# Patient Record
Sex: Female | Born: 1935
Health system: Southern US, Community
[De-identification: ages and names within clinical notes are randomized; demographics above are authoritative.]

## PROBLEM LIST (undated history)

## (undated) DIAGNOSIS — I2699 Other pulmonary embolism without acute cor pulmonale: Secondary | ICD-10-CM

## (undated) DIAGNOSIS — N179 Acute kidney failure, unspecified: Secondary | ICD-10-CM

## (undated) DIAGNOSIS — R1900 Intra-abdominal and pelvic swelling, mass and lump, unspecified site: Secondary | ICD-10-CM

## (undated) DIAGNOSIS — I82409 Acute embolism and thrombosis of unspecified deep veins of unspecified lower extremity: Secondary | ICD-10-CM

## (undated) DIAGNOSIS — A048 Other specified bacterial intestinal infections: Secondary | ICD-10-CM

## (undated) DIAGNOSIS — D509 Iron deficiency anemia, unspecified: Secondary | ICD-10-CM

## (undated) DIAGNOSIS — Z9889 Other specified postprocedural states: Secondary | ICD-10-CM

## (undated) DIAGNOSIS — I4819 Other persistent atrial fibrillation: Secondary | ICD-10-CM

## (undated) DIAGNOSIS — E785 Hyperlipidemia, unspecified: Secondary | ICD-10-CM

## (undated) DIAGNOSIS — C4490 Unspecified malignant neoplasm of skin, unspecified: Secondary | ICD-10-CM

## (undated) DIAGNOSIS — I4891 Unspecified atrial fibrillation: Secondary | ICD-10-CM

## (undated) DIAGNOSIS — I639 Cerebral infarction, unspecified: Secondary | ICD-10-CM

## (undated) DIAGNOSIS — K219 Gastro-esophageal reflux disease without esophagitis: Secondary | ICD-10-CM

## (undated) DIAGNOSIS — M1990 Unspecified osteoarthritis, unspecified site: Secondary | ICD-10-CM

## (undated) DIAGNOSIS — K5790 Diverticulosis of intestine, part unspecified, without perforation or abscess without bleeding: Secondary | ICD-10-CM

## (undated) DIAGNOSIS — K635 Polyp of colon: Secondary | ICD-10-CM

## (undated) DIAGNOSIS — K5190 Ulcerative colitis, unspecified, without complications: Secondary | ICD-10-CM

## (undated) DIAGNOSIS — R059 Cough, unspecified: Secondary | ICD-10-CM

## (undated) DIAGNOSIS — I10 Essential (primary) hypertension: Secondary | ICD-10-CM

## (undated) DIAGNOSIS — E039 Hypothyroidism, unspecified: Secondary | ICD-10-CM

## (undated) DIAGNOSIS — R05 Cough: Secondary | ICD-10-CM

## (undated) DIAGNOSIS — Z8719 Personal history of other diseases of the digestive system: Secondary | ICD-10-CM

## (undated) HISTORY — DX: Unspecified osteoarthritis, unspecified site: M19.90

## (undated) HISTORY — DX: Intra-abdominal and pelvic swelling, mass and lump, unspecified site: R19.00

## (undated) HISTORY — DX: Hyperlipidemia, unspecified: E78.5

## (undated) HISTORY — DX: Cerebral infarction, unspecified: I63.9

## (undated) HISTORY — PX: ORIF ANKLE FRACTURE: SUR919

## (undated) HISTORY — DX: Unspecified atrial fibrillation: I48.91

## (undated) HISTORY — DX: Gastro-esophageal reflux disease without esophagitis: K21.9

## (undated) HISTORY — DX: Iron deficiency anemia, unspecified: D50.9

## (undated) HISTORY — PX: REPLACEMENT TOTAL KNEE: SUR1224

## (undated) HISTORY — PX: OTHER SURGICAL HISTORY: SHX169

## (undated) HISTORY — DX: Polyp of colon: K63.5

## (undated) HISTORY — DX: Essential (primary) hypertension: I10

## (undated) HISTORY — PX: DILATION AND CURETTAGE OF UTERUS: SHX78

## (undated) HISTORY — DX: Other specified bacterial intestinal infections: A04.8

## (undated) HISTORY — DX: Diverticulosis of intestine, part unspecified, without perforation or abscess without bleeding: K57.90

## (undated) HISTORY — DX: Acute embolism and thrombosis of unspecified deep veins of unspecified lower extremity: I82.409

## (undated) HISTORY — DX: Other persistent atrial fibrillation: I48.19

## (undated) HISTORY — DX: Other pulmonary embolism without acute cor pulmonale: I26.99

## (undated) HISTORY — DX: Other specified postprocedural states: Z98.890

## (undated) HISTORY — PX: TONSILLECTOMY: SUR1361

## (undated) HISTORY — DX: Personal history of other diseases of the digestive system: Z87.19

## (undated) HISTORY — PX: APPENDECTOMY: SHX54

## (undated) HISTORY — PX: BREAST LUMPECTOMY: SHX2

## (undated) HISTORY — DX: Unspecified malignant neoplasm of skin, unspecified: C44.90

## (undated) HISTORY — PX: JOINT REPLACEMENT: SHX530

---

## 1990-02-11 DIAGNOSIS — Z8719 Personal history of other diseases of the digestive system: Secondary | ICD-10-CM

## 1990-02-11 HISTORY — PX: BREAST EXCISIONAL BIOPSY: SUR124

## 1990-02-11 HISTORY — DX: Personal history of other diseases of the digestive system: Z87.19

## 1996-01-20 DIAGNOSIS — A048 Other specified bacterial intestinal infections: Secondary | ICD-10-CM

## 1996-01-20 HISTORY — DX: Other specified bacterial intestinal infections: A04.8

## 1997-07-11 ENCOUNTER — Encounter: Admission: RE | Admit: 1997-07-11 | Discharge: 1997-07-11 | Payer: Self-pay | Admitting: Sports Medicine

## 1997-07-14 ENCOUNTER — Encounter: Admission: RE | Admit: 1997-07-14 | Discharge: 1997-07-14 | Payer: Self-pay | Admitting: Family Medicine

## 1997-07-14 ENCOUNTER — Ambulatory Visit: Admission: RE | Admit: 1997-07-14 | Discharge: 1997-07-14 | Payer: Self-pay | Admitting: Family Medicine

## 1998-02-15 ENCOUNTER — Ambulatory Visit (HOSPITAL_COMMUNITY): Admission: RE | Admit: 1998-02-15 | Discharge: 1998-02-15 | Payer: Self-pay

## 1998-02-20 ENCOUNTER — Encounter: Admission: RE | Admit: 1998-02-20 | Discharge: 1998-02-20 | Payer: Self-pay | Admitting: Family Medicine

## 1998-08-14 ENCOUNTER — Encounter: Admission: RE | Admit: 1998-08-14 | Discharge: 1998-08-14 | Payer: Self-pay | Admitting: Family Medicine

## 1998-11-10 ENCOUNTER — Encounter: Admission: RE | Admit: 1998-11-10 | Discharge: 1998-11-10 | Payer: Self-pay | Admitting: Family Medicine

## 1998-11-10 ENCOUNTER — Other Ambulatory Visit: Admission: RE | Admit: 1998-11-10 | Discharge: 1998-11-10 | Payer: Self-pay | Admitting: Family Medicine

## 1999-01-12 ENCOUNTER — Encounter: Admission: RE | Admit: 1999-01-12 | Discharge: 1999-01-12 | Payer: Self-pay | Admitting: Family Medicine

## 1999-02-26 ENCOUNTER — Encounter: Admission: RE | Admit: 1999-02-26 | Discharge: 1999-02-26 | Payer: Self-pay | Admitting: Family Medicine

## 1999-02-27 ENCOUNTER — Ambulatory Visit (HOSPITAL_COMMUNITY): Admission: RE | Admit: 1999-02-27 | Discharge: 1999-02-27 | Payer: Self-pay | Admitting: Family Medicine

## 1999-05-29 ENCOUNTER — Encounter: Payer: Self-pay | Admitting: Family Medicine

## 1999-05-29 ENCOUNTER — Ambulatory Visit (HOSPITAL_COMMUNITY): Admission: RE | Admit: 1999-05-29 | Discharge: 1999-05-29 | Payer: Self-pay | Admitting: Family Medicine

## 1999-07-05 ENCOUNTER — Ambulatory Visit (HOSPITAL_COMMUNITY): Admission: RE | Admit: 1999-07-05 | Discharge: 1999-07-05 | Payer: Self-pay | Admitting: Obstetrics and Gynecology

## 1999-07-05 ENCOUNTER — Encounter (INDEPENDENT_AMBULATORY_CARE_PROVIDER_SITE_OTHER): Payer: Self-pay | Admitting: Specialist

## 1999-09-11 ENCOUNTER — Encounter: Admission: RE | Admit: 1999-09-11 | Discharge: 1999-09-11 | Payer: Self-pay | Admitting: Family Medicine

## 1999-09-17 ENCOUNTER — Encounter: Admission: RE | Admit: 1999-09-17 | Discharge: 1999-09-17 | Payer: Self-pay | Admitting: Family Medicine

## 1999-09-19 ENCOUNTER — Encounter: Admission: RE | Admit: 1999-09-19 | Discharge: 1999-09-19 | Payer: Self-pay | Admitting: Family Medicine

## 1999-09-24 ENCOUNTER — Encounter: Admission: RE | Admit: 1999-09-24 | Discharge: 1999-09-24 | Payer: Self-pay | Admitting: Family Medicine

## 1999-10-08 ENCOUNTER — Encounter: Admission: RE | Admit: 1999-10-08 | Discharge: 1999-10-08 | Payer: Self-pay | Admitting: Family Medicine

## 2000-01-21 ENCOUNTER — Encounter: Admission: RE | Admit: 2000-01-21 | Discharge: 2000-01-21 | Payer: Self-pay | Admitting: Family Medicine

## 2000-02-29 ENCOUNTER — Encounter: Admission: RE | Admit: 2000-02-29 | Discharge: 2000-02-29 | Payer: Self-pay | Admitting: Family Medicine

## 2000-02-29 ENCOUNTER — Encounter: Payer: Self-pay | Admitting: Family Medicine

## 2000-02-29 ENCOUNTER — Ambulatory Visit (HOSPITAL_COMMUNITY): Admission: RE | Admit: 2000-02-29 | Discharge: 2000-02-29 | Payer: Self-pay | Admitting: Family Medicine

## 2000-03-13 ENCOUNTER — Encounter: Admission: RE | Admit: 2000-03-13 | Discharge: 2000-03-13 | Payer: Self-pay | Admitting: Family Medicine

## 2000-04-11 ENCOUNTER — Encounter: Admission: RE | Admit: 2000-04-11 | Discharge: 2000-04-11 | Payer: Self-pay | Admitting: Family Medicine

## 2000-06-04 ENCOUNTER — Encounter: Admission: RE | Admit: 2000-06-04 | Discharge: 2000-06-04 | Payer: Self-pay | Admitting: Family Medicine

## 2000-06-17 ENCOUNTER — Encounter: Admission: RE | Admit: 2000-06-17 | Discharge: 2000-06-17 | Payer: Self-pay | Admitting: Family Medicine

## 2000-07-15 ENCOUNTER — Ambulatory Visit (HOSPITAL_COMMUNITY): Admission: RE | Admit: 2000-07-15 | Discharge: 2000-07-15 | Payer: Self-pay | Admitting: Family Medicine

## 2000-07-15 ENCOUNTER — Ambulatory Visit (HOSPITAL_COMMUNITY): Admission: RE | Admit: 2000-07-15 | Discharge: 2000-07-15 | Payer: Self-pay | Admitting: *Deleted

## 2000-07-15 ENCOUNTER — Inpatient Hospital Stay (HOSPITAL_COMMUNITY): Admission: EM | Admit: 2000-07-15 | Discharge: 2000-07-21 | Payer: Self-pay | Admitting: Emergency Medicine

## 2000-07-15 ENCOUNTER — Encounter: Admission: RE | Admit: 2000-07-15 | Discharge: 2000-07-15 | Payer: Self-pay | Admitting: Family Medicine

## 2000-07-15 ENCOUNTER — Encounter: Payer: Self-pay | Admitting: Family Medicine

## 2000-07-17 ENCOUNTER — Encounter: Payer: Self-pay | Admitting: Family Medicine

## 2000-07-22 ENCOUNTER — Encounter: Admission: RE | Admit: 2000-07-22 | Discharge: 2000-07-22 | Payer: Self-pay | Admitting: Family Medicine

## 2000-07-24 ENCOUNTER — Encounter: Admission: RE | Admit: 2000-07-24 | Discharge: 2000-07-24 | Payer: Self-pay | Admitting: Family Medicine

## 2000-07-31 ENCOUNTER — Encounter: Admission: RE | Admit: 2000-07-31 | Discharge: 2000-07-31 | Payer: Self-pay | Admitting: Sports Medicine

## 2000-08-07 ENCOUNTER — Encounter: Admission: RE | Admit: 2000-08-07 | Discharge: 2000-08-07 | Payer: Self-pay | Admitting: Sports Medicine

## 2000-08-21 ENCOUNTER — Encounter: Admission: RE | Admit: 2000-08-21 | Discharge: 2000-08-21 | Payer: Self-pay | Admitting: Family Medicine

## 2000-08-29 ENCOUNTER — Encounter: Admission: RE | Admit: 2000-08-29 | Discharge: 2000-08-29 | Payer: Self-pay | Admitting: Family Medicine

## 2000-09-12 ENCOUNTER — Encounter: Admission: RE | Admit: 2000-09-12 | Discharge: 2000-09-12 | Payer: Self-pay | Admitting: Family Medicine

## 2000-10-10 ENCOUNTER — Encounter: Admission: RE | Admit: 2000-10-10 | Discharge: 2000-10-10 | Payer: Self-pay | Admitting: Family Medicine

## 2000-10-20 ENCOUNTER — Encounter: Admission: RE | Admit: 2000-10-20 | Discharge: 2000-10-20 | Payer: Self-pay | Admitting: Family Medicine

## 2000-11-03 ENCOUNTER — Encounter: Admission: RE | Admit: 2000-11-03 | Discharge: 2000-11-03 | Payer: Self-pay | Admitting: Family Medicine

## 2000-12-01 ENCOUNTER — Encounter: Admission: RE | Admit: 2000-12-01 | Discharge: 2000-12-01 | Payer: Self-pay | Admitting: Family Medicine

## 2000-12-29 ENCOUNTER — Encounter: Admission: RE | Admit: 2000-12-29 | Discharge: 2000-12-29 | Payer: Self-pay | Admitting: Family Medicine

## 2001-01-23 ENCOUNTER — Encounter: Admission: RE | Admit: 2001-01-23 | Discharge: 2001-01-23 | Payer: Self-pay | Admitting: Family Medicine

## 2001-01-28 ENCOUNTER — Ambulatory Visit (HOSPITAL_COMMUNITY): Admission: RE | Admit: 2001-01-28 | Discharge: 2001-01-28 | Payer: Self-pay | Admitting: Internal Medicine

## 2001-02-27 ENCOUNTER — Encounter: Admission: RE | Admit: 2001-02-27 | Discharge: 2001-02-27 | Payer: Self-pay | Admitting: Family Medicine

## 2001-03-02 ENCOUNTER — Ambulatory Visit (HOSPITAL_COMMUNITY): Admission: RE | Admit: 2001-03-02 | Discharge: 2001-03-02 | Payer: Self-pay | Admitting: Family Medicine

## 2001-03-02 ENCOUNTER — Encounter: Payer: Self-pay | Admitting: Family Medicine

## 2001-06-08 ENCOUNTER — Encounter: Admission: RE | Admit: 2001-06-08 | Discharge: 2001-06-08 | Payer: Self-pay | Admitting: Family Medicine

## 2001-11-24 ENCOUNTER — Encounter: Admission: RE | Admit: 2001-11-24 | Discharge: 2001-11-24 | Payer: Self-pay | Admitting: Family Medicine

## 2002-03-01 ENCOUNTER — Encounter: Admission: RE | Admit: 2002-03-01 | Discharge: 2002-03-01 | Payer: Self-pay | Admitting: Family Medicine

## 2002-03-03 ENCOUNTER — Encounter: Payer: Self-pay | Admitting: Family Medicine

## 2002-03-03 ENCOUNTER — Encounter: Admission: RE | Admit: 2002-03-03 | Discharge: 2002-03-03 | Payer: Self-pay | Admitting: Family Medicine

## 2002-06-11 ENCOUNTER — Encounter: Admission: RE | Admit: 2002-06-11 | Discharge: 2002-06-11 | Payer: Self-pay | Admitting: Family Medicine

## 2002-06-28 ENCOUNTER — Encounter: Admission: RE | Admit: 2002-06-28 | Discharge: 2002-06-28 | Payer: Self-pay | Admitting: Family Medicine

## 2002-09-28 ENCOUNTER — Encounter: Admission: RE | Admit: 2002-09-28 | Discharge: 2002-09-28 | Payer: Self-pay | Admitting: Family Medicine

## 2002-12-08 ENCOUNTER — Encounter: Admission: RE | Admit: 2002-12-08 | Discharge: 2002-12-08 | Payer: Self-pay | Admitting: Family Medicine

## 2003-02-12 ENCOUNTER — Encounter (INDEPENDENT_AMBULATORY_CARE_PROVIDER_SITE_OTHER): Payer: Self-pay | Admitting: *Deleted

## 2003-02-12 LAB — CONVERTED CEMR LAB

## 2003-03-04 ENCOUNTER — Encounter: Admission: RE | Admit: 2003-03-04 | Discharge: 2003-03-04 | Payer: Self-pay | Admitting: Family Medicine

## 2003-03-07 ENCOUNTER — Encounter: Admission: RE | Admit: 2003-03-07 | Discharge: 2003-03-07 | Payer: Self-pay | Admitting: Family Medicine

## 2003-04-25 ENCOUNTER — Encounter: Admission: RE | Admit: 2003-04-25 | Discharge: 2003-04-25 | Payer: Self-pay | Admitting: Family Medicine

## 2003-05-13 ENCOUNTER — Encounter: Admission: RE | Admit: 2003-05-13 | Discharge: 2003-05-13 | Payer: Self-pay | Admitting: Family Medicine

## 2003-11-29 ENCOUNTER — Ambulatory Visit: Payer: Self-pay | Admitting: Family Medicine

## 2004-03-05 ENCOUNTER — Ambulatory Visit: Payer: Self-pay | Admitting: Family Medicine

## 2004-03-07 ENCOUNTER — Encounter: Admission: RE | Admit: 2004-03-07 | Discharge: 2004-03-07 | Payer: Self-pay | Admitting: Family Medicine

## 2004-08-21 ENCOUNTER — Ambulatory Visit: Payer: Self-pay | Admitting: Sports Medicine

## 2004-11-26 ENCOUNTER — Ambulatory Visit: Payer: Self-pay | Admitting: Family Medicine

## 2005-02-26 ENCOUNTER — Ambulatory Visit: Payer: Self-pay | Admitting: Family Medicine

## 2005-03-25 ENCOUNTER — Ambulatory Visit: Payer: Self-pay | Admitting: Family Medicine

## 2005-03-25 ENCOUNTER — Ambulatory Visit (HOSPITAL_COMMUNITY): Admission: RE | Admit: 2005-03-25 | Discharge: 2005-03-25 | Payer: Self-pay | Admitting: Family Medicine

## 2005-03-25 ENCOUNTER — Encounter: Admission: RE | Admit: 2005-03-25 | Discharge: 2005-03-25 | Payer: Self-pay | Admitting: Family Medicine

## 2005-04-15 ENCOUNTER — Ambulatory Visit: Payer: Self-pay | Admitting: Sports Medicine

## 2005-05-01 ENCOUNTER — Inpatient Hospital Stay (HOSPITAL_COMMUNITY): Admission: RE | Admit: 2005-05-01 | Discharge: 2005-05-04 | Payer: Self-pay | Admitting: Orthopedic Surgery

## 2005-11-21 ENCOUNTER — Ambulatory Visit: Payer: Self-pay | Admitting: Family Medicine

## 2005-12-31 ENCOUNTER — Ambulatory Visit: Payer: Self-pay | Admitting: Internal Medicine

## 2006-01-13 ENCOUNTER — Encounter (INDEPENDENT_AMBULATORY_CARE_PROVIDER_SITE_OTHER): Payer: Self-pay | Admitting: *Deleted

## 2006-01-13 ENCOUNTER — Ambulatory Visit: Payer: Self-pay | Admitting: Internal Medicine

## 2006-03-31 ENCOUNTER — Ambulatory Visit: Payer: Self-pay | Admitting: Family Medicine

## 2006-03-31 LAB — CONVERTED CEMR LAB
ALT: 19 units/L (ref 0–35)
AST: 22 units/L (ref 0–37)
Albumin: 4.4 g/dL (ref 3.5–5.2)
Alkaline Phosphatase: 44 units/L (ref 39–117)
BUN: 19 mg/dL (ref 6–23)
CO2: 25 meq/L (ref 19–32)
Calcium: 9.2 mg/dL (ref 8.4–10.5)
Chloride: 98 meq/L (ref 96–112)
Cholesterol: 197 mg/dL (ref 0–200)
Creatinine, Ser: 0.9 mg/dL (ref 0.40–1.20)
Glucose, Bld: 103 mg/dL — ABNORMAL HIGH (ref 70–99)
HDL: 59 mg/dL (ref >39)
LDL Cholesterol: 93 mg/dL (ref 0–99)
Potassium: 3.6 meq/L (ref 3.5–5.3)
Sodium: 135 meq/L (ref 135–145)
Total Bilirubin: 0.4 mg/dL (ref 0.3–1.2)
Total CHOL/HDL Ratio: 3.3
Total Protein: 7.3 g/dL (ref 6.0–8.3)
Triglycerides: 225 mg/dL — ABNORMAL HIGH (ref <150)
VLDL: 45 mg/dL — ABNORMAL HIGH (ref 0–40)

## 2006-04-01 ENCOUNTER — Encounter: Admission: RE | Admit: 2006-04-01 | Discharge: 2006-04-01 | Payer: Self-pay | Admitting: Family Medicine

## 2006-04-10 DIAGNOSIS — I10 Essential (primary) hypertension: Secondary | ICD-10-CM | POA: Insufficient documentation

## 2006-04-10 DIAGNOSIS — D126 Benign neoplasm of colon, unspecified: Secondary | ICD-10-CM | POA: Insufficient documentation

## 2006-04-10 DIAGNOSIS — L570 Actinic keratosis: Secondary | ICD-10-CM | POA: Insufficient documentation

## 2006-04-10 DIAGNOSIS — E7800 Pure hypercholesterolemia, unspecified: Secondary | ICD-10-CM | POA: Insufficient documentation

## 2006-04-10 DIAGNOSIS — M899 Disorder of bone, unspecified: Secondary | ICD-10-CM | POA: Insufficient documentation

## 2006-04-10 DIAGNOSIS — K210 Gastro-esophageal reflux disease with esophagitis: Secondary | ICD-10-CM

## 2006-04-10 DIAGNOSIS — Z86711 Personal history of pulmonary embolism: Secondary | ICD-10-CM | POA: Insufficient documentation

## 2006-04-10 DIAGNOSIS — K5730 Diverticulosis of large intestine without perforation or abscess without bleeding: Secondary | ICD-10-CM | POA: Insufficient documentation

## 2006-04-10 DIAGNOSIS — M949 Disorder of cartilage, unspecified: Secondary | ICD-10-CM

## 2006-04-10 DIAGNOSIS — M159 Polyosteoarthritis, unspecified: Secondary | ICD-10-CM | POA: Insufficient documentation

## 2006-04-11 ENCOUNTER — Encounter (INDEPENDENT_AMBULATORY_CARE_PROVIDER_SITE_OTHER): Payer: Self-pay | Admitting: *Deleted

## 2006-05-08 ENCOUNTER — Ambulatory Visit: Payer: Self-pay | Admitting: Family Medicine

## 2006-05-08 ENCOUNTER — Encounter: Payer: Self-pay | Admitting: Family Medicine

## 2006-05-08 ENCOUNTER — Telehealth (INDEPENDENT_AMBULATORY_CARE_PROVIDER_SITE_OTHER): Payer: Self-pay | Admitting: *Deleted

## 2006-05-26 ENCOUNTER — Telehealth: Payer: Self-pay | Admitting: Family Medicine

## 2006-11-26 ENCOUNTER — Ambulatory Visit: Payer: Self-pay | Admitting: Family Medicine

## 2007-02-12 DIAGNOSIS — I2699 Other pulmonary embolism without acute cor pulmonale: Secondary | ICD-10-CM

## 2007-02-12 DIAGNOSIS — I82409 Acute embolism and thrombosis of unspecified deep veins of unspecified lower extremity: Secondary | ICD-10-CM

## 2007-02-12 HISTORY — DX: Acute embolism and thrombosis of unspecified deep veins of unspecified lower extremity: I82.409

## 2007-02-12 HISTORY — DX: Other pulmonary embolism without acute cor pulmonale: I26.99

## 2007-02-27 ENCOUNTER — Encounter: Payer: Self-pay | Admitting: Family Medicine

## 2007-04-03 ENCOUNTER — Ambulatory Visit: Payer: Self-pay | Admitting: Family Medicine

## 2007-04-03 ENCOUNTER — Encounter: Admission: RE | Admit: 2007-04-03 | Discharge: 2007-04-03 | Payer: Self-pay | Admitting: Family Medicine

## 2007-04-03 DIAGNOSIS — R51 Headache: Secondary | ICD-10-CM | POA: Insufficient documentation

## 2007-04-03 DIAGNOSIS — R519 Headache, unspecified: Secondary | ICD-10-CM | POA: Insufficient documentation

## 2007-04-08 ENCOUNTER — Ambulatory Visit: Payer: Self-pay | Admitting: Family Medicine

## 2007-04-09 LAB — CONVERTED CEMR LAB
ALT: 22 units/L (ref 0–35)
AST: 23 units/L (ref 0–37)
Albumin: 4.5 g/dL (ref 3.5–5.2)
Alkaline Phosphatase: 43 units/L (ref 39–117)
BUN: 20 mg/dL (ref 6–23)
CO2: 25 meq/L (ref 19–32)
Calcium: 9.7 mg/dL (ref 8.4–10.5)
Chloride: 100 meq/L (ref 96–112)
Cholesterol: 214 mg/dL — ABNORMAL HIGH (ref 0–200)
Creatinine, Ser: 1.02 mg/dL (ref 0.40–1.20)
Glucose, Bld: 101 mg/dL — ABNORMAL HIGH (ref 70–99)
HCT: 43.1 % (ref 36.0–46.0)
HDL: 58 mg/dL (ref >39)
Hemoglobin: 14.1 g/dL (ref 12.0–15.0)
LDL Cholesterol: 100 mg/dL — ABNORMAL HIGH (ref 0–99)
MCHC: 32.7 g/dL (ref 30.0–36.0)
MCV: 92.1 fL (ref 78.0–100.0)
Platelets: 209 10*3/uL (ref 150–400)
Potassium: 4.1 meq/L (ref 3.5–5.3)
RBC: 4.68 M/uL (ref 3.87–5.11)
RDW: 13 % (ref 11.5–15.5)
Sodium: 138 meq/L (ref 135–145)
TSH: 5.026 microintl units/mL (ref 0.350–5.50)
Total Bilirubin: 0.6 mg/dL (ref 0.3–1.2)
Total CHOL/HDL Ratio: 3.7
Total Protein: 7.4 g/dL (ref 6.0–8.3)
Triglycerides: 279 mg/dL — ABNORMAL HIGH (ref <150)
VLDL: 56 mg/dL — ABNORMAL HIGH (ref 0–40)
WBC: 4.5 10*3/uL (ref 4.0–10.5)

## 2007-04-17 ENCOUNTER — Telehealth: Payer: Self-pay | Admitting: Family Medicine

## 2007-04-27 ENCOUNTER — Telehealth: Payer: Self-pay | Admitting: Family Medicine

## 2007-05-06 ENCOUNTER — Telehealth: Payer: Self-pay | Admitting: Family Medicine

## 2007-11-03 ENCOUNTER — Ambulatory Visit: Payer: Self-pay | Admitting: Family Medicine

## 2007-12-25 ENCOUNTER — Telehealth: Payer: Self-pay | Admitting: *Deleted

## 2007-12-25 DIAGNOSIS — J329 Chronic sinusitis, unspecified: Secondary | ICD-10-CM | POA: Insufficient documentation

## 2008-01-13 ENCOUNTER — Encounter: Payer: Self-pay | Admitting: Family Medicine

## 2008-01-14 ENCOUNTER — Encounter: Admission: RE | Admit: 2008-01-14 | Discharge: 2008-01-14 | Payer: Self-pay | Admitting: Otolaryngology

## 2008-01-20 ENCOUNTER — Encounter: Admission: RE | Admit: 2008-01-20 | Discharge: 2008-01-20 | Payer: Self-pay | Admitting: Otolaryngology

## 2008-02-08 ENCOUNTER — Encounter: Payer: Self-pay | Admitting: Family Medicine

## 2008-02-19 ENCOUNTER — Encounter (INDEPENDENT_AMBULATORY_CARE_PROVIDER_SITE_OTHER): Payer: Self-pay | Admitting: Otolaryngology

## 2008-02-19 ENCOUNTER — Ambulatory Visit (HOSPITAL_COMMUNITY): Admission: RE | Admit: 2008-02-19 | Discharge: 2008-02-19 | Payer: Self-pay | Admitting: Otolaryngology

## 2008-03-04 ENCOUNTER — Ambulatory Visit: Payer: Self-pay | Admitting: Family Medicine

## 2008-03-07 ENCOUNTER — Telehealth (INDEPENDENT_AMBULATORY_CARE_PROVIDER_SITE_OTHER): Payer: Self-pay | Admitting: *Deleted

## 2008-03-08 ENCOUNTER — Encounter: Payer: Self-pay | Admitting: Family Medicine

## 2008-03-14 ENCOUNTER — Telehealth (INDEPENDENT_AMBULATORY_CARE_PROVIDER_SITE_OTHER): Payer: Self-pay | Admitting: *Deleted

## 2008-04-11 ENCOUNTER — Encounter: Admission: RE | Admit: 2008-04-11 | Discharge: 2008-04-11 | Payer: Self-pay | Admitting: Family Medicine

## 2008-04-11 ENCOUNTER — Encounter: Payer: Self-pay | Admitting: Family Medicine

## 2008-04-11 ENCOUNTER — Ambulatory Visit: Payer: Self-pay | Admitting: Family Medicine

## 2008-04-11 LAB — CONVERTED CEMR LAB
ALT: 18 units/L (ref 0–35)
AST: 20 units/L (ref 0–37)
Albumin: 4.6 g/dL (ref 3.5–5.2)
Alkaline Phosphatase: 46 units/L (ref 39–117)
BUN: 14 mg/dL (ref 6–23)
CO2: 20 meq/L (ref 19–32)
Calcium: 9.6 mg/dL (ref 8.4–10.5)
Chloride: 97 meq/L (ref 96–112)
Cholesterol: 208 mg/dL — ABNORMAL HIGH (ref 0–200)
Creatinine, Ser: 0.94 mg/dL (ref 0.40–1.20)
Glucose, Bld: 97 mg/dL (ref 70–99)
HCT: 38.9 % (ref 36.0–46.0)
HDL: 64 mg/dL (ref >39)
Hemoglobin: 13.4 g/dL (ref 12.0–15.0)
LDL Cholesterol: 106 mg/dL — ABNORMAL HIGH (ref 0–99)
MCHC: 34.4 g/dL (ref 30.0–36.0)
MCV: 86.3 fL (ref 78.0–100.0)
Platelets: 254 10*3/uL (ref 150–400)
Potassium: 4 meq/L (ref 3.5–5.3)
RBC: 4.51 M/uL (ref 3.87–5.11)
RDW: 13.4 % (ref 11.5–15.5)
Sed Rate: 7 mm/hr (ref 0–22)
Sodium: 135 meq/L (ref 135–145)
TSH: 3.335 microintl units/mL (ref 0.350–4.50)
Total Bilirubin: 0.5 mg/dL (ref 0.3–1.2)
Total CHOL/HDL Ratio: 3.3
Total Protein: 7.3 g/dL (ref 6.0–8.3)
Triglycerides: 190 mg/dL — ABNORMAL HIGH (ref <150)
VLDL: 38 mg/dL (ref 0–40)
Vit D, 25-Hydroxy: 52 ng/mL (ref 30–89)
WBC: 5 10*3/uL (ref 4.0–10.5)

## 2008-04-13 ENCOUNTER — Ambulatory Visit: Payer: Self-pay | Admitting: Family Medicine

## 2008-08-18 ENCOUNTER — Encounter: Payer: Self-pay | Admitting: Family Medicine

## 2008-08-18 ENCOUNTER — Ambulatory Visit: Payer: Self-pay | Admitting: Family Medicine

## 2008-08-18 LAB — CONVERTED CEMR LAB
ALT: 19 units/L (ref 0–35)
AST: 21 units/L (ref 0–37)
Albumin: 4.4 g/dL (ref 3.5–5.2)
Alkaline Phosphatase: 44 units/L (ref 39–117)
BUN: 13 mg/dL (ref 6–23)
CO2: 21 meq/L (ref 19–32)
Calcium: 9.4 mg/dL (ref 8.4–10.5)
Chloride: 99 meq/L (ref 96–112)
Creatinine, Ser: 1.03 mg/dL (ref 0.40–1.20)
Direct LDL: 85 mg/dL
Glucose, Bld: 99 mg/dL (ref 70–99)
Potassium: 4 meq/L (ref 3.5–5.3)
Sodium: 134 meq/L — ABNORMAL LOW (ref 135–145)
Total Bilirubin: 0.4 mg/dL (ref 0.3–1.2)
Total Protein: 7.2 g/dL (ref 6.0–8.3)

## 2008-09-06 ENCOUNTER — Encounter: Payer: Self-pay | Admitting: Family Medicine

## 2008-11-16 ENCOUNTER — Ambulatory Visit: Payer: Self-pay | Admitting: Family Medicine

## 2008-11-28 ENCOUNTER — Telehealth: Payer: Self-pay | Admitting: *Deleted

## 2008-11-30 ENCOUNTER — Encounter: Admission: RE | Admit: 2008-11-30 | Discharge: 2008-11-30 | Payer: Self-pay | Admitting: Family Medicine

## 2009-04-13 ENCOUNTER — Encounter: Admission: RE | Admit: 2009-04-13 | Discharge: 2009-04-13 | Payer: Self-pay | Admitting: Family Medicine

## 2009-04-19 ENCOUNTER — Ambulatory Visit: Payer: Self-pay | Admitting: Family Medicine

## 2009-05-02 ENCOUNTER — Ambulatory Visit: Payer: Self-pay | Admitting: Family Medicine

## 2009-05-02 LAB — CONVERTED CEMR LAB
ALT: 13 units/L (ref 0–35)
AST: 18 units/L (ref 0–37)
Albumin: 4.3 g/dL (ref 3.5–5.2)
Alkaline Phosphatase: 44 units/L (ref 39–117)
BUN: 15 mg/dL (ref 6–23)
CO2: 23 meq/L (ref 19–32)
Calcium: 9.2 mg/dL (ref 8.4–10.5)
Chloride: 100 meq/L (ref 96–112)
Cholesterol: 160 mg/dL (ref 0–200)
Creatinine, Ser: 1.22 mg/dL — ABNORMAL HIGH (ref 0.40–1.20)
Glucose, Bld: 102 mg/dL — ABNORMAL HIGH (ref 70–99)
HDL: 63 mg/dL (ref >39)
LDL Cholesterol: 69 mg/dL (ref 0–99)
Potassium: 4.2 meq/L (ref 3.5–5.3)
Sodium: 135 meq/L (ref 135–145)
Total Bilirubin: 0.3 mg/dL (ref 0.3–1.2)
Total CHOL/HDL Ratio: 2.5
Total Protein: 6.8 g/dL (ref 6.0–8.3)
Triglycerides: 139 mg/dL (ref <150)
VLDL: 28 mg/dL (ref 0–40)

## 2009-05-15 ENCOUNTER — Encounter: Payer: Self-pay | Admitting: Family Medicine

## 2009-07-05 ENCOUNTER — Ambulatory Visit: Payer: Self-pay | Admitting: Family Medicine

## 2009-07-05 LAB — CONVERTED CEMR LAB
ALT: 14 units/L (ref 0–35)
AST: 23 units/L (ref 0–37)
Albumin: 4.6 g/dL (ref 3.5–5.2)
Alkaline Phosphatase: 50 units/L (ref 39–117)
BUN: 19 mg/dL (ref 6–23)
CO2: 21 meq/L (ref 19–32)
Calcium: 9.1 mg/dL (ref 8.4–10.5)
Chloride: 96 meq/L (ref 96–112)
Creatinine, Ser: 1.16 mg/dL (ref 0.40–1.20)
Glucose, Bld: 94 mg/dL (ref 70–99)
HCT: 21.4 % — ABNORMAL LOW (ref 36.0–46.0)
Hemoglobin: 6 g/dL — CL (ref 12.0–15.0)
MCHC: 28 g/dL — ABNORMAL LOW (ref 30.0–36.0)
MCV: 61 fL — ABNORMAL LOW (ref 78.0–100.0)
Magnesium: 1.8 mg/dL (ref 1.5–2.5)
Platelets: 333 10*3/uL (ref 150–400)
Potassium: 4.1 meq/L (ref 3.5–5.3)
RBC: 3.51 M/uL — ABNORMAL LOW (ref 3.87–5.11)
RDW: 16.8 % — ABNORMAL HIGH (ref 11.5–15.5)
Sodium: 131 meq/L — ABNORMAL LOW (ref 135–145)
TSH: 3.475 microintl units/mL (ref 0.350–4.500)
Total Bilirubin: 0.4 mg/dL (ref 0.3–1.2)
Total Protein: 7.6 g/dL (ref 6.0–8.3)
WBC: 6.6 10*3/uL (ref 4.0–10.5)

## 2009-07-06 ENCOUNTER — Inpatient Hospital Stay (HOSPITAL_COMMUNITY): Admission: EM | Admit: 2009-07-06 | Discharge: 2009-07-07 | Payer: Self-pay | Admitting: Emergency Medicine

## 2009-07-06 ENCOUNTER — Encounter: Payer: Self-pay | Admitting: Family Medicine

## 2009-07-06 ENCOUNTER — Ambulatory Visit: Payer: Self-pay | Admitting: Family Medicine

## 2009-07-06 ENCOUNTER — Telehealth: Payer: Self-pay | Admitting: *Deleted

## 2009-07-07 ENCOUNTER — Encounter: Payer: Self-pay | Admitting: *Deleted

## 2009-07-07 ENCOUNTER — Telehealth: Payer: Self-pay | Admitting: Family Medicine

## 2009-07-07 ENCOUNTER — Telehealth: Payer: Self-pay | Admitting: Internal Medicine

## 2009-07-07 ENCOUNTER — Encounter: Payer: Self-pay | Admitting: Family Medicine

## 2009-07-07 DIAGNOSIS — D500 Iron deficiency anemia secondary to blood loss (chronic): Secondary | ICD-10-CM | POA: Insufficient documentation

## 2009-07-11 ENCOUNTER — Encounter: Payer: Self-pay | Admitting: Internal Medicine

## 2009-07-11 ENCOUNTER — Ambulatory Visit: Payer: Self-pay | Admitting: Gastroenterology

## 2009-07-11 DIAGNOSIS — Z860100 Personal history of colon polyps, unspecified: Secondary | ICD-10-CM | POA: Insufficient documentation

## 2009-07-11 DIAGNOSIS — E785 Hyperlipidemia, unspecified: Secondary | ICD-10-CM | POA: Insufficient documentation

## 2009-07-11 DIAGNOSIS — I82409 Acute embolism and thrombosis of unspecified deep veins of unspecified lower extremity: Secondary | ICD-10-CM | POA: Insufficient documentation

## 2009-07-11 DIAGNOSIS — I10 Essential (primary) hypertension: Secondary | ICD-10-CM | POA: Insufficient documentation

## 2009-07-11 DIAGNOSIS — D509 Iron deficiency anemia, unspecified: Secondary | ICD-10-CM | POA: Insufficient documentation

## 2009-07-11 DIAGNOSIS — K219 Gastro-esophageal reflux disease without esophagitis: Secondary | ICD-10-CM | POA: Insufficient documentation

## 2009-07-12 LAB — CONVERTED CEMR LAB
Basophils Absolute: 0.1 10*3/uL (ref 0.0–0.1)
Basophils Relative: 1 % (ref 0.0–3.0)
Eosinophils Absolute: 0.2 10*3/uL (ref 0.0–0.7)
Eosinophils Relative: 2.6 % (ref 0.0–5.0)
HCT: 29.1 % — ABNORMAL LOW (ref 36.0–46.0)
Hemoglobin: 9.4 g/dL — ABNORMAL LOW (ref 12.0–15.0)
Lymphocytes Relative: 20.2 % (ref 12.0–46.0)
Lymphs Abs: 1.6 10*3/uL (ref 0.7–4.0)
MCHC: 32.4 g/dL (ref 30.0–36.0)
MCV: 67.5 fL — ABNORMAL LOW (ref 78.0–100.0)
Monocytes Absolute: 0.8 10*3/uL (ref 0.1–1.0)
Monocytes Relative: 9.7 % (ref 3.0–12.0)
Neutro Abs: 5.2 10*3/uL (ref 1.4–7.7)
Neutrophils Relative %: 66.5 % (ref 43.0–77.0)
Platelets: 370 10*3/uL (ref 150.0–400.0)
RBC: 4.31 M/uL (ref 3.87–5.11)
RDW: 29.3 % — ABNORMAL HIGH (ref 11.5–14.6)
WBC: 7.8 10*3/uL (ref 4.5–10.5)

## 2009-07-13 ENCOUNTER — Encounter: Admission: RE | Admit: 2009-07-13 | Discharge: 2009-07-13 | Payer: Self-pay | Admitting: Family Medicine

## 2009-07-14 DIAGNOSIS — M51379 Other intervertebral disc degeneration, lumbosacral region without mention of lumbar back pain or lower extremity pain: Secondary | ICD-10-CM | POA: Insufficient documentation

## 2009-07-14 DIAGNOSIS — M5137 Other intervertebral disc degeneration, lumbosacral region: Secondary | ICD-10-CM | POA: Insufficient documentation

## 2009-07-21 ENCOUNTER — Ambulatory Visit: Payer: Self-pay | Admitting: Internal Medicine

## 2009-07-21 ENCOUNTER — Telehealth: Payer: Self-pay | Admitting: Internal Medicine

## 2009-07-24 ENCOUNTER — Telehealth: Payer: Self-pay | Admitting: Family Medicine

## 2009-07-25 ENCOUNTER — Encounter: Payer: Self-pay | Admitting: Internal Medicine

## 2009-07-27 ENCOUNTER — Encounter: Payer: Self-pay | Admitting: Internal Medicine

## 2009-07-27 ENCOUNTER — Ambulatory Visit: Payer: Self-pay | Admitting: Internal Medicine

## 2009-08-02 ENCOUNTER — Telehealth: Payer: Self-pay | Admitting: Internal Medicine

## 2009-08-02 ENCOUNTER — Ambulatory Visit (HOSPITAL_COMMUNITY): Admission: RE | Admit: 2009-08-02 | Discharge: 2009-08-02 | Payer: Self-pay | Admitting: Internal Medicine

## 2009-08-04 ENCOUNTER — Telehealth: Payer: Self-pay | Admitting: Internal Medicine

## 2009-08-07 ENCOUNTER — Telehealth: Payer: Self-pay | Admitting: Family Medicine

## 2009-08-07 ENCOUNTER — Ambulatory Visit: Payer: Self-pay | Admitting: Internal Medicine

## 2009-08-07 LAB — CONVERTED CEMR LAB
BUN: 18 mg/dL
Creatinine, Ser: 1 mg/dL

## 2009-08-10 ENCOUNTER — Ambulatory Visit (HOSPITAL_COMMUNITY): Admission: RE | Admit: 2009-08-10 | Discharge: 2009-08-10 | Payer: Self-pay | Admitting: Internal Medicine

## 2009-08-11 ENCOUNTER — Telehealth: Payer: Self-pay | Admitting: Family Medicine

## 2009-08-11 DIAGNOSIS — R1900 Intra-abdominal and pelvic swelling, mass and lump, unspecified site: Secondary | ICD-10-CM | POA: Insufficient documentation

## 2009-08-15 ENCOUNTER — Telehealth: Payer: Self-pay | Admitting: Internal Medicine

## 2009-08-15 ENCOUNTER — Telehealth: Payer: Self-pay | Admitting: Family Medicine

## 2009-08-16 ENCOUNTER — Ambulatory Visit (HOSPITAL_COMMUNITY): Admission: RE | Admit: 2009-08-16 | Discharge: 2009-08-16 | Payer: Self-pay | Admitting: Family Medicine

## 2009-08-16 ENCOUNTER — Encounter: Payer: Self-pay | Admitting: Family Medicine

## 2009-08-17 ENCOUNTER — Telehealth: Payer: Self-pay | Admitting: Family Medicine

## 2009-08-17 ENCOUNTER — Telehealth: Payer: Self-pay | Admitting: Internal Medicine

## 2009-08-17 ENCOUNTER — Ambulatory Visit: Payer: Self-pay | Admitting: Obstetrics and Gynecology

## 2009-08-17 LAB — CONVERTED CEMR LAB: CA 125: 7.7 units/mL (ref 0.0–30.2)

## 2009-08-18 ENCOUNTER — Telehealth: Payer: Self-pay | Admitting: Family Medicine

## 2009-08-18 ENCOUNTER — Encounter (INDEPENDENT_AMBULATORY_CARE_PROVIDER_SITE_OTHER): Payer: Self-pay | Admitting: *Deleted

## 2009-08-18 ENCOUNTER — Ambulatory Visit: Payer: Self-pay | Admitting: Internal Medicine

## 2009-08-22 ENCOUNTER — Ambulatory Visit: Payer: Self-pay | Admitting: Internal Medicine

## 2009-08-22 LAB — CONVERTED CEMR LAB
Basophils Absolute: 0 10*3/uL (ref 0.0–0.1)
Basophils Relative: 0.9 % (ref 0.0–3.0)
Eosinophils Absolute: 0.1 10*3/uL (ref 0.0–0.7)
Eosinophils Relative: 2.1 % (ref 0.0–5.0)
HCT: 28.5 % — ABNORMAL LOW (ref 36.0–46.0)
Hemoglobin: 9.7 g/dL — ABNORMAL LOW (ref 12.0–15.0)
Lymphocytes Relative: 16.7 % (ref 12.0–46.0)
Lymphs Abs: 0.9 10*3/uL (ref 0.7–4.0)
MCHC: 34.1 g/dL (ref 30.0–36.0)
MCV: 78.1 fL (ref 78.0–100.0)
Monocytes Absolute: 0.4 10*3/uL (ref 0.1–1.0)
Monocytes Relative: 7.4 % (ref 3.0–12.0)
Neutro Abs: 3.8 10*3/uL (ref 1.4–7.7)
Neutrophils Relative %: 72.9 % (ref 43.0–77.0)
Platelets: 241 10*3/uL (ref 150.0–400.0)
RBC: 3.66 M/uL — ABNORMAL LOW (ref 3.87–5.11)
RDW: 31.9 % — ABNORMAL HIGH (ref 11.5–14.6)
WBC: 5.2 10*3/uL (ref 4.5–10.5)

## 2009-08-23 ENCOUNTER — Ambulatory Visit: Payer: Self-pay | Admitting: Oncology

## 2009-08-23 ENCOUNTER — Telehealth: Payer: Self-pay | Admitting: Family Medicine

## 2009-08-24 ENCOUNTER — Telehealth: Payer: Self-pay | Admitting: Internal Medicine

## 2009-09-22 ENCOUNTER — Encounter: Payer: Self-pay | Admitting: Family Medicine

## 2009-09-22 ENCOUNTER — Ambulatory Visit: Payer: Self-pay | Admitting: Family Medicine

## 2009-09-22 LAB — CONVERTED CEMR LAB
ALT: 14 units/L (ref 0–35)
AST: 16 units/L (ref 0–37)
Albumin: 4.3 g/dL (ref 3.5–5.2)
Alkaline Phosphatase: 45 units/L (ref 39–117)
BUN: 12 mg/dL (ref 6–23)
CO2: 21 meq/L (ref 19–32)
Calcium: 9.3 mg/dL (ref 8.4–10.5)
Chloride: 98 meq/L (ref 96–112)
Creatinine, Ser: 1 mg/dL (ref 0.40–1.20)
Ferritin: 25 ng/mL (ref 10–291)
Glucose, Bld: 112 mg/dL — ABNORMAL HIGH (ref 70–99)
HCT: 35.3 % — ABNORMAL LOW (ref 36.0–46.0)
Hemoglobin: 11.8 g/dL — ABNORMAL LOW (ref 12.0–15.0)
Iron: 42 ug/dL (ref 42–145)
MCHC: 33.4 g/dL (ref 30.0–36.0)
MCV: 84.2 fL (ref 78.0–100.0)
Platelets: 293 10*3/uL (ref 150–400)
Potassium: 3.7 meq/L (ref 3.5–5.3)
RBC: 4.19 M/uL (ref 3.87–5.11)
RDW: 20.5 % — ABNORMAL HIGH (ref 11.5–15.5)
Saturation Ratios: 15 % — ABNORMAL LOW (ref 20–55)
Sodium: 133 meq/L — ABNORMAL LOW (ref 135–145)
TIBC: 281 ug/dL (ref 250–470)
Total Bilirubin: 0.3 mg/dL (ref 0.3–1.2)
Total Protein: 6.5 g/dL (ref 6.0–8.3)
UIBC: 239 ug/dL
WBC: 7 10*3/uL (ref 4.0–10.5)

## 2009-10-18 ENCOUNTER — Telehealth: Payer: Self-pay | Admitting: Family Medicine

## 2009-11-20 ENCOUNTER — Ambulatory Visit: Payer: Self-pay | Admitting: Family Medicine

## 2009-11-20 DIAGNOSIS — R634 Abnormal weight loss: Secondary | ICD-10-CM | POA: Insufficient documentation

## 2009-11-21 LAB — CONVERTED CEMR LAB
ALT: 16 units/L (ref 0–35)
AST: 20 units/L (ref 0–37)
Albumin: 4.4 g/dL (ref 3.5–5.2)
Alkaline Phosphatase: 46 units/L (ref 39–117)
BUN: 17 mg/dL (ref 6–23)
CO2: 24 meq/L (ref 19–32)
Calcium: 9.4 mg/dL (ref 8.4–10.5)
Chloride: 94 meq/L — ABNORMAL LOW (ref 96–112)
Creatinine, Ser: 0.88 mg/dL (ref 0.40–1.20)
Glucose, Bld: 110 mg/dL — ABNORMAL HIGH (ref 70–99)
HCT: 35.7 % — ABNORMAL LOW (ref 36.0–46.0)
Hemoglobin: 12.1 g/dL (ref 12.0–15.0)
MCHC: 33.9 g/dL (ref 30.0–36.0)
MCV: 89.5 fL (ref 78.0–100.0)
Platelets: 241 10*3/uL (ref 150–400)
Potassium: 3.9 meq/L (ref 3.5–5.3)
RBC: 3.99 M/uL (ref 3.87–5.11)
RDW: 13.9 % (ref 11.5–15.5)
Sodium: 130 meq/L — ABNORMAL LOW (ref 135–145)
Total Bilirubin: 0.4 mg/dL (ref 0.3–1.2)
Total Protein: 6.7 g/dL (ref 6.0–8.3)
WBC: 5.6 10*3/uL (ref 4.0–10.5)

## 2010-01-12 ENCOUNTER — Telehealth: Payer: Self-pay | Admitting: Family Medicine

## 2010-02-15 ENCOUNTER — Telehealth: Payer: Self-pay | Admitting: *Deleted

## 2010-02-19 ENCOUNTER — Ambulatory Visit: Admission: RE | Admit: 2010-02-19 | Discharge: 2010-02-19 | Payer: Self-pay | Source: Home / Self Care

## 2010-02-19 ENCOUNTER — Encounter
Admission: RE | Admit: 2010-02-19 | Discharge: 2010-02-19 | Payer: Self-pay | Source: Home / Self Care | Attending: Family Medicine | Admitting: Family Medicine

## 2010-02-19 ENCOUNTER — Encounter: Payer: Self-pay | Admitting: Family Medicine

## 2010-02-19 LAB — CONVERTED CEMR LAB
HCT: 35.1 % — ABNORMAL LOW (ref 36.0–46.0)
Hemoglobin: 11.8 g/dL — ABNORMAL LOW (ref 12.0–15.0)
MCHC: 33.6 g/dL (ref 30.0–36.0)
MCV: 91.2 fL (ref 78.0–100.0)
Platelets: 263 10*3/uL (ref 150–400)
RBC: 3.85 M/uL — ABNORMAL LOW (ref 3.87–5.11)
RDW: 13 % (ref 11.5–15.5)
WBC: 7.3 10*3/uL (ref 4.0–10.5)

## 2010-03-06 ENCOUNTER — Other Ambulatory Visit: Payer: Self-pay | Admitting: Family Medicine

## 2010-03-06 DIAGNOSIS — Z1239 Encounter for other screening for malignant neoplasm of breast: Secondary | ICD-10-CM

## 2010-03-13 NOTE — Progress Notes (Signed)
Summary: phn msg   Phone Note Call from Patient Call back at [facility] Phone [phone]   Caller: Patient Summary of Call: Dr [name] has recommended a Enteroscopy Tuesday at 3:30.  If Dr. Andria Frames does not want her to do this please let her know.  Also wanting to see if he got the results of her CA-125. Initial call taken by: Raymond Gurney,  August 18, 2009 9:08 AM  Follow-up for Phone Call        to PCP Follow-up by: Isabelle Course,  August 18, 2009 10:33 AM  Additional Follow-up for Phone Call Additional follow up Details #1::        called & LM that Ca125 normal.  Agreed with endoscopy Additional Follow-up by: Madison Hickman MD,  August 18, 2009 12:29 PM

## 2010-03-13 NOTE — Progress Notes (Signed)
Summary: Triage  Phone Note Call from Patient Call back at Home Phone [phone]   Reason for Call: Talk to Nurse Summary of Call: Pt would like to speak with a nurse about her bp continuesly rising. Initial call taken by: Drucie Ip,  April 17, 2007 8:37 AM  Follow-up for Phone Call        160/90 range at home. has "pressure over one eye & top of head" same as symptoms she was seen for 04/03/07 message to pcp & will call her back when md responds Follow-up by: Elige Radon RN,  April 17, 2007 8:51 AM  Additional Follow-up for Phone Call Additional follow up Details #1::        Called.  No change in BP with increase of benezapril.  Will increase HCTZ and monitor Additional Follow-up by: Madison Hickman MD,  April 17, 2007 11:20 AM    New/Updated Medications: HYDROCHLOROTHIAZIDE 25 MG  TABS (HYDROCHLOROTHIAZIDE) one by mouth daily   Prescriptions: HYDROCHLOROTHIAZIDE 25 MG  TABS (HYDROCHLOROTHIAZIDE) one by mouth daily  #90 x 3   Entered and Authorized by:   Madison Hickman MD   Signed by:   Madison Hickman MD on 04/17/2007   Method used:   Print then Give to Patient   RxID:   [id number]

## 2010-03-13 NOTE — Assessment & Plan Note (Signed)
Summary: office visit & flu shot/bmc   Vital Signs:  Patient profile:   77 year old female Height:      62.5 inches Weight:      176.2 pounds BMI:     31.83 Pulse rate:   80 / minute BP sitting:   133 / 76  (right arm)  Vitals Entered By: Mauricia Area CMA, (November 20, 2009 8:50 AM) CC: f/up GI bleed and labs. Is Patient Diabetic? No Pain Assessment Patient in pain? no        Primary Care Provider:  Madison Hickman MD  CC:  f/up GI bleed and labs..  History of Present Illness: Husband died of pancreatic cancer after a very rapid decline  ~ 2 months ago.  Grief is real and seems normal.  Generally coping.  Has good support.    Recheck CBC.  Known chronic GI blood loss from small bowel polyp seen on capsule endoscopy but unable to be reached by endoscope.  Possible referral to [facility] for another attempt.  Hypertension - continues on meds.  Has had some slow, unexplain wt loss that predates her grief.  no symptoms.  Habits & Providers  Alcohol-Tobacco-Diet     Tobacco Status: quit > 6 months     Year Quit: 1963  Current Medications (verified): 1)  Benazepril Hcl 40 Mg  Tabs (Benazepril Hcl) .... One Tab Daily 2)  Glucosamine Sulfate 500 Mg Tabs (Glucosamine Sulfate) .... Take 1 Tablet By Mouth Twice A Day 3)  Hydrochlorothiazide 12.5 Mg  Tabs (Hydrochlorothiazide) .... Take 1 Tab  By Mouth Every Morning 4)  Simvastatin 40 Mg Tabs (Simvastatin) .... Take One Tablet At Bedtime 5)  Metoprolol Tartrate 25 Mg Tabs (Metoprolol Tartrate) .... One By Mouth Bid 6)  Metrogel 1 % Gel (Metronidazole) .... Apply A Small Amount To Affected Area At Bedtime. Disp 60 Gm 7)  Prilosec Otc 20 Mg Tbec (Omeprazole Magnesium) .... Take 1 Tablet By Mouth Twice A Day 8)  Sm Calcium/vitamin D 500-200 Mg-Unit Tabs (Calcium Carbonate-Vitamin D) .... 600 Mg By Mouth Two Times A Day 9)  Amlodipine Besylate 10 Mg  Tabs (Amlodipine Besylate) .... One By Mouth Daily 10)  Ferrous Sulfate 325 (65  Fe) Mg  Tbec (Ferrous Sulfate) .Marland Kitchen.. 1 By Mouth Two Times A Day  Allergies (verified): 1)  * Contrast Dye 2)  Sulfamethoxazole (Sulfamethoxazole)  Past History:  Past Medical History: Last updated: 07/11/2009 Esophageal Stricture/DILATED 2002 COLON POLYPS-HYPERPLASTIC 2007 DIVERTICULOSIS HTN HLD Hx DVT 2009/AND PE  Past Surgical History: Last updated: 07/06/2009 Colonoscopy D&C/Hysteroscopy 5/01 endometrial polyps - 09/25/1999, endometrial bx 10/00 benign  Lipid panel: TC-197, TG-225, HDL-59, LDL-93 - 04/01/2006 Lt knee replacement - 04/11/2005  Family History: Last updated: 04/10/2006 CAD, CVA, cancer  Social History: Last updated: 04/03/2007 nonsmoker; retired Marine scientist; ETOH insignificant Diet generally healthy Exercise walks one mile five times per week  Risk Factors: Alcohol Use: 1 (04/19/2009) Diet: healthy (04/19/2009) Exercise: yes (04/19/2009)  Risk Factors: Smoking Status: quit > 6 months (11/20/2009)  Social History: Smoking Status:  quit > 6 months  Review of Systems       The patient complains of weight loss.  The patient denies anorexia, hoarseness, chest pain, syncope, dyspnea on exertion, prolonged cough, abdominal pain, severe indigestion/heartburn, abnormal bleeding, and enlarged lymph nodes.    Physical Exam  General:  Well-developed,well-nourished,in no acute distress; alert,appropriate and cooperative throughout examination Lungs:  Normal respiratory effort, chest expands symmetrically. Lungs are clear to auscultation, no crackles or wheezes.  Heart:  Normal rate and regular rhythm. S1 and S2 normal without gallop, murmur, click, rub or other extra sounds. Cervical Nodes:  No lymphadenopathy noted Axillary Nodes:  No palpable lymphadenopathy Psych:  Cognition and judgment appear intact. Alert and cooperative with normal attention span and concentration. No apparent delusions, illusions, hallucinations   Impression & Recommendations:  Problem #  1:  ANEMIA-IRON DEFICIENCY (ICD-280.9) Will push for repeat endoscopy only if unable to maintain Hgb with oral iron Her updated medication list for this problem includes:    Ferrous Sulfate 325 (65 Fe) Mg Tbec (Ferrous sulfate) .Marland Kitchen... 1 by mouth two times a day  Orders: CBC-[facility] ([id number])  Problem # 2:  WEIGHT LOSS, ABNORMAL (ICD-783.21) Observe.  Recent TSH normal Orders: Comp Met-[facility] ([id number]) [place]- Est  Level 4 (44034)  Problem # 3:  HYPERTENSION (ICD-401.9)  Good control Her updated medication list for this problem includes:    Benazepril Hcl 40 Mg Tabs (Benazepril hcl) ..... One tab daily    Hydrochlorothiazide 12.5 Mg Tabs (Hydrochlorothiazide) .Marland Kitchen... Take 1 tab  by mouth every morning    Metoprolol Tartrate 25 Mg Tabs (Metoprolol tartrate) ..... One by mouth bid    Amlodipine Besylate 10 Mg Tabs (Amlodipine besylate) ..... One by mouth daily  BP today: 133/76 Prior BP: 138/60 (07/11/2009)  Labs Reviewed: K+: 3.7 (09/22/2009) Creat: : 1.00 (09/22/2009)   Chol: 160 (05/02/2009)   HDL: 63 (05/02/2009)   LDL: 69 (05/02/2009)   TG: 139 (05/02/2009)  Orders: [place]- Est  Level 4 (99214)  Complete Medication List: 1)  Benazepril Hcl 40 Mg Tabs (Benazepril hcl) .... One tab daily 2)  Glucosamine Sulfate 500 Mg Tabs (Glucosamine sulfate) .... Take 1 tablet by mouth twice a day 3)  Hydrochlorothiazide 12.5 Mg Tabs (Hydrochlorothiazide) .... Take 1 tab  by mouth every morning 4)  Simvastatin 40 Mg Tabs (Simvastatin) .... Take one tablet at bedtime 5)  Metoprolol Tartrate 25 Mg Tabs (Metoprolol tartrate) .... One by mouth bid 6)  Metrogel 1 % Gel (Metronidazole) .... Apply a small amount to affected area at bedtime. disp 60 gm 7)  Prilosec Otc 20 Mg Tbec (Omeprazole magnesium) .... Take 1 tablet by mouth twice a day 8)  Sm Calcium/vitamin D 500-200 Mg-unit Tabs (Calcium carbonate-vitamin d) .... 600 mg by mouth two times a day 9)  Amlodipine Besylate 10 Mg Tabs (Amlodipine besylate)  .... One by mouth daily 10)  Ferrous Sulfate 325 (65 Fe) Mg Tbec (Ferrous sulfate) .Marland Kitchen.. 1 by mouth two times a day  Other Orders: Influenza Vaccine MCR ([id number])  Patient Instructions: 1)  I will call with blood work results   Influenza Vaccine    Vaccine Type: Fluvax MCR    Site: left deltoid    Mfr: GlaxoSmithKline    Dose: 0.5 ml    Route: IM    Given by: Mauricia Area CMA,    Exp. Date: 08/08/2010    Lot #: [id number]    VIS given: 09/05/09 version given November 20, 2009.  Flu Vaccine Consent Questions    Do you have a history of severe allergic reactions to this vaccine? no    Any prior history of allergic reactions to egg and/or gelatin? no    Do you have a sensitivity to the preservative Thimersol? no    Do you have a past history of Guillan-Barre Syndrome? no    Do you currently have an acute febrile illness? no    Have you ever had a severe reaction to latex?  no    Vaccine information given and explained to patient? yes    Are you currently pregnant? no    Prevention & Chronic Care Immunizations   Influenza vaccine: Fluvax MCR  (11/20/2009)   Influenza vaccine due: 10/12/2009    Tetanus booster: 04/13/2008: given   Tetanus booster due: 04/14/2018    Pneumococcal vaccine: Done.  (02/12/2000)   Pneumococcal vaccine due: None    H. zoster vaccine: 05/08/2006: Zostavax  Colorectal Screening   Hemoccult: Done.  (03/14/2004)   Hemoccult due: Not Indicated    Colonoscopy: Done.  (01/11/2006)   Colonoscopy due: 01/12/2011  Other Screening   Pap smear: Done.  (02/12/2003)   Pap smear due: Not Indicated    Mammogram: ASSESSMENT: Negative - BI-RADS 1^MM DIGITAL SCREENING  (04/13/2009)   Mammogram due: 04/14/2010    DXA bone density scan: Lumbar Spine:  T Score > -1.0 Spine.  Hip Total: T Score -2.5 to -1.0 Hip.      (11/30/2008)   DXA scan due: 12/01/2011    Smoking status: quit > 6 months  (11/20/2009)  Lipids   Total Cholesterol: 160  (05/02/2009)    Lipid panel action/deferral: Lipid Panel ordered   LDL: 69  (05/02/2009)   LDL Direct: 85  (08/18/2008)   HDL: 63  (05/02/2009)   Triglycerides: 139  (05/02/2009)    SGOT (AST): 16  (09/22/2009)   BMP action: Ordered   SGPT (ALT): 14  (09/22/2009) CMP ordered    Alkaline phosphatase: 45  (09/22/2009)   Total bilirubin: 0.3  (09/22/2009)    Lipid flowsheet reviewed?: Yes   Progress toward LDL goal: At goal  Hypertension   Last Blood Pressure: 133 / 76  (11/20/2009)   Serum creatinine: 1.00  (09/22/2009)   BMP action: Ordered   Serum potassium 3.7  (09/22/2009) CMP ordered     Hypertension flowsheet reviewed?: Yes   Progress toward BP goal: At goal  Self-Management Support :   Personal Goals (by the next clinic visit) :      Personal blood pressure goal: 140/90  (04/19/2009)     Personal LDL goal: 100  (04/19/2009)    Hypertension self-management support: Written self-care plan  (04/19/2009)    Lipid self-management support: Written self-care plan  (04/19/2009)

## 2010-03-13 NOTE — Progress Notes (Signed)
Summary: CT questions  Phone Note Call from Patient Call back at Home Phone [phone]   Caller: Patient Reason for Call: Talk to Nurse Summary of Call: pt has additonal questions about CT scan done on 6/30 Initial call taken by: Abelino Derrick CMA Deborra Medina),  August 15, 2009 8:32 AM  Follow-up for Phone Call        questions answered about CT scan.  She is scheduled for a pelvic US for tomorrow then she will see a GYN. Follow-up by: Barb Merino RN, [place],  August 15, 2009 8:51 AM

## 2010-03-13 NOTE — Assessment & Plan Note (Signed)
Summary: SOB,df   Vital Signs:  Patient profile:   77 year old female Height:      62.5 inches Weight:      186.6 pounds BMI:     33.71 O2 Sat:      98 % on Room air Temp:     98.1 degrees F oral Pulse rate:   87 / minute BP sitting:   134 / 74  (left arm) Cuff size:   regular  Vitals Entered By: Isabelle Course (Jul 05, 2009 2:58 PM)  O2 Flow:  Room air CC: C/O SOB Is Patient Diabetic? No Pain Assessment Patient in pain? no        CC:  C/O SOB.  History of Present Illness: C/O SOBOE.  No chest pain.  Did not exercise much over winter, but change is very noticable - gets sob walking on flat surfaces.  More so on incline or stairs.  Smoked with 13 pack year history quit 1963.  Also had bilateral PE 6/02.    Low back and left hip pain - has been chalking up to arthritis - it has also increased.  Pain is worse with walking.    Restless, tense, not sleeping as well.    Habits & Providers  Alcohol-Tobacco-Diet     Tobacco Status: quit     Year Quit: 1963  Current Medications (verified): 1)  Aspirin Childrens 81 Mg Chew (Aspirin) .... Take 1 Tablet By Mouth Once A Day 2)  Benazepril Hcl 40 Mg  Tabs (Benazepril Hcl) .... One Tab Daily 3)  Fosamax 70 Mg Tabs (Alendronate Sodium) .... Take 1 Tablet By Mouth Once A Week 4)  Glucosamine Sulfate 500 Mg Tabs (Glucosamine Sulfate) .... Take 1 Tablet By Mouth Twice A Day 5)  Hydrochlorothiazide 12.5 Mg  Tabs (Hydrochlorothiazide) .... Take 1 Tab  By Mouth Every Morning 6)  Simvastatin 40 Mg Tabs (Simvastatin) .... Take One Tablet At Bedtime 7)  Metoprolol Tartrate 50 Mg Tabs (Metoprolol Tartrate) .... Take 1 Tablet By Mouth Twice A Day 8)  Metrogel 1 % Gel (Metronidazole) .... Apply A Small Amount To Affected Area At Bedtime. Disp 60 Gm 9)  Prilosec Otc 20 Mg Tbec (Omeprazole Magnesium) .... Take 1 Tablet By Mouth Once A Day 10)  Sm Calcium/vitamin D 500-200 Mg-Unit Tabs (Calcium Carbonate-Vitamin D) .... Take 2 Tablet By Mouth Once  A Day 11)  Amlodipine Besylate 10 Mg  Tabs (Amlodipine Besylate) .... One By Mouth Daily  Allergies (verified): 1)  * Contrast Dye 2)  Sulfamethoxazole (Sulfamethoxazole)  Past History:  Past medical, surgical, family and social histories (including risk factors) reviewed, and no changes noted (except as noted below).  Past Medical History: Reviewed history from 04/10/2006 and no changes required. eophageal stricture  Past Surgical History: Reviewed history from 04/10/2006 and no changes required. Colonoscopy -, D&C/hysteroscopy 5/01 endometrial polyps - 09/25/1999, endometrial bx 10/00 benign -, Lipid panel: TC-197, TG-225, HDL-59, LDL-93 - 04/01/2006, Lt knee replacement - 04/11/2005  Family History: Reviewed history from 04/10/2006 and no changes required. CAD, CVA, cancer  Social History: Reviewed history from 04/03/2007 and no changes required. nonsmoker; retired Marine scientist; ETOH insignificant Diet generally healthy Exercise walks one mile five times per weekSmoking Status:  quit  Review of Systems  The patient denies melena, severe indigestion/heartburn, and hematuria.    Physical Exam  General:  Well-developed,well-nourished,in no acute distress; alert,appropriate and cooperative throughout examination Eyes:  Pale conjunctive Neck:  No deformities, masses, or tenderness noted. Lungs:  Normal respiratory  effort, chest expands symmetrically. Lungs are clear to auscultation, no crackles or wheezes. Heart:  Normal rate and regular rhythm. S1 and S2 normal without gallop, murmur, click, rub or other extra sounds. Abdomen:  Bowel sounds positive,abdomen soft and non-tender without masses, organomegaly or hernias noted. Extremities:  diminished pulses Left foot but has had 3 ankle and 2 knee surgeries on that side.      Impression & Recommendations:  Problem # 1:  DYSPNEA (ICD-786.09) Leading dx is anemia.  Many other causes include thyroid dysfunction, cardiac pulmonary.  Will  refer to Dr. Valentina Lucks for PFTs Her updated medication list for this problem includes:    Benazepril Hcl 40 Mg Tabs (Benazepril hcl) ..... One tab daily    Hydrochlorothiazide 12.5 Mg Tabs (Hydrochlorothiazide) .Marland Kitchen... Take 1 tab  by mouth every morning    Metoprolol Tartrate 50 Mg Tabs (Metoprolol tartrate) .Marland Kitchen... Take 1 tablet by mouth twice a day  Orders: Comp Met-[facility] ([id number]) CBC-[facility] ([id number]) TSH-[facility] ([id number]) [facility]- Est  Level 4 (10626)  Problem # 2:  OSTEOARTHRITIS, LOWER LEG (ICD-715.96) Will check Xrays.  Pain could also be claudication Her updated medication list for this problem includes:    Aspirin Childrens 81 Mg Chew (Aspirin) .Marland Kitchen... Take 1 tablet by mouth once a day  Orders: Radiology other (Radiology Other) [facility]- Est  Level 4 (94854)  Problem # 3:  HYPERTENSION, BENIGN SYSTEMIC (ICD-401.1)  well controled Her updated medication list for this problem includes:    Benazepril Hcl 40 Mg Tabs (Benazepril hcl) ..... One tab daily    Hydrochlorothiazide 12.5 Mg Tabs (Hydrochlorothiazide) .Marland Kitchen... Take 1 tab  by mouth every morning    Metoprolol Tartrate 50 Mg Tabs (Metoprolol tartrate) .Marland Kitchen... Take 1 tablet by mouth twice a day    Amlodipine Besylate 10 Mg Tabs (Amlodipine besylate) ..... One by mouth daily  Orders: Magnesium-[facility] ([id number]) [facility]- Est  Level 4 (99214)  BP today: 134/74 Prior BP: 130/59 (04/19/2009)  Labs Reviewed: K+: 4.2 (05/02/2009) Creat: : 1.22 (05/02/2009)   Chol: 160 (05/02/2009)   HDL: 63 (05/02/2009)   LDL: 69 (05/02/2009)   TG: 139 (05/02/2009)  Complete Medication List: 1)  Aspirin Childrens 81 Mg Chew (Aspirin) .... Take 1 tablet by mouth once a day 2)  Benazepril Hcl 40 Mg Tabs (Benazepril hcl) .... One tab daily 3)  Fosamax 70 Mg Tabs (Alendronate sodium) .... Take 1 tablet by mouth once a week 4)  Glucosamine Sulfate 500 Mg Tabs (Glucosamine sulfate) .... Take 1 tablet by mouth twice a day 5)  Hydrochlorothiazide 12.5 Mg Tabs  (Hydrochlorothiazide) .... Take 1 tab  by mouth every morning 6)  Simvastatin 40 Mg Tabs (Simvastatin) .... Take one tablet at bedtime 7)  Metoprolol Tartrate 50 Mg Tabs (Metoprolol tartrate) .... Take 1 tablet by mouth twice a day 8)  Metrogel 1 % Gel (Metronidazole) .... Apply a small amount to affected area at bedtime. disp 60 gm 9)  Prilosec Otc 20 Mg Tbec (Omeprazole magnesium) .... Take 1 tablet by mouth once a day 10)  Sm Calcium/vitamin D 500-200 Mg-unit Tabs (Calcium carbonate-vitamin d) .... Take 2 tablet by mouth once a day 11)  Amlodipine Besylate 10 Mg Tabs (Amlodipine besylate) .... One by mouth daily    Prevention & Chronic Care Immunizations   Influenza vaccine: Fluvax MCR  (11/16/2008)   Influenza vaccine due: 10/12/2009    Tetanus booster: 04/13/2008: given   Tetanus booster due: 04/14/2018    Pneumococcal vaccine: Done.  (02/12/2000)   Pneumococcal vaccine  due: None    H. zoster vaccine: 05/08/2006: Zostavax  Colorectal Screening   Hemoccult: Done.  (03/14/2004)   Hemoccult due: Not Indicated    Colonoscopy: Done.  (01/11/2006)   Colonoscopy due: 01/12/2011  Other Screening   Pap smear: Done.  (02/12/2003)   Pap smear due: Not Indicated    Mammogram: ASSESSMENT: Negative - BI-RADS 1^MM DIGITAL SCREENING  (04/13/2009)   Mammogram due: 04/14/2010    DXA bone density scan: Lumbar Spine:  T Score > -1.0 Spine.  Hip Total: T Score -2.5 to -1.0 Hip.      (11/30/2008)   DXA scan due: 12/01/2011    Smoking status: quit  (07/05/2009)  Lipids   Total Cholesterol: 160  (05/02/2009)   Lipid panel action/deferral: Lipid Panel ordered   LDL: 69  (05/02/2009)   LDL Direct: 85  (08/18/2008)   HDL: 63  (05/02/2009)   Triglycerides: 139  (05/02/2009)    SGOT (AST): 18  (05/02/2009)   BMP action: Ordered   SGPT (ALT): 13  (05/02/2009) CMP ordered    Alkaline phosphatase: 44  (05/02/2009)   Total bilirubin: 0.3  (05/02/2009)    Lipid flowsheet reviewed?:  Yes   Progress toward LDL goal: At goal  Hypertension   Last Blood Pressure: 134 / 74  (07/05/2009)   Serum creatinine: 1.22  (05/02/2009)   BMP action: Ordered   Serum potassium 4.2  (05/02/2009) CMP ordered     Hypertension flowsheet reviewed?: Yes   Progress toward BP goal: At goal  Self-Management Support :   Personal Goals (by the next clinic visit) :      Personal blood pressure goal: 140/90  (04/19/2009)     Personal LDL goal: 100  (04/19/2009)    Hypertension self-management support: Written self-care plan  (04/19/2009)    Lipid self-management support: Written self-care plan  (04/19/2009)

## 2010-03-13 NOTE — Assessment & Plan Note (Signed)
Summary: f/u,df   Vital Signs:  Patient profile:   77 year old female Height:      62.5 inches Weight:      187.6 pounds BMI:     33.89 Temp:     98.3 degrees F oral Pulse rate:   78 / minute Pulse rhythm:   regular BP sitting:   130 / 59  Vitals Entered By: Audelia Hives CMA (April 19, 2009 1:54 PM)  CC:  Annual wellness exam.  History of Present Illness: Feeling good.  Not walking as much due to pain. Congratulated on wt loss.  She is eating healthy. Brought in med refill faxes to complete  Habits & Providers  Alcohol-Tobacco-Diet     Alcohol drinks/day: 1     Tobacco Status: quit > 6 months     Diet Comments: healthy     Diet Counseling: not indicated; diet is assessed to be healthy  Exercise-Depression-Behavior     Does Patient Exercise: yes     Exercise Counseling: not indicated; exercise is adequate     Type of exercise: walking     Exercise (avg: min/session): 30-60     Times/week: 3     Have you felt down or hopeless? no     Have you felt little pleasure in things? no     Depression Counseling: not indicated; screening negative for depression     STD Risk: never     Drug Use: never     Seat Belt Use: always     Sun Exposure: rarely  Current Medications (verified): 1)  Aspirin Childrens 81 Mg Chew (Aspirin) .... Take 1 Tablet By Mouth Once A Day 2)  Benazepril Hcl 40 Mg  Tabs (Benazepril Hcl) .... One Tab Daily 3)  Fosamax 70 Mg Tabs (Alendronate Sodium) .... Take 1 Tablet By Mouth Once A Week 4)  Glucosamine Sulfate 500 Mg Tabs (Glucosamine Sulfate) .... Take 1 Tablet By Mouth Twice A Day 5)  Hydrochlorothiazide 12.5 Mg  Tabs (Hydrochlorothiazide) .... Take 1 Tab  By Mouth Every Morning 6)  Simvastatin 40 Mg Tabs (Simvastatin) .... Take One Tablet At Bedtime 7)  Metoprolol Tartrate 50 Mg Tabs (Metoprolol Tartrate) .... Take 1 Tablet By Mouth Twice A Day 8)  Metrogel 1 % Gel (Metronidazole) .... Apply A Small Amount To Affected Area At Bedtime. Disp 60  Gm 9)  Prilosec Otc 20 Mg Tbec (Omeprazole Magnesium) .... Take 1 Tablet By Mouth Once A Day 10)  Sm Calcium/vitamin D 500-200 Mg-Unit Tabs (Calcium Carbonate-Vitamin D) .... Take 2 Tablet By Mouth Once A Day 11)  Amlodipine Besylate 10 Mg  Tabs (Amlodipine Besylate) .... One By Mouth Daily  Allergies (verified): 1)  * Contrast Dye 2)  Sulfamethoxazole (Sulfamethoxazole)  Past History:  Past medical, surgical, family and social histories (including risk factors) reviewed, and no changes noted (except as noted below).  Past Medical History: Reviewed history from 04/10/2006 and no changes required. eophageal stricture  Past Surgical History: Reviewed history from 04/10/2006 and no changes required. Colonoscopy -, D&C/hysteroscopy 5/01 endometrial polyps - 09/25/1999, endometrial bx 10/00 benign -, Lipid panel: TC-197, TG-225, HDL-59, LDL-93 - 04/01/2006, Lt knee replacement - 04/11/2005  Family History: Reviewed history from 04/10/2006 and no changes required. CAD, CVA, cancer  Social History: Reviewed history from 04/03/2007 and no changes required. nonsmoker; retired Marine scientist; ETOH insignificant Diet generally healthy Exercise walks one mile five times per weekSmoking Status:  quit > 6 months Does Patient Exercise:  yes STD Risk:  never Drug Use:  never Seat Belt Use:  always Sun Exposure-Excessive:  rarely  Review of Systems       The patient complains of vision loss, decreased hearing, and hoarseness.  The patient denies anorexia, fever, chest pain, dyspnea on exertion, peripheral edema, incontinence, muscle weakness, depression, unusual weight change, and abnormal bleeding.         vision loss and hearing decrease bilateral and slow attributed to aging.  Hoarseness plans FU with ENT.  Some difficulty swallowing, may need her stricture dilated again  Physical Exam  General:  Well-developed,well-nourished,in no acute distress; alert,appropriate and cooperative throughout  examination Head:  Normocephalic and atraumatic without obvious abnormalities. No apparent alopecia or balding. Eyes:  No corneal or conjunctival inflammation noted. EOMI. Perrla. Funduscopic exam benign, without hemorrhages, exudates or papilledema. Vision grossly normal. Mouth:  Oral mucosa and oropharynx without lesions or exudates.  Teeth in good repair. Neck:  No deformities, masses, or tenderness noted. Lungs:  Normal respiratory effort, chest expands symmetrically. Lungs are clear to auscultation, no crackles or wheezes. Heart:  Normal rate and regular rhythm. S1 and S2 normal without gallop, murmur, click, rub or other extra sounds. Abdomen:  Bowel sounds positive,abdomen soft and non-tender without masses, organomegaly or hernias noted. Extremities:  No clubbing, cyanosis, edema, or deformity noted with normal full range of motion of all joints.   Neurologic:  No cranial nerve deficits noted. Station and gait are normal. Plantar reflexes are down-going bilaterally. DTRs are symmetrical throughout. Sensory, motor and coordinative functions appear intact.   Impression & Recommendations:  Problem # 1:  Preventive Health Care (ICD-V70.0)  Orders: [facility] - Est  65+ [id number])  Problem # 2:  HYPERCHOLESTEROLEMIA (ICD-272.0) recheck labs Her updated medication list for this problem includes:    Simvastatin 40 Mg Tabs (Simvastatin) .Marland Kitchen... Take one tablet at bedtime  Future Orders: T-Lipid Profile ([id number]) ... 04/12/2010  Problem # 3:  HYPERTENSION, BENIGN SYSTEMIC (ICD-401.1) Well controled on current regimine and with weight loss Her updated medication list for this problem includes:    Benazepril Hcl 40 Mg Tabs (Benazepril hcl) ..... One tab daily    Hydrochlorothiazide 12.5 Mg Tabs (Hydrochlorothiazide) .Marland Kitchen... Take 1 tab  by mouth every morning    Metoprolol Tartrate 50 Mg Tabs (Metoprolol tartrate) .Marland Kitchen... Take 1 tablet by mouth twice a day    Amlodipine Besylate 10 Mg Tabs  (Amlodipine besylate) ..... One by mouth daily  Complete Medication List: 1)  Aspirin Childrens 81 Mg Chew (Aspirin) .... Take 1 tablet by mouth once a day 2)  Benazepril Hcl 40 Mg Tabs (Benazepril hcl) .... One tab daily 3)  Fosamax 70 Mg Tabs (Alendronate sodium) .... Take 1 tablet by mouth once a week 4)  Glucosamine Sulfate 500 Mg Tabs (Glucosamine sulfate) .... Take 1 tablet by mouth twice a day 5)  Hydrochlorothiazide 12.5 Mg Tabs (Hydrochlorothiazide) .... Take 1 tab  by mouth every morning 6)  Simvastatin 40 Mg Tabs (Simvastatin) .... Take one tablet at bedtime 7)  Metoprolol Tartrate 50 Mg Tabs (Metoprolol tartrate) .... Take 1 tablet by mouth twice a day 8)  Metrogel 1 % Gel (Metronidazole) .... Apply a small amount to affected area at bedtime. disp 60 gm 9)  Prilosec Otc 20 Mg Tbec (Omeprazole magnesium) .... Take 1 tablet by mouth once a day 10)  Sm Calcium/vitamin D 500-200 Mg-unit Tabs (Calcium carbonate-vitamin d) .... Take 2 tablet by mouth once a day 11)  Amlodipine Besylate 10 Mg Tabs (Amlodipine besylate) .Marland KitchenMarland KitchenMarland Kitchen  One by mouth daily  Other Orders: Future Orders: T-Comprehensive Metabolic Panel ([id number]) ... 04/12/2010  Patient Instructions: 1)  come back for fasting labs.  I'll call with results. 2)  Keep up great work on weight loss 3)  Please schedule a follow-up appointment in 6 months .    Prevention & Chronic Care Immunizations   Influenza vaccine: Fluvax MCR  (11/16/2008)   Influenza vaccine due: 10/12/2009    Tetanus booster: 04/13/2008: given   Tetanus booster due: 04/14/2018    Pneumococcal vaccine: Done.  (02/12/2000)   Pneumococcal vaccine due: None    H. zoster vaccine: 05/08/2006: Zostavax  Colorectal Screening   Hemoccult: Done.  (03/14/2004)   Hemoccult due: Not Indicated    Colonoscopy: Done.  (01/11/2006)   Colonoscopy due: 01/12/2011  Other Screening   Pap smear: Done.  (02/12/2003)   Pap smear due: Not Indicated    Mammogram:  ASSESSMENT: Negative - BI-RADS 1^MM DIGITAL SCREENING  (04/13/2009)   Mammogram due: 04/14/2010    DXA bone density scan: Lumbar Spine:  T Score > -1.0 Spine.  Hip Total: T Score -2.5 to -1.0 Hip.      (11/30/2008)   DXA scan due: 12/01/2011    Smoking status: quit > 6 months  (04/19/2009)  Lipids   Total Cholesterol: 208  (04/11/2008)   Lipid panel action/deferral: Lipid Panel ordered   LDL: 106  (04/11/2008)   LDL Direct: 85  (08/18/2008)   HDL: 64  (04/11/2008)   Triglycerides: 190  (04/11/2008)    SGOT (AST): 21  (08/18/2008)   BMP action: Ordered   SGPT (ALT): 19  (08/18/2008) CMP ordered    Alkaline phosphatase: 44  (08/18/2008)   Total bilirubin: 0.4  (08/18/2008)    Lipid flowsheet reviewed?: Yes   Progress toward LDL goal: At goal  Hypertension   Last Blood Pressure: 130 / 59  (04/19/2009)   Serum creatinine: 1.03  (08/18/2008)   BMP action: Ordered   Serum potassium 4.0  (08/18/2008) CMP ordered     Hypertension flowsheet reviewed?: Yes   Progress toward BP goal: At goal  Self-Management Support :   Personal Goals (by the next clinic visit) :      Personal blood pressure goal: 140/90  (04/19/2009)     Personal LDL goal: 100  (04/19/2009)    Hypertension self-management support: Written self-care plan  (04/19/2009)   Hypertension self-care plan printed.    Lipid self-management support: Written self-care plan  (04/19/2009)   Lipid self-care plan printed.    Bone Density  Procedure date:  11/30/2008  Findings:      Lumbar Spine:  T Score > -1.0 Spine.  Hip Total: T Score -2.5 to -1.0 Hip.       Bone Density  Procedure date:  11/30/2008  Findings:      Lumbar Spine:  T Score > -1.0 Spine.  Hip Total: T Score -2.5 to -1.0 Hip.

## 2010-03-13 NOTE — Progress Notes (Signed)
Summary: Appt sooner than next available   Phone Note From Other Clinic   Caller: [facility] [phone] Call For: Dr Olevia Perches Reason for Call: Schedule Patient Appt Summary of Call: Would like pt seen sooner than next available on Aug 8th for anemia & reflux esophagitis. Jeani Hawking would like call bak after 1:30pm please. Initial call taken by: Irwin Brakeman [facility],  Jul 07, 2009 12:32 PM  Follow-up for Phone Call        Pt. will see Dr.Brodie on 07-17-09 at [place] will advise pt. of appt. and will fax notes.  Follow-up by: Vivia Ewing LPN,  Jul 08, 5535 4:82 PM     Appended Document: Appt sooner than next available Jeani Hawking called back, Dr.Hensel wants pt. seen sooner than above appt., pt. will see Nicoletta Ba PAC on 07-11-09 at 2pm, Jeani Hawking will advise pt.

## 2010-03-13 NOTE — Letter (Signed)
Summary: EGD Instructions  [facility]  [place], [name] [address]   Phone: [phone]  Fax: [phone]       Sara Russell    08/03/1930    MRN: [id number]       Procedure Day /Date:07-21-09     Arrival Time: 3:00 PM      Procedure Time: 4:00 PM     Location of Procedure:                    X     [place] (4th Floor)  PREPARATION FOR ENDOSCOPY   On 07-21-09 THE DAY OF THE PROCEDURE:  1.   No solid foods, milk or milk products are allowed after midnight the night before your procedure.  2.   Do not drink anything colored red or purple.  Avoid juices with pulp.  No orange juice.  3.  You may drink clear liquids until 2:00 PM , which is 2 hours before your procedure.                                                                                                CLEAR LIQUIDS INCLUDE: Water Jello Ice Popsicles Tea (sugar ok, no milk/cream) Powdered fruit flavored drinks Coffee (sugar ok, no milk/cream) Gatorade Juice: apple, white grape, white cranberry  Lemonade Clear bullion, consomm, broth Carbonated beverages (any kind) Strained chicken noodle soup Hard Candy   MEDICATION INSTRUCTIONS  Unless otherwise instructed, you should take regular prescription medications with a small sip of water as early as possible the morning of your procedure.          OTHER INSTRUCTIONS  You will need a responsible adult at least 75 years of age to accompany you and drive you home.   This person must remain in the waiting room during your procedure.  Wear loose fitting clothing that is easily removed.  Leave jewelry and other valuables at home.  However, you may wish to bring a book to read or an iPod/MP3 player to listen to music as you wait for your procedure to start.  Remove all body piercing jewelry and leave at home.  Total time from sign-in until discharge is approximately 2-3 hours.  You should go home directly after  your procedure and rest.  You can resume normal activities the day after your procedure.  The day of your procedure you should not:   Drive   Make legal decisions   Operate machinery   Drink alcohol   Return to work  You will receive specific instructions about eating, activities and medications before you leave.    The above instructions have been reviewed and explained to me by   _______________________    I fully understand and can verbalize these instructions _____________________________ Date _________

## 2010-03-13 NOTE — Progress Notes (Signed)
Summary: phn msg   Phone Note Call from Patient Call back at Home Phone [phone]   Caller: Patient Summary of Call: pt is requesting to talk to Dr Erin Hearing about the capsule endo - movie of her intestines. Initial call taken by: Audie Clear,  August 07, 2009 9:40 AM  Follow-up for Phone Call        Called and discussed. Follow-up by: Madison Hickman MD,  August 08, 2009 10:43 AM

## 2010-03-13 NOTE — Progress Notes (Signed)
Summary: Rx issue  Pt doesn't  need rx now. First one was found [facility]  March 08, 2008 3:17 PM  Phone Note Call from Patient Call back at [facility] Phone [phone]   Summary of Call: pt states we sent rx's to mail pharmacy and they never received them, would like to discuss. Initial call taken by: Drucie Ip,  March 07, 2008 2:19 PM  Follow-up for Phone Call        pt called again. Follow-up by: Audie Clear,  March 07, 2008 4:20 PM  Additional Follow-up for Phone Call Additional follow up Details #1::        lmom to call back Additional Follow-up by: Carolyne Littles,  March 08, 2008 11:02 AM

## 2010-03-13 NOTE — Progress Notes (Signed)
Summary: BP  Phone Note Call from Patient Call back at [facility] Phone [phone]   Summary of Call: Pt states her BP is still not good and would like to know what Dr. Andria Frames thinks she should do.  States she has been discussing this with him. Initial call taken by: Drucie Ip,  May 06, 2007 8:50 AM  Follow-up for Phone Call        160/86 range with med changes. message to md to see if further changes are needed Follow-up by: Elige Radon RN,  May 06, 2007 8:55 AM  Additional Follow-up for Phone Call Additional follow up Details #1::        Fax of pharm is [phone] ID # [id number]  Called and discussed.  Will add amlodipine Additional Follow-up by: Madison Hickman MD,  May 06, 2007 11:27 AM    New/Updated Medications: BENAZEPRIL HCL 40 MG  TABS (BENAZEPRIL HCL) one tab daily AMLODIPINE BESYLATE 10 MG  TABS (AMLODIPINE BESYLATE) one by mouth daily   Prescriptions: AMLODIPINE BESYLATE 10 MG  TABS (AMLODIPINE BESYLATE) one by mouth daily  #90 x 3   Entered and Authorized by:   Madison Hickman MD   Signed by:   Madison Hickman MD on 05/06/2007   Method used:   Printed then faxed to ...         RxID:   [id number] BENAZEPRIL HCL 40 MG  TABS (BENAZEPRIL HCL) one tab daily  #90 x 3   Entered and Authorized by:   Madison Hickman MD   Signed by:   Madison Hickman MD on 05/06/2007   Method used:   Printed then faxed to ...         RxID:   [id number]

## 2010-03-13 NOTE — Miscellaneous (Signed)
Summary: question from patient for Dr. Andria Frames   Clinical Lists Changes  spoke with patient and advised that we have scheduled appointment tomorrow at [place] office. advised that appointment does not have to be rushed now and she can keep appointment at  [facility] scheduled for 07/21, but she states she will go to appointment tomorrow.  will cancel appointment at [facility] she does have a question for Dr. Andria Frames about  her GI bleed and what are the recommendations  regarding that.  will forward to Dr. Andria Frames. Marcell Barlow RN  August 16, 2009 4:38 PM Called and discussed. She will see gynecologist tomorrow.  She will also see Dr. Olevia Perches afterward to follow up small bowel polyp. Madison Hickman MD  August 16, 2009 4:53 PM

## 2010-03-13 NOTE — Progress Notes (Signed)
Summary: Rx Prob   Phone Note Refill Request Call back at Home Phone [phone] Message from:  Patient  Refills Requested: Medication #1:  PRILOSEC OTC 20 MG TBEC Take 1 tablet by mouth twice a day  Medication #2:  METOPROLOL TARTRATE 25 MG TABS one by mouth bid PT SAYS SHE NEEDS RX FOR 180 DUE TO USING EXPRESS SCRIPT ALSO HER METOPROLOL SHOULD BE 180.  [place] # [phone].  Initial call taken by: Raymond Gurney,  Jul 07, 2009 11:24 AM  Follow-up for Phone Call        to pcp Follow-up by: Marcell Barlow RN,  Jul 07, 2009 11:29 AM  Additional Follow-up for Phone Call Additional follow up Details #1::        Done.  Please notify patient. Additional Follow-up by: Madison Hickman MD,  Jul 07, 2009 11:48 AM    Prescriptions: PRILOSEC OTC 20 MG TBEC (OMEPRAZOLE MAGNESIUM) Take 1 tablet by mouth twice a day  #180 x 3   Entered and Authorized by:   Madison Hickman MD   Signed by:   Madison Hickman MD on 07/07/2009   Method used:   Printed then faxed to ...         RxID:   [id number] METOPROLOL TARTRATE 25 MG TABS (METOPROLOL TARTRATE) one by mouth bid  #180 x 3   Entered and Authorized by:   Madison Hickman MD   Signed by:   Madison Hickman MD on 07/07/2009   Method used:   Printed then faxed to ...         RxID:   [id number]  message left on voicemail that RX have  been done. Marcell Barlow RN  Jul 07, 2009 1:37 PM

## 2010-03-13 NOTE — Progress Notes (Signed)
Summary: Capsule not passed   Phone Note Call from Patient Call back at Home Phone [phone]   Caller: Patient Summary of Call: patient states  that she had her capsule endo one week ago and has not seen the capsule pass yet. would it be ok to order a KUB? Initial call taken by: Bernita Buffy CMA Deborra Medina),  August 02, 2009 9:13 AM  Follow-up for Phone Call        Pt. had a Capsule Endo. done 07-27-09, she has been examining her stools daily, hasn't seen the capsule.  She has been having LLQ discomfort. She will go to [facility] radiology today for a KUB. Pt. instructed to call back as needed.  Follow-up by: Vivia Ewing LPN,  August 03, 7895 84:78 AM  Additional Follow-up for Phone Call Additional follow up Details #1::        KUB shows the capsule has passed Additional Follow-up by: Lafayette Dragon MD,  August 02, 2009 11:23 PM     Appended Document: Capsule not passed Pt. advised. Pt. instructed to call back as needed.

## 2010-03-13 NOTE — Miscellaneous (Signed)
Summary: Orders Update  Clinical Lists Changes  Orders: Added new Test order of TLB-CBC Platelet - w/Differential ([id number]) - Signed

## 2010-03-13 NOTE — Assessment & Plan Note (Signed)
Summary: ANEMIA             [facility]  Patient here today for capsule endoscopy for Dr. Olevia Perches.  Pt verbalized understanding of all verbal and written instructions.  Pt tolerated well.  Lot #   [id number]  25  exp 2012/07.

## 2010-03-13 NOTE — Miscellaneous (Signed)
Summary: med refill   Clinical Lists Changes Refilled via fax request. Madison Hickman MD  May 15, 2009 11:49 AM  Medications: Rx of BENAZEPRIL HCL 40 MG  TABS (BENAZEPRIL HCL) one tab daily;  #90 x 3;  Signed;  Entered by: Madison Hickman MD;  Authorized by: Madison Hickman MD;  Method used: Handwritten    Prescriptions: BENAZEPRIL HCL 40 MG  TABS (BENAZEPRIL HCL) one tab daily  #90 x 3   Entered and Authorized by:   Madison Hickman MD   Signed by:   Madison Hickman MD on 05/15/2009   Method used:   Handwritten   RxID:   [id number]

## 2010-03-13 NOTE — Miscellaneous (Signed)
Summary: re; GI appointment   Clinical Lists Changes   advised Dr. Andria Frames of appointment time on 07/17/2009 and advised that RN was told that patient could be seen by PA on Tuesday 07/11/2009 if needed . Dr. Andria Frames would prefer patient be seen on Tuesday . called  GI  and   appointment  scheduled for 06/13/2009 at 2:00 PM  with PA. Amy Easterwood. patient notified.  Marcell Barlow RN  Jul 07, 2009 2:47 PM

## 2010-03-13 NOTE — Progress Notes (Signed)
Summary: Status of pt   Phone Note Call from Patient Call back at Home Phone [phone]   Reason for Call: Talk to Doctor Summary of Call: pt wants MD to know she had an endoscopy done & is going thu to have a capsule endoscopy done Initial call taken by: Samara Snide,  July 24, 2009 9:53 AM  Follow-up for Phone Call        Called and discussed.  Await capsule endoscopy Follow-up by: Madison Hickman MD,  July 24, 2009 12:09 PM

## 2010-03-13 NOTE — Progress Notes (Signed)
Summary: results   Phone Note Call from Patient Call back at Home Phone [phone]   Caller: Patient Summary of Call: pt is wanting results of CT scan Initial call taken by: Audie Clear,  August 11, 2009 9:18 AM  Follow-up for Phone Call        states she has the results but wants to talk with Dr. Andria Frames. message sent to pcp Follow-up by: Elige Radon RN,  August 11, 2009 9:49 AM  Additional Follow-up for Phone Call Additional follow up Details #1::        Called and discussed.  Now has two issues, small bowel polyp not seen on CT and new concern for Lt pelvic mass.  Needs both pelvic (transvaginal) to better evaluate mass.  Also needs prompt Gyn referral for plans to obtain tissue diagnosis.  Both test and referral ordered. Additional Follow-up by: Madison Hickman MD,  August 11, 2009 11:10 AM  New Problems: PELVIC MASS (ICD-789.30)   New Problems: PELVIC MASS (ICD-789.30) Referal  faxed to [facility]. Ultrasound has been scheduled and patient is aware. Marcell Barlow RN  August 11, 2009 3:28 PM

## 2010-03-13 NOTE — Progress Notes (Signed)
Summary: Request to speak with MD  Phone Note Call from Patient Call back at Home Phone [phone]   Reason for Call: Talk to Doctor Summary of Call: Requesting to speak with MD, sts her BP is still not doing very well and would like to know what Dr. Andria Frames suggests? Initial call taken by: ERIN LEVAN,  April 27, 2007 8:44 AM  Follow-up for Phone Call        Pt states bp is still 160-180 over 86-90 despite med adjustments.  Wanted to speak with Dr. Andria Frames about this. Follow-up by: AMY MARTIN RN,  April 27, 2007 9:53 AM  Additional Follow-up for Phone Call Additional follow up Details #1::        Called and discussed.  Will increase ACE Additional Follow-up by: Madison Hickman MD,  April 27, 2007 10:09 AM    New/Updated Medications: BENAZEPRIL HCL 20 MG  TABS (BENAZEPRIL HCL) two by mouth daily

## 2010-03-13 NOTE — Consult Note (Signed)
Summary: [facility]  [facility]   Imported By: Samara Snide 03/15/2008 14:05:14  _____________________________________________________________________  External Attachment:    Type:   Image     Comment:   External Document

## 2010-03-13 NOTE — Letter (Signed)
Summary: Imported By: Eusebio Friendly 05/08/2006 15:30:02   Imported By: Eusebio Friendly 05/08/2006 15:30:02  _____________________________________________________________________  External Attachment:    Type:   Image     Comment:   External Document

## 2010-03-13 NOTE — Progress Notes (Signed)
Summary: Test results   Phone Note Call from Patient Call back at Home Phone [phone]   Reason for Call: Talk to Doctor Summary of Call: pt had procedure done yesterday & wants to be sure MD can get the results & wants to know what the next step is?  Initial call taken by: Samara Snide,  August 23, 2009 8:56 AM  Follow-up for Phone Call        to PCP Follow-up by: Isabelle Course,  August 23, 2009 11:56 AM  Additional Follow-up for Phone Call Additional follow up Details #1::        Results reviewed and discussed with patient.  Will continue on iron.  Repeat CBC in one month.  Future order enterd. Additional Follow-up by: Madison Hickman MD,  August 24, 2009 9:42 AM     Appended Document: Orders Update    Clinical Lists Changes  Orders: Added new Test order of TLB-IBC Pnl (Iron/FE;Transferrin) (83550-IBC) - Signed Added new Test order of TLB-CMP (Comprehensive Metabolic Pnl) (96728-VTVN) - Signed

## 2010-03-13 NOTE — Progress Notes (Signed)
Summary: Referral   Phone Note Call from Patient Call back at Home Phone [phone]   Summary of Call: would like to speak with someone about ENT specialist.  is requesting to know whom we would think would be best. Initial call taken by: Drucie Ip,  December 25, 2007 10:44 AM  Follow-up for Phone Call        called pt for reason of referral. pt reports, that she has had a dry cough, like irritation of her throat for a long time and Dr.Hensel knows about it. I told the pt, that I will ask Dr.Hensel for referral and then call her back.  Follow-up by: Mauricia Area CMA,,  December 25, 2007 10:47 AM  Additional Follow-up for Phone Call Additional follow up Details #1::        My favorite is Dr. Polly Cobia.  Anyone in his group is fine. Additional Follow-up by: Madison Hickman MD,  December 25, 2007 11:54 AM  New Problems: UNSPECIFIED SINUSITIS (ICD-473.9)   Additional Follow-up for Phone Call Additional follow up Details #2::    called pt. informed. pt will sched. her appt and i told the pt that i would fax notes as needed  Follow-up by: Mauricia Area CMA,,  December 25, 2007 1:52 PM  New Problems: UNSPECIFIED SINUSITIS (ICD-473.9)   Appended Document: Referral pt has scheduled an appt for 12/2 with this MD.

## 2010-03-13 NOTE — Miscellaneous (Signed)
Summary: [facility]  Clinical Lists Changes  Observations: Added new observation of ALLERGY REV: Done (08/18/2009 15:00)

## 2010-03-13 NOTE — Progress Notes (Signed)
Summary: Vac  Phone Note Call from Patient Call back at Home Phone [phone]   Summary of Call: pt is wanting to know the price for a shingles vac - has medicare Initial call taken by: Drucie Ip,  May 08, 2006 8:55 AM  Follow-up for Phone Call        pt advised the cost is $248 + admin charge per denya Follow-up by: Alfonse Ras CMA,  May 08, 2006 10:04 AM

## 2010-03-13 NOTE — Progress Notes (Signed)
Summary: Sara Russell   Phone Note Call from Patient Call back at [facility] Phone [phone]   Caller: Patient Summary of Call: Would like to talk to [facility] about the GYN referral. Initial call taken by: Raymond Gurney,  August 15, 2009 11:19 AM  Follow-up for Phone Call        Dr.Hensel will call patient to discuss. appointment scheduled for 08/31/2009 at 1:45 PM at [facility]..  Follow-up by: Marcell Barlow RN,  August 15, 2009 11:27 AM  Additional Follow-up for Phone Call Additional follow up Details #1::        Called and told I am trying to get the appointment pushed up.   Additional Follow-up by: Madison Hickman MD,  August 15, 2009 11:58 AM

## 2010-03-13 NOTE — Procedures (Signed)
Summary: Capsule Endoscopy Report    ORDERED BY: [name], MD   READ BY: Silvano Rusk, MD  REASON FOR REFFERAL: 77 YO FEMALE WITH FE DEFICIENCY ANEMIA, RECENT HOSPITALIZATION - HGB 5.8.  PROCEDURE INFO & FINDINGS: 1)COMPLETE STUDY, GOOD PREP. 2)POLYPOID LESION WITH ERYTHEMATOUS MUCOSA AT 2 HOURS 3 MINUTES. 3)OTHERWISE NORMAL.  SUMMARY& RECOMMENDATIONS: SINCE THIS IS APPROZIMATELY 40 MINUTES FROM THE FIRST DUODENAL IMAGE, IT COULD BE WITHIN REACH OF A STANDARD ENTEROSCOPY, WHICH SHOULD ALLOW BIOPSY OR SNARING OF THIS LESION WHICH COULD BE RESPONSIBLE FOR BLOOD LOSS ANEMIA. SHOULD THAT NOT WORK THEN A DOUBLE BALLOON CAPSULE ENTEROSCOPY WOULD MAKE SENSE.   This report was created from the original endoscopy report, which was reviewed and signed by the above listed endoscopist.  Appended Document: Capsule Endoscopy Report I have duscussed results with the patient. Suspicious polypoid lesion ? proximal? small bowl . Please schedule for Enteroscopy for next week at [facility]. Thanx  Appended Document: Capsule Endoscopy Report Schedule at [facility] endo?  (just making sure correct exam is scheduled)  Appended Document: Capsule Endoscopy Report Yes. Enteroscopy at [facility].  Appended Document: Capsule Endoscopy Report See phone note 08/04/09.

## 2010-03-13 NOTE — Progress Notes (Signed)
Summary: TRIAGE-Enteroscopy Scheduled   Phone Note Call from Patient Call back at Home Phone [phone]   Caller: Patient Call For: BRODIE Reason for Call: Talk to Nurse Summary of Call: Patient wants to speak to [facility] regarding Ultrasound results  Initial call taken by: Ronalee Red,  August 17, 2009 12:10 PM  Follow-up for Phone Call        "I just want to bring Dora up tp date" Had a pelvic Ultrasound yesterday, it is a beign simple cyst.  has a ? for Dr.Brodie-   "Has Dr.Brodie looked at the film from the Capsule study?" "What are we going to do about the scoping of the small bowel?" "Where do we go from here?"  Kite  Follow-up by: Vivia Ewing LPN,  August 18, 7679 15:72 PM  Additional Follow-up for Phone Call Additional follow up Details #1::        I recommend to go ahead with Enteroscopy, to try to visualize the lesion in the small bowl. Additional Follow-up by: Lafayette Dragon MD,  August 18, 2009 8:50 AM    Additional Follow-up for Phone Call Additional follow up Details #2::    Above MD orders reviewed with patient. Pt. will have her previsit today at 3:30pm and her Enteroscopy in [place] on 08-22-09 at 3:30pm. Pt. instructed to call back as needed.  Follow-up by: Vivia Ewing LPN,  August 18, 6201 5:59 AM

## 2010-03-13 NOTE — Miscellaneous (Signed)
       21  22  23  24  25  Allergies Added: * CONTRAST DYE SULFAMETHOXAZOLE (SULFAMETHOXAZOLE) Clinical Lists Changes  Allergies: Added new allergy or adverse reaction of * CONTRAST DYE Added new allergy or adverse reaction of SULFAMETHOXAZOLE (SULFAMETHOXAZOLE) Observations: Added new observation of LLIMPORTALLS: completed (02/27/2007 13:54) Added new observation of NKA: F (02/27/2007 13:54)  Appended Document: Med/Alg Import Medications Added ASPIRIN CHILDRENS 81 MG CHEW (ASPIRIN) Take 1 tablet by mouth once a day BENAZEPRIL HCL 10 MG TABS (BENAZEPRIL HCL) Take 1 tablet by mouth once a day FOSAMAX 70 MG TABS (ALENDRONATE SODIUM) Take 1 tablet by mouth once a week GLUCOSAMINE SULFATE 500 MG TABS (GLUCOSAMINE SULFATE) Take 1 tablet by mouth twice a day HYDROCHLOROTHIAZIDE 12.5 MG CAPS (HYDROCHLOROTHIAZIDE) Take 1 capsule by mouth once a day LOVASTATIN 40 MG TABS (LOVASTATIN) Take 1 tablet by mouth at bedtime METOPROLOL TARTRATE 50 MG TABS (METOPROLOL TARTRATE) Take 1 tablet by mouth twice a day METROGEL 1 % GEL (METRONIDAZOLE) Apply a small amount to affected area at bedtime PRILOSEC OTC 20 MG TBEC (OMEPRAZOLE MAGNESIUM) Take 1 tablet by mouth once a day SM CALCIUM/VITAMIN D 500-200 MG-UNIT TABS (CALCIUM CARBONATE-VITAMIN D) Take 2 tablet by mouth once a day            Current Allergies: * CONTRAST DYE SULFAMETHOXAZOLE (SULFAMETHOXAZOLE)        Complete Medication List: 1)  Aspirin Childrens 81 Mg Chew (Aspirin) .... Take 1 tablet by mouth once a day 2)  Benazepril Hcl 10 Mg Tabs (Benazepril hcl) .... Take 1 tablet by mouth once a day 3)  Fosamax 70 Mg Tabs (Alendronate sodium) .... Take 1 tablet by mouth once a week 4)  Glucosamine Sulfate 500 Mg Tabs (Glucosamine sulfate) .... Take 1 tablet by mouth twice a day 5)  Hydrochlorothiazide 12.5 Mg Caps (Hydrochlorothiazide) .... Take 1 capsule by mouth once a day 6)  Lovastatin 40 Mg Tabs (Lovastatin) .... Take 1  tablet by mouth at bedtime 7)  Metoprolol Tartrate 50 Mg Tabs (Metoprolol tartrate) .... Take 1 tablet by mouth twice a day 8)  Metrogel 1 % Gel (Metronidazole) .... Apply a small amount to affected area at bedtime 9)  Prilosec Otc 20 Mg Tbec (Omeprazole magnesium) .... Take 1 tablet by mouth once a day 10)  Sm Calcium/vitamin D 500-200 Mg-unit Tabs (Calcium carbonate-vitamin d) .... Take 2 tablet by mouth once a day     ]

## 2010-03-13 NOTE — Progress Notes (Signed)
Summary: phn msg   Phone Note Call from Patient Call back at [facility] Phone [phone]   Caller: Patient Summary of Call: has question about drug Fosamax. Initial call taken by: Raymond Gurney,  November 28, 2008 11:12 AM  Follow-up for Phone Call        patient states she has been on Fosomax for 8 years. states she has not had a bone density scan in 5 years and was wondering if she could have a test ordered. advised will send meesage to MD. Follow-up by: Marcell Barlow RN,  November 28, 2008 3:01 PM  Additional Follow-up for Phone Call Additional follow up Details #1::        Called and discussed.  I usually don't order repeat tests because it does not change my treatment.  She would like to kow where she stands.  Order entered. Additional Follow-up by: Madison Hickman MD,  November 28, 2008 4:05 PM    Additional Follow-up for Phone Call Additional follow up Details #2::    pt returned call to [facility].  Just needs to know where she goes for test. Follow-up by: Raymond Gurney,  November 29, 2008 11:33 AM  Additional Follow-up for Phone Call Additional follow up Details #3:: Details for Additional Follow-up Action Taken: patient notified of date and time and place of appointment. Additional Follow-up by: Marcell Barlow RN,  November 29, 2008 1:59 PM

## 2010-03-13 NOTE — Letter (Signed)
Summary: EGD Instructions  [facility]  [place], [place] [address]   Phone: [phone]  Fax: [phone]       Sara Russell    [birth year]    MRN: [id number]       Procedure Day Sudie Grumbling:  tuesday 08/22/2009     Arrival Time:  2:30 pm     Procedure Time: 3:30 pm     Location of Procedure:                    _x  _ [facility] (4th Floor)    PREPARATION FOR ENDOSCOPY   On Tuesday 7/12 THE DAY OF THE PROCEDURE:  1.   No solid foods, milk or milk products are allowed after midnight the night before your procedure.  2.   Do not drink anything colored red or purple.  Avoid juices with pulp.  No orange juice.  3.  You may drink clear liquids until 1:30 pm, which is 2 hours before your procedure.                                                                                                CLEAR LIQUIDS INCLUDE: Water Jello Ice Popsicles Tea (sugar ok, no milk/cream) Powdered fruit flavored drinks Coffee (sugar ok, no milk/cream) Gatorade Juice: apple, white grape, white cranberry  Lemonade Clear bullion, consomm, broth Carbonated beverages (any kind) Strained chicken noodle soup Hard Candy   MEDICATION INSTRUCTIONS  Unless otherwise instructed, you should take regular prescription medications with a small sip of water as early as possible the morning of your procedure.    Additional medication instructions:  Do not take HCTZ day of procedure.             OTHER INSTRUCTIONS  You will need a responsible adult at least 75 years of age to accompany you and drive you home.   This person must remain in the waiting room during your procedure.  Wear loose fitting clothing that is easily removed.  Leave jewelry and other valuables at home.  However, you may wish to bring a book to read or an iPod/MP3 player to listen to music as you wait for your procedure to start.  Remove all body piercing jewelry and leave at  home.  Total time from sign-in until discharge is approximately 2-3 hours.  You should go home directly after your procedure and rest.  You can resume normal activities the day after your procedure.  The day of your procedure you should not:   Drive   Make legal decisions   Operate machinery   Drink alcohol   Return to work  You will receive specific instructions about eating, activities and medications before you leave.    The above instructions have been reviewed and explained to me by   Ulice Dash RN  August 18, 2009 3:38 PM     I fully understand and can verbalize these instructions _____________________________ Date _________

## 2010-03-13 NOTE — Assessment & Plan Note (Signed)
Summary: flu shot,df   Nurse Visit   Vital Signs:  Patient profile:   77 year old female Temp:     97.8 degrees F  Vitals Entered By: Marcell Barlow RN (November 16, 2008 9:44 AM)  Allergies: 1)  * Contrast Dye 2)  Sulfamethoxazole (Sulfamethoxazole)  Immunizations Administered:  Influenza Vaccine # 1:    Vaccine Type: Fluvax MCR    Site: left deltoid    Mfr: GlaxoSmithKline    Dose: 0.5 ml    Route: IM    Given by: Marcell Barlow RN    Exp. Date: 08/10/2009    Lot #: [id number]    VIS given: 09/20/2008.  Flu Vaccine Consent Questions:    Do you have a history of severe allergic reactions to this vaccine? no    Any prior history of allergic reactions to egg and/or gelatin? no    Do you have a sensitivity to the preservative Thimersol? no    Do you have a past history of Guillan-Barre Syndrome? no    Do you currently have an acute febrile illness? no    Have you ever had a severe reaction to latex? no    Vaccine information given and explained to patient? yes    Are you currently pregnant? no  Orders Added: 1)  Influenza Vaccine MCR [00025] 2)  Administration Flu vaccine - MCR [V6720]     Vital Signs:  Patient profile:   77 year old female Temp:     97.8 degrees F  Vitals Entered By: Marcell Barlow RN (November 16, 2008 9:44 AM)

## 2010-03-13 NOTE — Miscellaneous (Signed)
Summary: Hospital Information  Medications Added METOPROLOL TARTRATE 25 MG TABS (METOPROLOL TARTRATE) one by mouth bid PRILOSEC OTC 20 MG TBEC (OMEPRAZOLE MAGNESIUM) Take 1 tablet by mouth twice a day FERROUS SULFATE 325 (65 FE) MG  TBEC (FERROUS SULFATE) three times a day OTC       Clinical Lists Changes Hospitalized for anemia - presumed iron deficient from chronic GI loss.  Required 2 unit transfusion.  Needs GI referral.  Also meds changed.  ASA and fosamax DCed.  Metoprolol decreased due to borderline bradycardia and 1st degree heart block. Problems: Removed problem of DYSPNEA (ICD-786.09) Added new problem of ANEMIA, DUE TO BLOOD LOSS (ICD-280.0) Medications: Removed medication of FOSAMAX 70 MG TABS (ALENDRONATE SODIUM) Take 1 tablet by mouth once a week Changed medication from METOPROLOL TARTRATE 50 MG TABS (METOPROLOL TARTRATE) Take 1 tablet by mouth twice a day to METOPROLOL TARTRATE 25 MG TABS (METOPROLOL TARTRATE) one by mouth bid - Signed Removed medication of ASPIRIN CHILDRENS 81 MG CHEW (ASPIRIN) Take 1 tablet by mouth once a day Changed medication from PRILOSEC OTC 20 MG TBEC (OMEPRAZOLE MAGNESIUM) Take 1 tablet by mouth once a day to PRILOSEC OTC 20 MG TBEC (OMEPRAZOLE MAGNESIUM) Take 1 tablet by mouth twice a day - Signed Added new medication of FERROUS SULFATE 325 (65 FE) MG  TBEC (FERROUS SULFATE) three times a day OTC - Signed Rx of METOPROLOL TARTRATE 25 MG TABS (METOPROLOL TARTRATE) one by mouth bid;  #90 x 3;  Signed;  Entered by: Madison Hickman MD;  Authorized by: Madison Hickman MD;  Method used: Handwritten Rx of PRILOSEC OTC 20 MG TBEC (OMEPRAZOLE MAGNESIUM) Take 1 tablet by mouth twice a day;  #180 x 3;  Signed;  Entered by: Madison Hickman MD;  Authorized by: Madison Hickman MD;  Method used: Handwritten Rx of FERROUS SULFATE 325 (65 FE) MG  TBEC (FERROUS SULFATE) three times a day OTC;  #0 x 0;  Signed;  Entered by: Madison Hickman MD;  Authorized by: Madison Hickman  MD;  Method used: Historical Orders: Added new Referral order of Gastroenterology Referral (GI) - Signed    Prescriptions: FERROUS SULFATE 325 (65 FE) MG  TBEC (FERROUS SULFATE) three times a day OTC  #0 x 0   Entered and Authorized by:   Madison Hickman MD   Signed by:   Madison Hickman MD on 07/07/2009   Method used:   Historical   RxID:   [id number] PRILOSEC OTC 20 MG TBEC (OMEPRAZOLE MAGNESIUM) Take 1 tablet by mouth twice a day  #180 x 3   Entered and Authorized by:   Madison Hickman MD   Signed by:   Madison Hickman MD on 07/07/2009   Method used:   Handwritten   RxID:   [id number] METOPROLOL TARTRATE 25 MG TABS (METOPROLOL TARTRATE) one by mouth bid  #90 x 3   Entered and Authorized by:   Madison Hickman MD   Signed by:   Madison Hickman MD on 07/07/2009   Method used:   Handwritten   RxID:   [id number]

## 2010-03-13 NOTE — Procedures (Signed)
Summary: Upper Endoscopy  Patient: [name] Note: All result statuses are Final unless otherwise noted.  Tests: (1) Upper Endoscopy (EGD)   EGD Upper Endoscopy       [facility]     [place], [place]  [address]           ENDOSCOPY PROCEDURE REPORT           PATIENT:  Sara Russell, Sara Russell  MR#:  [id number]     BIRTHDATE:  [birth year], 12 yrs. old  GENDER:  female           ENDOSCOPIST:  Lowella Bandy. Olevia Perches, MD     Referred by:  Madison Hickman, M.D.           PROCEDURE DATE:  08/22/2009     PROCEDURE:  enteroscopy 44360     ASA CLASS:  Class II     INDICATIONS:  iron deficiency anemia abnormal SBCE suggestive of a     ?"bulge" in the jejunum 40 min distal from the duodenum, Hgb 8.9,     MCV 63,neg, colonoscopy     CT enterograpgy was normal           MEDICATIONS:   Versed 7 mg, Fentanyl 10 mcg     TOPICAL ANESTHETIC:  Exactacain Spray           DESCRIPTION OF PROCEDURE:   After the risks benefits and     alternatives of the procedure were thoroughly explained, informed     consent was obtained.  The [facility] [id number] endoscope was     introduced through the mouth and advanced to the proximal jejunum,     without limitations.  The instrument was slowly withdrawn as the     mucosa was fully examined.     <<PROCEDUREIMAGES>>           A hiatal hernia was found (see image7 and image8). 3 cm     nonreducible hiatal hernia  There were multiple polyps identified.     multiple fundic gland polyps With standard forceps, a biopsy was     obtained and sent to pathology (see image6 and image7). small bowl     biopsy  Otherwise the examination was normal (see image1, image2,     image3, image4, and image5).    Retroflexed views revealed no     abnormalities.    The scope was then withdrawn from the patient     and the procedure completed.           COMPLICATIONS:  None           ENDOSCOPIC IMPRESSION:     1) Hiatal hernia     2) Polyps, multiple   3) Otherwise normal examination     RECOMMENDATIONS:     hematology consultation           REPEAT EXAM:  In 0 year(s) for.           ______________________________     Lowella Bandy. Olevia Perches, MD           CC:           n.     eSIGNED:   Lowella Bandy. Davell Beckstead at 08/22/2009 04:21 PM           Antonieta Loveless, [phone]  Note: An exclamation mark (!) indicates a result that was not dispersed into the flowsheet. Document Creation Date: 08/22/2009 4:22 PM _______________________________________________________________________  Marland Kitchen  1) Order result status: Final Collection or observation date-time: 08/22/2009 16:07 Requested date-time:  Receipt date-time:  Reported date-time:  Referring Physician:   Ordering Physician: [name] [phone]) Specimen Source:  Source: Tawanna Cooler Order Number: [id number] Lab site:   Appended Document: Upper Endoscopy    Clinical Lists Changes

## 2010-03-13 NOTE — Assessment & Plan Note (Signed)
Summary: fu wp   Vital Signs:  Patient Profile:   77 Years Old Female Weight:      197 pounds Pulse rate:   62 / minute BP sitting:   180 / 80  Vitals Entered By: neeton                 Chief Complaint:  annual and rf's.  History of Present Illness: BP is up.  Home BPs have also been on the high side.  Has vague discomfort on left forehead/scalp anterior.  Sx are mild and intermitant and don't require meds.  No visual changes.  No fever.  Some slight cough.  Snores loudly but not stop breathing  Last Flex Sig:  Done. (01/11/2006 12:00:00 AM) Flex Sig Next Due:  Not Indicated Last Colonoscopy:  Done. (01/11/2006 12:00:00 AM) Colonoscopy Next Due:  5 yr Last Hemoccult Result: Done. (03/14/2004 12:00:00 AM) Hemoccult Next Due:  Not Indicated Last PAP:  Done. (02/12/2003 12:00:00 AM) PAP Next Due:  Not Indicated Last Mammogram:  Done. (03/14/2006 12:00:00 AM) Mammogram Result Date:  04/03/2007 Mammogram Result:  normal Mammogram Next Due:  1 yr    Current Allergies: * CONTRAST DYE SULFAMETHOXAZOLE (SULFAMETHOXAZOLE)  Past Medical History:    Reviewed history from 04/10/2006 and no changes required:       eophageal stricture  Past Surgical History:    Reviewed history from 04/10/2006 and no changes required:       Colonoscopy -, D&C/hysteroscopy 5/01 endometrial polyps - 09/25/1999, endometrial bx 10/00 benign -, Lipid panel: TC-197, TG-225, HDL-59, LDL-93 - 04/01/2006, Lt knee replacement - 04/11/2005   Family History:    Reviewed history from 04/10/2006 and no changes required:       CAD, CVA, cancer  Social History:    Reviewed history from 04/10/2006 and no changes required:       nonsmoker; retired Marine scientist; ETOH insignificant       Diet generally healthy       Exercise walks one mile five times per week   Risk Factors:  Mammogram History:     Date of Last Mammogram:  04/03/2007  Mammogram History:     Date of Last Mammogram:  04/03/2007    Results:   normal    Physical Exam  General:     Well-developed,well-nourished,in no acute distress; alert,appropriate and cooperative throughout examination Head:     Normocephalic and atraumatic without obvious abnormalities. No apparent alopecia or balding. Eyes:     No corneal or conjunctival inflammation noted. EOMI. Perrla. Funduscopic exam benign, without hemorrhages, exudates or papilledema. Vision grossly normal. Mouth:     Oral mucosa and oropharynx without lesions or exudates.  Teeth in good repair. Neck:     No deformities, masses, or tenderness noted. Lungs:     Normal respiratory effort, chest expands symmetrically. Lungs are clear to auscultation, no crackles or wheezes. Heart:     Normal rate and regular rhythm. S1 and S2 normal without gallop, murmur, click, rub or other extra sounds. Abdomen:     Bowel sounds positive,abdomen soft and non-tender without masses, organomegaly or hernias noted.    Impression & Recommendations:  Problem # 1:  HYPERCHOLESTEROLEMIA (ICD-272.0) Will check labs Her updated medication list for this problem includes:    Lovastatin 40 Mg Tabs (Lovastatin) .Marland Kitchen... Take 1 tablet by mouth at bedtime  Orders: [facility]- Est  Level 4 (63845)  Future Orders: Comp Met-[facility] ([id number]) ... 03/14/2008 Lipid-[facility] ([id number]) ... 03/14/2008 TSH-[facility] ([id number]) ... 03/14/2008  Problem # 2:  HYPERTENSION, BENIGN SYSTEMIC (ICD-401.1) Poorly controled.  Will increase ACE and she will check home BP Her updated medication list for this problem includes:    Benazepril Hcl 20 Mg Tabs (Benazepril hcl) ..... One by mouth daily    Hydrochlorothiazide 12.5 Mg Caps (Hydrochlorothiazide) .Marland Kitchen... Take 1 capsule by mouth once a day    Metoprolol Tartrate 50 Mg Tabs (Metoprolol tartrate) .Marland Kitchen... Take 1 tablet by mouth twice a day  Orders: [facility]- Est  Level 4 (28833)  Future Orders: Comp Met-[facility] ([id number]) ... 03/14/2008 CBC-[facility] ([id number]) ... 03/14/2008 TSH-[facility]  ([id number]) ... 03/14/2008   Problem # 3:  HEADACHE (ICD-784.0)  Her updated medication list for this problem includes:    Aspirin Childrens 81 Mg Chew (Aspirin) .Marland Kitchen... Take 1 tablet by mouth once a day    Metoprolol Tartrate 50 Mg Tabs (Metoprolol tartrate) .Marland Kitchen... Take 1 tablet by mouth twice a day  Orders: [facility]- Est  Level 4 (85927)  Future Orders: Sed Rate (ESR)-[facility] [id number]) ... 03/14/2008   Complete Medication List: 1)  Aspirin Childrens 81 Mg Chew (Aspirin) .... Take 1 tablet by mouth once a day 2)  Benazepril Hcl 20 Mg Tabs (Benazepril hcl) .... One by mouth daily 3)  Fosamax 70 Mg Tabs (Alendronate sodium) .... Take 1 tablet by mouth once a week 4)  Glucosamine Sulfate 500 Mg Tabs (Glucosamine sulfate) .... Take 1 tablet by mouth twice a day 5)  Hydrochlorothiazide 12.5 Mg Caps (Hydrochlorothiazide) .... Take 1 capsule by mouth once a day 6)  Lovastatin 40 Mg Tabs (Lovastatin) .... Take 1 tablet by mouth at bedtime 7)  Metoprolol Tartrate 50 Mg Tabs (Metoprolol tartrate) .... Take 1 tablet by mouth twice a day 8)  Metrogel 1 % Gel (Metronidazole) .... Apply a small amount to affected area at bedtime. disp 60 gm 9)  Prilosec Otc 20 Mg Tbec (Omeprazole magnesium) .... Take 1 tablet by mouth once a day 10)  Sm Calcium/vitamin D 500-200 Mg-unit Tabs (Calcium carbonate-vitamin d) .... Take 2 tablet by mouth once a day     Prescriptions: METROGEL 1 % GEL (METRONIDAZOLE) Apply a small amount to affected area at bedtime. Disp 60 gm  #60 x 3   Entered and Authorized by:   Madison Hickman MD   Signed by:   Madison Hickman MD on 04/03/2007   Method used:   Print then Give to Patient   RxID:   [id number] BENAZEPRIL HCL 20 MG  TABS (BENAZEPRIL HCL) one by mouth daily  #90 x 3   Entered and Authorized by:   Madison Hickman MD   Signed by:   Madison Hickman MD on 04/03/2007   Method used:   Print then Give to Patient   RxID:   [id number]  ]

## 2010-03-13 NOTE — Progress Notes (Signed)
Summary: CAPSULE ENDOSCOPY SCHEDULED   Phone Note Other Incoming   Caller: Dr.Yaniv Lage Summary of Call: Pt. is in [place] recovery and she needs to be scheduled for a Capsule Endoscopy. Cap.Endo. is scheduled for 07-27-09 at 8am. Instructions given to pt/family by Erasmo Downer RN in [facility] recovery. Pt. instructed to call back as needed.  Initial call taken by: Vivia Ewing LPN,  July 22, 9602 5:40 PM

## 2010-03-13 NOTE — Assessment & Plan Note (Signed)
Summary: Hospital H&P - Anemia   Primary Care Provider:  Madison Hickman MD   History of Present Illness: 77 year old WF presented to [facility] yesterday with CC: SOB. Labs obtained at that visit revealed a Hgb of 6.0 with MCV 61. Last recorded Hgb was 13.4 with MCV 86.3 on 04/2008. The patient denies HA, dizziness, CP, SOB, N/V, change in bowel movements, hemorroids, melena, BRBPR, vaginal bleeding, abdominal pain, LE edema, leg pain, back pain, fever/chills, night sweats. She endorses an 8-9 pound unintentional weight loss over the past year. She endorses only SOB with exertion. She states "I'm healthy otherwise." She states that she is up-to-date on preventive tests including mammogram and colonoscopy - showed a single benign polyp. She has a PMHx HTN, HLD, FeSo4 Anemia in her 68s that resolved with FeSO4 supplementation, esophageal stricture, and "cysts in the back of her throat, vocal chords, and tongue" that were removed in 2009 by ENT. She also had a brother that died from esophageal cancer.  Current Medications (verified): 1)  Aspirin Childrens 81 Mg Chew (Aspirin) .... Take 1 Tablet By Mouth Once A Day 2)  Benazepril Hcl 40 Mg  Tabs (Benazepril Hcl) .... One Tab Daily 3)  Fosamax 70 Mg Tabs (Alendronate Sodium) .... Take 1 Tablet By Mouth Once A Week 4)  Glucosamine Sulfate 500 Mg Tabs (Glucosamine Sulfate) .... Take 1 Tablet By Mouth Twice A Day 5)  Hydrochlorothiazide 12.5 Mg  Tabs (Hydrochlorothiazide) .... Take 1 Tab  By Mouth Every Morning 6)  Simvastatin 40 Mg Tabs (Simvastatin) .... Take One Tablet At Bedtime 7)  Metoprolol Tartrate 50 Mg Tabs (Metoprolol Tartrate) .... Take 1 Tablet By Mouth Twice A Day 8)  Metrogel 1 % Gel (Metronidazole) .... Apply A Small Amount To Affected Area At Bedtime. Disp 60 Gm 9)  Prilosec Otc 20 Mg Tbec (Omeprazole Magnesium) .... Take 1 Tablet By Mouth Once A Day 10)  Sm Calcium/vitamin D 500-200 Mg-Unit Tabs (Calcium Carbonate-Vitamin D) .... Take 2 Tablet  By Mouth Once A Day 11)  Amlodipine Besylate 10 Mg  Tabs (Amlodipine Besylate) .... One By Mouth Daily  Allergies (verified): 1)  * Contrast Dye 2)  Sulfamethoxazole (Sulfamethoxazole)  Past History:  Past medical, surgical, family and social histories (including risk factors) reviewed, and no changes noted (except as noted below).  Past Medical History: Esophageal Stricture HTN HLD Hx DVT 2009  Past Surgical History: Colonoscopy D&C/Hysteroscopy 5/01 endometrial polyps - 09/25/1999, endometrial bx 10/00 benign  Lipid panel: TC-197, TG-225, HDL-59, LDL-93 - 04/01/2006 Lt knee replacement - 04/11/2005  Family History: Reviewed history from 04/10/2006 and no changes required. CAD, CVA, cancer  Social History: Reviewed history from 04/03/2007 and no changes required. nonsmoker; retired Marine scientist; ETOH insignificant Diet generally healthy Exercise walks one mile five times per week  Review of Systems      See HPI  Physical Exam  General:  T 97.1 BP 139/69 HR 80 RR 20 O2100%RA. Alert, pale. Head:  Normocephalic and atraumatic without obvious abnormalities.  Eyes:  EOMI. Perrla. Vision grossly normal. Pale conjunctiva. Mouth:  Oral mucosa and oropharynx without lesions or exudates.   Neck:  No deformities, masses, or tenderness noted. Lungs:  Normal respiratory effort, chest expands symmetrically. Lungs are clear to auscultation, no crackles or wheezes. Heart:  Normal rate and regular rhythm. S1 and S2 normal without gallop, murmur, click, rub or other extra sounds. Abdomen:  Bowel sounds positive,abdomen soft and non-tender without masses, organomegaly or hernias noted. Pulses:  2+ RLE,  1+ LLE. Extremities:  No edema. Neurologic:  No cranial nerve deficits noted. Sensory, motor and coordinative functions appear intact. Skin:  Intact without suspicious lesions or rashes. Psych:  Oriented X3, memory intact for recent and remote, and good eye contact.   Additional Exam:  Hgb 5.7  (6.0 yesterday), MCV 60 Cr 1.22 Na 129    Complete Medication List: 1)  Aspirin Childrens 81 Mg Chew (Aspirin) .... Take 1 tablet by mouth once a day 2)  Benazepril Hcl 40 Mg Tabs (Benazepril hcl) .... One tab daily 3)  Fosamax 70 Mg Tabs (Alendronate sodium) .... Take 1 tablet by mouth once a week 4)  Glucosamine Sulfate 500 Mg Tabs (Glucosamine sulfate) .... Take 1 tablet by mouth twice a day 5)  Hydrochlorothiazide 12.5 Mg Tabs (Hydrochlorothiazide) .... Take 1 tab  by mouth every morning 6)  Simvastatin 40 Mg Tabs (Simvastatin) .... Take one tablet at bedtime 7)  Metoprolol Tartrate 50 Mg Tabs (Metoprolol tartrate) .... Take 1 tablet by mouth twice a day 8)  Metrogel 1 % Gel (Metronidazole) .... Apply a small amount to affected area at bedtime. disp 60 gm 9)  Prilosec Otc 20 Mg Tbec (Omeprazole magnesium) .... Take 1 tablet by mouth once a day 10)  Sm Calcium/vitamin D 500-200 Mg-unit Tabs (Calcium carbonate-vitamin d) .... Take 2 tablet by mouth once a day 11)  Amlodipine Besylate 10 Mg Tabs (Amlodipine besylate) .... One by mouth daily   77 year old female:  1. Anemia: Stable, though symptomatic with dyspnea, with low MCV at 60. All other labs WNL. No anemia panel available for review. FOB negative in ED and patient denies any sources of bleed. Hgb stable since yesterday. Will admit for transfusion, monitoring of Hgb, and work-up of etiology. Patient noted to have esophageal stricture s/p dilation, posterior pharynx cysts, FeSO4 anemia, family Hx of esophageal cancer, unintentional weight loss, all of which make the Dx of Plummer-Vinson Syndrome higher on the DDx.   2. HTN: Continue home medications. Reassuring that BP stable at this time.  3. HLD: Continue home medications.  4. FEN/GI: MIVF, Transfusions. Clear liquids.  5. PPX: Hold anticoagulation until anemia etiology uncovered. SCDs. High-dose PPI.  6. DISPO: Pending work-up.  H&P co-written by Dr. Juleen China and Dr.  Tamala Julian.

## 2010-03-13 NOTE — Procedures (Signed)
Summary: Capsule Endoscopy / [facility]  Capsule Endoscopy / [facility]   Imported By: Rise Patience 08/17/2009 13:53:59  _____________________________________________________________________  External Attachment:    Type:   Image     Comment:   External Document

## 2010-03-13 NOTE — Progress Notes (Signed)
Summary: CT ENTEROSCOPY SCHEDULED  Medications Added PREDNISONE 50 MG TABS (PREDNISONE) Take 1 at 9:30pm on 08-09-09.  Take 1 at 3:30am on 08-10-09. Take 1 at 9:30am on 08-10-09.       Phone Note Call from Patient Call back at Home Phone [phone]   Caller: Patient Call For: Dr. Olevia Perches Reason for Call: Talk to Nurse Summary of Call: would like to discuss CT scan and allergies Initial call taken by: Lucien Mons,  August 04, 2009 3:58 PM  Follow-up for Phone Call        Pt states she talked with Dr. Olevia Perches earlier and discussed capsule findings.  Pt wanted Dr. Olevia Perches to know that she has an allergic reaction to IVP dye over 30 yrs ago, later had  the dye again but took benadryl prior to inj and she did fine.  Pt states she would prefer to do a scan (CT or MRI) and try to locate this lesion a little better before having a scope test. Follow-up by: Alberteen Spindle RN,  August 04, 2009 4:26 PM  Additional Follow-up for Phone Call Additional follow up Details #1::        OK, then It is  acceptable to cancel enteroscopy and schedule CT enterography, " proximal small bowl lesion on SBCE" for next week. She will need a Protocol for CT scan  allergy to IV contrast. They have a protocol which includes Prednisone and Benadryl prior to the procedure.Please let pt know this. Additional Follow-up by: Lafayette Dragon MD,  August 04, 2009 10:43 PM    Additional Follow-up for Phone Call Additional follow up Details #2::    CT Enterography is scheduled at [facility] on 08-10-09 at 10:30am, pt. to arrive in radiology by 9am, NPO after 6:30am. She will have labs drawn today or tomorrow at [facility]. She is allergic to IV dye, she has been instructed on the IV allergy Protocol: Prednisone 30m 13 hours prior-9:30pm on 08-09-09, Prednisone 570m7 hours prior-3:30am on 08-10-09 and Prednisone 5066menadryl 45m40mhour prior-9:30am on 08-10-09. Pt. instructed to call back as needed.  Follow-up by: DeboVivia Ewing,  June 27,  [id number] AM  New/Updated Medications: PREDNISONE 50 MG TABS (PREDNISONE) Take 1 at 9:30pm on 08-09-09.  Take 1 at 3:30am on 08-10-09. Take 1 at 9:30am on 08-10-09. Prescriptions: PREDNISONE 50 MG TABS (PREDNISONE) Take 1 at 9:30pm on 08-09-09.  Take 1 at 3:30am on 08-10-09. Take 1 at 9:30am on 08-10-09.  #3 x 0   Entered by:   DeboVivia Ewing   Authorized by:   DoraLafayette Dragon  Signed by:   DeboVivia Ewing on 06/283/38/2505ethod used:   Electronically to        [facility] (retail)       [address]   [place]  [id number]   Ph: [phone]   Fax: [phone]:   [id number]

## 2010-03-13 NOTE — Progress Notes (Signed)
Summary: Pelvic ultrasound?  Medications Added SIMVASTATIN 20 MG TABS (SIMVASTATIN) one by mouth daily       Phone Note Call from Patient Call back at [phone]   Reason for Call: Talk to Nurse Summary of Call: pt wants to know if she is due for another pelvic ultrasound?  Initial call taken by: Samara Snide,  January 12, 2010 2:11 PM  Follow-up for Phone Call        pt stated that she is to have a pelvic ct done every 3-79month she stated that she had some blood work done and her ca125 was elevated.   Follow-up by: TAudelia HivesCMA,  January 12, 2010 4:01 PM  Additional Follow-up for Phone Call Additional follow up Details #1::        pt calling back, was informed by pharmacy her medication  amlodipine has drug reaction with simvastatin with the dosage she is takiing, wants to know if MD wants her to continue the dosage currently prescribed or should she reduce it? Additional Follow-up by: ESamara Snide  January 15, 2010 10:21 AM    Additional Follow-up for Phone Call Additional follow up Details #2::    fwd to pcp Follow-up by: SEnid Skeens CMA,  January 15, 2010 1:47 PM  Additional Follow-up for Phone Call Additional follow up Details #3:: Details for Additional Follow-up Action Taken: Called and discusssed.  Radiology recommended repeat ultrasound 1 year.  Apparently consultant recommended 6 month fu.  Will order in Jan. Lowered dose of simvastatin to 20 mg per new recs.  We have room on Cholesterol and this should be fine.   She is doing OK with grief from recent death of husband.   Additional Follow-up by: WMadison HickmanMD,  January 15, 2010 3:57 PM  New/Updated Medications: SIMVASTATIN 20 MG TABS (SIMVASTATIN) one by mouth daily Prescriptions: SIMVASTATIN 20 MG TABS (SIMVASTATIN) one by mouth daily  #90 x 3   Entered and Authorized by:   WMadison HickmanMD   Signed by:   WMadison HickmanMD on 01/15/2010   Method used:   [facility]       [address]      [place]  [id number]      Ph: [phone]      Fax: [phone]  RxID:   [id number]

## 2010-03-13 NOTE — Assessment & Plan Note (Signed)
Summary: ANEMIA & ESOPHAGITIS REFLUX/YF       (DR.BRODIE PT.)    History of Present Illness Visit Type: Initial Consult Primary GI MD: [name] MD Primary Provider: Madison Hickman MD Requesting Provider: Madison Hickman MD Chief Complaint: anemia and esophagitis reflux History of Present Illness:   PLEASANT 77 YO FEMALE KNOWN TO DR. Olevia Perches WITH HX OF GERD AND ESOPHAGEAL STRICTURE-LAST EGD IN 2002. SHE HAD A COLONOSCOPY IN 2007 WHICH SHOWED A DIMUNITIVE POLYP AND DIVERTICULOSIS. SHE COMES IN TODAY FOR EVALUATION OF ANEMIA /FE DEFICIENCY. SHE WAS HOSPITALIZED AT [facility] A WEEK AGO. SHE WAS C/O EXERTIONAL DYSPNEA, AND LABS SHOWED A HGB 5.8,MCV OF 61. SHE WAS ON THE MEDICINE SERVICE.SHE WAS TRANSFUSED 2 UNITS,HAD FE STUDIES DONE POST TRANSFUSION. APPARENTLY STOOL WAS NEGATIVE FOR BLOOD SO GI EVAL SCHEDULED AS OUTPATIENT. SHE HAS NO C/O HEARTBURN,INDIGESTION,OR DYSPHAGIA. SHE HAS BEEN ON OMEPRAZOLE LONG TERM AND THIS WAS INCREASED TO TWICE DAILY. NO C/O CHANGES IN BOWEL HABITS,MELENA OR HEME. SHE HAD BEEN ON AN ASA WHICH WAS STOPPED,NO NSAID USE.  HGB 8.5 ON DISCHARGE. HGB IN 3/10 WAS 13.4/MCV 86  SHE HAS ALSO HAS A VOCAL CORD CYST REMOVED PER ENT  JAN/2011, THINKS IT HAS RECURRED WITH RECURRENT HOARSENESS.           Current Medications (verified): 1)  Benazepril Hcl 40 Mg  Tabs (Benazepril Hcl) .... One Tab Daily 2)  Glucosamine Sulfate 500 Mg Tabs (Glucosamine Sulfate) .... Take 1 Tablet By Mouth Twice A Day 3)  Hydrochlorothiazide 12.5 Mg  Tabs (Hydrochlorothiazide) .... Take 1 Tab  By Mouth Every Morning 4)  Simvastatin 40 Mg Tabs (Simvastatin) .... Take One Tablet At Bedtime 5)  Metoprolol Tartrate 25 Mg Tabs (Metoprolol Tartrate) .... One By Mouth Bid 6)  Metrogel 1 % Gel (Metronidazole) .... Apply A Small Amount To Affected Area At Bedtime. Disp 60 Gm 7)  Prilosec Otc 20 Mg Tbec (Omeprazole Magnesium) .... Take 1 Tablet By Mouth Twice A Day 8)  Sm Calcium/vitamin D 500-200 Mg-Unit Tabs  (Calcium Carbonate-Vitamin D) .... 600 Mg By Mouth Two Times A Day 9)  Amlodipine Besylate 10 Mg  Tabs (Amlodipine Besylate) .... One By Mouth Daily 10)  Ferrous Sulfate 325 (65 Fe) Mg  Tbec (Ferrous Sulfate) .Marland Kitchen.. 1 By Mouth Two Times A Day  Allergies (verified): 1)  * Contrast Dye 2)  Sulfamethoxazole (Sulfamethoxazole)  Past History:  Past Medical History: Esophageal Stricture/DILATED 2002 COLON POLYPS-HYPERPLASTIC 2007 DIVERTICULOSIS HTN HLD Hx DVT 2009/AND PE  Past Surgical History: Reviewed history from 07/06/2009 and no changes required. Colonoscopy D&C/Hysteroscopy 5/01 endometrial polyps - 09/25/1999, endometrial bx 10/00 benign  Lipid panel: TC-197, TG-225, HDL-59, LDL-93 - 04/01/2006 Lt knee replacement - 04/11/2005  Family History: Reviewed history from 04/10/2006 and no changes required. CAD, CVA, cancer  Social History: Reviewed history from 04/03/2007 and no changes required. nonsmoker; retired Marine scientist; ETOH insignificant Diet generally healthy Exercise walks one mile five times per week  Review of Systems       The patient complains of anemia, fatigue, and voice change.  The patient denies allergy/sinus, anxiety-new, arthritis/joint pain, back pain, blood in urine, breast changes/lumps, change in vision, confusion, cough, coughing up blood, depression-new, fainting, fever, headaches-new, hearing problems, heart murmur, heart rhythm changes, itching, menstrual pain, muscle pains/cramps, night sweats, nosebleeds, pregnancy symptoms, shortness of breath, skin rash, sleeping problems, sore throat, swelling of feet/legs, swollen lymph glands, thirst - excessive, urination - excessive, urination changes/pain, urine leakage, and vision changes.  ROS OTHERWISE AS IN HPI  Vital Signs:  Patient profile:   77 year old female Height:      62.5 inches Weight:      186 pounds BMI:     33.60 Pulse rate:   72 / minute Pulse rhythm:   regular BP sitting:   138 / 60   (right arm)  Vitals Entered By: Christian Mate CMA Deborra Medina) (Jul 11, 2009 1:52 PM)  Physical Exam  General:  Well developed, well nourished, no acute distress. Head:  Normocephalic and atraumatic. Eyes:  PERRLA, no icterus. Neck:  Supple; no masses or thyromegaly. Lungs:  Clear throughout to auscultation. Heart:  Regular rate and rhythm; no murmurs, rubs,  or bruits. Abdomen:  SOFT, NONTENDER, NO MASS OR HSM,BS+ Rectal:  HEME NEGATIVE STOOL Extremities:  No clubbing, cyanosis, edema or deformities noted. Neurologic:  Alert and  oriented x4;  grossly normal neurologically. Psych:  Alert and cooperative. Normal mood and affect.   Impression & Recommendations:  Problem # 1:  ANEMIA-IRON DEFICIENCY (ICD-280.9) Assessment New 77 YO FEMALE WITH NEW FE DEFICIENCY ANEMIA, HEME NEGATIVE STOOL. RELATIVELY RECENT COLONOSCOPY12/07. SOURCE IS NOT CLEAR THIS MAY BE DUE TO  CHRONIC INTERMITTENT BLOOD LOSS OR A MYELODYSPLASTIC PICTURE.  CONTINUE FE RX TWICE DAILY CBC TODAY SCHEDULE FOR UPPER ENDOSCOPY WITH DR. Olevia Perches TO CLEAR UPPER GI TRACT, IF NEGATIVE WILL DECIDE REGARDING CAPULE ENDOSCOPY  VS REPEAT COLON.. CONTINUE OMEPRAZOLE  20 MG TWICE DAILY.  Problem # 2:  PERSONAL HX COLONIC POLYPS (ICD-V12.72) Assessment: Comment Only HYPERPLASTIC 2007  Problem # 3:  DVT (ICD-453.40) Assessment: Comment Only HX 2009 /WITH PE  Problem # 4:  HYPERLIPIDEMIA (JOI-786.4) Assessment: Comment Only  Problem # 5:  HYPERTENSION (ICD-401.9) Assessment: Comment Only  Other Orders: EGD (EGD) TLB-CBC Platelet - w/Differential ([id number])  Patient Instructions: 1)  Your physician has requested that you have the following labwork done today: Go to basement level. 2)  We have scheduled the Endoscopy with Dr. [name] for 07-21-09. Directions and brochure given. 3)  [facility] Patient Information Guide given to patient. 4)  Coninue the Prilosec twice daily. 5)  Take the Iron Supplement twice  daily. 6)  Copy sent to : Madison Hickman, MD 7)  The medication list was reviewed and reconciled.  All changed / newly prescribed medications were explained.  A complete medication list was provided to the patient / caregiver.

## 2010-03-13 NOTE — Progress Notes (Signed)
Summary: refill  Medications Added METOPROLOL TARTRATE 25 MG TABS (METOPROLOL TARTRATE) one by mouth bid AMLODIPINE BESYLATE 10 MG  TABS (AMLODIPINE BESYLATE) one by mouth daily       Phone Note Refill Request Call back at Home Phone [phone] Message from:  Patient  Refills Requested: Medication #1:  AMLODIPINE BESYLATE 10 MG  TABS one by mouth daily  Medication #2:  METOPROLOL TARTRATE 25 MG TABS one by mouth bid Initial call taken by: Audie Clear,  October 18, 2009 1:51 PM  Follow-up for Phone Call        done and patient notfied Follow-up by: Madison Hickman MD,  October 18, 2009 2:35 PM    New/Updated Medications: METOPROLOL TARTRATE 25 MG TABS (METOPROLOL TARTRATE) one by mouth bid AMLODIPINE BESYLATE 10 MG  TABS (AMLODIPINE BESYLATE) one by mouth daily Prescriptions: AMLODIPINE BESYLATE 10 MG  TABS (AMLODIPINE BESYLATE) one by mouth daily  #90 x 3   Entered and Authorized by:   Madison Hickman MD   Signed by:   Madison Hickman MD on 10/18/2009   Method used:   Electronically to        [facility] (retail)       [address]       [place], [place]  [address]       Ph: [phone]       Fax: [phone]   RxID:   [id number] METOPROLOL TARTRATE 25 MG TABS (METOPROLOL TARTRATE) one by mouth bid  #180 x 3   Entered and Authorized by:   Madison Hickman MD   Signed by:   Madison Hickman MD on 10/18/2009   Method used:   Electronically to        [facility] (retail)       [address]       [place], [place]  [address]       Ph: [phone]       Fax: [phone]   RxID:   [id number]

## 2010-03-13 NOTE — Miscellaneous (Signed)
Summary: Capsule Endoscopy Scheduling Form/[facility]  Capsule Endoscopy Scheduling Form/[facility]   Imported By: Phillis Knack 08/04/2009 07:42:28  _____________________________________________________________________  External Attachment:    Type:   Image     Comment:   External Document

## 2010-03-13 NOTE — Procedures (Signed)
Summary: Upper Endoscopy  Patient: [name] Note: All result statuses are Final unless otherwise noted.  Tests: (1) Upper Endoscopy (EGD)   EGD Upper Endoscopy       DONE (C)     [facility].     [place],   [address]           ENDOSCOPY PROCEDURE REPORT           PATIENT:  Sara Russell, Sara Russell  MR#:  [id number]     BIRTHDATE:  14-Sep-1930, 30 yrs. old  GENDER:  female           ENDOSCOPIST:  Lowella Bandy. Olevia Perches, MD     Referred by:  Madison Hickman, M.D.           PROCEDURE DATE:  07/21/2009     PROCEDURE:  EGD with biopsy, EGD with dilatation over guidewire     ASA CLASS:  Class II     INDICATIONS:  anemia heme negative anemia, hx of es, stricture     dilated 8115,7262,     last colon 01/2006           MEDICATIONS:   Versed 7 mg, Fentanyl 50 mcg     TOPICAL ANESTHETIC:  Exactacain Spray           DESCRIPTION OF PROCEDURE:   After the risks benefits and     alternatives of the procedure were thoroughly explained, informed     consent was obtained.  The [facility] GIF-H180 [id number] endoscope was     introduced through the mouth and advanced to the second portion of     the duodenum, without limitations.  The instrument was slowly     withdrawn as the mucosa was fully examined.     <<PROCEDUREIMAGES>>           A stricture was found (see image1 and image2). mild nonobstructing     stricture at 30 cm, Savary dilation over a guidewire 16 mm dilator     passed wwithout difficulty  A hiatal hernia was found (see image5     and image7). 3 cm hiatal hrnia 35-38 cm,, no Cameron erosions     There were multiple polyps identified. in the fundus (see image6).     fundic gland polyps in the fundus  Otherwise the examination was     normal. With standard forceps, a biopsy was obtained and sent to     pathology (see image9, image8, image3, and image4). small bowl     biopsy to r/o villous atrophy    Retroflexed views revealed no     abnormalities.    The scope was  then withdrawn from the patient     and the procedure completed.           COMPLICATIONS:  None           ENDOSCOPIC IMPRESSION:     1) Stricture     2) Hiatal hernia     3) Polyps, multiple in the fundus     4) Otherwise normal examination     nothing to account for GI b,lood loss although the hiatal hernia     can produce low grade GIB due to [facility] erosions     RECOMMENDATIONS:     1) Await biopsy results     continue Prelosec 20 mg po bid           REPEAT EXAM:  In 0 year(s) for.  ______________________________     Lowella Bandy. Olevia Perches, MD           CC:           n.     REVISED:  07/21/2009 03:57 PM     eSIGNED:   Lowella Bandy. Brodie at 07/21/2009 03:57 PM           Antonieta Loveless, [id number]  Note: An exclamation mark (!) indicates a result that was not dispersed into the flowsheet. Document Creation Date: 07/21/2009 3:58 PM _______________________________________________________________________  (1) Order result status: Final Collection or observation date-time: 07/21/2009 15:44 Requested date-time:  Receipt date-time:  Reported date-time:  Referring Physician:   Ordering Physician: [name] [phone]) Specimen Source:  Source: Tawanna Cooler Order Number: [id number] Lab site:

## 2010-03-13 NOTE — Assessment & Plan Note (Signed)
Summary: f/u, df   Vital Signs:  Patient Profile:   77 Years Old Female Weight:      196.9 pounds Temp:     98.1 degrees F Pulse rate:   64 / minute Pulse rhythm:   regular BP sitting:   179 / 99  (left arm)  Pt. in pain?   no  Vitals Entered By: Janeth Rase LPN (April 13, 8180 9:93 PM)              Is Patient Diabetic? No     Serial Vital Signs/Assessments:  Time      Position  BP       Pulse  Resp  Temp     By                     142/88                         Madison Hickman MD   Chief Complaint:  CPE.  History of Present Illness: Chol not at goal  Throat surg by [facility]  BP up .  Pulse runs about 60 so can't go up on [facility]  Prevention needs tetanus  Med refills/med rec done  Last Flu Vaccine:  Fluvax 3+ (11/03/2007 9:31:04 AM) Flu Vaccine Result Date:  12/10/2007 Flu Vaccine Result:  given Last TD:  Done. (02/11/1998 12:00:00 AM) TD Result Date:  04/13/2008 TD Result:  given    Current Allergies: * CONTRAST DYE SULFAMETHOXAZOLE (SULFAMETHOXAZOLE)    Risk Factors:  Tobacco use:  quit    Year quit:  1963    Pack-years:  1 pk/ day    Comments:  13 year smoker    Physical Exam  General:     Well-developed,well-nourished,in no acute distress; alert,appropriate and cooperative throughout examination Head:     Normocephalic and atraumatic without obvious abnormalities. No apparent alopecia or balding. Lungs:     Normal respiratory effort, chest expands symmetrically. Lungs are clear to auscultation, no crackles or wheezes. Heart:     Normal rate and regular rhythm. S1 and S2 normal without gallop, murmur, click, rub or other extra sounds. Extremities:     No clubbing, cyanosis, edema, or deformity noted with normal full range of motion of all joints.      Impression & Recommendations:  Problem # 1:  HYPERCHOLESTEROLEMIA (ICD-272.0) Assessment: Deteriorated Change lovastatin to simvastatin.  Recheck labs in 3 months Her updated  medication list for this problem includes:    Simvastatin 40 Mg Tabs (Simvastatin) .Marland Kitchen... Take one tablet at bedtime  Orders: [facility]- Est  Level 4 ([address])  Future Orders: Direct LDL-[facility] ([id number]) ... 04/13/2009 Comp Met-[facility] ([id number]) ... 04/13/2009   Problem # 2:  HYPERTENSION, BENIGN SYSTEMIC (ICD-401.1) Close monitor, no change for now Her updated medication list for this problem includes:    Benazepril Hcl 40 Mg Tabs (Benazepril hcl) ..... One tab daily    Hydrochlorothiazide 12.5 Mg Tabs (Hydrochlorothiazide) .Marland Kitchen... Take 1 tab  by mouth every morning    Metoprolol Tartrate 50 Mg Tabs (Metoprolol tartrate) .Marland Kitchen... Take 1 tablet by mouth twice a day    Amlodipine Besylate 10 Mg Tabs (Amlodipine besylate) ..... One by mouth daily  Orders: [facility]- Est  Level 4 ([address])   Problem # 3:  UNSPECIFIED SINUSITIS (ICD-473.9) Assessment: Unchanged Recent surg by ENT.  Cough is gone and feels better Orders: [facility]- Est  Level 4 ([address])   Complete Medication List: 1)  Aspirin Childrens 81 Mg Chew (Aspirin) .... Take 1 tablet by mouth once a day 2)  Benazepril Hcl 40 Mg Tabs (Benazepril hcl) .... One tab daily 3)  Fosamax 70 Mg Tabs (Alendronate sodium) .... Take 1 tablet by mouth once a week 4)  Glucosamine Sulfate 500 Mg Tabs (Glucosamine sulfate) .... Take 1 tablet by mouth twice a day 5)  Hydrochlorothiazide 12.5 Mg Tabs (Hydrochlorothiazide) .... Take 1 tab  by mouth every morning 6)  Simvastatin 40 Mg Tabs (Simvastatin) .... Take one tablet at bedtime 7)  Metoprolol Tartrate 50 Mg Tabs (Metoprolol tartrate) .... Take 1 tablet by mouth twice a day 8)  Metrogel 1 % Gel (Metronidazole) .... Apply a small amount to affected area at bedtime. disp 60 gm 9)  Prilosec Otc 20 Mg Tbec (Omeprazole magnesium) .... Take 1 tablet by mouth once a day 10)  Sm Calcium/vitamin D 500-200 Mg-unit Tabs (Calcium carbonate-vitamin d) .... Take 2 tablet by mouth once a day 11)  Amlodipine Besylate 10 Mg Tabs  (Amlodipine besylate) .... One by mouth daily  Other Orders: Tdap => 81yr IM (([address] Admin 1st Vaccine (([id number]    Prescriptions: METOPROLOL TARTRATE 50 MG TABS (METOPROLOL TARTRATE) Take 1 tablet by mouth twice a day  #90 x 3   Entered and Authorized by:   WMadison HickmanMD   Signed by:   WMadison HickmanMD on 04/13/2008   Method used:   Historical   RxID::   [id number] BESYLATE 10 MG  TABS (AMLODIPINE BESYLATE) one by mouth daily  #90 x 3   Entered and Authorized by:   WMadison HickmanMD   Signed by:   WMadison HickmanMD on 04/13/2008   Method used:   Historical   RxID:   [id number] 40 MG TABS (SIMVASTATIN) Take one tablet at bedtime  #90 x 3   Entered and Authorized by:   WMadison HickmanMD   Signed by:   WMadison HickmanMD on 04/13/2008   Method used:   Historical   RxID::   [id number] 12.5 MG  TABS (HYDROCHLOROTHIAZIDE) Take 1 tab  by mouth every morning  #90 x 3   Entered and Authorized by:   WMadison HickmanMD   Signed by:   WMadison HickmanMD on 04/13/2008   Method used:   Historical   RxID:   [id number] HCL 40 MG  TABS (BENAZEPRIL HCL) one tab daily  #90 x 3   Entered and Authorized by:   WMadison HickmanMD   Signed by:   WMadison HickmanMD on 04/13/2008   Method used:   Historical   RxID:   [id number]   Tetanus/Td Vaccine    Vaccine Type: Tdap    Site: left deltoid    Mfr: GlaxoSmithKline    Dose: 0.5 ml    Route: IM    Given by: AGeanie CooleyRN    Exp. Date: 04/06/2010    Lot #: [id number]   VIS given: 12/30/06 version given April 13, 2008.

## 2010-03-13 NOTE — Procedures (Signed)
Summary: Upper Endoscopy  Patient: Sara Russell Note: All result statuses are Final unless otherwise noted.  Tests: (1) Upper Endoscopy (EGD)   EGD Upper Endoscopy       DONE (C)     [facility].     [place], [place]  [address]           ENDOSCOPY PROCEDURE REPORT           PATIENT:  Sara, Russell  MR#:  [id number]     BIRTHDATE:  11-04-1930, 21 yrs. old  GENDER:  female           ENDOSCOPIST:  Lowella Bandy. Olevia Perches, MD     Referred by:  Madison Hickman, M.D.           PROCEDURE DATE:  08/22/2009     PROCEDURE:  enteroscopy [address]     ASA CLASS:  Class II     INDICATIONS:  iron deficiency anemia abnormal SBCE suggestive of a     ?"bulge" in the jejunum 40 min distal from the duodenum, Hgb 8.9,     MCV 63,neg, colonoscopy     CT enterograpgy was normal           MEDICATIONS:   Versed 7 mg, Fentanyl 87.5 mcg     TOPICAL ANESTHETIC:  Exactacain Spray           DESCRIPTION OF PROCEDURE:   After the risks benefits and     alternatives of the procedure were thoroughly explained, informed     consent was obtained.  The [facility] GIF-H180 [id number] endoscope was     introduced through the mouth and advanced to the proximal jejunum,     without limitations.  The instrument was slowly withdrawn as the     mucosa was fully examined.     <<PROCEDUREIMAGES>>           A hiatal hernia was found (see image7 and image8). 3 cm     nonreducible hiatal hernia  There were multiple polyps identified.     multiple fundic gland polyps With standard forceps, a biopsy was     obtained and sent to pathology (see image6 and image7). small bowl     biopsy  Otherwise the examination was normal (see image1, image2,     image3, image4, and image5).    Retroflexed views revealed no     abnormalities.    The scope was then withdrawn from the patient     and the procedure completed.           COMPLICATIONS:  None           ENDOSCOPIC IMPRESSION:     1) Hiatal hernia     2) Polyps,  multiple     3) Otherwise normal examination     RECOMMENDATIONS:     hematology consultation           REPEAT EXAM:  In 0 year(s) for.           ______________________________     Lowella Bandy. Olevia Perches, MD           CC:           n.     REVISED:  09/19/2009 01:18 PM     eSIGNED:   Lowella Bandy. Brodie at 09/19/2009 01:18 PM           Antonieta Loveless, [id number]  Note: An exclamation mark (!) indicates a result that was  not dispersed into the flowsheet. Document Creation Date: 09/19/2009 1:19 PM _______________________________________________________________________  (1) Order result status: Final Collection or observation date-time: 08/22/2009 16:07 Requested date-time:  Receipt date-time:  Reported date-time:  Referring Physician:   Ordering Physician: [name] [id number]) Specimen Source:  Source: Tawanna Cooler Order Number: [id number] Lab site:

## 2010-03-13 NOTE — Progress Notes (Signed)
Summary: FYI   Phone Note Call from Patient   Caller: Patient Summary of Call: saw Dr Kalman Shan today and did a C125 blood test Initial call taken by: Audie Clear,  August 17, 2009 11:03 AM  Follow-up for Phone Call        [place].   Follow-up by: Madison Hickman MD,  August 17, 2009 11:16 AM

## 2010-03-13 NOTE — Progress Notes (Signed)
   Phone Note Other Incoming Call back at [phone]   Caller: Lab - for Critical Lab Value Summary of Call: called for critical lab value of hemaglobin 6.0.   while the patient doesn't have a history of low HgB per old chart review at patient visit she was not tachycardic or hypotensive suggesting that this blood loss was NOT acute.  For this reason I have elected not to call her to come to the ER in the middle of the night and instead let her rest.  However she will likely need admission in the AM for blood transfusion and workup.  Will send flag to blue team, Dr Andria Frames to arrange this in the AM Initial call taken by: Patria Mane  MD,  Jul 06, 2009 2:21 AM  Follow-up for Phone Call        Called pt and informed her of above, told her that per Dr. Tye Savoy, she is to report to the ED for repeat labs and most likely admission. Patient expressed understanding. Follow-up by: Levert Feinstein LPN,  Jul 07, 3147 70:26 AM

## 2010-03-13 NOTE — Procedures (Signed)
Summary: Capsule form/[facility]  Capsule form/[facility]   Imported By: Bubba Hales 07/26/2009 08:32:48  _____________________________________________________________________  External Attachment:    Type:   Image     Comment:   External Document

## 2010-03-13 NOTE — Letter (Signed)
Summary: Patient [name] Biopsy Results  [facility]  [place], [name] [address]   Phone: [phone]  Fax: [phone]        July 25, 2009 MRN: [id number]    Sara Russell [address] [place], [place]  [address]    Dear [name],  I am pleased to inform you that the biopsies taken during your recent endoscopic examination did not show any evidence of cancer upon pathologic examination.The small bowl biopsy  shows normal villous pattern, no evidence of sprue.  Additional information/recommendations:  __No further action is needed at this time.  Please follow-up with      your primary care physician for your other healthcare needs.  __ Please call [phone] to schedule a return visit to review      your condition.  __We will notify You of the results of Your Capsule endoscopy  _   Please call us if you are having persistent problems or have questions about your condition that have not been fully answered at this time.  Sincerely,  Lafayette Dragon MD  This letter has been electronically signed by your physician.  Appended Document: Patient Notice-Endo Biopsy Results letter mailed 6.16.11

## 2010-03-13 NOTE — Progress Notes (Signed)
Summary: Hematology Consult   Phone Note Outgoing Call Call back at [facility] Phone [phone]   Call placed by: Vivia Ewing LPN,  August 24, 8248 03:70 AM Call placed to: Patient Summary of Call: Per Dr.Andres Vest/Endo. report from 08-22-09, pt. needs a Hematology consult. Pt. is aware her records have been faxed to the [facility], I will call her when I get the appointment information. Pt. instructed to call back as needed.  Initial call taken by: Vivia Ewing LPN,  August 25, 4886 91:69 AM  Follow-up for Phone Call        Pt. has an appt. w/Dr.Shadad in Hematology, on 08-31-09. She is concerned because Dr.Hensel tells her she doesn't need to see Hematology.  Pt. states she really needs to discuss w/Dr.Luvenia Cranford.  DR.Jared Cahn PLEASE CALL PT. AT [phone].  Follow-up by: Vivia Ewing LPN,  August 25, 2798 3:49 PM  Additional Follow-up for Phone Call Additional follow up Details #1::        I have sent a flag to Dr.Hensel. Additional Follow-up by: Lafayette Dragon MD,  August 24, 2009 4:47 PM    Additional Follow-up for Phone Call Additional follow up Details #2::    Called patient and told does not need hematologist now.  She had anemia studies in the hospital and was confirmed to have iron deficiency.  I plan repeat CBC and iron studies in one month.  She will FU with me on this issue.  Please cancel heme appointment. Follow-up by: Madison Hickman MD,  August 25, 2009 8:58 AM   Appended Document: Hematology Consult I have cancelled the appt. with Dr.Shadad. Pt. is aware and she will call us as needed.

## 2010-03-13 NOTE — Consult Note (Signed)
Summary: [facility]  [facility]   Imported By: Samara Snide 01/22/2008 15:15:38  _____________________________________________________________________  External Attachment:    Type:   Image     Comment:   External Document

## 2010-03-15 NOTE — Progress Notes (Signed)
Summary: Referral   Phone Note Call from Patient Call back at Home Phone [phone]   Summary of Call: pt wanted to let MD know she has had 2 nose bleeds in the last month. pt also wants to go ahead and set up appt for an ultrasound. see last notes.  Initial call taken by: Samara Snide,  February 15, 2010 9:28 AM  Follow-up for Phone Call        ultrasound order. Follow-up by: Madison Hickman MD,  February 15, 2010 12:09 PM  Additional Follow-up for Phone Call Additional follow up Details #1::        called and informed pt of appt. The info is on the order in chart Additional Follow-up by: Enid Skeens, [place],  February 15, 2010 4:10 PM

## 2010-04-16 ENCOUNTER — Ambulatory Visit
Admission: RE | Admit: 2010-04-16 | Discharge: 2010-04-16 | Disposition: A | Discharge status: Home / Self Care | Payer: Medicare Other | Source: Ambulatory Visit | Attending: Family Medicine | Admitting: Family Medicine

## 2010-04-16 DIAGNOSIS — Z1239 Encounter for other screening for malignant neoplasm of breast: Secondary | ICD-10-CM

## 2010-04-24 ENCOUNTER — Encounter: Payer: Self-pay | Admitting: Home Health Services

## 2010-04-29 ENCOUNTER — Encounter: Payer: Self-pay | Admitting: Family Medicine

## 2010-04-29 ENCOUNTER — Emergency Department (HOSPITAL_COMMUNITY)
Admission: EM | Admit: 2010-04-29 | Discharge: 2010-04-29 | Disposition: A | Discharge status: Home / Self Care | Payer: Medicare Other | Attending: Emergency Medicine | Admitting: Emergency Medicine

## 2010-04-29 DIAGNOSIS — F4321 Adjustment disorder with depressed mood: Secondary | ICD-10-CM

## 2010-04-29 DIAGNOSIS — E876 Hypokalemia: Secondary | ICD-10-CM | POA: Insufficient documentation

## 2010-04-29 DIAGNOSIS — R0989 Other specified symptoms and signs involving the circulatory and respiratory systems: Secondary | ICD-10-CM | POA: Insufficient documentation

## 2010-04-29 DIAGNOSIS — R197 Diarrhea, unspecified: Secondary | ICD-10-CM | POA: Insufficient documentation

## 2010-04-29 DIAGNOSIS — K219 Gastro-esophageal reflux disease without esophagitis: Secondary | ICD-10-CM | POA: Insufficient documentation

## 2010-04-29 DIAGNOSIS — R42 Dizziness and giddiness: Secondary | ICD-10-CM | POA: Insufficient documentation

## 2010-04-29 DIAGNOSIS — R0609 Other forms of dyspnea: Secondary | ICD-10-CM | POA: Insufficient documentation

## 2010-04-29 DIAGNOSIS — R5383 Other fatigue: Secondary | ICD-10-CM

## 2010-04-29 DIAGNOSIS — E039 Hypothyroidism, unspecified: Secondary | ICD-10-CM | POA: Insufficient documentation

## 2010-04-29 DIAGNOSIS — I10 Essential (primary) hypertension: Secondary | ICD-10-CM | POA: Insufficient documentation

## 2010-04-29 DIAGNOSIS — E871 Hypo-osmolality and hyponatremia: Secondary | ICD-10-CM

## 2010-04-29 DIAGNOSIS — R195 Other fecal abnormalities: Secondary | ICD-10-CM | POA: Insufficient documentation

## 2010-04-29 LAB — POCT CARDIAC MARKERS
CKMB, poc: 1.5 ng/mL (ref 1.0–8.0)
Myoglobin, poc: 101 ng/mL (ref 12–200)
Troponin i, poc: <0.05 ng/mL (ref 0.00–0.09)

## 2010-04-29 LAB — CBC
HCT: 35.5 % — ABNORMAL LOW (ref 36.0–46.0)
Hemoglobin: 12.7 g/dL (ref 12.0–15.0)
MCH: 31.5 pg (ref 26.0–34.0)
MCHC: 35.8 g/dL (ref 30.0–36.0)
MCV: 88.1 fL (ref 78.0–100.0)
Platelets: 273 10*3/uL (ref 150–400)
RBC: 4.03 MIL/uL (ref 3.87–5.11)
RDW: 12.2 % (ref 11.5–15.5)
WBC: 8.1 10*3/uL (ref 4.0–10.5)

## 2010-04-29 LAB — BASIC METABOLIC PANEL
BUN: 18 mg/dL (ref 6–23)
CO2: 24 mEq/L (ref 19–32)
Calcium: 9.1 mg/dL (ref 8.4–10.5)
Chloride: 94 mEq/L — ABNORMAL LOW (ref 96–112)
Creatinine, Ser: 1.28 mg/dL — ABNORMAL HIGH (ref 0.4–1.2)
GFR calc Af Amer: 49 mL/min — ABNORMAL LOW (ref >60)
GFR calc non Af Amer: 40 mL/min — ABNORMAL LOW (ref >60)
Glucose, Bld: 99 mg/dL (ref 70–99)
Potassium: 3 mEq/L — ABNORMAL LOW (ref 3.5–5.1)
Sodium: 128 mEq/L — ABNORMAL LOW (ref 135–145)

## 2010-04-29 LAB — TYPE AND SCREEN
ABO/RH(D): A NEG
Antibody Screen: NEGATIVE

## 2010-04-29 LAB — OCCULT BLOOD, POC DEVICE: Fecal Occult Bld: NEGATIVE

## 2010-04-29 NOTE — Assessment & Plan Note (Signed)
Since creatinine mildly elevated.  Will hold benazepril in the setting of diarrhea.  Will have pt hold this medication until f/up appointment with md tomorrow.

## 2010-04-29 NOTE — Progress Notes (Signed)
  Subjective:    Patient ID: Sara Russell, female    DOB: 03/25/1930, 77 y.o.   MRN: [id number]  HPI Pt is a 77 y.o. Female who came to the ER for eval of diarrhea x 3 days and weakness/fatigue.  Pt states [place] the beach [place].  On her return home on Friday (3 days ago) she felt very weak and fatigued.  No improvement on Saturday.  On Sunday (today) felt lightheaded and was concerned that her hgb had dropped.  She was concerned about this b/c she has a past medical history of GI bleed.  Also has had diarrhea over the weekend that increased in frequency to 15-20 episodes today prior to arrival to ER.  On arrival to ER her Hbg was found to be 12.7.  ( here last hgb 2 weeks ago was 11.8.)  Pt was evaluated and found to have a Na of 128 and a K of 3.0.  Pt POC cardiac enzymes were WNL.  No changes on EKG- NSR with no st changes.  Creatinine 1.28 (pt cr range of 0.88-1.22).  Fecal occult blood was negative.  ER doctor requested that I come down to see pt and decide if further workup should be done inpatient or outpatient.    Review of Systems    no fever. No cp. No n/v. No SOB at rest.  One episode of SOB on exertion when going up stairs with suitcase last week. No blurry vision. No syncope.  No swelling or edema.  No abd pain.  No bright red stools.  + dark stools (pt states per her normal b/c she is on iron supplements).  No palpitations.   Objective:   Physical Exam  Constitutional: She is oriented to person, place, and time. She appears well-developed and well-nourished.  HENT:  Head: Normocephalic.  Right Ear: External ear normal.  Left Ear: External ear normal.  Nose: Nose normal.  Mouth/Throat: Oropharynx is clear and moist. No oropharyngeal exudate.  Eyes: Conjunctivae and EOM are normal. Pupils are equal, round, and reactive to light. Right eye exhibits no discharge. Left eye exhibits no discharge.  Neck: Normal range of motion.  Cardiovascular: Normal rate and regular  rhythm.  Exam reveals no gallop and no friction rub.   No murmur heard. Pulmonary/Chest: Effort normal and breath sounds normal. No respiratory distress. She has no wheezes. She exhibits no tenderness.  Abdominal: Soft. Bowel sounds are normal. She exhibits no distension. There is no tenderness. There is no rebound and no guarding.       Diffuse mild tenderness in lower abd on palpation.    Musculoskeletal: Normal range of motion.       Strength 5/5 equal and symmetric in upper and lower extremities.  Lymphadenopathy:    She has no cervical adenopathy.  Neurological: She is alert and oriented to person, place, and time.  Skin: Skin is warm and dry.  Psychiatric: She has a normal mood and affect. Her behavior is normal. Judgment and thought content normal.       Pt tearful when talking about husband who passed away last fall.          Assessment & Plan:

## 2010-04-29 NOTE — Assessment & Plan Note (Signed)
Pt tearful during exam when she mentioned recent loss of her husband.  Talked with pt about the importance of reaching out to Dr. Andria Frames if she feels she needs help with coping.  Pt states that she is doing fine at this point.  I think that grief counseling offered through hospice would be beneficial for this patient as well as medical treatment with SSRI. Pt not open to these things at this time.  Will have pt pcp follow up on this sensitive subject.

## 2010-04-29 NOTE — Assessment & Plan Note (Signed)
Most likely due to GI losses.  Pt received IV rehydration in ER with NS.  Pt Na usually is slightly low (see Na trend under lab section.)  Was lower than normal today at 128.  Will recheck NA at follow up appointment to ensure back to pt baseline.

## 2010-04-29 NOTE — Assessment & Plan Note (Signed)
Cause of fatigue most likely 2/2 diarrhea and fluid losses.  Fatigue may be worsened by baseline grief and depression since loss of husband last fall.

## 2010-04-29 NOTE — Assessment & Plan Note (Signed)
Improved after arrival to ER.  Most likely 2/2 viral etiology.  Will follow pt in outpt clinic tomorrow to ensure that this has continued to resolve.  Pt encouraged to continue po fluids.

## 2010-04-29 NOTE — Assessment & Plan Note (Signed)
Most likely due to GI losses from diarrhea. Pt given KCL 77mq in ER.  Pt to take a 2nd dose of KCL at home tonight of 455m.  Although not on med list, pt states that she has KCL at home that she takes as needed for leg cramping.  Pt to get work in appointment tomorrow.  Provider may want to recheck K tomorrow or later in the week to make sure that it is wnl.

## 2010-04-29 NOTE — Patient Instructions (Signed)
Pt instructed to hold benazepril x 24 hours until her f/up with MD at clinic.  Pt is to take KCL 42mq at bedtime tonight.  Also pt is to continue drinking lots of fluids.  Pt is to call clinic at 8:30 to get work in appointment.

## 2010-04-30 ENCOUNTER — Encounter: Payer: Self-pay | Admitting: Family Medicine

## 2010-04-30 ENCOUNTER — Ambulatory Visit: Payer: Self-pay | Admitting: Home Health Services

## 2010-04-30 ENCOUNTER — Ambulatory Visit (INDEPENDENT_AMBULATORY_CARE_PROVIDER_SITE_OTHER): Payer: Medicare Other | Admitting: Family Medicine

## 2010-04-30 VITALS — BP 90/58 | HR 100 | Temp 98.0°F | Wt 178.5 lb

## 2010-04-30 DIAGNOSIS — E876 Hypokalemia: Secondary | ICD-10-CM

## 2010-04-30 DIAGNOSIS — N179 Acute kidney failure, unspecified: Secondary | ICD-10-CM

## 2010-04-30 DIAGNOSIS — R197 Diarrhea, unspecified: Secondary | ICD-10-CM

## 2010-04-30 DIAGNOSIS — I959 Hypotension, unspecified: Secondary | ICD-10-CM | POA: Insufficient documentation

## 2010-04-30 LAB — CBC
HCT: 18.5 % — ABNORMAL LOW (ref 36.0–46.0)
HCT: 26.2 % — ABNORMAL LOW (ref 36.0–46.0)
HCT: 26.3 % — ABNORMAL LOW (ref 36.0–46.0)
Hemoglobin: 5.8 g/dL — CL (ref 12.0–15.0)
Hemoglobin: 8.4 g/dL — ABNORMAL LOW (ref 12.0–15.0)
Hemoglobin: 8.6 g/dL — ABNORMAL LOW (ref 12.0–15.0)
MCHC: 31.1 g/dL (ref 30.0–36.0)
MCHC: 32.1 g/dL (ref 30.0–36.0)
MCHC: 32.6 g/dL (ref 30.0–36.0)
MCV: 60 fL — ABNORMAL LOW (ref 78.0–100.0)
MCV: 67.2 fL — ABNORMAL LOW (ref 78.0–100.0)
MCV: 67.9 fL — ABNORMAL LOW (ref 78.0–100.0)
Platelets: 309 10*3/uL (ref 150–400)
Platelets: 309 10*3/uL (ref 150–400)
Platelets: 332 10*3/uL (ref 150–400)
RBC: 3.09 MIL/uL — ABNORMAL LOW (ref 3.87–5.11)
RBC: 3.86 MIL/uL — ABNORMAL LOW (ref 3.87–5.11)
RBC: 3.92 MIL/uL (ref 3.87–5.11)
RDW: 17.4 % — ABNORMAL HIGH (ref 11.5–15.5)
RDW: 26.4 % — ABNORMAL HIGH (ref 11.5–15.5)
RDW: 27.2 % — ABNORMAL HIGH (ref 11.5–15.5)
WBC: 5.2 10*3/uL (ref 4.0–10.5)
WBC: 6.1 10*3/uL (ref 4.0–10.5)
WBC: 6.6 10*3/uL (ref 4.0–10.5)

## 2010-04-30 LAB — RETICULOCYTES
RBC.: 4.01 MIL/uL (ref 3.87–5.11)
RBC.: 4.03 MIL/uL (ref 3.87–5.11)
Retic Count, Absolute: 40.1 10*3/uL (ref 19.0–186.0)
Retic Count, Absolute: 40.3 10*3/uL (ref 19.0–186.0)
Retic Ct Pct: 1 % (ref 0.4–3.1)
Retic Ct Pct: 1 % (ref 0.4–3.1)

## 2010-04-30 LAB — BASIC METABOLIC PANEL
BUN: 11 mg/dL (ref 6–23)
BUN: 17 mg/dL (ref 6–23)
BUN: 17 mg/dL (ref 6–23)
CO2: 23 mEq/L (ref 19–32)
CO2: 25 mEq/L (ref 19–32)
CO2: 21 mEq/L (ref 19–32)
Calcium: 8.8 mg/dL (ref 8.4–10.5)
Calcium: 8.9 mg/dL (ref 8.4–10.5)
Calcium: 9.2 mg/dL (ref 8.4–10.5)
Chloride: 102 mEq/L (ref 96–112)
Chloride: 98 mEq/L (ref 96–112)
Chloride: 99 mEq/L (ref 96–112)
Creat: 1.15 mg/dL (ref 0.40–1.20)
Creatinine, Ser: 0.96 mg/dL (ref 0.4–1.2)
Creatinine, Ser: 1.22 mg/dL — ABNORMAL HIGH (ref 0.4–1.2)
GFR calc Af Amer: 52 mL/min — ABNORMAL LOW (ref >60)
GFR calc Af Amer: >60 mL/min (ref >60)
GFR calc non Af Amer: 43 mL/min — ABNORMAL LOW (ref >60)
GFR calc non Af Amer: 57 mL/min — ABNORMAL LOW (ref >60)
Glucose, Bld: 106 mg/dL — ABNORMAL HIGH (ref 70–99)
Glucose, Bld: 111 mg/dL — ABNORMAL HIGH (ref 70–99)
Glucose, Bld: 122 mg/dL — ABNORMAL HIGH (ref 70–99)
Potassium: 3.8 mEq/L (ref 3.5–5.1)
Potassium: 4.1 mEq/L (ref 3.5–5.1)
Potassium: 3.9 mEq/L (ref 3.5–5.3)
Sodium: 129 mEq/L — ABNORMAL LOW (ref 135–145)
Sodium: 134 mEq/L — ABNORMAL LOW (ref 135–145)
Sodium: 133 mEq/L — ABNORMAL LOW (ref 135–145)

## 2010-04-30 LAB — HEPATIC FUNCTION PANEL
ALT: 16 U/L (ref 0–35)
AST: 26 U/L (ref 0–37)
Albumin: 3.8 g/dL (ref 3.5–5.2)
Alkaline Phosphatase: 44 U/L (ref 39–117)
Bilirubin, Direct: 0.1 mg/dL (ref 0.0–0.3)
Indirect Bilirubin: 0.3 mg/dL (ref 0.3–0.9)
Total Bilirubin: 0.4 mg/dL (ref 0.3–1.2)
Total Protein: 6.6 g/dL (ref 6.0–8.3)

## 2010-04-30 LAB — URINALYSIS, MICROSCOPIC ONLY
Bilirubin Urine: NEGATIVE
Glucose, UA: NEGATIVE mg/dL
Hgb urine dipstick: NEGATIVE
Ketones, ur: NEGATIVE mg/dL
Nitrite: NEGATIVE
Protein, ur: NEGATIVE mg/dL
Specific Gravity, Urine: 1.007 (ref 1.005–1.030)
Urobilinogen, UA: 0.2 mg/dL (ref 0.0–1.0)
pH: 7 (ref 5.0–8.0)

## 2010-04-30 LAB — FERRITIN: Ferritin: 6 ng/mL — ABNORMAL LOW (ref 10–291)

## 2010-04-30 LAB — ABO/RH: ABO/RH(D): A NEG

## 2010-04-30 LAB — PROTIME-INR
INR: 1.01 (ref 0.00–1.49)
Prothrombin Time: 13.2 seconds (ref 11.6–15.2)

## 2010-04-30 LAB — DIFFERENTIAL
Basophils Absolute: 0.1 10*3/uL (ref 0.0–0.1)
Basophils Relative: 2 % — ABNORMAL HIGH (ref 0–1)
Eosinophils Absolute: 0.2 10*3/uL (ref 0.0–0.7)
Eosinophils Relative: 3 % (ref 0–5)
Lymphocytes Relative: 23 % (ref 12–46)
Lymphs Abs: 1.2 10*3/uL (ref 0.7–4.0)
Monocytes Absolute: 0.6 10*3/uL (ref 0.1–1.0)
Monocytes Relative: 12 % (ref 3–12)
Neutro Abs: 3.1 10*3/uL (ref 1.7–7.7)
Neutrophils Relative %: 60 % (ref 43–77)

## 2010-04-30 LAB — CROSSMATCH
ABO/RH(D): A NEG
Antibody Screen: NEGATIVE

## 2010-04-30 LAB — IRON AND TIBC
Iron: 456 ug/dL — ABNORMAL HIGH (ref 42–135)
UIBC: <55 ug/dL

## 2010-04-30 LAB — CBC WITH DIFFERENTIAL/PLATELET
Basophils Absolute: 0 10*3/uL (ref 0.0–0.1)
Basophils Relative: 0 % (ref 0–1)
Eosinophils Absolute: 0 10*3/uL (ref 0.0–0.7)
Eosinophils Relative: 0 % (ref 0–5)
HCT: 36.6 % (ref 36.0–46.0)
Hemoglobin: 12.1 g/dL (ref 12.0–15.0)
Lymphocytes Relative: 7 % — ABNORMAL LOW (ref 12–46)
Lymphs Abs: 0.7 10*3/uL (ref 0.7–4.0)
MCH: 30.9 pg (ref 26.0–34.0)
MCHC: 33.1 g/dL (ref 30.0–36.0)
MCV: 93.4 fL (ref 78.0–100.0)
Monocytes Absolute: 0.7 10*3/uL (ref 0.1–1.0)
Monocytes Relative: 7 % (ref 3–12)
Neutro Abs: 8.7 10*3/uL — ABNORMAL HIGH (ref 1.7–7.7)
Neutrophils Relative %: 86 % — ABNORMAL HIGH (ref 43–77)
Platelets: 332 10*3/uL (ref 150–400)
RBC: 3.92 MIL/uL (ref 3.87–5.11)
RDW: 12.7 % (ref 11.5–15.5)
WBC: 10.1 10*3/uL (ref 4.0–10.5)

## 2010-04-30 LAB — FOLATE: Folate: >20 ng/mL

## 2010-04-30 LAB — AMMONIA: Ammonia: 17 umol/L (ref 11–35)

## 2010-04-30 LAB — VITAMIN B12: Vitamin B-12: 563 pg/mL (ref 211–911)

## 2010-04-30 LAB — HEMOCCULT GUIAC POC 1CARD (OFFICE): Fecal Occult Bld: NEGATIVE

## 2010-04-30 LAB — HAPTOGLOBIN: Haptoglobin: 139 mg/dL (ref 16–200)

## 2010-04-30 MED ORDER — DICYCLOMINE HCL 20 MG PO TABS: 20.0000 mg | ORAL_TABLET | Freq: Three times a day (TID) | ORAL | Status: DC | PRN

## 2010-04-30 MED ORDER — SODIUM CHLORIDE 0.9 % IV SOLN
Freq: Once | INTRAVENOUS | Status: AC
  Administered 2010-04-30: 1000 mL via INTRAVENOUS

## 2010-04-30 NOTE — Patient Instructions (Signed)
Please make a f/u appointment in 1 week.  We will need to check your Blood pressure, electrolytes, and kidney function. We will also need to monitor your diarrhea. If you are having a difficult time keeping up with your fluids or if you start to have lightheadedness again, please come back to [facility]. We would like to try Bentyl two-three times per day with meals.  This may help with diarrhea.

## 2010-04-30 NOTE — Progress Notes (Signed)
Addended by: Marcell Barlow on: 04/30/2010 05:41 PM   Modules accepted: Orders

## 2010-04-30 NOTE — Assessment & Plan Note (Signed)
Cr was 1.28 with baseline Cr ~0.9.  Likely prerenal due to dehydration from diarrhea.  She received 1L bolus in ED yesterday.  She continues to have diarrhea today.  We have given her 1 L NS today .  Will check Cr today and recheck in 1 wk.  Pt to hold Benazepril.

## 2010-04-30 NOTE — Progress Notes (Addendum)
BP 106/74 after 500 ml normal saline infused. BP 110 /72 after 1000 ml infused. IV d/c at 12:30 PM. IV catheter intact.

## 2010-04-30 NOTE — Progress Notes (Signed)
  Subjective:    Patient ID: Sara Russell, female    DOB: 12-30-1930, 77 y.o.   MRN: [id number]  HPI  Diarrhea: Patient complains of diarrhea. Onset of diarrhea was about 10 days ago.  Pt was at [facility] when this started.  . Diarrhea is occurring approximately for the last 2-3 days she has had about 20-30 episodes.  Black watery stools.  Prior to that Patient describes diarrhea as Soft runny black stools. She denies abd pain, vomiting, weight loss, fever/chills, sick contacts, recent antibiotic therapy, suspicious food/drink.  She has tried loperamide for three to four times per day for 5-6 days without any improvement.  She does endorse dark stools, but has been on iron for anemia for several months so her stools is always dark.   She also takes stool softener with her iron supplement, but stopped taking it as soon as she started having loose stools.  *Note hemoccult from ED yesterday was negative     Review of Systems Per hpi     Objective:   Physical Exam Orthostatics: 110/64 lying, 90/58 sitting, 90/58 standing General:  NAD, AOx3, cooperative and appropriate throughout exam Mouth: dry mucous membrane.  Neck:  no jugular venous distention. Lungs:  lungs are clear.  Respiratory effort is not labored. Heart:  cardiac exam reveals an S1-S2.  The rhythm is regular. Abdomen:  abdomen is soft. +BS. No masses.  No inguinal nodes.  Extremities:  no peripheral edema.      Assessment & Plan:

## 2010-04-30 NOTE — Assessment & Plan Note (Signed)
Recheck  BP showed 110/64, and pt was initially 130s.  Hypotension is likely from dehydration secondary to diarrhea.  We have given her 500cc NS bolus with resulting BP 106/67.  Will give pt another 500cc.  I have advised pt to hold all her antihypertensive meds until her BPs are consistently >140s/80s-90s.  Pt will check BP daily and resume meds in this order: BB first, then CCB.  Pt to hold HCTZ as she is hypokalemic in ED yesterday.  Pt to hold Benazepril as she was in [facility] yesterday.  We will check Bmet today.  Pt to rtc in 1 wk for f/u.

## 2010-04-30 NOTE — Assessment & Plan Note (Addendum)
Diarrhea x 10 days, with worsening number of episodes in the last 2 days.  Her WBC was normal in ED yesterday (8.1) so likely not C dif.  She has not had recent exposure to antibiotics.  Still, I will check stool cultures and C dif culture today.  Her abd exam is wnl and she is afebrile, has no vomiting or abd pain.  I am unsure of the etiology of diarrhea, but collagenous colitis is a ddx.  Will treat supportively and monitor fluid status.  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  Still, I advised her to call [facility]/go to ER if she feels lightheaded and may need IVF.  Pt has tried loperamide for diarrhea, and it did not help.  I will add Bentyl, an antispasmotic agent, to see if it may reduce episodes of diarrhea.  Pt to rtc in 1 wk for f/u.

## 2010-04-30 NOTE — Assessment & Plan Note (Addendum)
Likely from diarrhea x 10 days.  Yesterday in ED K was 3.0 and pt was given one dose of KCl 85mQ.  Will check electrolytes today as she continues to have diarrhea.  Will call pt if it is still low and I will send Rx to her pharmacy.  Pt will hold HCTZ for now.

## 2010-05-01 LAB — CLOSTRIDIUM DIFFICILE EIA: CDIFTX: NEGATIVE

## 2010-05-01 LAB — FECAL LACTOFERRIN, QUANT: Lactoferrin: POSITIVE

## 2010-05-01 LAB — GIARDIA/CRYPTOSPORIDIUM (EIA)
Cryptosporidium Screen (EIA): NEGATIVE
Giardia Screen (EIA): NEGATIVE

## 2010-05-04 LAB — STOOL CULTURE

## 2010-05-09 ENCOUNTER — Ambulatory Visit (INDEPENDENT_AMBULATORY_CARE_PROVIDER_SITE_OTHER): Payer: Medicare Other | Admitting: Family Medicine

## 2010-05-09 ENCOUNTER — Other Ambulatory Visit: Payer: Self-pay | Admitting: Family Medicine

## 2010-05-09 ENCOUNTER — Encounter: Payer: Self-pay | Admitting: Family Medicine

## 2010-05-09 DIAGNOSIS — E7800 Pure hypercholesterolemia, unspecified: Secondary | ICD-10-CM

## 2010-05-09 DIAGNOSIS — R197 Diarrhea, unspecified: Secondary | ICD-10-CM

## 2010-05-09 DIAGNOSIS — I10 Essential (primary) hypertension: Secondary | ICD-10-CM

## 2010-05-09 LAB — CBC
HCT: 37.6 % (ref 36.0–46.0)
Hemoglobin: 12.6 g/dL (ref 12.0–15.0)
MCH: 30.4 pg (ref 26.0–34.0)
MCHC: 33.5 g/dL (ref 30.0–36.0)
MCV: 90.8 fL (ref 78.0–100.0)
Platelets: 308 10*3/uL (ref 150–400)
RBC: 4.14 MIL/uL (ref 3.87–5.11)
RDW: 12.9 % (ref 11.5–15.5)
WBC: 7.3 10*3/uL (ref 4.0–10.5)

## 2010-05-09 LAB — LIPID PANEL
Cholesterol: 171 mg/dL (ref 0–200)
HDL: 74 mg/dL (ref >39)
LDL Cholesterol: 70 mg/dL (ref 0–99)
Total CHOL/HDL Ratio: 2.3 Ratio
Triglycerides: 137 mg/dL (ref <150)
VLDL: 27 mg/dL (ref 0–40)

## 2010-05-09 LAB — COMPREHENSIVE METABOLIC PANEL
ALT: 14 U/L (ref 0–35)
AST: 25 U/L (ref 0–37)
Albumin: 4.2 g/dL (ref 3.5–5.2)
Alkaline Phosphatase: 57 U/L (ref 39–117)
BUN: 10 mg/dL (ref 6–23)
CO2: 23 mEq/L (ref 19–32)
Calcium: 9.3 mg/dL (ref 8.4–10.5)
Chloride: 99 mEq/L (ref 96–112)
Creat: 0.92 mg/dL (ref 0.40–1.20)
Glucose, Bld: 96 mg/dL (ref 70–99)
Potassium: 3.8 mEq/L (ref 3.5–5.3)
Sodium: 137 mEq/L (ref 135–145)
Total Bilirubin: 0.4 mg/dL (ref 0.3–1.2)
Total Protein: 6.9 g/dL (ref 6.0–8.3)

## 2010-05-09 LAB — MAGNESIUM: Magnesium: 1.7 mg/dL (ref 1.5–2.5)

## 2010-05-09 MED ORDER — OMEPRAZOLE 40 MG PO CPDR: 40.0000 mg | DELAYED_RELEASE_CAPSULE | Freq: Every day | ORAL | Status: DC

## 2010-05-09 NOTE — Telephone Encounter (Signed)
Refilled via fax request.

## 2010-05-10 ENCOUNTER — Encounter: Payer: Self-pay | Admitting: Family Medicine

## 2010-05-10 NOTE — Progress Notes (Signed)
  Subjective:    Patient ID: Sara Russell, female    DOB: [birth year], 77 y.o.   MRN: [id number]  HPI Several issues:  1. FU diarrhea.  Still no blood or abd pain.  Much improved with only 3 loose stools per day. 2. Hypertension.  Stopped all meds except metoprolol with diarrhea.  Now back taking amlodipine.  Still off HCTZ and benazepril. Due for chol recheck.     Review of Systems     Objective:   Physical Exam VS reviewed Abd benign        Assessment & Plan:

## 2010-05-10 NOTE — Assessment & Plan Note (Signed)
Improving - although we still lack a specific diagnosis.  Will need to recheck labs

## 2010-05-10 NOTE — Assessment & Plan Note (Signed)
Recheck labs 

## 2010-05-10 NOTE — Assessment & Plan Note (Signed)
Assuming labs OK, restart benazapril.

## 2010-05-23 ENCOUNTER — Telehealth: Payer: Self-pay | Admitting: *Deleted

## 2010-05-23 ENCOUNTER — Telehealth: Payer: Self-pay | Admitting: Family Medicine

## 2010-05-23 DIAGNOSIS — R197 Diarrhea, unspecified: Secondary | ICD-10-CM

## 2010-05-23 NOTE — Telephone Encounter (Signed)
Sara Russell want you to go ahead with the referral to the GI practice since she is still having problems with diarrhea.  Call and lv msg if she is not at home.

## 2010-05-23 NOTE — Assessment & Plan Note (Signed)
Called and still has diarrhea.  Has seen Dr. Olevia Perches before and would like to see now.  Agree

## 2010-05-23 NOTE — Telephone Encounter (Signed)
Sara Russell called back to say she sees [name] at [place]

## 2010-05-23 NOTE — Telephone Encounter (Signed)
appt made for 07/03/2010 @ 1015 with Dr. Keenan Bachelor [facility]. Pt informed and agreed.Sara Russell

## 2010-05-25 ENCOUNTER — Telehealth: Payer: Self-pay | Admitting: Internal Medicine

## 2010-05-25 NOTE — Telephone Encounter (Signed)
Patient was referred by Dr. Andria Frames for diarrhea. Patient states she has had diarrhea for a month now. When it started out she was having diarrhea 20 times+/day. Now she is having diarrhea 5-6 times/day. Diarrhea is watery. She is scheduled on 06/27/10 but would like to be seen earlier. Scheduled patient on 05/29/10 at 3:00PM. Last colon- 2007- dimunitive polyp, diverticulosis.

## 2010-05-25 NOTE — Telephone Encounter (Signed)
OK I will see her and review records from Dr Andria Frames

## 2010-05-25 NOTE — Telephone Encounter (Signed)
Unable to reach patient.

## 2010-05-28 LAB — BASIC METABOLIC PANEL
BUN: 20 mg/dL (ref 6–23)
CO2: 26 mEq/L (ref 19–32)
Calcium: 9.6 mg/dL (ref 8.4–10.5)
Chloride: 102 mEq/L (ref 96–112)
Creatinine, Ser: 1.01 mg/dL (ref 0.4–1.2)
GFR calc Af Amer: >60 mL/min (ref >60)
GFR calc non Af Amer: 53 mL/min — ABNORMAL LOW (ref >60)
Glucose, Bld: 98 mg/dL (ref 70–99)
Potassium: 4.1 mEq/L (ref 3.5–5.1)
Sodium: 137 mEq/L (ref 135–145)

## 2010-05-28 LAB — CBC
HCT: 38.7 % (ref 36.0–46.0)
Hemoglobin: 13.3 g/dL (ref 12.0–15.0)
MCHC: 34.4 g/dL (ref 30.0–36.0)
MCV: 89.4 fL (ref 78.0–100.0)
Platelets: 272 10*3/uL (ref 150–400)
RBC: 4.32 MIL/uL (ref 3.87–5.11)
RDW: 12.6 % (ref 11.5–15.5)
WBC: 5 10*3/uL (ref 4.0–10.5)

## 2010-05-29 ENCOUNTER — Encounter: Payer: Self-pay | Admitting: Internal Medicine

## 2010-05-29 ENCOUNTER — Ambulatory Visit (INDEPENDENT_AMBULATORY_CARE_PROVIDER_SITE_OTHER): Payer: Medicare Other | Admitting: Internal Medicine

## 2010-05-29 DIAGNOSIS — R197 Diarrhea, unspecified: Secondary | ICD-10-CM

## 2010-05-29 DIAGNOSIS — K5289 Other specified noninfective gastroenteritis and colitis: Secondary | ICD-10-CM

## 2010-05-29 DIAGNOSIS — D509 Iron deficiency anemia, unspecified: Secondary | ICD-10-CM

## 2010-05-29 NOTE — Patient Instructions (Signed)
You have been scheduled for a flexible sigmoidoscopy. Please follow the written instructions given to you at your visit today.

## 2010-05-29 NOTE — Progress Notes (Signed)
Sara Russell April 27, 1930 MRN [id number]     History of Present Illness:  This is a 77 year old white female with a 4 week history of watery diarrhea which occurs during the day as well as at night.She also has stool incontinence. There has been no blood, fever or chills. Her stool culture was negative, stool for ova was negative. C. difficile toxin was negative, fecal lactoferrin was negative. She has a history of Barrett's esophagus not documented on her last endoscopy in July 2011. She had chronic GI blood loss evaluated with a recent small bowel capsule endoscopy. It showed a suspicious bulge in the jejunum 40 minutes distal from the duodenal bulb. An enteroscopy did not show any structural abnormality or AVMs. She has done well on iron supplements and her last hemoglobin was 12.5, hematocrit 35. She denies abdominal pain. She has a history of hypertension. Her weight initially decreased when her husband passed away 6 months ago but has remained stable recently. A small bowel biopsy in July 2011 showed normal villous pattern.   Past Medical History  Diagnosis Date  . S/P dilatation of esophageal stricture 2002  . Diverticulosis   . Hypertension   . Hyperlipidemia   . DVT (deep venous thrombosis) 2009  . PE (pulmonary embolism) 2009  . GERD (gastroesophageal reflux disease)   . Diverticulosis   . Anemia   . Hyperplastic colon polyp   . Hiatal hernia   . Positive H. pylori test 01/20/96   Past Surgical History  Procedure Date  . Dilation and curettage of uterus     for endometrial polyps  . Replacement total knee     left  . Tonsillectomy   . Appendectomy   . Breast lumpectomy     benign    reports that she has quit smoking. She quit smokeless tobacco use about 49 years ago. She reports that she drinks alcohol. She reports that she does not use illicit drugs. family history includes Colon cancer in her brother and Esophageal cancer in her brother. Allergies  Allergen  Reactions  . Iohexol   . Sulfamethoxazole     REACTION: unspecified        Review of Systems: Denies chest pain, dysphagia, shortness of breath, negative for abdominal pain, negative for rectal bleeding.  The remainder of the 10  point ROS is negative except as outlined in H&P   Physical Exam: General appearance  Well developed, in no distress. Eyes- non icteric. HEENT nontraumatic, normocephalic. Mouth no lesions, tongue papillated, no cheilosis. Neck supple without adenopathy, thyroid not enlarged, no carotid bruits, no JVD. Lungs Clear to auscultation bilaterally. Cor normal S1 normal S2, regular rhythm , no murmur,  quiet precordium. Abdomen soft with decreased bowel sounds. No tenderness. No borborygmi. No bruits. Liver edge at costal margin. Rectal: Soft and dark Hemoccult negative stool. Extremities no pedal edema. Skin no lesions. Neurological alert and oriented x 3. Psychological normal mood and affect.  Assessment and Plan:  Problems #1 diarrhea. This is an acute onset diarrhea consistent with acute colitis. She is Hemoccult-negative and there is no abdominal pain. Possibilities include microscopic or lymphocytic colitis, ulcerative colitis or infectious colitis. There is a remote possibility that this could be a small bowel diarrhea from either metabolic causes such as carcinoid in which case she would need a 24-hour urine collection. We will proceed with a flexible sigmoidoscopy with mucosal biopsies to further assess her for colitis. She may need empirical treatment with Flagyl and Cipro depending on the  results of sigmoidoscopy. She should follow a low-residue diet.  Problem #2 iron deficiency anemia. This has been corrected with iron supplements. Patient had an abnormal small bowel capsule endoscopy in July 2011 suggesting abnormal mucosa in the jejunum. Her enteroscopy was negative as was her CT enterography. We will continue to monitor her blood and consider obtaining  an octreotide scan or 24-hour urine for HIAA.   05/29/2010 Sara Russell

## 2010-05-30 ENCOUNTER — Encounter: Payer: Self-pay | Admitting: Internal Medicine

## 2010-05-30 ENCOUNTER — Ambulatory Visit (AMBULATORY_SURGERY_CENTER): Payer: Medicare Other | Admitting: Internal Medicine

## 2010-05-30 ENCOUNTER — Encounter: Payer: Self-pay | Admitting: *Deleted

## 2010-05-30 VITALS — BP 130/74 | HR 76 | Temp 97.7°F | Resp 20 | Ht 62.0 in | Wt 175.0 lb

## 2010-05-30 DIAGNOSIS — K5289 Other specified noninfective gastroenteritis and colitis: Secondary | ICD-10-CM

## 2010-05-30 DIAGNOSIS — K5730 Diverticulosis of large intestine without perforation or abscess without bleeding: Secondary | ICD-10-CM

## 2010-05-30 DIAGNOSIS — R197 Diarrhea, unspecified: Secondary | ICD-10-CM

## 2010-05-30 DIAGNOSIS — K529 Noninfective gastroenteritis and colitis, unspecified: Secondary | ICD-10-CM

## 2010-05-30 MED ORDER — CIPROFLOXACIN HCL 250 MG PO TABS: 250.0000 mg | ORAL_TABLET | Freq: Three times a day (TID) | ORAL | Status: AC

## 2010-05-30 MED ORDER — SODIUM CHLORIDE 0.9 % IV SOLN: 500.0000 mL | INTRAVENOUS | Status: DC

## 2010-05-30 MED ORDER — METRONIDAZOLE 250 MG PO TABS: 250.0000 mg | ORAL_TABLET | Freq: Three times a day (TID) | ORAL | Status: AC

## 2010-05-30 NOTE — Patient Instructions (Signed)
Findings:  Colitis, Diverticulosis  Recommendations:  Cipro 250 Mg Two times a day.  Prescription given.                                    Flagyl 250 mg Three times a day.  Prescription given                                    Low residue diet                                    Office visit in two weeks.  Office will call with appointment.

## 2010-05-31 ENCOUNTER — Telehealth: Payer: Self-pay

## 2010-05-31 NOTE — Telephone Encounter (Signed)

## 2010-06-05 ENCOUNTER — Other Ambulatory Visit (INDEPENDENT_AMBULATORY_CARE_PROVIDER_SITE_OTHER): Payer: Medicare Other | Admitting: Family Medicine

## 2010-06-05 ENCOUNTER — Telehealth: Payer: Self-pay | Admitting: Internal Medicine

## 2010-06-05 ENCOUNTER — Encounter: Payer: Self-pay | Admitting: Internal Medicine

## 2010-06-05 ENCOUNTER — Telehealth: Payer: Self-pay | Admitting: *Deleted

## 2010-06-05 DIAGNOSIS — I10 Essential (primary) hypertension: Secondary | ICD-10-CM

## 2010-06-05 MED ORDER — BUDESONIDE 3 MG PO CP24: ORAL_CAPSULE | ORAL | Status: DC

## 2010-06-05 MED ORDER — BENAZEPRIL HCL 40 MG PO TABS: 40.0000 mg | ORAL_TABLET | Freq: Every day | ORAL | Status: DC

## 2010-06-05 NOTE — Telephone Encounter (Signed)
Notified pt I ordered her Entocort at [facility].

## 2010-06-05 NOTE — Progress Notes (Signed)
Addended by: Shella Maxim on: 06/05/2010 03:47 PM   Modules accepted: Orders

## 2010-06-05 NOTE — Telephone Encounter (Signed)
Colon biopsies from recent flex sigmoid. Confirm lymphocytic colitis. She will stop Cipro/Flagyl, please call in Entecort 3 mg, #100, 3 po qd x 2 weeks, 2 po qd x 2 weeks, 1 po qd x 2 weeks.

## 2010-06-14 NOTE — Consult Note (Signed)
Sara Russell, Sara Russell              ACCOUNT NO.:  [id number]  MEDICAL RECORD NO.:  [id number]           PATIENT TYPE:  E  LOCATION:  [facility]                         FACILITY:  [place]  PHYSICIAN:  Dickie La, MD        DATE OF BIRTH:  [birth year]  DATE OF CONSULTATION:  04/29/2010 DATE OF DISCHARGE:                                CONSULTATION   PRIMARY CARE PHYSICIAN:  Jamal Collin. Andria Frames, MD, of [facility].  HISTORY OF PRESENT ILLNESS:  The patient is a 77 year old female who came to the ER for evaluation of diarrhea times 3 days and weakness and fatigue.  The patient [place]  On return home on Friday, 3 days ago, she felt very weak and fatigued.  No improvement on Saturday and on Sunday, today, felt lightheaded and was concerned that her hemoglobin had dropped.  She was concerned about this because she has a past medical history of GI bleed.  Also has had diarrhea over the weekend that increased in frequency to 15-20 episodes today prior to arrival to emergency department.  On arrival to the ER, her hemoglobin was found to be 12.7.  Her last hemoglobin 2 weeks ago was 11.8.  The patient was evaluated and found to have sodium of 128, potassium of 3.0.  The patient's point-of-care cardiac enzymes were within normal limits.  No changes on EKG.  Normal sinus rhythm with no ST changes, creatinine 1.28.  The patient's baseline creatinine range is 0.88-1.22.  Fecal occult blood was negative.  The ER doctor requested that I come down to see the patient to decide if further workup should be done inpatient or outpatient.  REVIEW OF SYSTEMS:  No fever, no chest pain, no nausea or vomiting, no shortness of breath at rest.  One episode of shortness of breath on exertion when going up the stairs with suitcase last week.  No  blurry vision.  No syncope.  No swelling or edema.  No abdominal pain.  No bright red stools.  Positive dark stools; the  patient states this is her normal because she is on iron supplements.  No palpitations.  PHYSICAL EXAMINATION:  VITAL SIGNS:  Blood pressure 110/90, pulse 79, temp 98.1, respiratory rate 20, pulse ox 97%. GENERAL:  The patient is alert and oriented x3.  She is well developed, well nourished. HEENT:  Head normocephalic.  External ears normal.  Nose exam normal. Oropharynx, clear and moist.  No exudate.  Extraocular movements are intact.  Pupils equal, round, reactive to light and accommodation.  No eye discharge.  The patient has no cervical lymphadenopathy. NECK:  Normal range of motion. CARDIOVASCULAR:  Normal rate, regular rhythm.  No gallops, rubs, or murmurs. PULMONARY:  Effort normal and breath sounds normal.  No respiratory distress.  No wheezing.  No tenderness on palpation of chest. ABDOMEN:  Soft.  Bowel sounds normal.  No distention.  No tenderness, no rebound, no guarding.  Diffuse tenderness on deep palpation of the lower abdomen.  Otherwise no tenderness on exam. MUSCULOSKELETAL:  Normal range of motion in  all four extremities. Strength 5/5 in upper and lower extremities, equal, symmetric. NEUROLOGICAL:  She is alert, oriented to person, place, and time. SKIN:  Warm and dry. PSYCHIATRIC:  The patient has normal mood and affect.  Her behavior is normal.  Judgement and though content appear to be normal.  The patient is tearful when talking about her husband who passed away last fall.  ASSESSMENT AND PLAN: 1. Hypokalemia, most likely this is due to GI losses from diarrhea.     The patient given KCl 40 mEq in the ER.  The patient to take second     dose of KCl at home tonight of 40 mEq.  Although not on med list,     the patient states she has potassium at home that she takes as     needed for leg cramping.  The patient will get an appointment     tomorrow morning.  The provider may want to recheck potassium     tomorrow or later in the week to make sure this is within  normal     limits. 2. Hyponatremia, most likely due to GI losses.  The patient received     IV rehydration in the ER with normal saline.  The patient's sodium     usually is slightly low.  See sodium trend under the lab section,     which is lower than normal today at 128.  We will recheck sodium at     the followup appointment to ensure it is back to the patient's     baseline. 3. Diarrhea improved after arrival to ER most likely secondary to     viral etiology.  We will follow the patient in outpatient clinic     tomorrow to ensure that this continues to resolve.  The patient     encouraged to continue p.o. fluid. 4. Fatigue, most likely secondary to diarrhea and fluid losses,     particularly worsened by baseline grief and depression since loss     of husband last fall.  We will followup the patient at the clinic     appointment tomorrow to ensure that the fatigue is improving. 5. Grief.  The patient tearful during exam when she mentioned the     recent loss of her husband in the fall of 2011.  Counseled the     patient about the importance of reaching out with Dr. Andria Frames.  She     feels she needs help with coping.  The patient states that she is     doing bad at this point.  I think that grief counseling offered     through Hospice will be beneficial for this patient as well as     medical treatment with SSRI.  The patient is not into these things     at this time.  We will have the patient's PCP to follow up on this     sensitive subject. 6. Hypertension.  Blood pressure within normal limits in the emergency     department.  His creatinine is mildly elevated.  We will hold     benazepril in the setting of diarrhea.  We will have the patient     hold this medication until followup appointment with the doctor     tomorrow.  FOLLOWUP PLANS:  The patient is instructed to hold benazepril 24 hours until followup clinic at [facility].  Take potassium as directed in  above assessment and plan.  The patient is also encouraged drink fluids.  The patient is to call clinic at 8:30 a.m. to get work and appointment.  Afford this consult note to clinic staff for the reference at time of the patient's phone call.     Dorthey Sawyer, MD   ______________________________ Dickie La, MD    DC/MEDQ  D:  04/29/2010  T:  04/30/2010  Job:  [id number]  Electronically Signed by Dorthey Sawyer  on 05/29/2010 02:25:44 PM Electronically Signed by Dorcas Mcmurray MD on 06/14/2010 09:32:16 AM

## 2010-06-18 ENCOUNTER — Encounter: Payer: Self-pay | Admitting: Internal Medicine

## 2010-06-18 ENCOUNTER — Other Ambulatory Visit (INDEPENDENT_AMBULATORY_CARE_PROVIDER_SITE_OTHER): Payer: Medicare Other

## 2010-06-18 ENCOUNTER — Ambulatory Visit (INDEPENDENT_AMBULATORY_CARE_PROVIDER_SITE_OTHER): Payer: Medicare Other | Admitting: Internal Medicine

## 2010-06-18 DIAGNOSIS — D849 Immunodeficiency, unspecified: Secondary | ICD-10-CM

## 2010-06-18 DIAGNOSIS — K5289 Other specified noninfective gastroenteritis and colitis: Secondary | ICD-10-CM

## 2010-06-18 DIAGNOSIS — R634 Abnormal weight loss: Secondary | ICD-10-CM

## 2010-06-18 LAB — GLUCOSE, RANDOM: Glucose, Bld: 103 mg/dL — ABNORMAL HIGH (ref 70–99)

## 2010-06-18 NOTE — Patient Instructions (Addendum)
Your physician has requested that you go to the basement for the following lab work before leaving today: 3 hour post prandial glucose. Please take your entocort 3 tablets daily x 3 weeks, then 2 tablets daily x 3 weeks, then 1 tablet daily x 3 weeks. If you need a refill on the Entocort, please give [place] a call. CC:Dr Madison Hickman

## 2010-06-18 NOTE — Progress Notes (Signed)
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 A flexible sigmoidoscopy on 05/30/2010 showed an increased lymphocytic infiltrate consistent with microscopic colitis. She was started on Entocort 9 mg daily with almost instant improvement of her symptoms. She is having 3-4 bowel movements a day which is her baseline but the bowel movements are still soft and ropey. She denies abdominal pain or  denies rectal bleeding.   Past Medical History  Diagnosis Date  . S/P dilatation of esophageal stricture 2002  . Diverticulosis   . Hypertension   . Hyperlipidemia   . DVT (deep venous thrombosis) 2009  . PE (pulmonary embolism) 2009  . GERD (gastroesophageal reflux disease)   . Diverticulosis   . Anemia   . Hyperplastic colon polyp   . Positive H. pylori test 01/20/96  . Arthritis    Past Surgical History  Procedure Date  . Dilation and curettage of uterus     for endometrial polyps  . Replacement total knee     left  . Tonsillectomy   . Appendectomy   . Breast lumpectomy     benign    reports that she has quit smoking. She quit smokeless tobacco use about 49 years ago. She reports that she drinks alcohol. She reports that she does not use illicit drugs. family history includes Diverticulitis in her brother and Esophageal cancer in her brother.  There is no history of Colon cancer. Allergies  Allergen Reactions  . Iohexol   . Sulfamethoxazole     REACTION: unspecified        Review of Systems: Negative for chest pain dysphagia or odynophagia. Denies shortness of breath or palpitations.  The remainder of the 10  point ROS is negative except as outlined in H&P     Assessment and Plan:  Problem #1 microscopic colitis improved on Entocort 9 mg daily. Her bowel habits almost back to normal. She has remained on Entocort for 3 weeks and has started a  taper every 3 weeks by 30 mg. She will call with a status outpatient in the next 6 weeks. We will check her glucose level today.  Problem #2 History iron deficiency anemia. This is currently corrected on iron supplements.    06/18/2010 [name]

## 2010-06-19 ENCOUNTER — Telehealth: Payer: Self-pay | Admitting: *Deleted

## 2010-06-19 NOTE — Telephone Encounter (Signed)
Message copied by Leone Payor on Tue Jun 19, 2010 10:27 AM ------      Message from: [name]      Created: Mon Jun 18, 2010 10:47 PM       Please call pt with normal 3 hour postprandial blood sugar.

## 2010-06-19 NOTE — Telephone Encounter (Signed)
Left message for patient to call me

## 2010-06-19 NOTE — Telephone Encounter (Signed)
Message copied by Leone Payor on Tue Jun 19, 2010  9:26 AM ------      Message from: [name]      Created: Mon Jun 18, 2010 10:47 PM       Please call pt with normal 3 hour postprandial blood sugar.

## 2010-06-19 NOTE — Telephone Encounter (Signed)
Patient notified of results as per Dr. Brodie. 

## 2010-06-26 NOTE — Assessment & Plan Note (Signed)
NAME:  Sara Russell, Sara Russell              ACCOUNT NO.:  [id number]   MEDICAL RECORD NO.:  [id number]          PATIENT TYPE:  POB   LOCATION:  [place] [facility] [place]:  Andrew Au, MD        DATE OF BIRTH:  Jan 24, 1931   DATE OF SERVICE:  08/17/2009                                  CLINIC NOTE   This patient was referred to [place] by the [facility] where she  has been the patient for many years.  On a PET and ultrasound was found  to have a 6.2 x 5.5 left adnexal mass that appeared to be a simple cyst  on the left ovary.  The interesting part here is that she also has a  slight dilated ureter above the ovary on that side, that is the side  which in the past many years ago she had two episodes of renal  lithiasis.  These tests that she had done, the CAT scan and the  ultrasound, were preceded by symptoms that the patient elicited while on  vacation with shortness of breath.  She went in to see her doctor and  found to have a hemoglobin of 5.6, was immediately admitted to the  hospital, transfused 2 units of packed red cells which brought her up to  hemoglobin in the area of 9 g and so far many tests have been done to  find the origin of the bleeding or the drop in hemoglobin with the  preponderance of the tests closing in on the GI system.  The day prior  to coming to [place], she had an ultrasound which had confirmed the cyst on  the ovary on the left side and further described it as simple and  therefore unlikely to be a neoplasm.  I have suggested that we get a CA-  125 on this patient and if it is normal, we will feel much better about  the recommendation of the radiologist of repeating the ultrasound in a  year and would still feel better concurring with her internist to repeat  the ultrasound in 6 months.  If, however, the CA-125 is elevated,  although that does not mean that she has a neoplasm on that side, I  would think about referring her to Dr.  Marti Sleigh for a  further consultation.  The patient has been placed on iron among all her  other medications and seems in good health.  Today, we did not do any  physical examination.  I did not think that they were going to find  anything that would change our idea and we will just await the results  of the CA-125 and make a decision at that point about followup.   IMPRESSION:  Severe unexplained anemia with a large simple left ovarian  cyst and secondary probable hydroureter on the left side.           ______________________________  Andrew Au, MD     PR/MEDQ  D:  08/17/2009  T:  08/18/2009  Job:  [id number]

## 2010-06-26 NOTE — Op Note (Signed)
Sara Russell, Sara Russell              ACCOUNT NO.:  [id number]   MEDICAL RECORD NO.:  [id number]          PATIENT TYPE:  AMB   LOCATION:  SDS                          FACILITY:  [facility]   PHYSICIAN:  Sara Russell, M.D. DATE OF BIRTH:  15-May-1930   DATE OF PROCEDURE:  02/19/2008  DATE OF DISCHARGE:                               OPERATIVE REPORT   PREOPERATIVE DIAGNOSIS:  Right laryngeal ventricular mass.   POSTOPERATIVE DIAGNOSIS:  Right laryngeal ventricular cyst.  Right  vallecular cyst.   PROCEDURE PERFORMED:  Microlaryngoscopy with biopsy/unroofing, right  ventricular cyst, right vallecular cyst.   SURGEON:  Karol T. Erik Obey, MD   ANESTHESIA:  General orotracheal.   BLOOD LOSS:  None.   COMPLICATIONS:  Minor pinch to the lower lip.   FINDINGS:  A reddish hemispheric 4-5 mm lesion in the anterior right  laryngeal ventricle.  Upon attempting to do a biopsy, the mass was  deflated.  Portions of the cyst wall and then deep into the ventricle  were biopsied.  Initial frozen section returned as a ductal benign  retention cyst.   A small basically incidental cyst in the right vallecula, which upon  biopsying deflated with a standard creamy mucoid content.   PROCEDURE:  With the patient in a comfortable supine position, with an  acrylic bite guard fashioned by her dentist in place to protect her  upper dentition, general orotracheal anesthesia was induced without  difficulty.  She was noted to have a short jaw, thick tongue, and  somewhat difficult intubation.  Anesthesia was deepened per orotracheal  tube.   At an appropriate level, the table was turned 90 degrees and the patient  placed in a slight reverse Trendelenburg.  The dental guard remained in  place.  The laser anterior commissure laryngoscope was introduced and  was much too large to enter the glottis.  The vallecular cyst was  visualized with the findings as described above.  A smaller anterior  commissure scope  was attempted, this also was too large.  Finally, the  standard Hollinger anterior commissure laryngoscope was introduced and  this was able to be placed into the glottis along with the 6.5  endotracheal tube.  At this point, the scope was manipulated and finally  the anterior glottis was visualized with the findings as described  above.  Laryngoscope was suspended in the standard fashion.   An 1:100,000 epinephrine solution on a 1.5 x 1-inch cotton padding was  placed against the right laryngeal ventricular lesion for intraoperative  hemostasis.  This was allowed to remain in place for several minutes.  It was removed.  Under direct vision, using a micro up-cup forceps, the  posterior aspect of the mass was biopsied and a small amount of mucus  was released and the lesion was deflated.  Several additional bites with  the biopsy forceps were taken to essentially unroof the cyst.  There was  no visible lesion in the depth of the ventricle.  These were sent for  frozen and permanent pathologic interpretation.  A pledget with 1:11000  epinephrine was placed against the raw surface  for hemostasis.   While waiting for the frozen section to return, the laryngoscope was  unsuspended and backed out slightly and the vallecular cyst was  identified.  A biopsy deflated the cyst and several biopsies were taken  to open.  These were sent for routine pathologic interpretation.   The pathology refrozen section returned as a benign cyst.  At this  point, the endolarynx was inspected one more time, and hemostasis was  observed.  The procedure was completed.  The laryngoscope was removed.  The dental guard remained in good position.  There was a pinch of the  right lower lip, which was coated with some antibiotic ointment.  The  patient was returned to anesthesia, awakened, extubated, and transferred  to recovery in stable condition.   COMMENT:  A 77 year old white female with a history of hoarseness  was  observed on physical examination to have a cystic structure in the right  anterior laryngeal ventricle.  MRI scan showed this to appear cystic.  The possibility of a ventricular lesion including carcinoma was felt to  warrant endoscopic evaluation and biopsy.  The frozen section returned  benign, but we will wait for the permanent interpretation.  Otherwise,  anticipate a routine postoperative recovery with attention to ice,  elevation, analgesia, and advancement of diet and activity.  Given low  anticipated risk of postanesthetic or postsurgical complications, feel  an outpatient venue was appropriate.      Sara Russell, M.D.  Electronically Signed     KTW/MEDQ  D:  02/19/2008  T:  02/19/2008  Job:  [id number]   cc:   William A. Andria Frames, M.D.

## 2010-06-29 ENCOUNTER — Ambulatory Visit: Payer: PRIVATE HEALTH INSURANCE | Admitting: Internal Medicine

## 2010-06-29 NOTE — Op Note (Signed)
NAMEJUAN, Russell              ACCOUNT NO.:  [id number]   MEDICAL RECORD NO.:  [id number]          PATIENT TYPE:  INP   LOCATION:  2956                         [facility]ACILITY:  [facility]   PHYSICIAN:  Gaynelle Arabian, M.D.    DATE OF BIRTH:  11-19-1930   DATE OF PROCEDURE:  05/01/2005  DATE OF DISCHARGE:                                 OPERATIVE REPORT   PREOPERATIVE DIAGNOSIS:  Osteoarthritis, left knee.   POSTOPERATIVE DIAGNOSIS:  Osteoarthritis, left knee.   PROCEDURE:  Left total knee arthroplasty computer navigated.   SURGEON:  Gaynelle Arabian, M.D.   ASSISTANT:  Arlee Muslim, PA-C.   ANESTHESIA:  Spinal.   ESTIMATED BLOOD LOSS:  Minimal.   DRAINS:  Hemovac x1.   TOURNIQUET TIME:  52 minutes at 300 mmHg.   COMPLICATIONS:  None.   CONDITION:  Stable to recovery.   CLINICAL NOTE:  [name] is a 77 year old female with end-stage arthritis  of the left knee with intractable pain. She has failed nonoperative  management and presents for total knee arthroplasty. She had a previous  ankle fracture with rotational deformity and we have decided to do the  procedure computer navigated to account for the angular deformity.   PROCEDURE IN DETAIL:  After successful administration of spinal anesthetic,  a tourniquet was placed high on the left thigh and left lower extremity  prepped and draped in the usual sterile fashion. Extremities wrapped in  Esmarch, knee flexed, tourniquet inflated to 300 mmHg. A midline incision is  made with a 10 blade through the subcutaneous tissue to the level of the  extensor mechanism. A fresh blade is used to make a medial parapatellar  arthrotomy. The soft tissue over the proximal medial tibia is  subperiosteally elevated to the joint line with a knife and into the  semimembranosus bursa with a Cobb elevator. The soft tissue laterally is  elevated with attention being paid to avoiding the patellar tendon on the  tibial tubercle. The patella was everted,  knee flexed 90 degrees, ACL and  PCL removed. The 4 mm Shantz screws were then placed 2 into the tibia and 2  into the femur for placement of the computerized arrays. Anatomic data is  then input into the computer for generation of the femoral and tibial  models. The alignment is approximately 7 degrees of valgus with a 5 degree  flexion contracture.   We first addressed the femoral cuts. The distal femoral cutting block is  placed so as to remove 10 mm from the distal femur in neutral varus-valgus  and neutral flexion/extension. The block is pinned and the resection made  with an oscillating saw. Verification device was placed and confirms that  the cut was made as planned. The computer-generated a size 3 for the size.  We checked the Interop measuring guide and indeed it was a size 3.  Rotational block is placed and then the size 3 cutting block placed and the  anterior, posterior and chamfer cuts were made. The verification device  confirms the cuts made as planned.   The tibia is then subluxed forward and  the menisci are removed. Tibial  cutting block is placed under computer guidance. We plan on resecting 10 mm  off the less deficient medial side with about 2 degrees of posterior slope  in neutral varus-valgus. The block is pinned and resection made with an  oscillating saw. The computer confirms that the cut is made as planned. A  size 3 is going to be the most appropriate tibial component. The evaluation  bullet is placed through the 3 baseplate. The femoral preparation is  completed with the intercondylar cut for the size 3.   The size 3 posterior stabilized femoral trial with the size 3 tibial trial  and a 10 mm posterior stabilized rotating platform insert trial are placed.  With the 10, there is a little bit of varus-valgus play and so  we went to  12.5. The 12.5 had great stability. It allowed for full extension with  flexion down beyond 130 degrees with great stability  throughout. The medial  and lateral gaps are equal both at full extension and 90 degrees of flexion.  The alignment is neutral with full extension achieved. The patella is then  everted and thickness measured to be 21 mm. Freehand resection is taken to  11 mm, 35 template is placed, lug holes were drilled and trial patella was  placed. The patella tracks normally. The pins for the computer arrays were  then removed. The proximal tibia is then prepared with the modular drill and  keel punch for the size 3. Osteophytes are removed off the posterior femur.  All trials are removed and the cut bone surfaces are prepared with pulsatile  lavage. The cement is mixed and once ready for implantation a size 3 mobile  bearing tibial tray, size 3 posterior stabilized femur and 35 patella are  cemented into place. Once the components were cemented into place then the  trial 12.5 mm insert is placed, knee held in full extension, all extruded  cement removed. Once the cement was fully hardened then the permanent 12.5  mm posterior stabilized rotating platform insert is placed in the tibial  tray. The wound was copiously irrigated with saline solution and the  extensor mechanism closed over a Hemovac drain with interrupted #1 PDS.  Flexion against gravity is 130 degrees. The tourniquet is released with a  total time of 52 minutes. The subcu is closed with interrupted 2-0 Vicryl  and subcuticular with running 4-0 Monocryl. The incision is cleaned and  dried and Steri-Strips and a bulky sterile dressing applied. She is awakened  and transported to recovery in stable condition.      Gaynelle Arabian, M.D.  Electronically Signed     FA/MEDQ  D:  05/01/2005  T:  05/02/2005  Job:  [id number]

## 2010-06-29 NOTE — Discharge Summary (Signed)
[name], [name]              ACCOUNT NO.:  [id number]   MEDICAL RECORD NO.:  [id number]          PATIENT TYPE:  INP   LOCATION:  5631                         FACILITY:  [facility]   PHYSICIAN:  Gaynelle Arabian, M.D.    DATE OF BIRTH:  November 21, 1930   DATE OF ADMISSION:  05/01/2005  DATE OF DISCHARGE:  05/04/2005                                 DISCHARGE SUMMARY   ADMITTING DIAGNOSES:  1.  Osteoarthritis left knee.  2.  Seasonal allergies.  3.  Hypercholesterolemia.  4.  Past history of pulmonary embolism.  5.  Hypertension.  6.  Reflux disease.  7.  Diverticulosis.  8.  History of urinary tract infections.  9.  Osteoporosis.  10. Postmenopausal.   DISCHARGE DIAGNOSES:  1.  Osteoarthritis left knee status post left total knee arthroplasty.  2.  Mild postoperative blood loss anemia.  3.  Postoperative hyponatremia, improved.  4.  Seasonal allergies.  5.  Hypercholesterolemia.  6.  Past history of pulmonary embolism.  7.  Hypertension.  8.  Reflux disease.  9.  Diverticulosis.  10. History of urinary tract infections.  11. Osteoporosis.  12. Postmenopausal.   PROCEDURE:  May 01, 2005:  Left total knee arthroplasty, computer  navigation assisted.  Surgeon:  Dr. Wynelle Link.  Assistant:  Arlee Muslim, PA-  C.  Spinal anesthesia.  Tourniquet time 50 minutes.   CONSULTS:  None.   BRIEF HISTORY:  Sara Russell is a 77 year old female with end-stage arthritis  of the left knee with intractable pain who failed nonoperative management  and now presents for total knee.   LABORATORY DATA:  CBC preoperatively done on an outpatient basis:  Hemoglobin 13.7, hematocrit of 40.4.  Postoperative hemoglobin 10.3, drifted  down.  Last noted H&H was 9.4 and 27.5.  PT, INR, and PTT 14.0, 1.1, and 29  respectively on admission.  Serial protimes followed.  Last noted PT/INR  14.8 and 1.1.  Chem panel on admission:  Low potassium at 3.4; the remaining  chem panel all within normal limits.  Potassium  came up to 4.1, sodium  dropped to 129 and came back up to 135.  Urinalysis preoperatively negative.  Blood group/type A negative.   HOSPITAL COURSE:  Admitted to [facility], tolerated the procedure  well, later transferred to the recovery room and the orthopedic floor.  Started on PCA and p.o. analgesics for pain control following surgery.  Surgery was done under spinal anesthetic.  The patient was noted to have a  drop in her sodium and potassium.  It was only a mild drop in her potassium  which was low normal.  She was given a little potassium supplement.  Hemovac  drain was pulled on day #1.  A drop in sodium, fluids were made KVO.  By the  next day the sodium had come back up, potassium had improved.  Started  getting up out of bed by day #2.  Was running some temperature of 102.0,  treated with antipyretics and incentive spirometer.  Dressing changed,  incision looked good.  Actually got up and walked about 15 feet on day #  2,  but got up a little bit more that evening.  By the following day was  ambulating greater than 200 feet.  Dressing was changed, incision looked  good, no signs of infection, was doing well, and was discharged home.   DISCHARGE MEDICATIONS AND PLAN:  1.  The patient discharged home on May 04, 2005.  2.  Discharge diagnoses:  Please see above.  3.  Discharge medications:  Percocet, Robaxin, and Coumadin, Lovenox for 2      more days.  4.  Diet:  Resume previous home diet.  5.  Activity:  Weightbearing as tolerated, total knee protocol, home health      PT and home health nursing.   DISPOSITION:  Home.   FOLLOWUP:  In 2 weeks.   CONDITION UPON DISCHARGE:  Improved.      Alexzandrew L. Dara Lords, P.A.      Gaynelle Arabian, M.D.  Electronically Signed    ALP/MEDQ  D:  06/12/2005  T:  06/13/2005  Job:  [id number]

## 2010-06-29 NOTE — Op Note (Signed)
[facility]  Patient:    Sara Russell, Sara Russell                     MRN: [id number] Proc. Date: 07/05/99 Adm. Date:  [id number] Disc. Date: [id number] Attending:  Richarda Blade                           Operative Report  PREOPERATIVE DIAGNOSIS:       Postmenopausal bleeding, history of endometrial polyp.  POSTOPERATIVE DIAGNOSIS:      Multiple endometrial polyps.  OPERATION:                    Hysteroscopy with resection of multiple endometrial polyps.  SURGEON:                      Selinda Orion, M.D.  ASSISTANT:  ANESTHESIA:  ESTIMATED BLOOD LOSS:  DESCRIPTION OF PROCEDURE:     The patient was placed in the lithotomy position,  anesthetized, osmotic dilator was removed, and the cervix was carefully dilated to admit a hysteroscope.  The cervix was quite stenotic so I had to resect the lower segment in order to get the hysteroscope into the uterus.  I was able to do that. The cavity was quite small.  There were two small polyps that were resected and a gentle endometrial resection was accomplished.  No unusual blood loss occurred.  Meliana tolerated this procedure well and was sent to the recovery room in good condition.  All of the resected material was sent to the lab for studies. DD:  07/05/99 TD:  07/08/99 Job: [id number]

## 2010-06-29 NOTE — Discharge Summary (Signed)
Section. [facility]  Patient:    Sara Russell, Sara Russell                     MRN: [id number] Adm. Date:  [id number] Disc. Date: [id number] Attending:  Fayrene Fearing Dictator:   Melissa L. Mateo Flow, M.D.                           Discharge Summary  DISCHARGE MEDICATIONS: 1. Lovenox 80 mg subcu b.i.d. x 2 days. 2. Coumadin 10 mg p.o. at suppertime tonight and then dosing based on PT/INR.  Other home medicines as before.  She is to follow the Coumadin booklet regarding diet and activity.  She is to place moist heat on her effected joints.  She is to come to the [facility] tomorrow for a PT/INR and Coumadin dosing.  Follow up with Dr. Oneida Alar in two weeks.  To return to the hospital for increased temperature, shortness of breath, or chest pain.  DISCHARGE DIAGNOSES: 1. Pulmonary embolus. 2. Polyarticular arthritis.  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  No fever, chills, cough, or sputum production.  No history of DVT.  V/Q scan was high probability for PE.  Chest x-ray showed atelectasis of the right base.  EKG showed normal sinus rhythm with no acute changes.  The patient was admitted for telemetry and anticoagulation with Lovenox 80 mg subcu q.12h. and was started on Coumadin per pharmacy dosing. The only source of emboili identified was the patients travel history as well as her use of estrogens.  Her hypercoagulation workup was negative, but factor V leiden is still pending at discharge.  The lower extremity Dopplers were negative.  The patient was discharged home on hospital day #6 with an INR of 1.8 and pro-time of 17.9.  Her lung examination cleared up nicely, she stopped having shortness of breath.  She did have a temperature x 1 to 102, but showed no infiltrate on chest x-ray.  After that, the  patient did well.  She also had some complaints of right elbow and knee arthritis which were relieved with Percocets and moist heat.  The patient was discharged home in satisfactory condition. DD:  07/21/00 TD:  07/21/00 Job: [id number]

## 2010-06-29 NOTE — H&P (Signed)
[place]  Patient:    Sara Russell, Sara Russell                     MRN: [id number] Adm. Date:  [id number] Attending:  Fayrene Fearing Dictator:   Lauro Regulus, M.D.                         History and Physical  CHIEF COMPLAINT: Chest pain.  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  She described pain in her lower chest and back, worse with inspiration.  She does have dyspnea at rest.  The symptoms began after she had been sitting.  She described the pain as sharp.  She denies any cough, sputum, fever or chills.  She did have recent travel approximately two months ago.  She denies any recent immobilization.  No history of coagulopathy.  She denies any leg pain.  No known sick contacts. No substernal chest pain.  No nausea or vomiting.  The patient has not had any pulmonary embolism in the past.  She has no history of DVTs.  PAST MEDICAL/SURGICAL HISTORY:  1. Hypercholesterolemia.  2. Hypertension.  3. Reflux esophagitis.  4. Actinic keratosis.  5. Osteopenia.  CURRENT MEDICATIONS:  1. Aspirin 325 mg q.d.  2. Atenolol 50 mg q.d.  3. Calcium acetate three q.d.  4. Fosamax 70 mg q.week.  5. MetroGel q.d.  6. Prempro 0.625/5 mg p.o. q.d.  7. Prilosec 20 mg q.d.  ALLERGIES:  1. CONTRAST DYE.  2. SULFA DRUGS.  SOCIAL HISTORY: The patient smoked for 13 years and she quit in 1963.  She is a retired Marine scientist.  She worked at [employer]. [employer].  She is a social drinker.  FAMILY HISTORY: Significant for coronary artery disease and CVA.  REVIEW OF SYSTEMS: The patient denies any weight loss.  No substernal chest pain.  No palpitations.  No nausea, vomiting, or diarrhea.  No rash.  No new joint pain.  No sore throat.  No cough.  No dysuria.  No polyuria.  PHYSICAL EXAMINATION:  VITAL  SIGNS: Temperature 97.4 degrees, blood pressure 170/82, respiratory rate 20, pulse 74.  Oxygen saturation 97%.  GENERAL: Pleasant female, in no respiratory distress.  The patient is alert and oriented x 3.  HEENT: Sclerae nonicteric.  Mucous membranes moist.  NECK: No lymphadenopathy, no thyromegaly.  SKIN: There is actinic keratosis noted over the right clavicle.  CORONARY: S1 and S2, regular rate and rhythm, no murmurs or rubs.  RESPIRATORY: There are decreased breath sounds and rales in the right base. There is no egophony or tactile fremitus.  No respiratory distress.  No adventitious sounds on the left.  ABDOMEN: Soft, nontender, nondistended, with bowel sounds.  RECTAL: Normal tone, guaiac negative.  EXTREMITIES: No edema.  Homans sign negative.  No erythema of lower extremities.  LABORATORY DATA: EKG shows normal sinus rhythm, no acute changes; nondiagnostic.  V/Q scan shows high probability for PE.  Chest x-ray shows atelectasis at the right base.  ASSESSMENT: The patient is a 77 year old with acute pleuritic chest pain, with ventilation perfusion scan with high probability for pulmonary embolus.  PLAN:  1. Pulmonary embolism.  Admit to telemetry for anticoagulation with Lovenox     80 mg subcu q.12h.  I will start Coumadin tomorrow.  No source of emboli     identified.  Will check coagulation factors such as protein C and S,     antithrombin 3, and factor 5 Leiden.  Will also obtain lower extremity     Dopplers.  Consider pelvic emboli as source also.  The patient will stop     her Prempro.  2. Hypertension, stable on Atenolol.  3. Hypercholesterolemia, stable.  4. Osteopenia.  Continue Tums.  The patient is on Fosamax q.week. DD:  07/15/00 TD:  07/16/00 Job: [id number]

## 2010-06-29 NOTE — H&P (Signed)
Sara Russell, Sara Russell              ACCOUNT NO.:  [id number]   MEDICAL RECORD NO.:  [id number]          PATIENT TYPE:  INP   LOCATION:  1093                         FACILITY:  [facility]   PHYSICIAN:  Gaynelle Arabian, M.D.    DATE OF BIRTH:  09-21-1930   DATE OF ADMISSION:  05/01/2005  DATE OF DISCHARGE:                                HISTORY & PHYSICAL   DATE OF OFFICE VISIT AND PHYSICAL:  April 26, 2005.   CHIEF COMPLAINT:  Left knee pain.   HISTORY OF PRESENT ILLNESS:  The patient is a 77 year old female  who has  been seen by Dr. Wynelle Link for progressive worsening left knee pain.  It has  been ongoing for several years now.  She has had an arthroscopy, multiple  injections and also viscosupplementation.  She has had a few series of  Hyalgan, most recently this past summer which did not help at all.  She  became very frustrated because her pain is limiting what she can and cannot  do.  She was seen by Dr. Wynelle Link for a second opinion.  X-rays show that she  has bone-on-bone changes focally in the lateral compartment as well as  patellofemoral compartment.  It is felt she is right at the point where she  could benefit from undergoing knee replacement.  Risks and benefits  discussed.  The patient subsequently admitted to the hospital.   ALLERGIES:  1.  SULFA causes chills and sweats.  2.  IVP DYE.  3.  Food allergy to SCALLOPS.   CURRENT MEDICATIONS:  1.  Atenolol 50 mg daily.  2.  Hydrochlorothiazide 12.5 mg daily.  3.  Lovastatin 40 mg daily.  4.  Fosamax 70 mg weekly.  5.  Aspirin 81 mg daily.  6.  Calcium 600 with vitamin D.  7.  Tums 1200 mg daily.  8.  Multivitamin daily.  9.  MetroGel 1% daily.  10. Benazepril HCl 10 mg daily.  11. Omeprazole 20 mg a day.   PAST MEDICAL HISTORY:  1.  Seasonal allergies.  2.  Hypercholesterolemia.  3.  Past history of pulmonary embolism.  4.  Hypertension.  5.  Reflux disease.  6.  Diverticulosis.  7.  Urinary tract infections.  8.   Osteoporosis.  9.  Osteoarthritis.  10. Postmenopausal.   SOCIAL HISTORY:  Married, retired Marine scientist.  Previous history of smoking but  stopped in 1963.  Two to three glasses of wine weekly.  Has three children,  two of which are natural born and one was adopted.  Husband will be  assisting with care after surgery.   FAMILY HISTORY:  Mother deceased at 49 with history of heart disease,  hypertension, and stroke.  Father deceased at 28 with a history of heart  disease, hypertension, arthritis.  A brother is deceased at 58 with  esophageal cancer.   REVIEW OF SYSTEMS:  GENERAL:  No fever, chills or night sweats.  NEUROLOGIC:  No seizures, syncope or paralysis.  RESPIRATORY:  She had a cold back in  January 2007 but this has resolved.  No shortness of breath, productive  cough, or hemoptysis.  She does have a past history of PE.  CARDIOVASCULAR:  No chest pain, angina or orthopnea.  GI:  No nausea, vomiting, diarrhea, or  constipation.  GU:  No dysuria, hematuria or discharge.  MUSCULOSKELETAL:  Left knee.   PHYSICAL EXAMINATION:  VITAL SIGNS:  Pulse 58, respirations 18, blood  pressure 152/84.  GENERAL:  This is a 77 year old white female,  well-nourished, well-  developed, short stature.  She is alert, oriented, cooperative, pleasant.  Excellent historian.  HEENT:  Normocephalic, atraumatic.  Pupils are round and reactive.  Oropharynx clear.  EOMs are intact.  NECK:  Supple.  No bruits.  CHEST:  Clear anterior and posterior chest wall.  No rhonchi, rales or  wheezing.  HEART:  Regular rate and rhythm.  No murmurs.  S1 and S2 noted.  ABDOMEN:  Soft, slightly round, nontender.  Bowel sounds present.  RECTAL/BREASTS/GENITALIA:  Not done and not pertinent to present illness.  EXTREMITIES:  Left lower extremity.  She does have crepitus noted on passive  range of motion. Slightly varus malalignment deformity.  Antalgic gait.   IMPRESSION:  1.  Osteoarthritis, left knee.  2.  Seasonal  allergies.  3.  Hypercholesterolemia.  4.  Past history of pulmonary embolism.  5.  Hypertension.  6.  Reflux disease.  7.  Diverticulosis.  8.  History of urinary tract infection.  9.  Osteoporosis.  10. Postmenopausal.   PLAN:  The patient is admitted to [facility] to undergo a left  total knee arthroplasty.  Surgery will be performed by Dr. Gaynelle Arabian.      Alexzandrew L. Dara Lords, P.A.      Gaynelle Arabian, M.D.  Electronically Signed    ALP/MEDQ  D:  04/30/2005  T:  05/01/2005  Job:  [id number]   cc:   William A. Andria Frames, M.D.  Fax: [phone]

## 2010-06-29 NOTE — Procedures (Signed)
. [facility]  Patient:    Sara Russell, Sara Russell Visit Number: [id number] MRN: [id number]          Service Type: END Location: ENDO Attending Physician:  Vincent Peyer Dictated by:   Lowella Bandy. Olevia Perches, M.D. [facility] Proc. Date: 01/28/01 Admit Date:  01/28/2001   CC:         William A. Andria Frames, M.D.   Procedure Report  PROCEDURE; Upper endoscopy, esophageal dilatation, and colonoscopy.  ENDOSCOPIST: Lowella Bandy. Olevia Perches, M.D.  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  She has had periodic dilatation of the stricture with complete relief of her dysphagia. She has been on Coumadin because of pulmonary embolism in June 2002.  The Coumadin was discontinued five days prior to the current procedures.  Her stool was Hemoccult positive last year.  Her brother was just diagnosed with esophageal CA.  She has had some progressive solid food dysphagia and for that reason she is undergoing repeat upper endoscopy and dilatation.  She is also undergoing follow-up colonoscopy.  Last colonoscopy was done in December 1998.  ENDOSCOPE: Fujinon single-channel video scope.  SEDATION: Versed 10 mg IV, Fentanyl 75 mcg IV.  FINDINGS: The Fujinon single-channel video endoscope was passed under the chin and posterior pharynx into the esophagus.  The patient was monitored by pulse oximetry.  Oxygen saturation prior to sedation was 98% and after sedation 95-96%.  Proximal and distal esophageal mucosa were normal.  There was no esophagitis.  At the level of 35 cm from the incisors was a clean appearing fibrotic stricture measuring about 13-14 mm in diameter.  The endoscope traversed through the stricture without resistance and there was no bleeding from the stricture as the endoscope traversed through.  Stomach.  The stomach was insufflated with air and showed normal  appearing gastric folds and gastric mucosa.  CLOtest was taken from the gastric antrum because of a history of H. pylori.  The pyloric outlet itself was normal and retroflexion of the endoscope revealed normal fundus and cardia.  Duodenum.  The duodenal bulb and descending duodenum were normal.  A guide wire was then placed into the stomach and several dilators passed over the guide wire using 14, 15, and 16 mm dilators.  There was some blood on the last dilator.  The patient tolerated the procedure well.  IMPRESSION:  1. Benign distal esophageal stricture, status post dilatation to a 29 [place].  2. Status post CLOtest.  COLONOSCOPY:  ENDOSCOPE: Fujinon single-channel video scope.  SEDATION: Additional Fentanyl 25 mcg IV.  FINDINGS: The Fujinon single-channel video endoscope was passed under direct vision through the rectum to the sigmoid colon.  The patient was again monitored by pulse oximetry and oxygen saturations were satisfactory.  The prep was satisfactory.  The anal canal showed fibrosed hemorrhoids internally. No history of bleeding.  There were scattered diverticula through the sigmoid colon, which did not appear to be narrow.  The mucosa of the sigmoid colon was normal.  The colonoscope was advanced through the descending colon to the splenic flexure, which appeared normal.  The transverse colon and hepatic flexure appeared normal in the right colon all the way to the cecum.  The cecal pouch and ileocecal valve were examined thoroughly and showed normal mucosa.  There was no stool in the cecum.  The terminal ileum was not examined.  The colonoscope was then retracted and the colon decompressed.  The  patient tolerated the procedure well.  IMPRESSION: Mild diverticulosis of the left colon.  No other abnormalities.  PLAN:  1. Repeat colonoscopy in five to seven years.  2. Continue on Prilosec 20 mg q.d.  2. Question whether the patient should continue Fosamax because of  the     esophageal abnormality.  She may try but if she has adverse side effects     from Fosamax it may have to be discontinued.  We will see her again for     repeat esophageal dilatation if she develops recurrent dysphagia. Dictated by:   Lowella Bandy. Olevia Perches, M.D. [facility] Attending Physician:  Vincent Peyer DD:  01/28/01 TD:  01/28/01 Job: [id number]

## 2010-07-03 ENCOUNTER — Ambulatory Visit: Payer: PRIVATE HEALTH INSURANCE | Admitting: Internal Medicine

## 2010-07-03 ENCOUNTER — Other Ambulatory Visit: Payer: Self-pay | Admitting: Internal Medicine

## 2010-07-03 MED ORDER — BUDESONIDE 3 MG PO CP24: ORAL_CAPSULE | ORAL | Status: DC

## 2010-07-03 NOTE — Telephone Encounter (Signed)
Rx refilled for the pt.

## 2010-07-03 NOTE — Telephone Encounter (Signed)
Addended by: Rosanne Sack R. on: 07/03/2010 10:48 AM   Modules accepted: Orders

## 2010-08-13 ENCOUNTER — Other Ambulatory Visit: Payer: Self-pay | Admitting: Family Medicine

## 2010-08-13 MED ORDER — HYDROCHLOROTHIAZIDE 12.5 MG PO TABS: 12.5000 mg | ORAL_TABLET | Freq: Every day | ORAL | Status: DC

## 2010-08-28 ENCOUNTER — Ambulatory Visit (INDEPENDENT_AMBULATORY_CARE_PROVIDER_SITE_OTHER): Payer: Medicare Other | Admitting: Family Medicine

## 2010-08-28 ENCOUNTER — Encounter: Payer: Self-pay | Admitting: Family Medicine

## 2010-08-28 DIAGNOSIS — R300 Dysuria: Secondary | ICD-10-CM

## 2010-08-28 DIAGNOSIS — N3090 Cystitis, unspecified without hematuria: Secondary | ICD-10-CM | POA: Insufficient documentation

## 2010-08-28 DIAGNOSIS — D649 Anemia, unspecified: Secondary | ICD-10-CM

## 2010-08-28 DIAGNOSIS — D500 Iron deficiency anemia secondary to blood loss (chronic): Secondary | ICD-10-CM | POA: Insufficient documentation

## 2010-08-28 LAB — POCT URINALYSIS DIPSTICK
Glucose, UA: NEGATIVE
Ketones, UA: 15
Nitrite, UA: NEGATIVE
Protein, UA: 100
Spec Grav, UA: 1.02
Urobilinogen, UA: 0.2
pH, UA: 7

## 2010-08-28 LAB — POCT UA - MICROSCOPIC ONLY

## 2010-08-28 LAB — POCT HEMOGLOBIN: Hemoglobin: 14.2

## 2010-08-28 MED ORDER — CEPHALEXIN 500 MG PO CAPS: 500.0000 mg | ORAL_CAPSULE | Freq: Three times a day (TID) | ORAL | Status: AC

## 2010-08-28 NOTE — Assessment & Plan Note (Signed)
Hbg at 14, reassurance.  She will discuss with Dr. Andria Frames if she needs any additional follow up on the ovarian cyst.

## 2010-08-28 NOTE — Assessment & Plan Note (Signed)
Keflex 500 mg tid for 7 dats

## 2010-08-28 NOTE — Progress Notes (Signed)
  Subjective:    Patient ID: Sara Russell, female    DOB: 1930-09-11, 77 y.o.   MRN: [id number]  HPI Several days of dysuria, urgency and frequency of urination.  Denies fever, malaise, or nausea.  Completed treated for colitis by GI, was on systemic steroids and had bad diarrhea.  Asking questions about work up last year by Sara Russell and the finding of a simple ovarian cyst, a normal cancer marker and a low hemoglobin that has since been corrected.  She is not sure what to do.  I reviewed his consultation note found in E chart with her, and there were no specific directions. Agreed to do a finger stick Hbg today for reassurance.   She is missing her husband and still experiencing grief.  Back pain is chronic and worsening, increases with activity.   Review of Systems  Constitutional: Negative for fever and chills.  Genitourinary: Positive for dysuria, urgency and frequency. Negative for hematuria.  Musculoskeletal: Positive for back pain.  Psychiatric/Behavioral: Positive for dysphoric mood.       Objective:   Physical Exam  Constitutional: She appears well-developed and well-nourished.  Abdominal: Soft. Bowel sounds are normal.       No CVA tenderness          Assessment & Plan:

## 2010-08-28 NOTE — Patient Instructions (Signed)
Push the water Take 7 days of Keflex Return to see Hensel in 6-8 weeks, unless you have symptoms.

## 2010-10-30 ENCOUNTER — Other Ambulatory Visit: Payer: Self-pay | Admitting: Family Medicine

## 2010-10-30 DIAGNOSIS — E7800 Pure hypercholesterolemia, unspecified: Secondary | ICD-10-CM

## 2010-10-30 DIAGNOSIS — I10 Essential (primary) hypertension: Secondary | ICD-10-CM

## 2010-10-30 MED ORDER — METOPROLOL TARTRATE 25 MG PO TABS: 25.0000 mg | ORAL_TABLET | Freq: Two times a day (BID) | ORAL | Status: DC

## 2010-10-30 MED ORDER — SIMVASTATIN 20 MG PO TABS
20.0000 mg | ORAL_TABLET | Freq: Every day | ORAL | Status: DC
Start: 2010-10-30 — End: 2011-05-15

## 2010-10-30 MED ORDER — AMLODIPINE BESYLATE 10 MG PO TABS
10.0000 mg | ORAL_TABLET | Freq: Every day | ORAL | Status: DC
Start: 2010-10-30 — End: 2011-10-28

## 2010-10-30 NOTE — Assessment & Plan Note (Signed)
Refilled per patient request. 

## 2010-11-12 ENCOUNTER — Ambulatory Visit (INDEPENDENT_AMBULATORY_CARE_PROVIDER_SITE_OTHER): Payer: Medicare Other

## 2010-11-12 DIAGNOSIS — Z23 Encounter for immunization: Secondary | ICD-10-CM

## 2010-11-21 ENCOUNTER — Encounter: Payer: Self-pay | Admitting: Family Medicine

## 2010-11-21 ENCOUNTER — Ambulatory Visit (INDEPENDENT_AMBULATORY_CARE_PROVIDER_SITE_OTHER): Payer: Medicare Other | Admitting: Family Medicine

## 2010-11-21 DIAGNOSIS — T148XXA Other injury of unspecified body region, initial encounter: Secondary | ICD-10-CM

## 2010-11-21 DIAGNOSIS — N83202 Unspecified ovarian cyst, left side: Secondary | ICD-10-CM | POA: Insufficient documentation

## 2010-11-21 DIAGNOSIS — D649 Anemia, unspecified: Secondary | ICD-10-CM

## 2010-11-21 DIAGNOSIS — N83209 Unspecified ovarian cyst, unspecified side: Secondary | ICD-10-CM

## 2010-11-21 DIAGNOSIS — I10 Essential (primary) hypertension: Secondary | ICD-10-CM

## 2010-11-21 DIAGNOSIS — M5137 Other intervertebral disc degeneration, lumbosacral region: Secondary | ICD-10-CM

## 2010-11-21 DIAGNOSIS — R1909 Other intra-abdominal and pelvic swelling, mass and lump: Secondary | ICD-10-CM | POA: Insufficient documentation

## 2010-11-21 MED ORDER — TRAMADOL HCL 50 MG PO TABS: 50.0000 mg | ORAL_TABLET | Freq: Four times a day (QID) | ORAL | Status: DC | PRN

## 2010-11-21 NOTE — Patient Instructions (Signed)
I will call with blood work results. Schedule your annual wellness visit with Vinnie Level

## 2010-11-22 ENCOUNTER — Encounter: Payer: Self-pay | Admitting: Family Medicine

## 2010-11-22 LAB — BASIC METABOLIC PANEL WITH GFR
BUN: 13 mg/dL (ref 6–23)
CO2: 24 mEq/L (ref 19–32)
Calcium: 9.6 mg/dL (ref 8.4–10.5)
Chloride: 92 mEq/L — ABNORMAL LOW (ref 96–112)
Creat: 0.92 mg/dL (ref 0.50–1.10)
GFR, Est African American: >60 mL/min (ref >60)
GFR, Est Non African American: 59 mL/min — ABNORMAL LOW (ref >60)
Glucose, Bld: 92 mg/dL (ref 70–99)
Potassium: 4 mEq/L (ref 3.5–5.3)
Sodium: 130 mEq/L — ABNORMAL LOW (ref 135–145)

## 2010-11-22 LAB — CA 125: CA 125: 8.5 U/mL (ref 0.0–30.2)

## 2010-11-22 LAB — CBC
HCT: 36.7 % (ref 36.0–46.0)
Hemoglobin: 12.7 g/dL (ref 12.0–15.0)
MCH: 31.4 pg (ref 26.0–34.0)
MCHC: 34.6 g/dL (ref 30.0–36.0)
MCV: 90.8 fL (ref 78.0–100.0)
Platelets: 298 10*3/uL (ref 150–400)
RBC: 4.04 MIL/uL (ref 3.87–5.11)
RDW: 12.7 % (ref 11.5–15.5)
WBC: 8.7 10*3/uL (ref 4.0–10.5)

## 2010-11-22 NOTE — Assessment & Plan Note (Signed)
Gets period ultrasounds of complex ovarian cyst.  Will recheck CA 125

## 2010-11-22 NOTE — Assessment & Plan Note (Signed)
Check platelet count

## 2010-11-22 NOTE — Assessment & Plan Note (Signed)
Check cbc 

## 2010-11-22 NOTE — Progress Notes (Signed)
  Subjective:    Patient ID: Sara Russell, female    DOB: 27-Aug-1930, 77 y.o.   MRN: [id number]  HPI Not annual exam, recheck of problems Recovered from colitis last  Spring without apparent sequelli Has easy bruising.  No frank bleeding. Wt is up.  She has not been exercising.  Instead she has been caring for others. Grief is as expected.  She has very supportive family and neighbors.  Recently had 1 year anniversay of her husband's death. No abd pain, following ovarian cyst.    Review of Systems Denies CP, SOB, leg swelling, bleeding     Objective:   Physical Exam Petechia and bruising of arms Lungs clear Cardiac RRR without mumur Abd benign.       Assessment & Plan:

## 2010-11-22 NOTE — Assessment & Plan Note (Signed)
Not at goal but will focus on better diet and exercise.

## 2010-11-26 ENCOUNTER — Ambulatory Visit (INDEPENDENT_AMBULATORY_CARE_PROVIDER_SITE_OTHER): Payer: Medicare Other | Admitting: Home Health Services

## 2010-11-26 ENCOUNTER — Encounter: Payer: Self-pay | Admitting: Home Health Services

## 2010-11-26 VITALS — BP 118/68 | HR 64 | Temp 97.8°F | Ht 62.5 in | Wt 184.1 lb

## 2010-11-26 DIAGNOSIS — Z0000 Encounter for general adult medical examination without abnormal findings: Secondary | ICD-10-CM

## 2010-11-26 NOTE — Patient Instructions (Signed)
1. Continue to maintain your preventative screenings. 2. Consider ways to include movement into your daily life. 3. Focus on eating 3-4 vegetables day.  4. Continue reading and writing for memory exercises.

## 2010-11-26 NOTE — Progress Notes (Signed)
  Subjective:    Patient ID: Sara Russell, female    DOB: 20-Jun-1930, 77 y.o.   MRN: [id number]  HPI I have reviewed this visit and discussed with Lamont Dowdy and agree with her documentation.       Review of Systems     Objective:   Physical Exam        Assessment & Plan:

## 2010-11-26 NOTE — Progress Notes (Signed)
Patient here for annual wellness visit, patient reports: Risk Factors/Conditions needing evaluation or treatment: Pt does not have any risk factors that need evaluation. Home Safety: Pt lives by self in split level home.  Pt reports having smoke detectors and a walk-in shower. Other Information: Corrective lens: Pt wears corrective lens for reading and visits eye doctor annually. Dentures: Pt does not have dentures and visits dentist annually. Memory: Pt report some memory loss with remembering names. Patient's Mini Mental Score (recorded in doc. flowsheet): 30  Balance/Gait: Pt has had several surgeries on left ankle and does not have full range of movement.  Balance Abnormal Patient value  Sitting balance    Sit to stand x Uses arms  Attempts to arise    Immediate standing balance    Standing balance    Nudge    Eyes closed- Romberg    Tandem stance x  unable  Back lean    Neck Rotation    360 degree turn    Sitting down     Gait Abnormal Patient value  Initiation of gait    Step length-left    Step length-right    Step height-left    Step height-right    Step symmetry    Step continuity    Path deviation    Trunk movement x Little stiff  Walking stance        Annual Wellness Visit Requirements Recorded Today In  Medical, family, social history Past Medical, Family, Social History Section  Current providers Care team  Current medications Medications  Wt, BP, Ht, BMI Vital signs  Hearing assessment (welcome visit) Hearing/vision  Tobacco, alcohol, illicit drug use History  ADL Nurse Assessment  Depression Screening Nurse Assessment  Cognitive impairment Nurse Assessment  Mini Mental Status Document Flowsheet  Fall Risk Nurse Assessment  Home Safety Progress Note  End of Life Planning (welcome visit) Social Documentation  Medicare preventative services Progress Note  Risk factors/conditions needing evaluation/treatment Progress Note  Personalized health advice  Patient Instructions, goals, letter  Diet & Exercise Social Documentation  Emergency Contact Social Documentation  Seat Belts Social Documentation  Sun exposure/protection Social Documentation    Prevention Plan:   Recommended Medicare Prevention Screenings Women over 65 Test For Frequency Date of Last- BOLD if needed  Breast Cancer 1-2 yrs 3/11; 02/2010  Cervical Cancer 1-3 yrs Not indicated  Colorectal Cancer 1-10 yrs 12/07; 3/12 flex  Osteoporosis once 10/10  Cholesterol 5 yrs 3/12  Diabetes yearly Non diabetic  HIV yearly declined  Influenza Shot yearly 10/11; 10/12  Pneumonia Shot once 1/02  Zostavax Shot once 3/08

## 2010-12-16 ENCOUNTER — Encounter (HOSPITAL_COMMUNITY): Payer: Self-pay | Admitting: Emergency Medicine

## 2010-12-16 ENCOUNTER — Emergency Department (HOSPITAL_COMMUNITY)
Admission: EM | Admit: 2010-12-16 | Discharge: 2010-12-16 | Disposition: A | Discharge status: Home / Self Care | Payer: Medicare Other | Attending: Emergency Medicine | Admitting: Emergency Medicine

## 2010-12-16 ENCOUNTER — Emergency Department (HOSPITAL_COMMUNITY): Payer: Medicare Other

## 2010-12-16 DIAGNOSIS — S2231XA Fracture of one rib, right side, initial encounter for closed fracture: Secondary | ICD-10-CM

## 2010-12-16 DIAGNOSIS — S2239XA Fracture of one rib, unspecified side, initial encounter for closed fracture: Secondary | ICD-10-CM | POA: Insufficient documentation

## 2010-12-16 DIAGNOSIS — R079 Chest pain, unspecified: Secondary | ICD-10-CM | POA: Insufficient documentation

## 2010-12-16 DIAGNOSIS — W108XXA Fall (on) (from) other stairs and steps, initial encounter: Secondary | ICD-10-CM | POA: Insufficient documentation

## 2010-12-16 DIAGNOSIS — Z79899 Other long term (current) drug therapy: Secondary | ICD-10-CM | POA: Insufficient documentation

## 2010-12-16 DIAGNOSIS — I10 Essential (primary) hypertension: Secondary | ICD-10-CM | POA: Insufficient documentation

## 2010-12-16 DIAGNOSIS — Z86711 Personal history of pulmonary embolism: Secondary | ICD-10-CM | POA: Insufficient documentation

## 2010-12-16 DIAGNOSIS — Z86718 Personal history of other venous thrombosis and embolism: Secondary | ICD-10-CM | POA: Insufficient documentation

## 2010-12-16 DIAGNOSIS — E785 Hyperlipidemia, unspecified: Secondary | ICD-10-CM | POA: Insufficient documentation

## 2010-12-16 DIAGNOSIS — K219 Gastro-esophageal reflux disease without esophagitis: Secondary | ICD-10-CM | POA: Insufficient documentation

## 2010-12-16 DIAGNOSIS — M129 Arthropathy, unspecified: Secondary | ICD-10-CM | POA: Insufficient documentation

## 2010-12-16 MED ORDER — OXYCODONE-ACETAMINOPHEN 5-325 MG PO TABS: 1.0000 | ORAL_TABLET | ORAL | Status: AC | PRN

## 2010-12-16 NOTE — ED Provider Notes (Signed)
Medical screening examination/treatment/procedure(s) were conducted as a shared visit with non-physician practitioner(s) and myself.  I personally evaluated the patient during the encounter 77 year old female fell and struck the right side of her chest.  Has a single minimally displaced right rib fracture.  No respiratory distress and no pneumothorax.  No other system beer, injuries.  We will release her with analgesics.  She understands the need to to take deep inspirations to prevent atelectasis and infection.   History of Present Illness   Patient Identification Sara Russell is a 77 y.o. female.  Patient information was obtained from patient. History/Exam limitations: none. Patient presented to the Emergency Department ambulatory.  Chief Complaint  Fall   Patient is here for evaluation after a fall. Patient reportedly was descending the stairs outside her home when she slipped and fell on some leaves, falling about 3 feet. The accident occurred 12 hours ago. It is reported that the patient did not strike her head or suffer LOC; she was ambulatory on scene. At this time she complains of pain in the R ribs and pain with inspiration.  Patient denies headache, visual change, abdominal pain, hematuria, flank pain, nausea, vomiting, numbness, tingling, inability to ambulate, upper extremity injury and lower extremity injury. Symptoms are exacerbated by inspiration. Pre-hospital information: The patient has tried tramadol for her symptoms with moderate relief.   Past Medical History  Diagnosis Date  . S/P dilatation of esophageal stricture 2002  . Diverticulosis   . Hypertension   . Hyperlipidemia   . DVT (deep venous thrombosis) 2009  . PE (pulmonary embolism) 2009  . GERD (gastroesophageal reflux disease)   . Diverticulosis   . Anemia   . Hyperplastic colon polyp   . Positive H. pylori test 01/20/96  . Arthritis   . Cancer    Family History  Problem Relation Age of Onset  .  Esophageal cancer Brother   . Diverticulitis Brother   . Colon cancer Neg Hx   . Heart disease Mother   . Stroke Mother    No current facility-administered medications for this encounter.   Current Outpatient Prescriptions  Medication Sig Dispense Refill  . amLODipine (NORVASC) 10 MG tablet Take 1 tablet (10 mg total) by mouth daily.  90 tablet  3  . benazepril (LOTENSIN) 40 MG tablet Take 1 tablet (40 mg total) by mouth daily.  90 tablet  3  . calcium carbonate (TUMS - DOSED IN MG ELEMENTAL CALCIUM) 500 MG chewable tablet Chew 1 tablet by mouth 2 (two) times daily.        . calcium-vitamin D (SM CALCIUM-VITAMIN D) 500-200 MG-UNIT per tablet Take 1 tablet by mouth 2 (two) times daily.       . fluocinonide-emollient (LIDEX-E) 0.05 % cream Apply 1 application topically daily. After showering.      . Glucosamine Sulfate 500 MG TABS Take 500 mg by mouth 2 (two) times daily.        . hydrochlorothiazide (HYDRODIURIL) 12.5 MG tablet Take 1 tablet (12.5 mg total) by mouth daily.  90 tablet  3  . metoprolol tartrate (LOPRESSOR) 25 MG tablet Take 1 tablet (25 mg total) by mouth 2 (two) times daily.  180 tablet  3  . metroNIDAZOLE (METROGEL) 1 % gel Apply 1 application topically every other day. Apply a small amount to affected area at bedtime.  Disp 60 gm.      . mometasone (ELOCON) 0.1 % cream Apply 1 application topically every other day.       Marland Kitchen  omeprazole (PRILOSEC) 40 MG capsule Take 1 capsule (40 mg total) by mouth daily.  30 capsule  11  . simvastatin (ZOCOR) 20 MG tablet Take 1 tablet (20 mg total) by mouth daily.  90 tablet  3  . oxyCODONE-acetaminophen (PERCOCET) 5-325 MG per tablet Take 1-2 tablets by mouth every 4 (four) hours as needed for pain.  20 tablet  0  . DISCONTD: omeprazole (PRILOSEC OTC) 20 MG tablet Take 20 mg by mouth 2 (two) times daily.         Allergies  Allergen Reactions  . Iohexol Other (See Comments)    unknown  . Ivp Dye (Iodinated Diagnostic Agents) Hives  .  Scallops (Shellfish Allergy) Other (See Comments)    Projectile vomiting.  . Sulfa Antibiotics Hives  . Sulfamethoxazole Hives    REACTION: unspecified   History   Social History  . Marital Status: Widowed    Spouse Name: N/A    Number of Children: 3  . Years of Education: college   Occupational History  . retired Marine scientist    Social History Main Topics  . Smoking status: Former Smoker    Quit date: 02/11/1961  . Smokeless tobacco: Never Used  . Alcohol Use: 0.0 oz/week    7-14 Glasses of wine per week  . Drug Use: No  . Sexually Active: Not on file   Other Topics Concern  . Not on file   Social History Narrative   Health Care POA: Emergency Contact: son, Tinzlee Craker (c) [phone] of Life Plan: Who lives with you: selfAny pets: noneDiet: Pt has a varied diet of protein, starch, vegetables.Exercise: Pt has no regular exercise plan because of pain in back.Seatbelts: Pt reports wearing seatbelt when in vehicle.Nancy Fetter Exposure/Protection: Pt reports wearing sun screen.Hobbies: reading, traveling, eating out.   Review of Systems A comprehensive review of systems was negative except for: Musculoskeletal: positive for myalgias   Physical Exam   BP 153/85  Pulse 80  Temp(Src) 97.3 F (36.3 C) (Oral)  Resp 20  SpO2 98%  General appearance: alert, cooperative, appears stated age and no distress Head: Normocephalic, without obvious abnormality, atraumatic Eyes: conjunctivae/corneas clear. PERRL, EOM's intact. Fundi benign. Neck: no adenopathy, supple, symmetrical, trachea midline and thyroid not enlarged, symmetric, no tenderness/mass/nodules Back: symmetric, no curvature. ROM normal. No CVA tenderness. Lungs: clear to auscultation bilaterally Breasts: normal appearance, no masses or tenderness, Inspection negative Heart: regular rate and rhythm, S1, S2 normal, no murmur, click, rub or gallop Abdomen: soft, non-tender; bowel sounds normal; no masses,  no  organomegaly Tenderness under R breast at about the level of 5th-6th rib. No visible erythema/ecchymoses/deformity. No palp crepitus.  ED Course   Studies: Imaging: X-ray: Fracture of R 6th rib, minimally displaced. No PTX.  Disposition: Home, Analgesics Patient with minimally displaced 6th rib fx, has no other complaints other than worsening pain with inspiration. She will be d/ced to home with analgesics. She was instructed to breathe deeply to avoid atelectasis. Dr. Vanessa Kick also saw and assessed the pt and agreed with plan. Patient verbalized understanding and agreed to plan.  Abran Richard, [place] 12/16/10 1718

## 2010-12-16 NOTE — ED Notes (Signed)
Pt. Stated, I fell last night and hurt my rt. Rib area sometimes it's hard to breath

## 2010-12-19 NOTE — ED Provider Notes (Signed)
Medical screening examination/treatment/procedure(s) were conducted as a shared visit with non-physician practitioner(s) and myself.  I personally evaluated the patient during the encounter  Elmer Picker, MD 12/19/10 1544

## 2011-02-15 ENCOUNTER — Telehealth: Payer: Self-pay | Admitting: Family Medicine

## 2011-02-15 DIAGNOSIS — R1909 Other intra-abdominal and pelvic swelling, mass and lump: Secondary | ICD-10-CM

## 2011-02-15 NOTE — Telephone Encounter (Signed)
Will forward to Dr. Andria Frames to have orders put in.Audelia Hives [place]

## 2011-02-15 NOTE — Telephone Encounter (Signed)
Sara Russell is needing order to have her Pelvic Ultrasound and C125.  She would need to go after 1/10th.

## 2011-02-18 NOTE — Telephone Encounter (Signed)
Please thank Ms Grennan for keeping track of these dates and tell her the orders have been entered.

## 2011-02-18 NOTE — Assessment & Plan Note (Signed)
Please see ultrasound report of 02/19/10

## 2011-02-19 NOTE — Telephone Encounter (Signed)
Called and informed pt that she has an appt at [address] at 130 pm she will need to have a full bladder. Pt understood and agreed.Marland KitchenMarland KitchenAudelia Hives [place]

## 2011-02-20 ENCOUNTER — Ambulatory Visit
Admission: RE | Admit: 2011-02-20 | Discharge: 2011-02-20 | Disposition: A | Discharge status: Home / Self Care | Payer: BC Managed Care – PPO | Source: Ambulatory Visit | Attending: Family Medicine | Admitting: Family Medicine

## 2011-02-20 ENCOUNTER — Ambulatory Visit
Admission: RE | Admit: 2011-02-20 | Discharge: 2011-02-20 | Disposition: A | Discharge status: Home / Self Care | Payer: Medicare Other | Source: Ambulatory Visit | Attending: Family Medicine | Admitting: Family Medicine

## 2011-02-20 DIAGNOSIS — N9489 Other specified conditions associated with female genital organs and menstrual cycle: Secondary | ICD-10-CM | POA: Diagnosis not present

## 2011-02-20 DIAGNOSIS — R1909 Other intra-abdominal and pelvic swelling, mass and lump: Secondary | ICD-10-CM

## 2011-02-21 ENCOUNTER — Other Ambulatory Visit: Payer: Medicare Other

## 2011-02-21 DIAGNOSIS — R1909 Other intra-abdominal and pelvic swelling, mass and lump: Secondary | ICD-10-CM | POA: Diagnosis not present

## 2011-02-21 LAB — CA 125: CA 125: 6 U/mL (ref 0.0–30.2)

## 2011-02-21 NOTE — Progress Notes (Signed)
CA-125 DONE TODAY Sara Russell

## 2011-03-12 ENCOUNTER — Other Ambulatory Visit: Payer: Self-pay | Admitting: Family Medicine

## 2011-03-13 ENCOUNTER — Telehealth: Payer: Self-pay | Admitting: Family Medicine

## 2011-03-13 DIAGNOSIS — Z1239 Encounter for other screening for malignant neoplasm of breast: Secondary | ICD-10-CM

## 2011-03-13 NOTE — Telephone Encounter (Signed)
Dr. Andria Frames, Can you put in the referral for this. I didn't see anything in there or remember anything about this ------[name]

## 2011-03-13 NOTE — Telephone Encounter (Signed)
Pt is needing to get a diagnostic mammogram and our office is supposed to make the appt since it is not routine.

## 2011-03-13 NOTE — Telephone Encounter (Signed)
Called and LM.  I need to understand why she needs diagnostic rather than screening mammogram.  Per my records, her last mammo was 03/06/10, nl and recommend 1 year follow up.

## 2011-03-14 DIAGNOSIS — Z1239 Encounter for other screening for malignant neoplasm of breast: Secondary | ICD-10-CM | POA: Insufficient documentation

## 2011-03-14 NOTE — Assessment & Plan Note (Signed)
Discussed.  She attempted to set up annual digital screening mammogram.  No current symptoms.  Was told by tech that she required diagnostic mammo - which I don't think is correct.  I ordered digital screening with the understanding that I would approve additional studies if needed.

## 2011-03-19 DIAGNOSIS — L219 Seborrheic dermatitis, unspecified: Secondary | ICD-10-CM | POA: Diagnosis not present

## 2011-03-19 DIAGNOSIS — L821 Other seborrheic keratosis: Secondary | ICD-10-CM | POA: Diagnosis not present

## 2011-03-19 DIAGNOSIS — Z85828 Personal history of other malignant neoplasm of skin: Secondary | ICD-10-CM | POA: Diagnosis not present

## 2011-03-19 DIAGNOSIS — L570 Actinic keratosis: Secondary | ICD-10-CM | POA: Diagnosis not present

## 2011-04-10 ENCOUNTER — Other Ambulatory Visit: Payer: Self-pay | Admitting: Family Medicine

## 2011-04-10 DIAGNOSIS — E7800 Pure hypercholesterolemia, unspecified: Secondary | ICD-10-CM

## 2011-04-18 ENCOUNTER — Ambulatory Visit
Admission: RE | Admit: 2011-04-18 | Discharge: 2011-04-18 | Disposition: A | Discharge status: Home / Self Care | Payer: Self-pay | Source: Ambulatory Visit | Attending: Family Medicine | Admitting: Family Medicine

## 2011-04-18 DIAGNOSIS — Z1231 Encounter for screening mammogram for malignant neoplasm of breast: Secondary | ICD-10-CM | POA: Diagnosis not present

## 2011-04-18 DIAGNOSIS — Z1239 Encounter for other screening for malignant neoplasm of breast: Secondary | ICD-10-CM

## 2011-05-14 ENCOUNTER — Other Ambulatory Visit: Payer: PRIVATE HEALTH INSURANCE

## 2011-05-14 DIAGNOSIS — E7800 Pure hypercholesterolemia, unspecified: Secondary | ICD-10-CM | POA: Diagnosis not present

## 2011-05-14 LAB — LIPID PANEL
Cholesterol: 187 mg/dL (ref 0–200)
HDL: 87 mg/dL (ref >39)
LDL Cholesterol: 69 mg/dL (ref 0–99)
Total CHOL/HDL Ratio: 2.1 Ratio
Triglycerides: 154 mg/dL — ABNORMAL HIGH (ref <150)
VLDL: 31 mg/dL (ref 0–40)

## 2011-05-14 LAB — COMPLETE METABOLIC PANEL WITH GFR
ALT: 19 U/L (ref 0–35)
AST: 27 U/L (ref 0–37)
Albumin: 4.3 g/dL (ref 3.5–5.2)
Alkaline Phosphatase: 61 U/L (ref 39–117)
BUN: 12 mg/dL (ref 6–23)
CO2: 26 mEq/L (ref 19–32)
Calcium: 9.4 mg/dL (ref 8.4–10.5)
Chloride: 94 mEq/L — ABNORMAL LOW (ref 96–112)
Creat: 1.03 mg/dL (ref 0.50–1.10)
GFR, Est African American: 60 mL/min
GFR, Est Non African American: 52 mL/min — ABNORMAL LOW
Glucose, Bld: 109 mg/dL — ABNORMAL HIGH (ref 70–99)
Potassium: 3.8 mEq/L (ref 3.5–5.3)
Sodium: 131 mEq/L — ABNORMAL LOW (ref 135–145)
Total Bilirubin: 0.5 mg/dL (ref 0.3–1.2)
Total Protein: 6.8 g/dL (ref 6.0–8.3)

## 2011-05-14 NOTE — Progress Notes (Signed)
cmp and flp done today [facility]

## 2011-05-15 ENCOUNTER — Ambulatory Visit (INDEPENDENT_AMBULATORY_CARE_PROVIDER_SITE_OTHER): Payer: PRIVATE HEALTH INSURANCE | Admitting: Family Medicine

## 2011-05-15 ENCOUNTER — Encounter: Payer: Self-pay | Admitting: Family Medicine

## 2011-05-15 VITALS — BP 151/75 | HR 77 | Temp 97.7°F | Ht 62.5 in | Wt 179.6 lb

## 2011-05-15 DIAGNOSIS — M899 Disorder of bone, unspecified: Secondary | ICD-10-CM

## 2011-05-15 DIAGNOSIS — I10 Essential (primary) hypertension: Secondary | ICD-10-CM

## 2011-05-15 DIAGNOSIS — E7800 Pure hypercholesterolemia, unspecified: Secondary | ICD-10-CM

## 2011-05-15 MED ORDER — SIMVASTATIN 20 MG PO TABS: 20.0000 mg | ORAL_TABLET | Freq: Every day | ORAL | Status: DC

## 2011-05-15 MED ORDER — OMEPRAZOLE 40 MG PO CPDR: 40.0000 mg | DELAYED_RELEASE_CAPSULE | Freq: Every day | ORAL | Status: DC

## 2011-05-15 NOTE — Patient Instructions (Signed)
I will call you with the bone density results Keep an eye on your blood pressure. You are healthy - the weight and increasing exercise would be the two things you could do better.

## 2011-05-17 ENCOUNTER — Telehealth: Payer: Self-pay | Admitting: Family Medicine

## 2011-05-17 NOTE — Assessment & Plan Note (Signed)
Results of recent labs indicate she is at goal

## 2011-05-17 NOTE — Assessment & Plan Note (Signed)
Was on fosamax.  Stopped several years ago when GI bleed.  Does have GERD.  Will recheck BMD and consider iv therapy if worse.

## 2011-05-17 NOTE — Assessment & Plan Note (Signed)
Home BPs quite good.  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

## 2011-05-17 NOTE — Progress Notes (Signed)
  Subjective:    Patient ID: Sara Russell, female    DOB: 1930-04-13, 78 y.o.   MRN: [id number]  HPI Patient doing very well.  Remains active and engaged in life despite her husband's death.  She is tolerating meds without side effects.  Reviewed labs done prior to her visit - all acceptable    Review of Systems     Objective:   Physical Exam Lungs clear Cardiac RRR       Assessment & Plan:

## 2011-05-17 NOTE — Telephone Encounter (Signed)
Appointment set up for Tuesday 4/16 @ 9:30am, patient to arrive @ 9:15am. Breast center, patient notified and given appointment.

## 2011-05-17 NOTE — Telephone Encounter (Signed)
Patient is calling because she hasn't heard anything about the Bone Density test that Dr. Andria Frames was ordering.  She said the only time she cannot be scheduled is for Thursday 4/11.

## 2011-05-20 ENCOUNTER — Other Ambulatory Visit: Payer: Self-pay | Admitting: Family Medicine

## 2011-05-20 DIAGNOSIS — M1990 Unspecified osteoarthritis, unspecified site: Secondary | ICD-10-CM

## 2011-05-20 DIAGNOSIS — M159 Polyosteoarthritis, unspecified: Secondary | ICD-10-CM

## 2011-05-20 DIAGNOSIS — M8580 Other specified disorders of bone density and structure, unspecified site: Secondary | ICD-10-CM

## 2011-05-28 ENCOUNTER — Ambulatory Visit
Admission: RE | Admit: 2011-05-28 | Discharge: 2011-05-28 | Disposition: A | Discharge status: Home / Self Care | Payer: Medicare Other | Source: Ambulatory Visit | Attending: Family Medicine | Admitting: Family Medicine

## 2011-05-28 ENCOUNTER — Inpatient Hospital Stay: Admission: RE | Admit: 2011-05-28 | Payer: PRIVATE HEALTH INSURANCE | Source: Ambulatory Visit

## 2011-05-28 DIAGNOSIS — M1990 Unspecified osteoarthritis, unspecified site: Secondary | ICD-10-CM

## 2011-05-28 DIAGNOSIS — M899 Disorder of bone, unspecified: Secondary | ICD-10-CM | POA: Diagnosis not present

## 2011-05-28 DIAGNOSIS — Z780 Asymptomatic menopausal state: Secondary | ICD-10-CM | POA: Diagnosis not present

## 2011-05-28 DIAGNOSIS — M949 Disorder of cartilage, unspecified: Secondary | ICD-10-CM | POA: Diagnosis not present

## 2011-05-28 DIAGNOSIS — M8580 Other specified disorders of bone density and structure, unspecified site: Secondary | ICD-10-CM

## 2011-05-30 ENCOUNTER — Other Ambulatory Visit: Payer: Self-pay | Admitting: Family Medicine

## 2011-05-30 ENCOUNTER — Encounter: Payer: Self-pay | Admitting: Family Medicine

## 2011-05-30 DIAGNOSIS — I10 Essential (primary) hypertension: Secondary | ICD-10-CM

## 2011-05-30 MED ORDER — BENAZEPRIL HCL 40 MG PO TABS: 40.0000 mg | ORAL_TABLET | Freq: Every day | ORAL | Status: DC

## 2011-05-30 MED ORDER — HYDROCHLOROTHIAZIDE 12.5 MG PO TABS: 12.5000 mg | ORAL_TABLET | Freq: Every day | ORAL | Status: DC

## 2011-05-30 NOTE — Assessment & Plan Note (Signed)
Refilled meds based on written request from patient.

## 2011-08-06 DIAGNOSIS — H25019 Cortical age-related cataract, unspecified eye: Secondary | ICD-10-CM | POA: Diagnosis not present

## 2011-10-28 ENCOUNTER — Other Ambulatory Visit: Payer: Self-pay | Admitting: Family Medicine

## 2011-11-26 ENCOUNTER — Ambulatory Visit (INDEPENDENT_AMBULATORY_CARE_PROVIDER_SITE_OTHER): Payer: Medicare Other | Admitting: *Deleted

## 2011-11-26 DIAGNOSIS — Z23 Encounter for immunization: Secondary | ICD-10-CM | POA: Diagnosis not present

## 2011-12-04 ENCOUNTER — Encounter: Payer: Self-pay | Admitting: Home Health Services

## 2012-01-31 ENCOUNTER — Other Ambulatory Visit: Payer: Self-pay | Admitting: Family Medicine

## 2012-02-18 ENCOUNTER — Telehealth: Payer: Self-pay | Admitting: Family Medicine

## 2012-02-18 NOTE — Telephone Encounter (Signed)
Needs orders for a pelvic US for her ovarian cyst - she does this yearly

## 2012-02-21 ENCOUNTER — Other Ambulatory Visit: Payer: Self-pay | Admitting: *Deleted

## 2012-02-21 DIAGNOSIS — R1900 Intra-abdominal and pelvic swelling, mass and lump, unspecified site: Secondary | ICD-10-CM

## 2012-02-21 NOTE — Telephone Encounter (Signed)
Called pt and lvm for her to call back for best day and time to set her appt.Sara Russell [place]

## 2012-02-24 NOTE — Telephone Encounter (Signed)
[place] scheduled for patient at GI on 02/26/2012 at 1:30. Patient says she is unable to keep that appointment and is going to call and reschedule.Busick, Kevin Fenton

## 2012-02-26 ENCOUNTER — Other Ambulatory Visit: Payer: Medicare Other

## 2012-02-27 ENCOUNTER — Ambulatory Visit
Admission: RE | Admit: 2012-02-27 | Discharge: 2012-02-27 | Disposition: A | Discharge status: Home / Self Care | Payer: Medicare Other | Source: Ambulatory Visit | Attending: Family Medicine | Admitting: Family Medicine

## 2012-02-27 DIAGNOSIS — R1900 Intra-abdominal and pelvic swelling, mass and lump, unspecified site: Secondary | ICD-10-CM

## 2012-02-27 DIAGNOSIS — N838 Other noninflammatory disorders of ovary, fallopian tube and broad ligament: Secondary | ICD-10-CM | POA: Diagnosis not present

## 2012-03-17 DIAGNOSIS — L819 Disorder of pigmentation, unspecified: Secondary | ICD-10-CM | POA: Diagnosis not present

## 2012-03-17 DIAGNOSIS — D2370 Other benign neoplasm of skin of unspecified lower limb, including hip: Secondary | ICD-10-CM | POA: Diagnosis not present

## 2012-03-17 DIAGNOSIS — L570 Actinic keratosis: Secondary | ICD-10-CM | POA: Diagnosis not present

## 2012-03-17 DIAGNOSIS — D239 Other benign neoplasm of skin, unspecified: Secondary | ICD-10-CM | POA: Diagnosis not present

## 2012-03-17 DIAGNOSIS — L723 Sebaceous cyst: Secondary | ICD-10-CM | POA: Diagnosis not present

## 2012-03-17 DIAGNOSIS — Z85828 Personal history of other malignant neoplasm of skin: Secondary | ICD-10-CM | POA: Diagnosis not present

## 2012-03-17 DIAGNOSIS — L821 Other seborrheic keratosis: Secondary | ICD-10-CM | POA: Diagnosis not present

## 2012-03-31 ENCOUNTER — Other Ambulatory Visit: Payer: Self-pay | Admitting: Family Medicine

## 2012-03-31 DIAGNOSIS — Z1231 Encounter for screening mammogram for malignant neoplasm of breast: Secondary | ICD-10-CM

## 2012-04-06 ENCOUNTER — Ambulatory Visit: Payer: Medicare Other | Admitting: *Deleted

## 2012-04-08 ENCOUNTER — Ambulatory Visit: Payer: Medicare Other | Admitting: *Deleted

## 2012-04-08 ENCOUNTER — Encounter: Payer: Self-pay | Admitting: *Deleted

## 2012-04-08 NOTE — Progress Notes (Unsigned)
  Well dressed and pleasant 79 yo women in clinic for annual wellness visit. Pt does not report any risk factors that need evaluation.  Home safety: She lives alone in split level home. Pt reports smoke detectors. Other: Denies problems with vision, wears reading glasses. Does not wear dentures and visits dentist annually. Denies memory problems except for delay in remembering names occasionally. Balance/gait: past surgeries on left ankle. Able to stand from seated without use of arms. Balance appears WNL. Full ROM in neck. Gait realzed without difficulty, left ankle turns out 45 degree angle. Reports that she had a fall just over 1 yr ago at friends house walking down stairs and trying to avoid object on stair. Medical, family and social hx documented in past medical, family, social history sections. Current providers updated in care team Current medication documented in medications Vital signs recorded in vital signs Tobacco, alcohol and drug use in history ADL, depression screening, cognitive impairment and fall risk documented in nursing history Prevention Plan: pt states she is advocate for prevention and provided list of upcoming appointments with PCP, dermatologist and dentist. Reviewed list of recommended medicare prevention screenings for women over age 56. Is currently up to date on all screenings and immunizations.

## 2012-05-04 ENCOUNTER — Ambulatory Visit
Admission: RE | Admit: 2012-05-04 | Discharge: 2012-05-04 | Disposition: A | Discharge status: Home / Self Care | Payer: Medicare Other | Source: Ambulatory Visit | Attending: Family Medicine | Admitting: Family Medicine

## 2012-05-04 DIAGNOSIS — Z1231 Encounter for screening mammogram for malignant neoplasm of breast: Secondary | ICD-10-CM | POA: Diagnosis not present

## 2012-05-15 ENCOUNTER — Encounter: Payer: Self-pay | Admitting: Family Medicine

## 2012-05-15 ENCOUNTER — Ambulatory Visit (INDEPENDENT_AMBULATORY_CARE_PROVIDER_SITE_OTHER): Payer: Medicare Other | Admitting: Family Medicine

## 2012-05-15 VITALS — BP 135/61 | HR 80 | Temp 98.0°F | Ht 62.5 in | Wt 176.2 lb

## 2012-05-15 DIAGNOSIS — L719 Rosacea, unspecified: Secondary | ICD-10-CM | POA: Diagnosis not present

## 2012-05-15 DIAGNOSIS — D649 Anemia, unspecified: Secondary | ICD-10-CM | POA: Diagnosis not present

## 2012-05-15 DIAGNOSIS — E7800 Pure hypercholesterolemia, unspecified: Secondary | ICD-10-CM | POA: Diagnosis not present

## 2012-05-15 DIAGNOSIS — I10 Essential (primary) hypertension: Secondary | ICD-10-CM | POA: Diagnosis not present

## 2012-05-15 DIAGNOSIS — K219 Gastro-esophageal reflux disease without esophagitis: Secondary | ICD-10-CM | POA: Diagnosis not present

## 2012-05-15 MED ORDER — BENAZEPRIL HCL 40 MG PO TABS: 40.0000 mg | ORAL_TABLET | Freq: Every day | ORAL | Status: DC

## 2012-05-15 MED ORDER — HYDROCHLOROTHIAZIDE 12.5 MG PO TABS: 12.5000 mg | ORAL_TABLET | Freq: Every day | ORAL | Status: DC

## 2012-05-15 MED ORDER — OMEPRAZOLE 40 MG PO CPDR: 40.0000 mg | DELAYED_RELEASE_CAPSULE | Freq: Every day | ORAL | Status: DC

## 2012-05-15 MED ORDER — AMLODIPINE BESYLATE 10 MG PO TABS: 10.0000 mg | ORAL_TABLET | Freq: Every day | ORAL | Status: DC

## 2012-05-15 MED ORDER — METRONIDAZOLE 1 % EX GEL: 1.0000 "application " | CUTANEOUS | Status: DC

## 2012-05-15 MED ORDER — METOPROLOL TARTRATE 25 MG PO TABS: 25.0000 mg | ORAL_TABLET | Freq: Two times a day (BID) | ORAL | Status: DC

## 2012-05-15 MED ORDER — SIMVASTATIN 20 MG PO TABS: 20.0000 mg | ORAL_TABLET | Freq: Every day | ORAL | Status: DC

## 2012-05-15 NOTE — Patient Instructions (Addendum)
You seem to be doing great. You are great about staying on top of your health care needs. Get the fasting blood work on Monday.  I'll call with results. Stay healthy and stay in touch.   I'll see i=you in 6-12 months if things are going well.

## 2012-05-15 NOTE — Assessment & Plan Note (Signed)
Check - has hx of slow GI blood loss.

## 2012-05-15 NOTE — Progress Notes (Signed)
  Subjective:    Patient ID: Sara Russell, female    DOB: November 13, 1930, 79 y.o.   MRN: [id number]  HPI  Doing very well.  Will turn 79 in July.  Adjusting to living alone.  Very compliant with meds.  Needs refills.  Does not want any changes.   Exercise is limited due to ankle pain. Eats a healthy diet and has been slowly losing wt - largely because she does not do cooking for one. Up to date on health maint.    Review of Systems Denies CP, SOB, Bleeding, bowel/bladder changes.       Objective:   Physical Exam Neck supple without masses Lungs clear Cardiac RRR without m or g Abd benign. Ext no edema.       Assessment & Plan:

## 2012-05-15 NOTE — Assessment & Plan Note (Signed)
Stable  On metrogel

## 2012-05-15 NOTE — Assessment & Plan Note (Signed)
Good control

## 2012-05-15 NOTE — Assessment & Plan Note (Signed)
Check labs 

## 2012-05-18 ENCOUNTER — Other Ambulatory Visit: Payer: Medicare Other

## 2012-05-18 DIAGNOSIS — E7800 Pure hypercholesterolemia, unspecified: Secondary | ICD-10-CM | POA: Diagnosis not present

## 2012-05-18 DIAGNOSIS — D649 Anemia, unspecified: Secondary | ICD-10-CM

## 2012-05-18 LAB — COMPLETE METABOLIC PANEL WITH GFR
ALT: 13 U/L (ref 0–35)
AST: 19 U/L (ref 0–37)
Albumin: 4.2 g/dL (ref 3.5–5.2)
Alkaline Phosphatase: 48 U/L (ref 39–117)
BUN: 16 mg/dL (ref 6–23)
CO2: 25 mEq/L (ref 19–32)
Calcium: 9.5 mg/dL (ref 8.4–10.5)
Chloride: 98 mEq/L (ref 96–112)
Creat: 0.99 mg/dL (ref 0.50–1.10)
GFR, Est African American: 63 mL/min
GFR, Est Non African American: 54 mL/min — ABNORMAL LOW
Glucose, Bld: 100 mg/dL — ABNORMAL HIGH (ref 70–99)
Potassium: 3.7 mEq/L (ref 3.5–5.3)
Sodium: 133 mEq/L — ABNORMAL LOW (ref 135–145)
Total Bilirubin: 0.4 mg/dL (ref 0.3–1.2)
Total Protein: 7 g/dL (ref 6.0–8.3)

## 2012-05-18 LAB — CBC
HCT: 26.8 % — ABNORMAL LOW (ref 36.0–46.0)
Hemoglobin: 7.9 g/dL — ABNORMAL LOW (ref 12.0–15.0)
MCH: 20.9 pg — ABNORMAL LOW (ref 26.0–34.0)
MCHC: 29.5 g/dL — ABNORMAL LOW (ref 30.0–36.0)
MCV: 70.9 fL — ABNORMAL LOW (ref 78.0–100.0)
Platelets: 313 10*3/uL (ref 150–400)
RBC: 3.78 MIL/uL — ABNORMAL LOW (ref 3.87–5.11)
RDW: 16.9 % — ABNORMAL HIGH (ref 11.5–15.5)
WBC: 6.3 10*3/uL (ref 4.0–10.5)

## 2012-05-18 LAB — LIPID PANEL
Cholesterol: 195 mg/dL (ref 0–200)
HDL: 82 mg/dL (ref >39)
LDL Cholesterol: 86 mg/dL (ref 0–99)
Total CHOL/HDL Ratio: 2.4 Ratio
Triglycerides: 136 mg/dL (ref <150)
VLDL: 27 mg/dL (ref 0–40)

## 2012-05-18 NOTE — Progress Notes (Signed)
CMP,FLP AND CBC DONE TODAY Bray Vickerman

## 2012-05-19 ENCOUNTER — Encounter: Payer: Self-pay | Admitting: Family Medicine

## 2012-05-19 NOTE — Progress Notes (Signed)
Patient ID: Sara Russell, female   DOB: [birth year], 79 y.o.   MRN: [id number] Informed of low hemoglobin.  Start back on iron.  Occult GI loss.

## 2012-06-26 ENCOUNTER — Other Ambulatory Visit: Payer: Self-pay | Admitting: Family Medicine

## 2012-06-26 ENCOUNTER — Other Ambulatory Visit: Payer: Medicare Other

## 2012-06-26 DIAGNOSIS — D649 Anemia, unspecified: Secondary | ICD-10-CM | POA: Diagnosis not present

## 2012-06-26 LAB — FERRITIN: Ferritin: 49 ng/mL (ref 10–291)

## 2012-06-26 NOTE — Progress Notes (Signed)
CBC AND FERRITIN DONE TODAY Sara Russell

## 2012-06-27 LAB — CBC
HCT: 33.8 % — ABNORMAL LOW (ref 36.0–46.0)
Hemoglobin: 11.1 g/dL — ABNORMAL LOW (ref 12.0–15.0)
MCH: 26.6 pg (ref 26.0–34.0)
MCHC: 32.8 g/dL (ref 30.0–36.0)
MCV: 80.9 fL (ref 78.0–100.0)
Platelets: 331 10*3/uL (ref 150–400)
RBC: 4.18 MIL/uL (ref 3.87–5.11)
WBC: 8.1 10*3/uL (ref 4.0–10.5)

## 2012-06-29 ENCOUNTER — Encounter: Payer: Self-pay | Admitting: Family Medicine

## 2012-08-06 DIAGNOSIS — H25019 Cortical age-related cataract, unspecified eye: Secondary | ICD-10-CM | POA: Diagnosis not present

## 2012-09-11 ENCOUNTER — Other Ambulatory Visit: Payer: Medicare Other

## 2012-09-11 ENCOUNTER — Other Ambulatory Visit: Payer: Self-pay | Admitting: Family Medicine

## 2012-09-11 DIAGNOSIS — D649 Anemia, unspecified: Secondary | ICD-10-CM

## 2012-09-11 LAB — CBC
HCT: 35.2 % — ABNORMAL LOW (ref 36.0–46.0)
Hemoglobin: 12.3 g/dL (ref 12.0–15.0)
MCH: 30.6 pg (ref 26.0–34.0)
MCHC: 34.9 g/dL (ref 30.0–36.0)
MCV: 87.6 fL (ref 78.0–100.0)
Platelets: 377 10*3/uL (ref 150–400)
RBC: 4.02 MIL/uL (ref 3.87–5.11)
RDW: 13.3 % (ref 11.5–15.5)
WBC: 10.7 10*3/uL — ABNORMAL HIGH (ref 4.0–10.5)

## 2012-09-11 NOTE — Assessment & Plan Note (Signed)
Check labs 

## 2012-09-11 NOTE — Progress Notes (Signed)
We drew CBC. Sara Russell, Sara Russell

## 2012-10-08 ENCOUNTER — Telehealth: Payer: Self-pay | Admitting: Family Medicine

## 2012-10-08 NOTE — Telephone Encounter (Signed)
See note below - tramadol is not listed on [facility] Tildon Husky, RN-BSN

## 2012-10-08 NOTE — Telephone Encounter (Signed)
The patient has been waiting on a refill on Tramadol since Sunday.  [facility] on [address] was to send a refill request but with the change in the level for Tramadol, there was a mix up and add to that, Dr. Andria Frames being on vacation has really cause flubup.  So she needs her medication and would really like for [place] to help that happen because she isn't able to take anything else.

## 2012-10-09 MED ORDER — TRAMADOL HCL 50 MG PO TABS: ORAL_TABLET | ORAL | Status: DC

## 2012-10-09 NOTE — Telephone Encounter (Signed)
To my knowledge there was no refill request made - phone or fax - I do not know of the "flupup"  myself but the prescription has been called in and pt informed med should be ready. Tildon Husky, RN-BSN

## 2012-10-09 NOTE — Telephone Encounter (Signed)
Elizabeth OK---I have seen NOTHING from any pharmacy amd I have been checking his box daily--so I do not know about the flubup.Please call in the refill AND also lether know that she will have to get w Dr Lemmie Evens for further refills and advice as the schedule of this med is changing. Please apologize to her for the inconveneinece--I am not sure what happened. Dorcas Mcmurray

## 2012-10-13 ENCOUNTER — Telehealth: Payer: Self-pay | Admitting: Family Medicine

## 2012-10-13 NOTE — Telephone Encounter (Signed)
Patient is calling returning call to Dr. Andria Frames.

## 2012-10-13 NOTE — Telephone Encounter (Signed)
She has her tramadol and does not need anything at this moment.

## 2012-11-24 ENCOUNTER — Ambulatory Visit (INDEPENDENT_AMBULATORY_CARE_PROVIDER_SITE_OTHER): Payer: Medicare Other | Admitting: *Deleted

## 2012-11-24 DIAGNOSIS — Z23 Encounter for immunization: Secondary | ICD-10-CM

## 2012-11-27 ENCOUNTER — Telehealth: Payer: Self-pay | Admitting: Family Medicine

## 2012-11-27 NOTE — Telephone Encounter (Signed)
Pt is requesting a refill on tramadol be sent in to her pharmacy on file. JW

## 2012-11-30 ENCOUNTER — Telehealth: Payer: Self-pay | Admitting: Family Medicine

## 2012-11-30 MED ORDER — TRAMADOL HCL 50 MG PO TABS: ORAL_TABLET | ORAL | Status: DC

## 2012-11-30 NOTE — Telephone Encounter (Signed)
Pt called to check the status on her tramadol. Jw

## 2012-12-01 NOTE — Telephone Encounter (Signed)
LVM informing that rx was sent in yesterday

## 2012-12-04 ENCOUNTER — Ambulatory Visit (INDEPENDENT_AMBULATORY_CARE_PROVIDER_SITE_OTHER): Payer: Medicare Other | Admitting: Internal Medicine

## 2012-12-04 ENCOUNTER — Encounter: Payer: Self-pay | Admitting: Internal Medicine

## 2012-12-04 VITALS — BP 120/68 | HR 88 | Ht 62.0 in | Wt 171.4 lb

## 2012-12-04 DIAGNOSIS — D509 Iron deficiency anemia, unspecified: Secondary | ICD-10-CM

## 2012-12-04 DIAGNOSIS — K922 Gastrointestinal hemorrhage, unspecified: Secondary | ICD-10-CM | POA: Diagnosis not present

## 2012-12-04 DIAGNOSIS — R933 Abnormal findings on diagnostic imaging of other parts of digestive tract: Secondary | ICD-10-CM

## 2012-12-04 NOTE — Progress Notes (Signed)
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 She has a lymphocytic colitis diagnosed on flexible sigmoidoscopy in April 2012. She responded to course of Entocort. She is having soft frequent stools and little gas for which she takes Gas-X. She had a upper endoscopy as well as enteroscopy in 2011 to look for the abnormality in the jejunum but the lesion could not be identified on enteroscopy. She has known 3 cm hiatal hernia and gastric polyps. Last colonoscopy in December 2007 showed mild diverticulosis of the left colon and hyperplastic polyp. Flexible sigmoidoscopy in 2012 for severe watery diarrhea confirmed lymphocytic colitis. she has had intermittent severe iron deficiency anemia which usually responds to iron. Most recent hemoglobin in August 2014 was 12.3 hematocrit 35.2. She takes daily iron supplements. Her stools have been black but there has been no bright red blood. Her general level of energy has been low   Past Medical History  Diagnosis Date  . S/P dilatation of esophageal stricture 2002  . Diverticulosis   . Hypertension   . Hyperlipidemia   . DVT (deep venous thrombosis) 2009  . PE (pulmonary embolism) 2009  . GERD (gastroesophageal reflux disease)   . Diverticulosis   . Anemia   . Hyperplastic colon polyp   . Positive H. pylori test 01/20/96  . Arthritis   . Cancer    Past Surgical History  Procedure Laterality Date  . Dilation and curettage of uterus      for endometrial polyps  . Replacement total knee      left  . Tonsillectomy    . Appendectomy    . Breast lumpectomy      benign    reports that she quit smoking about 51 years ago. She has never used smokeless tobacco. She reports that she drinks alcohol. She  reports that she does not use illicit drugs. family history includes Diverticulitis in her brother; Esophageal cancer in her brother; Heart disease in her mother; Stroke in her mother. There is no history of Colon cancer. Allergies  Allergen Reactions  . Iohexol Other (See Comments)    unknown  . Ivp Dye [Iodinated Diagnostic Agents] Hives  . Scallops [Shellfish Allergy] Other (See Comments)    Projectile vomiting.  . Sulfa Antibiotics Hives  . Sulfamethoxazole Hives    REACTION: unspecified        Review of Systems: Negative for dysphagia heartburn, she initially lost weight after death of her husband but has maintained recently  The remainder of the 10 point ROS is negative except as outlined in H&P   Physical Exam: General appearance  Well developed, in no distress. Eyes- non icteric. HEENT nontraumatic, normocephalic. Mouth no lesions, tongue papillated, no cheilosis. Neck supple without adenopathy, thyroid not enlarged, no carotid bruits, no JVD. Lungs Clear to auscultation bilaterally. Cor normal S1, normal S2, regular rhythm, no murmur,  quiet precordium. Abdomen: Soft nontender with normoactive bowel sounds. No tenderness or mass Rectal: Black Hemoccult negative stool Extremities no pedal edema. Deformity of the left ankle  from prior surgery Skin no lesions. Neurological alert and oriented x 3. Psychological normal mood and affect.  Assessment and Plan:  79 year old white female with chronic low-grade GI blood loss from a lesion in  proximal jejunum, polyp, vs nodule vs benign tumor.. We have discussed further  evaluation of the small bowel abnormality with either double balloon enteroscopy at [facility] or at another small bowel capsule endoscopy. She is not interested in any evaluation as long as so her hemoglobin stays  normal. I wonder if she would benefit from iron infusion from time to time. It appears that her hemoglobin tends to drop suddenly even despite  of  taking iron supplements. She will have her blood count rechecked in December and will have also her iron levels rechecked. At that point if there are low she may benefit from iron infusion  Lymphocytic colitis. Currently under reasonable control. The patient is not interested in steroids he is Entocort or prednisone. Gas-X seems to help she will call us if the her symptoms become worse.   12/04/2012 [name]

## 2012-12-04 NOTE — Patient Instructions (Signed)
Dr Andria Frames

## 2012-12-07 ENCOUNTER — Ambulatory Visit (HOSPITAL_COMMUNITY)
Admission: RE | Admit: 2012-12-07 | Discharge: 2012-12-07 | Disposition: A | Discharge status: Home / Self Care | Payer: Medicare Other | Source: Ambulatory Visit | Attending: Family Medicine | Admitting: Family Medicine

## 2012-12-07 ENCOUNTER — Ambulatory Visit (INDEPENDENT_AMBULATORY_CARE_PROVIDER_SITE_OTHER): Payer: Medicare Other | Admitting: Family Medicine

## 2012-12-07 VITALS — BP 125/83 | HR 83 | Temp 99.5°F | Ht 62.0 in | Wt 169.0 lb

## 2012-12-07 DIAGNOSIS — R9431 Abnormal electrocardiogram [ECG] [EKG]: Secondary | ICD-10-CM | POA: Insufficient documentation

## 2012-12-07 DIAGNOSIS — I4891 Unspecified atrial fibrillation: Secondary | ICD-10-CM

## 2012-12-07 DIAGNOSIS — R0789 Other chest pain: Secondary | ICD-10-CM | POA: Insufficient documentation

## 2012-12-07 DIAGNOSIS — I10 Essential (primary) hypertension: Secondary | ICD-10-CM

## 2012-12-07 MED ORDER — METOPROLOL TARTRATE 50 MG PO TABS
50.0000 mg | ORAL_TABLET | Freq: Two times a day (BID) | ORAL | Status: DC
Start: 2012-12-07 — End: 2012-12-30

## 2012-12-07 NOTE — Patient Instructions (Addendum)
You have Atrial Fibrillation. We have changed your medications as follow: Please discontinue Amlodipine Increase metoprolol 50 mg twice a day. Followup in 48-72 hours. If you develop any shortness of breath, chest pain, palpitations, or any other symptoms please get evaluated right away.

## 2012-12-08 DIAGNOSIS — I4891 Unspecified atrial fibrillation: Secondary | ICD-10-CM | POA: Insufficient documentation

## 2012-12-08 NOTE — Progress Notes (Signed)
Family Medicine Office Visit Note   Subjective:   Patient ID: Sara Russell, female  DOB: 04/20/1930, 79 y.o.. MRN: [id number]   Pt that comes today for follow up. She reports last Friday at GI office visit she was told that her heart was irregular and was recommended to f/u with primary doctor. Pt was asymptomatic then and has continued to be asymptomatic. She has PMHx significant for chronic GI bleed attributable to small intestine polyp that has been f/u by Dr. Olevia Perches, she also has severe Iron def anemia that is frequently monitored and hs stabilized with iron replacement. She has also hx of DVT and PE but not on anticoagulation per discussed above.   Review of Systems:  Pt denies SOB, chest pain, palpitations, headaches, dizziness, numbness or weakness. No changes on urinary or BM habits.   Objective:   Physical Exam: Gen:  NAD HEENT: Moist mucous membranes  CV: Irregularly irregular, no murmurs rubs or gallops PULM: Clear to auscultation bilaterally. No wheezes/rales/rhonchi ABD: Soft, non tender, non distended, normal bowel sounds EXT: No edema Neuro: Alert and oriented x3. No focalization  Assessment & Plan:

## 2012-12-08 NOTE — Assessment & Plan Note (Addendum)
EKG consistent with Afib. New onset. Asymptomatic. Discussed with attending Dr. Erin Hearing. Metoprolol increased to 50 mg BID. since her BP has been in the 120/80 will stop amlodipine and monitor.  Discussed in depth with pt risks and benefits of anticoagulation therapy for this new condition. Due to her PMHx pt refusses start anticoagulation.  F/u in 48 to 72h. Discussed signs of worsening condition that should prompt re-evaluation.

## 2012-12-10 ENCOUNTER — Ambulatory Visit (HOSPITAL_COMMUNITY)
Admission: RE | Admit: 2012-12-10 | Discharge: 2012-12-10 | Disposition: A | Discharge status: Home / Self Care | Payer: Medicare Other | Source: Ambulatory Visit | Attending: Family Medicine | Admitting: Family Medicine

## 2012-12-10 ENCOUNTER — Encounter: Payer: Self-pay | Admitting: Family Medicine

## 2012-12-10 ENCOUNTER — Ambulatory Visit (INDEPENDENT_AMBULATORY_CARE_PROVIDER_SITE_OTHER): Payer: Medicare Other | Admitting: Family Medicine

## 2012-12-10 VITALS — BP 115/83 | HR 92 | Temp 98.1°F | Ht 62.0 in | Wt 170.4 lb

## 2012-12-10 DIAGNOSIS — I4891 Unspecified atrial fibrillation: Secondary | ICD-10-CM | POA: Diagnosis not present

## 2012-12-10 DIAGNOSIS — R9431 Abnormal electrocardiogram [ECG] [EKG]: Secondary | ICD-10-CM | POA: Diagnosis not present

## 2012-12-10 LAB — BASIC METABOLIC PANEL
BUN: 10 mg/dL (ref 6–23)
CO2: 27 mEq/L (ref 19–32)
Calcium: 9.1 mg/dL (ref 8.4–10.5)
Chloride: 95 mEq/L — ABNORMAL LOW (ref 96–112)
Creat: 0.86 mg/dL (ref 0.50–1.10)
Glucose, Bld: 112 mg/dL — ABNORMAL HIGH (ref 70–99)
Potassium: 3.9 mEq/L (ref 3.5–5.3)
Sodium: 131 mEq/L — ABNORMAL LOW (ref 135–145)

## 2012-12-10 LAB — CBC
HCT: 37.2 % (ref 36.0–46.0)
Hemoglobin: 12.7 g/dL (ref 12.0–15.0)
MCH: 30.3 pg (ref 26.0–34.0)
MCHC: 34.1 g/dL (ref 30.0–36.0)
MCV: 88.8 fL (ref 78.0–100.0)
Platelets: 329 10*3/uL (ref 150–400)
RBC: 4.19 MIL/uL (ref 3.87–5.11)
RDW: 13.5 % (ref 11.5–15.5)
WBC: 9 10*3/uL (ref 4.0–10.5)

## 2012-12-10 LAB — TSH: TSH: 1.943 u[IU]/mL (ref 0.350–4.500)

## 2012-12-10 NOTE — Patient Instructions (Signed)
It has been a pleasure to see you today. Please take the medications as prescribed. I will call you with the labs results if they come back abnormal otherwise you will receive a letter. You will have two appointments scheduled, one for an ECHO and the second one with Cardiology. Please make a f/u appointment here in [facility] in a month or sooner if needed.

## 2012-12-10 NOTE — Assessment & Plan Note (Addendum)
Rate controled on metoprolol 50 BID. Pt continues to refuse anticoagulation therapy.  TSH, CBC and BMet drawn today. ECHO ordered Cardiology consult placed. F/u with [place] in one months or sooner if needed.

## 2012-12-10 NOTE — Progress Notes (Signed)
Family Medicine Office Visit Note   Subjective:   Patient ID: Sara Russell, female  DOB: 11/10/1930, 79 y.o.. MRN: [id number]   Pt that comes today for followup recent diagnosis of A. Fib. Her dose of metoprolol has been increased to 50 twice a day her heart rate has been controled to 70-80 bpm per pt's report. She is a retired Engineer, civil (consulting) and she states has checked her pulse with stethoscope for 1 min duration.   Pt denies any noticeable side effect of medication and has no current complaints.  Review of Systems:  Pt denies SOB, chest pain, palpitations, headaches, dizziness, numbness or weakness.  Objective:   Physical Exam: Gen:  NAD HEENT: Moist mucous membranes  CV: irregularly irregular, no murmurs rubs or gallops PULM: Clear to auscultation bilaterally. No wheezes/rales/rhonchi ABD: Soft, non tender, non distended, normal bowel sounds EXT: No edema Neuro: Alert and oriented x3. No focalization  Assessment & Plan:

## 2012-12-11 ENCOUNTER — Encounter: Payer: Self-pay | Admitting: Family Medicine

## 2012-12-14 ENCOUNTER — Telehealth: Payer: Self-pay | Admitting: Family Medicine

## 2012-12-29 ENCOUNTER — Other Ambulatory Visit: Payer: Self-pay | Admitting: Family Medicine

## 2012-12-29 ENCOUNTER — Other Ambulatory Visit: Payer: Self-pay

## 2012-12-29 ENCOUNTER — Ambulatory Visit (HOSPITAL_COMMUNITY)
Admission: RE | Admit: 2012-12-29 | Discharge: 2012-12-29 | Disposition: A | Discharge status: Home / Self Care | Payer: Medicare Other | Source: Ambulatory Visit | Attending: Family Medicine | Admitting: Family Medicine

## 2012-12-29 ENCOUNTER — Telehealth: Payer: Self-pay | Admitting: Family Medicine

## 2012-12-29 DIAGNOSIS — I4891 Unspecified atrial fibrillation: Secondary | ICD-10-CM | POA: Insufficient documentation

## 2012-12-29 DIAGNOSIS — I369 Nonrheumatic tricuspid valve disorder, unspecified: Secondary | ICD-10-CM | POA: Diagnosis not present

## 2012-12-29 DIAGNOSIS — Z8249 Family history of ischemic heart disease and other diseases of the circulatory system: Secondary | ICD-10-CM | POA: Diagnosis not present

## 2012-12-29 NOTE — Telephone Encounter (Signed)
After EEG and echo, pt has not heard anything. When contacting pt tomorrow, please call her at [phone]

## 2012-12-29 NOTE — Progress Notes (Signed)
  Echocardiogram 2D Echocardiogram has been performed.  Sara Russell 12/29/2012, 11:03 AM

## 2012-12-30 MED ORDER — METOPROLOL TARTRATE 100 MG PO TABS: 100.0000 mg | ORAL_TABLET | Freq: Two times a day (BID) | ORAL | Status: DC

## 2012-12-30 NOTE — Assessment & Plan Note (Signed)
Patient in a fib.  HR on EKG yesterday not controled.  She takes BP at home and systolic is 035-597.  Will increase metoprolol.  Anticoag is difficult.  She has had a recurrent slow GI bleed and she is now off ASA (Before A Fib, was on ASA for Primary prevention of MI).  Even bled on ASA 81 mg  Echo shows dilated Left Atrium.  Agree with cards referral.  Is she a candidate for conversion?  What to do about anticoag.  I will appreciate the advice.

## 2013-01-08 ENCOUNTER — Ambulatory Visit (INDEPENDENT_AMBULATORY_CARE_PROVIDER_SITE_OTHER): Payer: Medicare Other | Admitting: Internal Medicine

## 2013-01-08 ENCOUNTER — Encounter: Payer: Self-pay | Admitting: Internal Medicine

## 2013-01-08 VITALS — BP 120/76 | HR 90 | Ht 62.0 in | Wt 168.8 lb

## 2013-01-08 DIAGNOSIS — I517 Cardiomegaly: Secondary | ICD-10-CM

## 2013-01-08 DIAGNOSIS — I4891 Unspecified atrial fibrillation: Secondary | ICD-10-CM

## 2013-01-08 DIAGNOSIS — I10 Essential (primary) hypertension: Secondary | ICD-10-CM | POA: Diagnosis not present

## 2013-01-08 MED ORDER — APIXABAN 5 MG PO TABS: 5.0000 mg | ORAL_TABLET | Freq: Two times a day (BID) | ORAL | Status: DC

## 2013-01-08 NOTE — Progress Notes (Signed)
Primary Care Physician: Zigmund Gottron, MD Referring Physician:  Dr Ladon Applebaum Sara Russell is a 79 y.o. female with a h/o hypertension, obesity, and persistent atrial fibrillation who presents today for EP consultation regarding her afib.  She was found to have an irregular heart rhythm on an annual check-up by [name] MD in October 12/04/12.  She was feeling well at the time.  She reports having some fatigue which she felt was due to iron deficiency.  She presented to Dr Mike Craze office 12/07/12 and was found to have afib.  She was unaware of her afib.  She is mostly limited by arthritis. Today, she denies symptoms of palpitations, chest pain, shortness of breath, orthopnea, PND, lower extremity edema, dizziness, presyncope, syncope, or neurologic sequela. The patient is tolerating medications without difficulties and is otherwise without complaint today.   Past Medical History  Diagnosis Date  . S/P dilatation of esophageal stricture 2002  . Hypertension   . Hyperlipidemia   . DVT (deep venous thrombosis) 2009  . PE (pulmonary embolism) 2009  . GERD (gastroesophageal reflux disease)   . Diverticulosis   . Iron deficiency anemia   . Hyperplastic colon polyp   . Positive H. pylori test 01/20/96  . Degenerative joint disease   . Skin cancer     h/o basal and squamous skin cancer--> removed  . Persistent atrial fibrillation    Past Surgical History  Procedure Laterality Date  . Dilation and curettage of uterus      for endometrial polyps  . Replacement total knee      left  . Tonsillectomy    . Appendectomy    . Breast lumpectomy      benign    Current Outpatient Prescriptions  Medication Sig Dispense Refill  . benazepril (LOTENSIN) 40 MG tablet Take 1 tablet (40 mg total) by mouth daily.  90 tablet  3  . calcium carbonate (TUMS - DOSED IN MG ELEMENTAL CALCIUM) 500 MG chewable tablet Chew 1 tablet by mouth 2 (two) times daily.        . calcium-vitamin D (SM  CALCIUM-VITAMIN D) 500-200 MG-UNIT per tablet Take 1 tablet by mouth 2 (two) times daily.       . ferrous sulfate 325 (65 FE) MG tablet Take 325 mg by mouth 2 (two) times daily with breakfast and lunch.       . hydrochlorothiazide (HYDRODIURIL) 12.5 MG tablet Take 1 tablet (12.5 mg total) by mouth daily. Patient will call for refill when needed  90 tablet  3  . metoprolol (LOPRESSOR) 100 MG tablet Take 1 tablet (100 mg total) by mouth 2 (two) times daily.  180 tablet  3  . metroNIDAZOLE (METROGEL) 1 % gel Apply 1 application topically every other day. Apply a small amount to affected area at bedtime.  Disp 60 gm.  60 g  5  . omeprazole (PRILOSEC) 40 MG capsule Take 1 capsule (40 mg total) by mouth daily. Patient will call for refill when needed  90 capsule  3  . simvastatin (ZOCOR) 20 MG tablet Take 1 tablet (20 mg total) by mouth daily. Patient will call for refill when needed  90 tablet  3  . traMADol (ULTRAM) 50 MG tablet TAKE ONE TABLET BY MOUTH EVERY 6 HOURS AS NEEDED FOR PAIN  60 tablet  5  . apixaban (ELIQUIS) 5 MG TABS tablet Take 1 tablet (5 mg total) by mouth 2 (two) times daily.  60 tablet  0  .  apixaban (ELIQUIS) 5 MG TABS tablet Take 1 tablet (5 mg total) by mouth 2 (two) times daily.  60 tablet  5  . [DISCONTINUED] omeprazole (PRILOSEC OTC) 20 MG tablet Take 20 mg by mouth 2 (two) times daily.         No current facility-administered medications for this visit.    Allergies  Allergen Reactions  . Iohexol Other (See Comments)    unknown  . Ivp Dye [Iodinated Diagnostic Agents] Hives  . Scallops [Shellfish Allergy] Other (See Comments)    Projectile vomiting.  . Sulfa Antibiotics Hives  . Sulfamethoxazole Hives    REACTION: unspecified    History   Social History  . Marital Status: Widowed    Spouse Name: N/A    Number of Children: 3  . Years of Education: college   Occupational History  . retired Marine scientist    Social History Main Topics  . Smoking status: Former Smoker      Quit date: 02/11/1961  . Smokeless tobacco: Never Used  . Alcohol Use: 0.0 oz/week    7-14 Glasses of wine per week     Comment: 1-2 glasses of wine per day  . Drug Use: No  . Sexual Activity: Not on file   Other Topics Concern  . Not on file   Social History Narrative   Health Care POA:    Emergency Contact: son, Cayla Wiegand (c) [phone]   End of Life Plan:    Who lives with you: self   Any pets: none   Diet: Pt has a varied diet of protein, starch, vegetables.   Exercise: Pt has no regular exercise plan because of pain in back.   Seatbelts: Pt reports wearing seatbelt when in vehicle.   Nancy Fetter Exposure/Protection: Pt reports wearing sun screen.   Hobbies: reading, traveling, eating out.      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 Esophageal cancer Brother   . Diverticulitis Brother   . Colon cancer Neg Hx   . Heart disease Mother   . Stroke Mother     ROS- All systems are reviewed and negative except as per the HPI above  Physical Exam: Filed Vitals:   01/08/13 0844  BP: 120/76  Pulse: 90  Height: 5' 2"  (1.575 m)  Weight: 168 lb 12.8 oz (76.567 kg)    GEN- The patient is well appearing, alert and oriented x 3 today.   Head- normocephalic, atraumatic Eyes-  Sclera clear, conjunctiva pink Ears- hearing intact Oropharynx- clear Neck- supple, no JVP Lymph- no cervical lymphadenopathy Lungs- Clear to ausculation bilaterally, normal work of breathing Heart- irregular rate and rhythm, no murmurs, rubs or gallops, PMI not laterally displaced GI- soft, NT, ND, + BS Extremities- no clubbing, cyanosis, or edema MS- no significant deformity or atrophy Skin- no rash or lesion Psych- euthymic mood, full affect Neuro- strength and sensation are intact  EKG today reveals afib, V rate 90 bpm, nonspecific  ST/T changes Echo is reviewed Family Medicine notes are reviewed  Assessment and Plan:  1.  Persistent atrial fibrillation The patient has asymptomatic afib.  She is reasonably rate controlled.  I discussed holter monitoring to assess HR but she would prefer to keep her own log of heart rates.  She is a Marine scientist and feels that she can  do this reliably.   Therapeutic strategies for afib including rate control and rhythm control were discussed in detail with the patient today. Risk, benefits, and alternatives to each strategy were also discussed in detail today.  At this time, she would like to continue a rate control strategy.  I think that this is very reasonable. Her CHADS2VASC score is at least 4.  She has a prior history of GI bleeding and anemia which complicate the picture.  Today, I discussed couamdin novel anticoagulants including pradaxa, xarelto, and eliquis today as indicated for risk reduction in stroke and systemic emboli with nonvalvular atrial fibrillation.  Risks, benefits, and alternatives to each of these drugs were discussed at length today.  I would not recommend pradaxa or xarelto as they have been shown to have GI bleeding risks in excess of coumadin.  I would favor eliquis which does not appear to have increased GI bleeding risks when compared to coumadin. She would like to try eliquis.  I will start eliquis 48m BID today and have her return in 6 weeks for reassessment.  2. LA enlargement I have personally reviewed her echo with Dr NJohnsie Cancelthis am.  She has moderate to severe LA enlargement.  This is likely resultant to prolonged asymptomatic afib.  I did recommend a sleep study to evaluate sleep apnea as a cause for her LA enlargement.  She declines sleep study at this time.  3. HTN Stable No change required today  Return in 6 weeks

## 2013-01-08 NOTE — Patient Instructions (Signed)
Your physician has recommended you make the following change in your medication:  1) Start Eliquis 5 mg twice daily  Your physician recommends that you schedule a follow-up appointment in: 6 weeks with Dr. [name].

## 2013-01-10 DIAGNOSIS — I517 Cardiomegaly: Secondary | ICD-10-CM | POA: Insufficient documentation

## 2013-01-11 ENCOUNTER — Ambulatory Visit: Payer: Medicare Other | Admitting: Internal Medicine

## 2013-01-13 ENCOUNTER — Telehealth: Payer: Self-pay | Admitting: Family Medicine

## 2013-01-13 NOTE — Telephone Encounter (Signed)
After talking with patient she has several issues about her cardiologist /Dr Allred and GI/Dr Olevia Perches.After several minute on the phone I instructed her to schedule an appointment with Dr Andria Frames.she voiced understanding. Sara Russell, [name]

## 2013-01-13 NOTE — Telephone Encounter (Signed)
Pt called and would like Dr. Andria Frames to call her to discuss her options concerning her other doctors that she has been seeing. jw

## 2013-01-20 ENCOUNTER — Encounter: Payer: Self-pay | Admitting: Family Medicine

## 2013-01-20 ENCOUNTER — Ambulatory Visit (INDEPENDENT_AMBULATORY_CARE_PROVIDER_SITE_OTHER): Payer: Medicare Other | Admitting: Family Medicine

## 2013-01-20 VITALS — BP 132/84 | HR 105 | Temp 97.5°F | Ht 62.0 in | Wt 170.8 lb

## 2013-01-20 DIAGNOSIS — I10 Essential (primary) hypertension: Secondary | ICD-10-CM | POA: Diagnosis not present

## 2013-01-20 DIAGNOSIS — J069 Acute upper respiratory infection, unspecified: Secondary | ICD-10-CM

## 2013-01-20 DIAGNOSIS — I4891 Unspecified atrial fibrillation: Secondary | ICD-10-CM

## 2013-01-20 DIAGNOSIS — D649 Anemia, unspecified: Secondary | ICD-10-CM | POA: Diagnosis not present

## 2013-01-20 NOTE — Patient Instructions (Addendum)
Get a blood count done before Christmas.  I have entered the order. Keep an eye on your heart rate.  If it is consistently above 100, we will need to increase your metoprolol.  If it above 90, I may want to increase the metoprolol. Robitussin DM is my favorite cough medicine.  I doubt you need antibiotics.  If you seem to be getting worse I will reconsider.

## 2013-01-21 DIAGNOSIS — J069 Acute upper respiratory infection, unspecified: Secondary | ICD-10-CM | POA: Insufficient documentation

## 2013-01-21 NOTE — Progress Notes (Signed)
   Subjective:    Patient ID: Sara Russell, female    DOB: 18-May-1930, 79 y.o.   MRN: [id number]  HPI  Several issues: HBP is acceptable, borderline high on new med regimen.  Tolerating higher dose of Beta blocker without complaints. Afib: on apixiban with no apparent bleeding.  Seen by Cards and Allred is satisfied.  I am worried that her heart rate is a bit high. Anemia for chronic/recurrent GI bleed.  On iron BID.  Hgb has been stable but the apixiban is a new variable in the equation. URI.  Upper respiratory sx with promenant cough.  States she wheezes when has coughing spell.     Review of Systems     Objective:   Physical Exam Conjuctiva - pink, does not look anemic Throat OK Neck no sig nodes.   Lungs clear, no wheeze.   Cardiac irreg irreg HR=~100       Assessment & Plan:

## 2013-01-21 NOTE — Assessment & Plan Note (Signed)
I could go higher on beta blocker for both BP and HR control.  I am reluctant to do this with current URI and her complaint of wheezing.  She is a Proofreader, Higher education careers adviser and we will do this based on her ongoing evals - see AVS

## 2013-01-21 NOTE — Assessment & Plan Note (Signed)
Seems mild URI.  No wheeze.  Treat with Robitussin DM.

## 2013-01-21 NOTE — Assessment & Plan Note (Signed)
Will follow with monthly hgb for 1st 6 months on apixiban.  She has been on a little over one week.  Will check first in three weeks.

## 2013-01-25 ENCOUNTER — Telehealth: Payer: Self-pay | Admitting: Family Medicine

## 2013-01-25 MED ORDER — AZITHROMYCIN 500 MG PO TABS: 500.0000 mg | ORAL_TABLET | Freq: Every day | ORAL | Status: DC

## 2013-01-25 NOTE — Telephone Encounter (Signed)
Patient states that Dr. Andria Frames advised her that if her symptoms were to worsen that he would prescribe an antibiotic. She states it has gotten extremely worse and would like RX sent to [facility].

## 2013-01-25 NOTE — Telephone Encounter (Signed)
Respiratory symptoms, especially cough, worsening.  Now greater than 7 days.  Will try empiric antibiotics.

## 2013-01-25 NOTE — Telephone Encounter (Signed)
Please advise. Sara Russell, Sara Russell

## 2013-01-29 ENCOUNTER — Other Ambulatory Visit: Payer: Self-pay | Admitting: *Deleted

## 2013-01-29 ENCOUNTER — Other Ambulatory Visit: Payer: Medicare Other

## 2013-01-29 DIAGNOSIS — D649 Anemia, unspecified: Secondary | ICD-10-CM

## 2013-01-29 LAB — CBC
HCT: 40.6 % (ref 36.0–46.0)
Hemoglobin: 14 g/dL (ref 12.0–15.0)
MCH: 30.3 pg (ref 26.0–34.0)
MCHC: 34.5 g/dL (ref 30.0–36.0)
MCV: 87.9 fL (ref 78.0–100.0)
Platelets: 362 10*3/uL (ref 150–400)
RBC: 4.62 MIL/uL (ref 3.87–5.11)
RDW: 13.8 % (ref 11.5–15.5)
WBC: 10.8 10*3/uL — ABNORMAL HIGH (ref 4.0–10.5)

## 2013-01-29 MED ORDER — APIXABAN 5 MG PO TABS: 5.0000 mg | ORAL_TABLET | Freq: Two times a day (BID) | ORAL | Status: DC

## 2013-01-29 NOTE — Progress Notes (Signed)
CBC DONE TODAY Breauna Mazzeo

## 2013-02-01 ENCOUNTER — Telehealth: Payer: Self-pay | Admitting: Family Medicine

## 2013-02-01 NOTE — Telephone Encounter (Signed)
Dear Sara Russell Team Please call and tell her that Her  Cbc looks normal THANKS!Sara Russell

## 2013-02-01 NOTE — Telephone Encounter (Signed)
Wants to know results from [place] blood work Is leaving town tomorrow

## 2013-02-01 NOTE — Telephone Encounter (Signed)
Related message,pt voiced understand. Sara Russell, Sara Russell

## 2013-02-08 ENCOUNTER — Encounter: Payer: Self-pay | Admitting: Family Medicine

## 2013-02-22 ENCOUNTER — Encounter (INDEPENDENT_AMBULATORY_CARE_PROVIDER_SITE_OTHER): Payer: Self-pay

## 2013-02-22 ENCOUNTER — Encounter: Payer: Self-pay | Admitting: Internal Medicine

## 2013-02-22 ENCOUNTER — Telehealth: Payer: Self-pay | Admitting: Family Medicine

## 2013-02-22 ENCOUNTER — Ambulatory Visit (INDEPENDENT_AMBULATORY_CARE_PROVIDER_SITE_OTHER): Payer: Medicare Other | Admitting: Internal Medicine

## 2013-02-22 ENCOUNTER — Other Ambulatory Visit: Payer: Self-pay

## 2013-02-22 VITALS — BP 140/82 | HR 107 | Ht 62.0 in | Wt 164.8 lb

## 2013-02-22 DIAGNOSIS — I4891 Unspecified atrial fibrillation: Secondary | ICD-10-CM

## 2013-02-22 DIAGNOSIS — I10 Essential (primary) hypertension: Secondary | ICD-10-CM

## 2013-02-22 LAB — CBC WITH DIFFERENTIAL/PLATELET
Basophils Absolute: 0 10*3/uL (ref 0.0–0.1)
Basophils Relative: 0.2 % (ref 0.0–3.0)
Eosinophils Absolute: 0.1 10*3/uL (ref 0.0–0.7)
Eosinophils Relative: 1.4 % (ref 0.0–5.0)
HCT: 41.8 % (ref 36.0–46.0)
Hemoglobin: 14.1 g/dL (ref 12.0–15.0)
Lymphocytes Relative: 13 % (ref 12.0–46.0)
Lymphs Abs: 1.3 10*3/uL (ref 0.7–4.0)
MCHC: 33.7 g/dL (ref 30.0–36.0)
MCV: 91.1 fl (ref 78.0–100.0)
Monocytes Absolute: 0.7 10*3/uL (ref 0.1–1.0)
Monocytes Relative: 7.2 % (ref 3.0–12.0)
Neutro Abs: 8 10*3/uL — ABNORMAL HIGH (ref 1.4–7.7)
Neutrophils Relative %: 78.2 % — ABNORMAL HIGH (ref 43.0–77.0)
Platelets: 255 10*3/uL (ref 150.0–400.0)
RBC: 4.59 Mil/uL (ref 3.87–5.11)
RDW: 14 % (ref 11.5–14.6)
WBC: 10.3 10*3/uL (ref 4.5–10.5)

## 2013-02-22 LAB — BASIC METABOLIC PANEL
BUN: 16 mg/dL (ref 6–23)
CO2: 29 mEq/L (ref 19–32)
Calcium: 9.4 mg/dL (ref 8.4–10.5)
Chloride: 96 mEq/L (ref 96–112)
Creatinine, Ser: 1 mg/dL (ref 0.4–1.2)
GFR: 57.96 mL/min — ABNORMAL LOW (ref >60.00)
Glucose, Bld: 103 mg/dL — ABNORMAL HIGH (ref 70–99)
Potassium: 4.2 mEq/L (ref 3.5–5.1)
Sodium: 136 mEq/L (ref 135–145)

## 2013-02-22 MED ORDER — APIXABAN 5 MG PO TABS
5.0000 mg | ORAL_TABLET | Freq: Two times a day (BID) | ORAL | Status: DC
Start: 2013-02-22 — End: 2014-07-21

## 2013-02-22 MED ORDER — DILTIAZEM HCL ER COATED BEADS 120 MG PO CP24: 120.0000 mg | ORAL_CAPSULE | Freq: Every day | ORAL | Status: DC

## 2013-02-22 NOTE — Progress Notes (Signed)
PCP:  Zigmund Gottron, MD  The patient presents today for routine electrophysiology followup.  Since last being seen in our clinic, the patient reports doing reasonably well.  Today, she denies symptoms of palpitations, chest pain, shortness of breath, orthopnea, PND, lower extremity edema, dizziness, presyncope, syncope, or neurologic sequela.  The patient feels that she is tolerating medications without difficulties and is otherwise without complaint today.   Past Medical History  Diagnosis Date  . S/P dilatation of esophageal stricture 2002  . Hypertension   . Hyperlipidemia   . DVT (deep venous thrombosis) 2009  . PE (pulmonary embolism) 2009  . GERD (gastroesophageal reflux disease)   . Diverticulosis   . Iron deficiency anemia   . Hyperplastic colon polyp   . Positive H. pylori test 01/20/96  . Degenerative joint disease   . Skin cancer     h/o basal and squamous skin cancer--> removed  . Persistent atrial fibrillation    Past Surgical History  Procedure Laterality Date  . Dilation and curettage of uterus      for endometrial polyps  . Replacement total knee      left  . Tonsillectomy    . Appendectomy    . Breast lumpectomy      benign    Current Outpatient Prescriptions  Medication Sig Dispense Refill  . apixaban (ELIQUIS) 5 MG TABS tablet Take 1 tablet (5 mg total) by mouth 2 (two) times daily.  60 tablet  0  . azithromycin (ZITHROMAX) 500 MG tablet Take 1 tablet (500 mg total) by mouth daily.  3 tablet  0  . benazepril (LOTENSIN) 40 MG tablet Take 1 tablet (40 mg total) by mouth daily.  90 tablet  3  . calcium carbonate (TUMS - DOSED IN MG ELEMENTAL CALCIUM) 500 MG chewable tablet Chew 1 tablet by mouth 2 (two) times daily.        . calcium-vitamin D (SM CALCIUM-VITAMIN D) 500-200 MG-UNIT per tablet Take 1 tablet by mouth 2 (two) times daily.       . ferrous sulfate 325 (65 FE) MG tablet Take 325 mg by mouth 2 (two) times daily with breakfast and lunch.        . hydrochlorothiazide (HYDRODIURIL) 12.5 MG tablet Take 1 tablet (12.5 mg total) by mouth daily. Patient will call for refill when needed  90 tablet  3  . metoprolol (LOPRESSOR) 100 MG tablet Take 1 tablet (100 mg total) by mouth 2 (two) times daily.  180 tablet  3  . metroNIDAZOLE (METROGEL) 1 % gel Apply 1 application topically every other day. Apply a small amount to affected area at bedtime.  Disp 60 gm.  60 g  5  . omeprazole (PRILOSEC) 40 MG capsule Take 1 capsule (40 mg total) by mouth daily. Patient will call for refill when needed  90 capsule  3  . simvastatin (ZOCOR) 20 MG tablet Take 1 tablet (20 mg total) by mouth daily. Patient will call for refill when needed  90 tablet  3  . traMADol (ULTRAM) 50 MG tablet TAKE ONE TABLET BY MOUTH EVERY 6 HOURS AS NEEDED FOR PAIN  60 tablet  5  . diltiazem (CARDIZEM CD) 120 MG 24 hr capsule Take 1 capsule (120 mg total) by mouth daily.  90 capsule  3  . [DISCONTINUED] omeprazole (PRILOSEC OTC) 20 MG tablet Take 20 mg by mouth 2 (two) times daily.         No current facility-administered medications for this visit.  Allergies  Allergen Reactions  . Iohexol Other (See Comments)    unknown  . Ivp Dye [Iodinated Diagnostic Agents] Hives  . Scallops [Shellfish Allergy] Other (See Comments)    Projectile vomiting.  . Sulfa Antibiotics Hives  . Sulfamethoxazole Hives    REACTION: unspecified    History   Social History  . Marital Status: Widowed    Spouse Name: N/A    Number of Children: 3  . Years of Education: college   Occupational History  . retired Marine scientist    Social History Main Topics  . Smoking status: Former Smoker    Quit date: 02/11/1961  . Smokeless tobacco: Never Used  . Alcohol Use: 0.0 oz/week    7-14 Glasses of wine per week     Comment: 1-2 glasses of wine per day  . Drug Use: No  . Sexual Activity: Not on file   Other Topics Concern  . Not on file   Social History Narrative   Health Care POA:    Emergency  Contact: son, Tabrina Esty (c) [phone]   End of Life Plan:    Who lives with you: self   Any pets: none   Diet: Pt has a varied diet of protein, starch, vegetables.   Exercise: Pt has no regular exercise plan because of pain in back.   Seatbelts: Pt reports wearing seatbelt when in vehicle.   Nancy Fetter Exposure/Protection: Pt reports wearing sun screen.   Hobbies: reading, traveling, eating out.      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 Esophageal cancer Brother   . Diverticulitis Brother   . Colon cancer Neg Hx   . Heart disease Mother   . Stroke Mother     ROS-  All systems are reviewed and are negative except as outlined in the HPI above  Physical Exam: Filed Vitals:   02/22/13 0922  BP: 140/82  Pulse: 107  Height: 5' 2"  (1.575 m)  Weight: 164 lb 12.8 oz (74.753 kg)    GEN- The patient is well appearing, alert and oriented x 3 today.   Head- normocephalic, atraumatic Eyes-  Sclera clear, conjunctiva pink Ears- hearing intact Oropharynx- clear Neck- supple, no JVP Lymph- no cervical lymphadenopathy Lungs- Clear to ausculation bilaterally, normal work of breathing Heart- irregular rate and rhythm, no murmurs, rubs or gallops, PMI not laterally displaced GI- soft, NT, ND, + BS Extremities- no clubbing, cyanosis, or edema MS- no significant deformity or atrophy Skin- no rash or lesion Psych- euthymic mood, full affect Neuro- strength and sensation are intact  ekg today reveas afib V rate 107  Assessment and Plan:  1. afib V rates are little high Will add diltiazem 175m daily Check bmet and cbc on eliquis today  2. HTN Stable No change required today  Return to see LTruitt Merlein 6 weeks. I will see in 3 months

## 2013-02-22 NOTE — Patient Instructions (Signed)
Your physician recommends that you schedule a follow-up appointment in: 6 weeks with Kathrene Alu and 3 months with Dr [name]  Your physician recommends that you return for lab work today: CBC/BMP  Your physician has recommended you make the following change in your medication:  1) Start Diltiazem 159m daily

## 2013-02-22 NOTE — Telephone Encounter (Signed)
Was seen to by Dr. [name] this morning. He added a new med Cardizem 120 mg daily. Also, WBC and BMET were both drawn today. FYI

## 2013-02-23 ENCOUNTER — Telehealth: Payer: Self-pay | Admitting: Internal Medicine

## 2013-02-23 NOTE — Telephone Encounter (Signed)
New message ° ° ° ° °Want test results °

## 2013-02-23 NOTE — Telephone Encounter (Signed)
Patient aware.

## 2013-02-25 ENCOUNTER — Telehealth: Payer: Self-pay | Admitting: Family Medicine

## 2013-02-25 DIAGNOSIS — I10 Essential (primary) hypertension: Secondary | ICD-10-CM

## 2013-02-25 MED ORDER — HYDROCHLOROTHIAZIDE 12.5 MG PO TABS: ORAL_TABLET | ORAL | Status: DC

## 2013-02-25 NOTE — Telephone Encounter (Signed)
Pt was put on a medication by her cardiologist. It has reduced her heart drastically. She is leaving to go out of town by [employer]. She would like to talk to Dr Andria Frames about this [phone]

## 2013-02-25 NOTE — Telephone Encounter (Signed)
Heart rates now normal 60-80 range and blood pressure a bit low ~483 systolic.  Told to hold the HCTZ for now.  Call me when she returns from her trip.

## 2013-03-18 DIAGNOSIS — L738 Other specified follicular disorders: Secondary | ICD-10-CM | POA: Diagnosis not present

## 2013-03-18 DIAGNOSIS — D239 Other benign neoplasm of skin, unspecified: Secondary | ICD-10-CM | POA: Diagnosis not present

## 2013-03-18 DIAGNOSIS — L821 Other seborrheic keratosis: Secondary | ICD-10-CM | POA: Diagnosis not present

## 2013-03-18 DIAGNOSIS — L570 Actinic keratosis: Secondary | ICD-10-CM | POA: Diagnosis not present

## 2013-03-18 DIAGNOSIS — L723 Sebaceous cyst: Secondary | ICD-10-CM | POA: Diagnosis not present

## 2013-03-18 DIAGNOSIS — Z85828 Personal history of other malignant neoplasm of skin: Secondary | ICD-10-CM | POA: Diagnosis not present

## 2013-03-29 ENCOUNTER — Other Ambulatory Visit: Payer: Self-pay

## 2013-03-29 DIAGNOSIS — Z1231 Encounter for screening mammogram for malignant neoplasm of breast: Secondary | ICD-10-CM

## 2013-04-06 ENCOUNTER — Ambulatory Visit: Payer: Medicare Other | Admitting: Nurse Practitioner

## 2013-04-12 DIAGNOSIS — E785 Hyperlipidemia, unspecified: Secondary | ICD-10-CM | POA: Diagnosis not present

## 2013-04-12 DIAGNOSIS — R7309 Other abnormal glucose: Secondary | ICD-10-CM | POA: Diagnosis not present

## 2013-04-12 DIAGNOSIS — Z7901 Long term (current) use of anticoagulants: Secondary | ICD-10-CM | POA: Diagnosis not present

## 2013-04-12 DIAGNOSIS — Z96659 Presence of unspecified artificial knee joint: Secondary | ICD-10-CM | POA: Diagnosis not present

## 2013-04-12 DIAGNOSIS — R197 Diarrhea, unspecified: Secondary | ICD-10-CM | POA: Diagnosis not present

## 2013-04-12 DIAGNOSIS — Z9889 Other specified postprocedural states: Secondary | ICD-10-CM | POA: Diagnosis not present

## 2013-04-12 DIAGNOSIS — I10 Essential (primary) hypertension: Secondary | ICD-10-CM | POA: Diagnosis not present

## 2013-04-12 DIAGNOSIS — Z79899 Other long term (current) drug therapy: Secondary | ICD-10-CM | POA: Diagnosis not present

## 2013-04-12 DIAGNOSIS — K921 Melena: Secondary | ICD-10-CM | POA: Diagnosis not present

## 2013-04-12 DIAGNOSIS — K922 Gastrointestinal hemorrhage, unspecified: Secondary | ICD-10-CM | POA: Diagnosis not present

## 2013-04-12 DIAGNOSIS — K449 Diaphragmatic hernia without obstruction or gangrene: Secondary | ICD-10-CM | POA: Diagnosis not present

## 2013-04-12 DIAGNOSIS — I4891 Unspecified atrial fibrillation: Secondary | ICD-10-CM | POA: Diagnosis not present

## 2013-04-23 ENCOUNTER — Encounter: Payer: Self-pay | Admitting: Nurse Practitioner

## 2013-04-23 ENCOUNTER — Ambulatory Visit (INDEPENDENT_AMBULATORY_CARE_PROVIDER_SITE_OTHER): Payer: Medicare Other | Admitting: Nurse Practitioner

## 2013-04-23 VITALS — BP 120/80 | HR 70 | Ht 62.5 in | Wt 165.4 lb

## 2013-04-23 DIAGNOSIS — I10 Essential (primary) hypertension: Secondary | ICD-10-CM | POA: Diagnosis not present

## 2013-04-23 DIAGNOSIS — I4891 Unspecified atrial fibrillation: Secondary | ICD-10-CM | POA: Diagnosis not present

## 2013-04-23 LAB — CBC
HCT: 40.9 % (ref 36.0–46.0)
Hemoglobin: 13.7 g/dL (ref 12.0–15.0)
MCHC: 33.4 g/dL (ref 30.0–36.0)
MCV: 93.9 fl (ref 78.0–100.0)
Platelets: 319 10*3/uL (ref 150.0–400.0)
RBC: 4.36 Mil/uL (ref 3.87–5.11)
RDW: 13.2 % (ref 11.5–14.6)
WBC: 9.3 10*3/uL (ref 4.5–10.5)

## 2013-04-23 LAB — BASIC METABOLIC PANEL
BUN: 11 mg/dL (ref 6–23)
CO2: 28 mEq/L (ref 19–32)
Calcium: 8.9 mg/dL (ref 8.4–10.5)
Chloride: 96 mEq/L (ref 96–112)
Creatinine, Ser: 0.8 mg/dL (ref 0.4–1.2)
GFR: 70.18 mL/min (ref >60.00)
Glucose, Bld: 94 mg/dL (ref 70–99)
Potassium: 4 mEq/L (ref 3.5–5.1)
Sodium: 134 mEq/L — ABNORMAL LOW (ref 135–145)

## 2013-04-23 LAB — MAGNESIUM: Magnesium: 2.1 mg/dL (ref 1.5–2.5)

## 2013-04-23 NOTE — Progress Notes (Signed)
[place] Date of Birth: 15-Aug-1930 Medical Record #[id number]  History of Present Illness: Sara Russell is seen back today for a 6 week check. Seen for Dr. [name]. She is an 80 year old female with HTN, HLD, DVT, PE, GERD, anemia, DJD and chronic atrial fib. She is anticoagulated with Eliquis.   Last seen here in January. Seemed to be doing ok but noted to have elevated HR. Diltiazem was added. Her follow up labs for her Eliquis were obtained as well.   Comes back today. Here alone. Doing ok. No chest pain. Not short of breath. Minimal swelling of her lower extremities. Not lightheaded or dizzy. No palpitations. She has no awareness of her AF. Since she was last here, she did have a bout of diarrhea - ended up in the ER - resolved now but she would like her labs rechecked. BP and HR at home look great.  Current Outpatient Prescriptions  Medication Sig Dispense Refill  . apixaban (ELIQUIS) 5 MG TABS tablet Take 1 tablet (5 mg total) by mouth 2 (two) times daily.  60 tablet  6  . benazepril (LOTENSIN) 40 MG tablet Take 1 tablet (40 mg total) by mouth daily.  90 tablet  3  . calcium carbonate (TUMS - DOSED IN MG ELEMENTAL CALCIUM) 500 MG chewable tablet Chew 1 tablet by mouth 2 (two) times daily.        . calcium-vitamin D (SM CALCIUM-VITAMIN D) 500-200 MG-UNIT per tablet Take 1 tablet by mouth 2 (two) times daily.       Marland Kitchen diltiazem (CARDIZEM CD) 120 MG 24 hr capsule Take 1 capsule (120 mg total) by mouth daily.  90 capsule  3  . ferrous sulfate 325 (65 FE) MG tablet Take 325 mg by mouth 2 (two) times daily with breakfast and lunch.       . metoprolol (LOPRESSOR) 100 MG tablet Take 1 tablet (100 mg total) by mouth 2 (two) times daily.  180 tablet  3  . metroNIDAZOLE (METROGEL) 1 % gel Apply 1 application topically as needed. Apply a small amount to affected area at bedtime.  Disp 60 gm.      . omeprazole (PRILOSEC) 40 MG capsule Take 1 capsule (40 mg total) by mouth daily. Patient will call  for refill when needed  90 capsule  3  . simvastatin (ZOCOR) 20 MG tablet Take 1 tablet (20 mg total) by mouth daily. Patient will call for refill when needed  90 tablet  3  . traMADol (ULTRAM) 50 MG tablet TAKE ONE TABLET BY MOUTH EVERY 6 HOURS AS NEEDED FOR PAIN  60 tablet  5  . [DISCONTINUED] omeprazole (PRILOSEC OTC) 20 MG tablet Take 20 mg by mouth 2 (two) times daily.         No current facility-administered medications for this visit.    Allergies  Allergen Reactions  . Iohexol Other (See Comments)    unknown  . Ivp Dye [Iodinated Diagnostic Agents] Hives  . Scallops [Shellfish Allergy] Other (See Comments)    Projectile vomiting.  . Sulfa Antibiotics Hives  . Sulfamethoxazole Hives    REACTION: unspecified    Past Medical History  Diagnosis Date  . S/P dilatation of esophageal stricture 2002  . Hypertension   . Hyperlipidemia   . DVT (deep venous thrombosis) 2009  . PE (pulmonary embolism) 2009  . GERD (gastroesophageal reflux disease)   . Diverticulosis   . Iron deficiency anemia   . Hyperplastic colon polyp   .  Positive H. pylori test 01/20/96  . Degenerative joint disease   . Skin cancer     h/o basal and squamous skin cancer--> removed  . Persistent atrial fibrillation     Past Surgical History  Procedure Laterality Date  . Dilation and curettage of uterus      for endometrial polyps  . Replacement total knee      left  . Tonsillectomy    . Appendectomy    . Breast lumpectomy      benign    History  Smoking status  . Former Smoker  . Quit date: 02/11/1961  Smokeless tobacco  . Never Used    History  Alcohol Use  . 0.0 oz/week  . 7-14 Glasses of wine per week    Comment: 1-2 glasses of wine per day    Family History  Problem Relation Age of Onset  . Esophageal cancer Brother   . Diverticulitis Brother   . Colon cancer Neg Hx   . Heart disease Mother   . Stroke Mother     Review of Systems: The review of systems is per the HPI.  All  other systems were reviewed and are negative.  Physical Exam: BP 120/80  Pulse 70  Ht 5' 2.5" (1.588 m)  Wt 165 lb 6.4 oz (75.025 kg)  BMI 29.75 kg/m2 Patient is very pleasant and in no acute distress. She is obese. Skin is warm and dry. Color is normal.  HEENT is unremarkable. Normocephalic/atraumatic. PERRL. Sclera are nonicteric. Neck is supple. No masses. No JVD. Lungs are clear. Cardiac exam shows an irregular rhythm. Rate is ok. Abdomen is soft. Extremities are without edema. Gait and ROM are intact. No gross neurologic deficits noted.  LABORATORY DATA: EKG today shows atrial fib with a well controlled VR of 70.  Lab Results  Component Value Date   WBC 10.3 02/22/2013   HGB 14.1 02/22/2013   HCT 41.8 02/22/2013   PLT 255.0 02/22/2013   GLUCOSE 103* 02/22/2013   CHOL 195 05/18/2012   TRIG 136 05/18/2012   HDL 82 05/18/2012   LDLDIRECT 85 08/18/2008   LDLCALC 86 05/18/2012   ALT 13 05/18/2012   AST 19 05/18/2012   NA 136 02/22/2013   K 4.2 02/22/2013   CL 96 02/22/2013   CREATININE 1.0 02/22/2013   BUN 16 02/22/2013   CO2 29 02/22/2013   TSH 1.943 12/10/2012   INR 1.01 07/06/2009     Assessment / Plan: 1. Chronic atrial fib - better rate control - anticoagulated with Eliquis   2. HTN - BP looks great.   3. HLD  4. Chronic anticoagulation - no problems  5. Diarrhea - found to have low Mg level. Recheck her labs today.   See her back as planned. No change with her current regimen.  Patient is agreeable to this plan and will call if any problems develop in the interim.   Burtis Junes, RN, [place] [address] [place], [place]  [address] [phone]

## 2013-04-23 NOTE — Patient Instructions (Addendum)
Continue with your current medicines  We will recheck your labs today  See Dr. [name] back as planned  Call the [place] office at [phone] if you have any questions, problems or concerns.

## 2013-05-03 ENCOUNTER — Telehealth: Payer: Self-pay | Admitting: *Deleted

## 2013-05-03 NOTE — Telephone Encounter (Signed)
Faxed patient assistance form completed by MD with script to [facility] for patients [facility], patient has her application filled out and would like to mail all together, will mail her back the MD portion with script.   I DID KEEP A COPY IN MY FILE.

## 2013-05-05 ENCOUNTER — Ambulatory Visit
Admission: RE | Admit: 2013-05-05 | Discharge: 2013-05-05 | Disposition: A | Discharge status: Home / Self Care | Payer: Medicare Other | Source: Ambulatory Visit

## 2013-05-05 DIAGNOSIS — Z1231 Encounter for screening mammogram for malignant neoplasm of breast: Secondary | ICD-10-CM | POA: Diagnosis not present

## 2013-05-11 ENCOUNTER — Telehealth: Payer: Self-pay | Admitting: Family Medicine

## 2013-05-11 DIAGNOSIS — K219 Gastro-esophageal reflux disease without esophagitis: Secondary | ICD-10-CM

## 2013-05-11 DIAGNOSIS — I10 Essential (primary) hypertension: Secondary | ICD-10-CM

## 2013-05-11 NOTE — Telephone Encounter (Signed)
Pt called because [facility] said that we have sent back there request for refills for her Simvastatin 20 mg and Omeprazole 40 mg. Sara Russell

## 2013-05-12 MED ORDER — SIMVASTATIN 20 MG PO TABS: 20.0000 mg | ORAL_TABLET | Freq: Every day | ORAL | Status: DC

## 2013-05-12 MED ORDER — OMEPRAZOLE 40 MG PO CPDR: 40.0000 mg | DELAYED_RELEASE_CAPSULE | Freq: Every day | ORAL | Status: DC

## 2013-05-12 NOTE — Telephone Encounter (Signed)
Done and patient notified.

## 2013-05-12 NOTE — Telephone Encounter (Signed)
Pt called to check the status of her refill request. Sara Russell

## 2013-05-13 ENCOUNTER — Other Ambulatory Visit: Payer: Self-pay | Admitting: *Deleted

## 2013-05-13 ENCOUNTER — Telehealth: Payer: Self-pay | Admitting: Family Medicine

## 2013-05-13 DIAGNOSIS — I10 Essential (primary) hypertension: Secondary | ICD-10-CM

## 2013-05-13 MED ORDER — BENAZEPRIL HCL 40 MG PO TABS: 40.0000 mg | ORAL_TABLET | Freq: Every day | ORAL | Status: DC

## 2013-05-13 NOTE — Telephone Encounter (Signed)
Done

## 2013-05-13 NOTE — Telephone Encounter (Signed)
Pt called and needs a refill on her Benazepril called in today since she is going out of town. She requested the wrong medication the other day. jw

## 2013-05-28 ENCOUNTER — Ambulatory Visit (INDEPENDENT_AMBULATORY_CARE_PROVIDER_SITE_OTHER): Payer: Medicare Other | Admitting: Internal Medicine

## 2013-05-28 VITALS — BP 155/76 | HR 88 | Ht 62.0 in | Wt 164.0 lb

## 2013-05-28 DIAGNOSIS — I10 Essential (primary) hypertension: Secondary | ICD-10-CM

## 2013-05-28 DIAGNOSIS — I4891 Unspecified atrial fibrillation: Secondary | ICD-10-CM

## 2013-05-28 LAB — BASIC METABOLIC PANEL
BUN: 8 mg/dL (ref 6–23)
CO2: 27 mEq/L (ref 19–32)
Calcium: 9.2 mg/dL (ref 8.4–10.5)
Chloride: 100 mEq/L (ref 96–112)
Creatinine, Ser: 0.8 mg/dL (ref 0.4–1.2)
GFR: 74.28 mL/min (ref >60.00)
Glucose, Bld: 94 mg/dL (ref 70–99)
Potassium: 4.1 mEq/L (ref 3.5–5.1)
Sodium: 136 mEq/L (ref 135–145)

## 2013-05-28 LAB — CBC WITH DIFFERENTIAL/PLATELET
Basophils Absolute: 0.1 10*3/uL (ref 0.0–0.1)
Basophils Relative: 0.6 % (ref 0.0–3.0)
Eosinophils Absolute: 0.1 10*3/uL (ref 0.0–0.7)
Eosinophils Relative: 1.2 % (ref 0.0–5.0)
HCT: 41.3 % (ref 36.0–46.0)
Hemoglobin: 13.9 g/dL (ref 12.0–15.0)
Lymphocytes Relative: 12.5 % (ref 12.0–46.0)
Lymphs Abs: 1.3 10*3/uL (ref 0.7–4.0)
MCHC: 33.7 g/dL (ref 30.0–36.0)
MCV: 93.9 fl (ref 78.0–100.0)
Monocytes Absolute: 0.7 10*3/uL (ref 0.1–1.0)
Monocytes Relative: 7 % (ref 3.0–12.0)
Neutro Abs: 8.4 10*3/uL — ABNORMAL HIGH (ref 1.4–7.7)
Neutrophils Relative %: 78.7 % — ABNORMAL HIGH (ref 43.0–77.0)
Platelets: 440 10*3/uL — ABNORMAL HIGH (ref 150.0–400.0)
RBC: 4.4 Mil/uL (ref 3.87–5.11)
RDW: 13.4 % (ref 11.5–14.6)
WBC: 10.7 10*3/uL — ABNORMAL HIGH (ref 4.5–10.5)

## 2013-05-28 NOTE — Progress Notes (Signed)
PCP:  Zigmund Gottron, MD  The patient presents today for routine electrophysiology followup.  Since last being seen in our clinic, the patient reports doing well.  Sara Russell remains active for her age.  Today, Sara Russell denies symptoms of palpitations, chest pain, shortness of breath, orthopnea, PND, lower extremity edema, dizziness, presyncope, syncope, or neurologic sequela.  The patient feels that Sara Russell is tolerating medications without difficulties and is otherwise without complaint today.   Past Medical History  Diagnosis Date  . S/P dilatation of esophageal stricture 2002  . Hypertension   . Hyperlipidemia   . DVT (deep venous thrombosis) 2009  . PE (pulmonary embolism) 2009  . GERD (gastroesophageal reflux disease)   . Diverticulosis   . Iron deficiency anemia   . Hyperplastic colon polyp   . Positive H. pylori test 01/20/96  . Degenerative joint disease   . Skin cancer     h/o basal and squamous skin cancer--> removed  . Persistent atrial fibrillation    Past Surgical History  Procedure Laterality Date  . Dilation and curettage of uterus      for endometrial polyps  . Replacement total knee      left  . Tonsillectomy    . Appendectomy    . Breast lumpectomy      benign    Current Outpatient Prescriptions  Medication Sig Dispense Refill  . apixaban (ELIQUIS) 5 MG TABS tablet Take 1 tablet (5 mg total) by mouth 2 (two) times daily.  60 tablet  6  . benazepril (LOTENSIN) 40 MG tablet Take 1 tablet (40 mg total) by mouth daily.  90 tablet  3  . calcium carbonate (TUMS - DOSED IN MG ELEMENTAL CALCIUM) 500 MG chewable tablet Chew 1 tablet by mouth 2 (two) times daily.        . calcium-vitamin D (SM CALCIUM-VITAMIN D) 500-200 MG-UNIT per tablet Take 1 tablet by mouth 2 (two) times daily.       Marland Kitchen diltiazem (CARDIZEM CD) 120 MG 24 hr capsule Take 1 capsule (120 mg total) by mouth daily.  90 capsule  3  . ferrous sulfate 325 (65 FE) MG tablet Take 325 mg by mouth 2 (two) times  daily with breakfast and lunch.       . metoprolol (LOPRESSOR) 100 MG tablet Take 1 tablet (100 mg total) by mouth 2 (two) times daily.  180 tablet  3  . metroNIDAZOLE (METROGEL) 1 % gel Apply 1 application topically as needed. Apply a small amount to affected area at bedtime.  Disp 60 gm.      . omeprazole (PRILOSEC) 40 MG capsule Take 1 capsule (40 mg total) by mouth daily.  90 capsule  3  . simvastatin (ZOCOR) 20 MG tablet Take 1 tablet (20 mg total) by mouth daily.  90 tablet  3  . traMADol (ULTRAM) 50 MG tablet TAKE ONE TABLET BY MOUTH EVERY 6 HOURS AS NEEDED FOR PAIN  60 tablet  5  . [DISCONTINUED] omeprazole (PRILOSEC OTC) 20 MG tablet Take 20 mg by mouth 2 (two) times daily.         No current facility-administered medications for this visit.    Allergies  Allergen Reactions  . Iohexol Other (See Comments)    unknown  . Ivp Dye [Iodinated Diagnostic Agents] Hives  . Scallops [Shellfish Allergy] Other (See Comments)    Projectile vomiting.  . Sulfa Antibiotics Hives  . Sulfamethoxazole Hives    REACTION: unspecified    History   Social  History  . Marital Status: Widowed    Spouse Name: N/A    Number of Children: 3  . Years of Education: college   Occupational History  . retired Marine scientist    Social History Main Topics  . Smoking status: Former Smoker    Quit date: 02/11/1961  . Smokeless tobacco: Never Used  . Alcohol Use: 0.0 oz/week    7-14 Glasses of wine per week     Comment: 1-2 glasses of wine per day  . Drug Use: No  . Sexual Activity: Not Currently   Other Topics Concern  . Not on file   Social History Narrative   Health Care POA:    Emergency Contact: son, Arella Blinder (c) [phone]   End of Life Plan:    Who lives with you: self   Any pets: none   Diet: Pt has a varied diet of protein, starch, vegetables.   Exercise: Pt has no regular exercise plan because of pain in back.   Seatbelts: Pt reports wearing seatbelt when in vehicle.   Nancy Fetter  Exposure/Protection: Pt reports wearing sun screen.   Hobbies: reading, traveling, eating out.      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 Esophageal cancer Brother   . Diverticulitis Brother   . Colon cancer Neg Hx   . Heart disease Mother   . Stroke Mother     ROS-  All systems are reviewed and are negative except as outlined in the HPI above  Physical Exam: Filed Vitals:   05/28/13 0921  BP: 155/76  Pulse: 88  Height: 5' 2"  (1.575 m)  Weight: 164 lb (74.39 kg)    GEN- The patient is well appearing, alert and oriented x 3 today.   Head- normocephalic, atraumatic Eyes-  Sclera clear, conjunctiva pink Ears- hearing intact Oropharynx- clear Neck- supple, no JVP Lymph- no cervical lymphadenopathy Lungs- Clear to ausculation bilaterally, normal work of breathing Heart- irregular rate and rhythm, no murmurs, rubs or gallops, PMI not laterally displaced GI- soft, NT, ND, + BS Extremities- no clubbing, cyanosis, or edema Neuro- strength and sensation are intact  ekg today reveas afib V rate 80  Assessment and Plan:  1. Permanent afib V rates are stable Continue eliquis.  Sara Russell will look to see if xarelto would be cheaper for her.  I have stressed the importance of long term anticoagulation for stroke prevention Cbc and bmet today  2. HTN Repeat by MD is 140/68, Sara Russell reports good control at home bmet today No change required today  Return to see Truitt Merle in 4 months I will see in 12 months

## 2013-05-28 NOTE — Patient Instructions (Signed)
Your physician recommends that you schedule a follow-up appointment in: 4 months with Truitt Merle, NP and Dr [name] in 12 months

## 2013-06-03 ENCOUNTER — Telehealth: Payer: Self-pay | Admitting: Family Medicine

## 2013-06-03 DIAGNOSIS — D649 Anemia, unspecified: Secondary | ICD-10-CM

## 2013-06-03 NOTE — Telephone Encounter (Signed)
Please review patient's latest labs and call her after reviewing

## 2013-06-04 MED ORDER — FERROUS SULFATE 325 (65 FE) MG PO TABS: 325.0000 mg | ORAL_TABLET | Freq: Every day | ORAL | Status: DC

## 2013-06-04 NOTE — Assessment & Plan Note (Signed)
Hgb nicely stable.  Decrease iron to once daily and FU CBC in one month.

## 2013-06-07 ENCOUNTER — Telehealth: Payer: Self-pay | Admitting: Internal Medicine

## 2013-06-07 NOTE — Telephone Encounter (Signed)
New message     Pt is on eliquis.  [name] is supposed to check with ins company to see if xarelto is cheaper than eliquis.  Pt is calling to get the results

## 2013-06-08 ENCOUNTER — Other Ambulatory Visit: Payer: Self-pay | Admitting: *Deleted

## 2013-06-08 MED ORDER — TRAMADOL HCL 50 MG PO TABS: ORAL_TABLET | ORAL | Status: DC

## 2013-06-08 NOTE — Telephone Encounter (Signed)
Called [facility] for pricing of eliquis and xarelto, patient presently on eliquis.  eliquis 60/30-----$98.00 180/90---$268.00  xarelto 30/30-----$101.00 90/90-----$268.00

## 2013-06-08 NOTE — Telephone Encounter (Signed)
Informed patient of anticoagulation agent cost, patient states she was turned down by [facility] for [place] assistance due to her 3rd party [employer]. We will continue to assist her with samples when available.

## 2013-06-09 ENCOUNTER — Telehealth: Payer: Self-pay | Admitting: *Deleted

## 2013-06-09 NOTE — Telephone Encounter (Signed)
Patient would like for you to call her at [phone]. Thanks, MI

## 2013-06-16 ENCOUNTER — Telehealth: Payer: Self-pay | Admitting: Internal Medicine

## 2013-06-16 NOTE — Telephone Encounter (Signed)
I will have her Rx signed and fax in

## 2013-06-16 NOTE — Telephone Encounter (Signed)
New message     Talk to [facility] regarding her eliquis

## 2013-06-17 NOTE — Telephone Encounter (Signed)
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

## 2013-06-30 NOTE — Telephone Encounter (Signed)
error 

## 2013-07-09 ENCOUNTER — Other Ambulatory Visit: Payer: Medicare Other

## 2013-07-09 DIAGNOSIS — D649 Anemia, unspecified: Secondary | ICD-10-CM | POA: Diagnosis not present

## 2013-07-09 LAB — CBC
HCT: 40.5 % (ref 36.0–46.0)
Hemoglobin: 13.9 g/dL (ref 12.0–15.0)
MCH: 31.2 pg (ref 26.0–34.0)
MCHC: 34.3 g/dL (ref 30.0–36.0)
MCV: 91 fL (ref 78.0–100.0)
Platelets: 258 10*3/uL (ref 150–400)
RBC: 4.45 MIL/uL (ref 3.87–5.11)
RDW: 13.6 % (ref 11.5–15.5)
WBC: 7 10*3/uL (ref 4.0–10.5)

## 2013-07-09 NOTE — Progress Notes (Signed)
CBC DONE TODAY Sara Russell

## 2013-08-02 ENCOUNTER — Inpatient Hospital Stay (HOSPITAL_COMMUNITY)
Admission: EM | Admit: 2013-08-02 | Discharge: 2013-08-06 | DRG: 191 | Disposition: A | Discharge status: Home Health | Payer: Medicare Other | Attending: Family Medicine | Admitting: Family Medicine

## 2013-08-02 ENCOUNTER — Encounter (HOSPITAL_COMMUNITY): Payer: Self-pay | Admitting: Emergency Medicine

## 2013-08-02 ENCOUNTER — Emergency Department (HOSPITAL_COMMUNITY): Payer: Medicare Other

## 2013-08-02 DIAGNOSIS — E871 Hypo-osmolality and hyponatremia: Secondary | ICD-10-CM | POA: Diagnosis not present

## 2013-08-02 DIAGNOSIS — E785 Hyperlipidemia, unspecified: Secondary | ICD-10-CM | POA: Diagnosis present

## 2013-08-02 DIAGNOSIS — Z823 Family history of stroke: Secondary | ICD-10-CM

## 2013-08-02 DIAGNOSIS — Z91041 Radiographic dye allergy status: Secondary | ICD-10-CM

## 2013-08-02 DIAGNOSIS — Z8249 Family history of ischemic heart disease and other diseases of the circulatory system: Secondary | ICD-10-CM

## 2013-08-02 DIAGNOSIS — D649 Anemia, unspecified: Secondary | ICD-10-CM | POA: Diagnosis present

## 2013-08-02 DIAGNOSIS — Z800 Family history of malignant neoplasm of digestive organs: Secondary | ICD-10-CM

## 2013-08-02 DIAGNOSIS — K219 Gastro-esophageal reflux disease without esophagitis: Secondary | ICD-10-CM | POA: Diagnosis present

## 2013-08-02 DIAGNOSIS — Z86711 Personal history of pulmonary embolism: Secondary | ICD-10-CM

## 2013-08-02 DIAGNOSIS — M1990 Unspecified osteoarthritis, unspecified site: Secondary | ICD-10-CM | POA: Diagnosis present

## 2013-08-02 DIAGNOSIS — L719 Rosacea, unspecified: Secondary | ICD-10-CM | POA: Diagnosis present

## 2013-08-02 DIAGNOSIS — I4819 Other persistent atrial fibrillation: Secondary | ICD-10-CM

## 2013-08-02 DIAGNOSIS — J984 Other disorders of lung: Secondary | ICD-10-CM | POA: Diagnosis not present

## 2013-08-02 DIAGNOSIS — Z882 Allergy status to sulfonamides status: Secondary | ICD-10-CM | POA: Diagnosis not present

## 2013-08-02 DIAGNOSIS — K449 Diaphragmatic hernia without obstruction or gangrene: Secondary | ICD-10-CM | POA: Diagnosis present

## 2013-08-02 DIAGNOSIS — J441 Chronic obstructive pulmonary disease with (acute) exacerbation: Principal | ICD-10-CM | POA: Diagnosis present

## 2013-08-02 DIAGNOSIS — I10 Essential (primary) hypertension: Secondary | ICD-10-CM | POA: Diagnosis not present

## 2013-08-02 DIAGNOSIS — Z91013 Allergy to seafood: Secondary | ICD-10-CM

## 2013-08-02 DIAGNOSIS — R079 Chest pain, unspecified: Secondary | ICD-10-CM

## 2013-08-02 DIAGNOSIS — R0789 Other chest pain: Secondary | ICD-10-CM | POA: Diagnosis present

## 2013-08-02 DIAGNOSIS — Z87891 Personal history of nicotine dependence: Secondary | ICD-10-CM | POA: Diagnosis not present

## 2013-08-02 DIAGNOSIS — Z85828 Personal history of other malignant neoplasm of skin: Secondary | ICD-10-CM

## 2013-08-02 DIAGNOSIS — R072 Precordial pain: Secondary | ICD-10-CM | POA: Diagnosis not present

## 2013-08-02 DIAGNOSIS — Z7901 Long term (current) use of anticoagulants: Secondary | ICD-10-CM

## 2013-08-02 DIAGNOSIS — R0602 Shortness of breath: Secondary | ICD-10-CM | POA: Diagnosis not present

## 2013-08-02 DIAGNOSIS — I480 Paroxysmal atrial fibrillation: Secondary | ICD-10-CM

## 2013-08-02 DIAGNOSIS — I4891 Unspecified atrial fibrillation: Secondary | ICD-10-CM | POA: Diagnosis present

## 2013-08-02 DIAGNOSIS — I482 Chronic atrial fibrillation, unspecified: Secondary | ICD-10-CM

## 2013-08-02 DIAGNOSIS — I517 Cardiomegaly: Secondary | ICD-10-CM | POA: Diagnosis present

## 2013-08-02 DIAGNOSIS — Z86718 Personal history of other venous thrombosis and embolism: Secondary | ICD-10-CM | POA: Diagnosis not present

## 2013-08-02 LAB — URINALYSIS, ROUTINE W REFLEX MICROSCOPIC
Bilirubin Urine: NEGATIVE
Glucose, UA: NEGATIVE mg/dL
Ketones, ur: NEGATIVE mg/dL
Leukocytes, UA: NEGATIVE
Nitrite: NEGATIVE
Protein, ur: NEGATIVE mg/dL
Specific Gravity, Urine: 1.015 (ref 1.005–1.030)
Urobilinogen, UA: 1 mg/dL (ref 0.0–1.0)
pH: 6.5 (ref 5.0–8.0)

## 2013-08-02 LAB — CBC WITH DIFFERENTIAL/PLATELET
Basophils Absolute: 0 10*3/uL (ref 0.0–0.1)
Basophils Relative: 0 % (ref 0–1)
Eosinophils Absolute: 0 10*3/uL (ref 0.0–0.7)
Eosinophils Relative: 0 % (ref 0–5)
HCT: 38.6 % (ref 36.0–46.0)
Hemoglobin: 13.4 g/dL (ref 12.0–15.0)
Lymphocytes Relative: 5 % — ABNORMAL LOW (ref 12–46)
Lymphs Abs: 0.5 10*3/uL — ABNORMAL LOW (ref 0.7–4.0)
MCH: 31.5 pg (ref 26.0–34.0)
MCHC: 34.7 g/dL (ref 30.0–36.0)
MCV: 90.6 fL (ref 78.0–100.0)
Monocytes Absolute: 0.1 10*3/uL (ref 0.1–1.0)
Monocytes Relative: 1 % — ABNORMAL LOW (ref 3–12)
Neutro Abs: 9.3 10*3/uL — ABNORMAL HIGH (ref 1.7–7.7)
Neutrophils Relative %: 94 % — ABNORMAL HIGH (ref 43–77)
Platelets: 320 10*3/uL (ref 150–400)
RBC: 4.26 MIL/uL (ref 3.87–5.11)
RDW: 12.8 % (ref 11.5–15.5)
WBC: 9.9 10*3/uL (ref 4.0–10.5)

## 2013-08-02 LAB — BASIC METABOLIC PANEL
BUN: 11 mg/dL (ref 6–23)
CO2: 23 mEq/L (ref 19–32)
Calcium: 8.5 mg/dL (ref 8.4–10.5)
Chloride: 101 mEq/L (ref 96–112)
Creatinine, Ser: 0.75 mg/dL (ref 0.50–1.10)
GFR calc Af Amer: >90 mL/min (ref >90)
GFR calc non Af Amer: 78 mL/min — ABNORMAL LOW (ref >90)
Glucose, Bld: 117 mg/dL — ABNORMAL HIGH (ref 70–99)
Potassium: 3.5 mEq/L — ABNORMAL LOW (ref 3.7–5.3)
Sodium: 137 mEq/L (ref 137–147)

## 2013-08-02 LAB — TSH: TSH: 1.24 u[IU]/mL (ref 0.350–4.500)

## 2013-08-02 LAB — URINE MICROSCOPIC-ADD ON

## 2013-08-02 LAB — I-STAT TROPONIN, ED: Troponin i, poc: 0.01 ng/mL (ref 0.00–0.08)

## 2013-08-02 LAB — MRSA PCR SCREENING: MRSA by PCR: NEGATIVE

## 2013-08-02 LAB — LIPID PANEL
Cholesterol: 103 mg/dL (ref 0–200)
HDL: 58 mg/dL (ref >39)
LDL Cholesterol: 30 mg/dL (ref 0–99)
Total CHOL/HDL Ratio: 1.8 RATIO
Triglycerides: 76 mg/dL (ref <150)
VLDL: 15 mg/dL (ref 0–40)

## 2013-08-02 LAB — HEMOGLOBIN A1C
Hgb A1c MFr Bld: 5.3 % (ref <5.7)
Mean Plasma Glucose: 105 mg/dL (ref <117)

## 2013-08-02 LAB — TROPONIN I
Troponin I: <0.3 ng/mL (ref <0.30)
Troponin I: <0.3 ng/mL (ref <0.30)

## 2013-08-02 MED ORDER — NITROGLYCERIN 0.4 MG SL SUBL
0.4000 mg | SUBLINGUAL_TABLET | SUBLINGUAL | Status: DC | PRN
  Administered 2013-08-02 (×2): 0.4 mg via SUBLINGUAL
  Filled 2013-08-02: qty 1

## 2013-08-02 MED ORDER — APIXABAN 5 MG PO TABS
5.0000 mg | ORAL_TABLET | Freq: Two times a day (BID) | ORAL | Status: DC
  Administered 2013-08-02 – 2013-08-06 (×9): 5 mg via ORAL
  Filled 2013-08-02 (×10): qty 1

## 2013-08-02 MED ORDER — ASPIRIN 81 MG PO CHEW
81.0000 mg | CHEWABLE_TABLET | Freq: Every day | ORAL | Status: DC
  Administered 2013-08-02 – 2013-08-06 (×5): 81 mg via ORAL
  Filled 2013-08-02 (×5): qty 1

## 2013-08-02 MED ORDER — NITROGLYCERIN 2 % TD OINT
0.5000 [in_us] | TOPICAL_OINTMENT | Freq: Four times a day (QID) | TRANSDERMAL | Status: DC
  Administered 2013-08-02 – 2013-08-03 (×3): 0.5 [in_us] via TOPICAL
  Filled 2013-08-02: qty 30
  Filled 2013-08-02: qty 1

## 2013-08-02 MED ORDER — DILTIAZEM HCL 25 MG/5ML IV SOLN: 18.0000 mg | Freq: Once | INTRAVENOUS | Status: DC

## 2013-08-02 MED ORDER — SIMVASTATIN 20 MG PO TABS
20.0000 mg | ORAL_TABLET | Freq: Every day | ORAL | Status: DC
  Administered 2013-08-02 – 2013-08-04 (×3): 20 mg via ORAL
  Filled 2013-08-02 (×4): qty 1

## 2013-08-02 MED ORDER — MORPHINE SULFATE 4 MG/ML IJ SOLN
4.0000 mg | INTRAMUSCULAR | Status: DC | PRN
  Administered 2013-08-02: 4 mg via INTRAVENOUS
  Administered 2013-08-03 (×4): 2 mg via INTRAVENOUS
  Filled 2013-08-02 (×3): qty 1

## 2013-08-02 MED ORDER — POTASSIUM CHLORIDE CRYS ER 20 MEQ PO TBCR
40.0000 meq | EXTENDED_RELEASE_TABLET | Freq: Once | ORAL | Status: AC
  Administered 2013-08-02: 40 meq via ORAL
  Filled 2013-08-02: qty 2

## 2013-08-02 MED ORDER — DILTIAZEM HCL 100 MG IV SOLR
5.0000 mg/h | INTRAVENOUS | Status: DC
  Administered 2013-08-02 (×2): 5 mg/h via INTRAVENOUS
  Filled 2013-08-02: qty 100

## 2013-08-02 MED ORDER — MORPHINE SULFATE 2 MG/ML IJ SOLN
2.0000 mg | Freq: Once | INTRAMUSCULAR | Status: AC
  Administered 2013-08-02: 2 mg via INTRAVENOUS
  Filled 2013-08-02: qty 1

## 2013-08-02 MED ORDER — SODIUM CHLORIDE 0.9 % IV BOLUS (SEPSIS)
500.0000 mL | Freq: Once | INTRAVENOUS | Status: AC
  Administered 2013-08-02: 500 mL via INTRAVENOUS

## 2013-08-02 MED ORDER — PANTOPRAZOLE SODIUM 40 MG PO TBEC
40.0000 mg | DELAYED_RELEASE_TABLET | Freq: Every day | ORAL | Status: DC
  Administered 2013-08-02 – 2013-08-06 (×5): 40 mg via ORAL
  Filled 2013-08-02 (×5): qty 1

## 2013-08-02 MED ORDER — REGADENOSON 0.4 MG/5ML IV SOLN
0.4000 mg | Freq: Once | INTRAVENOUS | Status: AC
  Administered 2013-08-03: 0.4 mg via INTRAVENOUS
  Filled 2013-08-02: qty 5

## 2013-08-02 MED ORDER — ACETAMINOPHEN 325 MG PO TABS
650.0000 mg | ORAL_TABLET | Freq: Four times a day (QID) | ORAL | Status: DC | PRN
  Administered 2013-08-02: 650 mg via ORAL
  Filled 2013-08-02: qty 2

## 2013-08-02 MED ORDER — TRAMADOL HCL 50 MG PO TABS
50.0000 mg | ORAL_TABLET | Freq: Every day | ORAL | Status: DC
  Administered 2013-08-02: 50 mg via ORAL
  Filled 2013-08-02: qty 1

## 2013-08-02 MED ORDER — BENAZEPRIL HCL 40 MG PO TABS
40.0000 mg | ORAL_TABLET | Freq: Every day | ORAL | Status: DC
  Administered 2013-08-02: 40 mg via ORAL
  Filled 2013-08-02 (×2): qty 1

## 2013-08-02 MED ORDER — METOPROLOL TARTRATE 100 MG PO TABS
100.0000 mg | ORAL_TABLET | Freq: Two times a day (BID) | ORAL | Status: DC
  Administered 2013-08-02 – 2013-08-06 (×9): 100 mg via ORAL
  Filled 2013-08-02 (×10): qty 1

## 2013-08-02 NOTE — Progress Notes (Signed)
Patient's temperature at 1930 = 101.5 orally.  Patient also complained of midsternal chest pain.  MD paged.  Orders received.

## 2013-08-02 NOTE — Progress Notes (Signed)
Utilization Review Completed.Donne Anon T6/22/2015

## 2013-08-02 NOTE — ED Notes (Signed)
Pharmacy to Send Cardiazem drip to [facility]

## 2013-08-02 NOTE — ED Provider Notes (Signed)
CSN: [id number]     Arrival date & time 08/02/13  0608 History   First MD Initiated Contact with Patient 08/02/13 [phone]     Chief Complaint  Patient presents with  . Chest Pain     (Consider location/radiation/quality/duration/timing/severity/associated sxs/prior Treatment) HPI Comments: She is an 80 year old female with HTN, HLD, DVT, PE, GERD, anemia, DJD and chronic atrial fib on Eliquis presents with substernal pressure since 0400 today. The patient reports pressure upon waking. She reports the discomfort as 10/10 at it's worse, currently 4/10, non-radiating, constant, heaviness. Reports partial relief with nitro. The patient reports dyspnea, is not on home oxygen. The patient denies fever, cough.  She reports chills and diaphoresis since this morning. Denies history of MI.   PCP: Zigmund Gottron, MD Cardiology: Danielle Rankin.   Patient is a 80 y.o. female presenting with chest pain. The history is provided by the patient and medical records. No language interpreter was used.  Chest Pain Pain location:  Substernal area Pain quality: pressure   Pain radiates to:  Does not radiate Pain radiates to the back: no   Pain severity:  Moderate (10/10 worst, currently 4/10) Onset quality:  Gradual Duration:  3 hours Timing:  Constant Progression:  Partially resolved Chronicity:  New Context: not breathing   Associated symptoms: diaphoresis and shortness of breath   Associated symptoms: no abdominal pain, no cough, no fever, no nausea and not vomiting     Past Medical History  Diagnosis Date  . S/P dilatation of esophageal stricture 2002  . Hypertension   . Hyperlipidemia   . DVT (deep venous thrombosis) 2009  . PE (pulmonary embolism) 2009  . GERD (gastroesophageal reflux disease)   . Diverticulosis   . Iron deficiency anemia   . Hyperplastic colon polyp   . Positive H. pylori test 01/20/96  . Degenerative joint disease   . Skin cancer     h/o basal and squamous skin  cancer--> removed  . Persistent atrial fibrillation    Past Surgical History  Procedure Laterality Date  . Dilation and curettage of uterus      for endometrial polyps  . Replacement total knee      left  . Tonsillectomy    . Appendectomy    . Breast lumpectomy      benign   Family History  Problem Relation Age of Onset  . Esophageal cancer Brother   . Diverticulitis Brother   . Colon cancer Neg Hx   . Heart disease Mother   . Stroke Mother    History  Substance Use Topics  . Smoking status: Former Smoker    Quit date: 02/11/1961  . Smokeless tobacco: Never Used  . Alcohol Use: 0.0 oz/week    7-14 Glasses of wine per week     Comment: 1-2 glasses of wine per day   OB History   Grav Para Term Preterm Abortions TAB SAB Ect Mult Living                 Review of Systems  Constitutional: Positive for chills and diaphoresis. Negative for fever.  Respiratory: Positive for shortness of breath. Negative for cough.   Cardiovascular: Positive for chest pain. Negative for leg swelling.  Gastrointestinal: Negative for nausea, vomiting and abdominal pain.      Allergies  Iohexol; Ivp dye; Scallops; Sulfa antibiotics; and Sulfamethoxazole  Home Medications   Prior to Admission medications   Medication Sig Start Date End Date Taking? Authorizing Provider  apixaban (  ELIQUIS) 5 MG TABS tablet Take 1 tablet (5 mg total) by mouth 2 (two) times daily. 02/22/13   Thompson Grayer, MD  benazepril (LOTENSIN) 40 MG tablet Take 1 tablet (40 mg total) by mouth daily. 05/13/13   Zigmund Gottron, MD  calcium carbonate (TUMS - DOSED IN MG ELEMENTAL CALCIUM) 500 MG chewable tablet Chew 1 tablet by mouth 2 (two) times daily.      Historical Provider, MD  calcium-vitamin D (SM CALCIUM-VITAMIN D) 500-200 MG-UNIT per tablet Take 1 tablet by mouth 2 (two) times daily.     Historical Provider, MD  diltiazem (CARDIZEM CD) 120 MG 24 hr capsule Take 1 capsule (120 mg total) by mouth daily. 02/22/13    Thompson Grayer, MD  ferrous sulfate 325 (65 FE) MG tablet Take 1 tablet (325 mg total) by mouth daily with breakfast. 06/04/13   Zigmund Gottron, MD  metoprolol (LOPRESSOR) 100 MG tablet Take 1 tablet (100 mg total) by mouth 2 (two) times daily. 12/30/12   Zigmund Gottron, MD  metroNIDAZOLE (METROGEL) 1 % gel Apply 1 application topically as needed. Apply a small amount to affected area at bedtime.  Disp 60 gm. 05/15/12   Zigmund Gottron, MD  omeprazole (PRILOSEC) 40 MG capsule Take 1 capsule (40 mg total) by mouth daily. 05/12/13 05/12/14  Zigmund Gottron, MD  simvastatin (ZOCOR) 20 MG tablet Take 1 tablet (20 mg total) by mouth daily. 05/12/13   Zigmund Gottron, MD  traMADol (ULTRAM) 50 MG tablet TAKE ONE TABLET BY MOUTH EVERY 6 HOURS AS NEEDED FOR PAIN 06/08/13   Zigmund Gottron, MD   BP 169/114  Pulse 118  Temp(Src) 97.5 F (36.4 C) (Oral)  Resp 20  SpO2 93% Physical Exam  Nursing note and vitals reviewed. Constitutional: She is oriented to person, place, and time. She appears well-developed and well-nourished.  Non-toxic appearance. She does not have a sickly appearance. She does not appear ill. No distress.  HENT:  Head: Normocephalic and atraumatic.  Mouth/Throat: Mucous membranes are dry.  Eyes: EOM are normal. Pupils are equal, round, and reactive to light.  Neck: Normal range of motion. Neck supple.  Cardiovascular: An irregularly irregular rhythm present. Tachycardia present.   No lower extremity edema  Pulmonary/Chest: No respiratory distress. She has no decreased breath sounds. She has no wheezes. She has no rhonchi. She has no rales.  Abdominal: Soft. There is no tenderness. There is no rebound and no guarding.  Musculoskeletal: Normal range of motion.  Neurological: She is alert and oriented to person, place, and time.  Skin: Skin is warm and dry. She is not diaphoretic.  Psychiatric: She has a normal mood and affect. Her behavior is normal.     ED Course  Procedures (including critical care time) Labs Review Results for orders placed during the hospital encounter of 08/02/13  CBC WITH DIFFERENTIAL      Result Value Ref Range   WBC 9.9  4.0 - 10.5 K/uL   RBC 4.26  3.87 - 5.11 MIL/uL   Hemoglobin 13.4  12.0 - 15.0 g/dL   HCT 38.6  36.0 - 46.0 %   MCV 90.6  78.0 - 100.0 fL   MCH 31.5  26.0 - 34.0 pg   MCHC 34.7  30.0 - 36.0 g/dL   RDW 12.8  11.5 - 15.5 %   Platelets 320  150 - 400 K/uL   Neutrophils Relative % 94 (*) 43 - 77 %   Neutro Abs 9.3 (*)  1.7 - 7.7 K/uL   Lymphocytes Relative 5 (*) 12 - 46 %   Lymphs Abs 0.5 (*) 0.7 - 4.0 K/uL   Monocytes Relative 1 (*) 3 - 12 %   Monocytes Absolute 0.1  0.1 - 1.0 K/uL   Eosinophils Relative 0  0 - 5 %   Eosinophils Absolute 0.0  0.0 - 0.7 K/uL   Basophils Relative 0  0 - 1 %   Basophils Absolute 0.0  0.0 - 0.1 K/uL  BASIC METABOLIC PANEL      Result Value Ref Range   Sodium 137  137 - 147 mEq/L   Potassium 3.5 (*) 3.7 - 5.3 mEq/L   Chloride 101  96 - 112 mEq/L   CO2 23  19 - 32 mEq/L   Glucose, Bld 117 (*) 70 - 99 mg/dL   BUN 11  6 - 23 mg/dL   Creatinine, Ser 0.75  0.50 - 1.10 mg/dL   Calcium 8.5  8.4 - 10.5 mg/dL   GFR calc non Af Amer 78 (*) >90 mL/min   GFR calc Af Amer >90  >90 mL/min  I-STAT TROPOININ, ED      Result Value Ref Range   Troponin i, poc 0.01  0.00 - 0.08 ng/mL   Comment 3            Dg Chest 2 View  08/02/2013   CLINICAL DATA:  Centralized chest pain and shortness of breath since last night.  EXAM: CHEST  2 VIEW  COMPARISON:  12/16/2010  FINDINGS: The heart size and mediastinal contours are within normal limits. Both lungs are clear. The visualized skeletal structures are unremarkable. Esophageal hiatal hernia behind the heart.  IMPRESSION: No active cardiopulmonary disease.   Electronically Signed   By: Lucienne Capers M.D.   On: 08/02/2013 06:56     EKG Interpretation   Date/Time:  Monday August 02 2013 06:11:28 EDT Ventricular Rate:   116 PR Interval:    QRS Duration: 86 QT Interval:  348 QTC Calculation: 483 R Axis:   13 Text Interpretation:  Atrial fibrillation Artifact Poor data quality in  current ECG precludes serial comparison Confirmed by [facility]  MD,  Nunzio Cory ([id number]) on 08/02/2013 6:25:31 AM      MDM   Final diagnoses:  Chest pain, unspecified chest pain type  Chronic a-fib   80 year old patient presents with chest pressure since this morning, ACS and workup. Dr. Aline Brochure also evaluate the patient during this encounter. ECHO 11/14: EF 55-60 0935 Reevaluation patient resting comfortably in room reports mild resolution with nitroglycerin and morphine. The reports persistent heaviness. Discussed work up in the ED and need for admission with the patient and her son. Consult to internal medicine for admission and chest pain rule out. Discussed pt history and condition with Dr. Nicki Reaper who agrees to admit the patient for further evaluation of Chest pain for Dr. Gwendlyn Deutscher. Meds given in ED:  Medications  nitroGLYCERIN (NITROSTAT) SL tablet 0.4 mg (0.4 mg Sublingual Given 08/02/13 0723)  sodium chloride 0.9 % bolus 500 mL (0 mLs Intravenous Stopped 08/02/13 0907)  morphine 2 MG/ML injection 2 mg (2 mg Intravenous Given 08/02/13 0752)    New Prescriptions   No medications on file        Lorrine Kin, PA-C 08/02/13 [phone]

## 2013-08-02 NOTE — H&P (Signed)
FMTS ATTENDING ADMISSION NOTE Sara Granlund,MD I  have seen and examined this patient, reviewed their chart. I have discussed this patient with the resident. I agree with the resident's findings, assessment and care plan.  80 Y/O F with PMX of Afib, HTN.HLD,DVT,PE presented to the hospital with hx of central chest pain that stated few hrs ago radiating to her left shoulder and neck, pain about 6/10 in severity. She denies associated SOB or cough. She denies prior episode of chest pain similar to this. Patient feels better after nitro and cardizem given in the ER.  No current facility-administered medications on file prior to encounter.   Current Outpatient Prescriptions on File Prior to Encounter  Medication Sig Dispense Refill  . apixaban (ELIQUIS) 5 MG TABS tablet Take 1 tablet (5 mg total) by mouth 2 (two) times daily.  60 tablet  6  . benazepril (LOTENSIN) 40 MG tablet Take 1 tablet (40 mg total) by mouth daily.  90 tablet  3  . calcium carbonate (TUMS - DOSED IN MG ELEMENTAL CALCIUM) 500 MG chewable tablet Chew 1 tablet by mouth 2 (two) times daily.        . calcium-vitamin D (SM CALCIUM-VITAMIN D) 500-200 MG-UNIT per tablet Take 1 tablet by mouth 2 (two) times daily.       Marland Kitchen diltiazem (CARDIZEM CD) 120 MG 24 hr capsule Take 1 capsule (120 mg total) by mouth daily.  90 capsule  3  . ferrous sulfate 325 (65 FE) MG tablet Take 1 tablet (325 mg total) by mouth daily with breakfast.  1 tablet  0  . metoprolol (LOPRESSOR) 100 MG tablet Take 1 tablet (100 mg total) by mouth 2 (two) times daily.  180 tablet  3  . omeprazole (PRILOSEC) 40 MG capsule Take 1 capsule (40 mg total) by mouth daily.  90 capsule  3  . simvastatin (ZOCOR) 20 MG tablet Take 1 tablet (20 mg total) by mouth daily.  90 tablet  3  . [DISCONTINUED] omeprazole (PRILOSEC OTC) 20 MG tablet Take 20 mg by mouth 2 (two) times daily.         Past Medical History  Diagnosis Date  . S/P dilatation of esophageal stricture 2002  .  Hypertension   . Hyperlipidemia   . DVT (deep venous thrombosis) 2009  . PE (pulmonary embolism) 2009  . GERD (gastroesophageal reflux disease)   . Diverticulosis   . Iron deficiency anemia   . Hyperplastic colon polyp   . Positive H. pylori test 01/20/96  . Degenerative joint disease   . Skin cancer     h/o basal and squamous skin cancer--> removed  . Persistent atrial fibrillation    Filed Vitals:   08/02/13 1000 08/02/13 1115 08/02/13 1133 08/02/13 1200  BP: 166/85  142/84 138/85  Pulse: 117  130 80  Temp:      TempSrc:      Resp: 21 32 28 37  SpO2: 96%  97% 99%   Exam: Gen: Calm in bed, not in distress. Neuro: Awake and alert, oriented x 3. Resp: Air entry equal and clear B/L Heart: S1 S2 normal, no murmur, rhythm irregularly irregular. Abd: Soft, NT/ND, BS+ and normal. Ext: No edema, mild pinkish coloration of her right first digit, no tenderness. Dorsalis pedis palpable on her right foot, less so on the left.  A/P: 80 Y/O F with: 1. Chest pain R/O ACS.     Troponin negative so far, continue to cycle.      EKG  reviewed negative for ischemic changes.      Monitor on telemetry.      ASA, Statin, Nitro.      Risk stratification labs.      Cardiology follow up and Lexiscan scheduled.  2. Afib with RVR:     EKG reviewed.     Cardizem drip from ER improved her rate.     Eliquis for anticoagulation.     Transition to oral Metoprolol.  3. HTN: BP stable on current regimen.     Monitor BP and adjust medication as needed.  4. Chronic conditions: Review home medication and continue as appropriate.

## 2013-08-02 NOTE — Consult Note (Signed)
CARDIOLOGY CONSULT NOTE       Patient ID: REAGYN FACEMIRE MRN: [id number] DOB/AGE: May 22, 1930 80 y.o.  Admit date: 08/02/2013 Referring Physician:  Hardie Shackleton Primary Physician: Zigmund Gottron, MD Primary Cardiologist:  Stanford Breed Reason for Consultation:  Chest Pain and afib with rapid rate  Active Problems:   Chest pain   Atrial fibrillation with RVR   HPI:   80 yo admitted by teaching servcie with chest pain and afib in rapid rate.  She is in permanent afib.  She has been on highdose beta blockade  Earlier in year cardizem added for better rate control  No history of CAD but no stress testing done.  She has been on eliquis with no missed doses.  She had been on coumadin back in 2002 for PE  She indicates DVT was not found and eventually came off blood thinners until last year for afib.  She awoke around 2:00 am unable to sleep Went to bathroom and had diaphoresis with pleuritic and positional type chest pain  Also with dyspnea and noted more rapid HR.  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  Initial nitro given in ER helped but f/u and MSO4 did not  No acute ischemic ECG changes and enzymes negative  CXR with normal mediastinum and no infiltrate  Sats have been fine  No LE edema or pain in legs  No fever , chills or signs of infection and felt fine 24 hrs ago  History of anemia on iron  GERD with esopageal dilatation and previous colitis with profound anemia.  Stools always dark due to iron   ROS All other systems reviewed and negative except as noted above  Past Medical History  Diagnosis Date  . S/P dilatation of esophageal stricture 2002  . Hypertension   . Hyperlipidemia   . DVT (deep venous thrombosis) 2009  . PE (pulmonary embolism) 2009  . GERD (gastroesophageal reflux disease)   . Diverticulosis   . Iron deficiency anemia   . Hyperplastic colon polyp   . Positive H. pylori test 01/20/96  . Degenerative joint disease   . Skin cancer     h/o  basal and squamous skin cancer--> removed  . Persistent atrial fibrillation     Family History  Problem Relation Age of Onset  . Esophageal cancer Brother   . Diverticulitis Brother   . Colon cancer Neg Hx   . Heart disease Mother   . Stroke Mother     History   Social History  . Marital Status: Widowed    Spouse Name: N/A    Number of Children: 3  . Years of Education: college   Occupational History  . retired Marine scientist    Social History Main Topics  . Smoking status: Former Smoker    Quit date: 02/11/1961  . Smokeless tobacco: Never Used  . Alcohol Use: 0.0 oz/week    7-14 Glasses of wine per week     Comment: 1-2 glasses of wine per day  . Drug Use: No  . Sexual Activity: Not Currently   Other Topics Concern  . Not on file   Social History Narrative   Health Care POA:    Emergency Contact: son, Keyri Salberg (c) [phone] [name])   End of Life Plan:    Who lives with you: self   Any pets: none   Diet: Pt has a varied diet of protein, starch, vegetables.   Exercise: Pt has no regular exercise  plan because of pain in back.   Seatbelts: Pt reports wearing seatbelt when in vehicle.   Nancy Fetter Exposure/Protection: Pt reports wearing sun screen.   Hobbies: reading, traveling, eating out.      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 Dilation and curettage of uterus      for endometrial polyps  . Replacement total knee      left  . Tonsillectomy    . Appendectomy    . Breast lumpectomy      benign     . nitroGLYCERIN  0.5 inch Topical 4 times per day   . diltiazem (CARDIZEM) infusion 10 mg/hr (08/02/13 1113)  . diltiazem      Physical Exam: Blood pressure 166/85, pulse 117, temperature 97.5 F (36.4 C), temperature source Oral, resp. rate 32, SpO2 96.00%.   Affect appropriate Healthy:  appears  stated age 32: normal Neck supple with no adenopathy JVP normal no bruits no thyromegaly Lungs clear with no wheezing and good diaphragmatic motion Heart:  S1/S2 no murmur, no rub, gallop or click PMI normal Abdomen: benighn, BS positve, no tenderness, no AAA no bruit.  No HSM or HJR Distal pulses intact with no bruits No edema Neuro non-focal Skin warm and dry No muscular weakness   Labs:   Lab Results  Component Value Date   WBC 9.9 08/02/2013   HGB 13.4 08/02/2013   HCT 38.6 08/02/2013   MCV 90.6 08/02/2013   PLT 320 08/02/2013    Recent Labs Lab 08/02/13 0836  NA 137  K 3.5*  CL 101  CO2 23  BUN 11  CREATININE 0.75  CALCIUM 8.5  GLUCOSE 117*   No results found for this basename: CKTOTAL, CKMB, CKMBINDEX, TROPONINI    Lab Results  Component Value Date   CHOL 195 05/18/2012   CHOL 187 05/14/2011   CHOL 171 05/09/2010   Lab Results  Component Value Date   HDL 82 05/18/2012   HDL 87 05/14/2011   HDL 74 05/09/2010   Lab Results  Component Value Date   LDLCALC 86 05/18/2012   LDLCALC 69 05/14/2011   LDLCALC 70 05/09/2010   Lab Results  Component Value Date   TRIG 136 05/18/2012   TRIG 154* 05/14/2011   TRIG 137 05/09/2010   Lab Results  Component Value Date   CHOLHDL 2.4 05/18/2012   CHOLHDL 2.1 05/14/2011   CHOLHDL 2.3 05/09/2010   Lab Results  Component Value Date   LDLDIRECT 85 08/18/2008      Radiology: Dg Chest 2 View  08/02/2013   CLINICAL DATA:  Centralized chest pain and shortness of breath since last night.  EXAM: CHEST  2 VIEW  COMPARISON:  12/16/2010  FINDINGS: The heart size and mediastinal contours are within normal limits. Both lungs are clear. The visualized skeletal structures are unremarkable. Esophageal hiatal hernia behind the heart.  IMPRESSION: No active cardiopulmonary disease.   Electronically Signed   By: Lucienne Capers M.D.   On: 08/02/2013 06:56    EKG:  Rapid afib with no ischemic changes   ASSESSMENT AND PLAN:  Afib:  Agree with  cardizem drip 24 hours and continue lopressor 100 bid.  In am change to cardizem 60 q6 with eye toward d/c on LA pill 255m/daily Cotinue Eliquis  TSH normal  2014 being repeated Chest Pain: Atypical features with positional and pleuritic component  No known CAD.  R/O CXR is normal no rub or signs of pericarditis.  Would be unusual to have recurrent PE on eliquis with no missed doses.  Check d dimer.  Tentatively plan lexiscan myovue in am if rate better controlled and r/o.  Would have to hold Elquis for at least a day before cath.  Add 81 mg ASA  HTN:  Continue ACE may have to lower dose if BP low on higher dose cardizem GERD:  Continue prilosec   Anemia:  Stable guaiac stools  Chol:  Continue statin   Signed: Jenkins Rouge 08/02/2013, 11:31 AM

## 2013-08-02 NOTE — ED Notes (Signed)
Pt reports that she has had the return of her chest pressure. Pt states that it is constant with occasional spasms.

## 2013-08-02 NOTE — H&P (Signed)
[facility] Admission History and Physical Service Pager: [phone]  Patient name: Sara Russell Medical record number: [id number] Date of birth: Nov 21, 1930 Age: 80 y.o. Gender: female  Primary Care Provider: Zigmund Gottron, MD Consultants: CARDS Code Status: FULL  Chief Complaint: Chest Pain  Assessment and Plan: Sara Russell is a 80 y.o. year old female presenting with chest pain . PMH is significant for HTN, HLD, AFib, Anemia, DVT/PE on Eliquis.  # Chest pain with newly dx A Fib with RVR: ACS evaluation.  Hx of HTN, HLD, Anemia.  ECHO in 12/2012 = EF 55-60%, PA Peak 28mHG.  Consider ischemic cause vs need for medication titration (also ?DVT but no sx and on Eliquis). - CARDIOLOGY Consult for consideration of ischemic eval - SDU given persistent left arm pain and RVR - Continue Morphine, Nitroglycerin Paste, Start Dilt drip - Cycles CEs, Risk Stratify - Continue home benazapril and metoprolol - continue home Statin but may need titration to higher potency  # Hx of DVT/PE/AFib - continue eliquis; likely not recurrent PE given on Eliquis; also will not change management so no further evaluation at this time.  # Hx of GERD: - continue PPI  FEN/GI: NPO until eval by CARD Prophylaxis: on Eliquis  Disposition: To SDU for Cardiac Evaluation  History of Present Illness: Sara GENTRYis a 80y.o. year old female presenting with acute onset of pressure that began early this morning.  Described as cinder blocks sitting on her chest with associated dyspnea, diaphoresis and pain radiating to left arm and left lateral neck.  No vomiting.  No orthostasis.  She reports overall prior to this she had been feeling well with the exception of mild heat intolerance over the past 1-2 weeks since the abnormally hot weather began.  Her pain today was relieved with Nitroglycerin and morphine provided in ED and by EMS.  S/p ASA and 3 sublingual nitroglycerin.     While sitting in ED noted to be in A.Fib with RVR.  Dx with Afib ~6 months ago and currently followed by Dr. ARayann Heman  No prior ischemic workup.  She notes her functional status has been mildly decreased since dx of Afib with increased fatigue while doing house chores but no changes in past 1-2 months.  Significant knee and ankle orthopedic issues interfere with her mobility at baseline.     Review Of Systems: Per HPI with the following additions: Prior to last night No melana, hematachemia.  No dysuria, incontinence or urinary frequency.  No fevers, chills.  No prior chest pain, tachypalpitations, shortness of breath/DOE, orthopnea/PND, LE swelling.   Otherwise 12 point review of systems was performed and was unremarkable.  Patient Active Problem List   Diagnosis Date Noted  . Chest pain 08/02/2013  . Atrial fibrillation with RVR 08/02/2013  . Acute upper respiratory infections of unspecified site 01/21/2013  . Left atrial enlargement 01/10/2013  . A-fib 12/08/2012  . Rosacea 05/15/2012  . Breast cancer screening 03/14/2011  . Anemia 08/28/2010  . Grief 04/29/2010  . DEGENERATIVE DISC DISEASE, LUMBAR SPINE 07/14/2009  . DVT 07/11/2009  . GERD 07/11/2009  . PERSONAL HX COLONIC POLYPS 07/11/2009  . HYPERCHOLESTEROLEMIA 04/10/2006  . HYPERTENSION, BENIGN SYSTEMIC 04/10/2006  . PULMONARY EMBOLI 04/10/2006  . DIVERTICULOSIS OF COLON 04/10/2006  . ACTINIC KERATOSIS 04/10/2006  . GEN OSTEOARTHROSIS INVOLVING MULTIPLE SITES 04/10/2006  . OSTEOPENIA 04/10/2006   Past Medical History: Past Medical History  Diagnosis Date  . S/P dilatation of esophageal  stricture 2002  . Hypertension   . Hyperlipidemia   . DVT (deep venous thrombosis) 2009  . PE (pulmonary embolism) 2009  . GERD (gastroesophageal reflux disease)   . Diverticulosis   . Iron deficiency anemia   . Hyperplastic colon polyp   . Positive H. pylori test 01/20/96  . Degenerative joint disease   . Skin cancer     h/o  basal and squamous skin cancer--> removed  . Persistent atrial fibrillation    Past Surgical History: Past Surgical History  Procedure Laterality Date  . Dilation and curettage of uterus      for endometrial polyps  . Replacement total knee      left  . Tonsillectomy    . Appendectomy    . Breast lumpectomy      benign   Social History: History  Substance Use Topics  . Smoking status: Former Smoker    Quit date: 02/11/1961  . Smokeless tobacco: Never Used  . Alcohol Use: 0.0 oz/week    7-14 Glasses of wine per week     Comment: 1-2 glasses of wine per day   History   Social History  . Marital Status: Widowed    Spouse Name: N/A    Number of Children: 3  . Years of Education: college   Occupational History  . retired Marine scientist    Social History Main Topics  . Smoking status: Former Smoker    Quit date: 02/11/1961  . Smokeless tobacco: Never Used  . Alcohol Use: 0.0 oz/week    7-14 Glasses of wine per week     Comment: 1-2 glasses of wine per day  . Drug Use: No  . Sexual Activity: Not Currently   Other Topics Concern  . None   Social History Narrative   Health Care POA:    Emergency Contact: son, Jelina Paulsen (c) [phone] [name])   End of Life Plan:    Who lives with you: self   Any pets: none   Diet: Pt has a varied diet of protein, starch, vegetables.   Exercise: Pt has no regular exercise plan because of pain in back.   Seatbelts: Pt reports wearing seatbelt when in vehicle.   Nancy Fetter Exposure/Protection: Pt reports wearing sun screen.   Hobbies: reading, traveling, eating out.      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 Esophageal cancer Brother   . Diverticulitis Brother   . Colon cancer Neg Hx   . Heart disease Mother   . Stroke Mother    Allergies and  Medications: Allergies  Allergen Reactions  . Iohexol Other (See Comments)    unknown  . Ivp Dye [Iodinated Diagnostic Agents] Hives  . Scallops [Shellfish Allergy] Other (See Comments)    Projectile vomiting.  . Sulfa Antibiotics Hives  . Sulfamethoxazole Hives    REACTION: unspecified   No current facility-administered medications on file prior to encounter.   Current Outpatient Prescriptions on File Prior to Encounter  Medication Sig Dispense Refill  . apixaban (ELIQUIS) 5 MG TABS tablet Take 1 tablet (5 mg total) by mouth 2 (two) times daily.  60 tablet  6  . benazepril (LOTENSIN) 40 MG tablet Take 1 tablet (40 mg total) by mouth daily.  90 tablet  3  . calcium  carbonate (TUMS - DOSED IN MG ELEMENTAL CALCIUM) 500 MG chewable tablet Chew 1 tablet by mouth 2 (two) times daily.        . calcium-vitamin D (SM CALCIUM-VITAMIN D) 500-200 MG-UNIT per tablet Take 1 tablet by mouth 2 (two) times daily.       Marland Kitchen diltiazem (CARDIZEM CD) 120 MG 24 hr capsule Take 1 capsule (120 mg total) by mouth daily.  90 capsule  3  . ferrous sulfate 325 (65 FE) MG tablet Take 1 tablet (325 mg total) by mouth daily with breakfast.  1 tablet  0  . metoprolol (LOPRESSOR) 100 MG tablet Take 1 tablet (100 mg total) by mouth 2 (two) times daily.  180 tablet  3  . omeprazole (PRILOSEC) 40 MG capsule Take 1 capsule (40 mg total) by mouth daily.  90 capsule  3  . simvastatin (ZOCOR) 20 MG tablet Take 1 tablet (20 mg total) by mouth daily.  90 tablet  3  . [DISCONTINUED] omeprazole (PRILOSEC OTC) 20 MG tablet Take 20 mg by mouth 2 (two) times daily.          Objective: BP 166/85  Pulse 117  Temp(Src) 97.5 F (36.4 C) (Oral)  Resp 21  SpO2 96% Exam: GENERAL:  Adult Elderly caucasian  female. In no discomfort; no respiratory distress PSYCH:  alert and appropriate, good insight  HNEENT:  mmm, no JVD CARDIAC:  Tachy irregularrly irregular, no murmur appreciated LUNGS:  CTA B, no wheezes, no crackles EXTREM:   Warm, well perfused.  Moves all 4 extremities spontaneously; no lateralization.  Pedal pulses 2/4 in right foot; 1/4 in left - well healed significant orthopedic scars on medial aspect of left ankle..  Trace Bilateral R<L pretibial edema.  EKG: 6/22 Rate & Rhythm:A Fib - rate regular Intervals:  normal Other:   Occasional PVC, no ischemia, no ST changes  Of note on TELE A Fib with RVR to 130s -150s sustained during exam  LABS:  Basic Labs Extended:   Recent Labs Lab 08/02/13 0710  WBC 9.9  HGB 13.4  HCT 38.6  PLT 320     Recent Labs Lab 08/02/13 0836  NA 137  K 3.5*  CL 101  CO2 23  BUN 11  CREATININE 0.75  GLUCOSE 117*  CALCIUM 8.5   No results found for this basename: ALBUMIN, PROTEIN, ALT, AST, ALKPHOS, BILITOT, MG, LIPASE,  in the last 168 hours  Recent Labs  12/10/12 1041  TSH 1.943     Recent Labs Lab 08/02/13 0721  TROPIPOC 0.01      IMAGING: 6/22 - CXR - no active CP disease   Gerda Diss, DO 08/02/2013, 10:50 AM PGY-3, [place] [facility] pager: [phone], text pages welcome

## 2013-08-02 NOTE — ED Notes (Signed)
Pt started having substernal chest pain when she woke this morning, around 0400. Pt c/o SOB, and EMS reports the pt was diaphoretic upon their arrival. Pt with hx of a-fib. Pt placed on O2 for comfort. Pt received 1 NTG en route with slight relief, and 4 mg of Zofran. Pt became chilled en route, and continues to shiver at this time.

## 2013-08-02 NOTE — ED Notes (Signed)
EKG completed @ 6:11, Sharee Pimple, NT completed another EKG & provided copy to MD

## 2013-08-02 NOTE — ED Provider Notes (Signed)
Medical screening examination/treatment/procedure(s) were conducted as a shared visit with non-physician practitioner(s) and myself.  I personally evaluated the patient during the encounter.   EKG Interpretation   Date/Time:  Monday August 02 2013 09:10:01 EDT Ventricular Rate:  94 PR Interval:    QRS Duration: 80 QT Interval:  359 QTC Calculation: 449 R Axis:   23 Text Interpretation:  Atrial fibrillation Paired ventricular premature  complexes Borderline T abnormalities, inferior leads Confirmed by Sharica Roedel   MD, Katia Hannen ([id number]) on 08/02/2013 9:17:11 AM      I interviewed and examined the patient. Lungs are CTAB. Cardiac exam wnl, in afib. Abdomen soft.  Pt had mild relief w/ nitro. Will try small dose of morphine. Plan on admission for cp r/o.   Blanchard Kelch, MD 08/02/13 [phone]

## 2013-08-03 ENCOUNTER — Inpatient Hospital Stay (HOSPITAL_COMMUNITY): Payer: Medicare Other

## 2013-08-03 DIAGNOSIS — R079 Chest pain, unspecified: Secondary | ICD-10-CM | POA: Diagnosis not present

## 2013-08-03 DIAGNOSIS — I4891 Unspecified atrial fibrillation: Secondary | ICD-10-CM | POA: Diagnosis not present

## 2013-08-03 LAB — TROPONIN I: Troponin I: <0.3 ng/mL (ref <0.30)

## 2013-08-03 LAB — BASIC METABOLIC PANEL
BUN: 16 mg/dL (ref 6–23)
CO2: 20 mEq/L (ref 19–32)
Calcium: 8.6 mg/dL (ref 8.4–10.5)
Chloride: 95 mEq/L — ABNORMAL LOW (ref 96–112)
Creatinine, Ser: 0.78 mg/dL (ref 0.50–1.10)
GFR calc Af Amer: 89 mL/min — ABNORMAL LOW (ref >90)
GFR calc non Af Amer: 77 mL/min — ABNORMAL LOW (ref >90)
Glucose, Bld: 106 mg/dL — ABNORMAL HIGH (ref 70–99)
Potassium: 5.1 mEq/L (ref 3.7–5.3)
Sodium: 131 mEq/L — ABNORMAL LOW (ref 137–147)

## 2013-08-03 MED ORDER — DILTIAZEM HCL 60 MG PO TABS
60.0000 mg | ORAL_TABLET | Freq: Four times a day (QID) | ORAL | Status: DC
  Administered 2013-08-03 – 2013-08-04 (×4): 60 mg via ORAL
  Filled 2013-08-03 (×8): qty 1

## 2013-08-03 MED ORDER — SODIUM CHLORIDE 0.9 % IV SOLN: INTRAVENOUS | Status: DC | PRN

## 2013-08-03 MED ORDER — SENNOSIDES-DOCUSATE SODIUM 8.6-50 MG PO TABS
1.0000 | ORAL_TABLET | Freq: Two times a day (BID) | ORAL | Status: DC
  Administered 2013-08-03 – 2013-08-06 (×5): 1 via ORAL
  Filled 2013-08-03 (×8): qty 1

## 2013-08-03 MED ORDER — BENAZEPRIL HCL 20 MG PO TABS
20.0000 mg | ORAL_TABLET | Freq: Every day | ORAL | Status: DC
  Administered 2013-08-04 – 2013-08-06 (×3): 20 mg via ORAL
  Filled 2013-08-03 (×4): qty 1

## 2013-08-03 MED ORDER — TRAMADOL HCL 50 MG PO TABS
50.0000 mg | ORAL_TABLET | Freq: Four times a day (QID) | ORAL | Status: DC | PRN
  Administered 2013-08-03 – 2013-08-05 (×6): 50 mg via ORAL
  Filled 2013-08-03 (×6): qty 1

## 2013-08-03 MED ORDER — REGADENOSON 0.4 MG/5ML IV SOLN
INTRAVENOUS | Status: AC
  Administered 2013-08-03: 0.4 mg via INTRAVENOUS
  Filled 2013-08-03: qty 5

## 2013-08-03 MED ORDER — BIOTENE DRY MOUTH MT LIQD
15.0000 mL | Freq: Two times a day (BID) | OROMUCOSAL | Status: DC
Start: 2013-08-03 — End: 2013-08-06
  Administered 2013-08-03 – 2013-08-06 (×4): 15 mL via OROMUCOSAL

## 2013-08-03 MED ORDER — TECHNETIUM TC 99M SESTAMIBI - CARDIOLITE
30.0000 | Freq: Once | INTRAVENOUS | Status: AC | PRN
  Administered 2013-08-03: 11:00:00 30 via INTRAVENOUS

## 2013-08-03 MED ORDER — BENAZEPRIL HCL 20 MG PO TABS
20.0000 mg | ORAL_TABLET | Freq: Once | ORAL | Status: AC
  Administered 2013-08-03: 20 mg via ORAL
  Filled 2013-08-03: qty 1

## 2013-08-03 MED ORDER — TECHNETIUM TC 99M SESTAMIBI GENERIC - CARDIOLITE
10.0000 | Freq: Once | INTRAVENOUS | Status: AC | PRN
Start: 2013-08-03 — End: 2013-08-03
  Administered 2013-08-03: 10 via INTRAVENOUS

## 2013-08-03 MED ORDER — MAGNESIUM HYDROXIDE 400 MG/5ML PO SUSP
15.0000 mL | Freq: Once | ORAL | Status: AC
  Administered 2013-08-03: 15 mL via ORAL
  Filled 2013-08-03: qty 30

## 2013-08-03 NOTE — Progress Notes (Signed)
Patient much better today, denies any chest pain, anticipating her stress test. She did mentioned her breath is shallow with ambulation to the bathroom, otherwise, no wheezing,cough or chest tightness. She denies any palpitations.  Filed Vitals:   08/03/13 1120 08/03/13 1122 08/03/13 1205 08/03/13 1345  BP:   135/89   Pulse:   115   Temp:      TempSrc:      Resp:      Height:      Weight:      SpO2: 89% 95% 94% 94%   Exam: Gen: Not in distress, calm in bed. Neuro: Awake and alert, oriented x 3. Heart: S1 S2 normal, no murmur, irregularly irregular. Rate not rapid. Resp: Air entry equal B/L, no wheezing or crackles. Abd: Soft, NT/ND. BS + and normal. Ext: No edema.  A/P: 80 Y/O F with  1. Chest pain R/O ACS: MI r/o with negative troponin and no EKG changes.     Stress test reviewed and was normal.     ASA 81 mg qd,Zocor 20 mg qd and Benazepril 20 mg qd.  2. Afib + RVR: Rate normal now.     Patient doing well on Cardizem and Metoprolol.     On Eliquis as well.     Cardiology to follow up with patient.  3. SOB: May be related to her Afib.     Lung exam benign.     Monitor O2 requirement.     Chest xray reviewed was normal from yesterday.  Disposition: Continue to monitor for improvement. Possible D/C once cleared by cards.

## 2013-08-03 NOTE — Progress Notes (Addendum)
Lexiscan Cardiolite performed.  Pt had no chest pain. Will d/c NTG paste.

## 2013-08-03 NOTE — Discharge Summary (Signed)
[facility] Discharge Summary  Patient name: Sara Russell Medical record number: [id number] Date of birth: 15-Feb-1930 Age: 80 y.o. Gender: female Date of Admission: 08/02/2013  Date of Discharge: 08/06/2013 Admitting Physician: Andrena Mews, MD  Primary Care Provider: Zigmund Gottron, MD Consultants: Cardiology  Indication for Hospitalization: Chest pain  Discharge Diagnoses/Problem List:  Chest pain Shortness of breath Hyponatremia Atrial fibrillation Hiatal hernia GERD  Disposition: Discharge home with home health PT  Discharge Condition: Stable  Discharge Exam: General: Laying in bed, in no acute distress, son at bedside  Cardiovascular: Regular rate, irregular rhythm, no murmur, intact pulses  Respiratory: Diffuse expiratory wheezing, good air movement bilaterally  Abdomen: Soft, non-tender, non-distended  Extremities: No cyanosis, no tenderness or swelling  Neuro: alert and oriented x3  Brief Hospital Course:   HPI: Sara Russell is a 80 y.o. year old female presenting with acute onset of pressure that began early this morning. Described as cinder blocks sitting on her chest with associated dyspnea, diaphoresis and pain radiating to left arm and left lateral neck. No vomiting. No orthostasis. She reports overall prior to this she had been feeling well with the exception of mild heat intolerance over the past 1-2 weeks since the abnormally hot weather began. Her pain today was relieved with Nitroglycerin and morphine provided in ED and by EMS. S/p ASA and 3 sublingual nitroglycerin.  While sitting in ED noted to be in A.Fib with RVR. Dx with Afib ~6 months ago and currently followed by Dr. [name]. No prior ischemic workup. She notes her functional status has been mildly decreased since dx of Afib with increased fatigue while doing house chores but no changes in past 1-2 months. Significant knee and ankle orthopedic issues interfere with  her mobility at baseline.   Course:   Chest pain: Cardiology consulted. Chest pain resolved. Patient went for nuclear stress test which showed 85% EF and no ischemia. Troponin negative x3. EKG showed no signs of ischemia. Risk stratification labs within normal limits.  Shortness of breath: Patient developed an O2 requirement, decreased exercise tolerance and cough. With prior smoking history, could have been COPD exacerbation, although no formal diagnosis prior. Patient given duonebs, which did not help much. On exam, she was euvolemic except for bibasilar crackles. With stress test suggesting diastolic dysfunction, gave trial of lasix 19m IV x1, which gave great relief. Repeat x-ray showed possible LLL pneumonia vs atelectasis. Patient started on azithromycin for treatment. Patient's oxygen requirement resolved before discharge.  Atrial fibrillation: patient started on diltiazem drip. She was then transitioned to diltiazem 639mQID and then diltiazem 24hr 24065mOn this titrated dose, patient experienced some bradycardia down to 40s and was titrated down to diltiazem 24hr 180m65mfore discharge  Hyponatremia: patient chronically hyponatremic and seems to hover around 130. Patient remained stable around 126 even after trial of lasix. No symptoms  Hiatal hernia: incidental finding on chest x-ray. Patient experienced no symptoms  GERD: continued PPI  Issues for Follow Up:  1. Finish course of antibiotics 2. Follow-up with cardiology 3. New ?HFpEF. Cardiology did not treat as such. May be something to keep in mind. 4. Recommend to schedule for PFTs outpatient 5. Hiatal hernia on chest x-ray. Asymptomatic. If causes problems, would recommend GI referral for barium swallow/upper endoscopy 6. Repeat Bmet to follow-up hyponatremia  Significant Procedures:  1. Nuclear stress test: EF 85%. No ischemia  Significant Labs and Imaging:   Recent Labs Lab 08/02/13 0710  WBC 9.9  HGB 13.4  HCT  38.6  PLT 320    Recent Labs Lab 08/03/13 1245 08/04/13 0250 08/04/13 1350 08/05/13 0647 08/06/13 0345  NA 131* 129* 126* 127* 126*  K 5.1 4.2 4.6 3.6* 3.4*  CL 95* 92* 90* 89* 85*  CO2 20 20 21 24 25   GLUCOSE 106* 127* 113* 102* 93  BUN 16 16 15 12 11   CREATININE 0.78 0.76 0.66 0.57 0.64  CALCIUM 8.6 8.4 8.8 9.0 8.6   Risk Stratification Labs  TSH    Component Value Date/Time   TSH 1.240 08/02/2013 1432   Hemoglobin A1C    Component Value Date/Time   HGBA1C 5.3 08/02/2013 1431   Lipid Panel     Component Value Date/Time   CHOL 103 08/02/2013 1432   TRIG 76 08/02/2013 1432   HDL 58 08/02/2013 1432   CHOLHDL 1.8 08/02/2013 1432   VLDL 15 08/02/2013 1432   LDLCALC 30 08/02/2013 1432    Dg Chest 2 View  08/02/2013   CLINICAL DATA:  Centralized chest pain and shortness of breath since last night.  EXAM: CHEST  2 VIEW  COMPARISON:  12/16/2010  FINDINGS: The heart size and mediastinal contours are within normal limits. Both lungs are clear. The visualized skeletal structures are unremarkable. Esophageal hiatal hernia behind the heart.  IMPRESSION: No active cardiopulmonary disease.   Electronically Signed   By: Lucienne Capers M.D.   On: 08/02/2013 06:56   Nm Myocar Multi W/spect W/wall Motion / Ef  08/03/2013   CLINICAL DATA:  Chest pain history atrial fibrillation with rapid ventricular response, hypertension, hyperlipidemia, GERD, history DVT  EXAM: MYOCARDIAL IMAGING WITH SPECT (REST AND PHARMACOLOGIC-STRESS)  GATED LEFT VENTRICULAR WALL MOTION STUDY  LEFT VENTRICULAR EJECTION FRACTION  TECHNIQUE: Standard myocardial SPECT imaging was performed after resting intravenous injection of 10 mCi Tc-32msestamibi. Subsequently, intravenous infusion of Lexiscan was performed under the supervision of the Cardiology staff. At peak effect of the drug, 30 mCi Tc-957mestamibi was injected intravenously and standard myocardial SPECT imaging was performed. Quantitative gated imaging was also  performed to evaluate left ventricular wall motion, and estimate left ventricular ejection fraction.  COMPARISON:  Chest radiograph - 08/02/2013; 01/14/2008  FINDINGS: Review of the rotational raw images demonstrates no significant breast or chest wall attenuation. No significant patient motion artifact.  SPECT imaging demonstrates homogeneous distribution of injected radiotracer without scintigraphic evidence of prior infarction pharmacologically induced ischemia.  Quantitative gated analysis demonstrates normal wall motion.  The resting left ventricular ejection fraction is 8548%ith end-diastolic volume of 45 ml and end-systolic volume of 7 ml.  IMPRESSION: 1. No scintigraphic evidence of prior infarction or pharmacologically induced ischemia. 2. Normal wall motion.  Ejection fraction - 85%.   Electronically Signed   By: JoSandi Mariscal.D.   On: 08/03/2013 12:38   Dg Chest 2 View  08/05/2013   CLINICAL DATA:  Chest pain for 4 days  EXAM: CHEST  2 VIEW  COMPARISON:  08/02/2013  FINDINGS: Left lower lobe hazy airspace disease which may reflect atelectasis versus pneumonia. Linear right lower lung airspace disease likely reflecting discoid atelectasis. No pleural effusion or pneumothorax. Stable cardiomediastinal silhouette.  Large hiatal hernia.  The osseous structures are unremarkable.  IMPRESSION: Hazy left lower lobe airspace disease which may reflect atelectasis versus pneumonia.   Electronically Signed   By: HeKathreen Devoid On: 08/05/2013 21:48    Results/Tests Pending at Time of Discharge: None  Discharge Medications:    Medication List  STOP taking these medications       simvastatin 20 MG tablet  Commonly known as:  ZOCOR      TAKE these medications       albuterol 108 (90 BASE) MCG/ACT inhaler  Commonly known as:  PROVENTIL HFA;VENTOLIN HFA  Inhale 2-4 puffs into the lungs every 6 (six) hours as needed for wheezing or shortness of breath.     apixaban 5 MG Tabs tablet  Commonly known  as:  ELIQUIS  Take 1 tablet (5 mg total) by mouth 2 (two) times daily.     aspirin 81 MG chewable tablet  Chew 1 tablet (81 mg total) by mouth daily.     atorvastatin 10 MG tablet  Commonly known as:  LIPITOR  Take 1 tablet (10 mg total) by mouth daily at 6 PM.     azithromycin 250 MG tablet  Commonly known as:  ZITHROMAX  Take 2 tablets (500 mg total) by mouth daily.     benazepril 40 MG tablet  Commonly known as:  LOTENSIN  Take 1 tablet (40 mg total) by mouth daily.     calcium carbonate 500 MG chewable tablet  Commonly known as:  TUMS - dosed in mg elemental calcium  Chew 1 tablet by mouth 2 (two) times daily.     diltiazem 180 MG 24 hr capsule  Commonly known as:  CARDIZEM CD  Take 1 capsule (180 mg total) by mouth daily.     ferrous sulfate 325 (65 FE) MG tablet  Take 1 tablet (325 mg total) by mouth daily with breakfast.     metoprolol 100 MG tablet  Commonly known as:  LOPRESSOR  Take 1 tablet (100 mg total) by mouth 2 (two) times daily.     nitroGLYCERIN 0.4 MG SL tablet  Commonly known as:  NITROSTAT  Place 0.4 mg under the tongue every 5 (five) minutes as needed for chest pain.     omeprazole 40 MG capsule  Commonly known as:  PRILOSEC  Take 1 capsule (40 mg total) by mouth daily.     SM CALCIUM-VITAMIN D 500-200 MG-UNIT per tablet  Generic drug:  calcium-vitamin D  Take 1 tablet by mouth 2 (two) times daily.     traMADol 50 MG tablet  Commonly known as:  ULTRAM  Take 50 mg by mouth at bedtime.        Discharge Instructions: Please refer to Patient Instructions section of EMR for full details.  Patient was counseled important signs and symptoms that should prompt return to medical care, changes in medications, dietary instructions, activity restrictions, and follow up appointments.   Follow-Up Appointments: Follow-up Information   Follow up with Ermalinda Barrios, PA-C On 08/16/2013. (2:30 pm)    Specialty:  Cardiology   Contact information:   [address] [place] [address] [phone]       Follow up with Vance Gather, MD On 08/10/2013. (3:30PM, For hospital follow-up)    Specialty:  Family Medicine   Contact information:   [place] [address] [phone]       Cordelia Poche, MD 08/08/2013, 7:15 AM PGY-1, [place]

## 2013-08-03 NOTE — Progress Notes (Signed)
Notified MD on call of pt c/o constipation, back pain, and SOB after getting back to bed from [facility].  Pt. Voided only 100cc dark amber urine since last pm at 2200 per pt report.  Pt. With audible wheeze and coarse crackles in bilateral bases.  O2 sats=89-90% on 2L O2.  Will monitor closely until MD to see.

## 2013-08-03 NOTE — Progress Notes (Signed)
FMTS ATTENDING  NOTE Morgane Joerger,MD I  have seen and examined this patient, reviewed their chart. I have discussed this patient with the resident. I agree with the resident's findings, assessment and care plan. 

## 2013-08-03 NOTE — Progress Notes (Signed)
     SUBJECTIVE: Feels better. No chest pain this am. I saw her in Nuclear Med Dept.   BP 111/65  Pulse 85  Temp(Src) 98.5 F (36.9 C) (Oral)  Resp 18  Ht 5' 2.5" (1.588 m)  Wt 165 lb 2 oz (74.9 kg)  BMI 29.70 kg/m2  SpO2 91%  Intake/Output Summary (Last 24 hours) at 08/03/13 0916 Last data filed at 08/03/13 0900  Gross per 24 hour  Intake 1507.67 ml  Output    400 ml  Net 1107.67 ml    PHYSICAL EXAM General: Well developed, well nourished, in no acute distress. Alert and oriented x 3.  Psych:  Good affect, responds appropriately Neck: No JVD. No masses noted.  Lungs: Clear bilaterally with no wheezes or rhonci noted.  Heart: Irreg irreg with no murmurs noted. Abdomen: Bowel sounds are present. Soft, non-tender.  Extremities: No lower extremity edema.   LABS: Basic Metabolic Panel:  Recent Labs  08/02/13 0836  NA 137  K 3.5*  CL 101  CO2 23  GLUCOSE 117*  BUN 11  CREATININE 0.75  CALCIUM 8.5   CBC:  Recent Labs  08/02/13 0710  WBC 9.9  NEUTROABS 9.3*  HGB 13.4  HCT 38.6  MCV 90.6  PLT 320   Cardiac Enzymes:  Recent Labs  08/02/13 1431 08/02/13 2016 08/03/13 0157  TROPONINI <0.30 <0.30 <0.30   Fasting Lipid Panel:  Recent Labs  08/02/13 1432  CHOL 103  HDL 58  LDLCALC 30  TRIG 76  CHOLHDL 1.8    Current Meds: . apixaban  5 mg Oral BID  . aspirin  81 mg Oral Daily  . benazepril  40 mg Oral Daily  . metoprolol  100 mg Oral BID  . nitroGLYCERIN  0.5 inch Topical 4 times per day  . pantoprazole  40 mg Oral Daily  . regadenoson      . regadenoson  0.4 mg Intravenous Once  . simvastatin  20 mg Oral q1800  . traMADol  50 mg Oral QHS    ASSESSMENT AND PLAN:  1. Atrial fib with RVR:  Rate controlled on po beta blocker and IV Cardizem. Will change to po diltiazem today. Will start Dilitazem 60 mg po Q6 hours. She is on Eliquis for anticoagulation. If she tolerates the po diltiazem, will change to long acting tomorrow. Will lower dose  benazepril to 20 mg per day.   2. Chest pain: Troponin negative x 3. Stress myoview this am. Her pain is pleuritic.   Dayn Barich  6/23/20159:16 AM

## 2013-08-03 NOTE — Progress Notes (Signed)
Primary physician note:  I am the continuity physician for Sara Russell and appreciate the excellent care by the FPTS and our cardiology consultants.  She has an interesting constellation of chest pain, shortness of breath and a fib.  I am not certain that one illness explains all these symptoms.  My best synopsis would be: 1. Chest pain:  Unlikely cardiac with normal nuclear med stress test.  Could be esophageal spasm - she has a hx of esophageal stricture and went off her home omeprazole one week before admit and pain started when she was supine.  Pulmonary is inviting - her symptoms of pain and SOB would be explained nicely by PE, but highly unlikely given chronic anticoag.  Could she have a viral pleurisy? 2. SOB;  Given EF of 85% on nuclear med stress test, I think diastolic dysfunction CHF is possible.  She also could have some temporary CHF when she was in A Fib with RVR.  Pleurisy would explain, maybe.  Esophageal spasm does not induce SOB except through anxiety and Sara Russell is not the anxious type. 3. Afib with RVR.  Episodic.  On anticoag.  Chest pain was maximal when she had RVR.  Agree with another night in the hospital.  Definitely needs PPI or H2 Blocker at DC.  Viral pleurisy should clear on its own.  Monitor weights for possibility of diastolic CHF.  Awaits cards thoughts after reviewing stress test.

## 2013-08-03 NOTE — Progress Notes (Signed)
Cardiazem gtt ran out, attempted to hang new bag in [facility] but order has been discontinued.  Card stopped.

## 2013-08-03 NOTE — Progress Notes (Signed)
Family Medicine Teaching Service Daily Progress Note Intern Pager: [phone]  Patient name: Sara Russell Medical record number: [id number] Date of birth: 1930/04/05 Age: 80 y.o. Gender: female  Primary Care Provider: Zigmund Gottron, MD Consultants: Cardiology Code Status: Full code  Pt Overview and Major Events to Date:  6/22: patient admitted  Assessment and Plan: Sara Russell is a 80 y.o. year old female presenting with chest pain . PMH is significant for HTN, HLD, AFib, Anemia, DVT/PE on Eliquis.   # Chest pain: resolved. s/p myoview today - Cardiology recommendations  - myoview today, follow-up results  - morphine as needed for pain - continue benazapril and metoprolol - risk stratification labs (TSH, lipid profile, A1C) wnl - troponin negative x3 - continue home statin - wean to room air  # Atrial fibrillation with history of DVT and PE: currently not in RVR with pulse in 90s-100s. Currently on diltiazem drip. Currently on anticoagulation - Switch to diltiazem PO per cardiology recommendations with plan to switch to long acting tomorrow - continue metoprolol - continue Eliquis  # Hx of GERD:  - continue PPI   FEN/GI: Heart healthy, KVO Prophylaxis: Eliquis  Disposition: Discharge home pending ischemic workup  Subjective:  Patient states she has had some shortness of breath with ambulation which was improved with oxygen. Otherwise, no chest pain, no palpitations.   Objective: Temp:  [97.5 F (36.4 C)-101.5 F (38.6 C)] 98.5 F (36.9 C) (06/23 0700) Pulse Rate:  [38-130] 62 (06/23 0700) Resp:  [11-43] 18 (06/23 0700) BP: (85-166)/(37-87) 97/58 mmHg (06/23 0700) SpO2:  [91 %-100 %] 95 % (06/23 0700) Weight:  [161 lb 9.6 oz (73.3 kg)-165 lb 2 oz (74.9 kg)] 165 lb 2 oz (74.9 kg) (06/23 0413)  Physical Exam: General: Laying in bed, in no acute distress, son's at bedside Cardiovascular: Regular rate, irregular rhythm, no murmur Respiratory: Bibasilar  crackles, decreased breath sounds bilaterally due to poor effort Abdomen: Soft, non-tender, non-distended Extremities: No cyanosis, no tenderness or swelling  Laboratory:  Recent Labs Lab 08/02/13 0710  WBC 9.9  HGB 13.4  HCT 38.6  PLT 320    Recent Labs Lab 08/02/13 0836 08/03/13 1245  NA 137 131*  K 3.5* 5.1  CL 101 95*  CO2 23 20  BUN 11 16  CREATININE 0.75 0.78  CALCIUM 8.5 8.6  GLUCOSE 117* 106*   Risk Stratification Labs  TSH    Component Value Date/Time   TSH 1.240 08/02/2013 1432   Hemoglobin A1C    Component Value Date/Time   HGBA1C 5.3 08/02/2013 1431   Lipid Panel     Component Value Date/Time   CHOL 103 08/02/2013 1432   TRIG 76 08/02/2013 1432   HDL 58 08/02/2013 1432   CHOLHDL 1.8 08/02/2013 1432   VLDL 15 08/02/2013 1432   LDLCALC 30 08/02/2013 1432   Imaging/Diagnostic Tests: Dg Chest 2 View  08/02/2013   CLINICAL DATA:  Centralized chest pain and shortness of breath since last night.  EXAM: CHEST  2 VIEW  COMPARISON:  12/16/2010  FINDINGS: The heart size and mediastinal contours are within normal limits. Both lungs are clear. The visualized skeletal structures are unremarkable. Esophageal hiatal hernia behind the heart.  IMPRESSION: No active cardiopulmonary disease.   Electronically Signed   By: Lucienne Capers M.D.   On: 08/02/2013 06:56   Nm Myocar Multi W/spect W/wall Motion / Ef  08/03/2013   CLINICAL DATA:  Chest pain history atrial fibrillation with rapid ventricular response, hypertension, hyperlipidemia,  GERD, history DVT  EXAM: MYOCARDIAL IMAGING WITH SPECT (REST AND PHARMACOLOGIC-STRESS)  GATED LEFT VENTRICULAR WALL MOTION STUDY  LEFT VENTRICULAR EJECTION FRACTION  TECHNIQUE: Standard myocardial SPECT imaging was performed after resting intravenous injection of 10 mCi Tc-19msestamibi. Subsequently, intravenous infusion of Lexiscan was performed under the supervision of the Cardiology staff. At peak effect of the drug, 30 mCi Tc-98msestamibi was injected intravenously and standard myocardial SPECT imaging was performed. Quantitative gated imaging was also performed to evaluate left ventricular wall motion, and estimate left ventricular ejection fraction.  COMPARISON:  Chest radiograph - 08/02/2013; 01/14/2008  FINDINGS: Review of the rotational raw images demonstrates no significant breast or chest wall attenuation. No significant patient motion artifact.  SPECT imaging demonstrates homogeneous distribution of injected radiotracer without scintigraphic evidence of prior infarction pharmacologically induced ischemia.  Quantitative gated analysis demonstrates normal wall motion.  The resting left ventricular ejection fraction is 8554%ith end-diastolic volume of 45 ml and end-systolic volume of 7 ml.  IMPRESSION: 1. No scintigraphic evidence of prior infarction or pharmacologically induced ischemia. 2. Normal wall motion.  Ejection fraction - 85%.   Electronically Signed   By: JoSandi Mariscal.D.   On: 08/03/2013 12:38    RaCordelia PocheMD 08/03/2013, 8:11 AM PGY-1, [facility] pager: [phone] pages welcome

## 2013-08-04 DIAGNOSIS — R079 Chest pain, unspecified: Secondary | ICD-10-CM | POA: Diagnosis not present

## 2013-08-04 DIAGNOSIS — I4891 Unspecified atrial fibrillation: Secondary | ICD-10-CM | POA: Diagnosis not present

## 2013-08-04 DIAGNOSIS — R0602 Shortness of breath: Secondary | ICD-10-CM | POA: Diagnosis not present

## 2013-08-04 LAB — BASIC METABOLIC PANEL
BUN: 16 mg/dL (ref 6–23)
BUN: 15 mg/dL (ref 6–23)
CO2: 20 mEq/L (ref 19–32)
CO2: 21 mEq/L (ref 19–32)
Calcium: 8.4 mg/dL (ref 8.4–10.5)
Calcium: 8.8 mg/dL (ref 8.4–10.5)
Chloride: 92 mEq/L — ABNORMAL LOW (ref 96–112)
Chloride: 90 mEq/L — ABNORMAL LOW (ref 96–112)
Creatinine, Ser: 0.76 mg/dL (ref 0.50–1.10)
Creatinine, Ser: 0.66 mg/dL (ref 0.50–1.10)
GFR calc Af Amer: 90 mL/min — ABNORMAL LOW (ref >90)
GFR calc Af Amer: >90 mL/min (ref >90)
GFR calc non Af Amer: 78 mL/min — ABNORMAL LOW (ref >90)
GFR calc non Af Amer: 81 mL/min — ABNORMAL LOW (ref >90)
Glucose, Bld: 127 mg/dL — ABNORMAL HIGH (ref 70–99)
Glucose, Bld: 113 mg/dL — ABNORMAL HIGH (ref 70–99)
Potassium: 4.2 mEq/L (ref 3.7–5.3)
Potassium: 4.6 mEq/L (ref 3.7–5.3)
Sodium: 129 mEq/L — ABNORMAL LOW (ref 137–147)
Sodium: 126 mEq/L — ABNORMAL LOW (ref 137–147)

## 2013-08-04 LAB — PRO B NATRIURETIC PEPTIDE: Pro B Natriuretic peptide (BNP): 5147 pg/mL — ABNORMAL HIGH (ref 0–450)

## 2013-08-04 MED ORDER — IPRATROPIUM-ALBUTEROL 0.5-2.5 (3) MG/3ML IN SOLN: 3.0000 mL | RESPIRATORY_TRACT | Status: DC

## 2013-08-04 MED ORDER — DILTIAZEM HCL ER COATED BEADS 180 MG PO CP24
180.0000 mg | ORAL_CAPSULE | Freq: Every day | ORAL | Status: DC
  Administered 2013-08-04 – 2013-08-06 (×3): 180 mg via ORAL
  Filled 2013-08-04 (×3): qty 1

## 2013-08-04 MED ORDER — IPRATROPIUM-ALBUTEROL 0.5-2.5 (3) MG/3ML IN SOLN
3.0000 mL | RESPIRATORY_TRACT | Status: DC
  Administered 2013-08-04 (×4): 3 mL via RESPIRATORY_TRACT
  Filled 2013-08-04 (×4): qty 3

## 2013-08-04 NOTE — Progress Notes (Signed)
Transferred to [facility] room 35 by wheelchair, stable, report given to RN, belongings with pt.

## 2013-08-04 NOTE — Progress Notes (Signed)
FMTS ATTENDING NOTE Sara Tubbs,MD I  have seen and examined this patient, reviewed their chart. I have discussed this patient with the resident. I agree with the resident's findings, assessment and care plan. Patient feeling better today except for her SOB which she felt was better last night. She denies any chest pain, she felt a little wheezy. On exam she does have faint expiratory wheezing. Due to hx of chronic smoking in the past there is possibility of COPD. Plan to treat with albuterol. Myoview scan reviewed with EF of 02%, unlikely systolic CHF. Previous hx of grade 1 diastolic dysfunction. Plan to continue current cardiac regimen and treat her SOB as COPD, monitor O2 requirement, if needed with add inhaled steroid to her regimen. As discussed with her she will benefit from PFT as outpatient procedure.

## 2013-08-04 NOTE — Progress Notes (Signed)
     SUBJECTIVE: Only c/o dyspnea.   BP 117/59  Pulse 83  Temp(Src) 98.1 F (36.7 C) (Oral)  Resp 18  Ht 5' 2.5" (1.588 m)  Wt 164 lb 7.4 oz (74.6 kg)  BMI 29.58 kg/m2  SpO2 94%  Intake/Output Summary (Last 24 hours) at 08/04/13 1010 Last data filed at 08/04/13 0900  Gross per 24 hour  Intake 699.92 ml  Output   1100 ml  Net -400.08 ml    PHYSICAL EXAM General: Well developed, well nourished, in no acute distress. Alert and oriented x 3.  Psych:  Good affect, responds appropriately Neck: No JVD. No masses noted.  Lungs: Clear bilaterally with wheezes. No rhonci noted.  Heart: Irreg irreg with no murmurs noted. Abdomen: Bowel sounds are present. Soft, non-tender.  Extremities: No lower extremity edema.   LABS: Basic Metabolic Panel:  Recent Labs  08/03/13 1245 08/04/13 0250  NA 131* 129*  K 5.1 4.2  CL 95* 92*  CO2 20 20  GLUCOSE 106* 127*  BUN 16 16  CREATININE 0.78 0.76  CALCIUM 8.6 8.4   CBC:  Recent Labs  08/02/13 0710  WBC 9.9  NEUTROABS 9.3*  HGB 13.4  HCT 38.6  MCV 90.6  PLT 320   Cardiac Enzymes:  Recent Labs  08/02/13 1431 08/02/13 2016 08/03/13 0157  TROPONINI <0.30 <0.30 <0.30   Fasting Lipid Panel:  Recent Labs  08/02/13 1432  CHOL 103  HDL 58  LDLCALC 30  TRIG 76  CHOLHDL 1.8    Current Meds: . antiseptic oral rinse  15 mL Mouth Rinse BID  . apixaban  5 mg Oral BID  . aspirin  81 mg Oral Daily  . benazepril  20 mg Oral Daily  . diltiazem  60 mg Oral 4 times per day  . metoprolol  100 mg Oral BID  . pantoprazole  40 mg Oral Daily  . senna-docusate  1 tablet Oral BID  . simvastatin  20 mg Oral q1800   Stress myoview 08/03/13: 1. No scintigraphic evidence of prior infarction or  pharmacologically induced ischemia.  2. Normal wall motion. Ejection fraction - 85%.  ASSESSMENT AND PLAN:  1. Atrial fib with RVR: Rate controlled on po beta blocker and po Diltiazem. HR 47-100 overnight. Will change to Cardizem CD 180  mg daily. She is on Eliquis for anticoagulation. While there are several beats with rate of 48 bpm, she has no long pauses and no sustained bradycardia. No dizziness. NO need for pacemaker at this time.   2. Chest pain: non-cardiac. Stress test without ischemia 08/03/13. No further ischemic evaluation.     She could be transferred to the telemetry unit from a cardiac standpoint.   Sara Russell  6/24/201510:10 AM

## 2013-08-04 NOTE — Progress Notes (Signed)
Family Medicine Teaching Service Daily Progress Note Intern Pager: [phone]  Patient name: Sara Russell Medical record number: [id number] Date of birth: 1931-01-31 Age: 80 y.o. Gender: female  Primary Care Provider: Zigmund Gottron, MD Consultants: Cardiology Code Status: Full code  Pt Overview and Major Events to Date:  6/22: patient admitted  Assessment and Plan: Sara Russell is a 80 y.o. year old female presenting with chest pain . PMH is significant for HTN, HLD, AFib, Anemia, DVT/PE on Eliquis.   # Chest pain: resolved. Myoview shows 85% EF and no signs of ischemia - Cardiology recommendations  - morphine as needed for pain - continue benazapril and metoprolol - risk stratification labs (TSH, lipid profile, A1C) wnl - troponin negative x3 - continue home statin  # Shortness of breath: patient now with oxygen requirement from episode yesterday. Myoview suggest some diastolic dysfunction. Patient does not appear fluid overloaded except for crackles. Last echo showed severely dilated left atrium and mildly dilated right atrium. PA pressure mildly elevated at 57mHg. - wean to room air - O2 to keep sats above 92% - PT eval - consider BNP and/or echocardiogram today  # Atrial fibrillation with history of DVT and PE: currently not in RVR. Currently on diltiazem drip. Currently on anticoagulation - diltiazem PO per cardiology recommendations - continue metoprolol - continue Eliquis  # Hx of GERD  - continue PPI   # Hyponatremia: last sodium of 129 down drom 137. Patient appears mostly euvolemic except for crackles on exam. - [facility] - follow-up bmet this afternoon  FEN/GI: Heart healthy, KVO Prophylaxis: Eliquis  Disposition: Discharge home pending improvement  Subjective:  Patient states she has had some shortness of breath with ambulation which was improved with oxygen. Otherwise, no chest pain, no palpitations. Father at  bedside  Objective: Temp:  [97.2 F (36.2 C)-98.2 F (36.8 C)] 98.1 F (36.7 C) (06/24 0800) Pulse Rate:  [34-133] 83 (06/24 0347) Resp:  [16-18] 18 (06/24 0800) BP: (117-164)/(59-89) 117/59 mmHg (06/24 0800) SpO2:  [89 %-96 %] 94 % (06/24 0800) Weight:  [164 lb 7.4 oz (74.6 kg)] 164 lb 7.4 oz (74.6 kg) (06/24 0347)  Physical Exam: General: Laying in bed, in no acute distress, son at bedside Cardiovascular: Regular rate, irregular rhythm, no murmur Respiratory: Bibasilar crackles, scattered wheezing, good air movement Abdomen: Soft, non-tender, non-distended Extremities: No cyanosis, no tenderness or swelling  Laboratory:  Recent Labs Lab 08/02/13 0710  WBC 9.9  HGB 13.4  HCT 38.6  PLT 320    Recent Labs Lab 08/02/13 0836 08/03/13 1245 08/04/13 0250  NA 137 131* 129*  K 3.5* 5.1 4.2  CL 101 95* 92*  CO2 23 20 20   BUN 11 16 16   CREATININE 0.75 0.78 0.76  CALCIUM 8.5 8.6 8.4  GLUCOSE 117* 106* 127*   Risk Stratification Labs  TSH    Component Value Date/Time   TSH 1.240 08/02/2013 1432   Hemoglobin A1C    Component Value Date/Time   HGBA1C 5.3 08/02/2013 1431   Lipid Panel     Component Value Date/Time   CHOL 103 08/02/2013 1432   TRIG 76 08/02/2013 1432   HDL 58 08/02/2013 1432   CHOLHDL 1.8 08/02/2013 1432   VLDL 15 08/02/2013 1432   LDLCALC 30 08/02/2013 1432   Imaging/Diagnostic Tests: Nm Myocar Multi W/spect W/wall Motion / Ef  08/03/2013   CLINICAL DATA:  Chest pain history atrial fibrillation with rapid ventricular response, hypertension, hyperlipidemia, GERD, history DVT  EXAM: MYOCARDIAL IMAGING WITH SPECT (REST AND PHARMACOLOGIC-STRESS)  GATED LEFT VENTRICULAR WALL MOTION STUDY  LEFT VENTRICULAR EJECTION FRACTION  TECHNIQUE: Standard myocardial SPECT imaging was performed after resting intravenous injection of 10 mCi Tc-8msestamibi. Subsequently, intravenous infusion of Lexiscan was performed under the supervision of the Cardiology staff. At peak  effect of the drug, 30 mCi Tc-960mestamibi was injected intravenously and standard myocardial SPECT imaging was performed. Quantitative gated imaging was also performed to evaluate left ventricular wall motion, and estimate left ventricular ejection fraction.  COMPARISON:  Chest radiograph - 08/02/2013; 01/14/2008  FINDINGS: Review of the rotational raw images demonstrates no significant breast or chest wall attenuation. No significant patient motion artifact.  SPECT imaging demonstrates homogeneous distribution of injected radiotracer without scintigraphic evidence of prior infarction pharmacologically induced ischemia.  Quantitative gated analysis demonstrates normal wall motion.  The resting left ventricular ejection fraction is 8546%ith end-diastolic volume of 45 ml and end-systolic volume of 7 ml.  IMPRESSION: 1. No scintigraphic evidence of prior infarction or pharmacologically induced ischemia. 2. Normal wall motion.  Ejection fraction - 85%.   Electronically Signed   By: JoSandi Mariscal.D.   On: 08/03/2013 12:38    RaCordelia PocheMD 08/04/2013, 9:37 AM PGY-1, [facility] pager: [phone] pages welcome

## 2013-08-04 NOTE — Progress Notes (Signed)
Ambulated with PT with o2 4l [place]  and  reported that she desats to 84% with slight SOB. Noted with  Audible wheezing, MD aware with order for duoneb.. Continue to monitor.

## 2013-08-04 NOTE — Progress Notes (Signed)
Seen and examined.   C/O cough and wheezing.  On O2.  No chest pain.  Note wide range in BP and heart rate. Issues: 1. Chest pain.  Given normal radionucleotide scan, I feel confident saying not angina/CAD.  I am less clear on specific etiology - see my note of yesterday. 2. SOB - now with cough and wheeze.  Etiology remains unclear.  I would repeat CXR today to see if anything has blossomed.  Also suggest BNP to help with the possibility of diastolic dysfunction CHF.  Consider PFTs as an outpatient. 3.  Labile BP and HR.  First, I am not inclined to discharge until cleared by cards.  The bradycardia and low BPs put her at increased risk for falls.  I am also beginning to wonder if she has sick sinus node syndrome.  Defer to cards for their recommendations.   4. Functional status.  Good that PT will be involved.  Lives alone in house with stairs.  She is very independent and will downplay her need for help. Domenic Schwab

## 2013-08-04 NOTE — Evaluation (Signed)
Physical Therapy Evaluation Patient Details Name: Sara Russell MRN: [id number] DOB: Dec 26, 1930 Today's Date: 08/04/2013   History of Present Illness  Pt is an 80 y/o female presenting with chest pain . PMH is significant for HTN, HLD, AFib, Anemia, DVT/PE on Eliquis. Pt admitted with a new diagnosis of A-Fib with RVR.   Clinical Impression  Pt admitted with the above. Pt currently with functional limitations due to the deficits listed below (see PT Problem List). At the time of PT eval pt was able to ambulate ~150 feet with RW and supplemental O2. During gait training sats decreased from 97% to 86%. When pt took a seated rest break, sats continued to decrease to 84% before improving back into the high 90's. Pt will benefit from skilled PT to increase their independence and safety with mobility to allow discharge to the venue listed below.     Follow Up Recommendations Home health PT;Supervision - Intermittent    Equipment Recommendations  None recommended by PT    Recommendations for Other Services       Precautions / Restrictions Precautions Precautions: Fall Restrictions Weight Bearing Restrictions: No      Mobility  Bed Mobility               General bed mobility comments: Pt sitting up in chair upon PT arrival.   Transfers Overall transfer level: Needs assistance Equipment used: Rolling walker (2 wheeled) Transfers: Sit to/from Stand Sit to Stand: Supervision         General transfer comment: Pt able to power-up to full sitting position without assist. VC's for hand placement on seated surface for safety.   Ambulation/Gait Ambulation/Gait assistance: Min guard Ambulation Distance (Feet): 150 Feet Assistive device: Rolling walker (2 wheeled) Gait Pattern/deviations: Step-through pattern;Decreased stride length;Trunk flexed Gait velocity: Decreased Gait velocity interpretation: Below normal speed for age/gender General Gait Details: VC's for improved  walker placement and for pursed-lip breathing.   Stairs            Wheelchair Mobility    Modified Rankin (Stroke Patients Only)       Balance Overall balance assessment: Needs assistance Sitting-balance support: Feet supported;No upper extremity supported Sitting balance-Leahy Scale: Good     Standing balance support: No upper extremity supported Standing balance-Leahy Scale: Fair                               Pertinent Vitals/Pain See above for O2 sats.     Home Living Family/patient expects to be discharged to:: Private residence Living Arrangements: Alone Available Help at Discharge: Family;Available 24 hours/day Type of Home: House Home Access: Stairs to enter Entrance Stairs-Rails: None (on back step) Entrance Stairs-Number of Steps: 5 in the front, 1 in the back Home Layout: One level Home Equipment: Walker - 2 wheels;Cane - quad;Cane - single point Additional Comments: Plan is to stay with son. Info above is her his home.     Prior Function Level of Independence: Independent with assistive device(s)         Comments: Began using a cane due to pain in ankle recently     Hand Dominance   Dominant Hand: Right    Extremity/Trunk Assessment   Upper Extremity Assessment: Defer to OT evaluation           Lower Extremity Assessment: Generalized weakness      Cervical / Trunk Assessment: Kyphotic  Communication   Communication: No difficulties  Cognition Arousal/Alertness: Awake/alert Behavior During Therapy: WFL for tasks assessed/performed Overall Cognitive Status: Within Functional Limits for tasks assessed                      General Comments      Exercises        Assessment/Plan    PT Assessment Patient needs continued PT services  PT Diagnosis Difficulty walking;Generalized weakness   PT Problem List Decreased strength;Decreased range of motion;Decreased activity tolerance;Decreased balance;Decreased  mobility;Decreased knowledge of use of DME;Decreased safety awareness;Decreased knowledge of precautions;Cardiopulmonary status limiting activity  PT Treatment Interventions DME instruction;Gait training;Stair training;Functional mobility training;Therapeutic activities;Therapeutic exercise;Neuromuscular re-education;Patient/family education   PT Goals (Current goals can be found in the Care Plan section) Acute Rehab PT Goals Patient Stated Goal: To return to her own home as soon as possible.  PT Goal Formulation: With patient/family Time For Goal Achievement: 08/11/13 Potential to Achieve Goals: Good    Frequency Min 3X/week   Barriers to discharge Decreased caregiver support Pt lives alone. Planning to stay with son for a short period of time prior to returning home alone.     Co-evaluation               End of Session Equipment Utilized During Treatment: Gait belt;Oxygen Activity Tolerance: Patient tolerated treatment well Patient left: in chair;with call bell/phone within reach;with family/visitor present Nurse Communication: Mobility status         Time: 7517-0017 PT Time Calculation (min): 28 min   Charges:   PT Evaluation $Initial PT Evaluation Tier I: 1 Procedure PT Treatments $Therapeutic Activity: 8-22 mins   PT G Codes:          Jolyn Lent 08/04/2013, 1:22 PM  Jolyn Lent, PT, DPT Acute Rehabilitation Services Pager: [phone]

## 2013-08-05 ENCOUNTER — Inpatient Hospital Stay (HOSPITAL_COMMUNITY): Payer: Medicare Other

## 2013-08-05 DIAGNOSIS — I4891 Unspecified atrial fibrillation: Secondary | ICD-10-CM | POA: Diagnosis not present

## 2013-08-05 DIAGNOSIS — J984 Other disorders of lung: Secondary | ICD-10-CM | POA: Diagnosis not present

## 2013-08-05 DIAGNOSIS — R079 Chest pain, unspecified: Secondary | ICD-10-CM | POA: Diagnosis not present

## 2013-08-05 LAB — BASIC METABOLIC PANEL
BUN: 12 mg/dL (ref 6–23)
CO2: 24 mEq/L (ref 19–32)
Calcium: 9 mg/dL (ref 8.4–10.5)
Chloride: 89 mEq/L — ABNORMAL LOW (ref 96–112)
Creatinine, Ser: 0.57 mg/dL (ref 0.50–1.10)
GFR calc Af Amer: >90 mL/min (ref >90)
GFR calc non Af Amer: 85 mL/min — ABNORMAL LOW (ref >90)
Glucose, Bld: 102 mg/dL — ABNORMAL HIGH (ref 70–99)
Potassium: 3.6 mEq/L — ABNORMAL LOW (ref 3.7–5.3)
Sodium: 127 mEq/L — ABNORMAL LOW (ref 137–147)

## 2013-08-05 MED ORDER — ALBUTEROL SULFATE (2.5 MG/3ML) 0.083% IN NEBU
2.5000 mg | INHALATION_SOLUTION | RESPIRATORY_TRACT | Status: DC | PRN
  Administered 2013-08-05: 2.5 mg via RESPIRATORY_TRACT
  Filled 2013-08-05: qty 3

## 2013-08-05 MED ORDER — ATORVASTATIN CALCIUM 10 MG PO TABS
10.0000 mg | ORAL_TABLET | Freq: Every day | ORAL | Status: DC
  Administered 2013-08-05: 10 mg via ORAL
  Filled 2013-08-05 (×2): qty 1

## 2013-08-05 MED ORDER — IPRATROPIUM-ALBUTEROL 0.5-2.5 (3) MG/3ML IN SOLN
3.0000 mL | Freq: Three times a day (TID) | RESPIRATORY_TRACT | Status: DC
  Administered 2013-08-05 – 2013-08-06 (×4): 3 mL via RESPIRATORY_TRACT
  Filled 2013-08-05 (×5): qty 3

## 2013-08-05 MED ORDER — FUROSEMIDE 10 MG/ML IJ SOLN
40.0000 mg | Freq: Once | INTRAMUSCULAR | Status: AC
  Administered 2013-08-05: 40 mg via INTRAVENOUS
  Filled 2013-08-05: qty 4

## 2013-08-05 NOTE — Progress Notes (Signed)
     SUBJECTIVE: Only c/o dyspnea.   BP 146/78  Pulse 95  Temp(Src) 98.6 F (37 C) (Oral)  Resp 18  Ht 5' 2.5" (1.588 m)  Wt 166 lb 14.2 oz (75.7 kg)  BMI 30.02 kg/m2  SpO2 96%  Intake/Output Summary (Last 24 hours) at 08/05/13 1016 Last data filed at 08/05/13 0730  Gross per 24 hour  Intake    240 ml  Output      0 ml  Net    240 ml    PHYSICAL EXAM General: Well developed, well nourished, in no acute distress. Alert and oriented x 3.  Psych:  Good affect, responds appropriately Neck: No JVD. No masses noted.  Lungs: she has tight wheezes  Heart: Irreg irreg with no murmurs noted. Abdomen: Bowel sounds are present. Soft, non-tender.  Extremities: No lower extremity edema.   LABS: Basic Metabolic Panel:  Recent Labs  08/04/13 1350 08/05/13 0647  NA 126* 127*  K 4.6 3.6*  CL 90* 89*  CO2 21 24  GLUCOSE 113* 102*  BUN 15 12  CREATININE 0.66 0.57  CALCIUM 8.8 9.0   CBC: No results found for this basename: WBC, NEUTROABS, HGB, HCT, MCV, PLT,  in the last 72 hours Cardiac Enzymes:  Recent Labs  08/02/13 1431 08/02/13 2016 08/03/13 0157  TROPONINI <0.30 <0.30 <0.30   Fasting Lipid Panel:  Recent Labs  08/02/13 1432  CHOL 103  HDL 58  LDLCALC 30  TRIG 76  CHOLHDL 1.8    Current Meds: . antiseptic oral rinse  15 mL Mouth Rinse BID  . apixaban  5 mg Oral BID  . aspirin  81 mg Oral Daily  . benazepril  20 mg Oral Daily  . diltiazem  180 mg Oral Daily  . ipratropium-albuterol  3 mL Nebulization TID  . metoprolol  100 mg Oral BID  . pantoprazole  40 mg Oral Daily  . senna-docusate  1 tablet Oral BID  . simvastatin  20 mg Oral q1800   Stress myoview 08/03/13: 1. No scintigraphic evidence of prior infarction or  pharmacologically induced ischemia.  2. Normal wall motion. Ejection fraction - 85%.  ASSESSMENT AND PLAN:  1. Atrial fib with RVR: Rate controlled on po beta blocker and po Diltiazem. HR 47-100 overnight. Will change to Cardizem CD  180 mg daily. She is on Eliquis for anticoagulation. While there are several beats with rate of 48 bpm, she has no long pauses and no sustained bradycardia. No dizziness. NO need for pacemaker at this time.   2. Chest pain: non-cardiac. Stress test without ischemia 08/03/13. No further ischemic evaluation.   EF 85%   Echo showed normal EF ( 55-60% by echo) severe LAE, moderate TR   3. Wheezing: she has significant obstruction today.  Tight wheezes.  This is likely the cause of her chest heaviness / tightness.   Just had a neb treatment.  CXR from 6/22 looked OK.  Will repeat.      Sara Russell.  6/25/201510:16 AM

## 2013-08-05 NOTE — Progress Notes (Signed)
Family Medicine Teaching Service Daily Progress Note Intern Pager: [phone]  Patient name: Sara Russell Medical record number: [id number] Date of birth: August 06, 1930 Age: 80 y.o. Gender: female  Primary Care Provider: Zigmund Gottron, MD Consultants: Cardiology Code Status: Full code  Pt Overview and Major Events to Date:  6/22: patient admitted  Assessment and Plan: Sara Russell is a 80 y.o. year old female presenting with chest pain . PMH is significant for HTN, HLD, AFib, Anemia, DVT/PE on Eliquis.   # Shortness of breath: Patient with continued oxygen requirement with ambulation. Was not weaned off O2 at rest. Crackles at lung bases, but otherwise, appears euvolemic. Last echo showed severely dilated left atrium and mildly dilated right atrium. PA pressure mildly elevated at 55mHg. BNP elevated to 5147. Picture  - wean to room air - O2 to keep sats above 92% - PT recommendations: home health PT (ordered)   # Atrial fibrillation with history of DVT and PE: currently rate controlled. Currently on diltiazem. Currently on anticoagulation - diltiazem 24hr 1875mPO per cardiology recommendations - continue metoprolol - continue Eliquis  # Chest pain: resolved. Myoview shows 85% EF and no signs of ischemia. Troponin neg x3. - continue benazapril and metoprolol - continue home statin  # Hx of GERD  - continue PPI   # Hyponatremia: last sodium of 127 with low of 126. Down drom 137. Patient appears mostly euvolemic except for crackles on exam. [facility] - follow-up [facility] this afternoon - fluid restriction - consider urine sodium studies  FEN/GI: Heart healthy, KVO Prophylaxis: Eliquis  Disposition: Discharge home pending improvement  Subjective:  Patient states she is still having some shortness of breath and wheezing. Shortness of breath is worse with ambulation. Tolerated ambulation with oxygen. No chest pain or palpitations. Son at  bedside  Objective: Temp:  [98.1 F (36.7 C)-98.6 F (37 C)] 98.6 F (37 C) (06/25 0501) Pulse Rate:  [66-95] 95 (06/25 0501) Resp:  [16-18] 18 (06/25 0501) BP: (136-163)/(65-116) 146/78 mmHg (06/25 0501) SpO2:  [92 %-97 %] 97 % (06/25 0501) Weight:  [166 lb 14.2 oz (75.7 kg)] 166 lb 14.2 oz (75.7 kg) (06/25 0501)  Physical Exam: General: Laying in bed, in no acute distress, son at bedside Cardiovascular: Regular rate, irregular rhythm, no murmur, intact pulses  Respiratory: Bibasilar crackles, scattered wheezing, good air movement Abdomen: Soft, non-tender, non-distended Extremities: No cyanosis, no tenderness or swelling Neuro: alert and oriented x3  Laboratory:  Recent Labs Lab 08/02/13 0710  WBC 9.9  HGB 13.4  HCT 38.6  PLT 320    Recent Labs Lab 08/04/13 0250 08/04/13 1350 08/05/13 0647  NA 129* 126* 127*  K 4.2 4.6 3.6*  CL 92* 90* 89*  CO2 20 21 24   BUN 16 15 12   CREATININE 0.76 0.66 0.57  CALCIUM 8.4 8.8 9.0  GLUCOSE 127* 113* 102*   Pro B Natriuretic peptide (BNP)  Date Value Ref Range Status  08/04/2013 5147.0* 0 - 450 pg/mL Final   Risk Stratification Labs  TSH    Component Value Date/Time   TSH 1.240 08/02/2013 1432   Hemoglobin A1C    Component Value Date/Time   HGBA1C 5.3 08/02/2013 1431   Lipid Panel     Component Value Date/Time   CHOL 103 08/02/2013 1432   TRIG 76 08/02/2013 1432   HDL 58 08/02/2013 1432   CHOLHDL 1.8 08/02/2013 1432   VLDL 15 08/02/2013 1432   LDLCALC 30 08/02/2013 1432  Imaging/Diagnostic Tests: Nm Myocar Multi W/spect W/wall Motion / Ef  08/03/2013   CLINICAL DATA:  Chest pain history atrial fibrillation with rapid ventricular response, hypertension, hyperlipidemia, GERD, history DVT  EXAM: MYOCARDIAL IMAGING WITH SPECT (REST AND PHARMACOLOGIC-STRESS)  GATED LEFT VENTRICULAR WALL MOTION STUDY  LEFT VENTRICULAR EJECTION FRACTION  TECHNIQUE: Standard myocardial SPECT imaging was performed after resting intravenous  injection of 10 mCi Tc-78msestamibi. Subsequently, intravenous infusion of Lexiscan was performed under the supervision of the Cardiology staff. At peak effect of the drug, 30 mCi Tc-959mestamibi was injected intravenously and standard myocardial SPECT imaging was performed. Quantitative gated imaging was also performed to evaluate left ventricular wall motion, and estimate left ventricular ejection fraction.  COMPARISON:  Chest radiograph - 08/02/2013; 01/14/2008  FINDINGS: Review of the rotational raw images demonstrates no significant breast or chest wall attenuation. No significant patient motion artifact.  SPECT imaging demonstrates homogeneous distribution of injected radiotracer without scintigraphic evidence of prior infarction pharmacologically induced ischemia.  Quantitative gated analysis demonstrates normal wall motion.  The resting left ventricular ejection fraction is 8552%ith end-diastolic volume of 45 ml and end-systolic volume of 7 ml.  IMPRESSION: 1. No scintigraphic evidence of prior infarction or pharmacologically induced ischemia. 2. Normal wall motion.  Ejection fraction - 85%.   Electronically Signed   By: JoSandi Mariscal.D.   On: 08/03/2013 12:38    RaCordelia PocheMD 08/05/2013, 9:17 AM PGY-1, [place] pager: [phone] pages welcome

## 2013-08-05 NOTE — Progress Notes (Signed)
FMTS ATTENDING  NOTE Jamaar Howes,MD I  have seen and examined this patient, reviewed their chart. I have discussed this patient with the resident. I agree with the resident's findings, assessment and care plan. 

## 2013-08-05 NOTE — Care Management Note (Signed)
    Page 1 of 1   08/06/2013     4:04:47 PM CARE MANAGEMENT NOTE 08/06/2013  Patient:  Sara Russell, Sara Russell   Account Number:  [id number]  Date Initiated:  08/05/2013  Documentation initiated by:  Kellon Chalk  Subjective/Objective Assessment:   Pt adm on 08/02/13 with chest pain, Afib, and dyspnea.  PTA, pt resides at home alone; has two supportive sons.     Action/Plan:   Pt needs HH follow up at dc; may need home oxygen, as cont to desat with ambulation. Will follow progress.   Anticipated DC Date:  08/06/2013   Anticipated DC Plan:  [place]  CM consult      [facility] Choice  HOME HEALTH   Choice offered to / List presented to:  C-1 Patient        [place] arranged  [place]      [place].   Status of service:  Completed, signed off Medicare Important Message given?  YES (If response is "NO", the following Medicare IM given date fields will be blank) Date Medicare IM given:  08/05/2013 Date Additional Medicare IM given:    Discharge Disposition:  [place]  Per UR Regulation:  Reviewed for med. necessity/level of care/duration of stay  If discussed at [place] of Stay Meetings, dates discussed:    Comments:  08/05/13 Ellan Lambert, RN, BSN [phone] Referral to [facility], per pt choice; start of care 24-48h post dc date.

## 2013-08-05 NOTE — Progress Notes (Signed)
O2 down to 2L [place] at this time; will cont. To monitor vital signs.

## 2013-08-06 DIAGNOSIS — R079 Chest pain, unspecified: Secondary | ICD-10-CM | POA: Diagnosis not present

## 2013-08-06 DIAGNOSIS — I4891 Unspecified atrial fibrillation: Secondary | ICD-10-CM | POA: Diagnosis not present

## 2013-08-06 DIAGNOSIS — R0602 Shortness of breath: Secondary | ICD-10-CM | POA: Diagnosis not present

## 2013-08-06 LAB — BASIC METABOLIC PANEL
BUN: 11 mg/dL (ref 6–23)
CO2: 25 mEq/L (ref 19–32)
Calcium: 8.6 mg/dL (ref 8.4–10.5)
Chloride: 85 mEq/L — ABNORMAL LOW (ref 96–112)
Creatinine, Ser: 0.64 mg/dL (ref 0.50–1.10)
GFR calc Af Amer: >90 mL/min (ref >90)
GFR calc non Af Amer: 82 mL/min — ABNORMAL LOW (ref >90)
Glucose, Bld: 93 mg/dL (ref 70–99)
Potassium: 3.4 mEq/L — ABNORMAL LOW (ref 3.7–5.3)
Sodium: 126 mEq/L — ABNORMAL LOW (ref 137–147)

## 2013-08-06 MED ORDER — ALBUTEROL SULFATE HFA 108 (90 BASE) MCG/ACT IN AERS: 2.0000 | INHALATION_SPRAY | Freq: Four times a day (QID) | RESPIRATORY_TRACT | Status: DC | PRN

## 2013-08-06 MED ORDER — ATORVASTATIN CALCIUM 10 MG PO TABS: 10.0000 mg | ORAL_TABLET | Freq: Every day | ORAL | Status: DC

## 2013-08-06 MED ORDER — AZITHROMYCIN 250 MG PO TABS: 500.0000 mg | ORAL_TABLET | Freq: Every day | ORAL | Status: AC

## 2013-08-06 MED ORDER — DILTIAZEM HCL ER COATED BEADS 180 MG PO CP24: 180.0000 mg | ORAL_CAPSULE | Freq: Every day | ORAL | Status: DC

## 2013-08-06 MED ORDER — ASPIRIN 81 MG PO CHEW: 81.0000 mg | CHEWABLE_TABLET | Freq: Every day | ORAL | Status: DC

## 2013-08-06 MED ORDER — AZITHROMYCIN 500 MG PO TABS
500.0000 mg | ORAL_TABLET | Freq: Every day | ORAL | Status: AC
  Administered 2013-08-06: 500 mg via ORAL
  Filled 2013-08-06: qty 1

## 2013-08-06 NOTE — Progress Notes (Signed)
Discharge medication, education, and follow up appts discussed with pt and pts son, all questions answered, IV and tele removed, pt dressed awaiting ABX dose Rickard Rhymes, RN

## 2013-08-06 NOTE — Progress Notes (Signed)
Physical Therapy Treatment Patient Details Name: Sara Russell MRN: [id number] DOB: 1930/09/19 Today's Date: 08/06/2013    History of Present Illness Pt is an 80 y/o female presenting with chest pain . PMH is significant for HTN, HLD, AFib, Anemia, DVT/PE on Eliquis. Pt admitted with a new diagnosis of A-Fib with RVR.     PT Comments    Patient progressing with activity tolerance this session able to ambulate on room air and negotiate steps without O2 and SpO2 93%.  Still needs follow up HHPT for safety reinforcement and balance training.  Remains high fall risk and needs walker 100% of the time.  Follow Up Recommendations  Home health PT;Supervision - Intermittent     Equipment Recommendations  None recommended by PT    Recommendations for Other Services       Precautions / Restrictions Precautions Precautions: Fall    Mobility  Bed Mobility Overal bed mobility: Modified Independent                Transfers Overall transfer level: Modified independent Equipment used: None Transfers: Sit to/from Stand Sit to Stand: Supervision         General transfer comment: stood without assist, noted standing with legs braced against bed, patient asking for hand for assist to walk  Ambulation/Gait Ambulation/Gait assistance: Min assist Ambulation Distance (Feet): 240 Feet Assistive device: 1 person hand held assist Gait Pattern/deviations: Step-through pattern;Decreased stride length;Wide base of support     General Gait Details: left foot/ankle pronated, externally rotated and plantarflexed with gait   Stairs Stairs: Yes Stairs assistance: Min assist Stair Management: One rail Right;Forwards;Step to pattern (and hand held assist) Number of Stairs: 3 General stair comments: leads with left  Wheelchair Mobility    Modified Rankin (Stroke Patients Only)       Balance Overall balance assessment: Needs assistance           Standing balance-Leahy Scale:  Poor Standing balance comment: legs braced against bed in standing                    Cognition Arousal/Alertness: Awake/alert Behavior During Therapy: WFL for tasks assessed/performed Overall Cognitive Status: Within Functional Limits for tasks assessed                      Exercises      General Comments General comments (skin integrity, edema, etc.): Educated need to use walker for home ambulation for improved safety and independence.  Still plans for d/c to son's home briefly prior to d/c to her home.      Pertinent Vitals/Pain No pain initially, after ambulation mild pain left ankle/foot; SpO2 93% and HR 112 ambulating on room air    Home Living                      Prior Function            PT Goals (current goals can now be found in the care plan section) Progress towards PT goals: Progressing toward goals    Frequency  Min 3X/week    PT Plan Current plan remains appropriate    Co-evaluation             End of Session Equipment Utilized During Treatment: Gait belt Activity Tolerance: Patient tolerated treatment well Patient left: in bed;with call bell/phone within reach     Time: 1140-1204 PT Time Calculation (min): 24 min  Charges:  $Gait Training: 8-22  mins $Self Care/Home Management: 8-22                    G Codes:      WYNN,CYNDI 23-Aug-2013, 12:24 PM Magda Kiel, [place] 2013-08-23

## 2013-08-06 NOTE — Progress Notes (Signed)
SATURATION QUALIFICATIONS: (This note is used to comply with regulatory documentation for home oxygen)  Patient Saturations on Room Air at Rest = 90%  Patient Saturations on Room Air while Ambulating = 93%    Rickard Rhymes, RN

## 2013-08-06 NOTE — Progress Notes (Signed)
Family Medicine Teaching Service Daily Progress Note Intern Pager: [phone]  Patient name: Sara Russell Medical record number: [id number] Date of birth: Mar 16, 1930 Age: 80 y.o. Gender: female  Primary Care Provider: Zigmund Gottron, MD Consultants: Cardiology Code Status: Full code  Pt Overview and Major Events to Date:  6/22: patient admitted  Assessment and Plan: Sara Russell is a 80 y.o. year old female presenting with chest pain . PMH is significant for HTN, HLD, AFib, Anemia, DVT/PE on Eliquis.   # Shortness of breath: Patient with continued oxygen requirement with ambulation. Was not weaned off O2 at rest. Crackles at lung bases improved. Increased wheezing. Appears euvolemic. Last echo showed severely dilated left atrium and mildly dilated right atrium. PA pressure mildly elevated at 68mHg. BNP elevated to 5147. - wean to room air - O2 to keep sats above 92% - goal for O2 >88% while ambulating - PT recommendations: home health PT (ordered)  # COPD exacerbation: patient with long history of smoking. Has cough with increased sputum production and decreased exercise tolerance. Currently has an O2 requirement. - continue duonebs - will consider prednisone 47mx5 days - albuterol PRN on discharge  # Atrial fibrillation with history of DVT and PE: currently rate controlled. Currently on diltiazem. Currently on anticoagulation - diltiazem 24hr 18078mO per cardiology recommendations - continue metoprolol - continue Eliquis  # Hiatal hernia: noticed on chest x-ray. Patient appears to be asymptomatic, although unsure if respiratory picture related to possible reflux/aspiration - will need barium swallow for evaluation  # Chest pain: resolved. Myoview shows 85% EF and no signs of ischemia. Troponin neg x3. - continue benazapril and metoprolol - continue home statin  # Hx of GERD - continue PPI   # Hyponatremia: last sodium of 126 with low of 126. Down drom 137  although has been mainly around 132. Patient appears euvolemic. [facility] follow-up [facility] - fluid restriction  FEN/GI: Heart healthy, KVO Prophylaxis: Eliquis  Disposition: Discharge home today if can be weaned off O2, although may need home O2.  Subjective:  Patient's symptoms have improved greatly. She is breathing much better. She reports no shortness of breath or chest pain.  Objective: Temp:  [97.6 F (36.4 C)-99.5 F (37.5 C)] 97.6 F (36.4 C) (06/26 0530) Pulse Rate:  [90-105] 95 (06/26 0530) Resp:  [18] 18 (06/26 0530) BP: (148-160)/(62-90) 148/77 mmHg (06/26 0530) SpO2:  [96 %-100 %] 97 % (06/26 0530) Weight:  [162 lb 14.7 oz (73.9 kg)] 162 lb 14.7 oz (73.9 kg) (06/26 0530)  Physical Exam: General: Laying in bed, in no acute distress, son at bedside Cardiovascular: Regular rate, irregular rhythm, no murmur, intact pulses  Respiratory: Diffuse expiratory wheezing, good air movement bilaterally Abdomen: Soft, non-tender, non-distended Extremities: No cyanosis, no tenderness or swelling Neuro: alert and oriented x3  Laboratory:  Recent Labs Lab 08/02/13 0710  WBC 9.9  HGB 13.4  HCT 38.6  PLT 320    Recent Labs Lab 08/04/13 1350 08/05/13 0647 08/06/13 0345  NA 126* 127* 126*  K 4.6 3.6* 3.4*  CL 90* 89* 85*  CO2 21 24 25   BUN 15 12 11   CREATININE 0.66 0.57 0.64  CALCIUM 8.8 9.0 8.6  GLUCOSE 113* 102* 93   Pro B Natriuretic peptide (BNP)  Date Value Ref Range Status  08/04/2013 5147.0* 0 - 450 pg/mL Final   Risk Stratification Labs  TSH    Component Value Date/Time   TSH 1.240 08/02/2013 1432  Hemoglobin A1C    Component Value Date/Time   HGBA1C 5.3 08/02/2013 1431   Lipid Panel     Component Value Date/Time   CHOL 103 08/02/2013 1432   TRIG 76 08/02/2013 1432   HDL 58 08/02/2013 1432   CHOLHDL 1.8 08/02/2013 1432   VLDL 15 08/02/2013 1432   LDLCALC 30 08/02/2013 1432   Imaging/Diagnostic Tests: Dg Chest 2 View  08/05/2013    CLINICAL DATA:  Chest pain for 4 days  EXAM: CHEST  2 VIEW  COMPARISON:  08/02/2013  FINDINGS: Left lower lobe hazy airspace disease which may reflect atelectasis versus pneumonia. Linear right lower lung airspace disease likely reflecting discoid atelectasis. No pleural effusion or pneumothorax. Stable cardiomediastinal silhouette.  Large hiatal hernia.  The osseous structures are unremarkable.  IMPRESSION: Hazy left lower lobe airspace disease which may reflect atelectasis versus pneumonia.   Electronically Signed   By: Kathreen Devoid   On: 08/05/2013 21:48    Cordelia Poche, MD 08/06/2013, 6:43 AM PGY-1, [place] Intern pager: [phone], text pages welcome

## 2013-08-06 NOTE — Discharge Instructions (Signed)
[name], you came in with chest pain which was not found to be a heart attack. Your shortness of breath seems to be related to fluid in your lungs. We gave you a diuretic which really improved your breathing. We will send you home on albuterol for wheezing or shortness of breath because of your prior smoking history. We will also send you home on antibiotics since your chest x-ray was suspicious for a pneumonia of the lower lobe of your left lung. This will be for 5 days. Otherwise, please follow-up with Dr. Bonner Puna on 6/30.

## 2013-08-06 NOTE — Progress Notes (Signed)
     SUBJECTIVE: Only c/o dyspnea.   BP 136/69  Pulse 88  Temp(Src) 98.1 F (36.7 C) (Oral)  Resp 18  Ht 5' 2.5" (1.588 m)  Wt 162 lb 14.7 oz (73.9 kg)  BMI 29.31 kg/m2  SpO2 96%  Intake/Output Summary (Last 24 hours) at 08/06/13 1308 Last data filed at 08/06/13 1219  Gross per 24 hour  Intake    480 ml  Output   3000 ml  Net  -2520 ml    PHYSICAL EXAM General: Well developed, well nourished, in no acute distress. Alert and oriented x 3.  Psych:  Good affect, responds appropriately Neck: No JVD. No masses noted.  Lungs: she has tight wheezes  Heart: Irreg irreg with no murmurs noted. Abdomen: Bowel sounds are present. Soft, non-tender.  Extremities: No lower extremity edema.   LABS: Basic Metabolic Panel:  Recent Labs  08/05/13 0647 08/06/13 0345  NA 127* 126*  K 3.6* 3.4*  CL 89* 85*  CO2 24 25  GLUCOSE 102* 93  BUN 12 11  CREATININE 0.57 0.64  CALCIUM 9.0 8.6   CBC: No results found for this basename: WBC, NEUTROABS, HGB, HCT, MCV, PLT,  in the last 72 hours Cardiac Enzymes: No results found for this basename: CKTOTAL, CKMB, CKMBINDEX, TROPONINI,  in the last 72 hours Fasting Lipid Panel: No results found for this basename: CHOL, HDL, LDLCALC, TRIG, CHOLHDL, LDLDIRECT,  in the last 72 hours  Current Meds: . antiseptic oral rinse  15 mL Mouth Rinse BID  . apixaban  5 mg Oral BID  . aspirin  81 mg Oral Daily  . atorvastatin  10 mg Oral q1800  . benazepril  20 mg Oral Daily  . diltiazem  180 mg Oral Daily  . ipratropium-albuterol  3 mL Nebulization TID  . metoprolol  100 mg Oral BID  . pantoprazole  40 mg Oral Daily  . senna-docusate  1 tablet Oral BID   Stress myoview 08/03/13: 1. No scintigraphic evidence of prior infarction or  pharmacologically induced ischemia.  2. Normal wall motion. Ejection fraction - 85%.  ASSESSMENT AND PLAN:  1. Atrial fib with RVR: Rate controlled on po beta blocker and po Diltiazem. HR 47-100 overnight. Will change  to Cardizem CD 180 mg daily. She is on Eliquis for anticoagulation. While there are several beats with rate of 48 bpm, she has no long pauses and no sustained bradycardia. No dizziness. NO need for pacemaker at this time.   2. Chest pain: non-cardiac. Stress test without ischemia 08/03/13. No further ischemic evaluation.   EF 85%   Echo showed normal EF ( 55-60% by echo) severe LAE, moderate TR   Cardiac enzymes are negative. She may have some diastolic dysfunction  ( not unexpected in 80 yo).   3. Wheezing: she has significant obstruction yesterday.  Much better today. CXR showed decreased inspiration.  I think she may have a LLL pneumonia.  Will order Incentive spirometry.     will sign off.  Call for questions.      Darden Amber.  6/26/20151:08 PM

## 2013-08-06 NOTE — Progress Notes (Signed)
O2 down to 1L [place], will continue to monitor Rickard Rhymes, RN

## 2013-08-06 NOTE — Progress Notes (Signed)
Pt ambulated 150 feet on room air, tolerated well Rickard Rhymes, RN

## 2013-08-06 NOTE — Progress Notes (Signed)
FMTS ATTENDING  NOTE Morning Halberg,MD I  have seen and examined this patient, reviewed their chart. I have discussed this patient with the resident. I agree with the resident's findings, assessment and care plan. 

## 2013-08-07 ENCOUNTER — Telehealth: Payer: Self-pay | Admitting: Family Medicine

## 2013-08-07 MED ORDER — NITROGLYCERIN 0.4 MG SL SUBL: 0.4000 mg | SUBLINGUAL_TABLET | SUBLINGUAL | Status: DC | PRN

## 2013-08-07 NOTE — Telephone Encounter (Signed)
Called requesting Nitro as it was not sent in on discharge. Rx sent to [facility] on [address]

## 2013-08-08 DIAGNOSIS — E119 Type 2 diabetes mellitus without complications: Secondary | ICD-10-CM | POA: Diagnosis not present

## 2013-08-08 DIAGNOSIS — J189 Pneumonia, unspecified organism: Secondary | ICD-10-CM | POA: Diagnosis not present

## 2013-08-08 DIAGNOSIS — K449 Diaphragmatic hernia without obstruction or gangrene: Secondary | ICD-10-CM | POA: Diagnosis not present

## 2013-08-08 DIAGNOSIS — Z7901 Long term (current) use of anticoagulants: Secondary | ICD-10-CM | POA: Diagnosis not present

## 2013-08-08 DIAGNOSIS — I4891 Unspecified atrial fibrillation: Secondary | ICD-10-CM | POA: Diagnosis not present

## 2013-08-08 DIAGNOSIS — R5381 Other malaise: Secondary | ICD-10-CM | POA: Diagnosis not present

## 2013-08-08 NOTE — Discharge Summary (Signed)
FMTS ATTENDING  NOTE Kehinde Eniola,MD I  have seen and examined this patient, reviewed their chart. I have discussed this patient with the resident. I agree with the resident's findings, assessment and care plan. 

## 2013-08-10 ENCOUNTER — Encounter: Payer: Self-pay | Admitting: Family Medicine

## 2013-08-10 ENCOUNTER — Ambulatory Visit (INDEPENDENT_AMBULATORY_CARE_PROVIDER_SITE_OTHER): Payer: Medicare Other | Admitting: Family Medicine

## 2013-08-10 VITALS — BP 136/87 | HR 90 | Temp 98.2°F | Ht 62.5 in | Wt 158.0 lb

## 2013-08-10 DIAGNOSIS — I4891 Unspecified atrial fibrillation: Secondary | ICD-10-CM

## 2013-08-10 DIAGNOSIS — E871 Hypo-osmolality and hyponatremia: Secondary | ICD-10-CM | POA: Insufficient documentation

## 2013-08-10 DIAGNOSIS — I482 Chronic atrial fibrillation, unspecified: Secondary | ICD-10-CM

## 2013-08-10 NOTE — Progress Notes (Signed)
Patient ID: Sara Russell, female   DOB: Dec 27, 1930, 80 y.o.   MRN: [id number]   Subjective:  HPI:   Sara Russell is a 80 y.o. female with a history of AFib, hypercholesterolemia, and anemia here for hospital follow up.  [name] was recently admitted to the [facility] 6/22-6/26 for chest pain and shortness of breath. Cardiac ischemia work up ruled out ACS with a nuclear medicine stress test showing diastolic dysfunction and EF 85%. She also required oxygen, which is new for her, presumably due to COPD exacerbation, and a chest xray showed LLL infiltrate which was treated with azythromycin. Chest pain and oxygen requirement had resolved by discharge. She also had atrial fibrillation and was discharged on eliquis and diltiazem 151m daily in addition to her previous metoprolol 1058mBID. She has cardiology follow up on 7/6.  She had asymptomatic, chronic, moderate hyponatremia which was stable.   She denies chest pain and shortness of breath since discharge. She has had a cough productive of white sputum that is resolving. She had no issues taking the azithromycin. Denies palpitations or signs/symptoms of bleeding. She has a 2-story home and her son has reservations about her ability to navigate safely. Her right ankle is painful at times, especially when it is more swollen at the end of the day. She will receive home health services, but they have yet to provide services. She has not fallen.   Review of Systems:  Per HPI. All other systems reviewed and are negative.    Past Medical History: Patient Active Problem List   Diagnosis Date Noted  . Hyponatremia 08/10/2013  . Chest pain 08/02/2013  . Atrial fibrillation with RVR 08/02/2013  . Acute upper respiratory infections of unspecified site 01/21/2013  . Left atrial enlargement 01/10/2013  . A-fib 12/08/2012  . Rosacea 05/15/2012  . Breast cancer screening 03/14/2011  . Anemia 08/28/2010  . Grief 04/29/2010  . DEGENERATIVE DISC DISEASE,  LUMBAR SPINE 07/14/2009  . DVT 07/11/2009  . GERD 07/11/2009  . PERSONAL HX COLONIC POLYPS 07/11/2009  . HYPERCHOLESTEROLEMIA 04/10/2006  . HYPERTENSION, BENIGN SYSTEMIC 04/10/2006  . PULMONARY EMBOLI 04/10/2006  . DIVERTICULOSIS OF COLON 04/10/2006  . ACTINIC KERATOSIS 04/10/2006  . GEN OSTEOARTHROSIS INVOLVING MULTIPLE SITES 04/10/2006  . OSTEOPENIA 04/10/2006    Medications: reviewed and updated Current Outpatient Prescriptions  Medication Sig Dispense Refill  . albuterol (PROVENTIL HFA;VENTOLIN HFA) 108 (90 BASE) MCG/ACT inhaler Inhale 2-4 puffs into the lungs every 6 (six) hours as needed for wheezing or shortness of breath.  1 Inhaler  2  . apixaban (ELIQUIS) 5 MG TABS tablet Take 1 tablet (5 mg total) by mouth 2 (two) times daily.  60 tablet  6  . aspirin 81 MG chewable tablet Chew 1 tablet (81 mg total) by mouth daily.  30 tablet  0  . atorvastatin (LIPITOR) 10 MG tablet Take 1 tablet (10 mg total) by mouth daily at 6 PM.  30 tablet  0  . benazepril (LOTENSIN) 40 MG tablet Take 1 tablet (40 mg total) by mouth daily.  90 tablet  3  . calcium carbonate (TUMS - DOSED IN MG ELEMENTAL CALCIUM) 500 MG chewable tablet Chew 1 tablet by mouth 2 (two) times daily.        . calcium-vitamin D (SM CALCIUM-VITAMIN D) 500-200 MG-UNIT per tablet Take 1 tablet by mouth 2 (two) times daily.       . Marland Kitcheniltiazem (CARDIZEM CD) 180 MG 24 hr capsule Take 1 capsule (180 mg total)  by mouth daily.  30 capsule  0  . ferrous sulfate 325 (65 FE) MG tablet Take 1 tablet (325 mg total) by mouth daily with breakfast.  1 tablet  0  . metoprolol (LOPRESSOR) 100 MG tablet Take 1 tablet (100 mg total) by mouth 2 (two) times daily.  180 tablet  3  . nitroGLYCERIN (NITROSTAT) 0.4 MG SL tablet Place 0.4 mg under the tongue every 5 (five) minutes as needed for chest pain.      . nitroGLYCERIN (NITROSTAT) 0.4 MG SL tablet Place 1 tablet (0.4 mg total) under the tongue every 5 (five) minutes as needed for chest pain.  50  tablet  3  . omeprazole (PRILOSEC) 40 MG capsule Take 1 capsule (40 mg total) by mouth daily.  90 capsule  3  . traMADol (ULTRAM) 50 MG tablet Take 50 mg by mouth at bedtime.      . [DISCONTINUED] omeprazole (PRILOSEC OTC) 20 MG tablet Take 20 mg by mouth 2 (two) times daily.         No current facility-administered medications for this visit.    Objective:  Physical Exam: BP 136/87  Pulse 90  Temp(Src) 98.2 F (36.8 C) (Oral)  Ht 5' 2.5" (1.588 m)  Wt 158 lb (71.668 kg)  BMI 28.42 kg/m2  SpO2 98%  Gen: Pleasant, elderly 80 y.o. female in NAD HEENT: MMM, EOMI, PERRL, anicteric sclerae CV: RRR, early systolic murmur, no JVD Resp: Non-labored, CTAB, no wheezes noted Abd: Soft, NTND, BS present, no guarding or organomegaly MSK: Trace pitting LE edema noted, full ROM Neuro: Alert and oriented, speech normal      Chemistry      Component Value Date/Time   NA 131* 08/10/2013 1619   K 4.2 08/10/2013 1619   CL 96 08/10/2013 1619   CO2 26 08/10/2013 1619   BUN 12 08/10/2013 1619   CREATININE 0.67 08/10/2013 1619   CREATININE 0.64 08/06/2013 0345      Component Value Date/Time   CALCIUM 8.4 08/10/2013 1619   ALKPHOS 48 05/18/2012 0839   AST 19 05/18/2012 0839   ALT 13 05/18/2012 0839   BILITOT 0.4 05/18/2012 0839      Lab Results  Component Value Date   WBC 9.9 08/02/2013   HGB 13.4 08/02/2013   HCT 38.6 08/02/2013   MCV 90.6 08/02/2013   PLT 320 08/02/2013   Lab Results  Component Value Date   TSH 1.240 08/02/2013   Lab Results  Component Value Date   HGBA1C 5.3 08/02/2013   Assessment:     Sara Russell is a 80 y.o. female here for hospital follow up.    Plan:     See problem list for problem-specific plans.

## 2013-08-10 NOTE — Assessment & Plan Note (Signed)
Rate controlled on metoprolol 145m BID and recently added CCB. BP also at goal.

## 2013-08-10 NOTE — Assessment & Plan Note (Signed)
Moderate chronic hyponatremia found during recent hospitalization (126-131). Appears euvolemic. Will recheck here.

## 2013-08-10 NOTE — Patient Instructions (Addendum)
Thank you for coming in today!  We are checking some labs today, and I will call you if they are abnormal. If you do not hear from me by phone or letter in 2 weeks, please call us as I may have been unable to reach you.   - As you leave, make an appointment to follow up with Dr. Valentina Lucks for pulmonary function tests here in the clinic.  - Elevated your legs toreduce swelling.   Take care and seek immediate care sooner if you develop chest pain, shortness of breath or palpitations.  Please feel free to call with any questions or concerns at any time, at [phone]. - Dr. Bonner Puna

## 2013-08-11 DIAGNOSIS — I4891 Unspecified atrial fibrillation: Secondary | ICD-10-CM | POA: Diagnosis not present

## 2013-08-11 DIAGNOSIS — K449 Diaphragmatic hernia without obstruction or gangrene: Secondary | ICD-10-CM | POA: Diagnosis not present

## 2013-08-11 DIAGNOSIS — J189 Pneumonia, unspecified organism: Secondary | ICD-10-CM | POA: Diagnosis not present

## 2013-08-11 DIAGNOSIS — E119 Type 2 diabetes mellitus without complications: Secondary | ICD-10-CM | POA: Diagnosis not present

## 2013-08-11 DIAGNOSIS — Z7901 Long term (current) use of anticoagulants: Secondary | ICD-10-CM | POA: Diagnosis not present

## 2013-08-11 DIAGNOSIS — R5381 Other malaise: Secondary | ICD-10-CM | POA: Diagnosis not present

## 2013-08-11 LAB — BASIC METABOLIC PANEL
BUN: 12 mg/dL (ref 6–23)
CO2: 26 mEq/L (ref 19–32)
Calcium: 8.4 mg/dL (ref 8.4–10.5)
Chloride: 96 mEq/L (ref 96–112)
Creat: 0.67 mg/dL (ref 0.50–1.10)
Glucose, Bld: 83 mg/dL (ref 70–99)
Potassium: 4.2 mEq/L (ref 3.5–5.3)
Sodium: 131 mEq/L — ABNORMAL LOW (ref 135–145)

## 2013-08-16 ENCOUNTER — Ambulatory Visit (INDEPENDENT_AMBULATORY_CARE_PROVIDER_SITE_OTHER): Payer: Medicare Other | Admitting: Physician Assistant

## 2013-08-16 ENCOUNTER — Encounter: Payer: Self-pay | Admitting: Physician Assistant

## 2013-08-16 VITALS — BP 114/60 | HR 95 | Ht 62.5 in | Wt 151.0 lb

## 2013-08-16 DIAGNOSIS — R7989 Other specified abnormal findings of blood chemistry: Secondary | ICD-10-CM | POA: Diagnosis not present

## 2013-08-16 DIAGNOSIS — M10422 Other secondary gout, left elbow: Secondary | ICD-10-CM

## 2013-08-16 DIAGNOSIS — I10 Essential (primary) hypertension: Secondary | ICD-10-CM | POA: Diagnosis not present

## 2013-08-16 DIAGNOSIS — M109 Gout, unspecified: Secondary | ICD-10-CM

## 2013-08-16 DIAGNOSIS — I5031 Acute diastolic (congestive) heart failure: Secondary | ICD-10-CM | POA: Diagnosis not present

## 2013-08-16 DIAGNOSIS — I482 Chronic atrial fibrillation, unspecified: Secondary | ICD-10-CM

## 2013-08-16 DIAGNOSIS — I4891 Unspecified atrial fibrillation: Secondary | ICD-10-CM | POA: Diagnosis not present

## 2013-08-16 DIAGNOSIS — E871 Hypo-osmolality and hyponatremia: Secondary | ICD-10-CM

## 2013-08-16 DIAGNOSIS — E790 Hyperuricemia without signs of inflammatory arthritis and tophaceous disease: Secondary | ICD-10-CM

## 2013-08-16 DIAGNOSIS — I5030 Unspecified diastolic (congestive) heart failure: Secondary | ICD-10-CM | POA: Insufficient documentation

## 2013-08-16 LAB — BASIC METABOLIC PANEL
BUN: 13 mg/dL (ref 6–23)
CO2: 25 mEq/L (ref 19–32)
Calcium: 8.8 mg/dL (ref 8.4–10.5)
Chloride: 95 mEq/L — ABNORMAL LOW (ref 96–112)
Creatinine, Ser: 0.8 mg/dL (ref 0.4–1.2)
GFR: 69.16 mL/min (ref >60.00)
Glucose, Bld: 96 mg/dL (ref 70–99)
Potassium: 3.9 mEq/L (ref 3.5–5.1)
Sodium: 130 mEq/L — ABNORMAL LOW (ref 135–145)

## 2013-08-16 LAB — URIC ACID: Uric Acid, Serum: 5.3 mg/dL (ref 2.4–7.0)

## 2013-08-16 MED ORDER — HYDROCHLOROTHIAZIDE 25 MG PO TABS: 12.5000 mg | ORAL_TABLET | Freq: Every day | ORAL | Status: DC

## 2013-08-16 MED ORDER — COLCHICINE 0.6 MG PO TABS: 0.6000 mg | ORAL_TABLET | Freq: Two times a day (BID) | ORAL | Status: DC

## 2013-08-16 NOTE — Patient Instructions (Addendum)
Your physician recommends that you schedule a follow-up appointment in: WITH DR. ALLRED IN 6 WEEKS  Your physician recommends that you return for lab work in: [place], BMET  Your physician has recommended you make the following change in your medication:   HYDROCHLOROTHIAZIDE (HCTZ) 25 MG TAKE 1/2 TABLETS ONCE A DAY  COLCHICINE 0.6 MG TWICE A DAY

## 2013-08-16 NOTE — Assessment & Plan Note (Signed)
Patient has edema. She also has history of hyponatremia. Will start low-dose HCTZ 12.5 mg once daily. Hopefully this will help and not worsen her suspected gout.

## 2013-08-16 NOTE — Assessment & Plan Note (Signed)
Check labs today and again next week after starting diuretic.

## 2013-08-16 NOTE — Assessment & Plan Note (Signed)
Patient has never had gout before but her left elbow is red and swollen and tender. We'll start cultures seen 0.6 mg twice a day. Recommend followup with her primary M.D. next week. We'll check a uric acid level today as well.

## 2013-08-16 NOTE — Progress Notes (Signed)
HPI: This is an 80 year old female patient who was admitted to the hospital with chest pain and rapid atrial fibrillation. Nuclear stress test ejection fraction was 85% and showed no ischemia. Her atrial fibrillation was controlled with a diltiazem drip and she was transitioned to 180 mg daily and metoprolol 100 mg twice a day. She was also given a dose of Lasix for her heart failure. She does have chronic hyponatremia that usually hovers around 130 and it remains stable at 126 after her a trial of Lasix. She was asymptomatic with this.  The patient comes in today complaining of bilateral ankle swelling as well as left elbow swelling redness and soreness. Her left elbow is quite painful. She's never had gout before. Her breathing is stable. She's had no further chest pain or rapid heart beats. She is having difficulty walking because of the swelling in her ankles.  Allergies -- Iohexol -- Other (See Comments)   --  unknown  -- Ivp Dye [Iodinated Diagnostic Agents] -- Hives  -- Scallops [Shellfish Allergy] -- Other (See Comments)   --  Projectile vomiting.  -- Sulfa Antibiotics -- Hives  -- Sulfamethoxazole -- Hives   --  REACTION: unspecified  Current Outpatient Prescriptions on File Prior to Visit: albuterol (PROVENTIL HFA;VENTOLIN HFA) 108 (90 BASE) MCG/ACT inhaler, Inhale 2-4 puffs into the lungs every 6 (six) hours as needed for wheezing or shortness of breath., Disp: 1 Inhaler, Rfl: 2 apixaban (ELIQUIS) 5 MG TABS tablet, Take 1 tablet (5 mg total) by mouth 2 (two) times daily., Disp: 60 tablet, Rfl: 6 aspirin 81 MG chewable tablet, Chew 1 tablet (81 mg total) by mouth daily., Disp: 30 tablet, Rfl: 0 atorvastatin (LIPITOR) 10 MG tablet, Take 1 tablet (10 mg total) by mouth daily at 6 PM., Disp: 30 tablet, Rfl: 0 benazepril (LOTENSIN) 40 MG tablet, Take 1 tablet (40 mg total) by mouth daily., Disp: 90 tablet, Rfl: 3 calcium carbonate (TUMS - DOSED IN MG ELEMENTAL CALCIUM) 500 MG chewable  tablet, Chew 1 tablet by mouth 2 (two) times daily.  , Disp: , Rfl:  calcium-vitamin D (SM CALCIUM-VITAMIN D) 500-200 MG-UNIT per tablet, Take 1 tablet by mouth 2 (two) times daily. , Disp: , Rfl:  diltiazem (CARDIZEM CD) 180 MG 24 hr capsule, Take 1 capsule (180 mg total) by mouth daily., Disp: 30 capsule, Rfl: 0 ferrous sulfate 325 (65 FE) MG tablet, Take 1 tablet (325 mg total) by mouth daily with breakfast., Disp: 1 tablet, Rfl: 0 metoprolol (LOPRESSOR) 100 MG tablet, Take 1 tablet (100 mg total) by mouth 2 (two) times daily., Disp: 180 tablet, Rfl: 3 nitroGLYCERIN (NITROSTAT) 0.4 MG SL tablet, Place 1 tablet (0.4 mg total) under the tongue every 5 (five) minutes as needed for chest pain., Disp: 50 tablet, Rfl: 3 omeprazole (PRILOSEC) 40 MG capsule, Take 1 capsule (40 mg total) by mouth daily., Disp: 90 capsule, Rfl: 3 traMADol (ULTRAM) 50 MG tablet, Take 50 mg by mouth at bedtime., Disp: , Rfl:  [DISCONTINUED] omeprazole (PRILOSEC OTC) 20 MG tablet, Take 20 mg by mouth 2 (two) times daily.  , Disp: , Rfl:   No current facility-administered medications on file prior to visit.   Past Medical History:   S/P dilatation of esophageal stricture          2002         Hypertension  Hyperlipidemia                                               DVT (deep venous thrombosis)                    2009         PE (pulmonary embolism)                         2009         GERD (gastroesophageal reflux disease)                       Diverticulosis                                               Iron deficiency anemia                                       Hyperplastic colon polyp                                     Positive H. pylori test                         01/20/96     Degenerative joint disease                                   Skin cancer                                                    Comment:h/o basal and squamous skin cancer--> removed    Persistent atrial fibrillation                              Past Surgical History:   DILATION AND CURETTAGE OF UTERUS                                Comment:for endometrial polyps   REPLACEMENT TOTAL KNEE                                          Comment:left   TONSILLECTOMY                                                 APPENDECTOMY  BREAST LUMPECTOMY                                               Comment:benign  Review of patient's family history indicates:   Esophageal cancer              Brother                  Diverticulitis                 Brother                  Colon cancer                   Neg Hx                   Heart disease                  Mother                   Stroke                         Mother                   Social History   Marital Status: Widowed             Spouse Name:                      Years of Education: college         Number of children: 3           Occupational History Occupation          Fish farm manager            Comment              retired Marine scientist                             Social History Main Topics   Smoking Status: Former Smoker                   Packs/Day: 0.00  Years:           Quit date: 02/11/1961   Smokeless Status: Never Used                       Alcohol Use: Yes           0.0 oz/week      7-14 Glasses of wine per week      Comment: 1-2 glasses of wine per day   Drug Use: No             Sexual Activity: Not Currently      Other Topics            Concern   None on file  Social History Narrative   Health Care POA:    Emergency Contact: son, Brailee Riede (c) [phone] [name])   End of Life Plan:    Who lives with you: self   Any pets: none   Diet: Pt has a varied diet of protein, starch, vegetables.   Exercise: Pt has no regular exercise plan  because of pain in back.   Seatbelts: Pt reports wearing seatbelt when in vehicle.   Nancy Fetter Exposure/Protection: Pt reports wearing sun  screen.   Hobbies: reading, traveling, eating out.    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 Neck: No JVD, HJR, Bruit, or thyroid enlargement  Lungs: No tachypnea, clear without wheezing, rales, or rhonchi  Cardiovascular: Irregular irregular, PMI not displaced,  no murmurs, gallops, bruit, thrill, or heave.  Abdomen: BS normal. Soft without organomegaly, masses, lesions or tenderness.  Extremities: +1 ankle edema and right greater than left, otherwise lower extremities without cyanosis, clubbing. Good distal pulses bilateral. Left elbow is swollen and red and tender to touch.  SKin: Warm, no lesions or rashes   Musculoskeletal: No deformities  Neuro: no focal signs  BP 114/60  Pulse 95  Ht 5' 2.5" (1.588 m)  Wt 151 lb (68.493 kg)  BMI 27.16 kg/m2   EKG: Atrial fibrillation at 95 beats per minute poor R-wave progression, no acute change  2-D echo 12/29/12  Study Conclusions  - Left ventricle: The cavity size was normal. Wall thickness   was normal. Systolic function was normal. The estimated   ejection fraction was in the range of 55% to 60%. Wall   motion was normal; there were no regional wall motion   abnormalities. The study is not technically sufficient to   allow evaluation of LV diastolic function. - Mitral valve: Calcified annulus. - Left atrium: The atrium was severely dilated. - Right atrium: The atrium was mildly dilated. - Atrial septum: No defect or patent foramen ovale was   identified. - Tricuspid valve: Moderate regurgitation. - Pulmonary arteries: Systolic pressure was mildly   increased. PA peak pressure: 77m Hg (S).

## 2013-08-16 NOTE — Assessment & Plan Note (Signed)
Patient's atrial fib is better controlled on diltiazem and metoprolol. Patient is also on Eliquis.

## 2013-08-17 DIAGNOSIS — K449 Diaphragmatic hernia without obstruction or gangrene: Secondary | ICD-10-CM | POA: Diagnosis not present

## 2013-08-17 DIAGNOSIS — Z7901 Long term (current) use of anticoagulants: Secondary | ICD-10-CM | POA: Diagnosis not present

## 2013-08-17 DIAGNOSIS — E119 Type 2 diabetes mellitus without complications: Secondary | ICD-10-CM | POA: Diagnosis not present

## 2013-08-17 DIAGNOSIS — J189 Pneumonia, unspecified organism: Secondary | ICD-10-CM | POA: Diagnosis not present

## 2013-08-17 DIAGNOSIS — I4891 Unspecified atrial fibrillation: Secondary | ICD-10-CM | POA: Diagnosis not present

## 2013-08-17 DIAGNOSIS — R5381 Other malaise: Secondary | ICD-10-CM | POA: Diagnosis not present

## 2013-08-19 DIAGNOSIS — E119 Type 2 diabetes mellitus without complications: Secondary | ICD-10-CM | POA: Diagnosis not present

## 2013-08-19 DIAGNOSIS — K449 Diaphragmatic hernia without obstruction or gangrene: Secondary | ICD-10-CM | POA: Diagnosis not present

## 2013-08-19 DIAGNOSIS — J189 Pneumonia, unspecified organism: Secondary | ICD-10-CM | POA: Diagnosis not present

## 2013-08-19 DIAGNOSIS — R5381 Other malaise: Secondary | ICD-10-CM | POA: Diagnosis not present

## 2013-08-19 DIAGNOSIS — I4891 Unspecified atrial fibrillation: Secondary | ICD-10-CM | POA: Diagnosis not present

## 2013-08-19 DIAGNOSIS — Z7901 Long term (current) use of anticoagulants: Secondary | ICD-10-CM | POA: Diagnosis not present

## 2013-08-23 ENCOUNTER — Telehealth: Payer: Self-pay | Admitting: Family Medicine

## 2013-08-23 NOTE — Telephone Encounter (Signed)
Stopped both HCTZ and cochicine.  She will monitor BP.

## 2013-08-23 NOTE — Telephone Encounter (Signed)
Pt called because since getting out of the hospital she was prescribed Colchicine 0.6 mg twice a day. The issue she is having is that her BP is going down when she takes it 116, 106, 100. She has not taken the medication for three nights in a row now and today she is not going to take it at all until Dr. Andria Frames calls he back. Please use the cell number [phone]. jw

## 2013-08-25 ENCOUNTER — Ambulatory Visit (INDEPENDENT_AMBULATORY_CARE_PROVIDER_SITE_OTHER): Payer: Medicare Other | Admitting: *Deleted

## 2013-08-25 DIAGNOSIS — R7989 Other specified abnormal findings of blood chemistry: Secondary | ICD-10-CM | POA: Diagnosis not present

## 2013-08-25 DIAGNOSIS — I10 Essential (primary) hypertension: Secondary | ICD-10-CM | POA: Diagnosis not present

## 2013-08-25 LAB — BASIC METABOLIC PANEL
BUN: 17 mg/dL (ref 6–23)
CO2: 24 mEq/L (ref 19–32)
Calcium: 8.7 mg/dL (ref 8.4–10.5)
Chloride: 95 mEq/L — ABNORMAL LOW (ref 96–112)
Creatinine, Ser: 1.1 mg/dL (ref 0.4–1.2)
GFR: 49.11 mL/min — ABNORMAL LOW (ref >60.00)
Glucose, Bld: 101 mg/dL — ABNORMAL HIGH (ref 70–99)
Potassium: 3.6 mEq/L (ref 3.5–5.1)
Sodium: 129 mEq/L — ABNORMAL LOW (ref 135–145)

## 2013-08-25 LAB — URIC ACID: Uric Acid, Serum: 8.8 mg/dL — ABNORMAL HIGH (ref 2.4–7.0)

## 2013-08-26 ENCOUNTER — Ambulatory Visit (INDEPENDENT_AMBULATORY_CARE_PROVIDER_SITE_OTHER): Payer: Medicare Other | Admitting: Pharmacist

## 2013-08-26 ENCOUNTER — Encounter: Payer: Self-pay | Admitting: Pharmacist

## 2013-08-26 ENCOUNTER — Telehealth: Payer: Self-pay | Admitting: *Deleted

## 2013-08-26 VITALS — BP 124/64 | HR 60 | Ht 61.5 in | Wt 151.0 lb

## 2013-08-26 DIAGNOSIS — R0602 Shortness of breath: Secondary | ICD-10-CM | POA: Diagnosis not present

## 2013-08-26 DIAGNOSIS — R5381 Other malaise: Secondary | ICD-10-CM | POA: Diagnosis not present

## 2013-08-26 DIAGNOSIS — K449 Diaphragmatic hernia without obstruction or gangrene: Secondary | ICD-10-CM | POA: Diagnosis not present

## 2013-08-26 DIAGNOSIS — I4891 Unspecified atrial fibrillation: Secondary | ICD-10-CM | POA: Diagnosis not present

## 2013-08-26 DIAGNOSIS — E119 Type 2 diabetes mellitus without complications: Secondary | ICD-10-CM | POA: Diagnosis not present

## 2013-08-26 DIAGNOSIS — J189 Pneumonia, unspecified organism: Secondary | ICD-10-CM | POA: Diagnosis not present

## 2013-08-26 DIAGNOSIS — Z7901 Long term (current) use of anticoagulants: Secondary | ICD-10-CM | POA: Diagnosis not present

## 2013-08-26 NOTE — Assessment & Plan Note (Signed)
Spirometry evaluation with Pre and Post Bronchodilator reveals normal lung function in patient with shortness of breath prior to last hospitalization which has resolved since hospitalization.   PRN albuterol was encouraged if symptoms returned.   Appears low risk for chronic lung disease due to tobacco given distant past history of use.  no change in treatment plan at this time.   Reviewed results of pulmonary function tests.  Pt verbalized understanding of results and education.  Written pt instructions provided.  F/U Clinic visit PRN with Dr Andria Frames.   Total time in face to face counseling 25 minutes.

## 2013-08-26 NOTE — Progress Notes (Signed)
Patient ID: Sara Russell, female   DOB: 1930/03/05, 80 y.o.   MRN: [id number] Reviewed: Agree with Dr. Graylin Shiver documentation and management.

## 2013-08-26 NOTE — Telephone Encounter (Signed)
LEFT MESSAGE RE  LAB RESULTS  NEED TO REVIEW./CY

## 2013-08-26 NOTE — Patient Instructions (Addendum)
Normal Spirometry today.   No change in medications today.   Next visit with Dr. Andria Frames when needed.

## 2013-08-26 NOTE — Telephone Encounter (Signed)
PT  AWARE OF LAB RESULTS  COPY SENT  TO DR HENSEL FOR  REVIEW  UNABLE TO REACH HIS  NURSE AFTER  SEVERAL ATTEMPTS./CY

## 2013-08-26 NOTE — Progress Notes (Signed)
S:    Patient arrives in good spirits.    Presents for lung function evaluation.  Patient reports breathing has been short of breath when hospitalized however has not felt short of breath or used albuterol rescue inhaler since day after discharge.    O:  See "scanned report" or Documentation Flowsheet (discrete results - PFTs) for  Spirometry results. Patient provided good effort while attempting spirometry.    A/P: Spirometry evaluation with Pre and Post Bronchodilator reveals normal lung function in patient with shortness of breath prior to last hospitalization which has resolved since hospitalization.   PRN albuterol was encouraged if symptoms returned.   Appears low risk for chronic lung disease due to tobacco given distant past history of use.  no change in treatment plan at this time.   Reviewed results of pulmonary function tests.  Pt verbalized understanding of results and education.  Written pt instructions provided.  F/U Clinic visit PRN with Dr Andria Frames.   Total time in face to face counseling 25 minutes.

## 2013-09-08 DIAGNOSIS — K449 Diaphragmatic hernia without obstruction or gangrene: Secondary | ICD-10-CM | POA: Diagnosis not present

## 2013-09-08 DIAGNOSIS — Z7901 Long term (current) use of anticoagulants: Secondary | ICD-10-CM | POA: Diagnosis not present

## 2013-09-08 DIAGNOSIS — J189 Pneumonia, unspecified organism: Secondary | ICD-10-CM | POA: Diagnosis not present

## 2013-09-08 DIAGNOSIS — E119 Type 2 diabetes mellitus without complications: Secondary | ICD-10-CM | POA: Diagnosis not present

## 2013-09-08 DIAGNOSIS — I4891 Unspecified atrial fibrillation: Secondary | ICD-10-CM | POA: Diagnosis not present

## 2013-09-08 DIAGNOSIS — R5381 Other malaise: Secondary | ICD-10-CM | POA: Diagnosis not present

## 2013-09-10 ENCOUNTER — Encounter: Payer: Self-pay | Admitting: Family Medicine

## 2013-09-10 ENCOUNTER — Ambulatory Visit (INDEPENDENT_AMBULATORY_CARE_PROVIDER_SITE_OTHER): Payer: Medicare Other | Admitting: Family Medicine

## 2013-09-10 VITALS — BP 132/74 | HR 70 | Temp 97.9°F | Ht 62.0 in | Wt 157.0 lb

## 2013-09-10 DIAGNOSIS — Z23 Encounter for immunization: Secondary | ICD-10-CM

## 2013-09-10 DIAGNOSIS — I10 Essential (primary) hypertension: Secondary | ICD-10-CM | POA: Diagnosis not present

## 2013-09-10 DIAGNOSIS — I509 Heart failure, unspecified: Secondary | ICD-10-CM | POA: Diagnosis not present

## 2013-09-10 DIAGNOSIS — I4891 Unspecified atrial fibrillation: Secondary | ICD-10-CM

## 2013-09-10 DIAGNOSIS — I5030 Unspecified diastolic (congestive) heart failure: Secondary | ICD-10-CM

## 2013-09-10 DIAGNOSIS — D500 Iron deficiency anemia secondary to blood loss (chronic): Secondary | ICD-10-CM

## 2013-09-10 DIAGNOSIS — M10019 Idiopathic gout, unspecified shoulder: Secondary | ICD-10-CM

## 2013-09-10 DIAGNOSIS — M109 Gout, unspecified: Secondary | ICD-10-CM | POA: Diagnosis not present

## 2013-09-10 DIAGNOSIS — I482 Chronic atrial fibrillation, unspecified: Secondary | ICD-10-CM

## 2013-09-10 LAB — COMPREHENSIVE METABOLIC PANEL
ALT: 11 U/L (ref 0–35)
AST: 18 U/L (ref 0–37)
Albumin: 3.3 g/dL — ABNORMAL LOW (ref 3.5–5.2)
Alkaline Phosphatase: 123 U/L — ABNORMAL HIGH (ref 39–117)
BUN: 11 mg/dL (ref 6–23)
CO2: 28 mEq/L (ref 19–32)
Calcium: 8.8 mg/dL (ref 8.4–10.5)
Chloride: 99 mEq/L (ref 96–112)
Creat: 0.83 mg/dL (ref 0.50–1.10)
Glucose, Bld: 87 mg/dL (ref 70–99)
Potassium: 4 mEq/L (ref 3.5–5.3)
Sodium: 136 mEq/L (ref 135–145)
Total Bilirubin: 0.6 mg/dL (ref 0.2–1.2)
Total Protein: 6.1 g/dL (ref 6.0–8.3)

## 2013-09-10 LAB — CBC
HCT: 36.4 % (ref 36.0–46.0)
Hemoglobin: 12.2 g/dL (ref 12.0–15.0)
MCH: 30 pg (ref 26.0–34.0)
MCHC: 33.5 g/dL (ref 30.0–36.0)
MCV: 89.4 fL (ref 78.0–100.0)
Platelets: 250 10*3/uL (ref 150–400)
RBC: 4.07 MIL/uL (ref 3.87–5.11)
RDW: 14 % (ref 11.5–15.5)
WBC: 6.6 10*3/uL (ref 4.0–10.5)

## 2013-09-10 LAB — URIC ACID: Uric Acid, Serum: 6.5 mg/dL (ref 2.4–7.0)

## 2013-09-10 MED ORDER — ATORVASTATIN CALCIUM 10 MG PO TABS: 10.0000 mg | ORAL_TABLET | Freq: Every day | ORAL | Status: DC

## 2013-09-10 NOTE — Assessment & Plan Note (Signed)
Good control off diuretic.

## 2013-09-10 NOTE — Assessment & Plan Note (Signed)
Rate controled

## 2013-09-10 NOTE — Assessment & Plan Note (Signed)
CBC.  Also DC ASA which was for primary prevention of CAD, CVA.  She is now on eliquis and has known chronic slow GI bleed.  Feel risk benefit ratio favors no ASA.

## 2013-09-10 NOTE — Assessment & Plan Note (Signed)
I am a little worried about the edema - but hesitant to restart diuretic due to gout.

## 2013-09-10 NOTE — Patient Instructions (Signed)
I would like you to get a scale and keep an eye on your weight.  I will try to not put you back on a diuretic, but I may have to.  By my scales, your "dry weight is 151 lbs.  I don't want you getting higher than 160. Really watch the sodium I refilled the atorvastatin.  Have the pharm request refills of any other needed chronic meds. I will call you Monday with the blood results.

## 2013-09-10 NOTE — Progress Notes (Signed)
   Subjective:    Patient ID: Sara Russell, female    DOB: 07/11/1930, 80 y.o.   MRN: [id number]  HPI FU hospitalization.  Chest pain with RVR.  On eliquis.  Was deconditioned and is slowly regaining strength.  Has some wt gain (has of CHF)  No SOB.  Diuretics DCed because of gout.   Asks about eliquis and ASA. Fatique - due for CBC recheck for chronic GI blood loss.     Review of Systems     Objective:   Physical Exam Lungs clear Cardiac rate controled.  No m or g.  Slightly irregular, likely still in a fib. Abd benign Ext 2+ edema.       Assessment & Plan:

## 2013-09-21 ENCOUNTER — Telehealth: Payer: Self-pay | Admitting: Family Medicine

## 2013-09-21 DIAGNOSIS — I4891 Unspecified atrial fibrillation: Secondary | ICD-10-CM | POA: Diagnosis not present

## 2013-09-21 DIAGNOSIS — Z7901 Long term (current) use of anticoagulants: Secondary | ICD-10-CM | POA: Diagnosis not present

## 2013-09-21 DIAGNOSIS — E119 Type 2 diabetes mellitus without complications: Secondary | ICD-10-CM | POA: Diagnosis not present

## 2013-09-21 DIAGNOSIS — K449 Diaphragmatic hernia without obstruction or gangrene: Secondary | ICD-10-CM | POA: Diagnosis not present

## 2013-09-21 DIAGNOSIS — J189 Pneumonia, unspecified organism: Secondary | ICD-10-CM | POA: Diagnosis not present

## 2013-09-21 DIAGNOSIS — R5381 Other malaise: Secondary | ICD-10-CM | POA: Diagnosis not present

## 2013-09-21 NOTE — Telephone Encounter (Signed)
Pt called because the pharmacy is sending request for her medications but have not heard from [place]. One of the medications was diltiazem and she wasn't sure which other one was faxed or called in. jw

## 2013-09-22 MED ORDER — DILTIAZEM HCL ER COATED BEADS 180 MG PO CP24: 180.0000 mg | ORAL_CAPSULE | Freq: Every day | ORAL | Status: DC

## 2013-09-22 NOTE — Telephone Encounter (Signed)
Rx sent to [facility].

## 2013-09-22 NOTE — Telephone Encounter (Signed)
LVM for patient to call back. ?

## 2013-09-23 NOTE — Telephone Encounter (Signed)
LVM informing of below

## 2013-09-27 ENCOUNTER — Ambulatory Visit: Payer: Medicare Other | Admitting: Nurse Practitioner

## 2013-09-29 ENCOUNTER — Ambulatory Visit (INDEPENDENT_AMBULATORY_CARE_PROVIDER_SITE_OTHER): Payer: Medicare Other | Admitting: Internal Medicine

## 2013-09-29 ENCOUNTER — Encounter: Payer: Self-pay | Admitting: Internal Medicine

## 2013-09-29 VITALS — BP 138/80 | HR 74 | Ht 62.0 in | Wt 155.0 lb

## 2013-09-29 DIAGNOSIS — I4891 Unspecified atrial fibrillation: Secondary | ICD-10-CM

## 2013-09-29 DIAGNOSIS — R0602 Shortness of breath: Secondary | ICD-10-CM

## 2013-09-29 NOTE — Progress Notes (Signed)
PCP:  Zigmund Gottron, MD  The patient presents today for routine electrophysiology followup.  Since last being seen in our clinic, the patient reports doing well.  She remains active for her age.  She was hospitalized in June for pneumonia and had a nuclear scan which showed normal EF and no ischemia. She is unaware of her afib and overall feels well. Had recent cbc and metc, with creat at 0.83 and H&H normal.  Today, she denies symptoms of palpitations, chest pain, shortness of breath, orthopnea, PND,  Positive for some lower extremity edema, no dizziness, presyncope, syncope, or neurologic sequela.  The patient feels that she is tolerating medications without difficulties and is otherwise without complaint today.   Past Medical History  Diagnosis Date  . S/P dilatation of esophageal stricture 2002  . Hypertension   . Hyperlipidemia   . DVT (deep venous thrombosis) 2009  . PE (pulmonary embolism) 2009  . GERD (gastroesophageal reflux disease)   . Diverticulosis   . Iron deficiency anemia   . Hyperplastic colon polyp   . Positive H. pylori test 01/20/96  . Degenerative joint disease   . Skin cancer     h/o basal and squamous skin cancer--> removed  . Persistent atrial fibrillation    Past Surgical History  Procedure Laterality Date  . Dilation and curettage of uterus      for endometrial polyps  . Replacement total knee      left  . Tonsillectomy    . Appendectomy    . Breast lumpectomy      benign    Current Outpatient Prescriptions  Medication Sig Dispense Refill  . apixaban (ELIQUIS) 5 MG TABS tablet Take 1 tablet (5 mg total) by mouth 2 (two) times daily.  60 tablet  6  . atorvastatin (LIPITOR) 10 MG tablet Take 1 tablet (10 mg total) by mouth daily at 6 PM.  90 tablet  3  . benazepril (LOTENSIN) 40 MG tablet Take 1 tablet (40 mg total) by mouth daily.  90 tablet  3  . calcium carbonate (TUMS - DOSED IN MG ELEMENTAL CALCIUM) 500 MG chewable tablet Chew 1 tablet  by mouth 2 (two) times daily.        . calcium-vitamin D (SM CALCIUM-VITAMIN D) 500-200 MG-UNIT per tablet Take 1 tablet by mouth 2 (two) times daily.       Marland Kitchen diltiazem (CARDIZEM CD) 180 MG 24 hr capsule Take 1 capsule (180 mg total) by mouth daily.  90 capsule  3  . ferrous sulfate 325 (65 FE) MG tablet Take 1 tablet (325 mg total) by mouth daily with breakfast.  1 tablet  0  . metoprolol (LOPRESSOR) 100 MG tablet Take 1 tablet (100 mg total) by mouth 2 (two) times daily.  180 tablet  3  . omeprazole (PRILOSEC) 40 MG capsule Take 1 capsule (40 mg total) by mouth daily.  90 capsule  3  . traMADol (ULTRAM) 50 MG tablet Take 50 mg by mouth at bedtime.      . [DISCONTINUED] omeprazole (PRILOSEC OTC) 20 MG tablet Take 20 mg by mouth 2 (two) times daily.         No current facility-administered medications for this visit.    Allergies  Allergen Reactions  . Iohexol Other (See Comments)    unknown  . Ivp Dye [Iodinated Diagnostic Agents] Hives  . Scallops [Shellfish Allergy] Other (See Comments)    Projectile vomiting.  . Sulfa Antibiotics Hives  . Sulfamethoxazole Hives  REACTION: unspecified    History   Social History  . Marital Status: Widowed    Spouse Name: N/A    Number of Children: 3  . Years of Education: college   Occupational History  . retired Marine scientist    Social History Main Topics  . Smoking status: Former Smoker -- 0.50 packs/day for 13 years    Types: Cigarettes    Start date: 02/12/1948    Quit date: 02/11/1961  . Smokeless tobacco: Never Used     Comment: Denies use of tobacco since 1963  . Alcohol Use: 0.0 oz/week    7-14 Glasses of wine per week     Comment: 1-2 glasses of wine per day  . Drug Use: No  . Sexual Activity: Not Currently   Other Topics Concern  . Not on file   Social History Narrative   Health Care POA:    Emergency Contact: son, Jewelene Mairena (c) [phone] [name])   End of Life Plan:    Who lives with you: self   Any pets: none    Diet: Pt has a varied diet of protein, starch, vegetables.   Exercise: Pt has no regular exercise plan because of pain in back.   Seatbelts: Pt reports wearing seatbelt when in vehicle.   Nancy Fetter Exposure/Protection: Pt reports wearing sun screen.   Hobbies: reading, traveling, eating out.      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 Esophageal cancer Brother   . Diverticulitis Brother   . Colon cancer Neg Hx   . Heart disease Mother   . Stroke Mother     ROS-  All systems are reviewed and are negative except as outlined in the HPI above  Physical Exam: Filed Vitals:   09/29/13 1335  BP: 138/80  Pulse: 74  Height: 5' 2"  (1.575 m)  Weight: 70.308 kg (155 lb)    GEN- The patient is well appearing, alert and oriented x 3 today.   Head- normocephalic, atraumatic Eyes-  Sclera clear, conjunctiva pink Ears- hearing intact Oropharynx- clear Neck- supple, no JVP Lymph- no cervical lymphadenopathy Lungs- Clear to ausculation bilaterally, normal work of breathing Heart- irregular rate and rhythm, no murmurs, rubs or gallops, PMI not laterally displaced GI- soft, NT, ND, + BS Extremities- no clubbing, cyanosis, or edema Neuro- strength and sensation are intact  ekg today reveas afib  Rate controlled 74 bpm with QTc at 456m.  Assessment and Plan:  1. Permanent afib V rates are stable Continue eliquis.  Creatinine is stable.  2. HTN Stable   No change required today  Recheck afib clinic in 6 months.

## 2013-09-29 NOTE — Patient Instructions (Signed)
Your physician wants you to follow-up in: [place].  You will receive a reminder letter in the mail two months in advance. If you don't receive a letter, please call our office to schedule the follow-up appointment. Your physician recommends that you schedule a follow-up appointment in: [place], NP AT [place] AT [facility]  WE WILL BE IN TOUCH TO SCHEDULE THAT APPOINTMENT WITH YOU.

## 2013-10-11 DIAGNOSIS — H2510 Age-related nuclear cataract, unspecified eye: Secondary | ICD-10-CM | POA: Diagnosis not present

## 2013-10-26 ENCOUNTER — Telehealth: Payer: Self-pay | Admitting: Internal Medicine

## 2013-10-26 NOTE — Telephone Encounter (Signed)
New message     Pt takes eliquis bid.  She has a dentist appt at 2pm.  She is having trouble with her crown.  The dentist will take it off and look to see if any dental work needs to be done.  Should she take her eliquis this am?

## 2013-10-26 NOTE — Telephone Encounter (Signed)
Spoke with patient. Her dentist is aware that she is on Eliquis and has not asked her to hold but she says she will feel better not taking this morning and restarting this afternoon.  She does not have a history of CVA and missing one dose I don't know will have any effect on her dental work at [employer].  She says she would feel better not to take it and will resume tonight her regular pm dose

## 2013-11-29 ENCOUNTER — Ambulatory Visit (INDEPENDENT_AMBULATORY_CARE_PROVIDER_SITE_OTHER): Payer: Medicare Other | Admitting: *Deleted

## 2013-11-29 DIAGNOSIS — Z23 Encounter for immunization: Secondary | ICD-10-CM

## 2013-12-03 ENCOUNTER — Other Ambulatory Visit: Payer: Self-pay | Admitting: Family Medicine

## 2013-12-03 MED ORDER — DILTIAZEM HCL ER COATED BEADS 180 MG PO CP24: 180.0000 mg | ORAL_CAPSULE | Freq: Every day | ORAL | Status: DC

## 2013-12-03 NOTE — Telephone Encounter (Signed)
Refill request for diltiazem. Pt is requesting that is one medication be sent to [facility] on [address] bc she can get it for half the amount she pays at [facility]. Pls advise.

## 2014-01-03 ENCOUNTER — Other Ambulatory Visit: Payer: Self-pay | Admitting: Family Medicine

## 2014-01-29 ENCOUNTER — Other Ambulatory Visit: Payer: Self-pay | Admitting: Family Medicine

## 2014-01-29 DIAGNOSIS — M159 Polyosteoarthritis, unspecified: Secondary | ICD-10-CM

## 2014-01-31 NOTE — Assessment & Plan Note (Signed)
refill 

## 2014-02-25 DIAGNOSIS — D2272 Melanocytic nevi of left lower limb, including hip: Secondary | ICD-10-CM | POA: Diagnosis not present

## 2014-02-25 DIAGNOSIS — D1801 Hemangioma of skin and subcutaneous tissue: Secondary | ICD-10-CM | POA: Diagnosis not present

## 2014-02-25 DIAGNOSIS — L821 Other seborrheic keratosis: Secondary | ICD-10-CM | POA: Diagnosis not present

## 2014-02-25 DIAGNOSIS — L817 Pigmented purpuric dermatosis: Secondary | ICD-10-CM | POA: Diagnosis not present

## 2014-02-25 DIAGNOSIS — Z85828 Personal history of other malignant neoplasm of skin: Secondary | ICD-10-CM | POA: Diagnosis not present

## 2014-02-25 DIAGNOSIS — D225 Melanocytic nevi of trunk: Secondary | ICD-10-CM | POA: Diagnosis not present

## 2014-02-25 DIAGNOSIS — L814 Other melanin hyperpigmentation: Secondary | ICD-10-CM | POA: Diagnosis not present

## 2014-03-15 ENCOUNTER — Encounter: Payer: Self-pay | Admitting: Internal Medicine

## 2014-03-20 ENCOUNTER — Emergency Department (HOSPITAL_COMMUNITY)
Admission: EM | Admit: 2014-03-20 | Discharge: 2014-03-20 | Disposition: A | Discharge status: Home / Self Care | Payer: Medicare Other | Attending: Emergency Medicine | Admitting: Emergency Medicine

## 2014-03-20 ENCOUNTER — Encounter (HOSPITAL_COMMUNITY): Payer: Self-pay | Admitting: Emergency Medicine

## 2014-03-20 ENCOUNTER — Emergency Department (HOSPITAL_COMMUNITY): Payer: Medicare Other

## 2014-03-20 DIAGNOSIS — E785 Hyperlipidemia, unspecified: Secondary | ICD-10-CM | POA: Insufficient documentation

## 2014-03-20 DIAGNOSIS — S0993XA Unspecified injury of face, initial encounter: Secondary | ICD-10-CM | POA: Diagnosis not present

## 2014-03-20 DIAGNOSIS — Z87891 Personal history of nicotine dependence: Secondary | ICD-10-CM | POA: Diagnosis not present

## 2014-03-20 DIAGNOSIS — S32591A Other specified fracture of right pubis, initial encounter for closed fracture: Secondary | ICD-10-CM | POA: Insufficient documentation

## 2014-03-20 DIAGNOSIS — Y9301 Activity, walking, marching and hiking: Secondary | ICD-10-CM | POA: Diagnosis not present

## 2014-03-20 DIAGNOSIS — Z7902 Long term (current) use of antithrombotics/antiplatelets: Secondary | ICD-10-CM | POA: Diagnosis not present

## 2014-03-20 DIAGNOSIS — I481 Persistent atrial fibrillation: Secondary | ICD-10-CM | POA: Diagnosis not present

## 2014-03-20 DIAGNOSIS — Z86010 Personal history of colonic polyps: Secondary | ICD-10-CM | POA: Diagnosis not present

## 2014-03-20 DIAGNOSIS — Z01818 Encounter for other preprocedural examination: Secondary | ICD-10-CM | POA: Diagnosis not present

## 2014-03-20 DIAGNOSIS — Z9889 Other specified postprocedural states: Secondary | ICD-10-CM | POA: Insufficient documentation

## 2014-03-20 DIAGNOSIS — S4991XA Unspecified injury of right shoulder and upper arm, initial encounter: Secondary | ICD-10-CM | POA: Insufficient documentation

## 2014-03-20 DIAGNOSIS — Z79899 Other long term (current) drug therapy: Secondary | ICD-10-CM | POA: Diagnosis not present

## 2014-03-20 DIAGNOSIS — S0011XA Contusion of right eyelid and periocular area, initial encounter: Secondary | ICD-10-CM | POA: Diagnosis not present

## 2014-03-20 DIAGNOSIS — M25551 Pain in right hip: Secondary | ICD-10-CM | POA: Diagnosis not present

## 2014-03-20 DIAGNOSIS — S0990XA Unspecified injury of head, initial encounter: Secondary | ICD-10-CM | POA: Diagnosis not present

## 2014-03-20 DIAGNOSIS — Y998 Other external cause status: Secondary | ICD-10-CM | POA: Diagnosis not present

## 2014-03-20 DIAGNOSIS — Z86711 Personal history of pulmonary embolism: Secondary | ICD-10-CM | POA: Diagnosis not present

## 2014-03-20 DIAGNOSIS — S0033XA Contusion of nose, initial encounter: Secondary | ICD-10-CM | POA: Insufficient documentation

## 2014-03-20 DIAGNOSIS — Z8739 Personal history of other diseases of the musculoskeletal system and connective tissue: Secondary | ICD-10-CM | POA: Insufficient documentation

## 2014-03-20 DIAGNOSIS — D509 Iron deficiency anemia, unspecified: Secondary | ICD-10-CM | POA: Insufficient documentation

## 2014-03-20 DIAGNOSIS — Z85828 Personal history of other malignant neoplasm of skin: Secondary | ICD-10-CM | POA: Diagnosis not present

## 2014-03-20 DIAGNOSIS — Z86718 Personal history of other venous thrombosis and embolism: Secondary | ICD-10-CM | POA: Insufficient documentation

## 2014-03-20 DIAGNOSIS — R259 Unspecified abnormal involuntary movements: Secondary | ICD-10-CM | POA: Diagnosis not present

## 2014-03-20 DIAGNOSIS — Y92192 Bathroom in other specified residential institution as the place of occurrence of the external cause: Secondary | ICD-10-CM | POA: Diagnosis not present

## 2014-03-20 DIAGNOSIS — I10 Essential (primary) hypertension: Secondary | ICD-10-CM | POA: Diagnosis not present

## 2014-03-20 DIAGNOSIS — Z8619 Personal history of other infectious and parasitic diseases: Secondary | ICD-10-CM | POA: Insufficient documentation

## 2014-03-20 DIAGNOSIS — S32512A Fracture of superior rim of left pubis, initial encounter for closed fracture: Secondary | ICD-10-CM | POA: Diagnosis not present

## 2014-03-20 DIAGNOSIS — S32511A Fracture of superior rim of right pubis, initial encounter for closed fracture: Secondary | ICD-10-CM | POA: Diagnosis not present

## 2014-03-20 DIAGNOSIS — W1839XA Other fall on same level, initial encounter: Secondary | ICD-10-CM | POA: Diagnosis not present

## 2014-03-20 DIAGNOSIS — K219 Gastro-esophageal reflux disease without esophagitis: Secondary | ICD-10-CM | POA: Insufficient documentation

## 2014-03-20 DIAGNOSIS — S79911A Unspecified injury of right hip, initial encounter: Secondary | ICD-10-CM | POA: Diagnosis present

## 2014-03-20 DIAGNOSIS — W19XXXA Unspecified fall, initial encounter: Secondary | ICD-10-CM

## 2014-03-20 DIAGNOSIS — S32592A Other specified fracture of left pubis, initial encounter for closed fracture: Secondary | ICD-10-CM

## 2014-03-20 DIAGNOSIS — S199XXA Unspecified injury of neck, initial encounter: Secondary | ICD-10-CM | POA: Diagnosis not present

## 2014-03-20 LAB — COMPREHENSIVE METABOLIC PANEL
ALT: 30 U/L (ref 0–35)
AST: 47 U/L — ABNORMAL HIGH (ref 0–37)
Albumin: 3.9 g/dL (ref 3.5–5.2)
Alkaline Phosphatase: 130 U/L — ABNORMAL HIGH (ref 39–117)
Anion gap: 11 (ref 5–15)
BUN: 8 mg/dL (ref 6–23)
CO2: 27 mmol/L (ref 19–32)
Calcium: 9 mg/dL (ref 8.4–10.5)
Chloride: 100 mmol/L (ref 96–112)
Creatinine, Ser: 0.87 mg/dL (ref 0.50–1.10)
GFR calc Af Amer: 70 mL/min — ABNORMAL LOW (ref >90)
GFR calc non Af Amer: 61 mL/min — ABNORMAL LOW (ref >90)
Glucose, Bld: 98 mg/dL (ref 70–99)
Potassium: 4 mmol/L (ref 3.5–5.1)
Sodium: 138 mmol/L (ref 135–145)
Total Bilirubin: 0.8 mg/dL (ref 0.3–1.2)
Total Protein: 7.3 g/dL (ref 6.0–8.3)

## 2014-03-20 LAB — CBC WITH DIFFERENTIAL/PLATELET
Basophils Absolute: 0 10*3/uL (ref 0.0–0.1)
Basophils Relative: 0 % (ref 0–1)
Eosinophils Absolute: 0 10*3/uL (ref 0.0–0.7)
Eosinophils Relative: 0 % (ref 0–5)
HCT: 42.4 % (ref 36.0–46.0)
Hemoglobin: 14.5 g/dL (ref 12.0–15.0)
Lymphocytes Relative: 14 % (ref 12–46)
Lymphs Abs: 1.1 10*3/uL (ref 0.7–4.0)
MCH: 32.1 pg (ref 26.0–34.0)
MCHC: 34.2 g/dL (ref 30.0–36.0)
MCV: 93.8 fL (ref 78.0–100.0)
Monocytes Absolute: 0.7 10*3/uL (ref 0.1–1.0)
Monocytes Relative: 8 % (ref 3–12)
Neutro Abs: 6.3 10*3/uL (ref 1.7–7.7)
Neutrophils Relative %: 78 % — ABNORMAL HIGH (ref 43–77)
Platelets: 195 10*3/uL (ref 150–400)
RBC: 4.52 MIL/uL (ref 3.87–5.11)
RDW: 12.4 % (ref 11.5–15.5)
WBC: 8.1 10*3/uL (ref 4.0–10.5)

## 2014-03-20 LAB — PROTIME-INR
INR: 1.24 (ref 0.00–1.49)
Prothrombin Time: 15.7 seconds — ABNORMAL HIGH (ref 11.6–15.2)

## 2014-03-20 LAB — TYPE AND SCREEN
ABO/RH(D): A NEG
Antibody Screen: NEGATIVE

## 2014-03-20 MED ORDER — MORPHINE SULFATE 4 MG/ML IJ SOLN
2.0000 mg | Freq: Once | INTRAMUSCULAR | Status: AC
  Administered 2014-03-20: 2 mg via INTRAVENOUS
  Filled 2014-03-20: qty 1

## 2014-03-20 MED ORDER — OXYCODONE-ACETAMINOPHEN 5-325 MG PO TABS: 1.0000 | ORAL_TABLET | ORAL | Status: DC | PRN

## 2014-03-20 NOTE — Discharge Instructions (Signed)
Take the prescribed medication as directed.  Please use your walker for the next few weeks as pelvis heals. Follow-up with Dr. Wynelle Link-- call to schedule appt. Return to the ED for new or worsening symptoms.

## 2014-03-20 NOTE — ED Notes (Signed)
Pt ambulated well to restroom with RN assistance. PA witnessed pt ambulate and feels pt can return home and have some assistance from son and use a walker.

## 2014-03-20 NOTE — ED Provider Notes (Signed)
CSN: [id number]     Arrival date & time 03/20/14  1216 History   First MD Initiated Contact with Patient 03/20/14 1219     Chief Complaint  Patient presents with  . Fall  . Hip Pain     (Consider location/radiation/quality/duration/timing/severity/associated sxs/prior Treatment) The history is provided by the patient and medical records.    This is an 81 year old female with past medical history significant for hypertension, hyperlipidemia, DVT and PE currently on Eliquis, presenting to the ED following mechanical fall in her home last night.  Patient was walking out of her bathroom while watching the presidential debate, lost her footing and fell into her right hip and right arm.  She did hit her head but denies LOC.  States she has had continued pain, mostly in her right hip.  She has been able to stand but had been unable to ambulate.  Patient denies any headache, dizziness, lightheadedness, visual disturbance, numbness, paresthesias, or weakness of extremities.  Denies chest pain or SOB.  Orthopedist-- Dr. Wynelle Link, [facility] orthopedics.  VSS on arrival.  Past Medical History  Diagnosis Date  . S/P dilatation of esophageal stricture 2002  . Hypertension   . Hyperlipidemia   . DVT (deep venous thrombosis) 2009  . PE (pulmonary embolism) 2009  . GERD (gastroesophageal reflux disease)   . Diverticulosis   . Iron deficiency anemia   . Hyperplastic colon polyp   . Positive H. pylori test 01/20/96  . Degenerative joint disease   . Skin cancer     h/o basal and squamous skin cancer--> removed  . Persistent atrial fibrillation    Past Surgical History  Procedure Laterality Date  . Dilation and curettage of uterus      for endometrial polyps  . Replacement total knee      left  . Tonsillectomy    . Appendectomy    . Breast lumpectomy      benign   Family History  Problem Relation Age of Onset  . Esophageal cancer Brother   . Diverticulitis Brother   . Colon cancer Neg Hx    . Heart disease Mother   . Stroke Mother    History  Substance Use Topics  . Smoking status: Former Smoker -- 0.50 packs/day for 13 years    Types: Cigarettes    Start date: 02/12/1948    Quit date: 02/11/1961  . Smokeless tobacco: Never Used     Comment: Denies use of tobacco since 1963  . Alcohol Use: 0.0 oz/week    7-14 Glasses of wine per week     Comment: 1-2 glasses of wine per day   OB History    No data available     Review of Systems  Musculoskeletal: Positive for arthralgias.  All other systems reviewed and are negative.     Allergies  Iohexol; Ivp dye; Scallops; Sulfa antibiotics; and Sulfamethoxazole  Home Medications   Prior to Admission medications   Medication Sig Start Date End Date Taking? Authorizing Provider  apixaban (ELIQUIS) 5 MG TABS tablet Take 1 tablet (5 mg total) by mouth 2 (two) times daily. 02/22/13   Thompson Grayer, MD  atorvastatin (LIPITOR) 10 MG tablet Take 1 tablet (10 mg total) by mouth daily at 6 PM. 09/10/13   Zigmund Gottron, MD  benazepril (LOTENSIN) 40 MG tablet Take 1 tablet (40 mg total) by mouth daily. 05/13/13   Zigmund Gottron, MD  calcium carbonate (TUMS - DOSED IN MG ELEMENTAL CALCIUM) 500 MG chewable tablet  Chew 1 tablet by mouth 2 (two) times daily.      Historical Provider, MD  calcium-vitamin D (SM CALCIUM-VITAMIN D) 500-200 MG-UNIT per tablet Take 1 tablet by mouth 2 (two) times daily.     Historical Provider, MD  diltiazem (CARDIZEM CD) 180 MG 24 hr capsule Take 1 capsule (180 mg total) by mouth daily. 12/03/13   Zigmund Gottron, MD  ferrous sulfate 325 (65 FE) MG tablet Take 1 tablet (325 mg total) by mouth daily with breakfast. 06/04/13   Zigmund Gottron, MD  metoprolol (LOPRESSOR) 100 MG tablet TAKE ONE TABLET BY MOUTH TWICE DAILY 01/04/14   Zigmund Gottron, MD  omeprazole (PRILOSEC) 40 MG capsule Take 1 capsule (40 mg total) by mouth daily. 05/12/13 05/12/14  Zigmund Gottron, MD  traMADol  (ULTRAM) 50 MG tablet TAKE ONE TABLET BY MOUTH EVERY 6 HOURS AS NEEDED FOR PAIN 01/31/14   Zigmund Gottron, MD   BP 172/97 mmHg  Pulse 66  Temp(Src) 97.7 F (36.5 C) (Oral)  Resp 18  SpO2 98%   Physical Exam  Constitutional: She is oriented to person, place, and time. She appears well-developed and well-nourished.  HENT:  Head: Normocephalic and atraumatic.  Mouth/Throat: Oropharynx is clear and moist.  Hematoma noted above right eyebrow with localized bruising and tenderness to palpation; bridge of nose is also tender with evidence of dried blood in right nare, no active bleeding; midface stable; dentition intact  Eyes: Conjunctivae, EOM and lids are normal. Pupils are equal, round, and reactive to light.  Neck: Normal range of motion.  Cardiovascular: Normal rate, regular rhythm and normal heart sounds.   Pulmonary/Chest: Effort normal and breath sounds normal.  Abdominal: Soft. Bowel sounds are normal.  Musculoskeletal:       Right hip: She exhibits decreased range of motion, decreased strength and tenderness.       Right upper arm: She exhibits tenderness.  Right hip tender to palpation, worse along lateral aspect; no gross deformity, leg mildly shortened when compared with left; limited range of motion due to pain; DP pulse and sensation intact Right humerus tender in mid shaft region, no gross deformities, full range of motion of shoulder and elbow; strong radial pulse, sensation intact  Neurological: She is alert and oriented to person, place, and time.  AAOx3, answering questions appropriately; equal strength UE and LE bilaterally; CN grossly intact; moves all extremities appropriately without ataxia; no focal neuro deficits or facial asymmetry appreciated  Skin: Skin is warm and dry.  Psychiatric: She has a normal mood and affect.  Nursing note and vitals reviewed.   ED Course  Procedures (including critical care time) Labs Review Labs Reviewed  CBC WITH  DIFFERENTIAL/PLATELET - Abnormal; Notable for the following:    Neutrophils Relative % 78 (*)    All other components within normal limits  COMPREHENSIVE METABOLIC PANEL - Abnormal; Notable for the following:    AST 47 (*)    Alkaline Phosphatase 130 (*)    GFR calc non Af Amer 61 (*)    GFR calc Af Amer 70 (*)    All other components within normal limits  PROTIME-INR - Abnormal; Notable for the following:    Prothrombin Time 15.7 (*)    All other components within normal limits  TYPE AND SCREEN    Imaging Review Dg Chest 1 View  03/20/2014   CLINICAL DATA:  Patient tripped and fell over a rug while coming out of her bathroom at home earlier  today, injuring the right hip. Preoperative respiratory evaluation if surgery is needed.  EXAM: CHEST  1 VIEW  COMPARISON:  08/05/2013 dating back to 01/14/2008.  FINDINGS: AP semi-erect image was obtained. Cardiac silhouette mildly enlarged, unchanged. Moderate to large hiatal hernia, unchanged. Hilar and mediastinal contours otherwise unremarkable. Stable chronic elevation of the right hemidiaphragm and associated chronic atelectasis and/or scar at the right lung base. Lungs otherwise clear. No localized airspace consolidation. No pleural effusions. No pneumothorax. Normal pulmonary vascularity.  IMPRESSION: No acute cardiopulmonary disease.   Electronically Signed   By: Evangeline Dakin M.D.   On: 03/20/2014 13:21   Ct Head Wo Contrast  03/20/2014   CLINICAL DATA:  Post fall last night, now with bruising and swelling about the right eye.  EXAM: CT HEAD WITHOUT CONTRAST  CT MAXILLOFACIAL WITHOUT CONTRAST  CT CERVICAL SPINE WITHOUT CONTRAST  TECHNIQUE: Multidetector CT imaging of the head, cervical spine, and maxillofacial structures were performed using the standard protocol without intravenous contrast. Multiplanar CT image reconstructions of the cervical spine and maxillofacial structures were also generated.  COMPARISON:  None.  FINDINGS: CT HEAD FINDINGS   Regional soft tissues appear normal with special attention paid to the right orbit.  Advanced volume loss with diffuse sulcal prominence. Scattered periventricular hypodensities compatible with microvascular ischemic disease. Dystrophic calcifications within the left basal ganglia. No CT evidence of acute large territory infarct. No intraparenchymal or extra-axial mass or hemorrhage. Normal size and configuration of the ventricles and basilar cisterns. No midline shift. Intracranial atherosclerosis.  Limited visualization the paranasal sinuses and mastoid air cells is normal. No air-fluid levels.  CT MAXILLOFACIAL FINDINGS  No displaced facial fracture with special attention paid to the right orbit.  Normal noncontrast appearance of the bilateral orbits and globes.  Normal appearance of the bilateral pterygoid plates.  Normal appearance of the mandible. Asymmetric degenerative change of the left TMJ. Both mandibular condyles are normally located.  There is mild rightward nasal septal deviation. The remaining paranasal sinuses and mastoid air cells are normally aerated. Incidental note is made of a torus palatini.  Normal appearance of the bilateral zygomatic arches.  CT CERVICAL SPINE FINDINGS  C1 to the superior endplate of T2 is imaged.  Normal alignment of the cervical spine. No anterolisthesis or retrolisthesis. The bilateral facets are normally aligned. The dens is normally positioned between the lateral masses of C1. Normal atlantodental and atlantoaxial articulations.  No fracture or static subluxation of the cervical spine. Cervical vertebral body heights are preserved.  Prevertebral soft tissues are normal.  Mild DDD of T1-T2 with small posteriorly directed disc osteophyte complex at this location. Remaining intervertebral disc space heights appear preserved.  Regional soft tissues appear normal. No bulky cervical lymphadenopathy on this noncontrast examination. Note is made of a dystrophic approximately  0.7 cm) Calcification within the right lobe of the thyroid as well as a ill-defined hypo attenuating punctate (approximately 0.7 cm) nodule within the left lobe of the thyroid (image 68, series 13). Limited visualization of the lung apices is normal.  IMPRESSION: 1. Advanced atrophy and microvascular ischemic disease without acute intracranial process. 2. No displaced facial fracture with special attention paid to the right orbit and globe. 3. Asymmetric degenerative change of the left TMJ. 4. No fracture or static subluxation of the cervical spine. 5. There are mild DDD of T1-T2.   Electronically Signed   By: Sandi Mariscal M.D.   On: 03/20/2014 13:49   Ct Cervical Spine Wo Contrast  03/20/2014  CLINICAL DATA:  Post fall last night, now with bruising and swelling about the right eye.  EXAM: CT HEAD WITHOUT CONTRAST  CT MAXILLOFACIAL WITHOUT CONTRAST  CT CERVICAL SPINE WITHOUT CONTRAST  TECHNIQUE: Multidetector CT imaging of the head, cervical spine, and maxillofacial structures were performed using the standard protocol without intravenous contrast. Multiplanar CT image reconstructions of the cervical spine and maxillofacial structures were also generated.  COMPARISON:  None.  FINDINGS: CT HEAD FINDINGS  Regional soft tissues appear normal with special attention paid to the right orbit.  Advanced volume loss with diffuse sulcal prominence. Scattered periventricular hypodensities compatible with microvascular ischemic disease. Dystrophic calcifications within the left basal ganglia. No CT evidence of acute large territory infarct. No intraparenchymal or extra-axial mass or hemorrhage. Normal size and configuration of the ventricles and basilar cisterns. No midline shift. Intracranial atherosclerosis.  Limited visualization the paranasal sinuses and mastoid air cells is normal. No air-fluid levels.  CT MAXILLOFACIAL FINDINGS  No displaced facial fracture with special attention paid to the right orbit.  Normal  noncontrast appearance of the bilateral orbits and globes.  Normal appearance of the bilateral pterygoid plates.  Normal appearance of the mandible. Asymmetric degenerative change of the left TMJ. Both mandibular condyles are normally located.  There is mild rightward nasal septal deviation. The remaining paranasal sinuses and mastoid air cells are normally aerated. Incidental note is made of a torus palatini.  Normal appearance of the bilateral zygomatic arches.  CT CERVICAL SPINE FINDINGS  C1 to the superior endplate of T2 is imaged.  Normal alignment of the cervical spine. No anterolisthesis or retrolisthesis. The bilateral facets are normally aligned. The dens is normally positioned between the lateral masses of C1. Normal atlantodental and atlantoaxial articulations.  No fracture or static subluxation of the cervical spine. Cervical vertebral body heights are preserved.  Prevertebral soft tissues are normal.  Mild DDD of T1-T2 with small posteriorly directed disc osteophyte complex at this location. Remaining intervertebral disc space heights appear preserved.  Regional soft tissues appear normal. No bulky cervical lymphadenopathy on this noncontrast examination. Note is made of a dystrophic approximately 0.7 cm) Calcification within the right lobe of the thyroid as well as a ill-defined hypo attenuating punctate (approximately 0.7 cm) nodule within the left lobe of the thyroid (image 68, series 13). Limited visualization of the lung apices is normal.  IMPRESSION: 1. Advanced atrophy and microvascular ischemic disease without acute intracranial process. 2. No displaced facial fracture with special attention paid to the right orbit and globe. 3. Asymmetric degenerative change of the left TMJ. 4. No fracture or static subluxation of the cervical spine. 5. There are mild DDD of T1-T2.   Electronically Signed   By: Sandi Mariscal M.D.   On: 03/20/2014 13:49   Dg Hip Unilat With Pelvis 2-3 Views Right  03/20/2014    CLINICAL DATA:  Patient tripped and fell over a rug while walking out of her bathroom at home earlier today, pain involving the right hip. Initial encounter.  EXAM: RIGHT HIP (WITH PELVIS) 2-3 VIEWS  COMPARISON:  None.  FINDINGS: Comminuted fractures involving the right superior and inferior pubic rami at the symphysis, such that the right symphysis is a free fragment, still anatomically aligned with the left pubic symphysis. No fractures involving the proximal right femur. Well preserved joint space in the right hip for age.  No fractures elsewhere involving the pelvis. Contralateral left hip joint with well-preserved joint space. Sacroiliac joints intact. Degenerative changes involving the visualized lower  lumbar spine.  IMPRESSION: 1. Comminuted traumatic fractures involving the right superior and inferior pubic rami at the symphysis, such that the right symphysis is a free fragment. This fragment remains anatomically aligned with the left symphysis, and there is no evidence of diastasis. 2. Normal-appearing right hip.   Electronically Signed   By: Evangeline Dakin M.D.   On: 03/20/2014 13:18   Ct Maxillofacial Wo Cm  03/20/2014   CLINICAL DATA:  Post fall last night, now with bruising and swelling about the right eye.  EXAM: CT HEAD WITHOUT CONTRAST  CT MAXILLOFACIAL WITHOUT CONTRAST  CT CERVICAL SPINE WITHOUT CONTRAST  TECHNIQUE: Multidetector CT imaging of the head, cervical spine, and maxillofacial structures were performed using the standard protocol without intravenous contrast. Multiplanar CT image reconstructions of the cervical spine and maxillofacial structures were also generated.  COMPARISON:  None.  FINDINGS: CT HEAD FINDINGS  Regional soft tissues appear normal with special attention paid to the right orbit.  Advanced volume loss with diffuse sulcal prominence. Scattered periventricular hypodensities compatible with microvascular ischemic disease. Dystrophic calcifications within the left basal  ganglia. No CT evidence of acute large territory infarct. No intraparenchymal or extra-axial mass or hemorrhage. Normal size and configuration of the ventricles and basilar cisterns. No midline shift. Intracranial atherosclerosis.  Limited visualization the paranasal sinuses and mastoid air cells is normal. No air-fluid levels.  CT MAXILLOFACIAL FINDINGS  No displaced facial fracture with special attention paid to the right orbit.  Normal noncontrast appearance of the bilateral orbits and globes.  Normal appearance of the bilateral pterygoid plates.  Normal appearance of the mandible. Asymmetric degenerative change of the left TMJ. Both mandibular condyles are normally located.  There is mild rightward nasal septal deviation. The remaining paranasal sinuses and mastoid air cells are normally aerated. Incidental note is made of a torus palatini.  Normal appearance of the bilateral zygomatic arches.  CT CERVICAL SPINE FINDINGS  C1 to the superior endplate of T2 is imaged.  Normal alignment of the cervical spine. No anterolisthesis or retrolisthesis. The bilateral facets are normally aligned. The dens is normally positioned between the lateral masses of C1. Normal atlantodental and atlantoaxial articulations.  No fracture or static subluxation of the cervical spine. Cervical vertebral body heights are preserved.  Prevertebral soft tissues are normal.  Mild DDD of T1-T2 with small posteriorly directed disc osteophyte complex at this location. Remaining intervertebral disc space heights appear preserved.  Regional soft tissues appear normal. No bulky cervical lymphadenopathy on this noncontrast examination. Note is made of a dystrophic approximately 0.7 cm) Calcification within the right lobe of the thyroid as well as a ill-defined hypo attenuating punctate (approximately 0.7 cm) nodule within the left lobe of the thyroid (image 68, series 13). Limited visualization of the lung apices is normal.  IMPRESSION: 1. Advanced  atrophy and microvascular ischemic disease without acute intracranial process. 2. No displaced facial fracture with special attention paid to the right orbit and globe. 3. Asymmetric degenerative change of the left TMJ. 4. No fracture or static subluxation of the cervical spine. 5. There are mild DDD of T1-T2.   Electronically Signed   By: Sandi Mariscal M.D.   On: 03/20/2014 13:49     EKG Interpretation None      MDM   Final diagnoses:  Fall  Bilateral pubic rami fractures, closed, initial encounter   81 year old female with mechanical fall last night.  There was head injury but no loss of consciousness. Patient is on daily Eliquis due  to hx of DVT/PE.  Neurologic exam non-focal.  On exam, she does have tenderness of right hip with mild leg shortening. Chest is a hematoma above her right eye and tenderness of her right humerus. Imaging was obtained, remarkable for right superior and inferior pubic rami fractures.  Case discussed with orthopedics, Dr. Tonita Cong-- recommends weight bearing as tolerated, FU in office.  If patient unable to ambulate, will need medical admission.  Patient given dose of morphine and was able to ambulate in the ED.  States minimal pain, mostly soreness.  Patient will be d/c home with pain meds.  She will use her walker to help for the next few weeks, she is planning to stay with her son for a few days.  Patient to FU with her orthopedist, Dr. Wynelle Link.  Discussed plan with patient, he/she acknowledged understanding and agreed with plan of care.  Return precautions given for new or worsening symptoms.  Case discussed with attending physician, Dr. Aline Brochure, who evaluated patient and agrees with assessment and plan of care.  Larene Pickett, PA-C 03/20/14 [place], MD 03/21/14 [phone]

## 2014-03-20 NOTE — ED Notes (Signed)
Patient transported to X-ray 

## 2014-03-20 NOTE — ED Notes (Signed)
Pt here from home with c/o right hip pain after a fall last night ,pt does have some slight shortening to the right leg and some slight bruising to the right eye and some slight swelling above same eye , pt has pain to the right hip on palpation

## 2014-03-29 DIAGNOSIS — S32511A Fracture of superior rim of right pubis, initial encounter for closed fracture: Secondary | ICD-10-CM | POA: Diagnosis not present

## 2014-03-29 DIAGNOSIS — S32591A Other specified fracture of right pubis, initial encounter for closed fracture: Secondary | ICD-10-CM | POA: Diagnosis not present

## 2014-04-19 ENCOUNTER — Other Ambulatory Visit: Payer: Self-pay

## 2014-04-19 DIAGNOSIS — Z1231 Encounter for screening mammogram for malignant neoplasm of breast: Secondary | ICD-10-CM

## 2014-04-19 DIAGNOSIS — Z9889 Other specified postprocedural states: Secondary | ICD-10-CM

## 2014-04-25 DIAGNOSIS — S32591D Other specified fracture of right pubis, subsequent encounter for fracture with routine healing: Secondary | ICD-10-CM | POA: Diagnosis not present

## 2014-04-25 DIAGNOSIS — S32511D Fracture of superior rim of right pubis, subsequent encounter for fracture with routine healing: Secondary | ICD-10-CM | POA: Diagnosis not present

## 2014-05-10 ENCOUNTER — Ambulatory Visit
Admission: RE | Admit: 2014-05-10 | Discharge: 2014-05-10 | Disposition: A | Discharge status: Home / Self Care | Payer: Medicare Other | Source: Ambulatory Visit

## 2014-05-10 DIAGNOSIS — Z9889 Other specified postprocedural states: Secondary | ICD-10-CM

## 2014-05-10 DIAGNOSIS — Z1231 Encounter for screening mammogram for malignant neoplasm of breast: Secondary | ICD-10-CM | POA: Diagnosis not present

## 2014-05-13 ENCOUNTER — Encounter: Payer: Self-pay | Admitting: Family Medicine

## 2014-05-13 ENCOUNTER — Ambulatory Visit (INDEPENDENT_AMBULATORY_CARE_PROVIDER_SITE_OTHER): Payer: Medicare Other | Admitting: Family Medicine

## 2014-05-13 VITALS — BP 141/76 | HR 58 | Temp 98.1°F | Ht 62.0 in | Wt 155.5 lb

## 2014-05-13 DIAGNOSIS — M810 Age-related osteoporosis without current pathological fracture: Secondary | ICD-10-CM | POA: Insufficient documentation

## 2014-05-13 DIAGNOSIS — M899 Disorder of bone, unspecified: Secondary | ICD-10-CM

## 2014-05-13 DIAGNOSIS — K219 Gastro-esophageal reflux disease without esophagitis: Secondary | ICD-10-CM | POA: Diagnosis not present

## 2014-05-13 DIAGNOSIS — D500 Iron deficiency anemia secondary to blood loss (chronic): Secondary | ICD-10-CM

## 2014-05-13 DIAGNOSIS — N838 Other noninflammatory disorders of ovary, fallopian tube and broad ligament: Secondary | ICD-10-CM

## 2014-05-13 DIAGNOSIS — I10 Essential (primary) hypertension: Secondary | ICD-10-CM | POA: Diagnosis not present

## 2014-05-13 DIAGNOSIS — R1909 Other intra-abdominal and pelvic swelling, mass and lump: Secondary | ICD-10-CM

## 2014-05-13 DIAGNOSIS — M8580 Other specified disorders of bone density and structure, unspecified site: Secondary | ICD-10-CM

## 2014-05-13 NOTE — Assessment & Plan Note (Addendum)
Recent traumatic fracture.  Due for bone density to insure hasn't progressed to osteoporosis.  Ordered a bone density scan and based on the results, she will begin a bisphosphonate. We discussed Fosamax vs. IV Boneva, noting concerns about the Fosamax due to patient history of GERD and esophageal stricture (resolved 1992). Advised patient to consider both options, and we will follow-up based on results from the bone density scan.  Written by: Dois Davenport, MS3  Agree

## 2014-05-13 NOTE — Assessment & Plan Note (Signed)
Currently doing fine.  Will be an issue if bisphosphanate.

## 2014-05-13 NOTE — Progress Notes (Signed)
Subjective:     Patient ID: [name], female   DOB: 1931-02-02, 81 y.o.   MRN: [id number]  Written by: Dois Davenport, MS3  HPI Mrs. Mcquitty is a 81 year old female with a past medical history of osteopenia who presents today for a health maintenance exam.  Osteopenia: Mrs. Axton fell and broke her pelvis on 03/20/2014. Orthopedics did not recommend surgery and she was discharged on oxycodone for pain, which she used once daily for two weeks. She also used a walker for support for 2 weeks. She stayed with her sons for 3 weeks during recovery, but has since returned to her house and is having no difficulties. Today she feels no discomfort and is walking well without assistance.   Anemia: Hemoglobin was 14.5 on 03/20/2014 and patient asked whether it would be advisable to stop iron supplement or decrease frequency due to concern over identifying tarry stools since she is on Eliquis. She frequently reports black stools.   Otherwise, she has no complaints today. She notes pain in both ankles for which she takes Tylenol. The pain does not restrict activity.  Review of Systems No chest pain, shortness of breath, GI complaints.     Objective:   Physical Exam  General: well-appearing female in NAD CV: RRR, no MRG Pulm: CTA bilaterally MSK: ambulating well without assistance    Assessment:     Please see Problem List.     Plan:     Please see Problem List.

## 2014-05-13 NOTE — Assessment & Plan Note (Addendum)
Hemoglobin 14.5 on 03/20/2014. Advised patient to continue taking iron supplement, and if she does choose to take it less frequently, return for follow-up CBC. Discussed risk for anemia vs. ability to identify tarry stools since she is on Eliquis.   Written by: Dois Davenport, MS3 Chronic slow GI loss.  Up to date on colon ca screen.  On anticoag.  She want to try lower dose iron.

## 2014-05-13 NOTE — Assessment & Plan Note (Signed)
Well controled

## 2014-05-13 NOTE — Assessment & Plan Note (Signed)
Recheck Ca 125

## 2014-05-13 NOTE — Progress Notes (Signed)
Patient ID: Sara Russell, female   DOB: 1930/06/09, 81 y.o.   MRN: [id number] I repeated the history performed by med student Mounsey.  I also did my own, independent PE.  Agree with her documentation: Briefly, FU of multiple problems 1. Known osteopenia and recent traumatic fx.  It has been 3 years since last bone density.  2. History of GERD and esophageal stricture last dilated in 1992.  Asymptomatic.  This will be important if we consider bisphosphanates.   3. A fib and hx of DVT/PE on eliquis.  She has Medicare Part D but does end up in the donut hole periodically.  Does not want to change to coumadin.  A fib is rate controled on diltiazem.   4.  Up to date on mammo. 5. Known adnexal mass.  Will follow with Ca 125.  Likely benign.  Asymptomatic.

## 2014-05-13 NOTE — Patient Instructions (Signed)
Great to see you today.  1. Bone density scan: Dr. Andria Frames will call with results - if results show osteoporosis, consider whether you want the oral pill (Fosamax) or the IV injection once per 3 months (Boneva)  2. CA-125 ordered today: Dr. Andria Frames will call with results   3. Iron pill: if you start taking is less frequently, follow up for labs to check blood counts

## 2014-05-14 LAB — CA 125: CA 125: 11 U/mL (ref <35)

## 2014-05-16 ENCOUNTER — Ambulatory Visit (HOSPITAL_COMMUNITY)
Admission: RE | Admit: 2014-05-16 | Discharge: 2014-05-16 | Disposition: A | Discharge status: Home / Self Care | Payer: Medicare Other | Source: Ambulatory Visit | Attending: Nurse Practitioner | Admitting: Nurse Practitioner

## 2014-05-16 ENCOUNTER — Encounter (HOSPITAL_COMMUNITY): Payer: Self-pay | Admitting: Nurse Practitioner

## 2014-05-16 VITALS — BP 138/74 | HR 75 | Ht 62.0 in | Wt 154.8 lb

## 2014-05-16 DIAGNOSIS — Z79899 Other long term (current) drug therapy: Secondary | ICD-10-CM | POA: Insufficient documentation

## 2014-05-16 DIAGNOSIS — I10 Essential (primary) hypertension: Secondary | ICD-10-CM | POA: Diagnosis not present

## 2014-05-16 DIAGNOSIS — I4819 Other persistent atrial fibrillation: Secondary | ICD-10-CM

## 2014-05-16 DIAGNOSIS — Z87891 Personal history of nicotine dependence: Secondary | ICD-10-CM | POA: Diagnosis not present

## 2014-05-16 DIAGNOSIS — Z882 Allergy status to sulfonamides status: Secondary | ICD-10-CM | POA: Insufficient documentation

## 2014-05-16 DIAGNOSIS — Z7902 Long term (current) use of antithrombotics/antiplatelets: Secondary | ICD-10-CM | POA: Diagnosis not present

## 2014-05-16 DIAGNOSIS — I482 Chronic atrial fibrillation, unspecified: Secondary | ICD-10-CM

## 2014-05-16 DIAGNOSIS — I481 Persistent atrial fibrillation: Secondary | ICD-10-CM | POA: Diagnosis not present

## 2014-05-16 NOTE — Patient Instructions (Signed)
Your physician wants you to follow-up in: 6 months with Dr. Vallery Ridge will receive a reminder letter in the mail two months in advance. If you don't receive a letter, please call our office to schedule the follow-up appointment.

## 2014-05-16 NOTE — Progress Notes (Signed)
Patient ID: Sara Russell, female   DOB: 11-02-1930, 81 y.o.   MRN: [id number]   PCP:  Zigmund Gottron, MD  The patient presents today for routine electrophysiology followup.  Since last being seen in our clinic, the patient reports doing well, except for a fall in February at which time she tripped over a door jam and broke a bone in her pelvis. This was am isolated fall. Doing well on blood thinners, no unusual bleeding.  She remains active for her age.  In June, had a nuclear scan which showed normal EF and no ischemia. She is unaware of her afib and overall feels well. Labs, cbc amd metC checked 2/16, in hospital ER, with fall and OK. Mildly elevated AST/Alk Phos  Today, she denies symptoms of palpitations, chest pain, shortness of breath, orthopnea, PND,  Positive for some lower extremity edema, no dizziness, presyncope, syncope, or neurologic sequela.  The patient feels that she is tolerating medications without difficulties and is otherwise without complaint today.   Past Medical History  Diagnosis Date  . S/P dilatation of esophageal stricture 2002  . Hypertension   . Hyperlipidemia   . DVT (deep venous thrombosis) 2009  . PE (pulmonary embolism) 2009  . GERD (gastroesophageal reflux disease)   . Diverticulosis   . Iron deficiency anemia   . Hyperplastic colon polyp   . Positive H. pylori test 01/20/96  . Degenerative joint disease   . Skin cancer     h/o basal and squamous skin cancer--> removed  . Persistent atrial fibrillation    Past Surgical History  Procedure Laterality Date  . Dilation and curettage of uterus      for endometrial polyps  . Replacement total knee      left  . Tonsillectomy    . Appendectomy    . Breast lumpectomy      benign    Current Outpatient Prescriptions  Medication Sig Dispense Refill  . acetaminophen (TYLENOL) 650 MG CR tablet Take 650 mg by mouth every 8 (eight) hours as needed for pain.    Marland Kitchen apixaban (ELIQUIS) 5 MG TABS  tablet Take 1 tablet (5 mg total) by mouth 2 (two) times daily. 60 tablet 6  . atorvastatin (LIPITOR) 10 MG tablet Take 1 tablet (10 mg total) by mouth daily at 6 PM. 90 tablet 3  . benazepril (LOTENSIN) 40 MG tablet Take 1 tablet (40 mg total) by mouth daily. 90 tablet 3  . calcium carbonate (TUMS - DOSED IN MG ELEMENTAL CALCIUM) 500 MG chewable tablet Chew 1 tablet by mouth 2 (two) times daily.      . calcium-vitamin D (SM CALCIUM-VITAMIN D) 500-200 MG-UNIT per tablet Take 1 tablet by mouth 2 (two) times daily.     Marland Kitchen diltiazem (CARDIZEM CD) 180 MG 24 hr capsule Take 1 capsule (180 mg total) by mouth daily. 90 capsule 3  . ferrous sulfate 325 (65 FE) MG tablet Take 1 tablet (325 mg total) by mouth daily with breakfast. 1 tablet 0  . metoprolol (LOPRESSOR) 100 MG tablet TAKE ONE TABLET BY MOUTH TWICE DAILY 180 tablet 3  . omeprazole (PRILOSEC) 40 MG capsule Take 1 capsule (40 mg total) by mouth daily. 90 capsule 3  . traMADol (ULTRAM) 50 MG tablet TAKE ONE TABLET BY MOUTH EVERY 6 HOURS AS NEEDED FOR PAIN 180 tablet 2  . [DISCONTINUED] omeprazole (PRILOSEC OTC) 20 MG tablet Take 20 mg by mouth 2 (two) times daily.  No current facility-administered medications for this encounter.    Allergies  Allergen Reactions  . Iohexol Other (See Comments)    unknown  . Ivp Dye [Iodinated Diagnostic Agents] Hives  . Scallops [Shellfish Allergy] Other (See Comments)    Projectile vomiting.  . Sulfa Antibiotics Hives  . Sulfamethoxazole Hives    REACTION: unspecified    History   Social History  . Marital Status: Widowed    Spouse Name: N/A  . Number of Children: 3  . Years of Education: college   Occupational History  . retired Marine scientist    Social History Main Topics  . Smoking status: Former Smoker -- 0.50 packs/day for 13 years    Types: Cigarettes    Start date: 02/12/1948    Quit date: 02/11/1961  . Smokeless tobacco: Never Used     Comment: Denies use of tobacco since 1963  .  Alcohol Use: 0.0 oz/week    7-14 Glasses of wine per week     Comment: 1-2 glasses of wine per day  . Drug Use: No  . Sexual Activity: Not Currently   Other Topics Concern  . Not on file   Social History Narrative   Health Care POA:    Emergency Contact: son, Darcus Edds (c) [phone] [name])   End of Life Plan:    Who lives with you: self   Any pets: none   Diet: Pt has a varied diet of protein, starch, vegetables.   Exercise: Pt has no regular exercise plan because of pain in back.   Seatbelts: Pt reports wearing seatbelt when in vehicle.   Nancy Fetter Exposure/Protection: Pt reports wearing sun screen.   Hobbies: reading, traveling, eating out.      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 Esophageal cancer Brother   . Diverticulitis Brother   . Colon cancer Neg Hx   . Heart disease Mother   . Stroke Mother     ROS-  All systems are reviewed and are negative except as outlined in the HPI above  Physical Exam: Filed Vitals:   05/16/14 0938  BP: 138/74  Pulse: 75  Height: 5' 2"  (1.575 m)  Weight: 154 lb 12.8 oz (70.217 kg)    GEN- The patient is well appearing, alert and oriented x 3 today.   Head- normocephalic, atraumatic Eyes-  Sclera clear, conjunctiva pink Ears- hearing intact Oropharynx- clear Neck- supple, no JVP Lymph- no cervical lymphadenopathy Lungs- Clear to ausculation bilaterally, normal work of breathing Heart- irregular rate and rhythm, no murmurs, rubs or gallops, PMI not laterally displaced GI- soft, NT, ND, + BS Extremities- no clubbing, cyanosis, mild chronic edema of left ankle. Neuro- strength and sensation are intact  ekg today reveas afib  Rate controlled 75 bpm, QTc 462 ms..  Assessment and Plan:  1. Permanent afib V rates are stable Continue eliquis.  Creatinine is  stable.  2. HTN Stable   No change required today  Recheck Dr. [name] in 6 months.

## 2014-05-19 ENCOUNTER — Ambulatory Visit
Admission: RE | Admit: 2014-05-19 | Discharge: 2014-05-19 | Disposition: A | Discharge status: Home / Self Care | Payer: Medicare Other | Source: Ambulatory Visit | Attending: Family Medicine | Admitting: Family Medicine

## 2014-05-19 DIAGNOSIS — M8580 Other specified disorders of bone density and structure, unspecified site: Secondary | ICD-10-CM

## 2014-05-19 DIAGNOSIS — M85831 Other specified disorders of bone density and structure, right forearm: Secondary | ICD-10-CM | POA: Diagnosis not present

## 2014-05-19 DIAGNOSIS — Z780 Asymptomatic menopausal state: Secondary | ICD-10-CM | POA: Diagnosis not present

## 2014-05-19 DIAGNOSIS — M8588 Other specified disorders of bone density and structure, other site: Secondary | ICD-10-CM | POA: Diagnosis not present

## 2014-06-14 ENCOUNTER — Other Ambulatory Visit: Payer: Self-pay | Admitting: Family Medicine

## 2014-06-23 ENCOUNTER — Other Ambulatory Visit: Payer: Self-pay | Admitting: Family Medicine

## 2014-07-21 ENCOUNTER — Other Ambulatory Visit: Payer: Self-pay | Admitting: Internal Medicine

## 2014-08-29 ENCOUNTER — Other Ambulatory Visit: Payer: Self-pay | Admitting: Family Medicine

## 2014-08-29 ENCOUNTER — Telehealth: Payer: Self-pay | Admitting: Family Medicine

## 2014-08-29 ENCOUNTER — Encounter: Payer: Self-pay | Admitting: Family Medicine

## 2014-08-29 NOTE — Telephone Encounter (Signed)
Pt called and would a few minutes of Dr. Lowella Bandy time go over something. She didn't want to tell me. jw

## 2014-08-29 NOTE — Progress Notes (Signed)
Patient ID: Sara Russell, female   DOB: 01-Feb-1931, 81 y.o.   MRN: [id number] Printed letter with problem and med list for [facility].

## 2014-08-29 NOTE — Telephone Encounter (Signed)
Let me know that she moving to [place], independent living.

## 2014-09-15 ENCOUNTER — Inpatient Hospital Stay (HOSPITAL_COMMUNITY)
Admission: EM | Admit: 2014-09-15 | Discharge: 2014-09-27 | DRG: 871 | Disposition: A | Discharge status: Rehabilitation Facility | Payer: Medicare Other | Attending: Family Medicine | Admitting: Family Medicine

## 2014-09-15 ENCOUNTER — Encounter (HOSPITAL_COMMUNITY): Payer: Self-pay | Admitting: *Deleted

## 2014-09-15 ENCOUNTER — Emergency Department (HOSPITAL_COMMUNITY): Payer: Medicare Other

## 2014-09-15 ENCOUNTER — Other Ambulatory Visit: Payer: Self-pay

## 2014-09-15 DIAGNOSIS — N179 Acute kidney failure, unspecified: Secondary | ICD-10-CM | POA: Diagnosis not present

## 2014-09-15 DIAGNOSIS — I4891 Unspecified atrial fibrillation: Secondary | ICD-10-CM | POA: Diagnosis present

## 2014-09-15 DIAGNOSIS — E872 Acidosis: Secondary | ICD-10-CM | POA: Diagnosis not present

## 2014-09-15 DIAGNOSIS — Z87891 Personal history of nicotine dependence: Secondary | ICD-10-CM

## 2014-09-15 DIAGNOSIS — I5032 Chronic diastolic (congestive) heart failure: Secondary | ICD-10-CM | POA: Diagnosis not present

## 2014-09-15 DIAGNOSIS — Z91041 Radiographic dye allergy status: Secondary | ICD-10-CM | POA: Diagnosis not present

## 2014-09-15 DIAGNOSIS — N3000 Acute cystitis without hematuria: Secondary | ICD-10-CM | POA: Diagnosis not present

## 2014-09-15 DIAGNOSIS — Z96652 Presence of left artificial knee joint: Secondary | ICD-10-CM | POA: Diagnosis present

## 2014-09-15 DIAGNOSIS — N3001 Acute cystitis with hematuria: Secondary | ICD-10-CM | POA: Diagnosis not present

## 2014-09-15 DIAGNOSIS — Z79899 Other long term (current) drug therapy: Secondary | ICD-10-CM

## 2014-09-15 DIAGNOSIS — Z888 Allergy status to other drugs, medicaments and biological substances status: Secondary | ICD-10-CM

## 2014-09-15 DIAGNOSIS — R778 Other specified abnormalities of plasma proteins: Secondary | ICD-10-CM | POA: Insufficient documentation

## 2014-09-15 DIAGNOSIS — E875 Hyperkalemia: Secondary | ICD-10-CM | POA: Diagnosis not present

## 2014-09-15 DIAGNOSIS — R7989 Other specified abnormal findings of blood chemistry: Secondary | ICD-10-CM | POA: Diagnosis not present

## 2014-09-15 DIAGNOSIS — J9601 Acute respiratory failure with hypoxia: Secondary | ICD-10-CM | POA: Diagnosis present

## 2014-09-15 DIAGNOSIS — A419 Sepsis, unspecified organism: Principal | ICD-10-CM | POA: Diagnosis present

## 2014-09-15 DIAGNOSIS — I482 Chronic atrial fibrillation: Secondary | ICD-10-CM | POA: Diagnosis not present

## 2014-09-15 DIAGNOSIS — R6521 Severe sepsis with septic shock: Secondary | ICD-10-CM | POA: Diagnosis present

## 2014-09-15 DIAGNOSIS — N170 Acute kidney failure with tubular necrosis: Secondary | ICD-10-CM | POA: Diagnosis present

## 2014-09-15 DIAGNOSIS — D696 Thrombocytopenia, unspecified: Secondary | ICD-10-CM | POA: Diagnosis present

## 2014-09-15 DIAGNOSIS — N390 Urinary tract infection, site not specified: Secondary | ICD-10-CM | POA: Diagnosis present

## 2014-09-15 DIAGNOSIS — R5381 Other malaise: Secondary | ICD-10-CM | POA: Diagnosis not present

## 2014-09-15 DIAGNOSIS — R34 Anuria and oliguria: Secondary | ICD-10-CM | POA: Diagnosis present

## 2014-09-15 DIAGNOSIS — E7800 Pure hypercholesterolemia, unspecified: Secondary | ICD-10-CM | POA: Diagnosis present

## 2014-09-15 DIAGNOSIS — R0600 Dyspnea, unspecified: Secondary | ICD-10-CM | POA: Diagnosis not present

## 2014-09-15 DIAGNOSIS — D72829 Elevated white blood cell count, unspecified: Secondary | ICD-10-CM | POA: Insufficient documentation

## 2014-09-15 DIAGNOSIS — Z881 Allergy status to other antibiotic agents status: Secondary | ICD-10-CM | POA: Diagnosis not present

## 2014-09-15 DIAGNOSIS — R160 Hepatomegaly, not elsewhere classified: Secondary | ICD-10-CM | POA: Diagnosis present

## 2014-09-15 DIAGNOSIS — D649 Anemia, unspecified: Secondary | ICD-10-CM | POA: Diagnosis present

## 2014-09-15 DIAGNOSIS — Z86711 Personal history of pulmonary embolism: Secondary | ICD-10-CM | POA: Diagnosis not present

## 2014-09-15 DIAGNOSIS — A499 Bacterial infection, unspecified: Secondary | ICD-10-CM | POA: Insufficient documentation

## 2014-09-15 DIAGNOSIS — I5033 Acute on chronic diastolic (congestive) heart failure: Secondary | ICD-10-CM | POA: Diagnosis not present

## 2014-09-15 DIAGNOSIS — R0602 Shortness of breath: Secondary | ICD-10-CM

## 2014-09-15 DIAGNOSIS — Z85828 Personal history of other malignant neoplasm of skin: Secondary | ICD-10-CM | POA: Diagnosis not present

## 2014-09-15 DIAGNOSIS — R42 Dizziness and giddiness: Secondary | ICD-10-CM | POA: Diagnosis not present

## 2014-09-15 DIAGNOSIS — I10 Essential (primary) hypertension: Secondary | ICD-10-CM | POA: Diagnosis present

## 2014-09-15 DIAGNOSIS — IMO0001 Reserved for inherently not codable concepts without codable children: Secondary | ICD-10-CM | POA: Insufficient documentation

## 2014-09-15 DIAGNOSIS — I959 Hypotension, unspecified: Secondary | ICD-10-CM | POA: Diagnosis not present

## 2014-09-15 DIAGNOSIS — Z7901 Long term (current) use of anticoagulants: Secondary | ICD-10-CM | POA: Diagnosis not present

## 2014-09-15 DIAGNOSIS — E785 Hyperlipidemia, unspecified: Secondary | ICD-10-CM | POA: Diagnosis present

## 2014-09-15 DIAGNOSIS — R509 Fever, unspecified: Secondary | ICD-10-CM | POA: Insufficient documentation

## 2014-09-15 DIAGNOSIS — I272 Other secondary pulmonary hypertension: Secondary | ICD-10-CM | POA: Diagnosis present

## 2014-09-15 DIAGNOSIS — K828 Other specified diseases of gallbladder: Secondary | ICD-10-CM | POA: Diagnosis not present

## 2014-09-15 DIAGNOSIS — I509 Heart failure, unspecified: Secondary | ICD-10-CM | POA: Diagnosis not present

## 2014-09-15 DIAGNOSIS — E871 Hypo-osmolality and hyponatremia: Secondary | ICD-10-CM | POA: Diagnosis present

## 2014-09-15 DIAGNOSIS — E876 Hypokalemia: Secondary | ICD-10-CM | POA: Diagnosis present

## 2014-09-15 DIAGNOSIS — R0603 Acute respiratory distress: Secondary | ICD-10-CM | POA: Insufficient documentation

## 2014-09-15 DIAGNOSIS — D500 Iron deficiency anemia secondary to blood loss (chronic): Secondary | ICD-10-CM | POA: Diagnosis not present

## 2014-09-15 DIAGNOSIS — K219 Gastro-esophageal reflux disease without esophagitis: Secondary | ICD-10-CM | POA: Diagnosis present

## 2014-09-15 DIAGNOSIS — Z86718 Personal history of other venous thrombosis and embolism: Secondary | ICD-10-CM | POA: Diagnosis not present

## 2014-09-15 DIAGNOSIS — I2699 Other pulmonary embolism without acute cor pulmonale: Secondary | ICD-10-CM | POA: Diagnosis not present

## 2014-09-15 DIAGNOSIS — Z882 Allergy status to sulfonamides status: Secondary | ICD-10-CM

## 2014-09-15 DIAGNOSIS — I480 Paroxysmal atrial fibrillation: Secondary | ICD-10-CM | POA: Diagnosis not present

## 2014-09-15 DIAGNOSIS — R918 Other nonspecific abnormal finding of lung field: Secondary | ICD-10-CM | POA: Diagnosis not present

## 2014-09-15 DIAGNOSIS — K7689 Other specified diseases of liver: Secondary | ICD-10-CM | POA: Diagnosis not present

## 2014-09-15 DIAGNOSIS — I481 Persistent atrial fibrillation: Secondary | ICD-10-CM | POA: Diagnosis not present

## 2014-09-15 DIAGNOSIS — Z66 Do not resuscitate: Secondary | ICD-10-CM | POA: Diagnosis present

## 2014-09-15 DIAGNOSIS — E874 Mixed disorder of acid-base balance: Secondary | ICD-10-CM | POA: Diagnosis present

## 2014-09-15 DIAGNOSIS — I5022 Chronic systolic (congestive) heart failure: Secondary | ICD-10-CM | POA: Diagnosis not present

## 2014-09-15 DIAGNOSIS — Z515 Encounter for palliative care: Secondary | ICD-10-CM | POA: Diagnosis not present

## 2014-09-15 DIAGNOSIS — R680 Hypothermia, not associated with low environmental temperature: Secondary | ICD-10-CM | POA: Diagnosis present

## 2014-09-15 DIAGNOSIS — Z791 Long term (current) use of non-steroidal anti-inflammatories (NSAID): Secondary | ICD-10-CM

## 2014-09-15 DIAGNOSIS — N281 Cyst of kidney, acquired: Secondary | ICD-10-CM | POA: Diagnosis not present

## 2014-09-15 DIAGNOSIS — I248 Other forms of acute ischemic heart disease: Secondary | ICD-10-CM | POA: Diagnosis present

## 2014-09-15 DIAGNOSIS — R748 Abnormal levels of other serum enzymes: Secondary | ICD-10-CM | POA: Diagnosis not present

## 2014-09-15 DIAGNOSIS — R531 Weakness: Secondary | ICD-10-CM | POA: Diagnosis not present

## 2014-09-15 HISTORY — DX: Acute kidney failure, unspecified: N17.9

## 2014-09-15 HISTORY — DX: Ulcerative colitis, unspecified, without complications: K51.90

## 2014-09-15 LAB — CBC WITH DIFFERENTIAL/PLATELET
Basophils Absolute: 0 10*3/uL (ref 0.0–0.1)
Basophils Relative: 0 % (ref 0–1)
Eosinophils Absolute: 0 10*3/uL (ref 0.0–0.7)
Eosinophils Relative: 0 % (ref 0–5)
HCT: 35.3 % — ABNORMAL LOW (ref 36.0–46.0)
Hemoglobin: 11.8 g/dL — ABNORMAL LOW (ref 12.0–15.0)
Lymphocytes Relative: 5 % — ABNORMAL LOW (ref 12–46)
Lymphs Abs: 0.6 10*3/uL — ABNORMAL LOW (ref 0.7–4.0)
MCH: 29.9 pg (ref 26.0–34.0)
MCHC: 33.4 g/dL (ref 30.0–36.0)
MCV: 89.6 fL (ref 78.0–100.0)
Monocytes Absolute: 0.6 10*3/uL (ref 0.1–1.0)
Monocytes Relative: 5 % (ref 3–12)
Neutro Abs: 11 10*3/uL — ABNORMAL HIGH (ref 1.7–7.7)
Neutrophils Relative %: 90 % — ABNORMAL HIGH (ref 43–77)
Platelets: 269 10*3/uL (ref 150–400)
RBC: 3.94 MIL/uL (ref 3.87–5.11)
RDW: 13.2 % (ref 11.5–15.5)
WBC: 12.1 10*3/uL — ABNORMAL HIGH (ref 4.0–10.5)

## 2014-09-15 LAB — URINALYSIS, ROUTINE W REFLEX MICROSCOPIC
Glucose, UA: NEGATIVE mg/dL
Hgb urine dipstick: NEGATIVE
Ketones, ur: NEGATIVE mg/dL
Nitrite: NEGATIVE
Protein, ur: 100 mg/dL — AB
Specific Gravity, Urine: 1.02 (ref 1.005–1.030)
Urobilinogen, UA: 0.2 mg/dL (ref 0.0–1.0)
pH: 5 (ref 5.0–8.0)

## 2014-09-15 LAB — TROPONIN I
Troponin I: 0.28 ng/mL — ABNORMAL HIGH (ref <0.031)
Troponin I: 0.34 ng/mL — ABNORMAL HIGH (ref <0.031)
Troponin I: 0.25 ng/mL — ABNORMAL HIGH (ref <0.031)

## 2014-09-15 LAB — COMPREHENSIVE METABOLIC PANEL
ALT: 21 U/L (ref 14–54)
AST: 40 U/L (ref 15–41)
Albumin: 2.5 g/dL — ABNORMAL LOW (ref 3.5–5.0)
Alkaline Phosphatase: 173 U/L — ABNORMAL HIGH (ref 38–126)
Anion gap: 13 (ref 5–15)
BUN: 31 mg/dL — ABNORMAL HIGH (ref 6–20)
CO2: 19 mmol/L — ABNORMAL LOW (ref 22–32)
Calcium: 8.2 mg/dL — ABNORMAL LOW (ref 8.9–10.3)
Chloride: 96 mmol/L — ABNORMAL LOW (ref 101–111)
Creatinine, Ser: 2.25 mg/dL — ABNORMAL HIGH (ref 0.44–1.00)
GFR calc Af Amer: 22 mL/min — ABNORMAL LOW (ref >60)
GFR calc non Af Amer: 19 mL/min — ABNORMAL LOW (ref >60)
Glucose, Bld: 88 mg/dL (ref 65–99)
Potassium: 3.7 mmol/L (ref 3.5–5.1)
Sodium: 128 mmol/L — ABNORMAL LOW (ref 135–145)
Total Bilirubin: 0.9 mg/dL (ref 0.3–1.2)
Total Protein: 6.2 g/dL — ABNORMAL LOW (ref 6.5–8.1)

## 2014-09-15 LAB — URINE MICROSCOPIC-ADD ON

## 2014-09-15 LAB — I-STAT TROPONIN, ED: Troponin i, poc: 0.32 ng/mL (ref 0.00–0.08)

## 2014-09-15 LAB — LACTIC ACID, PLASMA: Lactic Acid, Venous: 1.1 mmol/L (ref 0.5–2.0)

## 2014-09-15 MED ORDER — ACETAMINOPHEN 325 MG PO TABS
650.0000 mg | ORAL_TABLET | Freq: Four times a day (QID) | ORAL | Status: DC | PRN
  Administered 2014-09-16 – 2014-09-25 (×2): 650 mg via ORAL
  Filled 2014-09-15: qty 2

## 2014-09-15 MED ORDER — SODIUM CHLORIDE 0.9 % IV SOLN
INTRAVENOUS | Status: DC
  Administered 2014-09-15 – 2014-09-16 (×2): via INTRAVENOUS

## 2014-09-15 MED ORDER — METOPROLOL TARTRATE 100 MG PO TABS: 100.0000 mg | ORAL_TABLET | Freq: Two times a day (BID) | ORAL | Status: DC

## 2014-09-15 MED ORDER — SODIUM CHLORIDE 0.9 % IV SOLN: INTRAVENOUS | Status: DC

## 2014-09-15 MED ORDER — DEXTROSE 5 % IV SOLN
1.0000 g | INTRAVENOUS | Status: DC
Start: 2014-09-15 — End: 2014-09-16
  Administered 2014-09-15: 1 g via INTRAVENOUS
  Filled 2014-09-15 (×2): qty 10

## 2014-09-15 MED ORDER — PANTOPRAZOLE SODIUM 40 MG PO TBEC
40.0000 mg | DELAYED_RELEASE_TABLET | Freq: Every day | ORAL | Status: DC
  Administered 2014-09-15 – 2014-09-27 (×13): 40 mg via ORAL
  Filled 2014-09-15 (×14): qty 1

## 2014-09-15 MED ORDER — DILTIAZEM HCL ER COATED BEADS 180 MG PO CP24: 180.0000 mg | ORAL_CAPSULE | Freq: Every day | ORAL | Status: DC

## 2014-09-15 MED ORDER — ATORVASTATIN CALCIUM 10 MG PO TABS
10.0000 mg | ORAL_TABLET | Freq: Every day | ORAL | Status: DC
  Administered 2014-09-15 – 2014-09-26 (×10): 10 mg via ORAL
  Filled 2014-09-15 (×13): qty 1

## 2014-09-15 MED ORDER — SODIUM CHLORIDE 0.9 % IV SOLN
Freq: Once | INTRAVENOUS | Status: AC
  Administered 2014-09-15: 12:00:00 via INTRAVENOUS

## 2014-09-15 MED ORDER — METOPROLOL TARTRATE 25 MG PO TABS
25.0000 mg | ORAL_TABLET | Freq: Two times a day (BID) | ORAL | Status: DC
  Administered 2014-09-15 – 2014-09-16 (×2): 25 mg via ORAL
  Filled 2014-09-15 (×2): qty 1

## 2014-09-15 MED ORDER — ACETAMINOPHEN 650 MG RE SUPP
650.0000 mg | Freq: Four times a day (QID) | RECTAL | Status: DC | PRN
  Administered 2014-09-16 – 2014-09-18 (×2): 650 mg via RECTAL
  Filled 2014-09-15 (×2): qty 1

## 2014-09-15 MED ORDER — APIXABAN 2.5 MG PO TABS
2.5000 mg | ORAL_TABLET | Freq: Two times a day (BID) | ORAL | Status: DC
  Administered 2014-09-15 – 2014-09-16 (×2): 2.5 mg via ORAL
  Filled 2014-09-15 (×2): qty 1

## 2014-09-15 NOTE — ED Notes (Signed)
NOTIFIED S.BARRETT ,PA-C FOR PATIENTS LAB RESULTS OF I-STAT TROPONIN @11 :45AM .

## 2014-09-15 NOTE — H&P (Signed)
[facility] Admission History and Physical Service Pager: [phone]  Patient name: Sara Russell Medical record number: [id number] Date of birth: 1931-01-01 Age: 81 y.o. Gender: female  Primary Care Provider: Zigmund Gottron, MD Consultants: Cardiology  Code Status: Partial Code, DNI   Chief Complaint: SOB, weakness, and dizziness   Assessment and Plan: AVE SCHARNHORST is a 81 y.o. female presenting with SOB, fatigue and dizziness. PMH is significant for afib, PE (currently on eliquis), HTN, HLD, diverticulosis, GERD, esophageal stricture s/p dilatation, anemia.  # SOB/Dizziness: Consider Cardiac vs. Stroke vs. Infectious vs. PE. Unlikely stroke related, as patient denies any neurologic symptoms currently. Completely normal neurological exam. Patient denies symptoms similar to those she had with her Pulmonary Embolism, patient is currently anticoagulated. CXR negative for pneumonia. CXR however indicative of cardiomegaly without edema. Patient last recorded ECHO on 12/2012 EF 55-60%; Had an echo in June 2016 according to cardiology note with normal EF and no ischemia. Initial troponin 0.28, slightly elevated, most likely demand ischemia in setting of infection. EKG showing atrial fibrillation, however no acute changes. Satting well on Ra and breathing comfortably. Does not appear volume overloaded on exam or CXR. - Admit to FPTS, telemetry, attending [facility]   - Will get an ECHO in the morning   - Will get a morning EKG  - Trend troponin x3 qh6  - Cardiology recs  - O2 prn, monitor respiratory status - telemetry monitoring  # Sepsis 2/2 UTI: UA positive for moderate leukocytes, however without dysuria. Meets SIRS criteria with tachypnea and WBC 12.1. Now hypotensive as well. Tmax 100.3. - Urine Culture, blood cultures pending - start Ceftriaxone 1g q24h  - monitor fever curve  # AKI. Patient denies decreased fluid intake. However patient with moist  mucous membranes. SCr 2.25. (baseline 0.6-1) - Will hold home Lotensin  - Will continue morning BMP  - Will continue maintenance fluids @ 125 cc NS   # Chronic Atrial Fibrillation. Current EKG with A-fib and rate controlled.  - Hold home Diltiazem and decrease home Lopressor to 44m BID (home dose 108mBID) in setting of hypotension  - Continue home Eliquis 28m18mID  -  Morning EKG   # Mild Normocytic Anemia. Hx of Anemia. Normal range hgb 12.0-14. Hgb 11.8/Hct 35.3 on admission - Continue to monitor   # Hx of PE/DVT: On Eliquis and reports compliance, doubt recurrent clot as cause of current symptoms - Continue home Eliquis  # Hypotension with h/o HTN: BP in 80-38-10Fstolic  - Decrease Lopressor to 228m53mD  - Holding Lotensin, Diltiazem  # HLD: Last lipid panel in 07/2013 with LDL 30 - Continue home atorvastatin - Repeat FLP in AM  FEN/GI: - Heart Diet  - home PPI  - NS @ 1228mL2mProphylaxis: on home Eliquis   Disposition: Admit to FPTS, telemetry, attending [facility]   History of Present Illness: Sara Russell 84 y.54 female presenting with dizziness and SOB.  Patient states that she has had a difficult time since the summer started, has had on/off chills primarily in the morning and sometimes at night; this was around the time of the heat wave with temperatures in 90s. Yesterday she had a "bad day", when she was walking from car to an appt she got extremely short of breath, causing her to have to stop walking multiple times. When she got home she continued to be SOB particularly when going up stairs. During the SOB episodes she felt  dizzy and unsteady on her feet. This morning feels better, called son [name] around [place] and was leary of falling so decided to come in to the ED. Son has noticed that she seems a bit more tired/fatigued, having to hold on to things to help her move around. Patient denies fever, chest pain, sore throat, congestion, dysuria, confusion, focal weakness  or paresthesias, lower extremity edema, syncope, cough,     Review Of Systems: Per HPI with the following additions: runny nose, no sorethroat, no blurry vision, no orthopnea, no rashes.  Otherwise 12 point review of systems was performed and was unremarkable.  Patient Active Problem List   Diagnosis Date Noted  . AKI (acute kidney injury) 09/15/2014  . SOB (shortness of breath) 09/15/2014  . Osteopenia of the elderly 05/13/2014  . Abdominal mass of other site 05/13/2014  . Diastolic CHF 55/37/4827  . Gout 08/16/2013  . Hyponatremia 08/10/2013  . Left atrial enlargement 01/10/2013  . A-fib 12/08/2012  . Rosacea 05/15/2012  . Breast cancer screening 03/14/2011  . Anemia due to chronic blood loss 08/28/2010  . Grief 04/29/2010  . DEGENERATIVE DISC DISEASE, LUMBAR SPINE 07/14/2009  . DVT 07/11/2009  . GERD 07/11/2009  . PERSONAL HX COLONIC POLYPS 07/11/2009  . HYPERCHOLESTEROLEMIA 04/10/2006  . HYPERTENSION, BENIGN SYSTEMIC 04/10/2006  . Pulmonary embolism and infarction 04/10/2006  . DIVERTICULOSIS OF COLON 04/10/2006  . ACTINIC KERATOSIS 04/10/2006  . GEN OSTEOARTHROSIS INVOLVING MULTIPLE SITES 04/10/2006   Past Medical History: Past Medical History  Diagnosis Date  . S/P dilatation of esophageal stricture 2002  . Hypertension   . Hyperlipidemia   . DVT (deep venous thrombosis) 2009  . PE (pulmonary embolism) 2009  . GERD (gastroesophageal reflux disease)   . Diverticulosis   . Iron deficiency anemia   . Hyperplastic colon polyp   . Positive H. pylori test 01/20/96  . Degenerative joint disease   . Skin cancer     h/o basal and squamous skin cancer--> removed  . Persistent atrial fibrillation    Past Surgical History: Past Surgical History  Procedure Laterality Date  . Dilation and curettage of uterus      for endometrial polyps  . Replacement total knee      left  . Tonsillectomy    . Appendectomy    . Breast lumpectomy      benign   Social  History: History  Substance Use Topics  . Smoking status: Former Smoker -- 0.50 packs/day for 13 years    Types: Cigarettes    Start date: 02/12/1948    Quit date: 02/11/1961  . Smokeless tobacco: Never Used     Comment: Denies use of tobacco since 1963  . Alcohol Use: 0.0 oz/week    7-14 Glasses of wine per week     Comment: 1-2 glasses of wine per day   Additional social history: lives at home alone Please also refer to relevant sections of EMR.  Family History: Family History  Problem Relation Age of Onset  . Esophageal cancer Brother   . Diverticulitis Brother   . Colon cancer Neg Hx   . Heart disease Mother   . Stroke Mother    Allergies and Medications: Allergies  Allergen Reactions  . Iohexol Other (See Comments)    unknown  . Ivp Dye [Iodinated Diagnostic Agents] Hives  . Scallops [Shellfish Allergy] Other (See Comments)    Projectile vomiting.  . Sulfa Antibiotics Hives  . Sulfamethoxazole Hives    REACTION: unspecified   No  current facility-administered medications on file prior to encounter.   Current Outpatient Prescriptions on File Prior to Encounter  Medication Sig Dispense Refill  . acetaminophen (TYLENOL) 650 MG CR tablet Take 650 mg by mouth every 8 (eight) hours as needed for pain.    Marland Kitchen atorvastatin (LIPITOR) 10 MG tablet TAKE ONE TABLET BY MOUTH ONCE DAILY AT  6PM 90 tablet 3  . benazepril (LOTENSIN) 40 MG tablet TAKE ONE TABLET BY MOUTH ONCE DAILY 90 tablet 3  . calcium carbonate (TUMS - DOSED IN MG ELEMENTAL CALCIUM) 500 MG chewable tablet Chew 1 tablet by mouth 2 (two) times daily.      . calcium-vitamin D (SM CALCIUM-VITAMIN D) 500-200 MG-UNIT per tablet Take 1 tablet by mouth 2 (two) times daily.     Marland Kitchen diltiazem (CARDIZEM CD) 180 MG 24 hr capsule Take 1 capsule (180 mg total) by mouth daily. 90 capsule 3  . ELIQUIS 5 MG TABS tablet TAKE 1 TABLET TWICE A DAY 180 tablet 0  . ferrous sulfate 325 (65 FE) MG tablet Take 1 tablet (325 mg total) by  mouth daily with breakfast. 1 tablet 0  . metoprolol (LOPRESSOR) 100 MG tablet TAKE ONE TABLET BY MOUTH TWICE DAILY 180 tablet 3  . omeprazole (PRILOSEC) 40 MG capsule TAKE ONE CAPSULE BY MOUTH ONCE DAILY 90 capsule 3  . traMADol (ULTRAM) 50 MG tablet TAKE ONE TABLET BY MOUTH EVERY 6 HOURS AS NEEDED FOR PAIN 180 tablet 1  . [DISCONTINUED] omeprazole (PRILOSEC OTC) 20 MG tablet Take 20 mg by mouth 2 (two) times daily.        Objective: BP 98/61 mmHg  Pulse 65  Temp(Src) 100.3 F (37.9 C) (Oral)  Resp 29  Ht 5' 1"  (1.549 m)  Wt 148 lb 13 oz (67.5 kg)  BMI 28.13 kg/m2  SpO2 100% Exam: General: Pleasant elderly women, laying in bed in NAD Eyes: Pupils Equal Round Reactive to light, Extraocular movements intact, Conjunctiva without redness or discharge ENTM: Nose:  External nasal examination shows no deformity or inflammation. Nasal mucosa are pink and moist without lesions or exudates.  Neck: No deformities, thyromegaly, masses, or tenderness noted. Supple with full range of motion without pain. Cardiovascular: Irregular rate and rhythm.  No murmurs, gallops or rubs, 2+ radial pulses  Respiratory: CTAB, no wheezes, rales, crackles Abdomen: BS+, NTND MSK: Upper and lower extremity movement intact  Skin: Without rashes or signs of infection  Neuro: Cn 2-7 intact, Strength equal & normal in upper & lower extremities Psych: Cognition and judgment appear intact. Alert, communicative  and cooperative with normal attention span and concentration.    Labs and Imaging: CBC BMET   Recent Labs Lab 09/15/14 1100  WBC 12.1*  HGB 11.8*  HCT 35.3*  PLT 269    Recent Labs Lab 09/15/14 1100  NA 128*  K 3.7  CL 96*  CO2 19*  BUN 31*  CREATININE 2.25*  GLUCOSE 88  CALCIUM 8.2*      Urinalysis    Component Value Date/Time   COLORURINE AMBER* 09/15/2014 1428   APPEARANCEUR CLOUDY* 09/15/2014 1428   LABSPEC 1.020 09/15/2014 1428   PHURINE 5.0 09/15/2014 1428   GLUCOSEU NEGATIVE  09/15/2014 1428   HGBUR NEGATIVE 09/15/2014 1428   BILIRUBINUR SMALL* 09/15/2014 1428   [place] 08/28/2010 1018   KETONESUR NEGATIVE 09/15/2014 1428   PROTEINUR 100* 09/15/2014 1428   PROTEINUR 100 08/28/2010 1018   UROBILINOGEN 0.2 09/15/2014 1428   UROBILINOGEN 0.2 08/28/2010 1018  NITRITE NEGATIVE 09/15/2014 1428   NITRITE NEG 08/28/2010 1018   LEUKOCYTESUR MODERATE* 09/15/2014 1428    Troponin 0.28, 0.32  EKG: Afib, nonischemic  Images  Dg Chest 2 View  09/15/2014   CLINICAL DATA:  Shortness of breath. Pain when breathing for 2 days.  EXAM: CHEST  2 VIEW  COMPARISON:  03/20/2014  FINDINGS: Heart size is mildly enlarged. Moderate hiatal hernia present. There are no focal consolidations. No pleural effusions. No pulmonary edema. Mild scoliosis.  IMPRESSION: 1. Cardiomegaly without edema. 2. Hiatal hernia.   Electronically Signed   By: Nolon Nations M.D.   On: 09/15/2014 11:31     Asiyah Cletis Media, MD 09/15/2014, 3:38 PM PGY-1, [facility] pager: [phone], text pages welcome   Upper Level Addendum:  I have seen and evaluated this patient along with Dr. Emmaline Life and reviewed the above note, making necessary revisions in pink.  Virginia Crews, MD, MPH PGY-2,  [facility] 09/15/2014 3:41 PM

## 2014-09-15 NOTE — ED Provider Notes (Signed)
12:03 PM Patient seen in conjunction with Barrett PA-C. Patient with h/o chronic afib, PE on Eliquis -- presents with c/o SOB yesterday, especially with exertion. She became profoundly SOB with ambulation and felt lightheaded. Symptoms were improved with rest. Denies other acute complaints except for being very thirsty.  Workup in the ED demonstrates mildly elevated troponin to 0.32 and new acute kidney injury with a creatinine of 2.5 and a BUN of 31. Chest x-ray is clear.  Will admit for further evaluation. Patient has been seen by and discussed with Dr. Venora Maples.   Will call [facility] for admit.   Carlisle Cater, PA-C 09/15/14 [place], MD 09/15/14 [place], MD 09/15/14 [phone]

## 2014-09-15 NOTE — Progress Notes (Signed)
Patient trasfered from ED to 5W09 via stretcher; alert and oriented x 4; no complaints of pain; IV in RFA running NS@125cc /hr; skin intact (skin was checked by nurse Delcine and nurse Alvis Lemmings).  Orient patient to room and unit; watch safety video; gave patient care guide; instructed how to use the call bell and  fall risk precautions. Will continue to monitor the patient.

## 2014-09-15 NOTE — ED Notes (Signed)
Pt with hx of afib to ED c/o sob and dizziness with ambulation since yesterday.

## 2014-09-15 NOTE — Consult Note (Signed)
Reason for Consult: elevated troponins, weakness   Referring Physician: ER MD- Dr. Venora Maples  PCP:  Zigmund Gottron, MD  Primary Cardiologist:Dr. Coral Ceo Sara Russell is an 81 y.o. female.    Chief Complaint: pt presented today with SOB and dizziness with ambulation since yesterday.    HPI: 81 year old female with hx of permanent atrial fib., with  a nuclear scan which showed normal EF-85% and no ischemia 07/2013.  She is on Eliquis with CHA2DS2VASc score of 4.  Echo in 2014 with EF 55-60%, no wall motion abnormalities. LA severely dilated. Mod TR, PA pk pressure 37 mmHG.  She presents today with SOB and dizziness with acute renal failure with Cr of 2.25, BUN 31. Last cr in Feb 2016 was 0.87 and BUN of 8.  Elevated Alk. Phos at 173.  Her troponin is 0.28 and 0.32. WBC elevated at 12.1.  New cardiomegaly from 03/2014.  Also with fever.    She felt fine until yesterday walking across the parking lot she became very SOB.  She rested in her car and it improved, but returned with exertion.  No chest pain.  Once she rested at home it resolved.  She was dizzy as well.  She had been having feelings of hot then chills.  Her urine has been dark.  She did sleep fairly well last pm but today she was worried something was wrong and came to ER.  Currently without complaints.  EKG : Atrial fibrillation with premature ventricular or aberrantly conducted complexes Right axis deviation Possible Right ventricular hypertrophy Anteroseptal infarct , age undetermined Prolonged QT Abnormal ECG  Though no acute changes.     Past Medical History  Diagnosis Date  . S/P dilatation of esophageal stricture 2002  . Hypertension   . Hyperlipidemia   . DVT (deep venous thrombosis) 2009  . PE (pulmonary embolism) 2009  . GERD (gastroesophageal reflux disease)   . Diverticulosis   . Iron deficiency anemia   . Hyperplastic colon polyp   . Positive H. pylori test 01/20/96  . Degenerative joint  disease   . Skin cancer     h/o basal and squamous skin cancer--> removed  . Persistent atrial fibrillation     Past Surgical History  Procedure Laterality Date  . Dilation and curettage of uterus      for endometrial polyps  . Replacement total knee      left  . Tonsillectomy    . Appendectomy    . Breast lumpectomy      benign    Family History  Problem Relation Age of Onset  . Esophageal cancer Brother   . Diverticulitis Brother   . Colon cancer Neg Hx   . Heart disease Mother   . Stroke Mother    Social History:  reports that she quit smoking about 53 years ago. Her smoking use included Cigarettes. She started smoking about 66 years ago. She has a 6.5 pack-year smoking history. She has never used smokeless tobacco. She reports that she drinks alcohol. She reports that she does not use illicit drugs.  Allergies:  Allergies  Allergen Reactions  . Iohexol Other (See Comments)    unknown  . Ivp Dye [Iodinated Diagnostic Agents] Hives  . Scallops [Shellfish Allergy] Other (See Comments)    Projectile vomiting.  . Sulfa Antibiotics Hives  . Sulfamethoxazole Hives    REACTION: unspecified    Outpatient Medications: No current facility-administered medications on file  prior to encounter.   Current Outpatient Prescriptions on File Prior to Encounter  Medication Sig Dispense Refill  . acetaminophen (TYLENOL) 650 MG CR tablet Take 650 mg by mouth every 8 (eight) hours as needed for pain.    Marland Kitchen atorvastatin (LIPITOR) 10 MG tablet TAKE ONE TABLET BY MOUTH ONCE DAILY AT  6PM 90 tablet 3  . benazepril (LOTENSIN) 40 MG tablet TAKE ONE TABLET BY MOUTH ONCE DAILY 90 tablet 3  . calcium carbonate (TUMS - DOSED IN MG ELEMENTAL CALCIUM) 500 MG chewable tablet Chew 1 tablet by mouth 2 (two) times daily.      . calcium-vitamin D (SM CALCIUM-VITAMIN D) 500-200 MG-UNIT per tablet Take 1 tablet by mouth 2 (two) times daily.     Marland Kitchen diltiazem (CARDIZEM CD) 180 MG 24 hr capsule Take 1 capsule  (180 mg total) by mouth daily. 90 capsule 3  . ELIQUIS 5 MG TABS tablet TAKE 1 TABLET TWICE A DAY 180 tablet 0  . ferrous sulfate 325 (65 FE) MG tablet Take 1 tablet (325 mg total) by mouth daily with breakfast. 1 tablet 0  . metoprolol (LOPRESSOR) 100 MG tablet TAKE ONE TABLET BY MOUTH TWICE DAILY 180 tablet 3  . omeprazole (PRILOSEC) 40 MG capsule TAKE ONE CAPSULE BY MOUTH ONCE DAILY 90 capsule 3  . traMADol (ULTRAM) 50 MG tablet TAKE ONE TABLET BY MOUTH EVERY 6 HOURS AS NEEDED FOR PAIN 180 tablet 1  . [DISCONTINUED] omeprazole (PRILOSEC OTC) 20 MG tablet Take 20 mg by mouth 2 (two) times daily.         Results for orders placed or performed during the hospital encounter of 09/15/14 (from the past 48 hour(s))  CBC with Differential     Status: Abnormal   Collection Time: 09/15/14 11:00 AM  Result Value Ref Range   WBC 12.1 (H) 4.0 - 10.5 K/uL   RBC 3.94 3.87 - 5.11 MIL/uL   Hemoglobin 11.8 (L) 12.0 - 15.0 g/dL   HCT 35.3 (L) 36.0 - 46.0 %   MCV 89.6 78.0 - 100.0 fL   MCH 29.9 26.0 - 34.0 pg   MCHC 33.4 30.0 - 36.0 g/dL   RDW 13.2 11.5 - 15.5 %   Platelets 269 150 - 400 K/uL   Neutrophils Relative % 90 (H) 43 - 77 %   Neutro Abs 11.0 (H) 1.7 - 7.7 K/uL   Lymphocytes Relative 5 (L) 12 - 46 %   Lymphs Abs 0.6 (L) 0.7 - 4.0 K/uL   Monocytes Relative 5 3 - 12 %   Monocytes Absolute 0.6 0.1 - 1.0 K/uL   Eosinophils Relative 0 0 - 5 %   Eosinophils Absolute 0.0 0.0 - 0.7 K/uL   Basophils Relative 0 0 - 1 %   Basophils Absolute 0.0 0.0 - 0.1 K/uL  Comprehensive metabolic panel     Status: Abnormal   Collection Time: 09/15/14 11:00 AM  Result Value Ref Range   Sodium 128 (L) 135 - 145 mmol/L   Potassium 3.7 3.5 - 5.1 mmol/L   Chloride 96 (L) 101 - 111 mmol/L   CO2 19 (L) 22 - 32 mmol/L   Glucose, Bld 88 65 - 99 mg/dL   BUN 31 (H) 6 - 20 mg/dL   Creatinine, Ser 2.25 (H) 0.44 - 1.00 mg/dL   Calcium 8.2 (L) 8.9 - 10.3 mg/dL   Total Protein 6.2 (L) 6.5 - 8.1 g/dL   Albumin 2.5 (L)  3.5 - 5.0 g/dL   AST 40  15 - 41 U/L   ALT 21 14 - 54 U/L   Alkaline Phosphatase 173 (H) 38 - 126 U/L   Total Bilirubin 0.9 0.3 - 1.2 mg/dL   GFR calc non Af Amer 19 (L) >60 mL/min   GFR calc Af Amer 22 (L) >60 mL/min    Comment: (NOTE) The eGFR has been calculated using the CKD EPI equation. This calculation has not been validated in all clinical situations. eGFR's persistently <60 mL/min signify possible Chronic Kidney Disease.    Anion gap 13 5 - 15  Troponin I     Status: Abnormal   Collection Time: 09/15/14 11:00 AM  Result Value Ref Range   Troponin I 0.28 (H) <0.031 ng/mL    Comment:        PERSISTENTLY INCREASED TROPONIN VALUES IN THE RANGE OF 0.04-0.49 ng/mL CAN BE SEEN IN:       -UNSTABLE ANGINA       -CONGESTIVE HEART FAILURE       -MYOCARDITIS       -CHEST TRAUMA       -ARRYHTHMIAS       -LATE PRESENTING MYOCARDIAL INFARCTION       -COPD   CLINICAL FOLLOW-UP RECOMMENDED.   I-Stat Troponin, ED (not at [facility])     Status: Abnormal   Collection Time: 09/15/14 11:20 AM  Result Value Ref Range   Troponin i, poc 0.32 (HH) 0.00 - 0.08 ng/mL   Comment NOTIFIED PHYSICIAN    Comment 3            Comment: Due to the release kinetics of cTnI, a negative result within the first hours of the onset of symptoms does not rule out myocardial infarction with certainty. If myocardial infarction is still suspected, repeat the test at appropriate intervals.    Dg Chest 2 View  09/15/2014   CLINICAL DATA:  Shortness of breath. Pain when breathing for 2 days.  EXAM: CHEST  2 VIEW  COMPARISON:  03/20/2014  FINDINGS: Heart size is mildly enlarged. Moderate hiatal hernia present. There are no focal consolidations. No pleural effusions. No pulmonary edema. Mild scoliosis.  IMPRESSION: 1. Cardiomegaly without edema. 2. Hiatal hernia.   Electronically Signed   By: Nolon Nations M.D.   On: 09/15/2014 11:31    ROS: General:no colds ? Fevers would feel cold then hot.,  weight sown 10 lbs  since April Skin:no rashes or ulcers, no tick bites  HEENT:no blurred vision, no congestion CV:see HPI PUL:see HPI GI:no diarrhea constipation or melena, stools are dark due to her Iron medication no indigestion GU:no hematuria, no dysuria, no abd/flank pain MS:no joint pain, no claudication Neuro:no syncope, + mild yesterday lightheadedness Endo:no diabetes, no thyroid disease   Blood pressure 108/70, pulse 88, temperature 98.4 F (36.9 C), temperature source Oral, resp. rate 29, height 5' 1.5" (1.562 m), weight 145 lb (65.772 kg), SpO2 98 %.  Wt Readings from Last 3 Encounters:  09/15/14 145 lb (65.772 kg)  05/16/14 154 lb 12.8 oz (70.217 kg)  05/13/14 155 lb 8 oz (70.534 kg)    PE: General:Pleasant affect, NAD Skin:Very Warm and dry, brisk capillary refill HEENT:normocephalic, sclera clear, mucus membranes moist Neck:supple, no JVD, no bruits  Heart:irreg irreg  without murmur, gallup, rub or click Lungs: without rales,  + rhonchi, no  wheezes HRC:BULA, non tender, + BS, do not palpate liver spleen or masses Ext:no lower ext edema, 2+ pedal pulses, 2+ radial pulses Neuro:alert and oriented X 3, MAE, follows commands, +  facial symmetry    Assessment/Plan Active Problems:   HYPERCHOLESTEROLEMIA   HYPERTENSION, BENIGN SYSTEMIC   Pulmonary embolism and infarction   GERD   A-fib   Hyponatremia   AKI (acute kidney injury)   SOB (shortness of breath)  Elevated Troponin- no chest pain, agree with echo with cardiomegaly on CXR that is new. Would follow serial troponins but this may be demand ischemia with acute illness and acute renal failure.  She did have negative Nuc about a year ago.  Other thought is takotsubo cardiomyopathy with acute illness.   Continue eliquis for now will ask pharmacy to check dose.    Acute renal failure- per Im but agree holding ACE   Hypotension hold ACE and BB, Dilt though if can tolerate any med we would prefer BB to prevent rebound  tachycardia.  Permanent atrial fib, with CHA2DS2VASc score 4 continue eliquis or if needed change to IV heparin.  Campus  Nurse Practitioner Certified [place] Pager [phone] or after 5pm or weekends call [phone] 09/15/2014, 2:20 PM     Personally seen and examined. Agree with above. Troponin is minimally elevated. We will continue to monitor. Check echocardiogram. Could shortness of breath p.m. and the station of new onset cardiomyopathy. Myocardial megaly noted on chest x-ray. No new medications at this time. Getting hydrated appropriately for reduced renal function, acute kidney injury. She does not appear to be septic at this point. We will monitor closely. Candee Furbish, MD

## 2014-09-15 NOTE — Progress Notes (Signed)
Reviewed with patient blood pressure readings d/t lopressor scheduled at this time, patient requesting to take med now stating "I take it every night for my heart". Med given per request, will monitor.

## 2014-09-15 NOTE — ED Notes (Signed)
PA at bedside.

## 2014-09-15 NOTE — ED Provider Notes (Signed)
CSN: [id number]     Arrival date & time 09/15/14  4098 History   First MD Initiated Contact with Patient 09/15/14 1018     Chief Complaint  Patient presents with  . Dizziness  . Shortness of Breath   Sara Russell is an 81 yo female with a PMHx of rate controlled a fib presenting with a 1 day history of shortness of breath and lightheadedness with ambulation. She reports that this is a new problem and she previously ambulated with no issues other than ankle pain from OA. She describes the lightheadedness as "feeling like she might faint". The symptoms are fully relieved by sitting down. The symptoms were more severe yesterday and occurred every time she stood to walk. Today, she is able to walk with only mild symptoms. Her son is present in the room and confirms that she appears to be a little more unstable than usual. Sara Russell lives alone and was not observed walking yesterday. She reports no new medications or changes in medication. She is eating and drinking normally but states that she feels thirstier than usual. Denies fever, chest pain, chest tightness, palpitations, SOB at rest, cough, nausea, vomiting, blood in stool, hematuria, calf pain or leg swelling  Patient is a 81 y.o. female presenting with dizziness and shortness of breath.  Dizziness Associated symptoms: shortness of breath   Associated symptoms: no blood in stool, no chest pain, no headaches, no nausea, no palpitations and no vomiting   Shortness of Breath Associated symptoms: no abdominal pain, no chest pain, no cough, no fever, no headaches, no vomiting and no wheezing    .   Past Medical History  Diagnosis Date  . S/P dilatation of esophageal stricture 2002  . Hypertension   . Hyperlipidemia   . DVT (deep venous thrombosis) 2009  . PE (pulmonary embolism) 2009  . GERD (gastroesophageal reflux disease)   . Diverticulosis   . Iron deficiency anemia   . Hyperplastic colon polyp   . Positive H. pylori test 01/20/96  .  Degenerative joint disease   . Skin cancer     h/o basal and squamous skin cancer--> removed  . Persistent atrial fibrillation    Past Surgical History  Procedure Laterality Date  . Dilation and curettage of uterus      for endometrial polyps  . Replacement total knee      left  . Tonsillectomy    . Appendectomy    . Breast lumpectomy      benign   Family History  Problem Relation Age of Onset  . Esophageal cancer Brother   . Diverticulitis Brother   . Colon cancer Neg Hx   . Heart disease Mother   . Stroke Mother    History  Substance Use Topics  . Smoking status: Former Smoker -- 0.50 packs/day for 13 years    Types: Cigarettes    Start date: 02/12/1948    Quit date: 02/11/1961  . Smokeless tobacco: Never Used     Comment: Denies use of tobacco since 1963  . Alcohol Use: 0.0 oz/week    7-14 Glasses of wine per week     Comment: 1-2 glasses of wine per day   OB History    No data available     Review of Systems  Constitutional: Positive for fatigue. Negative for fever.  Respiratory: Positive for shortness of breath. Negative for cough, chest tightness and wheezing.   Cardiovascular: Negative for chest pain and palpitations.  Gastrointestinal: Negative for  nausea, vomiting, abdominal pain and blood in stool.  Genitourinary: Negative for dysuria and hematuria.  Musculoskeletal: Positive for arthralgias (ankle OA).  Neurological: Positive for dizziness and light-headedness. Negative for syncope and headaches.      Allergies  Iohexol; Ivp dye; Scallops; Sulfa antibiotics; and Sulfamethoxazole  Home Medications   Prior to Admission medications   Medication Sig Start Date End Date Taking? Authorizing Provider  acetaminophen (TYLENOL) 650 MG CR tablet Take 650 mg by mouth every 8 (eight) hours as needed for pain.   Yes Historical Provider, MD  atorvastatin (LIPITOR) 10 MG tablet TAKE ONE TABLET BY MOUTH ONCE DAILY AT  6PM 08/29/14  Yes Zenia Resides, MD   benazepril (LOTENSIN) 40 MG tablet TAKE ONE TABLET BY MOUTH ONCE DAILY 06/24/14  Yes Zenia Resides, MD  calcium carbonate (TUMS - DOSED IN MG ELEMENTAL CALCIUM) 500 MG chewable tablet Chew 1 tablet by mouth 2 (two) times daily.     Yes Historical Provider, MD  calcium-vitamin D (SM CALCIUM-VITAMIN D) 500-200 MG-UNIT per tablet Take 1 tablet by mouth 2 (two) times daily.    Yes Historical Provider, MD  diltiazem (CARDIZEM CD) 180 MG 24 hr capsule Take 1 capsule (180 mg total) by mouth daily. 12/03/13  Yes Zenia Resides, MD  ELIQUIS 5 MG TABS tablet TAKE 1 TABLET TWICE A DAY 07/21/14  Yes Thompson Grayer, MD  ferrous sulfate 325 (65 FE) MG tablet Take 1 tablet (325 mg total) by mouth daily with breakfast. 06/04/13  Yes Zenia Resides, MD  metoprolol (LOPRESSOR) 100 MG tablet TAKE ONE TABLET BY MOUTH TWICE DAILY 01/04/14  Yes Zenia Resides, MD  omeprazole (PRILOSEC) 40 MG capsule TAKE ONE CAPSULE BY MOUTH ONCE DAILY 06/14/14  Yes Zenia Resides, MD  traMADol (ULTRAM) 50 MG tablet TAKE ONE TABLET BY MOUTH EVERY 6 HOURS AS NEEDED FOR PAIN 08/29/14  Yes Zenia Resides, MD   BP 94/65 mmHg  Pulse 110  Temp(Src) 98.4 F (36.9 C) (Oral)  Resp 26  Ht 5' 1.5" (1.562 m)  Wt 145 lb (65.772 kg)  BMI 26.96 kg/m2  SpO2 97% Physical Exam  Constitutional: She is oriented to person, place, and time. She appears well-developed and well-nourished. No distress.  Cardiovascular: Normal rate.  An irregularly irregular rhythm present.  Pulmonary/Chest: Effort normal and breath sounds normal.  Abdominal: Soft. There is no tenderness.  Neurological: She is alert and oriented to person, place, and time.  Skin: Skin is warm and dry.    ED Course  Procedures (including critical care time) Labs Review Labs Reviewed  CBC WITH DIFFERENTIAL/PLATELET - Abnormal; Notable for the following:    WBC 12.1 (*)    Hemoglobin 11.8 (*)    HCT 35.3 (*)    Neutrophils Relative % 90 (*)    Neutro Abs 11.0 (*)     Lymphocytes Relative 5 (*)    Lymphs Abs 0.6 (*)    All other components within normal limits  COMPREHENSIVE METABOLIC PANEL - Abnormal; Notable for the following:    Sodium 128 (*)    Chloride 96 (*)    CO2 19 (*)    BUN 31 (*)    Creatinine, Ser 2.25 (*)    Calcium 8.2 (*)    Total Protein 6.2 (*)    Albumin 2.5 (*)    Alkaline Phosphatase 173 (*)    GFR calc non Af Amer 19 (*)    GFR calc Af Amer 22 (*)  All other components within normal limits  I-STAT TROPOININ, ED - Abnormal; Notable for the following:    Troponin i, poc 0.32 (*)    All other components within normal limits  TROPONIN I  URINALYSIS, ROUTINE W REFLEX MICROSCOPIC (NOT AT [facility])    Imaging Review Dg Chest 2 View  09/15/2014   CLINICAL DATA:  Shortness of breath. Pain when breathing for 2 days.  EXAM: CHEST  2 VIEW  COMPARISON:  03/20/2014  FINDINGS: Heart size is mildly enlarged. Moderate hiatal hernia present. There are no focal consolidations. No pleural effusions. No pulmonary edema. Mild scoliosis.  IMPRESSION: 1. Cardiomegaly without edema. 2. Hiatal hernia.   Electronically Signed   By: Nolon Nations M.D.   On: 09/15/2014 11:31     EKG Interpretation   Date/Time:  Thursday September 15 2014 10:03:55 EDT Ventricular Rate:  98 PR Interval:    QRS Duration: 66 QT Interval:  420 QTC Calculation: 536 R Axis:   175 Text Interpretation:  Atrial fibrillation with premature ventricular or  aberrantly conducted complexes Right axis deviation Possible Right  ventricular hypertrophy Anteroseptal infarct , age undetermined Prolonged  QT Abnormal ECG No significant change was found Confirmed by CAMPOS  MD,  KEVIN ([id number]) on 09/15/2014 12:04:25 PM     BP 94/65 mmHg  Pulse 110  Temp(Src) 98.4 F (36.9 C) (Oral)  Resp 26  Ht 5' 1.5" (1.562 m)  Wt 145 lb (65.772 kg)  BMI 26.96 kg/m2  SpO2 97%  MDM   Final diagnoses:  None    1. Shortness of breath, dizziness - CBC, CMP, troponin, CXR in ED - CXR  unremarkable - Troponin elevated to 0.32 - Acute kidney injury with creatinine of 2.25, up from 0.87 six months ago and BUN of 31, up from 8 six months ago - Admit for further cardiac and kidney workup - 125 cc/hr NS    Josephina Gip, PA-C 09/15/14 [place], MD 09/15/14 1340

## 2014-09-16 ENCOUNTER — Inpatient Hospital Stay (HOSPITAL_COMMUNITY): Payer: Medicare Other

## 2014-09-16 DIAGNOSIS — R0602 Shortness of breath: Secondary | ICD-10-CM

## 2014-09-16 DIAGNOSIS — I481 Persistent atrial fibrillation: Secondary | ICD-10-CM

## 2014-09-16 DIAGNOSIS — N3000 Acute cystitis without hematuria: Secondary | ICD-10-CM

## 2014-09-16 DIAGNOSIS — R0600 Dyspnea, unspecified: Secondary | ICD-10-CM

## 2014-09-16 DIAGNOSIS — I10 Essential (primary) hypertension: Secondary | ICD-10-CM

## 2014-09-16 LAB — BASIC METABOLIC PANEL
Anion gap: 13 (ref 5–15)
Anion gap: 16 — ABNORMAL HIGH (ref 5–15)
BUN: 23 mg/dL — ABNORMAL HIGH (ref 6–20)
BUN: 23 mg/dL — ABNORMAL HIGH (ref 6–20)
CO2: 17 mmol/L — ABNORMAL LOW (ref 22–32)
CO2: 16 mmol/L — ABNORMAL LOW (ref 22–32)
Calcium: 7.3 mg/dL — ABNORMAL LOW (ref 8.9–10.3)
Calcium: 7.5 mg/dL — ABNORMAL LOW (ref 8.9–10.3)
Chloride: 97 mmol/L — ABNORMAL LOW (ref 101–111)
Chloride: 96 mmol/L — ABNORMAL LOW (ref 101–111)
Creatinine, Ser: 1.6 mg/dL — ABNORMAL HIGH (ref 0.44–1.00)
Creatinine, Ser: 1.68 mg/dL — ABNORMAL HIGH (ref 0.44–1.00)
GFR calc Af Amer: 33 mL/min — ABNORMAL LOW (ref >60)
GFR calc Af Amer: 32 mL/min — ABNORMAL LOW (ref >60)
GFR calc non Af Amer: 29 mL/min — ABNORMAL LOW (ref >60)
GFR calc non Af Amer: 27 mL/min — ABNORMAL LOW (ref >60)
Glucose, Bld: 104 mg/dL — ABNORMAL HIGH (ref 65–99)
Glucose, Bld: 154 mg/dL — ABNORMAL HIGH (ref 65–99)
Potassium: 3.1 mmol/L — ABNORMAL LOW (ref 3.5–5.1)
Potassium: 3.3 mmol/L — ABNORMAL LOW (ref 3.5–5.1)
Sodium: 127 mmol/L — ABNORMAL LOW (ref 135–145)
Sodium: 128 mmol/L — ABNORMAL LOW (ref 135–145)

## 2014-09-16 LAB — BLOOD GAS, ARTERIAL
Acid-base deficit: 10.4 mmol/L — ABNORMAL HIGH (ref 0.0–2.0)
Bicarbonate: 12.7 mEq/L — ABNORMAL LOW (ref 20.0–24.0)
Delivery systems: POSITIVE
Drawn by: 33100
Expiratory PAP: 6
FIO2: 1
Inspiratory PAP: 12
Mode: POSITIVE
O2 Saturation: 99.9 %
Patient temperature: 98.6
TCO2: 13.2 mmol/L (ref 0–100)
pCO2 arterial: 17.6 mmHg — CL (ref 35.0–45.0)
pH, Arterial: 7.471 — ABNORMAL HIGH (ref 7.350–7.450)
pO2, Arterial: 404 mmHg — ABNORMAL HIGH (ref 80.0–100.0)

## 2014-09-16 LAB — MRSA PCR SCREENING: MRSA by PCR: NEGATIVE

## 2014-09-16 LAB — TROPONIN I
Troponin I: 0.2 ng/mL — ABNORMAL HIGH (ref <0.031)
Troponin I: 0.24 ng/mL — ABNORMAL HIGH (ref <0.031)
Troponin I: 0.22 ng/mL — ABNORMAL HIGH (ref <0.031)

## 2014-09-16 LAB — CBC
HCT: 32.3 % — ABNORMAL LOW (ref 36.0–46.0)
Hemoglobin: 10.9 g/dL — ABNORMAL LOW (ref 12.0–15.0)
MCH: 29.5 pg (ref 26.0–34.0)
MCHC: 33.7 g/dL (ref 30.0–36.0)
MCV: 87.3 fL (ref 78.0–100.0)
Platelets: 225 10*3/uL (ref 150–400)
RBC: 3.7 MIL/uL — ABNORMAL LOW (ref 3.87–5.11)
RDW: 13.3 % (ref 11.5–15.5)
WBC: 10.4 10*3/uL (ref 4.0–10.5)

## 2014-09-16 LAB — PROCALCITONIN: Procalcitonin: 39.21 ng/mL

## 2014-09-16 LAB — LACTIC ACID, PLASMA
Lactic Acid, Venous: 4.4 mmol/L (ref 0.5–2.0)
Lactic Acid, Venous: 1.9 mmol/L (ref 0.5–2.0)
Lactic Acid, Venous: 4.2 mmol/L (ref 0.5–2.0)

## 2014-09-16 LAB — MAGNESIUM: Magnesium: 1.3 mg/dL — ABNORMAL LOW (ref 1.7–2.4)

## 2014-09-16 LAB — LIPID PANEL
Cholesterol: 89 mg/dL (ref 0–200)
HDL: 13 mg/dL — ABNORMAL LOW (ref >40)
LDL Cholesterol: 40 mg/dL (ref 0–99)
Total CHOL/HDL Ratio: 6.8 RATIO
Triglycerides: 180 mg/dL — ABNORMAL HIGH (ref <150)
VLDL: 36 mg/dL (ref 0–40)

## 2014-09-16 MED ORDER — FUROSEMIDE 10 MG/ML IJ SOLN
INTRAMUSCULAR | Status: AC
  Administered 2014-09-16: 11:00:00
  Filled 2014-09-16: qty 4

## 2014-09-16 MED ORDER — DILTIAZEM LOAD VIA INFUSION
15.0000 mg | Freq: Once | INTRAVENOUS | Status: AC
  Administered 2014-09-16: 15 mg via INTRAVENOUS

## 2014-09-16 MED ORDER — FUROSEMIDE 10 MG/ML IJ SOLN
40.0000 mg | Freq: Once | INTRAMUSCULAR | Status: AC
  Administered 2014-09-16: 40 mg via INTRAVENOUS

## 2014-09-16 MED ORDER — VANCOMYCIN HCL IN DEXTROSE 750-5 MG/150ML-% IV SOLN
750.0000 mg | INTRAVENOUS | Status: DC
  Administered 2014-09-16 – 2014-09-19 (×4): 750 mg via INTRAVENOUS
  Filled 2014-09-16 (×4): qty 150

## 2014-09-16 MED ORDER — MORPHINE SULFATE 2 MG/ML IJ SOLN: 2.0000 mg | Freq: Once | INTRAMUSCULAR | Status: DC

## 2014-09-16 MED ORDER — POTASSIUM CHLORIDE 10 MEQ/100ML IV SOLN
10.0000 meq | INTRAVENOUS | Status: AC
  Administered 2014-09-16 (×4): 10 meq via INTRAVENOUS
  Filled 2014-09-16 (×4): qty 100

## 2014-09-16 MED ORDER — SODIUM CHLORIDE 0.9 % IV SOLN
INTRAVENOUS | Status: DC
  Administered 2014-09-16 – 2014-09-17 (×3): via INTRAVENOUS

## 2014-09-16 MED ORDER — METOPROLOL TARTRATE 1 MG/ML IV SOLN
2.5000 mg | Freq: Once | INTRAVENOUS | Status: AC
  Administered 2014-09-16: 2.5 mg via INTRAVENOUS

## 2014-09-16 MED ORDER — SODIUM CHLORIDE 0.9 % IV BOLUS (SEPSIS)
500.0000 mL | Freq: Once | INTRAVENOUS | Status: AC
  Administered 2014-09-16: 500 mL via INTRAVENOUS

## 2014-09-16 MED ORDER — MORPHINE SULFATE 2 MG/ML IJ SOLN
2.0000 mg | Freq: Once | INTRAMUSCULAR | Status: AC
  Administered 2014-09-16: 2 mg via INTRAVENOUS

## 2014-09-16 MED ORDER — MAGNESIUM SULFATE 4 GM/100ML IV SOLN
4.0000 g | Freq: Once | INTRAVENOUS | Status: AC
  Administered 2014-09-16: 4 g via INTRAVENOUS
  Filled 2014-09-16: qty 100

## 2014-09-16 MED ORDER — PIPERACILLIN-TAZOBACTAM 3.375 G IVPB
3.3750 g | Freq: Three times a day (TID) | INTRAVENOUS | Status: DC
  Administered 2014-09-16 – 2014-09-18 (×7): 3.375 g via INTRAVENOUS
  Filled 2014-09-16 (×9): qty 50

## 2014-09-16 MED ORDER — POTASSIUM CHLORIDE CRYS ER 20 MEQ PO TBCR
40.0000 meq | EXTENDED_RELEASE_TABLET | Freq: Two times a day (BID) | ORAL | Status: DC
  Administered 2014-09-16: 40 meq via ORAL
  Administered 2014-09-16: 20 meq via ORAL
  Filled 2014-09-16 (×2): qty 2

## 2014-09-16 MED ORDER — METOPROLOL TARTRATE 1 MG/ML IV SOLN
INTRAVENOUS | Status: AC
  Administered 2014-09-16: 11:00:00
  Filled 2014-09-16: qty 5

## 2014-09-16 MED ORDER — POTASSIUM CHLORIDE CRYS ER 10 MEQ PO TBCR
40.0000 meq | EXTENDED_RELEASE_TABLET | Freq: Two times a day (BID) | ORAL | Status: DC
  Administered 2014-09-17 – 2014-09-18 (×3): 40 meq via ORAL
  Filled 2014-09-16 (×6): qty 4

## 2014-09-16 MED ORDER — DEXTROSE 5 % IV SOLN
5.0000 mg/h | INTRAVENOUS | Status: DC
  Administered 2014-09-16: 5 mg/h via INTRAVENOUS

## 2014-09-16 MED ORDER — SODIUM CHLORIDE 0.9 % IV SOLN
Freq: Once | INTRAVENOUS | Status: AC
  Administered 2014-09-16: 14:00:00 via INTRAVENOUS

## 2014-09-16 MED ORDER — MORPHINE SULFATE 2 MG/ML IJ SOLN
INTRAMUSCULAR | Status: AC
  Filled 2014-09-16: qty 1

## 2014-09-16 MED ORDER — DILTIAZEM HCL 100 MG IV SOLR
5.0000 mg/h | INTRAVENOUS | Status: DC
  Administered 2014-09-16: 5 mg/h via INTRAVENOUS
  Filled 2014-09-16: qty 100

## 2014-09-16 MED ORDER — SODIUM CHLORIDE 0.9 % IV SOLN
1.0000 g | Freq: Once | INTRAVENOUS | Status: AC
  Administered 2014-09-16: 1 g via INTRAVENOUS
  Filled 2014-09-16: qty 10

## 2014-09-16 NOTE — Progress Notes (Signed)
Subjective:  Significant fever 104. Resp distress now. Tachy.   Objective:  Vital Signs in the last 24 hours: Temp:  [97.5 F (36.4 C)-104.7 F (40.4 C)] 104.7 F (40.4 C) (08/05 1124) Pulse Rate:  [40-158] 157 (08/05 1127) Resp:  [18-34] 25 (08/05 1033) BP: (71-150)/(51-117) 127/77 mmHg (08/05 1127) SpO2:  [86 %-100 %] 100 % (08/05 1127) Weight:  [148 lb 13 oz (67.5 kg)-153 lb 1.6 oz (69.446 kg)] 153 lb 1.6 oz (69.446 kg) (08/05 0547)  Intake/Output from previous day: 08/04 0701 - 08/05 0700 In: 1340.8 [P.O.:120; I.V.:1220.8] Out: 400 [Urine:400]   Physical Exam: General: Well developed, in moderate resp distress. Head:  Normocephalic and atraumatic. Lungs: Decreased air movement B. Heart: Tachy irreg .  No murmur, rubs or gallops.  Abdomen: soft, non-tender, positive bowel sounds. Extremities: No clubbing or cyanosis. No edema. Neurologic: Alert and oriented x 3.    Lab Results:  Recent Labs  09/15/14 1100 09/16/14 0617  WBC 12.1* 10.4  HGB 11.8* 10.9*  PLT 269 225    Recent Labs  09/15/14 1100 09/16/14 0617  NA 128* 127*  K 3.7 3.1*  CL 96* 97*  CO2 19* 17*  GLUCOSE 88 104*  BUN 31* 23*  CREATININE 2.25* 1.60*    Recent Labs  09/15/14 2210 09/16/14 0617  TROPONINI 0.25* 0.20*   Hepatic Function Panel  Recent Labs  09/15/14 1100  PROT 6.2*  ALBUMIN 2.5*  AST 40  ALT 21  ALKPHOS 173*  BILITOT 0.9    Recent Labs  09/16/14 0617  CHOL 89   No results for input(s): PROTIME in the last 72 hours.  Imaging: Dg Chest 2 View  09/15/2014   CLINICAL DATA:  Shortness of breath. Pain when breathing for 2 days.  EXAM: CHEST  2 VIEW  COMPARISON:  03/20/2014  FINDINGS: Heart size is mildly enlarged. Moderate hiatal hernia present. There are no focal consolidations. No pleural effusions. No pulmonary edema. Mild scoliosis.  IMPRESSION: 1. Cardiomegaly without edema. 2. Hiatal hernia.   Electronically Signed   By: Nolon Nations M.D.   On:  09/15/2014 11:31   Dg Chest Port 1 View  09/16/2014   CLINICAL DATA:  Shortness of Breath  EXAM: PORTABLE CHEST - 1 VIEW  COMPARISON:  September 15, 2014  FINDINGS: There is no edema or consolidation. Heart is upper normal in size with pulmonary vascularity within normal limits. Moderate hiatal hernia is present. There is atherosclerotic change in the aorta. No adenopathy. No bone lesions.  IMPRESSION: No edema or consolidation. Moderate hiatal hernia. Atherosclerotic calcification in aorta.   Electronically Signed   By: Lowella Grip III M.D.   On: 09/16/2014 11:20   Personally viewed.   Telemetry: AFIB with RVR Personally viewed.   EKG:  AFIB with RVR  Cardiac Studies:  ECHO P  Assessment/Plan:  Active Problems:   HYPERCHOLESTEROLEMIA   HYPERTENSION, BENIGN SYSTEMIC   Pulmonary embolism and infarction   GERD   A-fib   Hyponatremia   AKI (acute kidney injury)   SOB (shortness of breath)   Elevated troponin   Chronic diastolic congestive heart failure   Elevated WBC count   Urinary tract infectious disease   Blood poisoning  81 year old with mildly elevated troponin, acute respiratory failure with hypoxia, AFIB with RVR now with high grade fever 104.  1. Fever  - Primary team has broadened ABX to Vanc Zosyn  - was being treated for UTI previously. Now with worsening respiratory  status ? PNA.   2. Acute respiratory failure with hypoxia  - BIPAP per primary team  - transfer to [place] unit  - ABX  3. Elevated troponin  - 0.2 range. Likely [place] ischemia in the setting of current illness  - can not exclude the possibility of underlying CAD. May benefit from stress test in future after over current illness.   4. Cardiomegaly on CXR  - await ECHO read  5. AFIB  - RVR with respiratory deterioration  - Eliquis  - Takes Dilt CD 136m at home  - Got IV lopressor now.   - Consider adding back PO Dilt if BP able to support.   Will follow.    Sara Russell, MGilman8/06/2014, 11:41  AM

## 2014-09-16 NOTE — Progress Notes (Signed)
RT transported with RR RN and RN with pt from [phone] to [place]. Pt was on bi-pap and stated that she could not breathe so RT switched her to NRB during transport. Pt is now on 2L [place] and states she feels good. Her sat is 96%. RT will continue to monitor.

## 2014-09-16 NOTE — Progress Notes (Signed)
Utilization Review completed. Jaevian Shean RN BSN CM 

## 2014-09-16 NOTE — Progress Notes (Signed)
Patient's HR sustaining more than 130's up to 170's. Patient with shaking and audible wheezing when entering the room. She was tachypnic and O2 sat at 86%. Placed O2, Dr.'s notified and on floor. Patient to be transferred to stepdown.

## 2014-09-16 NOTE — Care Management Note (Signed)
Case Management Note  Patient Details  Name: Sara Russell MRN: [id number] Date of Birth: 05/10/1930  Subjective/Objective:    Transferred to ccu for resp distress                Action/Plan:lives alone per chart   Expected Discharge Date:                  Expected Discharge Plan:  [name]  In-House Referral:     Discharge planning Services     Post Acute Care Choice:    Choice offered to:     DME Arranged:    DME Agency:     HH Arranged:    [place] Agency:     Status of Service:     Medicare Important Message Given:    Date Medicare IM Given:    Medicare IM give by:    Date Additional Medicare IM Given:    Additional Medicare Important Message give by:     If discussed at [name] of Stay Meetings, dates discussed:    Additional Comments: will moniter for dc needs as pt progresses.  Lacretia Leigh, RN 09/16/2014, 2:12 PM

## 2014-09-16 NOTE — Progress Notes (Signed)
Report given to [facility] [place]

## 2014-09-16 NOTE — Progress Notes (Signed)
ANTIBIOTIC CONSULT NOTE - INITIAL  Pharmacy Consult for Vancomycin, Zosyn Indication: rule out sepsis  Allergies  Allergen Reactions  . Iohexol Other (See Comments)    unknown  . Ivp Dye [Iodinated Diagnostic Agents] Hives  . Scallops [Shellfish Allergy] Other (See Comments)    Projectile vomiting.  . Sulfa Antibiotics Hives  . Sulfamethoxazole Hives    REACTION: unspecified    Patient Measurements: Height: 5' 1"  (154.9 cm) Weight: 153 lb 1.6 oz (69.446 kg) IBW/kg (Calculated) : 47.8  Vital Signs: Temp: 104.7 F (40.4 C) (08/05 1124) Temp Source: Rectal (08/05 1124) BP: 127/77 mmHg (08/05 1127) Pulse Rate: 157 (08/05 1127) Intake/Output from previous day: 08/04 0701 - 08/05 0700 In: 1340.8 [P.O.:120; I.V.:1220.8] Out: 400 [Urine:400] Intake/Output from this shift: Total I/O In: 750 [I.V.:750] Out: -   Labs:  Recent Labs  09/15/14 1100 09/16/14 0617  WBC 12.1* 10.4  HGB 11.8* 10.9*  PLT 269 225  CREATININE 2.25* 1.60*   Estimated Creatinine Clearance: 24.1 mL/min (by C-G formula based on Cr of 1.6). No results for input(s): VANCOTROUGH, VANCOPEAK, VANCORANDOM, GENTTROUGH, GENTPEAK, GENTRANDOM, TOBRATROUGH, TOBRAPEAK, TOBRARND, AMIKACINPEAK, AMIKACINTROU, AMIKACIN in the last 72 hours.   Microbiology: Recent Results (from the past 720 hour(s))  Culture, blood (routine x 2)     Status: None (Preliminary result)   Collection Time: 09/15/14  4:03 PM  Result Value Ref Range Status   Specimen Description BLOOD RIGHT ANTECUBITAL  Final   Special Requests BOTTLES DRAWN AEROBIC AND ANAEROBIC 5CC  Final   Culture PENDING  Incomplete   Report Status PENDING  Incomplete    Medical History: Past Medical History  Diagnosis Date  . S/P dilatation of esophageal stricture 2002  . Hypertension   . Hyperlipidemia   . DVT (deep venous thrombosis) 2009  . PE (pulmonary embolism) 2009  . GERD (gastroesophageal reflux disease)   . Diverticulosis   . Iron deficiency  anemia   . Hyperplastic colon polyp   . Positive H. pylori test 01/20/96  . Degenerative joint disease   . Skin cancer     h/o basal and squamous skin cancer--> removed  . Persistent atrial fibrillation   . Shortness of breath dyspnea   . AKI (acute kidney injury) 09/15/2014    Assessment: 81 year old female admitted with AKI now to start broader spectrum antibiotics for rule out sepsis  Goal of Therapy:  Vancomycin trough level 15-20 mcg/ml  Appropriate Zosyn dosing  Plan:  Vancomycin 750 mg iv Q 24 hours Zosyn 3.375 grams iv Q 8 hours - 4 hr infusion Follow up cultures, Scr  Thank you Anette Guarneri, PharmD [phone] 09/16/2014,11:33 AM

## 2014-09-16 NOTE — Progress Notes (Signed)
Attempted to call report to [facility] Nurse to call back (locating her)

## 2014-09-16 NOTE — Progress Notes (Signed)
CRITICAL VALUE ALERT  Critical value received:  Lactic acid 4.2  Date of notification:  09/16/2014  Time of notification:  2110  Critical value read back:Yes.    Nurse who received alert:  Doretha Sou  Notified MD Sood at 2119. Vicie Mutters, RN

## 2014-09-16 NOTE — Progress Notes (Signed)
Checked on patient this evening. Doing well. Sleeping comfortably. No chest pain, SOB, or dizziness.    HR stable on telemetry in the 80-90s. BPs low but stable (97/51). Sleeping comfortably on exam.  Irregularly irregular rate.  Trops trending down 0.28>0.34>0.25   Continue to monitor BPs. If BPs decrease, will consider 500cc bolus  If too low, consider holding AM metop.   Archie Patten, MD [facility] Family Medicine Resident  09/16/2014, 12:06 AM

## 2014-09-16 NOTE — Progress Notes (Signed)
  Echocardiogram 2D Echocardiogram has been performed.  Sara Russell 09/16/2014, 3:17 PM

## 2014-09-16 NOTE — Progress Notes (Signed)
    Tried low dose diltiazem IV for rate control of AFIB. Unable to use currently with concomitant hypotension ? develolpement of vasodilatory shock CCM is getting involved, Dr. Roby Lofts back 258m IV bolus of fluids May need phenylephrine.   SCandee Furbish MD

## 2014-09-16 NOTE — Progress Notes (Signed)
Family Medicine Teaching Service Daily Progress Note Intern Pager: [phone]  Patient name: Sara Russell Medical record number: [id number] Date of birth: [birth year] Age: 81 y.o. Gender: female  Primary Care Provider: Zigmund Gottron, MD Consultants: Cardiology  Code Status: Partial, DNI   Pt Overview and Major Events to Date:    Assessment and Plan:  Sara Russell is a 81 y.o. female presenting with SOB, fatigue and dizziness. PMH is significant for afib, PE (currently on eliquis), HTN, HLD, diverticulosis, GERD, esophageal stricture s/p dilatation, anemia.  # Hypoxic/SOB: Patient at 10:30 AM in respiratory distress.Patient was sitting up in bed tripoding. Pulse oximetry was 86%. Patient was put on nonbreather, and then was transferred to BiPAP with sats improving to 89%.  HR 157 and RR 25. Patient CXR was negative for consolidation and pulmonary edema, with cardiomegaly (no past hx of CHF). Patient with a respiratory alkalosis  (ph 7.47 pCO2 17.6, bicarb 62.8) and metabolic acidosis. Differential includes infectious (Temp 104.7) other less likely causes cardiac, pulmonary  - ECHO in the morning  - Morning EKG showing A-fib  - Troponin 0.34>0.25>0.20> 0.24 -- will continue to trend.  - Cardiology recs  - O2 prn, monitor respiratory status - telemetry monitoring  # Sepsis 2/2 UTI: UA positive for moderate leukocytes, however without dysuria. Meets SIRS criteria with tachypnea and WBC 12.1. Temp 104.7  - WBC 10.4 this AM, patient  - Lactic Acid 4.4 < 1.1 yesterday  - Urine Culture, blood cultures pending - D/C Ceftriaxone 1g q24h, broadened Antibiotics to vancomycin and Zosyn  - Continue to monitor fever curve  # Hypoxic. Patient in respiratory distress at 10:30 AM this morning. Patient Sp02 was   # AKI. Patient denies decreased fluid intake. However patient with moist mucous membranes. SCr 2.25. (baseline 0.6-1)  -  SCr 1.68 this AM improving  - Will continue morning  BMP  - Stopped maintenance fluids as patient was hypertensive and hypoxic   # Chronic Atrial Fibrillation. Currently in A-fib with RVR on EKG. Received a  single dose of Lasix 40 mg and  Lopressor 2.5 mg. CXR with cardiomegaly on 8/4.  - Per cards Diltiazem drip was started  - Continue Lopressor to 82m BID (home dose 10754mBID)  - Continue home Eliquis 54m20mID   #  Hyponatremia - On admission Na 128, this AM Na 127. Also slightly hypokalemic this AM at K 3.1  - Will continue to monitor   # Mild Normocytic Anemia. Hx of Anemia. Normal range hgb 12.0-14. Hgb 11.8/Hct 35.3 on admission - Hgb/Hct 10.9/32.3  - Continue to monitor   # Hx of PE/DVT: On Eliquis and reports compliance, doubt recurrent clot as cause of current symptoms - Continue home Eliquis  # Labile Blood pressures: BP in 80-31-51Vstolic. Overnight BP 97/51. However this AM BP 134/74  - Decrease Lopressor to 254m64mD  - Holding Lotensin, Diltiazem  # HLD: Last lipid panel in 07/2013 with LDL 30 - Continue home atorvastatin - Repeat FLP in AM  FEN/GI: - Heart Diet  - home PPI  - Holding NS @ 1254mL40ms hypertensive   Prophylaxis: on home Eliquis   Disposition: Telemetry, Dispo to Home   Subjective:   8: 30 AM - Patient doing well, overall feels better. Patient has a good appetite. Joking around. Denies any complaints of SOB, chest pain, dysuria. When I mentioned her heart rate being slightly elevated, she said this hospital was not the most peaceful place.   10  AM - Was called by the Nurse back to the room. Patient was tripoding, on a non-rebreathe, respirations were in the 60s, with rigors throughout.  Patient was tachycardic at 170 and pulse oximetry was 89%. Patient was transferred to BiPAP, and respirations were better. Patient looked more comfortable.   Objective: Temp:  [97.5 F (36.4 C)-104.7 F (40.4 C)] 104.7 F (40.4 C) (08/05 1124) Pulse Rate:  [40-158] 157 (08/05 1127) Resp:  [18-34] 25 (08/05  1033) BP: (71-150)/(51-117) 127/77 mmHg (08/05 1127) SpO2:  [86 %-100 %] 100 % (08/05 1127) Weight:  [148 lb 13 oz (67.5 kg)-153 lb 1.6 oz (69.446 kg)] 153 lb 1.6 oz (69.446 kg) (08/05 0547) Physical Exam: General: Patient sitting up in bed, NAD  Cardiovascular: Irregular Rate and rhythm, Radial pulse 2+ Respiratory: CTAB - slightly pleural rub on the left side   Abdomen: BS+, NTND Extremities: No lower extremity edema noted, 2+ D - Patient legs were mottled and cool to touch  Laboratory:  Recent Labs Lab 09/15/14 1100 09/16/14 0617  WBC 12.1* 10.4  HGB 11.8* 10.9*  HCT 35.3* 32.3*  PLT 269 225    Recent Labs Lab 09/15/14 1100 09/16/14 0617 09/16/14 1104  NA 128* 127* 128*  K 3.7 3.1* 3.3*  CL 96* 97* 96*  CO2 19* 17* 16*  BUN 31* 23* 23*  CREATININE 2.25* 1.60* 1.68*  CALCIUM 8.2* 7.3* 7.5*  PROT 6.2*  --   --   BILITOT 0.9  --   --   ALKPHOS 173*  --   --   ALT 21  --   --   AST 40  --   --   GLUCOSE 88 104* 154*      Recent Labs Lab 09/15/14 1100 09/15/14 1605 09/15/14 2210 09/16/14 0617 09/16/14 1104  TROPONINI 0.28* 0.34* 0.25* 0.20* 0.24*   Lactic Acid  Results for ALLYSHA, TRYON (MRN [id number]) as of 09/16/2014 12:20  Ref. Range 09/15/2014 18:10 09/16/2014 11:04  Lactic Acid, Venous Latest Ref Range: 0.5-2.0 mmol/L 1.1 4.4 (HH)   Blood Cultures Blood Cultures 8/4 - NGTD  Blood Cultures 8/5 - Pending  Urine Culture 8/4 - NGTD   Imaging/Diagnostic Tests: Dg Chest 2 View  09/15/2014   CLINICAL DATA:  Shortness of breath. Pain when breathing for 2 days.  EXAM: CHEST  2 VIEW  COMPARISON:  03/20/2014  FINDINGS: Heart size is mildly enlarged. Moderate hiatal hernia present. There are no focal consolidations. No pleural effusions. No pulmonary edema. Mild scoliosis.  IMPRESSION: 1. Cardiomegaly without edema. 2. Hiatal hernia.   Electronically Signed   By: Nolon Nations M.D.   On: 09/15/2014 11:31   Dg Chest Port 1 View  09/16/2014   CLINICAL DATA:   Shortness of Breath  EXAM: PORTABLE CHEST - 1 VIEW  COMPARISON:  September 15, 2014  FINDINGS: There is no edema or consolidation. Heart is upper normal in size with pulmonary vascularity within normal limits. Moderate hiatal hernia is present. There is atherosclerotic change in the aorta. No adenopathy. No bone lesions.  IMPRESSION: No edema or consolidation. Moderate hiatal hernia. Atherosclerotic calcification in aorta.   Electronically Signed   By: Lowella Grip III M.D.   On: 09/16/2014 11:20   Yeslin Delio Cletis Media, MD 09/16/2014, 12:21 PM PGY-1, [place] Intern pager: [phone], text pages welcome

## 2014-09-16 NOTE — Progress Notes (Signed)
Arrived, pt was on NRB, Spo2 reading 89%, pt very tachypneic, called Rapid Response, MD's at bedside ordered BIPAP.

## 2014-09-16 NOTE — Progress Notes (Signed)
[place] Progress Note Patient Name: Sara Russell DOB: 1931-02-02 MRN: [id number]   Date of Service  09/16/2014  HPI/Events of Note    eICU Interventions  hypomag -repleted     Intervention Category Intermediate Interventions: Electrolyte abnormality - evaluation and management  ALVA,RAKESH V. 09/16/2014, 7:15 PM

## 2014-09-16 NOTE — Significant Event (Signed)
Rapid Response Event Note  Overview: Called to assist with patient with resp distress Time Called: [id number] Arrival Time: 1046 Event Type: Respiratory, Cardiac  Initial Focused Assessment:  On arrival patient supine in bed - alert - resps rapid and labored rate about 52 per minute - able to speak few words - nods to questions - bil BS with decreased air movement - pleural rub noted left side - few crackles noted LLL - torso hot to touch - legs very mottled - having some rigors - c/o chills -   denies CP - afib rate 158 - BP 150/117 O2 sats 85%. No JVD no pedal edema.  Residents at bedside.    Interventions:  Stat PCXR and ABG done.  Placed on Bipap per RT. Labs drawn to include LA.    Rectal temp done - 104.7.  Tylenol suppository given  - Resident MD's aware. Had received 2.5 mg Metoprolol prior to arrival.   Decreased WOB with Bipap. Few fine crackles noted bases -- pleural rub noted left side.  Patient able to speak full sentences.  Feels better with Bipap.  Dr. Marlou Porch at bedside.  Denies CP.  HR remains afib with RVR.  BP 134/74 HR 150 RR 24 O2 sats 100% .  ABG results noted - MD at bedside and aware.  40 mg Lasix IV given. Oral care given.  Continue to deny pain. Mottling of legs resolving.  127/77 157 RR 20 with Bipap O2 sats 100%.  Transferred to [place] - in elevator with transfer - patient became agitated with Bipap mask - removed and placed on NRB mask - tol well.  Arrived to [place] - much more alert - RR 20 and regular without use of accessory - full sentence speaking.  Placed on 2 liter nasal cannula - RN Tanya to watch.  Handoff to [employer].     Event Summary: Name of Physician Notified: Dr. Gwendlyn Deutscher at  (pta RRT)    at    Outcome: Transferred (Comment) [phone])  Event End Time: 1202  Quin Hoop

## 2014-09-16 NOTE — Consult Note (Signed)
PULMONARY / CRITICAL CARE MEDICINE   Name: Sara Russell MRN: [id number] DOB: [birth year]    ADMISSION DATE:  09/15/2014 CONSULTATION DATE:  09/16/14  REFERRING MD :  Family Medicine Service  CHIEF COMPLAINT:  Hypotension  INITIAL PRESENTATION: Presented with 1 day history of shortness of breath on exertion. Known history of atrial fibrillation on systemic and coagulation with Eliquis. Also has a known history of PE/DVT in 2009. Denies any infectious symptoms or complaints other than dyspnea.  STUDIES:  TTE (8/4) - pending Portable CXR (8/5) - No effusion or opacity. Hiatal hernia noted.  SIGNIFICANT EVENTS: 8/4 - admit 8/5 - transfer to icu w/ respiratory failure & a fib w/ rvr   HISTORY OF PRESENT ILLNESS:  Patient reports she was in her usual state of health until Sunday/Monday when she began to experience sudden onset of dyspnea. Patient endorses sweats but denies any subjective fever or chills. Denies any cough with her dyspnea. Denies any palpitations, chest pressure, or chest pain. She denies any dysuria or hematuria. She denies any abdominal pain, nausea, or vomiting. She denies any sore throat or sinus congestion. She reports medical compliance with her home regimen. On admission the patient reported she was more dyspneic with exertion. She has been febrile since admission with only a mildly elevated leukocyte count. Of note the patient does have a rising lactic acidosis. Broad-spectrum antibiotics have been started on the patient with possible source of urine for her sepsis. Cultures are pending. Currently her rate is controlled off of diltiazem drip. However, her blood pressure is somewhat tenuous. Patient had acute worsening of hypoxic respiratory failure necessitating transfer to the intensive care unit today.  PAST MEDICAL HISTORY :   has a past medical history of S/P dilatation of esophageal stricture (2002); Hypertension; Hyperlipidemia; DVT (deep venous thrombosis) (2009); PE  (pulmonary embolism) (2009); GERD (gastroesophageal reflux disease); Diverticulosis; Iron deficiency anemia; Hyperplastic colon polyp; Positive H. pylori test (01/20/96); Degenerative joint disease; Skin cancer; Persistent atrial fibrillation; Shortness of breath dyspnea; and AKI (acute kidney injury) (09/15/2014).  has past surgical history that includes Dilation and curettage of uterus; Replacement total knee; Tonsillectomy; Appendectomy; and Breast lumpectomy. Prior to Admission medications   Medication Sig Start Date End Date Taking? Authorizing Provider  acetaminophen (TYLENOL) 650 MG CR tablet Take 650 mg by mouth every 8 (eight) hours as needed for pain.   Yes Historical Provider, MD  atorvastatin (LIPITOR) 10 MG tablet TAKE ONE TABLET BY MOUTH ONCE DAILY AT  6PM 08/29/14  Yes Zenia Resides, MD  benazepril (LOTENSIN) 40 MG tablet TAKE ONE TABLET BY MOUTH ONCE DAILY 06/24/14  Yes Zenia Resides, MD  calcium carbonate (TUMS - DOSED IN MG ELEMENTAL CALCIUM) 500 MG chewable tablet Chew 1 tablet by mouth 2 (two) times daily.     Yes Historical Provider, MD  calcium-vitamin D (SM CALCIUM-VITAMIN D) 500-200 MG-UNIT per tablet Take 1 tablet by mouth 2 (two) times daily.    Yes Historical Provider, MD  diltiazem (CARDIZEM CD) 180 MG 24 hr capsule Take 1 capsule (180 mg total) by mouth daily. 12/03/13  Yes Zenia Resides, MD  ELIQUIS 5 MG TABS tablet TAKE 1 TABLET TWICE A DAY 07/21/14  Yes Thompson Grayer, MD  ferrous sulfate 325 (65 FE) MG tablet Take 1 tablet (325 mg total) by mouth daily with breakfast. 06/04/13  Yes Zenia Resides, MD  metoprolol (LOPRESSOR) 100 MG tablet TAKE ONE TABLET BY MOUTH TWICE DAILY 01/04/14  Yes Zenia Resides,  MD  omeprazole (PRILOSEC) 40 MG capsule TAKE ONE CAPSULE BY MOUTH ONCE DAILY 06/14/14  Yes Zenia Resides, MD  traMADol (ULTRAM) 50 MG tablet TAKE ONE TABLET BY MOUTH EVERY 6 HOURS AS NEEDED FOR PAIN 08/29/14  Yes Zenia Resides, MD   Allergies  Allergen  Reactions  . Iohexol Other (See Comments)    unknown  . Ivp Dye [Iodinated Diagnostic Agents] Hives  . Scallops [Shellfish Allergy] Other (See Comments)    Projectile vomiting.  . Sulfa Antibiotics Hives  . Sulfamethoxazole Hives    REACTION: unspecified    FAMILY HISTORY:  indicated that her mother is deceased. She indicated that her father is deceased. She indicated that only one of her two brothers is alive.  SOCIAL HISTORY:  reports that she quit smoking about 53 years ago. Her smoking use included Cigarettes. She started smoking about 66 years ago. She has a 6.5 pack-year smoking history. She has never used smokeless tobacco. She reports that she drinks alcohol. She reports that she does not use illicit drugs.  REVIEW OF SYSTEMS:  Denies any rashes or abnormal bruising. Denies any headache or vision changes. A pertinent 14 point review of systems is negative except as per the history of presenting illness.  SUBJECTIVE:   VITAL SIGNS: Temp:  [97.5 F (36.4 C)-104.7 F (40.4 C)] 104.7 F (40.4 C) (08/05 1124) Pulse Rate:  [40-158] 109 (08/05 1315) Resp:  [25-37] 37 (08/05 1315) BP: (71-150)/(42-117) 80/42 mmHg (08/05 1315) SpO2:  [86 %-100 %] 95 % (08/05 1315) Weight:  [148 lb 13 oz (67.5 kg)-153 lb 1.6 oz (69.446 kg)] 150 lb 2.1 oz (68.1 kg) (08/05 1215) HEMODYNAMICS:   VENTILATOR SETTINGS:   INTAKE / OUTPUT:  Intake/Output Summary (Last 24 hours) at 09/16/14 1437 Last data filed at 09/16/14 1304  Gross per 24 hour  Intake 2241.58 ml  Output    400 ml  Net 1841.58 ml    PHYSICAL EXAMINATION: General:  Awake. Alert. No acute distress. Sitting watching TV. Son at bedside.  Integument:  Moist and diaphoretic. No rash on exposed skin. No bruising. Lymphatics:  No appreciated cervical or supraclavicular lymphadenoapthy. HEENT:  Moist mucus membranes. No oral ulcers. No scleral injection or icterus. PERRL. Cardiovascular:  Regular rate. Irregular rhythm. No edema. No  appreciable JVD.  Pulmonary:  Good aeration & clear to auscultation bilaterally. Symmetric chest wall expansion. No accessory muscle use on nasal cannula oxygen. Abdomen: Soft. Normal bowel sounds. Nondistended. Grossly nontender. Musculoskeletal:  Normal bulk and tone. Hand grip strength 5/5 bilaterally. No joint deformity or effusion appreciated. Neurological:  CN 2-12 grossly in tact. No meningismus. Moving all 4 extremities equally. Symmetric patellar deep tendon reflexes. Psychiatric:  Mood and affect congruent. Speech normal rhythm, rate & tone.   LABS:  CBC  Recent Labs Lab 09/15/14 1100 09/16/14 0617  WBC 12.1* 10.4  HGB 11.8* 10.9*  HCT 35.3* 32.3*  PLT 269 225   Coag's No results for input(s): APTT, INR in the last 168 hours. BMET  Recent Labs Lab 09/15/14 1100 09/16/14 0617 09/16/14 1104  NA 128* 127* 128*  K 3.7 3.1* 3.3*  CL 96* 97* 96*  CO2 19* 17* 16*  BUN 31* 23* 23*  CREATININE 2.25* 1.60* 1.68*  GLUCOSE 88 104* 154*   Electrolytes  Recent Labs Lab 09/15/14 1100 09/16/14 0617 09/16/14 1104  CALCIUM 8.2* 7.3* 7.5*   Sepsis Markers  Recent Labs Lab 09/15/14 1810 09/16/14 1104  LATICACIDVEN 1.1 4.4*   ABG  Recent Labs Lab 09/16/14 1100  PHART 7.471*  PCO2ART 17.6*  PO2ART 404*   Liver Enzymes  Recent Labs Lab 09/15/14 1100  AST 40  ALT 21  ALKPHOS 173*  BILITOT 0.9  ALBUMIN 2.5*   Cardiac Enzymes  Recent Labs Lab 09/15/14 2210 09/16/14 0617 09/16/14 1104  TROPONINI 0.25* 0.20* 0.24*   Glucose No results for input(s): GLUCAP in the last 168 hours.  Imaging Dg Chest Port 1 View  09/16/2014   CLINICAL DATA:  Shortness of Breath  EXAM: PORTABLE CHEST - 1 VIEW  COMPARISON:  September 15, 2014  FINDINGS: There is no edema or consolidation. Heart is upper normal in size with pulmonary vascularity within normal limits. Moderate hiatal hernia is present. There is atherosclerotic change in the aorta. No adenopathy. No bone  lesions.  IMPRESSION: No edema or consolidation. Moderate hiatal hernia. Atherosclerotic calcification in aorta.   Electronically Signed   By: Lowella Grip III M.D.   On: 09/16/2014 11:20     ASSESSMENT / PLAN:  PULMONARY A:  Acute hypoxic respiratory failure  P:   Wean FiO2 for saturation greater than 92% Attempt to avoid volume overload with IV fluid  CARDIOVASCULAR A:  A. fib with RVR Elevated troponin I Shock H/O HTN  H/O HLD  P:  Cardiology following Continue telemetry monitoring  Awaiting transthoracic echocardiogram Holding systemic anticoagulation at this time given potential need for placement of central venous catheter Lopressor twice a day Holding diltiazem drip Normal saline IV fluid bolus 250 mL Continue Lipitor  RENAL A:   Acute on chronic renal failure Hypokalemia - replacing IV & PO Hyponatremia  P:   Trending electrolytes daily with a.m. Labs Administering IV calcium gluconate 1 g Giving oral & IV potassium chloride Checking serum magnesium level Monitor urine output & daily BUN/creatinine  GASTROINTESTINAL A:   No acute issues.  P:   Protonix PO daily  HEMATOLOGIC A:   Leukocytosis - resolved Mild Anemia - no signs of active bleeding H/O PE/DVT - 2009  P:  Monitor hemoglobin daily with CBC Holding Eliquis  INFECTIOUS A:   Possible UTI  P:   Checking serum Procalcitonin per algorithm  BCx2 8/4>> UC 8/4>>  Abx:  Vancomycin, start date 8/5, day 1/x Zosyn, start date 8/5, day 1/x  ENDOCRINE A:   No acute issues   P:   Monitor BG on daily labs  NEUROLOGIC A:   No acute issues  P:   Monitor mental status closely   FAMILY  - Updates: Son at bedside & updated regarding tenuous status.  - Inter-disciplinary family meet or Palliative Care meeting due by:  8/12    TODAY'S SUMMARY: 81 year old female with atrial fibrillation with RVR, acute on chronic renal failure, & sepsis. Worsening lactic acidosis  certainly concerning. Broad-spectrum antibiotics have been started and will be continued. Getting IV calcium gluconate to prevent any further hypotension and case diltiazem IV as necessary. Also replacing potassium chloride IV and oral. Checking stat magnesium to determine if replacement is necessary. Close monitoring. Did discuss CODE STATUS with the patient and her son present. She is going to further consider whether or not she wishes to reverse her CODE STATUS to full code and will let us know her wishes.  I have spent a total of 35 minutes of critical care time today caring for this patient, discussing the plan of care with the patient and her son, & reviewing the patient's electronic medical record.   Sonia Baller Ashok Cordia, M.D.  [facility] Pager:  [phone] After 3pm or if no response, call [phone] 09/16/2014, 2:37 PM

## 2014-09-16 NOTE — Progress Notes (Signed)
eLink Physician-Brief Progress Note Patient Name: Sara Russell DOB: 1930/12/26 MRN: [id number]   Date of Service  09/16/2014  HPI/Events of Note  Pt with UTI, sepsis.  Has A fib with RVR.  Was on cardizem, but held due to low BP.  BP improved.   eICU Interventions  Will give 15 mg bolus cardizem, and restart cardizem infusion.      Intervention Category Major Interventions: Other:  Evelen Vazguez 09/16/2014, 9:33 PM

## 2014-09-16 NOTE — Progress Notes (Signed)
eLink Physician-Brief Progress Note Patient Name: Sara Russell DOB: 1930/04/09 MRN: [id number]   Date of Service  09/16/2014  HPI/Events of Note  Pt c/o dyspnea.  She is DNI.  eICU Interventions  Will try her on BiPAP.     Intervention Category Intermediate Interventions: Other:  Ahrianna Siglin 09/16/2014, 9:43 PM

## 2014-09-17 ENCOUNTER — Inpatient Hospital Stay (HOSPITAL_COMMUNITY): Payer: Medicare Other

## 2014-09-17 DIAGNOSIS — R6521 Severe sepsis with septic shock: Secondary | ICD-10-CM

## 2014-09-17 DIAGNOSIS — A419 Sepsis, unspecified organism: Secondary | ICD-10-CM | POA: Insufficient documentation

## 2014-09-17 LAB — RENAL FUNCTION PANEL
Albumin: 1.8 g/dL — ABNORMAL LOW (ref 3.5–5.0)
Anion gap: 13 (ref 5–15)
BUN: 26 mg/dL — ABNORMAL HIGH (ref 6–20)
CO2: 15 mmol/L — ABNORMAL LOW (ref 22–32)
Calcium: 7.4 mg/dL — ABNORMAL LOW (ref 8.9–10.3)
Chloride: 101 mmol/L (ref 101–111)
Creatinine, Ser: 1.76 mg/dL — ABNORMAL HIGH (ref 0.44–1.00)
GFR calc Af Amer: 30 mL/min — ABNORMAL LOW (ref >60)
GFR calc non Af Amer: 26 mL/min — ABNORMAL LOW (ref >60)
Glucose, Bld: 214 mg/dL — ABNORMAL HIGH (ref 65–99)
Phosphorus: 1.5 mg/dL — ABNORMAL LOW (ref 2.5–4.6)
Potassium: 4.3 mmol/L (ref 3.5–5.1)
Sodium: 129 mmol/L — ABNORMAL LOW (ref 135–145)

## 2014-09-17 LAB — POCT I-STAT 3, ART BLOOD GAS (G3+)
Acid-base deficit: 10 mmol/L — ABNORMAL HIGH (ref 0.0–2.0)
Bicarbonate: 13.3 mEq/L — ABNORMAL LOW (ref 20.0–24.0)
O2 Saturation: 100 %
Patient temperature: 98.6
TCO2: 14 mmol/L (ref 0–100)
pCO2 arterial: 22.5 mmHg — ABNORMAL LOW (ref 35.0–45.0)
pH, Arterial: 7.381 (ref 7.350–7.450)
pO2, Arterial: 202 mmHg — ABNORMAL HIGH (ref 80.0–100.0)

## 2014-09-17 LAB — CBC WITH DIFFERENTIAL/PLATELET
Basophils Absolute: 0 10*3/uL (ref 0.0–0.1)
Basophils Relative: 0 % (ref 0–1)
Eosinophils Absolute: 0 10*3/uL (ref 0.0–0.7)
Eosinophils Relative: 0 % (ref 0–5)
HCT: 30.7 % — ABNORMAL LOW (ref 36.0–46.0)
Hemoglobin: 10.3 g/dL — ABNORMAL LOW (ref 12.0–15.0)
Lymphocytes Relative: 1 % — ABNORMAL LOW (ref 12–46)
Lymphs Abs: 0.1 10*3/uL — ABNORMAL LOW (ref 0.7–4.0)
MCH: 29.7 pg (ref 26.0–34.0)
MCHC: 33.6 g/dL (ref 30.0–36.0)
MCV: 88.5 fL (ref 78.0–100.0)
Monocytes Absolute: 0.4 10*3/uL (ref 0.1–1.0)
Monocytes Relative: 3 % (ref 3–12)
Neutro Abs: 13.9 10*3/uL — ABNORMAL HIGH (ref 1.7–7.7)
Neutrophils Relative %: 96 % — ABNORMAL HIGH (ref 43–77)
Platelets: 151 10*3/uL (ref 150–400)
RBC: 3.47 MIL/uL — ABNORMAL LOW (ref 3.87–5.11)
RDW: 13.5 % (ref 11.5–15.5)
WBC: 14.4 10*3/uL — ABNORMAL HIGH (ref 4.0–10.5)

## 2014-09-17 LAB — PROCALCITONIN: Procalcitonin: 63.54 ng/mL

## 2014-09-17 LAB — URINE CULTURE

## 2014-09-17 LAB — TROPONIN I: Troponin I: 0.17 ng/mL — ABNORMAL HIGH (ref <0.031)

## 2014-09-17 LAB — MAGNESIUM: Magnesium: 2.5 mg/dL — ABNORMAL HIGH (ref 1.7–2.4)

## 2014-09-17 LAB — LACTIC ACID, PLASMA: Lactic Acid, Venous: 3.6 mmol/L (ref 0.5–2.0)

## 2014-09-17 MED ORDER — DEXTROSE 5 % IV SOLN
0.0000 ug/min | INTRAVENOUS | Status: DC
  Administered 2014-09-17: 70 ug/min via INTRAVENOUS
  Administered 2014-09-18: 60 ug/min via INTRAVENOUS
  Administered 2014-09-18: 100 ug/min via INTRAVENOUS
  Administered 2014-09-19: 50 ug/min via INTRAVENOUS
  Administered 2014-09-19 – 2014-09-20 (×2): 100 ug/min via INTRAVENOUS
  Administered 2014-09-20: 80 ug/min via INTRAVENOUS
  Administered 2014-09-21: 20 ug/min via INTRAVENOUS
  Filled 2014-09-17 (×10): qty 4

## 2014-09-17 MED ORDER — METOPROLOL TARTRATE 1 MG/ML IV SOLN
5.0000 mg | Freq: Once | INTRAVENOUS | Status: AC
  Administered 2014-09-17: 5 mg via INTRAVENOUS

## 2014-09-17 MED ORDER — MORPHINE SULFATE 2 MG/ML IJ SOLN
INTRAMUSCULAR | Status: AC
  Filled 2014-09-17: qty 1

## 2014-09-17 MED ORDER — DEXTROSE 5 % IV SOLN
30.0000 ug/min | INTRAVENOUS | Status: DC
  Administered 2014-09-17: 40 ug/min via INTRAVENOUS
  Administered 2014-09-17: 30 ug/min via INTRAVENOUS
  Administered 2014-09-17: 40 ug/min via INTRAVENOUS
  Filled 2014-09-17 (×4): qty 1

## 2014-09-17 MED ORDER — AMIODARONE HCL IN DEXTROSE 360-4.14 MG/200ML-% IV SOLN
INTRAVENOUS | Status: AC
  Filled 2014-09-17: qty 200

## 2014-09-17 MED ORDER — DILTIAZEM HCL 100 MG IV SOLR
2.5000 mg/h | Freq: Once | INTRAVENOUS | Status: DC
  Filled 2014-09-17: qty 100

## 2014-09-17 MED ORDER — MORPHINE SULFATE 4 MG/ML IJ SOLN
4.0000 mg | Freq: Once | INTRAMUSCULAR | Status: AC
  Administered 2014-09-17: 4 mg via INTRAVENOUS

## 2014-09-17 MED ORDER — METOPROLOL TARTRATE 1 MG/ML IV SOLN
5.0000 mg | Freq: Once | INTRAVENOUS | Status: AC
  Administered 2014-09-17: 5 mg via INTRAVENOUS
  Filled 2014-09-17: qty 5

## 2014-09-17 MED ORDER — MORPHINE SULFATE 2 MG/ML IJ SOLN
2.0000 mg | Freq: Once | INTRAMUSCULAR | Status: AC
  Administered 2014-09-17: 2 mg via INTRAVENOUS

## 2014-09-17 MED ORDER — FUROSEMIDE 10 MG/ML IJ SOLN
INTRAMUSCULAR | Status: AC
  Filled 2014-09-17: qty 4

## 2014-09-17 MED ORDER — MORPHINE SULFATE 4 MG/ML IJ SOLN
4.0000 mg | Freq: Once | INTRAMUSCULAR | Status: AC
  Filled 2014-09-17: qty 1

## 2014-09-17 MED ORDER — NALOXONE HCL 0.4 MG/ML IJ SOLN: 0.4000 mg | INTRAMUSCULAR | Status: DC | PRN

## 2014-09-17 MED ORDER — AMIODARONE LOAD VIA INFUSION
150.0000 mg | Freq: Once | INTRAVENOUS | Status: AC
  Administered 2014-09-17: 150 mg via INTRAVENOUS
  Filled 2014-09-17: qty 83.34

## 2014-09-17 MED ORDER — METOPROLOL TARTRATE 1 MG/ML IV SOLN
INTRAVENOUS | Status: AC
Start: 2014-09-17 — End: 2014-09-17
  Filled 2014-09-17: qty 5

## 2014-09-17 MED ORDER — LEVALBUTEROL HCL 1.25 MG/0.5ML IN NEBU
INHALATION_SOLUTION | RESPIRATORY_TRACT | Status: AC
  Administered 2014-09-17: 1.63 mg via RESPIRATORY_TRACT
  Filled 2014-09-17: qty 0.5

## 2014-09-17 MED ORDER — AMIODARONE HCL IN DEXTROSE 360-4.14 MG/200ML-% IV SOLN
60.0000 mg/h | INTRAVENOUS | Status: DC
  Administered 2014-09-17 (×2): 60 mg/h via INTRAVENOUS
  Filled 2014-09-17: qty 200

## 2014-09-17 MED ORDER — MORPHINE SULFATE 2 MG/ML IJ SOLN: 1.0000 mg | Freq: Once | INTRAMUSCULAR | Status: DC

## 2014-09-17 MED ORDER — LORAZEPAM 2 MG/ML IJ SOLN
0.5000 mg | Freq: Once | INTRAMUSCULAR | Status: AC
  Administered 2014-09-17: 0.5 mg via INTRAVENOUS

## 2014-09-17 MED ORDER — NALOXONE HCL 0.4 MG/ML IJ SOLN
INTRAMUSCULAR | Status: AC
  Administered 2014-09-17: 0.4 mg
  Filled 2014-09-17: qty 1

## 2014-09-17 MED ORDER — CETYLPYRIDINIUM CHLORIDE 0.05 % MT LIQD: 7.0000 mL | Freq: Two times a day (BID) | OROMUCOSAL | Status: DC

## 2014-09-17 MED ORDER — FUROSEMIDE 10 MG/ML IJ SOLN
INTRAMUSCULAR | Status: AC
  Administered 2014-09-17: 40 mg
  Filled 2014-09-17: qty 4

## 2014-09-17 MED ORDER — CHLORHEXIDINE GLUCONATE 0.12% ORAL RINSE (MEDLINE KIT)
15.0000 mL | Freq: Two times a day (BID) | OROMUCOSAL | Status: DC
  Administered 2014-09-18: 15 mL via OROMUCOSAL

## 2014-09-17 MED ORDER — SODIUM CHLORIDE 0.9 % IV SOLN
INTRAVENOUS | Status: DC
  Administered 2014-09-17: 500 mL via INTRAVENOUS

## 2014-09-17 MED ORDER — MORPHINE SULFATE 4 MG/ML IJ SOLN
INTRAMUSCULAR | Status: AC
  Administered 2014-09-17: 4 mg via INTRAVENOUS
  Filled 2014-09-17: qty 1

## 2014-09-17 MED ORDER — DIGOXIN 0.25 MG/ML IJ SOLN
0.2500 mg | INTRAMUSCULAR | Status: AC
  Administered 2014-09-17: 0.25 mg via INTRAVENOUS
  Filled 2014-09-17: qty 1

## 2014-09-17 MED ORDER — FUROSEMIDE 10 MG/ML IJ SOLN
40.0000 mg | Freq: Once | INTRAMUSCULAR | Status: AC
  Administered 2014-09-17: 40 mg via INTRAVENOUS

## 2014-09-17 MED ORDER — LORAZEPAM 2 MG/ML IJ SOLN
0.5000 mg | Freq: Four times a day (QID) | INTRAMUSCULAR | Status: DC | PRN
  Administered 2014-09-17 – 2014-09-18 (×2): 0.5 mg via INTRAVENOUS
  Filled 2014-09-17 (×3): qty 1

## 2014-09-17 MED ORDER — BARIUM SULFATE 2.1 % PO SUSP: 450.0000 mL | ORAL | Status: AC

## 2014-09-17 MED ORDER — SODIUM CHLORIDE 0.9 % IV BOLUS (SEPSIS)
500.0000 mL | Freq: Once | INTRAVENOUS | Status: AC
  Administered 2014-09-17: 500 mL via INTRAVENOUS

## 2014-09-17 MED ORDER — CETYLPYRIDINIUM CHLORIDE 0.05 % MT LIQD
7.0000 mL | Freq: Two times a day (BID) | OROMUCOSAL | Status: DC
  Administered 2014-09-17: 7 mL via OROMUCOSAL

## 2014-09-17 MED ORDER — AMIODARONE HCL IN DEXTROSE 360-4.14 MG/200ML-% IV SOLN
30.0000 mg/h | INTRAVENOUS | Status: DC
  Administered 2014-09-18 – 2014-09-20 (×6): 30 mg/h via INTRAVENOUS
  Filled 2014-09-17 (×16): qty 200

## 2014-09-17 MED ORDER — CHLORHEXIDINE GLUCONATE 0.12% ORAL RINSE (MEDLINE KIT): 15.0000 mL | Freq: Two times a day (BID) | OROMUCOSAL | Status: DC

## 2014-09-17 MED ORDER — METOPROLOL TARTRATE 1 MG/ML IV SOLN: 5.0000 mg | Freq: Once | INTRAVENOUS | Status: DC

## 2014-09-17 MED ORDER — MORPHINE SULFATE 2 MG/ML IJ SOLN
2.0000 mg | INTRAMUSCULAR | Status: DC | PRN
  Administered 2014-09-17: 4 mg via INTRAVENOUS
  Administered 2014-09-18 (×2): 2 mg via INTRAVENOUS
  Filled 2014-09-17 (×2): qty 1

## 2014-09-17 MED ORDER — METOPROLOL TARTRATE 1 MG/ML IV SOLN
INTRAVENOUS | Status: AC
  Filled 2014-09-17: qty 5

## 2014-09-17 MED ORDER — DILTIAZEM HCL 100 MG IV SOLR
5.0000 mg/h | INTRAVENOUS | Status: DC
  Filled 2014-09-17: qty 100

## 2014-09-17 NOTE — Progress Notes (Signed)
eLink Physician-Brief Progress Note Patient Name: FELICE HOPE DOB: 05-08-1930 MRN: [id number]   Date of Service  09/17/2014  HPI/Events of Note  HR better, but BP low.  eICU Interventions  Will add phenylephrine.     Intervention Category Major Interventions: Other:  Aayush Gelpi 09/17/2014, 1:17 AM

## 2014-09-17 NOTE — Progress Notes (Signed)
eLink Physician-Brief Progress Note Patient Name: YAZMYN VALBUENA DOB: [birth year] MRN: [id number]   Date of Service  09/17/2014  HPI/Events of Note  Patient with a.fib, and resp distress  eICU Interventions  Metoprolol 2m IV x 1 Morphine 142mx 1     Intervention Category Intermediate Interventions: Arrhythmia - evaluation and management  Rashidah Belleville 09/17/2014, 5:50 PM

## 2014-09-17 NOTE — Progress Notes (Signed)
Cardiology Cross Cover Note Called at beside for evaluation of patient with respiratory distress and Afib RVR Sepsis, tachypnea, severe resp distress, DNI Morphine and ativan given  Cardizem 5 mg IV X 1 duoneb treatment Neo for hypotension  Currently on pressor with stable BP RVR is due to her sepsis and resp distress , supportive care and treat underlying sepsis Unfortunately prognosis in her setting is poor I discussed with family (sons) at bedside. They showed understanding and understand that she may not survive this episode/hospilization Also, discussed with Dr. Wilhelmina Mcardle on call critical care (primary team) and they will come see patient and discuss with family regarding goals of care [place]

## 2014-09-17 NOTE — Progress Notes (Signed)
8:49 PM 09/17/2014 Sara Russell here at request of sons for last rites. Magic Mohler, Carolynn Comment

## 2014-09-17 NOTE — Progress Notes (Signed)
Pt is wearing a 2L [place]. Sat is 100. RT will continue to monitor.

## 2014-09-17 NOTE — Progress Notes (Signed)
Notified pharmacist that patient has chronic atrial fib, and no current order for anticoagulation found.  Pharmacist to evaluate situation.  Will continue to monitor pt closely.

## 2014-09-17 NOTE — Progress Notes (Signed)
Patient states she is starting to feel chills coming on.  Noted shivering on all extremities. Temp 94.1 axillary.  Dr. Elsworth Soho and Shon Millet NP here to assess pt.  May use bair hugger for pt comfort.  Will continue to monitor pt closely.

## 2014-09-17 NOTE — Progress Notes (Signed)
Sara Russell here to evaluate pt.  Orders received.  Will continue to monitor pt closely.

## 2014-09-17 NOTE — Progress Notes (Signed)
Called to see for rapid a fib up to 182.  IV amiodarone bolus given and drip at 60, BP elevated 5 mg IV lopressor as well.  HR gradually decreasing.  Also anxious so IV ativan ordered.  Dr. Aundra Dubin at bedside.

## 2014-09-17 NOTE — Progress Notes (Signed)
eLink Physician-Brief Progress Note Patient Name: Sara Russell DOB: 1930/03/18 MRN: [id number]   Date of Service  09/17/2014  HPI/Events of Note  HR improved with cardizem gtt, but still elevated.  eICU Interventions  Will give lopressor 5 mg IV x one.     Intervention Category Major Interventions: Other:  Smriti Barkow 09/17/2014, 12:03 AM

## 2014-09-17 NOTE — Progress Notes (Addendum)
Patient ID: Sara Russell, female   DOB: 1930/03/28, 81 y.o.   MRN: [id number]   SUBJECTIVE: Rough night, fever to 102 with afib/RVR.  Hypotensive with diltiazem, phenylephrine titrated up.  Now off diltiazem with HR in 80s-90s (atrial fibrillation) and phenylephrine being titrated down.  Feels better, not dyspneic.   Scheduled Meds: . [START ON 09/18/2014] antiseptic oral rinse  7 mL Mouth Rinse q12n4p  . atorvastatin  10 mg Oral q1800  . [START ON 09/18/2014] chlorhexidine gluconate  15 mL Mouth Rinse BID  . pantoprazole  40 mg Oral Daily  . piperacillin-tazobactam (ZOSYN)  IV  3.375 g Intravenous Q8H  . potassium chloride  40 mEq Oral BID  . vancomycin  750 mg Intravenous Q24H   Continuous Infusions: . sodium chloride 75 mL/hr at 09/17/14 0223  . sodium chloride 500 mL (09/17/14 0050)  . phenylephrine (NEO-SYNEPHRINE) Adult infusion 35 mcg/min (09/17/14 0601)   PRN Meds:.acetaminophen **OR** acetaminophen    Filed Vitals:   09/17/14 0745 09/17/14 0800 09/17/14 0815 09/17/14 0845  BP: 111/86 122/75 115/78 104/71  Pulse: 93 92 86 89  Temp:      TempSrc:      Resp: 18 18 21 21   Height:      Weight:      SpO2:  100% 98% 100%    Intake/Output Summary (Last 24 hours) at 09/17/14 0857 Last data filed at 09/17/14 0600  Gross per 24 hour  Intake 3582.09 ml  Output   1000 ml  Net 2582.09 ml    LABS: Basic Metabolic Panel:  Recent Labs  09/16/14 1104 09/16/14 1446 09/17/14 0252  NA 128*  --  129*  K 3.3*  --  4.3  CL 96*  --  101  CO2 16*  --  15*  GLUCOSE 154*  --  214*  BUN 23*  --  26*  CREATININE 1.68*  --  1.76*  CALCIUM 7.5*  --  7.4*  MG  --  1.3* 2.5*  PHOS  --   --  1.5*   Liver Function Tests:  Recent Labs  09/15/14 1100 09/17/14 0252  AST 40  --   ALT 21  --   ALKPHOS 173*  --   BILITOT 0.9  --   PROT 6.2*  --   ALBUMIN 2.5* 1.8*   No results for input(s): LIPASE, AMYLASE in the last 72 hours. CBC:  Recent Labs  09/15/14 1100  09/16/14 0617 09/17/14 0252  WBC 12.1* 10.4 14.4*  NEUTROABS 11.0*  --  13.9*  HGB 11.8* 10.9* 10.3*  HCT 35.3* 32.3* 30.7*  MCV 89.6 87.3 88.5  PLT 269 225 151   Cardiac Enzymes:  Recent Labs  09/16/14 1104 09/16/14 1450 09/17/14 0035  TROPONINI 0.24* 0.22* 0.17*   BNP: Invalid input(s): POCBNP D-Dimer: No results for input(s): DDIMER in the last 72 hours. Hemoglobin A1C: No results for input(s): HGBA1C in the last 72 hours. Fasting Lipid Panel:  Recent Labs  09/16/14 0617  CHOL 89  HDL 13*  LDLCALC 40  TRIG 180*  CHOLHDL 6.8   Thyroid Function Tests: No results for input(s): TSH, T4TOTAL, T3FREE, THYROIDAB in the last 72 hours.  Invalid input(s): FREET3 Anemia Panel: No results for input(s): VITAMINB12, FOLATE, FERRITIN, TIBC, IRON, RETICCTPCT in the last 72 hours.  RADIOLOGY: Dg Chest 2 View  09/15/2014   CLINICAL DATA:  Shortness of breath. Pain when breathing for 2 days.  EXAM: CHEST  2 VIEW  COMPARISON:  03/20/2014  FINDINGS:  Heart size is mildly enlarged. Moderate hiatal hernia present. There are no focal consolidations. No pleural effusions. No pulmonary edema. Mild scoliosis.  IMPRESSION: 1. Cardiomegaly without edema. 2. Hiatal hernia.   Electronically Signed   By: Nolon Nations M.D.   On: 09/15/2014 11:31   Dg Chest Port 1 View  09/16/2014   CLINICAL DATA:  Shortness of Breath  EXAM: PORTABLE CHEST - 1 VIEW  COMPARISON:  September 15, 2014  FINDINGS: There is no edema or consolidation. Heart is upper normal in size with pulmonary vascularity within normal limits. Moderate hiatal hernia is present. There is atherosclerotic change in the aorta. No adenopathy. No bone lesions.  IMPRESSION: No edema or consolidation. Moderate hiatal hernia. Atherosclerotic calcification in aorta.   Electronically Signed   By: Lowella Grip III M.D.   On: 09/16/2014 11:20    PHYSICAL EXAM General: NAD Neck: JVP 7-8 cm, no thyromegaly or thyroid nodule.  Lungs: Crackles at  bases bilaterally.  CV: Nondisplaced PMI.  Heart irregular S1/S2, no S3/S4, 1/6 HSM LLSB.  No peripheral edema.   Abdomen: Soft, nontender, no hepatosplenomegaly, no distention.  Neurologic: Alert and oriented x 3.  Psych: Normal affect. Extremities: No clubbing or cyanosis.   TELEMETRY: Reviewed telemetry pt in atrial fibrillation, HR 80s-90s  ASSESSMENT AND PLAN: 81 yo with permanent atrial fibrillation was admitted with malaise/concern for infection.  Initially thought to have possible UTI.  No PNA on CXR 8/5.  Developed atrial fibrillation with RVR and hypotension.  1. ID: Fever to 102 last night, WBCs elevated, PCT elevated.  Source not yet certain.  Still pending final urine culture (?UTI/urosepsis).  There was no PNA on 8/5 CXR.   - Continue broad spectrum abx (vanco/Zosyn), awaiting culture data.  2. Hypotension: On phenylephrine, now titrating down.  Suspect septic shock.   - Continue to wean phenylephrine today.  Careful with IV fluid to avoid volume overload, currently not markedly volume overloaded.  On 2 L nasal cannula.  3. Atrial fibrillation: RVR yesterday with fever, hypotensive with diltiazem gtt, now improved with HR in 80s-90s, not on diltiazem.  Avoid diltiazem use if HR goes up in future, would use amiodarone gtt for rate control.  - Should be ok to resume Eliquis at 2.5 mg bid when determined no need for central access (would probably wait until pressor titrated to off).  4. Chronic diastolic CHF: Echo reviewed => EF 45-50% with septal hypokinesis, mild to moderate RV dilation with mildly decreased systolic function, mild to moderate pulmonary hypertension.  As above, watch closely for volume overload/development of pulmonary edema with saline infusion.  When phenyephrine weaned off, would stop IV fluid.  5. CKD: Follow creatinine, has been stable so far.  6. Elevated troponin: Suspect demand ischemia in setting of atrial fibrillation/RVR and hypotension.  Consider  outpatient Cardiolite.   Loralie Champagne 09/17/2014 9:04 AM

## 2014-09-17 NOTE — Progress Notes (Signed)
PULMONARY / CRITICAL CARE MEDICINE   Name: Sara Russell MRN: [id number] DOB: [birth year]    ADMISSION DATE:  09/15/2014 CONSULTATION DATE:  09/16/14  REFERRING MD :  Family Medicine Service  CHIEF COMPLAINT:  Hypotension  INITIAL PRESENTATION:  Admitted 8/4 with 1 day history of shortness of breath on exertion. Known history of atrial fibrillation on systemic and coagulation with Eliquis. Also has a known history of PE/DVT in 2009. Denies any infectious symptoms or complaints other than dyspnea.  STUDIES:  TTE (8/4) -  EF 45-50%, septal hypokinesis, severely dilated LA, mod/severe TR, PA peak pressure =42mHg Portable CXR (8/5) - No effusion or opacity. Hiatal hernia noted.  SIGNIFICANT EVENTS: 8/4 - admit 8/5 - transfer to icu w/ respiratory failure & a fib w/ rvr  SUBJECTIVE:  Febrile overnight.  Remains on Neo gtt.  Off diltiazem.    VITAL SIGNS: Temp:  [94.8 F (34.9 C)-102 F (38.9 C)] 95.2 F (35.1 C) (08/06 1200) Pulse Rate:  [29-148] 94 (08/06 1215) Resp:  [17-53] 17 (08/06 1215) BP: (68-230)/(25-198) 92/64 mmHg (08/06 1215) SpO2:  [94 %-100 %] 100 % (08/06 1215) FiO2 (%):  [40 %] 40 % (08/05 2330) HEMODYNAMICS:   VENTILATOR SETTINGS: Vent Mode:  [-] PCV;BIPAP FiO2 (%):  [40 %] 40 % Set Rate:  [10 bmp] 10 bmp Vt Set:  [10 mL] 10 mL PEEP:  [5 cmH20] 5 cmH20 INTAKE / OUTPUT:  Intake/Output Summary (Last 24 hours) at 09/17/14 1440 Last data filed at 09/17/14 1200  Gross per 24 hour  Intake 3998.97 ml  Output    550 ml  Net 3448.97 ml    PHYSICAL EXAMINATION: General:  Awake. Alert. No acute distress. Sitting watching TV. Son at bedside.  Integument:  Moist and diaphoretic. No rash on exposed skin. No bruising. Lymphatics:  No appreciated cervical or supraclavicular lymphadenoapthy. HEENT:  Moist mucus membranes. No oral ulcers. No scleral injection or icterus. PERRL. Cardiovascular:  Regular rate. Irregular rhythm. No edema. No appreciable JVD.  Pulmonary:   Good aeration & clear to auscultation bilaterally. Symmetric chest wall expansion. No accessory muscle use on nasal cannula oxygen. Abdomen: Soft. Normal bowel sounds. Nondistended. Grossly nontender. Musculoskeletal:  Normal bulk and tone. Hand grip strength 5/5 bilaterally. No joint deformity or effusion appreciated. Neurological:  CN 2-12 grossly in tact. No meningismus. Moving all 4 extremities equally. Symmetric patellar deep tendon reflexes. Psychiatric:  Mood and affect congruent. Speech normal rhythm, rate & tone.   LABS:  CBC  Recent Labs Lab 09/15/14 1100 09/16/14 0617 09/17/14 0252  WBC 12.1* 10.4 14.4*  HGB 11.8* 10.9* 10.3*  HCT 35.3* 32.3* 30.7*  PLT 269 225 151   Coag's No results for input(s): APTT, INR in the last 168 hours. BMET  Recent Labs Lab 09/16/14 0617 09/16/14 1104 09/17/14 0252  NA 127* 128* 129*  K 3.1* 3.3* 4.3  CL 97* 96* 101  CO2 17* 16* 15*  BUN 23* 23* 26*  CREATININE 1.60* 1.68* 1.76*  GLUCOSE 104* 154* 214*   Electrolytes  Recent Labs Lab 09/16/14 0617 09/16/14 1104 09/16/14 1446 09/17/14 0252  CALCIUM 7.3* 7.5*  --  7.4*  MG  --   --  1.3* 2.5*  PHOS  --   --   --  1.5*   Sepsis Markers  Recent Labs Lab 09/16/14 1104 09/16/14 1432 09/16/14 1446 09/16/14 2016 09/17/14 0252  LATICACIDVEN 4.4* 1.9  --  4.2*  --   PROCALCITON  --   --  39.21  --  63.54   ABG  Recent Labs Lab 09/16/14 1100  PHART 7.471*  PCO2ART 17.6*  PO2ART 404*   Liver Enzymes  Recent Labs Lab 09/15/14 1100 09/17/14 0252  AST 40  --   ALT 21  --   ALKPHOS 173*  --   BILITOT 0.9  --   ALBUMIN 2.5* 1.8*   Cardiac Enzymes  Recent Labs Lab 09/16/14 1104 09/16/14 1450 09/17/14 0035  TROPONINI 0.24* 0.22* 0.17*   Glucose No results for input(s): GLUCAP in the last 168 hours.  Imaging No results found.   ASSESSMENT / PLAN:  PULMONARY A:  Acute hypoxic respiratory failure  P:   Wean FiO2 for saturation greater than  92% Attempt to avoid volume overload with IV fluid Pulmonary hygiene as able   CARDIOVASCULAR A:  A. fib with RVR Elevated troponin - suspect demand ischemia  Shock - presumed septic  H/O HTN  H/O HLD P:  Cardiology following Continue telemetry monitoring  Holding systemic anticoagulation at this time given potential need for placement of central venous catheter Holding B blocker  Holding diltiazem drip - per cards use amio if further rate control needed  Continue Lipitor F/u lactate  Trend pct    RENAL A:   Acute on chronic renal failure - improving slowly  Hypokalemia - replacing IV & PO Hyponatremia Lactic acidosis - worsening  P:   Replete K PRN  F/u chem  Checking serum magnesium level Monitor urine output & daily BUN/creatinine   GASTROINTESTINAL A:   Hx ulcerative colitis  P:   Protonix PO daily Hemoccult stools  ct abd/pelvis with worsening lactic acidosis, ongoing pressor needs    HEMATOLOGIC A:   Leukocytosis - resolved Mild Anemia - no signs of active bleeding H/O PE/DVT - 2009  P:  Monitor hemoglobin daily with CBC Holding Eliquis  INFECTIOUS A:   Possible UTI  P:   Checking serum Procalcitonin per algorithm  BCx2 8/4>> UC 8/4>>  Abx:  Vancomycin 8/5>>> Zosyn 8/5>>>  CT abd pelvis  F/u lactate    ENDOCRINE A:   No acute issues  P:   Monitor BG on daily labs   NEUROLOGIC A:   No acute issues P:   Monitor mental status closely   FAMILY  - Updates: Son at bedside & updated regarding tenuous status.  - Inter-disciplinary family meet or Palliative Care meeting due by:  8/12    TODAY'S SUMMARY: 81 year old female with atrial fibrillation with RVR, acute on chronic renal failure, & sepsis. Worsening lactic acidosis certainly concerning. Broad-spectrum antibiotics have been started and will be continued. Getting IV calcium gluconate to prevent any further hypotension and case diltiazem IV as necessary. Also replacing  potassium chloride IV and oral. Checking stat magnesium to determine if replacement is necessary. Close monitoring. Did discuss CODE STATUS with the patient and her son present. She is going to further consider whether or not she wishes to reverse her CODE STATUS to full code and will let us know her wishes.   Nickolas Madrid, NP 09/17/2014  2:40 PM Pager: [phone] or [phone]

## 2014-09-17 NOTE — Progress Notes (Signed)
Dr. Marlowe Sax here to assess pt.  Orders received.  Will continue to monitor pt closely.

## 2014-09-17 NOTE — Progress Notes (Signed)
Dr. Aundra Dubin at bedside to assess pt.  Orders received.  Will continue to monitor pt closely,.

## 2014-09-17 NOTE — Progress Notes (Signed)
   09/17/14 2000  Clinical Encounter Type  Visited With Patient and family together;Health care provider  Visit Type Initial;Spiritual support;Social support;ED;Patient actively dying  Referral From Family  Spiritual Encounters  Spiritual Needs Ritual;Grief support   Chaplain was paged to patient's room this evening and notified that the patient was passing away and family requested a priest. Bonney Roussel introduced himself to patient and patient's family. Patient's family did affirm that they wanted a Catholic priest to come and perform the anointing ritual. Bonney Roussel has contacted a local priest who said he should be here some time this evening. Chaplain has communicated this information to the unit. Page Elodia Florence chaplain with any further grief support needs.  Gar Ponto, Chaplain  8:43 PM

## 2014-09-17 NOTE — Progress Notes (Signed)
Requested Dr. Waldron Labs to camera in to room as pt is in respiratory distress, confused, pulling oxygen mask off.  Orders received.  Will continue to monitor pt closely.

## 2014-09-17 NOTE — Progress Notes (Signed)
Pt anxious, heart rate 170-190s (a fib RVR).  Cardiology paged.  Cecilie Kicks to come and assess patient.  Oxygen switched to non-rebreather.  Sons at bedside. Emotional support given to patient and family. Will continue to monitor pt closely.

## 2014-09-18 ENCOUNTER — Inpatient Hospital Stay (HOSPITAL_COMMUNITY): Payer: Medicare Other

## 2014-09-18 ENCOUNTER — Encounter (HOSPITAL_COMMUNITY): Payer: Self-pay | Admitting: Radiology

## 2014-09-18 DIAGNOSIS — R509 Fever, unspecified: Secondary | ICD-10-CM

## 2014-09-18 DIAGNOSIS — N3001 Acute cystitis with hematuria: Secondary | ICD-10-CM

## 2014-09-18 DIAGNOSIS — I959 Hypotension, unspecified: Secondary | ICD-10-CM

## 2014-09-18 DIAGNOSIS — D72829 Elevated white blood cell count, unspecified: Secondary | ICD-10-CM

## 2014-09-18 DIAGNOSIS — I482 Chronic atrial fibrillation: Secondary | ICD-10-CM

## 2014-09-18 DIAGNOSIS — K7689 Other specified diseases of liver: Secondary | ICD-10-CM

## 2014-09-18 DIAGNOSIS — A419 Sepsis, unspecified organism: Principal | ICD-10-CM

## 2014-09-18 DIAGNOSIS — R6521 Severe sepsis with septic shock: Secondary | ICD-10-CM

## 2014-09-18 LAB — BASIC METABOLIC PANEL
Anion gap: 8 (ref 5–15)
BUN: 26 mg/dL — ABNORMAL HIGH (ref 6–20)
CO2: 28 mmol/L (ref 22–32)
Calcium: 9 mg/dL (ref 8.9–10.3)
Chloride: 109 mmol/L (ref 101–111)
Creatinine, Ser: 0.59 mg/dL (ref 0.44–1.00)
GFR calc Af Amer: >60 mL/min (ref >60)
GFR calc non Af Amer: >60 mL/min (ref >60)
Glucose, Bld: 107 mg/dL — ABNORMAL HIGH (ref 65–99)
Potassium: 3.5 mmol/L (ref 3.5–5.1)
Sodium: 145 mmol/L (ref 135–145)

## 2014-09-18 LAB — CBC WITH DIFFERENTIAL/PLATELET
Basophils Absolute: 0 10*3/uL (ref 0.0–0.1)
Basophils Relative: 0 % (ref 0–1)
Eosinophils Absolute: 0.3 10*3/uL (ref 0.0–0.7)
Eosinophils Relative: 4 % (ref 0–5)
HCT: 42.3 % (ref 36.0–46.0)
Hemoglobin: 13.5 g/dL (ref 12.0–15.0)
Lymphocytes Relative: 25 % (ref 12–46)
Lymphs Abs: 2.2 10*3/uL (ref 0.7–4.0)
MCH: 29.8 pg (ref 26.0–34.0)
MCHC: 31.9 g/dL (ref 30.0–36.0)
MCV: 93.4 fL (ref 78.0–100.0)
Monocytes Absolute: 0.6 10*3/uL (ref 0.1–1.0)
Monocytes Relative: 6 % (ref 3–12)
Neutro Abs: 5.8 10*3/uL (ref 1.7–7.7)
Neutrophils Relative %: 65 % (ref 43–77)
Platelets: 287 10*3/uL (ref 150–400)
RBC: 4.53 MIL/uL (ref 3.87–5.11)
RDW: 14.6 % (ref 11.5–15.5)
WBC: 8.9 10*3/uL (ref 4.0–10.5)

## 2014-09-18 LAB — PROCALCITONIN: Procalcitonin: 84.81 ng/mL

## 2014-09-18 MED ORDER — SODIUM CHLORIDE 0.9 % IV SOLN
600.0000 mg | Freq: Once | INTRAVENOUS | Status: AC
  Administered 2014-09-18: 600 mg via INTRAVENOUS
  Filled 2014-09-18: qty 6

## 2014-09-18 MED ORDER — CETYLPYRIDINIUM CHLORIDE 0.05 % MT LIQD: 7.0000 mL | Freq: Two times a day (BID) | OROMUCOSAL | Status: DC

## 2014-09-18 MED ORDER — POTASSIUM & SODIUM PHOSPHATES 280-160-250 MG PO PACK
2.0000 | PACK | Freq: Three times a day (TID) | ORAL | Status: AC
  Administered 2014-09-18: 2 via ORAL
  Filled 2014-09-18 (×2): qty 2

## 2014-09-18 MED ORDER — SODIUM CHLORIDE 0.9 % IV SOLN
1.0000 g | Freq: Two times a day (BID) | INTRAVENOUS | Status: DC
  Administered 2014-09-18 – 2014-09-19 (×2): 1 g via INTRAVENOUS
  Filled 2014-09-18 (×3): qty 1

## 2014-09-18 NOTE — Progress Notes (Signed)
ANTIBIOTIC CONSULT NOTE - INITIAL  Pharmacy Consult for meropenem Indication: inta-abdominal infection  Allergies  Allergen Reactions  . Iohexol Other (See Comments)    unknown  . Ivp Dye [Iodinated Diagnostic Agents] Hives  . Scallops [Shellfish Allergy] Other (See Comments)    Projectile vomiting.  . Sulfa Antibiotics Hives  . Sulfamethoxazole Hives    REACTION: unspecified    Patient Measurements: Height: 5' 1"  (154.9 cm) Weight: 150 lb 2.1 oz (68.1 kg) IBW/kg (Calculated) : 47.8   Vital Signs: Temp: 102.7 F (39.3 C) (08/07 1600) Temp Source: Axillary (08/07 1600) BP: 168/112 mmHg (08/07 1645) Pulse Rate: 36 (08/07 1645) Intake/Output from previous day: 08/06 0701 - 08/07 0700 In: 3193.7 [P.O.:660; I.V.:2221.2; IV Piggyback:312.5] Out: 315 [Urine:315] Intake/Output from this shift: Total I/O In: 1775.1 [P.O.:1140; I.V.:435.1; IV Piggyback:200] Out: 495 [Urine:495]  Labs:  Recent Labs  09/16/14 0617 09/16/14 1104 09/17/14 0252 09/18/14 0900  WBC 10.4  --  14.4* 8.9  HGB 10.9*  --  10.3* 13.5  PLT 225  --  151 287  CREATININE 1.60* 1.68* 1.76* 0.59   Estimated Creatinine Clearance: 47.8 mL/min (by C-G formula based on Cr of 0.59). No results for input(s): VANCOTROUGH, VANCOPEAK, VANCORANDOM, GENTTROUGH, GENTPEAK, GENTRANDOM, TOBRATROUGH, TOBRAPEAK, TOBRARND, AMIKACINPEAK, AMIKACINTROU, AMIKACIN in the last 72 hours.   Microbiology: Recent Results (from the past 720 hour(s))  Culture, blood (routine x 2)     Status: None (Preliminary result)   Collection Time: 09/15/14  3:55 PM  Result Value Ref Range Status   Specimen Description BLOOD LEFT HAND  Final   Special Requests BOTTLES DRAWN AEROBIC AND ANAEROBIC 5CC  Final   Culture NO GROWTH 3 DAYS  Final   Report Status PENDING  Incomplete  Culture, blood (routine x 2)     Status: None (Preliminary result)   Collection Time: 09/15/14  4:03 PM  Result Value Ref Range Status   Specimen Description BLOOD  RIGHT ANTECUBITAL  Final   Special Requests BOTTLES DRAWN AEROBIC AND ANAEROBIC 5CC  Final   Culture NO GROWTH 3 DAYS  Final   Report Status PENDING  Incomplete  Culture, Urine     Status: None   Collection Time: 09/15/14  7:43 PM  Result Value Ref Range Status   Specimen Description URINE, RANDOM  Final   Special Requests NONE  Final   Culture MULTIPLE SPECIES PRESENT, SUGGEST RECOLLECTION  Final   Report Status 09/17/2014 FINAL  Final  MRSA PCR Screening     Status: None   Collection Time: 09/16/14 12:35 PM  Result Value Ref Range Status   MRSA by PCR NEGATIVE NEGATIVE Final    Comment:        The GeneXpert MRSA Assay (FDA approved for NASAL specimens only), is one component of a comprehensive MRSA colonization surveillance program. It is not intended to diagnose MRSA infection nor to guide or monitor treatment for MRSA infections.   Culture, blood (routine x 2)     Status: None (Preliminary result)   Collection Time: 09/16/14  2:32 PM  Result Value Ref Range Status   Specimen Description BLOOD BLOOD RIGHT HAND  Final   Special Requests BOTTLES DRAWN AEROBIC AND ANAEROBIC 10CC  Final   Culture NO GROWTH 2 DAYS  Final   Report Status PENDING  Incomplete  Culture, blood (routine x 2)     Status: None (Preliminary result)   Collection Time: 09/16/14  2:45 PM  Result Value Ref Range Status   Specimen Description BLOOD BLOOD  LEFT HAND  Final   Special Requests BOTTLES DRAWN AEROBIC AND ANAEROBIC 10CC  Final   Culture NO GROWTH 2 DAYS  Final   Report Status PENDING  Incomplete  Culture, Urine     Status: None (Preliminary result)   Collection Time: 09/17/14  7:13 PM  Result Value Ref Range Status   Specimen Description URINE, CATHETERIZED  Final   Special Requests NONE  Final   Culture NO GROWTH < 24 HOURS  Final   Report Status PENDING  Incomplete    Assessment: 70 YOF with hypotension, fever, rigors and leukocytosis with unclear infectious process. Family report of  colitis recently.  Has been on Zosyn, now to transition to meropenem.  SCr improved this morning to 0.59, CrCl ~45-41m/min. WBC improved at 8.9, tmax 102.7.  Goal of Therapy:  proper dosing based on renal and hepatic function  Plan:  -meropenem 1g IV q12h -follow ID plans, renal function, c/s  Fredderick Swanger D. Alyona Romack, PharmD, BCPS Clinical Pharmacist Pager: [phone]/08/2014 5:06 PM

## 2014-09-18 NOTE — Progress Notes (Signed)
Chart and tele reviewed.  Now on amio gtt.   HR 90-105 (much improved) in setting of chronic AF and sepsis. Now DNR.  Little else to add at this point.   Cardiology will follow at a distance. Please call with questions.   Tadarrius Burch,MD 12:41 PM

## 2014-09-18 NOTE — Progress Notes (Addendum)
Patient C/O chills, O2 saturation began to drop into low 80s' & HR sustaining in 120-140's afib.  Bair hugger & NRB applied.  Dr. Elsworth Soho notified of change in condition.  New orders received.  Will continue to monitor. [place], Ardeth Sportsman

## 2014-09-18 NOTE — Progress Notes (Signed)
PULMONARY / CRITICAL CARE MEDICINE   Name: Sara Russell MRN: [id number] DOB: 20-Jan-1931    ADMISSION DATE:  09/15/2014 CONSULTATION DATE:  09/16/14  REFERRING MD :  Family Medicine Service  CHIEF COMPLAINT:  Hypotension  INITIAL PRESENTATION:  Admitted 8/4 with 1 day history of shortness of breath on exertion. Known history of atrial fibrillation on systemic and coagulation with Eliquis. Also has a known history of PE/DVT in 2009. Denies any infectious symptoms or complaints other than dyspnea.  STUDIES:  TTE (8/4) -  EF 45-50%, septal hypokinesis, severely dilated LA, mod/severe TR, PA peak pressure =9mHg Portable CXR (8/5) - No effusion or opacity. Hiatal hernia noted.  SIGNIFICANT EVENTS: 8/4 - admit 8/5 - transfer to icu w/ respiratory failure & a fib w/ rvr 8/6 hypothermic with rigors  SUBJECTIVE: hypothermic overnight.  Remains on Neo gtt.   Poor UO Looks great again this am !  VITAL SIGNS: Temp:  [94.1 F (34.5 C)-98.6 F (37 C)] 97.3 F (36.3 C) (08/07 0300) Pulse Rate:  [42-186] 87 (08/07 0800) Resp:  [15-50] 18 (08/07 0800) BP: (51-162)/(10-136) 113/73 mmHg (08/07 0800) SpO2:  [82 %-100 %] 100 % (08/07 0700) FiO2 (%):  [50 %-100 %] 50 % (08/06 2332) HEMODYNAMICS:   VENTILATOR SETTINGS: Vent Mode:  [-] PCV;BIPAP FiO2 (%):  [50 %-100 %] 50 % Set Rate:  [10 bmp] 10 bmp PEEP:  [5 cmH20] 5 cmH20 INTAKE / OUTPUT:  Intake/Output Summary (Last 24 hours) at 09/18/14 0849 Last data filed at 09/18/14 0800  Gross per 24 hour  Intake 3102.92 ml  Output    315 ml  Net 2787.92 ml    PHYSICAL EXAMINATION: General:  Awake. Alert. No acute distress.  Integument:  Moist and diaphoretic. No rash on exposed skin. No bruising. Lymphatics:  No appreciated cervical or supraclavicular lymphadenoapthy. HEENT:  Moist mucus membranes. No oral ulcers. No scleral injection or icterus. PERRL. Cardiovascular:  Regular rate. Irregular rhythm. No edema. No appreciable JVD.   Pulmonary:  Good aeration & clear to auscultation bilaterally. Symmetric chest wall expansion. No accessory muscle use on nasal cannula oxygen. Abdomen: Soft. Normal bowel sounds. Nondistended. Grossly nontender. Musculoskeletal:  Normal bulk and tone. Hand grip strength 5/5 bilaterally. No joint deformity or effusion appreciated. Neurological:  CN 2-12 grossly in tact. No meningismus. Moving all 4 extremities equally. Symmetric patellar deep tendon reflexes. Psychiatric:  Mood and affect congruent. Speech normal rhythm, rate & tone.   LABS:  CBC  Recent Labs Lab 09/15/14 1100 09/16/14 0617 09/17/14 0252  WBC 12.1* 10.4 14.4*  HGB 11.8* 10.9* 10.3*  HCT 35.3* 32.3* 30.7*  PLT 269 225 151   Coag's No results for input(s): APTT, INR in the last 168 hours. BMET  Recent Labs Lab 09/16/14 0617 09/16/14 1104 09/17/14 0252  NA 127* 128* 129*  K 3.1* 3.3* 4.3  CL 97* 96* 101  CO2 17* 16* 15*  BUN 23* 23* 26*  CREATININE 1.60* 1.68* 1.76*  GLUCOSE 104* 154* 214*   Electrolytes  Recent Labs Lab 09/16/14 0617 09/16/14 1104 09/16/14 1446 09/17/14 0252  CALCIUM 7.3* 7.5*  --  7.4*  MG  --   --  1.3* 2.5*  PHOS  --   --   --  1.5*   Sepsis Markers  Recent Labs Lab 09/16/14 1432 09/16/14 1446 09/16/14 2016 09/17/14 0252 09/17/14 1618 09/18/14 0325  LATICACIDVEN 1.9  --  4.2*  --  3.6*  --   PROCALCITON  --  39.21  --  63.54  --  84.81   ABG  Recent Labs Lab 09/16/14 1100 09/17/14 1842  PHART 7.471* 7.381  PCO2ART 17.6* 22.5*  PO2ART 404* 202.0*   Liver Enzymes  Recent Labs Lab 09/15/14 1100 09/17/14 0252  AST 40  --   ALT 21  --   ALKPHOS 173*  --   BILITOT 0.9  --   ALBUMIN 2.5* 1.8*   Cardiac Enzymes  Recent Labs Lab 09/16/14 1104 09/16/14 1450 09/17/14 0035  TROPONINI 0.24* 0.22* 0.17*   Glucose No results for input(s): GLUCAP in the last 168 hours.  Imaging Dg Chest Port 1 View  09/17/2014   CLINICAL DATA:  Respiratory distress.   EXAM: PORTABLE CHEST - 1 VIEW  COMPARISON:  09/16/2014.  FINDINGS: Cardiac silhouette is mildly enlarged. Opacity in the retrocardiac region is likely moderate hiatal hernia. No hilar masses or adenopathy.  No lung consolidation or edema. No pleural effusion or pneumothorax.  Bony thorax is demineralized but grossly intact.  No significant change from the previous day's study.  IMPRESSION: No acute cardiopulmonary disease.   Electronically Signed   By: Lajean Manes M.D.   On: 09/17/2014 19:47     ASSESSMENT / PLAN:  PULMONARY A:  Acute hypoxic respiratory failure -during rigors  P:   Pulmonary hygiene as able   CARDIOVASCULAR A:  A. fib with RVR Elevated troponin - suspect demand ischemia  Shock - presumed septic  H/O HTN  H/O HLD P:  Cardiology following Holding systemic anticoagulation at this time given potential need for placement of central venous catheter Holding B blocker  Holding diltiazem drip - per cards use amio if further rate control needed  Continue Lipitor    RENAL A:   Acute on chronic renal failure - improving slowly  Hypokalemia - replacing IV & PO Hyponatremia Lactic acidosis -improving hypophos P:   Replete lytes prn Monitor urine output & daily BUN/creatinine   GASTROINTESTINAL A:   Hx ulcerative colitis  P:   Protonix PO daily Hemoccult stools  ct abd/pelvis with worsening lactic acidosis, ongoing pressor needs    HEMATOLOGIC A:   Leukocytosis - resolved Mild Anemia - no signs of active bleeding H/O PE/DVT - 2009  P:  Monitor hemoglobin daily with CBC Holding Eliquis  INFECTIOUS A:   Possible UTI - with rigors concern for abd abscess/ pyelo  P:   Rising serum Procalcitonin per algorithm  BCx2 8/4>> UC 8/4>>  Abx:  Vancomycin 8/5>>> Zosyn 8/5>>>  CT abd pelvis , PO contrast only   ENDOCRINE A:   No acute issues  P:   Monitor BG on daily labs   NEUROLOGIC A:   No acute issues P:   Monitor mental status     FAMILY  - Updates: Sons at bedside & updated regarding tenuous status.  - Inter-disciplinary family meet or Palliative Care meeting due by:  DNR issued    TODAY'S SUMMARY: 81 year old female with atrial fibrillation with RVR, acute on chronic renal failure, & sepsis - unclear source . During rigors, her breathing & RVR gets worse. DNR issued  The patient is critically ill with multiple organ systems failure and requires high complexity decision making for assessment and support, frequent evaluation and titration of therapies, application of advanced monitoring technologies and extensive interpretation of multiple databases. Critical Care Time devoted to patient care services described in this note independent of APP time is 31 minutes.     Kara Mead MD. Shade Flood. [facility] Pager  [phone] If no response call [phone]   09/18/2014  8:49 AM

## 2014-09-18 NOTE — Consult Note (Addendum)
[facility]  Total days of antibiotics 4        Day 3 vanco        Day 3 piptazo               Reason for Consult: rigors of unknown source    Referring Physician: alva  Active Problems:   HYPERCHOLESTEROLEMIA   HYPERTENSION, BENIGN SYSTEMIC   Pulmonary embolism and infarction   GERD   A-fib   Hyponatremia   AKI (acute kidney injury)   SOB (shortness of breath)   Elevated troponin   Chronic diastolic congestive heart failure   Elevated WBC count   Urinary tract infectious disease   Blood poisoning   Septic shock    HPI: Sara Russell is a 81 y.o. female with hx of chronic afib, HTN, HLD, hx of PE/DVT, fracture pelvis in Feb 2016, retired Therapist, sports who was in good health until 8/3 where she had acute onset of DOE which improved with rest as well as symptoms of dizziness. She states that she had noticed having feeling warm, as well as having chills. Due to feeling poorly she came to the ED on 8/4 to be evaluated. Family believes that she probably was feeling poorly 3-4 days prior to admit but not admitting to it based upon visiting her home that had been unkept (which is highly unusual for her). This year, she has been suffering from bouts of colitis, and had recent episode in late July.she does not take antibiotics for episodes. She was last hospitalized in February when she had GLF with fractured pelvis. Since then recovered, but still deconditioned, fatigued. No weight loss per family    She was found to be initially afebrile, but tachycardic at 110s in afib. Her labs revealed that she had leukocytosis of 12K with left shift, 90% N. Hyponatremia with aki. Which would have corresponded with presentation of dark urine. It was thought she was dehydrated. She denies cough, or dysuria. She was started on ceftriaxone for possible uti( UA + mod LE but only3-6 wbc). cxr did not show any new infiltrate to think of pneumonia. Over the next 24-36hr, she started to have  high fevers of 102F with associated rigors, tachycardia with afib with rvr, with corresponding hypotension. Her abtx were changed to vanco/piptazo and she was transferred to the icu for evaluation. She was placed on phenylephrine and cardizem gtt for hypotension and rate control of her afib. Infectious work up thus far is negative. She does have elevated procalcitonin. abd CT without IV contrast shows a 4.5cm lesion at base of caudate adjacent to IVC, no other significant finding, which I have reviewed personally on CT scan. Since being on broad spectrum she is not necessarily responding as quitck to AIV abtx since she still has periods of  high fevers up to 102 with tachypnea requiring ventimask, as well as hypothermia. Her WBC is trending down 9K with left shift resolved.  pulm critical care asked for ID input on abtx management.  Past Medical History  Diagnosis Date  . S/P dilatation of esophageal stricture 2002  . Hypertension   . Hyperlipidemia   . DVT (deep venous thrombosis) 2009  . PE (pulmonary embolism) 2009  . GERD (gastroesophageal reflux disease)   . Diverticulosis   . Iron deficiency anemia   . Hyperplastic colon polyp   . Positive H. pylori test 01/20/96  . Degenerative joint disease   . Skin cancer  h/o basal and squamous skin cancer--> removed  . Persistent atrial fibrillation   . Shortness of breath dyspnea   . AKI (acute kidney injury) 09/15/2014    Allergies:  Allergies  Allergen Reactions  . Iohexol Other (See Comments)    unknown  . Ivp Dye [Iodinated Diagnostic Agents] Hives  . Scallops [Shellfish Allergy] Other (See Comments)    Projectile vomiting.  . Sulfa Antibiotics Hives  . Sulfamethoxazole Hives    REACTION: unspecified   MEDICATIONS: . antiseptic oral rinse  7 mL Mouth Rinse q12n4p  . atorvastatin  10 mg Oral q1800  . chlorhexidine gluconate  15 mL Mouth Rinse BID  . metoprolol  5 mg Intravenous Once  . pantoprazole  40 mg Oral Daily  .  piperacillin-tazobactam (ZOSYN)  IV  3.375 g Intravenous Q8H  . potassium & sodium phosphates  2 packet Oral TID WC & HS  . potassium chloride  40 mEq Oral BID  . vancomycin  750 mg Intravenous Q24H    History  Substance Use Topics  . Smoking status: Former Smoker -- 0.50 packs/day for 13 years    Types: Cigarettes    Start date: 02/12/1948    Quit date: 02/11/1961  . Smokeless tobacco: Never Used     Comment: Denies use of tobacco since 1963  . Alcohol Use: 0.0 oz/week    7-14 Glasses of wine per week     Comment: 1-2 glasses of wine per day    Family History  Problem Relation Age of Onset  . Esophageal cancer Brother   . Diverticulitis Brother   . Colon cancer Neg Hx   . Heart disease Mother   . Stroke Mother     Review of Systems  Unable to obtain due to patient in discomfort on ventimask   OBJECTIVE: Temp:  [94.2 F (34.6 C)-98.6 F (37 C)] 97.8 F (36.6 C) (08/07 1200) Pulse Rate:  [27-186] 39 (08/07 1500) Resp:  [15-50] 30 (08/07 1500) BP: (51-162)/(10-136) 84/22 mmHg (08/07 1500) SpO2:  [85 %-100 %] 94 % (08/07 1500) FiO2 (%):  [50 %-100 %] 50 % (08/06 2332) Physical Exam  Constitutional:  oriented to person. appears well-developed and well-nourished. In distress. HENT: [place]/AT, PERRLA, no scleral icterus, Mouth/Throat: Oropharynx is dry. No oropharyngeal exudate.  Cardiovascular: tachy, irreg, irreg. + murmur at apex Pulmonary/Chest: Effort normal and breath sounds normal. No respiratory distress.  has no wheezes.  Neck = supple, no nuchal rigidity Abdominal: Soft. Bowel sounds are decreased  exhibits no distension. There is no tenderness.  Lymphadenopathy: no cervical adenopathy. No axillary adenopathy Neurological: awakens to name Skin: lower extremity legs are mottled Psychiatric: a normal mood and affect.  behavior is normal.   LABS: Results for orders placed or performed during the hospital encounter of 09/15/14 (from the past 48 hour(s))  Lactic  acid, plasma     Status: Abnormal   Collection Time: 09/16/14  8:16 PM  Result Value Ref Range   Lactic Acid, Venous 4.2 (HH) 0.5 - 2.0 mmol/L    Comment: REPEATED TO VERIFY CRITICAL RESULT CALLED TO, READ BACK BY AND VERIFIED WITH: J[facility] 09/16/14 2112 WAYK   Troponin I (q 6hr x 3)     Status: Abnormal   Collection Time: 09/17/14 12:35 AM  Result Value Ref Range   Troponin I 0.17 (H) <0.031 ng/mL    Comment:        PERSISTENTLY INCREASED TROPONIN VALUES IN THE RANGE OF 0.04-0.49 ng/mL CAN BE SEEN IN:       -  UNSTABLE ANGINA       -CONGESTIVE HEART FAILURE       -MYOCARDITIS       -CHEST TRAUMA       -ARRYHTHMIAS       -LATE PRESENTING MYOCARDIAL INFARCTION       -COPD   CLINICAL FOLLOW-UP RECOMMENDED.   Renal function panel     Status: Abnormal   Collection Time: 09/17/14  2:52 AM  Result Value Ref Range   Sodium 129 (L) 135 - 145 mmol/L   Potassium 4.3 3.5 - 5.1 mmol/L    Comment: DELTA CHECK NOTED   Chloride 101 101 - 111 mmol/L   CO2 15 (L) 22 - 32 mmol/L   Glucose, Bld 214 (H) 65 - 99 mg/dL   BUN 26 (H) 6 - 20 mg/dL   Creatinine, Ser 1.76 (H) 0.44 - 1.00 mg/dL   Calcium 7.4 (L) 8.9 - 10.3 mg/dL   Phosphorus 1.5 (L) 2.5 - 4.6 mg/dL   Albumin 1.8 (L) 3.5 - 5.0 g/dL   GFR calc non Af Amer 26 (L) >60 mL/min   GFR calc Af Amer 30 (L) >60 mL/min    Comment: (NOTE) The eGFR has been calculated using the CKD EPI equation. This calculation has not been validated in all clinical situations. eGFR's persistently <60 mL/min signify possible Chronic Kidney Disease.    Anion gap 13 5 - 15  CBC with Differential/Platelet     Status: Abnormal   Collection Time: 09/17/14  2:52 AM  Result Value Ref Range   WBC 14.4 (H) 4.0 - 10.5 K/uL   RBC 3.47 (L) 3.87 - 5.11 MIL/uL   Hemoglobin 10.3 (L) 12.0 - 15.0 g/dL   HCT 30.7 (L) 36.0 - 46.0 %   MCV 88.5 78.0 - 100.0 fL   MCH 29.7 26.0 - 34.0 pg   MCHC 33.6 30.0 - 36.0 g/dL   RDW 13.5 11.5 - 15.5 %   Platelets 151 150 - 400  K/uL   Neutrophils Relative % 96 (H) 43 - 77 %   Lymphocytes Relative 1 (L) 12 - 46 %   Monocytes Relative 3 3 - 12 %   Eosinophils Relative 0 0 - 5 %   Basophils Relative 0 0 - 1 %   Neutro Abs 13.9 (H) 1.7 - 7.7 K/uL   Lymphs Abs 0.1 (L) 0.7 - 4.0 K/uL   Monocytes Absolute 0.4 0.1 - 1.0 K/uL   Eosinophils Absolute 0.0 0.0 - 0.7 K/uL   Basophils Absolute 0.0 0.0 - 0.1 K/uL   RBC Morphology BURR CELLS    WBC Morphology MILD LEFT SHIFT (1-5% METAS, OCC MYELO, OCC BANDS)   Magnesium     Status: Abnormal   Collection Time: 09/17/14  2:52 AM  Result Value Ref Range   Magnesium 2.5 (H) 1.7 - 2.4 mg/dL  Procalcitonin     Status: None   Collection Time: 09/17/14  2:52 AM  Result Value Ref Range   Procalcitonin 63.54 ng/mL    Comment:        Interpretation: PCT >= 10 ng/mL: Important systemic inflammatory response, almost exclusively due to severe bacterial sepsis or septic shock. (NOTE)         ICU PCT Algorithm               Non ICU PCT Algorithm    ----------------------------     ------------------------------         PCT < 0.25 ng/mL  PCT < 0.1 ng/mL     Stopping of antibiotics            Stopping of antibiotics       strongly encouraged.               strongly encouraged.    ----------------------------     ------------------------------       PCT level decrease by               PCT < 0.25 ng/mL       >= 80% from peak PCT       OR PCT 0.25 - 0.5 ng/mL          Stopping of antibiotics                                             encouraged.     Stopping of antibiotics           encouraged.    ----------------------------     ------------------------------       PCT level decrease by              PCT >= 0.25 ng/mL       < 80% from peak PCT        AND PCT >= 0.5 ng/mL             Continuing antibiotics                                              encouraged.       Continuing antibiotics            encouraged.    ----------------------------      ------------------------------     PCT level increase compared          PCT > 0.5 ng/mL         with peak PCT AND          PCT >= 0.5 ng/mL             Escalation of antibiotics                                          strongly encouraged.      Escalation of antibiotics        strongly encouraged.   Lactic acid, plasma     Status: Abnormal   Collection Time: 09/17/14  4:18 PM  Result Value Ref Range   Lactic Acid, Venous 3.6 (HH) 0.5 - 2.0 mmol/L    Comment: REPEATED TO VERIFY CRITICAL RESULT CALLED TO, READ BACK BY AND VERIFIED WITH: Mylo Red RN AT 7564 09/17/14 BY WOOLLENK   I-STAT 3, arterial blood gas (G3+)     Status: Abnormal   Collection Time: 09/17/14  6:42 PM  Result Value Ref Range   pH, Arterial 7.381 7.350 - 7.450   pCO2 arterial 22.5 (L) 35.0 - 45.0 mmHg   pO2, Arterial 202.0 (H) 80.0 - 100.0 mmHg   Bicarbonate 13.3 (L) 20.0 - 24.0 mEq/L   TCO2 14 0 - 100 mmol/L   O2 Saturation 100.0 %   Acid-base deficit 10.0 (H) 0.0 -  2.0 mmol/L   Patient temperature 98.6 F    Sample type ARTERIAL   Culture, Urine     Status: None (Preliminary result)   Collection Time: 09/17/14  7:13 PM  Result Value Ref Range   Specimen Description URINE, CATHETERIZED    Special Requests NONE    Culture NO GROWTH < 24 HOURS    Report Status PENDING   Procalcitonin     Status: None   Collection Time: 09/18/14  3:25 AM  Result Value Ref Range   Procalcitonin 84.81 ng/mL    Comment:        Interpretation: PCT >= 10 ng/mL: Important systemic inflammatory response, almost exclusively due to severe bacterial sepsis or septic shock. (NOTE)         ICU PCT Algorithm               Non ICU PCT Algorithm    ----------------------------     ------------------------------         PCT < 0.25 ng/mL                 PCT < 0.1 ng/mL     Stopping of antibiotics            Stopping of antibiotics       strongly encouraged.               strongly encouraged.    ----------------------------      ------------------------------       PCT level decrease by               PCT < 0.25 ng/mL       >= 80% from peak PCT       OR PCT 0.25 - 0.5 ng/mL          Stopping of antibiotics                                             encouraged.     Stopping of antibiotics           encouraged.    ----------------------------     ------------------------------       PCT level decrease by              PCT >= 0.25 ng/mL       < 80% from peak PCT        AND PCT >= 0.5 ng/mL             Continuing antibiotics                                              encouraged.       Continuing antibiotics            encouraged.    ----------------------------     ------------------------------     PCT level increase compared          PCT > 0.5 ng/mL         with peak PCT AND          PCT >= 0.5 ng/mL             Escalation of antibiotics  strongly encouraged.      Escalation of antibiotics        strongly encouraged.   Basic metabolic panel     Status: Abnormal   Collection Time: 09/18/14  9:00 AM  Result Value Ref Range   Sodium 145 135 - 145 mmol/L    Comment: DELTA CHECK NOTED   Potassium 3.5 3.5 - 5.1 mmol/L    Comment: DELTA CHECK NOTED   Chloride 109 101 - 111 mmol/L   CO2 28 22 - 32 mmol/L   Glucose, Bld 107 (H) 65 - 99 mg/dL   BUN 26 (H) 6 - 20 mg/dL   Creatinine, Ser 0.59 0.44 - 1.00 mg/dL    Comment: DELTA CHECK NOTED FLUIDS    Calcium 9.0 8.9 - 10.3 mg/dL   GFR calc non Af Amer >60 >60 mL/min   GFR calc Af Amer >60 >60 mL/min    Comment: (NOTE) The eGFR has been calculated using the CKD EPI equation. This calculation has not been validated in all clinical situations. eGFR's persistently <60 mL/min signify possible Chronic Kidney Disease.    Anion gap 8 5 - 15  CBC with Differential/Platelet     Status: None   Collection Time: 09/18/14  9:00 AM  Result Value Ref Range   WBC 8.9 4.0 - 10.5 K/uL   RBC 4.53 3.87 - 5.11 MIL/uL   Hemoglobin 13.5  12.0 - 15.0 g/dL    Comment: REPEATED TO VERIFY   HCT 42.3 36.0 - 46.0 %   MCV 93.4 78.0 - 100.0 fL   MCH 29.8 26.0 - 34.0 pg   MCHC 31.9 30.0 - 36.0 g/dL   RDW 14.6 11.5 - 15.5 %   Platelets 287 150 - 400 K/uL   Neutrophils Relative % 65 43 - 77 %   Neutro Abs 5.8 1.7 - 7.7 K/uL   Lymphocytes Relative 25 12 - 46 %   Lymphs Abs 2.2 0.7 - 4.0 K/uL   Monocytes Relative 6 3 - 12 %   Monocytes Absolute 0.6 0.1 - 1.0 K/uL   Eosinophils Relative 4 0 - 5 %   Eosinophils Absolute 0.3 0.0 - 0.7 K/uL   Basophils Relative 0 0 - 1 %   Basophils Absolute 0.0 0.0 - 0.1 K/uL    MICRO: 8/4 blood cx ngtd 8/5 blood cx ngtd 8/4 urine cx ngtd  I reviewed her old records as well as imaging showing enhancement in caudate lobe next to ivc IMAGING: Ct Abdomen Pelvis Wo Contrast  09/18/2014   CLINICAL DATA:  SepsisHx basal and squamous cell ca., diverticulosis, GERDSurgery's; D&C, tonsillectomy, appendectomy, breast lumpectomy(benign)  EXAM: CT ABDOMEN AND PELVIS WITHOUT CONTRAST  TECHNIQUE: Multidetector CT imaging of the abdomen and pelvis was performed following the standard protocol without IV contrast.  COMPARISON:  Pelvic ultrasound, 02/27/2012.  FINDINGS: Lung bases: Small pleural effusions. Lower lobe opacity most consistent with atelectasis. Component of pneumonia is possible but felt likely. 5 mm nodule right lower lobe near the oblique fissure. No pulmonary edema. Heart mildly enlarged. Moderate size hiatal hernia.  Liver: 4.5 cm abnormality, which may reflect a mass, which lies along the posterior margin of the liver at the base caudate lobe and is inseparable from the the inferior vena cava. It also abuts the right adrenal gland. Longitudinal extent of this abnormality is approximately 6 cm.  Liver otherwise unremarkable.  Spleen: Unremarkable.  Gallbladder: Distended. No radiopaque stone. No wall thickening or adjacent inflammation. Common bile duct is dilated to approximate  1 cm but shows normal  distal tapering.  Adrenal glands. The left adrenal gland thickening consistent with nodular hyperplasia. Right adrenal gland abuts the above described lesion. Is otherwise unremarkable.  Kidneys, ureters, bladder: 3.2 cm low-density lesion in the midpole of the left kidney consistent with cysts. No other renal masses. Bilateral renal cortical thinning. No stones. Bilateral perinephric stranding, likely chronic. Left ureter is dilated. There is mild prominence of the left renal pelvis without calyx dilation. No ureteral stone. Normal right ureter. Bladder is decompressed by a Foley catheter.  Uterus and adnexa: Uterus is unremarkable. There is a left adnexal water density mass measuring 6.7 x 6.1 x 7.5 cm. This was present on the prior ultrasound and is unchanged in overall size. This could be ovarian origin. It may reflect an inclusion cyst. It stability since the 2014 ultrasound strongly supports a benign etiology.  Lymph nodes:  No enlarged lymph nodes.  Ascites:  None.  Gastrointestinal: No bowel dilation to suggest obstruction. No bowel wall thickening or mesenteric inflammation.  Musculoskeletal: Degenerative changes most evident at L2-L3. Bony structures are demineralized. No osteoblastic or osteolytic lesions.  IMPRESSION: 1. Heterogeneous masslike abnormality along the posterior margin of the liver, centered on the inferior vena cava, from which it is inseparable. It abuts the right adrenal gland. This may have an origin from the posterior margin of the liver or from the inferior vena cava. Adrenal origin is felt unlikely. This could be a neoplastic mass. This would be better evaluated with a contrast-enhanced CT scan or, if the patient can tolerate, MRI with and without contrast. 2. Small bilateral pleural effusions.  Lower lobe atelectasis. 3. Distended gallbladder without convincing acute cholecystitis. 4. No abdominal abscess. 5. Chronic left adnexal cyst measuring 7.5 cm in greatest dimension, which may  be ovarian in origin or an inclusion cyst. It is similar in size to the ultrasound dated 02/27/2012. 6. No acute bowel abnormality.   Electronically Signed   By: Lajean Manes M.D.   On: 09/18/2014 12:15   Dg Chest Port 1 View  09/17/2014   CLINICAL DATA:  Respiratory distress.  EXAM: PORTABLE CHEST - 1 VIEW  COMPARISON:  09/16/2014.  FINDINGS: Cardiac silhouette is mildly enlarged. Opacity in the retrocardiac region is likely moderate hiatal hernia. No hilar masses or adenopathy.  No lung consolidation or edema. No pleural effusion or pneumothorax.  Bony thorax is demineralized but grossly intact.  No significant change from the previous day's study.  IMPRESSION: No acute cardiopulmonary disease.   Electronically Signed   By: Lajean Manes M.D.   On: 09/17/2014 19:47    Assessment/Plan: 81yo F with chronic afib, admitted for DOE, hypotension, fevers, rigors, leukocytosis with unclear infectious process. family report hx of having colitis? Recently which possibly could be associated with liver abscess.  abd CT suggestive of liver mass/abscess, adjacent to  IVC thus creating opportunity for transient bacteremia. Unclear if her deconditioning, increase fatigue over the last few months are completely due to her hip fracture in Feb vs. Attributed to malignancy  - will change her to meropenem instead of piptazo. Continue on vanco - recommend getting mri of abdomen to get better visualization of liver lesion if she is stable - defer to PCCM for management of sepsis, hypotension, afib. - will follow up on culture results to see how to narrow antibiotics - for rigors, consider low dose demerol if morphine is not helpful.  Spoke with family, and Dr. Elsworth Soho regarding treatment plans  Caren Griffins B.  [facility] [phone]

## 2014-09-19 DIAGNOSIS — E875 Hyperkalemia: Secondary | ICD-10-CM

## 2014-09-19 DIAGNOSIS — I4891 Unspecified atrial fibrillation: Secondary | ICD-10-CM

## 2014-09-19 DIAGNOSIS — R160 Hepatomegaly, not elsewhere classified: Secondary | ICD-10-CM | POA: Insufficient documentation

## 2014-09-19 LAB — URINE CULTURE: Culture: NO GROWTH

## 2014-09-19 LAB — COMPREHENSIVE METABOLIC PANEL
ALT: 19 U/L (ref 14–54)
AST: 29 U/L (ref 15–41)
Albumin: 1.8 g/dL — ABNORMAL LOW (ref 3.5–5.0)
Alkaline Phosphatase: 174 U/L — ABNORMAL HIGH (ref 38–126)
Anion gap: 13 (ref 5–15)
BUN: 36 mg/dL — ABNORMAL HIGH (ref 6–20)
CO2: 15 mmol/L — ABNORMAL LOW (ref 22–32)
Calcium: 7.5 mg/dL — ABNORMAL LOW (ref 8.9–10.3)
Chloride: 103 mmol/L (ref 101–111)
Creatinine, Ser: 3 mg/dL — ABNORMAL HIGH (ref 0.44–1.00)
GFR calc Af Amer: 16 mL/min — ABNORMAL LOW (ref >60)
GFR calc non Af Amer: 14 mL/min — ABNORMAL LOW (ref >60)
Glucose, Bld: 143 mg/dL — ABNORMAL HIGH (ref 65–99)
Potassium: 5.6 mmol/L — ABNORMAL HIGH (ref 3.5–5.1)
Sodium: 131 mmol/L — ABNORMAL LOW (ref 135–145)
Total Bilirubin: 0.7 mg/dL (ref 0.3–1.2)
Total Protein: 5.7 g/dL — ABNORMAL LOW (ref 6.5–8.1)

## 2014-09-19 LAB — CBC
HCT: 34.1 % — ABNORMAL LOW (ref 36.0–46.0)
Hemoglobin: 11.8 g/dL — ABNORMAL LOW (ref 12.0–15.0)
MCH: 30.3 pg (ref 26.0–34.0)
MCHC: 34.6 g/dL (ref 30.0–36.0)
MCV: 87.4 fL (ref 78.0–100.0)
Platelets: 95 10*3/uL — ABNORMAL LOW (ref 150–400)
RBC: 3.9 MIL/uL (ref 3.87–5.11)
RDW: 14 % (ref 11.5–15.5)
WBC: 25.7 10*3/uL — ABNORMAL HIGH (ref 4.0–10.5)

## 2014-09-19 LAB — VANCOMYCIN, TROUGH: Vancomycin Tr: 22 ug/mL — ABNORMAL HIGH (ref 10.0–20.0)

## 2014-09-19 MED ORDER — CETYLPYRIDINIUM CHLORIDE 0.05 % MT LIQD
7.0000 mL | Freq: Two times a day (BID) | OROMUCOSAL | Status: DC
  Administered 2014-09-19 – 2014-09-20 (×3): 7 mL via OROMUCOSAL

## 2014-09-19 MED ORDER — SODIUM POLYSTYRENE SULFONATE 15 GM/60ML PO SUSP
30.0000 g | Freq: Once | ORAL | Status: AC
  Administered 2014-09-19: 30 g via ORAL
  Filled 2014-09-19: qty 120

## 2014-09-19 MED ORDER — SODIUM CHLORIDE 0.9 % IV SOLN
INTRAVENOUS | Status: DC
  Administered 2014-09-20 (×2): via INTRAVENOUS

## 2014-09-19 MED ORDER — VANCOMYCIN HCL 500 MG IV SOLR
500.0000 mg | INTRAVENOUS | Status: DC
  Filled 2014-09-19: qty 500

## 2014-09-19 MED ORDER — MEROPENEM 500 MG IV SOLR
500.0000 mg | Freq: Two times a day (BID) | INTRAVENOUS | Status: DC
  Administered 2014-09-19 – 2014-09-21 (×4): 500 mg via INTRAVENOUS
  Filled 2014-09-19 (×5): qty 0.5

## 2014-09-19 NOTE — Progress Notes (Signed)
PULMONARY / CRITICAL CARE MEDICINE   Name: Sara Russell MRN: [id number] DOB: September 17, 1930    ADMISSION DATE:  09/15/2014 CONSULTATION DATE:  09/16/14  REFERRING MD :  Family Medicine Service  CHIEF COMPLAINT:  Hypotension  INITIAL PRESENTATION:  Admitted 8/4 with 1 day history of shortness of breath on exertion. Known history of atrial fibrillation on systemic and coagulation with Eliquis. Also has a known history of PE/DVT in 2009. Denies any infectious symptoms or complaints other than dyspnea.  STUDIES:  TTE (8/4) -  EF 45-50%, septal hypokinesis, severely dilated LA, mod/severe TR, PA peak pressure =62mHg Portable CXR (8/5) - No effusion or opacity. Hiatal hernia noted.  SIGNIFICANT EVENTS: 8/4 - admit 8/5 - transfer to icu w/ respiratory failure & a fib w/ rvr 8/6 hypothermic with rigors  SUBJECTIVE: hypothermic, hypotensive and rigors overnight.  Remains on Neo gtt.   Poor UO Looks great again this am   VITAL SIGNS: Temp:  [96.8 F (36 C)-102.8 F (39.3 C)] 96.8 F (36 C) (08/08 0800) Pulse Rate:  [27-159] 102 (08/08 0800) Resp:  [14-39] 14 (08/08 0800) BP: (52-215)/(21-155) 126/109 mmHg (08/08 0800) SpO2:  [85 %-100 %] 100 % (08/08 0800) HEMODYNAMICS:   VENTILATOR SETTINGS:   INTAKE / OUTPUT:  Intake/Output Summary (Last 24 hours) at 09/19/14 1121 Last data filed at 09/19/14 1000  Gross per 24 hour  Intake 2116.16 ml  Output    315 ml  Net 1801.16 ml   PHYSICAL EXAMINATION: General:  Awake. Alert. No acute distress.  Integument:  Moist and diaphoretic. No rash on exposed skin. No bruising. Lymphatics:  No appreciated cervical or supraclavicular lymphadenoapthy. HEENT:  Moist mucus membranes. No oral ulcers. No scleral injection or icterus. PERRL. Cardiovascular:  Regular rate. Irregular rhythm. No edema. No appreciable JVD.  Pulmonary:  Good aeration & clear to auscultation bilaterally. Symmetric chest wall expansion. No accessory muscle use on nasal  cannula oxygen. Abdomen: Soft. Normal bowel sounds. Nondistended. Grossly nontender. Musculoskeletal:  Normal bulk and tone. Hand grip strength 5/5 bilaterally. No joint deformity or effusion appreciated. Neurological:  CN 2-12 grossly in tact. No meningismus. Moving all 4 extremities equally. Symmetric patellar deep tendon reflexes. Psychiatric:  Mood and affect congruent. Speech normal rhythm, rate & tone.   LABS:  CBC  Recent Labs Lab 09/17/14 0252 09/18/14 0900 09/19/14 0241  WBC 14.4* 8.9 25.7*  HGB 10.3* 13.5 11.8*  HCT 30.7* 42.3 34.1*  PLT 151 287 95*   Coag's No results for input(s): APTT, INR in the last 168 hours. BMET  Recent Labs Lab 09/17/14 0252 09/18/14 0900 09/19/14 0554  NA 129* 145 131*  K 4.3 3.5 5.6*  CL 101 109 103  CO2 15* 28 15*  BUN 26* 26* 36*  CREATININE 1.76* 0.59 3.00*  GLUCOSE 214* 107* 143*   Electrolytes  Recent Labs Lab 09/16/14 1446 09/17/14 0252 09/18/14 0900 09/19/14 0554  CALCIUM  --  7.4* 9.0 7.5*  MG 1.3* 2.5*  --   --   PHOS  --  1.5*  --   --    Sepsis Markers  Recent Labs Lab 09/16/14 1432 09/16/14 1446 09/16/14 2016 09/17/14 0252 09/17/14 1618 09/18/14 0325  LATICACIDVEN 1.9  --  4.2*  --  3.6*  --   PROCALCITON  --  39.21  --  63.54  --  84.81   ABG  Recent Labs Lab 09/16/14 1100 09/17/14 1842  PHART 7.471* 7.381  PCO2ART 17.6* 22.5*  PO2ART 404* 202.0*  Liver Enzymes  Recent Labs Lab 09/15/14 1100 09/17/14 0252 09/19/14 0554  AST 40  --  29  ALT 21  --  19  ALKPHOS 173*  --  174*  BILITOT 0.9  --  0.7  ALBUMIN 2.5* 1.8* 1.8*   Cardiac Enzymes  Recent Labs Lab 09/16/14 1104 09/16/14 1450 09/17/14 0035  TROPONINI 0.24* 0.22* 0.17*   Glucose No results for input(s): GLUCAP in the last 168 hours.  Imaging Ct Abdomen Pelvis Wo Contrast  09/18/2014   CLINICAL DATA:  SepsisHx basal and squamous cell ca., diverticulosis, GERDSurgery's; D&C, tonsillectomy, appendectomy, breast  lumpectomy(benign)  EXAM: CT ABDOMEN AND PELVIS WITHOUT CONTRAST  TECHNIQUE: Multidetector CT imaging of the abdomen and pelvis was performed following the standard protocol without IV contrast.  COMPARISON:  Pelvic ultrasound, 02/27/2012.  FINDINGS: Lung bases: Small pleural effusions. Lower lobe opacity most consistent with atelectasis. Component of pneumonia is possible but felt likely. 5 mm nodule right lower lobe near the oblique fissure. No pulmonary edema. Heart mildly enlarged. Moderate size hiatal hernia.  Liver: 4.5 cm abnormality, which may reflect a mass, which lies along the posterior margin of the liver at the base caudate lobe and is inseparable from the the inferior vena cava. It also abuts the right adrenal gland. Longitudinal extent of this abnormality is approximately 6 cm.  Liver otherwise unremarkable.  Spleen: Unremarkable.  Gallbladder: Distended. No radiopaque stone. No wall thickening or adjacent inflammation. Common bile duct is dilated to approximate 1 cm but shows normal distal tapering.  Adrenal glands. The left adrenal gland thickening consistent with nodular hyperplasia. Right adrenal gland abuts the above described lesion. Is otherwise unremarkable.  Kidneys, ureters, bladder: 3.2 cm low-density lesion in the midpole of the left kidney consistent with cysts. No other renal masses. Bilateral renal cortical thinning. No stones. Bilateral perinephric stranding, likely chronic. Left ureter is dilated. There is mild prominence of the left renal pelvis without calyx dilation. No ureteral stone. Normal right ureter. Bladder is decompressed by a Foley catheter.  Uterus and adnexa: Uterus is unremarkable. There is a left adnexal water density mass measuring 6.7 x 6.1 x 7.5 cm. This was present on the prior ultrasound and is unchanged in overall size. This could be ovarian origin. It may reflect an inclusion cyst. It stability since the 2014 ultrasound strongly supports a benign etiology.   Lymph nodes:  No enlarged lymph nodes.  Ascites:  None.  Gastrointestinal: No bowel dilation to suggest obstruction. No bowel wall thickening or mesenteric inflammation.  Musculoskeletal: Degenerative changes most evident at L2-L3. Bony structures are demineralized. No osteoblastic or osteolytic lesions.  IMPRESSION: 1. Heterogeneous masslike abnormality along the posterior margin of the liver, centered on the inferior vena cava, from which it is inseparable. It abuts the right adrenal gland. This may have an origin from the posterior margin of the liver or from the inferior vena cava. Adrenal origin is felt unlikely. This could be a neoplastic mass. This would be better evaluated with a contrast-enhanced CT scan or, if the patient can tolerate, MRI with and without contrast. 2. Small bilateral pleural effusions.  Lower lobe atelectasis. 3. Distended gallbladder without convincing acute cholecystitis. 4. No abdominal abscess. 5. Chronic left adnexal cyst measuring 7.5 cm in greatest dimension, which may be ovarian in origin or an inclusion cyst. It is similar in size to the ultrasound dated 02/27/2012. 6. No acute bowel abnormality.   Electronically Signed   By: Lajean Manes M.D.   On: 09/18/2014  12:15     ASSESSMENT / PLAN:  PULMONARY A:  Acute hypoxic respiratory failure -during rigors P:   Pulmonary hygiene as able. Titrate O2 for sat of 88-92%.  CARDIOVASCULAR A:  A. fib with RVR Elevated troponin - suspect demand ischemia  Shock - presumed septic  H/O HTN  H/O HLD P:  Cardiology following Holding B blocker  Holding diltiazem drip - per cards use amio if further rate control needed  Continue Lipitor  RENAL A:   Acute on chronic renal failure - improving slowly  Hyperkalemia Hyponatremia. Lactic acidosis -improving. Low UOP and worsening renal function. P:   Replete lytes prn Monitor urine output & daily BUN/creatinine Kayexalate 30 gm PO x1. IVF resuscitation NS at 75  ml/hr.  GASTROINTESTINAL A:   Hx ulcerative colitis  P:   Protonix PO daily Hemoccult stools  Ct abd/pelvis with likely infectious mass communicating with the SVC, see ID section.  HEMATOLOGIC A:   Leukocytosis - resolved Mild Anemia - no signs of active bleeding H/O PE/DVT - 2009  P:  Monitor hemoglobin daily with CBC Holding Eliquis  INFECTIOUS A:   Possible UTI - with rigors concern for abd abscess/ pyelo  P:   Rising serum  BCx2 8/4>>NTD UC 8/4>>NTD  Abx:  Vancomycin 8/5>>> Zosyn 8/5>>>8/7 Aztreonam 8/7>>>  ENDOCRINE A:   No acute issues  P:   Monitor BG on daily labs  NEUROLOGIC A:   No acute issues P:   Monitor mental status   FAMILY  - Updates: Sons at bedside & updated regarding tenuous status, confirmed DNR status and no dialysis.  Patient is discussing options for CT guided biopsy.  - Inter-disciplinary family meet or Palliative Care meeting due by:  DNR issued  The patient is critically ill with multiple organ systems failure and requires high complexity decision making for assessment and support, frequent evaluation and titration of therapies, application of advanced monitoring technologies and extensive interpretation of multiple databases.   Critical Care Time devoted to patient care services described in this note is  35  Minutes. This time reflects time of care of this signee Dr Jennet Maduro. This critical care time does not reflect procedure time, or teaching time or supervisory time of PA/NP/Med student/Med Resident etc but could involve care discussion time.  Rush Farmer, M.D. [facility] Pulmonary/Critical Care Medicine. Pager: [phone]. After hours pager: [phone].  09/19/2014  11:21 AM

## 2014-09-19 NOTE — Progress Notes (Signed)
Patient was doing well this morning. She was still currently on pressors this morning. Overall her vitals looked stable BP 120/81 this AM. CCM following  Patient.

## 2014-09-19 NOTE — Progress Notes (Signed)
PCP note including consent for percutaneous drainage/biopsy and goals of care.  Sara Sara Russell is a longstanding patient with whom I have a close, trusting relationship.  She has enjoyed mostly good health over the years and counts herself fortunate to have lived such a nice life to the age of 37.  She is unafraid of death and much more afraid of lingering past the time when the quality of her life has gone.  She would never consent to long term mechanical support such as a ventilator or dialysis.  Her focus is on quality rather than quantity.  That said, she was a highly functional, independent, community dwelling senior with excellent quality of life just a few weeks ago.  She now has a serious illness with a diagnosis that is not fully certain.  Diagnostic possibilities range from treatable acute problem (i.e. Sepsis with abd abscess) with a good chance of recovery back to independent living to the more ominous possibility of an untreatable cancer.    I recommended that she have the attempted drainage/biopsy of the peri hepatic lesion seen on CT scan for three important reasons: 1. We may be dealing with something fully treatable. 2. It is tough to make good choices about the future without having an accurate diagnosis.  Needling the lesion is the best and perhaps only way to get the specific diagnosis. 3. It is difficult to imagine this ending well without the drainage attempt.  The reasoning, rationale and recommendation were discussed in full with Sara Russell and her three adult sons.  She asked for time to talk over with her family before making a final decision.  She states she will have that decision by tomorrow morning.  I will visit again at that time.    Thanks to CCM for their great care Sara Russell.

## 2014-09-19 NOTE — Progress Notes (Signed)
Utilization review complete. Yeilyn Gent RN CCM Case Mgmt phone [phone] 

## 2014-09-19 NOTE — Progress Notes (Signed)
ANTIBIOTIC CONSULT NOTE - FOLLOW UP  Pharmacy Consult for vancomycin & meropenem Indication: intra-abdominal infection  Allergies  Allergen Reactions  . Iohexol Other (See Comments)    unknown  . Ivp Dye [Iodinated Diagnostic Agents] Hives  . Scallops [Shellfish Allergy] Other (See Comments)    Projectile vomiting.  . Sulfa Antibiotics Hives  . Sulfamethoxazole Hives    REACTION: unspecified    Patient Measurements: Height: 5' 1"  (154.9 cm) Weight: 150 lb 2.1 oz (68.1 kg) IBW/kg (Calculated) : 47.8   Vital Signs: Temp: 96.8 F (36 C) (08/08 0800) Temp Source: Axillary (08/08 0800) BP: 91/66 mmHg (08/08 1300) Pulse Rate: 102 (08/08 1300) Intake/Output from previous day: 08/07 0701 - 08/08 0700 In: 3031.6 [P.O.:1140; I.V.:1341.6; IV Piggyback:550] Out: 615 [Urine:615] Intake/Output from this shift: Total I/O In: 390.2 [P.O.:60; I.V.:330.2] Out: 75 [Urine:75]  Labs:  Recent Labs  09/17/14 0252 09/18/14 0900 09/19/14 0241 09/19/14 0554  WBC 14.4* 8.9 25.7*  --   HGB 10.3* 13.5 11.8*  --   PLT 151 287 95*  --   CREATININE 1.76* 0.59  --  3.00*   Estimated Creatinine Clearance: 12.8 mL/min (by C-G formula based on Cr of 3).  Recent Labs  09/19/14 1235  [place] 22*     Microbiology: Recent Results (from the past 720 hour(s))  Culture, blood (routine x 2)     Status: None (Preliminary result)   Collection Time: 09/15/14  3:55 PM  Result Value Ref Range Status   Specimen Description BLOOD LEFT HAND  Final   Special Requests BOTTLES DRAWN AEROBIC AND ANAEROBIC 5CC  Final   Culture NO GROWTH 4 DAYS  Final   Report Status PENDING  Incomplete  Culture, blood (routine x 2)     Status: None (Preliminary result)   Collection Time: 09/15/14  4:03 PM  Result Value Ref Range Status   Specimen Description BLOOD RIGHT ANTECUBITAL  Final   Special Requests BOTTLES DRAWN AEROBIC AND ANAEROBIC 5CC  Final   Culture NO GROWTH 4 DAYS  Final   Report Status PENDING   Incomplete  Culture, Urine     Status: None   Collection Time: 09/15/14  7:43 PM  Result Value Ref Range Status   Specimen Description URINE, RANDOM  Final   Special Requests NONE  Final   Culture MULTIPLE SPECIES PRESENT, SUGGEST RECOLLECTION  Final   Report Status 09/17/2014 FINAL  Final  MRSA PCR Screening     Status: None   Collection Time: 09/16/14 12:35 PM  Result Value Ref Range Status   MRSA by PCR NEGATIVE NEGATIVE Final    Comment:        The GeneXpert MRSA Assay (FDA approved for NASAL specimens only), is one component of a comprehensive MRSA colonization surveillance program. It is not intended to diagnose MRSA infection nor to guide or monitor treatment for MRSA infections.   Culture, blood (routine x 2)     Status: None (Preliminary result)   Collection Time: 09/16/14  2:32 PM  Result Value Ref Range Status   Specimen Description BLOOD BLOOD RIGHT HAND  Final   Special Requests BOTTLES DRAWN AEROBIC AND ANAEROBIC 10CC  Final   Culture NO GROWTH 3 DAYS  Final   Report Status PENDING  Incomplete  Culture, blood (routine x 2)     Status: None (Preliminary result)   Collection Time: 09/16/14  2:45 PM  Result Value Ref Range Status   Specimen Description BLOOD BLOOD LEFT HAND  Final   Special  Requests BOTTLES DRAWN AEROBIC AND ANAEROBIC 10CC  Final   Culture NO GROWTH 3 DAYS  Final   Report Status PENDING  Incomplete  Culture, Urine     Status: None   Collection Time: 09/17/14  7:13 PM  Result Value Ref Range Status   Specimen Description URINE, CATHETERIZED  Final   Special Requests NONE  Final   Culture NO GROWTH 2 DAYS  Final   Report Status 09/19/2014 FINAL  Final  Culture, blood (routine x 2)     Status: None (Preliminary result)   Collection Time: 09/18/14  6:39 PM  Result Value Ref Range Status   Specimen Description BLOOD LEFT ANTECUBITAL  Final   Special Requests BOTTLES DRAWN AEROBIC ONLY 10CC  Final   Culture NO GROWTH < 24 HOURS  Final   Report  Status PENDING  Incomplete  Culture, blood (routine x 2)     Status: None (Preliminary result)   Collection Time: 09/18/14  6:46 PM  Result Value Ref Range Status   Specimen Description BLOOD LEFT HAND  Final   Special Requests IN PEDIATRIC BOTTLE 3CC  Final   Culture NO GROWTH < 24 HOURS  Final   Report Status PENDING  Incomplete    Anti-infectives    Start     Dose/Rate Route Frequency Ordered Stop   09/19/14 1800  meropenem (MERREM) 500 mg in sodium chloride 0.9 % 50 mL IVPB     500 mg 100 mL/hr over 30 Minutes Intravenous Every 12 hours 09/19/14 0859     09/18/14 1800  meropenem (MERREM) 1 g in sodium chloride 0.9 % 100 mL IVPB  Status:  Discontinued     1 g 200 mL/hr over 30 Minutes Intravenous Every 12 hours 09/18/14 1707 09/19/14 0859   09/16/14 1200  vancomycin (VANCOCIN) IVPB 750 mg/150 ml premix     750 mg 150 mL/hr over 60 Minutes Intravenous Every 24 hours 09/16/14 1135     09/16/14 1200  piperacillin-tazobactam (ZOSYN) IVPB 3.375 g  Status:  Discontinued     3.375 g 12.5 mL/hr over 240 Minutes Intravenous Every 8 hours 09/16/14 1135 09/18/14 1641   09/15/14 1530  cefTRIAXone (ROCEPHIN) 1 g in dextrose 5 % 50 mL IVPB  Status:  Discontinued     1 g 100 mL/hr over 30 Minutes Intravenous Every 24 hours 09/15/14 1521 09/16/14 1120      Assessment: 81 yo woman admitted 09/15/2014 with DOE, hypotension, fevers, rigors, & leukocytosis with unclear infectious process, possibly abdominal source. Liver mass seen on 8/7 CT. Rigors & sepsis in setting of abscess requiring drainage vs c/f malignancy.  Pharmacy consulted to dose vancomycin & meropenem.  Infectious Disease: afeb, wbc trending up to 25.7 on D#4 abx. Vancomycin trough supratherapeutic at 22ug/ml today. Trough drawn ~24h post dose & considered to be true trough at steady state. Rising creatinine and low uop likely contributing to decreased clearance of vancomycin. CrCl ~12.8 ml/min, not currently on HD.   Vanc 8/5  >> Zosyn 8/5 >>8/7 Meropenem 8/7>>  8/5 BCx: NGTD 8/6 BCx: NGTD 8/5 UCx neg MRSA PCR neg   Goal of Therapy:  Vancomycin trough level 15-20 mcg/ml  Plan:  Decrease vancomycin to 500 mg IV q24h Vancomycin trough on 8/12 if still continuing therapy or earlier if clinically indicated Decrease meropenem to 500 mg IV q12h Follow up culture results  Monitor renal function  Heloise Ochoa, [place].D. PGY2 Cardiology Pharmacy Resident Pager: [phone] 09/19/2014,3:29 PM

## 2014-09-19 NOTE — Progress Notes (Signed)
Following adequate distance.  No new recommendations.

## 2014-09-19 NOTE — Care Management Important Message (Signed)
[phone] Important Message  Patient Details  Name: Sara Russell MRN: [id number] Date of Birth: Feb 23, 1930   Medicare Important Message Given:       Pricilla Handler 09/19/2014, 1:55 PM

## 2014-09-19 NOTE — Progress Notes (Signed)
[facility]    Subjective: Wants to discuss care with Dr Andria Frames  Antibiotics:  Anti-infectives    Start     Dose/Rate Route Frequency Ordered Stop   09/19/14 1800  meropenem (MERREM) 500 mg in sodium chloride 0.9 % 50 mL IVPB     500 mg 100 mL/hr over 30 Minutes Intravenous Every 12 hours 09/19/14 0859     09/18/14 1800  meropenem (MERREM) 1 g in sodium chloride 0.9 % 100 mL IVPB  Status:  Discontinued     1 g 200 mL/hr over 30 Minutes Intravenous Every 12 hours 09/18/14 1707 09/19/14 0859   09/16/14 1200  vancomycin (VANCOCIN) IVPB 750 mg/150 ml premix     750 mg 150 mL/hr over 60 Minutes Intravenous Every 24 hours 09/16/14 1135     09/16/14 1200  piperacillin-tazobactam (ZOSYN) IVPB 3.375 g  Status:  Discontinued     3.375 g 12.5 mL/hr over 240 Minutes Intravenous Every 8 hours 09/16/14 1135 09/18/14 1641   09/15/14 1530  cefTRIAXone (ROCEPHIN) 1 g in dextrose 5 % 50 mL IVPB  Status:  Discontinued     1 g 100 mL/hr over 30 Minutes Intravenous Every 24 hours 09/15/14 1521 09/16/14 1120      Medications: Scheduled Meds: . antiseptic oral rinse  7 mL Mouth Rinse q12n4p  . atorvastatin  10 mg Oral q1800  . meropenem (MERREM) IV  500 mg Intravenous Q12H  . pantoprazole  40 mg Oral Daily  . sodium polystyrene  30 g Oral Once  . vancomycin  750 mg Intravenous Q24H   Continuous Infusions: . sodium chloride Stopped (09/17/14 1723)  . sodium chloride 10 mL/hr at 09/19/14 0800  . sodium chloride    . amiodarone 30 mg/hr (09/19/14 0958)  . diltiazem (CARDIZEM) infusion    . phenylephrine (NEO-SYNEPHRINE) Adult infusion 70 mcg/min (09/19/14 1000)   PRN Meds:.acetaminophen **OR** acetaminophen, morphine injection, naLOXone (NARCAN)  injection    Objective: Weight change:   Intake/Output Summary (Last 24 hours) at 09/19/14 1227 Last data filed at 09/19/14 1000  Gross per 24 hour  Intake 1868.16 ml  Output    315 ml  Net 1553.16 ml    Blood pressure 126/109, pulse 102, temperature 96.8 F (36 C), temperature source Axillary, resp. rate 14, height 5' 1"  (1.549 m), weight 150 lb 2.1 oz (68.1 kg), SpO2 100 %. Temp:  [96.8 F (36 C)-102.8 F (39.3 C)] 96.8 F (36 C) (08/08 0800) Pulse Rate:  [27-159] 102 (08/08 0800) Resp:  [14-39] 14 (08/08 0800) BP: (52-215)/(21-155) 126/109 mmHg (08/08 0800) SpO2:  [85 %-100 %] 100 % (08/08 0800)  Physical Exam: General: Alert and awake, oriented x3, not in any acute distress. HEENT: anicteric sclera, , EOMI CVS regular rate, normal r,  no murmur rubs or gallops Chest: clear to auscultation bilaterally, no wheezing, rales or rhonchi Abdomen: soft nontender, nondistended, normal bowel sounds, Extremities: no  clubbing or edema noted bilaterally Skin: no rashes Neuro: nonfocal  CBC: CBC Latest Ref Rng 09/19/2014 09/18/2014 09/17/2014  WBC 4.0 - 10.5 K/uL 25.7(H) 8.9 14.4(H)  Hemoglobin 12.0 - 15.0 g/dL 11.8(L) 13.5 10.3(L)  Hematocrit 36.0 - 46.0 % 34.1(L) 42.3 30.7(L)  Platelets 150 - 400 K/uL 95(L) 287 151      BMET  Recent Labs  09/18/14 0900 09/19/14 0554  NA 145 131*  K 3.5 5.6*  CL 109 103  CO2 28 15*  GLUCOSE 107* 143*  BUN 26* 36*  CREATININE 0.59 3.00*  CALCIUM 9.0 7.5*     Liver Panel   Recent Labs  09/17/14 0252 09/19/14 0554  PROT  --  5.7*  ALBUMIN 1.8* 1.8*  AST  --  29  ALT  --  19  ALKPHOS  --  174*  BILITOT  --  0.7       Sedimentation Rate No results for input(s): ESRSEDRATE in the last 72 hours. C-Reactive Protein No results for input(s): CRP in the last 72 hours.  Micro Results: Recent Results (from the past 720 hour(s))  Culture, blood (routine x 2)     Status: None (Preliminary result)   Collection Time: 09/15/14  3:55 PM  Result Value Ref Range Status   Specimen Description BLOOD LEFT HAND  Final   Special Requests BOTTLES DRAWN AEROBIC AND ANAEROBIC 5CC  Final   Culture NO GROWTH 3 DAYS  Final   Report Status PENDING   Incomplete  Culture, blood (routine x 2)     Status: None (Preliminary result)   Collection Time: 09/15/14  4:03 PM  Result Value Ref Range Status   Specimen Description BLOOD RIGHT ANTECUBITAL  Final   Special Requests BOTTLES DRAWN AEROBIC AND ANAEROBIC 5CC  Final   Culture NO GROWTH 3 DAYS  Final   Report Status PENDING  Incomplete  Culture, Urine     Status: None   Collection Time: 09/15/14  7:43 PM  Result Value Ref Range Status   Specimen Description URINE, RANDOM  Final   Special Requests NONE  Final   Culture MULTIPLE SPECIES PRESENT, SUGGEST RECOLLECTION  Final   Report Status 09/17/2014 FINAL  Final  MRSA PCR Screening     Status: None   Collection Time: 09/16/14 12:35 PM  Result Value Ref Range Status   MRSA by PCR NEGATIVE NEGATIVE Final    Comment:        The GeneXpert MRSA Assay (FDA approved for NASAL specimens only), is one component of a comprehensive MRSA colonization surveillance program. It is not intended to diagnose MRSA infection nor to guide or monitor treatment for MRSA infections.   Culture, blood (routine x 2)     Status: None (Preliminary result)   Collection Time: 09/16/14  2:32 PM  Result Value Ref Range Status   Specimen Description BLOOD BLOOD RIGHT HAND  Final   Special Requests BOTTLES DRAWN AEROBIC AND ANAEROBIC 10CC  Final   Culture NO GROWTH 2 DAYS  Final   Report Status PENDING  Incomplete  Culture, blood (routine x 2)     Status: None (Preliminary result)   Collection Time: 09/16/14  2:45 PM  Result Value Ref Range Status   Specimen Description BLOOD BLOOD LEFT HAND  Final   Special Requests BOTTLES DRAWN AEROBIC AND ANAEROBIC 10CC  Final   Culture NO GROWTH 2 DAYS  Final   Report Status PENDING  Incomplete  Culture, Urine     Status: None   Collection Time: 09/17/14  7:13 PM  Result Value Ref Range Status   Specimen Description URINE, CATHETERIZED  Final   Special Requests NONE  Final   Culture NO GROWTH 2 DAYS  Final   Report  Status 09/19/2014 FINAL  Final    Studies/Results: Ct Abdomen Pelvis Wo Contrast  09/18/2014   CLINICAL DATA:  SepsisHx basal and squamous cell ca., diverticulosis, GERDSurgery's; D&C, tonsillectomy, appendectomy, breast lumpectomy(benign)  EXAM: CT ABDOMEN AND PELVIS WITHOUT CONTRAST  TECHNIQUE: Multidetector CT imaging of the abdomen  and pelvis was performed following the standard protocol without IV contrast.  COMPARISON:  Pelvic ultrasound, 02/27/2012.  FINDINGS: Lung bases: Small pleural effusions. Lower lobe opacity most consistent with atelectasis. Component of pneumonia is possible but felt likely. 5 mm nodule right lower lobe near the oblique fissure. No pulmonary edema. Heart mildly enlarged. Moderate size hiatal hernia.  Liver: 4.5 cm abnormality, which may reflect a mass, which lies along the posterior margin of the liver at the base caudate lobe and is inseparable from the the inferior vena cava. It also abuts the right adrenal gland. Longitudinal extent of this abnormality is approximately 6 cm.  Liver otherwise unremarkable.  Spleen: Unremarkable.  Gallbladder: Distended. No radiopaque stone. No wall thickening or adjacent inflammation. Common bile duct is dilated to approximate 1 cm but shows normal distal tapering.  Adrenal glands. The left adrenal gland thickening consistent with nodular hyperplasia. Right adrenal gland abuts the above described lesion. Is otherwise unremarkable.  Kidneys, ureters, bladder: 3.2 cm low-density lesion in the midpole of the left kidney consistent with cysts. No other renal masses. Bilateral renal cortical thinning. No stones. Bilateral perinephric stranding, likely chronic. Left ureter is dilated. There is mild prominence of the left renal pelvis without calyx dilation. No ureteral stone. Normal right ureter. Bladder is decompressed by a Foley catheter.  Uterus and adnexa: Uterus is unremarkable. There is a left adnexal water density mass measuring 6.7 x 6.1 x 7.5  cm. This was present on the prior ultrasound and is unchanged in overall size. This could be ovarian origin. It may reflect an inclusion cyst. It stability since the 2014 ultrasound strongly supports a benign etiology.  Lymph nodes:  No enlarged lymph nodes.  Ascites:  None.  Gastrointestinal: No bowel dilation to suggest obstruction. No bowel wall thickening or mesenteric inflammation.  Musculoskeletal: Degenerative changes most evident at L2-L3. Bony structures are demineralized. No osteoblastic or osteolytic lesions.  IMPRESSION: 1. Heterogeneous masslike abnormality along the posterior margin of the liver, centered on the inferior vena cava, from which it is inseparable. It abuts the right adrenal gland. This may have an origin from the posterior margin of the liver or from the inferior vena cava. Adrenal origin is felt unlikely. This could be a neoplastic mass. This would be better evaluated with a contrast-enhanced CT scan or, if the patient can tolerate, MRI with and without contrast. 2. Small bilateral pleural effusions.  Lower lobe atelectasis. 3. Distended gallbladder without convincing acute cholecystitis. 4. No abdominal abscess. 5. Chronic left adnexal cyst measuring 7.5 cm in greatest dimension, which may be ovarian in origin or an inclusion cyst. It is similar in size to the ultrasound dated 02/27/2012. 6. No acute bowel abnormality.   Electronically Signed   By: Lajean Manes M.D.   On: 09/18/2014 12:15   Dg Chest Port 1 View  09/17/2014   CLINICAL DATA:  Respiratory distress.  EXAM: PORTABLE CHEST - 1 VIEW  COMPARISON:  09/16/2014.  FINDINGS: Cardiac silhouette is mildly enlarged. Opacity in the retrocardiac region is likely moderate hiatal hernia. No hilar masses or adenopathy.  No lung consolidation or edema. No pleural effusion or pneumothorax.  Bony thorax is demineralized but grossly intact.  No significant change from the previous day's study.  IMPRESSION: No acute cardiopulmonary disease.    Electronically Signed   By: Lajean Manes M.D.   On: 09/17/2014 19:47      Assessment/Plan:  Active Problems:   HYPERCHOLESTEROLEMIA   HYPERTENSION, BENIGN SYSTEMIC   Pulmonary  embolism and infarction   GERD   A-fib   Hyponatremia   AKI (acute kidney injury)   SOB (shortness of breath)   Elevated troponin   Chronic diastolic congestive heart failure   Elevated WBC count   Urinary tract infectious disease   Blood poisoning   Septic shock    Sara Russell is a 81 y.o. female  with chronic afib, admitted for DOE, hypotension, fevers, rigors, leukocytosis with unclear infectious process. family report hx of having colitis? Recently which possibly could be associated with liver abscess. abd CT suggestive of liver mass/abscess, adjacent to IVC thus creating opportunity for transient bacteremia  #1 Rigors, septic physiology: I WOULD think that this would point towards her having a liver abscess that needs to be drained but perhaps she has a malignancy instead and this is causing her fevers. Certainly translocation of gut bacteria into the bloodstream via the IVC could be a mechanism. Conjecture about pheochromocytoma as well.  Had extensive discussions with the patient her son as well as Dr. Elsworth Soho and Dr Nelda Marseille.  I spent greater than 45 minutes with the patient including greater than 50% of time in face to face counsel of the patient re her septic physiology and her liver mass and in coordination of their care.    LOS: 4 days   Alcide Evener 09/19/2014, 12:27 PM

## 2014-09-20 ENCOUNTER — Inpatient Hospital Stay (HOSPITAL_COMMUNITY): Payer: Medicare Other

## 2014-09-20 DIAGNOSIS — R509 Fever, unspecified: Secondary | ICD-10-CM | POA: Insufficient documentation

## 2014-09-20 DIAGNOSIS — R160 Hepatomegaly, not elsewhere classified: Secondary | ICD-10-CM

## 2014-09-20 LAB — CBC
HCT: 32.2 % — ABNORMAL LOW (ref 36.0–46.0)
Hemoglobin: 11.6 g/dL — ABNORMAL LOW (ref 12.0–15.0)
MCH: 30.6 pg (ref 26.0–34.0)
MCHC: 36 g/dL (ref 30.0–36.0)
MCV: 85 fL (ref 78.0–100.0)
Platelets: 113 10*3/uL — ABNORMAL LOW (ref 150–400)
RBC: 3.79 MIL/uL — ABNORMAL LOW (ref 3.87–5.11)
RDW: 14.1 % (ref 11.5–15.5)
WBC: 20.9 10*3/uL — ABNORMAL HIGH (ref 4.0–10.5)

## 2014-09-20 LAB — BASIC METABOLIC PANEL
Anion gap: 14 (ref 5–15)
BUN: 40 mg/dL — ABNORMAL HIGH (ref 6–20)
CO2: 14 mmol/L — ABNORMAL LOW (ref 22–32)
Calcium: 7.4 mg/dL — ABNORMAL LOW (ref 8.9–10.3)
Chloride: 104 mmol/L (ref 101–111)
Creatinine, Ser: 3.37 mg/dL — ABNORMAL HIGH (ref 0.44–1.00)
GFR calc Af Amer: 14 mL/min — ABNORMAL LOW (ref >60)
GFR calc non Af Amer: 12 mL/min — ABNORMAL LOW (ref >60)
Glucose, Bld: 98 mg/dL (ref 65–99)
Potassium: 4.5 mmol/L (ref 3.5–5.1)
Sodium: 132 mmol/L — ABNORMAL LOW (ref 135–145)

## 2014-09-20 LAB — URINE MICROSCOPIC-ADD ON

## 2014-09-20 LAB — URINALYSIS, ROUTINE W REFLEX MICROSCOPIC
Bilirubin Urine: NEGATIVE
Glucose, UA: NEGATIVE mg/dL
Ketones, ur: NEGATIVE mg/dL
Leukocytes, UA: NEGATIVE
Nitrite: NEGATIVE
Protein, ur: 100 mg/dL — AB
Specific Gravity, Urine: 1.02 (ref 1.005–1.030)
Urobilinogen, UA: 0.2 mg/dL (ref 0.0–1.0)
pH: 5 (ref 5.0–8.0)

## 2014-09-20 LAB — CULTURE, BLOOD (ROUTINE X 2)
Culture: NO GROWTH
Culture: NO GROWTH

## 2014-09-20 LAB — CORTISOL: Cortisol, Plasma: 29.2 ug/dL

## 2014-09-20 LAB — SODIUM, URINE, RANDOM: Sodium, Ur: 42 mmol/L

## 2014-09-20 LAB — CREATININE, URINE, RANDOM: Creatinine, Urine: 72.25 mg/dL

## 2014-09-20 LAB — PHOSPHORUS: Phosphorus: 3.9 mg/dL (ref 2.5–4.6)

## 2014-09-20 LAB — MAGNESIUM: Magnesium: 2 mg/dL (ref 1.7–2.4)

## 2014-09-20 MED ORDER — SODIUM CHLORIDE 0.9 % IV BOLUS (SEPSIS)
500.0000 mL | Freq: Once | INTRAVENOUS | Status: AC
  Administered 2014-09-20: 500 mL via INTRAVENOUS

## 2014-09-20 NOTE — Progress Notes (Signed)
eLink Physician-Brief Progress Note Patient Name: Sara Russell DOB: 26-Apr-1930 MRN: [id number]   Date of Service  09/20/2014  HPI/Events of Note  Tachycardic to 140's in A Fib. No increase in phenylephrine needs. Decreased UOP   eICU Interventions  NS 500cc x 1 now Following MRI abs for final results, decide re IR drainage of possible hepatic abscess     Intervention Category Intermediate Interventions: Arrhythmia - evaluation and management  [name] S. 09/20/2014, 11:46 PM

## 2014-09-20 NOTE — Progress Notes (Signed)
Patient consents to MRI without contrast which is already ordered.  I have talked with her nurse in the attempt to make sure it is done today.  She wants to delay a decision about the biopsy/aspiration until we have the results of the MRI.

## 2014-09-20 NOTE — Care Management Important Message (Signed)
Important Message  Patient Details  Name: Sara Russell MRN: [id number] Date of Birth: [birth year]   Medicare Important Message Given:  Yes-second notification given    Pricilla Handler 09/20/2014, 12:14 PM

## 2014-09-20 NOTE — Progress Notes (Signed)
Patient doing well this AM. Had some hypotensive episodes overnight, however overall looked good. Patient still on phenylephrine drip.  Dr. Andria Frames was in the room this AM to discuss plan of care. Patient would like to get the MRI this AM and then would like to discuss whether to move forward with biopsy or not. CCM following, much appreciated.   Kerrin Mo, MD  Family Medicine.

## 2014-09-20 NOTE — Progress Notes (Signed)
Spoke with MRI, Time slot for 1930 is booked for pt to go down for scan. Family made aware.

## 2014-09-20 NOTE — Progress Notes (Signed)
PULMONARY / CRITICAL CARE MEDICINE   Name: Sara Russell MRN: [id number] DOB: 01-Jul-1930    ADMISSION DATE:  09/15/2014 CONSULTATION DATE:  09/16/14  REFERRING MD :  Family Medicine Service  CHIEF COMPLAINT:  Hypotension  INITIAL PRESENTATION:  Admitted 8/4 with 1 day history of shortness of breath on exertion. Known history of atrial fibrillation on systemic and coagulation with Eliquis. Also has a known history of PE/DVT in 2009. Denies any infectious symptoms or complaints other than dyspnea.  STUDIES:  TTE (8/4) -  EF 45-50%, septal hypokinesis, severely dilated LA, mod/severe TR, PA peak pressure =78mHg Portable CXR (8/5) - No effusion or opacity. Hiatal hernia noted.  SIGNIFICANT EVENTS: 8/4 - admit 8/5 - transfer to icu w/ respiratory failure & a fib w/ rvr 8/6 hypothermic with rigors 8/7 CT ab/pel > heterogenous masslike abnormality along posterior liver, large, unclear if mass or abscess, smsll bilat effusions, distended gallbladder, chronic adnexal cyst  SUBJECTIVE:remains on neo drip More oliguric Wants to eat more  VITAL SIGNS: Temp:  [97.5 F (36.4 C)-98.5 F (36.9 C)] 98 F (36.7 C) (08/09 0739) Pulse Rate:  [26-129] 35 (08/09 0800) Resp:  [13-29] 22 (08/09 1000) BP: (67-157)/(32-95) 127/77 mmHg (08/09 1000) SpO2:  [97 %-100 %] 100 % (08/09 0800) HEMODYNAMICS:   VENTILATOR SETTINGS:   INTAKE / OUTPUT:  Intake/Output Summary (Last 24 hours) at 09/20/14 1026 Last data filed at 09/20/14 0800  Gross per 24 hour  Intake 3102.87 ml  Output    175 ml  Net 2927.87 ml   PHYSICAL EXAMINATION: General:  Awake. Alert. No acute distress.  HENT: NCAT, EOMi PULM: diminished bases, good air movement, clear CV: Irreg irreg, no mgr GI: BS+, soft, nontender all over MSK: normal bulk, tone Neuro: A&Ox4, maew  LABS:  CBC  Recent Labs Lab 09/18/14 0900 09/19/14 0241 09/20/14 0351  WBC 8.9 25.7* 20.9*  HGB 13.5 11.8* 11.6*  HCT 42.3 34.1* 32.2*  PLT 287  95* 113*   Coag's No results for input(s): APTT, INR in the last 168 hours. BMET  Recent Labs Lab 09/18/14 0900 09/19/14 0554 09/20/14 0351  NA 145 131* 132*  K 3.5 5.6* 4.5  CL 109 103 104  CO2 28 15* 14*  BUN 26* 36* 40*  CREATININE 0.59 3.00* 3.37*  GLUCOSE 107* 143* 98   Electrolytes  Recent Labs Lab 09/16/14 1446 09/17/14 0252 09/18/14 0900 09/19/14 0554 09/20/14 0351  CALCIUM  --  7.4* 9.0 7.5* 7.4*  MG 1.3* 2.5*  --   --  2.0  PHOS  --  1.5*  --   --  3.9   Sepsis Markers  Recent Labs Lab 09/16/14 1432 09/16/14 1446 09/16/14 2016 09/17/14 0252 09/17/14 1618 09/18/14 0325  LATICACIDVEN 1.9  --  4.2*  --  3.6*  --   PROCALCITON  --  39.21  --  63.54  --  84.81   ABG  Recent Labs Lab 09/16/14 1100 09/17/14 1842  PHART 7.471* 7.381  PCO2ART 17.6* 22.5*  PO2ART 404* 202.0*   Liver Enzymes  Recent Labs Lab 09/15/14 1100 09/17/14 0252 09/19/14 0554  AST 40  --  29  ALT 21  --  19  ALKPHOS 173*  --  174*  BILITOT 0.9  --  0.7  ALBUMIN 2.5* 1.8* 1.8*   Cardiac Enzymes  Recent Labs Lab 09/16/14 1104 09/16/14 1450 09/17/14 0035  TROPONINI 0.24* 0.22* 0.17*   Glucose No results for input(s): GLUCAP in the last 168 hours.  Imaging No results found.   ASSESSMENT / PLAN:  PULMONARY A:  Acute hypoxic respiratory failure > resolved At risk for volume overload P:   Monitor respiratory status with IVF resuscitation Out of bed as able Titrate O2 for sat of 88-92%.  CARDIOVASCULAR A:  A. fib with RVR > improved rate control Elevated troponin - demand ischemia  Shock - presumed septic as EF normal H/O HTN  H/O HLD P:  Holding B blocker  Neo as needed for MAP > 65 Continue gentle IVF for now but monitor exam for fluid overload Continue Lipitor  RENAL A:   Acute oliguric renal failure - now worsening, oliguric; presumed from sepsis Hyponatremia P:   Continue IVF for now given sepsis picture U/A now, urine Cr/Na Renal  ultrasound Bladder scan Renal consult  GASTROINTESTINAL A:   Hx ulcerative colitis  Ct abd/pelvis with likely infectious mass communicating with the SVC, see ID section. P:   Protonix PO daily Hemoccult stools    HEMATOLOGIC A:   Leukocytosis - resolved Mild Anemia - no signs of active bleeding H/O PE/DVT - 2009  P:  Monitor hemoglobin daily with CBC Holding Eliquis  INFECTIOUS A:   Possible UTI - with rigors concern for abd abscess/ pyelo Ct abd/pelvis with likely infectious mass communicating with the SVC > unclear if neoplasm or infectious but favor infectious  P:   BCx2 8/4>>NTD UC 8/4>>NTD  Abx:  Vancomycin 8/5>>> Zosyn 8/5>>>8/7 Aztreonam 8/7>>>  Plan MRI abdomen today to better characterize the mass, if it looks like an abscess then will have IR evaluate for percutaneous drain  ENDOCRINE A:   No acute issues  P:   Monitor BG on daily labs  NEUROLOGIC A:   No acute issues P:   Monitor mental status   FAMILY  - Updates: Sons at bedside by me 8/9  - Inter-disciplinary family meet or Palliative Care meeting due by:  DNR issued  CC time 40 minutes  Roselie Awkward, MD [facility] Pager: [phone] Cell: [phone] After 3pm or if no response, call [phone]   09/20/2014  10:26 AM

## 2014-09-20 NOTE — Consult Note (Addendum)
Sara Russell is an 81 y.o. female referred by Dr Sara Russell   Chief Complaint: ARF HPI: 81yo female admitted 09/15/14 after presenting to ER with SOB.  Subsequently became hypotensive and thought to be septic from UTI though cultures so far neg.  Scr 0.87 in 2/16 and on admission 2.25, decreased to 1.66 on 09/16/14 then has increased to 3.3 today.  BP low since admission requiring pressors and remained low until yest.  Urine benign except 3-6wbc and 166m protein. UO has decreased to just 200cc yest.  She was benazepril PTA.  CT abd shows mild dilation of lt ureter but no hydro so of ? Significance.  Hx HTN x 131yr  Past Medical History  Diagnosis Date  . S/P dilatation of esophageal stricture 2002  . Hypertension   . Hyperlipidemia   . DVT (deep venous thrombosis) 2009  . PE (pulmonary embolism) 2009  . GERD (gastroesophageal reflux disease)   . Diverticulosis   . Iron deficiency anemia   . Hyperplastic colon polyp   . Positive H. pylori test 01/20/96  . Degenerative joint disease   . Skin cancer     h/o basal and squamous skin cancer--> removed  . Persistent atrial fibrillation   . Shortness of breath dyspnea   . AKI (acute kidney injury) 09/15/2014    Past Surgical History  Procedure Laterality Date  . Dilation and curettage of uterus      for endometrial polyps  . Replacement total knee      left  . Tonsillectomy    . Appendectomy    . Breast lumpectomy      benign    Family History  Problem Relation Age of Onset  . Esophageal cancer Brother   . Diverticulitis Brother   . Colon cancer Neg Hx   . Heart disease Mother   . Stroke Mother   No FH renal ds  Social History:  reports that she quit smoking about 53 years ago. Her smoking use included Cigarettes. She started smoking about 66 years ago. She has a 6.5 pack-year smoking history. She has never used smokeless tobacco. She reports that she drinks alcohol. She reports that she does not use illicit drugs. [place]:  [place]ergies  Allergen Reactions  . Iohexol Other (See Comments)    unknown  . Ivp Dye [Iodinated Diagnostic Agents] Hives  . Scallops [Shellfish Allergy] Other (See Comments)    Projectile vomiting.  . Sulfa Antibiotics Hives  . Sulfamethoxazole Hives    REACTION: unspecified    Medications Prior to Admission  Medication Sig Dispense Refill  . acetaminophen (TYLENOL) 650 MG CR tablet Take 650 mg by mouth every 8 (eight) hours as needed for pain.    . Marland Kitchentorvastatin (LIPITOR) 10 MG tablet TAKE ONE TABLET BY MOUTH ONCE DAILY AT  6PM 90 tablet 3  . benazepril (LOTENSIN) 40 MG tablet TAKE ONE TABLET BY MOUTH ONCE DAILY 90 tablet 3  . calcium carbonate (TUMS - DOSED IN MG ELEMENTAL CALCIUM) 500 MG chewable tablet Chew 1 tablet by mouth 2 (two) times daily.      . calcium-vitamin D (SM CALCIUM-VITAMIN D) 500-200 MG-UNIT per tablet Take 1 tablet by mouth 2 (two) times daily.     . Marland Kitcheniltiazem (CARDIZEM CD) 180 MG 24 hr capsule Take 1 capsule (180 mg total) by mouth daily. 90 capsule 3  . ELIQUIS 5 MG TABS tablet TAKE 1 TABLET TWICE A DAY 180 tablet 0  . ferrous  sulfate 325 (65 FE) MG tablet Take 1 tablet (325 mg total) by mouth daily with breakfast. 1 tablet 0  . metoprolol (LOPRESSOR) 100 MG tablet TAKE ONE TABLET BY MOUTH TWICE DAILY 180 tablet 3  . omeprazole (PRILOSEC) 40 MG capsule TAKE ONE CAPSULE BY MOUTH ONCE DAILY 90 capsule 3  . traMADol (ULTRAM) 50 MG tablet TAKE ONE TABLET BY MOUTH EVERY 6 HOURS AS NEEDED FOR PAIN 180 tablet 1     Lab Results: UA: As above   Recent Labs  09/18/14 0900 09/19/14 0241 09/20/14 0351  WBC 8.9 25.7* 20.9*  HGB 13.5 11.8* 11.6*  HCT 42.3 34.1* 32.2*  PLT 287 95* 113*   BMET  Recent Labs  09/18/14 0900 09/19/14 0554 09/20/14 0351  NA 145 131* 132*  K 3.5 5.6* 4.5  CL 109 103 104  CO2 28 15* 14*  GLUCOSE 107* 143* 98  BUN 26* 36* 40*  CREATININE 0.59 3.00* 3.37*  CALCIUM 9.0 7.5* 7.4*  PHOS  --   --  3.9    LFT  Recent Labs  09/19/14 0554  PROT 5.7*  ALBUMIN 1.8*  AST 29  ALT 19  ALKPHOS 174*  BILITOT 0.7   No results found.  ROS: No change in vision Breathing better No CP No ABD pain Chronic pain Lt ankle No rashes  PHYSICAL EXAM: Blood pressure 124/78, pulse 35, temperature 98.4 F (36.9 C), temperature source Oral, resp. rate 22, height 5' 1"  (1.549 m), weight 68.1 kg (150 lb 2.1 oz), SpO2 98 %. HEENT: PERRLA EOMI NECK:No JVD LUNGS:Few faint basilar crackles CARDIAC:irreg,irreg no MRG ABD:+ BS NTND No HSM EXT:No edema NEURO:CNI Ox3 no asterixis  Assessment: 1. ARF sec to ischemic ATN from hypotension in face of ACE 2. ? Sepsis 3. Liver mass vs abscess 4. A fib 5. Metabolic acidosis PLAN: 1. Try to wean pressors 2. Check cortisol 3. UNa and Ucr already ordered as is SPEP 4. To get MRI to more fully identify liver mass 5. Cont with IV fluids for now 6. Discussed the possibility of HD if renal fx were to worsen and  if that did happen I would be optimistic that renal fx would recover.  She would do temp HD but not long term. 7. Daily Scr 8. If UO does not improve then will give IV lasix 9. Renal US to FU on dilated Lt ureter   Sara Russell T 09/20/2014, 2:00 PM

## 2014-09-20 NOTE — Progress Notes (Signed)
Spoke with MRI, won't be able to take pt to MRI until after 6p. Family and pt and charge RN notified.

## 2014-09-20 NOTE — Progress Notes (Signed)
Pt tachycardic with HR reaching to 130's accompanied with sudden drop in blood pressure. Pt asleep and resting comfortably. Noted that pt would have similar events accompanied with fever and rigors, however pt is afebrile and no rigors present. Elink RN updated when she called to inquire about BP and stated she would forward info to MD. Will continue to monitor pt closely.

## 2014-09-20 NOTE — Progress Notes (Signed)
[facility]    Subjective: No new complaints Antibiotics:  Anti-infectives    Start     Dose/Rate Route Frequency Ordered Stop   09/20/14 1500  vancomycin (VANCOCIN) 500 mg in sodium chloride 0.9 % 100 mL IVPB  Status:  Discontinued     500 mg 100 mL/hr over 60 Minutes Intravenous Every 24 hours 09/19/14 1545 09/20/14 1112   09/19/14 1800  meropenem (MERREM) 500 mg in sodium chloride 0.9 % 50 mL IVPB     500 mg 100 mL/hr over 30 Minutes Intravenous Every 12 hours 09/19/14 0859     09/18/14 1800  meropenem (MERREM) 1 g in sodium chloride 0.9 % 100 mL IVPB  Status:  Discontinued     1 g 200 mL/hr over 30 Minutes Intravenous Every 12 hours 09/18/14 1707 09/19/14 0859   09/16/14 1200  vancomycin (VANCOCIN) IVPB 750 mg/150 ml premix  Status:  Discontinued     750 mg 150 mL/hr over 60 Minutes Intravenous Every 24 hours 09/16/14 1135 09/19/14 1545   09/16/14 1200  piperacillin-tazobactam (ZOSYN) IVPB 3.375 g  Status:  Discontinued     3.375 g 12.5 mL/hr over 240 Minutes Intravenous Every 8 hours 09/16/14 1135 09/18/14 1641   09/15/14 1530  cefTRIAXone (ROCEPHIN) 1 g in dextrose 5 % 50 mL IVPB  Status:  Discontinued     1 g 100 mL/hr over 30 Minutes Intravenous Every 24 hours 09/15/14 1521 09/16/14 1120      Medications: Scheduled Meds: . antiseptic oral rinse  7 mL Mouth Rinse q12n4p  . atorvastatin  10 mg Oral q1800  . meropenem (MERREM) IV  500 mg Intravenous Q12H  . pantoprazole  40 mg Oral Daily   Continuous Infusions: . sodium chloride Stopped (09/17/14 1723)  . sodium chloride 10 mL/hr at 09/19/14 1600  . sodium chloride 75 mL/hr at 09/20/14 0601  . amiodarone 30 mg/hr (09/20/14 0930)  . diltiazem (CARDIZEM) infusion    . phenylephrine (NEO-SYNEPHRINE) Adult infusion 30 mcg/min (09/20/14 1600)   PRN Meds:.acetaminophen **OR** acetaminophen, morphine injection, naLOXone (NARCAN)  injection    Objective: Weight change:    Intake/Output Summary (Last 24 hours) at 09/20/14 1739 Last data filed at 09/20/14 1600  Gross per 24 hour  Intake 3115.39 ml  Output    245 ml  Net 2870.39 ml   Blood pressure 118/68, pulse 35, temperature 98.4 F (36.9 C), temperature source Oral, resp. rate 21, height 5' 1"  (1.549 m), weight 150 lb 2.1 oz (68.1 kg), SpO2 98 %. Temp:  [97.7 F (36.5 C)-98.5 F (36.9 C)] 98.4 F (36.9 C) (08/09 1200) Pulse Rate:  [26-129] 35 (08/09 0800) Resp:  [13-29] 21 (08/09 1500) BP: (67-157)/(32-95) 118/68 mmHg (08/09 1500) SpO2:  [97 %-100 %] 98 % (08/09 1200)  Physical Exam: General: Alert and awake, oriented HEENT: anicteric sclera, , EOMI CVS regular rate, normal r,  no murmur rubs or gallops Chest: clear to auscultation bilaterally, no wheezing, rales or rhonchi Abdomen: soft nontender, nondistended, normal bowel sounds, Extremities: no  clubbing or edema noted bilaterally Skin: no rashes Neuro: nonfocal  CBC: CBC Latest Ref Rng 09/20/2014 09/19/2014 09/18/2014  WBC 4.0 - 10.5 K/uL 20.9(H) 25.7(H) 8.9  Hemoglobin 12.0 - 15.0 g/dL 11.6(L) 11.8(L) 13.5  Hematocrit 36.0 - 46.0 % 32.2(L) 34.1(L) 42.3  Platelets 150 - 400 K/uL 113(L) 95(L) 287      BMET  Recent Labs  09/19/14 0554 09/20/14 0351  NA 131* 132*  K 5.6* 4.5  CL 103 104  CO2 15* 14*  GLUCOSE 143* 98  BUN 36* 40*  CREATININE 3.00* 3.37*  CALCIUM 7.5* 7.4*     Liver Panel   Recent Labs  09/19/14 0554  PROT 5.7*  ALBUMIN 1.8*  AST 29  ALT 19  ALKPHOS 174*  BILITOT 0.7       Sedimentation Rate No results for input(s): ESRSEDRATE in the last 72 hours. C-Reactive Protein No results for input(s): CRP in the last 72 hours.  Micro Results: Recent Results (from the past 720 hour(s))  Culture, blood (routine x 2)     Status: None   Collection Time: 09/15/14  3:55 PM  Result Value Ref Range Status   Specimen Description BLOOD LEFT HAND  Final   Special Requests BOTTLES DRAWN AEROBIC AND ANAEROBIC  5CC  Final   Culture NO GROWTH 5 DAYS  Final   Report Status 09/20/2014 FINAL  Final  Culture, blood (routine x 2)     Status: None   Collection Time: 09/15/14  4:03 PM  Result Value Ref Range Status   Specimen Description BLOOD RIGHT ANTECUBITAL  Final   Special Requests BOTTLES DRAWN AEROBIC AND ANAEROBIC 5CC  Final   Culture NO GROWTH 5 DAYS  Final   Report Status 09/20/2014 FINAL  Final  Culture, Urine     Status: None   Collection Time: 09/15/14  7:43 PM  Result Value Ref Range Status   Specimen Description URINE, RANDOM  Final   Special Requests NONE  Final   Culture MULTIPLE SPECIES PRESENT, SUGGEST RECOLLECTION  Final   Report Status 09/17/2014 FINAL  Final  MRSA PCR Screening     Status: None   Collection Time: 09/16/14 12:35 PM  Result Value Ref Range Status   MRSA by PCR NEGATIVE NEGATIVE Final    Comment:        The GeneXpert MRSA Assay (FDA approved for NASAL specimens only), is one component of a comprehensive MRSA colonization surveillance program. It is not intended to diagnose MRSA infection nor to guide or monitor treatment for MRSA infections.   Culture, blood (routine x 2)     Status: None (Preliminary result)   Collection Time: 09/16/14  2:32 PM  Result Value Ref Range Status   Specimen Description BLOOD BLOOD RIGHT HAND  Final   Special Requests BOTTLES DRAWN AEROBIC AND ANAEROBIC 10CC  Final   Culture NO GROWTH 4 DAYS  Final   Report Status PENDING  Incomplete  Culture, blood (routine x 2)     Status: None (Preliminary result)   Collection Time: 09/16/14  2:45 PM  Result Value Ref Range Status   Specimen Description BLOOD BLOOD LEFT HAND  Final   Special Requests BOTTLES DRAWN AEROBIC AND ANAEROBIC 10CC  Final   Culture NO GROWTH 4 DAYS  Final   Report Status PENDING  Incomplete  Culture, Urine     Status: None   Collection Time: 09/17/14  7:13 PM  Result Value Ref Range Status   Specimen Description URINE, CATHETERIZED  Final   Special  Requests NONE  Final   Culture NO GROWTH 2 DAYS  Final   Report Status 09/19/2014 FINAL  Final  Culture, blood (routine x 2)     Status: None (Preliminary result)   Collection Time: 09/18/14  6:39 PM  Result Value Ref Range Status   Specimen Description BLOOD LEFT ANTECUBITAL  Final   Special Requests BOTTLES DRAWN AEROBIC ONLY  10CC  Final   Culture NO GROWTH 2 DAYS  Final   Report Status PENDING  Incomplete  Culture, blood (routine x 2)     Status: None (Preliminary result)   Collection Time: 09/18/14  6:46 PM  Result Value Ref Range Status   Specimen Description BLOOD LEFT HAND  Final   Special Requests IN PEDIATRIC BOTTLE 3CC  Final   Culture NO GROWTH 2 DAYS  Final   Report Status PENDING  Incomplete    Studies/Results: No results found.    Assessment/Plan:  Active Problems:   HYPERCHOLESTEROLEMIA   HYPERTENSION, BENIGN SYSTEMIC   Pulmonary embolism and infarction   GERD   A-fib   Hyponatremia   AKI (acute kidney injury)   SOB (shortness of breath)   Elevated troponin   Chronic diastolic congestive heart failure   Elevated WBC count   Urinary tract infectious disease   Blood poisoning   Septic shock   Liver mass, left lobe    GENAVIVE KUBICKI is a 81 y.o. female  with chronic afib, admitted for DOE, hypotension, fevers, rigors, leukocytosis with unclear infectious process. family report hx of having colitis? Recently which possibly could be associated with liver abscess. abd CT suggestive of liver mass/abscess, adjacent to IVC thus creating opportunity for transient bacteremia  #1 Rigors, septic physiology: I WOULD think that this would point towards her having a liver abscess that needs to be drained but perhaps she has a malignancy instead and this is causing her fevers. Certainly translocation of gut bacteria into the bloodstream via the IVC could be a mechanism. Conjecture about pheochromocytoma as well. She remains on pressors now and with AKI  I had not  last shoes on vancomycin. I do not think she needs that given that we are again going with a presumptive intra-abdominal infection and the vancomycin would only add coverage for ampicillin resistant enterococcus. Will discontinue the vancomycin and continue her on broad-spectrum meropenem  I agree with MRI to elucidate liver/adrenal pathology     LOS: 5 days   Alcide Evener 09/20/2014, 5:39 PM

## 2014-09-21 ENCOUNTER — Inpatient Hospital Stay (HOSPITAL_COMMUNITY): Payer: Medicare Other

## 2014-09-21 ENCOUNTER — Encounter (HOSPITAL_COMMUNITY): Payer: Self-pay | Admitting: *Deleted

## 2014-09-21 LAB — PROTEIN ELECTROPHORESIS, SERUM
A/G Ratio: 0.7 (ref 0.7–1.7)
Albumin ELP: 1.8 g/dL — ABNORMAL LOW (ref 2.9–4.4)
Alpha-1-Globulin: 0.3 g/dL (ref 0.0–0.4)
Alpha-2-Globulin: 0.9 g/dL (ref 0.4–1.0)
Beta Globulin: 0.7 g/dL (ref 0.7–1.3)
Gamma Globulin: 0.9 g/dL (ref 0.4–1.8)
Globulin, Total: 2.7 g/dL (ref 2.2–3.9)
Total Protein ELP: 4.5 g/dL — ABNORMAL LOW (ref 6.0–8.5)

## 2014-09-21 LAB — CBC WITH DIFFERENTIAL/PLATELET
Basophils Absolute: 0 10*3/uL (ref 0.0–0.1)
Basophils Relative: 0 % (ref 0–1)
Eosinophils Absolute: 0 10*3/uL (ref 0.0–0.7)
Eosinophils Relative: 0 % (ref 0–5)
HCT: 33 % — ABNORMAL LOW (ref 36.0–46.0)
Hemoglobin: 11.3 g/dL — ABNORMAL LOW (ref 12.0–15.0)
Lymphocytes Relative: 2 % — ABNORMAL LOW (ref 12–46)
Lymphs Abs: 0.3 10*3/uL — ABNORMAL LOW (ref 0.7–4.0)
MCH: 29.6 pg (ref 26.0–34.0)
MCHC: 34.2 g/dL (ref 30.0–36.0)
MCV: 86.4 fL (ref 78.0–100.0)
Monocytes Absolute: 0.5 10*3/uL (ref 0.1–1.0)
Monocytes Relative: 3 % (ref 3–12)
Neutro Abs: 13.2 10*3/uL — ABNORMAL HIGH (ref 1.7–7.7)
Neutrophils Relative %: 94 % — ABNORMAL HIGH (ref 43–77)
Platelets: 92 10*3/uL — ABNORMAL LOW (ref 150–400)
RBC: 3.82 MIL/uL — ABNORMAL LOW (ref 3.87–5.11)
RDW: 14.4 % (ref 11.5–15.5)
WBC: 14 10*3/uL — ABNORMAL HIGH (ref 4.0–10.5)

## 2014-09-21 LAB — BASIC METABOLIC PANEL
Anion gap: 14 (ref 5–15)
BUN: 42 mg/dL — ABNORMAL HIGH (ref 6–20)
CO2: 13 mmol/L — ABNORMAL LOW (ref 22–32)
Calcium: 7.3 mg/dL — ABNORMAL LOW (ref 8.9–10.3)
Chloride: 107 mmol/L (ref 101–111)
Creatinine, Ser: 3.86 mg/dL — ABNORMAL HIGH (ref 0.44–1.00)
GFR calc Af Amer: 12 mL/min — ABNORMAL LOW (ref >60)
GFR calc non Af Amer: 10 mL/min — ABNORMAL LOW (ref >60)
Glucose, Bld: 83 mg/dL (ref 65–99)
Potassium: 4.2 mmol/L (ref 3.5–5.1)
Sodium: 134 mmol/L — ABNORMAL LOW (ref 135–145)

## 2014-09-21 LAB — CULTURE, BLOOD (ROUTINE X 2)
Culture: NO GROWTH
Culture: NO GROWTH

## 2014-09-21 MED ORDER — FUROSEMIDE 10 MG/ML IJ SOLN
160.0000 mg | Freq: Four times a day (QID) | INTRAVENOUS | Status: DC
  Administered 2014-09-21 – 2014-09-22 (×4): 160 mg via INTRAVENOUS
  Filled 2014-09-21 (×5): qty 16

## 2014-09-21 MED ORDER — AMIODARONE HCL 200 MG PO TABS
400.0000 mg | ORAL_TABLET | Freq: Two times a day (BID) | ORAL | Status: DC
  Administered 2014-09-21 (×2): 400 mg via ORAL
  Filled 2014-09-21 (×4): qty 2

## 2014-09-21 MED ORDER — METOPROLOL TARTRATE 1 MG/ML IV SOLN: 2.5000 mg | Freq: Four times a day (QID) | INTRAVENOUS | Status: DC | PRN

## 2014-09-21 MED ORDER — SODIUM CHLORIDE 0.9 % IV SOLN: 500.0000 mg | INTRAVENOUS | Status: DC

## 2014-09-21 MED ORDER — STERILE WATER FOR INJECTION IV SOLN
INTRAVENOUS | Status: DC
  Administered 2014-09-21 – 2014-09-23 (×2): via INTRAVENOUS
  Filled 2014-09-21 (×5): qty 850

## 2014-09-21 MED ORDER — SODIUM CHLORIDE 0.9 % IV SOLN
500.0000 mg | INTRAVENOUS | Status: DC
  Administered 2014-09-22: 500 mg via INTRAVENOUS
  Filled 2014-09-21: qty 0.5

## 2014-09-21 NOTE — Progress Notes (Signed)
Patient Name: Sara Russell Date of Encounter: 09/21/2014     Active Problems:   HYPERCHOLESTEROLEMIA   HYPERTENSION, BENIGN SYSTEMIC   Pulmonary embolism and infarction   GERD   A-fib   Hyponatremia   AKI (acute kidney injury)   SOB (shortness of breath)   Elevated troponin   Chronic diastolic congestive heart failure   Elevated WBC count   Urinary tract infectious disease   Blood poisoning   Septic shock   Liver mass, left lobe   FUO (fever of unknown origin)    SUBJECTIVE  The patient has a history of chronic atrial fibrillation.  Prior to this admission she had been on Apixaban but no longer qualifies for that because of her renal insufficiency.  She also has thrombocytopenia with platelet count of 92,000.  She has a solid mass adjacent to her liver felt likely to be cancer. With any activity or heart rate has been increasing.  CURRENT MEDS . amiodarone  400 mg Oral BID  . atorvastatin  10 mg Oral q1800  . furosemide  160 mg Intravenous Q6H  . [START ON 09/22/2014] meropenem (MERREM) IV  500 mg Intravenous Q24H  . pantoprazole  40 mg Oral Daily    OBJECTIVE  Filed Vitals:   09/21/14 1045 09/21/14 1100 09/21/14 1115 09/21/14 1130  BP: 118/55 119/68 92/51 128/60  Pulse: 129 128 125 132  Temp:    97.9 F (36.6 C)  TempSrc:    Oral  Resp: 16 15 16 20   Height:      Weight:      SpO2: 97% 97% 98% 97%    Intake/Output Summary (Last 24 hours) at 09/21/14 1150 Last data filed at 09/21/14 1100  Gross per 24 hour  Intake 2814.87 ml  Output    287 ml  Net 2527.87 ml   Filed Weights   09/15/14 1504 09/16/14 0547 09/16/14 1215  Weight: 148 lb 13 oz (67.5 kg) 153 lb 1.6 oz (69.446 kg) 150 lb 2.1 oz (68.1 kg)    PHYSICAL EXAM  General: Pleasant, NAD.  Nasal oxygen in place Neuro: Alert and oriented X 3. Moves all extremities spontaneously. Psych: Normal affect. HEENT:  Normal  Neck: Supple without bruits or JVD. Lungs:  Resp regular and unlabored,  CTA. Heart: Rapid irregular rhythm Abdomen: Soft, non-tender, non-distended, BS + x 4.  Extremities: No clubbing, cyanosis or edema. DP/PT/Radials 2+ and equal bilaterally.  Accessory Clinical Findings  CBC  Recent Labs  09/20/14 0351 09/21/14 0246  WBC 20.9* 14.0*  NEUTROABS  --  13.2*  HGB 11.6* 11.3*  HCT 32.2* 33.0*  MCV 85.0 86.4  PLT 113* 92*   Basic Metabolic Panel  Recent Labs  09/20/14 0351 09/21/14 0246  NA 132* 134*  K 4.5 4.2  CL 104 107  CO2 14* 13*  GLUCOSE 98 83  BUN 40* 42*  CREATININE 3.37* 3.86*  CALCIUM 7.4* 7.3*  MG 2.0  --   PHOS 3.9  --    Liver Function Tests  Recent Labs  09/19/14 0554  AST 29  ALT 19  ALKPHOS 174*  BILITOT 0.7  PROT 5.7*  ALBUMIN 1.8*   No results for input(s): LIPASE, AMYLASE in the last 72 hours. Cardiac Enzymes No results for input(s): CKTOTAL, CKMB, CKMBINDEX, TROPONINI in the last 72 hours. BNP Invalid input(s): POCBNP D-Dimer No results for input(s): DDIMER in the last 72 hours. Hemoglobin A1C No results for input(s): HGBA1C in the last 72 hours. Fasting Lipid Panel  No results for input(s): CHOL, HDL, LDLCALC, TRIG, CHOLHDL, LDLDIRECT in the last 72 hours. Thyroid Function Tests No results for input(s): TSH, T4TOTAL, T3FREE, THYROIDAB in the last 72 hours.  Invalid input(s): FREET3  TELE  Atrial fibrillation with rapid ventricular response  ECG    Radiology/Studies  Ct Abdomen Pelvis Wo Contrast  09/18/2014   CLINICAL DATA:  SepsisHx basal and squamous cell ca., diverticulosis, GERDSurgery's; D&C, tonsillectomy, appendectomy, breast lumpectomy(benign)  EXAM: CT ABDOMEN AND PELVIS WITHOUT CONTRAST  TECHNIQUE: Multidetector CT imaging of the abdomen and pelvis was performed following the standard protocol without IV contrast.  COMPARISON:  Pelvic ultrasound, 02/27/2012.  FINDINGS: Lung bases: Small pleural effusions. Lower lobe opacity most consistent with atelectasis. Component of pneumonia is  possible but felt likely. 5 mm nodule right lower lobe near the oblique fissure. No pulmonary edema. Heart mildly enlarged. Moderate size hiatal hernia.  Liver: 4.5 cm abnormality, which may reflect a mass, which lies along the posterior margin of the liver at the base caudate lobe and is inseparable from the the inferior vena cava. It also abuts the right adrenal gland. Longitudinal extent of this abnormality is approximately 6 cm.  Liver otherwise unremarkable.  Spleen: Unremarkable.  Gallbladder: Distended. No radiopaque stone. No wall thickening or adjacent inflammation. Common bile duct is dilated to approximate 1 cm but shows normal distal tapering.  Adrenal glands. The left adrenal gland thickening consistent with nodular hyperplasia. Right adrenal gland abuts the above described lesion. Is otherwise unremarkable.  Kidneys, ureters, bladder: 3.2 cm low-density lesion in the midpole of the left kidney consistent with cysts. No other renal masses. Bilateral renal cortical thinning. No stones. Bilateral perinephric stranding, likely chronic. Left ureter is dilated. There is mild prominence of the left renal pelvis without calyx dilation. No ureteral stone. Normal right ureter. Bladder is decompressed by a Foley catheter.  Uterus and adnexa: Uterus is unremarkable. There is a left adnexal water density mass measuring 6.7 x 6.1 x 7.5 cm. This was present on the prior ultrasound and is unchanged in overall size. This could be ovarian origin. It may reflect an inclusion cyst. It stability since the 2014 ultrasound strongly supports a benign etiology.  Lymph nodes:  No enlarged lymph nodes.  Ascites:  None.  Gastrointestinal: No bowel dilation to suggest obstruction. No bowel wall thickening or mesenteric inflammation.  Musculoskeletal: Degenerative changes most evident at L2-L3. Bony structures are demineralized. No osteoblastic or osteolytic lesions.  IMPRESSION: 1. Heterogeneous masslike abnormality along the  posterior margin of the liver, centered on the inferior vena cava, from which it is inseparable. It abuts the right adrenal gland. This may have an origin from the posterior margin of the liver or from the inferior vena cava. Adrenal origin is felt unlikely. This could be a neoplastic mass. This would be better evaluated with a contrast-enhanced CT scan or, if the patient can tolerate, MRI with and without contrast. 2. Small bilateral pleural effusions.  Lower lobe atelectasis. 3. Distended gallbladder without convincing acute cholecystitis. 4. No abdominal abscess. 5. Chronic left adnexal cyst measuring 7.5 cm in greatest dimension, which may be ovarian in origin or an inclusion cyst. It is similar in size to the ultrasound dated 02/27/2012. 6. No acute bowel abnormality.   Electronically Signed   By: Lajean Manes M.D.   On: 09/18/2014 12:15   Dg Chest 2 View  09/15/2014   CLINICAL DATA:  Shortness of breath. Pain when breathing for 2 days.  EXAM: CHEST  2  VIEW  COMPARISON:  03/20/2014  FINDINGS: Heart size is mildly enlarged. Moderate hiatal hernia present. There are no focal consolidations. No pleural effusions. No pulmonary edema. Mild scoliosis.  IMPRESSION: 1. Cardiomegaly without edema. 2. Hiatal hernia.   Electronically Signed   By: Nolon Nations M.D.   On: 09/15/2014 11:31   Mr Liver Wo Contrast  09/21/2014   CLINICAL DATA:  Liver mass on CT  EXAM: MRI ABDOMEN WITHOUT CONTRAST  TECHNIQUE: Multiplanar multisequence MR imaging was performed without the administration of intravenous contrast.  COMPARISON:  CT abdomen pelvis dated 09/18/2014  FINDINGS: Severely motion degraded images.  Lower chest: Small bilateral pleural effusions. Associated bilateral lower lobe opacities, likely atelectasis.  Hepatobiliary: Liver is grossly unremarkable. Specifically, no mass is seen. The CT abnormality is distinct from the right liver.  Layering gallstones versus gallbladder sludge (series 5/ image 23). No  associated inflammatory changes.  No intrahepatic ductal dilatation. Common duct is mildly dilated with a suspected 5 mm distal CBD stone (series 5/ image 29).  Pancreas: Pancreatic atrophy. Pancreas is poorly evaluated due to motion degradation.  Spleen: Within normal limits.  Adrenals/Urinary Tract: Thickening of the left adrenal gland without discrete mass (series 5/image 26).  Right adrenal gland is incompletely visualized but mildly thickened (series 5/image 23).  2.0 x 2.5 cm well-circumscribed T2 hyperintense mass abutting the anterior aspect of the right adrenal gland and displacing the IVC anteriorly/laterally (series 5/image 20). It is unclear whether the mass is of primary adrenal origin or possibly originates along the posterior wall of the infrahepatic IVC. The lesion is new from 2011 but is poorly characterized on this motion degraded unenhanced MRI. Primary differential considerations include pheochromocytoma or sarcoma. If this is adrenal in etiology, metastasis would also be in the differential in the setting of a known primary neoplasm.  3.0 cm left lower pole renal cyst (series 11/ image 33). Additional subcentimeter right lower pole renal cyst (series 11/ image 34). Bilateral renal scarring. No hydronephrosis.  Stomach/Bowel: Stomach is notable for a moderate to large hiatal hernia.  Visualized bowel is poorly evaluated/ grossly unremarkable.  Vascular/Lymphatic: No evidence of abdominal aortic aneurysm.  Infrahepatic IVC is effaced/displaced anteriorly, as described above.  No gross abdominal lymphadenopathy.  Other: No abdominal ascites.  Retroperitoneal standing.  Moderate body wall edema.  Musculoskeletal: Degenerative changes of the visualized thoracolumbar spine.  IMPRESSION: Severely motion degraded images. Evaluation is also limited due to lack of intravenous contrast administration.  2.5 cm well-circumscribed T2 hyperintense mass located between the intrahepatic IVC and right adrenal  gland, as described above, exact organ of origin unclear. Primary differential considerations include pheochromocytoma, sarcoma, or possibly metastasis.  Liver is grossly unremarkable.  Additional ancillary findings as above.   Electronically Signed   By: Julian Hy M.D.   On: 09/21/2014 08:20   Dg Chest Port 1 View  09/21/2014   CLINICAL DATA:  Acute renal insufficiency, pulmonary embolism and infarction, atrial fibrillation, CHF.  EXAM: PORTABLE CHEST - 1 VIEW  COMPARISON:  Portable chest x-ray of September 17, 2014 and PA and lateral chest x-ray of August 05, 2013  FINDINGS: The lungs are mildly hypoinflated greater on the right than on the left. The cardiac silhouette is enlarged. The pulmonary vascularity is not engorged. A trace of pleural fluid may be present on the right. There is no alveolar infiltrate. There is density in the retrocardiac region which likely reflects a hiatal hernia given the appearance on the previous studies. The observed bony thorax  is unremarkable.  IMPRESSION: There is mild hypoinflation greater on the right than on the left. There is no acute cardiopulmonary abnormality.   Electronically Signed   By: David  Martinique M.D.   On: 09/21/2014 07:22   Dg Chest Port 1 View  09/17/2014   CLINICAL DATA:  Respiratory distress.  EXAM: PORTABLE CHEST - 1 VIEW  COMPARISON:  09/16/2014.  FINDINGS: Cardiac silhouette is mildly enlarged. Opacity in the retrocardiac region is likely moderate hiatal hernia. No hilar masses or adenopathy.  No lung consolidation or edema. No pleural effusion or pneumothorax.  Bony thorax is demineralized but grossly intact.  No significant change from the previous day's study.  IMPRESSION: No acute cardiopulmonary disease.   Electronically Signed   By: Lajean Manes M.D.   On: 09/17/2014 19:47   Dg Chest Port 1 View  09/16/2014   CLINICAL DATA:  Shortness of Breath  EXAM: PORTABLE CHEST - 1 VIEW  COMPARISON:  September 15, 2014  FINDINGS: There is no edema or  consolidation. Heart is upper normal in size with pulmonary vascularity within normal limits. Moderate hiatal hernia is present. There is atherosclerotic change in the aorta. No adenopathy. No bone lesions.  IMPRESSION: No edema or consolidation. Moderate hiatal hernia. Atherosclerotic calcification in aorta.   Electronically Signed   By: Lowella Grip III M.D.   On: 09/16/2014 11:20    ASSESSMENT AND PLAN  1.  Chronic atrial fibrillation with rapid ventricular response 2.  Hypotension, improving.  Neo-Synephrine being tapered. 3.  Renal insufficiency 4.  Perihepatic mass, likely cancer  Plan: We will switch her to oral amiodarone today to help with rate control.  If her blood pressure allows, we will try to supplement with small dose of IV metoprolol when necessary for heart rate greater than 120.  Signed, Warren Danes MD

## 2014-09-21 NOTE — Progress Notes (Signed)
Long discussion. First, the good news.  In most ways, (not renal function yet) she is looking and feeling better.  It is becoming more likely that she will survive this acute insult.  Now the bad news.  The perihepatic mass is solid, not abscess.  While the mass is well circumscribed, it is more likely cancerous rather than benign.  Patient and three sons were informed.  To cut to the chase, patient has not made a final, irrevocable decision, but is STRONGLY leaning toward a palliative approach.  To be clear, she wants to treat the acute infection (or presumed infection) for the chance of short term recovery and return to home.  She does not want biopsy because she would not want surgery or chemotherapy.    I recommend: 1. Continue current treatments.   2. Consult palliative care. 3. Consider urine catacholamines since pheo is in differential.  My hope is that we can treat this acute problem and that she can return home for quality time.  Family aware that a benign etiology is possible but less likely.

## 2014-09-21 NOTE — Evaluation (Signed)
Physical Therapy Evaluation Patient Details Name: CHANELE DOUGLAS MRN: [id number] DOB: [birth year] Today's Date: 09/21/2014   History of Present Illness  Admitted 8/4 with 1 day history of shortness of breath on exertion. Known history of atrial fibrillation on systemic and coagulation with Eliquis. Also has a known history of PE/DVT in 2009. Denies any infectious symptoms or complaints other than dyspnea  Clinical Impression  Pt admitted with above diagnosis. Pt currently with functional limitations due to the deficits listed below (see PT Problem List). Pt was able to stand but is very deconditioned and could only weight shift and take shuffle steps with poor balance.  Will benefit from Rehab stay to reach prior functional level.  Discussed d/c plan at length with pt and sons.  Pt had just signed a contract and paid deposit to move into [facility] as her home is split level and too difficult to negotiate.  Pt signed papers Wed. and was admitted to hospital Thurs.  Pt is willing to go to Rehab and then will go to her apartment at [facility].   Feel pt could tolerate Rehab and would be prepared to go to [facility] once Rehab stay completed.  Sons are supportive with one living here in [place], one in [place] and one in [place], [place].  They have had her stay with them before when needed and are willing to do what they need to for their mom.  Will follow acutely.    Pt will benefit from skilled PT to increase their independence and safety with mobility to allow discharge to the venue listed below.      Follow Up Recommendations CIR;Supervision - Intermittent    Equipment Recommendations  Other (comment) (TBA)    Recommendations for Other Services Rehab consult     Precautions / Restrictions Precautions Precautions: Fall Restrictions Weight Bearing Restrictions: No      Mobility  Bed Mobility               General bed mobility comments: in chair on  arrival  Transfers Overall transfer level: Needs assistance Equipment used: 2 person hand held assist Transfers: Sit to/from Stand Sit to Stand: Mod assist;+2 physical assistance         General transfer comment: Pt used hands to power up but needed mod assist to physically stand all the way up.    Ambulation/Gait                Stairs            Wheelchair Mobility    Modified Rankin (Stroke Patients Only)       Balance Overall balance assessment: Needs assistance;History of Falls         Standing balance support: Bilateral upper extremity supported;During functional activity Standing balance-Leahy Scale: Poor Standing balance comment: Pt stood for up a minute with bil UE support by two staff.  Pt was able to weight shift bil but pressing down on bil UEs quite a bit.  Took two shuffle steps forward and two back.  Poor balance reactions.                               Pertinent Vitals/Pain Pain Assessment: No/denies pain  HR fluctuating due to afib but nurse aware.  Other VSS.     Home Living Family/patient expects to be discharged to:: Private residence Living Arrangements: Alone Available Help at Discharge: Family;Available 24 hours/day (sons  can help, pt has d/c'd to their homes in past prn) Type of Home: House Home Access: Stairs to enter Entrance Stairs-Rails: Can reach both;Left;Right Entrance Stairs-Number of Steps: 4 Home Layout: Multi-level;1/2 bath on main level;Bed/bath upstairs Home Equipment: Walker - 2 wheels;Bedside commode;Cane - single point Additional Comments: See comments section above for info about pt d/c plan.    Prior Function Level of Independence: Independent               Hand Dominance   Dominant Hand: Right    Extremity/Trunk Assessment   Upper Extremity Assessment: Defer to OT evaluation           Lower Extremity Assessment: RLE deficits/detail;LLE deficits/detail RLE Deficits / Details:  grossly 3-/5 LLE Deficits / Details: grossly 2+/5, left foot externally rotated with DF limited 1/5 movement  Cervical / Trunk Assessment: Kyphotic  Communication   Communication: No difficulties  Cognition Arousal/Alertness: Awake/alert Behavior During Therapy: WFL for tasks assessed/performed Overall Cognitive Status: Within Functional Limits for tasks assessed                      General Comments      Exercises General Exercises - Lower Extremity Ankle Circles/Pumps: AROM;Both;5 reps;Supine Heel Slides: AROM;Both;5 reps;Supine      Assessment/Plan    PT Assessment Patient needs continued PT services  PT Diagnosis Generalized weakness   PT Problem List Decreased activity tolerance;Decreased balance;Decreased mobility;Decreased knowledge of use of DME;Decreased safety awareness;Decreased knowledge of precautions;Decreased strength;Decreased range of motion  PT Treatment Interventions DME instruction;Gait training;Therapeutic activities;Functional mobility training;Therapeutic exercise;Balance training;Patient/family education   PT Goals (Current goals can be found in the Care Plan section) Acute Rehab PT Goals Patient Stated Goal: to be independent PT Goal Formulation: With patient Time For Goal Achievement: 10/05/14 Potential to Achieve Goals: Good    Frequency Min 3X/week   Barriers to discharge        Co-evaluation               End of Session Equipment Utilized During Treatment: Gait belt Activity Tolerance: Patient limited by fatigue;Patient limited by lethargy Patient left: in chair;with call bell/phone within reach;with family/visitor present Nurse Communication: Mobility status         Time: 2446-9507 PT Time Calculation (min) (ACUTE ONLY): 38 min   Charges:   PT Evaluation $Initial PT Evaluation Tier I: 1 Procedure PT Treatments $Gait Training: 8-22 mins $Therapeutic Exercise: 8-22 mins   PT G CodesDenice Paradise October 06, 2014, 2:35 PM Layten Aiken,PT Acute Rehabilitation [phone] (pager)

## 2014-09-21 NOTE — Progress Notes (Signed)
Patient doing well this AM. Patient still on phenylephrine drip.Dr. Andria Frames discussed MRI this morning CCM following, much appreciated.   Kerrin Mo, MD  Family Medicine.

## 2014-09-21 NOTE — Progress Notes (Signed)
Rehab Admissions Coordinator Note:  Patient was screened by Naylin Burkle L for appropriateness for an Inpatient Acute Rehab Consult.  At this time, we are recommending Inpatient Rehab consult.  Mende Biswell L 09/21/2014, 3:22 PM  I can be reached at [phone].

## 2014-09-21 NOTE — Progress Notes (Signed)
S: Sitting up in chair.  No new CO O:BP 111/67 mmHg  Pulse 78  Temp(Src) 97.3 F (36.3 C) (Oral)  Resp 13  Ht 5' 1"  (1.549 m)  Wt 68.1 kg (150 lb 2.1 oz)  BMI 28.38 kg/m2  SpO2 96%  Intake/Output Summary (Last 24 hours) at 09/21/14 0840 Last data filed at 09/21/14 0700  Gross per 24 hour  Intake 2521.3 ml  Output    317 ml  Net 2204.3 ml   Weight change:  RCB:ULAGT and alert XMI:WOEHO, irreg Resp:Few faint basilar crackles Abd:+ BS NTND Ext:tr edema NEURO:CNI Ox3 no asterixis   . atorvastatin  10 mg Oral q1800  . [START ON 09/22/2014] meropenem (MERREM) IV  500 mg Intravenous Q24H  . pantoprazole  40 mg Oral Daily   Mr Liver Wo Contrast  09/21/2014   CLINICAL DATA:  Liver mass on CT  EXAM: MRI ABDOMEN WITHOUT CONTRAST  TECHNIQUE: Multiplanar multisequence MR imaging was performed without the administration of intravenous contrast.  COMPARISON:  CT abdomen pelvis dated 09/18/2014  FINDINGS: Severely motion degraded images.  Lower chest: Small bilateral pleural effusions. Associated bilateral lower lobe opacities, likely atelectasis.  Hepatobiliary: Liver is grossly unremarkable. Specifically, no mass is seen. The CT abnormality is distinct from the right liver.  Layering gallstones versus gallbladder sludge (series 5/ image 23). No associated inflammatory changes.  No intrahepatic ductal dilatation. Common duct is mildly dilated with a suspected 5 mm distal CBD stone (series 5/ image 29).  Pancreas: Pancreatic atrophy. Pancreas is poorly evaluated due to motion degradation.  Spleen: Within normal limits.  Adrenals/Urinary Tract: Thickening of the left adrenal gland without discrete mass (series 5/image 26).  Right adrenal gland is incompletely visualized but mildly thickened (series 5/image 23).  2.0 x 2.5 cm well-circumscribed T2 hyperintense mass abutting the anterior aspect of the right adrenal gland and displacing the IVC anteriorly/laterally (series 5/image 20). It is unclear  whether the mass is of primary adrenal origin or possibly originates along the posterior wall of the infrahepatic IVC. The lesion is new from 2011 but is poorly characterized on this motion degraded unenhanced MRI. Primary differential considerations include pheochromocytoma or sarcoma. If this is adrenal in etiology, metastasis would also be in the differential in the setting of a known primary neoplasm.  3.0 cm left lower pole renal cyst (series 11/ image 33). Additional subcentimeter right lower pole renal cyst (series 11/ image 34). Bilateral renal scarring. No hydronephrosis.  Stomach/Bowel: Stomach is notable for a moderate to large hiatal hernia.  Visualized bowel is poorly evaluated/ grossly unremarkable.  Vascular/Lymphatic: No evidence of abdominal aortic aneurysm.  Infrahepatic IVC is effaced/displaced anteriorly, as described above.  No gross abdominal lymphadenopathy.  Other: No abdominal ascites.  Retroperitoneal standing.  Moderate body wall edema.  Musculoskeletal: Degenerative changes of the visualized thoracolumbar spine.  IMPRESSION: Severely motion degraded images. Evaluation is also limited due to lack of intravenous contrast administration.  2.5 cm well-circumscribed T2 hyperintense mass located between the intrahepatic IVC and right adrenal gland, as described above, exact organ of origin unclear. Primary differential considerations include pheochromocytoma, sarcoma, or possibly metastasis.  Liver is grossly unremarkable.  Additional ancillary findings as above.   Electronically Signed   By: Julian Hy M.D.   On: 09/21/2014 08:20   Dg Chest Port 1 View  09/21/2014   CLINICAL DATA:  Acute renal insufficiency, pulmonary embolism and infarction, atrial fibrillation, CHF.  EXAM: PORTABLE CHEST - 1 VIEW  COMPARISON:  Portable chest x-ray of  September 17, 2014 and [place] and lateral chest x-ray of August 05, 2013  FINDINGS: The lungs are mildly hypoinflated greater on the right than on the left. The  cardiac silhouette is enlarged. The pulmonary vascularity is not engorged. A trace of pleural fluid may be present on the right. There is no alveolar infiltrate. There is density in the retrocardiac region which likely reflects a hiatal hernia given the appearance on the previous studies. The observed bony thorax is unremarkable.  IMPRESSION: There is mild hypoinflation greater on the right than on the left. There is no acute cardiopulmonary abnormality.   Electronically Signed   By: David  Martinique M.D.   On: 09/21/2014 07:22   BMET    Component Value Date/Time   NA 134* 09/21/2014 0246   K 4.2 09/21/2014 0246   CL 107 09/21/2014 0246   CO2 13* 09/21/2014 0246   GLUCOSE 83 09/21/2014 0246   BUN 42* 09/21/2014 0246   CREATININE 3.86* 09/21/2014 0246   CREATININE 0.83 09/10/2013 1026   CALCIUM 7.3* 09/21/2014 0246   GFRNONAA 10* 09/21/2014 0246   GFRNONAA 54* 05/18/2012 0839   GFRAA 12* 09/21/2014 0246   GFRAA 63 05/18/2012 0839   CBC    Component Value Date/Time   WBC 14.0* 09/21/2014 0246   RBC 3.82* 09/21/2014 0246   RBC 4.03 07/06/2009 2249   HGB 11.3* 09/21/2014 0246   HGB 14.2 08/28/2010 1041   HCT 33.0* 09/21/2014 0246   PLT 92* 09/21/2014 0246   MCV 86.4 09/21/2014 0246   MCH 29.6 09/21/2014 0246   MCHC 34.2 09/21/2014 0246   RDW 14.4 09/21/2014 0246   LYMPHSABS 0.3* 09/21/2014 0246   MONOABS 0.5 09/21/2014 0246   EOSABS 0.0 09/21/2014 0246   BASOSABS 0.0 09/21/2014 0246     Assessment:  1. ARF sec ischemic ATN in face of ACE, UO minimal, Scr rising 2. Mass between Rt adrenal gland and IVC 3. Chronic A fib 4. Met acidosis  Plan: 1. Change fluids to isotonic bicarb at 50cc 2. Start IV lasix 3. Wean off levophed 4. Daily labs 5. To get renal US to follow up on dilated ureter   Zalma Channing T

## 2014-09-21 NOTE — Progress Notes (Signed)
[facility] for Infectious Disease    Subjective: No new complaints   Antibiotics:  Anti-infectives    Start     Dose/Rate Route Frequency Ordered Stop   09/22/14 0600  meropenem (MERREM) 500 mg in sodium chloride 0.9 % 50 mL IVPB  Status:  Discontinued     500 mg 100 mL/hr over 30 Minutes Intravenous Every 24 hours 09/21/14 0656 09/21/14 1208   09/22/14 0600  meropenem (MERREM) 500 mg in sodium chloride 0.9 % 50 mL IVPB     500 mg 100 mL/hr over 30 Minutes Intravenous Every 24 hours 09/21/14 1353     09/20/14 1500  vancomycin (VANCOCIN) 500 mg in sodium chloride 0.9 % 100 mL IVPB  Status:  Discontinued     500 mg 100 mL/hr over 60 Minutes Intravenous Every 24 hours 09/19/14 1545 09/20/14 1112   09/19/14 1800  meropenem (MERREM) 500 mg in sodium chloride 0.9 % 50 mL IVPB  Status:  Discontinued     500 mg 100 mL/hr over 30 Minutes Intravenous Every 12 hours 09/19/14 0859 09/21/14 0656   09/18/14 1800  meropenem (MERREM) 1 g in sodium chloride 0.9 % 100 mL IVPB  Status:  Discontinued     1 g 200 mL/hr over 30 Minutes Intravenous Every 12 hours 09/18/14 1707 09/19/14 0859   09/16/14 1200  vancomycin (VANCOCIN) IVPB 750 mg/150 ml premix  Status:  Discontinued     750 mg 150 mL/hr over 60 Minutes Intravenous Every 24 hours 09/16/14 1135 09/19/14 1545   09/16/14 1200  piperacillin-tazobactam (ZOSYN) IVPB 3.375 g  Status:  Discontinued     3.375 g 12.5 mL/hr over 240 Minutes Intravenous Every 8 hours 09/16/14 1135 09/18/14 1641   09/15/14 1530  cefTRIAXone (ROCEPHIN) 1 g in dextrose 5 % 50 mL IVPB  Status:  Discontinued     1 g 100 mL/hr over 30 Minutes Intravenous Every 24 hours 09/15/14 1521 09/16/14 1120      Medications: Scheduled Meds: . amiodarone  400 mg Oral BID  . atorvastatin  10 mg Oral q1800  . furosemide  160 mg Intravenous Q6H  . [START ON 09/22/2014] meropenem (MERREM) IV  500 mg Intravenous Q24H  . pantoprazole  40 mg Oral Daily   Continuous  Infusions: . sodium chloride Stopped (09/17/14 1723)  . sodium chloride Stopped (09/20/14 1930)  . phenylephrine (NEO-SYNEPHRINE) Adult infusion 20 mcg/min (09/21/14 1932)  .  sodium bicarbonate 150 mEq in sterile water 1000 mL infusion 50 mL/hr at 09/21/14 0949   PRN Meds:.acetaminophen **OR** acetaminophen, metoprolol, morphine injection, naLOXone (NARCAN)  injection    Objective: Weight change:   Intake/Output Summary (Last 24 hours) at 09/21/14 1947 Last data filed at 09/21/14 1900  Gross per 24 hour  Intake 2563.75 ml  Output    300 ml  Net 2263.75 ml   Blood pressure 106/57, pulse 105, temperature 97.6 F (36.4 C), temperature source Axillary, resp. rate 17, height 5' 1"  (1.549 m), weight 150 lb 2.1 oz (68.1 kg), SpO2 97 %. Temp:  [97.3 F (36.3 C)-99.5 F (37.5 C)] 97.6 F (36.4 C) (08/10 1930) Pulse Rate:  [45-152] 105 (08/10 1900) Resp:  [0-28] 17 (08/10 1900) BP: (85-152)/(43-103) 106/57 mmHg (08/10 1900) SpO2:  [89 %-100 %] 97 % (08/10 1900)  Physical Exam: General: Alert and awake, oriented HEENT: anicteric sclera, , EOMI CVS regular rate, normal r,  no murmur rubs or gallops Chest: clear to auscultation bilaterally, no  wheezing, rales or rhonchi Abdomen: soft nontender, nondistended, normal bowel sounds, Extremities: no  clubbing or edema noted bilaterally Skin: no rashes Neuro: nonfocal  CBC: CBC Latest Ref Rng 09/21/2014 09/20/2014 09/19/2014  WBC 4.0 - 10.5 K/uL 14.0(H) 20.9(H) 25.7(H)  Hemoglobin 12.0 - 15.0 g/dL 11.3(L) 11.6(L) 11.8(L)  Hematocrit 36.0 - 46.0 % 33.0(L) 32.2(L) 34.1(L)  Platelets 150 - 400 K/uL 92(L) 113(L) 95(L)      BMET  Recent Labs  09/20/14 0351 09/21/14 0246  NA 132* 134*  K 4.5 4.2  CL 104 107  CO2 14* 13*  GLUCOSE 98 83  BUN 40* 42*  CREATININE 3.37* 3.86*  CALCIUM 7.4* 7.3*     Liver Panel   Recent Labs  09/19/14 0554  PROT 5.7*  ALBUMIN 1.8*  AST 29  ALT 19  ALKPHOS 174*  BILITOT 0.7        Sedimentation Rate No results for input(s): ESRSEDRATE in the last 72 hours. C-Reactive Protein No results for input(s): CRP in the last 72 hours.  Micro Results: Recent Results (from the past 720 hour(s))  Culture, blood (routine x 2)     Status: None   Collection Time: 09/15/14  3:55 PM  Result Value Ref Range Status   Specimen Description BLOOD LEFT HAND  Final   Special Requests BOTTLES DRAWN AEROBIC AND ANAEROBIC 5CC  Final   Culture NO GROWTH 5 DAYS  Final   Report Status 09/20/2014 FINAL  Final  Culture, blood (routine x 2)     Status: None   Collection Time: 09/15/14  4:03 PM  Result Value Ref Range Status   Specimen Description BLOOD RIGHT ANTECUBITAL  Final   Special Requests BOTTLES DRAWN AEROBIC AND ANAEROBIC 5CC  Final   Culture NO GROWTH 5 DAYS  Final   Report Status 09/20/2014 FINAL  Final  Culture, Urine     Status: None   Collection Time: 09/15/14  7:43 PM  Result Value Ref Range Status   Specimen Description URINE, RANDOM  Final   Special Requests NONE  Final   Culture MULTIPLE SPECIES PRESENT, SUGGEST RECOLLECTION  Final   Report Status 09/17/2014 FINAL  Final  MRSA PCR Screening     Status: None   Collection Time: 09/16/14 12:35 PM  Result Value Ref Range Status   MRSA by PCR NEGATIVE NEGATIVE Final    Comment:        The GeneXpert MRSA Assay (FDA approved for NASAL specimens only), is one component of a comprehensive MRSA colonization surveillance program. It is not intended to diagnose MRSA infection nor to guide or monitor treatment for MRSA infections.   Culture, blood (routine x 2)     Status: None   Collection Time: 09/16/14  2:32 PM  Result Value Ref Range Status   Specimen Description BLOOD BLOOD RIGHT HAND  Final   Special Requests BOTTLES DRAWN AEROBIC AND ANAEROBIC 10CC  Final   Culture NO GROWTH 5 DAYS  Final   Report Status 09/21/2014 FINAL  Final  Culture, blood (routine x 2)     Status: None   Collection Time: 09/16/14   2:45 PM  Result Value Ref Range Status   Specimen Description BLOOD BLOOD LEFT HAND  Final   Special Requests BOTTLES DRAWN AEROBIC AND ANAEROBIC 10CC  Final   Culture NO GROWTH 5 DAYS  Final   Report Status 09/21/2014 FINAL  Final  Culture, Urine     Status: None   Collection Time: 09/17/14  7:13 PM  Result Value Ref Range Status   Specimen Description URINE, CATHETERIZED  Final   Special Requests NONE  Final   Culture NO GROWTH 2 DAYS  Final   Report Status 09/19/2014 FINAL  Final  Culture, blood (routine x 2)     Status: None (Preliminary result)   Collection Time: 09/18/14  6:39 PM  Result Value Ref Range Status   Specimen Description BLOOD LEFT ANTECUBITAL  Final   Special Requests BOTTLES DRAWN AEROBIC ONLY 10CC  Final   Culture NO GROWTH 3 DAYS  Final   Report Status PENDING  Incomplete  Culture, blood (routine x 2)     Status: None (Preliminary result)   Collection Time: 09/18/14  6:46 PM  Result Value Ref Range Status   Specimen Description BLOOD LEFT HAND  Final   Special Requests IN PEDIATRIC BOTTLE 3CC  Final   Culture NO GROWTH 3 DAYS  Final   Report Status PENDING  Incomplete    Studies/Results: Mr Liver Wo Contrast  09/21/2014   CLINICAL DATA:  Liver mass on CT  EXAM: MRI ABDOMEN WITHOUT CONTRAST  TECHNIQUE: Multiplanar multisequence MR imaging was performed without the administration of intravenous contrast.  COMPARISON:  CT abdomen pelvis dated 09/18/2014  FINDINGS: Severely motion degraded images.  Lower chest: Small bilateral pleural effusions. Associated bilateral lower lobe opacities, likely atelectasis.  Hepatobiliary: Liver is grossly unremarkable. Specifically, no mass is seen. The CT abnormality is distinct from the right liver.  Layering gallstones versus gallbladder sludge (series 5/ image 23). No associated inflammatory changes.  No intrahepatic ductal dilatation. Common duct is mildly dilated with a suspected 5 mm distal CBD stone (series 5/ image 29).   Pancreas: Pancreatic atrophy. Pancreas is poorly evaluated due to motion degradation.  Spleen: Within normal limits.  Adrenals/Urinary Tract: Thickening of the left adrenal gland without discrete mass (series 5/image 26).  Right adrenal gland is incompletely visualized but mildly thickened (series 5/image 23).  2.0 x 2.5 cm well-circumscribed T2 hyperintense mass abutting the anterior aspect of the right adrenal gland and displacing the IVC anteriorly/laterally (series 5/image 20). It is unclear whether the mass is of primary adrenal origin or possibly originates along the posterior wall of the infrahepatic IVC. The lesion is new from 2011 but is poorly characterized on this motion degraded unenhanced MRI. Primary differential considerations include pheochromocytoma or sarcoma. If this is adrenal in etiology, metastasis would also be in the differential in the setting of a known primary neoplasm.  3.0 cm left lower pole renal cyst (series 11/ image 33). Additional subcentimeter right lower pole renal cyst (series 11/ image 34). Bilateral renal scarring. No hydronephrosis.  Stomach/Bowel: Stomach is notable for a moderate to large hiatal hernia.  Visualized bowel is poorly evaluated/ grossly unremarkable.  Vascular/Lymphatic: No evidence of abdominal aortic aneurysm.  Infrahepatic IVC is effaced/displaced anteriorly, as described above.  No gross abdominal lymphadenopathy.  Other: No abdominal ascites.  Retroperitoneal standing.  Moderate body wall edema.  Musculoskeletal: Degenerative changes of the visualized thoracolumbar spine.  IMPRESSION: Severely motion degraded images. Evaluation is also limited due to lack of intravenous contrast administration.  2.5 cm well-circumscribed T2 hyperintense mass located between the intrahepatic IVC and right adrenal gland, as described above, exact organ of origin unclear. Primary differential considerations include pheochromocytoma, sarcoma, or possibly metastasis.  Liver is  grossly unremarkable.  Additional ancillary findings as above.   Electronically Signed   By: Julian Hy M.D.   On: 09/21/2014 08:20   Dg Chest [facility]  1 View  09/21/2014   CLINICAL DATA:  Acute renal insufficiency, pulmonary embolism and infarction, atrial fibrillation, CHF.  EXAM: PORTABLE CHEST - 1 VIEW  COMPARISON:  Portable chest x-ray of September 17, 2014 and [place] and lateral chest x-ray of August 05, 2013  FINDINGS: The lungs are mildly hypoinflated greater on the right than on the left. The cardiac silhouette is enlarged. The pulmonary vascularity is not engorged. A trace of pleural fluid may be present on the right. There is no alveolar infiltrate. There is density in the retrocardiac region which likely reflects a hiatal hernia given the appearance on the previous studies. The observed bony thorax is unremarkable.  IMPRESSION: There is mild hypoinflation greater on the right than on the left. There is no acute cardiopulmonary abnormality.   Electronically Signed   By: David  Martinique M.D.   On: 09/21/2014 07:22      Assessment/Plan:  Active Problems:   HYPERCHOLESTEROLEMIA   HYPERTENSION, BENIGN SYSTEMIC   Pulmonary embolism and infarction   GERD   A-fib   Hyponatremia   AKI (acute kidney injury)   SOB (shortness of breath)   Elevated troponin   Chronic diastolic congestive heart failure   Elevated WBC count   Urinary tract infectious disease   Blood poisoning   Septic shock   Liver mass, left lobe   FUO (fever of unknown origin)    Sara Russell is a 81 y.o. female  with chronic afib, admitted for DOE, hypotension, fevers, rigors, leukocytosis with mass adjacent to liver now on MRI appears c/w maligancy   #1 Rigors, septic physiology: the liver mass is NOT an abscess by imaging and her blood and urine cultures on admission have been sterile.  It is POSSIBLE that she has had transient bacteria released into bloodstream from malignancy adjacent to her IVC but I cannot treat,  prevent or  cure such phenomena with antibiotics  I will therefore STOP IV antibiotics    I think her tumor in liver is causing her fever and possibly her autonomic instability  Will observe off abx     LOS: 6 days   Alcide Evener 09/21/2014, 7:47 PM

## 2014-09-21 NOTE — Progress Notes (Signed)
PULMONARY / CRITICAL CARE MEDICINE   Name: Sara Russell MRN: [id number] DOB: 05-12-1930    ADMISSION DATE:  09/15/2014 CONSULTATION DATE:  09/16/14  REFERRING MD :  Family Medicine Service  CHIEF COMPLAINT:  Hypotension  INITIAL PRESENTATION:  Admitted 8/4 with 1 day history of shortness of breath on exertion. Known history of atrial fibrillation on systemic and coagulation with Eliquis. Also has a known history of PE/DVT in 2009. Denies any infectious symptoms or complaints other than dyspnea.  STUDIES:  TTE (8/4) -  EF 45-50%, septal hypokinesis, severely dilated LA, mod/severe TR, PA peak pressure =67mHg Portable CXR (8/5) - No effusion or opacity. Hiatal hernia noted.  SIGNIFICANT EVENTS: 8/4 - admit 8/5 - transfer to icu w/ respiratory failure & a fib w/ rvr 8/6 hypothermic with rigors 8/7 CT ab/pel > heterogenous masslike abnormality along posterior liver, large, unclear if mass or abscess, smsll bilat effusions, distended gallbladder, chronic adnexal cyst  SUBJECTIVE: Remains on neo drip, more interactive in a chair this AM.  VITAL SIGNS: Temp:  [97.3 F (36.3 C)-99.5 F (37.5 C)] 97.3 F (36.3 C) (08/10 0700) Pulse Rate:  [29-152] 130 (08/10 1015) Resp:  [0-28] 18 (08/10 1015) BP: (85-152)/(46-103) 111/65 mmHg (08/10 1015) SpO2:  [89 %-100 %] 97 % (08/10 1015) HEMODYNAMICS:   VENTILATOR SETTINGS:   INTAKE / OUTPUT:  Intake/Output Summary (Last 24 hours) at 09/21/14 1024 Last data filed at 09/21/14 1000  Gross per 24 hour  Intake 2570.17 ml  Output    287 ml  Net 2283.17 ml   PHYSICAL EXAMINATION: General:  Awake. Alert. No acute distress.  HENT: NCAT, EOMi PULM: diminished bases, good air movement, clear CV: Irreg irreg, no mgr GI: BS+, soft, nontender all over MSK: normal bulk, tone Neuro: A&Ox4, maew  LABS:  CBC  Recent Labs Lab 09/19/14 0241 09/20/14 0351 09/21/14 0246  WBC 25.7* 20.9* 14.0*  HGB 11.8* 11.6* 11.3*  HCT 34.1* 32.2* 33.0*   PLT 95* 113* 92*   Coag's No results for input(s): APTT, INR in the last 168 hours. BMET  Recent Labs Lab 09/19/14 0554 09/20/14 0351 09/21/14 0246  NA 131* 132* 134*  K 5.6* 4.5 4.2  CL 103 104 107  CO2 15* 14* 13*  BUN 36* 40* 42*  CREATININE 3.00* 3.37* 3.86*  GLUCOSE 143* 98 83   Electrolytes  Recent Labs Lab 09/16/14 1446 09/17/14 0252  09/19/14 0554 09/20/14 0351 09/21/14 0246  CALCIUM  --  7.4*  < > 7.5* 7.4* 7.3*  MG 1.3* 2.5*  --   --  2.0  --   PHOS  --  1.5*  --   --  3.9  --   < > = values in this interval not displayed. Sepsis Markers  Recent Labs Lab 09/16/14 1432 09/16/14 1446 09/16/14 2016 09/17/14 0252 09/17/14 1618 09/18/14 0325  LATICACIDVEN 1.9  --  4.2*  --  3.6*  --   PROCALCITON  --  39.21  --  63.54  --  84.81   ABG  Recent Labs Lab 09/16/14 1100 09/17/14 1842  PHART 7.471* 7.381  PCO2ART 17.6* 22.5*  PO2ART 404* 202.0*   Liver Enzymes  Recent Labs Lab 09/15/14 1100 09/17/14 0252 09/19/14 0554  AST 40  --  29  ALT 21  --  19  ALKPHOS 173*  --  174*  BILITOT 0.9  --  0.7  ALBUMIN 2.5* 1.8* 1.8*   Cardiac Enzymes  Recent Labs Lab 09/16/14 1104 09/16/14 1450 09/17/14  0035  TROPONINI 0.24* 0.22* 0.17*   Glucose No results for input(s): GLUCAP in the last 168 hours.  Imaging Mr Liver Wo Contrast  09/21/2014   CLINICAL DATA:  Liver mass on CT  EXAM: MRI ABDOMEN WITHOUT CONTRAST  TECHNIQUE: Multiplanar multisequence MR imaging was performed without the administration of intravenous contrast.  COMPARISON:  CT abdomen pelvis dated 09/18/2014  FINDINGS: Severely motion degraded images.  Lower chest: Small bilateral pleural effusions. Associated bilateral lower lobe opacities, likely atelectasis.  Hepatobiliary: Liver is grossly unremarkable. Specifically, no mass is seen. The CT abnormality is distinct from the right liver.  Layering gallstones versus gallbladder sludge (series 5/ image 23). No associated inflammatory  changes.  No intrahepatic ductal dilatation. Common duct is mildly dilated with a suspected 5 mm distal CBD stone (series 5/ image 29).  Pancreas: Pancreatic atrophy. Pancreas is poorly evaluated due to motion degradation.  Spleen: Within normal limits.  Adrenals/Urinary Tract: Thickening of the left adrenal gland without discrete mass (series 5/image 26).  Right adrenal gland is incompletely visualized but mildly thickened (series 5/image 23).  2.0 x 2.5 cm well-circumscribed T2 hyperintense mass abutting the anterior aspect of the right adrenal gland and displacing the IVC anteriorly/laterally (series 5/image 20). It is unclear whether the mass is of primary adrenal origin or possibly originates along the posterior wall of the infrahepatic IVC. The lesion is new from 2011 but is poorly characterized on this motion degraded unenhanced MRI. Primary differential considerations include pheochromocytoma or sarcoma. If this is adrenal in etiology, metastasis would also be in the differential in the setting of a known primary neoplasm.  3.0 cm left lower pole renal cyst (series 11/ image 33). Additional subcentimeter right lower pole renal cyst (series 11/ image 34). Bilateral renal scarring. No hydronephrosis.  Stomach/Bowel: Stomach is notable for a moderate to large hiatal hernia.  Visualized bowel is poorly evaluated/ grossly unremarkable.  Vascular/Lymphatic: No evidence of abdominal aortic aneurysm.  Infrahepatic IVC is effaced/displaced anteriorly, as described above.  No gross abdominal lymphadenopathy.  Other: No abdominal ascites.  Retroperitoneal standing.  Moderate body wall edema.  Musculoskeletal: Degenerative changes of the visualized thoracolumbar spine.  IMPRESSION: Severely motion degraded images. Evaluation is also limited due to lack of intravenous contrast administration.  2.5 cm well-circumscribed T2 hyperintense mass located between the intrahepatic IVC and right adrenal gland, as described above,  exact organ of origin unclear. Primary differential considerations include pheochromocytoma, sarcoma, or possibly metastasis.  Liver is grossly unremarkable.  Additional ancillary findings as above.   Electronically Signed   By: Julian Hy M.D.   On: 09/21/2014 08:20   Dg Chest Port 1 View  09/21/2014   CLINICAL DATA:  Acute renal insufficiency, pulmonary embolism and infarction, atrial fibrillation, CHF.  EXAM: PORTABLE CHEST - 1 VIEW  COMPARISON:  Portable chest x-ray of September 17, 2014 and PA and lateral chest x-ray of August 05, 2013  FINDINGS: The lungs are mildly hypoinflated greater on the right than on the left. The cardiac silhouette is enlarged. The pulmonary vascularity is not engorged. A trace of pleural fluid may be present on the right. There is no alveolar infiltrate. There is density in the retrocardiac region which likely reflects a hiatal hernia given the appearance on the previous studies. The observed bony thorax is unremarkable.  IMPRESSION: There is mild hypoinflation greater on the right than on the left. There is no acute cardiopulmonary abnormality.   Electronically Signed   By: David  Martinique M.D.  On: 09/21/2014 07:22     ASSESSMENT / PLAN:  PULMONARY A:  Acute hypoxic respiratory failure > resolved At risk for volume overload P:   Monitor respiratory status with IVF resuscitation Out of bed  IS per RT protocol Titrate O2 for sat of 88-92%.  CARDIOVASCULAR A:  A. fib with RVR > improved rate control Elevated troponin - demand ischemia  Shock - presumed septic as EF normal H/O HTN  H/O HLD P:  B blocker PRN for rate control. Neo as needed for MAP > 65. Bicarb drip Continue Lipitor Patient wishes for DNR status.  RENAL A:   Acute oliguric renal failure - now worsening, oliguric; presumed from sepsis Hyponatremia P:   Continue IVF for now given sepsis picture U/A now, urine Cr/Na Renal ultrasound per renal Patient is refusing dialysis Bladder  scan Renal consult  GASTROINTESTINAL A:   Hx ulcerative colitis  Ct abd/pelvis with likely infectious mass communicating with the SVC, see ID section. P:   Protonix PO daily Hemoccult stools  Renal diet.  HEMATOLOGIC A:   Leukocytosis - resolved Mild Anemia - no signs of active bleeding H/O PE/DVT - 2009  P:  Monitor hemoglobin daily with CBC Holding Eliquis  INFECTIOUS A:   Possible UTI - with rigors concern for abd abscess/ pyelo Ct abd/pelvis with likely infectious mass communicating with the SVC > unclear if neoplasm or infectious but favor infectious  P:   BCx2 8/4>>NTD UC 8/4>>NTD  Abx:  Vancomycin 8/5>>>8/9 Zosyn 8/5>>>8/7 Aztreonam 8/7>>>8/10  MRI noted, solid mass.  ENDOCRINE A:   No acute issues  P:   Monitor BG on daily labs  NEUROLOGIC A:   No acute issues P:   Monitor mental status   FAMILY  - Updates: Patient and sons updated bedside, does not wish for resuscitation efforts and no dialysis.  Will call   - Inter-disciplinary family meet or Palliative Care meeting due by:  DNR issued  The patient is critically ill with multiple organ systems failure and requires high complexity decision making for assessment and support, frequent evaluation and titration of therapies, application of advanced monitoring technologies and extensive interpretation of multiple databases.   Critical Care Time devoted to patient care services described in this note is  35  Minutes. This time reflects time of care of this signee Dr Jennet Maduro. This critical care time does not reflect procedure time, or teaching time or supervisory time of PA/NP/Med student/Med Resident etc but could involve care discussion time.  Rush Farmer, M.D. [facility] Pulmonary/Critical Care Medicine. Pager: [phone]. After hours pager: [phone].  09/21/2014  10:24 AM

## 2014-09-21 NOTE — Progress Notes (Signed)
Initial Nutrition Assessment  INTERVENTION:   Nepro Shake po TID, each supplement provides 425 kcal and 19 grams protein   NUTRITION DIAGNOSIS:   Increased nutrient needs related to catabolic illness as evidenced by estimated needs.   GOAL:   Patient will meet greater than or equal to 90% of their needs   MONITOR:   PO intake, Supplement acceptance, Labs, I & O's  REASON FOR ASSESSMENT:   Consult Poor PO  ASSESSMENT:   81 Y/O F with PMX of HTN,HLD, PE, DVT, GERD,DJD,Afib, dCHF,presented with hx of SOB Pt admitted with with acute renal failure and sepsis and is refusing HD for now, renal following on lasix. Pt with new liver mass suspected to be malignancy.   Pt currently in ultrasound. Spoke with 2 sons at bedside. PTA pt was eating well and drinking ensure as needed. She has had no recent weight changes. However her appetite has been very poor. Nepro ordered TID. Per sons pt likes and is willing to drink. We discussed how many supplements pt would need to consume to meet needs until eating better.   Labs reviewed: sodium low, BUN/Cr and phosphorus are elevated  Unable to complete Nutrition-Focused physical exam at this time.    Diet Order:  Diet renal with fluid restriction Fluid restriction:: 1200 mL Fluid; Room service appropriate?: Yes; Fluid consistency:: Thin  Skin:  Reviewed, no issues  Last BM:  8/11  Height:   Ht Readings from Last 1 Encounters:  09/16/14 5' 1"  (1.549 m)    Weight:   Wt Readings from Last 1 Encounters:  09/16/14 150 lb 2.1 oz (68.1 kg)    Ideal Body Weight:  47.7 kg  BMI:  Body mass index is 28.38 kg/(m^2).  Estimated Nutritional Needs:   Kcal:  1400-1600  Protein:  80-100 grams  Fluid:  >/= 1.2 L/day  EDUCATION NEEDS:   No education needs identified at this time  [place], [place], [place] Pager [phone] After Hours Pager

## 2014-09-22 ENCOUNTER — Inpatient Hospital Stay (HOSPITAL_COMMUNITY): Payer: Medicare Other

## 2014-09-22 DIAGNOSIS — N170 Acute kidney failure with tubular necrosis: Secondary | ICD-10-CM

## 2014-09-22 DIAGNOSIS — Z515 Encounter for palliative care: Secondary | ICD-10-CM

## 2014-09-22 DIAGNOSIS — N179 Acute kidney failure, unspecified: Secondary | ICD-10-CM | POA: Insufficient documentation

## 2014-09-22 DIAGNOSIS — A419 Sepsis, unspecified organism: Secondary | ICD-10-CM | POA: Insufficient documentation

## 2014-09-22 DIAGNOSIS — R5381 Other malaise: Secondary | ICD-10-CM

## 2014-09-22 LAB — RENAL FUNCTION PANEL
Albumin: 1.6 g/dL — ABNORMAL LOW (ref 3.5–5.0)
Anion gap: 15 (ref 5–15)
BUN: 47 mg/dL — ABNORMAL HIGH (ref 6–20)
CO2: 16 mmol/L — ABNORMAL LOW (ref 22–32)
Calcium: 7.6 mg/dL — ABNORMAL LOW (ref 8.9–10.3)
Chloride: 101 mmol/L (ref 101–111)
Creatinine, Ser: 4.17 mg/dL — ABNORMAL HIGH (ref 0.44–1.00)
GFR calc Af Amer: 11 mL/min — ABNORMAL LOW (ref >60)
GFR calc non Af Amer: 9 mL/min — ABNORMAL LOW (ref >60)
Glucose, Bld: 76 mg/dL (ref 65–99)
Phosphorus: 5.2 mg/dL — ABNORMAL HIGH (ref 2.5–4.6)
Potassium: 3.8 mmol/L (ref 3.5–5.1)
Sodium: 132 mmol/L — ABNORMAL LOW (ref 135–145)

## 2014-09-22 LAB — BASIC METABOLIC PANEL
Anion gap: 16 — ABNORMAL HIGH (ref 5–15)
BUN: 46 mg/dL — ABNORMAL HIGH (ref 6–20)
CO2: 15 mmol/L — ABNORMAL LOW (ref 22–32)
Calcium: 7.6 mg/dL — ABNORMAL LOW (ref 8.9–10.3)
Chloride: 101 mmol/L (ref 101–111)
Creatinine, Ser: 4.09 mg/dL — ABNORMAL HIGH (ref 0.44–1.00)
GFR calc Af Amer: 11 mL/min — ABNORMAL LOW (ref >60)
GFR calc non Af Amer: 9 mL/min — ABNORMAL LOW (ref >60)
Glucose, Bld: 73 mg/dL (ref 65–99)
Potassium: 4.1 mmol/L (ref 3.5–5.1)
Sodium: 132 mmol/L — ABNORMAL LOW (ref 135–145)

## 2014-09-22 LAB — PHOSPHORUS: Phosphorus: 5.2 mg/dL — ABNORMAL HIGH (ref 2.5–4.6)

## 2014-09-22 LAB — CBC
HCT: 35.1 % — ABNORMAL LOW (ref 36.0–46.0)
Hemoglobin: 12.1 g/dL (ref 12.0–15.0)
MCH: 29.2 pg (ref 26.0–34.0)
MCHC: 34.5 g/dL (ref 30.0–36.0)
MCV: 84.6 fL (ref 78.0–100.0)
Platelets: 63 10*3/uL — ABNORMAL LOW (ref 150–400)
RBC: 4.15 MIL/uL (ref 3.87–5.11)
RDW: 14.4 % (ref 11.5–15.5)
WBC: 10 10*3/uL (ref 4.0–10.5)

## 2014-09-22 LAB — MAGNESIUM: Magnesium: 1.8 mg/dL (ref 1.7–2.4)

## 2014-09-22 MED ORDER — FUROSEMIDE 10 MG/ML IJ SOLN
160.0000 mg | Freq: Three times a day (TID) | INTRAVENOUS | Status: DC
  Administered 2014-09-22 – 2014-09-23 (×3): 160 mg via INTRAVENOUS
  Filled 2014-09-22 (×5): qty 16

## 2014-09-22 MED ORDER — FUROSEMIDE 10 MG/ML IJ SOLN
40.0000 mg | Freq: Four times a day (QID) | INTRAMUSCULAR | Status: DC
  Administered 2014-09-22: 40 mg via INTRAVENOUS
  Filled 2014-09-22: qty 4

## 2014-09-22 MED ORDER — NEPRO/CARBSTEADY PO LIQD
237.0000 mL | Freq: Three times a day (TID) | ORAL | Status: DC
  Administered 2014-09-22 – 2014-09-27 (×12): 237 mL via ORAL
  Filled 2014-09-22 (×20): qty 237

## 2014-09-22 MED ORDER — CARVEDILOL 3.125 MG PO TABS
3.1250 mg | ORAL_TABLET | Freq: Two times a day (BID) | ORAL | Status: DC
  Administered 2014-09-22 – 2014-09-25 (×7): 3.125 mg via ORAL
  Filled 2014-09-22 (×10): qty 1

## 2014-09-22 MED ORDER — AMIODARONE HCL 200 MG PO TABS
200.0000 mg | ORAL_TABLET | Freq: Every day | ORAL | Status: DC
  Administered 2014-09-22 – 2014-09-27 (×6): 200 mg via ORAL
  Filled 2014-09-22 (×6): qty 1

## 2014-09-22 NOTE — Progress Notes (Signed)
Seen and examined.  Nicely improving except for renal function.  Off antibiotics per ID.  Off pressors per CCM.  Likely to floor today or tomorrow where FPTS will assume primary management.  Appreciate palliative care consult.  No change in plan to treat the acute illness and have a comfort approach to underlying disease.

## 2014-09-22 NOTE — Progress Notes (Signed)
[facility] for Infectious Disease    Subjective: No new complaints, patient in good spirits under the circumstanes   Antibiotics:  Anti-infectives    Start     Dose/Rate Route Frequency Ordered Stop   09/22/14 0600  meropenem (MERREM) 500 mg in sodium chloride 0.9 % 50 mL IVPB  Status:  Discontinued     500 mg 100 mL/hr over 30 Minutes Intravenous Every 24 hours 09/21/14 0656 09/21/14 1208   09/22/14 0600  meropenem (MERREM) 500 mg in sodium chloride 0.9 % 50 mL IVPB  Status:  Discontinued     500 mg 100 mL/hr over 30 Minutes Intravenous Every 24 hours 09/21/14 1353 09/22/14 0646   09/20/14 1500  vancomycin (VANCOCIN) 500 mg in sodium chloride 0.9 % 100 mL IVPB  Status:  Discontinued     500 mg 100 mL/hr over 60 Minutes Intravenous Every 24 hours 09/19/14 1545 09/20/14 1112   09/19/14 1800  meropenem (MERREM) 500 mg in sodium chloride 0.9 % 50 mL IVPB  Status:  Discontinued     500 mg 100 mL/hr over 30 Minutes Intravenous Every 12 hours 09/19/14 0859 09/21/14 0656   09/18/14 1800  meropenem (MERREM) 1 g in sodium chloride 0.9 % 100 mL IVPB  Status:  Discontinued     1 g 200 mL/hr over 30 Minutes Intravenous Every 12 hours 09/18/14 1707 09/19/14 0859   09/16/14 1200  vancomycin (VANCOCIN) IVPB 750 mg/150 ml premix  Status:  Discontinued     750 mg 150 mL/hr over 60 Minutes Intravenous Every 24 hours 09/16/14 1135 09/19/14 1545   09/16/14 1200  piperacillin-tazobactam (ZOSYN) IVPB 3.375 g  Status:  Discontinued     3.375 g 12.5 mL/hr over 240 Minutes Intravenous Every 8 hours 09/16/14 1135 09/18/14 1641   09/15/14 1530  cefTRIAXone (ROCEPHIN) 1 g in dextrose 5 % 50 mL IVPB  Status:  Discontinued     1 g 100 mL/hr over 30 Minutes Intravenous Every 24 hours 09/15/14 1521 09/16/14 1120      Medications: Scheduled Meds: . amiodarone  200 mg Oral Daily  . atorvastatin  10 mg Oral q1800  . carvedilol  3.125 mg Oral BID WC  . feeding supplement (NEPRO CARB  STEADY)  237 mL Oral TID BM  . furosemide  160 mg Intravenous 3 times per day  . pantoprazole  40 mg Oral Daily   Continuous Infusions: . sodium chloride Stopped (09/17/14 1723)  . sodium chloride Stopped (09/20/14 1930)  . phenylephrine (NEO-SYNEPHRINE) Adult infusion Stopped (09/22/14 0415)  .  sodium bicarbonate 150 mEq in sterile water 1000 mL infusion 50 mL/hr at 09/21/14 0949   PRN Meds:.acetaminophen **OR** acetaminophen, metoprolol, morphine injection, naLOXone (NARCAN)  injection    Objective: Weight change:   Intake/Output Summary (Last 24 hours) at 09/22/14 1934 Last data filed at 09/22/14 1600  Gross per 24 hour  Intake 1808.33 ml  Output   1327 ml  Net 481.33 ml   Blood pressure 102/68, pulse 130, temperature 96.6 F (35.9 C), temperature source Axillary, resp. rate 26, height 5' 1"  (1.549 m), weight 150 lb 2.1 oz (68.1 kg), SpO2 94 %. Temp:  [96.6 F (35.9 C)-97.6 F (36.4 C)] 96.6 F (35.9 C) (08/11 1200) Pulse Rate:  [51-204] 130 (08/11 1737) Resp:  [0-28] 26 (08/11 1630) BP: (93-148)/(40-113) 102/68 mmHg (08/11 1737) SpO2:  [90 %-100 %] 94 % (08/11 1630)  Physical Exam: General: Alert and awake, oriented pleasant  Neuro: nonfocal  CBC: CBC Latest Ref Rng 09/22/2014 09/21/2014 09/20/2014  WBC 4.0 - 10.5 K/uL 10.0 14.0(H) 20.9(H)  Hemoglobin 12.0 - 15.0 g/dL 12.1 11.3(L) 11.6(L)  Hematocrit 36.0 - 46.0 % 35.1(L) 33.0(L) 32.2(L)  Platelets 150 - 400 K/uL 63(L) 92(L) 113(L)      BMET  Recent Labs  09/21/14 0246 09/22/14 0320  NA 134* 132*  132*  K 4.2 4.1  3.8  CL 107 101  101  CO2 13* 15*  16*  GLUCOSE 83 73  76  BUN 42* 46*  47*  CREATININE 3.86* 4.09*  4.17*  CALCIUM 7.3* 7.6*  7.6*     Liver Panel   Recent Labs  09/22/14 0320  ALBUMIN 1.6*       Sedimentation Rate No results for input(s): ESRSEDRATE in the last 72 hours. C-Reactive Protein No results for input(s): CRP in the last 72 hours.  Micro Results: Recent  Results (from the past 720 hour(s))  Culture, blood (routine x 2)     Status: None   Collection Time: 09/15/14  3:55 PM  Result Value Ref Range Status   Specimen Description BLOOD LEFT HAND  Final   Special Requests BOTTLES DRAWN AEROBIC AND ANAEROBIC 5CC  Final   Culture NO GROWTH 5 DAYS  Final   Report Status 09/20/2014 FINAL  Final  Culture, blood (routine x 2)     Status: None   Collection Time: 09/15/14  4:03 PM  Result Value Ref Range Status   Specimen Description BLOOD RIGHT ANTECUBITAL  Final   Special Requests BOTTLES DRAWN AEROBIC AND ANAEROBIC 5CC  Final   Culture NO GROWTH 5 DAYS  Final   Report Status 09/20/2014 FINAL  Final  Culture, Urine     Status: None   Collection Time: 09/15/14  7:43 PM  Result Value Ref Range Status   Specimen Description URINE, RANDOM  Final   Special Requests NONE  Final   Culture MULTIPLE SPECIES PRESENT, SUGGEST RECOLLECTION  Final   Report Status 09/17/2014 FINAL  Final  MRSA PCR Screening     Status: None   Collection Time: 09/16/14 12:35 PM  Result Value Ref Range Status   MRSA by PCR NEGATIVE NEGATIVE Final    Comment:        The GeneXpert MRSA Assay (FDA approved for NASAL specimens only), is one component of a comprehensive MRSA colonization surveillance program. It is not intended to diagnose MRSA infection nor to guide or monitor treatment for MRSA infections.   Culture, blood (routine x 2)     Status: None   Collection Time: 09/16/14  2:32 PM  Result Value Ref Range Status   Specimen Description BLOOD BLOOD RIGHT HAND  Final   Special Requests BOTTLES DRAWN AEROBIC AND ANAEROBIC 10CC  Final   Culture NO GROWTH 5 DAYS  Final   Report Status 09/21/2014 FINAL  Final  Culture, blood (routine x 2)     Status: None   Collection Time: 09/16/14  2:45 PM  Result Value Ref Range Status   Specimen Description BLOOD BLOOD LEFT HAND  Final   Special Requests BOTTLES DRAWN AEROBIC AND ANAEROBIC 10CC  Final   Culture NO GROWTH 5  DAYS  Final   Report Status 09/21/2014 FINAL  Final  Culture, Urine     Status: None   Collection Time: 09/17/14  7:13 PM  Result Value Ref Range Status   Specimen Description URINE, CATHETERIZED  Final   Special Requests NONE  Final  Culture NO GROWTH 2 DAYS  Final   Report Status 09/19/2014 FINAL  Final  Culture, blood (routine x 2)     Status: None (Preliminary result)   Collection Time: 09/18/14  6:39 PM  Result Value Ref Range Status   Specimen Description BLOOD LEFT ANTECUBITAL  Final   Special Requests BOTTLES DRAWN AEROBIC ONLY 10CC  Final   Culture NO GROWTH 4 DAYS  Final   Report Status PENDING  Incomplete  Culture, blood (routine x 2)     Status: None (Preliminary result)   Collection Time: 09/18/14  6:46 PM  Result Value Ref Range Status   Specimen Description BLOOD LEFT HAND  Final   Special Requests IN PEDIATRIC BOTTLE 3CC  Final   Culture NO GROWTH 4 DAYS  Final   Report Status PENDING  Incomplete    Studies/Results: Mr Liver Wo Contrast  09/21/2014   CLINICAL DATA:  Liver mass on CT  EXAM: MRI ABDOMEN WITHOUT CONTRAST  TECHNIQUE: Multiplanar multisequence MR imaging was performed without the administration of intravenous contrast.  COMPARISON:  CT abdomen pelvis dated 09/18/2014  FINDINGS: Severely motion degraded images.  Lower chest: Small bilateral pleural effusions. Associated bilateral lower lobe opacities, likely atelectasis.  Hepatobiliary: Liver is grossly unremarkable. Specifically, no mass is seen. The CT abnormality is distinct from the right liver.  Layering gallstones versus gallbladder sludge (series 5/ image 23). No associated inflammatory changes.  No intrahepatic ductal dilatation. Common duct is mildly dilated with a suspected 5 mm distal CBD stone (series 5/ image 29).  Pancreas: Pancreatic atrophy. Pancreas is poorly evaluated due to motion degradation.  Spleen: Within normal limits.  Adrenals/Urinary Tract: Thickening of the left adrenal gland without  discrete mass (series 5/image 26).  Right adrenal gland is incompletely visualized but mildly thickened (series 5/image 23).  2.0 x 2.5 cm well-circumscribed T2 hyperintense mass abutting the anterior aspect of the right adrenal gland and displacing the IVC anteriorly/laterally (series 5/image 20). It is unclear whether the mass is of primary adrenal origin or possibly originates along the posterior wall of the infrahepatic IVC. The lesion is new from 2011 but is poorly characterized on this motion degraded unenhanced MRI. Primary differential considerations include pheochromocytoma or sarcoma. If this is adrenal in etiology, metastasis would also be in the differential in the setting of a known primary neoplasm.  3.0 cm left lower pole renal cyst (series 11/ image 33). Additional subcentimeter right lower pole renal cyst (series 11/ image 34). Bilateral renal scarring. No hydronephrosis.  Stomach/Bowel: Stomach is notable for a moderate to large hiatal hernia.  Visualized bowel is poorly evaluated/ grossly unremarkable.  Vascular/Lymphatic: No evidence of abdominal aortic aneurysm.  Infrahepatic IVC is effaced/displaced anteriorly, as described above.  No gross abdominal lymphadenopathy.  Other: No abdominal ascites.  Retroperitoneal standing.  Moderate body wall edema.  Musculoskeletal: Degenerative changes of the visualized thoracolumbar spine.  IMPRESSION: Severely motion degraded images. Evaluation is also limited due to lack of intravenous contrast administration.  2.5 cm well-circumscribed T2 hyperintense mass located between the intrahepatic IVC and right adrenal gland, as described above, exact organ of origin unclear. Primary differential considerations include pheochromocytoma, sarcoma, or possibly metastasis.  Liver is grossly unremarkable.  Additional ancillary findings as above.   Electronically Signed   By: Julian Hy M.D.   On: 09/21/2014 08:20   US Renal  09/22/2014   CLINICAL DATA:  Acute  renal failure  EXAM: RENAL / URINARY TRACT ULTRASOUND COMPLETE  COMPARISON:  09/18/2014  FINDINGS: Right Kidney:  Length: 10.8 cm. Echogenicity within normal limits. No mass or hydronephrosis visualized.  Left Kidney:  Length: 11.6 cm. A 2.9 cm cyst is noted in the upper pole similar to that seen on prior CT examination.  Bladder:  Decompressed by Foley catheter. Note is made again of a left adnexal cyst.  IMPRESSION: Left renal cyst.  No other focal renal abnormality is noted.   Electronically Signed   By: Inez Catalina M.D.   On: 09/22/2014 11:00   Dg Chest Port 1 View  09/21/2014   CLINICAL DATA:  Acute renal insufficiency, pulmonary embolism and infarction, atrial fibrillation, CHF.  EXAM: PORTABLE CHEST - 1 VIEW  COMPARISON:  Portable chest x-ray of September 17, 2014 and PA and lateral chest x-ray of August 05, 2013  FINDINGS: The lungs are mildly hypoinflated greater on the right than on the left. The cardiac silhouette is enlarged. The pulmonary vascularity is not engorged. A trace of pleural fluid may be present on the right. There is no alveolar infiltrate. There is density in the retrocardiac region which likely reflects a hiatal hernia given the appearance on the previous studies. The observed bony thorax is unremarkable.  IMPRESSION: There is mild hypoinflation greater on the right than on the left. There is no acute cardiopulmonary abnormality.   Electronically Signed   By: David  Martinique M.D.   On: 09/21/2014 07:22      Assessment/Plan:  Active Problems:   HYPERCHOLESTEROLEMIA   HYPERTENSION, BENIGN SYSTEMIC   Pulmonary embolism and infarction   GERD   A-fib   Hyponatremia   AKI (acute kidney injury)   SOB (shortness of breath)   Elevated troponin   Chronic diastolic congestive heart failure   Elevated WBC count   Urinary tract infectious disease   Blood poisoning   Septic shock   Liver mass, left lobe   FUO (fever of unknown origin)   Encounter for palliative care   ARF (acute  renal failure)   Sepsis    Sara Russell is a 81 y.o. female  with chronic afib, admitted for DOE, hypotension, fevers, rigors, leukocytosis with mass adjacent to liver now on MRI appears c/w maligancy   #1 Rigors, septic physiology: the liver mass is NOT an abscess by imaging and her blood and urine cultures on admission have been sterile.  It is POSSIBLE that she has had transient bacteria released into bloodstream from malignancy adjacent to her IVC but I cannot treat, prevent or  cure such phenomena with antibiotics  I therefore STOPPED IV antibiotics    I think her tumor in liver is causing her fever and possibly her autonomic instability  Would continue to observe off abx  I will sign off for now  Please call with further questions.     LOS: 7 days   Alcide Evener 09/22/2014, 7:34 PM

## 2014-09-22 NOTE — Progress Notes (Signed)
Patient Name: Sara Russell Date of Encounter: 09/22/2014     Active Problems:   HYPERCHOLESTEROLEMIA   HYPERTENSION, BENIGN SYSTEMIC   Pulmonary embolism and infarction   GERD   A-fib   Hyponatremia   AKI (acute kidney injury)   SOB (shortness of breath)   Elevated troponin   Chronic diastolic congestive heart failure   Elevated WBC count   Urinary tract infectious disease   Blood poisoning   Septic shock   Liver mass, left lobe   FUO (fever of unknown origin)   Encounter for palliative care    SUBJECTIVE  Patient feels better.  She is not dyspneic at rest on room air. Rhythm remains atrial fibrillation with mildly elevated ventricular response.  Her blood pressure is stable off of pressors.  CURRENT MEDS . amiodarone  200 mg Oral Daily  . atorvastatin  10 mg Oral q1800  . carvedilol  3.125 mg Oral BID WC  . feeding supplement (NEPRO CARB STEADY)  237 mL Oral TID BM  . furosemide  160 mg Intravenous 3 times per day  . pantoprazole  40 mg Oral Daily    OBJECTIVE  Filed Vitals:   09/22/14 0800 09/22/14 0900 09/22/14 1000 09/22/14 1100  BP: 148/73 117/78 94/62 108/53  Pulse: 111  123 51  Temp:      TempSrc:      Resp: 17 21 23 23   Height:      Weight:      SpO2: 97%  97% 97%    Intake/Output Summary (Last 24 hours) at 09/22/14 1140 Last data filed at 09/22/14 1100  Gross per 24 hour  Intake 2192.41 ml  Output   1210 ml  Net 982.41 ml   Filed Weights   09/15/14 1504 09/16/14 0547 09/16/14 1215  Weight: 148 lb 13 oz (67.5 kg) 153 lb 1.6 oz (69.446 kg) 150 lb 2.1 oz (68.1 kg)    PHYSICAL EXAM  General: Pleasant, NAD. Neuro: Alert and oriented X 3. Moves all extremities spontaneously. Psych: Normal affect. HEENT:  Normal  Neck: Supple without bruits or JVD. Lungs:  Resp regular and unlabored, CTA. Heart: Irregularly irregular Abdomen: Soft, non-tender, non-distended, BS + x 4.  Extremities: No clubbing, cyanosis or edema. DP/PT/Radials 2+ and  equal bilaterally.  Accessory Clinical Findings  CBC  Recent Labs  09/21/14 0246 09/22/14 0320  WBC 14.0* 10.0  NEUTROABS 13.2*  --   HGB 11.3* 12.1  HCT 33.0* 35.1*  MCV 86.4 84.6  PLT 92* 63*   Basic Metabolic Panel  Recent Labs  09/20/14 0351 09/21/14 0246 09/22/14 0320  NA 132* 134* 132*  132*  K 4.5 4.2 4.1  3.8  CL 104 107 101  101  CO2 14* 13* 15*  16*  GLUCOSE 98 83 73  76  BUN 40* 42* 46*  47*  CREATININE 3.37* 3.86* 4.09*  4.17*  CALCIUM 7.4* 7.3* 7.6*  7.6*  MG 2.0  --  1.8  PHOS 3.9  --  5.2*  5.2*   Liver Function Tests  Recent Labs  09/22/14 0320  ALBUMIN 1.6*   No results for input(s): LIPASE, AMYLASE in the last 72 hours. Cardiac Enzymes No results for input(s): CKTOTAL, CKMB, CKMBINDEX, TROPONINI in the last 72 hours. BNP Invalid input(s): POCBNP D-Dimer No results for input(s): DDIMER in the last 72 hours. Hemoglobin A1C No results for input(s): HGBA1C in the last 72 hours. Fasting Lipid Panel No results for input(s): CHOL, HDL, LDLCALC, TRIG, CHOLHDL, LDLDIRECT  in the last 72 hours. Thyroid Function Tests No results for input(s): TSH, T4TOTAL, T3FREE, THYROIDAB in the last 72 hours.  Invalid input(s): FREET3  TELE  Atrial fibrillation with rapid ventricular response  ECG    Radiology/Studies  Ct Abdomen Pelvis Wo Contrast  09/18/2014   CLINICAL DATA:  SepsisHx basal and squamous cell ca., diverticulosis, GERDSurgery's; D&C, tonsillectomy, appendectomy, breast lumpectomy(benign)  EXAM: CT ABDOMEN AND PELVIS WITHOUT CONTRAST  TECHNIQUE: Multidetector CT imaging of the abdomen and pelvis was performed following the standard protocol without IV contrast.  COMPARISON:  Pelvic ultrasound, 02/27/2012.  FINDINGS: Lung bases: Small pleural effusions. Lower lobe opacity most consistent with atelectasis. Component of pneumonia is possible but felt likely. 5 mm nodule right lower lobe near the oblique fissure. No pulmonary edema.  Heart mildly enlarged. Moderate size hiatal hernia.  Liver: 4.5 cm abnormality, which may reflect a mass, which lies along the posterior margin of the liver at the base caudate lobe and is inseparable from the the inferior vena cava. It also abuts the right adrenal gland. Longitudinal extent of this abnormality is approximately 6 cm.  Liver otherwise unremarkable.  Spleen: Unremarkable.  Gallbladder: Distended. No radiopaque stone. No wall thickening or adjacent inflammation. Common bile duct is dilated to approximate 1 cm but shows normal distal tapering.  Adrenal glands. The left adrenal gland thickening consistent with nodular hyperplasia. Right adrenal gland abuts the above described lesion. Is otherwise unremarkable.  Kidneys, ureters, bladder: 3.2 cm low-density lesion in the midpole of the left kidney consistent with cysts. No other renal masses. Bilateral renal cortical thinning. No stones. Bilateral perinephric stranding, likely chronic. Left ureter is dilated. There is mild prominence of the left renal pelvis without calyx dilation. No ureteral stone. Normal right ureter. Bladder is decompressed by a Foley catheter.  Uterus and adnexa: Uterus is unremarkable. There is a left adnexal water density mass measuring 6.7 x 6.1 x 7.5 cm. This was present on the prior ultrasound and is unchanged in overall size. This could be ovarian origin. It may reflect an inclusion cyst. It stability since the 2014 ultrasound strongly supports a benign etiology.  Lymph nodes:  No enlarged lymph nodes.  Ascites:  None.  Gastrointestinal: No bowel dilation to suggest obstruction. No bowel wall thickening or mesenteric inflammation.  Musculoskeletal: Degenerative changes most evident at L2-L3. Bony structures are demineralized. No osteoblastic or osteolytic lesions.  IMPRESSION: 1. Heterogeneous masslike abnormality along the posterior margin of the liver, centered on the inferior vena cava, from which it is inseparable. It abuts  the right adrenal gland. This may have an origin from the posterior margin of the liver or from the inferior vena cava. Adrenal origin is felt unlikely. This could be a neoplastic mass. This would be better evaluated with a contrast-enhanced CT scan or, if the patient can tolerate, MRI with and without contrast. 2. Small bilateral pleural effusions.  Lower lobe atelectasis. 3. Distended gallbladder without convincing acute cholecystitis. 4. No abdominal abscess. 5. Chronic left adnexal cyst measuring 7.5 cm in greatest dimension, which may be ovarian in origin or an inclusion cyst. It is similar in size to the ultrasound dated 02/27/2012. 6. No acute bowel abnormality.   Electronically Signed   By: Lajean Manes M.D.   On: 09/18/2014 12:15   Dg Chest 2 View  09/15/2014   CLINICAL DATA:  Shortness of breath. Pain when breathing for 2 days.  EXAM: CHEST  2 VIEW  COMPARISON:  03/20/2014  FINDINGS: Heart size is  mildly enlarged. Moderate hiatal hernia present. There are no focal consolidations. No pleural effusions. No pulmonary edema. Mild scoliosis.  IMPRESSION: 1. Cardiomegaly without edema. 2. Hiatal hernia.   Electronically Signed   By: Nolon Nations M.D.   On: 09/15/2014 11:31   Mr Liver Wo Contrast  09/21/2014   CLINICAL DATA:  Liver mass on CT  EXAM: MRI ABDOMEN WITHOUT CONTRAST  TECHNIQUE: Multiplanar multisequence MR imaging was performed without the administration of intravenous contrast.  COMPARISON:  CT abdomen pelvis dated 09/18/2014  FINDINGS: Severely motion degraded images.  Lower chest: Small bilateral pleural effusions. Associated bilateral lower lobe opacities, likely atelectasis.  Hepatobiliary: Liver is grossly unremarkable. Specifically, no mass is seen. The CT abnormality is distinct from the right liver.  Layering gallstones versus gallbladder sludge (series 5/ image 23). No associated inflammatory changes.  No intrahepatic ductal dilatation. Common duct is mildly dilated with a  suspected 5 mm distal CBD stone (series 5/ image 29).  Pancreas: Pancreatic atrophy. Pancreas is poorly evaluated due to motion degradation.  Spleen: Within normal limits.  Adrenals/Urinary Tract: Thickening of the left adrenal gland without discrete mass (series 5/image 26).  Right adrenal gland is incompletely visualized but mildly thickened (series 5/image 23).  2.0 x 2.5 cm well-circumscribed T2 hyperintense mass abutting the anterior aspect of the right adrenal gland and displacing the IVC anteriorly/laterally (series 5/image 20). It is unclear whether the mass is of primary adrenal origin or possibly originates along the posterior wall of the infrahepatic IVC. The lesion is new from 2011 but is poorly characterized on this motion degraded unenhanced MRI. Primary differential considerations include pheochromocytoma or sarcoma. If this is adrenal in etiology, metastasis would also be in the differential in the setting of a known primary neoplasm.  3.0 cm left lower pole renal cyst (series 11/ image 33). Additional subcentimeter right lower pole renal cyst (series 11/ image 34). Bilateral renal scarring. No hydronephrosis.  Stomach/Bowel: Stomach is notable for a moderate to large hiatal hernia.  Visualized bowel is poorly evaluated/ grossly unremarkable.  Vascular/Lymphatic: No evidence of abdominal aortic aneurysm.  Infrahepatic IVC is effaced/displaced anteriorly, as described above.  No gross abdominal lymphadenopathy.  Other: No abdominal ascites.  Retroperitoneal standing.  Moderate body wall edema.  Musculoskeletal: Degenerative changes of the visualized thoracolumbar spine.  IMPRESSION: Severely motion degraded images. Evaluation is also limited due to lack of intravenous contrast administration.  2.5 cm well-circumscribed T2 hyperintense mass located between the intrahepatic IVC and right adrenal gland, as described above, exact organ of origin unclear. Primary differential considerations include  pheochromocytoma, sarcoma, or possibly metastasis.  Liver is grossly unremarkable.  Additional ancillary findings as above.   Electronically Signed   By: Julian Hy M.D.   On: 09/21/2014 08:20   US Renal  09/22/2014   CLINICAL DATA:  Acute renal failure  EXAM: RENAL / URINARY TRACT ULTRASOUND COMPLETE  COMPARISON:  09/18/2014  FINDINGS: Right Kidney:  Length: 10.8 cm. Echogenicity within normal limits. No mass or hydronephrosis visualized.  Left Kidney:  Length: 11.6 cm. A 2.9 cm cyst is noted in the upper pole similar to that seen on prior CT examination.  Bladder:  Decompressed by Foley catheter. Note is made again of a left adnexal cyst.  IMPRESSION: Left renal cyst.  No other focal renal abnormality is noted.   Electronically Signed   By: Inez Catalina M.D.   On: 09/22/2014 11:00   Dg Chest Port 1 View  09/21/2014   CLINICAL DATA:  Acute renal insufficiency, pulmonary embolism and infarction, atrial fibrillation, CHF.  EXAM: PORTABLE CHEST - 1 VIEW  COMPARISON:  Portable chest x-ray of September 17, 2014 and PA and lateral chest x-ray of August 05, 2013  FINDINGS: The lungs are mildly hypoinflated greater on the right than on the left. The cardiac silhouette is enlarged. The pulmonary vascularity is not engorged. A trace of pleural fluid may be present on the right. There is no alveolar infiltrate. There is density in the retrocardiac region which likely reflects a hiatal hernia given the appearance on the previous studies. The observed bony thorax is unremarkable.  IMPRESSION: There is mild hypoinflation greater on the right than on the left. There is no acute cardiopulmonary abnormality.   Electronically Signed   By: David  Martinique M.D.   On: 09/21/2014 07:22   Dg Chest Port 1 View  09/17/2014   CLINICAL DATA:  Respiratory distress.  EXAM: PORTABLE CHEST - 1 VIEW  COMPARISON:  09/16/2014.  FINDINGS: Cardiac silhouette is mildly enlarged. Opacity in the retrocardiac region is likely moderate hiatal  hernia. No hilar masses or adenopathy.  No lung consolidation or edema. No pleural effusion or pneumothorax.  Bony thorax is demineralized but grossly intact.  No significant change from the previous day's study.  IMPRESSION: No acute cardiopulmonary disease.   Electronically Signed   By: Lajean Manes M.D.   On: 09/17/2014 19:47   Dg Chest Port 1 View  09/16/2014   CLINICAL DATA:  Shortness of Breath  EXAM: PORTABLE CHEST - 1 VIEW  COMPARISON:  September 15, 2014  FINDINGS: There is no edema or consolidation. Heart is upper normal in size with pulmonary vascularity within normal limits. Moderate hiatal hernia is present. There is atherosclerotic change in the aorta. No adenopathy. No bone lesions.  IMPRESSION: No edema or consolidation. Moderate hiatal hernia. Atherosclerotic calcification in aorta.   Electronically Signed   By: Lowella Grip III M.D.   On: 09/16/2014 11:20    ASSESSMENT AND PLAN  1. Chronic atrial fibrillation with rapid ventricular response 2. Hypotension, improved.  She is off pressors. 3. Renal insufficiency 4. Perihepatic mass, likely cancer  Plan: For rate control of her atrial fibrillation we will use carvedilol starting at a low dose of 3.125 mg twice a day.  She has mildly depressed left ventricular systolic function.  We will start to cut back on her amiodarone.  She has a history of chronic permanent atrial fibrillation.  Signed, Warren Danes MD

## 2014-09-22 NOTE — Consult Note (Signed)
Physical Medicine and Rehabilitation Consult Reason for Consult: Sepsis/multi-medical Referring Physician: Family medicine   HPI: Sara Russell is a 81 y.o. right handed female with history of hypertension, atrial fibrillation, pulmonary emboli/DVT maintained on Eliquis, systolic congestive heart failure. Independent with a walker prior to admission living alone. Patient had just signed contracts to enter [facility] independent living facility. Presented 09/15/2014 with increasing shortness of breath and dizziness on exertion. Denied chest pain or fever. Chest x-ray noted cardiomegaly left without effusion or pulmonary edema. Creatinine elevated 2.25 as well as hyponatremia 128. White blood cell count 12,100. Troponin mildly elevated 0.28. Urinalysis negative. Cardiology service is consulted for elevated troponins with echocardiogram showing ejection fraction of 50% as well as septal hypokinesis. Serial follow-up troponins placed on amiodarone for cardiac rate control. Patient spiked a fever 104.7 rectally and increasing respiratory distress 09/16/2014 with rapid response contacted and placed on BiPAP.. Maintained on broad-spectrum anti-biotics. Nephrology services consulted in light of elevated creatinine suspect acute renal failure secondary to ischemic ATN from bouts of hypotension as well as ACE inhibitor that was discontinued. CT abdomen and pelvis showed questionable liver abscess versus mass. MRI of the liver shows a 2.5 cm well circumscribed hyperintense mass located between the intrahepatic IVC and right adrenal gland. Primary differential considerations included pheochromocytoma, sarcoma or possibly metastasis with workup ongoing. Infectious disease consulted suspect sepsis versus transient bacteremia and currently maintained on intravenous Merrem. Renal functions continued to improve latest creatinine 0.59. Physical therapy evaluation completed 09/21/2014 with noted deconditioning and  recommendations of physical medicine rehabilitation consult.  Still on Neo-Synephrine drip Review of Systems  Constitutional: Positive for fever and chills.  Eyes: Negative for blurred vision and double vision.  Respiratory: Negative for cough.        Shortness of breath on exertion  Cardiovascular: Positive for palpitations. Negative for chest pain.  Gastrointestinal: Positive for nausea and constipation.       GERD  Genitourinary: Positive for dysuria and hematuria.  Musculoskeletal: Positive for myalgias and joint pain.  Skin: Negative for rash.  Neurological: Positive for dizziness and weakness. Negative for headaches.   Past Medical History  Diagnosis Date  . S/P dilatation of esophageal stricture 2002  . Hypertension   . Hyperlipidemia   . DVT (deep venous thrombosis) 2009  . PE (pulmonary embolism) 2009  . GERD (gastroesophageal reflux disease)   . Diverticulosis   . Iron deficiency anemia   . Hyperplastic colon polyp   . Positive H. pylori test 01/20/96  . Degenerative joint disease   . Skin cancer     h/o basal and squamous skin cancer--> removed  . Persistent atrial fibrillation   . Shortness of breath dyspnea   . AKI (acute kidney injury) 09/15/2014  . Colitis, ulcerative    Past Surgical History  Procedure Laterality Date  . Dilation and curettage of uterus      for endometrial polyps  . Replacement total knee      left  . Tonsillectomy    . Appendectomy    . Breast lumpectomy      benign  . Joint replacement     Family History  Problem Relation Age of Onset  . Esophageal cancer Brother   . Diverticulitis Brother   . Colon cancer Neg Hx   . Heart disease Mother   . Stroke Mother    Social History:  reports that she quit smoking about 53 years ago. Her smoking use included Cigarettes. She started smoking  about 66 years ago. She has a 6.5 pack-year smoking history. She has never used smokeless tobacco. She reports that she drinks alcohol. She reports  that she does not use illicit drugs. Allergies:  Allergies  Allergen Reactions  . Iohexol Other (See Comments)    unknown  . Ivp Dye [Iodinated Diagnostic Agents] Hives  . Scallops [Shellfish Allergy] Other (See Comments)    Projectile vomiting.  . Sulfa Antibiotics Hives  . Sulfamethoxazole Hives    REACTION: unspecified   Medications Prior to Admission  Medication Sig Dispense Refill  . acetaminophen (TYLENOL) 650 MG CR tablet Take 650 mg by mouth every 8 (eight) hours as needed for pain.    Marland Kitchen atorvastatin (LIPITOR) 10 MG tablet TAKE ONE TABLET BY MOUTH ONCE DAILY AT  6PM 90 tablet 3  . benazepril (LOTENSIN) 40 MG tablet TAKE ONE TABLET BY MOUTH ONCE DAILY 90 tablet 3  . calcium carbonate (TUMS - DOSED IN MG ELEMENTAL CALCIUM) 500 MG chewable tablet Chew 1 tablet by mouth 2 (two) times daily.      . calcium-vitamin D (SM CALCIUM-VITAMIN D) 500-200 MG-UNIT per tablet Take 1 tablet by mouth 2 (two) times daily.     Marland Kitchen diltiazem (CARDIZEM CD) 180 MG 24 hr capsule Take 1 capsule (180 mg total) by mouth daily. 90 capsule 3  . ELIQUIS 5 MG TABS tablet TAKE 1 TABLET TWICE A DAY 180 tablet 0  . ferrous sulfate 325 (65 FE) MG tablet Take 1 tablet (325 mg total) by mouth daily with breakfast. 1 tablet 0  . metoprolol (LOPRESSOR) 100 MG tablet TAKE ONE TABLET BY MOUTH TWICE DAILY 180 tablet 3  . omeprazole (PRILOSEC) 40 MG capsule TAKE ONE CAPSULE BY MOUTH ONCE DAILY 90 capsule 3  . traMADol (ULTRAM) 50 MG tablet TAKE ONE TABLET BY MOUTH EVERY 6 HOURS AS NEEDED FOR PAIN 180 tablet 1    Home: Home Living Family/patient expects to be discharged to:: Private residence Living Arrangements: Alone Available Help at Discharge: Family, Available 24 hours/day (sons can help, pt has d/c'd to their homes in past prn) Type of Home: House Home Access: Stairs to enter [employer] of Steps: 4 Entrance Stairs-Rails: Can reach both, Left, Right Home Layout: Multi-level, 1/2 bath on main level,  Bed/bath upstairs Alternate Level Stairs-Number of Steps: 5 Alternate Level Stairs-Rails: None Bathroom Shower/Tub: Gaffer, Door [employer] Toilet: Standard Bathroom Accessibility: Yes Home Equipment: Environmental consultant - 2 wheels, Bedside commode, Cane - single point Additional Comments: See comments section above for info about pt d/c plan.  Functional History: Prior Function Level of Independence: Independent Functional Status:  Mobility: Bed Mobility General bed mobility comments: in chair on arrival Transfers Overall transfer level: Needs assistance Equipment used: 2 person hand held assist Transfers: Sit to/from Stand Sit to Stand: Mod assist, +2 physical assistance General transfer comment: Pt used hands to power up but needed mod assist to physically stand all the way up.        ADL:    Cognition: Cognition Overall Cognitive Status: Within Functional Limits for tasks assessed Orientation Level: Oriented X4 Cognition Arousal/Alertness: Awake/alert Behavior During Therapy: WFL for tasks assessed/performed Overall Cognitive Status: Within Functional Limits for tasks assessed  Blood pressure 106/59, pulse 98, temperature 97.6 F (36.4 C), temperature source Axillary, resp. rate 0, height 5' 1"  (1.549 m), weight 68.1 kg (150 lb 2.1 oz), SpO2 96 %. Physical Exam  Constitutional: She is oriented to person, place, and time.  HENT:  Head: Normocephalic.  Eyes: EOM are normal.  Neck: Normal range of motion. Neck supple. No thyromegaly present.  Cardiovascular:  Cardiac rate controlled  Respiratory: Effort normal and breath sounds normal. No respiratory distress.  GI: Soft. Bowel sounds are normal. She exhibits no distension. There is no tenderness.  Neurological: She is alert and oriented to person, place, and time.  Skin: Skin is warm and dry.  Motor strength is 4/5 bilateral deltoids, biceps, triceps, grip, hip flexor, knee extensor, ankle dorsiflexor and plantar  flexor Sensation is intact to light touch in bilateral upper and lower limbs Mood and affect are appropriate There is no evidence of dysarthria Coordination appears normal bilateral upper and lower limbs  Results for orders placed or performed during the hospital encounter of 09/15/14 (from the past 24 hour(s))  Renal function panel     Status: Abnormal   Collection Time: 09/22/14  3:20 AM  Result Value Ref Range   Sodium 132 (L) 135 - 145 mmol/L   Potassium 3.8 3.5 - 5.1 mmol/L   Chloride 101 101 - 111 mmol/L   CO2 16 (L) 22 - 32 mmol/L   Glucose, Bld 76 65 - 99 mg/dL   BUN 47 (H) 6 - 20 mg/dL   Creatinine, Ser 4.17 (H) 0.44 - 1.00 mg/dL   Calcium 7.6 (L) 8.9 - 10.3 mg/dL   Phosphorus 5.2 (H) 2.5 - 4.6 mg/dL   Albumin 1.6 (L) 3.5 - 5.0 g/dL   GFR calc non Af Amer 9 (L) >60 mL/min   GFR calc Af Amer 11 (L) >60 mL/min   Anion gap 15 5 - 15  CBC     Status: Abnormal (Preliminary result)   Collection Time: 09/22/14  3:20 AM  Result Value Ref Range   WBC PENDING 4.0 - 10.5 K/uL   RBC 4.15 3.87 - 5.11 MIL/uL   Hemoglobin 12.1 12.0 - 15.0 g/dL   HCT 35.1 (L) 36.0 - 46.0 %   MCV 84.6 78.0 - 100.0 fL   MCH 29.2 26.0 - 34.0 pg   MCHC 34.5 30.0 - 36.0 g/dL   RDW 14.4 11.5 - 15.5 %   Platelets PENDING 150 - 400 K/uL  Basic metabolic panel     Status: Abnormal   Collection Time: 09/22/14  3:20 AM  Result Value Ref Range   Sodium 132 (L) 135 - 145 mmol/L   Potassium 4.1 3.5 - 5.1 mmol/L   Chloride 101 101 - 111 mmol/L   CO2 15 (L) 22 - 32 mmol/L   Glucose, Bld 73 65 - 99 mg/dL   BUN 46 (H) 6 - 20 mg/dL   Creatinine, Ser 4.09 (H) 0.44 - 1.00 mg/dL   Calcium 7.6 (L) 8.9 - 10.3 mg/dL   GFR calc non Af Amer 9 (L) >60 mL/min   GFR calc Af Amer 11 (L) >60 mL/min   Anion gap 16 (H) 5 - 15  Magnesium     Status: None   Collection Time: 09/22/14  3:20 AM  Result Value Ref Range   Magnesium 1.8 1.7 - 2.4 mg/dL  Phosphorus     Status: Abnormal   Collection Time: 09/22/14  3:20 AM   Result Value Ref Range   Phosphorus 5.2 (H) 2.5 - 4.6 mg/dL   Mr Liver Wo Contrast  09/21/2014   CLINICAL DATA:  Liver mass on CT  EXAM: MRI ABDOMEN WITHOUT CONTRAST  TECHNIQUE: Multiplanar multisequence MR imaging was performed without the administration of intravenous contrast.  COMPARISON:  CT abdomen pelvis dated  09/18/2014  FINDINGS: Severely motion degraded images.  Lower chest: Small bilateral pleural effusions. Associated bilateral lower lobe opacities, likely atelectasis.  Hepatobiliary: Liver is grossly unremarkable. Specifically, no mass is seen. The CT abnormality is distinct from the right liver.  Layering gallstones versus gallbladder sludge (series 5/ image 23). No associated inflammatory changes.  No intrahepatic ductal dilatation. Common duct is mildly dilated with a suspected 5 mm distal CBD stone (series 5/ image 29).  Pancreas: Pancreatic atrophy. Pancreas is poorly evaluated due to motion degradation.  Spleen: Within normal limits.  Adrenals/Urinary Tract: Thickening of the left adrenal gland without discrete mass (series 5/image 26).  Right adrenal gland is incompletely visualized but mildly thickened (series 5/image 23).  2.0 x 2.5 cm well-circumscribed T2 hyperintense mass abutting the anterior aspect of the right adrenal gland and displacing the IVC anteriorly/laterally (series 5/image 20). It is unclear whether the mass is of primary adrenal origin or possibly originates along the posterior wall of the infrahepatic IVC. The lesion is new from 2011 but is poorly characterized on this motion degraded unenhanced MRI. Primary differential considerations include pheochromocytoma or sarcoma. If this is adrenal in etiology, metastasis would also be in the differential in the setting of a known primary neoplasm.  3.0 cm left lower pole renal cyst (series 11/ image 33). Additional subcentimeter right lower pole renal cyst (series 11/ image 34). Bilateral renal scarring. No hydronephrosis.   Stomach/Bowel: Stomach is notable for a moderate to large hiatal hernia.  Visualized bowel is poorly evaluated/ grossly unremarkable.  Vascular/Lymphatic: No evidence of abdominal aortic aneurysm.  Infrahepatic IVC is effaced/displaced anteriorly, as described above.  No gross abdominal lymphadenopathy.  Other: No abdominal ascites.  Retroperitoneal standing.  Moderate body wall edema.  Musculoskeletal: Degenerative changes of the visualized thoracolumbar spine.  IMPRESSION: Severely motion degraded images. Evaluation is also limited due to lack of intravenous contrast administration.  2.5 cm well-circumscribed T2 hyperintense mass located between the intrahepatic IVC and right adrenal gland, as described above, exact organ of origin unclear. Primary differential considerations include pheochromocytoma, sarcoma, or possibly metastasis.  Liver is grossly unremarkable.  Additional ancillary findings as above.   Electronically Signed   By: Julian Hy M.D.   On: 09/21/2014 08:20   Dg Chest Port 1 View  09/21/2014   CLINICAL DATA:  Acute renal insufficiency, pulmonary embolism and infarction, atrial fibrillation, CHF.  EXAM: PORTABLE CHEST - 1 VIEW  COMPARISON:  Portable chest x-ray of September 17, 2014 and [place] and lateral chest x-ray of August 05, 2013  FINDINGS: The lungs are mildly hypoinflated greater on the right than on the left. The cardiac silhouette is enlarged. The pulmonary vascularity is not engorged. A trace of pleural fluid may be present on the right. There is no alveolar infiltrate. There is density in the retrocardiac region which likely reflects a hiatal hernia given the appearance on the previous studies. The observed bony thorax is unremarkable.  IMPRESSION: There is mild hypoinflation greater on the right than on the left. There is no acute cardiopulmonary abnormality.   Electronically Signed   By: David  Martinique M.D.   On: 09/21/2014 07:22    Assessment/Plan: Diagnosis: Deconditioning related  to sepsis with acute renal failure 1. Does the need for close, 24 hr/day medical supervision in concert with the patient's rehab needs make it unreasonable for this patient to be served in a less intensive setting? Yes 2. Co-Morbidities requiring supervision/potential complications: Atrial fibrillation, congestive heart failure, history of hypertension however now  is hypotensive 3. Due to bladder management, bowel management, safety, skin/wound care, disease management, medication administration, pain management and patient education, does the patient require 24 hr/day rehab nursing? Yes 4. Does the patient require coordinated care of a physician, rehab nurse, PT (1-2 hrs/day, 5 days/week) and OT (1-2 hrs/day, 5 days/week) to address physical and functional deficits in the context of the above medical diagnosis(es)? Yes Addressing deficits in the following areas: balance, endurance, locomotion, strength, transferring, bowel/bladder control, bathing, dressing, feeding, grooming and toileting 5. Can the patient actively participate in an intensive therapy program of at least 3 hrs of therapy per day at least 5 days per week? No 6. The potential for patient to make measurable gains while on inpatient rehab is good once medically stable 7. Anticipated functional outcomes upon discharge from inpatient rehab are supervision  with PT, supervision with OT, n/a with SLP. 8. Estimated rehab length of stay to reach the above functional goals is: 10-14 days 9. Does the patient have adequate social supports and living environment to accommodate these discharge functional goals? Potentially 10. Anticipated D/C setting: Home 11. Anticipated post D/C treatments: [facility] therapy 12. Overall Rehab/Functional Prognosis: good  RECOMMENDATIONS: This patient's condition is appropriate for continued rehabilitative care in the following setting: CIR once off IV pressors with stable renal function Patient has agreed to  participate in recommended program. Potentially Note that insurance prior authorization may be required for reimbursement for recommended care.  Comment:     09/22/2014

## 2014-09-22 NOTE — Progress Notes (Signed)
S: Appetite poor but agreeable to nepro O:BP 125/74 mmHg  Pulse 127  Temp(Src) 97.3 F (36.3 C) (Oral)  Resp 24  Ht 5' 1"  (1.549 m)  Wt 68.1 kg (150 lb 2.1 oz)  BMI 28.38 kg/m2  SpO2 97%  Intake/Output Summary (Last 24 hours) at 09/22/14 0819 Last data filed at 09/22/14 0700  Gross per 24 hour  Intake 1899.98 ml  Output    955 ml  Net 944.98 ml   Weight change:  HOZ:YYQMG and alert NOI:BBCWU, irreg Resp:Few faint basilar crackles Abd:+ BS NTND Ext:tr edema NEURO:CNI Ox3 no asterixis   . amiodarone  400 mg Oral BID  . atorvastatin  10 mg Oral q1800  . furosemide  40 mg Intravenous Q6H  . pantoprazole  40 mg Oral Daily   Mr Liver Wo Contrast  09/21/2014   CLINICAL DATA:  Liver mass on CT  EXAM: MRI ABDOMEN WITHOUT CONTRAST  TECHNIQUE: Multiplanar multisequence MR imaging was performed without the administration of intravenous contrast.  COMPARISON:  CT abdomen pelvis dated 09/18/2014  FINDINGS: Severely motion degraded images.  Lower chest: Small bilateral pleural effusions. Associated bilateral lower lobe opacities, likely atelectasis.  Hepatobiliary: Liver is grossly unremarkable. Specifically, no mass is seen. The CT abnormality is distinct from the right liver.  Layering gallstones versus gallbladder sludge (series 5/ image 23). No associated inflammatory changes.  No intrahepatic ductal dilatation. Common duct is mildly dilated with a suspected 5 mm distal CBD stone (series 5/ image 29).  Pancreas: Pancreatic atrophy. Pancreas is poorly evaluated due to motion degradation.  Spleen: Within normal limits.  Adrenals/Urinary Tract: Thickening of the left adrenal gland without discrete mass (series 5/image 26).  Right adrenal gland is incompletely visualized but mildly thickened (series 5/image 23).  2.0 x 2.5 cm well-circumscribed T2 hyperintense mass abutting the anterior aspect of the right adrenal gland and displacing the IVC anteriorly/laterally (series 5/image 20). It is unclear  whether the mass is of primary adrenal origin or possibly originates along the posterior wall of the infrahepatic IVC. The lesion is new from 2011 but is poorly characterized on this motion degraded unenhanced MRI. Primary differential considerations include pheochromocytoma or sarcoma. If this is adrenal in etiology, metastasis would also be in the differential in the setting of a known primary neoplasm.  3.0 cm left lower pole renal cyst (series 11/ image 33). Additional subcentimeter right lower pole renal cyst (series 11/ image 34). Bilateral renal scarring. No hydronephrosis.  Stomach/Bowel: Stomach is notable for a moderate to large hiatal hernia.  Visualized bowel is poorly evaluated/ grossly unremarkable.  Vascular/Lymphatic: No evidence of abdominal aortic aneurysm.  Infrahepatic IVC is effaced/displaced anteriorly, as described above.  No gross abdominal lymphadenopathy.  Other: No abdominal ascites.  Retroperitoneal standing.  Moderate body wall edema.  Musculoskeletal: Degenerative changes of the visualized thoracolumbar spine.  IMPRESSION: Severely motion degraded images. Evaluation is also limited due to lack of intravenous contrast administration.  2.5 cm well-circumscribed T2 hyperintense mass located between the intrahepatic IVC and right adrenal gland, as described above, exact organ of origin unclear. Primary differential considerations include pheochromocytoma, sarcoma, or possibly metastasis.  Liver is grossly unremarkable.  Additional ancillary findings as above.   Electronically Signed   By: Julian Hy M.D.   On: 09/21/2014 08:20   Dg Chest Port 1 View  09/21/2014   CLINICAL DATA:  Acute renal insufficiency, pulmonary embolism and infarction, atrial fibrillation, CHF.  EXAM: PORTABLE CHEST - 1 VIEW  COMPARISON:  Portable chest x-ray  of September 17, 2014 and [place] and lateral chest x-ray of August 05, 2013  FINDINGS: The lungs are mildly hypoinflated greater on the right than on the left. The  cardiac silhouette is enlarged. The pulmonary vascularity is not engorged. A trace of pleural fluid may be present on the right. There is no alveolar infiltrate. There is density in the retrocardiac region which likely reflects a hiatal hernia given the appearance on the previous studies. The observed bony thorax is unremarkable.  IMPRESSION: There is mild hypoinflation greater on the right than on the left. There is no acute cardiopulmonary abnormality.   Electronically Signed   By: David  Martinique M.D.   On: 09/21/2014 07:22   BMET    Component Value Date/Time   NA 132* 09/22/2014 0320   NA 132* 09/22/2014 0320   K 3.8 09/22/2014 0320   K 4.1 09/22/2014 0320   CL 101 09/22/2014 0320   CL 101 09/22/2014 0320   CO2 16* 09/22/2014 0320   CO2 15* 09/22/2014 0320   GLUCOSE 76 09/22/2014 0320   GLUCOSE 73 09/22/2014 0320   BUN 47* 09/22/2014 0320   BUN 46* 09/22/2014 0320   CREATININE 4.17* 09/22/2014 0320   CREATININE 4.09* 09/22/2014 0320   CREATININE 0.83 09/10/2013 1026   CALCIUM 7.6* 09/22/2014 0320   CALCIUM 7.6* 09/22/2014 0320   GFRNONAA 9* 09/22/2014 0320   GFRNONAA 9* 09/22/2014 0320   GFRNONAA 54* 05/18/2012 0839   GFRAA 11* 09/22/2014 0320   GFRAA 11* 09/22/2014 0320   GFRAA 63 05/18/2012 0839   CBC    Component Value Date/Time   WBC 10.0 09/22/2014 0320   RBC 4.15 09/22/2014 0320   RBC 4.03 07/06/2009 2249   HGB 12.1 09/22/2014 0320   HGB 14.2 08/28/2010 1041   HCT 35.1* 09/22/2014 0320   PLT 63* 09/22/2014 0320   MCV 84.6 09/22/2014 0320   MCH 29.2 09/22/2014 0320   MCHC 34.5 09/22/2014 0320   RDW 14.4 09/22/2014 0320   LYMPHSABS 0.3* 09/21/2014 0246   MONOABS 0.5 09/21/2014 0246   EOSABS 0.0 09/21/2014 0246   BASOSABS 0.0 09/21/2014 0246     Assessment:  1. ARF sec ischemic ATN in face of ACE, UO minimal, Scr rising 2. Mass between Rt adrenal gland and IVC 3. Chronic A fib 4. Met acidosis on bicarb gtt  Plan: 1. Cont IV bicarb 2. Increase lasix back  to the dose I ordered 3. Nepro 4. Daily labs  Jaicee Michelotti T

## 2014-09-22 NOTE — Progress Notes (Addendum)
Assessed patient at bedside.  Off vasopressors since early am.  DNR/DNI.  Remains on bicarb gtt per Nephrology.  Off bipap, VSS.  No acute distress.     Plan: Transfer to SDU Transfer primary SVC back to FPTS as of am 8/12.     Noe Gens, NP-C  Pulmonary & Critical Care Pgr: [phone] or if no answer [phone] 09/22/2014, 10:28 AM

## 2014-09-22 NOTE — Progress Notes (Signed)
eLink Physician-Brief Progress Note Patient Name: Sara Russell DOB: 28-Dec-1930 MRN: [id number]   Date of Service  09/22/2014  HPI/Events of Note  Multiple loose stools per report  eICU Interventions  Check for C diff before ordering meds     Intervention Category Intermediate Interventions: Infection - evaluation and management Minor Interventions: Other: Evaluation Type: New Patient Evaluation  Flora Lipps 09/22/2014, 11:26 PM

## 2014-09-22 NOTE — Consult Note (Signed)
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 Known history of atrial fibrillation on systemic and coagulation with Eliquis. Also has a known history of PE/DVT in 2009. TTE (8/4) - EF 45-50%, septal hypokinesis, severely dilated LA, mod/severe TR, PA peak pressure =57mHg Portable CXR (8Sep 04, 2024 - No effusion or opacity. Hiatal hernia noted. 809/04/24- transfer to icu w/ respiratory failure & a fib w/ rvr 8/6 hypothermic with rigors 8/7 CT ab/pel > heterogenous masslike abnormality along posterior liver, large, unclear if mass or abscess, small bilat effusions, distended gallbladder, chronic adnexal cyst. Patient required neo drip, she has been off of the neo since early this am on 09-22-14. Other active hospital problems include renal failure, nephrology is following, patient is on Bicarb drip and lasix. Code status established as DNR. Palliative consult requested for further discussions  The patient is resting in bed, she slept well, she feels like she is making some urine, denies any pain, she is in no distress, her 2 sons are at the bedside. The patient is resting comfortably. She states she is off the neo drip since early this am. She complains of poor appetite, she states she has discussed with nephrology about starting nepro.  Introduced scope of palliative medicine as a mode of care to focus on symptom management and to facilitate discussions and decision making. The patient has no symptoms. She is encouraged that she is off pressors at the moment. The patient is also given some information  about hospice. The patient states she is familiar with hospice, she had a good experience with hospice care for her husband who died of pancreatic cancer 5 years ago, patient is tearful when discussing about her deceased husband. Offered support and active listening, voiced understanding.   Palliative will follow along peripherally, the patient wishes to continue with current mode of care for now. All questions answered.   Contacts/Participants in Discussion: Primary Decision Maker:  Patient herself, then her son JClair Gulling   HCPOA: yes   3 sons  Code Status/Advance Care Planning:  DNR DNI  Symptom Management:   No acute symptoms, continue to follow.   Palliative Prophylaxis: yes  Additional Recommendations (Limitations, Scope, Preferences):  Continue current mode of care, palliative will follow the hospital course peripherally, will engage in discussions pertaining to comfort measures etc if the patient has acute decline on top of her current active problems.   Psycho-social/Spiritual:   Support System: strong, her 3 sons.   Desire for further Chaplaincy support:no  Prognosis: Unable to determine  Discharge Planning:  pending clinical course    Domains of Care: - Physical: no pain no dyspnea - Psychological: no acute distress. - Social: widowed, has 3 sons, is a retired RTherapist, sports- Spiritual: no acute issues noted in initial encounter - [place]. Patient is tearful when she remembers her husband, he died in late A09-04-24years ago with sudden diagnosis of pancreatic cancer, she had hospice care for him, it was a positive experience with hospice - Imminently dying: no but prognosis guarded given multiple serious conditions  such as renal failure, A fib, recent diagnosis of mass in abdomen.  - Ethical/Legal: DNR DNI  Values: continue current life maintaining treatment.  Life limiting illness: newly diagnosed mass in abdomen, renal failure, A fib.         Chief Complaint/History of Present Illness:   dyspnea Primary Diagnoses  Present on Admission:  . AKI (acute kidney injury) . HYPERCHOLESTEROLEMIA . HYPERTENSION, BENIGN SYSTEMIC . GERD . Pulmonary embolism and infarction . A-fib . Hyponatremia . SOB (shortness of breath)  Palliative Review of Systems: Completed. Positive for anorexia, poor appetite. No insomnia, mood congruent.  I have reviewed the medical record, interviewed the patient and family, and examined the patient. The following aspects are pertinent.  Past Medical History  Diagnosis Date  . S/P dilatation of esophageal stricture 2002  . Hypertension   . Hyperlipidemia   . DVT (deep venous thrombosis) 2009  . PE (pulmonary embolism) 2009  . GERD (gastroesophageal reflux disease)   . Diverticulosis   . Iron deficiency anemia   . Hyperplastic colon polyp   . Positive H. pylori test 01/20/96  . Degenerative joint disease   . Skin cancer     h/o basal and squamous skin cancer--> removed  . Persistent atrial fibrillation   . Shortness of breath dyspnea   . AKI (acute kidney injury) 09/15/2014  . Colitis, ulcerative    Social History   Social History  . Marital Status: Widowed    Spouse Name: N/A  . Number of Children: 3  . Years of Education: college   Occupational History  . retired Marine scientist    Social History Main Topics  . Smoking status: Former Smoker -- 0.50 packs/day for 13 years    Types: Cigarettes    Start date: 02/12/1948    Quit date: 02/11/1961  . Smokeless tobacco: Never Used     Comment: Denies use of tobacco since 1963  . Alcohol Use: 0.0 oz/week    7-14 Glasses of wine per week     Comment: 1-2 glasses of wine per day  . Drug Use: No  . Sexual Activity: Not Currently   Other Topics Concern  . None   Social History Narrative   Health Care POA:    Emergency Contact: son, Marelin Tat (c) [phone] [name])   End of Life Plan:    Who lives with you: self   Any pets: none    Diet: Pt has a varied diet of protein, starch, vegetables.   Exercise: Pt has no regular exercise plan because of pain in back.   Seatbelts: Pt reports wearing seatbelt when in vehicle.   Nancy Fetter Exposure/Protection: Pt reports wearing sun screen.   Hobbies: reading, traveling, eating out.      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 Esophageal cancer Brother   . Diverticulitis Brother   . Colon cancer Neg Hx   . Heart disease Mother   . Stroke Mother    Scheduled Meds: . amiodarone  400 mg Oral BID  . atorvastatin  10 mg Oral q1800  . feeding supplement (NEPRO CARB STEADY)  237 mL Oral TID BM  . furosemide  160 mg Intravenous 3 times per day  . pantoprazole  40 mg Oral Daily   Continuous Infusions: . sodium chloride Stopped (09/17/14  1723)  . sodium chloride Stopped (09/20/14 1930)  . phenylephrine (NEO-SYNEPHRINE) Adult infusion Stopped (09/22/14 0415)  .  sodium bicarbonate 150 mEq in sterile water 1000 mL infusion 50 mL/hr at 09/21/14 0949   PRN Meds:.acetaminophen **OR** acetaminophen, metoprolol, morphine injection, naLOXone (NARCAN)  injection Medications Prior to Admission:  Prior to Admission medications   Medication Sig Start Date End Date Taking? Authorizing Provider  acetaminophen (TYLENOL) 650 MG CR tablet Take 650 mg by mouth every 8 (eight) hours as needed for pain.   Yes Historical Provider, MD  atorvastatin (LIPITOR) 10 MG tablet TAKE ONE TABLET BY MOUTH ONCE DAILY AT  6PM 08/29/14  Yes Zenia Resides, MD  benazepril (LOTENSIN) 40 MG tablet TAKE ONE TABLET BY MOUTH ONCE DAILY 06/24/14  Yes Zenia Resides, MD  calcium carbonate (TUMS - DOSED IN MG ELEMENTAL CALCIUM) 500 MG chewable tablet Chew 1 tablet by mouth 2 (two) times daily.     Yes Historical Provider, MD  calcium-vitamin D (SM CALCIUM-VITAMIN D)  500-200 MG-UNIT per tablet Take 1 tablet by mouth 2 (two) times daily.    Yes Historical Provider, MD  diltiazem (CARDIZEM CD) 180 MG 24 hr capsule Take 1 capsule (180 mg total) by mouth daily. 12/03/13  Yes Zenia Resides, MD  ELIQUIS 5 MG TABS tablet TAKE 1 TABLET TWICE A DAY 07/21/14  Yes Thompson Grayer, MD  ferrous sulfate 325 (65 FE) MG tablet Take 1 tablet (325 mg total) by mouth daily with breakfast. 06/04/13  Yes Zenia Resides, MD  metoprolol (LOPRESSOR) 100 MG tablet TAKE ONE TABLET BY MOUTH TWICE DAILY 01/04/14  Yes Zenia Resides, MD  omeprazole (PRILOSEC) 40 MG capsule TAKE ONE CAPSULE BY MOUTH ONCE DAILY 06/14/14  Yes Zenia Resides, MD  traMADol (ULTRAM) 50 MG tablet TAKE ONE TABLET BY MOUTH EVERY 6 HOURS AS NEEDED FOR PAIN 08/29/14  Yes Zenia Resides, MD   Allergies  Allergen Reactions  . Iohexol Other (See Comments)    unknown  . Ivp Dye [Iodinated Diagnostic Agents] Hives  . Scallops [Shellfish Allergy] Other (See Comments)    Projectile vomiting.  . Sulfa Antibiotics Hives  . Sulfamethoxazole Hives    REACTION: unspecified   CBC:    Component Value Date/Time   WBC 10.0 09/22/2014 0320   HGB 12.1 09/22/2014 0320   HGB 14.2 08/28/2010 1041   HCT 35.1* 09/22/2014 0320   PLT 63* 09/22/2014 0320   MCV 84.6 09/22/2014 0320   NEUTROABS 13.2* 09/21/2014 0246   LYMPHSABS 0.3* 09/21/2014 0246   MONOABS 0.5 09/21/2014 0246   EOSABS 0.0 09/21/2014 0246   BASOSABS 0.0 09/21/2014 0246   Comprehensive Metabolic Panel:    Component Value Date/Time   NA 132* 09/22/2014 0320   NA 132* 09/22/2014 0320   K 3.8 09/22/2014 0320   K 4.1 09/22/2014 0320   CL 101 09/22/2014 0320   CL 101 09/22/2014 0320   CO2 16* 09/22/2014 0320   CO2 15* 09/22/2014 0320   BUN 47* 09/22/2014 0320   BUN 46* 09/22/2014 0320   CREATININE 4.17* 09/22/2014 0320   CREATININE 4.09* 09/22/2014 0320   CREATININE 0.83 09/10/2013 1026   GLUCOSE 76 09/22/2014 0320   GLUCOSE 73 09/22/2014 0320    CALCIUM 7.6* 09/22/2014 0320   CALCIUM 7.6* 09/22/2014 0320   AST 29 09/19/2014 0554   ALT 19 09/19/2014 0554   ALKPHOS 174* 09/19/2014 0554   BILITOT 0.7 09/19/2014 0554   PROT 5.7* 09/19/2014 9629  ALBUMIN 1.6* 09/22/2014 0320    Physical Exam: Vital Signs: BP 125/74 mmHg  Pulse 127  Temp(Src) 97.3 F (36.3 C) (Oral)  Resp 24  Ht 5' 1"  (1.549 m)  Wt 68.1 kg (150 lb 2.1 oz)  BMI 28.38 kg/m2  SpO2 97% SpO2: SpO2: 97 % O2 Device: O2 Device: Not Delivered O2 Flow Rate: O2 Flow Rate (L/min): 2 L/min Intake/output summary:  Intake/Output Summary (Last 24 hours) at 09/22/14 0913 Last data filed at 09/22/14 0700  Gross per 24 hour  Intake 1798.88 ml  Output    955 ml  Net 843.88 ml   LBM: Last BM Date: 09/21/14 Baseline Weight: Weight: 65.772 kg (145 lb) Most recent weight: Weight: 68.1 kg (150 lb 2.1 oz)  Exam Findings:   weak pale appearing elderly lady in no distress denies pain Irregular Abdomen soft non tender Edema bilateral UE and LE Awake alert NAD Clear upper anterior                       Palliative Performance Scale: 30% Additional Data Reviewed: Recent Labs     09/21/14  0246  09/22/14  0320  WBC  14.0*  10.0  HGB  11.3*  12.1  PLT  92*  63*  NA  134*  132*  132*  BUN  42*  46*  47*  CREATININE  3.86*  4.09*  4.17*     Time In: 0800 Time Out: 0855  Time Total: 55 min  Greater than 50%  of this time was spent counseling and coordinating care related to the above assessment and plan.  Signed by: Loistine Chance, MD [place], MD  09/22/2014, 9:13 AM  Please contact Palliative Medicine Team phone at [phone] for questions and concerns.

## 2014-09-22 NOTE — Progress Notes (Signed)
Patient off pressors overnight. Off Antibiotics  per ID. Off Amiodarone drip, po now. Palliative, PT/OT, Social work consulted.  CCM following, much appreciated.   Kerrin Mo, MD  Family Medicine.

## 2014-09-22 NOTE — Progress Notes (Signed)
PULMONARY / CRITICAL CARE MEDICINE   Name: Sara Russell MRN: [id number] DOB: 06-Sep-1930    ADMISSION DATE:  09/15/2014 CONSULTATION DATE:  09/16/14  REFERRING MD :  Family Medicine Service  CHIEF COMPLAINT:  Hypotension  INITIAL PRESENTATION:  Admitted 8/4 with 1 day history of shortness of breath on exertion. Known history of atrial fibrillation on systemic and coagulation with Eliquis. Also has a known history of PE/DVT in 2009. Denies any infectious symptoms or complaints other than dyspnea.  STUDIES:  TTE (8/4) -  EF 45-50%, septal hypokinesis, severely dilated LA, mod/severe TR, PA peak pressure =36mHg Portable CXR (8/5) - No effusion or opacity. Hiatal hernia noted.  SIGNIFICANT EVENTS: 8/4 - admit 8/5 - transfer to icu w/ respiratory failure & a fib w/ rvr 8/6 hypothermic with rigors 8/7 CT ab/pel > heterogenous masslike abnormality along posterior liver, large, unclear if mass or abscess, smsll bilat effusions, distended gallbladder, chronic adnexal cyst  SUBJECTIVE: Remains on neo drip, more interactive in a chair this AM.  VITAL SIGNS: Temp:  [97.3 F (36.3 C)-97.9 F (36.6 C)] 97.6 F (36.4 C) (08/10 1930) Pulse Rate:  [70-140] 98 (08/11 0500) Resp:  [0-27] 0 (08/11 0500) BP: (85-152)/(43-113) 106/59 mmHg (08/11 0500) SpO2:  [90 %-100 %] 96 % (08/11 0500) HEMODYNAMICS:   VENTILATOR SETTINGS:   INTAKE / OUTPUT:  Intake/Output Summary (Last 24 hours) at 09/22/14 0633 Last data filed at 09/22/14 0600  Gross per 24 hour  Intake 2083.38 ml  Output    975 ml  Net 1108.38 ml   PHYSICAL EXAMINATION: General:  Awake. Alert. No acute distress.  HENT: NCAT, EOMi PULM: diminished bases, good air movement, clear CV: Irreg irreg, no mgr GI: BS+, soft, nontender all over MSK: normal bulk, tone Neuro: A&Ox4, maew  LABS:  CBC  Recent Labs Lab 09/20/14 0351 09/21/14 0246 09/22/14 0320  WBC 20.9* 14.0* 10.0  HGB 11.6* 11.3* 12.1  HCT 32.2* 33.0* 35.1*  PLT  113* 92* 63*   Coag's No results for input(s): APTT, INR in the last 168 hours. BMET  Recent Labs Lab 09/20/14 0351 09/21/14 0246 09/22/14 0320  NA 132* 134* 132*  132*  K 4.5 4.2 4.1  3.8  CL 104 107 101  101  CO2 14* 13* 15*  16*  BUN 40* 42* 46*  47*  CREATININE 3.37* 3.86* 4.09*  4.17*  GLUCOSE 98 83 73  76   Electrolytes  Recent Labs Lab 09/17/14 0252  09/20/14 0351 09/21/14 0246 09/22/14 0320  CALCIUM 7.4*  < > 7.4* 7.3* 7.6*  7.6*  MG 2.5*  --  2.0  --  1.8  PHOS 1.5*  --  3.9  --  5.2*  5.2*  < > = values in this interval not displayed. Sepsis Markers  Recent Labs Lab 09/16/14 1432 09/16/14 1446 09/16/14 2016 09/17/14 0252 09/17/14 1618 09/18/14 0325  LATICACIDVEN 1.9  --  4.2*  --  3.6*  --   PROCALCITON  --  39.21  --  63.54  --  84.81   ABG  Recent Labs Lab 09/16/14 1100 09/17/14 1842  PHART 7.471* 7.381  PCO2ART 17.6* 22.5*  PO2ART 404* 202.0*   Liver Enzymes  Recent Labs Lab 09/15/14 1100 09/17/14 0252 09/19/14 0554 09/22/14 0320  AST 40  --  29  --   ALT 21  --  19  --   ALKPHOS 173*  --  174*  --   BILITOT 0.9  --  0.7  --  ALBUMIN 2.5* 1.8* 1.8* 1.6*   Cardiac Enzymes  Recent Labs Lab 09/16/14 1104 09/16/14 1450 09/17/14 0035  TROPONINI 0.24* 0.22* 0.17*   Glucose No results for input(s): GLUCAP in the last 168 hours.  Imaging No results found.   ASSESSMENT / PLAN:  PULMONARY A:  Acute hypoxic respiratory failure > resolved At risk for volume overload P:   Monitor respiratory status with IVF resuscitation Out of bed  IS per RT protocol Titrate O2 for sat of 88-92%. Diureses as below.  CARDIOVASCULAR A:  A. fib with RVR > improved rate control Elevated troponin - demand ischemia  Shock - presumed septic as EF normal H/O HTN  H/O HLD P:  B blocker PRN for rate control. Neo as needed for MAP > 65, attempt to titrate down. Bicarb drip decrease to 50 Continue Lipitor Patient wishes for  DNR status.  RENAL A:   Acute oliguric renal failure - now worsening, oliguric; presumed from sepsis Hyponatremia P:   Continue bicarb drip U/A now, urine Cr/Na Renal ultrasound noted Patient is refusing dialysis Bladder scan Renal consult D/C lasix drip. Lasix 40 mg IV q6 x3 doses  GASTROINTESTINAL A:   Hx ulcerative colitis  Ct abd/pelvis with likely infectious mass communicating with the SVC, see ID section. P:   Protonix PO daily. Hemoccult stools.  Renal diet.  HEMATOLOGIC A:   Leukocytosis - resolved Mild Anemia - no signs of active bleeding H/O PE/DVT - 2009 P:  Monitor hemoglobin daily with CBC Holding Eliquis for now.  INFECTIOUS A:   Possible UTI - with rigors concern for abd abscess/ pyelo Ct abd/pelvis with likely infectious mass communicating with the SVC > unclear if neoplasm or infectious but favor infectious  P:   BCx2 8/4>>NTD UC 8/4>>NTD  Abx:  Vancomycin 8/5>>>8/9 Zosyn 8/5>>>8/7 Aztreonam 8/7>>>8/10 Meropenem 8/10>>>8/11  MRI noted, solid mass. D/C meropenem.  ENDOCRINE A:   No acute issues  P:   Monitor BG on daily labs  NEUROLOGIC A:   No acute issues P:   Monitor mental status   FAMILY  - Updates: Patient updated bedside.  - Inter-disciplinary family meet or Palliative Care meeting due by:  DNR issued  The patient is critically ill with multiple organ systems failure and requires high complexity decision making for assessment and support, frequent evaluation and titration of therapies, application of advanced monitoring technologies and extensive interpretation of multiple databases.   Critical Care Time devoted to patient care services described in this note is  35  Minutes. This time reflects time of care of this signee Dr Jennet Maduro. This critical care time does not reflect procedure time, or teaching time or supervisory time of PA/NP/Med student/Med Resident etc but could involve care discussion time.  Rush Farmer, M.D. [facility] Pulmonary/Critical Care Medicine. Pager: [phone]. After hours pager: [phone].  09/22/2014  6:33 AM

## 2014-09-22 NOTE — Progress Notes (Signed)
Family Medicine Teaching Service Daily Progress Note Intern Pager: [phone]  Patient name: Sara Russell Medical record number: [id number] Date of birth: 30-May-1930 Age: 81 y.o. Gender: female  Primary Care Provider: Zigmund Gottron, MD Consultants: Nephrology, Palliative Care  Code Status: DNR   Pt Overview and Major Events to Date:  - Admitted 8/4 for SOB  and Dizziness  - 8/5 Acute decompensation with Acute Respiratory Distress, Hypotension, Atrial fibrillation with RVR  - 8/5 Transferred to [facility]CM  - 8/5 Continued Hypotension, Diltiazem drip started/stopped - started Amiodarone drip  - 8/5  Critical Care started patient on Phenylephrine drip for blood pressures   - 8/5 ECHO completed 8/5  with EF 45-50% with septal hypokinesis, mild to moderate RV dilation with mildly decreased systolic function, mild to moderate pulmonary hypertension - 8/6 hypothermic with rigors 8/7 CT ab/pel > heterogenous masslike abnormality along posterior liver, large, unclear if mass or abscess, smsll bilat effusions, distended gallbladder, chronic adnexal cyst   Assessment and Plan: Sara Russell is a 81 y.o. female presenting with SOB, fatigue and dizziness. PMH is significant for afib, PE (currently on eliquis), HTN, HLD, diverticulosis, GERD, esophageal stricture s/p dilatation, anemia.   # Chronic A. fib with RVR. 8/5 ECHO EF 45-50% and mild to moderate pulmonary hypertension. 8/5-8/6 troponins trended between 0.25 -0.17, likely due to demand ischemia  - HR 117 ( 114 -138)  - Continue carvedilol 3.125 mg BID  - Continue Amiodarone 200 mg PO - Continue Metoprolol 2.5 mg PRN for HR> 120  - Holding  Eliquis 5 mg  BID   # Acute oliguric renal failure -  On Admission SCr 2.25  - baseline 1. Patient requiring pressors from 8/5- 8/11. On 8/8 patient with oliguria at 200 cc over 24hrs. UA on 8/9 with protein and rare hgb. FENa of 1.48 on 8/9 which indicates either prerenal or ATN.  On 8/11 renal  ultrasound benign.  - SCr 4.31< 4.09 - K+ 3.4,  Phosphorus/Mg 5.2/1.7  - Nephrology following  - Continue daily BMP  - Continue Lasix 160 mg IV BID - D/C Bicarb Drip  - Continue strict I&Os    # Occult Positive Stools.  3x stools. Endoscopy (2012) lymphocytic colitis, colonoscopy (2007) positive for diverticulosis and 1x  rectal polyp biopsied. C.diff negative. Fecal Occult Blood test positive x 2 ( negative in 2011- 2012)  - Hgb 10.8 < 12.1   - Continue to monitor possible ischemic colitis vs erosion of mass into bowel     # Hyponatremia. On admission Na 128. Likely due to Acute renal failure  - Na 132 this AM  - Continue to monitor  - Continue bicarb drip  # Hypotension. BP 95/46. Off pressors at 1 AM on 8/11.  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 Primary differential considerations include pheochromocytoma, sarcoma, or possibly metastasis.Patient does not want biopsy because she would not want surgery or chemotherapy - Consulted Palliative -Patient interested in hospice, continue life maintain treatment  - Consider Urine Catacholamines for pheo   # Resolved Septic Shock. On admission started on ceftriaxone for possible uti( UA + mod LE but only3-6 wbc).  CXR negative for eumonia. Decompensation over 24-36hr, with high fevers, rigors, tachycardia with afib with rvr, and hypotension. Broaden to vanco/piptazo and  transferred to the icu. Patient continued to have hypotension, fevers, rigors, leukocytosis. Ct abd/pelvis for possible colitis with positive finding for a malignancy adjacent to the IVC causing a  transient bacteremia. BCX 8/4 and 8/5 and UCx NGTD   - Antibiotics Vancomycin 8/5>>>8/9, Zosyn 8/5>>>8/7, Aztreonam 8/7>>>8/10, Meropenem 8/10>>>8/11  - ID D/C Meropenem, will observe off antibiotic - Continue Protonix daily    # Acute hypoxic respiratory failure - resolved. Desaturation 8 PM at 83%. RR btw 15- 22. Currently on 2L [place]. Patient  with bibasilar crackles this AM. CXR on 8/10 no acute process.  At risk for volume overload - Monitor respiratory status with IVF resuscitation - Continue to encourage incentive spirometry  - Titrate O2 for sat of 88-92%.  # Mild Normocytic Anemia. Hx of Anemia. Normal range hgb 12.0-14. Patient with long standing anemia on iron at home.  - Hgb/Hct 10.9/32.3  - Continue to monitor   # Hx of PE/DVT: On Eliquis and reports compliance, doubt recurrent clot as cause of current symptoms - Helding home Eliquis  # HLD: Last lipid panel in 07/2013 with LDL 30 - Continue home atorvastatin - Repeat FLP in AM  # FEN/GI: - Heart Diet  - home PPI   Prophylaxis: None  Disposition:Stepdown, Inpatient Rehab   Subjective:  Patient doing well this morning, still has decreased appetite. She states she had some desaturations last night around 8 PM. She was started on 2 Liters [place]. She denies ever having any SOB during episode. Patient denies any chest pain, n/v, or dizziness.   Objective: Temp:  [96.6 F (35.9 C)-97.6 F (36.4 C)] 96.6 F (35.9 C) (08/11 1200) Pulse Rate:  [51-152] 152 (08/11 1400) Resp:  [0-27] 25 (08/11 1400) BP: (85-148)/(43-113) 113/50 mmHg (08/11 1400) SpO2:  [90 %-100 %] 95 % (08/11 1400) Physical Exam: General: NAD, patient laying in bed, smiling  Cardiovascular: irregular rhythm, slightly tachycardiac, radial pulses 2+ Respiratory: Bibasilar crackles, left> right, normal WOB,  2 Liters [place]  In place  Abdomen: BS+, NTND  Extremities: SCDs in place, 2+ DP pulses   Laboratory:  Recent Labs Lab 09/20/14 0351 09/21/14 0246 09/22/14 0320  WBC 20.9* 14.0* 10.0  HGB 11.6* 11.3* 12.1  HCT 32.2* 33.0* 35.1*  PLT 113* 92* 63*    Recent Labs Lab 09/19/14 0554 09/20/14 0351 09/21/14 0246 09/22/14 0320  NA 131* 132* 134* 132*  132*  K 5.6* 4.5 4.2 4.1  3.8  CL 103 104 107 101  101  CO2 15* 14* 13* 15*  16*  BUN 36* 40* 42* 46*  47*  CREATININE 3.00*  3.37* 3.86* 4.09*  4.17*  CALCIUM 7.5* 7.4* 7.3* 7.6*  7.6*  PROT 5.7*  --   --   --   BILITOT 0.7  --   --   --   ALKPHOS 174*  --   --   --   ALT 19  --   --   --   AST 29  --   --   --   GLUCOSE 143* 98 83 73  76    Imaging/Diagnostic Tests:  Ct Abdomen Pelvis Wo Contrast  09/18/2014   CLINICAL DATA:  SepsisHx basal and squamous cell ca., diverticulosis, GERDSurgery's; D&C, tonsillectomy, appendectomy, breast lumpectomy(benign)  EXAM: CT ABDOMEN AND PELVIS WITHOUT CONTRAST  TECHNIQUE: Multidetector CT imaging of the abdomen and pelvis was performed following the standard protocol without IV contrast.  COMPARISON:  Pelvic ultrasound, 02/27/2012.  FINDINGS: Lung bases: Small pleural effusions. Lower lobe opacity most consistent with atelectasis. Component of pneumonia is possible but felt likely. 5 mm nodule right lower lobe near the oblique fissure. No pulmonary edema. Heart mildly enlarged. Moderate  size hiatal hernia.  Liver: 4.5 cm abnormality, which may reflect a mass, which lies along the posterior margin of the liver at the base caudate lobe and is inseparable from the the inferior vena cava. It also abuts the right adrenal gland. Longitudinal extent of this abnormality is approximately 6 cm.  Liver otherwise unremarkable.  Spleen: Unremarkable.  Gallbladder: Distended. No radiopaque stone. No wall thickening or adjacent inflammation. Common bile duct is dilated to approximate 1 cm but shows normal distal tapering.  Adrenal glands. The left adrenal gland thickening consistent with nodular hyperplasia. Right adrenal gland abuts the above described lesion. Is otherwise unremarkable.  Kidneys, ureters, bladder: 3.2 cm low-density lesion in the midpole of the left kidney consistent with cysts. No other renal masses. Bilateral renal cortical thinning. No stones. Bilateral perinephric stranding, likely chronic. Left ureter is dilated. There is mild prominence of the left renal pelvis without calyx  dilation. No ureteral stone. Normal right ureter. Bladder is decompressed by a Foley catheter.  Uterus and adnexa: Uterus is unremarkable. There is a left adnexal water density mass measuring 6.7 x 6.1 x 7.5 cm. This was present on the prior ultrasound and is unchanged in overall size. This could be ovarian origin. It may reflect an inclusion cyst. It stability since the 2014 ultrasound strongly supports a benign etiology.  Lymph nodes:  No enlarged lymph nodes.  Ascites:  None.  Gastrointestinal: No bowel dilation to suggest obstruction. No bowel wall thickening or mesenteric inflammation.  Musculoskeletal: Degenerative changes most evident at L2-L3. Bony structures are demineralized. No osteoblastic or osteolytic lesions.  IMPRESSION: 1. Heterogeneous masslike abnormality along the posterior margin of the liver, centered on the inferior vena cava, from which it is inseparable. It abuts the right adrenal gland. This may have an origin from the posterior margin of the liver or from the inferior vena cava. Adrenal origin is felt unlikely. This could be a neoplastic mass. This would be better evaluated with a contrast-enhanced CT scan or, if the patient can tolerate, MRI with and without contrast. 2. Small bilateral pleural effusions.  Lower lobe atelectasis. 3. Distended gallbladder without convincing acute cholecystitis. 4. No abdominal abscess. 5. Chronic left adnexal cyst measuring 7.5 cm in greatest dimension, which may be ovarian in origin or an inclusion cyst. It is similar in size to the ultrasound dated 02/27/2012. 6. No acute bowel abnormality.   Electronically Signed   By: Lajean Manes M.D.   On: 09/18/2014 12:15   Dg Chest 2 View  09/15/2014   CLINICAL DATA:  Shortness of breath. Pain when breathing for 2 days.  EXAM: CHEST  2 VIEW  COMPARISON:  03/20/2014  FINDINGS: Heart size is mildly enlarged. Moderate hiatal hernia present. There are no focal consolidations. No pleural effusions. No pulmonary  edema. Mild scoliosis.  IMPRESSION: 1. Cardiomegaly without edema. 2. Hiatal hernia.   Electronically Signed   By: Nolon Nations M.D.   On: 09/15/2014 11:31   Mr Liver Wo Contrast  09/21/2014   CLINICAL DATA:  Liver mass on CT  EXAM: MRI ABDOMEN WITHOUT CONTRAST  TECHNIQUE: Multiplanar multisequence MR imaging was performed without the administration of intravenous contrast.  COMPARISON:  CT abdomen pelvis dated 09/18/2014  FINDINGS: Severely motion degraded images.  Lower chest: Small bilateral pleural effusions. Associated bilateral lower lobe opacities, likely atelectasis.  Hepatobiliary: Liver is grossly unremarkable. Specifically, no mass is seen. The CT abnormality is distinct from the right liver.  Layering gallstones versus gallbladder sludge (series 5/ image  23). No associated inflammatory changes.  No intrahepatic ductal dilatation. Common duct is mildly dilated with a suspected 5 mm distal CBD stone (series 5/ image 29).  Pancreas: Pancreatic atrophy. Pancreas is poorly evaluated due to motion degradation.  Spleen: Within normal limits.  Adrenals/Urinary Tract: Thickening of the left adrenal gland without discrete mass (series 5/image 26).  Right adrenal gland is incompletely visualized but mildly thickened (series 5/image 23).  2.0 x 2.5 cm well-circumscribed T2 hyperintense mass abutting the anterior aspect of the right adrenal gland and displacing the IVC anteriorly/laterally (series 5/image 20). It is unclear whether the mass is of primary adrenal origin or possibly originates along the posterior wall of the infrahepatic IVC. The lesion is new from 2011 but is poorly characterized on this motion degraded unenhanced MRI. Primary differential considerations include pheochromocytoma or sarcoma. If this is adrenal in etiology, metastasis would also be in the differential in the setting of a known primary neoplasm.  3.0 cm left lower pole renal cyst (series 11/ image 33). Additional subcentimeter  right lower pole renal cyst (series 11/ image 34). Bilateral renal scarring. No hydronephrosis.  Stomach/Bowel: Stomach is notable for a moderate to large hiatal hernia.  Visualized bowel is poorly evaluated/ grossly unremarkable.  Vascular/Lymphatic: No evidence of abdominal aortic aneurysm.  Infrahepatic IVC is effaced/displaced anteriorly, as described above.  No gross abdominal lymphadenopathy.  Other: No abdominal ascites.  Retroperitoneal standing.  Moderate body wall edema.  Musculoskeletal: Degenerative changes of the visualized thoracolumbar spine.  IMPRESSION: Severely motion degraded images. Evaluation is also limited due to lack of intravenous contrast administration.  2.5 cm well-circumscribed T2 hyperintense mass located between the intrahepatic IVC and right adrenal gland, as described above, exact organ of origin unclear. Primary differential considerations include pheochromocytoma, sarcoma, or possibly metastasis.  Liver is grossly unremarkable.  Additional ancillary findings as above.   Electronically Signed   By: Julian Hy M.D.   On: 09/21/2014 08:20   US Renal  09/22/2014   CLINICAL DATA:  Acute renal failure  EXAM: RENAL / URINARY TRACT ULTRASOUND COMPLETE  COMPARISON:  09/18/2014  FINDINGS: Right Kidney:  Length: 10.8 cm. Echogenicity within normal limits. No mass or hydronephrosis visualized.  Left Kidney:  Length: 11.6 cm. A 2.9 cm cyst is noted in the upper pole similar to that seen on prior CT examination.  Bladder:  Decompressed by Foley catheter. Note is made again of a left adnexal cyst.  IMPRESSION: Left renal cyst.  No other focal renal abnormality is noted.   Electronically Signed   By: Inez Catalina M.D.   On: 09/22/2014 11:00   Dg Chest Port 1 View  09/21/2014   CLINICAL DATA:  Acute renal insufficiency, pulmonary embolism and infarction, atrial fibrillation, CHF.  EXAM: PORTABLE CHEST - 1 VIEW  COMPARISON:  Portable chest x-ray of September 17, 2014 and PA and lateral  chest x-ray of August 05, 2013  FINDINGS: The lungs are mildly hypoinflated greater on the right than on the left. The cardiac silhouette is enlarged. The pulmonary vascularity is not engorged. A trace of pleural fluid may be present on the right. There is no alveolar infiltrate. There is density in the retrocardiac region which likely reflects a hiatal hernia given the appearance on the previous studies. The observed bony thorax is unremarkable.  IMPRESSION: There is mild hypoinflation greater on the right than on the left. There is no acute cardiopulmonary abnormality.   Electronically Signed   By: David  Martinique M.D.  On: 09/21/2014 07:22   Dg Chest Port 1 View  09/17/2014   CLINICAL DATA:  Respiratory distress.  EXAM: PORTABLE CHEST - 1 VIEW  COMPARISON:  09/16/2014.  FINDINGS: Cardiac silhouette is mildly enlarged. Opacity in the retrocardiac region is likely moderate hiatal hernia. No hilar masses or adenopathy.  No lung consolidation or edema. No pleural effusion or pneumothorax.  Bony thorax is demineralized but grossly intact.  No significant change from the previous day's study.  IMPRESSION: No acute cardiopulmonary disease.   Electronically Signed   By: Lajean Manes M.D.   On: 09/17/2014 19:47   Dg Chest Port 1 View  09/16/2014   CLINICAL DATA:  Shortness of Breath  EXAM: PORTABLE CHEST - 1 VIEW  COMPARISON:  September 15, 2014  FINDINGS: There is no edema or consolidation. Heart is upper normal in size with pulmonary vascularity within normal limits. Moderate hiatal hernia is present. There is atherosclerotic change in the aorta. No adenopathy. No bone lesions.  IMPRESSION: No edema or consolidation. Moderate hiatal hernia. Atherosclerotic calcification in aorta.   Electronically Signed   By: Lowella Grip III M.D.   On: 09/16/2014 11:20    Devinn Voshell Cletis Media, MD 09/22/2014, 3:30 PM PGY-1, [place] Intern pager: [phone], text pages welcome

## 2014-09-23 DIAGNOSIS — A419 Sepsis, unspecified organism: Secondary | ICD-10-CM

## 2014-09-23 DIAGNOSIS — R0603 Acute respiratory distress: Secondary | ICD-10-CM | POA: Insufficient documentation

## 2014-09-23 DIAGNOSIS — R0600 Dyspnea, unspecified: Secondary | ICD-10-CM

## 2014-09-23 DIAGNOSIS — R6521 Severe sepsis with septic shock: Secondary | ICD-10-CM

## 2014-09-23 LAB — CULTURE, BLOOD (ROUTINE X 2)
Culture: NO GROWTH
Culture: NO GROWTH

## 2014-09-23 LAB — BASIC METABOLIC PANEL
Anion gap: 14 (ref 5–15)
BUN: 50 mg/dL — ABNORMAL HIGH (ref 6–20)
CO2: 21 mmol/L — ABNORMAL LOW (ref 22–32)
Calcium: 7.5 mg/dL — ABNORMAL LOW (ref 8.9–10.3)
Chloride: 98 mmol/L — ABNORMAL LOW (ref 101–111)
Creatinine, Ser: 4.31 mg/dL — ABNORMAL HIGH (ref 0.44–1.00)
GFR calc Af Amer: 10 mL/min — ABNORMAL LOW (ref >60)
GFR calc non Af Amer: 9 mL/min — ABNORMAL LOW (ref >60)
Glucose, Bld: 97 mg/dL (ref 65–99)
Potassium: 3.4 mmol/L — ABNORMAL LOW (ref 3.5–5.1)
Sodium: 133 mmol/L — ABNORMAL LOW (ref 135–145)

## 2014-09-23 LAB — OCCULT BLOOD X 1 CARD TO LAB, STOOL
Fecal Occult Bld: POSITIVE — AB
Fecal Occult Bld: POSITIVE — AB

## 2014-09-23 LAB — CBC
HCT: 30.8 % — ABNORMAL LOW (ref 36.0–46.0)
Hemoglobin: 10.8 g/dL — ABNORMAL LOW (ref 12.0–15.0)
MCH: 29.9 pg (ref 26.0–34.0)
MCHC: 35.1 g/dL (ref 30.0–36.0)
MCV: 85.3 fL (ref 78.0–100.0)
Platelets: 100 10*3/uL — ABNORMAL LOW (ref 150–400)
RBC: 3.61 MIL/uL — ABNORMAL LOW (ref 3.87–5.11)
RDW: 14.2 % (ref 11.5–15.5)
WBC: 8.6 10*3/uL (ref 4.0–10.5)

## 2014-09-23 LAB — C DIFFICILE QUICK SCREEN W PCR REFLEX
C Diff antigen: NEGATIVE
C Diff interpretation: NEGATIVE
C Diff toxin: NEGATIVE

## 2014-09-23 LAB — PHOSPHORUS: Phosphorus: 5.2 mg/dL — ABNORMAL HIGH (ref 2.5–4.6)

## 2014-09-23 LAB — MAGNESIUM: Magnesium: 1.7 mg/dL (ref 1.7–2.4)

## 2014-09-23 MED ORDER — FUROSEMIDE 10 MG/ML IJ SOLN
160.0000 mg | Freq: Two times a day (BID) | INTRAVENOUS | Status: DC
  Administered 2014-09-23 – 2014-09-24 (×2): 160 mg via INTRAVENOUS
  Filled 2014-09-23 (×3): qty 16

## 2014-09-23 MED ORDER — LOPERAMIDE HCL 1 MG/5ML PO LIQD
2.0000 mg | ORAL | Status: DC | PRN
  Administered 2014-09-23 – 2014-09-24 (×3): 2 mg via ORAL
  Filled 2014-09-23 (×5): qty 10

## 2014-09-23 MED ORDER — POTASSIUM CHLORIDE CRYS ER 20 MEQ PO TBCR
20.0000 meq | EXTENDED_RELEASE_TABLET | Freq: Two times a day (BID) | ORAL | Status: AC
  Administered 2014-09-23 (×2): 20 meq via ORAL
  Filled 2014-09-23: qty 1

## 2014-09-23 MED ORDER — DEXTROSE-NACL 5-0.9 % IV SOLN
INTRAVENOUS | Status: DC
Start: 2014-09-23 — End: 2014-09-24
  Administered 2014-09-23 – 2014-09-24 (×2): via INTRAVENOUS

## 2014-09-23 NOTE — Progress Notes (Signed)
Patient Name: Sara Russell Date of Encounter: 09/23/2014     Active Problems:   HYPERCHOLESTEROLEMIA   HYPERTENSION, BENIGN SYSTEMIC   Pulmonary embolism and infarction   GERD   A-fib   Hyponatremia   AKI (acute kidney injury)   SOB (shortness of breath)   Elevated troponin   Chronic diastolic congestive heart failure   Elevated WBC count   Urinary tract infectious disease   Blood poisoning   Septic shock   Liver mass, left lobe   FUO (fever of unknown origin)   Encounter for palliative care   ARF (acute renal failure)   Sepsis    SUBJECTIVE  Patient is not having any chest pain or increased dyspnea. Remains in atrial fib which is chronic. No longer a candidate for Eliquis because of renal insufficiency. Has thrombocytopenia, slightly improved.  CURRENT MEDS . amiodarone  200 mg Oral Daily  . atorvastatin  10 mg Oral q1800  . carvedilol  3.125 mg Oral BID WC  . feeding supplement (NEPRO CARB STEADY)  237 mL Oral TID BM  . furosemide  160 mg Intravenous BID  . pantoprazole  40 mg Oral Daily  . potassium chloride  20 mEq Oral BID    OBJECTIVE  Filed Vitals:   09/23/14 0200 09/23/14 0358 09/23/14 0400 09/23/14 0733  BP: 122/67  110/43 95/46  Pulse: 138 98  117  Temp:  95.7 F (35.4 C)  97.4 F (36.3 C)  TempSrc:  Oral  Oral  Resp: 15   22  Height:      Weight:      SpO2: 96%   96%    Intake/Output Summary (Last 24 hours) at 09/23/14 0943 Last data filed at 09/23/14 7893  Gross per 24 hour  Intake   2186 ml  Output   1577 ml  Net    609 ml   Filed Weights   09/15/14 1504 09/16/14 0547 09/16/14 1215  Weight: 148 lb 13 oz (67.5 kg) 153 lb 1.6 oz (69.446 kg) 150 lb 2.1 oz (68.1 kg)    PHYSICAL EXAM  General: Pleasant, NAD. Neuro: Alert and oriented X 3. Moves all extremities spontaneously. Psych: Normal affect. HEENT:  Normal  Neck: Supple without bruits or JVD. Lungs:  Resp regular and unlabored, CTA. Heart: Irregularly  irregular. Abdomen: Soft, non-tender, non-distended, BS + x 4.  Extremities: No clubbing, cyanosis or edema. DP/PT/Radials 2+ and equal bilaterally.  Accessory Clinical Findings  CBC  Recent Labs  09/21/14 0246 09/22/14 0320 09/23/14 0228  WBC 14.0* 10.0 8.6  NEUTROABS 13.2*  --   --   HGB 11.3* 12.1 10.8*  HCT 33.0* 35.1* 30.8*  MCV 86.4 84.6 85.3  PLT 92* 63* 810*   Basic Metabolic Panel  Recent Labs  09/22/14 0320 09/23/14 0228  NA 132*  132* 133*  K 4.1  3.8 3.4*  CL 101  101 98*  CO2 15*  16* 21*  GLUCOSE 73  76 97  BUN 46*  47* 50*  CREATININE 4.09*  4.17* 4.31*  CALCIUM 7.6*  7.6* 7.5*  MG 1.8 1.7  PHOS 5.2*  5.2* 5.2*   Liver Function Tests  Recent Labs  09/22/14 0320  ALBUMIN 1.6*   No results for input(s): LIPASE, AMYLASE in the last 72 hours. Cardiac Enzymes No results for input(s): CKTOTAL, CKMB, CKMBINDEX, TROPONINI in the last 72 hours. BNP Invalid input(s): POCBNP D-Dimer No results for input(s): DDIMER in the last 72 hours. Hemoglobin A1C No results for  input(s): HGBA1C in the last 72 hours. Fasting Lipid Panel No results for input(s): CHOL, HDL, LDLCALC, TRIG, CHOLHDL, LDLDIRECT in the last 72 hours. Thyroid Function Tests No results for input(s): TSH, T4TOTAL, T3FREE, THYROIDAB in the last 72 hours.  Invalid input(s): FREET3  TELE  Atrial fibrillation with slightly elevated VR  ECG    Radiology/Studies  Ct Abdomen Pelvis Wo Contrast  09/18/2014   CLINICAL DATA:  SepsisHx basal and squamous cell ca., diverticulosis, GERDSurgery's; D&C, tonsillectomy, appendectomy, breast lumpectomy(benign)  EXAM: CT ABDOMEN AND PELVIS WITHOUT CONTRAST  TECHNIQUE: Multidetector CT imaging of the abdomen and pelvis was performed following the standard protocol without IV contrast.  COMPARISON:  Pelvic ultrasound, 02/27/2012.  FINDINGS: Lung bases: Small pleural effusions. Lower lobe opacity most consistent with atelectasis. Component of  pneumonia is possible but felt likely. 5 mm nodule right lower lobe near the oblique fissure. No pulmonary edema. Heart mildly enlarged. Moderate size hiatal hernia.  Liver: 4.5 cm abnormality, which may reflect a mass, which lies along the posterior margin of the liver at the base caudate lobe and is inseparable from the the inferior vena cava. It also abuts the right adrenal gland. Longitudinal extent of this abnormality is approximately 6 cm.  Liver otherwise unremarkable.  Spleen: Unremarkable.  Gallbladder: Distended. No radiopaque stone. No wall thickening or adjacent inflammation. Common bile duct is dilated to approximate 1 cm but shows normal distal tapering.  Adrenal glands. The left adrenal gland thickening consistent with nodular hyperplasia. Right adrenal gland abuts the above described lesion. Is otherwise unremarkable.  Kidneys, ureters, bladder: 3.2 cm low-density lesion in the midpole of the left kidney consistent with cysts. No other renal masses. Bilateral renal cortical thinning. No stones. Bilateral perinephric stranding, likely chronic. Left ureter is dilated. There is mild prominence of the left renal pelvis without calyx dilation. No ureteral stone. Normal right ureter. Bladder is decompressed by a Foley catheter.  Uterus and adnexa: Uterus is unremarkable. There is a left adnexal water density mass measuring 6.7 x 6.1 x 7.5 cm. This was present on the prior ultrasound and is unchanged in overall size. This could be ovarian origin. It may reflect an inclusion cyst. It stability since the 2014 ultrasound strongly supports a benign etiology.  Lymph nodes:  No enlarged lymph nodes.  Ascites:  None.  Gastrointestinal: No bowel dilation to suggest obstruction. No bowel wall thickening or mesenteric inflammation.  Musculoskeletal: Degenerative changes most evident at L2-L3. Bony structures are demineralized. No osteoblastic or osteolytic lesions.  IMPRESSION: 1. Heterogeneous masslike abnormality  along the posterior margin of the liver, centered on the inferior vena cava, from which it is inseparable. It abuts the right adrenal gland. This may have an origin from the posterior margin of the liver or from the inferior vena cava. Adrenal origin is felt unlikely. This could be a neoplastic mass. This would be better evaluated with a contrast-enhanced CT scan or, if the patient can tolerate, MRI with and without contrast. 2. Small bilateral pleural effusions.  Lower lobe atelectasis. 3. Distended gallbladder without convincing acute cholecystitis. 4. No abdominal abscess. 5. Chronic left adnexal cyst measuring 7.5 cm in greatest dimension, which may be ovarian in origin or an inclusion cyst. It is similar in size to the ultrasound dated 02/27/2012. 6. No acute bowel abnormality.   Electronically Signed   By: Lajean Manes M.D.   On: 09/18/2014 12:15   Dg Chest 2 View  09/15/2014   CLINICAL DATA:  Shortness of breath. Pain  when breathing for 2 days.  EXAM: CHEST  2 VIEW  COMPARISON:  03/20/2014  FINDINGS: Heart size is mildly enlarged. Moderate hiatal hernia present. There are no focal consolidations. No pleural effusions. No pulmonary edema. Mild scoliosis.  IMPRESSION: 1. Cardiomegaly without edema. 2. Hiatal hernia.   Electronically Signed   By: Nolon Nations M.D.   On: 09/15/2014 11:31   Mr Liver Wo Contrast  09/21/2014   CLINICAL DATA:  Liver mass on CT  EXAM: MRI ABDOMEN WITHOUT CONTRAST  TECHNIQUE: Multiplanar multisequence MR imaging was performed without the administration of intravenous contrast.  COMPARISON:  CT abdomen pelvis dated 09/18/2014  FINDINGS: Severely motion degraded images.  Lower chest: Small bilateral pleural effusions. Associated bilateral lower lobe opacities, likely atelectasis.  Hepatobiliary: Liver is grossly unremarkable. Specifically, no mass is seen. The CT abnormality is distinct from the right liver.  Layering gallstones versus gallbladder sludge (series 5/ image 23).  No associated inflammatory changes.  No intrahepatic ductal dilatation. Common duct is mildly dilated with a suspected 5 mm distal CBD stone (series 5/ image 29).  Pancreas: Pancreatic atrophy. Pancreas is poorly evaluated due to motion degradation.  Spleen: Within normal limits.  Adrenals/Urinary Tract: Thickening of the left adrenal gland without discrete mass (series 5/image 26).  Right adrenal gland is incompletely visualized but mildly thickened (series 5/image 23).  2.0 x 2.5 cm well-circumscribed T2 hyperintense mass abutting the anterior aspect of the right adrenal gland and displacing the IVC anteriorly/laterally (series 5/image 20). It is unclear whether the mass is of primary adrenal origin or possibly originates along the posterior wall of the infrahepatic IVC. The lesion is new from 2011 but is poorly characterized on this motion degraded unenhanced MRI. Primary differential considerations include pheochromocytoma or sarcoma. If this is adrenal in etiology, metastasis would also be in the differential in the setting of a known primary neoplasm.  3.0 cm left lower pole renal cyst (series 11/ image 33). Additional subcentimeter right lower pole renal cyst (series 11/ image 34). Bilateral renal scarring. No hydronephrosis.  Stomach/Bowel: Stomach is notable for a moderate to large hiatal hernia.  Visualized bowel is poorly evaluated/ grossly unremarkable.  Vascular/Lymphatic: No evidence of abdominal aortic aneurysm.  Infrahepatic IVC is effaced/displaced anteriorly, as described above.  No gross abdominal lymphadenopathy.  Other: No abdominal ascites.  Retroperitoneal standing.  Moderate body wall edema.  Musculoskeletal: Degenerative changes of the visualized thoracolumbar spine.  IMPRESSION: Severely motion degraded images. Evaluation is also limited due to lack of intravenous contrast administration.  2.5 cm well-circumscribed T2 hyperintense mass located between the intrahepatic IVC and right adrenal  gland, as described above, exact organ of origin unclear. Primary differential considerations include pheochromocytoma, sarcoma, or possibly metastasis.  Liver is grossly unremarkable.  Additional ancillary findings as above.   Electronically Signed   By: Julian Hy M.D.   On: 09/21/2014 08:20   US Renal  09/22/2014   CLINICAL DATA:  Acute renal failure  EXAM: RENAL / URINARY TRACT ULTRASOUND COMPLETE  COMPARISON:  09/18/2014  FINDINGS: Right Kidney:  Length: 10.8 cm. Echogenicity within normal limits. No mass or hydronephrosis visualized.  Left Kidney:  Length: 11.6 cm. A 2.9 cm cyst is noted in the upper pole similar to that seen on prior CT examination.  Bladder:  Decompressed by Foley catheter. Note is made again of a left adnexal cyst.  IMPRESSION: Left renal cyst.  No other focal renal abnormality is noted.   Electronically Signed   By: Inez Catalina  M.D.   On: 09/22/2014 11:00   Dg Chest Port 1 View  09/21/2014   CLINICAL DATA:  Acute renal insufficiency, pulmonary embolism and infarction, atrial fibrillation, CHF.  EXAM: PORTABLE CHEST - 1 VIEW  COMPARISON:  Portable chest x-ray of September 17, 2014 and PA and lateral chest x-ray of August 05, 2013  FINDINGS: The lungs are mildly hypoinflated greater on the right than on the left. The cardiac silhouette is enlarged. The pulmonary vascularity is not engorged. A trace of pleural fluid may be present on the right. There is no alveolar infiltrate. There is density in the retrocardiac region which likely reflects a hiatal hernia given the appearance on the previous studies. The observed bony thorax is unremarkable.  IMPRESSION: There is mild hypoinflation greater on the right than on the left. There is no acute cardiopulmonary abnormality.   Electronically Signed   By: David  Martinique M.D.   On: 09/21/2014 07:22   Dg Chest Port 1 View  09/17/2014   CLINICAL DATA:  Respiratory distress.  EXAM: PORTABLE CHEST - 1 VIEW  COMPARISON:  09/16/2014.  FINDINGS:  Cardiac silhouette is mildly enlarged. Opacity in the retrocardiac region is likely moderate hiatal hernia. No hilar masses or adenopathy.  No lung consolidation or edema. No pleural effusion or pneumothorax.  Bony thorax is demineralized but grossly intact.  No significant change from the previous day's study.  IMPRESSION: No acute cardiopulmonary disease.   Electronically Signed   By: Lajean Manes M.D.   On: 09/17/2014 19:47   Dg Chest Port 1 View  09/16/2014   CLINICAL DATA:  Shortness of Breath  EXAM: PORTABLE CHEST - 1 VIEW  COMPARISON:  September 15, 2014  FINDINGS: There is no edema or consolidation. Heart is upper normal in size with pulmonary vascularity within normal limits. Moderate hiatal hernia is present. There is atherosclerotic change in the aorta. No adenopathy. No bone lesions.  IMPRESSION: No edema or consolidation. Moderate hiatal hernia. Atherosclerotic calcification in aorta.   Electronically Signed   By: Lowella Grip III M.D.   On: 09/16/2014 11:20    ASSESSMENT AND PLAN 1. Chronic atrial fibrillation with rapid ventricular response 2. Hypotension, improved. She is off pressors. 3. Renal insufficiency 4. Perihepatic mass, likely cancer 5. Anemia 6. Thrombocytopenia.  Plan continue rate control with low dose coreg and low dose amiodarone. BP still soft so cannot increase coreg yet.  Signed, Warren Danes MD

## 2014-09-23 NOTE — Progress Notes (Signed)
Rehab admissions - Evaluated for possible admission.  I met with patient and her son, Ronalee Belts, at the bedside.  I gave them inpatient rehab booklets and explained about rehab.  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  She is not medically ready for rehab today.  I will follow progress over weekend and check back on Monday for potential inpatient rehab admission.  Call me for questions.  #[phone]

## 2014-09-23 NOTE — Progress Notes (Signed)
S: No new CO.  Says she had 3 nepros yest O:BP 95/46 mmHg  Pulse 117  Temp(Src) 97.4 F (36.3 C) (Oral)  Resp 22  Ht 5' 1"  (1.549 m)  Wt 68.1 kg (150 lb 2.1 oz)  BMI 28.38 kg/m2  SpO2 96%  Intake/Output Summary (Last 24 hours) at 09/23/14 0998 Last data filed at 09/23/14 0700  Gross per 24 hour  Intake   1949 ml  Output   1577 ml  Net    372 ml   Weight change:  PJA:SNKNL and alert ZJQ:BHALP, irreg, tachy Resp:Few faint basilar crackles Abd:+ BS NTND Ext:tr edema NEURO:CNI Ox3 no asterixis   . amiodarone  200 mg Oral Daily  . atorvastatin  10 mg Oral q1800  . carvedilol  3.125 mg Oral BID WC  . feeding supplement (NEPRO CARB STEADY)  237 mL Oral TID BM  . furosemide  160 mg Intravenous 3 times per day  . pantoprazole  40 mg Oral Daily   US Renal  09/22/2014   CLINICAL DATA:  Acute renal failure  EXAM: RENAL / URINARY TRACT ULTRASOUND COMPLETE  COMPARISON:  09/18/2014  FINDINGS: Right Kidney:  Length: 10.8 cm. Echogenicity within normal limits. No mass or hydronephrosis visualized.  Left Kidney:  Length: 11.6 cm. A 2.9 cm cyst is noted in the upper pole similar to that seen on prior CT examination.  Bladder:  Decompressed by Foley catheter. Note is made again of a left adnexal cyst.  IMPRESSION: Left renal cyst.  No other focal renal abnormality is noted.   Electronically Signed   By: Inez Catalina M.D.   On: 09/22/2014 11:00   BMET    Component Value Date/Time   NA 133* 09/23/2014 0228   K 3.4* 09/23/2014 0228   CL 98* 09/23/2014 0228   CO2 21* 09/23/2014 0228   GLUCOSE 97 09/23/2014 0228   BUN 50* 09/23/2014 0228   CREATININE 4.31* 09/23/2014 0228   CREATININE 0.83 09/10/2013 1026   CALCIUM 7.5* 09/23/2014 0228   GFRNONAA 9* 09/23/2014 0228   GFRNONAA 54* 05/18/2012 0839   GFRAA 10* 09/23/2014 0228   GFRAA 63 05/18/2012 0839   CBC    Component Value Date/Time   WBC 8.6 09/23/2014 0228   RBC 3.61* 09/23/2014 0228   RBC 4.03 07/06/2009 2249   HGB 10.8*  09/23/2014 0228   HGB 14.2 08/28/2010 1041   HCT 30.8* 09/23/2014 0228   PLT 100* 09/23/2014 0228   MCV 85.3 09/23/2014 0228   MCH 29.9 09/23/2014 0228   MCHC 35.1 09/23/2014 0228   RDW 14.2 09/23/2014 0228   LYMPHSABS 0.3* 09/21/2014 0246   MONOABS 0.5 09/21/2014 0246   EOSABS 0.0 09/21/2014 0246   BASOSABS 0.0 09/21/2014 0246     Assessment:  1. ARF sec ischemic ATN in face of ACE, UO improving.  Rate of rise of Scr lessening 2. Mass between Rt adrenal gland and IVC 3. Chronic A fib 4. Met acidosis on bicarb gtt 5. Mild hypokalemia  Plan: 1. DC IV bicarb 2. Decrease lasix to BID 3. Daily labs  4. Replace K Sara Russell

## 2014-09-23 NOTE — Progress Notes (Signed)
PULMONARY / CRITICAL CARE MEDICINE   Name: Sara Russell MRN: [id number] DOB: 1931/01/08    ADMISSION DATE:  09/15/2014 CONSULTATION DATE:  09/16/14  REFERRING MD :  Family Medicine Service  CHIEF COMPLAINT:  Hypotension  INITIAL PRESENTATION:  Admitted 8/4 with 1 day history of shortness of breath on exertion. Known history of atrial fibrillation on systemic and coagulation with Eliquis. Also has a known history of PE/DVT in 2009. Denies any infectious symptoms or complaints other than dyspnea.  STUDIES:  TTE (8/4) -  EF 45-50%, septal hypokinesis, severely dilated LA, mod/severe TR, PA peak pressure =17mHg Portable CXR (8/5) - No effusion or opacity. Hiatal hernia noted.  SIGNIFICANT EVENTS: 8/4 - admit 8/5 - transfer to icu w/ respiratory failure & a fib w/ rvr 8/6 hypothermic with rigors 8/7 CT ab/pel > heterogenous masslike abnormality along posterior liver, large, unclear if mass or abscess, smsll bilat effusions, distended gallbladder, chronic adnexal cyst  SUBJECTIVE: Off neo.  VITAL SIGNS: Temp:  [95.7 F (35.4 C)-98 F (36.7 C)] 97.4 F (36.3 C) (08/12 0733) Pulse Rate:  [48-204] 48 (08/12 1000) Resp:  [7-28] 7 (08/12 1000) BP: (93-126)/(40-85) 114/69 mmHg (08/12 1000) SpO2:  [83 %-96 %] 95 % (08/12 1000) HEMODYNAMICS:   VENTILATOR SETTINGS:   INTAKE / OUTPUT:  Intake/Output Summary (Last 24 hours) at 09/23/14 1105 Last data filed at 09/23/14 1000  Gross per 24 hour  Intake   1899 ml  Output   1452 ml  Net    447 ml   PHYSICAL EXAMINATION: General:  Awake. Alert. No acute distress.  Head: [place]/AT EENT: PERRL, EOMi and MMM PULM: diminished bases, good air movement, clear CV: Irreg irreg, no mgr GI: BS+, soft, nontender all over MSK: normal bulk, tone Neuro: A&Ox4, maew  LABS:  CBC  Recent Labs Lab 09/21/14 0246 09/22/14 0320 09/23/14 0228  WBC 14.0* 10.0 8.6  HGB 11.3* 12.1 10.8*  HCT 33.0* 35.1* 30.8*  PLT 92* 63* 100*   Coag's No  results for input(s): APTT, INR in the last 168 hours. BMET  Recent Labs Lab 09/21/14 0246 09/22/14 0320 09/23/14 0228  NA 134* 132*  132* 133*  K 4.2 4.1  3.8 3.4*  CL 107 101  101 98*  CO2 13* 15*  16* 21*  BUN 42* 46*  47* 50*  CREATININE 3.86* 4.09*  4.17* 4.31*  GLUCOSE 83 73  76 97   Electrolytes  Recent Labs Lab 09/20/14 0351 09/21/14 0246 09/22/14 0320 09/23/14 0228  CALCIUM 7.4* 7.3* 7.6*  7.6* 7.5*  MG 2.0  --  1.8 1.7  PHOS 3.9  --  5.2*  5.2* 5.2*   Sepsis Markers  Recent Labs Lab 09/16/14 1432 09/16/14 1446 09/16/14 2016 09/17/14 0252 09/17/14 1618 09/18/14 0325  LATICACIDVEN 1.9  --  4.2*  --  3.6*  --   PROCALCITON  --  39.21  --  63.54  --  84.81   ABG  Recent Labs Lab 09/17/14 1842  PHART 7.381  PCO2ART 22.5*  PO2ART 202.0*   Liver Enzymes  Recent Labs Lab 09/17/14 0252 09/19/14 0554 09/22/14 0320  AST  --  29  --   ALT  --  19  --   ALKPHOS  --  174*  --   BILITOT  --  0.7  --   ALBUMIN 1.8* 1.8* 1.6*   Cardiac Enzymes  Recent Labs Lab 09/16/14 1450 09/17/14 0035  TROPONINI 0.22* 0.17*   Glucose No results for input(s): GLUCAP  in the last 168 hours.  Imaging I reviewed CXR myself, no evidence of pulmonary edema.   ASSESSMENT / PLAN:  PULMONARY A:  Acute hypoxic respiratory failure > resolved At risk for volume overload P:   Monitor respiratory status with IVF resuscitation Out of bed  IS per RT protocol Titrate O2 for sat of 88-92%. Diureses as below.  CARDIOVASCULAR A:  A. fib with RVR > improved rate control Elevated troponin - demand ischemia  Shock - presumed septic as EF normal H/O HTN  H/O HLD P:  B blocker PRN for rate control. D/C Neo D/C Bicarb Continue Lipitor Patient wishes for DNR status.  RENAL A:   Acute oliguric renal failure - now worsening, oliguric; presumed from sepsis Hyponatremia P:   D/C bicarb drip U/A now, urine Cr/Na Renal ultrasound noted Patient is  refusing dialysis Bladder scan noted Renal consult appreciated. D/C lasix drip. Lasix 40 mg BID per renal  GASTROINTESTINAL A:   Hx ulcerative colitis  Ct abd/pelvis with likely infectious mass communicating with the SVC, see ID section. P:   Protonix PO daily. Hemoccult stools.  Renal diet.  HEMATOLOGIC A:   Leukocytosis - resolved Mild Anemia - no signs of active bleeding H/O PE/DVT - 2009 P:  Monitor hemoglobin daily with CBC Holding Eliquis for now.  INFECTIOUS A:   Possible UTI - with rigors concern for abd abscess/ pyelo Ct abd/pelvis with likely infectious mass communicating with the SVC > unclear if neoplasm or infectious but favor infectious  P:   BCx2 8/4>>NTD UC 8/4>>NTD  Abx:  Vancomycin 8/5>>>8/9 Zosyn 8/5>>>8/7 Aztreonam 8/7>>>8/10 Meropenem 8/10>>>8/11  MRI noted, solid mass. D/C meropenem.  ENDOCRINE A:   No acute issues  P:   Monitor BG on daily labs  NEUROLOGIC A:   No acute issues P:   Monitor mental status   FAMILY  - Updates: Patient updated bedside.  - Inter-disciplinary family meet or Palliative Care meeting due by:  DNR issued  Discussed with FPTS, will transfer care back to [place] and PCCM will sign off.  Rush Farmer, M.D. [facility] Pulmonary/Critical Care Medicine. Pager: [phone]. After hours pager: [phone].  09/23/2014  11:05 AM

## 2014-09-23 NOTE — Progress Notes (Signed)
Physical Therapy Treatment Patient Details Name: Sara Russell MRN: [id number] DOB: 03/06/1930 Today's Date: 09/23/2014    History of Present Illness Admitted 8/4 with 1 day history of shortness of breath on exertion. Known history of atrial fibrillation on systemic and coagulation with Eliquis. Also has a known history of PE/DVT in 2009. Denies any infectious symptoms or complaints other than dyspnea    PT Comments    Patient in bed, agreeable to participate in PT today. Patient was able to participate in bed mobility and transfer as described below. Patient will benefit from continued PT to address transfer deficits as well as ambulation to return her to her independent lifestyle.   Follow Up Recommendations  CIR;Supervision - Intermittent     Equipment Recommendations   (TBA)    Recommendations for Other Services Rehab consult     Precautions / Restrictions Precautions Precautions: Fall Restrictions Weight Bearing Restrictions: No    Mobility  Bed Mobility Overal bed mobility: Needs Assistance Bed Mobility: Supine to Sit;Sit to Supine;Rolling Rolling: Supervision   Supine to sit: Mod assist;HOB elevated Sit to supine: Supervision (For lines/leads management.)   General bed mobility comments: Patient able to perform sit to supine with no physical assistance. Required mod A x1 for supine to sit.  Transfers Overall transfer level: Needs assistance Equipment used: Rolling walker (2 wheeled);1 person hand held assist Transfers: Sit to/from [facility] Sit to Stand: Max assist;Mod assist Stand pivot transfers: Mod assist       General transfer comment: First sit to stand transfer required max A x 1, but patient progressed to needing less assistance throughout session. Patient able to take shuffle steps with mod A x1 for trunk support.to get back to bed.  Ambulation/Gait             General Gait Details: Patient agreeable to try ambulation next  session.   Stairs            Wheelchair Mobility    Modified Rankin (Stroke Patients Only)       Balance Overall balance assessment: Needs assistance Sitting-balance support: Feet supported Sitting balance-Leahy Scale: Good     Standing balance support: Bilateral upper extremity supported Standing balance-Leahy Scale: Poor Standing balance comment: Patient required mod A x1 for standing balance. Less assistance needed in combination with RW.                    Cognition Arousal/Alertness: Awake/alert Behavior During Therapy: WFL for tasks assessed/performed Overall Cognitive Status: Within Functional Limits for tasks assessed                      Exercises      General Comments        Pertinent Vitals/Pain Pain Assessment: No/denies pain  VSS with HR fluctuating at times.  Leads were possibly not in place well therefore adjusted.  Desat to 84- 88% on 3L with first transfer however subsequent transfer 90% and above.   Home Living                      Prior Function            PT Goals (current goals can now be found in the care plan section) Acute Rehab PT Goals Patient Stated Goal: Go home PT Goal Formulation: With patient Time For Goal Achievement: 10/05/14 Potential to Achieve Goals: Good Progress towards PT goals: Progressing toward goals    Frequency  Min  3X/week    PT Plan Current plan remains appropriate    Co-evaluation             End of Session Equipment Utilized During Treatment: Gait belt Activity Tolerance: Patient limited by fatigue Patient left: in bed;with call bell/phone within reach     Time: 1453-1526 PT Time Calculation (min) (ACUTE ONLY): 33 min  Charges:  $Therapeutic Activity: 23-37 mins                    G CodesRoanna Epley, SPT [phone]  09/23/2014, 4:06 PM  I have read, reviewed and agree with student's note.   [place] [phone] (pager)

## 2014-09-23 NOTE — Care Management Important Message (Signed)
Important Message  Patient Details  Name: Sara Russell MRN: [id number] Date of Birth: 1930/10/04   Medicare Important Message Given:  Yes-third notification given    Pricilla Handler 09/23/2014, 12:03 PM

## 2014-09-24 LAB — CBC
HCT: 34.7 % — ABNORMAL LOW (ref 36.0–46.0)
Hemoglobin: 12.2 g/dL (ref 12.0–15.0)
MCH: 29.7 pg (ref 26.0–34.0)
MCHC: 35.2 g/dL (ref 30.0–36.0)
MCV: 84.4 fL (ref 78.0–100.0)
Platelets: DECREASED 10*3/uL (ref 150–400)
RBC: 4.11 MIL/uL (ref 3.87–5.11)
RDW: 14.1 % (ref 11.5–15.5)
WBC: 7.8 10*3/uL (ref 4.0–10.5)

## 2014-09-24 LAB — BASIC METABOLIC PANEL
Anion gap: 15 (ref 5–15)
BUN: 55 mg/dL — ABNORMAL HIGH (ref 6–20)
CO2: 21 mmol/L — ABNORMAL LOW (ref 22–32)
Calcium: 7.7 mg/dL — ABNORMAL LOW (ref 8.9–10.3)
Chloride: 97 mmol/L — ABNORMAL LOW (ref 101–111)
Creatinine, Ser: 4.46 mg/dL — ABNORMAL HIGH (ref 0.44–1.00)
GFR calc Af Amer: 10 mL/min — ABNORMAL LOW (ref >60)
GFR calc non Af Amer: 8 mL/min — ABNORMAL LOW (ref >60)
Glucose, Bld: 96 mg/dL (ref 65–99)
Potassium: 3.3 mmol/L — ABNORMAL LOW (ref 3.5–5.1)
Sodium: 133 mmol/L — ABNORMAL LOW (ref 135–145)

## 2014-09-24 LAB — OCCULT BLOOD X 1 CARD TO LAB, STOOL: Fecal Occult Bld: NEGATIVE

## 2014-09-24 MED ORDER — FUROSEMIDE 10 MG/ML IJ SOLN
80.0000 mg | Freq: Two times a day (BID) | INTRAMUSCULAR | Status: DC
  Administered 2014-09-24 – 2014-09-26 (×5): 80 mg via INTRAVENOUS
  Filled 2014-09-24 (×7): qty 8

## 2014-09-24 MED ORDER — POTASSIUM CHLORIDE CRYS ER 20 MEQ PO TBCR
20.0000 meq | EXTENDED_RELEASE_TABLET | Freq: Two times a day (BID) | ORAL | Status: AC
  Administered 2014-09-24 (×2): 20 meq via ORAL
  Filled 2014-09-24: qty 1

## 2014-09-24 NOTE — Progress Notes (Signed)
    SUBJECTIVE:  Denies any acute SOB or pain.  She is weak   PHYSICAL EXAM Filed Vitals:   09/24/14 0350 09/24/14 0400 09/24/14 0700 09/24/14 0800  BP:  104/59  121/54  Pulse:  100 148 157  Temp: 97.9 F (36.6 C)   97.9 F (36.6 C)  TempSrc: Oral   Oral  Resp:  21 24 21   Height:      Weight:      SpO2:  96% 95% 93%   General:  No distress Lungs:  Decreased breath sounds with coarse crackles at the bases Heart:  Irregular Abdomen:  Positive bowel sounds, no rebound no guarding Extremities:  No edema   LABS:  Results for orders placed or performed during the hospital encounter of 09/15/14 (from the past 24 hour(s))  CBC     Status: None (Preliminary result)   Collection Time: 09/24/14  2:35 AM  Result Value Ref Range   WBC PENDING 4.0 - 10.5 K/uL   RBC PENDING 3.87 - 5.11 MIL/uL   Hemoglobin PENDING 12.0 - 15.0 g/dL   HCT PENDING 36.0 - 46.0 %   MCV PENDING 78.0 - 100.0 fL   MCH PENDING 26.0 - 34.0 pg   MCHC PENDING 30.0 - 36.0 g/dL   RDW PENDING 11.5 - 15.5 %   Platelets  150 - 400 K/uL    PLATELET CLUMPS NOTED ON SMEAR, COUNT APPEARS DECREASED  Basic metabolic panel     Status: Abnormal   Collection Time: 09/24/14  2:35 AM  Result Value Ref Range   Sodium 133 (L) 135 - 145 mmol/L   Potassium 3.3 (L) 3.5 - 5.1 mmol/L   Chloride 97 (L) 101 - 111 mmol/L   CO2 21 (L) 22 - 32 mmol/L   Glucose, Bld 96 65 - 99 mg/dL   BUN 55 (H) 6 - 20 mg/dL   Creatinine, Ser 4.46 (H) 0.44 - 1.00 mg/dL   Calcium 7.7 (L) 8.9 - 10.3 mg/dL   GFR calc non Af Amer 8 (L) >60 mL/min   GFR calc Af Amer 10 (L) >60 mL/min   Anion gap 15 5 - 15  Occult blood card to lab, stool     Status: None   Collection Time: 09/24/14  9:54 AM  Result Value Ref Range   Fecal Occult Bld NEGATIVE NEGATIVE    Intake/Output Summary (Last 24 hours) at 09/24/14 1140 Last data filed at 09/24/14 1100  Gross per 24 hour  Intake   1693 ml  Output   1975 ml  Net   -282 ml    ASSESSMENT AND PLAN:  ATRIAL  FIB WITH RVR:   Continue amiodarone and current dose Coreg.  Telemetry was reviewed and the rate is OK.    HYPOTENSION:   Improved but still labile and will not allow med titration.   Likely will be able to titrate beta blocker dose if the BP remains stable.    CKD:  Rate of rise of creat is less than previous.  Renal is following.    Jeneen Rinks [facility] 09/24/2014 11:40 AM

## 2014-09-24 NOTE — Progress Notes (Signed)
S: Feels well, no new CO  Had 3 nepros O:BP 104/59 mmHg  Pulse 100  Temp(Src) 97.9 F (36.6 C) (Oral)  Resp 21  Ht 5' 1"  (1.549 m)  Wt 68.1 kg (150 lb 2.1 oz)  BMI 28.38 kg/m2  SpO2 96%  Intake/Output Summary (Last 24 hours) at 09/24/14 0814 Last data filed at 09/24/14 0700  Gross per 24 hour  Intake   1524 ml  Output   2350 ml  Net   -826 ml   Weight change:  AUQ:JFHLK and alert TGY:BWLSL, irreg Resp:Few faint basilar crackles Abd:+ BS NTND Ext:0-tr edema NEURO:CNI Ox3 no asterixis   . amiodarone  200 mg Oral Daily  . atorvastatin  10 mg Oral q1800  . carvedilol  3.125 mg Oral BID WC  . feeding supplement (NEPRO CARB STEADY)  237 mL Oral TID BM  . furosemide  160 mg Intravenous BID  . pantoprazole  40 mg Oral Daily   US Renal  09/22/2014   CLINICAL DATA:  Acute renal failure  EXAM: RENAL / URINARY TRACT ULTRASOUND COMPLETE  COMPARISON:  09/18/2014  FINDINGS: Right Kidney:  Length: 10.8 cm. Echogenicity within normal limits. No mass or hydronephrosis visualized.  Left Kidney:  Length: 11.6 cm. A 2.9 cm cyst is noted in the upper pole similar to that seen on prior CT examination.  Bladder:  Decompressed by Foley catheter. Note is made again of a left adnexal cyst.  IMPRESSION: Left renal cyst.  No other focal renal abnormality is noted.   Electronically Signed   By: Inez Catalina M.D.   On: 09/22/2014 11:00   BMET    Component Value Date/Time   NA 133* 09/24/2014 0235   K 3.3* 09/24/2014 0235   CL 97* 09/24/2014 0235   CO2 21* 09/24/2014 0235   GLUCOSE 96 09/24/2014 0235   BUN 55* 09/24/2014 0235   CREATININE 4.46* 09/24/2014 0235   CREATININE 0.83 09/10/2013 1026   CALCIUM 7.7* 09/24/2014 0235   GFRNONAA 8* 09/24/2014 0235   GFRNONAA 54* 05/18/2012 0839   GFRAA 10* 09/24/2014 0235   GFRAA 63 05/18/2012 0839   CBC    Component Value Date/Time   WBC 8.6 09/23/2014 0228   RBC 3.61* 09/23/2014 0228   RBC 4.03 07/06/2009 2249   HGB 10.8* 09/23/2014 0228   HGB  14.2 08/28/2010 1041   HCT 30.8* 09/23/2014 0228   PLT 100* 09/23/2014 0228   MCV 85.3 09/23/2014 0228   MCH 29.9 09/23/2014 0228   MCHC 35.1 09/23/2014 0228   RDW 14.2 09/23/2014 0228   LYMPHSABS 0.3* 09/21/2014 0246   MONOABS 0.5 09/21/2014 0246   EOSABS 0.0 09/21/2014 0246   BASOSABS 0.0 09/21/2014 0246     Assessment:  1. ARF sec ischemic ATN in face of ACE, UO improving.  Rate of rise of Scr lessening 2. Mass between Rt adrenal gland and IVC 3. Chronic A fib 4. Met acidosis, improved 5. Mild hypokalemia  Plan: 1. Replace K 2. Not sure what 24hr catacholamines mean in someone who had just been on catacholamines for hypotension.  The results should be taken with a grain of salt if high. 3. Cont diuretics, will decrease dose 4. Daily labs   Sara Russell T

## 2014-09-24 NOTE — Progress Notes (Signed)
Family Medicine Teaching Service Daily Progress Note Intern Pager: [phone]  Patient name: Sara Russell Medical record number: [id number] Date of birth: [birth year] Age: 81 y.o. Gender: female  Primary Care Provider: Zigmund Gottron, MD Consultants: Nephrology, Palliative Care  Code Status: DNR   Pt Overview and Major Events to Date:  - Admitted 8/4 for SOB  and Dizziness  - 8/5 Acute decompensation with Acute Respiratory Distress, Hypotension, Atrial fibrillation with RVR  - 8/5 Transferred to CCM  - 8/5 Continued Hypotension, Diltiazem drip started/stopped - started Amiodarone drip  - 8/5  Critical Care started patient on Phenylephrine drip for blood pressures   - 8/5 ECHO completed 8/5  with EF 45-50% with septal hypokinesis, mild to moderate RV dilation with mildly decreased systolic function, mild to moderate pulmonary hypertension - 8/6 hypothermic with rigors 8/7 CT ab/pel > heterogenous masslike abnormality along posterior liver, large, unclear if mass or abscess, smsll bilat effusions, distended gallbladder, chronic adnexal cyst   Assessment and Plan: [name] is a 81 y.o. female presenting with SOB, fatigue and dizziness. PMH is significant for afib, PE (currently on eliquis), HTN, HLD, diverticulosis, GERD, esophageal stricture s/p dilatation, anemia.   # Chronic A. fib with RVR. 8/5 ECHO EF 45-50% and mild to moderate pulmonary hypertension. 8/5-8/6 troponins trended between 0.25 -0.17, likely due to demand ischemia  - transfer to tele floor - Continue carvedilol 3.125 mg BID  - Continue Amiodarone 200 mg PO - Continue Metoprolol 2.5 mg PRN for HR> 120  - Holding  Eliquis 5 mg  BID   # Acute oliguric renal failure -  On Admission SCr 2.25  - baseline 1. Patient requiring pressors from 8/5- 8/11. On 8/8 patient with oliguria at 200 cc over 24hrs. UA on 8/9 with protein and rare hgb. FENa of 1.48 on 8/9 which indicates either prerenal or ATN.  On 8/11 renal  ultrasound benign. Scr continues to trend up 4.09>4.31>4.46 - Nephrology following  - Continue daily BMP  - lasix decreased from 160 BID to 80 BID (suspect some overdiuresis with Cr rise) - Continue strict I&Os    # Occult Positive Stools.  3x stools. Endoscopy (2012) lymphocytic colitis, colonoscopy (2007) positive for diverticulosis and 1x  rectal polyp biopsied. C.diff negative. Fecal Occult Blood test positive x 2 ( negative in 2011- 2012)  - Hgb 10.8 < 12.1   - Continue to monitor possible ischemic colitis vs erosion of mass into bowel     # Hyponatremia. On admission Na 128. Likely due to Acute renal failure/chronic. Stable currently. - Continue to monitor   # Hypokalemia: 3.3 - replete 20 kdur x 2 doses   # Hypotension. BP 95/46. Off pressors at 1 AM on 8/11.  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 Primary differential considerations include pheochromocytoma, sarcoma, or possibly metastasis.Patient does not want biopsy because she would not want surgery or chemotherapy - Consulted Palliative -Patient interested in hospice, continue life maintain treatment  - Urine Catacholamines ordered, however note pt was recently on phenylephrine for pressor support  # Resolved Septic Shock. On admission started on ceftriaxone for possible uti( UA + mod LE but only3-6 wbc).  CXR negative for eumonia. Decompensation over 24-36hr, with high fevers, rigors, tachycardia with afib with rvr, and hypotension. Broaden to vanco/piptazo and  transferred to the icu. Patient continued to have hypotension, fevers, rigors, leukocytosis. Ct abd/pelvis for possible colitis with positive finding for a malignancy adjacent  to the IVC causing a transient bacteremia. BCX 8/4 and 8/5 and UCx NGTD  - Antibiotics Vancomycin 8/5>>>8/9, Zosyn 8/5>>>8/7, Aztreonam 8/7>>>8/10, Meropenem 8/10>>>8/11  - ID D/C Meropenem, will observe off antibiotic - Continue Protonix daily    # Acute  hypoxic respiratory failure - resolved. Desaturation 8 PM at 83%. RR btw 15- 22. Currently on 2L [place]. Patient with bibasilar crackles this AM. CXR on 8/10 no acute process.  At risk for volume overload - Monitor respiratory status with IVF resuscitation - Continue to encourage incentive spirometry  - Titrate O2 for sat of 88-92%.  # Mild Normocytic Anemia. Hx of Anemia. Normal range hgb 12.0-14. Patient with long standing anemia on iron at home.  - pending repeat CBC - Continue to monitor   # Hx of PE/DVT: On Eliquis and reports compliance, doubt recurrent clot as cause of current symptoms - Holding home Eliquis  # HLD: Last lipid panel in 07/2013 with LDL 30 - Continue home atorvastatin  # FEN/GI: - Heart Diet  - home PPI   Prophylaxis: None  Disposition:transfer to tele, anticipate CIR  Subjective:  Feels better today. Does endorse some respiratory symptoms, feels a little congested but incentive spirometer has been helping. Minimal cough, non-productive. No chest pain, no dizziness.  Objective: Temp:  [97.4 F (36.3 C)-97.9 F (36.6 C)] 97.9 F (36.6 C) (08/13 0350) Pulse Rate:  [48-117] 100 (08/13 0400) Resp:  [7-24] 21 (08/13 0400) BP: (93-115)/(45-85) 104/59 mmHg (08/13 0400) SpO2:  [93 %-100 %] 96 % (08/13 0400) Physical Exam: General: NAD, patient laying in bed, smiling  Cardiovascular: irregular rhythm,, radial pulses 2+ Respiratory: overall clear breath sounds, normal effort.  2 Liters [place]  In place  Abdomen: BS+, NTND  Extremities: SCDs in place, 2+ DP pulses. No LE edema.  Laboratory:  Recent Labs Lab 09/21/14 0246 09/22/14 0320 09/23/14 0228  WBC 14.0* 10.0 8.6  HGB 11.3* 12.1 10.8*  HCT 33.0* 35.1* 30.8*  PLT 92* 63* 100*    Recent Labs Lab 09/19/14 0554  09/22/14 0320 09/23/14 0228 09/24/14 0235  NA 131*  < > 132*  132* 133* 133*  K 5.6*  < > 4.1  3.8 3.4* 3.3*  CL 103  < > 101  101 98* 97*  CO2 15*  < > 15*  16* 21* 21*  BUN 36*  < >  46*  47* 50* 55*  CREATININE 3.00*  < > 4.09*  4.17* 4.31* 4.46*  CALCIUM 7.5*  < > 7.6*  7.6* 7.5* 7.7*  PROT 5.7*  --   --   --   --   BILITOT 0.7  --   --   --   --   ALKPHOS 174*  --   --   --   --   ALT 19  --   --   --   --   AST 29  --   --   --   --   GLUCOSE 143*  < > 73  76 97 96  < > = values in this interval not displayed.  Imaging/Diagnostic Tests:  Ct Abdomen Pelvis Wo Contrast  09/18/2014   CLINICAL DATA:  SepsisHx basal and squamous cell ca., diverticulosis, GERDSurgery's; D&C, tonsillectomy, appendectomy, breast lumpectomy(benign)  EXAM: CT ABDOMEN AND PELVIS WITHOUT CONTRAST  TECHNIQUE: Multidetector CT imaging of the abdomen and pelvis was performed following the standard protocol without IV contrast.  COMPARISON:  Pelvic ultrasound, 02/27/2012.  FINDINGS: Lung bases: Small pleural effusions. Lower lobe  opacity most consistent with atelectasis. Component of pneumonia is possible but felt likely. 5 mm nodule right lower lobe near the oblique fissure. No pulmonary edema. Heart mildly enlarged. Moderate size hiatal hernia.  Liver: 4.5 cm abnormality, which may reflect a mass, which lies along the posterior margin of the liver at the base caudate lobe and is inseparable from the the inferior vena cava. It also abuts the right adrenal gland. Longitudinal extent of this abnormality is approximately 6 cm.  Liver otherwise unremarkable.  Spleen: Unremarkable.  Gallbladder: Distended. No radiopaque stone. No wall thickening or adjacent inflammation. Common bile duct is dilated to approximate 1 cm but shows normal distal tapering.  Adrenal glands. The left adrenal gland thickening consistent with nodular hyperplasia. Right adrenal gland abuts the above described lesion. Is otherwise unremarkable.  Kidneys, ureters, bladder: 3.2 cm low-density lesion in the midpole of the left kidney consistent with cysts. No other renal masses. Bilateral renal cortical thinning. No stones. Bilateral  perinephric stranding, likely chronic. Left ureter is dilated. There is mild prominence of the left renal pelvis without calyx dilation. No ureteral stone. Normal right ureter. Bladder is decompressed by a Foley catheter.  Uterus and adnexa: Uterus is unremarkable. There is a left adnexal water density mass measuring 6.7 x 6.1 x 7.5 cm. This was present on the prior ultrasound and is unchanged in overall size. This could be ovarian origin. It may reflect an inclusion cyst. It stability since the 2014 ultrasound strongly supports a benign etiology.  Lymph nodes:  No enlarged lymph nodes.  Ascites:  None.  Gastrointestinal: No bowel dilation to suggest obstruction. No bowel wall thickening or mesenteric inflammation.  Musculoskeletal: Degenerative changes most evident at L2-L3. Bony structures are demineralized. No osteoblastic or osteolytic lesions.  IMPRESSION: 1. Heterogeneous masslike abnormality along the posterior margin of the liver, centered on the inferior vena cava, from which it is inseparable. It abuts the right adrenal gland. This may have an origin from the posterior margin of the liver or from the inferior vena cava. Adrenal origin is felt unlikely. This could be a neoplastic mass. This would be better evaluated with a contrast-enhanced CT scan or, if the patient can tolerate, MRI with and without contrast. 2. Small bilateral pleural effusions.  Lower lobe atelectasis. 3. Distended gallbladder without convincing acute cholecystitis. 4. No abdominal abscess. 5. Chronic left adnexal cyst measuring 7.5 cm in greatest dimension, which may be ovarian in origin or an inclusion cyst. It is similar in size to the ultrasound dated 02/27/2012. 6. No acute bowel abnormality.   Electronically Signed   By: Lajean Manes M.D.   On: 09/18/2014 12:15   Dg Chest 2 View  09/15/2014   CLINICAL DATA:  Shortness of breath. Pain when breathing for 2 days.  EXAM: CHEST  2 VIEW  COMPARISON:  03/20/2014  FINDINGS: Heart size  is mildly enlarged. Moderate hiatal hernia present. There are no focal consolidations. No pleural effusions. No pulmonary edema. Mild scoliosis.  IMPRESSION: 1. Cardiomegaly without edema. 2. Hiatal hernia.   Electronically Signed   By: Nolon Nations M.D.   On: 09/15/2014 11:31   Mr Liver Wo Contrast  09/21/2014   CLINICAL DATA:  Liver mass on CT  EXAM: MRI ABDOMEN WITHOUT CONTRAST  TECHNIQUE: Multiplanar multisequence MR imaging was performed without the administration of intravenous contrast.  COMPARISON:  CT abdomen pelvis dated 09/18/2014  FINDINGS: Severely motion degraded images.  Lower chest: Small bilateral pleural effusions. Associated bilateral lower lobe opacities, likely  atelectasis.  Hepatobiliary: Liver is grossly unremarkable. Specifically, no mass is seen. The CT abnormality is distinct from the right liver.  Layering gallstones versus gallbladder sludge (series 5/ image 23). No associated inflammatory changes.  No intrahepatic ductal dilatation. Common duct is mildly dilated with a suspected 5 mm distal CBD stone (series 5/ image 29).  Pancreas: Pancreatic atrophy. Pancreas is poorly evaluated due to motion degradation.  Spleen: Within normal limits.  Adrenals/Urinary Tract: Thickening of the left adrenal gland without discrete mass (series 5/image 26).  Right adrenal gland is incompletely visualized but mildly thickened (series 5/image 23).  2.0 x 2.5 cm well-circumscribed T2 hyperintense mass abutting the anterior aspect of the right adrenal gland and displacing the IVC anteriorly/laterally (series 5/image 20). It is unclear whether the mass is of primary adrenal origin or possibly originates along the posterior wall of the infrahepatic IVC. The lesion is new from 2011 but is poorly characterized on this motion degraded unenhanced MRI. Primary differential considerations include pheochromocytoma or sarcoma. If this is adrenal in etiology, metastasis would also be in the differential in the  setting of a known primary neoplasm.  3.0 cm left lower pole renal cyst (series 11/ image 33). Additional subcentimeter right lower pole renal cyst (series 11/ image 34). Bilateral renal scarring. No hydronephrosis.  Stomach/Bowel: Stomach is notable for a moderate to large hiatal hernia.  Visualized bowel is poorly evaluated/ grossly unremarkable.  Vascular/Lymphatic: No evidence of abdominal aortic aneurysm.  Infrahepatic IVC is effaced/displaced anteriorly, as described above.  No gross abdominal lymphadenopathy.  Other: No abdominal ascites.  Retroperitoneal standing.  Moderate body wall edema.  Musculoskeletal: Degenerative changes of the visualized thoracolumbar spine.  IMPRESSION: Severely motion degraded images. Evaluation is also limited due to lack of intravenous contrast administration.  2.5 cm well-circumscribed T2 hyperintense mass located between the intrahepatic IVC and right adrenal gland, as described above, exact organ of origin unclear. Primary differential considerations include pheochromocytoma, sarcoma, or possibly metastasis.  Liver is grossly unremarkable.  Additional ancillary findings as above.   Electronically Signed   By: Julian Hy M.D.   On: 09/21/2014 08:20   US Renal  09/22/2014   CLINICAL DATA:  Acute renal failure  EXAM: RENAL / URINARY TRACT ULTRASOUND COMPLETE  COMPARISON:  09/18/2014  FINDINGS: Right Kidney:  Length: 10.8 cm. Echogenicity within normal limits. No mass or hydronephrosis visualized.  Left Kidney:  Length: 11.6 cm. A 2.9 cm cyst is noted in the upper pole similar to that seen on prior CT examination.  Bladder:  Decompressed by Foley catheter. Note is made again of a left adnexal cyst.  IMPRESSION: Left renal cyst.  No other focal renal abnormality is noted.   Electronically Signed   By: Inez Catalina M.D.   On: 09/22/2014 11:00   Dg Chest Port 1 View  09/21/2014   CLINICAL DATA:  Acute renal insufficiency, pulmonary embolism and infarction, atrial  fibrillation, CHF.  EXAM: PORTABLE CHEST - 1 VIEW  COMPARISON:  Portable chest x-ray of September 17, 2014 and PA and lateral chest x-ray of August 05, 2013  FINDINGS: The lungs are mildly hypoinflated greater on the right than on the left. The cardiac silhouette is enlarged. The pulmonary vascularity is not engorged. A trace of pleural fluid may be present on the right. There is no alveolar infiltrate. There is density in the retrocardiac region which likely reflects a hiatal hernia given the appearance on the previous studies. The observed bony thorax is unremarkable.  IMPRESSION: There  is mild hypoinflation greater on the right than on the left. There is no acute cardiopulmonary abnormality.   Electronically Signed   By: David  Martinique M.D.   On: 09/21/2014 07:22   Dg Chest Port 1 View  09/17/2014   CLINICAL DATA:  Respiratory distress.  EXAM: PORTABLE CHEST - 1 VIEW  COMPARISON:  09/16/2014.  FINDINGS: Cardiac silhouette is mildly enlarged. Opacity in the retrocardiac region is likely moderate hiatal hernia. No hilar masses or adenopathy.  No lung consolidation or edema. No pleural effusion or pneumothorax.  Bony thorax is demineralized but grossly intact.  No significant change from the previous day's study.  IMPRESSION: No acute cardiopulmonary disease.   Electronically Signed   By: Lajean Manes M.D.   On: 09/17/2014 19:47   Dg Chest Port 1 View  09/16/2014   CLINICAL DATA:  Shortness of Breath  EXAM: PORTABLE CHEST - 1 VIEW  COMPARISON:  September 15, 2014  FINDINGS: There is no edema or consolidation. Heart is upper normal in size with pulmonary vascularity within normal limits. Moderate hiatal hernia is present. There is atherosclerotic change in the aorta. No adenopathy. No bone lesions.  IMPRESSION: No edema or consolidation. Moderate hiatal hernia. Atherosclerotic calcification in aorta.   Electronically Signed   By: Lowella Grip III M.D.   On: 09/16/2014 11:20    Leone Brand, MD 09/24/2014, 7:06  AM PGY-3, [facility] Intern pager: [phone], text pages welcome

## 2014-09-25 LAB — CBC
HCT: 31.5 % — ABNORMAL LOW (ref 36.0–46.0)
Hemoglobin: 11 g/dL — ABNORMAL LOW (ref 12.0–15.0)
MCH: 29.8 pg (ref 26.0–34.0)
MCHC: 34.9 g/dL (ref 30.0–36.0)
MCV: 85.4 fL (ref 78.0–100.0)
Platelets: 109 10*3/uL — ABNORMAL LOW (ref 150–400)
RBC: 3.69 MIL/uL — ABNORMAL LOW (ref 3.87–5.11)
RDW: 14.2 % (ref 11.5–15.5)
WBC: 7.6 10*3/uL (ref 4.0–10.5)

## 2014-09-25 LAB — BASIC METABOLIC PANEL
Anion gap: 12 (ref 5–15)
BUN: 55 mg/dL — ABNORMAL HIGH (ref 6–20)
CO2: 25 mmol/L (ref 22–32)
Calcium: 7.7 mg/dL — ABNORMAL LOW (ref 8.9–10.3)
Chloride: 96 mmol/L — ABNORMAL LOW (ref 101–111)
Creatinine, Ser: 4.34 mg/dL — ABNORMAL HIGH (ref 0.44–1.00)
GFR calc Af Amer: 10 mL/min — ABNORMAL LOW (ref >60)
GFR calc non Af Amer: 9 mL/min — ABNORMAL LOW (ref >60)
Glucose, Bld: 91 mg/dL (ref 65–99)
Potassium: 3.3 mmol/L — ABNORMAL LOW (ref 3.5–5.1)
Sodium: 133 mmol/L — ABNORMAL LOW (ref 135–145)

## 2014-09-25 MED ORDER — APIXABAN 2.5 MG PO TABS
2.5000 mg | ORAL_TABLET | Freq: Two times a day (BID) | ORAL | Status: DC
  Administered 2014-09-25 – 2014-09-27 (×4): 2.5 mg via ORAL
  Filled 2014-09-25 (×7): qty 1

## 2014-09-25 MED ORDER — POTASSIUM CHLORIDE CRYS ER 20 MEQ PO TBCR
40.0000 meq | EXTENDED_RELEASE_TABLET | Freq: Every day | ORAL | Status: DC
  Administered 2014-09-25 – 2014-09-26 (×2): 40 meq via ORAL
  Filled 2014-09-25 (×3): qty 2

## 2014-09-25 NOTE — Progress Notes (Signed)
Family Medicine Teaching Service Daily Progress Note Intern Pager: [phone]  Patient name: Sara Russell Medical record number: [id number] Date of birth: Apr 06, 1930 Age: 81 y.o. Gender: female  Primary Care Provider: Zigmund Gottron, MD Consultants: Nephrology, Palliative Care, Cardiology   Code Status: DNR   Pt Overview and Major Events to Date:  - Admitted 8/4 for SOB  and Dizziness  - 8/5 Acute decompensation with Acute Respiratory Distress, Hypotension, Atrial fibrillation with RVR  - 8/5 Transferred to CCM  - 8/5 Continued Hypotension, Diltiazem drip started/stopped - started Amiodarone drip  - 8/5  Critical Care started patient on Phenylephrine drip for blood pressures   - 8/5 ECHO completed 8/5  with EF 45-50% with septal hypokinesis, mild to moderate RV dilation with mildly decreased systolic function, mild to moderate pulmonary hypertension - 8/6 hypothermic with rigors 8/7 CT ab/pel > heterogenous masslike abnormality along posterior liver, large, unclear if mass or abscess, smsll bilat effusions, distended gallbladder, chronic adnexal cyst  Assessment and Plan: Sara Russell is a 81 y.o. female presenting with SOB, fatigue and dizziness. PMH is significant for afib, PE (currently on eliquis), HTN, HLD, diverticulosis, GERD, esophageal stricture s/p dilatation, anemia.   # Chronic A. fib with RVR. 8/5 ECHO EF 45-50% and mild to moderate pulmonary hypertension. 8/5-8/6 troponins trended between 0.25 -0.17, likely due to demand ischemia  - transfer to tele floor - Continue carvedilol 3.125 mg BID  - Continue Amiodarone 200 mg PO - Continue Metoprolol 2.5 mg PRN for HR> 120  - Restart Eliquis 5 mg  BID   # Acute oliguric renal failure -  On Admission SCr 2.25  - baseline 1. Patient requiring pressors from 8/5- 8/11. On 8/8 patient with oliguria at 200 cc over 24hrs. UA on 8/9 with protein and rare hgb. FENa of 1.48 on 8/9 which indicates either prerenal or ATN.  On 8/11  renal ultrasound benign. [place] trending down now 4.09>4.31>4.46> 4.34 - SCr now trending down to SCr 4.34  - Output of 1.55 Liters today  - Nephrology following  - Continue daily BMP  - lasix  80 BID (suspect some overdiuresis with Cr rise) - Continue strict I&Os    # Occult Positive Stools.  3x stools. Endoscopy (2012) lymphocytic colitis, colonoscopy (2007) positive for diverticulosis and 1x  rectal polyp biopsied. C.diff negative. Fecal Occult Blood test positive x 2 ( negative in 2011- 2012)  - FOBT negative on 8/13 - Hgb 10.8 < 12.1 < 11, remains stable  - Continue to monitor   # Hyponatremia. On admission Na 128. Likely due to Acute renal failure/chronic. Stable currently. - Continue to monitor   # Hypokalemia: 3.3 - replete 40 mEq kdur once   # Hypotension. Resolved. BP 131/58. Off pressors at 1 AM on 8/11.  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 Primary differential considerations include pheochromocytoma, sarcoma, or possibly metastasis.Patient does not want biopsy because she would not want surgery or chemotherapy - Consulted Palliative -Patient interested in hospice, continue life maintain treatment  - Urine Catacholamines ordered, however note pt was recently on phenylephrine for pressor support  # Resolved Septic Shock. On admission started on ceftriaxone for possible uti( UA + mod LE but only3-6 wbc).  CXR negative for eumonia. Decompensation over 24-36hr, with high fevers, rigors, tachycardia with afib with rvr, and hypotension. Broaden to vanco/piptazo and  transferred to the icu. Patient continued to have hypotension, fevers, rigors, leukocytosis. Ct abd/pelvis for  possible colitis with positive finding for a malignancy adjacent to the IVC causing a transient bacteremia. BCX 8/4 and 8/5 and UCx NGTD  - Antibiotics Vancomycin 8/5>>>8/9, Zosyn 8/5>>>8/7, Aztreonam 8/7>>>8/10, Meropenem 8/10>>>8/11  - Continue Protonix daily    # Acute  hypoxic respiratory failure - resolved. Currently on 2 L [place]. Saturations 95 %.  RR 20. CXR on 8/10 no acute process.  At risk for volume overload - Monitor respiratory status with IVF resuscitation - Continue to encourage incentive spirometry  - Titrate O2 for sat of 88-92%.  # Mild Normocytic Anemia. Hx of Anemia. Normal range hgb 12.0-14. Patient with long standing anemia on iron at home.  - pending repeat CBC - Continue to monitor   # Hx of PE/DVT: On Eliquis and reports compliance, doubt recurrent clot as cause of current symptoms - Holding home Eliquis  # HLD: Last lipid panel in 07/2013 with LDL 30 - Continue home atorvastatin  # FEN/GI: - Heart Diet  - home PPI   Prophylaxis: None  Disposition:transfer to tele, anticipate CIR  Subjective:  Feels better today. Does endorse some respiratory symptoms, feels a little congested but incentive spirometer has been helping. Minimal cough, non-productive. No chest pain, no dizziness.  Objective: Temp:  [97.5 F (36.4 C)-98.6 F (37 C)] 97.5 F (36.4 C) (08/14 0430) Pulse Rate:  [67-157] 95 (08/14 0430) Resp:  [20-22] 20 (08/14 0430) BP: (105-131)/(54-79) 131/58 mmHg (08/14 0430) SpO2:  [93 %-98 %] 95 % (08/14 0430) Weight:  [169 lb 9.6 oz (76.93 kg)-169 lb 12.8 oz (77.021 kg)] 169 lb 12.8 oz (77.021 kg) (08/14 0430)  Physical Exam: General: NAD, patient laying in bed, smiling  Cardiovascular: irregular rhythm,, radial pulses 2+ Respiratory: overall clear breath sounds, normal effort.  2 Liters [place]  In place  Abdomen: BS+, NTND  Extremities: SCDs in place, No LE edema.  Laboratory:  Recent Labs Lab 09/23/14 0228 09/24/14 0235 09/25/14 0305  WBC 8.6 7.8 7.6  HGB 10.8* 12.2 11.0*  HCT 30.8* 34.7* 31.5*  PLT 100* PLATELET CLUMPS NOTED ON SMEAR, COUNT APPEARS DECREASED 109*    Recent Labs Lab 09/19/14 0554  09/23/14 0228 09/24/14 0235 09/25/14 0305  NA 131*  < > 133* 133* 133*  K 5.6*  < > 3.4* 3.3* 3.3*  CL  103  < > 98* 97* 96*  CO2 15*  < > 21* 21* 25  BUN 36*  < > 50* 55* 55*  CREATININE 3.00*  < > 4.31* 4.46* 4.34*  CALCIUM 7.5*  < > 7.5* 7.7* 7.7*  PROT 5.7*  --   --   --   --   BILITOT 0.7  --   --   --   --   ALKPHOS 174*  --   --   --   --   ALT 19  --   --   --   --   AST 29  --   --   --   --   GLUCOSE 143*  < > 97 96 91  < > = values in this interval not displayed.  Imaging/Diagnostic Tests:  Ct Abdomen Pelvis Wo Contrast  09/18/2014   CLINICAL DATA:  SepsisHx basal and squamous cell ca., diverticulosis, GERDSurgery's; D&C, tonsillectomy, appendectomy, breast lumpectomy(benign)  EXAM: CT ABDOMEN AND PELVIS WITHOUT CONTRAST  TECHNIQUE: Multidetector CT imaging of the abdomen and pelvis was performed following the standard protocol without IV contrast.  COMPARISON:  Pelvic ultrasound, 02/27/2012.  FINDINGS: Lung bases: Small pleural  effusions. Lower lobe opacity most consistent with atelectasis. Component of pneumonia is possible but felt likely. 5 mm nodule right lower lobe near the oblique fissure. No pulmonary edema. Heart mildly enlarged. Moderate size hiatal hernia.  Liver: 4.5 cm abnormality, which may reflect a mass, which lies along the posterior margin of the liver at the base caudate lobe and is inseparable from the the inferior vena cava. It also abuts the right adrenal gland. Longitudinal extent of this abnormality is approximately 6 cm.  Liver otherwise unremarkable.  Spleen: Unremarkable.  Gallbladder: Distended. No radiopaque stone. No wall thickening or adjacent inflammation. Common bile duct is dilated to approximate 1 cm but shows normal distal tapering.  Adrenal glands. The left adrenal gland thickening consistent with nodular hyperplasia. Right adrenal gland abuts the above described lesion. Is otherwise unremarkable.  Kidneys, ureters, bladder: 3.2 cm low-density lesion in the midpole of the left kidney consistent with cysts. No other renal masses. Bilateral renal cortical  thinning. No stones. Bilateral perinephric stranding, likely chronic. Left ureter is dilated. There is mild prominence of the left renal pelvis without calyx dilation. No ureteral stone. Normal right ureter. Bladder is decompressed by a Foley catheter.  Uterus and adnexa: Uterus is unremarkable. There is a left adnexal water density mass measuring 6.7 x 6.1 x 7.5 cm. This was present on the prior ultrasound and is unchanged in overall size. This could be ovarian origin. It may reflect an inclusion cyst. It stability since the 2014 ultrasound strongly supports a benign etiology.  Lymph nodes:  No enlarged lymph nodes.  Ascites:  None.  Gastrointestinal: No bowel dilation to suggest obstruction. No bowel wall thickening or mesenteric inflammation.  Musculoskeletal: Degenerative changes most evident at L2-L3. Bony structures are demineralized. No osteoblastic or osteolytic lesions.  IMPRESSION: 1. Heterogeneous masslike abnormality along the posterior margin of the liver, centered on the inferior vena cava, from which it is inseparable. It abuts the right adrenal gland. This may have an origin from the posterior margin of the liver or from the inferior vena cava. Adrenal origin is felt unlikely. This could be a neoplastic mass. This would be better evaluated with a contrast-enhanced CT scan or, if the patient can tolerate, MRI with and without contrast. 2. Small bilateral pleural effusions.  Lower lobe atelectasis. 3. Distended gallbladder without convincing acute cholecystitis. 4. No abdominal abscess. 5. Chronic left adnexal cyst measuring 7.5 cm in greatest dimension, which may be ovarian in origin or an inclusion cyst. It is similar in size to the ultrasound dated 02/27/2012. 6. No acute bowel abnormality.   Electronically Signed   By: Lajean Manes M.D.   On: 09/18/2014 12:15   Dg Chest 2 View  09/15/2014   CLINICAL DATA:  Shortness of breath. Pain when breathing for 2 days.  EXAM: CHEST  2 VIEW  COMPARISON:   03/20/2014  FINDINGS: Heart size is mildly enlarged. Moderate hiatal hernia present. There are no focal consolidations. No pleural effusions. No pulmonary edema. Mild scoliosis.  IMPRESSION: 1. Cardiomegaly without edema. 2. Hiatal hernia.   Electronically Signed   By: Nolon Nations M.D.   On: 09/15/2014 11:31   Mr Liver Wo Contrast  09/21/2014   CLINICAL DATA:  Liver mass on CT  EXAM: MRI ABDOMEN WITHOUT CONTRAST  TECHNIQUE: Multiplanar multisequence MR imaging was performed without the administration of intravenous contrast.  COMPARISON:  CT abdomen pelvis dated 09/18/2014  FINDINGS: Severely motion degraded images.  Lower chest: Small bilateral pleural effusions. Associated bilateral lower  lobe opacities, likely atelectasis.  Hepatobiliary: Liver is grossly unremarkable. Specifically, no mass is seen. The CT abnormality is distinct from the right liver.  Layering gallstones versus gallbladder sludge (series 5/ image 23). No associated inflammatory changes.  No intrahepatic ductal dilatation. Common duct is mildly dilated with a suspected 5 mm distal CBD stone (series 5/ image 29).  Pancreas: Pancreatic atrophy. Pancreas is poorly evaluated due to motion degradation.  Spleen: Within normal limits.  Adrenals/Urinary Tract: Thickening of the left adrenal gland without discrete mass (series 5/image 26).  Right adrenal gland is incompletely visualized but mildly thickened (series 5/image 23).  2.0 x 2.5 cm well-circumscribed T2 hyperintense mass abutting the anterior aspect of the right adrenal gland and displacing the IVC anteriorly/laterally (series 5/image 20). It is unclear whether the mass is of primary adrenal origin or possibly originates along the posterior wall of the infrahepatic IVC. The lesion is new from 2011 but is poorly characterized on this motion degraded unenhanced MRI. Primary differential considerations include pheochromocytoma or sarcoma. If this is adrenal in etiology, metastasis would  also be in the differential in the setting of a known primary neoplasm.  3.0 cm left lower pole renal cyst (series 11/ image 33). Additional subcentimeter right lower pole renal cyst (series 11/ image 34). Bilateral renal scarring. No hydronephrosis.  Stomach/Bowel: Stomach is notable for a moderate to large hiatal hernia.  Visualized bowel is poorly evaluated/ grossly unremarkable.  Vascular/Lymphatic: No evidence of abdominal aortic aneurysm.  Infrahepatic IVC is effaced/displaced anteriorly, as described above.  No gross abdominal lymphadenopathy.  Other: No abdominal ascites.  Retroperitoneal standing.  Moderate body wall edema.  Musculoskeletal: Degenerative changes of the visualized thoracolumbar spine.  IMPRESSION: Severely motion degraded images. Evaluation is also limited due to lack of intravenous contrast administration.  2.5 cm well-circumscribed T2 hyperintense mass located between the intrahepatic IVC and right adrenal gland, as described above, exact organ of origin unclear. Primary differential considerations include pheochromocytoma, sarcoma, or possibly metastasis.  Liver is grossly unremarkable.  Additional ancillary findings as above.   Electronically Signed   By: Julian Hy M.D.   On: 09/21/2014 08:20   US Renal  09/22/2014   CLINICAL DATA:  Acute renal failure  EXAM: RENAL / URINARY TRACT ULTRASOUND COMPLETE  COMPARISON:  09/18/2014  FINDINGS: Right Kidney:  Length: 10.8 cm. Echogenicity within normal limits. No mass or hydronephrosis visualized.  Left Kidney:  Length: 11.6 cm. A 2.9 cm cyst is noted in the upper pole similar to that seen on prior CT examination.  Bladder:  Decompressed by Foley catheter. Note is made again of a left adnexal cyst.  IMPRESSION: Left renal cyst.  No other focal renal abnormality is noted.   Electronically Signed   By: Inez Catalina M.D.   On: 09/22/2014 11:00   Dg Chest Port 1 View  09/21/2014   CLINICAL DATA:  Acute renal insufficiency, pulmonary  embolism and infarction, atrial fibrillation, CHF.  EXAM: PORTABLE CHEST - 1 VIEW  COMPARISON:  Portable chest x-ray of September 17, 2014 and PA and lateral chest x-ray of August 05, 2013  FINDINGS: The lungs are mildly hypoinflated greater on the right than on the left. The cardiac silhouette is enlarged. The pulmonary vascularity is not engorged. A trace of pleural fluid may be present on the right. There is no alveolar infiltrate. There is density in the retrocardiac region which likely reflects a hiatal hernia given the appearance on the previous studies. The observed bony thorax is unremarkable.  IMPRESSION: There is mild hypoinflation greater on the right than on the left. There is no acute cardiopulmonary abnormality.   Electronically Signed   By: David  Martinique M.D.   On: 09/21/2014 07:22   Dg Chest Port 1 View  09/17/2014   CLINICAL DATA:  Respiratory distress.  EXAM: PORTABLE CHEST - 1 VIEW  COMPARISON:  09/16/2014.  FINDINGS: Cardiac silhouette is mildly enlarged. Opacity in the retrocardiac region is likely moderate hiatal hernia. No hilar masses or adenopathy.  No lung consolidation or edema. No pleural effusion or pneumothorax.  Bony thorax is demineralized but grossly intact.  No significant change from the previous day's study.  IMPRESSION: No acute cardiopulmonary disease.   Electronically Signed   By: Lajean Manes M.D.   On: 09/17/2014 19:47   Dg Chest Port 1 View  09/16/2014   CLINICAL DATA:  Shortness of Breath  EXAM: PORTABLE CHEST - 1 VIEW  COMPARISON:  September 15, 2014  FINDINGS: There is no edema or consolidation. Heart is upper normal in size with pulmonary vascularity within normal limits. Moderate hiatal hernia is present. There is atherosclerotic change in the aorta. No adenopathy. No bone lesions.  IMPRESSION: No edema or consolidation. Moderate hiatal hernia. Atherosclerotic calcification in aorta.   Electronically Signed   By: Lowella Grip III M.D.   On: 09/16/2014 11:20    Adelyne Marchese  Cletis Media, MD 09/25/2014, 7:20 AM PGY-1, [place] Intern pager: [phone], text pages welcome

## 2014-09-25 NOTE — Progress Notes (Signed)
ANTICOAGULATION CONSULT NOTE - Initial Consult  Pharmacy Consult for apixaban  Indication: atrial fibrillation  Allergies  Allergen Reactions  . Iohexol Other (See Comments)    unknown  . Ivp Dye [Iodinated Diagnostic Agents] Hives  . Scallops [Shellfish Allergy] Other (See Comments)    Projectile vomiting.  . Sulfa Antibiotics Hives  . Sulfamethoxazole Hives    REACTION: unspecified    Patient Measurements: Height: 5' 1"  (154.9 cm) Weight: 169 lb 12.8 oz (77.021 kg) IBW/kg (Calculated) : 47.8 Heparin Dosing Weight:   Vital Signs: Temp: 97.5 F (36.4 C) (08/14 0430) Temp Source: Oral (08/14 0430) BP: 116/78 mmHg (08/14 1016) Pulse Rate: 105 (08/14 1016)  Labs:  Recent Labs  09/23/14 0228 09/24/14 0235 09/25/14 0305  HGB 10.8* 12.2 11.0*  HCT 30.8* 34.7* 31.5*  PLT 100* PLATELET CLUMPS NOTED ON SMEAR, COUNT APPEARS DECREASED 109*  CREATININE 4.31* 4.46* 4.34*    Estimated Creatinine Clearance: 9.4 mL/min (by C-G formula based on Cr of 4.34).   Medical History: Past Medical History  Diagnosis Date  . S/P dilatation of esophageal stricture 2002  . Hypertension   . Hyperlipidemia   . DVT (deep venous thrombosis) 2009  . PE (pulmonary embolism) 2009  . GERD (gastroesophageal reflux disease)   . Diverticulosis   . Iron deficiency anemia   . Hyperplastic colon polyp   . Positive H. pylori test 01/20/96  . Degenerative joint disease   . Skin cancer     h/o basal and squamous skin cancer--> removed  . Persistent atrial fibrillation   . Shortness of breath dyspnea   . AKI (acute kidney injury) 09/15/2014  . Colitis, ulcerative     Medications:  Scheduled:  . amiodarone  200 mg Oral Daily  . atorvastatin  10 mg Oral q1800  . carvedilol  3.125 mg Oral BID WC  . feeding supplement (NEPRO CARB STEADY)  237 mL Oral TID BM  . furosemide  80 mg Intravenous BID  . pantoprazole  40 mg Oral Daily  . potassium chloride  40 mEq Oral Daily   Infusions:  .  sodium chloride Stopped (09/17/14 1723)  . sodium chloride Stopped (09/20/14 1930)    Assessment: 81 yo female with afib will be started on apixaban.  Age > 80 and SCr >1.5 warrant lower dose of apixaban.  Goal of Therapy:   Monitor platelets by anticoagulation protocol: Yes   Plan:  - start apixaban 2.5 mg po bid - monitor renal function and CBC closely  Ibn Stief, Tsz-Yin 09/25/2014,10:59 AM

## 2014-09-25 NOTE — Progress Notes (Signed)
    SUBJECTIVE:  Denies any acute SOB or pain.  She is weak but is getting up today.    PHYSICAL EXAM Filed Vitals:   09/24/14 1217 09/24/14 2013 09/25/14 0208 09/25/14 0430  BP: 107/61 127/79 105/61 131/58  Pulse: 93 70 67 95  Temp: 97.9 F (36.6 C) 98.4 F (36.9 C) 98.6 F (37 C) 97.5 F (36.4 C)  TempSrc: Oral Oral Oral Oral  Resp: 22 20 20 20   Height: 5' 1"  (1.549 m)     Weight: 169 lb 9.6 oz (76.93 kg)   169 lb 12.8 oz (77.021 kg)  SpO2: 98% 95% 96% 95%   General:  No distress Lungs:  Decreased breath sounds with coarse crackles at the bases Heart:  Irregular Abdomen:  Positive bowel sounds, no rebound no guarding Extremities:  No edema   LABS:  Results for orders placed or performed during the hospital encounter of 09/15/14 (from the past 24 hour(s))  CBC     Status: Abnormal   Collection Time: 09/25/14  3:05 AM  Result Value Ref Range   WBC 7.6 4.0 - 10.5 K/uL   RBC 3.69 (L) 3.87 - 5.11 MIL/uL   Hemoglobin 11.0 (L) 12.0 - 15.0 g/dL   HCT 31.5 (L) 36.0 - 46.0 %   MCV 85.4 78.0 - 100.0 fL   MCH 29.8 26.0 - 34.0 pg   MCHC 34.9 30.0 - 36.0 g/dL   RDW 14.2 11.5 - 15.5 %   Platelets 109 (L) 150 - 400 K/uL  Basic metabolic panel     Status: Abnormal   Collection Time: 09/25/14  3:05 AM  Result Value Ref Range   Sodium 133 (L) 135 - 145 mmol/L   Potassium 3.3 (L) 3.5 - 5.1 mmol/L   Chloride 96 (L) 101 - 111 mmol/L   CO2 25 22 - 32 mmol/L   Glucose, Bld 91 65 - 99 mg/dL   BUN 55 (H) 6 - 20 mg/dL   Creatinine, Ser 4.34 (H) 0.44 - 1.00 mg/dL   Calcium 7.7 (L) 8.9 - 10.3 mg/dL   GFR calc non Af Amer 9 (L) >60 mL/min   GFR calc Af Amer 10 (L) >60 mL/min   Anion gap 12 5 - 15    Intake/Output Summary (Last 24 hours) at 09/25/14 1001 Last data filed at 09/25/14 3016  Gross per 24 hour  Intake    340 ml  Output   2075 ml  Net  -1735 ml    ASSESSMENT AND PLAN:  ATRIAL FIB WITH RVR:   Continue amiodarone and current dose Coreg.  Telemetry was reviewed and the  rate is OK.    HYPOTENSION:   Improved but still labile.  Continue current meds.  CKD:  Creat seems to have crested.  Renal is following.    We will follow as needed.   Jeneen Rinks [facility] 09/25/2014 10:01 AM

## 2014-09-25 NOTE — Progress Notes (Signed)
S: Feels well, no new CO  Had 3 nepros O:BP 131/58 mmHg  Pulse 95  Temp(Src) 97.5 F (36.4 C) (Oral)  Resp 20  Ht 5' 1"  (1.549 m)  Wt 77.021 kg (169 lb 12.8 oz)  BMI 32.10 kg/m2  SpO2 95%  Intake/Output Summary (Last 24 hours) at 09/25/14 0837 Last data filed at 09/25/14 0649  Gross per 24 hour  Intake    680 ml  Output   1550 ml  Net   -870 ml   Weight change:  IWP:YKDXI and alert PJA:SNKNL, irreg Resp:Few faint basilar crackles Abd:+ BS NTND Ext:0-tr edema NEURO:CNI Ox3 no asterixis   . amiodarone  200 mg Oral Daily  . atorvastatin  10 mg Oral q1800  . carvedilol  3.125 mg Oral BID WC  . feeding supplement (NEPRO CARB STEADY)  237 mL Oral TID BM  . furosemide  80 mg Intravenous BID  . pantoprazole  40 mg Oral Daily  . potassium chloride  40 mEq Oral Daily   No results found. BMET    Component Value Date/Time   NA 133* 09/25/2014 0305   K 3.3* 09/25/2014 0305   CL 96* 09/25/2014 0305   CO2 25 09/25/2014 0305   GLUCOSE 91 09/25/2014 0305   BUN 55* 09/25/2014 0305   CREATININE 4.34* 09/25/2014 0305   CREATININE 0.83 09/10/2013 1026   CALCIUM 7.7* 09/25/2014 0305   GFRNONAA 9* 09/25/2014 0305   GFRNONAA 54* 05/18/2012 0839   GFRAA 10* 09/25/2014 0305   GFRAA 63 05/18/2012 0839   CBC    Component Value Date/Time   WBC 7.6 09/25/2014 0305   RBC 3.69* 09/25/2014 0305   RBC 4.03 07/06/2009 2249   HGB 11.0* 09/25/2014 0305   HGB 14.2 08/28/2010 1041   HCT 31.5* 09/25/2014 0305   PLT 109* 09/25/2014 0305   MCV 85.4 09/25/2014 0305   MCH 29.8 09/25/2014 0305   MCHC 34.9 09/25/2014 0305   RDW 14.2 09/25/2014 0305   LYMPHSABS 0.3* 09/21/2014 0246   MONOABS 0.5 09/21/2014 0246   EOSABS 0.0 09/21/2014 0246   BASOSABS 0.0 09/21/2014 0246     Assessment:  1. ARF sec ischemic ATN in face of ACE, UO good, [place] sl lower 2. Mass between Rt adrenal gland and IVC 3. Chronic A fib 4. Met acidosis, improved 5. Mild hypokalemia  Plan: 1. Replace K 2. Daily  labs 3. Cont lasix for now   Sara Russell

## 2014-09-26 DIAGNOSIS — Z7901 Long term (current) use of anticoagulants: Secondary | ICD-10-CM

## 2014-09-26 LAB — BASIC METABOLIC PANEL
Anion gap: 14 (ref 5–15)
BUN: 58 mg/dL — ABNORMAL HIGH (ref 6–20)
CO2: 24 mmol/L (ref 22–32)
Calcium: 7.8 mg/dL — ABNORMAL LOW (ref 8.9–10.3)
Chloride: 96 mmol/L — ABNORMAL LOW (ref 101–111)
Creatinine, Ser: 4.37 mg/dL — ABNORMAL HIGH (ref 0.44–1.00)
GFR calc Af Amer: 10 mL/min — ABNORMAL LOW (ref >60)
GFR calc non Af Amer: 9 mL/min — ABNORMAL LOW (ref >60)
Glucose, Bld: 92 mg/dL (ref 65–99)
Potassium: 3.3 mmol/L — ABNORMAL LOW (ref 3.5–5.1)
Sodium: 134 mmol/L — ABNORMAL LOW (ref 135–145)

## 2014-09-26 LAB — CBC
HCT: 32.3 % — ABNORMAL LOW (ref 36.0–46.0)
Hemoglobin: 11.1 g/dL — ABNORMAL LOW (ref 12.0–15.0)
MCH: 29.1 pg (ref 26.0–34.0)
MCHC: 34.4 g/dL (ref 30.0–36.0)
MCV: 84.6 fL (ref 78.0–100.0)
Platelets: 123 10*3/uL — ABNORMAL LOW (ref 150–400)
RBC: 3.82 MIL/uL — ABNORMAL LOW (ref 3.87–5.11)
RDW: 14.2 % (ref 11.5–15.5)
WBC: 10.7 10*3/uL — ABNORMAL HIGH (ref 4.0–10.5)

## 2014-09-26 LAB — CATECHOLAMINES, FRACTIONATED, PLASMA
Dopamine: 195 pg/mL — ABNORMAL HIGH (ref 0–48)
Epinephrine: 32 pg/mL (ref 0–62)
Norepinephrine: 984 pg/mL — ABNORMAL HIGH (ref 0–874)

## 2014-09-26 MED ORDER — POTASSIUM CHLORIDE CRYS ER 20 MEQ PO TBCR
20.0000 meq | EXTENDED_RELEASE_TABLET | Freq: Once | ORAL | Status: AC
  Administered 2014-09-26: 20 meq via ORAL

## 2014-09-26 MED ORDER — CARVEDILOL 6.25 MG PO TABS
6.2500 mg | ORAL_TABLET | Freq: Two times a day (BID) | ORAL | Status: DC
  Administered 2014-09-26 – 2014-09-27 (×3): 6.25 mg via ORAL
  Filled 2014-09-26 (×6): qty 1

## 2014-09-26 MED ORDER — CARVEDILOL 6.25 MG PO TABS: 6.2500 mg | ORAL_TABLET | Freq: Two times a day (BID) | ORAL | Status: DC

## 2014-09-26 MED ORDER — POTASSIUM CHLORIDE CRYS ER 20 MEQ PO TBCR
60.0000 meq | EXTENDED_RELEASE_TABLET | Freq: Every day | ORAL | Status: DC
  Administered 2014-09-27: 60 meq via ORAL
  Filled 2014-09-26 (×2): qty 3

## 2014-09-26 NOTE — Care Management Important Message (Signed)
Important Message  Patient Details  Name: Sara Russell MRN: [id number] Date of Birth: 31-Mar-1930   Medicare Important Message Given:  Yes-fourth notification given    Pricilla Handler 09/26/2014, 3:08 PM

## 2014-09-26 NOTE — Progress Notes (Signed)
Patient Name: Sara Russell Date of Encounter: 09/26/2014   SUBJECTIVE  Feels better. Denies chest pain, sob or palpitation. Plan to discharge inpatient rehab, likely tomorrow.   CURRENT MEDS . amiodarone  200 mg Oral Daily  . apixaban  2.5 mg Oral BID  . atorvastatin  10 mg Oral q1800  . carvedilol  3.125 mg Oral BID WC  . feeding supplement (NEPRO CARB STEADY)  237 mL Oral TID BM  . furosemide  80 mg Intravenous BID  . pantoprazole  40 mg Oral Daily  . potassium chloride  40 mEq Oral Daily    OBJECTIVE  Filed Vitals:   09/25/14 2048 09/25/14 2122 09/25/14 2314 09/26/14 0542  BP: 131/101 128/68  118/61  Pulse: 118 101  101  Temp: 100 F (37.8 C) 99.6 F (37.6 C) 98.7 F (37.1 C) 97.5 F (36.4 C)  TempSrc: Oral Oral  Oral  Resp: 18 18  19   Height:      Weight:    165 lb 14.4 oz (75.252 kg)  SpO2: 95% 98%  97%    Intake/Output Summary (Last 24 hours) at 09/26/14 0855 Last data filed at 09/26/14 0845  Gross per 24 hour  Intake    480 ml  Output   2325 ml  Net  -1845 ml   Filed Weights   09/24/14 1217 09/25/14 0430 09/26/14 0542  Weight: 169 lb 9.6 oz (76.93 kg) 169 lb 12.8 oz (77.021 kg) 165 lb 14.4 oz (75.252 kg)    PHYSICAL EXAM  General: Pleasant, NAD. Neuro: Alert and oriented X 3. Moves all extremities spontaneously. Psych: Normal affect. HEENT:  Normal  Neck: Supple without bruits. + JVD. Lungs:  Resp regular and unlabored, diminished bibasilar breath sound.  Heart: irregular no s3, s4.  Abdomen: Soft, non-tender, non-distended, BS + x 4.  Extremities: No clubbing, cyanosis or edema. DP/PT/Radials 2+ and equal bilaterally. Compression stocking in place.   Accessory Clinical Findings  CBC  Recent Labs  09/25/14 0305 09/26/14 0323  WBC 7.6 10.7*  HGB 11.0* 11.1*  HCT 31.5* 32.3*  MCV 85.4 84.6  PLT 109* 157*   Basic Metabolic Panel  Recent Labs  09/25/14 0305 09/26/14 0323  NA 133* 134*  K 3.3* 3.3*  CL 96* 96*  CO2 25 24    GLUCOSE 91 92  BUN 55* 58*  CREATININE 4.34* 4.37*  CALCIUM 7.7* 7.8*    TELE  afib at rate of 100s.   Radiology/Studies  Ct Abdomen Pelvis Wo Contrast  09/18/2014   CLINICAL DATA:  SepsisHx basal and squamous cell ca., diverticulosis, GERDSurgery's; D&C, tonsillectomy, appendectomy, breast lumpectomy(benign)  EXAM: CT ABDOMEN AND PELVIS WITHOUT CONTRAST  TECHNIQUE: Multidetector CT imaging of the abdomen and pelvis was performed following the standard protocol without IV contrast.  COMPARISON:  Pelvic ultrasound, 02/27/2012.  FINDINGS: Lung bases: Small pleural effusions. Lower lobe opacity most consistent with atelectasis. Component of pneumonia is possible but felt likely. 5 mm nodule right lower lobe near the oblique fissure. No pulmonary edema. Heart mildly enlarged. Moderate size hiatal hernia.  Liver: 4.5 cm abnormality, which may reflect a mass, which lies along the posterior margin of the liver at the base caudate lobe and is inseparable from the the inferior vena cava. It also abuts the right adrenal gland. Longitudinal extent of this abnormality is approximately 6 cm.  Liver otherwise unremarkable.  Spleen: Unremarkable.  Gallbladder: Distended. No radiopaque stone. No wall thickening or adjacent inflammation. Common bile duct is dilated to  approximate 1 cm but shows normal distal tapering.  Adrenal glands. The left adrenal gland thickening consistent with nodular hyperplasia. Right adrenal gland abuts the above described lesion. Is otherwise unremarkable.  Kidneys, ureters, bladder: 3.2 cm low-density lesion in the midpole of the left kidney consistent with cysts. No other renal masses. Bilateral renal cortical thinning. No stones. Bilateral perinephric stranding, likely chronic. Left ureter is dilated. There is mild prominence of the left renal pelvis without calyx dilation. No ureteral stone. Normal right ureter. Bladder is decompressed by a Foley catheter.  Uterus and adnexa: Uterus is  unremarkable. There is a left adnexal water density mass measuring 6.7 x 6.1 x 7.5 cm. This was present on the prior ultrasound and is unchanged in overall size. This could be ovarian origin. It may reflect an inclusion cyst. It stability since the 2014 ultrasound strongly supports a benign etiology.  Lymph nodes:  No enlarged lymph nodes.  Ascites:  None.  Gastrointestinal: No bowel dilation to suggest obstruction. No bowel wall thickening or mesenteric inflammation.  Musculoskeletal: Degenerative changes most evident at L2-L3. Bony structures are demineralized. No osteoblastic or osteolytic lesions.  IMPRESSION: 1. Heterogeneous masslike abnormality along the posterior margin of the liver, centered on the inferior vena cava, from which it is inseparable. It abuts the right adrenal gland. This may have an origin from the posterior margin of the liver or from the inferior vena cava. Adrenal origin is felt unlikely. This could be a neoplastic mass. This would be better evaluated with a contrast-enhanced CT scan or, if the patient can tolerate, MRI with and without contrast. 2. Small bilateral pleural effusions.  Lower lobe atelectasis. 3. Distended gallbladder without convincing acute cholecystitis. 4. No abdominal abscess. 5. Chronic left adnexal cyst measuring 7.5 cm in greatest dimension, which may be ovarian in origin or an inclusion cyst. It is similar in size to the ultrasound dated 02/27/2012. 6. No acute bowel abnormality.   Electronically Signed   By: Lajean Manes M.D.   On: 09/18/2014 12:15   Dg Chest 2 View  09/15/2014   CLINICAL DATA:  Shortness of breath. Pain when breathing for 2 days.  EXAM: CHEST  2 VIEW  COMPARISON:  03/20/2014  FINDINGS: Heart size is mildly enlarged. Moderate hiatal hernia present. There are no focal consolidations. No pleural effusions. No pulmonary edema. Mild scoliosis.  IMPRESSION: 1. Cardiomegaly without edema. 2. Hiatal hernia.   Electronically Signed   By: Nolon Nations M.D.   On: 09/15/2014 11:31   Mr Liver Wo Contrast  09/21/2014   CLINICAL DATA:  Liver mass on CT  EXAM: MRI ABDOMEN WITHOUT CONTRAST  TECHNIQUE: Multiplanar multisequence MR imaging was performed without the administration of intravenous contrast.  COMPARISON:  CT abdomen pelvis dated 09/18/2014  FINDINGS: Severely motion degraded images.  Lower chest: Small bilateral pleural effusions. Associated bilateral lower lobe opacities, likely atelectasis.  Hepatobiliary: Liver is grossly unremarkable. Specifically, no mass is seen. The CT abnormality is distinct from the right liver.  Layering gallstones versus gallbladder sludge (series 5/ image 23). No associated inflammatory changes.  No intrahepatic ductal dilatation. Common duct is mildly dilated with a suspected 5 mm distal CBD stone (series 5/ image 29).  Pancreas: Pancreatic atrophy. Pancreas is poorly evaluated due to motion degradation.  Spleen: Within normal limits.  Adrenals/Urinary Tract: Thickening of the left adrenal gland without discrete mass (series 5/image 26).  Right adrenal gland is incompletely visualized but mildly thickened (series 5/image 23).  2.0 x 2.5 cm  well-circumscribed T2 hyperintense mass abutting the anterior aspect of the right adrenal gland and displacing the IVC anteriorly/laterally (series 5/image 20). It is unclear whether the mass is of primary adrenal origin or possibly originates along the posterior wall of the infrahepatic IVC. The lesion is new from 2011 but is poorly characterized on this motion degraded unenhanced MRI. Primary differential considerations include pheochromocytoma or sarcoma. If this is adrenal in etiology, metastasis would also be in the differential in the setting of a known primary neoplasm.  3.0 cm left lower pole renal cyst (series 11/ image 33). Additional subcentimeter right lower pole renal cyst (series 11/ image 34). Bilateral renal scarring. No hydronephrosis.  Stomach/Bowel: Stomach is  notable for a moderate to large hiatal hernia.  Visualized bowel is poorly evaluated/ grossly unremarkable.  Vascular/Lymphatic: No evidence of abdominal aortic aneurysm.  Infrahepatic IVC is effaced/displaced anteriorly, as described above.  No gross abdominal lymphadenopathy.  Other: No abdominal ascites.  Retroperitoneal standing.  Moderate body wall edema.  Musculoskeletal: Degenerative changes of the visualized thoracolumbar spine.  IMPRESSION: Severely motion degraded images. Evaluation is also limited due to lack of intravenous contrast administration.  2.5 cm well-circumscribed T2 hyperintense mass located between the intrahepatic IVC and right adrenal gland, as described above, exact organ of origin unclear. Primary differential considerations include pheochromocytoma, sarcoma, or possibly metastasis.  Liver is grossly unremarkable.  Additional ancillary findings as above.   Electronically Signed   By: Julian Hy M.D.   On: 09/21/2014 08:20   US Renal  09/22/2014   CLINICAL DATA:  Acute renal failure  EXAM: RENAL / URINARY TRACT ULTRASOUND COMPLETE  COMPARISON:  09/18/2014  FINDINGS: Right Kidney:  Length: 10.8 cm. Echogenicity within normal limits. No mass or hydronephrosis visualized.  Left Kidney:  Length: 11.6 cm. A 2.9 cm cyst is noted in the upper pole similar to that seen on prior CT examination.  Bladder:  Decompressed by Foley catheter. Note is made again of a left adnexal cyst.  IMPRESSION: Left renal cyst.  No other focal renal abnormality is noted.   Electronically Signed   By: Inez Catalina M.D.   On: 09/22/2014 11:00   Dg Chest Port 1 View  09/21/2014   CLINICAL DATA:  Acute renal insufficiency, pulmonary embolism and infarction, atrial fibrillation, CHF.  EXAM: PORTABLE CHEST - 1 VIEW  COMPARISON:  Portable chest x-ray of September 17, 2014 and PA and lateral chest x-ray of August 05, 2013  FINDINGS: The lungs are mildly hypoinflated greater on the right than on the left. The cardiac  silhouette is enlarged. The pulmonary vascularity is not engorged. A trace of pleural fluid may be present on the right. There is no alveolar infiltrate. There is density in the retrocardiac region which likely reflects a hiatal hernia given the appearance on the previous studies. The observed bony thorax is unremarkable.  IMPRESSION: There is mild hypoinflation greater on the right than on the left. There is no acute cardiopulmonary abnormality.   Electronically Signed   By: David  Martinique M.D.   On: 09/21/2014 07:22   Dg Chest Port 1 View  09/17/2014   CLINICAL DATA:  Respiratory distress.  EXAM: PORTABLE CHEST - 1 VIEW  COMPARISON:  09/16/2014.  FINDINGS: Cardiac silhouette is mildly enlarged. Opacity in the retrocardiac region is likely moderate hiatal hernia. No hilar masses or adenopathy.  No lung consolidation or edema. No pleural effusion or pneumothorax.  Bony thorax is demineralized but grossly intact.  No significant change from the  previous day's study.  IMPRESSION: No acute cardiopulmonary disease.   Electronically Signed   By: Lajean Manes M.D.   On: 09/17/2014 19:47   Dg Chest Port 1 View  09/16/2014   CLINICAL DATA:  Shortness of Breath  EXAM: PORTABLE CHEST - 1 VIEW  COMPARISON:  September 15, 2014  FINDINGS: There is no edema or consolidation. Heart is upper normal in size with pulmonary vascularity within normal limits. Moderate hiatal hernia is present. There is atherosclerotic change in the aorta. No adenopathy. No bone lesions.  IMPRESSION: No edema or consolidation. Moderate hiatal hernia. Atherosclerotic calcification in aorta.   Electronically Signed   By: Lowella Grip III M.D.   On: 09/16/2014 11:20    ASSESSMENT AND PLAN   1. ATRIAL FIB WITH RVR:  - Continue amiodarone. Rate in 100s. BP improved, will increase coreg to 6.25gm BID. Restarted Apixaben 2.61m BID yesterday, appreciate pharmacist input, needs close monitoring of renal function. If worsen likely to switch to  Warfarin.   2. HYPOTENSION: As above  3. CKD: Creat seems stable. Renal is following.  Signed, Bhagat,Bhavinkumar PA-C Pager [phone] I have examined the patient and reviewed assessment and plan and discussed with patient.  Agree with above as stated.  Continue apixaban at this time.  Watch renal function, even when she goes to rehab. Agree with increasing Coreg.  Asma Boldon S.

## 2014-09-26 NOTE — Progress Notes (Signed)
Rehab admissions - Noted cardiology plans for possible discharge to inpatient rehab tomorrow.  I will check back in am for bed availability and medical readiness.  Call me for questions.  #[phone]

## 2014-09-26 NOTE — Progress Notes (Signed)
Family Medicine Teaching Service Daily Progress Note Intern Pager: [phone]  Patient name: Sara Russell Medical record number: [id number] Date of birth: [birth year] Age: 81 y.o. Gender: female  Primary Care Provider: Zigmund Gottron, MD Consultants: Nephrology, Palliative Care, Cardiology   Code Status: DNR   Pt Overview and Major Events to Date:  - Admitted 8/4 for SOB  and Dizziness  - 8/5 Acute decompensation with Acute Respiratory Distress, Hypotension, Atrial fibrillation with RVR  - 8/5 Transferred to CCM  - 8/5 Continued Hypotension and Afib with RVR , Diltiazem drip started/stopped - started Amiodarone drip  - 8/5  Critical Care started patient on Phenylephrine drip for blood pressures   - 8/5 ECHO completed 8/5  with EF 45-50% with septal hypokinesis, mild to moderate RV dilation with mildly decreased systolic function, mild to moderate pulmonary hypertension - 8/6 hypothermic with rigors 8/7 CT ab/pel > heterogenous masslike abnormality along posterior liver, large, unclear if mass or abscess, smsll bilat effusions, distended gallbladder, chronic adnexal cyst - 8/11 Patient off Phenylphrine drip and Amiodarone drip, transferred to PO Amiodarone  - 8/11 Fecal Occult Blood test positive x 2, Hgb/Hct Stable Will follow up as needed as out-patient.  - 8/12 Transferred from CCM back to Family Medicine   Assessment and Plan: Sara Russell is a 81 y.o. female presenting with SOB, fatigue and dizziness. PMH is significant for afib, PE (currently on eliquis), HTN, HLD, diverticulosis, GERD, esophageal stricture s/p dilatation, anemia.   # Chronic A. fib with RVR. 8/5 ECHO EF 45-50% and mild to moderate pulmonary hypertension. 8/5-8/6 troponins trended between 0.25 -0.17, likely due to demand ischemia  - transfer to tele floor - HR 138 - 101 overnight  - Continue carvedilol 3.125 mg BID-> will increase COREG today, watch blood pressure  - Continue Amiodarone 200 mg PO -  Continue Metoprolol 2.5 mg PRN for HR> 120  - Continue Eliquis (2.5 mg BID)  per pharmacy- if SCr continues to rise will need to consider Coumadin   # Acute oliguric renal failure -  On Admission SCr 2.25  - baseline 1. Patient requiring pressors from 8/5- 8/11. On 8/8 patient with oliguria at 200 cc over 24hrs. UA on 8/9 with protein and rare hgb. FENa of 1.48 which indicates either prerenal or ATN.  On 8/11 renal ultrasound benign. Scr trending down now 4.09>4.31>4.46> 4.34 > 4.37 - SCr now trending down to SCr 4.37  - Output of 2.1 Liters today  - Nephrology following  - Continue daily BMP  - lasix IV  80 BID  - Continue strict I&Os    # Mass btw intrahepatic IVC and right adrenal gland. Primary differential considerations include pheochromocytoma, sarcoma, or possibly metastasis.Patient does not want biopsy because she would not want surgery or chemotherapy - Consulted Palliative -Patient interested in hospice, continue life maintain treatment  - Urine Catacholamines ordered, however note pt was recently on phenylephrine for pressor support   # Mild Normocytic Anemia. Hx of Anemia. Normal range hgb 12.0-14. Patient with long standing anemia on iron at home.  -Hgb 10.8 < 12.1 < 11 < 11, remains stable  - Daily CBC  - Continue to monitor   # Hyponatremia. On admission Na 128. Likely due to Acute renal failure/chronic. Stable currently. - Na 134 this AM  - Continue to monitor   # Hypokalemia: 3.3 - replete 40 mEq kdur daily    # Hypotension. Resolved. BP 131/58. Off pressors at 1 AM on 8/11.  -  Continue Monitor   # Resolved Septic Shock. On admission started on ceftriaxone for possible uti( UA + mod LE but only3-6 wbc).  CXR negative for eumonia. Decompensation over 24-36hr, with high fevers, rigors, tachycardia with afib with rvr, and hypotension. Broaden to vanco/piptazo and  transferred to the icu. Patient continued to have hypotension, fevers, rigors, leukocytosis. Ct abd/pelvis  for possible colitis with positive finding for a malignancy adjacent to the IVC causing a transient bacteremia. BCX 8/4 and 8/5 and UCx NGTD  - Antibiotics Vancomycin 8/5>>>8/9, Zosyn 8/5>>>8/7, Aztreonam 8/7>>>8/10, Meropenem 8/10>>>8/11  - Continue Protonix daily    # Acute hypoxic respiratory failure - resolved. Currently on 2 L [place]. Saturations 95 %.  RR 20. CXR on 8/10 no acute process.  At risk for volume overload - Monitor respiratory status with IVF resuscitation - Continue to encourage incentive spirometry  - Titrate O2 for sat of 88-92%.  # Hx of PE/DVT: On Eliquis and reports compliance, doubt recurrent clot as cause of current symptoms - Continue Renally dosed Eliquis  # HLD: Last lipid panel in 07/2013 with LDL 30 - Continue home atorvastatin  # FEN/GI: - Heart Diet  - home PPI   Prophylaxis: None  Disposition:transfer to tele, anticipate CIR  Subjective:  Patient continues to improve. Denies any dyspnea this  Objective: Temp:  [97.5 F (36.4 C)-100 F (37.8 C)] 97.5 F (36.4 C) (08/15 0542) Pulse Rate:  [96-118] 101 (08/15 0542) Resp:  [18-19] 19 (08/15 0542) BP: (116-131)/(56-101) 118/61 mmHg (08/15 0542) SpO2:  [95 %-98 %] 97 % (08/15 0542) Weight:  [165 lb 14.4 oz (75.252 kg)] 165 lb 14.4 oz (75.252 kg) (08/15 0542)  Physical Exam: General: NAD, patient laying in bed, smiling  Cardiovascular: irregular rhythm,, radial pulses 2+ Respiratory: overall clear breath sounds, normal effort.  2 Liters [place]  In place  Abdomen: BS+, NTND  Extremities: SCDs in place, No LE edema.  Laboratory:  Recent Labs Lab 09/24/14 0235 09/25/14 0305 09/26/14 0323  WBC 7.8 7.6 10.7*  HGB 12.2 11.0* 11.1*  HCT 34.7* 31.5* 32.3*  PLT PLATELET CLUMPS NOTED ON SMEAR, COUNT APPEARS DECREASED 109* 123*    Recent Labs Lab 09/24/14 0235 09/25/14 0305 09/26/14 0323  NA 133* 133* 134*  K 3.3* 3.3* 3.3*  CL 97* 96* 96*  CO2 21* 25 24  BUN 55* 55* 58*  CREATININE 4.46*  4.34* 4.37*  CALCIUM 7.7* 7.7* 7.8*  GLUCOSE 96 91 92    Imaging/Diagnostic Tests:  Ct Abdomen Pelvis Wo Contrast  09/18/2014   CLINICAL DATA:  SepsisHx basal and squamous cell ca., diverticulosis, GERDSurgery's; D&C, tonsillectomy, appendectomy, breast lumpectomy(benign)  EXAM: CT ABDOMEN AND PELVIS WITHOUT CONTRAST  TECHNIQUE: Multidetector CT imaging of the abdomen and pelvis was performed following the standard protocol without IV contrast.  COMPARISON:  Pelvic ultrasound, 02/27/2012.  FINDINGS: Lung bases: Small pleural effusions. Lower lobe opacity most consistent with atelectasis. Component of pneumonia is possible but felt likely. 5 mm nodule right lower lobe near the oblique fissure. No pulmonary edema. Heart mildly enlarged. Moderate size hiatal hernia.  Liver: 4.5 cm abnormality, which may reflect a mass, which lies along the posterior margin of the liver at the base caudate lobe and is inseparable from the the inferior vena cava. It also abuts the right adrenal gland. Longitudinal extent of this abnormality is approximately 6 cm.  Liver otherwise unremarkable.  Spleen: Unremarkable.  Gallbladder: Distended. No radiopaque stone. No wall thickening or adjacent inflammation. Common bile duct is dilated  to approximate 1 cm but shows normal distal tapering.  Adrenal glands. The left adrenal gland thickening consistent with nodular hyperplasia. Right adrenal gland abuts the above described lesion. Is otherwise unremarkable.  Kidneys, ureters, bladder: 3.2 cm low-density lesion in the midpole of the left kidney consistent with cysts. No other renal masses. Bilateral renal cortical thinning. No stones. Bilateral perinephric stranding, likely chronic. Left ureter is dilated. There is mild prominence of the left renal pelvis without calyx dilation. No ureteral stone. Normal right ureter. Bladder is decompressed by a Foley catheter.  Uterus and adnexa: Uterus is unremarkable. There is a left adnexal water  density mass measuring 6.7 x 6.1 x 7.5 cm. This was present on the prior ultrasound and is unchanged in overall size. This could be ovarian origin. It may reflect an inclusion cyst. It stability since the 2014 ultrasound strongly supports a benign etiology.  Lymph nodes:  No enlarged lymph nodes.  Ascites:  None.  Gastrointestinal: No bowel dilation to suggest obstruction. No bowel wall thickening or mesenteric inflammation.  Musculoskeletal: Degenerative changes most evident at L2-L3. Bony structures are demineralized. No osteoblastic or osteolytic lesions.  IMPRESSION: 1. Heterogeneous masslike abnormality along the posterior margin of the liver, centered on the inferior vena cava, from which it is inseparable. It abuts the right adrenal gland. This may have an origin from the posterior margin of the liver or from the inferior vena cava. Adrenal origin is felt unlikely. This could be a neoplastic mass. This would be better evaluated with a contrast-enhanced CT scan or, if the patient can tolerate, MRI with and without contrast. 2. Small bilateral pleural effusions.  Lower lobe atelectasis. 3. Distended gallbladder without convincing acute cholecystitis. 4. No abdominal abscess. 5. Chronic left adnexal cyst measuring 7.5 cm in greatest dimension, which may be ovarian in origin or an inclusion cyst. It is similar in size to the ultrasound dated 02/27/2012. 6. No acute bowel abnormality.   Electronically Signed   By: Lajean Manes M.D.   On: 09/18/2014 12:15   Dg Chest 2 View  09/15/2014   CLINICAL DATA:  Shortness of breath. Pain when breathing for 2 days.  EXAM: CHEST  2 VIEW  COMPARISON:  03/20/2014  FINDINGS: Heart size is mildly enlarged. Moderate hiatal hernia present. There are no focal consolidations. No pleural effusions. No pulmonary edema. Mild scoliosis.  IMPRESSION: 1. Cardiomegaly without edema. 2. Hiatal hernia.   Electronically Signed   By: Nolon Nations M.D.   On: 09/15/2014 11:31   Mr Liver  Wo Contrast  09/21/2014   CLINICAL DATA:  Liver mass on CT  EXAM: MRI ABDOMEN WITHOUT CONTRAST  TECHNIQUE: Multiplanar multisequence MR imaging was performed without the administration of intravenous contrast.  COMPARISON:  CT abdomen pelvis dated 09/18/2014  FINDINGS: Severely motion degraded images.  Lower chest: Small bilateral pleural effusions. Associated bilateral lower lobe opacities, likely atelectasis.  Hepatobiliary: Liver is grossly unremarkable. Specifically, no mass is seen. The CT abnormality is distinct from the right liver.  Layering gallstones versus gallbladder sludge (series 5/ image 23). No associated inflammatory changes.  No intrahepatic ductal dilatation. Common duct is mildly dilated with a suspected 5 mm distal CBD stone (series 5/ image 29).  Pancreas: Pancreatic atrophy. Pancreas is poorly evaluated due to motion degradation.  Spleen: Within normal limits.  Adrenals/Urinary Tract: Thickening of the left adrenal gland without discrete mass (series 5/image 26).  Right adrenal gland is incompletely visualized but mildly thickened (series 5/image 23).  2.0 x 2.5  cm well-circumscribed T2 hyperintense mass abutting the anterior aspect of the right adrenal gland and displacing the IVC anteriorly/laterally (series 5/image 20). It is unclear whether the mass is of primary adrenal origin or possibly originates along the posterior wall of the infrahepatic IVC. The lesion is new from 2011 but is poorly characterized on this motion degraded unenhanced MRI. Primary differential considerations include pheochromocytoma or sarcoma. If this is adrenal in etiology, metastasis would also be in the differential in the setting of a known primary neoplasm.  3.0 cm left lower pole renal cyst (series 11/ image 33). Additional subcentimeter right lower pole renal cyst (series 11/ image 34). Bilateral renal scarring. No hydronephrosis.  Stomach/Bowel: Stomach is notable for a moderate to large hiatal hernia.   Visualized bowel is poorly evaluated/ grossly unremarkable.  Vascular/Lymphatic: No evidence of abdominal aortic aneurysm.  Infrahepatic IVC is effaced/displaced anteriorly, as described above.  No gross abdominal lymphadenopathy.  Other: No abdominal ascites.  Retroperitoneal standing.  Moderate body wall edema.  Musculoskeletal: Degenerative changes of the visualized thoracolumbar spine.  IMPRESSION: Severely motion degraded images. Evaluation is also limited due to lack of intravenous contrast administration.  2.5 cm well-circumscribed T2 hyperintense mass located between the intrahepatic IVC and right adrenal gland, as described above, exact organ of origin unclear. Primary differential considerations include pheochromocytoma, sarcoma, or possibly metastasis.  Liver is grossly unremarkable.  Additional ancillary findings as above.   Electronically Signed   By: Julian Hy M.D.   On: 09/21/2014 08:20   US Renal  09/22/2014   CLINICAL DATA:  Acute renal failure  EXAM: RENAL / URINARY TRACT ULTRASOUND COMPLETE  COMPARISON:  09/18/2014  FINDINGS: Right Kidney:  Length: 10.8 cm. Echogenicity within normal limits. No mass or hydronephrosis visualized.  Left Kidney:  Length: 11.6 cm. A 2.9 cm cyst is noted in the upper pole similar to that seen on prior CT examination.  Bladder:  Decompressed by Foley catheter. Note is made again of a left adnexal cyst.  IMPRESSION: Left renal cyst.  No other focal renal abnormality is noted.   Electronically Signed   By: Inez Catalina M.D.   On: 09/22/2014 11:00   Dg Chest Port 1 View  09/21/2014   CLINICAL DATA:  Acute renal insufficiency, pulmonary embolism and infarction, atrial fibrillation, CHF.  EXAM: PORTABLE CHEST - 1 VIEW  COMPARISON:  Portable chest x-ray of September 17, 2014 and PA and lateral chest x-ray of August 05, 2013  FINDINGS: The lungs are mildly hypoinflated greater on the right than on the left. The cardiac silhouette is enlarged. The pulmonary vascularity  is not engorged. A trace of pleural fluid may be present on the right. There is no alveolar infiltrate. There is density in the retrocardiac region which likely reflects a hiatal hernia given the appearance on the previous studies. The observed bony thorax is unremarkable.  IMPRESSION: There is mild hypoinflation greater on the right than on the left. There is no acute cardiopulmonary abnormality.   Electronically Signed   By: David  Martinique M.D.   On: 09/21/2014 07:22   Dg Chest Port 1 View  09/17/2014   CLINICAL DATA:  Respiratory distress.  EXAM: PORTABLE CHEST - 1 VIEW  COMPARISON:  09/16/2014.  FINDINGS: Cardiac silhouette is mildly enlarged. Opacity in the retrocardiac region is likely moderate hiatal hernia. No hilar masses or adenopathy.  No lung consolidation or edema. No pleural effusion or pneumothorax.  Bony thorax is demineralized but grossly intact.  No significant change from  the previous day's study.  IMPRESSION: No acute cardiopulmonary disease.   Electronically Signed   By: Lajean Manes M.D.   On: 09/17/2014 19:47   Dg Chest Port 1 View  09/16/2014   CLINICAL DATA:  Shortness of Breath  EXAM: PORTABLE CHEST - 1 VIEW  COMPARISON:  September 15, 2014  FINDINGS: There is no edema or consolidation. Heart is upper normal in size with pulmonary vascularity within normal limits. Moderate hiatal hernia is present. There is atherosclerotic change in the aorta. No adenopathy. No bone lesions.  IMPRESSION: No edema or consolidation. Moderate hiatal hernia. Atherosclerotic calcification in aorta.   Electronically Signed   By: Lowella Grip III M.D.   On: 09/16/2014 11:20    Fantasy Donald Cletis Media, MD 09/26/2014, 8:19 AM PGY-1, [place] Intern pager: [phone], text pages welcome

## 2014-09-26 NOTE — Progress Notes (Signed)
Physical Therapy Treatment Patient Details Name: NAFEESA DILS MRN: [id number] DOB: 1931-01-03 Today's Date: 09/26/2014    History of Present Illness Admitted 8/4 with 1 day history of shortness of breath on exertion. Known history of atrial fibrillation on systemic and coagulation with Eliquis. Also has a known history of PE/DVT in 2009. Denies any infectious symptoms or complaints other than dyspnea    PT Comments    Patient found seated in recliner, agreeable to participate in PT today. Patient was able to ambulate and transfer as described below. Patient reports feeling much better today and with more energy, no c/o SOB during ambulation or transfers. She reports compliance with exercise program over the weekend. Patient will benefit from continued PT to improve ambulation endurance and transfer independence.   Follow Up Recommendations  CIR;Supervision - Intermittent     Equipment Recommendations  Other (comment) (TBA)    Recommendations for Other Services       Precautions / Restrictions Precautions Precautions: Fall Restrictions Weight Bearing Restrictions: No    Mobility  Bed Mobility               General bed mobility comments: Patient found seated in recliner.  Transfers Overall transfer level: Needs assistance Equipment used: Rolling walker (2 wheeled) Transfers: Sit to/from Stand Sit to Stand: Mod assist;Min assist         General transfer comment: Patient required less assistance during second transfer.   Ambulation/Gait Ambulation/Gait assistance: Min assist;+2 safety/equipment (Follow with chair.) Ambulation Distance (Feet): 12 Feet Assistive device: Rolling walker (2 wheeled) Gait Pattern/deviations: Step-through pattern;Shuffle   Gait velocity interpretation: <1.8 ft/sec, indicative of risk for recurrent falls General Gait Details: Patient ambulated a few feet with RW and min A x1. She fatigued quickly.   Stairs             Wheelchair Mobility    Modified Rankin (Stroke Patients Only)       Balance Overall balance assessment: Needs assistance         Standing balance support: Bilateral upper extremity supported Standing balance-Leahy Scale: Fair Standing balance comment: Patient able to stand with B UE assist and RW to apply lotion to dry skin on low back and buttocks.                    Cognition Arousal/Alertness: Awake/alert Behavior During Therapy: WFL for tasks assessed/performed Overall Cognitive Status: Within Functional Limits for tasks assessed                      Exercises      General Comments        Pertinent Vitals/Pain Pain Assessment: No/denies pain    Home Living                      Prior Function            PT Goals (current goals can now be found in the care plan section) Acute Rehab PT Goals Patient Stated Goal: Go home PT Goal Formulation: With patient Time For Goal Achievement: 10/05/14 Potential to Achieve Goals: Good Progress towards PT goals: Progressing toward goals    Frequency  Min 3X/week    PT Plan Current plan remains appropriate    Co-evaluation             End of Session Equipment Utilized During Treatment: Gait belt Activity Tolerance: Patient limited by fatigue Patient left: in chair;with call bell/phone within reach;with  chair alarm set     Time: [phone] PT Time Calculation (min) (ACUTE ONLY): 26 min  Charges:  $Gait Training: 8-22 mins $Therapeutic Activity: 8-22 mins                    G CodesRoanna Epley, SPT [phone]  09/26/2014, 3:39 PM  I have read, reviewed and agree with student's note.   [place] [phone] (pager)

## 2014-09-26 NOTE — Progress Notes (Signed)
Assessment:  1. ARF sec ischemic ATN in face of ACE, UO good, Irene sl lower 2. Mass between Rt adrenal gland and IVC 3. Chronic A fib 4. Met acidosis, improved 5. Mild hypokalemia  Plan: 1. Replace K 2. Daily labs 3. Cont lasix for now for volume  Subjective: Interval History: good uop  Objective: Vital signs in last 24 hours: Temp:  [97.5 F (36.4 C)-100 F (37.8 C)] 98.2 F (36.8 C) (08/15 0949) Pulse Rate:  [96-118] 97 (08/15 0949) Resp:  [18-19] 18 (08/15 0949) BP: (105-131)/(43-101) 105/43 mmHg (08/15 0949) SpO2:  [95 %-98 %] 95 % (08/15 0949) Weight:  [75.252 kg (165 lb 14.4 oz)] 75.252 kg (165 lb 14.4 oz) (08/15 0542) Weight change: -1.678 kg (-3 lb 11.2 oz)  Intake/Output from previous day: 08/14 0701 - 08/15 0700 In: 480 [P.O.:480] Out: 2175 [Urine:2175] Intake/Output this shift: Total I/O In: 460 [P.O.:460] Out: 150 [Urine:150]  General appearance: alert and cooperative Resp: clear to auscultation bilaterally Chest wall: no tenderness Cardio: regular rate and rhythm, S1, S2 normal, no murmur, click, rub or gallop GI: soft, non-tender; bowel sounds normal; no masses,  no organomegaly Extremities: edema 1+  Lab Results:  Recent Labs  09/25/14 0305 09/26/14 0323  WBC 7.6 10.7*  HGB 11.0* 11.1*  HCT 31.5* 32.3*  PLT 109* 123*   BMET:  Recent Labs  09/25/14 0305 09/26/14 0323  NA 133* 134*  K 3.3* 3.3*  CL 96* 96*  CO2 25 24  GLUCOSE 91 92  BUN 55* 58*  CREATININE 4.34* 4.37*  CALCIUM 7.7* 7.8*   No results for input(s): PTH in the last 72 hours. Iron Studies: No results for input(s): IRON, TIBC, TRANSFERRIN, FERRITIN in the last 72 hours. Studies/Results: No results found.  Scheduled: . amiodarone  200 mg Oral Daily  . apixaban  2.5 mg Oral BID  . atorvastatin  10 mg Oral q1800  . carvedilol  6.25 mg Oral BID WC  . feeding supplement (NEPRO CARB STEADY)  237 mL Oral TID BM  . furosemide  80 mg Intravenous BID  . pantoprazole  40 mg  Oral Daily  . [START ON 09/27/2014] potassium chloride  60 mEq Oral Daily     LOS: 11 days   Jaleea Alesi C 09/26/2014,1:29 PM

## 2014-09-27 ENCOUNTER — Encounter (HOSPITAL_COMMUNITY): Payer: Self-pay | Admitting: *Deleted

## 2014-09-27 ENCOUNTER — Inpatient Hospital Stay (HOSPITAL_COMMUNITY)
Admission: RE | Admit: 2014-09-27 | Discharge: 2014-10-07 | DRG: 948 | Disposition: A | Discharge status: Home Health | Payer: Medicare Other | Source: Intra-hospital | Attending: Physical Medicine & Rehabilitation | Admitting: Physical Medicine & Rehabilitation

## 2014-09-27 DIAGNOSIS — I5022 Chronic systolic (congestive) heart failure: Secondary | ICD-10-CM | POA: Diagnosis present

## 2014-09-27 DIAGNOSIS — M1990 Unspecified osteoarthritis, unspecified site: Secondary | ICD-10-CM | POA: Diagnosis present

## 2014-09-27 DIAGNOSIS — I4891 Unspecified atrial fibrillation: Secondary | ICD-10-CM | POA: Diagnosis not present

## 2014-09-27 DIAGNOSIS — N179 Acute kidney failure, unspecified: Secondary | ICD-10-CM | POA: Diagnosis not present

## 2014-09-27 DIAGNOSIS — I10 Essential (primary) hypertension: Secondary | ICD-10-CM | POA: Diagnosis present

## 2014-09-27 DIAGNOSIS — E785 Hyperlipidemia, unspecified: Secondary | ICD-10-CM | POA: Diagnosis present

## 2014-09-27 DIAGNOSIS — Z91041 Radiographic dye allergy status: Secondary | ICD-10-CM | POA: Diagnosis not present

## 2014-09-27 DIAGNOSIS — Z87891 Personal history of nicotine dependence: Secondary | ICD-10-CM

## 2014-09-27 DIAGNOSIS — K219 Gastro-esophageal reflux disease without esophagitis: Secondary | ICD-10-CM | POA: Diagnosis present

## 2014-09-27 DIAGNOSIS — D500 Iron deficiency anemia secondary to blood loss (chronic): Secondary | ICD-10-CM | POA: Diagnosis present

## 2014-09-27 DIAGNOSIS — I482 Chronic atrial fibrillation: Secondary | ICD-10-CM | POA: Diagnosis not present

## 2014-09-27 DIAGNOSIS — Z91013 Allergy to seafood: Secondary | ICD-10-CM

## 2014-09-27 DIAGNOSIS — R531 Weakness: Principal | ICD-10-CM | POA: Diagnosis present

## 2014-09-27 DIAGNOSIS — I480 Paroxysmal atrial fibrillation: Secondary | ICD-10-CM | POA: Diagnosis not present

## 2014-09-27 DIAGNOSIS — R5381 Other malaise: Secondary | ICD-10-CM | POA: Diagnosis not present

## 2014-09-27 DIAGNOSIS — I481 Persistent atrial fibrillation: Secondary | ICD-10-CM

## 2014-09-27 DIAGNOSIS — Z882 Allergy status to sulfonamides status: Secondary | ICD-10-CM

## 2014-09-27 DIAGNOSIS — E872 Acidosis: Secondary | ICD-10-CM | POA: Diagnosis not present

## 2014-09-27 DIAGNOSIS — A419 Sepsis, unspecified organism: Secondary | ICD-10-CM

## 2014-09-27 DIAGNOSIS — R160 Hepatomegaly, not elsewhere classified: Secondary | ICD-10-CM | POA: Diagnosis present

## 2014-09-27 DIAGNOSIS — N170 Acute kidney failure with tubular necrosis: Secondary | ICD-10-CM | POA: Diagnosis not present

## 2014-09-27 DIAGNOSIS — R6521 Severe sepsis with septic shock: Secondary | ICD-10-CM

## 2014-09-27 DIAGNOSIS — I5033 Acute on chronic diastolic (congestive) heart failure: Secondary | ICD-10-CM | POA: Diagnosis not present

## 2014-09-27 DIAGNOSIS — Z79899 Other long term (current) drug therapy: Secondary | ICD-10-CM | POA: Diagnosis not present

## 2014-09-27 DIAGNOSIS — I5032 Chronic diastolic (congestive) heart failure: Secondary | ICD-10-CM

## 2014-09-27 LAB — BASIC METABOLIC PANEL
Anion gap: 11 (ref 5–15)
BUN: 61 mg/dL — ABNORMAL HIGH (ref 6–20)
CO2: 27 mmol/L (ref 22–32)
Calcium: 7.8 mg/dL — ABNORMAL LOW (ref 8.9–10.3)
Chloride: 95 mmol/L — ABNORMAL LOW (ref 101–111)
Creatinine, Ser: 4.45 mg/dL — ABNORMAL HIGH (ref 0.44–1.00)
GFR calc Af Amer: 10 mL/min — ABNORMAL LOW (ref >60)
GFR calc non Af Amer: 8 mL/min — ABNORMAL LOW (ref >60)
Glucose, Bld: 94 mg/dL (ref 65–99)
Potassium: 4.1 mmol/L (ref 3.5–5.1)
Sodium: 133 mmol/L — ABNORMAL LOW (ref 135–145)

## 2014-09-27 LAB — CBC
HCT: 30.8 % — ABNORMAL LOW (ref 36.0–46.0)
Hemoglobin: 10.4 g/dL — ABNORMAL LOW (ref 12.0–15.0)
MCH: 29 pg (ref 26.0–34.0)
MCHC: 33.8 g/dL (ref 30.0–36.0)
MCV: 85.8 fL (ref 78.0–100.0)
Platelets: 134 10*3/uL — ABNORMAL LOW (ref 150–400)
RBC: 3.59 MIL/uL — ABNORMAL LOW (ref 3.87–5.11)
RDW: 14.5 % (ref 11.5–15.5)
WBC: 6.5 10*3/uL (ref 4.0–10.5)

## 2014-09-27 MED ORDER — ALUM & MAG HYDROXIDE-SIMETH 200-200-20 MG/5ML PO SUSP: 30.0000 mL | ORAL | Status: DC | PRN

## 2014-09-27 MED ORDER — AMIODARONE HCL 200 MG PO TABS: 200.0000 mg | ORAL_TABLET | Freq: Every day | ORAL | Status: DC

## 2014-09-27 MED ORDER — POTASSIUM CHLORIDE CRYS ER 20 MEQ PO TBCR: 30.0000 meq | EXTENDED_RELEASE_TABLET | Freq: Every day | ORAL | Status: DC

## 2014-09-27 MED ORDER — CARVEDILOL 6.25 MG PO TABS: 6.2500 mg | ORAL_TABLET | Freq: Two times a day (BID) | ORAL | Status: DC

## 2014-09-27 MED ORDER — POTASSIUM CHLORIDE CRYS ER 20 MEQ PO TBCR: 40.0000 meq | EXTENDED_RELEASE_TABLET | Freq: Every day | ORAL | Status: DC

## 2014-09-27 MED ORDER — TRIAMCINOLONE ACETONIDE 0.1 % EX CREA
TOPICAL_CREAM | Freq: Two times a day (BID) | CUTANEOUS | Status: DC
  Administered 2014-09-27 – 2014-10-01 (×9): via TOPICAL
  Administered 2014-10-02: 1 via TOPICAL
  Administered 2014-10-02 – 2014-10-06 (×7): via TOPICAL
  Filled 2014-09-27: qty 15

## 2014-09-27 MED ORDER — FUROSEMIDE 40 MG PO TABS: 40.0000 mg | ORAL_TABLET | Freq: Two times a day (BID) | ORAL | Status: DC

## 2014-09-27 MED ORDER — NEPRO/CARBSTEADY PO LIQD: 237.0000 mL | Freq: Three times a day (TID) | ORAL | Status: DC

## 2014-09-27 MED ORDER — ONDANSETRON HCL 4 MG/2ML IJ SOLN: 4.0000 mg | Freq: Four times a day (QID) | INTRAMUSCULAR | Status: DC | PRN

## 2014-09-27 MED ORDER — FUROSEMIDE 80 MG PO TABS
80.0000 mg | ORAL_TABLET | Freq: Two times a day (BID) | ORAL | Status: DC
Start: 2014-09-27 — End: 2014-09-27
  Administered 2014-09-27: 80 mg via ORAL
  Filled 2014-09-27: qty 1

## 2014-09-27 MED ORDER — FLEET ENEMA 7-19 GM/118ML RE ENEM: 1.0000 | ENEMA | Freq: Once | RECTAL | Status: DC | PRN

## 2014-09-27 MED ORDER — FUROSEMIDE 80 MG PO TABS: 80.0000 mg | ORAL_TABLET | Freq: Two times a day (BID) | ORAL | Status: DC

## 2014-09-27 MED ORDER — ONDANSETRON HCL 4 MG PO TABS: 4.0000 mg | ORAL_TABLET | Freq: Four times a day (QID) | ORAL | Status: DC | PRN

## 2014-09-27 MED ORDER — APIXABAN 2.5 MG PO TABS
2.5000 mg | ORAL_TABLET | Freq: Two times a day (BID) | ORAL | Status: DC
  Administered 2014-09-27 – 2014-10-07 (×20): 2.5 mg via ORAL
  Filled 2014-09-27 (×21): qty 1

## 2014-09-27 MED ORDER — ACETAMINOPHEN 325 MG PO TABS: 325.0000 mg | ORAL_TABLET | ORAL | Status: DC | PRN

## 2014-09-27 MED ORDER — APIXABAN 2.5 MG PO TABS: 2.5000 mg | ORAL_TABLET | Freq: Two times a day (BID) | ORAL | Status: DC

## 2014-09-27 MED ORDER — CAMPHOR-MENTHOL 0.5-0.5 % EX LOTN
TOPICAL_LOTION | CUTANEOUS | Status: DC | PRN
  Administered 2014-10-04: 22:00:00 via TOPICAL
  Filled 2014-09-27: qty 222

## 2014-09-27 MED ORDER — BISACODYL 10 MG RE SUPP: 10.0000 mg | Freq: Every day | RECTAL | Status: DC | PRN

## 2014-09-27 MED ORDER — GUAIFENESIN-DM 100-10 MG/5ML PO SYRP: 5.0000 mL | ORAL_SOLUTION | Freq: Four times a day (QID) | ORAL | Status: DC | PRN

## 2014-09-27 NOTE — Progress Notes (Signed)
Retta Diones, RN Rehab Admission Coordinator Signed Physical Medicine and Rehabilitation PMR Pre-admission 09/27/2014 9:51 AM  Related encounter: ED to Hosp-Admission (Current) from 09/15/2014 in [facility]    Expand All Collapse All   PMR Admission Coordinator Pre-Admission Assessment  Patient: Sara Russell is an 81 y.o., female MRN: [id number] DOB: December 23, 1930 Height: 5' 1"  (154.9 cm) Weight: 71.804 kg (158 lb 4.8 oz) (b scale)  Insurance Information HMO: No PPO: PCP: IPA: 80/20: OTHER:  PRIMARY: Medicare A/B Policy#: [id number] Subscriber: Antonieta Loveless CM Name: Phone#: Fax#:  Pre-Cert#: Employer: Retired Benefits: Phone #: Name: Checked in [place]. Date: 08/11/97 Deduct: $1288 Out of Pocket Max: none Life Max: unlimited CIR: 100% SNF: 100 days Outpatient: 80% Co-Pay: 20% Home Health: 100% Co-Pay: none DME: 80% Co-Pay: 20% Providers: patient's choice  SECONDARY: GHI Policy#: [id number] Subscriber: Antonieta Loveless CM Name: Phone#: Fax#:  Pre-Cert#: Employer: Retired Benefits: Phone #: [phone] Name:  Eff. Date: Deduct: Out of Pocket Max: Life Max:  CIR: SNF:  Outpatient: Co-Pay:  Home Health: Co-Pay:  DME: Co-Pay:   Emergency Contact Information Contact Information    Name Relation Home Work [place] Son [phone]  [phone]   Trana, Ressler   [phone]   Zeba, Luby   [phone]     Current Medical History  Patient Admitting Diagnosis:Deconditioning related to sepsis with acute renal failure   History of Present Illness: A 81 y.o. right handed female  with history of hypertension, atrial fibrillation, pulmonary emboli/DVT maintained on Eliquis, systolic congestive heart failure. Independent with a walker prior to admission living alone. Patient had just signed contracts to enter [facility] independent living facility. Presented 09/15/2014 with increasing shortness of breath and dizziness on exertion. Denied chest pain or fever. Chest x-ray noted cardiomegaly without effusion or pulmonary edema. Creatinine elevated 2.25 as well as hyponatremia 128. White blood cell count 12,100. Troponin mildly elevated 0.28. Urinalysis negative. Cardiology service was consulted for elevated troponins with echocardiogram showing ejection fraction of 50% as well as septal hypokinesis. Serial follow-up troponins placed on amiodarone for cardiac rate control. Patient spiked a fever 104.7 rectally and increasing respiratory distress 09/16/2014 with rapid response contacted and placed on BiPAP.. Maintained on broad-spectrum anti-biotics. Nephrology services consulted in light of elevated creatinine as he continued to elevate with latest creatinine 4.31 suspect acute renal failure secondary to ischemic ATN from bouts of hypotension as well as ACE inhibitor that was discontinued. CT abdomen and pelvis showed questionable liver abscess versus mass. MRI of the liver shows a 2.5 cm well circumscribed hyperintense mass located between the intrahepatic IVC and right adrenal gland. Primary differential considerations included pheochromocytoma, sarcoma or possibly metastasis with workup ongoing. Infectious disease consulted suspect sepsis versus transient bacteremia and currently maintained on intravenous Merrem and later discontinued all anti-biotics with close observation. Physical therapy evaluation completed 09/21/2014 with noted deconditioning and recommendations of physical medicine rehabilitation consult. Palliative care was consulted 09/22/2014 to lessen burden of care. Patient to be  admitted for comprehensive inpatient rehabilitation program.   Past Medical History  Past Medical History  Diagnosis Date  . S/P dilatation of esophageal stricture 2002  . Hypertension   . Hyperlipidemia   . DVT (deep venous thrombosis) 2009  . PE (pulmonary embolism) 2009  . GERD (gastroesophageal reflux disease)   . Diverticulosis   . Iron deficiency anemia   . Hyperplastic colon polyp   . Positive H. pylori test 01/20/96  . Degenerative joint disease   .  Skin cancer     h/o basal and squamous skin cancer--> removed  . Persistent atrial fibrillation   . Shortness of breath dyspnea   . AKI (acute kidney injury) 09/15/2014  . Colitis, ulcerative     Family History  family history includes Diverticulitis in her brother; Esophageal cancer in her brother; Heart disease in her mother; Stroke in her mother. There is no history of Colon cancer.  Prior Rehab/Hospitalizations:  Has the patient had major surgery during 100 days prior to admission? No  Current Medications   Current facility-administered medications:  . acetaminophen (TYLENOL) tablet 650 mg, 650 mg, Oral, Q6H PRN, 650 mg at 09/25/14 2056 **OR** acetaminophen (TYLENOL) suppository 650 mg, 650 mg, Rectal, Q6H PRN, Asiyah Cletis Media, MD, 650 mg at 09/18/14 1624 . amiodarone (PACERONE) tablet 200 mg, 200 mg, Oral, Daily, Darlin Coco, MD, 200 mg at 09/27/14 0939 . apixaban (ELIQUIS) tablet 2.5 mg, 2.5 mg, Oral, BID, Alveda Reasons, MD, 2.5 mg at 09/27/14 0939 . atorvastatin (LIPITOR) tablet 10 mg, 10 mg, Oral, q1800, Asiyah Cletis Media, MD, 10 mg at 09/26/14 1713 . carvedilol (COREG) tablet 6.25 mg, 6.25 mg, Oral, BID WC, Bhavinkumar Bhagat, PA, 6.25 mg at 09/27/14 0827 . feeding supplement (NEPRO CARB STEADY) liquid 237 mL, 237 mL, Oral, TID BM, Fleet Contras, MD, 237 mL at 09/27/14 1000 . furosemide (LASIX) tablet 80 mg, 80 mg, Oral, BID, Zenia Resides, MD, 80 mg at 09/27/14 4540 . loperamide (IMODIUM) 1 MG/5ML solution 2 mg, 2 mg, Oral, Q4H PRN, Flora Lipps, MD, 2 mg at 09/24/14 0229 . metoprolol (LOPRESSOR) injection 2.5 mg, 2.5 mg, Intravenous, Q6H PRN, Darlin Coco, MD . pantoprazole (PROTONIX) EC tablet 40 mg, 40 mg, Oral, Daily, Asiyah Cletis Media, MD, 40 mg at 09/27/14 0939 . potassium chloride SA (K-DUR,KLOR-CON) CR tablet 60 mEq, 60 mEq, Oral, Daily, Rogue Bussing, MD, 60 mEq at 09/27/14 9811  Patients Current Diet: Diet renal with fluid restriction Fluid restriction:: 1200 mL Fluid; Room service appropriate?: Yes; Fluid consistency:: Thin  Precautions / Restrictions Precautions Precautions: Fall Restrictions Weight Bearing Restrictions: No   Has the patient had 2 or more falls or a fall with injury in the past year?Yes. Had a fall and broke her pelvic bone, did not need surgery.  Prior Activity Level Limited Community (1-2x/wk): Went out 2-3 X a week for errands.  Home Assistive Devices / Equipment Home Assistive Devices/Equipment: None Home Equipment: Walker - 2 wheels, Bedside commode, Cane - single point  Prior Device Use: Indicate devices/aids used by the patient prior to current illness, exacerbation or injury? None of the above. Recently used a cane for knee pain.  Prior Functional Level Prior Function Level of Independence: Independent  Self Care: Did the patient need help bathing, dressing, using the toilet or eating? Independent  Indoor Mobility: Did the patient need assistance with walking from room to room (with or without device)? Independent  Stairs: Did the patient need assistance with internal or external stairs (with or without device)? Independent  Functional Cognition: Did the patient need help planning regular tasks such as shopping or remembering to take medications? Independent  Current Functional Level Cognition  Overall Cognitive Status: Within Functional Limits  for tasks assessed Orientation Level: Oriented X4   Extremity Assessment (includes Sensation/Coordination)  Upper Extremity Assessment: Defer to OT evaluation  Lower Extremity Assessment: RLE deficits/detail, LLE deficits/detail RLE Deficits / Details: grossly 3-/5 LLE Deficits / Details: grossly 2+/5, left foot externally rotated with DF limited  1/5 movement    ADLs       Mobility  Overal bed mobility: Needs Assistance Bed Mobility: Supine to Sit, Sit to Supine, Rolling Rolling: Supervision Supine to sit: Mod assist, HOB elevated Sit to supine: Supervision (For lines/leads management.) General bed mobility comments: Patient found seated in recliner.    Transfers  Overall transfer level: Needs assistance Equipment used: Rolling walker (2 wheeled) Transfers: Sit to/from Stand Sit to Stand: Mod assist, Min assist Stand pivot transfers: Mod assist General transfer comment: Patient required less assistance during second transfer.     Ambulation / Gait / Stairs / Wheelchair Mobility  Ambulation/Gait Ambulation/Gait assistance: Min assist, +2 safety/equipment (Follow with chair.) Ambulation Distance (Feet): 12 Feet Assistive device: Rolling walker (2 wheeled) Gait Pattern/deviations: Step-through pattern, Shuffle General Gait Details: Patient ambulated a few feet with RW and min A x1. She fatigued quickly. Gait velocity interpretation: <1.8 ft/sec, indicative of risk for recurrent falls    Posture / Balance Balance Overall balance assessment: Needs assistance Sitting-balance support: Feet supported Sitting balance-Leahy Scale: Good Standing balance support: Bilateral upper extremity supported Standing balance-Leahy Scale: Fair Standing balance comment: Patient able to stand with B UE assist and RW to apply lotion to dry skin on low back and buttocks.    Special needs/care consideration BiPAP/CPAP No CPM No Continuous Drip IV No Dialysis No   Life Vest No Oxygen 02 2 L [place] in hospital, but none at home Special Bed No Trach Size No Wound Vac (area) No  Skin Red areas on buttocks  Bowel mgmt: Last BM 09/26/14 Bladder mgmt: Urinary catheter in place Diabetic mgmt: No    Previous Home Environment Living Arrangements: Alone Available Help at Discharge: Family, Available 24 hours/day (sons can help, pt has d/c'd to their homes in past prn) Type of Home: House Home Layout: Multi-level, 1/2 bath on main level, Bed/bath upstairs Alternate Level Stairs-Rails: None Alternate Level Stairs-Number of Steps: 5 Home Access: Stairs to enter Entrance Stairs-Rails: Can reach both, Left, Right Entrance Stairs-Number of Steps: 4 Bathroom Shower/Tub: Gaffer, Door [employer] Toilet: Standard Bathroom Accessibility: Yes Home Care Services: No Additional Comments: See comments section above for info about pt d/c plan.  Discharge Living Setting Plans for Discharge Living Setting: House, Lives with (comment) (May discharge home with one of sons, [name].) Type of Home at Discharge: House Discharge Home Layout: One level Discharge Home Access: Stairs to enter Entrance Stairs-Number of Steps: 3 steps at the front and 3 steps at the back entry. Does the patient have any problems obtaining your medications?: No  Social/Family/Support Systems Patient Roles: Parent (Widow X 5 yrs. Has 3 sons.) Contact Information: Tyashia Morrisette - son (h) [phone] (c) [phone] Anticipated Caregiver: sons and hired help as needed Ability/Limitations of Caregiver: Sons work. Can discharge home with son, [name]. 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 Additional Information: Was planning to go to [place], but became ill and could not go to [employer] Pt/Family Agrees to Admission and willing to participate: Yes Program Orientation Provided & Reviewed with Pt/Caregiver  Including Roles & Responsibilities: Yes  Decrease burden of Care through IP rehab admission: N/A  Possible need for SNF placement upon discharge: Not planned. May need ALF or may need to hire help if she goes home with son.  Patient Condition: This patient's medical and functional status has changed since the consult dated: 09/22/14 in which the Rehabilitation Physician determined and documented that the patient's condition is appropriate for intensive rehabilitative care in an inpatient rehabilitation facility. See "History of Present Illness" (above) for medical update. Functional changes are: Currently requiring min/mod assist for transfers and min assist to ambulate 12 ft RW. Patient's medical and functional status update has been discussed with the Rehabilitation physician and patient remains appropriate for inpatient rehabilitation. Will admit to inpatient rehab today.  Preadmission Screen Completed By: Retta Diones, 09/27/2014 10:23 AM ______________________________________________________________________  Discussed status with Dr. Naaman Plummer on 09/27/14 at 1021 and received telephone approval for admission today.  Admission Coordinator: Retta Diones, time1021/Date08/16/16          Cosigned by: Meredith Staggers, MD at 09/27/2014 10:29 AM  Revision History     Date/Time User Provider Type Action   09/27/2014 10:29 AM Meredith Staggers, MD Physician Cosign   09/27/2014 10:24 AM Retta Diones, RN Rehab Admission Coordinator Sign

## 2014-09-27 NOTE — Progress Notes (Signed)
ANTICOAGULATION CONSULT NOTE - Initial Consult  Pharmacy Consult for apixaban  Indication: atrial fibrillation  Allergies  Allergen Reactions  . Iohexol Other (See Comments)    unknown  . Ivp Dye [Iodinated Diagnostic Agents] Hives  . Scallops [Shellfish Allergy] Other (See Comments)    Projectile vomiting.  . Sulfa Antibiotics Hives  . Sulfamethoxazole Hives    REACTION: unspecified    Patient Measurements: Height: 5' 1"  (154.9 cm) Weight: 159 lb 9.6 oz (72.394 kg) IBW/kg (Calculated) : 47.8 Heparin Dosing Weight:   Vital Signs: Temp: 97.9 F (36.6 C) (08/16 0642) Temp Source: Oral (08/16 0642) BP: 95/57 mmHg (08/16 1333) Pulse Rate: 119 (08/16 1333)  Labs:  Recent Labs  09/25/14 0305 09/26/14 0323 09/27/14 0341  HGB 11.0* 11.1* 10.4*  HCT 31.5* 32.3* 30.8*  PLT 109* 123* 134*  CREATININE 4.34* 4.37* 4.45*    Estimated Creatinine Clearance: 8.9 mL/min (by C-G formula based on Cr of 4.45).   Medical History: Past Medical History  Diagnosis Date  . S/P dilatation of esophageal stricture 2002  . Hypertension   . Hyperlipidemia   . DVT (deep venous thrombosis) 2009  . PE (pulmonary embolism) 2009  . GERD (gastroesophageal reflux disease)   . Diverticulosis   . Iron deficiency anemia   . Hyperplastic colon polyp   . Positive H. pylori test 01/20/96  . Degenerative joint disease   . Skin cancer     h/o basal and squamous skin cancer--> removed  . Persistent atrial fibrillation   . Shortness of breath dyspnea   . AKI (acute kidney injury) 09/15/2014  . Colitis, ulcerative     Medications:  Scheduled:  . apixaban  2.5 mg Oral BID  . triamcinolone cream   Topical BID   Infusions:     Assessment: 81 yo female with afib will be started on apixaban.  Age > 80 and SCr >1.5 warrant lower dose of apixaban.  Goal of Therapy:   Monitor platelets by anticoagulation protocol: Yes   Plan:  - start apixaban 2.5 mg po bid - monitor renal function and  CBC closely - Pharmacy sign off, will monitor labs and clinical course peripherally.  Thanks!  Maryanna Shape, PharmD, BCPS  Clinical Pharmacist  Pager: [phone]   09/27/2014,3:06 PM

## 2014-09-27 NOTE — PMR Pre-admission (Signed)
PMR Admission Coordinator Pre-Admission Assessment  Patient: Sara Russell is an 81 y.o., female MRN: [id number] DOB: Oct 29, 1930 Height: 5' 1"  (154.9 cm) Weight: 71.804 kg (158 lb 4.8 oz) (b scale)              Insurance Information HMO: No    PPO:       PCP:       IPA:       80/20:       OTHER:   PRIMARY: Medicare A/B      Policy#: [id number]      Subscriber: Antonieta Loveless CM Name:        Phone#:       Fax#:   Pre-Cert#:        Employer: Retired Benefits:  Phone #:       Name: Checked in [place]. Date: 08/11/97     Deduct: $1288      Out of Pocket Max: none      Life Max: unlimited CIR: 100%      SNF: 100 days Outpatient: 80%     Co-Pay: 20% Home Health: 100%      Co-Pay: none DME: 80%     Co-Pay: 20% Providers: patient's choice  SECONDARY: GHI      Policy#: [id number]      Subscriber: Antonieta Loveless CM Name:        Phone#:       Fax#:   Pre-Cert#:        Employer: Retired Benefits:  Phone #: [phone]     Name:   Eff. Date:       Deduct:        Out of Pocket Max:        Life Max:   CIR:        SNF:   Outpatient:       Co-Pay:   Home Health:        Co-Pay:   DME:       Co-Pay:    Emergency Contact Information Contact Information    Name Relation Home Work Emmett Son [phone]  [phone]   Hatice, Bubel   [phone]   Vlada, Uriostegui   [phone]     Current Medical History  Patient Admitting Diagnosis:Deconditioning related to sepsis with acute renal failure    History of Present Illness: A 81 y.o. right handed female with history of hypertension, atrial fibrillation, pulmonary emboli/DVT maintained on Eliquis, systolic congestive heart failure. Independent with a walker prior to admission living alone. Patient had just signed contracts to enter [facility] independent living facility. Presented 09/15/2014 with increasing shortness of breath and dizziness on exertion. Denied chest pain or fever. Chest x-ray noted cardiomegaly without effusion  or pulmonary edema. Creatinine elevated 2.25 as well as hyponatremia 128. White blood cell count 12,100. Troponin mildly elevated 0.28. Urinalysis negative. Cardiology service was consulted for elevated troponins with echocardiogram showing ejection fraction of 50% as well as septal hypokinesis. Serial follow-up troponins placed on amiodarone for cardiac rate control. Patient spiked a fever 104.7 rectally and increasing respiratory distress 09/16/2014 with rapid response contacted and placed on BiPAP.. Maintained on broad-spectrum anti-biotics. Nephrology services consulted in light of elevated creatinine as he continued to elevate with latest creatinine 4.31 suspect acute renal failure secondary to ischemic ATN from bouts of hypotension as well as ACE inhibitor that was discontinued. CT abdomen and pelvis showed questionable liver abscess versus mass. MRI of the liver shows a 2.5 cm well circumscribed hyperintense  mass located between the intrahepatic IVC and right adrenal gland. Primary differential considerations included pheochromocytoma, sarcoma or possibly metastasis with workup ongoing. Infectious disease consulted suspect sepsis versus transient bacteremia and currently maintained on intravenous Merrem and later discontinued all anti-biotics with close observation. Physical therapy evaluation completed 09/21/2014 with noted deconditioning and recommendations of physical medicine rehabilitation consult. Palliative care was consulted 09/22/2014 to lessen burden of care. Patient to be admitted for comprehensive inpatient rehabilitation program.   Past Medical History  Past Medical History  Diagnosis Date  . S/P dilatation of esophageal stricture 2002  . Hypertension   . Hyperlipidemia   . DVT (deep venous thrombosis) 2009  . PE (pulmonary embolism) 2009  . GERD (gastroesophageal reflux disease)   . Diverticulosis   . Iron deficiency anemia   . Hyperplastic colon polyp   . Positive H. pylori test  01/20/96  . Degenerative joint disease   . Skin cancer     h/o basal and squamous skin cancer--> removed  . Persistent atrial fibrillation   . Shortness of breath dyspnea   . AKI (acute kidney injury) 09/15/2014  . Colitis, ulcerative     Family History  family history includes Diverticulitis in her brother; Esophageal cancer in her brother; Heart disease in her mother; Stroke in her mother. There is no history of Colon cancer.  Prior Rehab/Hospitalizations:  Has the patient had major surgery during 100 days prior to admission? No  Current Medications   Current facility-administered medications:  .  acetaminophen (TYLENOL) tablet 650 mg, 650 mg, Oral, Q6H PRN, 650 mg at 09/25/14 2056 **OR** acetaminophen (TYLENOL) suppository 650 mg, 650 mg, Rectal, Q6H PRN, Asiyah Cletis Media, MD, 650 mg at 09/18/14 1624 .  amiodarone (PACERONE) tablet 200 mg, 200 mg, Oral, Daily, Darlin Coco, MD, 200 mg at 09/27/14 0939 .  apixaban (ELIQUIS) tablet 2.5 mg, 2.5 mg, Oral, BID, Alveda Reasons, MD, 2.5 mg at 09/27/14 0939 .  atorvastatin (LIPITOR) tablet 10 mg, 10 mg, Oral, q1800, Asiyah Cletis Media, MD, 10 mg at 09/26/14 1713 .  carvedilol (COREG) tablet 6.25 mg, 6.25 mg, Oral, BID WC, Bhavinkumar Bhagat, PA, 6.25 mg at 09/27/14 0827 .  feeding supplement (NEPRO CARB STEADY) liquid 237 mL, 237 mL, Oral, TID BM, Fleet Contras, MD, 237 mL at 09/27/14 1000 .  furosemide (LASIX) tablet 80 mg, 80 mg, Oral, BID, Zenia Resides, MD, 80 mg at 09/27/14 9379 .  loperamide (IMODIUM) 1 MG/5ML solution 2 mg, 2 mg, Oral, Q4H PRN, Flora Lipps, MD, 2 mg at 09/24/14 0229 .  metoprolol (LOPRESSOR) injection 2.5 mg, 2.5 mg, Intravenous, Q6H PRN, Darlin Coco, MD .  pantoprazole (PROTONIX) EC tablet 40 mg, 40 mg, Oral, Daily, Asiyah Cletis Media, MD, 40 mg at 09/27/14 0939 .  potassium chloride SA (K-DUR,KLOR-CON) CR tablet 60 mEq, 60 mEq, Oral, Daily, Rogue Bussing, MD, 60 mEq at 09/27/14  0240  Patients Current Diet: Diet renal with fluid restriction Fluid restriction:: 1200 mL Fluid; Room service appropriate?: Yes; Fluid consistency:: Thin  Precautions / Restrictions Precautions Precautions: Fall Restrictions Weight Bearing Restrictions: No   Has the patient had 2 or more falls or a fall with injury in the past year?Yes.  Had a fall and broke her pelvic bone, did not need surgery.  Prior Activity Level Limited Community (1-2x/wk): Went out 2-3 X a week for errands.  Home Assistive Devices / Equipment Home Assistive Devices/Equipment: None Home Equipment: Environmental consultant - 2 wheels, Bedside commode, Cane - single point  Prior Device Use: Indicate devices/aids used by the patient prior to current illness, exacerbation or injury? None of the above.  Recently used a cane for knee pain.  Prior Functional Level Prior Function Level of Independence: Independent  Self Care: Did the patient need help bathing, dressing, using the toilet or eating?  Independent  Indoor Mobility: Did the patient need assistance with walking from room to room (with or without device)? Independent  Stairs: Did the patient need assistance with internal or external stairs (with or without device)? Independent  Functional Cognition: Did the patient need help planning regular tasks such as shopping or remembering to take medications? Independent  Current Functional Level Cognition  Overall Cognitive Status: Within Functional Limits for tasks assessed Orientation Level: Oriented X4    Extremity Assessment (includes Sensation/Coordination)  Upper Extremity Assessment: Defer to OT evaluation  Lower Extremity Assessment: RLE deficits/detail, LLE deficits/detail RLE Deficits / Details: grossly 3-/5 LLE Deficits / Details: grossly 2+/5, left foot externally rotated with DF limited 1/5 movement    ADLs       Mobility  Overal bed mobility: Needs Assistance Bed Mobility: Supine to Sit, Sit to Supine,  Rolling Rolling: Supervision Supine to sit: Mod assist, HOB elevated Sit to supine: Supervision (For lines/leads management.) General bed mobility comments: Patient found seated in recliner.    Transfers  Overall transfer level: Needs assistance Equipment used: Rolling walker (2 wheeled) Transfers: Sit to/from Stand Sit to Stand: Mod assist, Min assist Stand pivot transfers: Mod assist General transfer comment: Patient required less assistance during second transfer.     Ambulation / Gait / Stairs / Wheelchair Mobility  Ambulation/Gait Ambulation/Gait assistance: Min assist, +2 safety/equipment (Follow with chair.) Ambulation Distance (Feet): 12 Feet Assistive device: Rolling walker (2 wheeled) Gait Pattern/deviations: Step-through pattern, Shuffle General Gait Details: Patient ambulated a few feet with RW and min A x1. She fatigued quickly. Gait velocity interpretation: <1.8 ft/sec, indicative of risk for recurrent falls    Posture / Balance Balance Overall balance assessment: Needs assistance Sitting-balance support: Feet supported Sitting balance-Leahy Scale: Good Standing balance support: Bilateral upper extremity supported Standing balance-Leahy Scale: Fair Standing balance comment: Patient able to stand with B UE assist and RW to apply lotion to dry skin on low back and buttocks.    Special needs/care consideration BiPAP/CPAP No CPM No Continuous Drip IV No Dialysis No         Life Vest No Oxygen 02 2 L [place] in hospital, but none at home Special Bed No Trach Size No Wound Vac (area) No      Skin Red areas on buttocks                              Bowel mgmt: Last BM 09/26/14 Bladder mgmt: Urinary catheter in place Diabetic mgmt: No    Previous Home Environment Living Arrangements: Alone Available Help at Discharge: Family, Available 24 hours/day (sons can help, pt has d/c'd to their homes in past prn) Type of Home: House Home Layout: Multi-level, 1/2 bath on main  level, Bed/bath upstairs Alternate Level Stairs-Rails: None Alternate Level Stairs-Number of Steps: 5 Home Access: Stairs to enter Entrance Stairs-Rails: Can reach both, Left, Right Entrance Stairs-Number of Steps: 4 Bathroom Shower/Tub: Gaffer, Door [employer] Toilet: Standard Bathroom Accessibility: Yes Home Care Services: No Additional Comments: See comments section above for info about pt d/c plan.  Discharge Living Setting Plans for Discharge Living Setting: House, Lives  with (comment) (May discharge home with one of sons, [name].) Type of Home at Discharge: House Discharge Home Layout: One level Discharge Home Access: Stairs to enter Entrance Stairs-Number of Steps: 3 steps at the front and 3 steps at the back entry. Does the patient have any problems obtaining your medications?: No  Social/Family/Support Systems Patient Roles: Parent (Widow X 5 yrs.  Has 3 sons.) Contact Information: Verta Riedlinger - son (h) [phone] (c) [phone] Anticipated Caregiver: sons and hired help as needed Ability/Limitations of Caregiver: Sons work.  Can discharge home with son, [name]. Caregiver Availability: Intermittent Discharge Plan Discussed with Primary Caregiver: Yes Is Caregiver In Agreement with Plan?: Yes Does Caregiver/Family have Issues with Lodging/Transportation while Pt is in Rehab?: No  Goals/Additional Needs Patient/Family Goal for Rehab: PT/OT supervision goals Expected length of stay: 10-14 days Cultural Considerations: None Dietary Needs: Renal diet, 1200 ml fluid restriction, thin liquids Equipment Needs: TBD Special Service Needs: Patient is a retired Therapist, sports.  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 Additional Information: Was planning to go to [facility], but became ill and could not go to [employer] Pt/Family Agrees to Admission and willing to participate: Yes Program Orientation Provided & Reviewed with Pt/Caregiver Including Roles  &  Responsibilities: Yes  Decrease burden of Care through IP rehab admission: N/A  Possible need for SNF placement upon discharge: Not planned.  May need ALF or may need to hire help if she goes home with son.  Patient Condition: This patient's medical and functional status has changed since the consult dated: 09/22/14 in which the Rehabilitation Physician determined and documented that the patient's condition is appropriate for intensive rehabilitative care in an inpatient rehabilitation facility. See "History of Present Illness" (above) for medical update. Functional changes are:  Currently requiring min/mod assist for transfers and min assist to ambulate 12 ft RW. Patient's medical and functional status update has been discussed with the Rehabilitation physician and patient remains appropriate for inpatient rehabilitation. Will admit to inpatient rehab today.  Preadmission Screen Completed By:  Retta Diones, 09/27/2014 10:23 AM ______________________________________________________________________   Discussed status with Dr. Naaman Plummer on 09/27/14 at 1021 and received telephone approval for admission today.  Admission Coordinator:  Retta Diones, time1021/Date08/16/16

## 2014-09-27 NOTE — Progress Notes (Signed)
Please let cardiology know when patient is ready to discharge from inpatient rehab to schedule f/u appointment.   Deirdre Gryder, PA-C

## 2014-09-27 NOTE — H&P (Signed)
Physical Medicine and Rehabilitation Admission H&P   Chief Complaint  Patient presents with  . Dizziness  . Shortness of Breath  : HPI: Sara Russell is a 81 y.o. right handed female with history of hypertension, atrial fibrillation, pulmonary emboli/DVT maintained on Eliquis, systolic congestive heart failure. Independent with a walker prior to admission living alone. Patient had just signed contracts to enter [facility] independent living facility. Presented 09/15/2014 with increasing shortness of breath and dizziness on exertion. Denied chest pain or fever. Chest x-ray noted cardiomegaly without effusion or pulmonary edema. Creatinine elevated 2.25 as well as hyponatremia 128. White blood cell count 12,100. Troponin mildly elevated 0.28. Urinalysis negative. Cardiology service was consulted for elevated troponins with echocardiogram showing ejection fraction of 50% as well as septal hypokinesis. Serial follow-up troponins placed on amiodarone for cardiac rate control. Patient spiked a fever 104.7 rectally and increasing respiratory distress 09/16/2014 with rapid response contacted and placed on BiPAP.. Maintained on broad-spectrum anti-biotics. Nephrology services consulted in light of elevated creatinine as he continued to elevate with latest creatinine 4.31 suspect acute renal failure secondary to ischemic ATN from bouts of hypotension as well as ACE inhibitor that was discontinued. CT abdomen and pelvis showed questionable liver abscess versus mass. MRI of the liver shows a 2.5 cm well circumscribed hyperintense mass located between the intrahepatic IVC and right adrenal gland. Primary differential considerations included pheochromocytoma, sarcoma or possibly metastasis with workup ongoing. Infectious disease consulted suspect sepsis versus transient bacteremia and currently maintained on intravenous Merrem and later discontinued all anti-biotics with close observation. Physical  therapy evaluation completed 09/21/2014 with noted deconditioning and recommendations of physical medicine rehabilitation consult. Palliative care was consulted 09/22/2014 to lessen burden of care. Patient was admitted for comprehensive rehabilitation program  ROS Review of Systems  Constitutional: Positive for fever and chills.  Eyes: Negative for blurred vision and double vision.  Respiratory: Negative for cough.   Shortness of breath on exertion  Cardiovascular: Positive for palpitations. Negative for chest pain.  Gastrointestinal: Positive for nausea and constipation.   GERD  Genitourinary: Positive for dysuria and hematuria.  Musculoskeletal: Positive for myalgias and joint pain.  Skin: Negative for rash.  Neurological: Positive for dizziness and weakness. Negative for headaches   Past Medical History  Diagnosis Date  . S/P dilatation of esophageal stricture 2002  . Hypertension   . Hyperlipidemia   . DVT (deep venous thrombosis) 2009  . PE (pulmonary embolism) 2009  . GERD (gastroesophageal reflux disease)   . Diverticulosis   . Iron deficiency anemia   . Hyperplastic colon polyp   . Positive H. pylori test 01/20/96  . Degenerative joint disease   . Skin cancer     h/o basal and squamous skin cancer--> removed  . Persistent atrial fibrillation   . Shortness of breath dyspnea   . AKI (acute kidney injury) 09/15/2014  . Colitis, ulcerative    Past Surgical History  Procedure Laterality Date  . Dilation and curettage of uterus      for endometrial polyps  . Replacement total knee      left  . Tonsillectomy    . Appendectomy    . Breast lumpectomy      benign  . Joint replacement     Family History  Problem Relation Age of Onset  . Esophageal cancer Brother   . Diverticulitis Brother   . Colon cancer Neg Hx   . Heart disease Mother    . Stroke Mother  Social History:  reports that she quit smoking about 53 years ago. Her smoking use included Cigarettes. She started smoking about 66 years ago. She has a 6.5 pack-year smoking history. She has never used smokeless tobacco. She reports that she drinks alcohol. She reports that she does not use illicit drugs. Allergies:  Allergies  Allergen Reactions  . Iohexol Other (See Comments)    unknown  . Ivp Dye [Iodinated Diagnostic Agents] Hives  . Scallops [Shellfish Allergy] Other (See Comments)    Projectile vomiting.  . Sulfa Antibiotics Hives  . Sulfamethoxazole Hives    REACTION: unspecified   Medications Prior to Admission  Medication Sig Dispense Refill  . acetaminophen (TYLENOL) 650 MG CR tablet Take 650 mg by mouth every 8 (eight) hours as needed for pain.    Marland Kitchen atorvastatin (LIPITOR) 10 MG tablet TAKE ONE TABLET BY MOUTH ONCE DAILY AT 6PM 90 tablet 3  . benazepril (LOTENSIN) 40 MG tablet TAKE ONE TABLET BY MOUTH ONCE DAILY 90 tablet 3  . calcium carbonate (TUMS - DOSED IN MG ELEMENTAL CALCIUM) 500 MG chewable tablet Chew 1 tablet by mouth 2 (two) times daily.     . calcium-vitamin D (SM CALCIUM-VITAMIN D) 500-200 MG-UNIT per tablet Take 1 tablet by mouth 2 (two) times daily.     Marland Kitchen diltiazem (CARDIZEM CD) 180 MG 24 hr capsule Take 1 capsule (180 mg total) by mouth daily. 90 capsule 3  . ELIQUIS 5 MG TABS tablet TAKE 1 TABLET TWICE A DAY 180 tablet 0  . ferrous sulfate 325 (65 FE) MG tablet Take 1 tablet (325 mg total) by mouth daily with breakfast. 1 tablet 0  . metoprolol (LOPRESSOR) 100 MG tablet TAKE ONE TABLET BY MOUTH TWICE DAILY 180 tablet 3  . omeprazole (PRILOSEC) 40 MG capsule TAKE ONE CAPSULE BY MOUTH ONCE DAILY 90 capsule 3  . traMADol (ULTRAM) 50 MG tablet TAKE ONE TABLET BY MOUTH EVERY 6 HOURS AS NEEDED FOR PAIN 180 tablet 1    Home: Home Living Family/patient  expects to be discharged to:: Private residence Living Arrangements: Alone Available Help at Discharge: Family, Available 24 hours/day (sons can help, pt has d/c'd to their homes in past prn) Type of Home: House Home Access: Stairs to enter [employer] of Steps: 4 Entrance Stairs-Rails: Can reach both, Left, Right Home Layout: Multi-level, 1/2 bath on main level, Bed/bath upstairs Alternate Level Stairs-Number of Steps: 5 Alternate Level Stairs-Rails: None Bathroom Shower/Tub: Gaffer, Door [employer] Toilet: Standard Bathroom Accessibility: Yes Home Equipment: Environmental consultant - 2 wheels, Bedside commode, Cane - single point Additional Comments: See comments section above for info about pt d/c plan.  Functional History: Prior Function Level of Independence: Independent  Functional Status:  Mobility: Bed Mobility General bed mobility comments: in chair on arrival Transfers Overall transfer level: Needs assistance Equipment used: 2 person hand held assist Transfers: Sit to/from Stand Sit to Stand: Mod assist, +2 physical assistance Gait: min assist 12' General transfer comment: Pt used hands to power up but needed mod assist to physically stand all the way up.       ADL: min assist    Cognition: Cognition Overall Cognitive Status: Within Functional Limits for tasks assessed Orientation Level: Oriented X4 Cognition Arousal/Alertness: Awake/alert Behavior During Therapy: WFL for tasks assessed/performed Overall Cognitive Status: Within Functional Limits for tasks assessed  Physical Exam: Blood pressure 110/43, pulse 98, temperature 95.7 F (35.4 C), temperature source Oral, resp. rate 15, height 5' 1"  (1.549 m), weight 68.1 kg (  150 lb 2.1 oz), SpO2 96 %.  Constitutional: She is oriented to person, place, and time.  HENT: oral mucosa pink and moist Head: Normocephalic.  Eyes: EOM are normal.  Neck: Normal range of motion. Neck supple. No thyromegaly  present.  Cardiovascular:  Cardiac rate controlled, irregularly irregular  Respiratory: Effort normal and breath sounds normal. No respiratory distress.  GI: Soft. Bowel sounds are normal. She exhibits no distension. There is no tenderness.  Neurological: She is alert and oriented to person, place, and time.  Motor strength is 4/5 bilateral deltoids, biceps, triceps, grip, hip flexor, knee extensor, ankle dorsiflexor and plantar flexor, DTR's 2+ Sensation is intact to light touch in bilateral upper and lower limbs Mood and affect are appropriate There is no evidence of dysarthria, good phonation. Coordination appears normal   Good insight and awareness.  Skin: intact, warm Psych: pleasant and appropriate    Lab Results Last 48 Hours    Results for orders placed or performed during the hospital encounter of 09/15/14 (from the past 48 hour(s))  Renal function panel Status: Abnormal   Collection Time: 09/22/14 3:20 AM  Result Value Ref Range   Sodium 132 (L) 135 - 145 mmol/L   Potassium 3.8 3.5 - 5.1 mmol/L   Chloride 101 101 - 111 mmol/L   CO2 16 (L) 22 - 32 mmol/L   Glucose, Bld 76 65 - 99 mg/dL   BUN 47 (H) 6 - 20 mg/dL   Creatinine, Ser 4.17 (H) 0.44 - 1.00 mg/dL   Calcium 7.6 (L) 8.9 - 10.3 mg/dL   Phosphorus 5.2 (H) 2.5 - 4.6 mg/dL   Albumin 1.6 (L) 3.5 - 5.0 g/dL   GFR calc non Af Amer 9 (L) >60 mL/min   GFR calc Af Amer 11 (L) >60 mL/min    Comment: (NOTE) The eGFR has been calculated using the CKD EPI equation. This calculation has not been validated in all clinical situations. eGFR's persistently <60 mL/min signify possible Chronic Kidney Disease.    Anion gap 15 5 - 15  CBC Status: Abnormal   Collection Time: 09/22/14 3:20 AM  Result Value Ref Range   WBC 10.0 4.0 - 10.5 K/uL    Comment: WHITE COUNT CONFIRMED ON SMEAR   RBC 4.15 3.87 - 5.11 MIL/uL   Hemoglobin  12.1 12.0 - 15.0 g/dL   HCT 35.1 (L) 36.0 - 46.0 %   MCV 84.6 78.0 - 100.0 fL   MCH 29.2 26.0 - 34.0 pg   MCHC 34.5 30.0 - 36.0 g/dL   RDW 14.4 11.5 - 15.5 %   Platelets 63 (L) 150 - 400 K/uL    Comment: PLATELET COUNT CONFIRMED BY SMEAR  Basic metabolic panel Status: Abnormal   Collection Time: 09/22/14 3:20 AM  Result Value Ref Range   Sodium 132 (L) 135 - 145 mmol/L   Potassium 4.1 3.5 - 5.1 mmol/L   Chloride 101 101 - 111 mmol/L   CO2 15 (L) 22 - 32 mmol/L   Glucose, Bld 73 65 - 99 mg/dL   BUN 46 (H) 6 - 20 mg/dL   Creatinine, Ser 4.09 (H) 0.44 - 1.00 mg/dL   Calcium 7.6 (L) 8.9 - 10.3 mg/dL   GFR calc non Af Amer 9 (L) >60 mL/min   GFR calc Af Amer 11 (L) >60 mL/min    Comment: (NOTE) The eGFR has been calculated using the CKD EPI equation. This calculation has not been validated in all clinical situations. eGFR's persistently <60 mL/min signify  possible Chronic Kidney Disease.    Anion gap 16 (H) 5 - 15  Magnesium Status: None   Collection Time: 09/22/14 3:20 AM  Result Value Ref Range   Magnesium 1.8 1.7 - 2.4 mg/dL  Phosphorus Status: Abnormal   Collection Time: 09/22/14 3:20 AM  Result Value Ref Range   Phosphorus 5.2 (H) 2.5 - 4.6 mg/dL  C difficile quick scan w PCR reflex Status: None   Collection Time: 09/22/14 11:32 PM  Result Value Ref Range   C Diff antigen NEGATIVE NEGATIVE   C Diff toxin NEGATIVE NEGATIVE   C Diff interpretation Negative for toxigenic C. difficile   Occult blood card to lab, stool RN will collect Status: Abnormal   Collection Time: 09/22/14 11:35 PM  Result Value Ref Range   Fecal Occult Bld POSITIVE (A) NEGATIVE  CBC Status: Abnormal   Collection Time: 09/23/14 2:28 AM  Result Value Ref Range   WBC 8.6 4.0 - 10.5 K/uL   RBC 3.61 (L) 3.87 - 5.11 MIL/uL    Hemoglobin 10.8 (L) 12.0 - 15.0 g/dL   HCT 30.8 (L) 36.0 - 46.0 %   MCV 85.3 78.0 - 100.0 fL   MCH 29.9 26.0 - 34.0 pg   MCHC 35.1 30.0 - 36.0 g/dL   RDW 14.2 11.5 - 15.5 %   Platelets 100 (L) 150 - 400 K/uL    Comment: CONSISTENT WITH PREVIOUS RESULT  Basic metabolic panel Status: Abnormal   Collection Time: 09/23/14 2:28 AM  Result Value Ref Range   Sodium 133 (L) 135 - 145 mmol/L   Potassium 3.4 (L) 3.5 - 5.1 mmol/L    Comment: DELTA CHECK NOTED NO VISIBLE HEMOLYSIS    Chloride 98 (L) 101 - 111 mmol/L   CO2 21 (L) 22 - 32 mmol/L   Glucose, Bld 97 65 - 99 mg/dL   BUN 50 (H) 6 - 20 mg/dL   Creatinine, Ser 4.31 (H) 0.44 - 1.00 mg/dL   Calcium 7.5 (L) 8.9 - 10.3 mg/dL   GFR calc non Af Amer 9 (L) >60 mL/min   GFR calc Af Amer 10 (L) >60 mL/min    Comment: (NOTE) The eGFR has been calculated using the CKD EPI equation. This calculation has not been validated in all clinical situations. eGFR's persistently <60 mL/min signify possible Chronic Kidney Disease.    Anion gap 14 5 - 15  Magnesium Status: None   Collection Time: 09/23/14 2:28 AM  Result Value Ref Range   Magnesium 1.7 1.7 - 2.4 mg/dL  Phosphorus Status: Abnormal   Collection Time: 09/23/14 2:28 AM  Result Value Ref Range   Phosphorus 5.2 (H) 2.5 - 4.6 mg/dL  Occult blood card to lab, stool RN will collect Status: Abnormal   Collection Time: 09/23/14 3:26 AM  Result Value Ref Range   Fecal Occult Bld POSITIVE (A) NEGATIVE      Imaging Results (Last 48 hours)    US Renal  09/22/2014 CLINICAL DATA: Acute renal failure EXAM: RENAL / URINARY TRACT ULTRASOUND COMPLETE COMPARISON: 09/18/2014 FINDINGS: Right Kidney: Length: 10.8 cm. Echogenicity within normal limits. No mass or hydronephrosis visualized. Left Kidney: Length: 11.6 cm. A 2.9 cm cyst is noted in the upper pole  similar to that seen on prior CT examination. Bladder: Decompressed by Foley catheter. Note is made again of a left adnexal cyst. IMPRESSION: Left renal cyst. No other focal renal abnormality is noted. Electronically Signed By: Inez Catalina M.D. On: 09/22/2014 11:00  Medical Problem List and Plan: 1. Functional deficits secondary to debilitation related to sepsis with acute renal failure 2. DVT Prophylaxis/Anticoagulation: SCDs. Monitor for any signs of DVT 3. Pain Management: Tylenol as needed 4. Acute renal failure secondary to ischemic ATN. Follow-up renal services 5. Neuropsych: This patient is capable of making decisions on her own behalf. 6. Skin/Wound Care: Routine skin checks 7. Fluids/Electrolytes/Nutrition: Routine I&O with follow-up chemistries 8. ID/sepsis/liver mass. All antibodies currently discontinued and observe 9. Hypertension/atrial fibrillation. Continue amiodarone 200 mg daily, Coreg 3.125 mg twice a day. Question plan to resume Eliquis. Cardiac rate controlled 10. Systolic congestive heart failure. Monitor for any signs of fluid overload 11. Hyperlipidemia. Lipitor  Post Admission Physician Evaluation: 1. Functional deficits secondary to debility after sepsis and acute renal failure. 2. Patient is admitted to receive collaborative, interdisciplinary care between the physiatrist, rehab nursing staff, and therapy team. 3. Patient's level of medical complexity and substantial therapy needs in context of that medical necessity cannot be provided at a lesser intensity of care such as a SNF. 4. Patient has experienced substantial functional loss from his/her baseline which was documented above under the "Functional History" and "Functional Status" headings. Judging by the patient's diagnosis, physical exam, and functional history, the patient has potential for functional progress which will result in measurable gains while on inpatient rehab. These gains  will be of substantial and practical use upon discharge in facilitating mobility and self-care at the household level. 5. Physiatrist will provide 24 hour management of medical needs as well as oversight of the therapy plan/treatment and provide guidance as appropriate regarding the interaction of the two. 6. 24 hour rehab nursing will assist with bladder management, bowel management, safety, skin/wound care, disease management, medication administration, pain management and patient education and help integrate therapy concepts, techniques,education, etc. 7. PT will assess and treat for/with: Lower extremity strength, range of motion, stamina, balance, functional mobility, safety, adaptive techniques and equipment, activity tolerance, NMR, community reintegration. Goals are: mod I. 8. OT will assess and treat for/with: ADL's, functional mobility, safety, upper extremity strength, adaptive techniques and equipment, NMR, activity tolerance, community reintegration. Goals are: mod I. Therapy may proceed with showering this patient. 9. SLP will assess and treat for/with: n/a. Goals are: n/a. 10. Case Management and Social Worker will assess and treat for psychological issues and discharge planning. 11. Team conference will be held weekly to assess progress toward goals and to determine barriers to discharge. 12. Patient will receive at least 3 hours of therapy per day at least 5 days per week. 13. ELOS: 7-11 days  14. Prognosis: excellent     Meredith Staggers, MD, [facility] 09/27/2014

## 2014-09-27 NOTE — Progress Notes (Signed)
Family Medicine Teaching Service Daily Progress Note Intern Pager: [phone]  Patient name: Sara Russell Medical record number: [id number] Date of birth: [birth year] Age: 81 y.o. Gender: female  Primary Care Provider: Zigmund Gottron, MD Consultants: Nephrology, Palliative Care, Cardiology   Code Status: DNR   Pt Overview and Major Events to Date:  - Admitted 8/4 for SOB  and Dizziness  - 8/5 Acute decompensation with Acute Respiratory Distress, Hypotension, Atrial fibrillation with RVR  - 8/5 Transferred to CCM  - 8/5 Continued Hypotension and Afib with RVR , Diltiazem drip started/stopped - started Amiodarone drip  - 8/5  Critical Care started patient on Phenylephrine drip for blood pressures   - 8/5 ECHO completed 8/5  with EF 45-50% with septal hypokinesis, mild to moderate RV dilation with mildly decreased systolic function, mild to moderate pulmonary hypertension - 8/6 hypothermic with rigors 8/7 CT ab/pel > heterogenous masslike abnormality along posterior liver, large, unclear if mass or abscess, smsll bilat effusions, distended gallbladder, chronic adnexal cyst - 8/11 Patient off Phenylphrine drip and Amiodarone drip, transferred to PO Amiodarone  - 8/11 Fecal Occult Blood test positive x 2, Hgb/Hct Stable Will follow up as needed as out-patient.  - 8/12 Transferred from CCM back to Family Medicine   Assessment and Plan: MILILANI MURTHY is a 81 y.o. female presenting with SOB, fatigue and dizziness. PMH is significant for afib, PE (currently on eliquis), HTN, HLD, diverticulosis, GERD, esophageal stricture s/p dilatation, anemia.   # Chronic A. fib with RVR. 8/5 ECHO EF 45-50% and mild to moderate pulmonary hypertension. 8/5-8/6 troponins trended between 0.25 -0.17, likely due to demand ischemia  - transfer to tele floor - HR 108-94 overnight  - BP 94/68 - Continue carvedilol 6.25 mg BID - Continue Amiodarone 200 mg PO - Continue Metoprolol 2.5 mg PRN for HR> 120  -  Continue Eliquis (2.5 mg BID)  per pharmacy- if SCr continues to rise will need to consider Coumadin   # Acute oliguric renal failure -  On Admission SCr 2.25  - baseline 1. Patient requiring pressors from 8/5- 8/11. On 8/8 patient with oliguria at 200 cc over 24hrs. UA on 8/9 with protein and rare hgb. FENa of 1.48 which indicates either prerenal or ATN.  On 8/11 renal ultrasound benign. Scr trending down now 4.09>4.31>4.46> 4.34 > 4.37> 4.45  - SCr now trending down to SCr 4.45 - Output of 1.6 Liters in the last 24 hours  - Nephrology following  - Continue daily BMP  - PO lasix  80 BID  - Continue strict I&Os    # Mass btw intrahepatic IVC and right adrenal gland. Primary differential considerations include pheochromocytoma, sarcoma, or possibly metastasis.Patient does not want biopsy because she would not want surgery or chemotherapy - Consulted Palliative -Patient interested in hospice, continue life maintain treatment  - Urine Catacholamines ordered, however note pt was recently on phenylephrine for pressor support  # Mild Normocytic Anemia. Hx of Anemia. Normal range hgb 12.0-14. Patient with long standing anemia on iron at home.  -Hgb 10.8 < 12.1 < 11 < 11 <10.4, remains stable  - Daily CBC  - Continue to monitor   # Hyponatremia. On admission Na 128. Likely due to Acute renal failure/chronic. Stable currently. - Na 133 this AM  - Continue to monitor   # Hypokalemia: K+ 4.1 - repleting 40 mEq kdur daily    # Hypotension. Resolved. -  Continue Monitor   # Resolved Septic Shock. On admission started  on ceftriaxone for possible uti( UA + mod LE but only3-6 wbc).  CXR negative for eumonia. Decompensation over 24-36hr, with high fevers, rigors, tachycardia with afib with rvr, and hypotension. Broaden to vanco/piptazo and  transferred to the icu. Patient continued to have hypotension, fevers, rigors, leukocytosis. Ct abd/pelvis for possible colitis with positive finding for a malignancy  adjacent to the IVC causing a transient bacteremia. BCX 8/4 and 8/5 and UCx NGTD  - Antibiotics Vancomycin 8/5>>>8/9, Zosyn 8/5>>>8/7, Aztreonam 8/7>>>8/10, Meropenem 8/10>>>8/11  - Continue Protonix daily    # Acute hypoxic respiratory failure - resolved. Saturations 95 %.  RR 20.  - Monitor respiratory status  - Continue to encourage incentive spirometry   # Hx of PE/DVT: On Eliquis and reports compliance, doubt recurrent clot as cause of current symptoms - Continue Renally dosed Eliquis  # HLD: Last lipid panel in 07/2013 with LDL 30 - Continue home atorvastatin  # FEN/GI: - Heart Diet  - home PPI   Prophylaxis: None  Disposition:transfer to tele, CIR today.   Subjective:   Patient doing well this morning. Ready to go to rehab.   Objective: Temp:  [97.9 F (36.6 C)-98.3 F (36.8 C)] 97.9 F (36.6 C) (08/16 0642) Pulse Rate:  [94-108] 108 (08/16 0642) Resp:  [18-20] 20 (08/16 0642) BP: (94-107)/(43-79) 94/68 mmHg (08/16 0642) SpO2:  [94 %-95 %] 95 % (08/16 0642) Weight:  [158 lb 4.8 oz (71.804 kg)] 158 lb 4.8 oz (71.804 kg) (08/16 2440)  Physical Exam: General: NAD, patient sitting up on the side of the bed  Cardiovascular: irregular rhythm,, radial pulses 2+  Respiratory: CTAB, normal effort,   Abdomen: BS+, NTND  Extremities: No LE edema.  Laboratory:  Recent Labs Lab 09/25/14 0305 09/26/14 0323 09/27/14 0341  WBC 7.6 10.7* 6.5  HGB 11.0* 11.1* 10.4*  HCT 31.5* 32.3* 30.8*  PLT 109* 123* 134*    Recent Labs Lab 09/25/14 0305 09/26/14 0323 09/27/14 0341  NA 133* 134* 133*  K 3.3* 3.3* 4.1  CL 96* 96* 95*  CO2 25 24 27   BUN 55* 58* 61*  CREATININE 4.34* 4.37* 4.45*  CALCIUM 7.7* 7.8* 7.8*  GLUCOSE 91 92 94    Imaging/Diagnostic Tests:  Ct Abdomen Pelvis Wo Contrast  09/18/2014   CLINICAL DATA:  SepsisHx basal and squamous cell ca., diverticulosis, GERDSurgery's; D&C, tonsillectomy, appendectomy, breast lumpectomy(benign)  EXAM: CT ABDOMEN AND  PELVIS WITHOUT CONTRAST  TECHNIQUE: Multidetector CT imaging of the abdomen and pelvis was performed following the standard protocol without IV contrast.  COMPARISON:  Pelvic ultrasound, 02/27/2012.  FINDINGS: Lung bases: Small pleural effusions. Lower lobe opacity most consistent with atelectasis. Component of pneumonia is possible but felt likely. 5 mm nodule right lower lobe near the oblique fissure. No pulmonary edema. Heart mildly enlarged. Moderate size hiatal hernia.  Liver: 4.5 cm abnormality, which may reflect a mass, which lies along the posterior margin of the liver at the base caudate lobe and is inseparable from the the inferior vena cava. It also abuts the right adrenal gland. Longitudinal extent of this abnormality is approximately 6 cm.  Liver otherwise unremarkable.  Spleen: Unremarkable.  Gallbladder: Distended. No radiopaque stone. No wall thickening or adjacent inflammation. Common bile duct is dilated to approximate 1 cm but shows normal distal tapering.  Adrenal glands. The left adrenal gland thickening consistent with nodular hyperplasia. Right adrenal gland abuts the above described lesion. Is otherwise unremarkable.  Kidneys, ureters, bladder: 3.2 cm low-density lesion in the midpole of the  left kidney consistent with cysts. No other renal masses. Bilateral renal cortical thinning. No stones. Bilateral perinephric stranding, likely chronic. Left ureter is dilated. There is mild prominence of the left renal pelvis without calyx dilation. No ureteral stone. Normal right ureter. Bladder is decompressed by a Foley catheter.  Uterus and adnexa: Uterus is unremarkable. There is a left adnexal water density mass measuring 6.7 x 6.1 x 7.5 cm. This was present on the prior ultrasound and is unchanged in overall size. This could be ovarian origin. It may reflect an inclusion cyst. It stability since the 2014 ultrasound strongly supports a benign etiology.  Lymph nodes:  No enlarged lymph nodes.   Ascites:  None.  Gastrointestinal: No bowel dilation to suggest obstruction. No bowel wall thickening or mesenteric inflammation.  Musculoskeletal: Degenerative changes most evident at L2-L3. Bony structures are demineralized. No osteoblastic or osteolytic lesions.  IMPRESSION: 1. Heterogeneous masslike abnormality along the posterior margin of the liver, centered on the inferior vena cava, from which it is inseparable. It abuts the right adrenal gland. This may have an origin from the posterior margin of the liver or from the inferior vena cava. Adrenal origin is felt unlikely. This could be a neoplastic mass. This would be better evaluated with a contrast-enhanced CT scan or, if the patient can tolerate, MRI with and without contrast. 2. Small bilateral pleural effusions.  Lower lobe atelectasis. 3. Distended gallbladder without convincing acute cholecystitis. 4. No abdominal abscess. 5. Chronic left adnexal cyst measuring 7.5 cm in greatest dimension, which may be ovarian in origin or an inclusion cyst. It is similar in size to the ultrasound dated 02/27/2012. 6. No acute bowel abnormality.   Electronically Signed   By: Lajean Manes M.D.   On: 09/18/2014 12:15   Dg Chest 2 View  09/15/2014   CLINICAL DATA:  Shortness of breath. Pain when breathing for 2 days.  EXAM: CHEST  2 VIEW  COMPARISON:  03/20/2014  FINDINGS: Heart size is mildly enlarged. Moderate hiatal hernia present. There are no focal consolidations. No pleural effusions. No pulmonary edema. Mild scoliosis.  IMPRESSION: 1. Cardiomegaly without edema. 2. Hiatal hernia.   Electronically Signed   By: Nolon Nations M.D.   On: 09/15/2014 11:31   Mr Liver Wo Contrast  09/21/2014   CLINICAL DATA:  Liver mass on CT  EXAM: MRI ABDOMEN WITHOUT CONTRAST  TECHNIQUE: Multiplanar multisequence MR imaging was performed without the administration of intravenous contrast.  COMPARISON:  CT abdomen pelvis dated 09/18/2014  FINDINGS: Severely motion degraded  images.  Lower chest: Small bilateral pleural effusions. Associated bilateral lower lobe opacities, likely atelectasis.  Hepatobiliary: Liver is grossly unremarkable. Specifically, no mass is seen. The CT abnormality is distinct from the right liver.  Layering gallstones versus gallbladder sludge (series 5/ image 23). No associated inflammatory changes.  No intrahepatic ductal dilatation. Common duct is mildly dilated with a suspected 5 mm distal CBD stone (series 5/ image 29).  Pancreas: Pancreatic atrophy. Pancreas is poorly evaluated due to motion degradation.  Spleen: Within normal limits.  Adrenals/Urinary Tract: Thickening of the left adrenal gland without discrete mass (series 5/image 26).  Right adrenal gland is incompletely visualized but mildly thickened (series 5/image 23).  2.0 x 2.5 cm well-circumscribed T2 hyperintense mass abutting the anterior aspect of the right adrenal gland and displacing the IVC anteriorly/laterally (series 5/image 20). It is unclear whether the mass is of primary adrenal origin or possibly originates along the posterior wall of the infrahepatic IVC. The  lesion is new from 2011 but is poorly characterized on this motion degraded unenhanced MRI. Primary differential considerations include pheochromocytoma or sarcoma. If this is adrenal in etiology, metastasis would also be in the differential in the setting of a known primary neoplasm.  3.0 cm left lower pole renal cyst (series 11/ image 33). Additional subcentimeter right lower pole renal cyst (series 11/ image 34). Bilateral renal scarring. No hydronephrosis.  Stomach/Bowel: Stomach is notable for a moderate to large hiatal hernia.  Visualized bowel is poorly evaluated/ grossly unremarkable.  Vascular/Lymphatic: No evidence of abdominal aortic aneurysm.  Infrahepatic IVC is effaced/displaced anteriorly, as described above.  No gross abdominal lymphadenopathy.  Other: No abdominal ascites.  Retroperitoneal standing.  Moderate  body wall edema.  Musculoskeletal: Degenerative changes of the visualized thoracolumbar spine.  IMPRESSION: Severely motion degraded images. Evaluation is also limited due to lack of intravenous contrast administration.  2.5 cm well-circumscribed T2 hyperintense mass located between the intrahepatic IVC and right adrenal gland, as described above, exact organ of origin unclear. Primary differential considerations include pheochromocytoma, sarcoma, or possibly metastasis.  Liver is grossly unremarkable.  Additional ancillary findings as above.   Electronically Signed   By: Julian Hy M.D.   On: 09/21/2014 08:20   US Renal  09/22/2014   CLINICAL DATA:  Acute renal failure  EXAM: RENAL / URINARY TRACT ULTRASOUND COMPLETE  COMPARISON:  09/18/2014  FINDINGS: Right Kidney:  Length: 10.8 cm. Echogenicity within normal limits. No mass or hydronephrosis visualized.  Left Kidney:  Length: 11.6 cm. A 2.9 cm cyst is noted in the upper pole similar to that seen on prior CT examination.  Bladder:  Decompressed by Foley catheter. Note is made again of a left adnexal cyst.  IMPRESSION: Left renal cyst.  No other focal renal abnormality is noted.   Electronically Signed   By: Inez Catalina M.D.   On: 09/22/2014 11:00   Dg Chest Port 1 View  09/21/2014   CLINICAL DATA:  Acute renal insufficiency, pulmonary embolism and infarction, atrial fibrillation, CHF.  EXAM: PORTABLE CHEST - 1 VIEW  COMPARISON:  Portable chest x-ray of September 17, 2014 and PA and lateral chest x-ray of August 05, 2013  FINDINGS: The lungs are mildly hypoinflated greater on the right than on the left. The cardiac silhouette is enlarged. The pulmonary vascularity is not engorged. A trace of pleural fluid may be present on the right. There is no alveolar infiltrate. There is density in the retrocardiac region which likely reflects a hiatal hernia given the appearance on the previous studies. The observed bony thorax is unremarkable.  IMPRESSION: There is  mild hypoinflation greater on the right than on the left. There is no acute cardiopulmonary abnormality.   Electronically Signed   By: David  Martinique M.D.   On: 09/21/2014 07:22   Dg Chest Port 1 View  09/17/2014   CLINICAL DATA:  Respiratory distress.  EXAM: PORTABLE CHEST - 1 VIEW  COMPARISON:  09/16/2014.  FINDINGS: Cardiac silhouette is mildly enlarged. Opacity in the retrocardiac region is likely moderate hiatal hernia. No hilar masses or adenopathy.  No lung consolidation or edema. No pleural effusion or pneumothorax.  Bony thorax is demineralized but grossly intact.  No significant change from the previous day's study.  IMPRESSION: No acute cardiopulmonary disease.   Electronically Signed   By: Lajean Manes M.D.   On: 09/17/2014 19:47   Dg Chest Port 1 View  09/16/2014   CLINICAL DATA:  Shortness of Breath  EXAM:  PORTABLE CHEST - 1 VIEW  COMPARISON:  September 15, 2014  FINDINGS: There is no edema or consolidation. Heart is upper normal in size with pulmonary vascularity within normal limits. Moderate hiatal hernia is present. There is atherosclerotic change in the aorta. No adenopathy. No bone lesions.  IMPRESSION: No edema or consolidation. Moderate hiatal hernia. Atherosclerotic calcification in aorta.   Electronically Signed   By: Lowella Grip III M.D.   On: 09/16/2014 11:20    Annalycia Done Cletis Media, MD 09/27/2014, 8:05 AM PGY-1, [facility] Intern pager: [phone], text pages welcome

## 2014-09-27 NOTE — Progress Notes (Signed)
[name], MD Physician Signed Physical Medicine and Rehabilitation Consult Note 09/22/2014 5:36 AM  Related encounter: ED to Hosp-Admission (Current) from 09/15/2014 in [facility]    Expand All Collapse All        Physical Medicine and Rehabilitation Consult Reason for Consult: Sepsis/multi-medical Referring Physician: Family medicine   HPI: Sara Russell is a 81 y.o. right handed female with history of hypertension, atrial fibrillation, pulmonary emboli/DVT maintained on Eliquis, systolic congestive heart failure. Independent with a walker prior to admission living alone. Patient had just signed contracts to enter [facility] independent living facility. Presented 09/15/2014 with increasing shortness of breath and dizziness on exertion. Denied chest pain or fever. Chest x-ray noted cardiomegaly left without effusion or pulmonary edema. Creatinine elevated 2.25 as well as hyponatremia 128. White blood cell count 12,100. Troponin mildly elevated 0.28. Urinalysis negative. Cardiology service is consulted for elevated troponins with echocardiogram showing ejection fraction of 50% as well as septal hypokinesis. Serial follow-up troponins placed on amiodarone for cardiac rate control. Patient spiked a fever 104.7 rectally and increasing respiratory distress 09/16/2014 with rapid response contacted and placed on BiPAP.. Maintained on broad-spectrum anti-biotics. Nephrology services consulted in light of elevated creatinine suspect acute renal failure secondary to ischemic ATN from bouts of hypotension as well as ACE inhibitor that was discontinued. CT abdomen and pelvis showed questionable liver abscess versus mass. MRI of the liver shows a 2.5 cm well circumscribed hyperintense mass located between the intrahepatic IVC and right adrenal gland. Primary differential considerations included pheochromocytoma, sarcoma or possibly metastasis with workup ongoing. Infectious  disease consulted suspect sepsis versus transient bacteremia and currently maintained on intravenous Merrem. Renal functions continued to improve latest creatinine 0.59. Physical therapy evaluation completed 09/21/2014 with noted deconditioning and recommendations of physical medicine rehabilitation consult.  Still on Neo-Synephrine drip Review of Systems  Constitutional: Positive for fever and chills.  Eyes: Negative for blurred vision and double vision.  Respiratory: Negative for cough.   Shortness of breath on exertion  Cardiovascular: Positive for palpitations. Negative for chest pain.  Gastrointestinal: Positive for nausea and constipation.   GERD  Genitourinary: Positive for dysuria and hematuria.  Musculoskeletal: Positive for myalgias and joint pain.  Skin: Negative for rash.  Neurological: Positive for dizziness and weakness. Negative for headaches.   Past Medical History  Diagnosis Date  . S/P dilatation of esophageal stricture 2002  . Hypertension   . Hyperlipidemia   . DVT (deep venous thrombosis) 2009  . PE (pulmonary embolism) 2009  . GERD (gastroesophageal reflux disease)   . Diverticulosis   . Iron deficiency anemia   . Hyperplastic colon polyp   . Positive H. pylori test 01/20/96  . Degenerative joint disease   . Skin cancer     h/o basal and squamous skin cancer--> removed  . Persistent atrial fibrillation   . Shortness of breath dyspnea   . AKI (acute kidney injury) 09/15/2014  . Colitis, ulcerative    Past Surgical History  Procedure Laterality Date  . Dilation and curettage of uterus      for endometrial polyps  . Replacement total knee      left  . Tonsillectomy    . Appendectomy    . Breast lumpectomy      benign  . Joint replacement     Family History  Problem Relation Age of Onset  . Esophageal cancer Brother   . Diverticulitis  Brother   . Colon cancer Neg Hx   .  Heart disease Mother   . Stroke Mother    Social History:  reports that she quit smoking about 53 years ago. Her smoking use included Cigarettes. She started smoking about 66 years ago. She has a 6.5 pack-year smoking history. She has never used smokeless tobacco. She reports that she drinks alcohol. She reports that she does not use illicit drugs. Allergies:  Allergies  Allergen Reactions  . Iohexol Other (See Comments)    unknown  . Ivp Dye [Iodinated Diagnostic Agents] Hives  . Scallops [Shellfish Allergy] Other (See Comments)    Projectile vomiting.  . Sulfa Antibiotics Hives  . Sulfamethoxazole Hives    REACTION: unspecified   Medications Prior to Admission  Medication Sig Dispense Refill  . acetaminophen (TYLENOL) 650 MG CR tablet Take 650 mg by mouth every 8 (eight) hours as needed for pain.    Marland Kitchen atorvastatin (LIPITOR) 10 MG tablet TAKE ONE TABLET BY MOUTH ONCE DAILY AT 6PM 90 tablet 3  . benazepril (LOTENSIN) 40 MG tablet TAKE ONE TABLET BY MOUTH ONCE DAILY 90 tablet 3  . calcium carbonate (TUMS - DOSED IN MG ELEMENTAL CALCIUM) 500 MG chewable tablet Chew 1 tablet by mouth 2 (two) times daily.     . calcium-vitamin D (SM CALCIUM-VITAMIN D) 500-200 MG-UNIT per tablet Take 1 tablet by mouth 2 (two) times daily.     Marland Kitchen diltiazem (CARDIZEM CD) 180 MG 24 hr capsule Take 1 capsule (180 mg total) by mouth daily. 90 capsule 3  . ELIQUIS 5 MG TABS tablet TAKE 1 TABLET TWICE A DAY 180 tablet 0  . ferrous sulfate 325 (65 FE) MG tablet Take 1 tablet (325 mg total) by mouth daily with breakfast. 1 tablet 0  . metoprolol (LOPRESSOR) 100 MG tablet TAKE ONE TABLET BY MOUTH TWICE DAILY 180 tablet 3  . omeprazole (PRILOSEC) 40 MG capsule TAKE ONE CAPSULE BY MOUTH ONCE DAILY 90 capsule 3  . traMADol (ULTRAM) 50 MG tablet TAKE ONE TABLET BY MOUTH EVERY 6 HOURS AS  NEEDED FOR PAIN 180 tablet 1    Home: Home Living Family/patient expects to be discharged to:: Private residence Living Arrangements: Alone Available Help at Discharge: Family, Available 24 hours/day (sons can help, pt has d/c'd to their homes in past prn) Type of Home: House Home Access: Stairs to enter [employer] of Steps: 4 Entrance Stairs-Rails: Can reach both, Left, Right Home Layout: Multi-level, 1/2 bath on main level, Bed/bath upstairs Alternate Level Stairs-Number of Steps: 5 Alternate Level Stairs-Rails: None Bathroom Shower/Tub: Gaffer, Door [employer] Toilet: Standard Bathroom Accessibility: Yes Home Equipment: Environmental consultant - 2 wheels, Bedside commode, Cane - single point Additional Comments: See comments section above for info about pt d/c plan.  Functional History: Prior Function Level of Independence: Independent Functional Status:  Mobility: Bed Mobility General bed mobility comments: in chair on arrival Transfers Overall transfer level: Needs assistance Equipment used: 2 person hand held assist Transfers: Sit to/from Stand Sit to Stand: Mod assist, +2 physical assistance General transfer comment: Pt used hands to power up but needed mod assist to physically stand all the way up.       ADL:    Cognition: Cognition Overall Cognitive Status: Within Functional Limits for tasks assessed Orientation Level: Oriented X4 Cognition Arousal/Alertness: Awake/alert Behavior During Therapy: WFL for tasks assessed/performed Overall Cognitive Status: Within Functional Limits for tasks assessed  Blood pressure 106/59, pulse 98, temperature 97.6 F (36.4 C), temperature source Axillary, resp. rate 0, height 5' 1"  (1.549 m), weight  68.1 kg (150 lb 2.1 oz), SpO2 96 %. Physical Exam  Constitutional: She is oriented to person, place, and time.  HENT:  Head: Normocephalic.  Eyes: EOM are normal.  Neck: Normal range of motion. Neck supple. No  thyromegaly present.  Cardiovascular:  Cardiac rate controlled  Respiratory: Effort normal and breath sounds normal. No respiratory distress.  GI: Soft. Bowel sounds are normal. She exhibits no distension. There is no tenderness.  Neurological: She is alert and oriented to person, place, and time.  Skin: Skin is warm and dry.  Motor strength is 4/5 bilateral deltoids, biceps, triceps, grip, hip flexor, knee extensor, ankle dorsiflexor and plantar flexor Sensation is intact to light touch in bilateral upper and lower limbs Mood and affect are appropriate There is no evidence of dysarthria Coordination appears normal bilateral upper and lower limbs   Lab Results Last 24 Hours    Results for orders placed or performed during the hospital encounter of 09/15/14 (from the past 24 hour(s))  Renal function panel Status: Abnormal   Collection Time: 09/22/14 3:20 AM  Result Value Ref Range   Sodium 132 (L) 135 - 145 mmol/L   Potassium 3.8 3.5 - 5.1 mmol/L   Chloride 101 101 - 111 mmol/L   CO2 16 (L) 22 - 32 mmol/L   Glucose, Bld 76 65 - 99 mg/dL   BUN 47 (H) 6 - 20 mg/dL   Creatinine, Ser 4.17 (H) 0.44 - 1.00 mg/dL   Calcium 7.6 (L) 8.9 - 10.3 mg/dL   Phosphorus 5.2 (H) 2.5 - 4.6 mg/dL   Albumin 1.6 (L) 3.5 - 5.0 g/dL   GFR calc non Af Amer 9 (L) >60 mL/min   GFR calc Af Amer 11 (L) >60 mL/min   Anion gap 15 5 - 15  CBC Status: Abnormal (Preliminary result)   Collection Time: 09/22/14 3:20 AM  Result Value Ref Range   WBC PENDING 4.0 - 10.5 K/uL   RBC 4.15 3.87 - 5.11 MIL/uL   Hemoglobin 12.1 12.0 - 15.0 g/dL   HCT 35.1 (L) 36.0 - 46.0 %   MCV 84.6 78.0 - 100.0 fL   MCH 29.2 26.0 - 34.0 pg   MCHC 34.5 30.0 - 36.0 g/dL   RDW 14.4 11.5 - 15.5 %   Platelets PENDING 150 - 400 K/uL  Basic metabolic panel Status: Abnormal   Collection Time: 09/22/14 3:20 AM  Result  Value Ref Range   Sodium 132 (L) 135 - 145 mmol/L   Potassium 4.1 3.5 - 5.1 mmol/L   Chloride 101 101 - 111 mmol/L   CO2 15 (L) 22 - 32 mmol/L   Glucose, Bld 73 65 - 99 mg/dL   BUN 46 (H) 6 - 20 mg/dL   Creatinine, Ser 4.09 (H) 0.44 - 1.00 mg/dL   Calcium 7.6 (L) 8.9 - 10.3 mg/dL   GFR calc non Af Amer 9 (L) >60 mL/min   GFR calc Af Amer 11 (L) >60 mL/min   Anion gap 16 (H) 5 - 15  Magnesium Status: None   Collection Time: 09/22/14 3:20 AM  Result Value Ref Range   Magnesium 1.8 1.7 - 2.4 mg/dL  Phosphorus Status: Abnormal   Collection Time: 09/22/14 3:20 AM  Result Value Ref Range   Phosphorus 5.2 (H) 2.5 - 4.6 mg/dL      Imaging Results (Last 48 hours)    Mr Liver Wo Contrast  09/21/2014 CLINICAL DATA: Liver mass on CT EXAM: MRI ABDOMEN WITHOUT CONTRAST TECHNIQUE: Multiplanar multisequence  MR imaging was performed without the administration of intravenous contrast. COMPARISON: CT abdomen pelvis dated 09/18/2014 FINDINGS: Severely motion degraded images. Lower chest: Small bilateral pleural effusions. Associated bilateral lower lobe opacities, likely atelectasis. Hepatobiliary: Liver is grossly unremarkable. Specifically, no mass is seen. The CT abnormality is distinct from the right liver. Layering gallstones versus gallbladder sludge (series 5/ image 23). No associated inflammatory changes. No intrahepatic ductal dilatation. Common duct is mildly dilated with a suspected 5 mm distal CBD stone (series 5/ image 29). Pancreas: Pancreatic atrophy. Pancreas is poorly evaluated due to motion degradation. Spleen: Within normal limits. Adrenals/Urinary Tract: Thickening of the left adrenal gland without discrete mass (series 5/image 26). Right adrenal gland is incompletely visualized but mildly thickened (series 5/image 23). 2.0 x 2.5 cm well-circumscribed T2 hyperintense mass abutting the anterior aspect  of the right adrenal gland and displacing the IVC anteriorly/laterally (series 5/image 20). It is unclear whether the mass is of primary adrenal origin or possibly originates along the posterior wall of the infrahepatic IVC. The lesion is new from 2011 but is poorly characterized on this motion degraded unenhanced MRI. Primary differential considerations include pheochromocytoma or sarcoma. If this is adrenal in etiology, metastasis would also be in the differential in the setting of a known primary neoplasm. 3.0 cm left lower pole renal cyst (series 11/ image 33). Additional subcentimeter right lower pole renal cyst (series 11/ image 34). Bilateral renal scarring. No hydronephrosis. Stomach/Bowel: Stomach is notable for a moderate to large hiatal hernia. Visualized bowel is poorly evaluated/ grossly unremarkable. Vascular/Lymphatic: No evidence of abdominal aortic aneurysm. Infrahepatic IVC is effaced/displaced anteriorly, as described above. No gross abdominal lymphadenopathy. Other: No abdominal ascites. Retroperitoneal standing. Moderate body wall edema. Musculoskeletal: Degenerative changes of the visualized thoracolumbar spine. IMPRESSION: Severely motion degraded images. Evaluation is also limited due to lack of intravenous contrast administration. 2.5 cm well-circumscribed T2 hyperintense mass located between the intrahepatic IVC and right adrenal gland, as described above, exact organ of origin unclear. Primary differential considerations include pheochromocytoma, sarcoma, or possibly metastasis. Liver is grossly unremarkable. Additional ancillary findings as above. Electronically Signed By: Julian Hy M.D. On: 09/21/2014 08:20   Dg Chest Port 1 View  09/21/2014 CLINICAL DATA: Acute renal insufficiency, pulmonary embolism and infarction, atrial fibrillation, CHF. EXAM: PORTABLE CHEST - 1 VIEW COMPARISON: Portable chest x-ray of September 17, 2014 and PA and lateral chest x-ray  of August 05, 2013 FINDINGS: The lungs are mildly hypoinflated greater on the right than on the left. The cardiac silhouette is enlarged. The pulmonary vascularity is not engorged. A trace of pleural fluid may be present on the right. There is no alveolar infiltrate. There is density in the retrocardiac region which likely reflects a hiatal hernia given the appearance on the previous studies. The observed bony thorax is unremarkable. IMPRESSION: There is mild hypoinflation greater on the right than on the left. There is no acute cardiopulmonary abnormality. Electronically Signed By: David Martinique M.D. On: 09/21/2014 07:22     Assessment/Plan: Diagnosis: Deconditioning related to sepsis with acute renal failure 1. Does the need for close, 24 hr/day medical supervision in concert with the patient's rehab needs make it unreasonable for this patient to be served in a less intensive setting? Yes 2. Co-Morbidities requiring supervision/potential complications: Atrial fibrillation, congestive heart failure, history of hypertension however now is hypotensive 3. Due to bladder management, bowel management, safety, skin/wound care, disease management, medication administration, pain management and patient education, does the patient require 24 hr/day rehab nursing? Yes  4. Does the patient require coordinated care of a physician, rehab nurse, PT (1-2 hrs/day, 5 days/week) and OT (1-2 hrs/day, 5 days/week) to address physical and functional deficits in the context of the above medical diagnosis(es)? Yes Addressing deficits in the following areas: balance, endurance, locomotion, strength, transferring, bowel/bladder control, bathing, dressing, feeding, grooming and toileting 5. Can the patient actively participate in an intensive therapy program of at least 3 hrs of therapy per day at least 5 days per week? No 6. The potential for patient to make measurable gains while on inpatient rehab is good once medically  stable 7. Anticipated functional outcomes upon discharge from inpatient rehab are supervision with PT, supervision with OT, n/a with SLP. 8. Estimated rehab length of stay to reach the above functional goals is: 10-14 days 9. Does the patient have adequate social supports and living environment to accommodate these discharge functional goals? Potentially 10. Anticipated D/C setting: Home 11. Anticipated post D/C treatments: [facility] therapy 12. Overall Rehab/Functional Prognosis: good  RECOMMENDATIONS: This patient's condition is appropriate for continued rehabilitative care in the following setting: CIR once off IV pressors with stable renal function Patient has agreed to participate in recommended program. Potentially Note that insurance prior authorization may be required for reimbursement for recommended care.  Comment:     09/22/2014       Revision History     Date/Time User Provider Type Action   09/22/2014 4:48 PM [name], MD Physician Sign   09/22/2014 6:18 AM Cathlyn Parsons, PA-C Physician Assistant Pend   View Details Report       Routing History     Date/Time From To Method   09/22/2014 4:48 PM [name], MD [name], MD In Basket   09/22/2014 4:48 PM [name], MD Zenia Resides, MD In [place]

## 2014-09-27 NOTE — Discharge Summary (Signed)
[facility] Discharge Summary  Patient name: Sara Russell Medical record number: [id number] Date of birth: 14-Sep-1930 Age: 81 y.o. Gender: female Date of Admission: 09/15/2014  Date of Discharge: 09/27/2014 Admitting Physician: Kinnie Feil, MD  Primary Care Provider: Zigmund Gottron, MD Consultants: Nephrology, Cardiology, CCM   Indication for Hospitalization: Septic Shock   Discharge Diagnoses/Problem List:  Septic Shock  Acute Respiratory Shock  Liver mass Acute renal failure  Acute hypoxic failure  Chronic Afib  With RVR  HTN  HLD  Hx of PE   Disposition: CIR   Discharge Condition: Good, Stable   Discharge Exam:  General: NAD, patient sitting up on the side of the bed  Cardiovascular: irregular rhythm,, radial pulses 2+  Respiratory: CTAB, normal effort,  Abdomen: BS+, NTND  Extremities: No LE edema.  Brief Hospital Course:  - On 8/4 patient was admitted for  SOB and Dizziness. She was found to have mild leukocytosis and UA significant for moderate leukocytosis. Blood cultures and urine cultures were drawn on admission. CXR was negative for acute pulmonary process. Patient was started on Ceftriaxone   - On 8/5, patient acutely decompensated, requiring BiPAP due significant desaturations, with fevers of 104. Broad spectrum antibiotics were started including vancomycin and zosyn. Patient was hypotensive requiring fluids. Patient's rate control medications were held due to hypotension. Patient developed RVR in the setting of chronic Afib. Patient was transferred to CCM due to continued lability. Cardiology started patient on Amiodarone drip for rate control and critical care team started patient on phenylephrine drip for continued hypotension.  Troponin were slightly elevated, and therefore an ECHO was completed showing EF 45-50% with septal hypokinesis, mild to moderate RV dilation with mildly decreased systolic function, mild to  moderate pulmonary hypertension, ultimately ruling out the possibility of cardiogenic shock. Consider slight troponin elevation due to demand ischemia  -8/6 Patient was hypothermic, continuing to require pressors with continued elevation of Lactis acid and elevated procaltinion with multiple organ system failure.  8/7 - Patient continued to hypotensive. CT abdomen was performed and mass between liver and adrenal was noted, with the though this was likely an abscess. Blood and Urine cultures remained negative. Zosyn was d/c and patient was continued on Aztreonam till the 8/10 with vancomycin per ID.  8/8 - Patient now with acute oliguric renal failure, putting out only about 200 ml of over 24 hours. Serum Creatinine trending steadily up with siginfcant fluid overload.  8/9- Further delination of mass found on CT, with MRI showing a mass between intrahepatic IVC and right adrenal, not an abscess. Patient refusing biopsy as she is not interested in surgery or chemotherapy  8/10 ID considering the possibility of a transient bacteria released into bloodstream from malignancy adjacent to her IVC. However due to lack of clear treatable source of infection, with the abscene of positive blood or urine cultures d/c all Antibotics. Continued acute kidney injury followed by nephrology, diuresing  patient 8/11 - Patient off pressors and amiodarone drip. Palliative care consulted, patient interested in having medical care, and will in the future be interested in hospice. PT/OT consulted recommending in patient rehab  8/12 Patient transferred back to [facility] Medicine teaching service. Nephrology continuing to following kidney injury, and provide diuresis  8/13 - 8/16 Patient stable, restarted Eliquis for chronic Afib. Cardiology continuing to follow, increasing Coreg for better rate control, and nephrology monitor SCr and diuresis. Transferred to PO lasix today.   Issues for Follow Up:  1. Will need  to continue to  monitor SCr, K+  - Patient currently getting KDUR 40 meq once a day, adjust as needed  2. Will need to continue to monitor heart rate and blood pressure. Currently stable  3. Eliquis per pharmacy, may need to change to Coumadin if Kidney function does not improve   Significant Procedures: None  Significant Labs and Imaging:   Recent Labs Lab 09/25/14 0305 09/26/14 0323 09/27/14 0341  WBC 7.6 10.7* 6.5  HGB 11.0* 11.1* 10.4*  HCT 31.5* 32.3* 30.8*  PLT 109* 123* 134*    Recent Labs Lab 09/22/14 0320 09/23/14 0228 09/24/14 0235 09/25/14 0305 09/26/14 0323 09/27/14 0341  NA 132*  132* 133* 133* 133* 134* 133*  K 4.1  3.8 3.4* 3.3* 3.3* 3.3* 4.1  CL 101  101 98* 97* 96* 96* 95*  CO2 15*  16* 21* 21* 25 24 27   GLUCOSE 73  76 97 96 91 92 94  BUN 46*  47* 50* 55* 55* 58* 61*  CREATININE 4.09*  4.17* 4.31* 4.46* 4.34* 4.37* 4.45*  CALCIUM 7.6*  7.6* 7.5* 7.7* 7.7* 7.8* 7.8*  MG 1.8 1.7  --   --   --   --   PHOS 5.2*  5.2* 5.2*  --   --   --   --   ALBUMIN 1.6*  --   --   --   --   --    Ct Abdomen Pelvis Wo Contrast  09/18/2014   CLINICAL DATA:  SepsisHx basal and squamous cell ca., diverticulosis, GERDSurgery's; D&C, tonsillectomy, appendectomy, breast lumpectomy(benign)  EXAM: CT ABDOMEN AND PELVIS WITHOUT CONTRAST  TECHNIQUE: Multidetector CT imaging of the abdomen and pelvis was performed following the standard protocol without IV contrast.  COMPARISON:  Pelvic ultrasound, 02/27/2012.  FINDINGS: Lung bases: Small pleural effusions. Lower lobe opacity most consistent with atelectasis. Component of pneumonia is possible but felt likely. 5 mm nodule right lower lobe near the oblique fissure. No pulmonary edema. Heart mildly enlarged. Moderate size hiatal hernia.  Liver: 4.5 cm abnormality, which may reflect a mass, which lies along the posterior margin of the liver at the base caudate lobe and is inseparable from the the inferior vena cava. It also abuts the right adrenal  gland. Longitudinal extent of this abnormality is approximately 6 cm.  Liver otherwise unremarkable.  Spleen: Unremarkable.  Gallbladder: Distended. No radiopaque stone. No wall thickening or adjacent inflammation. Common bile duct is dilated to approximate 1 cm but shows normal distal tapering.  Adrenal glands. The left adrenal gland thickening consistent with nodular hyperplasia. Right adrenal gland abuts the above described lesion. Is otherwise unremarkable.  Kidneys, ureters, bladder: 3.2 cm low-density lesion in the midpole of the left kidney consistent with cysts. No other renal masses. Bilateral renal cortical thinning. No stones. Bilateral perinephric stranding, likely chronic. Left ureter is dilated. There is mild prominence of the left renal pelvis without calyx dilation. No ureteral stone. Normal right ureter. Bladder is decompressed by a Foley catheter.  Uterus and adnexa: Uterus is unremarkable. There is a left adnexal water density mass measuring 6.7 x 6.1 x 7.5 cm. This was present on the prior ultrasound and is unchanged in overall size. This could be ovarian origin. It may reflect an inclusion cyst. It stability since the 2014 ultrasound strongly supports a benign etiology.  Lymph nodes:  No enlarged lymph nodes.  Ascites:  None.  Gastrointestinal: No bowel dilation to suggest obstruction. No bowel wall thickening or mesenteric inflammation.  Musculoskeletal:  Degenerative changes most evident at L2-L3. Bony structures are demineralized. No osteoblastic or osteolytic lesions.  IMPRESSION: 1. Heterogeneous masslike abnormality along the posterior margin of the liver, centered on the inferior vena cava, from which it is inseparable. It abuts the right adrenal gland. This may have an origin from the posterior margin of the liver or from the inferior vena cava. Adrenal origin is felt unlikely. This could be a neoplastic mass. This would be better evaluated with a contrast-enhanced CT scan or, if the  patient can tolerate, MRI with and without contrast. 2. Small bilateral pleural effusions.  Lower lobe atelectasis. 3. Distended gallbladder without convincing acute cholecystitis. 4. No abdominal abscess. 5. Chronic left adnexal cyst measuring 7.5 cm in greatest dimension, which may be ovarian in origin or an inclusion cyst. It is similar in size to the ultrasound dated 02/27/2012. 6. No acute bowel abnormality.   Electronically Signed   By: Lajean Manes M.D.   On: 09/18/2014 12:15   Dg Chest 2 View  09/15/2014   CLINICAL DATA:  Shortness of breath. Pain when breathing for 2 days.  EXAM: CHEST  2 VIEW  COMPARISON:  03/20/2014  FINDINGS: Heart size is mildly enlarged. Moderate hiatal hernia present. There are no focal consolidations. No pleural effusions. No pulmonary edema. Mild scoliosis.  IMPRESSION: 1. Cardiomegaly without edema. 2. Hiatal hernia.   Electronically Signed   By: Nolon Nations M.D.   On: 09/15/2014 11:31   Mr Liver Wo Contrast  09/21/2014   CLINICAL DATA:  Liver mass on CT  EXAM: MRI ABDOMEN WITHOUT CONTRAST  TECHNIQUE: Multiplanar multisequence MR imaging was performed without the administration of intravenous contrast.  COMPARISON:  CT abdomen pelvis dated 09/18/2014  FINDINGS: Severely motion degraded images.  Lower chest: Small bilateral pleural effusions. Associated bilateral lower lobe opacities, likely atelectasis.  Hepatobiliary: Liver is grossly unremarkable. Specifically, no mass is seen. The CT abnormality is distinct from the right liver.  Layering gallstones versus gallbladder sludge (series 5/ image 23). No associated inflammatory changes.  No intrahepatic ductal dilatation. Common duct is mildly dilated with a suspected 5 mm distal CBD stone (series 5/ image 29).  Pancreas: Pancreatic atrophy. Pancreas is poorly evaluated due to motion degradation.  Spleen: Within normal limits.  Adrenals/Urinary Tract: Thickening of the left adrenal gland without discrete mass (series  5/image 26).  Right adrenal gland is incompletely visualized but mildly thickened (series 5/image 23).  2.0 x 2.5 cm well-circumscribed T2 hyperintense mass abutting the anterior aspect of the right adrenal gland and displacing the IVC anteriorly/laterally (series 5/image 20). It is unclear whether the mass is of primary adrenal origin or possibly originates along the posterior wall of the infrahepatic IVC. The lesion is new from 2011 but is poorly characterized on this motion degraded unenhanced MRI. Primary differential considerations include pheochromocytoma or sarcoma. If this is adrenal in etiology, metastasis would also be in the differential in the setting of a known primary neoplasm.  3.0 cm left lower pole renal cyst (series 11/ image 33). Additional subcentimeter right lower pole renal cyst (series 11/ image 34). Bilateral renal scarring. No hydronephrosis.  Stomach/Bowel: Stomach is notable for a moderate to large hiatal hernia.  Visualized bowel is poorly evaluated/ grossly unremarkable.  Vascular/Lymphatic: No evidence of abdominal aortic aneurysm.  Infrahepatic IVC is effaced/displaced anteriorly, as described above.  No gross abdominal lymphadenopathy.  Other: No abdominal ascites.  Retroperitoneal standing.  Moderate body wall edema.  Musculoskeletal: Degenerative changes of the visualized thoracolumbar spine.  IMPRESSION: Severely motion degraded images. Evaluation is also limited due to lack of intravenous contrast administration.  2.5 cm well-circumscribed T2 hyperintense mass located between the intrahepatic IVC and right adrenal gland, as described above, exact organ of origin unclear. Primary differential considerations include pheochromocytoma, sarcoma, or possibly metastasis.  Liver is grossly unremarkable.  Additional ancillary findings as above.   Electronically Signed   By: Julian Hy M.D.   On: 09/21/2014 08:20   US Renal  09/22/2014   CLINICAL DATA:  Acute renal failure  EXAM:  RENAL / URINARY TRACT ULTRASOUND COMPLETE  COMPARISON:  09/18/2014  FINDINGS: Right Kidney:  Length: 10.8 cm. Echogenicity within normal limits. No mass or hydronephrosis visualized.  Left Kidney:  Length: 11.6 cm. A 2.9 cm cyst is noted in the upper pole similar to that seen on prior CT examination.  Bladder:  Decompressed by Foley catheter. Note is made again of a left adnexal cyst.  IMPRESSION: Left renal cyst.  No other focal renal abnormality is noted.   Electronically Signed   By: Inez Catalina M.D.   On: 09/22/2014 11:00   Dg Chest Port 1 View  09/21/2014   CLINICAL DATA:  Acute renal insufficiency, pulmonary embolism and infarction, atrial fibrillation, CHF.  EXAM: PORTABLE CHEST - 1 VIEW  COMPARISON:  Portable chest x-ray of September 17, 2014 and PA and lateral chest x-ray of August 05, 2013  FINDINGS: The lungs are mildly hypoinflated greater on the right than on the left. The cardiac silhouette is enlarged. The pulmonary vascularity is not engorged. A trace of pleural fluid may be present on the right. There is no alveolar infiltrate. There is density in the retrocardiac region which likely reflects a hiatal hernia given the appearance on the previous studies. The observed bony thorax is unremarkable.  IMPRESSION: There is mild hypoinflation greater on the right than on the left. There is no acute cardiopulmonary abnormality.   Electronically Signed   By: David  Martinique M.D.   On: 09/21/2014 07:22   Dg Chest Port 1 View  09/17/2014   CLINICAL DATA:  Respiratory distress.  EXAM: PORTABLE CHEST - 1 VIEW  COMPARISON:  09/16/2014.  FINDINGS: Cardiac silhouette is mildly enlarged. Opacity in the retrocardiac region is likely moderate hiatal hernia. No hilar masses or adenopathy.  No lung consolidation or edema. No pleural effusion or pneumothorax.  Bony thorax is demineralized but grossly intact.  No significant change from the previous day's study.  IMPRESSION: No acute cardiopulmonary disease.   Electronically  Signed   By: Lajean Manes M.D.   On: 09/17/2014 19:47   Dg Chest Port 1 View  09/16/2014   CLINICAL DATA:  Shortness of Breath  EXAM: PORTABLE CHEST - 1 VIEW  COMPARISON:  September 15, 2014  FINDINGS: There is no edema or consolidation. Heart is upper normal in size with pulmonary vascularity within normal limits. Moderate hiatal hernia is present. There is atherosclerotic change in the aorta. No adenopathy. No bone lesions.  IMPRESSION: No edema or consolidation. Moderate hiatal hernia. Atherosclerotic calcification in aorta.   Electronically Signed   By: Lowella Grip III M.D.   On: 09/16/2014 11:20    Results/Tests Pending at Time of Discharge:   Discharge Medications:    Medication List    STOP taking these medications        benazepril 40 MG tablet  Commonly known as:  LOTENSIN     calcium carbonate 500 MG chewable tablet  Commonly known as:  TUMS -  dosed in mg elemental calcium     diltiazem 180 MG 24 hr capsule  Commonly known as:  CARDIZEM CD     metoprolol 100 MG tablet  Commonly known as:  LOPRESSOR      TAKE these medications        acetaminophen 650 MG CR tablet  Commonly known as:  TYLENOL  Take 650 mg by mouth every 8 (eight) hours as needed for pain.     amiodarone 200 MG tablet  Commonly known as:  PACERONE  Take 1 tablet (200 mg total) by mouth daily.     apixaban 2.5 MG Tabs tablet  Commonly known as:  ELIQUIS  Take 1 tablet (2.5 mg total) by mouth 2 (two) times daily.     atorvastatin 10 MG tablet  Commonly known as:  LIPITOR  TAKE ONE TABLET BY MOUTH ONCE DAILY AT  6PM     carvedilol 6.25 MG tablet  Commonly known as:  COREG  Take 1 tablet (6.25 mg total) by mouth 2 (two) times daily with a meal.     feeding supplement (NEPRO CARB STEADY) Liqd  Take 237 mLs by mouth 3 (three) times daily between meals.     ferrous sulfate 325 (65 FE) MG tablet  Take 1 tablet (325 mg total) by mouth daily with breakfast.     furosemide 80 MG tablet  Commonly  known as:  LASIX  Take 1 tablet (80 mg total) by mouth 2 (two) times daily.     omeprazole 40 MG capsule  Commonly known as:  PRILOSEC  TAKE ONE CAPSULE BY MOUTH ONCE DAILY     potassium chloride SA 20 MEQ tablet  Commonly known as:  K-DUR,KLOR-CON  Take 2 tablets (40 mEq total) by mouth daily.  Start taking on:  09/28/2014     SM CALCIUM-VITAMIN D 500-200 MG-UNIT per tablet  Generic drug:  calcium-vitamin D  Take 1 tablet by mouth 2 (two) times daily.     traMADol 50 MG tablet  Commonly known as:  ULTRAM  TAKE ONE TABLET BY MOUTH EVERY 6 HOURS AS NEEDED FOR PAIN        Discharge Instructions: Please refer to Patient Instructions section of EMR for full details.  Patient was counseled important signs and symptoms that should prompt return to medical care, changes in medications, dietary instructions, activity restrictions, and follow up appointments.   Follow-Up Appointments: Follow-up Information    Schedule an appointment as soon as possible for a visit with Zigmund Gottron, MD.   Specialty:  Family Medicine   Why:  Hospital follow-up   Contact information:   [place] [address] [phone]       Tonette Bihari, MD 09/27/2014, 11:58 AM PGY-1, [place]

## 2014-09-27 NOTE — Progress Notes (Signed)
Pt with Foley in place. Pt educated on importance of removing foley to prevent UTI. Pt states she has a foley because she is on IV lasix and she will have the foley removed if the doctor agrees to it in the morning.

## 2014-09-27 NOTE — Progress Notes (Signed)
Rehab admissions - I spoke with attending resident.  Patient medically cleared for acute inpatient rehab admission.  Bed available and will admit to inpatient rehab today.  Call me for questions.  #[phone]

## 2014-09-27 NOTE — Progress Notes (Signed)
Pt has orders to be discharged to [facility]. Report called to [facility] Discharge instructions given and pt has no additional questions at this time. Medication regimen reviewed and pt educated. Pt verbalized understanding and has no additional questions. . IV removed and site in good condition. Pt stable and waiting for transportation.   Maurene Capes RN

## 2014-09-27 NOTE — Progress Notes (Signed)
Subjective: Interval History: Her son is present at bedside. She reports she feels well. We explained to her that we do not believe her mass is affecting her renal function.   Objective: Vital signs in last 24 hours: Temp:  [97.9 F (36.6 C)-98.2 F (36.8 C)] 97.9 F (36.6 C) (08/16 0642) Pulse Rate:  [92-119] 119 (08/16 1333) Resp:  [18-20] 18 (08/16 1333) BP: (92-137)/(57-113) 95/57 mmHg (08/16 1333) SpO2:  [93 %-95 %] 95 % (08/16 1333) Weight:  [158 lb 4.8 oz (71.804 kg)-159 lb 9.6 oz (72.394 kg)] 159 lb 9.6 oz (72.394 kg) (08/16 1333) Weight change:   Intake/Output from previous day:   Intake/Output this shift: Total I/O In: 120 [P.O.:120] Out: 450 [Urine:450]  General: Elderly Caucasian female, resting in bed, NAD Cardiac: RRR, no rubs, murmurs or gallops Pulm: clear to auscultation bilaterally, no wheezes, rales, or rhonchi Ext: warm and well perfused, no pedal edema, right AV fistula with palpable thrill Neuro: responds to questions appropriately; moving all extremities freely  Lab Results:  Recent Labs  09/26/14 0323 09/27/14 0341  WBC 10.7* 6.5  HGB 11.1* 10.4*  HCT 32.3* 30.8*  PLT 123* 134*   BMET:  Recent Labs  09/26/14 0323 09/27/14 0341  NA 134* 133*  K 3.3* 4.1  CL 96* 95*  CO2 24 27  GLUCOSE 92 94  BUN 58* 61*  CREATININE 4.37* 4.45*  CALCIUM 7.8* 7.8*   I have reviewed the patient's current medications. Scheduled: . apixaban  2.5 mg Oral BID  . triamcinolone cream   Topical BID   Assessment/Plan: #1 acute kidney injury: Likely secondary to ischemic acute tubular necrosis from ACE inhibitor use. Urine output remains well. Euvolemic on physical exam. Will decrease Lasix from 160 mg twice daily to 80 mg twice daily and decrease potassium supplementation to 30 mEq accordingly. Continue monitoring labs and following along.   Charlott Rakes 09/27/2014,3:17 PM   Renal Attending: Agree with note and plans as articulated above. Larena Ohnemus  C

## 2014-09-28 ENCOUNTER — Inpatient Hospital Stay (HOSPITAL_COMMUNITY): Payer: Medicare Other | Admitting: Rehabilitation

## 2014-09-28 ENCOUNTER — Inpatient Hospital Stay (HOSPITAL_COMMUNITY): Payer: Medicare Other | Admitting: *Deleted

## 2014-09-28 ENCOUNTER — Inpatient Hospital Stay (HOSPITAL_COMMUNITY): Payer: Medicare Other | Admitting: Occupational Therapy

## 2014-09-28 DIAGNOSIS — I4891 Unspecified atrial fibrillation: Secondary | ICD-10-CM

## 2014-09-28 LAB — COMPREHENSIVE METABOLIC PANEL
ALT: 10 U/L — ABNORMAL LOW (ref 14–54)
AST: 20 U/L (ref 15–41)
Albumin: 2 g/dL — ABNORMAL LOW (ref 3.5–5.0)
Alkaline Phosphatase: 115 U/L (ref 38–126)
Anion gap: 11 (ref 5–15)
BUN: 62 mg/dL — ABNORMAL HIGH (ref 6–20)
CO2: 28 mmol/L (ref 22–32)
Calcium: 7.9 mg/dL — ABNORMAL LOW (ref 8.9–10.3)
Chloride: 93 mmol/L — ABNORMAL LOW (ref 101–111)
Creatinine, Ser: 4.33 mg/dL — ABNORMAL HIGH (ref 0.44–1.00)
GFR calc Af Amer: 10 mL/min — ABNORMAL LOW (ref >60)
GFR calc non Af Amer: 9 mL/min — ABNORMAL LOW (ref >60)
Glucose, Bld: 100 mg/dL — ABNORMAL HIGH (ref 65–99)
Potassium: 3.7 mmol/L (ref 3.5–5.1)
Sodium: 132 mmol/L — ABNORMAL LOW (ref 135–145)
Total Bilirubin: 1.1 mg/dL (ref 0.3–1.2)
Total Protein: 5.4 g/dL — ABNORMAL LOW (ref 6.5–8.1)

## 2014-09-28 MED ORDER — PANTOPRAZOLE SODIUM 40 MG PO TBEC
40.0000 mg | DELAYED_RELEASE_TABLET | Freq: Every day | ORAL | Status: DC
  Administered 2014-09-28 – 2014-10-07 (×10): 40 mg via ORAL
  Filled 2014-09-28 (×10): qty 1

## 2014-09-28 MED ORDER — NYSTATIN 100000 UNIT/GM EX POWD
Freq: Two times a day (BID) | CUTANEOUS | Status: DC
  Administered 2014-09-28 – 2014-10-01 (×8): via TOPICAL
  Administered 2014-10-02: 1 via TOPICAL
  Administered 2014-10-02 – 2014-10-06 (×7): via TOPICAL
  Filled 2014-09-28 (×3): qty 15

## 2014-09-28 MED ORDER — POLYSACCHARIDE IRON COMPLEX 150 MG PO CAPS
150.0000 mg | ORAL_CAPSULE | Freq: Every day | ORAL | Status: DC
  Administered 2014-09-28 – 2014-10-06 (×9): 150 mg via ORAL
  Filled 2014-09-28 (×9): qty 1

## 2014-09-28 MED ORDER — CALCIUM POLYCARBOPHIL 625 MG PO TABS
625.0000 mg | ORAL_TABLET | Freq: Two times a day (BID) | ORAL | Status: DC
  Administered 2014-09-28 – 2014-10-07 (×18): 625 mg via ORAL
  Filled 2014-09-28 (×22): qty 1

## 2014-09-28 MED ORDER — FUROSEMIDE 40 MG PO TABS
80.0000 mg | ORAL_TABLET | Freq: Two times a day (BID) | ORAL | Status: DC
  Administered 2014-09-28 – 2014-09-30 (×5): 80 mg via ORAL
  Filled 2014-09-28 (×5): qty 2

## 2014-09-28 MED ORDER — ATORVASTATIN CALCIUM 10 MG PO TABS
10.0000 mg | ORAL_TABLET | Freq: Every day | ORAL | Status: DC
  Administered 2014-09-28 – 2014-10-06 (×9): 10 mg via ORAL
  Filled 2014-09-28 (×9): qty 1

## 2014-09-28 MED ORDER — AMIODARONE HCL 200 MG PO TABS
200.0000 mg | ORAL_TABLET | Freq: Every day | ORAL | Status: DC
  Administered 2014-09-28 – 2014-10-07 (×10): 200 mg via ORAL
  Filled 2014-09-28 (×10): qty 1

## 2014-09-28 MED ORDER — CARVEDILOL 6.25 MG PO TABS
6.2500 mg | ORAL_TABLET | Freq: Two times a day (BID) | ORAL | Status: DC
  Administered 2014-09-28 – 2014-10-07 (×19): 6.25 mg via ORAL
  Filled 2014-09-28 (×19): qty 1

## 2014-09-28 MED ORDER — NEPRO/CARBSTEADY PO LIQD
237.0000 mL | Freq: Three times a day (TID) | ORAL | Status: DC
  Administered 2014-09-28 – 2014-10-07 (×26): 237 mL via ORAL

## 2014-09-28 MED ORDER — POTASSIUM CHLORIDE CRYS ER 10 MEQ PO TBCR
10.0000 meq | EXTENDED_RELEASE_TABLET | Freq: Two times a day (BID) | ORAL | Status: DC
  Administered 2014-09-28 – 2014-09-30 (×6): 10 meq via ORAL
  Filled 2014-09-28 (×6): qty 1

## 2014-09-28 NOTE — Evaluation (Signed)
Physical Therapy Assessment and Plan  Patient Details  Name: Sara Russell MRN: [id number] Date of Birth: March 16, 1930  PT Diagnosis: Abnormality of gait, Difficulty walking and Muscle weakness Rehab Potential: Excellent ELOS: 7-10 days   Today's Date: 09/28/2014 PT Individual Time: 0900-1028 PT Individual Time Calculation (min): 88 min    Problem List:  Patient Active Problem List   Diagnosis Date Noted  . Physical debility 09/27/2014  . Respiratory distress   . Encounter for palliative care   . ARF (acute renal failure)   . Sepsis   . FUO (fever of unknown origin)   . Liver mass, left lobe   . Septic shock   . AKI (acute kidney injury) 09/15/2014  . SOB (shortness of breath) 09/15/2014  . Elevated troponin   . Chronic diastolic congestive heart failure   . Elevated WBC count   . Urinary tract infectious disease   . Blood poisoning   . Osteopenia of the elderly 05/13/2014  . Abdominal mass of other site 05/13/2014  . Diastolic CHF 11/91/4782  . Gout 08/16/2013  . Hyponatremia 08/10/2013  . Left atrial enlargement 01/10/2013  . A-fib 12/08/2012  . Rosacea 05/15/2012  . Breast cancer screening 03/14/2011  . Anemia due to chronic blood loss 08/28/2010  . Grief 04/29/2010  . DEGENERATIVE DISC DISEASE, LUMBAR SPINE 07/14/2009  . DVT 07/11/2009  . GERD 07/11/2009  . PERSONAL HX COLONIC POLYPS 07/11/2009  . HYPERCHOLESTEROLEMIA 04/10/2006  . HYPERTENSION, BENIGN SYSTEMIC 04/10/2006  . Pulmonary embolism and infarction 04/10/2006  . DIVERTICULOSIS OF COLON 04/10/2006  . ACTINIC KERATOSIS 04/10/2006  . GEN OSTEOARTHROSIS INVOLVING MULTIPLE SITES 04/10/2006    Past Medical History:  Past Medical History  Diagnosis Date  . S/P dilatation of esophageal stricture 2002  . Hypertension   . Hyperlipidemia   . DVT (deep venous thrombosis) 2009  . PE (pulmonary embolism) 2009  . GERD (gastroesophageal reflux disease)   . Diverticulosis   . Iron deficiency anemia   .  Hyperplastic colon polyp   . Positive H. pylori test 01/20/96  . Degenerative joint disease   . Skin cancer     h/o basal and squamous skin cancer--> removed  . Persistent atrial fibrillation   . Shortness of breath dyspnea   . AKI (acute kidney injury) 09/15/2014  . Colitis, ulcerative    Past Surgical History:  Past Surgical History  Procedure Laterality Date  . Dilation and curettage of uterus      for endometrial polyps  . Replacement total knee      left  . Tonsillectomy    . Appendectomy    . Breast lumpectomy      benign  . Joint replacement      Assessment & Plan Clinical Impression: Patient is a 81 y.o. year old female with history of hypertension, atrial fibrillation, pulmonary emboli/DVT maintained on Eliquis, systolic congestive heart failure. Independent with a walker prior to admission living alone. Patient had just signed contracts to enter [facility] independent living facility. Presented 09/15/2014 with increasing shortness of breath and dizziness on exertion. Denied chest pain or fever. Chest x-ray noted cardiomegaly without effusion or pulmonary edema. Creatinine elevated 2.25 as well as hyponatremia 128. White blood cell count 12,100. Troponin mildly elevated 0.28. Urinalysis negative. Cardiology service was consulted for elevated troponins with echocardiogram showing ejection fraction of 50% as well as septal hypokinesis. Serial follow-up troponins placed on amiodarone for cardiac rate control. Patient spiked a fever 104.7 rectally and increasing respiratory distress 09/16/2014 with  rapid response contacted and placed on BiPAP.. Maintained on broad-spectrum anti-biotics. Nephrology services consulted in light of elevated creatinine as he continued to elevate with latest creatinine 4.31 suspect acute renal failure secondary to ischemic ATN from bouts of hypotension as well as ACE inhibitor that was discontinued. CT abdomen and pelvis showed questionable liver abscess  versus mass. MRI of the liver shows a 2.5 cm well circumscribed hyperintense mass located between the intrahepatic IVC and right adrenal gland. Primary differential considerations included pheochromocytoma, sarcoma or possibly metastasis with workup ongoing. Infectious disease consulted suspect sepsis versus transient bacteremia and currently maintained on intravenous Merrem and later discontinued all anti-biotics with close observation. Physical therapy evaluation completed 09/21/2014 with noted deconditioning and recommendations of physical medicine rehabilitation consult. Palliative care was consulted 09/22/2014 to lessen burden of care.  Patient transferred to CIR on 09/27/2014 .   Patient currently requires min with mobility secondary to muscle weakness and decreased cardiorespiratoy endurance.  Prior to hospitalization, patient was independent  with mobility and lived with Son (if needs to, otherwise will go to [facility]) in a House home.  Home access is 4Stairs to enter.  Patient will benefit from skilled PT intervention to maximize safe functional mobility, minimize fall risk and decrease caregiver burden for planned discharge home with intermittent assist.  Anticipate patient will benefit from follow up OP at discharge.  PT - End of Session Activity Tolerance: Tolerates 30+ min activity with multiple rests Endurance Deficit: Yes Endurance Deficit Description: Pt needing increased rest breaks between tasks PT Assessment Rehab Potential (ACUTE/IP ONLY): Excellent PT Patient demonstrates impairments in the following area(s): Balance;Endurance;Motor;Safety PT Transfers Functional Problem(s): Bed Mobility;Bed to Chair;Furniture;Car PT Locomotion Functional Problem(s): Ambulation;Stairs PT Plan PT Intensity: Minimum of 1-2 x/day ,45 to 90 minutes PT Frequency: 5 out of 7 days PT Duration Estimated Length of Stay: 7-10 days PT Transfers Anticipated Outcome(s): mod I  PT Locomotion  Anticipated Outcome(s): Mod I from ambulatory level household (may need w/c for community)  PT Recommendation Follow Up Recommendations: Home health PT;Outpatient PT (depending if she can get to OP) Patient destination: Assisted Living Equipment Recommended: To be determined  Skilled Therapeutic Intervention PT assessment and evaluation completed, see full details below.  Initiated education and demonstration in performance of bed mobility, gait with RW, car transfers and stairs during session, see below.  Note pt very reliant of UEs due to generalized weakness in BLEs, therefore provided education that part of therapy would be strengthening to increased functional independence.  Also discussed possible outing with RT as pt would frequently go out to eat.  Pt verbalized understanding and agreement.  Discussed ELOS, expected outcomes and the goal that she would D/C to [facility].    PT Evaluation Precautions/Restrictions Precautions Precautions: Fall Precaution Comments: decreased endurance Restrictions Weight Bearing Restrictions: No General Chart Reviewed: Yes Family/Caregiver Present: No Vital Signs Pain Pain Assessment Pain Assessment: No/denies pain Pain Score: 0-No pain Home Living/Prior Functioning Home Living Available Help at Discharge: Family;Available 24 hours/day Type of Home: House Home Access: Stairs to enter [employer] of Steps: 4 Entrance Stairs-Rails: Right;Left Home Layout: Multi-level;1/2 bath on main level;Bed/bath upstairs Alternate Level Stairs-Number of Steps: 5 Alternate Level Stairs-Rails: Right Bathroom Shower/Tub: Walk-in shower;Door [employer] Toilet: Standard Bathroom Accessibility: Yes Additional Comments: all info is for sons house  Lives With: Son (if needs to, otherwise will go to [facility]) Prior Function Level of Independence: Independent with basic ADLs;Independent with homemaking with ambulation;Independent with  gait;Independent with transfers  Able to Take Stairs?: Yes Driving: Yes Vocation: Retired Leisure: Hobbies-yes (Comment) (likes to go out to eat)    Cognition Overall Cognitive Status: Within Functional Limits for tasks assessed Orientation Level: Oriented X4 Attention: Alternating Alternating Attention: Appears intact Memory: Appears intact Awareness: Appears intact Problem Solving: Appears intact Safety/Judgment: Appears intact Sensation Sensation Light Touch: Appears Intact Stereognosis: Not tested Hot/Cold: Not tested Proprioception: Appears Intact Coordination Gross Motor Movements are Fluid and Coordinated: Yes Fine Motor Movements are Fluid and Coordinated: Yes Motor  Motor Motor: Other (comment) Motor - Skilled Clinical Observations: Decreased balance and endurance  Mobility Bed Mobility Bed Mobility: Supine to Sit;Sit to Supine;Rolling Left Rolling Left: 4: Min guard Supine to Sit: 4: Min assist Supine to Sit Details: Verbal cues for sequencing;Verbal cues for technique;Verbal cues for precautions/safety;Manual facilitation for weight shifting Sit to Supine: 4: Min assist Sit to Supine - Details: Verbal cues for sequencing;Verbal cues for technique;Verbal cues for precautions/safety;Manual facilitation for weight shifting Transfers Transfers: Yes Sit to Stand: 4: Min assist Sit to Stand Details: Verbal cues for sequencing;Verbal cues for precautions/safety;Manual facilitation for weight shifting;Verbal cues for technique Stand to Sit: 4: Min assist Stand to Sit Details (indicate cue type and reason): Verbal cues for sequencing;Verbal cues for technique;Verbal cues for precautions/safety;Manual facilitation for weight shifting Stand Pivot Transfers: 4: Min assist Stand Pivot Transfer Details: Verbal cues for sequencing;Verbal cues for technique;Verbal cues for precautions/safety;Manual facilitation for weight shifting Locomotion  Ambulation Ambulation:  Yes Ambulation/Gait Assistance: 4: Min assist Ambulation Distance (Feet): 65 Feet (and another 12', 50') Assistive device: Rolling walker Ambulation/Gait Assistance Details: Verbal cues for sequencing;Verbal cues for technique;Verbal cues for precautions/safety;Manual facilitation for weight shifting;Verbal cues for safe use of DME/AE;Verbal cues for gait pattern Ambulation/Gait Assistance Details: Pt premorbidly ambulates with LLE in external rotation Gait Gait: Yes Gait Pattern: Impaired Gait Pattern: Step-through pattern;Decreased stride length;Trunk flexed;Shuffle Stairs / Additional Locomotion Stairs: Yes Stairs Assistance: 4: Min assist Stairs Assistance Details: Verbal cues for sequencing;Verbal cues for technique;Verbal cues for precautions/safety;Manual facilitation for weight shifting;Verbal cues for gait pattern;Verbal cues for safe use of DME/AE Stair Management Technique: One rail Right;Step to pattern;Sideways Number of Stairs: 3 Height of Stairs: 6 Wheelchair Mobility Wheelchair Mobility: Yes Wheelchair Assistance: 4: Advertising account executive Details: Tactile cues for initiation;Tactile cues for sequencing;Verbal cues for sequencing;Verbal cues for technique;Verbal cues for precautions/safety Wheelchair Propulsion: Both upper extremities Wheelchair Parts Management: Needs assistance Distance: 100  Trunk/Postural Assessment  Cervical Assessment Cervical Assessment: Exceptions to [facility] Cervical Strength Overall Cervical Strength Comments: increased cervical flexion Thoracic Assessment Thoracic Assessment: Exceptions to [facility] Thoracic Strength Overall Thoracic Strength Comments: forward rounded shoulders Lumbar Assessment Lumbar Assessment: Exceptions to [facility] (increased posterior pelvic tilt in sitting) Postural Control Postural Control: Deficits on evaluation Postural Limitations: Pt with increased reliance on UEs for balance during all mobility.    Balance Balance Balance Assessed: Yes Static Sitting Balance Static Sitting - Level of Assistance: 6: Modified independent (Device/Increase time) Static Standing Balance Static Standing - Balance Support: During functional activity;Bilateral upper extremity supported Static Standing - Level of Assistance: 5: Stand by assistance Dynamic Standing Balance Dynamic Standing - Balance Support: During functional activity;Left upper extremity supported Dynamic Standing - Level of Assistance: 4: Min assist Extremity Assessment      RLE Assessment RLE Assessment: Within Functional Limits (grossly WFL, hip flex slightly weaker) LLE Assessment LLE Assessment: Within Functional Limits (grossly WFL, hip flex weaker )  Function: Toileting Toileting       Bed Mobility  Roll left and right activity   Assist level: Touching or steadying assistance (Pt > 75%, lift 1 leg)  Sit to lying activity   Assist level: Touching or steadying assistance (Pt > 75%, lift 1 leg)  Lying to sitting activity   Assist level: Touching or steadying assistance (Pt > 75%, lift 1 leg)  Mobility details Bed mobility details: Verbal cues for techniques;Verbal cues for safe use of DME/AE;Verbal cues for sequencing;Verbal cues for precautions/safety   Transfers Sit to stand transfer   Sit to stand assist level: Touching or steadying assistance (Pt > 75%/lift 1 leg) Sit to stand assistive device: Walker  Chair/bed transfer   Chair/bed transfer method: Ambulatory Chair/bed transfer assist level: Touching or steadying assistance (Pt > 75%) Chair/bed transfer assistive device: Walker   Chair/bed transfer details: Visual cues/gestures for sequencing;Verbal cues for sequencing;Verbal cues for technique;Verbal cues for precautions/safety;Manual facilitation for weight shifting   Physiological scientist transfer assistive device: Publishing rights manager transfer assist level: Touching or steadying assistance (Pt >  75%, lift 1 leg)    Locomotion Ambulation   Assistive device: Walker-rolling   Assist level: Touching or steadying assistance (Pt > 75%)  Walk 10 feet activity   Assist level: Touching or steadying assistance (Pt > 75%)  Walk 50 feet with 2 turns activity   Assist level: Touching or steadying assistance (Pt > 75%)  Walk 150 feet activity Walk 150 feet activity did not occur: Safety/medical concerns (endurance limitations)    Walk 10 feet on uneven surfaces activity      Stairs   Stairs assistive device: 1 hand rail   Stairs assist level: Moderate assist (Pt 50 - 74%)  Walk up/down 1 step activity     Walk up/down 1 step (curb) assist level: Moderate assist (Pt 50 - 74%)  Walk up/down 4 steps activity Walk up/down 4 steps activity did not occur: Safety/medical concerns    Walk up/down 12 steps activity Walk up/down 12 steps activity did not occur: Safety/medical concerns    Pick up small objects from floor   Assist level: Touching or steadying assistance (Pt > 75%) (with use of RW)  Wheelchair   Type: Manual Max wheelchair distance: 100 Assist Level: Touching or steadying assistance (Pt > 75%)  Wheel 50 feet with 2 turns activity   Assist Level: Touching or steadying assistance (Pt > 75%)  Wheel 150 feet activity Wheel 150 feet activity did not occur: Safety/medical concerns (endurance limitations)     Cognition Comprehension Comprehension assist level: Follows complex conversation/direction with no assist  Expression Expression assist level: Expresses complex ideas: With no assist  Social Interaction Social Interaction assist level: Interacts appropriately with others - No medications needed.  Problem Solving Problem solving assist level: Solves complex problems: Recognizes & self-corrects  Memory Memory assist level: Complete Independence: No helper    Refer to Care Plan for Long Term Goals  Recommendations for other services: None  Discharge Criteria: Patient will  be discharged from PT if patient refuses treatment 3 consecutive times without medical reason, if treatment goals not met, if there is a change in medical status, if patient makes no progress towards goals or if patient is discharged from hospital.  The above assessment, treatment plan, treatment alternatives and goals were discussed and mutually agreed upon: by patient  Denice Bors 09/28/2014, 10:44 AM

## 2014-09-28 NOTE — Progress Notes (Signed)
Patient information reviewed and entered into eRehab system by Paquita Printy, RN, CRRN, PPS Coordinator.  Information including medical coding and functional independence measure will be reviewed and updated through discharge.     Per nursing patient was given "Data Collection Information Summary for Patients in Inpatient Rehabilitation Facilities with attached "Privacy Act Statement-Health Care Records" upon admission.  

## 2014-09-28 NOTE — Evaluation (Signed)
Occupational Therapy Assessment and Plan  Patient Details  Name: Sara Russell MRN: [id number] Date of Birth: Jul 06, 1930  OT Diagnosis: abnormal posture and muscle weakness (generalized) Rehab Potential: Rehab Potential (ACUTE ONLY): Excellent ELOS: 7-10 days   Today's Date: 09/28/2014 OT Individual Time: 1030-1130 OT Individual Time Calculation (min): 60 min     Problem List:  Patient Active Problem List   Diagnosis Date Noted  . Physical debility 09/27/2014  . Respiratory distress   . Encounter for palliative care   . ARF (acute renal failure)   . Sepsis   . FUO (fever of unknown origin)   . Liver mass, left lobe   . Septic shock   . AKI (acute kidney injury) 09/15/2014  . SOB (shortness of breath) 09/15/2014  . Elevated troponin   . Chronic diastolic congestive heart failure   . Elevated WBC count   . Urinary tract infectious disease   . Blood poisoning   . Osteopenia of the elderly 05/13/2014  . Abdominal mass of other site 05/13/2014  . Diastolic CHF 99/24/2683  . Gout 08/16/2013  . Hyponatremia 08/10/2013  . Left atrial enlargement 01/10/2013  . A-fib 12/08/2012  . Rosacea 05/15/2012  . Breast cancer screening 03/14/2011  . Anemia due to chronic blood loss 08/28/2010  . Grief 04/29/2010  . DEGENERATIVE DISC DISEASE, LUMBAR SPINE 07/14/2009  . DVT 07/11/2009  . GERD 07/11/2009  . PERSONAL HX COLONIC POLYPS 07/11/2009  . HYPERCHOLESTEROLEMIA 04/10/2006  . HYPERTENSION, BENIGN SYSTEMIC 04/10/2006  . Pulmonary embolism and infarction 04/10/2006  . DIVERTICULOSIS OF COLON 04/10/2006  . ACTINIC KERATOSIS 04/10/2006  . GEN OSTEOARTHROSIS INVOLVING MULTIPLE SITES 04/10/2006    Past Medical History:  Past Medical History  Diagnosis Date  . S/P dilatation of esophageal stricture 2002  . Hypertension   . Hyperlipidemia   . DVT (deep venous thrombosis) 2009  . PE (pulmonary embolism) 2009  . GERD (gastroesophageal reflux disease)   . Diverticulosis   . Iron  deficiency anemia   . Hyperplastic colon polyp   . Positive H. pylori test 01/20/96  . Degenerative joint disease   . Skin cancer     h/o basal and squamous skin cancer--> removed  . Persistent atrial fibrillation   . Shortness of breath dyspnea   . AKI (acute kidney injury) 09/15/2014  . Colitis, ulcerative    Past Surgical History:  Past Surgical History  Procedure Laterality Date  . Dilation and curettage of uterus      for endometrial polyps  . Replacement total knee      left  . Tonsillectomy    . Appendectomy    . Breast lumpectomy      benign  . Joint replacement      Assessment & Plan Clinical Impression: [name] is a 81 y.o. right handed female with history of hypertension, atrial fibrillation, pulmonary emboli/DVT maintained on Eliquis, systolic congestive heart failure. Independent with a walker prior to admission living alone. Patient had just signed contracts to enter [place] independent living facility. Presented 09/15/2014 with increasing shortness of breath and dizziness on exertion. Denied chest pain or fever. Chest x-ray noted cardiomegaly without effusion or pulmonary edema. Creatinine elevated 2.25 as well as hyponatremia 128. White blood cell count 12,100. Troponin mildly elevated 0.28. Urinalysis negative. Cardiology service was consulted for elevated troponins with echocardiogram showing ejection fraction of 50% as well as septal hypokinesis. Serial follow-up troponins placed on amiodarone for cardiac rate control. Patient spiked a fever 104.7 rectally and  increasing respiratory distress 09/16/2014 with rapid response contacted and placed on BiPAP.. Maintained on broad-spectrum anti-biotics. Nephrology services consulted in light of elevated creatinine as he continued to elevate with latest creatinine 4.31 suspect acute renal failure secondary to ischemic ATN from bouts of hypotension as well as ACE inhibitor that was discontinued. CT abdomen and pelvis  showed questionable liver abscess versus mass. MRI of the liver shows a 2.5 cm well circumscribed hyperintense mass located between the intrahepatic IVC and right adrenal gland. Primary differential considerations included pheochromocytoma, sarcoma or possibly metastasis with workup ongoing. Infectious disease consulted suspect sepsis versus transient bacteremia and currently maintained on intravenous Merrem and later discontinued all anti-biotics with close observation. Physical therapy evaluation completed 09/21/2014 with noted deconditioning and recommendations of physical medicine rehabilitation consult. Palliative care was consulted 09/22/2014 to lessen burden of care. Patient was admitted for comprehensive rehabilitation program    Patient currently requires min with basic self-care skills secondary to muscle weakness and decreased cardiorespiratoy endurance.  Prior to hospitalization, patient could complete ADLs/IADLs with independent .  Patient will benefit from skilled intervention to increase independence with basic self-care skills and increase level of independence with iADL . Pt plans to d/c to assisted living facility.  Anticipate patient will require intermittent supervision and no further OT follow recommended.  OT - End of Session Activity Tolerance: Tolerates 30+ min activity with multiple rests Endurance Deficit: Yes Endurance Deficit Description: Pt needing increased rest breaks between tasks OT Assessment Rehab Potential (ACUTE ONLY): Excellent OT Patient demonstrates impairments in the following area(s): Balance;Endurance;Motor;Safety OT Basic ADL's Functional Problem(s): Dressing;Toileting;Bathing OT Advanced ADL's Functional Problem(s): Simple Meal Preparation;Laundry OT Transfers Functional Problem(s): Toilet;Tub/Shower OT Plan OT Intensity: Minimum of 1-2 x/day, 45 to 90 minutes OT Frequency: 5 out of 7 days OT Duration/Estimated Length of Stay: 7-10 days OT  Treatment/Interventions: Balance/vestibular training;Cognitive remediation/compensation;Community reintegration;Discharge planning;DME/adaptive equipment instruction;Patient/family education;Self Care/advanced ADL retraining;Therapeutic Activities;Therapeutic Exercise;UE/LE Strength taining/ROM OT Self Feeding Anticipated Outcome(s): Independent OT Basic Self-Care Anticipated Outcome(s): Mod I OT Toileting Anticipated Outcome(s): Mod I OT Bathroom Transfers Anticipated Outcome(s): Mod I OT Recommendation Patient destination: Assisted Living Follow Up Recommendations: None Equipment Recommended: None recommended by OT   Skilled Therapeutic Intervention Pt seen for OT eval and ADL bathing and dressing session. Pt sitting up in w/c upon arrival, agreeable to tx session. See below for required level of assist with functional tasks and transfers. Education provided throughout session regarding role of OT, POC, OT goals, energy conservation, safety awareness, and d/c planning. She required rest breaks throughout session due to decreased functional activity tolerance. She required increased assist to stand from low surface toilet due to decreased LE strength. Pt returned to supine at end of session, left with all needs in reach.   OT Evaluation Precautions/Restrictions  Precautions Precautions: Fall Precaution Comments: decreased endurance Restrictions Weight Bearing Restrictions: No General Chart Reviewed: Yes Pain Pain Assessment Pain Assessment: No/denies pain Pain Score: 0-No pain Home Living/Prior Functioning Home Living Family/patient expects to be discharged to:: Private residence Living Arrangements: Alone Available Help at Discharge: Family, Available 24 hours/day Type of Home: House Home Access: Stairs to enter [employer] of Steps: 4 Entrance Stairs-Rails: Right, Left Home Layout: Multi-level, 1/2 bath on main level, Bed/bath upstairs Alternate Level Stairs-Number  of Steps: 5 Alternate Level Stairs-Rails: Right Bathroom Shower/Tub: Gaffer, Door [employer] Toilet: Standard Bathroom Accessibility: Yes Additional Comments: all info is for sons house  Lives With: Son (if needs to, otherwise will go to [facility]) Prior  Function Level of Independence: Independent with basic ADLs, Independent with homemaking with ambulation, Independent with gait, Independent with transfers  Able to Take Stairs?: Yes Driving: Yes Vocation: Retired Leisure: Hobbies-yes (Comment) (likes to go out to eat) Vision/Perception  Vision- History Baseline Vision/History: Wears glasses Wears Glasses: Reading only Patient Visual Report: No change from baseline  Cognition Overall Cognitive Status: Within Functional Limits for tasks assessed Arousal/Alertness: Awake/alert Orientation Level: Person;Place;Situation Person: Oriented Place: Oriented Situation: Oriented Year: 2016 Month: August Day of Week: Correct Memory: Appears intact Immediate Memory Recall: Sock;Blue;Bed Memory Recall: Sock;Blue;Bed Memory Recall Sock: Without Cue Memory Recall Blue: With Cue Memory Recall Bed: With Cue Attention: Alternating Awareness: Appears intact Problem Solving: Appears intact Safety/Judgment: Appears intact Sensation Sensation Light Touch: Appears Intact Stereognosis: Not tested Hot/Cold: Not tested Proprioception: Appears Intact Coordination Gross Motor Movements are Fluid and Coordinated: Yes Fine Motor Movements are Fluid and Coordinated: Yes Motor  Motor Motor: Other (comment) Motor - Skilled Clinical Observations: Decreased balance and endurance Mobility  Bed Mobility Bed Mobility: Supine to Sit;Sit to Supine;Rolling Left Rolling Left: 4: Min guard Supine to Sit: 4: Min assist Supine to Sit Details: Verbal cues for sequencing;Verbal cues for technique;Verbal cues for precautions/safety;Manual facilitation for weight shifting Sit to Supine: 4: Min  assist Sit to Supine - Details: Verbal cues for sequencing;Verbal cues for technique;Verbal cues for precautions/safety;Manual facilitation for weight shifting Transfers Sit to Stand: 4: Min assist Sit to Stand Details: Verbal cues for sequencing;Verbal cues for precautions/safety;Manual facilitation for weight shifting;Verbal cues for technique Stand to Sit: 4: Min assist Stand to Sit Details (indicate cue type and reason): Verbal cues for sequencing;Verbal cues for technique;Verbal cues for precautions/safety;Manual facilitation for weight shifting  Trunk/Postural Assessment  Cervical Assessment Cervical Assessment: Exceptions to [facility] Cervical Strength Overall Cervical Strength Comments: increased cervical flexion Thoracic Assessment Thoracic Assessment: Exceptions to [facility] Thoracic Strength Overall Thoracic Strength Comments: forward rounded shoulders Lumbar Assessment Lumbar Assessment: Exceptions to [facility] (increased posterior pelvic tilt in sitting) Postural Control Postural Control: Deficits on evaluation Postural Limitations: Pt with increased reliance on UEs for balance during all mobility.   Balance Balance Balance Assessed: Yes Static Sitting Balance Static Sitting - Level of Assistance: 6: Modified independent (Device/Increase time) Static Standing Balance Static Standing - Balance Support: During functional activity;Bilateral upper extremity supported Static Standing - Level of Assistance: 5: Stand by assistance Dynamic Standing Balance Dynamic Standing - Balance Support: During functional activity;Left upper extremity supported Dynamic Standing - Level of Assistance: 4: Min assist Extremity/Trunk Assessment RUE Assessment RUE Assessment: Exceptions to [facility] (3+/5 overall) LUE Assessment LUE Assessment: Exceptions to WFL (4/5 overall)  Function:   Grooming Oral Care,Brush Teeth, Clean Dentures Activity:             Wash, Rinse, Dry Face Activity   Assist Level: Set  up   Set up : To obtain items  Wash, Rinse, Dry Hands Activity          Brush, Comb Hair Activity   Assist Level: Set up to obtain items    Shave Activity          Apply Makeup Activity                                                             Bathing Bathing position  Position: Shower  Bathing parts Body parts bathed by patient: Right arm;Right upper leg;Left arm;Left upper leg;Abdomen;Chest;Right lower leg;Front perineal area;Left lower leg;Buttocks;Back    Bathing assist Assist Level: Touching or steadying assistance(Pt > 75%)       Upper Body Dressing/Undressing Upper body dressing   What is the patient wearing?: Hospital gown (No personal clothing available on eval)                Upper body assist  Set-Up       Lower Body Dressing/Undressing Lower body dressing   What is the patient wearing?: Non-skid slipper socks (No personal clothing available on eval)         Non-skid slipper socks- Performed by patient: Don/doff right sock;Don/doff left sock                    Lower body assist Assist Level: Set up   Set up : To obtain clothing/put away   Toileting Toileting   Toileting steps completed by patient: Adjust clothing prior to toileting;Performs perineal hygiene;Adjust clothing after toileting   Toileting Assistive Devices: Grab bar or rail  Toileting assist Assist level: Touching or steadying assistance (Pt.75%)   Bed Mobility Roll left and right activity   Assist level: Touching or steadying assistance (Pt > 75%, lift 1 leg)  Sit to lying activity   Assist level: Supervision or verbal cues  Lying to sitting activity   Assist level: Touching or steadying assistance (Pt > 75%, lift 1 leg)  Mobility details Bed mobility details: Verbal cues for techniques;Verbal cues for safe use of DME/AE;Verbal cues for sequencing;Verbal cues for precautions/safety   Transfers Sit to stand transfer   Sit to stand assist level: Touching or  steadying assistance (Pt > 75%/lift 1 leg) Sit to stand assistive device: Walker;Armrests  Chair/bed transfer   Chair/bed transfer method: Ambulatory Chair/bed transfer assist level: Touching or steadying assistance (Pt > 75%) Chair/bed transfer assistive device: Walker   Chair/bed transfer details: Visual cues/gestures for sequencing;Verbal cues for sequencing;Verbal cues for technique;Verbal cues for precautions/safety;Manual facilitation for weight shifting  Toilet transfer  Steadying assist ambulating with RW.              Tub/shower transfer   Tub/shower assistive device: Teaching laboratory technician level into tub: Touching or steadying assistance (Pt > 75%/lift 1 leg) Assist level out of tub: Touching or steadying assistance (Pt > 75%)   Cognition Comprehension Comprehension assist level: Follows complex conversation/direction with no assist  Expression Expression assist level: Expresses complex ideas: With no assist  Social Interaction Social Interaction assist level: Interacts appropriately with others - No medications needed.  Problem Solving Problem solving assist level: Solves complex problems: Recognizes & self-corrects  Memory Memory assist level: Complete Independence: No helper    Refer to Care Plan for Long Term Goals  Recommendations for other services: None  Discharge Criteria: Patient will be discharged from OT if patient refuses treatment 3 consecutive times without medical reason, if treatment goals not met, if there is a change in medical status, if patient makes no progress towards goals or if patient is discharged from hospital.  The above assessment, treatment plan, treatment alternatives and goals were discussed and mutually agreed upon: by patient  Ernestina Patches 09/28/2014, 12:07 PM

## 2014-09-28 NOTE — Progress Notes (Signed)
[facility]ILITATION     PROGRESS NOTE    Subjective/Complaints: Restless last night. Still has some "breakdown" on right buttock. Denies pain. Anxious about therapies  ROS: Pt denies fever, rash/itching, headache, blurred or double vision, nausea, vomiting, abdominal pain, diarrhea, chest pain, shortness of breath, palpitations, dysuria, dizziness, neck or back pain, bleeding, or depression   Objective: Vital Signs: Blood pressure 112/63, pulse 104, temperature 98.2 F (36.8 C), temperature source Oral, resp. rate 18, height 5' 1"  (1.549 m), weight 73.4 kg (161 lb 13.1 oz), SpO2 93 %. No results found.  Recent Labs  09/26/14 0323 09/27/14 0341  WBC 10.7* 6.5  HGB 11.1* 10.4*  HCT 32.3* 30.8*  PLT 123* 134*    Recent Labs  09/27/14 0341 09/28/14 0643  NA 133* 132*  K 4.1 3.7  CL 95* 93*  GLUCOSE 94 100*  BUN 61* 62*  CREATININE 4.45* 4.33*  CALCIUM 7.8* 7.9*   CBG (last 3)  No results for input(s): GLUCAP in the last 72 hours.  Wt Readings from Last 3 Encounters:  09/28/14 73.4 kg (161 lb 13.1 oz)  09/27/14 71.804 kg (158 lb 4.8 oz)  05/16/14 70.217 kg (154 lb 12.8 oz)    Physical Exam:  Constitutional: She is oriented to person, place, and time.  HENT: oral mucosa pink and moist Head: Normocephalic.  Eyes: EOM are normal.  Neck: Normal range of motion. Neck supple. No thyromegaly present.  Cardiovascular:  Cardiac rate controlled, irregularly irregular  Respiratory: Effort normal and breath sounds normal. No respiratory distress.  GI: Soft. Bowel sounds are normal. She exhibits no distension. There is no tenderness.  Neurological: She is alert and oriented to person, place, and time.  Motor strength is 4/5 bilateral deltoids, biceps, triceps, grip, hip flexor, knee extensor, ankle dorsiflexor and plantar flexor, DTR's 2+ Sensation is intact to light touch in bilateral upper and lower limbs Mood and affect are  appropriate There is no evidence of dysarthria, good phonation. Coordination appears normal  Good insight and awareness.  Skin: intact, warm---mild redness/excoriation around right buttock and gluteal folds---minimal breakdown Psych: pleasant and appropriate  Assessment/Plan: 1. Functional deficits secondary to debility after multiple medical which require 3+ hours per day of interdisciplinary therapy in a comprehensive inpatient rehab setting. Physiatrist is providing close team supervision and 24 hour management of active medical problems listed below. Physiatrist and rehab team continue to assess barriers to discharge/monitor patient progress toward functional and medical goals.  Function:  Bathing Bathing position      Bathing parts      Bathing assist        Upper Body Dressing/Undressing Upper body dressing                    Upper body assist        Lower Body Dressing/Undressing Lower body dressing                                  Lower body assist        Toileting Toileting          Toileting assist     Transfers Chair/bed transfer             Locomotion Ambulation           Wheelchair          Cognition Comprehension Comprehension assist level: Follows complex conversation/direction with  no assist  Expression Expression assist level: Expresses complex ideas: With no assist  Social Interaction Social Interaction assist level: Interacts appropriately with others - No medications needed.  Problem Solving Problem solving assist level: Solves complex problems: Recognizes & self-corrects  Memory Memory assist level: Complete Independence: No helper   Medical Problem List and Plan: 1. Functional deficits secondary to debilitation related to sepsis with acute renal failure 2. DVT Prophylaxis/Anticoagulation: SCDs. oob 3. Pain Management: Tylenol as needed 4. Acute renal failure secondary to ischemic ATN. Follow-up renal  services  -labs stable. I personally reviewed the patient's labs today.  5. Neuropsych: This patient is capable of making decisions on her own behalf. 6. Skin/Wound Care: Routine skin checks. Fungal powder, oob, pressure relief to buttocks 7. Fluids/Electrolytes/Nutrition: Routine I&O with follow-up chemistries 8. ID/sepsis/liver mass. All antibiotics currently discontinued  9. Hypertension/atrial fibrillation. Continue amiodarone 200 mg daily, Coreg 3.125 mg twice a day. Question plan to resume Eliquis. Cardiac rate controlled 10. Systolic congestive heart failure. Monitor for any signs of fluid overload, daily weights 11. Hyperlipidemia. Lipitor  .   LOS (Days) 1 A FACE TO FACE EVALUATION WAS PERFORMED  Sara Russell T 09/28/2014 7:43 AM

## 2014-09-28 NOTE — Progress Notes (Signed)
Assessment/Plan: #1 Nonoliguric acute kidney injury: Likely hemodynamic ACE inhibitor use. Good UOP  Plan Cont supportive care and diuresis   Subjective: Interval History: Encouraged on rehab  Objective: Vital signs in last 24 hours: Temp:  [98.1 F (36.7 C)-98.2 F (36.8 C)] 98.1 F (36.7 C) (08/17 1443) Pulse Rate:  [95-104] 95 (08/17 1443) Resp:  [18] 18 (08/17 1443) BP: (110-112)/(63-67) 110/67 mmHg (08/17 1443) SpO2:  [93 %-95 %] 95 % (08/17 1443) Weight:  [73.4 kg (161 lb 13.1 oz)] 73.4 kg (161 lb 13.1 oz) (08/17 0644) Weight change:   Intake/Output from previous day: 08/16 0701 - 08/17 0700 In: 120 [P.O.:120] Out: 450 [Urine:450] Intake/Output this shift: Total I/O In: 240 [P.O.:240] Out: -   General appearance: alert and cooperative Resp: clear to auscultation bilaterally Cardio: irregularly irregular rhythm Extremities: edema 1+  Lab Results:  Recent Labs  09/26/14 0323 09/27/14 0341  WBC 10.7* 6.5  HGB 11.1* 10.4*  HCT 32.3* 30.8*  PLT 123* 134*   BMET:  Recent Labs  09/27/14 0341 09/28/14 0643  NA 133* 132*  K 4.1 3.7  CL 95* 93*  CO2 27 28  GLUCOSE 94 100*  BUN 61* 62*  CREATININE 4.45* 4.33*  CALCIUM 7.8* 7.9*   No results for input(s): PTH in the last 72 hours. Iron Studies: No results for input(s): IRON, TIBC, TRANSFERRIN, FERRITIN in the last 72 hours. Studies/Results: No results found.  Scheduled: . amiodarone  200 mg Oral Daily  . apixaban  2.5 mg Oral BID  . atorvastatin  10 mg Oral q1800  . carvedilol  6.25 mg Oral BID WC  . feeding supplement (NEPRO CARB STEADY)  237 mL Oral TID WC  . furosemide  80 mg Oral BID  . iron polysaccharides  150 mg Oral Q1200  . nystatin   Topical BID  . pantoprazole  40 mg Oral Daily  . potassium chloride  10 mEq Oral BID  . triamcinolone cream   Topical BID    LOS: 1 day   Cleatus Goodin C 09/28/2014,3:15 PM

## 2014-09-28 NOTE — Discharge Instructions (Addendum)
Inpatient Rehab Discharge Instructions  Sara Russell Discharge date and time: 10/07/14   Activities/Precautions/ Functional Status: Activity: activity as tolerated Diet: renal diet 2000 fluid/day Wound Care: none needed Functional status:  ___ No restrictions     ___ Walk up steps independently ___ 24/7 supervision/assistance   ___ Walk up steps with assistance _X__ Intermittent supervision/assistance  ___ Bathe/dress independently ___ Walk with walker     _X__ Bathe/dress with assistance ___ Walk Independently    ___ Shower independently ___ Walk with assistance    ___ Shower with assistance _X__ No alcohol     ___ Return to work/school ________  Special Instructions:   SEEK MEDICAL CARE IF:   Your weight increases by 03 lb/1.4 kg in 1 day or 05 lb/2.3 kg in a week.  You have increasing shortness of breath that is unusual for you.  You are unable to participate in your usual physical activities.  You tire easily.  You cough more than normal, especially with physical activity.  You have any or more swelling in areas such as your hands, feet, ankles, or abdomen.  You are unable to sleep because it is hard to breathe.  You feel like your heart is beating fast (palpitations).  You become dizzy or light-headed upon standing up. SEEK IMMEDIATE MEDICAL CARE IF:   You have difficulty breathing.  There is a change in mental status such as decreased alertness or difficulty with concentration.  You have a pain or discomfort in your chest.  You have an episode of fainting (syncope). MAKE SURE YOU:   Understand these instructions.  Will watch your condition.  Will get help right away if you are not doing well or get worse. Document Released: 01/28/2005 Document Revised: 06/14/2013 Document Reviewed: 02/28/2012 [facility] Patient Information 2015 [place], [place]. This information is not intended to replace advice given to you by your health care provider. Make sure you  discuss any questions you have with your health care provider.   My questions have been answered and I understand these instructions. I will adhere to these goals and the provided educational materials after my discharge from the hospital.  Patient/Caregiver Signature _______________________________ Date __________  Clinician Signature _______________________________________ Date __________  Please bring this form and your medication list with you to all your follow-up doctor's appointments.   Information on my medicine - ELIQUIS (apixaban)  This medication education was reviewed with me or my healthcare representative as part of my discharge preparation.    Why was Eliquis prescribed for you? Eliquis was prescribed for you to reduce the risk of a blood clot forming that can cause a stroke if you have a medical condition called atrial fibrillation (a type of irregular heartbeat).  What do You need to know about Eliquis ? Take your Eliquis 2.5 mg TWICE DAILY - one tablet in the morning and one tablet in the evening with or without food. If you have difficulty swallowing the tablet whole please discuss with your pharmacist how to take the medication safely.  Take Eliquis exactly as prescribed by your doctor and DO NOT stop taking Eliquis without talking to the doctor who prescribed the medication.  Stopping may increase your risk of developing a stroke.  Refill your prescription before you run out.  After discharge, you should have regular check-up appointments with your healthcare provider that is prescribing your Eliquis.  In the future your dose may need to be changed if your kidney function or weight changes by a significant amount  or as you get older.  What do you do if you miss a dose? If you miss a dose, take it as soon as you remember on the same day and resume taking twice daily.  Do not take more than one dose of ELIQUIS at the same time to make up a missed  dose.  Important Safety Information A possible side effect of Eliquis is bleeding. You should call your healthcare provider right away if you experience any of the following: ? Bleeding from an injury or your nose that does not stop. ? Unusual colored urine (red or dark brown) or unusual colored stools (red or black). ? Unusual bruising for unknown reasons. ? A serious fall or if you hit your head (even if there is no bleeding).  Some medicines may interact with Eliquis and might increase your risk of bleeding or clotting while on Eliquis. To help avoid this, consult your healthcare provider or pharmacist prior to using any new prescription or non-prescription medications, including herbals, vitamins, non-steroidal anti-inflammatory drugs (NSAIDs) and supplements.  This website has more information on Eliquis (apixaban): http://www.eliquis.com/eliquis/home

## 2014-09-28 NOTE — Progress Notes (Signed)
Physical Therapy Session Note  Patient Details  Name: Sara Russell MRN: [id number] Date of Birth: 03-05-1930  Today's Date: 09/28/2014 PT Individual Time: 1600-1700 PT Individual Time Calculation (min): 60 min   Short Term Goals: Week 1:  PT Short Term Goal 1 (Week 1): =LTG's due to ELOS  Skilled Therapeutic Interventions/Progress Updates:   Pt received lying in bed agreeable to therapy session.  Performed bed mobility at S level with HOB elevated and use of rails.  Pt stating needing to use restroom, therefore ambulated to/from restroom at min/guard level with mod cues for safe use of RW in restroom.  Able to perform peri care and management of clothing at steady A level.  Ambulated back to sink to wash/dry hands at close S level.  Assisted to therapy gym for time management and energy conservation.  Provided and performed OTAGO HEP and performed B LE x 10 reps, see handout.  Requires seated rest break following each exercise.  Upon standing during last exercise, pt stating bowel urgency and needing to return to room quickly.  Assisted back to room for pt to continue voiding, RN made aware.  Following toileting, assisted with donning new gown and peri care and total A level.  Provided education and demonstration as well as handout on pressure relief with forward lean over lap table.  Pt able to return demonstration and education to perform every hour for 2 mins.  RN made aware also to remind pt intermittently.  Pt left in w/c with all needs in reach.    Therapy Documentation Precautions:  Precautions Precautions: Fall Precaution Comments: decreased endurance Restrictions Weight Bearing Restrictions: No   Vital Signs: Therapy Vitals Temp: 98.1 F (36.7 C) Temp Source: Oral Pulse Rate: 95 Resp: 18 BP: 110/67 mmHg Patient Position (if appropriate): Lying Oxygen Therapy SpO2: 95 % O2 Device: Not Delivered Pain: no c/o pain during session.    Function:  Biomedical scientist Devices: Grab bar or rail   Bed Mobility Roll left and right activity      Sit to lying activity      Lying to sitting activity      Mobility details     Transfers Sit to stand transfer        Chair/bed Naval architect transfer assistive device: Mining engineer          Walk 10 feet activity      Walk 50 feet with 2 turns activity      Walk 150 feet activity      Walk 10 feet on uneven surfaces activity      Stairs          Walk up/down 1 step activity        Walk up/down 4 steps activity      Walk up/down 12 steps activity      Pick up small objects from floor      Wheelchair          Wheel 50 feet with 2 turns activity      Wheel 150 feet activity       Cognition Comprehension Comprehension assist level: Follows complex conversation/direction with no assist  Expression Expression assist level: Expresses complex ideas: With no assist  Social Interaction Social Interaction assist level: Interacts appropriately  with others - No medications needed.  Problem Solving Problem solving assist level: Solves complex problems: Recognizes & self-corrects  Memory Memory assist level: Complete Independence: No helper    Therapy/Group: Individual Therapy  Denice Bors 09/28/2014, 4:35 PM

## 2014-09-29 ENCOUNTER — Inpatient Hospital Stay (HOSPITAL_COMMUNITY): Payer: Medicare Other | Admitting: Occupational Therapy

## 2014-09-29 ENCOUNTER — Inpatient Hospital Stay (HOSPITAL_COMMUNITY): Payer: Medicare Other

## 2014-09-29 DIAGNOSIS — I482 Chronic atrial fibrillation: Secondary | ICD-10-CM

## 2014-09-29 DIAGNOSIS — I5033 Acute on chronic diastolic (congestive) heart failure: Secondary | ICD-10-CM

## 2014-09-29 LAB — RENAL FUNCTION PANEL
Albumin: 2.1 g/dL — ABNORMAL LOW (ref 3.5–5.0)
Anion gap: 10 (ref 5–15)
BUN: 61 mg/dL — ABNORMAL HIGH (ref 6–20)
CO2: 31 mmol/L (ref 22–32)
Calcium: 8.4 mg/dL — ABNORMAL LOW (ref 8.9–10.3)
Chloride: 97 mmol/L — ABNORMAL LOW (ref 101–111)
Creatinine, Ser: 4.12 mg/dL — ABNORMAL HIGH (ref 0.44–1.00)
GFR calc Af Amer: 11 mL/min — ABNORMAL LOW (ref >60)
GFR calc non Af Amer: 9 mL/min — ABNORMAL LOW (ref >60)
Glucose, Bld: 101 mg/dL — ABNORMAL HIGH (ref 65–99)
Phosphorus: 5.1 mg/dL — ABNORMAL HIGH (ref 2.5–4.6)
Potassium: 4.3 mmol/L (ref 3.5–5.1)
Sodium: 138 mmol/L (ref 135–145)

## 2014-09-29 LAB — CATECHOLAMINES, FRACTIONATED, URINE, 24 HOUR
Catecholamines T: 6 mcg/24 h — ABNORMAL LOW (ref 26–121)
Creatinine, Urine mg/day-CATEUR: 0.51 g/(24.h) — ABNORMAL LOW (ref 0.63–2.50)
Dopamine 24 Hr Urine: 26 mcg/24 h — ABNORMAL LOW (ref 52–480)
Norepinephrine 24 Hr Urine: 6 mcg/24 h — ABNORMAL LOW (ref 15–100)
Total urine volume: 2300 mL

## 2014-09-29 MED ORDER — SACCHAROMYCES BOULARDII 250 MG PO CAPS
250.0000 mg | ORAL_CAPSULE | Freq: Two times a day (BID) | ORAL | Status: DC
  Administered 2014-09-29 – 2014-10-07 (×17): 250 mg via ORAL
  Filled 2014-09-29 (×17): qty 1

## 2014-09-29 NOTE — Progress Notes (Signed)
Physical Therapy Session Note  Patient Details  Name: Sara Russell MRN: [id number] Date of Birth: [birth year]  Today's Date: 09/29/2014 PT Individual Time: 0800-0900 PT Individual Time Calculation (min): 60 min   Short Term Goals: Week 1:  PT Short Term Goal 1 (Week 1): =LTG's due to ELOS  Skilled Therapeutic Interventions/Progress Updates:    Session focused on addressing overall activity tolerance/endurance, bed mobility, transfers, dynamic standing balance during functional dressing tasks and mobility to obtain clothing (see Function tab for dressing details), gait training with RW for endurance and strengthening and functional mobility training x 50', x 85' with cues for posture and intermittent steady A/close S, and stair negotiation training for community mobility training and functional strengthening.  See below for details of transfers and mobility. Pt requires cues for hand placement and technique throughout. Continue PT POC.  Therapy Documentation Precautions:  Precautions Precautions: Fall Precaution Comments: decreased endurance Restrictions Weight Bearing Restrictions: No  Pain:  Denies pain.   Function:  Toileting Toileting       Bed Mobility Roll left and right activity   Assist level: Supervision or verbal cues  Sit to lying activity   Assist level: Supervision or verbal cues  Lying to sitting activity   Assist level: Supervision or verbal cues  Mobility details Bed mobility details: Verbal cues for techniques   Transfers Sit to stand transfer   Sit to stand assist level: Touching or steadying assistance (Pt > 75%/lift 1 leg) Sit to stand assistive device: Walker;Armrests  Chair/bed transfer   Chair/bed transfer method: Ambulatory Chair/bed transfer assist level: Touching or steadying assistance (Pt > 75%) Chair/bed transfer assistive device: Walker   Chair/bed transfer details: Verbal cues for technique;Verbal cues for safe use of DME/AE;Manual  facilitation for weight shifting   Marine scientist     Max distance: 85 Assist level: Touching or steadying assistance (Pt > 75%)  Walk 10 feet activity   Assist level: Supervision or verbal cues  Walk 50 feet with 2 turns activity   Assist level: Touching or steadying assistance (Pt > 75%)  Walk 150 feet activity Walk 150 feet activity did not occur: Safety/medical concerns (unable -endurance)    Walk 10 feet on uneven surfaces activity      Stairs   Stairs assistive device: 1 hand rail;2 hand rails Max number of stairs: 4 Stairs assist level: Touching or steadying assistance (Pt > 75%)  Walk up/down 1 step activity        Walk up/down 4 steps activity   Walk up/down 4 steps assist level: Touching or steadying assistance (Pt > 75%)  Walk up/down 12 steps activity Walk up/down 12 steps activity did not occur: Safety/medical concerns (unable due to endurance)    Pick up small objects from floor      Wheelchair       Assist Level: Dependent (Pt equals 0%) (for time management)  Wheel 50 feet with 2 turns activity      Wheel 150 feet activity       Cognition Comprehension Comprehension assist level: Follows complex conversation/direction with no assist  Expression Expression assist level: Expresses complex ideas: With no assist  Social Interaction Social Interaction assist level: Interacts appropriately with others - No medications needed.  Problem Solving Problem solving assist level: Solves complex problems: Recognizes & self-corrects  Memory Memory assist level: Complete Independence: No helper  Therapy/Group: Individual Therapy  Canary Brim Ivory Broad, PT, DPT  09/29/2014, 9:35 AM

## 2014-09-29 NOTE — Progress Notes (Signed)
[facility] PHYSICAL MEDICINE & REHABILITATION     PROGRESS NOTE    Subjective/Complaints: Had a good day with therapies. Still a little restless at night. Denies pain. Loose stools persist  ROS: Pt denies fever, rash/itching, headache, blurred or double vision, nausea, vomiting, abdominal pain, chest pain, shortness of breath, palpitations, dysuria, dizziness, neck or back pain, bleeding, or depression   Objective: Vital Signs: Blood pressure 132/86, pulse 98, temperature 97.9 F (36.6 C), temperature source Oral, resp. rate 18, height 5' 1"  (1.549 m), weight 71.351 kg (157 lb 4.8 oz), SpO2 98 %. No results found.  Recent Labs  09/27/14 0341  WBC 6.5  HGB 10.4*  HCT 30.8*  PLT 134*    Recent Labs  09/27/14 0341 09/28/14 0643  NA 133* 132*  K 4.1 3.7  CL 95* 93*  GLUCOSE 94 100*  BUN 61* 62*  CREATININE 4.45* 4.33*  CALCIUM 7.8* 7.9*   CBG (last 3)  No results for input(s): GLUCAP in the last 72 hours.  Wt Readings from Last 3 Encounters:  09/29/14 71.351 kg (157 lb 4.8 oz)  09/27/14 71.804 kg (158 lb 4.8 oz)  05/16/14 70.217 kg (154 lb 12.8 oz)    Physical Exam:  Constitutional: She is oriented to person, place, and time.  HENT: oral mucosa pink and moist Head: Normocephalic.  Eyes: EOM are normal.  Neck: Normal range of motion. Neck supple. No thyromegaly present.  Cardiovascular:  Cardiac rate controlled, irregularly irregular  Respiratory: Effort normal and breath sounds normal. No respiratory distress.  GI: Soft. Bowel sounds are normal. She exhibits no distension. There is no tenderness.  Neurological: She is alert and oriented to person, place, and time.  Motor strength is 4/5 bilateral deltoids, biceps, triceps, grip, hip flexor, knee extensor, ankle dorsiflexor and plantar flexor, DTR's 2+ Sensation is intact to light touch in bilateral upper and lower limbs Mood and affect are appropriate There is no evidence of dysarthria, good  phonation. Coordination appears normal  Good insight and awareness.  Skin: intact, warm---mild redness/excoriation around right buttock and gluteal folds---improving Psych: pleasant and appropriate  Assessment/Plan: 1. Functional deficits secondary to debility after multiple medical which require 3+ hours per day of interdisciplinary therapy in a comprehensive inpatient rehab setting. Physiatrist is providing close team supervision and 24 hour management of active medical problems listed below. Physiatrist and rehab team continue to assess barriers to discharge/monitor patient progress toward functional and medical goals.  Function:  Bathing Bathing position   Position: Shower  Bathing parts Body parts bathed by patient: Right arm, Right upper leg, Left arm, Left upper leg, Abdomen, Chest, Right lower leg, Front perineal area, Left lower leg, Buttocks, Back    Bathing assist Assist Level: Touching or steadying assistance(Pt > 75%)      Upper Body Dressing/Undressing Upper body dressing   What is the patient wearing?: Hospital gown (No personal clothing available on eval)                Upper body assist        Lower Body Dressing/Undressing Lower body dressing   What is the patient wearing?: Non-skid slipper socks (No personal clothing available on eval)         Non-skid slipper socks- Performed by patient: Don/doff right sock, Don/doff left sock                    Lower body assist Assist Level: Set up   Set up : To obtain  clothing/put away  Toileting Toileting   Toileting steps completed by patient: Adjust clothing prior to toileting, Performs perineal hygiene, Adjust clothing after toileting   Toileting Assistive Devices: Grab bar or rail  Toileting assist Assist level: Touching or steadying assistance (Pt.75%)   Transfers Chair/bed transfer   Chair/bed transfer method: Ambulatory Chair/bed transfer assist level: Touching or steadying assistance  (Pt > 75%) Chair/bed transfer assistive device: Careers adviser device: Walker-rolling   Assist level: Touching or steadying assistance (Pt > 75%)   Wheelchair   Type: Manual Max wheelchair distance: 100 Assist Level: Touching or steadying assistance (Pt > 75%)  Cognition Comprehension Comprehension assist level: Follows complex conversation/direction with no assist  Expression Expression assist level: Expresses complex ideas: With no assist  Social Interaction Social Interaction assist level: Interacts appropriately with others - No medications needed.  Problem Solving Problem solving assist level: Solves complex problems: Recognizes & self-corrects  Memory Memory assist level: Complete Independence: No helper   Medical Problem List and Plan: 1. Functional deficits secondary to debilitation related to sepsis with acute renal failure 2. DVT Prophylaxis/Anticoagulation: SCDs. oob 3. Pain Management: Tylenol as needed 4. Acute renal failure secondary to ischemic ATN.  -labs stable.   -lasix diuresis per nephrology 5. Neuropsych: This patient is capable of making decisions on her own behalf. 6. Skin/Wound Care: Routine skin checks. Fungal powder, oob, pressure relief to buttocks 7. Fluids/Electrolytes/Nutrition: encourage PO, follow up bmet tomorrow 8. ID/sepsis/liver mass. All antibiotics currently discontinued  9. Hypertension/atrial fibrillation. Continue amiodarone 200 mg daily, Coreg 3.125 mg twice a day.  -resume eliquis at some point 10. Systolic congestive heart failure. Monitor for any signs of fluid overload, daily weights  -lasix 11. Hyperlipidemia. Lipitor 12. Loose stool: fiber and probiotic added  -encourage po  .   LOS (Days) 2 A FACE TO FACE EVALUATION WAS PERFORMED  SWARTZ,ZACHARY T 09/29/2014 7:25 AM

## 2014-09-29 NOTE — Progress Notes (Signed)
Assessment/Plan: #1 Nonoliguric acute kidney injury: Likely hemodynamic ACE inhibitor use. Creatinine 4.1, improved from 4.3 yesterday. Continues to make good urine output. Continue current Lasix dosing.  Renal Attending: [name] with note above. Will continue supportive care and diurese. Sara Russell C    Subjective: Tolerating rehabilitation well.  Objective: Vital signs in last 24 hours: Temp:  [97.9 F (36.6 C)-98.1 F (36.7 C)] 97.9 F (36.6 C) (08/18 0520) Pulse Rate:  [95-98] 98 (08/18 0520) Resp:  [18] 18 (08/18 0520) BP: (110-132)/(67-86) 132/86 mmHg (08/18 0520) SpO2:  [95 %-98 %] 98 % (08/18 0520) Weight:  [157 lb 4.8 oz (71.351 kg)] 157 lb 4.8 oz (71.351 kg) (08/18 0520) Weight change: -2 lb 4.8 oz (-1.043 kg)  Intake/Output from previous day: 08/17 0701 - 08/18 0700 In: 600 [P.O.:600] Out: -  Intake/Output this shift: Total I/O In: 240 [P.O.:240] Out: -   General appearance: alert and cooperative Resp: clear to auscultation bilaterally Cardio: irregularly irregular rhythm Extremities: edema 1+  Lab Results:  Recent Labs  09/27/14 0341  WBC 6.5  HGB 10.4*  HCT 30.8*  PLT 134*   BMET:   Recent Labs  09/28/14 0643 09/29/14 0800  NA 132* 138  K 3.7 4.3  CL 93* 97*  CO2 28 31  GLUCOSE 100* 101*  BUN 62* 61*  CREATININE 4.33* 4.12*  CALCIUM 7.9* 8.4*    Scheduled: . amiodarone  200 mg Oral Daily  . apixaban  2.5 mg Oral BID  . atorvastatin  10 mg Oral q1800  . carvedilol  6.25 mg Oral BID WC  . feeding supplement (NEPRO CARB STEADY)  237 mL Oral TID WC  . furosemide  80 mg Oral BID  . iron polysaccharides  150 mg Oral Q1200  . nystatin   Topical BID  . pantoprazole  40 mg Oral Daily  . polycarbophil  625 mg Oral BID  . potassium chloride  10 mEq Oral BID  . saccharomyces boulardii  250 mg Oral BID  . triamcinolone cream   Topical BID    LOS: 2 days   Posey Russell, Sara 09/29/2014,11:33 AM

## 2014-09-29 NOTE — Progress Notes (Signed)
Occupational Therapy Session Note  Patient Details  Name: Sara Russell MRN: [id number] Date of Birth: April 09, 1930  Today's Date: 09/29/2014 OT Individual Time: 1030-1145 and 1300-1400 OT Individual Time Calculation (min): 75 min and 60 min   Short Term Goals: Week 1:  OT Short Term Goal 1 (Week 1): STG=LTG due to ELOS  Skilled Therapeutic Interventions/Progress Updates:    Session One: Pt seen for OT session focusing on UE/ LE strengthening, functional activity tolerance, and standing balance. Pt in w/c upon arrival, having already completed bathing/ dressing task. She ambulated down hallway using RW and CGA- supervision, requiring frequent seated rest breaks. Pt educated regarding energy conservation techniques. In therapy gym, pt completed UE strengthening exercises using #2 dowel and #1 weighted ball, completing shoulder flexion, diagonals, and bicep curls. Each completed x2 set of 10 reps with verbal and tactile cues for proper form/technique. Pt educated regarding of importance of quality reps vs quantity as pt fatigued quickly during exercises and as a result form suffered. VCs for needed rest break. Pt voiced discomfort in R bicep, muscle knot felt by therapist, and kinesiotape allied. Education provided regarding use and purpose of kinesiotape.  She then completed 5x sit <> stand from mat, lowering mat on subsequent trials. Verbal and demonstration cues provided for proper form to assist with independence in standing, initially requiring min A to stand upgrading to supervision with cues for "nose over toes". Pt returned to room at end of session, initially ambulating with RW and then sat and propelled w/c remainder of way. Pt educated regarding use of w/c pushups for pressure relief- voicing and demonstrating understand.  Pt required max cues throughout for w/c parts management during functional transfers.    Session Two: Pt seen for OT therapy session focusing on functional mobility,  functional activity tolerance, and functional transfers. Pt in supine upon arrival, voicing increased fatigue, however, agreeable to attempt therapy. Pt required VCs for bed mobility techniques, able to complete with supervision following cues. Pt self propelled w/c throughout unit for UE strengthening and activity tolerance. In therapy day room, pt watered plants, standing and ambulating with RW and close supervision. Pt demonstrated functional RW management skills while completing task.  Pt practiced simulated shower stall transfers with RW and min steadying assist to shower chair. VCs provided for technique and safety awareness. Pt ambulated back to room at end of session, requesting return to supine. Pt left in supine at end of session, all needs in reach.  Pt educated throughout session regarding w/c parts management, RW management, energy conservation, and d/c planning.   Therapy Documentation Precautions:  Precautions Precautions: Fall Precaution Comments: decreased endurance Restrictions Weight Bearing Restrictions: No Pain:  No/denies pain  Function:   Eating Eating   Eating Assist Level: No help, No cues                                                                Upper Body Dressing/Undressing Upper body dressing   What is the patient wearing?: Bra;Pull over shirt/dress Bra - Perfomed by patient: Thread/unthread right bra strap;Thread/unthread left bra strap;Hook/unhook bra (pull down sports bra)   Pull over shirt/dress - Perfomed by patient: Thread/unthread right sleeve;Thread/unthread left sleeve;Put head through opening;Pull shirt over trunk  Upper body assist Assist Level: More than reasonable time       Lower Body Dressing/Undressing Lower body dressing   What is the patient wearing?: Underwear;Socks;Shoes;Pants Underwear - Performed by patient: Pull underwear up/down Underwear - Performed by helper: Thread/unthread right underwear  leg;Thread/unthread left underwear leg (due to putting over shoes PT assisted pt) Pants- Performed by patient: Thread/unthread left pants leg;Pull pants up/down (due to shoes on already -PT assisted for time) Pants- Performed by helper: Thread/unthread right pants leg     Socks - Performed by patient: Don/doff right sock;Don/doff left sock   Shoes - Performed by patient: Don/doff left shoe;Don/doff right shoe            Lower body assist Assist Level: Set up   Set up : To obtain clothing/put away   Toileting Toileting   Toileting steps completed by patient: Adjust clothing prior to toileting;Performs perineal hygiene;Adjust clothing after toileting   Toileting Assistive Devices: Grab bar or rail  Toileting assist Assist level: Touching or steadying assistance (Pt.75%)    Bed Mobility Roll left and right activity   Assist level: Supervision or verbal cues  Sit to lying activity   Assist level: Supervision or verbal cues  Lying to sitting activity   Assist level: Supervision or verbal cues  Mobility details Bed mobility details: Verbal cues for techniques   Transfers Sit to stand transfer   Sit to stand assist level: Touching or steadying assistance (Pt > 75%/lift 1 leg) Sit to stand assistive device: Walker;Armrests  Chair/bed transfer   Chair/bed transfer method: Ambulatory Chair/bed transfer assist level: Touching or steadying assistance (Pt > 75%) Chair/bed transfer assistive device: Walker   Chair/bed transfer details: Verbal cues for technique;Verbal cues for safe use of DME/AE;Manual facilitation for weight shifting         Cognition Comprehension Comprehension assist level: Follows complex conversation/direction with no assist  Expression Expression assist level: Expresses complex ideas: With no assist  Social Interaction Social Interaction assist level: Interacts appropriately with others - No medications needed.  Problem Solving Problem solving assist level:  Solves complex problems: Recognizes & self-corrects  Memory Memory assist level: Complete Independence: No helper    Therapy/Group: Individual Therapy  Lewis, Aaleyah Witherow C 09/29/2014, 12:07 PM

## 2014-09-30 ENCOUNTER — Inpatient Hospital Stay (HOSPITAL_COMMUNITY): Payer: Medicare Other | Admitting: Occupational Therapy

## 2014-09-30 ENCOUNTER — Inpatient Hospital Stay (HOSPITAL_COMMUNITY): Payer: Medicare Other

## 2014-09-30 LAB — RENAL FUNCTION PANEL
Albumin: 2.2 g/dL — ABNORMAL LOW (ref 3.5–5.0)
Anion gap: 11 (ref 5–15)
BUN: 59 mg/dL — ABNORMAL HIGH (ref 6–20)
CO2: 31 mmol/L (ref 22–32)
Calcium: 8 mg/dL — ABNORMAL LOW (ref 8.9–10.3)
Chloride: 94 mmol/L — ABNORMAL LOW (ref 101–111)
Creatinine, Ser: 4.12 mg/dL — ABNORMAL HIGH (ref 0.44–1.00)
GFR calc Af Amer: 11 mL/min — ABNORMAL LOW (ref >60)
GFR calc non Af Amer: 9 mL/min — ABNORMAL LOW (ref >60)
Glucose, Bld: 100 mg/dL — ABNORMAL HIGH (ref 65–99)
Phosphorus: 5.3 mg/dL — ABNORMAL HIGH (ref 2.5–4.6)
Potassium: 3.8 mmol/L (ref 3.5–5.1)
Sodium: 136 mmol/L (ref 135–145)

## 2014-09-30 MED ORDER — FUROSEMIDE 40 MG PO TABS
40.0000 mg | ORAL_TABLET | Freq: Two times a day (BID) | ORAL | Status: DC
  Administered 2014-09-30 – 2014-10-01 (×2): 40 mg via ORAL
  Filled 2014-09-30 (×2): qty 1

## 2014-09-30 NOTE — Progress Notes (Signed)
Social Work  Social Work Assessment and Plan  Patient Details  Name: Sara Russell MRN: [id number] Date of Birth: April 29, 1930  Today's Date: 09/30/2014  Problem List:  Patient Active Problem List   Diagnosis Date Noted  . Physical debility 09/27/2014  . Respiratory distress   . Encounter for palliative care   . ARF (acute renal failure)   . Sepsis   . FUO (fever of unknown origin)   . Liver mass, left lobe   . Septic shock   . AKI (acute kidney injury) 09/15/2014  . SOB (shortness of breath) 09/15/2014  . Elevated troponin   . Chronic diastolic congestive heart failure   . Elevated WBC count   . Urinary tract infectious disease   . Blood poisoning   . Osteopenia of the elderly 05/13/2014  . Abdominal mass of other site 05/13/2014  . Diastolic CHF 78/29/5621  . Gout 08/16/2013  . Hyponatremia 08/10/2013  . Left atrial enlargement 01/10/2013  . A-fib 12/08/2012  . Rosacea 05/15/2012  . Breast cancer screening 03/14/2011  . Anemia due to chronic blood loss 08/28/2010  . Grief 04/29/2010  . DEGENERATIVE DISC DISEASE, LUMBAR SPINE 07/14/2009  . DVT 07/11/2009  . GERD 07/11/2009  . PERSONAL HX COLONIC POLYPS 07/11/2009  . HYPERCHOLESTEROLEMIA 04/10/2006  . HYPERTENSION, BENIGN SYSTEMIC 04/10/2006  . Pulmonary embolism and infarction 04/10/2006  . DIVERTICULOSIS OF COLON 04/10/2006  . ACTINIC KERATOSIS 04/10/2006  . GEN OSTEOARTHROSIS INVOLVING MULTIPLE SITES 04/10/2006   Past Medical History:  Past Medical History  Diagnosis Date  . S/P dilatation of esophageal stricture 2002  . Hypertension   . Hyperlipidemia   . DVT (deep venous thrombosis) 2009  . PE (pulmonary embolism) 2009  . GERD (gastroesophageal reflux disease)   . Diverticulosis   . Iron deficiency anemia   . Hyperplastic colon polyp   . Positive H. pylori test 01/20/96  . Degenerative joint disease   . Skin cancer     h/o basal and squamous skin cancer--> removed  . Persistent atrial fibrillation    . Shortness of breath dyspnea   . AKI (acute kidney injury) 09/15/2014  . Colitis, ulcerative    Past Surgical History:  Past Surgical History  Procedure Laterality Date  . Dilation and curettage of uterus      for endometrial polyps  . Replacement total knee      left  . Tonsillectomy    . Appendectomy    . Breast lumpectomy      benign  . Joint replacement     Social History:  reports that she quit smoking about 53 years ago. Her smoking use included Cigarettes. She started smoking about 66 years ago. She has a 6.5 pack-year smoking history. She has never used smokeless tobacco. She reports that she drinks alcohol. She reports that she does not use illicit drugs.  Family / Support Systems Marital Status: Widow/Widower Patient Roles: Parent, Other (Comment) (grandmother) Children: son, Terrance Mass) @ [phone];  [name] [facility]) @ (C) [phone] and Ronalee Belts [facility]) @ [phone] Anticipated Caregiver: Pt hopes to d[place] new [place] apt [place], however, sons are very supportive Ability/Limitations of Caregiver: could d/c home with one of her sons if not able to d/c directly to [facility] Caregiver Availability: Intermittent Family Dynamics: Sons are very supportive and report that pt has stayed with them in the past when needed.    Social History Preferred language: English Religion: Catholic Cultural Background: NA Read: Yes Write: Yes Employment Status: Retired  Legal Hisotry/Current Legal Issues: None Guardian/Conservator: None - per MD, pt capable of making decisions on her own behalf   Abuse/Neglect Physical Abuse: Denies Verbal Abuse: Denies Sexual Abuse: Denies Exploitation of patient/patient's resources: Denies Self-Neglect: Denies  Emotional Status Pt's affect, behavior adn adjustment status: Pt very pleasant, talkative and stresses that she has "...always been very independent.Marland KitchenMarland KitchenI was born on the 4th of July you know...".  She is pleased with  progress made so far.  Denies any s/s of depression or anxiety - will defer any formal depression screen at this time as not indicated. Recent Psychosocial Issues: None Pyschiatric History: None Substance Abuse History: None  Patient / Family Perceptions, Expectations & Goals Pt/Family understanding of illness & functional limitations: Pt and family with good understanding of her sepsis and medical decline and now physical limitations/ need for CIR. Premorbid pt/family roles/activities: Pt was independent at home.  Still driving. Anticipated changes in roles/activities/participation: Little change anticipated except with regards to endurance.  Pt plans to d[place]c to [place] or [place] @ [facility]. Pt/family expectations/goals: "I need to be able to do things for myself."  [employer]: None Premorbid Home Care/DME Agencies: None Transportation available at discharge: yes  Discharge Planning Living Arrangements: Alone Support Systems: Children Type of Residence: Private residence Insurance Resources: [employer], Multimedia programmer (specify) Financial Resources: [place] Referred: No Living Expenses: Own Money Management: Patient Does the patient have any problems obtaining your medications?: No Home Management: independent Patient/Family Preliminary Plans: Pt plans to d[place]c to either [place] or [place] apt at [facility] upon d/c.  She had completed paperwork for move one day prior to hospitalization. Social Work Anticipated Follow Up Needs: HH/OP Expected length of stay: 7-10 days  Clinical Impression Very pleasant woman here following medical decline and now deconditioned.  Very motivated to regain her independence.  Plan to d/c directly to [facility] as she had just completed move in paperwork one day prior to hospitalization.  Family very supportive and to assist with d/c transition.  Following for d/c planning needs.   Joeleen Wortley 09/30/2014,  3:19 PM

## 2014-09-30 NOTE — Progress Notes (Signed)
Assessment/Plan: #1 Nonoliguric acute kidney injury: Likely hemodynamic ACE inhibitor use. Creatinine stable at 4 today. Decreased Lasix to 5m twice daily.   Subjective: Tolerating rehabilitation well. She reports improved swelling and no troubles with urinating though uncharted.  Objective: Vital signs in last 24 hours: Temp:  [97.5 F (36.4 C)-98.1 F (36.7 C)] 98.1 F (36.7 C) (08/19 0603) Pulse Rate:  [80-97] 80 (08/19 0603) Resp:  [18] 18 (08/19 0603) BP: (92-125)/(55-81) 92/55 mmHg (08/19 0603) SpO2:  [94 %-97 %] 94 % (08/19 0603) Weight:  [157 lb 3.2 oz (71.305 kg)] 157 lb 3.2 oz (71.305 kg) (08/19 0603) Weight change: -1.6 oz (-0.045 kg)  Intake/Output from previous day: 08/18 0701 - 08/19 0700 In: 1200 [P.O.:1200] Out: -  Intake/Output this shift: Total I/O In: 720 [P.O.:720] Out: -   General: Elderly Caucasian female, ambulating with walker, NAD Cardiac: RRR, no rubs, murmurs or gallops Pulm: clear to auscultation bilaterally, no wheezes, rales, or rhonchi Ext: warm and well perfused, no pedal edema Neuro: responds to questions appropriately; moving all extremities freely   BMET:   Recent Labs  09/29/14 0800 09/30/14 0527  NA 138 136  K 4.3 3.8  CL 97* 94*  CO2 31 31  GLUCOSE 101* 100*  BUN 61* 59*  CREATININE 4.12* 4.12*  CALCIUM 8.4* 8.0*    Scheduled: . amiodarone  200 mg Oral Daily  . apixaban  2.5 mg Oral BID  . atorvastatin  10 mg Oral q1800  . carvedilol  6.25 mg Oral BID WC  . feeding supplement (NEPRO CARB STEADY)  237 mL Oral TID WC  . furosemide  40 mg Oral BID  . iron polysaccharides  150 mg Oral Q1200  . nystatin   Topical BID  . pantoprazole  40 mg Oral Daily  . polycarbophil  625 mg Oral BID  . potassium chloride  10 mEq Oral BID  . saccharomyces boulardii  250 mg Oral BID  . triamcinolone cream   Topical BID    LOS: 3 days   PPosey Pronto [name] 09/30/2014,1:55 PM    Renal Attending: I agree with the note and evaluation as  planned above.  I have taken a history today and examined the patient. Iyesha Such C

## 2014-09-30 NOTE — Progress Notes (Signed)
Occupational Therapy Session Note  Patient Details  Name: Sara Russell MRN: [id number] Date of Birth: [birth year]  Today's Date: 09/30/2014 OT Individual Time: 1100-1200 and 1430-1500 OT Individual Time Calculation (min): 60 min and 30 min   Short Term Goals: Week 1:  OT Short Term Goal 1 (Week 1): STG=LTG due to ELOS  Skilled Therapeutic Interventions/Progress Updates:    Session One: Pt seen for OT ADL bathing and dressing session. Pt in supine upon arrival, agreeable to tx session. Pt with questions regarding d/c disposition. Education provided regarding OT goals and team approach to therapy and d/c decision making, all questions answered. Pt ambulated throughout room with RW to gather clothing in prep for bathing/ dressing session. Steadying assist and VCs provided for technique to bend down to reach low drawers. See below for details of required assist for bathing/dressing. Pt completed grooming tasks both from w/c level and standing at sink in order to increase functional standing endurance. Pt left sitting up in w/c at end of session, all needs in reach.   Session Two: Pt seen for OT therapy session focusing on functional mobility, functional activity tolerance and IADL re-training. Pt in supine upon arrival. She transferred into w/c with supervision ambulating with RW. She self propelled w/c to ADL apartment for UE strengthening. Pt ambulated throughout kitchen using RW and supervision to simulate making pot of coffee as she would at home, boiling water on the stove. Pt required VCs for RW management and safety when completing functional standing tasks. She demonstrated good problem solving ability to move pots along counter top while ambulating with RW. Education provided regarding kitchen set-up and keeping commonly used items on easy to reach surfaces. Pt had 1 seated rest break during session, recommendation to keep chair in kitchen for rest breaks/ energy conservation upon d/c, pt  voiced understanding. Pt ambulated back to room at end of session, requesting return to bed. Pt left in supine at end of session, all needs in reach. Pt educated regarding OT progress, OT goals, POC, energy conservation, and d/c planning.   Therapy Documentation Precautions:  Precautions Precautions: Fall Precaution Comments: decreased endurance Restrictions Weight Bearing Restrictions: No Pain:  No/denies pain    Function:   Eating Eating   Eating Assist Level: No help, No cues           Grooming Oral Care,Brush Teeth, Clean Dentures Activity:      Assist Level: Set up   Set up : To obtain items  Wash, Rinse, Dry Face Activity          Wash, Rinse, Dry Hands Activity          Brush, Comb Hair Activity   Assist Level: Supervision or verbal cues (Completed in standing)    Shave Activity          Apply Makeup Activity                                                             Bathing Bathing position      Bathing parts Body parts bathed by patient: Right arm;Right upper leg;Left arm;Left upper leg;Abdomen;Chest;Right lower leg;Front perineal area;Left lower leg;Buttocks;Back    Bathing assist Assist Level: Supervision or verbal cues       Upper Body Dressing/Undressing Upper body dressing  What is the patient wearing?: Pull over shirt/dress     Pull over shirt/dress - Perfomed by patient: Thread/unthread right sleeve;Thread/unthread left sleeve;Put head through opening;Pull shirt over trunk          Upper body assist Assist Level: More than reasonable time       Lower Body Dressing/Undressing Lower body dressing   What is the patient wearing?: Underwear;Pants;Socks;Shoes Underwear - Performed by patient: Pull underwear up/down;Thread/unthread right underwear leg;Thread/unthread left underwear leg   Pants- Performed by patient: Thread/unthread right pants leg;Thread/unthread left pants leg;Pull pants up/down;Fasten/unfasten pants    Non-skid slipper socks- Performed by patient: Don/doff right sock;Don/doff left sock   Socks - Performed by patient: Don/doff right sock;Don/doff left sock   Shoes - Performed by patient: Don/doff left shoe;Don/doff right shoe            Lower body assist Assist Level: Set up   Set up : To obtain clothing/put away   Toileting Toileting   Toileting steps completed by patient: Adjust clothing prior to toileting;Performs perineal hygiene;Adjust clothing after toileting   Toileting Assistive Devices: Grab bar or rail;Other (comment) (RW)  Toileting assist Assist level: Touching or steadying assistance (Pt.75%)    Bed Mobility Roll left and right activity      Sit to lying activity      Lying to sitting activity   Assist level: Supervision or verbal cues  Mobility details     Transfers Sit to stand transfer   Sit to stand assist level: Touching or steadying assistance (Pt > 75%/lift 1 leg) Sit to stand assistive device: Walker;Armrests  Chair/bed transfer   Chair/bed transfer method: Ambulatory Chair/bed transfer assist level: Touching or steadying assistance (Pt > 75%) Chair/bed transfer assistive device: Armrests;Walker   Chair/bed transfer details: Verbal cues for Health visitor transfer assistive device: Grab bar;Walker       Assist level to toilet: Touching or steadying assistance (Pt > 75%) Assist level from toilet: Moderate assist (Pt 50 - 74%/lift or lower)  Tub/shower transfer   Tub/shower assistive device: Engineer, structural   Assist level into tub: Touching or steadying assistance (Pt > 75%/lift 1 leg) Assist level out of tub: Supervision or verbal cues   Cognition Comprehension Comprehension assist level: Follows complex conversation/direction with no assist  Expression Expression assist level: Expresses complex ideas: With no assist  Social Interaction Social Interaction assist level: Interacts appropriately with others - No  medications needed.  Problem Solving Problem solving assist level: Solves basic problems with no assist  Memory Memory assist level: Recognizes or recalls 90% of the time/requires cueing < 10% of the time    Therapy/Group: Individual Therapy  Lewis, Estephanie Hubbs C 09/30/2014, 12:03 PM

## 2014-09-30 NOTE — Progress Notes (Signed)
Physical Therapy Session Note  Patient Details  Name: Sara Russell MRN: [id number] Date of Birth: 1930-04-28  Today's Date: 09/30/2014 PT Individual Time: 0900-1000 PT Individual Time Calculation (min): 60 min   Short Term Goals: Week 1:  PT Short Term Goal 1 (Week 1): =LTG's due to ELOS  Skilled Therapeutic Interventions/Progress Updates:   Denies pain. Session focused on functional transfers (see details below) with focus on technique, gait training for functional mobility and endurance training with RW x 80' x 80' x 150' with cues for posture and S for overall safety, dynamic gait through obstacle course to simulate household and community environment including navigating obstacles, stepping over object and walking on uneven surface at S level with increased time needed, stair negotiation training for functional strengthening and community mobility training with cues for technique (see details below). Pt overall making good progress though still requires S level cues for hand placement and technique for basic transfers. Continue PT POC.  Therapy Documentation Precautions:  Precautions Precautions: Fall Precaution Comments: decreased endurance Restrictions Weight Bearing Restrictions: No   Function:  Herbalist Devices: Grab bar or rail;Other (comment) (RW)   Bed Mobility Roll left and right activity      Sit to lying activity      Lying to sitting activity     Mobility details     Transfers Sit to stand transfer   Sit to stand assist level: Touching or steadying assistance (Pt > 75%/lift 1 leg) Sit to stand assistive device: Walker;Armrests  Chair/bed transfer   Chair/bed transfer method: Ambulatory Chair/bed transfer assist level: Touching or steadying assistance (Pt > 75%) Chair/bed transfer assistive device: Armrests;Walker   Chair/bed transfer details: Verbal cues for Animal nutritionist transfer assistive  device: Estate agent device: Walker-rolling Max distance: 150 Assist level: Supervision or verbal cues  Walk 10 feet activity   Assist level: Supervision or verbal cues  Walk 50 feet with 2 turns activity   Assist level: Touching or steadying assistance (Pt > 75%)  Walk 150 feet activity   Assist level: Supervision or verbal cues  Walk 10 feet on uneven surfaces activity   Assist level: Supervision or verbal cues  Stairs   Stairs assistive device: 1 hand rail;2 hand rails Max number of stairs: 4 Stairs assist level: Touching or steadying assistance (Pt > 75%)  Walk up/down 1 step activity        Walk up/down 4 steps activity   Walk up/down 4 steps assist level: Touching or steadying assistance (Pt > 75%)  Walk up/down 12 steps activity      Pick up small objects from floor      Wheelchair       Assist Level: Dependent (Pt equals 0%)  Wheel 50 feet with 2 turns activity      Wheel 150 feet activity       Cognition Comprehension Comprehension assist level: Follows complex conversation/direction with no assist  Expression Expression assist level: Expresses complex ideas: With no assist  Social Interaction Social Interaction assist level: Interacts appropriately with others - No medications needed.  Problem Solving Problem solving assist level: Solves basic problems with no assist  Memory Memory assist level: Recognizes or recalls 90% of the time/requires cueing < 10% of the time    Therapy/Group: Individual Therapy  Canary Brim Skeet Simmer  Atlee Abide, PT, DPT  09/30/2014, 12:00 PM

## 2014-09-30 NOTE — Care Management Note (Signed)
[facility]patient [facility] Individual Statement of Services  Patient Name:  Sara Russell  Date:  09/30/2014  Welcome to the [facility].  Our goal is to provide you with an individualized program based on your diagnosis and situation, designed to meet your specific needs.  With this comprehensive rehabilitation program, you will be expected to participate in at least 3 hours of rehabilitation therapies Monday-Friday, with modified therapy programming on the weekends.  Your rehabilitation program will include the following services:  Physical Therapy (PT), Occupational Therapy (OT), 24 hour per day rehabilitation nursing, Therapeutic Recreaction (TR), Case Management (Social Worker), Rehabilitation Medicine, Nutrition Services and Pharmacy Services  Weekly team conferences will be held on Tuesdays to discuss your progress.  Your Social Worker will talk with you frequently to get your input and to update you on team discussions.  Team conferences with you and your family in attendance may also be held.  Expected length of stay: 7-10 days  Overall anticipated outcome: modified independent  Depending on your progress and recovery, your program may change. Your Social Worker will coordinate services and will keep you informed of any changes. Your Social Worker's name and contact numbers are listed  below.  The following services may also be recommended but are not provided by the [facility] will be made to provide these services after discharge if needed.  Arrangements include referral to agencies that provide these services.  Your insurance has been verified to be:  Medicare and GHI Your primary doctor is:  Dr. Madison Hickman  Pertinent information will be shared with your doctor and your insurance company.  Social Worker:  [place], [place] or ([phone]   Information discussed with and copy given to patient by: Lennart Pall, 09/30/2014, 2:36 PM

## 2014-09-30 NOTE — IPOC Note (Signed)
Overall Plan of Care [facility] Patient Details Name: Sara Russell MRN: [id number] DOB: Jan 21, 1931  Admitting Diagnosis: Deconditioned  Hospital Problems: Principal Problem:   Physical debility Active Problems:   A-fib   Diastolic CHF   Chronic diastolic congestive heart failure   Septic shock   ARF (acute renal failure)     Functional Problem List: Nursing Bladder, Bowel, Endurance, Edema, Medication Management, Nutrition, Motor, Pain, Safety, Skin Integrity  PT Balance, Endurance, Motor, Safety  OT Balance, Endurance, Motor, Safety  SLP    TR Endurance       Basic ADL's: OT Dressing, Toileting, Bathing     Advanced  ADL's: OT Simple Meal Preparation, Laundry     Transfers: PT Bed Mobility, Bed to Chair, [employer], Teacher, early years/pre, Metallurgist: PT Ambulation, Stairs     Additional Impairments: OT    SLP        TR      Anticipated Outcomes Item Anticipated Outcome  Self Feeding Independent  Swallowing      Basic self-care  Mod I  Toileting  Mod I   Bathroom Transfers Mod I  Bowel/Bladder  Pt will remain continent of bowel and bladder with mod I assistance   Transfers  mod I   Locomotion  Mod I from ambulatory level household (may need w/c for community)   Communication     Cognition     Pain  Pt will report pain at 4 or less on a scale of 0-10.   Safety/Judgment  Pt will remain free of falls and injury with mod I assitance   Therapy Plan: PT Intensity: Minimum of 1-2 x/day ,45 to 90 minutes PT Frequency: 5 out of 7 days PT Duration Estimated Length of Stay: 7-10 days OT Intensity: Minimum of 1-2 x/day, 45 to 90 minutes OT Frequency: 5 out of 7 days OT Duration/Estimated Length of Stay: 7-10 days         Team Interventions: Nursing Interventions Patient/Family Education, Bladder Management, Bowel Management, Disease Management/Prevention, Pain Management, Medication Management, Skin Care/Wound Management, Discharge Planning,  Psychosocial Support  PT interventions    OT Interventions Balance/vestibular training, Cognitive remediation/compensation, Community reintegration, Discharge planning, DME/adaptive equipment instruction, Patient/family education, Self Care/advanced ADL retraining, Therapeutic Activities, Therapeutic Exercise, UE/LE Strength taining/ROM  SLP Interventions    TR Interventions    SW/CM Interventions Discharge Planning, Psychosocial Support, Patient/Family Education    Team Discharge Planning: Destination: PT-Assisted Living ,OT- Assisted Living , SLP-  Projected Follow-up: PT-Home health PT, Outpatient PT (depending if she can get to OP), OT-  None, SLP-  Projected Equipment Needs: PT-To be determined, OT- None recommended by OT, SLP-  Equipment Details: PT- , OT-  Patient/family involved in discharge planning: PT- Patient,  OT-Patient, SLP-   MD ELOS: 7-11d Medical Rehab Prognosis:  Good Assessment: 81 year old female admitted to the hospital for suspected sepsis and acute renal failure. She now requires acute inpatient rehabilitation for her medical and functional issues related to her acute illness.  She is requiring 24/7 Rehab RN,MD, as well as CIR level PT, OT .  Treatment team will focus on ADLs and mobility with goals set at Modified independent   See Team Conference Notes for weekly updates to the plan of care

## 2014-09-30 NOTE — Progress Notes (Signed)
[facility]     PROGRESS NOTE    Subjective/Complaints: Feeling well. Appreciative of therapy. Stools less frequent yesterday. Denies pain.  ROS: Pt denies fever, rash/itching, headache, blurred or double vision, nausea, vomiting, abdominal pain, chest pain, shortness of breath, palpitations, dysuria, dizziness, neck or back pain, bleeding, or depression   Objective: Vital Signs: Blood pressure 92/55, pulse 80, temperature 98.1 F (36.7 C), temperature source Oral, resp. rate 18, height 5' 1"  (1.549 m), weight 71.305 kg (157 lb 3.2 oz), SpO2 94 %. No results found. No results for input(s): WBC, HGB, HCT, PLT in the last 72 hours.  Recent Labs  09/29/14 0800 09/30/14 0527  NA 138 136  K 4.3 3.8  CL 97* 94*  GLUCOSE 101* 100*  BUN 61* 59*  CREATININE 4.12* 4.12*  CALCIUM 8.4* 8.0*   CBG (last 3)  No results for input(s): GLUCAP in the last 72 hours.  Wt Readings from Last 3 Encounters:  09/30/14 71.305 kg (157 lb 3.2 oz)  09/27/14 71.804 kg (158 lb 4.8 oz)  05/16/14 70.217 kg (154 lb 12.8 oz)    Physical Exam:  Constitutional: She is oriented to person, place, and time.  HENT: oral mucosa pink and moist Head: Normocephalic.  Eyes: EOM are normal.  Neck: Normal range of motion. Neck supple. No thyromegaly present.  Cardiovascular:  Cardiac rate controlled, irregularly irregular  Respiratory: Effort normal and breath sounds normal--occ crackle at bases. No respiratory distress.  GI: Soft. Bowel sounds are normal. She exhibits no distension. There is no tenderness.  Neurological: She is alert and oriented to person, place, and time.  Motor strength is 4/5 bilateral deltoids, biceps, triceps, grip, hip flexor, knee extensor, ankle dorsiflexor and plantar flexor, DTR's 2+ Sensation is intact to light touch in bilateral upper and lower limbs Mood and affect are appropriate There is no evidence of dysarthria, good  phonation. Coordination appears normal  Good insight and awareness.  Skin: intact, warm---mild redness/excoriation around right buttock and gluteal folds---improving Psych: pleasant and appropriate  Assessment/Plan: 1. Functional deficits secondary to debility after multiple medical which require 3+ hours per day of interdisciplinary therapy in a comprehensive inpatient rehab setting. Physiatrist is providing close team supervision and 24 hour management of active medical problems listed below. Physiatrist and rehab team continue to assess barriers to discharge/monitor patient progress toward functional and medical goals.  Function:  Bathing Bathing position   Position: Shower  Bathing parts Body parts bathed by patient: Right arm, Right upper leg, Left arm, Left upper leg, Abdomen, Chest, Right lower leg, Front perineal area, Left lower leg, Buttocks, Back    Bathing assist Assist Level: Touching or steadying assistance(Pt > 75%)      Upper Body Dressing/Undressing Upper body dressing   What is the patient wearing?: Bra, Pull over shirt/dress Bra - Perfomed by patient: Thread/unthread right bra strap, Thread/unthread left bra strap, Hook/unhook bra (pull down sports bra)   Pull over shirt/dress - Perfomed by patient: Thread/unthread right sleeve, Thread/unthread left sleeve, Put head through opening, Pull shirt over trunk          Upper body assist Assist Level: More than reasonable time      Lower Body Dressing/Undressing Lower body dressing   What is the patient wearing?: Underwear, Socks, Shoes, Pants Underwear - Performed by patient: Pull underwear up/down Underwear - Performed by helper: Thread/unthread right underwear leg, Thread/unthread left underwear leg (due to putting over shoes PT assisted pt) Pants- Performed by  patient: Thread/unthread left pants leg, Pull pants up/down (due to shoes on already -PT assisted for time) Pants- Performed by helper:  Thread/unthread right pants leg Non-skid slipper socks- Performed by patient: Don/doff right sock, Don/doff left sock   Socks - Performed by patient: Don/doff right sock, Don/doff left sock   Shoes - Performed by patient: Don/doff left shoe, Don/doff right shoe            Lower body assist Assist Level: Set up   Set up : To obtain clothing/put away  Toileting Toileting   Toileting steps completed by patient: Adjust clothing prior to toileting, Performs perineal hygiene, Adjust clothing after toileting   Toileting Assistive Devices: Grab bar or rail  Toileting assist Assist level: Touching or steadying assistance (Pt.75%)   Transfers Chair/bed transfer   Chair/bed transfer method: Ambulatory Chair/bed transfer assist level: Touching or steadying assistance (Pt > 75%) Chair/bed transfer assistive device: Careers adviser device: Walker-rolling Max distance: 85 Assist level: Touching or steadying assistance (Pt > 75%)   Wheelchair   Type: Manual Max wheelchair distance: 100 Assist Level: Dependent (Pt equals 0%) (for time management)  Cognition Comprehension Comprehension assist level: Follows complex conversation/direction with no assist  Expression Expression assist level: Expresses complex ideas: With no assist  Social Interaction Social Interaction assist level: Interacts appropriately with others - No medications needed.  Problem Solving Problem solving assist level: Solves complex problems: Recognizes & self-corrects  Memory Memory assist level: Complete Independence: No helper   Medical Problem List and Plan: 1. Functional deficits secondary to debilitation related to sepsis with acute renal failure 2. DVT Prophylaxis/Anticoagulation: SCDs. ambulation 3. Pain Management: under control 4. Acute renal failure secondary to ischemic ATN.  -labs showing slow improvement  -lasix diuresis per nephrology 5. Neuropsych: This patient is  capable of making decisions on her own behalf. 6. Skin/Wound Care:  Fungal powder, oob, pressure relief to buttocks 7. Fluids/Electrolytes/Nutrition: encourage PO, follow up bmet tomorrow 8. ID/sepsis/liver mass. All antibiotics currently discontinued  9. Hypertension/atrial fibrillation. Continue amiodarone 200 mg daily, Coreg 3.125 mg twice a day.  -resume eliquis at some point 10. Systolic congestive heart failure: no signs of fluid overload. Weight 71kg  -lasix 11. Hyperlipidemia. Lipitor 12. Loose stool: fiber and probiotic added  -encourage po  .   LOS (Days) 3 A FACE TO FACE EVALUATION WAS PERFORMED  Jayshun Galentine T 09/30/2014 7:58 AM

## 2014-09-30 NOTE — Progress Notes (Signed)
Occupational Therapy Session Note  Patient Details  Name: Sara Russell MRN: [id number] Date of Birth: 02-16-1930  Today's Date: 09/30/2014 OT Individual Time: 1300-1400 OT Individual Time Calculation (min): 60 min    Short Term Goals: Week 1:  OT Short Term Goal 1 (Week 1): STG=LTG due to ELOS  Skilled Therapeutic Interventions/Progress Updates:    Treatment session with focus on activity tolerance and problem solving home management tasks in preparation for d/c.  Ambulated to ADL apt with RW and supervision with no LOB.  Discussed previous ways of completing home management tasks and began problem solving laundry, meal prep, and bed making tasks now with use of RW. Pt continued to report that she had no problems with these tasks, however when attempted short distance ambulation without RW pt required min assist and demonstrated decreased motor control and confidence with mobility.  Completed trial with Rollator in ADL apt with mobility around bed, into bathroom, and simulating transporting coffee cup on Rollator seat.  Educated on use of seat to transport items to increase safety as important to keep both hands on walker with mobility.  Plan to discuss with primary therapists RW vs Rollator.  Pt returned to room with RW and left semi-reclined in bed.  Therapy Documentation Precautions:  Precautions Precautions: Fall Precaution Comments: decreased endurance Restrictions Weight Bearing Restrictions: No General:   Vital Signs: Therapy Vitals Temp: 98.2 F (36.8 C) Temp Source: Oral Pulse Rate: 85 Resp: 18 BP: 105/85 mmHg Patient Position (if appropriate): Lying Oxygen Therapy SpO2: 98 % O2 Device: Not Delivered Pain:  Pt with no c/o pain    Therapy/Group: Individual Therapy  Simonne Come 09/30/2014, 3:17 PM

## 2014-09-30 NOTE — Plan of Care (Signed)
Problem: RH SKIN INTEGRITY Goal: RH STG SKIN FREE OF INFECTION/BREAKDOWN Pt will be free of additional skin breakdown during stay in rehab with Mod I assist  Outcome: Progressing No skin breakdown in addition to areas noted on admission.

## 2014-10-01 ENCOUNTER — Inpatient Hospital Stay (HOSPITAL_COMMUNITY): Payer: Medicare Other | Admitting: Occupational Therapy

## 2014-10-01 ENCOUNTER — Inpatient Hospital Stay (HOSPITAL_COMMUNITY): Payer: Medicare Other | Admitting: Physical Therapy

## 2014-10-01 DIAGNOSIS — I480 Paroxysmal atrial fibrillation: Secondary | ICD-10-CM

## 2014-10-01 DIAGNOSIS — E876 Hypokalemia: Secondary | ICD-10-CM

## 2014-10-01 LAB — RENAL FUNCTION PANEL
Albumin: 2.4 g/dL — ABNORMAL LOW (ref 3.5–5.0)
Anion gap: 13 (ref 5–15)
BUN: 59 mg/dL — ABNORMAL HIGH (ref 6–20)
CO2: 31 mmol/L (ref 22–32)
Calcium: 8.3 mg/dL — ABNORMAL LOW (ref 8.9–10.3)
Chloride: 91 mmol/L — ABNORMAL LOW (ref 101–111)
Creatinine, Ser: 3.84 mg/dL — ABNORMAL HIGH (ref 0.44–1.00)
GFR calc Af Amer: 12 mL/min — ABNORMAL LOW (ref >60)
GFR calc non Af Amer: 10 mL/min — ABNORMAL LOW (ref >60)
Glucose, Bld: 95 mg/dL (ref 65–99)
Phosphorus: 5.3 mg/dL — ABNORMAL HIGH (ref 2.5–4.6)
Potassium: 3.2 mmol/L — ABNORMAL LOW (ref 3.5–5.1)
Sodium: 135 mmol/L (ref 135–145)

## 2014-10-01 MED ORDER — POTASSIUM CHLORIDE CRYS ER 20 MEQ PO TBCR
20.0000 meq | EXTENDED_RELEASE_TABLET | Freq: Two times a day (BID) | ORAL | Status: DC
  Administered 2014-10-01: 20 meq via ORAL
  Filled 2014-10-01: qty 1

## 2014-10-01 MED ORDER — FUROSEMIDE 40 MG PO TABS
40.0000 mg | ORAL_TABLET | Freq: Every day | ORAL | Status: DC
  Administered 2014-10-02 – 2014-10-07 (×6): 40 mg via ORAL
  Filled 2014-10-01 (×6): qty 1

## 2014-10-01 MED ORDER — POTASSIUM CHLORIDE CRYS ER 20 MEQ PO TBCR
40.0000 meq | EXTENDED_RELEASE_TABLET | Freq: Every day | ORAL | Status: DC
  Administered 2014-10-01 – 2014-10-07 (×7): 40 meq via ORAL
  Filled 2014-10-01 (×7): qty 2

## 2014-10-01 NOTE — Progress Notes (Signed)
Assessment/Plan: #1 Nonoliguric acute kidney injury: Likely hemodynamic ACE inhibitor use. Creatinine 3.8, down from 4 yesterday. Decrease Lasix to 40m daily and will give extra 476m of Kdur today with standing dose of 40 mEq. Recheck electrolytes in 48 hours.  Subjective: No complaints this AM. Denies swelling or decreased urine output.  Objective: Vital signs in last 24 hours: Temp:  [97.8 F (36.6 C)-98.2 F (36.8 C)] 97.8 F (36.6 C) (08/20 0555) Pulse Rate:  [77-85] 77 (08/20 0555) Resp:  [17-18] 17 (08/20 0555) BP: (105-106)/(73-85) 106/73 mmHg (08/20 0555) SpO2:  [95 %-98 %] 95 % (08/20 0555) Weight:  [149 lb 4 oz (67.7 kg)] 149 lb 4 oz (67.7 kg) (08/20 0555) Weight change: -7 lb 15.2 oz (-3.606 kg)  Intake/Output from previous day: 08/19 0701 - 08/20 0700 In: 1080 [P.O.:1080] Out: -  Intake/Output this shift: Total I/O In: 240 [P.O.:240] Out: -   General: Elderly Caucasian female, resting in bed, NAD Cardiac: RRR, no rubs, murmurs or gallops Pulm: clear to auscultation bilaterally, no wheezes, rales, or rhonchi Ext: warm and well perfused, no pedal edema Neuro: responds to questions appropriately; moving all extremities freely   BMET:   Recent Labs  09/30/14 0527 10/01/14 0450  NA 136 135  K 3.8 3.2*  CL 94* 91*  CO2 31 31  GLUCOSE 100* 95  BUN 59* 59*  CREATININE 4.12* 3.84*  CALCIUM 8.0* 8.3*    Scheduled: . amiodarone  200 mg Oral Daily  . apixaban  2.5 mg Oral BID  . atorvastatin  10 mg Oral q1800  . carvedilol  6.25 mg Oral BID WC  . feeding supplement (NEPRO CARB STEADY)  237 mL Oral TID WC  . furosemide  40 mg Oral BID  . iron polysaccharides  150 mg Oral Q1200  . nystatin   Topical BID  . pantoprazole  40 mg Oral Daily  . polycarbophil  625 mg Oral BID  . potassium chloride  10 mEq Oral BID  . saccharomyces boulardii  250 mg Oral BID  . triamcinolone cream   Topical BID    LOS: 4 days   PaPosey ProntoRushil 10/01/2014,9:07 AM   Renal  Attending: She is slowly improving and weight improved.  Will reduce diuretics, K supplement and cont support. Rickey Farrier C

## 2014-10-01 NOTE — Progress Notes (Signed)
Occupational Therapy Session Note  Patient Details  Name: Sara Russell MRN: [id number] Date of Birth: [birth year]  Today's Date: 10/01/2014 OT Individual Time: 0930-1030 OT Individual Time Calculation (min): 60 min    Short Term Goals: Week 1:  OT Short Term Goal 1 (Week 1): STG=LTG due to ELOS      Skilled Therapeutic Interventions/Progress Updates:    Pt seen for BADL retraining with a focus on activity tolerance and dynamic balance. Discussed with pt her LTGs of mod I and to focus on doing as many of the tasks, including setting up her supplies. Pt stated she felt more comfortable with RW, pt ambulated around the room, gathered supplies and completed all self care with close S.  Reviewed the easiest way to get into bed, by having top arm rail down and sitting close top of bed. Pt was able to get into bed without A. Pt resting with all needs in place.  Therapy Documentation Precautions:  Precautions Precautions: Fall Precaution Comments: decreased endurance Restrictions Weight Bearing Restrictions: No    Vital Signs: Therapy Vitals Pulse Rate: 82 BP: (!) 111/59 mmHg Pain: Pain Assessment Pain Assessment: No/denies pain ADL:    Function:   Eating Eating               Grooming Oral Care,Brush Teeth, Clean Dentures Activity:      Assist Level: Supervision or verbal cues (standing at sink)      Wash, Rinse, Dry Face Activity   Assist Level: Set up   Set up : To obtain items  Wash, Rinse, Dry Hands Activity          Brush, Comb Hair Activity        Shave Activity Shave activity did not occur: Patient does not shave        Apply Makeup Activity Apply makeup activity did not occur: Patient does not wear makeup                                                           Bathing Bathing position   Position: Shower  Bathing parts Body parts bathed by patient: Right arm;Right upper leg;Left arm;Left upper leg;Abdomen;Chest;Right lower leg;Front perineal  area;Left lower leg;Buttocks;Back    Bathing assist Assist Level: Supervision or verbal cues       Upper Body Dressing/Undressing Upper body dressing   What is the patient wearing?: Pull over shirt/dress;Bra Bra - Perfomed by patient: Thread/unthread right bra strap;Thread/unthread left bra strap;Hook/unhook bra (pull down sports bra)   Pull over shirt/dress - Perfomed by patient: Thread/unthread right sleeve;Thread/unthread left sleeve;Put head through opening;Pull shirt over trunk          Upper body assist Assist Level: More than reasonable time       Lower Body Dressing/Undressing Lower body dressing   What is the patient wearing?: Underwear;Pants;Socks Underwear - Performed by patient: Pull underwear up/down;Thread/unthread right underwear leg;Thread/unthread left underwear leg   Pants- Performed by patient: Thread/unthread right pants leg;Thread/unthread left pants leg;Pull pants up/down;Fasten/unfasten pants   Non-skid slipper socks- Performed by patient: Don/doff right sock;Don/doff left sock                    Lower body assist Assist Level: Set up       Toileting Toileting  Toileting steps completed by patient: Adjust clothing prior to toileting;Performs perineal hygiene;Adjust clothing after toileting   Toileting Assistive Devices: Grab bar or rail;Other (comment)  Toileting assist Assist level: Supervision or verbal cues    Bed Mobility Roll left and right activity      Sit to lying activity   Assist level: Supervision or verbal cues  Lying to sitting activity      Mobility details     Transfers Sit to stand transfer   Sit to stand assist level: Supervision or verbal cues    Chair/bed transfer   Chair/bed transfer method: Ambulatory Chair/bed transfer assist level: Supervision or verbal cues Chair/bed transfer assistive device: Walker   Chair/bed transfer details: Verbal cues for technique  Toilet transfer   Toilet transfer assistive  device: Grab bar;Walker       Assist level to toilet: Supervision or verbal cues Assist level from toilet: Supervision or verbal cues  Tub/shower transfer   Tub/shower assistive device: Engineer, structural   Assist level into tub: Supervision or verbal cues Assist level out of tub: Supervision or verbal cues   Cognition Comprehension Comprehension assist level: Follows complex conversation/direction with no assist  Expression Expression assist level: Expresses complex ideas: With no assist  Social Interaction Social Interaction assist level: Interacts appropriately with others - No medications needed.  Problem Solving Problem solving assist level: Solves basic problems with no assist  Memory Memory assist level: Recognizes or recalls 90% of the time/requires cueing < 10% of the time    Therapy/Group: Individual Therapy  [place] 10/01/2014, 11:03 AM

## 2014-10-01 NOTE — Progress Notes (Signed)
[place] PHYSICAL MEDICINE & REHABILITATION     PROGRESS NOTE    Subjective/Complaints: Noticed low BP but not dizzy, discussed labwork from this am with pt Tolerating therapy program  ROS: Pt denies fever, rash/itching, headache,, nausea, vomiting, abdominal pain, chest pain, shortness of breath, palpitations, dysuria, dizziness,    Objective: Vital Signs: Blood pressure 106/73, pulse 77, temperature 97.8 F (36.6 C), temperature source Oral, resp. rate 17, height 5' 1"  (1.549 m), weight 67.7 kg (149 lb 4 oz), SpO2 95 %. No results found. No results for input(s): WBC, HGB, HCT, PLT in the last 72 hours.  Recent Labs  09/30/14 0527 10/01/14 0450  NA 136 135  K 3.8 3.2*  CL 94* 91*  GLUCOSE 100* 95  BUN 59* 59*  CREATININE 4.12* 3.84*  CALCIUM 8.0* 8.3*   CBG (last 3)  No results for input(s): GLUCAP in the last 72 hours.  Wt Readings from Last 3 Encounters:  10/01/14 67.7 kg (149 lb 4 oz)  09/27/14 71.804 kg (158 lb 4.8 oz)  05/16/14 70.217 kg (154 lb 12.8 oz)    Physical Exam:  Constitutional: She is oriented to person, place, and time.  HENT: oral mucosa pink and moist Head: Normocephalic.  Eyes: EOM are normal.  Neck: Normal range of motion. Neck supple. No thyromegaly present.  Cardiovascular:  Cardiac rate controlled, irregularly irregular  Respiratory: Effort normal and breath sounds normal--occ crackle at bases. No respiratory distress.  GI: Soft. Bowel sounds are normal. She exhibits no distension. There is no tenderness.  Neurological: She is alert and oriented to person, place, and time.  Motor strength is 4/5 bilateral deltoids, biceps, triceps, grip, hip flexor, knee extensor, ankle dorsiflexor and plantar flexor, DTR's 2+ Sensation is intact to light touch in bilateral upper and lower limbs Mood and affect are appropriate There is no evidence of dysarthria, good phonation. Coordination appears normal  Good insight and awareness.   Skin: intact, warm---mild redness/excoriation around right buttock and gluteal folds---improving Psych: pleasant and appropriate  Assessment/Plan: 1. Functional deficits secondary to debility after multiple medical which require 3+ hours per day of interdisciplinary therapy in a comprehensive inpatient rehab setting. Physiatrist is providing close team supervision and 24 hour management of active medical problems listed below. Physiatrist and rehab team continue to assess barriers to discharge/monitor patient progress toward functional and medical goals.  Function:  Bathing Bathing position   Position: Shower  Bathing parts Body parts bathed by patient: Right arm, Right upper leg, Left arm, Left upper leg, Abdomen, Chest, Right lower leg, Front perineal area, Left lower leg, Buttocks, Back    Bathing assist Assist Level: Supervision or verbal cues      Upper Body Dressing/Undressing Upper body dressing   What is the patient wearing?: Pull over shirt/dress Bra - Perfomed by patient: Thread/unthread right bra strap, Thread/unthread left bra strap, Hook/unhook bra (pull down sports bra)   Pull over shirt/dress - Perfomed by patient: Thread/unthread right sleeve, Thread/unthread left sleeve, Put head through opening, Pull shirt over trunk          Upper body assist Assist Level: More than reasonable time      Lower Body Dressing/Undressing Lower body dressing   What is the patient wearing?: Underwear, Pants, Socks, Shoes Underwear - Performed by patient: Pull underwear up/down, Thread/unthread right underwear leg, Thread/unthread left underwear leg Underwear - Performed by helper: Thread/unthread right underwear leg, Thread/unthread left underwear leg (due to putting over shoes PT assisted pt) Pants- Performed by  patient: Thread/unthread right pants leg, Thread/unthread left pants leg, Pull pants up/down, Fasten/unfasten pants Pants- Performed by helper: Thread/unthread right  pants leg Non-skid slipper socks- Performed by patient: Don/doff right sock, Don/doff left sock   Socks - Performed by patient: Don/doff right sock, Don/doff left sock   Shoes - Performed by patient: Don/doff left shoe, Don/doff right shoe            Lower body assist Assist Level: Set up   Set up : To obtain clothing/put away  Toileting Toileting   Toileting steps completed by patient: Adjust clothing prior to toileting, Performs perineal hygiene, Adjust clothing after toileting   Toileting Assistive Devices: Grab bar or rail, Other (comment)  Toileting assist Assist level: Touching or steadying assistance (Pt.75%)   Transfers Chair/bed transfer   Chair/bed transfer method: Ambulatory Chair/bed transfer assist level: Touching or steadying assistance (Pt > 75%) Chair/bed transfer assistive device: Armrests, Careers adviser device: Walker-rolling Max distance: 150 Assist level: Supervision or verbal cues   Wheelchair   Type: Manual Max wheelchair distance: 100 Assist Level: Dependent (Pt equals 0%)  Cognition Comprehension Comprehension assist level: Follows complex conversation/direction with no assist  Expression Expression assist level: Expresses complex ideas: With no assist  Social Interaction Social Interaction assist level: Interacts appropriately with others - No medications needed.  Problem Solving Problem solving assist level: Solves basic problems with no assist  Memory Memory assist level: Recognizes or recalls 90% of the time/requires cueing < 10% of the time   Medical Problem List and Plan: 1. Functional deficits secondary to debilitation related to sepsis with acute renal failure 2. DVT Prophylaxis/Anticoagulation: SCDs. ambulation 3. Pain Management: under control 4. Acute renal failure secondary to ischemic ATN.  -labs showing slow improvement  -lasix diuresis per nephrology 5. Neuropsych: This patient is capable of  making decisions on her own behalf. 6. Skin/Wound Care:  Fungal powder, oob, pressure relief to buttocks 7. Fluids/Electrolytes/Nutrition: encourage PO, 50-100% meals, ~1L fluid 8. ID/sepsis/liver mass. All antibiotics currently discontinued  9. Hypertension/atrial fibrillation. Continue amiodarone 200 mg daily, Coreg 3.125 mg twice a day.  -resume eliquis at some point 10. Systolic congestive heart failure: no signs of fluid overload except some ankle edema, pretib. Weight 71kg  -lasix 11. Hyperlipidemia. Lipitor 12. Loose stool: fiber and probiotic added  -encourage po 13.  HypoK likely lasix add l KCL 48mq BID recheck BMET in am , discussed with Dr PSteward Ros .   LOS (Days) 4 A FACE TO FACE EVALUATION WAS PERFORMED  Terrel Manalo E 10/01/2014 9:10 AM

## 2014-10-01 NOTE — Progress Notes (Addendum)
Physical Therapy Session Note  Patient Details  Name: Sara Russell MRN: [id number] Date of Birth: 1930/12/01  Today's Date: 10/01/2014 PT Individual Time: 1300-1350 PT Individual Time Calculation (min): 50 min   Short Term Goals: Week 1:  PT Short Term Goal 1 (Week 1): =LTG's due to ELOS  Skilled Therapeutic Interventions/Progress Updates:    Pt received in bed - agreeable to PT session. Therapeutic Activity - see below, toilet transfer. Gait Training - see below - for stairs, pt alternates using B hands on one rail (6" high steps) and one hand on each (B) rail (3" height), using corner stairs in step-to pattern (6" height) and step over step pattern (3" height). Pt trials ambulation with SPC and does so x 10' with CGA and trials ambulation with HHA req min A x 5'. Pt very apprehensive about falling. Neuromuscular Reeducation - PT reviews [place] Level A exercises with pt and she demonstrates understanding. Continue per PT POC.   Therapy Documentation Precautions:  Precautions Precautions: Fall Precaution Comments: decreased endurance Restrictions Weight Bearing Restrictions: No  Pain: Pain Assessment Pain Assessment: No/denies pain   Function:  Herbalist Devices: Grab bar or rail   Bed Mobility Roll left and right activity      Sit to lying activity   Assist level: Supervision or verbal cues  Lying to sitting activity   Assist level: Supervision or verbal cues  Mobility details Bed mobility details: Verbal cues for techniques   Transfers Sit to stand transfer   Sit to stand assist level: Supervision or verbal cues Sit to stand assistive device: Walker  Chair/bed transfer   Chair/bed transfer method: Ambulatory Chair/bed transfer assist level: Supervision or verbal cues Chair/bed transfer assistive device: Walker   Chair/bed transfer details: Verbal cues for Animal nutritionist transfer assistive device: Soil scientist     Max distance: 150 Assist level: Supervision or verbal cues  Walk 10 feet activity   Assist level: Supervision or verbal cues  Walk 50 feet with 2 turns activity   Assist level: Supervision or verbal cues  Walk 150 feet activity   Assist level: Supervision or verbal cues  Walk 10 feet on uneven surfaces activity      Stairs   Stairs assistive device: 1 hand rail;2 hand rails Max number of stairs: 12 Stairs assist level: Supervision or verbal cues  Walk up/down 1 step activity     Walk up/down 1 step (curb) assist level: Supervision or verbal cues  Walk up/down 4 steps activity   Walk up/down 4 steps assist level: Supervision or verbal cues  Walk up/down 12 steps activity   Walk up/down 12 steps assist level: Supervision or verbal cues  Pick up small objects from floor      Wheelchair          Wheel 50 feet with 2 turns activity      Wheel 150 feet activity       Cognition Comprehension Comprehension assist level: Follows complex conversation/direction with no assist  Expression Expression assist level: Expresses complex ideas: With no assist  Social Interaction Social Interaction assist level: Interacts appropriately with others - No medications needed.  Problem Solving Problem solving assist level: Solves basic problems with no assist  Memory Memory assist level: Recognizes or recalls 90% of the time/requires cueing < 10% of the time  Therapy/Group: Individual Therapy  Sara Russell M 10/01/2014, 1:34 PM

## 2014-10-02 ENCOUNTER — Inpatient Hospital Stay (HOSPITAL_COMMUNITY): Payer: Medicare Other | Admitting: Occupational Therapy

## 2014-10-02 ENCOUNTER — Inpatient Hospital Stay (HOSPITAL_COMMUNITY): Payer: Medicare Other

## 2014-10-02 DIAGNOSIS — N170 Acute kidney failure with tubular necrosis: Secondary | ICD-10-CM

## 2014-10-02 NOTE — Progress Notes (Signed)
Occupational Therapy Session Note  Patient Details  Name: Sara Russell MRN: [id number] Date of Birth: 09/15/1930  Today's Date: 10/02/2014 OT Individual Time: 1000-1100 and 1345-1430 OT Individual Time Calculation (min): 60 min and 45 min   Short Term Goals: Week 1:  OT Short Term Goal 1 (Week 1): STG=LTG due to ELOS  Skilled Therapeutic Interventions/Progress Updates:    Pt seen for OT ADL session. Pt in supine upon arrival, denying pain, however voicing fatigue from previous therapy session. She declined full bathing task for energy conservation, opting only to dress. Pt ambulated throughout room with RW to gather items from closet and drawers. VCs provided for RW management during functional task. Pt dressed EOB, completing sit <> stands at [facility] with supervision. She completed grooming tasks in standing at the sink in order to increase functional activity tolerance. Following seated rest break, pt ambulated to ADL apartment using RW and supervision. She took seated rest break on low soft surface couch. Pt required VCs and demonstrational cuing for sit >stand technique from low surface.    In therapy gym, pt completed simulated laundry task simulating front loader washer/dryer that she has at home. Pt obtained items from high self and placed/ removed items from low area, completing standing at [facility] with supervision. She then completed 4 min on NuStep at level 3, requiring rest breaks throughout due to decreased activity tolerance. . Education provided regarding energy conservation and pacing self throughout tasks. Pt returned to room at end of session, requesting return to bed. Pt left in supine with all needs in reach.  Education provided regarding energy conservation and d/c planning.   Session Two: Pt seen for OT therapy session focusing on UE strengthening and functional activity tolerance. Pt sitting EOB upon arrival, having finished PT session 15 minutes prior. Pt voiced fatigue, however,  agreeable to tx session. She ambulated down hallway using RW with supervision. Following ~21f of functional ambulation, pt voiced increased fatigue and refusal to cont with therapy session in gym, requesting return to bed. Pt educated regarding benefits of OOB and importance of mobility. She was agreeable to return to room to complete UE strengthening exercises. Pt sat EOB to complete UE strengthening exercises using level I theraband and #2 dowel. Exercises completed to all major UE muscle groups with verbal and demonstrational cues provided for proper form and technique. Pt required VCs throughout to pace self through exercises as she would complete exercises quickly, fatiguing quickly and performing with improper form. Pt completed standing bent over rows with steadying assist for UE and LE strengthening. All exercises completed x10 reps. Following all exercises, pt was asked to teach back exercises to demonstrate understanding of HEP. Pt required max VCs to correctly recall exercises.  Some exercises written out on paper for better carry over of learning. Pt requested return to bed at end of session, left in supine with all needs in reach.   Therapy Documentation Precautions:  Precautions Precautions: Fall Precaution Comments: decreased endurance Restrictions Weight Bearing Restrictions: No Pain: Pain Assessment Pain Assessment: No/denies pain  Function:   Eating Eating   Eating Assist Level: No help, No cues           Grooming Oral Care,Brush Teeth, Clean Dentures Activity:      Assist Level: Supervision or verbal cues (Standing at sink)      Wash, Rinse, Dry Face Activity          Wash, Rinse, Dry Hands Activity  Brush, Comb Hair Activity   Assist Level: Supervision or verbal cues (In standing)    Shave Activity          Apply Makeup Activity                                                               Upper Body Dressing/Undressing Upper body  dressing   What is the patient wearing?: Pull over shirt/dress;Bra Bra - Perfomed by patient: Thread/unthread right bra strap;Thread/unthread left bra strap;Hook/unhook bra (pull down sports bra)   Pull over shirt/dress - Perfomed by patient: Thread/unthread right sleeve;Thread/unthread left sleeve;Put head through opening;Pull shirt over trunk          Upper body assist Assist Level: More than reasonable time       Lower Body Dressing/Undressing Lower body dressing   What is the patient wearing?: Underwear;Pants Underwear - Performed by patient: Pull underwear up/down;Thread/unthread right underwear leg;Thread/unthread left underwear leg   Pants- Performed by patient: Thread/unthread right pants leg;Thread/unthread left pants leg;Pull pants up/down;Fasten/unfasten pants       Socks - Performed by patient: Don/doff right sock;Don/doff left sock   Shoes - Performed by patient: Don/doff left shoe;Don/doff right shoe            Lower body assist Assist Level: Supervision or verbal cues        Bed Mobility Roll left and right activity   Assist level: Supervision or verbal cues  Sit to lying activity      Lying to sitting activity   Assist level: Supervision or verbal cues  Mobility details Bed mobility details: Verbal cues for techniques   Transfers Sit to stand transfer   Sit to stand assist level: Supervision or verbal cues Sit to stand assistive device: Armrests;Walker  Chair/bed transfer   Chair/bed transfer method: Ambulatory Chair/bed transfer assist level: Supervision or verbal cues Chair/bed transfer assistive device: Walker   Chair/bed transfer details: Verbal cues for technique;Verbal cues for sequencing  Toilet transfer                Tub/shower transfer                Therapy/Group: Individual Therapy  Lewis, Joyleen Haselton C 10/02/2014, 10:21 AM

## 2014-10-02 NOTE — Progress Notes (Signed)
[place] PHYSICAL MEDICINE & REHABILITATION     PROGRESS NOTE    Subjective/Complaints: Pt without new issues, amb short distance without assistive dev yest Discussed KCL and labwork  ROS: Pt denies fever, rash/itching, headache,, nausea, vomiting, abdominal pain, chest pain, shortness of breath, palpitations, dysuria, dizziness,    Objective: Vital Signs: Blood pressure 119/64, pulse 77, temperature 98 F (36.7 C), temperature source Oral, resp. rate 18, height 5' 1"  (1.549 m), weight 68.13 kg (150 lb 3.2 oz), SpO2 94 %. No results found. No results for input(s): WBC, HGB, HCT, PLT in the last 72 hours.  Recent Labs  09/30/14 0527 10/01/14 0450  NA 136 135  K 3.8 3.2*  CL 94* 91*  GLUCOSE 100* 95  BUN 59* 59*  CREATININE 4.12* 3.84*  CALCIUM 8.0* 8.3*   CBG (last 3)  No results for input(s): GLUCAP in the last 72 hours.  Wt Readings from Last 3 Encounters:  10/02/14 68.13 kg (150 lb 3.2 oz)  09/27/14 71.804 kg (158 lb 4.8 oz)  05/16/14 70.217 kg (154 lb 12.8 oz)    Physical Exam:  Constitutional: She is oriented to person, place, and time.  HENT: oral mucosa pink and moist Head: Normocephalic.  Eyes: EOM are normal.  Neck: Normal range of motion. Neck supple. No thyromegaly present.  Cardiovascular:  Cardiac rate controlled, irregularly irregular  Respiratory: Effort normal and breath sounds normal--occ crackle at bases. No respiratory distress.  GI: Soft. Bowel sounds are normal. She exhibits no distension. There is no tenderness.  Neurological: She is alert and oriented to person, place, and time.  Motor strength is 4/5 bilateral deltoids, biceps, triceps, grip, hip flexor, knee extensor, ankle dorsiflexor and plantar flexor, DTR's 2+ Sensation is intact to light touch in bilateral upper and lower limbs Mood and affect are appropriate There is no evidence of dysarthria, good phonation. Coordination appears normal  Good insight and awareness.   Skin: intact, warm---mild redness/excoriation around right buttock and gluteal folds---improving Psych: pleasant and appropriate  Assessment/Plan: 1. Functional deficits secondary to debility after multiple medical which require 3+ hours per day of interdisciplinary therapy in a comprehensive inpatient rehab setting. Physiatrist is providing close team supervision and 24 hour management of active medical problems listed below. Physiatrist and rehab team continue to assess barriers to discharge/monitor patient progress toward functional and medical goals.  Function:  Bathing Bathing position   Position: Shower  Bathing parts Body parts bathed by patient: Right arm, Right upper leg, Left arm, Left upper leg, Abdomen, Chest, Right lower leg, Front perineal area, Left lower leg, Buttocks, Back    Bathing assist Assist Level: Supervision or verbal cues      Upper Body Dressing/Undressing Upper body dressing   What is the patient wearing?: Pull over shirt/dress, Bra Bra - Perfomed by patient: Thread/unthread right bra strap, Thread/unthread left bra strap, Hook/unhook bra (pull down sports bra)   Pull over shirt/dress - Perfomed by patient: Thread/unthread right sleeve, Thread/unthread left sleeve, Put head through opening, Pull shirt over trunk          Upper body assist Assist Level: More than reasonable time      Lower Body Dressing/Undressing Lower body dressing   What is the patient wearing?: Underwear, Pants, Socks Underwear - Performed by patient: Pull underwear up/down, Thread/unthread right underwear leg, Thread/unthread left underwear leg Underwear - Performed by helper: Thread/unthread right underwear leg, Thread/unthread left underwear leg (due to putting over shoes PT assisted pt) Pants- Performed by patient:  Thread/unthread right pants leg, Thread/unthread left pants leg, Pull pants up/down, Fasten/unfasten pants Pants- Performed by helper: Thread/unthread right pants  leg Non-skid slipper socks- Performed by patient: Don/doff right sock, Don/doff left sock   Socks - Performed by patient: Don/doff right sock, Don/doff left sock   Shoes - Performed by patient: Don/doff left shoe, Don/doff right shoe            Lower body assist Assist Level: Set up   Set up : To obtain clothing/put away  Toileting Toileting   Toileting steps completed by patient: Adjust clothing prior to toileting, Adjust clothing after toileting Toileting steps completed by helper: Performs perineal hygiene (pt has a hemmorhoid,assisted to get pt clean) Toileting Assistive Devices: Grab bar or rail  Toileting assist Assist level: Touching or steadying assistance (Pt.75%)   Transfers Chair/bed transfer   Chair/bed transfer method: Ambulatory Chair/bed transfer assist level: Supervision or verbal cues Chair/bed transfer assistive device: Careers adviser device: Walker-rolling Max distance: 150 Assist level: Supervision or verbal cues   Wheelchair   Type: Manual Max wheelchair distance: 100 Assist Level: Dependent (Pt equals 0%)  Cognition Comprehension Comprehension assist level: Follows complex conversation/direction with no assist  Expression Expression assist level: Expresses complex ideas: With no assist  Social Interaction Social Interaction assist level: Interacts appropriately with others - No medications needed.  Problem Solving Problem solving assist level: Solves complex problems: Recognizes & self-corrects  Memory Memory assist level: Recognizes or recalls 90% of the time/requires cueing < 10% of the time   Medical Problem List and Plan: 1. Functional deficits secondary to debilitation related to sepsis with acute renal failure, still max A ambulation per PT notes 2. DVT Prophylaxis/Anticoagulation: SCDs. ambulation 3. Pain Management: under control 4. Acute renal failure secondary to ischemic ATN.  -labs showing slow  improvement  -lasix diuresis per nephrology 5. Neuropsych: This patient is capable of making decisions on her own behalf. 6. Skin/Wound Care:  Fungal powder, oob, pressure relief to buttocks 7. Fluids/Electrolytes/Nutrition: encourage PO, 50-100% meals, ~648m fluid 8. ID/sepsis/liver mass. All antibiotics currently discontinued  9. Hypertension/atrial fibrillation. Continue amiodarone 200 mg daily, Coreg 3.125 mg twice a day.BP 119/64 this am  -resume eliquis at some point 10. Systolic congestive heart failure: no signs of fluid overload except some ankle edema, pretib. Weight 71kg  -lasix 11. Hyperlipidemia. Lipitor 12. Loose stool: fiber and probiotic added  -encourage po 13.  HypoK likely lasix add l KCL 243m BID recheck BMET in am , discussed with Dr PoSteward Rospt c/o size of KCL pills  .   LOS (Days) 5 A FACE TO FACE EVALUATION WAS PERFORMED  Sara Russell E 10/02/2014 9:03 AM

## 2014-10-02 NOTE — Progress Notes (Signed)
Physical Therapy Session Note  Patient Details  Name: Sara Russell MRN: [id number] Date of Birth: 1930/05/04  Today's Date: 10/02/2014 PT Individual Time: 1300-1330 PT Individual Time Calculation (min): 30 min   Short Term Goals: Week 1:  PT Short Term Goal 1 (Week 1): =LTG's due to ELOS  Skilled Therapeutic Interventions/Progress Updates:   Session focused on functional gait on unit for overall endurance and strengthening with RW x 150' x 2, dynamic gait through obstacle course to simulate household and community mobility at S level with RW practicing R and L sidestepping and stepping over simulated threshold with cues for safety, stair negotiation training with S/steady A with cues for technique and steady A needed on taller steps, and balance training for gait without AD to go around obstacles with overall min A needed and slow cadence noted. End of session left EOB with all needs in reach.  Therapy Documentation Precautions:  Precautions Precautions: Fall Precaution Comments: decreased endurance Restrictions Weight Bearing Restrictions: No  Pain: Reports mild R knee pain - topical treatment was applied.  Function:  Toileting Toileting       Bed Mobility Roll left and right activity      Sit to lying activity      Lying to sitting activity   Assist level: Supervision or verbal cues  Mobility details     Transfers Sit to stand transfer   Sit to stand assist level: Supervision or verbal cues    Chair/bed transfer   Chair/bed transfer method: Ambulatory Chair/bed transfer assist level: Supervision or verbal cues Chair/bed transfer assistive device: Estate manager/land agent          Walk 10 feet activity      Walk 50 feet with 2 turns activity      Walk 150 feet activity      Walk 10 feet on uneven surfaces activity      Stairs   Stairs assistive device: 1 hand rail;2 hand rails Max number  of stairs: 12 Stairs assist level: Touching or steadying assistance (Pt > 75%)  Walk up/down 1 step activity     Walk up/down 1 step (curb) assist level: Supervision or verbal cues  Walk up/down 4 steps activity   Walk up/down 4 steps assist level: Touching or steadying assistance (Pt > 75%)  Walk up/down 12 steps activity   Walk up/down 12 steps assist level: Touching or steadying assistance (Pt > 75%)  Pick up small objects from floor      Wheelchair Wheelchair activity did not occur:  (gait to/from therapy)        Wheel 50 feet with 2 turns activity      Wheel 150 feet activity         Therapy/Group: Individual Therapy  Canary Brim Ivory Broad, PT, DPT  10/02/2014, 1:45 PM

## 2014-10-02 NOTE — Progress Notes (Signed)
Physical Therapy Session Note  Patient Details  Name: Sara Russell MRN: [id number] Date of Birth: 10/02/1930  Today's Date: 10/02/2014 PT Individual Time: 0900-1000 PT Individual Time Calculation (min): 60 min   Short Term Goals: Week 1:  PT Short Term Goal 1 (Week 1): =LTG's due to ELOS  Skilled Therapeutic Interventions/Progress Updates:   Session focused on functional transfers (see below), gait training with RW for endurance and strengthening and functional mobility training at S level (see below for distance), gait without AD for challenge to balance with min A x 25' with facilitation for weighshifting as pt with very rigid movement pattern and short step length, neuro re-ed for balance retraining on compliant surface while performing various reaching activities to simulate functional task without UE support with overall min A, and reviewed [place] HEP for functional strengthening and balance x 10 reps each x 2 sets (standing hip abduction, hamstring curls, mini squats, LAQ, and heel/toe raises).   Therapy Documentation Precautions:  Precautions Precautions: Fall Precaution Comments: decreased endurance Restrictions Weight Bearing Restrictions: No  Pain:  Denies pain.    Function:  Toileting Toileting       Bed Mobility Roll left and right activity   Assist level: Supervision or verbal cues  Sit to lying activity      Lying to sitting activity   Assist level: Supervision or verbal cues  Mobility details Bed mobility details: Verbal cues for techniques   Transfers Sit to stand transfer   Sit to stand assist level: Supervision or verbal cues Sit to stand assistive device: Armrests;Walker  Chair/bed transfer   Chair/bed transfer method: Ambulatory Chair/bed transfer assist level: Supervision or verbal cues Chair/bed transfer assistive device: Walker   Chair/bed transfer details: Verbal cues for technique;Verbal cues for Scientific laboratory technician     Max distance: 150 Assist level: Supervision or verbal cues  Walk 10 feet activity   Assist level: Supervision or verbal cues  Walk 50 feet with 2 turns activity   Assist level: Supervision or verbal cues  Walk 150 feet activity   Assist level: Supervision or verbal cues                                      Therapy/Group: Individual Therapy  Canary Brim Ivory Broad, PT, DPT  10/02/2014, 9:42 AM

## 2014-10-03 ENCOUNTER — Inpatient Hospital Stay (HOSPITAL_COMMUNITY): Payer: Medicare Other | Admitting: Occupational Therapy

## 2014-10-03 ENCOUNTER — Inpatient Hospital Stay (HOSPITAL_COMMUNITY): Payer: Medicare Other | Admitting: Physical Therapy

## 2014-10-03 LAB — RENAL FUNCTION PANEL
Albumin: 2.8 g/dL — ABNORMAL LOW (ref 3.5–5.0)
Anion gap: 12 (ref 5–15)
BUN: 61 mg/dL — ABNORMAL HIGH (ref 6–20)
CO2: 29 mmol/L (ref 22–32)
Calcium: 9.1 mg/dL (ref 8.9–10.3)
Chloride: 96 mmol/L — ABNORMAL LOW (ref 101–111)
Creatinine, Ser: 3.86 mg/dL — ABNORMAL HIGH (ref 0.44–1.00)
GFR calc Af Amer: 12 mL/min — ABNORMAL LOW (ref >60)
GFR calc non Af Amer: 10 mL/min — ABNORMAL LOW (ref >60)
Glucose, Bld: 119 mg/dL — ABNORMAL HIGH (ref 65–99)
Phosphorus: 4.2 mg/dL (ref 2.5–4.6)
Potassium: 4.2 mmol/L (ref 3.5–5.1)
Sodium: 137 mmol/L (ref 135–145)

## 2014-10-03 MED ORDER — TUBERCULIN PPD 5 UNIT/0.1ML ID SOLN
5.0000 [IU] | Freq: Once | INTRADERMAL | Status: AC
  Administered 2014-10-03: 5 [IU] via INTRADERMAL
  Filled 2014-10-03: qty 0.1

## 2014-10-03 NOTE — Progress Notes (Signed)
Physical Therapy Session Note  Patient Details  Name: Sara Russell MRN: [id number] Date of Birth: 09/15/1930  Today's Date: 10/03/2014 PT Individual Time: 1110-1205 PT Individual Time Calculation (min): 55 min   Short Term Goals: Week 1:  PT Short Term Goal 1 (Week 1): =LTG's due to ELOS  Skilled Therapeutic Interventions/Progress Updates:   Pt received in recliner; no c/o pain.  Performed ambulation with RW in controlled environment as below; performed car transfer training with RW as below.  Pt also reviewed [place] HEP in sitting and standing with UE support on counter with supervision.  Continued gait training without AD in controlled environment x 15' x 2 reps with light L HHA (min A) with pt demonstrating increased arm swing and gait speed with 2nd repetition.  Returned to room in w/c and transferred to EOB to eat lunch.  Therapy Documentation Precautions:  Precautions Precautions: Fall Precaution Comments: decreased endurance Restrictions Weight Bearing Restrictions: No Vital Signs: Therapy Vitals Pulse Rate: 78 BP: 125/77 mmHg Pain:  No pain  Function:   Transfers Sit to stand transfer   Sit to stand assist level: Supervision or verbal cues Sit to stand assistive device: Walker  Chair/bed transfer   Chair/bed transfer method: Ambulatory Chair/bed transfer assist level: Supervision or verbal cues Chair/bed transfer assistive device: Walker   Chair/bed transfer details: Verbal cues for Naval architect transfer assistive device: Publishing rights manager transfer assist level: Supervision or verbal cues    Locomotion Ambulation     Max distance: 150 with RW supervision; with L HHA min A x 15' x 2 Assist level: Supervision or verbal cues  Walk 10 feet activity   Assist level: Supervision or verbal cues  Walk 50 feet with 2 turns activity   Assist level: Supervision or verbal cues  Walk 150 feet activity   Assist level: Supervision or verbal cues   Cognition Comprehension Comprehension assist level: Follows complex conversation/direction with no assist  Expression Expression assist level: Expresses complex ideas: With no assist  Social Interaction Social Interaction assist level: Interacts appropriately with others - No medications needed.  Problem Solving Problem solving assist level: Solves complex problems: Recognizes & self-corrects  Memory Memory assist level: More than reasonable amount of time    Therapy/Group: Individual Therapy  Raylene Everts [facility] 10/03/2014, 12:54 PM

## 2014-10-03 NOTE — Progress Notes (Signed)
Occupational Therapy Session Note  Patient Details  Name: Sara Russell MRN: [id number] Date of Birth: [birth year]  Today's Date: 10/03/2014 OT Individual Time: 3710-6269 OT Individual Time Calculation (min): 75 min    Short Term Goals: Week 1:  OT Short Term Goal 1 (Week 1): STG=LTG due to ELOS  Skilled Therapeutic Interventions/Progress Updates:    Pt seen for OT ADL bathing and dressing session. Pt in supine upon arrival, agreeable to tx session. She ambulated throughout room using RW and supervision with VCs for RW management during functional tasks, often leaving walker to the side when accessing drawers. She demonstrated good functional balance, able to bend down to obtain items from low drawer with supervision. See below for needed assist for functional transfers and bathing/ dressing tasks.  She ate breakfast seated unsupported EOB for core strength/ stability.  Pt ambulated to ADL apartment using RW with supervision. She completed simulated step in shower transfer using RW with steadying assist as no grab bars available. Pt reports having grab bars in shower at d/c location. Safety recommendations provided. Pt returned to room at end of session, left sitting in recliner with all needs in reach.  Pt educated regarding energy conservation, home safety modifications, and d/c planning.   Therapy Documentation Precautions:  Precautions Precautions: Fall Precaution Comments: decreased endurance Restrictions Weight Bearing Restrictions: No Pain: Pain Assessment Pain Assessment: No/denies pain  Function:   Eating Eating   Eating Assist Level: No help, No cues           Grooming Oral Care,Brush Teeth, Clean Dentures Activity:             Wash, Rinse, Dry Face Activity          Wash, Rinse, Dry Hands Activity          Brush, Comb Hair Activity   Assist Level: More than reasonable time    Shave Activity          Apply Makeup Activity                                                              Bathing Bathing position   Position: Shower  Bathing parts Body parts bathed by patient: Right arm;Right upper leg;Left arm;Left upper leg;Abdomen;Chest;Right lower leg;Front perineal area;Left lower leg;Buttocks;Back    Bathing assist Assist Level: Supervision or verbal cues       Upper Body Dressing/Undressing Upper body dressing   What is the patient wearing?: Pull over shirt/dress;Bra Bra - Perfomed by patient: Thread/unthread right bra strap;Thread/unthread left bra strap;Hook/unhook bra (pull down sports bra)   Pull over shirt/dress - Perfomed by patient: Thread/unthread right sleeve;Thread/unthread left sleeve;Put head through opening;Pull shirt over trunk          Upper body assist Assist Level: More than reasonable time       Lower Body Dressing/Undressing Lower body dressing   What is the patient wearing?: Underwear;Pants;Socks;Shoes Underwear - Performed by patient: Pull underwear up/down;Thread/unthread right underwear leg;Thread/unthread left underwear leg   Pants- Performed by patient: Thread/unthread right pants leg;Thread/unthread left pants leg;Pull pants up/down;Fasten/unfasten pants       Socks - Performed by patient: Don/doff right sock;Don/doff left sock   Shoes - Performed by patient: Don/doff left shoe;Don/doff right shoe  Lower body assist Assist Level: Supervision or verbal cues        Bed Mobility Roll left and right activity      Sit to lying activity      Lying to sitting activity   Assist level: Supervision or verbal cues  Mobility details     Transfers Sit to stand transfer   Sit to stand assist level: Supervision or verbal cues Sit to stand assistive device: Walker  Chair/bed transfer   Chair/bed transfer method: Ambulatory Chair/bed transfer assist level: Supervision or verbal cues Chair/bed transfer assistive device: Automotive engineer assistive device: Teaching laboratory technician level into tub: Supervision or verbal cues Assist level out of tub: Supervision or verbal cues     Therapy/Group: Individual Therapy  Lewis, Azora Bonzo C 10/03/2014, 8:22 AM

## 2014-10-03 NOTE — Progress Notes (Signed)
[place] PHYSICAL MEDICINE & REHABILITATION     PROGRESS NOTE    Subjective/Complaints: Was fatigued from therapy yesterday. No new complaints. Pleased with progress/staff    ROS: Pt denies fever, rash/itching, headache,, nausea, vomiting, abdominal pain, chest pain, shortness of breath, palpitations, dysuria, dizziness,    Objective: Vital Signs: Blood pressure 118/75, pulse 82, temperature 97.6 F (36.4 C), temperature source Oral, resp. rate 18, height 5' 1"  (1.549 m), weight 69.582 kg (153 lb 6.4 oz), SpO2 97 %. No results found. No results for input(s): WBC, HGB, HCT, PLT in the last 72 hours.  Recent Labs  10/01/14 0450  NA 135  K 3.2*  CL 91*  GLUCOSE 95  BUN 59*  CREATININE 3.84*  CALCIUM 8.3*   CBG (last 3)  No results for input(s): GLUCAP in the last 72 hours.  Wt Readings from Last 3 Encounters:  10/03/14 69.582 kg (153 lb 6.4 oz)  09/27/14 71.804 kg (158 lb 4.8 oz)  05/16/14 70.217 kg (154 lb 12.8 oz)    Physical Exam:  Constitutional: She is oriented to person, place, and time.  HENT: oral mucosa pink and moist Head: Normocephalic.  Eyes: EOM are normal.  Neck: Normal range of motion. Neck supple. No thyromegaly present.  Cardiovascular:  Cardiac rate controlled, irregularly irregular  Respiratory: Effort normal and breath sounds normal--occ crackle at bases. No respiratory distress.  GI: Soft. Bowel sounds are normal. She exhibits no distension. There is no tenderness.  Neurological: She is alert and oriented to person, place, and time.  Motor strength is 4/5 bilateral deltoids, biceps, triceps, grip, hip flexor, knee extensor, ankle dorsiflexor and plantar flexor, DTR's 2+ Sensation is intact to light touch in bilateral upper and lower limbs Mood and affect are appropriate There is no evidence of dysarthria, good phonation. Coordination appears normal  Good insight and awareness.  Skin: intact, warm---mild redness/excoriation around  right buttock and gluteal folds---improving Psych: pleasant and appropriate  Assessment/Plan: 1. Functional deficits secondary to debility after multiple medical which require 3+ hours per day of interdisciplinary therapy in a comprehensive inpatient rehab setting. Physiatrist is providing close team supervision and 24 hour management of active medical problems listed below. Physiatrist and rehab team continue to assess barriers to discharge/monitor patient progress toward functional and medical goals.  Function:  Bathing Bathing position   Position: Shower  Bathing parts Body parts bathed by patient: Right arm, Right upper leg, Left arm, Left upper leg, Abdomen, Chest, Right lower leg, Front perineal area, Left lower leg, Buttocks, Back    Bathing assist Assist Level: Supervision or verbal cues      Upper Body Dressing/Undressing Upper body dressing   What is the patient wearing?: Pull over shirt/dress, Bra Bra - Perfomed by patient: Thread/unthread right bra strap, Thread/unthread left bra strap, Hook/unhook bra (pull down sports bra)   Pull over shirt/dress - Perfomed by patient: Thread/unthread right sleeve, Thread/unthread left sleeve, Put head through opening, Pull shirt over trunk          Upper body assist Assist Level: More than reasonable time      Lower Body Dressing/Undressing Lower body dressing   What is the patient wearing?: Underwear, Pants, Socks, Shoes Underwear - Performed by patient: Pull underwear up/down, Thread/unthread right underwear leg, Thread/unthread left underwear leg Underwear - Performed by helper: Thread/unthread right underwear leg, Thread/unthread left underwear leg (due to putting over shoes PT assisted pt) Pants- Performed by patient: Thread/unthread right pants leg, Thread/unthread left pants leg, Pull pants  up/down, Fasten/unfasten pants Pants- Performed by helper: Thread/unthread right pants leg Non-skid slipper socks- Performed by  patient: Don/doff right sock, Don/doff left sock   Socks - Performed by patient: Don/doff right sock, Don/doff left sock   Shoes - Performed by patient: Don/doff left shoe, Don/doff right shoe            Lower body assist Assist Level: Supervision or verbal cues   Set up : To obtain clothing/put away  Toileting Toileting   Toileting steps completed by patient: Adjust clothing prior to toileting, Performs perineal hygiene, Adjust clothing after toileting Toileting steps completed by helper: Performs perineal hygiene (pt has a hemmorhoid,assisted to get pt clean) Toileting Assistive Devices: Grab bar or rail  Toileting assist Assist level: Supervision or verbal cues   Transfers Chair/bed transfer   Chair/bed transfer method: Ambulatory Chair/bed transfer assist level: Supervision or verbal cues Chair/bed transfer assistive device: Careers adviser device: Walker-rolling Max distance: 150 Assist level: Supervision or Psychologist, clinical activity did not occur:  (gait to/from therapy) Type: Manual Max wheelchair distance: 100 Assist Level: Dependent (Pt equals 0%)  Cognition Comprehension Comprehension assist level: Follows complex conversation/direction with no assist  Expression Expression assist level: Expresses complex ideas: With no assist  Social Interaction Social Interaction assist level: Interacts appropriately with others - No medications needed.  Problem Solving Problem solving assist level: Solves complex problems: Recognizes & self-corrects  Memory Memory assist level: More than reasonable amount of time   Medical Problem List and Plan: 1. Functional deficits secondary to debilitation related to sepsis with acute renal failure, still max A ambulation per PT notes 2. DVT Prophylaxis/Anticoagulation: SCDs. ambulation 3. Pain Management: under control 4. Acute renal failure secondary to ischemic ATN.  -labs showing  slow gradual improvement  -lasix diuresis per nephrology  -making urine 5. Neuropsych: This patient is capable of making decisions on her own behalf. 6. Skin/Wound Care:  Fungal powder, oob, pressure relief to buttocks 7. Fluids/Electrolytes/Nutrition: encourage PO, 50-100% meals, ~687m fluid 8. ID/sepsis/liver mass. All antibiotics currently discontinued  9. Hypertension/atrial fibrillation. Continue amiodarone 200 mg daily, Coreg 3.125 mg twice a day.BP 119/64 this am  -resume eliquis at some point 10. Systolic congestive heart failure: no signs of fluid overload except some ankle edema, pretib. Weight 71kg  -lasix 11. Hyperlipidemia. Lipitor 12. Loose stool: fiber and probiotic added  -encourage po 13.  Hypokalemia---K+ supplement  -  .   LOS (Days) 6 A FACE TO FACE EVALUATION WAS PERFORMED  Sara Russell T 10/03/2014 8:59 AM

## 2014-10-03 NOTE — Progress Notes (Signed)
Subjective: No complaints this AM. She reports good urine output.  Objective: Vital signs in last 24 hours: Temp:  [97.6 F (36.4 C)] 97.6 F (36.4 C) (08/22 0550) Pulse Rate:  [78-82] 78 (08/22 0943) Resp:  [18] 18 (08/22 0550) BP: (118-131)/(75-79) 125/77 mmHg (08/22 0943) SpO2:  [97 %] 97 % (08/22 0550) Weight:  [153 lb 6.4 oz (69.582 kg)] 153 lb 6.4 oz (69.582 kg) (08/22 0550) Weight change: 3 lb 3.2 oz (1.452 kg)  Intake/Output from previous day: 08/21 0701 - 08/22 0700 In: 720 [P.O.:720] Out: -  Intake/Output this shift:   General: Elderly Caucasian female, resting in bed, NAD Cardiac: RRR, no rubs, murmurs or gallops Pulm: clear to auscultation bilaterally, no wheezes, rales, or rhonchi Ext: warm and well perfused, no pedal edema Neuro: responds to questions appropriately; moving all extremities freely   BMET:   Recent Labs  10/01/14 0450 10/03/14 1211  NA 135 137  K 3.2* 4.2  CL 91* 96*  CO2 31 29  GLUCOSE 95 119*  BUN 59* 61*  CREATININE 3.84* 3.86*  CALCIUM 8.3* 9.1    Scheduled: . amiodarone  200 mg Oral Daily  . apixaban  2.5 mg Oral BID  . atorvastatin  10 mg Oral q1800  . carvedilol  6.25 mg Oral BID WC  . feeding supplement (NEPRO CARB STEADY)  237 mL Oral TID WC  . furosemide  40 mg Oral Daily  . iron polysaccharides  150 mg Oral Q1200  . nystatin   Topical BID  . pantoprazole  40 mg Oral Daily  . polycarbophil  625 mg Oral BID  . potassium chloride  40 mEq Oral Daily  . saccharomyces boulardii  250 mg Oral BID  . triamcinolone cream   Topical BID  . tuberculin  5 Units Intradermal Once   Assessment/Plan: #1 Nonoliguric acute kidney injury: Likely hemodynamic ACE inhibitor use. Creatinine 3.9, essentially stable from 2 days prior. Continue Lasix 78m daily and standing dose of 40 mEq. explained to patient that we expect recovery of renal function albeit slowly. We will sign off for now though we'll be happy to be reconsulted if  necessary.   LOS: 6 days   PCharlott Rakes8/22/2016,1:16 PM   I have seen and examined this patient and agree with plan as outlined by Dr. PPosey Pronto  Scr has slowly improved and may take several weeks to return to baseline, although may have some residual CKD from the injury.  Nothing further to add and would recommend to continue to follow daily I's/O's and Scr until she is discharged and can follow up with Dr. MMercy Mooreafter discharge if she does not have complete resolution of her AKI.  Please call with any questions or concerns.  Will continue to monitor her labs during her stay. Avram Danielson A,MD 10/03/2014 1:46 PM

## 2014-10-03 NOTE — Progress Notes (Signed)
Recreational Therapy Assessment and Plan  Patient Details  Name: ANATALIA KRONK MRN: [id number] Date of Birth: [birth year] Today's Date: 10/03/2014  Rehab Potential: Good ELOS: 10 days   Assessment Clinical Impression:  Problem List:  Patient Active Problem List   Diagnosis Date Noted  . Physical debility 09/27/2014  . Respiratory distress   . Encounter for palliative care   . ARF (acute renal failure)   . Sepsis   . FUO (fever of unknown origin)   . Liver mass, left lobe   . Septic shock   . AKI (acute kidney injury) 09/15/2014  . SOB (shortness of breath) 09/15/2014  . Elevated troponin   . Chronic diastolic congestive heart failure   . Elevated WBC count   . Urinary tract infectious disease   . Blood poisoning   . Osteopenia of the elderly 05/13/2014  . Abdominal mass of other site 05/13/2014  . Diastolic CHF 44/96/7591  . Gout 08/16/2013  . Hyponatremia 08/10/2013  . Left atrial enlargement 01/10/2013  . A-fib 12/08/2012  . Rosacea 05/15/2012  . Breast cancer screening 03/14/2011  . Anemia due to chronic blood loss 08/28/2010  . Grief 04/29/2010  . DEGENERATIVE DISC DISEASE, LUMBAR SPINE 07/14/2009  . DVT 07/11/2009  . GERD 07/11/2009  . PERSONAL HX COLONIC POLYPS 07/11/2009  . HYPERCHOLESTEROLEMIA 04/10/2006  . HYPERTENSION, BENIGN SYSTEMIC 04/10/2006  . Pulmonary embolism and infarction 04/10/2006  . DIVERTICULOSIS OF COLON 04/10/2006  . ACTINIC KERATOSIS 04/10/2006  . GEN OSTEOARTHROSIS INVOLVING MULTIPLE SITES 04/10/2006    Past Medical History:  Past Medical History  Diagnosis Date  . S/P dilatation of esophageal stricture 2002  . Hypertension   . Hyperlipidemia   . DVT (deep venous thrombosis) 2009  . PE (pulmonary embolism) 2009  . GERD (gastroesophageal reflux disease)   . Diverticulosis   . Iron deficiency  anemia   . Hyperplastic colon polyp   . Positive H. pylori test 01/20/96  . Degenerative joint disease   . Skin cancer     h/o basal and squamous skin cancer--> removed  . Persistent atrial fibrillation   . Shortness of breath dyspnea   . AKI (acute kidney injury) 09/15/2014  . Colitis, ulcerative    Past Surgical History:  Past Surgical History  Procedure Laterality Date  . Dilation and curettage of uterus      for endometrial polyps  . Replacement total knee      left  . Tonsillectomy    . Appendectomy    . Breast lumpectomy      benign  . Joint replacement      Assessment & Plan Clinical Impression: Patient is a 81 y.o. year old female with history of hypertension, atrial fibrillation, pulmonary emboli/DVT maintained on Eliquis, systolic congestive heart failure. Independent with a walker prior to admission living alone. Patient had just signed contracts to enter [facility] independent living facility. Presented 09/15/2014 with increasing shortness of breath and dizziness on exertion. Denied chest pain or fever. Chest x-ray noted cardiomegaly without effusion or pulmonary edema. Creatinine elevated 2.25 as well as hyponatremia 128. White blood cell count 12,100. Troponin mildly elevated 0.28. Urinalysis negative. Cardiology service was consulted for elevated troponins with echocardiogram showing ejection fraction of 50% as well as septal hypokinesis. Serial follow-up troponins placed on amiodarone for cardiac rate control. Patient spiked a fever 104.7 rectally and increasing respiratory distress 09/16/2014 with rapid response contacted and placed on BiPAP.. Maintained on broad-spectrum anti-biotics. Nephrology services consulted in light of elevated creatinine as  he continued to elevate with latest creatinine 4.31 suspect acute renal failure secondary to ischemic ATN from bouts of hypotension as well as ACE inhibitor  that was discontinued. CT abdomen and pelvis showed questionable liver abscess versus mass. MRI of the liver shows a 2.5 cm well circumscribed hyperintense mass located between the intrahepatic IVC and right adrenal gland. Primary differential considerations included pheochromocytoma, sarcoma or possibly metastasis with workup ongoing. Infectious disease consulted suspect sepsis versus transient bacteremia and currently maintained on intravenous Merrem and later discontinued all anti-biotics with close observation. Physical therapy evaluation completed 09/21/2014 with noted deconditioning and recommendations of physical medicine rehabilitation consult. Palliative care was consulted 09/22/2014 to lessen burden of care. Patient transferred to CIR on 09/27/2014.      Pt presents with decreased activity tolerance & decreased functional mobility Limiting pt's independence with leisure/community pursuits.    Plan Rec Therapy Plan Is patient appropriate for Therapeutic Recreation?: Yes Rehab Potential: Good Treatment times per week: Min 1 time for community reintegration/outing Estimated Length of Stay: 10 days TR Treatment/Interventions: Adaptive equipment instruction;Community reintegration;Patient/family education  Recommendations for other services: None  Discharge Criteria: Patient will be discharged from TR if patient refuses treatment 3 consecutive times without medical reason.  If treatment goals not met, if there is a change in medical status, if patient makes no progress towards goals or if patient is discharged from hospital.  The above assessment, treatment plan, treatment alternatives and goals were discussed and mutually agreed upon: by patient  [place] 10/03/2014, 9:42 AM

## 2014-10-03 NOTE — Progress Notes (Addendum)
Physical Therapy Session Note  Patient Details  Name: Sara Russell MRN: [id number] Date of Birth: [birth year]  Today's Date: 10/03/2014 PT Individual Time: 4696-2952 PT Individual Time Calculation (min): 15 min   PT Concurrent Time: 1345-1430 PT Concurrent Time Calculation (min) 45 min  Short Term Goals: Week 1:  PT Short Term Goal 1 (Week 1): =LTG's due to ELOS  Skilled Therapeutic Interventions/Progress Updates:    Pt received seated on EOB with no c/o pain and agreeable to treatment. Gait training for two trials of 150' with RW and supervision. Two trials of 48' with no AD to improve safety and independence without RW. Requires CGA and noted short step length and increased double stance time. Performed obstacle course with navigating around cones and over weighted bars. Requires minA to maintain balance with initial attempt at stepping over bars due to pt not clearing foot and landing with heel on bar, as well as due to pt hesitancy and fear of falling. Improved over the next 2 trials. LE strengthening exercises including long arc quad, standing hip flexion and heel raises performed for 2 sets 10 reps. One more trial of obstacle course performed at completion of session with min guard. Pt ambulated to room with RW and CGA. Seated in bed with all needs within reach at completion of session.   Therapy Documentation Precautions:  Precautions Precautions: Fall Precaution Comments: decreased endurance Restrictions Weight Bearing Restrictions: No Pain: Pain Assessment Pain Assessment: No/denies pain Pain Score: 0-No pain   Function:     Locomotion Ambulation   Assistive device: Walker-rolling Max distance: 150 Assist level: Supervision or verbal cues  Walk 10 feet activity   Assist level: Supervision or verbal cues  Walk 50 feet with 2 turns activity   Assist level: Supervision or verbal cues  Walk 150 feet activity   Assist level: Supervision or verbal cues  Walk 10 feet  on uneven surfaces activity      Stairs          Walk up/down 1 step activity        Walk up/down 4 steps activity      Walk up/down 12 steps activity      Pick up small objects from floor      Wheelchair          Wheel 50 feet with 2 turns activity      Wheel 150 feet activity        Therapy/Group: Individual Therapy  Luberta Mutter 10/03/2014, 2:30 PM

## 2014-10-04 ENCOUNTER — Inpatient Hospital Stay (HOSPITAL_COMMUNITY): Payer: Medicare Other

## 2014-10-04 ENCOUNTER — Inpatient Hospital Stay (HOSPITAL_COMMUNITY): Payer: Medicare Other | Admitting: Physical Therapy

## 2014-10-04 ENCOUNTER — Inpatient Hospital Stay (HOSPITAL_COMMUNITY): Payer: Medicare Other | Admitting: Occupational Therapy

## 2014-10-04 LAB — RENAL FUNCTION PANEL
Albumin: 2.5 g/dL — ABNORMAL LOW (ref 3.5–5.0)
Anion gap: 8 (ref 5–15)
BUN: 59 mg/dL — ABNORMAL HIGH (ref 6–20)
CO2: 28 mmol/L (ref 22–32)
Calcium: 8.7 mg/dL — ABNORMAL LOW (ref 8.9–10.3)
Chloride: 100 mmol/L — ABNORMAL LOW (ref 101–111)
Creatinine, Ser: 3.72 mg/dL — ABNORMAL HIGH (ref 0.44–1.00)
GFR calc Af Amer: 12 mL/min — ABNORMAL LOW (ref >60)
GFR calc non Af Amer: 10 mL/min — ABNORMAL LOW (ref >60)
Glucose, Bld: 98 mg/dL (ref 65–99)
Phosphorus: 4.6 mg/dL (ref 2.5–4.6)
Potassium: 4.2 mmol/L (ref 3.5–5.1)
Sodium: 136 mmol/L (ref 135–145)

## 2014-10-04 NOTE — Progress Notes (Signed)
[facility] PHYSICAL MEDICINE & REHABILITATION     PROGRESS NOTE    Subjective/Complaints: Was fatigued from therapy yesterday. No new complaints. Pleased with progress/staff    ROS: Pt denies fever, rash/itching, headache,, nausea, vomiting, abdominal pain, chest pain, shortness of breath, palpitations, dysuria, dizziness,    Objective: Vital Signs: Blood pressure 111/68, pulse 89, temperature 97.9 F (36.6 C), temperature source Oral, resp. rate 18, height 5' 1"  (1.549 m), weight 64.9 kg (143 lb 1.3 oz), SpO2 96 %. No results found. No results for input(s): WBC, HGB, HCT, PLT in the last 72 hours.  Recent Labs  10/03/14 1211 10/04/14 0640  NA 137 136  K 4.2 4.2  CL 96* 100*  GLUCOSE 119* 98  BUN 61* 59*  CREATININE 3.86* 3.72*  CALCIUM 9.1 8.7*   CBG (last 3)  No results for input(s): GLUCAP in the last 72 hours.  Wt Readings from Last 3 Encounters:  10/04/14 64.9 kg (143 lb 1.3 oz)  09/27/14 71.804 kg (158 lb 4.8 oz)  05/16/14 70.217 kg (154 lb 12.8 oz)    Physical Exam:  Constitutional: She is oriented to person, place, and time.  HENT: oral mucosa pink and moist Head: Normocephalic.  Eyes: EOM are normal.  Neck: Normal range of motion. Neck supple. No thyromegaly present.  Cardiovascular:  Cardiac rate controlled, irregularly irregular  Respiratory: Effort normal and breath sounds normal--occ crackle at bases. No respiratory distress.  GI: Soft. Bowel sounds are normal. She exhibits no distension. There is no tenderness.  Neurological: She is alert and oriented to person, place, and time.  Motor strength is 4/5 bilateral deltoids, biceps, triceps, grip, hip flexor, knee extensor, ankle dorsiflexor and plantar flexor, DTR's 2+ Sensation is intact to light touch in bilateral upper and lower limbs Mood and affect are appropriate There is no evidence of dysarthria, good phonation. Coordination appears normal  Good insight and awareness.  Skin:  intact, warm---mild redness/excoriation around right buttock and gluteal folds---improving Psych: pleasant and appropriate  Assessment/Plan: 1. Functional deficits secondary to debility after multiple medical which require 3+ hours per day of interdisciplinary therapy in a comprehensive inpatient rehab setting. Physiatrist is providing close team supervision and 24 hour management of active medical problems listed below. Physiatrist and rehab team continue to assess barriers to discharge/monitor patient progress toward functional and medical goals.  Function:  Bathing Bathing position   Position: Shower  Bathing parts Body parts bathed by patient: Right arm, Right upper leg, Left arm, Left upper leg, Abdomen, Chest, Right lower leg, Front perineal area, Left lower leg, Buttocks, Back    Bathing assist Assist Level: Supervision or verbal cues      Upper Body Dressing/Undressing Upper body dressing   What is the patient wearing?: Pull over shirt/dress, Bra Bra - Perfomed by patient: Thread/unthread right bra strap, Thread/unthread left bra strap, Hook/unhook bra (pull down sports bra)   Pull over shirt/dress - Perfomed by patient: Thread/unthread right sleeve, Thread/unthread left sleeve, Put head through opening, Pull shirt over trunk          Upper body assist Assist Level: More than reasonable time      Lower Body Dressing/Undressing Lower body dressing   What is the patient wearing?: Underwear, Pants, Socks, Shoes Underwear - Performed by patient: Pull underwear up/down, Thread/unthread right underwear leg, Thread/unthread left underwear leg Underwear - Performed by helper: Thread/unthread right underwear leg, Thread/unthread left underwear leg (due to putting over shoes PT assisted pt) Pants- Performed by patient: Thread/unthread  right pants leg, Thread/unthread left pants leg, Pull pants up/down, Fasten/unfasten pants Pants- Performed by helper: Thread/unthread right pants  leg Non-skid slipper socks- Performed by patient: Don/doff right sock, Don/doff left sock   Socks - Performed by patient: Don/doff right sock, Don/doff left sock   Shoes - Performed by patient: Don/doff left shoe, Don/doff right shoe            Lower body assist Assist Level: Supervision or verbal cues   Set up : To obtain clothing/put away  Toileting Toileting   Toileting steps completed by patient: Adjust clothing prior to toileting, Performs perineal hygiene, Adjust clothing after toileting Toileting steps completed by helper: Performs perineal hygiene (pt has a hemmorhoid,assisted to get pt clean) Toileting Assistive Devices: Grab bar or rail  Toileting assist Assist level: Supervision or verbal cues   Transfers Chair/bed transfer   Chair/bed transfer method: Ambulatory Chair/bed transfer assist level: Supervision or verbal cues Chair/bed transfer assistive device: Careers adviser device: Walker-rolling Max distance: 150 Assist level: Supervision or Psychologist, clinical activity did not occur:  (gait to/from therapy) Type: Manual Max wheelchair distance: 100 Assist Level: Dependent (Pt equals 0%)  Cognition Comprehension Comprehension assist level: Follows complex conversation/direction with no assist  Expression Expression assist level: Expresses complex ideas: With no assist  Social Interaction Social Interaction assist level: Interacts appropriately with others - No medications needed.  Problem Solving Problem solving assist level: Solves complex problems: Recognizes & self-corrects  Memory Memory assist level: More than reasonable amount of time   Medical Problem List and Plan: 1. Functional deficits secondary to debilitation related to sepsis with acute renal failure: making functional progress  -team conference today 2. DVT Prophylaxis/Anticoagulation: eliquis 3. Pain Management: under control 4. Acute renal  failure secondary to ischemic ATN.  -labs showing slow gradual improvement  -lasix diuresis per nephrology--they signed off-- i would think an outpatient follow up visit would be indicated  -making urine 5. Neuropsych: This patient is capable of making decisions on her own behalf. 6. Skin/Wound Care:  Fungal powder, oob, pressure relief to buttocks 7. Fluids/Electrolytes/Nutrition: encourage PO, 50-100% meals, ~672m fluid 8. ID/sepsis/liver mass. All antibiotics currently discontinued  9. Hypertension/atrial fibrillation. Continue amiodarone 200 mg daily, Coreg 3.125 mg twice a day.BP 119/64 this am    10. Systolic congestive heart failure: edema resolved  -lasix  -weight 65kg 11. Hyperlipidemia. Lipitor 12. Loose stool: fiber and probiotic added  -encourage po 13.  Hypokalemia---K+ supplement  -  .   LOS (Days) 7 A FACE TO FACE EVALUATION WAS PERFORMED  Saleem Coccia T 10/04/2014 7:54 AM

## 2014-10-04 NOTE — Progress Notes (Signed)
Occupational Therapy Session Note  Patient Details  Name: Sara Russell MRN: [id number] Date of Birth: 11-19-1930  Today's Date: 10/04/2014 OT Individual Time: 1030-1130 OT Individual Time Calculation (min): 60 min   Short Term Goals: Week 1:  OT Short Term Goal 1 (Week 1): STG=LTG due to ELOS  Skilled Therapeutic Interventions/Progress Updates: Therapeutic activity with focus on improved endurance, functional mobility using RW, dynamic standing balance, and therex for BUE.   Pt re-educated on benefit of RW and on her specific fall risk d/t impaired gait (left foot drag, hx of multiple falls and injuries) however pt persisted with her intent to avoid use of AD.   Pt accepted challenge to ambulate to washer/dryer, retreive clothing standing at dryer, and return to room for re-ed on BUE therex using previously provided HEP.   Pt completed mobility and balance task with 3 rest breaks, pushing w/c as simulating use of rollator approx 300', and retrieving clothing with min assist to reach to back of dryer.    Pt completed HEP with mod instructional cues however inattentive to correction to technique offered or new instruction presented on improving AROM at right shoulder.   Pt left in recliner at end of session with call light within reach.       Therapy Documentation Precautions:  Precautions Precautions: Fall Precaution Comments: decreased endurance Restrictions Weight Bearing Restrictions: No  Pain: No/denies pain    Therapy/Group: Individual Therapy  [place] 10/04/2014, 12:23 PM

## 2014-10-04 NOTE — Progress Notes (Signed)
Occupational Therapy Session Note  Patient Details  Name: Sara Russell MRN: [id number] Date of Birth: [birth year]  Today's Date: 10/04/2014 OT Individual Time: 0730-0900 OT Individual Time Calculation (min): 90 min    Short Term Goals: Week 1:  OT Short Term Goal 1 (Week 1): STG=LTG due to ELOS  Skilled Therapeutic Interventions/Progress Updates:    Pt seen for OT ADL session. Pt in supine upon arrival. She denied bathing task today. See below for required amount of assist for transfers and dressing task. She ambulated throughout room using RW to gather clothing and dressed seated EOB. She stood at sink to complete grooming task with distant supervision. She demonstrated good functional activity endurance, ambulating and standing to complete functional tasks without need for rest breaks and no s/s of fatigue. Pt gathered clothing items in prep for laundry. She ambulated throughout unit pushing w/c with supervision. She completed laundry task, ambulating into room with AD and placed laundry in/ removed laundry with supervision. She then completed simple meal prep task, fixing self cup of coffee with supervision. She ambulated back to room at end of session, left sitting up in w/c with all needs in reach. She required seated rest breaks throughout functional ambulation due to decreased functional endurance. Alternated btwn using RW, pushing w/c, and no AD when completing functional ambulation today. Noted pt with slower walking speed when not using steadying device. Educated pt regarding fall risk, not holding onto furniture during ambulation, energy conservation, and d/c planning.   Therapy Documentation Precautions:  Precautions Precautions: Fall Precaution Comments: decreased endurance Restrictions Weight Bearing Restrictions: No Pain: Pain Assessment Pain Assessment: No/denies pain  Function:   Eating Eating   Eating Assist Level: No help, No cues           Grooming Oral  Care,Brush Teeth, Clean Dentures Activity:      Assist Level: Supervision or verbal cues (Standing at sink)      Wash, Rinse, Dry Face Activity   Assist Level: Supervision or verbal cues (In standing)      Wash, Rinse, Dry Hands Activity   Assist Level: Supervision or verbal cues (In standing)      Brush, Comb Hair Activity        Shave Activity          Apply Makeup Activity                                                              Upper Body Dressing/Undressing Upper body dressing   What is the patient wearing?: Pull over shirt/dress;Bra Bra - Perfomed by patient: Thread/unthread right bra strap;Thread/unthread left bra strap;Hook/unhook bra (pull down sports bra)   Pull over shirt/dress - Perfomed by patient: Thread/unthread right sleeve;Thread/unthread left sleeve;Put head through opening;Pull shirt over trunk          Upper body assist Assist Level: More than reasonable time       Lower Body Dressing/Undressing Lower body dressing   What is the patient wearing?: Underwear;Pants;Socks;Shoes Underwear - Performed by patient: Pull underwear up/down;Thread/unthread right underwear leg;Thread/unthread left underwear leg   Pants- Performed by patient: Thread/unthread right pants leg;Thread/unthread left pants leg;Pull pants up/down;Fasten/unfasten pants       Socks - Performed by patient: Don/doff right sock;Don/doff left sock   Shoes -  Performed by patient: Don/doff left shoe;Don/doff right shoe            Lower body assist Assist Level: Supervision or verbal cues        Bed Mobility Roll left and right activity      Sit to lying activity      Lying to sitting activity   Assist level: No help, No cues, assistive device, takes more than a reasonable amount of time  Mobility details     Transfers Sit to stand transfer   Sit to stand assist level: Supervision or verbal cues Sit to stand assistive device: Walker  Chair/bed transfer   Chair/bed  transfer method: Ambulatory Chair/bed transfer assist level: Supervision or verbal cues Chair/bed transfer assistive device: [place] Comprehension Comprehension assist level: Follows complex conversation/direction with no assist  Expression Expression assist level: Expresses complex ideas: With no assist  Social Interaction Social Interaction assist level: Interacts appropriately with others - No medications needed.  Problem Solving Problem solving assist level: Solves complex problems: Recognizes & self-corrects  Memory Memory assist level: More than reasonable amount of time    Therapy/Group: Individual Therapy  [place], Vauda Salvucci C 10/04/2014, 8:07 AM

## 2014-10-04 NOTE — Plan of Care (Signed)
Problem: RH Wheelchair Mobility Goal: LTG Patient will propel w/c in community environment (PT) LTG: Patient will propel wheelchair in community environment, # of feet with assist (PT)  Outcome: Not Applicable Date Met:  10/04/14 D/C due to pt ambulatory 10/04/14     

## 2014-10-04 NOTE — Progress Notes (Signed)
Physical Therapy Session Note  Patient Details  Name: Sara Russell MRN: [id number] Date of Birth: 1930/11/28  Today's Date: 10/04/2014 PT Individual Time: 1400-1500 PT Individual Time Calculation (min): 60 min   Short Term Goals: Week 1:  PT Short Term Goal 1 (Week 1): =LTG's due to ELOS  Skilled Therapeutic Interventions/Progress Updates:    Patient seen with focus of treatment on walking with cane, LE/core strength and balance.  Participated in strengthening exercises as noted below.  Also performed balance activities with UE support in parallel bars versus with hands hovering over bars to include alternate taps to cone, side stepping, tandem walk, and tandem stand.  Completed DGI as noted below with use of RW.  Was able to ambulate up to about 65' with [facility] and close supervision with significantly decreased walking speed.  Negotiated obstacle course around and over obstacles with both walker and cane with supervision.  Educated patient likely will be best to use walker upon d/c and continue to work towards less restrictive device in home health setting.  Discussed with patient likely best to transition to ALF at [facility] prior to trying to go into independent living.  Patient continues with severe difficulty on stairs and seems to understand she will not be able to return to her split level home at this time.  Patient left sitting edge of bed with all bell in reach.  Stated awaiting phone calls this afternoon from [place].  Therapy Documentation Precautions:  Precautions Precautions: Fall Precaution Comments: decreased endurance Restrictions Weight Bearing Restrictions: No Pain: Pain Assessment Pain Assessment: No/denies pain  Balance: Standardized Balance Assessment Standardized Balance Assessment: Dynamic Gait Index Dynamic Gait Index Level Surface: Mild Impairment Change in Gait Speed: Mild Impairment Gait with Horizontal Head Turns: Mild Impairment Gait with Vertical  Head Turns: Mild Impairment Gait and Pivot Turn: Mild Impairment Step Over Obstacle: Moderate Impairment Step Around Obstacles: Moderate Impairment Steps: Moderate Impairment Total Score: 13 Exercises: General Exercises - Lower Extremity Hip ABduction/ADduction: Strengthening;15 reps;Standing (with yellow theraband in parallel bars) Heel Raises: Strengthening;Both;10 reps;Standing (holding bars in parallel bars) Repetitive Sit to Stands: No upper extremities;Other (comment) (5 reps up from elevated mat min assist) Repetitive Sit to Stands Level of Assist: Min Repetitive Sit to Stands Surface Height: approx 20" Other Exercises Other Exercises: standing hip extension with yellow theraband x 10 reps in parallel bars Other Exercises: bridging x 10 3 sec hold in supine Other Exercises: lateral trunk rotation x 10 in supine  Function:  Toileting Toileting             Bed Mobility Roll left and right activity   Assist level: Supervision or verbal cues    Sit to lying activity   Assist level: Supervision or verbal cues    Lying to sitting activity   Assist level: No help, No cues, assistive device, takes more than a reasonable amount of time Lying to sitting assistive device: Bedrails  Mobility details     Transfers Sit to stand transfer   Sit to stand assist level: Supervision or verbal cues Sit to stand assistive device: Walker  Chair/bed transfer   Chair/bed transfer method: Ambulatory Chair/bed transfer assist level: Supervision or verbal cues Chair/bed transfer assistive device: Banker device: Walker-rolling;Cane-straight Max distance: 150 Assist level:  Touching or steadying assistance (Pt > 75%) (occasional minguard with cane, supervision with walker)  Walk 10 feet activity   Assist level: Supervision or verbal cues  Walk 50 feet with 2 turns activity   Assist  level: Supervision or verbal cues  Walk 150 feet activity   Assist level: Supervision or verbal cues  Walk 10 feet on uneven surfaces activity      Stairs   Stairs assistive device: 1 hand rail;2 hand rails Max number of stairs: 4 Stairs assist level: Touching or steadying assistance (Pt > 75%)  Walk up/down 1 step activity        Walk up/down 4 steps activity   Walk up/down 4 steps assist level: Touching or steadying assistance (Pt > 75%)  Walk up/down 12 steps activity      Pick up small objects from floor      Wheelchair          Wheel 50 feet with 2 turns activity      Wheel 150 feet activity       Cognition Comprehension Comprehension assist level: Follows complex conversation/direction with no assist  Expression Expression assist level: Expresses complex ideas: With no assist  Social Interaction Social Interaction assist level: Interacts appropriately with others - No medications needed.  Problem Solving Problem solving assist level: Solves complex problems: Recognizes & self-corrects  Memory Memory assist level: More than reasonable amount of time    Therapy/Group: Individual Therapy  [place], Wh[place] 10/04/2014  10/04/2014, 3:33 PM

## 2014-10-05 ENCOUNTER — Inpatient Hospital Stay (HOSPITAL_COMMUNITY): Payer: Medicare Other | Admitting: *Deleted

## 2014-10-05 ENCOUNTER — Inpatient Hospital Stay (HOSPITAL_COMMUNITY): Payer: Medicare Other | Admitting: Occupational Therapy

## 2014-10-05 ENCOUNTER — Inpatient Hospital Stay (HOSPITAL_COMMUNITY): Payer: Medicare Other | Admitting: Physical Therapy

## 2014-10-05 LAB — RENAL FUNCTION PANEL
Albumin: 2.8 g/dL — ABNORMAL LOW (ref 3.5–5.0)
Anion gap: 11 (ref 5–15)
BUN: 61 mg/dL — ABNORMAL HIGH (ref 6–20)
CO2: 29 mmol/L (ref 22–32)
Calcium: 8.9 mg/dL (ref 8.9–10.3)
Chloride: 96 mmol/L — ABNORMAL LOW (ref 101–111)
Creatinine, Ser: 3.73 mg/dL — ABNORMAL HIGH (ref 0.44–1.00)
GFR calc Af Amer: 12 mL/min — ABNORMAL LOW (ref >60)
GFR calc non Af Amer: 10 mL/min — ABNORMAL LOW (ref >60)
Glucose, Bld: 105 mg/dL — ABNORMAL HIGH (ref 65–99)
Phosphorus: 4.4 mg/dL (ref 2.5–4.6)
Potassium: 4.1 mmol/L (ref 3.5–5.1)
Sodium: 136 mmol/L (ref 135–145)

## 2014-10-05 NOTE — Progress Notes (Signed)
Occupational Therapy Discharge Summary  Patient Details  Name: Sara Russell MRN: [id number] Date of Birth: December 11, 1930  Patient has met 11 of 11 long term goals due to improved activity tolerance, improved balance and postural control.  Patient to discharge at overall Mod I level.  Patient is discharging to assisted living facility before transitioning to independent living faciity.   Recommendation:  Patient discharging to assisted living facility before transitioning to independent living. She will benefit from time to acclimate to new living arrangement  Including increasing confidence with mobility during ADL/IADL tasks before transitioning to independent living.   Equipment: No equipment provided  Reasons for discharge: treatment goals met and discharge from hospital  Patient/family agrees with progress made and goals achieved: Yes  OT Discharge Precautions/Restrictions  Precautions Precautions: Fall Precaution Comments: decreased endurance Restrictions Weight Bearing Restrictions: No Vision/Perception  Vision- History Baseline Vision/History: Wears glasses Wears Glasses: Reading only Patient Visual Report: No change from baseline  Cognition Overall Cognitive Status: Within Functional Limits for tasks assessed Arousal/Alertness: Awake/alert Orientation Level: Oriented X4 Memory: Appears intact Awareness: Appears intact Problem Solving: Appears intact Safety/Judgment: Appears intact Sensation Sensation Light Touch: Appears Intact Proprioception: Appears Intact Coordination Gross Motor Movements are Fluid and Coordinated: Yes Fine Motor Movements are Fluid and Coordinated: Yes Motor  Motor Motor: Within Functional Limits  Trunk/Postural Assessment  Cervical Assessment Cervical Assessment: Exceptions to [facility] Cervical Strength Overall Cervical Strength Comments: forward head posture Thoracic Assessment Thoracic Assessment: Exceptions to [facility] Thoracic  Strength Overall Thoracic Strength Comments: increased kyphosis, rounded shoulder Postural Control Postural Limitations: Pt with increased reliance on UEs for balance during all mobility, slow power production for stepping strategies  Balance Balance Balance Assessed: Yes Static Sitting Balance Static Sitting - Level of Assistance: 7: Independent Static Standing Balance Static Standing - Balance Support: During functional activity;Bilateral upper extremity supported Static Standing - Level of Assistance: 6: Modified independent (Device/Increase time) Dynamic Standing Balance Dynamic Standing - Balance Support: During functional activity Dynamic Standing - Level of Assistance: 6: Modified independent (Device/Increase time) Extremity/Trunk Assessment RUE Assessment RUE Assessment: Within Functional Limits LUE Assessment LUE Assessment: Within Functional Limits  Function:  Grooming Oral Care,Brush Teeth, Clean Dentures Activity:      Assist Level: More than reasonable amount of time      Wash, Rinse, Dry Face Activity   Assist Level: More than reasonable time      Wash, Rinse, Dry Hands Activity   Assist Level: Assistive device      Brush, Comb Hair Activity   Assist Level: No help, No cues    Shave Activity Shave activity did not occur: Patient does not shave        Apply Makeup Activity Apply makeup activity did not occur: Patient does not wear makeup                                                           Bathing Bathing position   Position: Shower  Bathing parts Body parts bathed by patient: Right arm;Right upper leg;Left arm;Left upper leg;Abdomen;Chest;Right lower leg;Front perineal area;Left lower leg;Buttocks;Back    Bathing assist Assist Level: More than reasonable time       Upper Body Dressing/Undressing Upper body dressing   What is the patient wearing?: Pull over shirt/dress;Bra Bra - Perfomed by patient: Thread/unthread  right bra  strap;Thread/unthread left bra strap;Hook/unhook bra (pull down sports bra)   Pull over shirt/dress - Perfomed by patient: Thread/unthread right sleeve;Thread/unthread left sleeve;Put head through opening;Pull shirt over trunk          Upper body assist Assist Level: No help, No cues       Lower Body Dressing/Undressing Lower body dressing   What is the patient wearing?: Underwear;Pants;Socks;Shoes Underwear - Performed by patient: Pull underwear up/down;Thread/unthread right underwear leg;Thread/unthread left underwear leg   Pants- Performed by patient: Thread/unthread right pants leg;Thread/unthread left pants leg;Pull pants up/down;Fasten/unfasten pants       Socks - Performed by patient: Don/doff right sock;Don/doff left sock   Shoes - Performed by patient: Don/doff left shoe;Don/doff right shoe;Fasten right;Fasten left            Lower body assist Assist Level: More than reasonable time;Assistive device Assistive Device Comment: Sit <> stand from EOB using RW to pull pants up/ down     Toileting Toileting   Toileting steps completed by patient: Adjust clothing prior to toileting;Performs perineal hygiene;Adjust clothing after toileting   Toileting Assistive Devices: Grab bar or rail  Toileting assist Assist level: More than reasonable time    Bed Mobility Roll left and right activity   Assist level: No help, No cues, assistive device, takes more than a reasonable amount of time  Sit to lying activity   Assist level: No help, No cues, assistive device, takes more than a reasonable amount of time  Lying to sitting activity   Assist level: No help, No cues, assistive device, takes more than a reasonable amount of time  Mobility details     Transfers Sit to stand transfer   Sit to stand assist level: No help, no cues, assistive device, takes more than a reasonable amount of time Sit to stand assistive device: Walker  Chair/bed transfer   Chair/bed transfer  method: Ambulatory Chair/bed transfer assist level: No Help, no cues, assistive device, takes more than a reasonable amount of time Chair/bed transfer assistive device: Public librarian transfer assistive device: Grab bar;Walker;Elevated toilet seat/BSC over toilet       Assist level to toilet: No Help, no cues, assistive device, takes more than a reasonable amount of time Assist level from toilet: No Help, no cues, assistive device, takes more than a reasonable amount of time  Tub/shower transfer   Tub/shower assistive device: Engineer, structural   Assist level into tub: Supervision or verbal cues Assist level out of tub: Supervision or verbal cues   Cognition Comprehension    Expression Expression assist level: Expresses complex ideas: With no assist  Social Interaction Social Interaction assist level: Interacts appropriately with others - No medications needed.  Problem Solving Problem solving assist level: Solves complex problems: Recognizes & self-corrects  Memory Memory assist level: More than reasonable amount of time       Lewis, Kaziah Krizek C 10/06/2014, 4:11 PM

## 2014-10-05 NOTE — Progress Notes (Signed)
Recreational Therapy Session Note  Patient Details  Name: Sara Russell MRN: [id number] Date of Birth: 27-Dec-1930 Today's Date: 10/05/2014  Pain: no c/o Skilled Therapeutic Interventions/Progress Updates: Pt participated in community reintegration/outing to [employer] for lunch at overall supervision ambulatory level with exception of ascending & descending van.  Pt required min assist for  2/3 steps, mod assist x1 for 13" step.  Goals focused on safe functional mobility on various community surface types, identification & negotiation of obstacles, energy conservation techniques, & accessing public restrooms using RW.  See outing goal sheet for full details.  Therapy/Group: [employer] Anita Laguna 10/05/2014, 2:22 PM

## 2014-10-05 NOTE — Progress Notes (Signed)
[place] PHYSICAL MEDICINE & REHABILITATION     PROGRESS NOTE    Subjective/Complaints: Overall feeling better/stronger. Buttocks feels much better. Pleased with progress.    ROS: Pt denies fever, rash/itching, headache,, nausea, vomiting, abdominal pain, chest pain, shortness of breath, palpitations, dysuria, dizziness,    Objective: Vital Signs: Blood pressure 122/80, pulse 72, temperature 97.9 F (36.6 C), temperature source Oral, resp. rate 18, height 5' 1"  (1.549 m), weight 65.318 kg (144 lb), SpO2 98 %. No results found. No results for input(s): WBC, HGB, HCT, PLT in the last 72 hours.  Recent Labs  10/04/14 0640 10/05/14 0536  NA 136 136  K 4.2 4.1  CL 100* 96*  GLUCOSE 98 105*  BUN 59* 61*  CREATININE 3.72* 3.73*  CALCIUM 8.7* 8.9   CBG (last 3)  No results for input(s): GLUCAP in the last 72 hours.  Wt Readings from Last 3 Encounters:  10/05/14 65.318 kg (144 lb)  09/27/14 71.804 kg (158 lb 4.8 oz)  05/16/14 70.217 kg (154 lb 12.8 oz)    Physical Exam:  Constitutional: She is oriented to person, place, and time.  HENT: oral mucosa pink and moist Head: Normocephalic.  Eyes: EOM are normal.  Neck: Normal range of motion. Neck supple. No thyromegaly present.  Cardiovascular:  Cardiac rate controlled, irregularly irregular  Respiratory: Effort normal and breath sounds normal--occ crackle at bases. No respiratory distress.  GI: Soft. Bowel sounds are normal. She exhibits no distension. There is no tenderness.  Neurological: She is alert and oriented to person, place, and time.  Motor strength is 4/5 bilateral deltoids, biceps, triceps, grip, hip flexor, knee extensor, ankle dorsiflexor and plantar flexor, DTR's 2+ Sensation is intact to light touch in bilateral upper and lower limbs Mood and affect are appropriate There is no evidence of dysarthria, good phonation. Coordination appears normal  Good insight and awareness.  Skin: intact,  warm---mild redness/excoriation around right buttock and gluteal folds---improving Psych: pleasant and appropriate  Assessment/Plan: 1. Functional deficits secondary to debility after multiple medical which require 3+ hours per day of interdisciplinary therapy in a comprehensive inpatient rehab setting. Physiatrist is providing close team supervision and 24 hour management of active medical problems listed below. Physiatrist and rehab team continue to assess barriers to discharge/monitor patient progress toward functional and medical goals.  Function:  Bathing Bathing position   Position: Shower  Bathing parts Body parts bathed by patient: Right arm, Right upper leg, Left arm, Left upper leg, Abdomen, Chest, Right lower leg, Front perineal area, Left lower leg, Buttocks, Back    Bathing assist Assist Level: Supervision or verbal cues      Upper Body Dressing/Undressing Upper body dressing   What is the patient wearing?: Pull over shirt/dress, Bra Bra - Perfomed by patient: Thread/unthread right bra strap, Thread/unthread left bra strap, Hook/unhook bra (pull down sports bra)   Pull over shirt/dress - Perfomed by patient: Thread/unthread right sleeve, Thread/unthread left sleeve, Put head through opening, Pull shirt over trunk          Upper body assist Assist Level: More than reasonable time      Lower Body Dressing/Undressing Lower body dressing   What is the patient wearing?: Underwear, Pants, Socks, Shoes Underwear - Performed by patient: Pull underwear up/down, Thread/unthread right underwear leg, Thread/unthread left underwear leg Underwear - Performed by helper: Thread/unthread right underwear leg, Thread/unthread left underwear leg (due to putting over shoes PT assisted pt) Pants- Performed by patient: Thread/unthread right pants leg, Thread/unthread left  pants leg, Pull pants up/down, Fasten/unfasten pants Pants- Performed by helper: Thread/unthread right pants  leg Non-skid slipper socks- Performed by patient: Don/doff right sock, Don/doff left sock   Socks - Performed by patient: Don/doff right sock, Don/doff left sock   Shoes - Performed by patient: Don/doff left shoe, Don/doff right shoe            Lower body assist Assist Level: Supervision or verbal cues   Set up : To obtain clothing/put away  Toileting Toileting   Toileting steps completed by patient: Adjust clothing prior to toileting, Performs perineal hygiene, Adjust clothing after toileting Toileting steps completed by helper: Performs perineal hygiene (pt has a hemmorhoid,assisted to get pt clean) Toileting Assistive Devices: Grab bar or rail  Toileting assist Assist level: Supervision or verbal cues   Transfers Chair/bed transfer   Chair/bed transfer method: Ambulatory Chair/bed transfer assist level: Supervision or verbal cues Chair/bed transfer assistive device: Careers adviser device: Walker-rolling Max distance: 150 Assist level: Touching or steadying assistance (Pt > 75%) (occasional minguard with cane, supervision with walker)   Wheelchair Wheelchair activity did not occur:  (gait to/from therapy) Type: Manual Max wheelchair distance: 100 Assist Level: Dependent (Pt equals 0%)  Cognition Comprehension Comprehension assist level: Follows complex conversation/direction with no assist  Expression Expression assist level: Expresses complex ideas: With no assist  Social Interaction Social Interaction assist level: Interacts appropriately with others - No medications needed.  Problem Solving Problem solving assist level: Solves complex problems: Recognizes & self-corrects  Memory Memory assist level: More than reasonable amount of time   Medical Problem List and Plan: 1. Functional deficits secondary to debilitation related to sepsis with acute renal failure: making functional progress  -transition to ALF and then eventually to  independent living apt at [facility] 8/26 2. DVT Prophylaxis/Anticoagulation: eliquis 3. Pain Management: under control 4. Acute renal failure secondary to ischemic ATN.  -labs stable to showing slow gradual improvement. I personally reviewed the patient's labs today.   -lasix diuresis per nephrology--they signed off-- they will follow up with her as an outpt if Cr not back at baseline before discharge.  -making urine 5. Neuropsych: This patient is capable of making decisions on her own behalf. 6. Skin/Wound Care:  Fungal powder, oob, pressure relief to buttocks 7. Fluids/Electrolytes/Nutrition: encourage PO, 50-100% meals, ~636m fluid 8. ID/sepsis/liver mass. All antibiotics currently discontinued  9. Hypertension/atrial fibrillation. Continue amiodarone 200 mg daily, Coreg 3.125 mg twice a day.BP 119/64 this am    10. Systolic congestive heart failure: edema resolved  -lasix 423mqd--dc home on this.  -weight 65kg 11. Hyperlipidemia. Lipitor 12. Loose stool: fiber and probiotic added  -encourage po 13.  Hypokalemia---K+ supplement  -potassium normal on labs today  .   LOS (Days) 8 A FACE TO FACE EVALUATION WAS PERFORMED  Alonnie Bieker T 10/05/2014 8:02 AM

## 2014-10-05 NOTE — Progress Notes (Signed)
Occupational Therapy Session Note  Patient Details  Name: ARLETTE SCHAAD MRN: [id number] Date of Birth: 1930/12/16  Today's Date: 10/05/2014 OT Individual Time: 5956-3875 OT Individual Time Calculation (min): 60 min    Short Term Goals: Week 1:  OT Short Term Goal 1 (Week 1): STG=LTG due to ELOS  Skilled Therapeutic Interventions/Progress Updates:    Pt seen for OT ADL bathing and dressing session. Pt sitting EOB upon arrival eating breakfast, agreeable to tx session. She ambulated throughout room using RW to gather clothing items requiring VCs to remain inside walker when completing functional tasks. See below for required assist for transfers, and bathing/ dressing tasks.  Pt with decreased functional activity tolerance, requiring rest breaks throughout functional tasks.She demonstrated good carry over of education for safety awareness during dressing task, including sequencing of tasks.  Pt ambulated to ADL apartment using RW. She completed bed mobility on standard bed at mod I level. She simulated making of bed, requiring verbal and tactile cues for RW management to stay inside walker during all functional tasks as she has tendency to leave walker to side. Pt then completed simulated shower stall transfer, completing with supervision and cues for technique utilizing hand rail as she will have grab bar in shower. Pt returned to therapy gym where she was left sitting on EOM awaiting PT session.  Pt educated throughout session regarding fall risk, energy conservation, and d/c planning.   Therapy Documentation Precautions:  Precautions Precautions: Fall Precaution Comments: decreased endurance Restrictions Weight Bearing Restrictions: No Pain: Pain Assessment Pain Assessment: No/denies pain  Function:   Eating Eating   Eating Assist Level: No help, No cues           Grooming Oral Care,Brush Teeth, Clean Dentures Activity:    Oral care, brush teeth, clean dentures activity did  not occur: N/A        Wash, Rinse, Dry Face Activity          Wash, Rinse, Dry Hands Activity          Brush, Comb Hair Activity   Assist Level: More than reasonable time    Shave Activity          Apply Makeup Activity                                                             Bathing Bathing position   Position: Shower  Bathing parts Body parts bathed by patient: Right arm;Right upper leg;Left arm;Left upper leg;Abdomen;Chest;Right lower leg;Front perineal area;Left lower leg;Buttocks;Back    Bathing assist Assist Level: Set up (Completed in sitting)   Set up : To obtain items   Upper Body Dressing/Undressing Upper body dressing   What is the patient wearing?: Pull over shirt/dress;Bra Bra - Perfomed by patient: Thread/unthread right bra strap;Thread/unthread left bra strap;Hook/unhook bra (pull down sports bra)   Pull over shirt/dress - Perfomed by patient: Thread/unthread right sleeve;Thread/unthread left sleeve;Put head through opening;Pull shirt over trunk          Upper body assist Assist Level: More than reasonable time       Lower Body Dressing/Undressing Lower body dressing   What is the patient wearing?: Underwear;Pants;Socks;Shoes Underwear - Performed by patient: Pull underwear up/down;Thread/unthread right underwear leg;Thread/unthread left underwear leg   Pants- Performed by patient:  Thread/unthread right pants leg;Thread/unthread left pants leg;Pull pants up/down;Fasten/unfasten pants       Socks - Performed by patient: Don/doff right sock;Don/doff left sock   Shoes - Performed by patient: Don/doff left shoe;Don/doff right shoe            Lower body assist Assist Level: Supervision or verbal cues       Toileting Toileting   Toileting steps completed by patient: Adjust clothing prior to toileting;Performs perineal hygiene;Adjust clothing after toileting   Toileting Assistive Devices: Grab bar or rail  Toileting assist Assist  level: Supervision or verbal cues    Bed Mobility Roll left and right activity   Assist level: No help, No cues, assistive device, takes more than a reasonable amount of time  Sit to lying activity   Assist level: No help, No cues, assistive device, takes more than a reasonable amount of time  Lying to sitting activity   Assist level: No help, No cues, assistive device, takes more than a reasonable amount of time  Mobility details     Transfers Sit to stand transfer   Sit to stand assist level: Supervision or verbal cues Sit to stand assistive device: Walker  Chair/bed transfer   Chair/bed transfer method: Ambulatory Chair/bed transfer assist level: Supervision or verbal cues Chair/bed transfer assistive device: Public librarian transfer assistive device: Grab bar;Walker;Elevated toilet seat/BSC over toilet       Assist level to toilet: Supervision or verbal cues Assist level from toilet: Supervision or verbal cues  Tub/shower transfer   Tub/shower assistive device: Engineer, structural   Assist level into tub: Supervision or verbal cues Assist level out of tub: Supervision or verbal cues   Cognition Comprehension Comprehension assist level: Follows complex conversation/direction with no assist  Expression Expression assist level: Expresses complex ideas: With no assist  Social Interaction Social Interaction assist level: Interacts appropriately with others - No medications needed.  Problem Solving Problem solving assist level: Solves complex problems: Recognizes & self-corrects  Memory Memory assist level: More than reasonable amount of time    Therapy/Group: Individual Therapy  Lewis, Wyat Infinger C 10/05/2014, 8:14 AM

## 2014-10-05 NOTE — Patient Care Conference (Signed)
Inpatient RehabilitationTeam Conference and Plan of Care Update Date: 10/04/2014   Time: 2:15 PM    Patient Name: Sara Russell      Medical Record Number: [id number]  Date of Birth: 16-Dec-1930 Sex: Female         Room/Bed: 4M06C/4M06C-01 Payor Info: Payor: MEDICARE / Plan: MEDICARE PART A AND B / Product Type: *No Product type* /    Admitting Diagnosis: Deconditioned  Admit Date/Time:  09/27/2014  1:25 PM Admission Comments: No comment available   Primary Diagnosis:  Physical debility Principal Problem: Physical debility  Patient Active Problem List   Diagnosis Date Noted  . Physical debility 09/27/2014  . Respiratory distress   . Encounter for palliative care   . ARF (acute renal failure)   . Sepsis   . FUO (fever of unknown origin)   . Liver mass, left lobe   . Septic shock   . AKI (acute kidney injury) 09/15/2014  . SOB (shortness of breath) 09/15/2014  . Elevated troponin   . Chronic diastolic congestive heart failure   . Elevated WBC count   . Urinary tract infectious disease   . Blood poisoning   . Osteopenia of the elderly 05/13/2014  . Abdominal mass of other site 05/13/2014  . Diastolic CHF 23/53/6144  . Gout 08/16/2013  . Hyponatremia 08/10/2013  . Left atrial enlargement 01/10/2013  . A-fib 12/08/2012  . Rosacea 05/15/2012  . Breast cancer screening 03/14/2011  . Anemia due to chronic blood loss 08/28/2010  . Grief 04/29/2010  . DEGENERATIVE DISC DISEASE, LUMBAR SPINE 07/14/2009  . DVT 07/11/2009  . GERD 07/11/2009  . PERSONAL HX COLONIC POLYPS 07/11/2009  . HYPERCHOLESTEROLEMIA 04/10/2006  . HYPERTENSION, BENIGN SYSTEMIC 04/10/2006  . Pulmonary embolism and infarction 04/10/2006  . DIVERTICULOSIS OF COLON 04/10/2006  . ACTINIC KERATOSIS 04/10/2006  . GEN OSTEOARTHROSIS INVOLVING MULTIPLE SITES 04/10/2006    Expected Discharge Date: Expected Discharge Date: 10/07/14  Team Members Present: Physician leading conference: Dr. Alger Simons Social  Worker Present: Lennart Pall, LCSW Nurse Present: Other (comment) Verdis Frederickson, RN) PT Present: Raylene Everts, PT OT Present: Willeen Cass, OT;Other (comment) (Amy Bobby Rumpf, OT) SLP Present: Weston Anna, SLP PPS Coordinator present : Daiva Nakayama, RN, CRRN     Current Status/Progress Goal Weekly Team Focus  Medical   sepsis, deconditioning, renal failure but making urine, cr slowly improving  improve functional activity  skin, renal issues, cv   Bowel/Bladder   continent of bowel and bladder with min assist  remain continent of bowel and bladder with min assist  educate about s/s of constipation    Swallow/Nutrition/ Hydration             ADL's   Supervision overall  Mod I  IADLs (cooking, laundry), functional activity tolerance, UE strengthening.    Mobility   supervision overall  Mod I overall  endurance, gait and balance training, LE strengthening   Communication             Safety/Cognition/ Behavioral Observations            Pain   patient reports no pain  pain less than or equal to 4 on a scale of 0-10  assess pain q4h and prn, medicate if indicated   Skin   Rash tio back (kenolog applied) reddened  pereianal area (nystatin power applied)  no new skin injury/breakdown  assess skin q shift, continue cream/powder as orfered    Rehab Goals Patient on target to meet rehab goals: Yes *See  Care Plan and progress notes for long and short-term goals.  Barriers to Discharge: med mgt, anxiety    Possible Resolutions to Barriers:  intermittent supervision at discahrge----ALF?    Discharge Planning/Teaching Needs:  plan to d/c initially to ALF at [facility] [place] to [place] apt [place]      Team Discussion:  Making good gains;   Renal function slowly improving.  Supervision with rw but pt remains very nervous with mobility.  tx team agrees best for pt to d/c to ALF initialy at [facility]    Revisions to Treatment Plan:  None   Continued Need for  Acute Rehabilitation Level of Care: The patient requires daily medical management by a physician with specialized training in physical medicine and rehabilitation for the following conditions: Daily direction of a multidisciplinary physical rehabilitation program to ensure safe treatment while eliciting the highest outcome that is of practical value to the patient.: Yes Daily medical management of patient stability for increased activity during participation in an intensive rehabilitation regime.: Yes Daily analysis of laboratory values and/or radiology reports with any subsequent need for medication adjustment of medical intervention for : Other  Shevelle Smither 10/05/2014, 12:52 PM

## 2014-10-05 NOTE — Progress Notes (Signed)
Social Work Patient ID: Sara Russell, female   DOB: [birth year], 81 y.o.   MRN: [id number]   Lowella Curb, LCSW Social Worker Signed  Patient Care Conference 10/05/2014 12:52 PM    Expand All Collapse All   Inpatient RehabilitationTeam Conference and Plan of Care Update Date: 10/04/2014   Time: 2:15 PM     Patient Name: Sara Russell       Medical Record Number: [id number]  Date of Birth: 11-Aug-1930 Sex: Female         Room/Bed: 4M06C/4M06C-01 Payor Info: Payor: MEDICARE / Plan: MEDICARE PART A AND B / Product Type: *No Product type* /    Admitting Diagnosis: Deconditioned  Admit Date/Time:  09/27/2014  1:25 PM Admission Comments: No comment available   Primary Diagnosis:  Physical debility Principal Problem: Physical debility    Patient Active Problem List     Diagnosis  Date Noted   .  Physical debility  09/27/2014   .  Respiratory distress     .  Encounter for palliative care     .  ARF (acute renal failure)     .  Sepsis     .  FUO (fever of unknown origin)     .  Liver mass, left lobe     .  Septic shock     .  AKI (acute kidney injury)  09/15/2014   .  SOB (shortness of breath)  09/15/2014   .  Elevated troponin     .  Chronic diastolic congestive heart failure     .  Elevated WBC count     .  Urinary tract infectious disease     .  Blood poisoning     .  Osteopenia of the elderly  05/13/2014   .  Abdominal mass of other site  05/13/2014   .  Diastolic CHF  45/62/5638   .  Gout  08/16/2013   .  Hyponatremia  08/10/2013   .  Left atrial enlargement  01/10/2013   .  A-fib  12/08/2012   .  Rosacea  05/15/2012   .  Breast cancer screening  03/14/2011   .  Anemia due to chronic blood loss  08/28/2010   .  Grief  04/29/2010   .  DEGENERATIVE DISC DISEASE, LUMBAR SPINE  07/14/2009   .  DVT  07/11/2009   .  GERD  07/11/2009   .  PERSONAL HX COLONIC POLYPS  07/11/2009   .  HYPERCHOLESTEROLEMIA  04/10/2006   .  HYPERTENSION, BENIGN SYSTEMIC  04/10/2006   .   Pulmonary embolism and infarction  04/10/2006   .  DIVERTICULOSIS OF COLON  04/10/2006   .  ACTINIC KERATOSIS  04/10/2006   .  GEN OSTEOARTHROSIS INVOLVING MULTIPLE SITES  04/10/2006     Expected Discharge Date: Expected Discharge Date: 10/07/14  Team Members Present: Physician leading conference: Dr. Alger Simons Social Worker Present: Lennart Pall, LCSW Nurse Present: Other (comment) Verdis Frederickson, RN) PT Present: Raylene Everts, PT OT Present: Willeen Cass, OT;Other (comment) Napoleon Form, OT) SLP Present: Weston Anna, SLP PPS Coordinator present : Daiva Nakayama, RN, CRRN        Current Status/Progress  Goal  Weekly Team Focus   Medical     sepsis, deconditioning, renal failure but making urine, cr slowly improving  improve functional activity  skin, renal issues, cv   Bowel/Bladder     continent of bowel and bladder with min assist   remain continent  of bowel and bladder with min assist   educate about s/s of constipation     Swallow/Nutrition/ Hydration               ADL's     Supervision overall  Mod I  IADLs (cooking, laundry), functional activity tolerance, UE strengthening.    Mobility     supervision overall  Mod I overall  endurance, gait and balance training, LE strengthening   Communication               Safety/Cognition/ Behavioral Observations              Pain     patient reports no pain  pain less than or equal to 4 on a scale of 0-10  assess pain q4h and prn, medicate if indicated    Skin     Rash tio back (kenolog applied) reddened  pereianal area (nystatin power applied)  no new skin injury/breakdown  assess skin q shift, continue cream/powder as orfered     Rehab Goals Patient on target to meet rehab goals: Yes *See Care Plan and progress notes for long and short-term goals.    Barriers to Discharge:  med mgt, anxiety     Possible Resolutions to Barriers:   intermittent supervision at discahrge----ALF?     Discharge Planning/Teaching Needs:    plan to d/c initially to ALF at [facility] with plan to [place]       Team Discussion:    Making good gains;   Renal function slowly improving.  Supervision with rw but pt remains very nervous with mobility. tx team agrees best for pt to d/c to ALF initialy at [facility].     Revisions to Treatment Plan:    None    Continued Need for Acute Rehabilitation Level of Care: The patient requires daily medical management by a physician with specialized training in physical medicine and rehabilitation for the following conditions: Daily direction of a multidisciplinary physical rehabilitation program to ensure safe treatment while eliciting the highest outcome that is of practical value to the patient.: Yes Daily medical management of patient stability for increased activity during participation in an intensive rehabilitation regime.: Yes Daily analysis of laboratory values and/or radiology reports with any subsequent need for medication adjustment of medical intervention for : Other  Sara Russell 10/05/2014, 12:52 PM

## 2014-10-05 NOTE — Progress Notes (Signed)
Physical Therapy Session Note  Patient Details  Name: Sara Russell MRN: [id number] Date of Birth: [birth year]  Today's Date: 10/05/2014 PT Individual Time: 1130-1330 PT Individual Time Calculation (min): 120 min   Short Term Goals: Week 1:  PT Short Term Goal 1 (Week 1): =LTG's due to ELOS  Skilled Therapeutic Interventions/Progress Updates:    Pt received seated in w/c with no c/o pain and agreeable to treatment. Pt participated in community reintegration/outing at supervision ambulatory level using RW. Education provided on safety concerns & energy conservation. Performed gait training with RW and supervision, including uneven surfaces as well as up/down standard height curb. Managed 6", 10", and 13" stair getting onto and off of van with modA and cues for problem solving hand placement. Utilized Actuary with supervision and cues for safely managing heavy door. Pt returned to room at completion of session and remained supine in bed with all needs within reach.   Therapy Documentation Precautions:  Precautions Precautions: Fall Precaution Comments: decreased endurance Restrictions Weight Bearing Restrictions: No Pain: Pain Assessment Pain Assessment: No/denies pain Pain Score: 0-No pain   Function:   Transfers Sit to stand transfer   Sit to stand assist level: Supervision or verbal cues Sit to stand assistive device: Walker  Chair/bed transfer   Chair/bed transfer method: Ambulatory Chair/bed transfer assist level: Supervision or verbal cues Chair/bed transfer assistive device: Banker device: Walker-rolling Max distance: 450 Assist level: Supervision or verbal cues  Walk 10 feet activity   Assist level: Supervision or verbal cues  Walk 50 feet with 2 turns activity   Assist level: Supervision or verbal cues  Walk 150 feet activity   Assist level:  Supervision or verbal cues  Walk 10 feet on uneven surfaces activity   Assist level: Supervision or verbal cues  Stairs   Stairs assistive device: 1 hand rail Max number of stairs: 4 (steps onto [employer]) Stairs assist level: Touching or steadying assistance (Pt > 75%)  Walk up/down 1 step activity     Walk up/down 1 step (curb) assist level: Supervision or verbal cues  Walk up/down 4 steps activity   Walk up/down 4 steps assist level: Touching or steadying assistance (Pt > 75%)  Walk up/down 12 steps activity      Pick up small objects from floor      Wheelchair          Wheel 50 feet with 2 turns activity      Wheel 150 feet activity        Therapy/Group: Individual Therapy  Luberta Mutter 10/05/2014, 1:51 PM

## 2014-10-05 NOTE — Progress Notes (Signed)
Social Work Patient ID: Sara Russell, female   DOB: 12/02/1930, 81 y.o.   MRN: [id number]   Have reviewed team conference with pt, son [name]) and SW @ [name] [name]).  All agreed with target d/c on 8/26 with plan to transfer initially to ALF apt and transition to [name] within 30 days.   Will coordinate tx f/u at [name] and continue to follow for support.  Isiaha Greenup, LCSW

## 2014-10-05 NOTE — Progress Notes (Signed)
Physical Therapy Session Note  Patient Details  Name: Sara Russell MRN: [id number] Date of Birth: October 31, 1930  Today's Date: 10/05/2014 PT Individual Time: 0830-0930 PT Individual Time Calculation (min): 60 min   Short Term Goals: Week 1:  PT Short Term Goal 1 (Week 1): =LTG's due to ELOS  Skilled Therapeutic Interventions/Progress Updates:    Pt received up in gym immediately after OT - agreeable to PT session. Community Integration - See below for details. Pt completes ambulation in community setting with RW req SBA on uneven brick ground, practice negotiating a RW through push open and pull open heavy doors, and negotiating an elevator including pressing buttons - all req SBA for safety, and demonstration of how to manage doors with a RW. Gait Training - See below for details. PT instructs pt in ambulation with SPC 150' with 2 turns (to R, then to L) req CGA progressing to SBA - no LOB req assist to correct. During stairs, pt alternates which handrail she prefers halfway up and again halfway down - CGA-min A as needed in step-to pattern.   Pt is continuing to progress with functional mobility. She will be safest d/c with RW, but will benefit from continued PT with focus on progressing towards ambulation with SPC. Continue per PT POC.   Therapy Documentation Precautions:  Precautions Precautions: Fall Precaution Comments: decreased endurance Restrictions Weight Bearing Restrictions: No  Pain: Pain Assessment Pain Assessment: No/denies pain   Function:  Toileting Toileting   Toileting steps completed by patient: Adjust clothing prior to toileting;Performs perineal hygiene;Adjust clothing after toileting   Toileting Assistive Devices: Grab bar or rail Assist level: Supervision or verbal cues   Bed Mobility Roll left and right activity   Assist level: No help, No cues, assistive device, takes more than a reasonable amount of time    Sit to lying activity   Assist level: No  help, No cues, assistive device, takes more than a reasonable amount of time    Lying to sitting activity   Assist level: No help, No cues, assistive device, takes more than a reasonable amount of time Lying to sitting assistive device: Bedrails;HOB elevated  Mobility details     Transfers Sit to stand transfer   Sit to stand assist level: Supervision or verbal cues Sit to stand assistive device: Walker  Chair/bed transfer   Chair/bed transfer method: Ambulatory Chair/bed transfer assist level: Supervision or verbal cues Chair/bed transfer assistive device: Walker   Chair/bed transfer details: Verbal cues for technique   Toilet transfer   Toilet transfer assistive device: Grab bar;Walker;Elevated toilet seat/BSC over toilet   Assist level to toilet: Supervision or verbal cues Assist level from toilet: Supervision or verbal cues      Car transfer          Locomotion Ambulation   Assistive device: Walker-rolling Max distance: 300 Assist level: Supervision or verbal cues  Walk 10 feet activity   Assist level: Supervision or verbal cues  Walk 50 feet with 2 turns activity   Assist level: Supervision or verbal cues  Walk 150 feet activity   Assist level: Supervision or verbal cues  Walk 10 feet on uneven surfaces activity   Assist level: Supervision or verbal cues  Stairs   Stairs assistive device: 1 hand rail;Cane Max number of stairs: 12 Stairs assist level: Touching or steadying assistance (Pt > 75%)  Walk up/down 1 step activity     Walk up/down 1 step (curb) assist level: Touching or steadying assistance (  Pt > 75%)  Walk up/down 4 steps activity   Walk up/down 4 steps assist level: Touching or steadying assistance (Pt > 75%)  Walk up/down 12 steps activity   Walk up/down 12 steps assist level: Touching or steadying assistance (Pt > 75%)  Pick up small objects from floor      Wheelchair          Wheel 50 feet with 2 turns activity      Wheel 150 feet  activity       Cognition Comprehension Comprehension assist level: Follows complex conversation/direction with no assist  Expression Expression assist level: Expresses complex ideas: With no assist  Social Interaction Social Interaction assist level: Interacts appropriately with others - No medications needed.  Problem Solving Problem solving assist level: Solves complex problems: Recognizes & self-corrects  Memory Memory assist level: More than reasonable amount of time    Therapy/Group: Individual Therapy  Sara Russell M 10/05/2014, 9:23 AM

## 2014-10-05 NOTE — Discharge Summary (Signed)
Physician Discharge Summary  Patient ID: Sara Russell MRN: [id number] DOB/AGE: 02-21-1930 81 y.o.  Admit date: 09/27/2014 Discharge date: 10/07/2014  Discharge Diagnoses:  Principal Problem:   Physical debility Active Problems:   Anemia due to chronic blood loss   A-fib   Chronic diastolic congestive heart failure   Septic shock   Liver mass, left lobe   ARF (acute renal failure)   Discharged Condition: Stable.   Significant Diagnostic Studies:  Ct Abdomen Pelvis Wo Contrast  09/18/2014   CLINICAL DATA:  SepsisHx basal and squamous cell ca., diverticulosis, GERDSurgery's; D&C, tonsillectomy, appendectomy, breast lumpectomy(benign)  EXAM: CT ABDOMEN AND PELVIS WITHOUT CONTRAST  TECHNIQUE: Multidetector CT imaging of the abdomen and pelvis was performed following the standard protocol without IV contrast.  COMPARISON:  Pelvic ultrasound, 02/27/2012.  FINDINGS: Lung bases: Small pleural effusions. Lower lobe opacity most consistent with atelectasis. Component of pneumonia is possible but felt likely. 5 mm nodule right lower lobe near the oblique fissure. No pulmonary edema. Heart mildly enlarged. Moderate size hiatal hernia.  Liver: 4.5 cm abnormality, which may reflect a mass, which lies along the posterior margin of the liver at the base caudate lobe and is inseparable from the the inferior vena cava. It also abuts the right adrenal gland. Longitudinal extent of this abnormality is approximately 6 cm.  Liver otherwise unremarkable.  Spleen: Unremarkable.  Gallbladder: Distended. No radiopaque stone. No wall thickening or adjacent inflammation. Common bile duct is dilated to approximate 1 cm but shows normal distal tapering.  Adrenal glands. The left adrenal gland thickening consistent with nodular hyperplasia. Right adrenal gland abuts the above described lesion. Is otherwise unremarkable.  Kidneys, ureters, bladder: 3.2 cm low-density lesion in the midpole of the left kidney consistent with  cysts. No other renal masses. Bilateral renal cortical thinning. No stones. Bilateral perinephric stranding, likely chronic. Left ureter is dilated. There is mild prominence of the left renal pelvis without calyx dilation. No ureteral stone. Normal right ureter. Bladder is decompressed by a Foley catheter.  Uterus and adnexa: Uterus is unremarkable. There is a left adnexal water density mass measuring 6.7 x 6.1 x 7.5 cm. This was present on the prior ultrasound and is unchanged in overall size. This could be ovarian origin. It may reflect an inclusion cyst. It stability since the 2014 ultrasound strongly supports a benign etiology.  Lymph nodes:  No enlarged lymph nodes.  Ascites:  None.  Gastrointestinal: No bowel dilation to suggest obstruction. No bowel wall thickening or mesenteric inflammation.  Musculoskeletal: Degenerative changes most evident at L2-L3. Bony structures are demineralized. No osteoblastic or osteolytic lesions.  IMPRESSION: 1. Heterogeneous masslike abnormality along the posterior margin of the liver, centered on the inferior vena cava, from which it is inseparable. It abuts the right adrenal gland. This may have an origin from the posterior margin of the liver or from the inferior vena cava. Adrenal origin is felt unlikely. This could be a neoplastic mass. This would be better evaluated with a contrast-enhanced CT scan or, if the patient can tolerate, MRI with and without contrast. 2. Small bilateral pleural effusions.  Lower lobe atelectasis. 3. Distended gallbladder without convincing acute cholecystitis. 4. No abdominal abscess. 5. Chronic left adnexal cyst measuring 7.5 cm in greatest dimension, which may be ovarian in origin or an inclusion cyst. It is similar in size to the ultrasound dated 02/27/2012. 6. No acute bowel abnormality.   Electronically Signed   By: Lajean Manes M.D.   On: 09/18/2014 12:15  Labs:  Basic Metabolic Panel:  Recent Labs Lab 10/01/14 0450  10/03/14 1211 10/04/14 0640 10/05/14 0536 10/06/14 0611 10/07/14 0506  NA 135 137 136 136 135 133*  K 3.2* 4.2 4.2 4.1 4.2 4.4  CL 91* 96* 100* 96* 97* 97*  CO2 31 29 28 29 28 27   GLUCOSE 95 119* 98 105* 102* 107*  BUN 59* 61* 59* 61* 62* 63*  CREATININE 3.84* 3.86* 3.72* 3.73* 3.64* 3.62*  CALCIUM 8.3* 9.1 8.7* 8.9 8.8* 8.8*  PHOS 5.3* 4.2 4.6 4.4 4.6 4.6    CBC: CBC Latest Ref Rng 10/07/2014 10/07/2014 09/27/2014  WBC 4.0 - 10.5 K/uL 4.4 3.8(L) 6.5  Hemoglobin 12.0 - 15.0 g/dL 10.0(L) 8.6(L) 10.4(L)  Hematocrit 36.0 - 46.0 % 30.5(L) 26.8(L) 30.8(L)  Platelets 150 - 400 K/uL 214 188 134(L)    Today's Vitals   10/06/14 0941 10/06/14 1401 10/06/14 1714 10/07/14 0545  BP:  121/83  110/76  Pulse:  124 96 98  Temp:  97.9 F (36.6 C)  98 F (36.7 C)  TempSrc:  Oral  Oral  Resp:  20  18  Height:      Weight:    66.3 kg (146 lb 2.6 oz)  SpO2:  100%  99%  PainSc: 0-No pain       CBG: No results for input(s): GLUCAP in the last 168 hours.  Brief HPI:   Sara Russell is a 81 y.o. Female with history of HTN, A fib, PE/DVT systolic CHF who was admitted on 09/15/2014 with increasing SOB, DOE and acute renal failure. She was started on antibiotics due to concerns of UTI. She developed hypotension due to sepsis requiring pressors as well as BIPAP for support. She was noted to have Afib with RVR as well as elevated cardiac enzymes felt to be due to demand ischemia secondary to acute illness and acute renal failure. She developed high fevers despite broad based antibiotics and  MRI of the liver shows a 2.5 cm well circumscribed hyperintense mass located between the intrahepatic IVC and right adrenal gland.  ID consulted for input and felt that patient with sepsis due to  transient bacteremia released from malignancy adjacent to her IVC. Patient declined further workup of liver mass.  Antibiotics were discontinued as all cultures were negative and she was monitored closely. Nephrology has  been following for input on AKI and patient has had good UOP on lasix. Low dose eliquis resumed with recommendations to monitor for renal recovery.   Heart rate controlled and she remained afebrile. Therapy initiated and patient was noted to be deconditioned. CIR was recommended for follow up therapy.     Hospital Course: Sara Russell was admitted to rehab 09/27/2014 for inpatient therapies to consist of PT, ST and OT at least three hours five days a week. Past admission physiatrist, therapy team and rehab RN have worked together to provide customized collaborative inpatient rehab.  Blood pressures have been controlled and she has been afebrile during her rehab stay. Leucocytosis has resolved and she continues on iron supplement due to iron deficiency anemia.  Mood has been stable and endurance levels have greatly improved.  She is voiding without difficulty and urine output has been good. No signs of fluid overload noted. Renal status is slowly improving and patient is to follow up with nephrology after discharge.  Abraded areas due to MASD is improving with decrease in discomfort.  She is tolerating Eliquis without SE and change to warfarin was discussed due  to her renal insuffiencey H/H has been relatively stable and no signs of bleeding noted. Patient declined change to coumadin and prefers to remain on Eliquis due to difficulty managing INR in the past. She pefers to follow up with PMD for input on anticoagulation changes after discharge. She has made good progress during her rehab stay and requires intermittent supervision at discharge. She will continue to receive follow up  HHPT and [place] provided by [facility] after discharge.    Rehab course: During patient's stay in rehab weekly team conferences were held to monitor patient's progress, set goals and discuss barriers to discharge. At admission, patient required min assist with ADL tasks and mobility. She has had improvement in activity tolerance,  balance, postural control, as well as ability to compensate for deficits.  She is modified independent for transfers and mobility.  She is able to complete ADL with supervision to modified independent level.  ALF was recommended with ongoing PT/OT and transition to IL in the next few weeks.     Disposition:  Assisted Living Facility.   Diet: Renal diet. 2000 cc/fluid restriction.   Special Instructions: 1. Contact PMD for any signs of bleeding.  2. Monitor daily weights and contact MD if weight goes up by 3 Lbs overnight or 5 lbs in 2-3 days.      Medication List    STOP taking these medications        traMADol 50 MG tablet  Commonly known as:  ULTRAM      TAKE these medications        acetaminophen 650 MG CR tablet  Commonly known as:  TYLENOL  Take 650 mg by mouth every 8 (eight) hours as needed for pain.     amiodarone 200 MG tablet  Commonly known as:  PACERONE  Take 1 tablet (200 mg total) by mouth daily.     apixaban 2.5 MG Tabs tablet  Commonly known as:  ELIQUIS  Take 1 tablet (2.5 mg total) by mouth 2 (two) times daily.     atorvastatin 10 MG tablet  Commonly known as:  LIPITOR  TAKE ONE TABLET BY MOUTH ONCE DAILY AT  6PM     carvedilol 6.25 MG tablet  Commonly known as:  COREG  Take 1 tablet (6.25 mg total) by mouth 2 (two) times daily with a meal.     feeding supplement (NEPRO CARB STEADY) Liqd  Take 237 mLs by mouth 3 (three) times daily with meals.     ferrous sulfate 325 (65 FE) MG tablet  Take 1 tablet (325 mg total) by mouth daily with breakfast.     furosemide 40 MG tablet  Commonly known as:  LASIX  Take 1 tablet (40 mg total) by mouth daily.     iron polysaccharides 150 MG capsule  Commonly known as:  NIFEREX  Take 1 capsule (150 mg total) by mouth daily at 12 noon.     omeprazole 40 MG capsule  Commonly known as:  PRILOSEC  TAKE ONE CAPSULE BY MOUTH ONCE DAILY     polycarbophil 625 MG tablet  Commonly known as:  FIBERCON  Take 1  tablet (625 mg total) by mouth 2 (two) times daily.     potassium chloride SA 20 MEQ tablet  Commonly known as:  K-DUR,KLOR-CON  Take 2 tablets (40 mEq total) by mouth daily.     saccharomyces boulardii 250 MG capsule  Commonly known as:  FLORASTOR  Take 1 capsule (250 mg total) by mouth 2 (  two) times daily.     SM CALCIUM-VITAMIN D 500-200 MG-UNIT per tablet  Generic drug:  calcium-vitamin D  Take 1 tablet by mouth 2 (two) times daily.       Follow-up Information    Call Meredith Staggers, MD.   Specialty:  Physical Medicine and Rehabilitation   Why:  As needed   Contact information:   [address], [address] [place] [address] [phone]       Follow up with Windy Kalata, MD On 10/27/2014.   Specialty:  Nephrology   Why:  Appointment at 10:45 am for renal follow up.    Contact information:   [place]  [address] [phone]       Follow up with Zigmund Gottron, MD On 10/14/2014.   Specialty:  Family Medicine   Why:  9:30 am   Contact information:   [place] [address] [phone]       Signed: Bary Leriche 10/07/2014, 9:58 AM

## 2014-10-05 NOTE — Progress Notes (Signed)
Recreational Therapy Discharge Summary Patient Details  Name: Sara Russell MRN: [id number] Date of Birth: August 08, 1930 Today's Date: 10/05/2014  Long term goals set: 1  Long term goals met: 1  Comments on progress toward goals: Pt has made good progress during LOS and is ready for discharge to [facility] for continued therapies & intermittent supervision.  Pt participated in community reintegration/outing at supervision ambulatory level using RW.  Education provided on safety concerns & energy conservation. Reasons for discharge: discharge from hospital   Patient/family agrees with progress made and goals achieved: Yes  Hutch Rhett 10/05/2014, 2:27 PM

## 2014-10-06 ENCOUNTER — Inpatient Hospital Stay (HOSPITAL_COMMUNITY): Payer: Medicare Other | Admitting: Occupational Therapy

## 2014-10-06 ENCOUNTER — Inpatient Hospital Stay (HOSPITAL_COMMUNITY): Payer: Medicare Other | Admitting: Physical Therapy

## 2014-10-06 LAB — RENAL FUNCTION PANEL
Albumin: 2.6 g/dL — ABNORMAL LOW (ref 3.5–5.0)
Anion gap: 10 (ref 5–15)
BUN: 62 mg/dL — ABNORMAL HIGH (ref 6–20)
CO2: 28 mmol/L (ref 22–32)
Calcium: 8.8 mg/dL — ABNORMAL LOW (ref 8.9–10.3)
Chloride: 97 mmol/L — ABNORMAL LOW (ref 101–111)
Creatinine, Ser: 3.64 mg/dL — ABNORMAL HIGH (ref 0.44–1.00)
GFR calc Af Amer: 12 mL/min — ABNORMAL LOW (ref >60)
GFR calc non Af Amer: 11 mL/min — ABNORMAL LOW (ref >60)
Glucose, Bld: 102 mg/dL — ABNORMAL HIGH (ref 65–99)
Phosphorus: 4.6 mg/dL (ref 2.5–4.6)
Potassium: 4.2 mmol/L (ref 3.5–5.1)
Sodium: 135 mmol/L (ref 135–145)

## 2014-10-06 MED ORDER — CALCIUM POLYCARBOPHIL 625 MG PO TABS: 625.0000 mg | ORAL_TABLET | Freq: Two times a day (BID) | ORAL | Status: DC

## 2014-10-06 MED ORDER — POLYSACCHARIDE IRON COMPLEX 150 MG PO CAPS: 150.0000 mg | ORAL_CAPSULE | Freq: Every day | ORAL | Status: DC

## 2014-10-06 MED ORDER — FUROSEMIDE 40 MG PO TABS: 40.0000 mg | ORAL_TABLET | Freq: Every day | ORAL | Status: DC

## 2014-10-06 MED ORDER — NEPRO/CARBSTEADY PO LIQD: 237.0000 mL | Freq: Three times a day (TID) | ORAL | Status: DC

## 2014-10-06 MED ORDER — PANTOPRAZOLE SODIUM 40 MG PO TBEC: 40.0000 mg | DELAYED_RELEASE_TABLET | Freq: Every day | ORAL | Status: DC

## 2014-10-06 MED ORDER — SACCHAROMYCES BOULARDII 250 MG PO CAPS: 250.0000 mg | ORAL_CAPSULE | Freq: Two times a day (BID) | ORAL | Status: DC

## 2014-10-06 NOTE — Progress Notes (Addendum)
Physical Therapy Discharge Summary  Patient Details  Name: Sara Russell MRN: [id number] Date of Birth: 1930/09/17  Today's Date: 10/06/2014 PT Individual Time: 0900-1000 PT Individual Time Calculation (min): 60 min    Patient has met 9 of 9 long term goals due to improved activity tolerance, improved balance, improved postural control, increased strength, decreased pain, ability to compensate for deficits, improved attention, improved awareness and improved coordination.  Patient to discharge at an ambulatory level Modified Independent.   Pt lives alone and is d/c to assisted living facility.  Reasons goals not met: All goals met  Recommendation:  Patient will benefit from ongoing skilled PT services in home health setting to continue to advance safe functional mobility, address ongoing impairments in balance, activity tolerance, strength, coordination, postural control, and minimize fall risk.  Equipment: No equipment provided  Reasons for discharge: treatment goals met and discharge from hospital  Patient/family agrees with progress made and goals achieved: Yes  PT Discharge Precautions/Restrictions Precautions Precautions: Fall Precaution Comments: decreased endurance Restrictions Weight Bearing Restrictions: No Vital Signs Therapy Vitals Pulse Rate: 80 BP: 112/68 mmHg Pain Pain Assessment Pain Assessment: No/denies pain Pain Score: 0-No pain Vision/Perception  Perception Comments: WNL  Cognition Overall Cognitive Status: Within Functional Limits for tasks assessed Arousal/Alertness: Awake/alert Orientation Level: Oriented X4 Attention: Alternating Alternating Attention: Appears intact Memory: Appears intact Awareness: Appears intact Problem Solving: Appears intact Safety/Judgment: Appears intact Sensation Sensation Light Touch: Appears Intact Proprioception: Appears Intact Coordination Gross Motor Movements are Fluid and Coordinated: Yes Fine Motor  Movements are Fluid and Coordinated: Yes Motor  Motor Motor: Within Functional Limits Motor - Discharge Observations: Decreased balance and endurance  Mobility Bed Mobility Bed Mobility: Supine to Sit;Sit to Supine;Rolling Right;Rolling Left Rolling Right: 6: Modified independent (Device/Increase time) Rolling Left: 6: Modified independent (Device/Increase time) Supine to Sit: 6: Modified independent (Device/Increase time) Sit to Supine: 6: Modified independent (Device/Increase time) Transfers Transfers: Yes Sit to Stand: 6: Modified independent (Device/Increase time) Stand to Sit: 6: Modified independent (Device/Increase time) Stand Pivot Transfers: 6: Modified independent (Device/Increase time) Locomotion  Ambulation Ambulation: Yes Ambulation/Gait Assistance: 6: Modified independent (Device/Increase time) Ambulation Distance (Feet): 300 Feet Assistive device: Rolling walker Gait Gait: Yes Gait Pattern: Impaired Gait Pattern: Step-through pattern;Decreased stride length;Trunk flexed;Shuffle Gait velocity: slow for age/gender norms High Level Ambulation High Level Ambulation: Head turns Head Turns: mild LOB Stairs / Additional Locomotion Stairs: Yes Stairs Assistance: 5: Supervision Stairs Assistance Details: Verbal cues for precautions/safety;Verbal cues for technique Stair Management Technique: Step to pattern;Two rails Number of Stairs: 12 Height of Stairs: 6 Ramp: 5: Supervision Curb: 5: Building services engineer Mobility: No (Pt amblatory)  Trunk/Postural Assessment  Cervical Assessment Cervical Assessment: Exceptions to [facility] Cervical Strength Overall Cervical Strength Comments: forward head posture Thoracic Assessment Thoracic Assessment: Exceptions to [facility] Thoracic Strength Overall Thoracic Strength Comments: increased kyphosis, rounded shoulder Lumbar Assessment Lumbar Assessment: Exceptions to [facility] (decreased lumbar lordosis) Postural  Control Postural Control: Deficits on evaluation Postural Limitations: Pt with increased reliance on UEs for balance during all mobility, slow power production for stepping strategies  Balance Balance Balance Assessed: Yes Standardized Balance Assessment Standardized Balance Assessment: Dynamic Gait Index Dynamic Gait Index Level Surface: Normal Change in Gait Speed: Mild Impairment Gait with Horizontal Head Turns: Mild Impairment Gait with Vertical Head Turns: Mild Impairment Gait and Pivot Turn: Mild Impairment Step Over Obstacle: Mild Impairment Step Around Obstacles: Mild Impairment Steps: Mild Impairment Total Score: 17 Static Sitting Balance Static Sitting - Level of Assistance:  7: Independent Static Standing Balance Static Standing - Balance Support: During functional activity;Bilateral upper extremity supported Static Standing - Level of Assistance: 6: Modified independent (Device/Increase time) Dynamic Standing Balance Dynamic Standing - Balance Support: During functional activity Dynamic Standing - Level of Assistance: 6: Modified independent (Device/Increase time) Extremity Assessment  RUE Assessment RUE Assessment: Within Functional Limits LUE Assessment LUE Assessment: Within Functional Limits RLE Assessment RLE Assessment: Within Functional Limits (grossly WFL, hip flexion 4+/5) LLE Assessment LLE Assessment: Within Functional Limits (grossly WFL, hip flex 4+/5)  Function:  Herbalist Devices: Grab bar or rail   Bed Mobility Roll left and right activity   Assist level: No help, No cues, assistive device, takes more than a reasonable amount of time  Sit to lying activity   Assist level: No help, No cues, assistive device, takes more than a reasonable amount of time  Lying to sitting activity   Assist level: No help, No cues, assistive device, takes more than a reasonable amount of time  Mobility details     Transfers Sit  to stand transfer   Sit to stand assist level: No help, no cues, assistive device, takes more than a reasonable amount of time Sit to stand assistive device: Walker  Chair/bed transfer   Chair/bed transfer method: Ambulatory Chair/bed transfer assist level: No Help, no cues, assistive device, takes more than a reasonable amount of time Chair/bed transfer assistive device: Counselling psychologist transfer assistive device: Grab bar;Walker;Elevated toilet seat/BSC over Hospital doctor transfer assistive device: Publishing rights manager transfer assist level: No help, no cues, assistive device, takes more than a reasonable amount of time    Locomotion Ambulation   Assistive device: Walker-rolling Max distance: 300 Assist level: No help, No cues, assistive device, takes more than a reasonable amount of time  Walk 10 feet activity   Assist level: No help, No cues, assistive device, takes more than a reasonable amount of time  Walk 50 feet with 2 turns activity   Assist level: No help, No cues, assistive device, takes more than a reasonable amount of time  Walk 150 feet activity   Assist level: No help, No cues, assistive device, takes more than a reasonable amount of time  Walk 10 feet on uneven surfaces activity   Assist level: Supervision or verbal cues  Stairs   Stairs assistive device: 2 hand rails Max number of stairs: 12 Stairs assist level: No help, no cues, assistive device, takes more than a reasonable amount of time  Walk up/down 1 step activity     Walk up/down 1 step (curb) assist level: Supervision or verbal cues  Walk up/down 4 steps activity   Walk up/down 4 steps assist level: Supervision or verbal cues  Walk up/down 12 steps activity   Walk up/down 12 steps assist level: Supervision or verbal cues  Pick up small objects from floor   Assist level: Touching or steadying assistance (Pt > 75%)  Wheelchair          Wheel 50 feet with 2 turns activity       Wheel 150 feet activity       Cognition Comprehension Comprehension assist level: Follows complex conversation/direction with no assist  Expression Expression assist level: Expresses complex ideas: With no assist  Social Interaction Social Interaction assist level: Interacts appropriately with others - No medications needed.  Problem Solving Problem solving assist  level: Solves complex problems: Recognizes & self-corrects  Memory Memory assist level: More than reasonable amount of time    Skilled Therapeutic Intervention: Pt received in w/c with no c/o pain and agreeable to treatment. All mobility assessed as described above in anticipation of d/c to ALF tomorrow. Additional gait training provided with cane x100' in hallway before pt reports fatigue and requires seated rest break. Ambulation in room with [employer] and opposite UE carrying cup of water to feed plants, with supervision. Pt remained seated in w/c at completion of session and all needs within reach. Pt made mod I in room, sign hung and RN alerted.   Benjiman Core Tygielski 10/06/2014, 10:11 AM

## 2014-10-06 NOTE — Progress Notes (Signed)
Occupational Therapy Session Note  Patient Details  Name: Sara Russell MRN: [id number] Date of Birth: 30-Oct-1930  Today's Date: 10/06/2014 OT Individual Time: 1300-1330 and 1100-1200 and 1300-1330 OT Individual Time Calculation (min): 30 min and 60 min and 30 min   Short Term Goals: Week 1:  OT Short Term Goal 1 (Week 1): STG=LTG due to ELOS  Skilled Therapeutic Interventions/Progress Updates:    Session One: Pt seen for OT ADL session focusing on functional mobility, ADL re-training, and d/c education/ planning. Pt sitting EOB upon arrival with RN present administering morning meds. PT declined full bathing task until later session. She ambulated throughout room using RW to gather clothing, and complete toileting task at overall supervision- mod I level.  Pt ambulated throughout unit using SPC. Asked to find various items along hallway walls to facilitate upright standing posture and divided attention while ambulating. Able to complete with supervision and min VCs for posture. She returned to room at end of session, left sitting in w/c with all needs in reach.  Education provided throughout session regarding energy conservation, fall risk, AE/DME recommendations, and d/c planning.   Session Two: Pt Seen for OT session focusing on UE strengthening and establishment of HEP. Pt ambulated to therapy gym with RW mod I. Pt provided with hand out with UE theraband exercises. Pt completed exercises with verbal, tactile, and demonstrational cuing. Exercises modified based on pt's ability to complete exercises. Pt noted "old pain" in R UE, kinesiotape applied, and exercises modified for comfort. Therapist stressed to pt importance of quality of exercises vs. Quantity of exercises and to take rest breaks as needed. 2 exercises completed in standing with close supervision, recommend pt complete UE strengthening exercises in sitting only upon d/c. Pt ambulated back to room at end of session, left sitting in  w/c with all needs in reach.   Session Three: Pt seen for OT ADL bathing and dressing session. Pt sitting up in w/c upon arrival, agreeable to tx session. She voiced desire to complete bathing task. Bathing completed at shower level, See below for required amount of assist. VCs provided for RW management during functional tasks, and education provided regarding reducing fall risks upon d/c, proper body mechanics to ease sit <> stand transfers, and home modifications to reduce fall risk. Pt left sitting in w/c at end of session, all questions answered, and pt voicing feeling ready for tomorrow's d/c.   Therapy Documentation Precautions:  Precautions Precautions: Fall Precaution Comments: decreased endurance Restrictions Weight Bearing Restrictions: No Pain:  No/denies Pain  Function:   Grooming Oral Care,Brush Teeth, Clean Dentures Activity:      Assist Level: More than reasonable amount of time      Wash, Rinse, Dry Face Activity   Assist Level: More than reasonable time      Wash, Rinse, Dry Hands Activity   Assist Level: Assistive device      Brush, Comb Hair Activity   Assist Level: No help, No cues    Shave Activity Shave activity did not occur: Patient does not shave        Apply Makeup Activity Apply makeup activity did not occur: Patient does not wear makeup  Bathing Bathing position   Position: Shower  Bathing parts Body parts bathed by patient: Right arm;Right upper leg;Left arm;Left upper leg;Abdomen;Chest;Right lower leg;Front perineal area;Left lower leg;Buttocks;Back    Bathing assist Assist Level: More than reasonable time       Upper Body Dressing/Undressing Upper body dressing   What is the patient wearing?: Pull over shirt/dress;Bra Bra - Perfomed by patient: Thread/unthread right bra strap;Thread/unthread left bra strap;Hook/unhook bra (pull down sports bra)   Pull over shirt/dress -  Perfomed by patient: Thread/unthread right sleeve;Thread/unthread left sleeve;Put head through opening;Pull shirt over trunk          Upper body assist Assist Level: No help, No cues       Lower Body Dressing/Undressing Lower body dressing   What is the patient wearing?: Underwear;Pants;Socks;Shoes Underwear - Performed by patient: Pull underwear up/down;Thread/unthread right underwear leg;Thread/unthread left underwear leg   Pants- Performed by patient: Thread/unthread right pants leg;Thread/unthread left pants leg;Pull pants up/down;Fasten/unfasten pants       Socks - Performed by patient: Don/doff right sock;Don/doff left sock   Shoes - Performed by patient: Don/doff left shoe;Don/doff right shoe;Fasten right;Fasten left            Lower body assist Assist Level: More than reasonable time;Assistive device Assistive Device Comment: Sit <> stand from EOB using RW to pull pants up/ down     Toileting Toileting   Toileting steps completed by patient: Adjust clothing prior to toileting;Performs perineal hygiene;Adjust clothing after toileting   Toileting Assistive Devices: Grab bar or rail  Toileting assist Assist level: More than reasonable time    Bed Mobility Roll left and right activity   Assist level: No help, No cues, assistive device, takes more than a reasonable amount of time  Sit to lying activity   Assist level: No help, No cues, assistive device, takes more than a reasonable amount of time  Lying to sitting activity   Assist level: No help, No cues, assistive device, takes more than a reasonable amount of time  Mobility details     Transfers Sit to stand transfer   Sit to stand assist level: No help, no cues, assistive device, takes more than a reasonable amount of time Sit to stand assistive device: Walker  Chair/bed transfer   Chair/bed transfer method: Ambulatory Chair/bed transfer assist level: No Help, no cues, assistive device, takes more than a  reasonable amount of time Chair/bed transfer assistive device: Public librarian transfer assistive device: Grab bar;Walker;Elevated toilet seat/BSC over toilet       Assist level to toilet: No Help, no cues, assistive device, takes more than a reasonable amount of time Assist level from toilet: No Help, no cues, assistive device, takes more than a reasonable amount of time  Tub/shower transfer   Tub/shower assistive device: Engineer, structural   Assist level into tub: Supervision or verbal cues Assist level out of tub: Supervision or verbal cues   Cognition Comprehension    Expression Expression assist level: Expresses complex ideas: With no assist  Social Interaction Social Interaction assist level: Interacts appropriately with others - No medications needed.  Problem Solving Problem solving assist level: Solves complex problems: Recognizes & self-corrects  Memory Memory assist level: More than reasonable amount of time    Therapy/Group: Individual Therapy  Lewis, Blake Goya C 10/06/2014, 3:29 PM

## 2014-10-06 NOTE — Progress Notes (Signed)
[facility] PHYSICAL MEDICINE & REHABILITATION     PROGRESS NOTE    Subjective/Complaints: Feeling well. Outing yesterday without any issues.     ROS: Pt denies fever, rash/itching, headache,, nausea, vomiting, abdominal pain, chest pain, shortness of breath, palpitations, dysuria, dizziness,    Objective: Vital Signs: Blood pressure 112/68, pulse 80, temperature 97.9 F (36.6 C), temperature source Oral, resp. rate 16, height 5' 1"  (1.549 m), weight 65.273 kg (143 lb 14.4 oz), SpO2 98 %. No results found. No results for input(s): WBC, HGB, HCT, PLT in the last 72 hours.  Recent Labs  10/05/14 0536 10/06/14 0611  NA 136 135  K 4.1 4.2  CL 96* 97*  GLUCOSE 105* 102*  BUN 61* 62*  CREATININE 3.73* 3.64*  CALCIUM 8.9 8.8*   CBG (last 3)  No results for input(s): GLUCAP in the last 72 hours.  Wt Readings from Last 3 Encounters:  10/06/14 65.273 kg (143 lb 14.4 oz)  09/27/14 71.804 kg (158 lb 4.8 oz)  05/16/14 70.217 kg (154 lb 12.8 oz)    Physical Exam:  Constitutional: She is oriented to person, place, and time.  HENT: oral mucosa pink and moist Head: Normocephalic.  Eyes: EOM are normal.  Neck: Normal range of motion. Neck supple. No thyromegaly present.  Cardiovascular:  Cardiac rate controlled, irregularly irregular  Respiratory: Effort normal and breath sounds normal-improved air movement.  No respiratory distress.  GI: Soft. Bowel sounds are normal. She exhibits no distension. There is no tenderness.  Neurological: She is alert and oriented to person, place, and time.  Motor strength is 4/5 bilateral deltoids, biceps, triceps, grip, hip flexor, knee extensor, ankle dorsiflexor and plantar flexor, DTR's 2+ Sensation is intact to light touch in bilateral upper and lower limbs Mood and affect are appropriate There is no evidence of dysarthria, good phonation. Coordination appears normal  Good insight and awareness.  Skin: intact, warm---mild  redness/excoriation around right buttock -improving Psych: pleasant and appropriate  Assessment/Plan: 1. Functional deficits secondary to debility after multiple medical which require 3+ hours per day of interdisciplinary therapy in a comprehensive inpatient rehab setting. Physiatrist is providing close team supervision and 24 hour management of active medical problems listed below. Physiatrist and rehab team continue to assess barriers to discharge/monitor patient progress toward functional and medical goals.  Function:  Bathing Bathing position   Position: Shower  Bathing parts Body parts bathed by patient: Right arm, Right upper leg, Left arm, Left upper leg, Abdomen, Chest, Right lower leg, Front perineal area, Left lower leg, Buttocks, Back    Bathing assist Assist Level: Set up (Completed in sitting)   Set up : To obtain items  Upper Body Dressing/Undressing Upper body dressing   What is the patient wearing?: Pull over shirt/dress, Bra Bra - Perfomed by patient: Thread/unthread right bra strap, Thread/unthread left bra strap, Hook/unhook bra (pull down sports bra)   Pull over shirt/dress - Perfomed by patient: Thread/unthread right sleeve, Thread/unthread left sleeve, Put head through opening, Pull shirt over trunk          Upper body assist Assist Level: More than reasonable time      Lower Body Dressing/Undressing Lower body dressing   What is the patient wearing?: Underwear, Pants, Socks, Shoes Underwear - Performed by patient: Pull underwear up/down, Thread/unthread right underwear leg, Thread/unthread left underwear leg Underwear - Performed by helper: Thread/unthread right underwear leg, Thread/unthread left underwear leg (due to putting over shoes PT assisted pt) Pants- Performed by patient: Thread/unthread  right pants leg, Thread/unthread left pants leg, Pull pants up/down, Fasten/unfasten pants Pants- Performed by helper: Thread/unthread right pants leg Non-skid  slipper socks- Performed by patient: Don/doff right sock, Don/doff left sock   Socks - Performed by patient: Don/doff right sock, Don/doff left sock   Shoes - Performed by patient: Don/doff left shoe, Don/doff right shoe            Lower body assist Assist Level: Supervision or verbal cues   Set up : To obtain clothing/put away  Toileting Toileting   Toileting steps completed by patient: Adjust clothing prior to toileting, Performs perineal hygiene, Adjust clothing after toileting Toileting steps completed by helper: Performs perineal hygiene (pt has a hemmorhoid,assisted to get pt clean) Toileting Assistive Devices: Grab bar or rail  Toileting assist Assist level: More than reasonable time   Transfers Chair/bed transfer   Chair/bed transfer method: Ambulatory Chair/bed transfer assist level: No Help, no cues, assistive device, takes more than a reasonable amount of time Chair/bed transfer assistive device: Careers adviser device: Walker-rolling Max distance: 450 Assist level: Supervision or Psychologist, clinical activity did not occur:  (gait to/from therapy) Type: Manual Max wheelchair distance: 100 Assist Level: Dependent (Pt equals 0%)  Cognition Comprehension Comprehension assist level: Follows complex conversation/direction with no assist  Expression Expression assist level: Expresses complex ideas: With no assist  Social Interaction Social Interaction assist level: Interacts appropriately with others - No medications needed.  Problem Solving Problem solving assist level: Solves complex problems: Recognizes & self-corrects  Memory Memory assist level: More than reasonable amount of time   Medical Problem List and Plan: 1. Functional deficits secondary to debilitation related to sepsis with acute renal failure: making functional progress  -transition to ALF and then eventually to independent living apt at [facility]  8/26  -mod I in the room today 2. DVT Prophylaxis/Anticoagulation: eliquis 3. Pain Management: under control 4. Acute renal failure secondary to ischemic ATN.  -Cr down to 3.64 today.  I personally reviewed the patient's labs today.   -lasix diuresis per nephrology--they signed off-- they will follow up with her as an outpt if Cr not back at baseline before discharge--pt to call for appt..  -making urine 5. Neuropsych: This patient is capable of making decisions on her own behalf. 6. Skin/Wound Care:  Fungal powder, oob, pressure relief to buttocks 7. Fluids/Electrolytes/Nutrition: encourage PO, 50-100% meals, ~622m fluid 8. ID/sepsis/liver mass. All antibiotics currently discontinued  9. Hypertension/atrial fibrillation. Continue amiodarone 200 mg daily, Coreg 3.125 mg twice a day.BP 119/64 this am    10. Systolic congestive heart failure: edema resolved  -lasix 457mqd--dc home on this.  -weight 65kg 11. Hyperlipidemia. Lipitor 12. Loose stool: fiber and probiotic added  -encourage po 13.  Hypokalemia---K+ supplement  -potassium normal on labs today  .   LOS (Days) 9 A FACE TO FACE EVALUATION WAS PERFORMED  SWARTZ,ZACHARY T 10/06/2014 8:06 AM

## 2014-10-07 ENCOUNTER — Inpatient Hospital Stay (HOSPITAL_COMMUNITY): Payer: Medicare Other | Admitting: Occupational Therapy

## 2014-10-07 ENCOUNTER — Encounter: Payer: Self-pay | Admitting: Family Medicine

## 2014-10-07 ENCOUNTER — Telehealth: Payer: Self-pay | Admitting: Family Medicine

## 2014-10-07 DIAGNOSIS — D500 Iron deficiency anemia secondary to blood loss (chronic): Secondary | ICD-10-CM

## 2014-10-07 LAB — CBC WITH DIFFERENTIAL/PLATELET
Basophils Absolute: 0.1 10*3/uL (ref 0.0–0.1)
Basophils Relative: 2 % — ABNORMAL HIGH (ref 0–1)
Eosinophils Absolute: 0.1 10*3/uL (ref 0.0–0.7)
Eosinophils Relative: 3 % (ref 0–5)
HCT: 30.5 % — ABNORMAL LOW (ref 36.0–46.0)
Hemoglobin: 10 g/dL — ABNORMAL LOW (ref 12.0–15.0)
Lymphocytes Relative: 13 % (ref 12–46)
Lymphs Abs: 0.6 10*3/uL — ABNORMAL LOW (ref 0.7–4.0)
MCH: 29.9 pg (ref 26.0–34.0)
MCHC: 32.8 g/dL (ref 30.0–36.0)
MCV: 91.3 fL (ref 78.0–100.0)
Monocytes Absolute: 0.5 10*3/uL (ref 0.1–1.0)
Monocytes Relative: 11 % (ref 3–12)
Neutro Abs: 3.2 10*3/uL (ref 1.7–7.7)
Neutrophils Relative %: 71 % (ref 43–77)
Platelets: 214 10*3/uL (ref 150–400)
RBC: 3.34 MIL/uL — ABNORMAL LOW (ref 3.87–5.11)
RDW: 15.6 % — ABNORMAL HIGH (ref 11.5–15.5)
WBC: 4.4 10*3/uL (ref 4.0–10.5)

## 2014-10-07 LAB — RENAL FUNCTION PANEL
Albumin: 2.6 g/dL — ABNORMAL LOW (ref 3.5–5.0)
Anion gap: 9 (ref 5–15)
BUN: 63 mg/dL — ABNORMAL HIGH (ref 6–20)
CO2: 27 mmol/L (ref 22–32)
Calcium: 8.8 mg/dL — ABNORMAL LOW (ref 8.9–10.3)
Chloride: 97 mmol/L — ABNORMAL LOW (ref 101–111)
Creatinine, Ser: 3.62 mg/dL — ABNORMAL HIGH (ref 0.44–1.00)
GFR calc Af Amer: 12 mL/min — ABNORMAL LOW (ref >60)
GFR calc non Af Amer: 11 mL/min — ABNORMAL LOW (ref >60)
Glucose, Bld: 107 mg/dL — ABNORMAL HIGH (ref 65–99)
Phosphorus: 4.6 mg/dL (ref 2.5–4.6)
Potassium: 4.4 mmol/L (ref 3.5–5.1)
Sodium: 133 mmol/L — ABNORMAL LOW (ref 135–145)

## 2014-10-07 LAB — CBC
HCT: 26.8 % — ABNORMAL LOW (ref 36.0–46.0)
Hemoglobin: 8.6 g/dL — ABNORMAL LOW (ref 12.0–15.0)
MCH: 29.1 pg (ref 26.0–34.0)
MCHC: 32.1 g/dL (ref 30.0–36.0)
MCV: 90.5 fL (ref 78.0–100.0)
Platelets: 188 10*3/uL (ref 150–400)
RBC: 2.96 MIL/uL — ABNORMAL LOW (ref 3.87–5.11)
RDW: 15.8 % — ABNORMAL HIGH (ref 11.5–15.5)
WBC: 3.8 10*3/uL — ABNORMAL LOW (ref 4.0–10.5)

## 2014-10-07 NOTE — Progress Notes (Signed)
Social Work  Discharge Note  The overall goal for the admission was met for:   Discharge location: Yes - d/c'd to ALF at [facility]   Length of Stay: Yes - 10 days  Discharge activity level: Yes - modified independent  Home/community participation: Yes  Services provided included: MD, RD, PT, OT, SLP, RN, TR, Pharmacy and SW  Financial Services: Medicare and Private Insurance: [place]  Follow-up services arranged: Home Health: PT, OT provided by [employer], Other: [facility] and Patient/Family has no preference for HH/DME agencies  Comments (or additional information):  Patient/Family verbalized understanding of follow-up arrangements: Yes  Individual responsible for coordination of the follow-up plan: pt  Confirmed correct DME delivered: NA    Sara Russell

## 2014-10-07 NOTE — Progress Notes (Addendum)
[place] PHYSICAL MEDICINE & REHABILITATION     PROGRESS NOTE    Subjective/Complaints: Feeling well. Excited to be going home. No complaints.     ROS: Pt denies fever, rash/itching, headache,, nausea, vomiting, abdominal pain, chest pain, shortness of breath, palpitations, dysuria, dizziness,    Objective: Vital Signs: Blood pressure 110/76, pulse 98, temperature 98 F (36.7 C), temperature source Oral, resp. rate 18, height 5' 1"  (1.549 m), weight 66.3 kg (146 lb 2.6 oz), SpO2 99 %. No results found.  Recent Labs  10/07/14 0506  WBC 3.8*  HGB 8.6*  HCT 26.8*  PLT 188    Recent Labs  10/06/14 0611 10/07/14 0506  NA 135 133*  K 4.2 4.4  CL 97* 97*  GLUCOSE 102* 107*  BUN 62* 63*  CREATININE 3.64* 3.62*  CALCIUM 8.8* 8.8*   CBG (last 3)  No results for input(s): GLUCAP in the last 72 hours.  Wt Readings from Last 3 Encounters:  10/07/14 66.3 kg (146 lb 2.6 oz)  09/27/14 71.804 kg (158 lb 4.8 oz)  05/16/14 70.217 kg (154 lb 12.8 oz)    Physical Exam:  Constitutional: She is oriented to person, place, and time.  HENT: oral mucosa pink and moist Head: Normocephalic.  Eyes: EOM are normal.  Neck: Normal range of motion. Neck supple. No thyromegaly present.  Cardiovascular:  Cardiac rate controlled, irregularly irregular  Respiratory: Effort normal and breath sounds normal-improved air movement.  No respiratory distress.  GI: Soft. Bowel sounds are normal. She exhibits no distension. There is no tenderness.  Neurological: She is alert and oriented to person, place, and time.  Motor strength is 4/5 bilateral deltoids, biceps, triceps, grip, hip flexor, knee extensor, ankle dorsiflexor and plantar flexor, DTR's 2+ Sensation is intact to light touch in bilateral upper and lower limbs Mood and affect are appropriate There is no evidence of dysarthria, good phonation. Coordination appears normal  Good insight and awareness.  Skin: intact,  warm---mild redness/excoriation around right buttock -improving Psych: pleasant and appropriate  Assessment/Plan: 1. Functional deficits secondary to debility after multiple medical which require 3+ hours per day of interdisciplinary therapy in a comprehensive inpatient rehab setting. Physiatrist is providing close team supervision and 24 hour management of active medical problems listed below. Physiatrist and rehab team continue to assess barriers to discharge/monitor patient progress toward functional and medical goals.  Function:  Bathing Bathing position   Position: Shower  Bathing parts Body parts bathed by patient: Right arm, Right upper leg, Left arm, Left upper leg, Abdomen, Chest, Right lower leg, Front perineal area, Left lower leg, Buttocks, Back    Bathing assist Assist Level: More than reasonable time   Set up : To obtain items  Upper Body Dressing/Undressing Upper body dressing   What is the patient wearing?: Pull over shirt/dress, Bra Bra - Perfomed by patient: Thread/unthread right bra strap, Thread/unthread left bra strap, Hook/unhook bra (pull down sports bra)   Pull over shirt/dress - Perfomed by patient: Thread/unthread right sleeve, Thread/unthread left sleeve, Put head through opening, Pull shirt over trunk          Upper body assist Assist Level: No help, No cues      Lower Body Dressing/Undressing Lower body dressing   What is the patient wearing?: Underwear, Pants, Socks, Shoes Underwear - Performed by patient: Pull underwear up/down, Thread/unthread right underwear leg, Thread/unthread left underwear leg Underwear - Performed by helper: Thread/unthread right underwear leg, Thread/unthread left underwear leg (due to putting over shoes  PT assisted pt) Pants- Performed by patient: Thread/unthread right pants leg, Thread/unthread left pants leg, Pull pants up/down, Fasten/unfasten pants Pants- Performed by helper: Thread/unthread right pants leg Non-skid  slipper socks- Performed by patient: Don/doff right sock, Don/doff left sock   Socks - Performed by patient: Don/doff right sock, Don/doff left sock   Shoes - Performed by patient: Don/doff left shoe, Don/doff right shoe, Fasten right, Fasten left            Lower body assist Assist Level: More than reasonable time, Assistive device Assistive Device Comment: Sit <> stand from EOB using RW to pull pants up/ down Set up : To obtain clothing/put away  Toileting Toileting   Toileting steps completed by patient: Adjust clothing prior to toileting, Performs perineal hygiene, Adjust clothing after toileting Toileting steps completed by helper: Performs perineal hygiene (pt has a hemmorhoid,assisted to get pt clean) Toileting Assistive Devices: Grab bar or rail  Toileting assist Assist level: More than reasonable time   Transfers Chair/bed transfer   Chair/bed transfer method: Ambulatory Chair/bed transfer assist level: No Help, no cues, assistive device, takes more than a reasonable amount of time Chair/bed transfer assistive device: Careers adviser device: Walker-rolling Max distance: 300 Assist level: No help, No cues, assistive device, takes more than a reasonable amount of time   Wheelchair Wheelchair activity did not occur:  (gait to/from therapy) Type: Manual Max wheelchair distance: 100 Assist Level: Dependent (Pt equals 0%)  Cognition Comprehension Comprehension assist level: Follows complex conversation/direction with no assist  Expression Expression assist level: Expresses complex ideas: With no assist  Social Interaction Social Interaction assist level: Interacts appropriately with others - No medications needed.  Problem Solving Problem solving assist level: Solves complex problems: Recognizes & self-corrects  Memory Memory assist level: More than reasonable amount of time   Medical Problem List and Plan: 1. Functional deficits secondary  to debilitation related to sepsis with acute renal failure: making functional progress  -transition to ALF and then eventually to independent living apt at [facility]---  -dc today 2. DVT Prophylaxis/Anticoagulation: eliquis---hgb with further drop today---re-check to confirm  -is this eliquis related?  dw pharmacy 3. Pain Management: under control 4. Acute renal failure secondary to ischemic ATN.  -Cr holding at 3.6.  I personally reviewed the patient's labs today.   -lasix diuresis per nephrology--they signed off-- they will follow up with her as an outpt if Cr not back at baseline before discharge--pt to call for appt..  -making urine 5. Neuropsych: This patient is capable of making decisions on her own behalf. 6. Skin/Wound Care:  Fungal powder, oob, pressure relief to buttocks 7. Fluids/Electrolytes/Nutrition: encourage PO, 50-100% meals, ~610m fluid 8. ID/sepsis/liver mass. All antibiotics currently discontinued  9. Hypertension/atrial fibrillation. Continue amiodarone 200 mg daily, Coreg 3.125 mg twice a day.BP 119/64 this am    10. Systolic congestive heart failure: edema resolved  -lasix 423mqd--dc home on this.  -weight 65kg 11. Hyperlipidemia. Lipitor 12. Loose stool: fiber and probiotic added  -encourage po 13.  Hypokalemia---K+ supplement  -potassium normal    .   LOS (Days) 10 A FACE TO FACE EVALUATION WAS PERFORMED  Zacharey Jensen T 10/07/2014 8:24 AM

## 2014-10-07 NOTE — Telephone Encounter (Signed)
Registered nurse from [facility] called to request verification of orders for patient.  Patient was being transported and admitted to facility today.  Informed her that Dr. Andria Frames is out of the office today, however I would have another provider to review the orders.  Precept with Dr. McDiarmid; will review the orders.  Please fax back to number provided once reviewed.  Derl Barrow, RN

## 2014-10-07 NOTE — Progress Notes (Signed)
Patient ID: Sara Russell, female   DOB: 09-04-1930, 81 y.o.   MRN: [id number] I confirmed orders for patient's admission to [facility].  Patient was discharged from Dr Candise Che PM&R service to ALF on 10/07/14 after 10 day stay.  Dr Andria Frames is patient's PCP.  He is not available at this time.  These medications were confirmed for the ALF Current Outpatient Prescriptions on File Prior to Visit  Medication Sig Dispense Refill  . acetaminophen (TYLENOL) 650 MG CR tablet Take 650 mg by mouth every 8 (eight) hours as needed for pain.    Marland Kitchen amiodarone (PACERONE) 200 MG tablet Take 1 tablet (200 mg total) by mouth daily. 30 tablet 0  . apixaban (ELIQUIS) 2.5 MG TABS tablet Take 1 tablet (2.5 mg total) by mouth 2 (two) times daily. 60 tablet 0  . atorvastatin (LIPITOR) 10 MG tablet TAKE ONE TABLET BY MOUTH ONCE DAILY AT  6PM 90 tablet 3  . calcium-vitamin D (SM CALCIUM-VITAMIN D) 500-200 MG-UNIT per tablet Take 1 tablet by mouth 2 (two) times daily.     . carvedilol (COREG) 6.25 MG tablet Take 1 tablet (6.25 mg total) by mouth 2 (two) times daily with a meal. 60 tablet 0  . ferrous sulfate 325 (65 FE) MG tablet Take 1 tablet (325 mg total) by mouth daily with breakfast. 1 tablet 0  . furosemide (LASIX) 40 MG tablet Take 1 tablet (40 mg total) by mouth daily. 30 tablet 0  . Nutritional Supplements (FEEDING SUPPLEMENT, NEPRO CARB STEADY,) LIQD Take 237 mLs by mouth 3 (three) times daily with meals.  0  . omeprazole (PRILOSEC) 40 MG capsule TAKE ONE CAPSULE BY MOUTH ONCE DAILY 90 capsule 3  . polycarbophil (FIBERCON) 625 MG tablet Take 1 tablet (625 mg total) by mouth 2 (two) times daily.    . potassium chloride SA (K-DUR,KLOR-CON) 20 MEQ tablet Take 2 tablets (40 mEq total) by mouth daily. 30 tablet 0  . saccharomyces boulardii (FLORASTOR) 250 MG capsule Take 1 capsule (250 mg total) by mouth 2 (two) times daily.    . [DISCONTINUED] omeprazole (PRILOSEC OTC) 20 MG tablet Take 20 mg  by mouth 2 (two) times daily.

## 2014-10-07 NOTE — Telephone Encounter (Signed)
Orders reviewed by Dr. Wendy Poet and faxed back to [facility].  Derl Barrow, RN

## 2014-10-07 NOTE — Telephone Encounter (Signed)
Need to have nurse call back to verify the orders they received for patient's admission to the facility.

## 2014-10-07 NOTE — Progress Notes (Signed)
Patient received discharge instructions from Algis Liming, PA-C with verbal understanding. Patient discharged to [facility]. Patient transported to [facility] via patient's son along with belongings.

## 2014-10-08 DIAGNOSIS — R2681 Unsteadiness on feet: Secondary | ICD-10-CM | POA: Diagnosis not present

## 2014-10-08 DIAGNOSIS — R54 Age-related physical debility: Secondary | ICD-10-CM | POA: Diagnosis not present

## 2014-10-08 DIAGNOSIS — M6281 Muscle weakness (generalized): Secondary | ICD-10-CM | POA: Diagnosis not present

## 2014-10-08 DIAGNOSIS — R278 Other lack of coordination: Secondary | ICD-10-CM | POA: Diagnosis not present

## 2014-10-10 DIAGNOSIS — R54 Age-related physical debility: Secondary | ICD-10-CM | POA: Diagnosis not present

## 2014-10-10 DIAGNOSIS — M6281 Muscle weakness (generalized): Secondary | ICD-10-CM | POA: Diagnosis not present

## 2014-10-10 DIAGNOSIS — R2681 Unsteadiness on feet: Secondary | ICD-10-CM | POA: Diagnosis not present

## 2014-10-10 DIAGNOSIS — R278 Other lack of coordination: Secondary | ICD-10-CM | POA: Diagnosis not present

## 2014-10-11 DIAGNOSIS — M6281 Muscle weakness (generalized): Secondary | ICD-10-CM | POA: Diagnosis not present

## 2014-10-11 DIAGNOSIS — N179 Acute kidney failure, unspecified: Secondary | ICD-10-CM | POA: Diagnosis not present

## 2014-10-11 DIAGNOSIS — R2681 Unsteadiness on feet: Secondary | ICD-10-CM | POA: Diagnosis not present

## 2014-10-11 DIAGNOSIS — R278 Other lack of coordination: Secondary | ICD-10-CM | POA: Diagnosis not present

## 2014-10-11 DIAGNOSIS — R54 Age-related physical debility: Secondary | ICD-10-CM | POA: Diagnosis not present

## 2014-10-12 DIAGNOSIS — R278 Other lack of coordination: Secondary | ICD-10-CM | POA: Diagnosis not present

## 2014-10-12 DIAGNOSIS — R2681 Unsteadiness on feet: Secondary | ICD-10-CM | POA: Diagnosis not present

## 2014-10-12 DIAGNOSIS — M6281 Muscle weakness (generalized): Secondary | ICD-10-CM | POA: Diagnosis not present

## 2014-10-12 DIAGNOSIS — R54 Age-related physical debility: Secondary | ICD-10-CM | POA: Diagnosis not present

## 2014-10-13 DIAGNOSIS — R2681 Unsteadiness on feet: Secondary | ICD-10-CM | POA: Diagnosis not present

## 2014-10-13 DIAGNOSIS — R278 Other lack of coordination: Secondary | ICD-10-CM | POA: Diagnosis not present

## 2014-10-13 DIAGNOSIS — R54 Age-related physical debility: Secondary | ICD-10-CM | POA: Diagnosis not present

## 2014-10-13 DIAGNOSIS — M6281 Muscle weakness (generalized): Secondary | ICD-10-CM | POA: Diagnosis not present

## 2014-10-14 ENCOUNTER — Ambulatory Visit (INDEPENDENT_AMBULATORY_CARE_PROVIDER_SITE_OTHER): Payer: Medicare Other | Admitting: Family Medicine

## 2014-10-14 ENCOUNTER — Encounter: Payer: Self-pay | Admitting: Family Medicine

## 2014-10-14 VITALS — Temp 97.6°F | Ht 61.0 in | Wt 141.3 lb

## 2014-10-14 DIAGNOSIS — R5381 Other malaise: Secondary | ICD-10-CM

## 2014-10-14 DIAGNOSIS — I481 Persistent atrial fibrillation: Secondary | ICD-10-CM | POA: Diagnosis not present

## 2014-10-14 DIAGNOSIS — Z86711 Personal history of pulmonary embolism: Secondary | ICD-10-CM

## 2014-10-14 DIAGNOSIS — D500 Iron deficiency anemia secondary to blood loss (chronic): Secondary | ICD-10-CM

## 2014-10-14 DIAGNOSIS — I4819 Other persistent atrial fibrillation: Secondary | ICD-10-CM

## 2014-10-14 DIAGNOSIS — N179 Acute kidney failure, unspecified: Secondary | ICD-10-CM | POA: Diagnosis not present

## 2014-10-14 DIAGNOSIS — Z23 Encounter for immunization: Secondary | ICD-10-CM

## 2014-10-14 DIAGNOSIS — R160 Hepatomegaly, not elsewhere classified: Secondary | ICD-10-CM

## 2014-10-14 LAB — CBC
HCT: 30 % — ABNORMAL LOW (ref 36.0–46.0)
Hemoglobin: 10.2 g/dL — ABNORMAL LOW (ref 12.0–15.0)
MCH: 30 pg (ref 26.0–34.0)
MCHC: 34 g/dL (ref 30.0–36.0)
MCV: 88.2 fL (ref 78.0–100.0)
MPV: 8.2 fL — ABNORMAL LOW (ref 8.6–12.4)
Platelets: 257 10*3/uL (ref 150–400)
RBC: 3.4 MIL/uL — ABNORMAL LOW (ref 3.87–5.11)
RDW: 15.7 % — ABNORMAL HIGH (ref 11.5–15.5)
WBC: 5.7 10*3/uL (ref 4.0–10.5)

## 2014-10-14 LAB — RENAL FUNCTION PANEL
Albumin: 3.6 g/dL (ref 3.6–5.1)
BUN: 41 mg/dL — ABNORMAL HIGH (ref 7–25)
CO2: 20 mmol/L (ref 20–31)
Calcium: 9.3 mg/dL (ref 8.6–10.4)
Chloride: 99 mmol/L (ref 98–110)
Creat: 2.98 mg/dL — ABNORMAL HIGH (ref 0.60–0.88)
Glucose, Bld: 94 mg/dL (ref 65–99)
Phosphorus: 4.2 mg/dL (ref 2.1–4.3)
Potassium: 4.1 mmol/L (ref 3.5–5.3)
Sodium: 134 mmol/L — ABNORMAL LOW (ref 135–146)

## 2014-10-14 NOTE — Progress Notes (Signed)
   Subjective:    Patient ID: Sara Russell, female    DOB: Jul 02, 1930, 81 y.o.   MRN: [id number]  HPI FU hospitalization.  She has been at assisted living receiving PT and OT to rehab.  Today, she moves to residential status but PT and OT will continue. She is clearly improving.  It is strongly suspected that she has an abdominal malignancy - and is recovering from sepsis.  The future is difficult to predict.  She is prepared to face whatever comes.   Renal failure has been slow to recover.  Will recheck creat.  She would like to get off the renal diet and her potassium pills.  Still has leg swelling so undoubtably still needs lasix.    Review of Systems     Objective:   Physical Exam Pale appearing. Lungs clear Cardiac Irreg/irreg HR ~90 2+ symetric leg edema.       Assessment & Plan:

## 2014-10-14 NOTE — Assessment & Plan Note (Signed)
Remain on anticoag for now.  Will stop if we hit comfort care.

## 2014-10-14 NOTE — Patient Instructions (Signed)
You are making very nice progress. I will call Tuesday with blood test results. Remind me when I call, if the blood tests are OK, I will want to stop your potassium pills and liberalize the potassium intake in your diet.   See me in one month.  Sooner if problems.

## 2014-10-14 NOTE — Assessment & Plan Note (Signed)
Stable, monitor.  °

## 2014-10-14 NOTE — Assessment & Plan Note (Signed)
Rate controled

## 2014-10-14 NOTE — Assessment & Plan Note (Signed)
Nicely improving with PT and time.

## 2014-10-14 NOTE — Assessment & Plan Note (Signed)
Unclear if benign or malignant.  Repeat MRI if patient stabilizes.

## 2014-10-17 DIAGNOSIS — R54 Age-related physical debility: Secondary | ICD-10-CM | POA: Diagnosis not present

## 2014-10-17 DIAGNOSIS — R278 Other lack of coordination: Secondary | ICD-10-CM | POA: Diagnosis not present

## 2014-10-17 DIAGNOSIS — R2681 Unsteadiness on feet: Secondary | ICD-10-CM | POA: Diagnosis not present

## 2014-10-17 DIAGNOSIS — M6281 Muscle weakness (generalized): Secondary | ICD-10-CM | POA: Diagnosis not present

## 2014-10-18 DIAGNOSIS — M6281 Muscle weakness (generalized): Secondary | ICD-10-CM | POA: Diagnosis not present

## 2014-10-18 DIAGNOSIS — R2681 Unsteadiness on feet: Secondary | ICD-10-CM | POA: Diagnosis not present

## 2014-10-18 DIAGNOSIS — R54 Age-related physical debility: Secondary | ICD-10-CM | POA: Diagnosis not present

## 2014-10-18 DIAGNOSIS — R278 Other lack of coordination: Secondary | ICD-10-CM | POA: Diagnosis not present

## 2014-10-18 NOTE — Addendum Note (Signed)
Addended by: Zenia Resides on: 10/18/2014 06:47 PM   Modules accepted: Orders, Medications

## 2014-10-18 NOTE — Assessment & Plan Note (Signed)
Creat slowly improving and potassium OK.  Will DC both potassium restriction in diet and potassium supplement.  Recheck BMP by renal in one week.

## 2014-10-19 DIAGNOSIS — R2681 Unsteadiness on feet: Secondary | ICD-10-CM | POA: Diagnosis not present

## 2014-10-19 DIAGNOSIS — R54 Age-related physical debility: Secondary | ICD-10-CM | POA: Diagnosis not present

## 2014-10-19 DIAGNOSIS — M6281 Muscle weakness (generalized): Secondary | ICD-10-CM | POA: Diagnosis not present

## 2014-10-19 DIAGNOSIS — R278 Other lack of coordination: Secondary | ICD-10-CM | POA: Diagnosis not present

## 2014-10-20 DIAGNOSIS — M6281 Muscle weakness (generalized): Secondary | ICD-10-CM | POA: Diagnosis not present

## 2014-10-20 DIAGNOSIS — R54 Age-related physical debility: Secondary | ICD-10-CM | POA: Diagnosis not present

## 2014-10-20 DIAGNOSIS — R2681 Unsteadiness on feet: Secondary | ICD-10-CM | POA: Diagnosis not present

## 2014-10-20 DIAGNOSIS — R278 Other lack of coordination: Secondary | ICD-10-CM | POA: Diagnosis not present

## 2014-10-21 DIAGNOSIS — M6281 Muscle weakness (generalized): Secondary | ICD-10-CM | POA: Diagnosis not present

## 2014-10-21 DIAGNOSIS — R278 Other lack of coordination: Secondary | ICD-10-CM | POA: Diagnosis not present

## 2014-10-21 DIAGNOSIS — R2681 Unsteadiness on feet: Secondary | ICD-10-CM | POA: Diagnosis not present

## 2014-10-21 DIAGNOSIS — R54 Age-related physical debility: Secondary | ICD-10-CM | POA: Diagnosis not present

## 2014-10-24 DIAGNOSIS — R54 Age-related physical debility: Secondary | ICD-10-CM | POA: Diagnosis not present

## 2014-10-24 DIAGNOSIS — R2681 Unsteadiness on feet: Secondary | ICD-10-CM | POA: Diagnosis not present

## 2014-10-24 DIAGNOSIS — M6281 Muscle weakness (generalized): Secondary | ICD-10-CM | POA: Diagnosis not present

## 2014-10-24 DIAGNOSIS — R278 Other lack of coordination: Secondary | ICD-10-CM | POA: Diagnosis not present

## 2014-10-25 DIAGNOSIS — R2681 Unsteadiness on feet: Secondary | ICD-10-CM | POA: Diagnosis not present

## 2014-10-25 DIAGNOSIS — M6281 Muscle weakness (generalized): Secondary | ICD-10-CM | POA: Diagnosis not present

## 2014-10-25 DIAGNOSIS — R278 Other lack of coordination: Secondary | ICD-10-CM | POA: Diagnosis not present

## 2014-10-25 DIAGNOSIS — R54 Age-related physical debility: Secondary | ICD-10-CM | POA: Diagnosis not present

## 2014-10-26 DIAGNOSIS — R54 Age-related physical debility: Secondary | ICD-10-CM | POA: Diagnosis not present

## 2014-10-26 DIAGNOSIS — M6281 Muscle weakness (generalized): Secondary | ICD-10-CM | POA: Diagnosis not present

## 2014-10-26 DIAGNOSIS — R278 Other lack of coordination: Secondary | ICD-10-CM | POA: Diagnosis not present

## 2014-10-26 DIAGNOSIS — R2681 Unsteadiness on feet: Secondary | ICD-10-CM | POA: Diagnosis not present

## 2014-10-27 DIAGNOSIS — N179 Acute kidney failure, unspecified: Secondary | ICD-10-CM | POA: Diagnosis not present

## 2014-10-28 DIAGNOSIS — R278 Other lack of coordination: Secondary | ICD-10-CM | POA: Diagnosis not present

## 2014-10-28 DIAGNOSIS — R2681 Unsteadiness on feet: Secondary | ICD-10-CM | POA: Diagnosis not present

## 2014-10-28 DIAGNOSIS — R54 Age-related physical debility: Secondary | ICD-10-CM | POA: Diagnosis not present

## 2014-10-28 DIAGNOSIS — M6281 Muscle weakness (generalized): Secondary | ICD-10-CM | POA: Diagnosis not present

## 2014-10-31 DIAGNOSIS — R2681 Unsteadiness on feet: Secondary | ICD-10-CM | POA: Diagnosis not present

## 2014-10-31 DIAGNOSIS — R278 Other lack of coordination: Secondary | ICD-10-CM | POA: Diagnosis not present

## 2014-10-31 DIAGNOSIS — M6281 Muscle weakness (generalized): Secondary | ICD-10-CM | POA: Diagnosis not present

## 2014-10-31 DIAGNOSIS — R54 Age-related physical debility: Secondary | ICD-10-CM | POA: Diagnosis not present

## 2014-11-01 ENCOUNTER — Other Ambulatory Visit: Payer: Self-pay | Admitting: Family Medicine

## 2014-11-01 DIAGNOSIS — R278 Other lack of coordination: Secondary | ICD-10-CM | POA: Diagnosis not present

## 2014-11-01 DIAGNOSIS — E876 Hypokalemia: Secondary | ICD-10-CM | POA: Diagnosis not present

## 2014-11-01 DIAGNOSIS — R54 Age-related physical debility: Secondary | ICD-10-CM | POA: Diagnosis not present

## 2014-11-01 DIAGNOSIS — M6281 Muscle weakness (generalized): Secondary | ICD-10-CM | POA: Diagnosis not present

## 2014-11-01 DIAGNOSIS — R2681 Unsteadiness on feet: Secondary | ICD-10-CM | POA: Diagnosis not present

## 2014-11-01 MED ORDER — CARVEDILOL 6.25 MG PO TABS: 6.2500 mg | ORAL_TABLET | Freq: Two times a day (BID) | ORAL | Status: DC

## 2014-11-01 MED ORDER — TRAMADOL HCL 50 MG PO TABS: 50.0000 mg | ORAL_TABLET | Freq: Four times a day (QID) | ORAL | Status: AC | PRN

## 2014-11-01 MED ORDER — APIXABAN 2.5 MG PO TABS: 2.5000 mg | ORAL_TABLET | Freq: Two times a day (BID) | ORAL | Status: DC

## 2014-11-01 MED ORDER — OMEPRAZOLE 40 MG PO CPDR: 40.0000 mg | DELAYED_RELEASE_CAPSULE | Freq: Every day | ORAL | Status: DC

## 2014-11-01 MED ORDER — CALCIUM POLYCARBOPHIL 625 MG PO TABS: 625.0000 mg | ORAL_TABLET | Freq: Two times a day (BID) | ORAL | Status: DC

## 2014-11-01 MED ORDER — AMIODARONE HCL 200 MG PO TABS: 200.0000 mg | ORAL_TABLET | Freq: Every day | ORAL | Status: DC

## 2014-11-01 NOTE — Telephone Encounter (Signed)
Need refills on: tramadol, omeprozole, carvedilol, apizaban, amiodarone, fiber con [address] [facility] on [address]

## 2014-11-03 DIAGNOSIS — R54 Age-related physical debility: Secondary | ICD-10-CM | POA: Diagnosis not present

## 2014-11-03 DIAGNOSIS — R278 Other lack of coordination: Secondary | ICD-10-CM | POA: Diagnosis not present

## 2014-11-03 DIAGNOSIS — M6281 Muscle weakness (generalized): Secondary | ICD-10-CM | POA: Diagnosis not present

## 2014-11-03 DIAGNOSIS — R2681 Unsteadiness on feet: Secondary | ICD-10-CM | POA: Diagnosis not present

## 2014-11-04 DIAGNOSIS — M6281 Muscle weakness (generalized): Secondary | ICD-10-CM | POA: Diagnosis not present

## 2014-11-04 DIAGNOSIS — R2681 Unsteadiness on feet: Secondary | ICD-10-CM | POA: Diagnosis not present

## 2014-11-04 DIAGNOSIS — R278 Other lack of coordination: Secondary | ICD-10-CM | POA: Diagnosis not present

## 2014-11-04 DIAGNOSIS — R54 Age-related physical debility: Secondary | ICD-10-CM | POA: Diagnosis not present

## 2014-11-07 ENCOUNTER — Telehealth: Payer: Self-pay | Admitting: Family Medicine

## 2014-11-07 DIAGNOSIS — R2681 Unsteadiness on feet: Secondary | ICD-10-CM | POA: Diagnosis not present

## 2014-11-07 DIAGNOSIS — R54 Age-related physical debility: Secondary | ICD-10-CM | POA: Diagnosis not present

## 2014-11-07 DIAGNOSIS — M6281 Muscle weakness (generalized): Secondary | ICD-10-CM | POA: Diagnosis not present

## 2014-11-07 DIAGNOSIS — R278 Other lack of coordination: Secondary | ICD-10-CM | POA: Diagnosis not present

## 2014-11-07 MED ORDER — FUROSEMIDE 20 MG PO TABS: 20.0000 mg | ORAL_TABLET | Freq: Two times a day (BID) | ORAL | Status: DC

## 2014-11-07 NOTE — Telephone Encounter (Signed)
Pt is calling about her Lasix. She was taking 40 BID, then in the hospital they put her on 20 BID, and Now when the recent lasix was refilled it was sent in at 80 BID. She thinks that this is wrong and needs to talk to a nurse. She is not taking anything until speaking to a nurse. jw

## 2014-11-07 NOTE — Telephone Encounter (Signed)
Per my bright, former RN, well informed patient, her dose of lasix should be 20 mg bid per nephrology.  Will refill that dose.

## 2014-11-07 NOTE — Telephone Encounter (Signed)
Left message for patient to call nurse back regarding medication.  Derl Barrow, RN

## 2014-11-08 DIAGNOSIS — M6281 Muscle weakness (generalized): Secondary | ICD-10-CM | POA: Diagnosis not present

## 2014-11-08 DIAGNOSIS — R278 Other lack of coordination: Secondary | ICD-10-CM | POA: Diagnosis not present

## 2014-11-08 DIAGNOSIS — R2681 Unsteadiness on feet: Secondary | ICD-10-CM | POA: Diagnosis not present

## 2014-11-08 DIAGNOSIS — R54 Age-related physical debility: Secondary | ICD-10-CM | POA: Diagnosis not present

## 2014-11-10 DIAGNOSIS — R278 Other lack of coordination: Secondary | ICD-10-CM | POA: Diagnosis not present

## 2014-11-10 DIAGNOSIS — M6281 Muscle weakness (generalized): Secondary | ICD-10-CM | POA: Diagnosis not present

## 2014-11-10 DIAGNOSIS — R54 Age-related physical debility: Secondary | ICD-10-CM | POA: Diagnosis not present

## 2014-11-10 DIAGNOSIS — R2681 Unsteadiness on feet: Secondary | ICD-10-CM | POA: Diagnosis not present

## 2014-11-14 DIAGNOSIS — R278 Other lack of coordination: Secondary | ICD-10-CM | POA: Diagnosis not present

## 2014-11-14 DIAGNOSIS — M6281 Muscle weakness (generalized): Secondary | ICD-10-CM | POA: Diagnosis not present

## 2014-11-14 DIAGNOSIS — R54 Age-related physical debility: Secondary | ICD-10-CM | POA: Diagnosis not present

## 2014-11-16 DIAGNOSIS — R54 Age-related physical debility: Secondary | ICD-10-CM | POA: Diagnosis not present

## 2014-11-16 DIAGNOSIS — R278 Other lack of coordination: Secondary | ICD-10-CM | POA: Diagnosis not present

## 2014-11-16 DIAGNOSIS — M6281 Muscle weakness (generalized): Secondary | ICD-10-CM | POA: Diagnosis not present

## 2014-11-17 ENCOUNTER — Ambulatory Visit (INDEPENDENT_AMBULATORY_CARE_PROVIDER_SITE_OTHER): Payer: Medicare Other | Admitting: Family Medicine

## 2014-11-17 ENCOUNTER — Encounter: Payer: Self-pay | Admitting: Family Medicine

## 2014-11-17 VITALS — BP 126/60 | HR 60 | Temp 97.5°F | Ht 61.0 in | Wt 139.5 lb

## 2014-11-17 DIAGNOSIS — N179 Acute kidney failure, unspecified: Secondary | ICD-10-CM

## 2014-11-17 DIAGNOSIS — I5032 Chronic diastolic (congestive) heart failure: Secondary | ICD-10-CM

## 2014-11-17 DIAGNOSIS — R160 Hepatomegaly, not elsewhere classified: Secondary | ICD-10-CM

## 2014-11-17 DIAGNOSIS — E7800 Pure hypercholesterolemia, unspecified: Secondary | ICD-10-CM | POA: Diagnosis not present

## 2014-11-17 DIAGNOSIS — D500 Iron deficiency anemia secondary to blood loss (chronic): Secondary | ICD-10-CM

## 2014-11-17 DIAGNOSIS — I10 Essential (primary) hypertension: Secondary | ICD-10-CM

## 2014-11-17 LAB — COMPREHENSIVE METABOLIC PANEL
ALT: 17 U/L (ref 6–29)
AST: 20 U/L (ref 10–35)
Albumin: 3.8 g/dL (ref 3.6–5.1)
Alkaline Phosphatase: 109 U/L (ref 33–130)
BUN: 28 mg/dL — ABNORMAL HIGH (ref 7–25)
CO2: 28 mmol/L (ref 20–31)
Calcium: 9.1 mg/dL (ref 8.6–10.4)
Chloride: 96 mmol/L — ABNORMAL LOW (ref 98–110)
Creat: 1.87 mg/dL — ABNORMAL HIGH (ref 0.60–0.88)
Glucose, Bld: 95 mg/dL (ref 65–99)
Potassium: 3.9 mmol/L (ref 3.5–5.3)
Sodium: 132 mmol/L — ABNORMAL LOW (ref 135–146)
Total Bilirubin: 0.5 mg/dL (ref 0.2–1.2)
Total Protein: 6.8 g/dL (ref 6.1–8.1)

## 2014-11-17 LAB — CBC
HCT: 33.2 % — ABNORMAL LOW (ref 36.0–46.0)
Hemoglobin: 11.1 g/dL — ABNORMAL LOW (ref 12.0–15.0)
MCH: 29.5 pg (ref 26.0–34.0)
MCHC: 33.4 g/dL (ref 30.0–36.0)
MCV: 88.3 fL (ref 78.0–100.0)
MPV: 8.3 fL — ABNORMAL LOW (ref 8.6–12.4)
Platelets: 226 10*3/uL (ref 150–400)
RBC: 3.76 MIL/uL — ABNORMAL LOW (ref 3.87–5.11)
RDW: 14.7 % (ref 11.5–15.5)
WBC: 5.5 10*3/uL (ref 4.0–10.5)

## 2014-11-17 NOTE — Assessment & Plan Note (Addendum)
Asymptomatic on current meds. Called with results.  Given that she is euvolemic on exam and has improving renal function, will decrease lasix to once daily.

## 2014-11-17 NOTE — Assessment & Plan Note (Signed)
Well controled.

## 2014-11-17 NOTE — Assessment & Plan Note (Signed)
Will check CMP for LFTs.  This may turn out to be benign.

## 2014-11-17 NOTE — Progress Notes (Signed)
   Subjective:    Patient ID: Sara Russell, female    DOB: [birth year], 81 y.o.   MRN: [id number]  HPI Well, I may have been wrong once more. Patient admitted to hosp early August with severe sepsis and AKI and found to have a likely malignant neoplasm near liver.  She chose no biopsy and mostly comfort approach. She recovered from sepsis, went to rehab and is now living independantly.  Feels stronger and is clearly improving.  No complaints at present.  We were never clear of the source of infection - thought it was likely secondary to the neoplasm.  Nicely, no fever or SIRS since.        Review of Systems     Objective:   Physical Exam Wt noted to be lower.  VS fine.   Lungs clear Cardiac RRR with 2/6 SEM Abd benign Ext no edema       Assessment & Plan:

## 2014-11-17 NOTE — Patient Instructions (Signed)
You look great.   I hope I am wrong and that you do not have cancer. I will call tomorrow with the blood test results.   For now, we will hold off on repeating the MRI. Do keep your cardiology and renal appointments.

## 2014-11-17 NOTE — Assessment & Plan Note (Addendum)
Recheck creat, K+ May not still need lasix. Called with results.  Will decrease lasix to once daily.

## 2014-11-17 NOTE — Assessment & Plan Note (Signed)
Check labs.  Remains on iron.

## 2014-11-18 ENCOUNTER — Encounter: Payer: Self-pay | Admitting: Family Medicine

## 2014-11-18 MED ORDER — FUROSEMIDE 20 MG PO TABS: 20.0000 mg | ORAL_TABLET | Freq: Every day | ORAL | Status: DC

## 2014-11-18 NOTE — Addendum Note (Signed)
Addended by: Zenia Resides on: 11/18/2014 10:18 AM   Modules accepted: Orders

## 2014-11-21 ENCOUNTER — Telehealth: Payer: Self-pay | Admitting: Family Medicine

## 2014-11-21 DIAGNOSIS — I5032 Chronic diastolic (congestive) heart failure: Secondary | ICD-10-CM

## 2014-11-21 MED ORDER — APIXABAN 2.5 MG PO TABS: 2.5000 mg | ORAL_TABLET | Freq: Two times a day (BID) | ORAL | Status: DC

## 2014-11-21 MED ORDER — ATORVASTATIN CALCIUM 10 MG PO TABS: ORAL_TABLET | ORAL | Status: DC

## 2014-11-21 MED ORDER — AMIODARONE HCL 200 MG PO TABS: 200.0000 mg | ORAL_TABLET | Freq: Every day | ORAL | Status: DC

## 2014-11-21 MED ORDER — FUROSEMIDE 20 MG PO TABS: 20.0000 mg | ORAL_TABLET | Freq: Every day | ORAL | Status: DC

## 2014-11-21 MED ORDER — OMEPRAZOLE 40 MG PO CPDR: 40.0000 mg | DELAYED_RELEASE_CAPSULE | Freq: Every day | ORAL | Status: DC

## 2014-11-21 MED ORDER — CARVEDILOL 6.25 MG PO TABS: 6.2500 mg | ORAL_TABLET | Freq: Two times a day (BID) | ORAL | Status: DC

## 2014-11-21 NOTE — Telephone Encounter (Signed)
Pt calling because she needs a refill on her Amiodarone 200 mg and Atorvastatin 10 mg sent to [facility] on [address].  Additionally the following medications (Along with the two previously mentioned) need to be filled at [facility] on [address] in the future:   Apixaban  Carvedilol 6.25 mg  Furosemide 20 mg  Omeprazole 40 mg  Sadie Reynolds, ASA

## 2014-11-22 DIAGNOSIS — R278 Other lack of coordination: Secondary | ICD-10-CM | POA: Diagnosis not present

## 2014-11-22 DIAGNOSIS — M6281 Muscle weakness (generalized): Secondary | ICD-10-CM | POA: Diagnosis not present

## 2014-11-22 DIAGNOSIS — R54 Age-related physical debility: Secondary | ICD-10-CM | POA: Diagnosis not present

## 2014-11-23 ENCOUNTER — Ambulatory Visit: Payer: Medicare Other | Admitting: Internal Medicine

## 2014-11-24 DIAGNOSIS — R54 Age-related physical debility: Secondary | ICD-10-CM | POA: Diagnosis not present

## 2014-11-24 DIAGNOSIS — R278 Other lack of coordination: Secondary | ICD-10-CM | POA: Diagnosis not present

## 2014-11-24 DIAGNOSIS — M6281 Muscle weakness (generalized): Secondary | ICD-10-CM | POA: Diagnosis not present

## 2014-11-25 DIAGNOSIS — I10 Essential (primary) hypertension: Secondary | ICD-10-CM | POA: Diagnosis not present

## 2014-11-25 DIAGNOSIS — N183 Chronic kidney disease, stage 3 (moderate): Secondary | ICD-10-CM | POA: Diagnosis not present

## 2014-11-25 DIAGNOSIS — E876 Hypokalemia: Secondary | ICD-10-CM | POA: Diagnosis not present

## 2014-11-30 ENCOUNTER — Ambulatory Visit (INDEPENDENT_AMBULATORY_CARE_PROVIDER_SITE_OTHER): Payer: Medicare Other | Admitting: Internal Medicine

## 2014-11-30 ENCOUNTER — Encounter: Payer: Self-pay | Admitting: Internal Medicine

## 2014-11-30 VITALS — BP 136/62 | HR 70 | Ht 61.5 in | Wt 141.0 lb

## 2014-11-30 DIAGNOSIS — I481 Persistent atrial fibrillation: Secondary | ICD-10-CM | POA: Diagnosis not present

## 2014-11-30 DIAGNOSIS — I4819 Other persistent atrial fibrillation: Secondary | ICD-10-CM

## 2014-11-30 DIAGNOSIS — I10 Essential (primary) hypertension: Secondary | ICD-10-CM

## 2014-11-30 DIAGNOSIS — I4891 Unspecified atrial fibrillation: Secondary | ICD-10-CM | POA: Diagnosis not present

## 2014-11-30 LAB — TSH: TSH: 25.653 u[IU]/mL — ABNORMAL HIGH (ref 0.350–4.500)

## 2014-11-30 NOTE — Progress Notes (Signed)
Electrophysiology Office Note   Date:  11/30/2014   ID:  Sara Russell, DOB 1931/01/01, MRN [id number]  PCP:  Zigmund Gottron, MD    Primary Electrophysiologist: Dr. [name] Nephrology: Dr. Mercy Moore  Chief Complaint  Patient presents with  . Persistent AFIB  . Chronic AFIB     History of Present Illness: Sara Russell is a 81 y.o. female who presents today for routine electrophysiology visit.  She mentions she feels about a week ago she feels like her heart beat has "settled down".  She had a lengthy hospital stay in August and is slowly continuing to recover from this.  She was found with severe sepsis, though the source never found, ARI and an abdominal mass near the liver reports say concerning for malignancy but not w/u at the patient's preference.  She reports today she is pending a f/u MRI fror this via her PMD in the next couple months.  She has been following with nephrology as well with improving kidney function.  Today, she denies symptoms of palpitations, chest pain, shortness of breath, orthopnea, PND, lower extremity edema, claudication, dizziness, presyncope, syncope, bleeding, or neurologic sequela. The patient is tolerating medications without difficulties and is otherwise without complaint today.    Past Medical History  Diagnosis Date  . S/P dilatation of esophageal stricture 2002  . Hypertension   . Hyperlipidemia   . DVT (deep venous thrombosis) ([place]) 2009  . PE (pulmonary embolism) 2009  . GERD (gastroesophageal reflux disease)   . Diverticulosis   . Iron deficiency anemia   . Hyperplastic colon polyp   . Positive H. pylori test 01/20/96  . Degenerative joint disease   . Skin cancer     h/o basal and squamous skin cancer--> removed  . Persistent atrial fibrillation ([place])   . Shortness of breath dyspnea   . AKI (acute kidney injury) ([place]) 09/15/2014  . Colitis, ulcerative ([place])   . Abdominal mass   . Atrial fibrillation [facility])    Past  Surgical History  Procedure Laterality Date  . Dilation and curettage of uterus      for endometrial polyps  . Replacement total knee      left  . Tonsillectomy    . Appendectomy    . Breast lumpectomy      benign  . Joint replacement       Current Outpatient Prescriptions  Medication Sig Dispense Refill  . acetaminophen (TYLENOL) 650 MG CR tablet Take 650 mg by mouth every 8 (eight) hours as needed for pain.    Marland Kitchen amiodarone (PACERONE) 200 MG tablet Take 1 tablet (200 mg total) by mouth daily. 30 tablet 6  . apixaban (ELIQUIS) 2.5 MG TABS tablet Take 1 tablet (2.5 mg total) by mouth 2 (two) times daily. 60 tablet 12  . atorvastatin (LIPITOR) 10 MG tablet TAKE ONE TABLET BY MOUTH ONCE DAILY AT  6PM 90 tablet 3  . calcium-vitamin D (SM CALCIUM-VITAMIN D) 500-200 MG-UNIT per tablet Take 1 tablet by mouth 2 (two) times daily.     . carvedilol (COREG) 6.25 MG tablet Take 1 tablet (6.25 mg total) by mouth 2 (two) times daily with a meal. 180 tablet 3  . ferrous sulfate 325 (65 FE) MG tablet Take 1 tablet (325 mg total) by mouth daily with breakfast. 1 tablet 0  . furosemide (LASIX) 20 MG tablet Take 1 tablet (20 mg total) by mouth daily. 180 tablet 3  . omeprazole (PRILOSEC) 40 MG capsule Take 1 capsule (  40 mg total) by mouth daily. 90 capsule 3  . polycarbophil (FIBERCON) 625 MG tablet Take 1 tablet (625 mg total) by mouth 2 (two) times daily. 180 tablet 3  . saccharomyces boulardii (FLORASTOR) 250 MG capsule Take 1 capsule (250 mg total) by mouth 2 (two) times daily.    . traMADol (ULTRAM) 50 MG tablet Take 1 tablet (50 mg total) by mouth every 6 (six) hours as needed. for pain 180 tablet 1  . [DISCONTINUED] omeprazole (PRILOSEC OTC) 20 MG tablet Take 20 mg by mouth 2 (two) times daily.       No current facility-administered medications for this visit.    Allergies:   Iohexol; Ivp dye; Scallops; Sulfa antibiotics; and Sulfamethoxazole   Social History:  The patient  reports that she  quit smoking about 53 years ago. Her smoking use included Cigarettes. She started smoking about 66 years ago. She has a 6.5 pack-year smoking history. She has never used smokeless tobacco. She reports that she drinks alcohol. She reports that she does not use illicit drugs.   Family History:  The patient's  family history includes Diverticulitis in her brother; Esophageal cancer in her brother; Heart disease in her mother; Stroke in her mother. There is no history of Colon cancer.    ROS:  Please see the history of present illness.   All other systems are reviewed and negative.    PHYSICAL EXAM: VS:  BP 136/62 mmHg  Pulse 70  Ht 5' 1.5" (1.562 m)  Wt 141 lb (63.957 kg)  BMI 26.21 kg/m2 , BMI Body mass index is 26.21 kg/(m^2). GEN: Well nourished, well developed, in no acute distress HEENT: normal Neck: no JVD, carotid bruits, or masses Cardiac: RRR; no significant murmurs, no rubs, or gallops,no edema  Respiratory:  clear to auscultation bilaterally, normal work of breathing GI: soft, nontender, nondistended, + BS MS: no deformity or atrophy Skin: warm and dry  Neuro:  Strength and sensation are intact Psych: euthymic mood, full affect  EKG:  EKG is ordered today. The ekg ordered today shows SR  09/16/14: Echocardiogram Impressions: - The patient was in atrial fibrillation. Normal LV size with EF 45-50%. Septal hypokinesis. Mild to moderate RV dilation with mildly decreased RV systolic function. Moderate to severe TR, mild to moderate pulmonary hypertension. Severe biatrial enlargement.    Recent Labs: 09/23/2014: Magnesium 1.7 11/17/2014: ALT 17; BUN 28*; Creat 1.87*; Hemoglobin 11.1*; Platelets 226; Potassium 3.9; Sodium 132*  11/17/14: AST 20, ALT 17   Lipid Panel     Component Value Date/Time   CHOL 89 09/16/2014 0617   TRIG 180* 09/16/2014 0617   HDL 13* 09/16/2014 0617   CHOLHDL 6.8 09/16/2014 0617   VLDL 36 09/16/2014 0617   LDLCALC 40 09/16/2014 0617    LDLDIRECT 85 08/18/2008 2030     Wt Readings from Last 3 Encounters:  11/30/14 141 lb (63.957 kg)  11/17/14 139 lb 8 oz (63.277 kg)  10/14/14 141 lb 4.8 oz (64.093 kg)      Other studies Reviewed: Additional studies/ records that were reviewed today include labs, hospital record  ASSESSMENT AND PLAN:  1.  AFib,       she historically felt to be persistant or even permanent, though during her hospital stay she was started on amiodarone and presents today is SR      She is on renal dose Eliquis without bleeding reported      CHADS2Vasc is at least 4      She  will see AFib clinic in 59moand we will plan for PFTs then and draw a TSH today give her amiodarone  2. HTN     Appears well controlled, she is following this with her nephrologist  Follow-up:  623moith AFib clinic, her nephrologist and PMD as directed by them  Current medicines are reviewed at length with the patient today.   The patient does not have concerns regarding her medicines.  The following changes were made today:  none  Labs/ tests ordered today include:  Orders Placed This Encounter  Procedures  . TSH  . EKG 12-Lead  . Pulmonary function test     SiArmy FossaD 11/30/2014 12:49 PM     [place] [phone]) [phone])

## 2014-11-30 NOTE — Patient Instructions (Addendum)
Medication Instructions: - no changes  Labwork: - Your physician recommends that you have lab work today: TSH  Procedures/Testing: - Your physician has recommended that you have a pulmonary function test- in 6 months. Pulmonary Function Tests are a group of tests that measure how well air moves in and out of your lungs.  Follow-Up: - Your physician wants you to follow-up in: in 6 months with Roderic Palau, NP in the A-fib clinic. You will receive a reminder letter in the mail two months in advance. If you don't receive a letter, please call our office to schedule the follow-up appointment.  Any Additional Special Instructions Will Be Listed Below (If Applicable). - none

## 2014-12-01 ENCOUNTER — Telehealth: Payer: Self-pay | Admitting: Family Medicine

## 2014-12-01 ENCOUNTER — Telehealth: Payer: Self-pay | Admitting: Internal Medicine

## 2014-12-01 DIAGNOSIS — Z79899 Other long term (current) drug therapy: Secondary | ICD-10-CM

## 2014-12-01 MED ORDER — LEVOTHYROXINE SODIUM 25 MCG PO TABS: 25.0000 ug | ORAL_TABLET | Freq: Every day | ORAL | Status: DC

## 2014-12-01 NOTE — Telephone Encounter (Signed)
Notes Recorded by Stanton Kidney, RN on 12/01/2014 at 4:14 PM Patient returns call.  Agreeable to come by office tomorrow for T3 & T4. Requested rx be sent to [facility]. Will route this information to her PCP and I will follow up with her next week about tomorrows lab. Patient verbalized understanding and agreeable to plan.   Notes Recorded by Stanton Kidney, RN on 12/01/2014 at 11:36 AM Reviewed results with DOD, Dr. Radford Pax. Orders received: Obtain T3 & T4, route results to PCP. Start Synthroid 25 mcg daily, repeat TSH in 6 weeks w/ PCP. Route this information to PCP.  lmtcb on patient's home phone       Ref Range 1d ago (11/30/14) 471yrago (08/02/13) 1741yrgo (12/10/12) 5y341yro (07/05/09) 41yr88yr (04/11/08) 73yr 741yr(04/08/07)    TSH 0.350 - 4.500 uIU/mL 25.653 (H) 1.240 1.943 3.475R, CM 3.335R, CM 5.026R, CM   Resulting Agency  [facility]

## 2014-12-01 NOTE — Telephone Encounter (Signed)
New Message   Pt calling for triage RN to give her   Her lab results

## 2014-12-01 NOTE — Telephone Encounter (Signed)
Pt calling to let Dr. Andria Frames know she went to see Dr. [name] yesterday for her Afib, says everything is good and back to normal but they ran labs on her and her thyroid was abnormal, it was 25.6 per pt so they are starting her on a low dose synthroid and she is to repeat the labs in 6 months. Pt says Dr. Andria Frames can call her.

## 2014-12-02 ENCOUNTER — Other Ambulatory Visit (INDEPENDENT_AMBULATORY_CARE_PROVIDER_SITE_OTHER): Payer: Medicare Other | Admitting: *Deleted

## 2014-12-02 DIAGNOSIS — Z79899 Other long term (current) drug therapy: Secondary | ICD-10-CM

## 2014-12-02 LAB — T3, FREE: T3, Free: 2.4 pg/mL (ref 2.3–4.2)

## 2014-12-02 LAB — T4, FREE: Free T4: 0.66 ng/dL — ABNORMAL LOW (ref 0.80–1.80)

## 2014-12-05 NOTE — Telephone Encounter (Signed)
Called and discussed.  Agree with current medications.

## 2014-12-05 NOTE — Telephone Encounter (Signed)
Will forward to MD. Jazmin Hartsell,CMA  

## 2014-12-12 NOTE — Telephone Encounter (Signed)
Spoke with patient on Friday, 10/28, evening.  Informed her I would send PCP results/info.  Faxed lab results and instructions to PCP.

## 2015-01-25 DIAGNOSIS — I10 Essential (primary) hypertension: Secondary | ICD-10-CM | POA: Diagnosis not present

## 2015-01-25 DIAGNOSIS — E876 Hypokalemia: Secondary | ICD-10-CM | POA: Diagnosis not present

## 2015-01-25 DIAGNOSIS — N183 Chronic kidney disease, stage 3 (moderate): Secondary | ICD-10-CM | POA: Diagnosis not present

## 2015-02-16 ENCOUNTER — Encounter: Payer: Self-pay | Admitting: Family Medicine

## 2015-02-16 ENCOUNTER — Telehealth: Payer: Self-pay | Admitting: Family Medicine

## 2015-02-16 ENCOUNTER — Ambulatory Visit (INDEPENDENT_AMBULATORY_CARE_PROVIDER_SITE_OTHER): Payer: Medicare Other | Admitting: Family Medicine

## 2015-02-16 ENCOUNTER — Telehealth: Payer: Self-pay | Admitting: *Deleted

## 2015-02-16 VITALS — BP 140/70 | HR 60 | Temp 98.4°F | Ht 61.0 in | Wt 134.0 lb

## 2015-02-16 DIAGNOSIS — R160 Hepatomegaly, not elsewhere classified: Secondary | ICD-10-CM | POA: Diagnosis not present

## 2015-02-16 DIAGNOSIS — M545 Low back pain: Secondary | ICD-10-CM | POA: Diagnosis present

## 2015-02-16 DIAGNOSIS — R1909 Other intra-abdominal and pelvic swelling, mass and lump: Secondary | ICD-10-CM

## 2015-02-16 DIAGNOSIS — A09 Infectious gastroenteritis and colitis, unspecified: Secondary | ICD-10-CM | POA: Diagnosis not present

## 2015-02-16 DIAGNOSIS — R1032 Left lower quadrant pain: Secondary | ICD-10-CM | POA: Diagnosis not present

## 2015-02-16 DIAGNOSIS — K529 Noninfective gastroenteritis and colitis, unspecified: Secondary | ICD-10-CM

## 2015-02-16 DIAGNOSIS — N179 Acute kidney failure, unspecified: Secondary | ICD-10-CM

## 2015-02-16 DIAGNOSIS — M5137 Other intervertebral disc degeneration, lumbosacral region: Secondary | ICD-10-CM

## 2015-02-16 DIAGNOSIS — R1031 Right lower quadrant pain: Secondary | ICD-10-CM | POA: Diagnosis not present

## 2015-02-16 LAB — COMPREHENSIVE METABOLIC PANEL
ALT: 11 U/L (ref 6–29)
AST: 14 U/L (ref 10–35)
Albumin: 4 g/dL (ref 3.6–5.1)
Alkaline Phosphatase: 111 U/L (ref 33–130)
BUN: 17 mg/dL (ref 7–25)
CO2: 25 mmol/L (ref 20–31)
Calcium: 9 mg/dL (ref 8.6–10.4)
Chloride: 95 mmol/L — ABNORMAL LOW (ref 98–110)
Creat: 1.33 mg/dL — ABNORMAL HIGH (ref 0.60–0.88)
Glucose, Bld: 95 mg/dL (ref 65–99)
Potassium: 3.4 mmol/L — ABNORMAL LOW (ref 3.5–5.3)
Sodium: 134 mmol/L — ABNORMAL LOW (ref 135–146)
Total Bilirubin: 0.5 mg/dL (ref 0.2–1.2)
Total Protein: 7.1 g/dL (ref 6.1–8.1)

## 2015-02-16 LAB — CBC
HCT: 32.5 % — ABNORMAL LOW (ref 36.0–46.0)
Hemoglobin: 11.3 g/dL — ABNORMAL LOW (ref 12.0–15.0)
MCH: 30.4 pg (ref 26.0–34.0)
MCHC: 34.8 g/dL (ref 30.0–36.0)
MCV: 87.4 fL (ref 78.0–100.0)
MPV: 8.2 fL — ABNORMAL LOW (ref 8.6–12.4)
Platelets: 280 10*3/uL (ref 150–400)
RBC: 3.72 MIL/uL — ABNORMAL LOW (ref 3.87–5.11)
RDW: 13.8 % (ref 11.5–15.5)
WBC: 8.4 10*3/uL (ref 4.0–10.5)

## 2015-02-16 LAB — POCT SEDIMENTATION RATE: POCT SED RATE: 36 mm/hr — AB (ref 0–22)

## 2015-02-16 MED ORDER — AMOXICILLIN 500 MG PO CAPS: 500.0000 mg | ORAL_CAPSULE | Freq: Three times a day (TID) | ORAL | Status: DC

## 2015-02-16 MED ORDER — HYDROCODONE-ACETAMINOPHEN 5-325 MG PO TABS: 1.0000 | ORAL_TABLET | Freq: Four times a day (QID) | ORAL | Status: DC | PRN

## 2015-02-16 MED ORDER — METRONIDAZOLE 500 MG PO TABS: 500.0000 mg | ORAL_TABLET | Freq: Three times a day (TID) | ORAL | Status: DC

## 2015-02-16 MED ORDER — CIPROFLOXACIN HCL 500 MG PO TABS: 500.0000 mg | ORAL_TABLET | Freq: Two times a day (BID) | ORAL | Status: DC

## 2015-02-16 NOTE — Patient Instructions (Signed)
I will call with test results Do not take the iron or calcium while on antibiotics I am treating you for colitis and regular back problems. The x ray and MRI is because of the cancer worry.

## 2015-02-16 NOTE — Telephone Encounter (Signed)
Received a fax from [facility] stating the Cipro may cause QT Prolongation with the amiodarone.  Requesting a change if appropriate .  Derl Barrow, RN

## 2015-02-16 NOTE — Telephone Encounter (Signed)
Phoned pharmacist and switched cipro to amox.

## 2015-02-16 NOTE — Assessment & Plan Note (Signed)
Recheck perihepatic mass

## 2015-02-16 NOTE — Telephone Encounter (Signed)
[facility] called and would like the doctor to change the orders in Epic to say with and without contrast. jw

## 2015-02-16 NOTE — Assessment & Plan Note (Signed)
Follow up of liver mass

## 2015-02-17 DIAGNOSIS — K529 Noninfective gastroenteritis and colitis, unspecified: Secondary | ICD-10-CM | POA: Insufficient documentation

## 2015-02-17 NOTE — Assessment & Plan Note (Signed)
Nicely improving.  She will tolerate contrast with MRI.

## 2015-02-17 NOTE — Assessment & Plan Note (Signed)
Uncertain whether benign or malignant and how it relates to her current symptoms.  Will get repeat MRI.

## 2015-02-17 NOTE — Progress Notes (Signed)
   Subjective:    Patient ID: Sara Russell, female    DOB: 02-20-1930, 82 y.o.   MRN: [id number]  HPI Acute problem is back pain for two weeks.  Complex patient and story. Last fall in hospital with what was thought to be a new perihepatic cancer and acute renal failure.  She chose non surgical approach and has done very well.  She was improving and had a visit to renal MD mid Dec with a Creat down to 1.5.  For the past two weeks, she has done worse.  Started with increasing back pain.  She has known DDD.  No trauma or unusual activity.  She has also had a couple of spells of significant diarrhea over the same period which are similar to her previous episodes of colitis.  The back pain continues and seemed to wrap around and give anterior abd pain first in right lower quadrent and now in both lower quadrents.  No nausea of vomiting.  Some decreased appetite.  No chest pain, swelling or SOB.  No documented fever.  No noted GI bleeding.    Review of Systems     Objective:   Physical ExamLungs clear Cardiac without m or g Abd diffuse lower abd tenderness, no rebound Ext no edema        Assessment & Plan:

## 2015-02-17 NOTE — Assessment & Plan Note (Signed)
Will repeat plain films of the spine due to the consideration of neoplastic disease.

## 2015-02-17 NOTE — Assessment & Plan Note (Signed)
Will treat as infectious colitis while working up with MRI.

## 2015-02-21 ENCOUNTER — Telehealth: Payer: Self-pay | Admitting: Family Medicine

## 2015-02-21 NOTE — Telephone Encounter (Signed)
Pt is wanting to change from taking Amoxicillin to something else since she is having diarrhea. jw

## 2015-02-21 NOTE — Telephone Encounter (Signed)
Day 5 of antibiotics.  Diarrhea is frequent and non bloody.  Told to stop amox and continue Flagyl.  MRI scheduled in 2 days.  I will call later this week to give results and check on symptoms.

## 2015-02-23 ENCOUNTER — Ambulatory Visit
Admission: RE | Admit: 2015-02-23 | Discharge: 2015-02-23 | Disposition: A | Discharge status: Home / Self Care | Payer: Medicare Other | Source: Ambulatory Visit | Attending: Family Medicine | Admitting: Family Medicine

## 2015-02-23 ENCOUNTER — Encounter: Payer: Self-pay | Admitting: Family Medicine

## 2015-02-23 DIAGNOSIS — M5137 Other intervertebral disc degeneration, lumbosacral region: Secondary | ICD-10-CM

## 2015-02-23 DIAGNOSIS — S32010A Wedge compression fracture of first lumbar vertebra, initial encounter for closed fracture: Secondary | ICD-10-CM | POA: Insufficient documentation

## 2015-02-23 DIAGNOSIS — M5136 Other intervertebral disc degeneration, lumbar region: Secondary | ICD-10-CM | POA: Diagnosis not present

## 2015-02-23 DIAGNOSIS — R160 Hepatomegaly, not elsewhere classified: Secondary | ICD-10-CM

## 2015-02-23 DIAGNOSIS — K8050 Calculus of bile duct without cholangitis or cholecystitis without obstruction: Secondary | ICD-10-CM | POA: Diagnosis not present

## 2015-02-23 DIAGNOSIS — N281 Cyst of kidney, acquired: Secondary | ICD-10-CM | POA: Diagnosis not present

## 2015-02-23 DIAGNOSIS — K8020 Calculus of gallbladder without cholecystitis without obstruction: Secondary | ICD-10-CM | POA: Insufficient documentation

## 2015-02-23 MED ORDER — GADOBENATE DIMEGLUMINE 529 MG/ML IV SOLN
6.0000 mL | Freq: Once | INTRAVENOUS | Status: AC | PRN
  Administered 2015-02-23: 6 mL via INTRAVENOUS

## 2015-02-23 NOTE — Progress Notes (Signed)
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

## 2015-02-23 NOTE — Progress Notes (Signed)
Patient ID: Sara Russell, female   DOB: 1930/10/25, 82 y.o.   MRN: [id number] Called and informed 1. Liver mass RESOLVED - [place] 2. Choledocholitiasis, needs GI referral and ERCP 3. Acute or sub acute compression fracture of L1.  Needs to see me to discuss further.  This was a fun phone call to make.

## 2015-02-23 NOTE — Assessment & Plan Note (Signed)
Likely cause of back pain.  Informed.  She will make an appointment to see me to discuss therapy.

## 2015-02-23 NOTE — Assessment & Plan Note (Signed)
Gi referral

## 2015-02-23 NOTE — Progress Notes (Signed)
Bill thanks for sharing this is AWESOME news. I have had too much of the other type recently. Thanks for sharing!

## 2015-03-01 ENCOUNTER — Encounter: Payer: Self-pay | Admitting: Family Medicine

## 2015-03-01 ENCOUNTER — Ambulatory Visit (INDEPENDENT_AMBULATORY_CARE_PROVIDER_SITE_OTHER): Payer: Medicare Other | Admitting: Family Medicine

## 2015-03-01 VITALS — BP 153/71 | HR 63 | Temp 99.2°F | Ht 61.0 in | Wt 132.4 lb

## 2015-03-01 DIAGNOSIS — R1032 Left lower quadrant pain: Secondary | ICD-10-CM | POA: Diagnosis not present

## 2015-03-01 DIAGNOSIS — R1031 Right lower quadrant pain: Secondary | ICD-10-CM

## 2015-03-01 DIAGNOSIS — A09 Infectious gastroenteritis and colitis, unspecified: Secondary | ICD-10-CM

## 2015-03-01 DIAGNOSIS — S32010S Wedge compression fracture of first lumbar vertebra, sequela: Secondary | ICD-10-CM | POA: Diagnosis not present

## 2015-03-01 DIAGNOSIS — M810 Age-related osteoporosis without current pathological fracture: Secondary | ICD-10-CM | POA: Diagnosis not present

## 2015-03-01 DIAGNOSIS — K529 Noninfective gastroenteritis and colitis, unspecified: Secondary | ICD-10-CM

## 2015-03-01 MED ORDER — HYDROCODONE-ACETAMINOPHEN 5-325 MG PO TABS: 1.0000 | ORAL_TABLET | Freq: Four times a day (QID) | ORAL | Status: DC | PRN

## 2015-03-01 NOTE — Assessment & Plan Note (Signed)
Will refill narcotic pain med

## 2015-03-01 NOTE — Patient Instructions (Addendum)
Get back on the calcium and iron. Start taking the calcium twice a day. Take your pain medicine 4 times per day.  Please get activia yogurt.  It is a good probiotic and may help the diarrhea.   I will call with the c diff test result You likely need to be on medications for osteoporosis to prevent another compression fracture, but we will put that off for now.  I want to get you on iv Boniva once we are done with this other stuff. Stay in touch.

## 2015-03-02 NOTE — Progress Notes (Signed)
   Subjective:    Patient ID: Sara Russell, female    DOB: 11/03/1930, 82 y.o.   MRN: [id number]  HPI  Great news on follow up - but still lingering problems. 1. MRI showed resolution of mass concerning for malignancy last fall.  Wow. 2. MRI shows choledocholithasis and GB sludge.  She has an appointment with GI for further evaluation.  Anticipate that she will need ERCP with stone extraction. 3. Diarrhea - started prior to last visit and place on flagyl and amp (not cipro due to QT issue)  Diarrhea actually worsened on amp and stopped early.  Finished course of flagyl.  No blood.  Now perhaps 10 stools per day.  No mention of colitis on MRI of abd.   4. Acute compression fracture of L1 causing back pain.  She is coping OK with pain meds.  Has known osteopenia from previous dexa scan.  Non traumatic fracture pushes her into osteoporosis realm.      Review of Systems     Objective:   Physical Exam VS noted.  Weight down two more pounds. Lungs clear Cardiac RRR without m or g Abd, mild epigastric tenderness.         Assessment & Plan:

## 2015-03-02 NOTE — Assessment & Plan Note (Addendum)
Cannot take PO bisphosphanates due to esophageal problem.  Back burner issue for now.  Will start on iv Boniva when acute problems are resolved.  Increase calcium to bid.

## 2015-03-02 NOTE — Assessment & Plan Note (Signed)
Check c diff.  Add probiotic.

## 2015-03-07 ENCOUNTER — Telehealth: Payer: Self-pay | Admitting: Internal Medicine

## 2015-03-07 ENCOUNTER — Encounter: Payer: Self-pay | Admitting: Gastroenterology

## 2015-03-07 ENCOUNTER — Other Ambulatory Visit (INDEPENDENT_AMBULATORY_CARE_PROVIDER_SITE_OTHER): Payer: Medicare Other

## 2015-03-07 ENCOUNTER — Ambulatory Visit (INDEPENDENT_AMBULATORY_CARE_PROVIDER_SITE_OTHER): Payer: Medicare Other | Admitting: Gastroenterology

## 2015-03-07 VITALS — BP 124/62 | HR 68 | Ht 62.5 in | Wt 134.0 lb

## 2015-03-07 DIAGNOSIS — K8050 Calculus of bile duct without cholangitis or cholecystitis without obstruction: Secondary | ICD-10-CM

## 2015-03-07 LAB — COMPREHENSIVE METABOLIC PANEL
ALT: 11 U/L (ref 0–35)
AST: 15 U/L (ref 0–37)
Albumin: 3.8 g/dL (ref 3.5–5.2)
Alkaline Phosphatase: 96 U/L (ref 39–117)
BUN: 24 mg/dL — ABNORMAL HIGH (ref 6–23)
CO2: 29 mEq/L (ref 19–32)
Calcium: 8.9 mg/dL (ref 8.4–10.5)
Chloride: 92 mEq/L — ABNORMAL LOW (ref 96–112)
Creatinine, Ser: 1.45 mg/dL — ABNORMAL HIGH (ref 0.40–1.20)
GFR: 36.69 mL/min — ABNORMAL LOW (ref >60.00)
Glucose, Bld: 111 mg/dL — ABNORMAL HIGH (ref 70–99)
Potassium: 3.3 mEq/L — ABNORMAL LOW (ref 3.5–5.1)
Sodium: 131 mEq/L — ABNORMAL LOW (ref 135–145)
Total Bilirubin: 0.4 mg/dL (ref 0.2–1.2)
Total Protein: 7 g/dL (ref 6.0–8.3)

## 2015-03-07 NOTE — Patient Instructions (Signed)
We will contact you to schedule your procedure and tell you when to hold Eliquis

## 2015-03-07 NOTE — Telephone Encounter (Signed)
Genella Mech, surgery scheduler at [facility], calling to obtain medication clearance (Eliquis), and cardiac clearance (if needed), by Dr [name], for the pt to undergo ERCP to remove a bowel duct stone.  Per Shirlean Mylar this procedure has not been scheduled yet, but they ideally would like to schedule this for the pt for this Friday with Dr. Trixie Dredge.  Per Shirlean Mylar, the pt has to be cleared from her Eliquis for this procedure.   Per Shirlean Mylar, if Dr [name] recommends that the pt needs to have cardiac clearance and medication clearance, then they would need to schedule this for sometime early next week, with Dr Owens Loffler.  Shirlean Mylar unsure if the pt will require both cardiac and medication clearance.  Per Shirlean Mylar, if the pt will only require clearance from her Eliquis, then this will need to be advised on tomorrow morning at the latest.  Informed Robin that both Dr [name] and his nurse are out of the office today, but will return back tomorrow.  Informed Shirlean Mylar that at this point, with not knowing if the pt will need both cardiac and medication (Eliquis) clearance, all we can do is route this message to Dr [name] to review and advise on this, and we will follow-up with their office shortly thereafter.  Advised Shirlean Mylar that if she hasn't heard anything by mid-morning tomorrow, then she should contact our office back for further follow-up.  Robin verbalized understanding and agrees with this plan.

## 2015-03-07 NOTE — Progress Notes (Signed)
Sara Russell    [id number]    1930-11-29  Primary Care Physician:HENSEL,WILLIAM Arnell Sieving, MD  Referring Physician: Zenia Resides, MD [address] [place], [place] [address]  Chief complaint:   choledocholithiasis HPI: 82 year old white female, retired Marine scientist with findings suggestive of choledocholithiasis on MRI abdomen here for follow-up visit.  She was followed by Dr. Olevia Perches previously for lymphocytic colitis diagnosed on flexible sigmoidoscopy in April 2012,  Chronic GERD and history of esophageal stricture status post dilation in 1992 and also has history of chronic iron deficiency anemia. She had a  MRI abdomen in August 2016 which showed a 2.5 cm well-circumscribed T2 hyperintense mass located between theintrahepatic IVC and right adrenal gland, exact organ of origin unclear. Primary differential considerations include pheochromocytoma, sarcoma, or possibly metastasis as per the radiologist. Follow-up MRI abdomen  2 weeks ago  Showed complete resolution of the previously noted mass like area and has findings suggestive of choledocholithiasis and pancreatic side branch IPMN.  She denies any abdominal pain or jaundice.  She had a compression fracture of  L1 last month  and has back pain. Denies any nausea, vomiting,  melena or bright red blood per rectum.  LFT are within normal limits.     Outpatient Encounter Prescriptions as of 03/07/2015  Medication Sig  . amiodarone (PACERONE) 200 MG tablet Take 1 tablet (200 mg total) by mouth daily.  Marland Kitchen apixaban (ELIQUIS) 2.5 MG TABS tablet Take 1 tablet (2.5 mg total) by mouth 2 (two) times daily.  Marland Kitchen atorvastatin (LIPITOR) 10 MG tablet TAKE ONE TABLET BY MOUTH ONCE DAILY AT  6PM  . calcium-vitamin D (SM CALCIUM-VITAMIN D) 500-200 MG-UNIT per tablet Take 1 tablet by mouth 2 (two) times daily.   . carvedilol (COREG) 6.25 MG tablet Take 1 tablet (6.25 mg total) by mouth 2 (two) times daily with a meal.  . furosemide (LASIX) 20  MG tablet Take 1 tablet (20 mg total) by mouth daily.  Marland Kitchen HYDROcodone-acetaminophen (NORCO/VICODIN) 5-325 MG tablet Take 1 tablet by mouth every 6 (six) hours as needed for moderate pain.  Marland Kitchen levothyroxine (LEVOTHROID) 25 MCG tablet Take 1 tablet (25 mcg total) by mouth daily before breakfast.  . omeprazole (PRILOSEC) 40 MG capsule Take 1 capsule (40 mg total) by mouth daily.  . potassium chloride (K-DUR,KLOR-CON) 10 MEQ tablet Take 10 mEq by mouth daily.  Marland Kitchen saccharomyces boulardii (FLORASTOR) 250 MG capsule Take 1 capsule (250 mg total) by mouth 2 (two) times daily.  . [DISCONTINUED] acetaminophen (TYLENOL) 650 MG CR tablet Take 650 mg by mouth every 8 (eight) hours as needed for pain.  . [DISCONTINUED] amoxicillin (AMOXIL) 500 MG capsule Take 1 capsule (500 mg total) by mouth 3 (three) times daily.  . [DISCONTINUED] ferrous sulfate 325 (65 FE) MG tablet Take 1 tablet (325 mg total) by mouth daily with breakfast.  . [DISCONTINUED] metroNIDAZOLE (FLAGYL) 500 MG tablet Take 1 tablet (500 mg total) by mouth 3 (three) times daily.  . [DISCONTINUED] polycarbophil (FIBERCON) 625 MG tablet Take 1 tablet (625 mg total) by mouth 2 (two) times daily.   No facility-administered encounter medications on file as of 03/07/2015.    Allergies as of 03/07/2015 - Review Complete 03/07/2015  Allergen Reaction Noted  . Iohexol Other (See Comments) 08/07/2009  . Ivp dye [iodinated diagnostic agents] Hives 12/16/2010  . Scallops [shellfish allergy] Other (See Comments) 12/16/2010  . Sulfa antibiotics Hives 12/16/2010  . Sulfamethoxazole Hives 02/19/2006  Past Medical History  Diagnosis Date  . S/P dilatation of esophageal stricture 2002  . Hypertension   . Hyperlipidemia   . DVT (deep venous thrombosis) ([place]) 2009  . PE (pulmonary embolism) 2009  . GERD (gastroesophageal reflux disease)   . Diverticulosis   . Iron deficiency anemia   . Hyperplastic colon polyp   . Positive H. pylori test 01/20/96  .  Degenerative joint disease   . Skin cancer     h/o basal and squamous skin cancer--> removed  . Persistent atrial fibrillation ([place])   . Shortness of breath dyspnea   . AKI (acute kidney injury) ([place]) 09/15/2014  . Colitis, ulcerative ([place])   . Abdominal mass   . Atrial fibrillation [facility])     Past Surgical History  Procedure Laterality Date  . Dilation and curettage of uterus      for endometrial polyps  . Replacement total knee      left  . Tonsillectomy    . Appendectomy    . Breast lumpectomy      benign  . Joint replacement      Family History  Problem Relation Age of Onset  . Esophageal cancer Brother   . Diverticulitis Brother   . Colon cancer Neg Hx   . Heart disease Mother   . Stroke Mother     Social History   Social History  . Marital Status: Widowed    Spouse Name: N/A  . Number of Children: 3  . Years of Education: college   Occupational History  . retired Marine scientist    Social History Main Topics  . Smoking status: Former Smoker -- 0.50 packs/day for 13 years    Types: Cigarettes    Start date: 02/12/1948    Quit date: 02/11/1961  . Smokeless tobacco: Never Used     Comment: Denies use of tobacco since 1963  . Alcohol Use: 0.0 oz/week    7-14 Glasses of wine per week     Comment: 1-2 glasses of wine per day  . Drug Use: No  . Sexual Activity: Not Currently   Other Topics Concern  . Not on file   Social History Narrative   Health Care POA:    Emergency Contact: son, Camrynn Mcclintic (c) [phone] [name])   End of Life Plan:    Who lives with you: self   Any pets: none   Diet: Pt has a varied diet of protein, starch, vegetables.   Exercise: Pt has no regular exercise plan because of pain in back.   Seatbelts: Pt reports wearing seatbelt when in vehicle.   Nancy Fetter Exposure/Protection: Pt reports wearing sun screen.   Hobbies: reading, traveling, eating out.      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  HENT: Negative.   Eyes: Negative for blurred vision.  Respiratory: Negative for cough, shortness of breath and wheezing.   Cardiovascular: Negative for chest pain and palpitations.  Gastrointestinal: as per HPI Genitourinary: Negative for dysuria, urgency, frequency and hematuria.  Musculoskeletal: Negative for myalgias, back pain and joint pain.  Skin: Negative for itching and rash.  Neurological: Negative for dizziness, tremors, focal weakness, seizures and loss of consciousness.  Endo/Heme/Allergies: Negative for environmental allergies.  Psychiatric/Behavioral: Negative for depression, suicidal  ideas and hallucinations.  All other systems reviewed and are negative.   Physical Exam: Filed Vitals:   03/07/15 1445  BP: 124/62  Pulse: 68   Gen:      No acute distress,  Scoliosis and kyphosis HEENT:  EOMI, sclera anicteric Neck:     No masses; no thyromegaly Lungs:    Clear to auscultation bilaterally; normal respiratory effort CV:         Regular rate and rhythm; no murmurs Abd:      + bowel sounds; soft, non-tender; no palpable masses, no distension Ext:    No edema; adequate peripheral perfusion Skin:      Warm and dry; no rash Neuro: alert and oriented x 3 Psych: normal mood and affect  Data Reviewed:  MRI abdomen 02/23/2015 Complete resolution of the previously noted mass-like area between the right adrenal gland and intrahepatic IVC. This remains of uncertain etiology and significance, but was presumably a benign finding (potentially posttraumatic hemorrhage or a spontaneous adrenal hemorrhage given the prior history of fall) on prior examinations. No findings on today's study to suggest neoplasm at this time. 2. Choledocholithiasis with extra hepatic biliary ductal dilatation and minimal intrahepatic  biliary ductal dilatation, as above. Correlation with ERCP for stone extraction should be considered if clinically appropriate. 3. In addition, there is a large amount of biliary sludge and/or gallstones in the gallbladder. No current findings to suggest an acute cholecystitis at this time. 4. Large hiatal hernia. 5. 3.1 cm simple cyst in the left kidney incidentally noted. 6. Additional incidental findings, as above.    Assessment and Plan/Recommendations:  82 year old female with incidental finding of choledocholithiasis on MRI abdomen.  She is currently asymptomatic and has normal LFT.  No evidence of biliary obstruction or cholangitis.  we'll schedule for  Elective ERCP with stone extraction,  have to coordinate schedule  and availability of my colleagues that do ERCP She is on chronic anticoagulation for A. Fib  We'll discuss with cardiology if Eliquis could be held for the procedure  we'll recheck BMP and LFT  K. Denzil Magnuson , MD [phone] Mon-Fri 8a-5p [phone] after 5p, weekends, holidays

## 2015-03-07 NOTE — Telephone Encounter (Signed)
New message    Request for surgical clearance:  1. What type of surgery is being performed? ERCP remove a bowel duct stone   2. When is this surgery scheduled? Pending this week    3. Are there any medications that need to be held prior to surgery and how long? eliquis  Need to be off this week    4. Name of physician performing surgery? Dr. Trixie Dredge   5. What is your office phone and fax number? Fax # [phone]

## 2015-03-08 ENCOUNTER — Telehealth: Payer: Self-pay

## 2015-03-08 ENCOUNTER — Telehealth: Payer: Self-pay | Admitting: Family Medicine

## 2015-03-08 ENCOUNTER — Other Ambulatory Visit: Payer: Self-pay

## 2015-03-08 DIAGNOSIS — K8050 Calculus of bile duct without cholangitis or cholecystitis without obstruction: Secondary | ICD-10-CM

## 2015-03-08 NOTE — Telephone Encounter (Signed)
Discussed with Dr [name], may proceed with procedure and hold Eliquis for 48 hours prior to procedure

## 2015-03-08 NOTE — Telephone Encounter (Signed)
[name], RN at 03/08/2015 10:07 AM     Status: Signed       Expand All Collapse All   Discussed with Dr [name], may proceed with procedure and hold Eliquis for 48 hours prior to procedure

## 2015-03-08 NOTE — Telephone Encounter (Signed)
Pt calling to inform PCP that she is going to have ERCP done on 03/23/15 with gastroenterologist; she has to stop eliquis 48 hours before, and she had lab work done yesterday and results should be in soon. Sara Russell, ASA

## 2015-03-08 NOTE — Telephone Encounter (Signed)
ERCP scheduled, pt instructed and medications reviewed.  Patient instructions mailed to home.  Patient to call with any questions or concerns.

## 2015-03-08 NOTE — Telephone Encounter (Signed)
Pt has been scheduled for 03/23/15 at 1:15 pm

## 2015-03-08 NOTE — Telephone Encounter (Signed)
Noted  

## 2015-03-08 NOTE — Telephone Encounter (Signed)
See alternate note

## 2015-03-08 NOTE — Telephone Encounter (Signed)
-----   Message from Milus Banister, MD sent at 03/07/2015  7:43 PM EST ----- Sara Russell,   Happy to help but prefer not to do out patient cases during my inpatient week unless really necessary. Since these stones are incidental it can probably wait if that is ok with you.  I'll send word to [name] to get her on for ERCP during my next (non-inpatient) Thursday. Thanks  Lovett Coffin, See above, she needs ERCP, next available EUS Thursday with anesthesia assistance.  For CBD stones.  thanks  Veleta Miners   ----- Message -----    From: Mauri Pole, MD    Sent: 03/07/2015   5:15 PM      To: Milus Banister, MD, Oda Kilts, [place],  I saw this patient today in officewith incidental  Finding of choledocholithiasis on MRI abdomen.  She is asymptomatic with no evidence of biliary obstruction,  Normal LFT.  She is on chronic anticoagulation Eliquis,  We contacted cardiology office to see if that could be held.  Can you please review her chart and see if you could do her ERCP next week during your inpatient block  Thanks [facility]

## 2015-03-08 NOTE — Telephone Encounter (Signed)
Will forward to MD to make him aware. Eshal Propps,CMA  

## 2015-03-10 ENCOUNTER — Ambulatory Visit (HOSPITAL_COMMUNITY): Admission: RE | Admit: 2015-03-10 | Payer: Medicare Other | Source: Ambulatory Visit | Admitting: Gastroenterology

## 2015-03-10 ENCOUNTER — Encounter (HOSPITAL_COMMUNITY): Admission: RE | Payer: Self-pay | Source: Ambulatory Visit

## 2015-03-10 SURGERY — ERCP, WITH INTERVENTION IF INDICATED
Anesthesia: Moderate Sedation

## 2015-03-13 ENCOUNTER — Encounter (HOSPITAL_COMMUNITY): Payer: Self-pay | Admitting: *Deleted

## 2015-03-20 ENCOUNTER — Telehealth: Payer: Self-pay | Admitting: Family Medicine

## 2015-03-20 DIAGNOSIS — I10 Essential (primary) hypertension: Secondary | ICD-10-CM

## 2015-03-20 DIAGNOSIS — D500 Iron deficiency anemia secondary to blood loss (chronic): Secondary | ICD-10-CM

## 2015-03-20 NOTE — Telephone Encounter (Signed)
Called and discussed

## 2015-03-20 NOTE — Telephone Encounter (Signed)
Will forward to MD. Frederika Hukill,CMA  

## 2015-03-20 NOTE — Telephone Encounter (Signed)
Pt called because she is having a ERCP coming up and she is wanting to see if Dr. Andria Frames thinks she should have more labs done to check her levels, and she is also concerned because several years ago she had a cyst removed from her vocal cords and the back of her tongue. They told her since they could not get to the core of the cyst on her vocal cords it could grow back. She is wanting a referral or have someone take a look at this to make sure she does not have any issues. Please call to discuss. jw

## 2015-03-21 ENCOUNTER — Other Ambulatory Visit: Payer: Medicare Other

## 2015-03-21 DIAGNOSIS — I10 Essential (primary) hypertension: Secondary | ICD-10-CM

## 2015-03-21 DIAGNOSIS — D500 Iron deficiency anemia secondary to blood loss (chronic): Secondary | ICD-10-CM | POA: Diagnosis not present

## 2015-03-21 LAB — CBC
HCT: 32.5 % — ABNORMAL LOW (ref 36.0–46.0)
Hemoglobin: 11.1 g/dL — ABNORMAL LOW (ref 12.0–15.0)
MCH: 30.4 pg (ref 26.0–34.0)
MCHC: 34.2 g/dL (ref 30.0–36.0)
MCV: 89 fL (ref 78.0–100.0)
MPV: 8 fL — ABNORMAL LOW (ref 8.6–12.4)
Platelets: 272 10*3/uL (ref 150–400)
RBC: 3.65 MIL/uL — ABNORMAL LOW (ref 3.87–5.11)
RDW: 13.3 % (ref 11.5–15.5)
WBC: 6.1 10*3/uL (ref 4.0–10.5)

## 2015-03-21 LAB — BASIC METABOLIC PANEL
BUN: 16 mg/dL (ref 7–25)
CO2: 26 mmol/L (ref 20–31)
Calcium: 8.5 mg/dL — ABNORMAL LOW (ref 8.6–10.4)
Chloride: 97 mmol/L — ABNORMAL LOW (ref 98–110)
Creat: 1.39 mg/dL — ABNORMAL HIGH (ref 0.60–0.88)
Glucose, Bld: 101 mg/dL — ABNORMAL HIGH (ref 65–99)
Potassium: 3.6 mmol/L (ref 3.5–5.3)
Sodium: 134 mmol/L — ABNORMAL LOW (ref 135–146)

## 2015-03-21 NOTE — Progress Notes (Signed)
Bmp and cbc done today [facility] Sara Russell

## 2015-03-23 ENCOUNTER — Ambulatory Visit (HOSPITAL_COMMUNITY)
Admission: RE | Admit: 2015-03-23 | Discharge: 2015-03-23 | Disposition: A | Discharge status: Home / Self Care | Payer: Medicare Other | Source: Ambulatory Visit | Attending: Gastroenterology | Admitting: Gastroenterology

## 2015-03-23 ENCOUNTER — Encounter (HOSPITAL_COMMUNITY)
Admission: RE | Disposition: A | Discharge status: Home / Self Care | Payer: Self-pay | Source: Ambulatory Visit | Attending: Gastroenterology

## 2015-03-23 ENCOUNTER — Ambulatory Visit (HOSPITAL_COMMUNITY): Payer: Medicare Other | Admitting: Anesthesiology

## 2015-03-23 ENCOUNTER — Telehealth: Payer: Self-pay

## 2015-03-23 ENCOUNTER — Encounter (HOSPITAL_COMMUNITY): Payer: Self-pay | Admitting: Registered Nurse

## 2015-03-23 ENCOUNTER — Ambulatory Visit (HOSPITAL_COMMUNITY): Payer: Medicare Other

## 2015-03-23 DIAGNOSIS — Z87891 Personal history of nicotine dependence: Secondary | ICD-10-CM | POA: Diagnosis not present

## 2015-03-23 DIAGNOSIS — Z96659 Presence of unspecified artificial knee joint: Secondary | ICD-10-CM | POA: Diagnosis not present

## 2015-03-23 DIAGNOSIS — Z79899 Other long term (current) drug therapy: Secondary | ICD-10-CM | POA: Insufficient documentation

## 2015-03-23 DIAGNOSIS — Z9049 Acquired absence of other specified parts of digestive tract: Secondary | ICD-10-CM | POA: Insufficient documentation

## 2015-03-23 DIAGNOSIS — I481 Persistent atrial fibrillation: Secondary | ICD-10-CM | POA: Diagnosis not present

## 2015-03-23 DIAGNOSIS — Z86711 Personal history of pulmonary embolism: Secondary | ICD-10-CM | POA: Insufficient documentation

## 2015-03-23 DIAGNOSIS — Z7901 Long term (current) use of anticoagulants: Secondary | ICD-10-CM | POA: Diagnosis not present

## 2015-03-23 DIAGNOSIS — K219 Gastro-esophageal reflux disease without esophagitis: Secondary | ICD-10-CM | POA: Diagnosis not present

## 2015-03-23 DIAGNOSIS — M1990 Unspecified osteoarthritis, unspecified site: Secondary | ICD-10-CM | POA: Diagnosis not present

## 2015-03-23 DIAGNOSIS — I10 Essential (primary) hypertension: Secondary | ICD-10-CM | POA: Diagnosis not present

## 2015-03-23 DIAGNOSIS — K8040 Calculus of bile duct with cholecystitis, unspecified, without obstruction: Secondary | ICD-10-CM | POA: Diagnosis not present

## 2015-03-23 DIAGNOSIS — K8050 Calculus of bile duct without cholangitis or cholecystitis without obstruction: Secondary | ICD-10-CM | POA: Diagnosis not present

## 2015-03-23 DIAGNOSIS — Z86718 Personal history of other venous thrombosis and embolism: Secondary | ICD-10-CM | POA: Insufficient documentation

## 2015-03-23 DIAGNOSIS — K8070 Calculus of gallbladder and bile duct without cholecystitis without obstruction: Secondary | ICD-10-CM | POA: Insufficient documentation

## 2015-03-23 DIAGNOSIS — I110 Hypertensive heart disease with heart failure: Secondary | ICD-10-CM | POA: Insufficient documentation

## 2015-03-23 DIAGNOSIS — I5032 Chronic diastolic (congestive) heart failure: Secondary | ICD-10-CM | POA: Insufficient documentation

## 2015-03-23 DIAGNOSIS — Z86010 Personal history of colonic polyps: Secondary | ICD-10-CM | POA: Diagnosis not present

## 2015-03-23 DIAGNOSIS — Z8711 Personal history of peptic ulcer disease: Secondary | ICD-10-CM | POA: Diagnosis not present

## 2015-03-23 DIAGNOSIS — Z85828 Personal history of other malignant neoplasm of skin: Secondary | ICD-10-CM | POA: Insufficient documentation

## 2015-03-23 DIAGNOSIS — E785 Hyperlipidemia, unspecified: Secondary | ICD-10-CM | POA: Insufficient documentation

## 2015-03-23 DIAGNOSIS — I509 Heart failure, unspecified: Secondary | ICD-10-CM | POA: Diagnosis not present

## 2015-03-23 HISTORY — DX: Cough: R05

## 2015-03-23 HISTORY — DX: Hypothyroidism, unspecified: E03.9

## 2015-03-23 HISTORY — DX: Cough, unspecified: R05.9

## 2015-03-23 HISTORY — PX: ENDOSCOPIC RETROGRADE CHOLANGIOPANCREATOGRAPHY (ERCP) WITH PROPOFOL: SHX5810

## 2015-03-23 SURGERY — ENDOSCOPIC RETROGRADE CHOLANGIOPANCREATOGRAPHY (ERCP) WITH PROPOFOL
Anesthesia: General

## 2015-03-23 MED ORDER — ONDANSETRON HCL 4 MG/2ML IJ SOLN
INTRAMUSCULAR | Status: DC | PRN
  Administered 2015-03-23: 4 mg via INTRAVENOUS

## 2015-03-23 MED ORDER — NEOSTIGMINE METHYLSULFATE 10 MG/10ML IV SOLN
INTRAVENOUS | Status: AC
  Filled 2015-03-23: qty 1

## 2015-03-23 MED ORDER — LIDOCAINE HCL (CARDIAC) 20 MG/ML IV SOLN
INTRAVENOUS | Status: AC
  Filled 2015-03-23: qty 5

## 2015-03-23 MED ORDER — FENTANYL CITRATE (PF) 100 MCG/2ML IJ SOLN
INTRAMUSCULAR | Status: DC | PRN
Start: 2015-03-23 — End: 2015-03-23
  Administered 2015-03-23: 50 ug via INTRAVENOUS

## 2015-03-23 MED ORDER — FENTANYL CITRATE (PF) 100 MCG/2ML IJ SOLN
INTRAMUSCULAR | Status: AC
  Filled 2015-03-23: qty 2

## 2015-03-23 MED ORDER — INDOMETHACIN 50 MG RE SUPP
100.0000 mg | Freq: Once | RECTAL | Status: AC
  Administered 2015-03-23: 100 mg via RECTAL

## 2015-03-23 MED ORDER — PROPOFOL 10 MG/ML IV BOLUS
INTRAVENOUS | Status: AC
  Filled 2015-03-23: qty 20

## 2015-03-23 MED ORDER — GLYCOPYRROLATE 0.2 MG/ML IJ SOLN
INTRAMUSCULAR | Status: AC
  Filled 2015-03-23: qty 2

## 2015-03-23 MED ORDER — LIDOCAINE HCL (CARDIAC) 20 MG/ML IV SOLN
INTRAVENOUS | Status: DC | PRN
  Administered 2015-03-23: 80 mg via INTRAVENOUS

## 2015-03-23 MED ORDER — ROCURONIUM BROMIDE 100 MG/10ML IV SOLN
INTRAVENOUS | Status: DC | PRN
  Administered 2015-03-23: 20 mg via INTRAVENOUS

## 2015-03-23 MED ORDER — GLYCOPYRROLATE 0.2 MG/ML IJ SOLN
INTRAMUSCULAR | Status: DC | PRN
  Administered 2015-03-23: .4 mg via INTRAVENOUS

## 2015-03-23 MED ORDER — CIPROFLOXACIN IN D5W 400 MG/200ML IV SOLN
INTRAVENOUS | Status: DC | PRN
  Administered 2015-03-23: 400 mg via INTRAVENOUS

## 2015-03-23 MED ORDER — EPHEDRINE SULFATE 50 MG/ML IJ SOLN
INTRAMUSCULAR | Status: DC | PRN
  Administered 2015-03-23: 5 mg via INTRAVENOUS

## 2015-03-23 MED ORDER — DEXAMETHASONE SODIUM PHOSPHATE 10 MG/ML IJ SOLN
INTRAMUSCULAR | Status: AC
  Filled 2015-03-23: qty 1

## 2015-03-23 MED ORDER — INDOMETHACIN 50 MG RE SUPP
RECTAL | Status: AC
  Filled 2015-03-23: qty 2

## 2015-03-23 MED ORDER — LACTATED RINGERS IV SOLN
INTRAVENOUS | Status: DC | PRN
  Administered 2015-03-23: 12:00:00 via INTRAVENOUS

## 2015-03-23 MED ORDER — CIPROFLOXACIN IN D5W 400 MG/200ML IV SOLN
INTRAVENOUS | Status: AC
  Filled 2015-03-23: qty 200

## 2015-03-23 MED ORDER — NEOSTIGMINE METHYLSULFATE 10 MG/10ML IV SOLN
INTRAVENOUS | Status: DC | PRN
  Administered 2015-03-23: 3 mg via INTRAVENOUS

## 2015-03-23 MED ORDER — GLUCAGON HCL RDNA (DIAGNOSTIC) 1 MG IJ SOLR
INTRAMUSCULAR | Status: AC
  Filled 2015-03-23: qty 1

## 2015-03-23 MED ORDER — SUGAMMADEX SODIUM 200 MG/2ML IV SOLN
INTRAVENOUS | Status: AC
  Filled 2015-03-23: qty 2

## 2015-03-23 MED ORDER — DEXAMETHASONE SODIUM PHOSPHATE 10 MG/ML IJ SOLN
INTRAMUSCULAR | Status: DC | PRN
Start: 2015-03-23 — End: 2015-03-23
  Administered 2015-03-23: 10 mg via INTRAVENOUS

## 2015-03-23 MED ORDER — ONDANSETRON HCL 4 MG/2ML IJ SOLN
INTRAMUSCULAR | Status: AC
  Filled 2015-03-23: qty 2

## 2015-03-23 MED ORDER — PROPOFOL 10 MG/ML IV BOLUS
INTRAVENOUS | Status: DC | PRN
Start: 2015-03-23 — End: 2015-03-23
  Administered 2015-03-23: 120 mg via INTRAVENOUS

## 2015-03-23 MED ORDER — SUCCINYLCHOLINE CHLORIDE 20 MG/ML IJ SOLN
INTRAMUSCULAR | Status: DC | PRN
  Administered 2015-03-23: 100 mg via INTRAVENOUS

## 2015-03-23 NOTE — H&P (View-Only) (Signed)
Sara Russell    [id number]    1931/02/05  Primary Care Physician:HENSEL,WILLIAM Arnell Sieving, MD  Referring Physician: Zenia Resides, MD 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  She was followed by Dr. Olevia Perches previously for lymphocytic colitis diagnosed on flexible sigmoidoscopy in April 2012,  Chronic GERD and history of esophageal stricture status post dilation in 1992 and also has history of chronic iron deficiency anemia. She had a  MRI abdomen in August 2016 which showed a 2.5 cm well-circumscribed T2 hyperintense mass located between theintrahepatic IVC and right adrenal gland, exact organ of origin unclear. Primary differential considerations include pheochromocytoma, sarcoma, or possibly metastasis as per the radiologist. Follow-up MRI abdomen  2 weeks ago  Showed complete resolution of the previously noted mass like area and has findings suggestive of choledocholithiasis and pancreatic side branch IPMN.  She denies any abdominal pain or jaundice.  She had a compression fracture of  L1 last month  and has back pain. Denies any nausea, vomiting,  melena or bright red blood per rectum.  LFT are within normal limits.     Outpatient Encounter Prescriptions as of 03/07/2015  Medication Sig  . amiodarone (PACERONE) 200 MG tablet Take 1 tablet (200 mg total) by mouth daily.  Marland Kitchen apixaban (ELIQUIS) 2.5 MG TABS tablet Take 1 tablet (2.5 mg total) by mouth 2 (two) times daily.  Marland Kitchen atorvastatin (LIPITOR) 10 MG tablet TAKE ONE TABLET BY MOUTH ONCE DAILY AT  6PM  . calcium-vitamin D (SM CALCIUM-VITAMIN D) 500-200 MG-UNIT per tablet Take 1 tablet by mouth 2 (two) times daily.   . carvedilol (COREG) 6.25 MG tablet Take 1 tablet (6.25 mg total) by mouth 2 (two) times daily with a meal.  . furosemide (LASIX) 20  MG tablet Take 1 tablet (20 mg total) by mouth daily.  Marland Kitchen HYDROcodone-acetaminophen (NORCO/VICODIN) 5-325 MG tablet Take 1 tablet by mouth every 6 (six) hours as needed for moderate pain.  Marland Kitchen levothyroxine (LEVOTHROID) 25 MCG tablet Take 1 tablet (25 mcg total) by mouth daily before breakfast.  . omeprazole (PRILOSEC) 40 MG capsule Take 1 capsule (40 mg total) by mouth daily.  . potassium chloride (K-DUR,KLOR-CON) 10 MEQ tablet Take 10 mEq by mouth daily.  Marland Kitchen saccharomyces boulardii (FLORASTOR) 250 MG capsule Take 1 capsule (250 mg total) by mouth 2 (two) times daily.  . [DISCONTINUED] acetaminophen (TYLENOL) 650 MG CR tablet Take 650 mg by mouth every 8 (eight) hours as needed for pain.  . [DISCONTINUED] amoxicillin (AMOXIL) 500 MG capsule Take 1 capsule (500 mg total) by mouth 3 (three) times daily.  . [DISCONTINUED] ferrous sulfate 325 (65 FE) MG tablet Take 1 tablet (325 mg total) by mouth daily with breakfast.  . [DISCONTINUED] metroNIDAZOLE (FLAGYL) 500 MG tablet Take 1 tablet (500 mg total) by mouth 3 (three) times daily.  . [DISCONTINUED] polycarbophil (FIBERCON) 625 MG tablet Take 1 tablet (625 mg total) by mouth 2 (two) times daily.   No facility-administered encounter medications on file as of 03/07/2015.    Allergies as of 03/07/2015 - Review Complete 03/07/2015  Allergen Reaction Noted  . Iohexol Other (See Comments) 08/07/2009  . Ivp dye [iodinated diagnostic agents] Hives 12/16/2010  . Scallops [shellfish allergy] Other (See Comments) 12/16/2010  . Sulfa antibiotics Hives 12/16/2010  . Sulfamethoxazole Hives 02/19/2006  Past Medical History  Diagnosis Date  . S/P dilatation of esophageal stricture 2002  . Hypertension   . Hyperlipidemia   . DVT (deep venous thrombosis) ([place]) 2009  . PE (pulmonary embolism) 2009  . GERD (gastroesophageal reflux disease)   . Diverticulosis   . Iron deficiency anemia   . Hyperplastic colon polyp   . Positive H. pylori test 01/20/96  .  Degenerative joint disease   . Skin cancer     h/o basal and squamous skin cancer--> removed  . Persistent atrial fibrillation ([place])   . Shortness of breath dyspnea   . AKI (acute kidney injury) ([place]) 09/15/2014  . Colitis, ulcerative ([place])   . Abdominal mass   . Atrial fibrillation [facility])     Past Surgical History  Procedure Laterality Date  . Dilation and curettage of uterus      for endometrial polyps  . Replacement total knee      left  . Tonsillectomy    . Appendectomy    . Breast lumpectomy      benign  . Joint replacement      Family History  Problem Relation Age of Onset  . Esophageal cancer Brother   . Diverticulitis Brother   . Colon cancer Neg Hx   . Heart disease Mother   . Stroke Mother     Social History   Social History  . Marital Status: Widowed    Spouse Name: N/A  . Number of Children: 3  . Years of Education: college   Occupational History  . retired Marine scientist    Social History Main Topics  . Smoking status: Former Smoker -- 0.50 packs/day for 13 years    Types: Cigarettes    Start date: 02/12/1948    Quit date: 02/11/1961  . Smokeless tobacco: Never Used     Comment: Denies use of tobacco since 1963  . Alcohol Use: 0.0 oz/week    7-14 Glasses of wine per week     Comment: 1-2 glasses of wine per day  . Drug Use: No  . Sexual Activity: Not Currently   Other Topics Concern  . Not on file   Social History Narrative   Health Care POA:    Emergency Contact: son, Lynnzie Blackson (c) [phone] [name])   End of Life Plan:    Who lives with you: self   Any pets: none   Diet: Pt has a varied diet of protein, starch, vegetables.   Exercise: Pt has no regular exercise plan because of pain in back.   Seatbelts: Pt reports wearing seatbelt when in vehicle.   Nancy Fetter Exposure/Protection: Pt reports wearing sun screen.   Hobbies: reading, traveling, eating out.      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  HENT: Negative.   Eyes: Negative for blurred vision.  Respiratory: Negative for cough, shortness of breath and wheezing.   Cardiovascular: Negative for chest pain and palpitations.  Gastrointestinal: as per HPI Genitourinary: Negative for dysuria, urgency, frequency and hematuria.  Musculoskeletal: Negative for myalgias, back pain and joint pain.  Skin: Negative for itching and rash.  Neurological: Negative for dizziness, tremors, focal weakness, seizures and loss of consciousness.  Endo/Heme/Allergies: Negative for environmental allergies.  Psychiatric/Behavioral: Negative for depression, suicidal  ideas and hallucinations.  All other systems reviewed and are negative.   Physical Exam: Filed Vitals:   03/07/15 1445  BP: 124/62  Pulse: 68   Gen:      No acute distress,  Scoliosis and kyphosis HEENT:  EOMI, sclera anicteric Neck:     No masses; no thyromegaly Lungs:    Clear to auscultation bilaterally; normal respiratory effort CV:         Regular rate and rhythm; no murmurs Abd:      + bowel sounds; soft, non-tender; no palpable masses, no distension Ext:    No edema; adequate peripheral perfusion Skin:      Warm and dry; no rash Neuro: alert and oriented x 3 Psych: normal mood and affect  Data Reviewed:  MRI abdomen 02/23/2015 Complete resolution of the previously noted mass-like area between the right adrenal gland and intrahepatic IVC. This remains of uncertain etiology and significance, but was presumably a benign finding (potentially posttraumatic hemorrhage or a spontaneous adrenal hemorrhage given the prior history of fall) on prior examinations. No findings on today's study to suggest neoplasm at this time. 2. Choledocholithiasis with extra hepatic biliary ductal dilatation and minimal intrahepatic  biliary ductal dilatation, as above. Correlation with ERCP for stone extraction should be considered if clinically appropriate. 3. In addition, there is a large amount of biliary sludge and/or gallstones in the gallbladder. No current findings to suggest an acute cholecystitis at this time. 4. Large hiatal hernia. 5. 3.1 cm simple cyst in the left kidney incidentally noted. 6. Additional incidental findings, as above.    Assessment and Plan/Recommendations:  82 year old female with incidental finding of choledocholithiasis on MRI abdomen.  She is currently asymptomatic and has normal LFT.  No evidence of biliary obstruction or cholangitis.  we'll schedule for  Elective ERCP with stone extraction,  have to coordinate schedule  and availability of my colleagues that do ERCP She is on chronic anticoagulation for A. Fib  We'll discuss with cardiology if Eliquis could be held for the procedure  we'll recheck BMP and LFT  K. Denzil Magnuson , MD [phone] Mon-Fri 8a-5p [phone] after 5p, weekends, holidays

## 2015-03-23 NOTE — Interval H&P Note (Signed)
History and Physical Interval Note:  03/23/2015 11:56 AM  Sara Russell  has presented today for surgery, with the diagnosis of CBD stones  The various methods of treatment have been discussed with the patient and family. After consideration of risks, benefits and other options for treatment, the patient has consented to  Procedure(s): ENDOSCOPIC RETROGRADE CHOLANGIOPANCREATOGRAPHY (ERCP) WITH PROPOFOL (N/A) as a surgical intervention .  The patient's history has been reviewed, patient examined, no change in status, stable for surgery.  I have reviewed the patient's chart and labs.  Questions were answered to the patient's satisfaction.     Milus Banister

## 2015-03-23 NOTE — Discharge Instructions (Signed)

## 2015-03-23 NOTE — Transfer of Care (Signed)
Immediate Anesthesia Transfer of Care Note  Patient: Sara Russell  Procedure(s) Performed: Procedure(s): ENDOSCOPIC RETROGRADE CHOLANGIOPANCREATOGRAPHY (ERCP) WITH PROPOFOL (N/A)  Patient Location: PACU and Endoscopy Unit  Anesthesia Type:General  Level of Consciousness: awake, alert , oriented and patient cooperative  Airway & Oxygen Therapy: Patient Spontanous Breathing and Patient connected to face mask oxygen  Post-op Assessment: Report given to RN, Post -op Vital signs reviewed and stable and Patient moving all extremities  Post vital signs: Reviewed and stable  Last Vitals:  Filed Vitals:   03/23/15 1129  BP: 166/54  Pulse: 66  Temp: 36.4 C  Resp: 14    Complications: No apparent anesthesia complications

## 2015-03-23 NOTE — Telephone Encounter (Signed)
-----   Message from Milus Banister, MD sent at 03/23/2015  2:51 PM EST ----- Sara Russell Unsuccessful ERCP, see below.  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  Sara Russell, She needs referral to Dr. Gayleen Orem at [facility] for incidental CBD stone, failed ERCP today.  Thanks

## 2015-03-23 NOTE — Anesthesia Procedure Notes (Signed)
Procedure Name: Intubation Date/Time: 03/23/2015 1:33 PM Performed by: Carleene Cooper A Pre-anesthesia Checklist: Patient identified, Timeout performed, Emergency Drugs available, Suction available and Patient being monitored Patient Re-evaluated:Patient Re-evaluated prior to inductionOxygen Delivery Method: Circle system utilized Preoxygenation: Pre-oxygenation with 100% oxygen Intubation Type: IV induction Ventilation: Mask ventilation without difficulty Laryngoscope Size: Mac and 3 Grade View: Grade II Tube type: Oral Tube size: 7.5 mm Number of attempts: 2 Airway Equipment and Method: Bougie stylet Placement Confirmation: breath sounds checked- equal and bilateral,  ETT inserted through vocal cords under direct vision and positive ETCO2 Secured at: 21 cm Tube secured with: Tape Dental Injury: Teeth and Oropharynx as per pre-operative assessment  Comments: Easy mask. DL with MAC 3 by CRNA. Unable to visualize vocal cords. DL X1 by Dr. Landry Dyke after head repositioned. Sats 100%. Grade 2-3 view. Bougie placed. + ETCO2 BBS=. ATOI

## 2015-03-23 NOTE — Anesthesia Postprocedure Evaluation (Signed)
Anesthesia Post Note  Patient: Sara Russell  Procedure(s) Performed: Procedure(s) (LRB): ENDOSCOPIC RETROGRADE CHOLANGIOPANCREATOGRAPHY (ERCP) WITH PROPOFOL (N/A)  Patient location during evaluation: PACU Anesthesia Type: General Level of consciousness: awake and alert Pain management: pain level controlled Vital Signs Assessment: post-procedure vital signs reviewed and stable Respiratory status: spontaneous breathing, nonlabored ventilation, respiratory function stable and patient connected to nasal cannula oxygen Cardiovascular status: blood pressure returned to baseline and stable Postop Assessment: no signs of nausea or vomiting Anesthetic complications: no    Last Vitals:  Filed Vitals:   03/23/15 1530 03/23/15 1540  BP: 184/71 170/66  Pulse: 62 59  Temp:    Resp: 13 17    Last Pain:  Filed Vitals:   03/23/15 1542  PainSc: 2                  Temisha Murley L

## 2015-03-23 NOTE — Anesthesia Preprocedure Evaluation (Addendum)
Anesthesia Evaluation  Patient identified by MRN, date of birth, ID band Patient awake    Reviewed: Allergy & Precautions, H&P , NPO status , Patient's Chart, lab work & pertinent test results, reviewed documented beta blocker date and time   Airway Mallampati: II  TM Distance: >3 FB Neck ROM: full    Dental  (+) Dental Advisory Given, Caps 4 upper front capped:   Pulmonary neg pulmonary ROS, former smoker, PE   Pulmonary exam normal breath sounds clear to auscultation       Cardiovascular hypertension, Pt. on home beta blockers and Pt. on medications +CHF  Normal cardiovascular exam+ dysrhythmias Atrial Fibrillation  Rhythm:regular Rate:Normal  Chronic diastolic heart failure. NSR now   Neuro/Psych negative neurological ROS  negative psych ROS   GI/Hepatic negative GI ROS, Neg liver ROS, PUD, GERD  Medicated and Controlled,  Endo/Other  negative endocrine ROSHypothyroidism   Renal/GU negative Renal ROS  negative genitourinary   Musculoskeletal   Abdominal   Peds  Hematology negative hematology ROS (+)   Anesthesia Other Findings   Reproductive/Obstetrics negative OB ROS                           Anesthesia Physical Anesthesia Plan  ASA: III  Anesthesia Plan: General   Post-op Pain Management:    Induction: Intravenous  Airway Management Planned: Oral ETT  Additional Equipment:   Intra-op Plan:   Post-operative Plan: Extubation in OR  Informed Consent: I have reviewed the patients History and Physical, chart, labs and discussed the procedure including the risks, benefits and alternatives for the proposed anesthesia with the patient or authorized representative who has indicated his/her understanding and acceptance.   Dental Advisory Given  Plan Discussed with: CRNA and Surgeon  Anesthesia Plan Comments:        Anesthesia Quick Evaluation

## 2015-03-23 NOTE — Op Note (Addendum)
[facility] [place], [address]   ERCP PROCEDURE REPORT  PATIENT: Sara Russell, Sara Russell  MR#: [id number] BIRTHDATE: Jul 16, 1930  GENDER: female ENDOSCOPIST: Milus Banister, MD PROCEDURE DATE:  03/23/2015 PROCEDURE:   ERCP, diagnostic INDICATIONS:incidental CBD stone, gallstones; normal liver tests. MEDICATIONS: Per Anesthesia, cipro 421m IV, indomethacin 1046mPR TOPICAL ANESTHETIC:   none  DESCRIPTION OF PROCEDURE:     Physical exam was performed.  Informed consent was obtained from the patient after explaining the benefits, risks, and alternatives to the procedure.  The patient was connected to the monitor and placed in the semi-prone position. IV medicine was administered through an indwelling cannula and oxygen via endotracheal tube.  After administration of sedation, the patients esophagus was intubated and the Pentax ERCP [id number] was advanced under direct visualization to the second portion of the duodenum.  The anatomy of second duodenum was unusual, tortuous but I did not see a clear diverticulum.  I could not locate a typical appearing major papilla complex. Rather, under a large duodenal fold there was a pinpoint opening. 2-3 times throughout the case there was yellow bile at or near the pinpoint opening and so I suspected this was the biliary orifice.  Using a 44 Autotome over a 0.035 hydrawire I probed this site several times.  The wire entered the orifice to a lenght of several CM heading medially via flouroscopy.  I presumed this was the pancreatic duct.  Dye was not inejcted however.  I was never able to advance wire in the usual direction of the CBD and after some time, the procedure was terminated.    The scope was then completely withdrawn from the patient and the procedure terminated.   COMPLICATIONS:    None  ENDOSCOPIC IMPRESSION: Incidental choledocholithiasis (on recent MR).  Failed ERCP, unable to cannulate the  bile duct.  RECOMMENDATIONS: My office will [facility]. Please resume your eliquis today.   _______________________________ eSigned: Milus BanisterMD 03/23/2015 2:51 PM Revised: 03/23/2015 2:51 PM

## 2015-03-24 ENCOUNTER — Encounter (HOSPITAL_COMMUNITY): Payer: Self-pay | Admitting: Gastroenterology

## 2015-03-24 NOTE — Telephone Encounter (Signed)
veena and Larron Armor,   She actually wants to hold on the referral to [facility] Dr. Lysle Rubens until after I speak with her long time PCP Dr. Andria Frames. I will do that tomorrow or early next week and get back to you about the plan

## 2015-03-27 ENCOUNTER — Telehealth: Payer: Self-pay | Admitting: Gastroenterology

## 2015-03-27 DIAGNOSIS — N183 Chronic kidney disease, stage 3 (moderate): Secondary | ICD-10-CM | POA: Diagnosis not present

## 2015-03-27 DIAGNOSIS — I10 Essential (primary) hypertension: Secondary | ICD-10-CM | POA: Diagnosis not present

## 2015-03-27 DIAGNOSIS — E876 Hypokalemia: Secondary | ICD-10-CM | POA: Diagnosis not present

## 2015-03-27 NOTE — Telephone Encounter (Signed)
Dr. Shon Hough, I did ERCP for Sara Russell last week, it was not successful (see full report in chart).  I offered her referral to Dr. Gayleen Orem [facility] biliary expert) for another try.  She asked that I get your opinion on it.  It's definitely standard of care to try ERCP again but given her age and the fact that the stones are completely incidental I cannot argue with just observation instead of repeat ERCP attempt.  She would be at risk for biliary pain, cholangitits, pancreatitis.  Let me know what you think.  I"m happy to discuss on phone as well.  [phone] is my cell.  Thanks  DJ

## 2015-03-28 ENCOUNTER — Other Ambulatory Visit: Payer: Self-pay | Admitting: Cardiology

## 2015-03-28 NOTE — Telephone Encounter (Signed)
Called and discussed.  The million dollar question is are the stones symptomatic or not.  She has LEFT sided abd pain and intermitant diarrhea - certainly atypical symptoms.  She does have back pain that is likely due to a new compression fracture and unrelated to the stones.  The biggest question I have is the August admit for sepsis where she was found to have a perihepatic mass - could this be related to the stones.    Because these stones are not obviously symptomatic, we decided to use time as our diagnostic test.  If she has epigastric or Rt UQ pain or other biliary problems, will refer for repeat attempt ERCP.  If her symptoms remain dubious, will continue to hold off on the ERCP/

## 2015-03-29 NOTE — Telephone Encounter (Signed)
THanks for the input.  I think this is a completely reasonable approach.  Please contact me if you have any questions or concerns.

## 2015-03-30 NOTE — Telephone Encounter (Signed)
Call [place], RN at 12/12/2014 12:20 PM     Status: Signed       Expand All Collapse All   Spoke with patient on Friday, 10/28, evening. Informed her I would send PCP results/info.  Faxed lab results and instructions to PCP.            Stanton Kidney, RN at 12/01/2014 4:15 PM     Status: Signed       Expand All Collapse All    Notes Recorded by Stanton Kidney, RN on 12/01/2014 at 4:14 PM Patient returns call.  Agreeable to come by office tomorrow for T3 & T4. Requested rx be sent to [facility]. Will route this information to her PCP and I will follow up with her next week about tomorrows lab. Patient verbalized understanding and agreeable to plan.   Notes Recorded by Stanton Kidney, RN on 12/01/2014 at 11:36 AM Reviewed results with DOD, Dr. Radford Pax. Orders received: Obtain T3 & T4, route results to PCP. Start Synthroid 25 mcg daily, repeat TSH in 6 weeks w/ PCP. Route this information to PCP.  lmtcb on patient's home phone       Ref Range 1d ago (11/30/14) 43yrago (08/02/13) 160yrgo (12/10/12) 5y71yro (07/05/09) 60yr33yr (04/11/08) 20yr 63yr(04/08/07)    TSH 0.350 - 4.500 uIU/mL 25.653 (H) 1.240 1.943 3.475R, CM 3.335R, CM 5.026R, CM   Resulting Agency  [facility]                    KedraCharlett Nose0/20/2016 3:52 PM     Status: Signed       Expand All Collapse All   New Message   Pt calling for triage RN to give her   Her lab results        Will send to [facility]

## 2015-03-31 NOTE — Telephone Encounter (Signed)
Should go to her PCP

## 2015-03-31 NOTE — Telephone Encounter (Signed)
[name], RN at 03/31/2015 8:46 AM     Status: Signed       Expand All Collapse All   Should go to her PCP

## 2015-04-04 ENCOUNTER — Telehealth: Payer: Self-pay | Admitting: Family Medicine

## 2015-04-04 DIAGNOSIS — E039 Hypothyroidism, unspecified: Secondary | ICD-10-CM | POA: Insufficient documentation

## 2015-04-04 MED ORDER — LEVOTHYROXINE SODIUM 25 MCG PO TABS: 25.0000 ug | ORAL_TABLET | Freq: Every day | ORAL | Status: DC

## 2015-04-04 NOTE — Assessment & Plan Note (Signed)
Needs repeat TSH

## 2015-04-04 NOTE — Telephone Encounter (Signed)
Also needs TSH.  Patient informed.

## 2015-04-04 NOTE — Telephone Encounter (Signed)
Pt called and needs a refill on her Levothyroxine. jw

## 2015-04-06 ENCOUNTER — Other Ambulatory Visit: Payer: Self-pay

## 2015-04-06 DIAGNOSIS — Z1231 Encounter for screening mammogram for malignant neoplasm of breast: Secondary | ICD-10-CM

## 2015-04-11 ENCOUNTER — Telehealth: Payer: Self-pay | Admitting: Family Medicine

## 2015-04-11 NOTE — Telephone Encounter (Signed)
Called and discussed.  No swelling  For now, will hold lasix and follow symptoms.  She will stay in touch.

## 2015-04-11 NOTE — Telephone Encounter (Signed)
Has been experiencing some dizzyness and lightheadednes.  bp has been running 135-145 and 67-70. HR has been around 60, would like to talk to Dr Andria Frames about this. She will stay at home until she hears form him

## 2015-04-18 ENCOUNTER — Telehealth: Payer: Self-pay | Admitting: Family Medicine

## 2015-04-18 NOTE — Telephone Encounter (Signed)
Condition has gotten better.  Want to know what she should due about taking her diuretic and potassium.

## 2015-04-18 NOTE — Telephone Encounter (Signed)
BP OK and no leg swelling off lasix and potassium.  Much less lightheadedness.  Told to stay off those medications indefinitely.  Call me if swelling recurs or BP increases.

## 2015-04-27 ENCOUNTER — Encounter: Payer: Self-pay | Admitting: Internal Medicine

## 2015-04-28 ENCOUNTER — Telehealth: Payer: Self-pay | Admitting: Family Medicine

## 2015-04-28 DIAGNOSIS — E039 Hypothyroidism, unspecified: Secondary | ICD-10-CM

## 2015-04-28 NOTE — Telephone Encounter (Signed)
Pt would like to have her prescriptions changed to 90 day supply.  These will be sent to [facility] on [address]. The meds she needs changed [id number], eliquis, amioldarone, carvedilol, atorvastin, amlodipine and ompeprazole. Please let pt know if this can be done

## 2015-05-01 MED ORDER — APIXABAN 2.5 MG PO TABS: 2.5000 mg | ORAL_TABLET | Freq: Two times a day (BID) | ORAL | Status: DC

## 2015-05-01 MED ORDER — LEVOTHYROXINE SODIUM 25 MCG PO TABS
25.0000 ug | ORAL_TABLET | Freq: Every day | ORAL | Status: DC
Start: 2015-05-01 — End: 2015-07-27

## 2015-05-01 MED ORDER — OMEPRAZOLE 40 MG PO CPDR: 40.0000 mg | DELAYED_RELEASE_CAPSULE | Freq: Every day | ORAL | Status: DC

## 2015-05-01 MED ORDER — AMLODIPINE BESYLATE 2.5 MG PO TABS: 2.5000 mg | ORAL_TABLET | Freq: Every day | ORAL | Status: DC

## 2015-05-01 MED ORDER — CARVEDILOL 6.25 MG PO TABS
6.2500 mg | ORAL_TABLET | Freq: Two times a day (BID) | ORAL | Status: DC
Start: 2015-05-01 — End: 2016-05-02

## 2015-05-01 MED ORDER — ATORVASTATIN CALCIUM 10 MG PO TABS: ORAL_TABLET | ORAL | Status: DC

## 2015-05-01 MED ORDER — AMIODARONE HCL 200 MG PO TABS: 200.0000 mg | ORAL_TABLET | Freq: Every day | ORAL | Status: DC

## 2015-05-01 NOTE — Telephone Encounter (Signed)
done

## 2015-05-15 ENCOUNTER — Ambulatory Visit (INDEPENDENT_AMBULATORY_CARE_PROVIDER_SITE_OTHER): Payer: Medicare Other | Admitting: Internal Medicine

## 2015-05-15 DIAGNOSIS — I4819 Other persistent atrial fibrillation: Secondary | ICD-10-CM

## 2015-05-15 DIAGNOSIS — I481 Persistent atrial fibrillation: Secondary | ICD-10-CM

## 2015-05-15 LAB — PULMONARY FUNCTION TEST
DL/VA % pred: 92 %
DL/VA: 3.98 ml/min/mmHg/L
DLCO cor % pred: 62 %
DLCO cor: 12.02 ml/min/mmHg
DLCO unc % pred: 56 %
DLCO unc: 10.89 ml/min/mmHg
FEF 25-75 Post: 2.38 L/sec
FEF 25-75 Pre: 1.43 L/sec
FEF2575-%Change-Post: 66 %
FEF2575-%Pred-Post: 224 %
FEF2575-%Pred-Pre: 135 %
FEV1-%Change-Post: 8 %
FEV1-%Pred-Post: 114 %
FEV1-%Pred-Pre: 105 %
FEV1-Post: 1.72 L
FEV1-Pre: 1.58 L
FEV1FVC-%Change-Post: 6 %
FEV1FVC-%Pred-Pre: 111 %
FEV6-%Change-Post: 6 %
FEV6-%Pred-Post: 103 %
FEV6-%Pred-Pre: 96 %
FEV6-Post: 1.97 L
FEV6-Pre: 1.85 L
FEV6FVC-%Change-Post: 4 %
FEV6FVC-%Pred-Post: 107 %
FEV6FVC-%Pred-Pre: 102 %
FVC-%Change-Post: 1 %
FVC-%Pred-Post: 96 %
FVC-%Pred-Pre: 94 %
FVC-Post: 1.97 L
FVC-Pre: 1.94 L
Post FEV1/FVC ratio: 87 %
Post FEV6/FVC ratio: 100 %
Pre FEV1/FVC ratio: 82 %
Pre FEV6/FVC Ratio: 95 %

## 2015-05-15 NOTE — Progress Notes (Signed)
PFT done today. 

## 2015-05-18 ENCOUNTER — Ambulatory Visit
Admission: RE | Admit: 2015-05-18 | Discharge: 2015-05-18 | Disposition: A | Discharge status: Home / Self Care | Payer: Medicare Other | Source: Ambulatory Visit

## 2015-05-18 DIAGNOSIS — Z1231 Encounter for screening mammogram for malignant neoplasm of breast: Secondary | ICD-10-CM | POA: Diagnosis not present

## 2015-05-25 ENCOUNTER — Encounter (HOSPITAL_COMMUNITY): Payer: Self-pay | Admitting: Nurse Practitioner

## 2015-05-25 ENCOUNTER — Ambulatory Visit (HOSPITAL_COMMUNITY)
Admission: RE | Admit: 2015-05-25 | Discharge: 2015-05-25 | Disposition: A | Discharge status: Home / Self Care | Payer: Medicare Other | Source: Ambulatory Visit | Attending: Nurse Practitioner | Admitting: Nurse Practitioner

## 2015-05-25 VITALS — BP 128/74 | HR 71 | Ht 62.0 in | Wt 132.0 lb

## 2015-05-25 DIAGNOSIS — I4891 Unspecified atrial fibrillation: Secondary | ICD-10-CM | POA: Diagnosis not present

## 2015-05-25 DIAGNOSIS — K219 Gastro-esophageal reflux disease without esophagitis: Secondary | ICD-10-CM | POA: Insufficient documentation

## 2015-05-25 DIAGNOSIS — I481 Persistent atrial fibrillation: Secondary | ICD-10-CM | POA: Diagnosis not present

## 2015-05-25 DIAGNOSIS — I4819 Other persistent atrial fibrillation: Secondary | ICD-10-CM

## 2015-05-25 DIAGNOSIS — Z0189 Encounter for other specified special examinations: Secondary | ICD-10-CM | POA: Insufficient documentation

## 2015-05-25 DIAGNOSIS — Z87891 Personal history of nicotine dependence: Secondary | ICD-10-CM | POA: Diagnosis not present

## 2015-05-25 DIAGNOSIS — E039 Hypothyroidism, unspecified: Secondary | ICD-10-CM | POA: Diagnosis not present

## 2015-05-25 DIAGNOSIS — E785 Hyperlipidemia, unspecified: Secondary | ICD-10-CM | POA: Diagnosis not present

## 2015-05-25 DIAGNOSIS — I129 Hypertensive chronic kidney disease with stage 1 through stage 4 chronic kidney disease, or unspecified chronic kidney disease: Secondary | ICD-10-CM | POA: Insufficient documentation

## 2015-05-25 DIAGNOSIS — D509 Iron deficiency anemia, unspecified: Secondary | ICD-10-CM | POA: Diagnosis not present

## 2015-05-25 DIAGNOSIS — Z86718 Personal history of other venous thrombosis and embolism: Secondary | ICD-10-CM | POA: Insufficient documentation

## 2015-05-25 DIAGNOSIS — N189 Chronic kidney disease, unspecified: Secondary | ICD-10-CM | POA: Insufficient documentation

## 2015-05-25 DIAGNOSIS — N179 Acute kidney failure, unspecified: Secondary | ICD-10-CM | POA: Insufficient documentation

## 2015-05-25 DIAGNOSIS — Z7901 Long term (current) use of anticoagulants: Secondary | ICD-10-CM | POA: Insufficient documentation

## 2015-05-25 DIAGNOSIS — Z86711 Personal history of pulmonary embolism: Secondary | ICD-10-CM | POA: Insufficient documentation

## 2015-05-25 LAB — TSH: TSH: 7.409 u[IU]/mL — ABNORMAL HIGH (ref 0.350–4.500)

## 2015-05-25 NOTE — Patient Instructions (Signed)
Your physician wants you to follow-up in: 6 months with Roderic Palau, NP. You will receive a reminder letter in the mail two months in advance. If you don't receive a letter, please call our office to schedule the follow-up appointment.

## 2015-05-25 NOTE — Progress Notes (Signed)
Patient ID: Sara Russell, female   DOB: 07-18-1930, 82 y.o.   MRN: [id number]     Primary Care Physician: Zigmund Gottron, MD Referring Physician:   CALLIA SWIM is a 82 y.o. female with a h/o afib maintaining SR on amiodarone. She been chronic af for years prior to amiodarone use.. She feels well today and has no complaints. She is needing a TSH drawn today, started on replacement in the recent past.  Today, she denies symptoms of palpitations, chest pain, shortness of breath, orthopnea, PND, lower extremity edema, dizziness, presyncope, syncope, or neurologic sequela. The patient is tolerating medications without difficulties and is otherwise without complaint today.   Past Medical History  Diagnosis Date  . S/P dilatation of esophageal stricture 1992  . Hypertension   . Hyperlipidemia   . DVT (deep venous thrombosis) ([place]) 2009    BOTH LUNGS  . PE (pulmonary embolism) 2009  . GERD (gastroesophageal reflux disease)   . Diverticulosis   . Hyperplastic colon polyp   . Positive H. pylori test 01/20/96  . Degenerative joint disease   . Persistent atrial fibrillation ([place])   . AKI (acute kidney injury) ([place]) 09/15/2014  . Colitis, ulcerative ([place])   . Abdominal mass   . Atrial fibrillation ([place])   . Hypothyroidism   . Cough     WHITE SPUTUM FOR 2 WEEKS, NO FEVER  . Skin cancer     h/o basal and squamous skin cancer--> removed  . Iron deficiency anemia     ON IRON AT TIMES   Past Surgical History  Procedure Laterality Date  . Dilation and curettage of uterus      for endometrial polyps  . Replacement total knee      left  . Appendectomy    . Joint replacement    . Tonsillectomy    . Breast lumpectomy      benign  . Orif ankle fracture Left     X 2  . Surgery done to remove left ankle hardware later    . Endoscopic retrograde cholangiopancreatography (ercp) with propofol N/A 03/23/2015    Procedure: ENDOSCOPIC RETROGRADE CHOLANGIOPANCREATOGRAPHY (ERCP) WITH  PROPOFOL;  Surgeon: Milus Banister, MD;  Location: [facility] ENDOSCOPY;  Service: Endoscopy;  Laterality: N/A;    Current Outpatient Prescriptions  Medication Sig Dispense Refill  . acetaminophen (TYLENOL ARTHRITIS PAIN) 650 MG CR tablet Take 650 mg by mouth daily.    Marland Kitchen amiodarone (PACERONE) 200 MG tablet Take 1 tablet (200 mg total) by mouth daily. 90 tablet 3  . amLODipine (NORVASC) 5 MG tablet Take 5 mg by mouth daily.    Marland Kitchen apixaban (ELIQUIS) 2.5 MG TABS tablet Take 1 tablet (2.5 mg total) by mouth 2 (two) times daily. 180 tablet 3  . atorvastatin (LIPITOR) 10 MG tablet TAKE ONE TABLET BY MOUTH ONCE DAILY AT  6PM 90 tablet 3  . carvedilol (COREG) 6.25 MG tablet Take 1 tablet (6.25 mg total) by mouth 2 (two) times daily with a meal. 180 tablet 3  . diphenhydramine-acetaminophen (TYLENOL PM) 25-500 MG TABS tablet Take 1 tablet by mouth at bedtime as needed.    Marland Kitchen levothyroxine (LEVOTHROID) 25 MCG tablet Take 1 tablet (25 mcg total) by mouth daily before breakfast. 90 tablet 3  . omeprazole (PRILOSEC) 40 MG capsule Take 1 capsule (40 mg total) by mouth daily. 90 capsule 3  . saccharomyces boulardii (FLORASTOR) 250 MG capsule Take 1 capsule (250 mg total) by mouth 2 (two) times daily.    . [  DISCONTINUED] omeprazole (PRILOSEC OTC) 20 MG tablet Take 20 mg by mouth 2 (two) times daily.       No current facility-administered medications for this encounter.    Allergies  Allergen Reactions  . Iohexol Other (See Comments)    unknown  . Ivp Dye [Iodinated Diagnostic Agents] Hives    Was able to tolerate Contrast Media for CT scan.  [name] [Shellfish Allergy] Other (See Comments)    Projectile vomiting.  . Sulfa Antibiotics Hives  . Sulfamethoxazole Hives    REACTION: unspecified    Social History   Social History  . Marital Status: Widowed    Spouse Name: N/A  . Number of Children: 3  . Years of Education: college   Occupational History  . retired Marine scientist    Social History Main Topics    . Smoking status: Former Smoker -- 0.50 packs/day for 13 years    Types: Cigarettes    Start date: 02/12/1948    Quit date: 02/11/1961  . Smokeless tobacco: Never Used     Comment: Denies use of tobacco since 1963  . Alcohol Use: 0.0 oz/week    7-14 Glasses of wine per week     Comment: WINE FEW TIMES PER WEEK  . Drug Use: No  . Sexual Activity: Not Currently   Other Topics Concern  . Not on file   Social History Narrative   Health Care POA:    Emergency Contact: son, Vastie Douty (c) [phone] [name])   End of Life Plan:    Who lives with you: self   Any pets: none   Diet: Pt has a varied diet of protein, starch, vegetables.   Exercise: Pt has no regular exercise plan because of pain in back.   Seatbelts: Pt reports wearing seatbelt when in vehicle.   Nancy Fetter Exposure/Protection: Pt reports wearing sun screen.   Hobbies: reading, traveling, eating out.      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 Esophageal cancer Brother   . Diverticulitis Brother   . Colon cancer Neg Hx   . Heart disease Mother   . Stroke Mother     ROS- All systems are reviewed and negative except as per the HPI above  Physical Exam: Filed Vitals:   05/25/15 0947  BP: 128/74  Pulse: 71  Height: 5' 2"  (1.575 m)  Weight: 132 lb (59.875 kg)    GEN- The patient is well appearing, alert and oriented x 3 today.   Head- normocephalic, atraumatic Eyes-  Sclera clear, conjunctiva pink Ears- hearing intact Oropharynx- clear Neck- supple, no JVP Lymph- no cervical lymphadenopathy Lungs- Clear to ausculation bilaterally, normal work of breathing Heart- Regular rate and rhythm, no murmurs, rubs or gallops, PMI not laterally displaced GI- soft, NT, ND, + BS Extremities- no clubbing, cyanosis, or edema MS- no significant deformity or  atrophy Skin- no rash or lesion Psych- euthymic mood, full affect Neuro- strength and sensation are intact  EKG- NSR at 71 bpm, LAD, print 182 ms, wrs int 84 ms, qtc 510 ms Epic records reviewed   Assessment and Plan: 1.afib In SR on amiodarone  Continue 200 mg daily Continue apixaban at 2.5 mg bid PFT's recently done by PCP LIver panel normal in January  TSH today  2. CKD Per  nephrololgist  3. HTN  Stable  F/u in 6 months  Geroge Baseman. Stpehanie Montroy, [facility] [address] [place], [place] [address] [phone]

## 2015-05-30 DIAGNOSIS — D2271 Melanocytic nevi of right lower limb, including hip: Secondary | ICD-10-CM | POA: Diagnosis not present

## 2015-05-30 DIAGNOSIS — L814 Other melanin hyperpigmentation: Secondary | ICD-10-CM | POA: Diagnosis not present

## 2015-05-30 DIAGNOSIS — D225 Melanocytic nevi of trunk: Secondary | ICD-10-CM | POA: Diagnosis not present

## 2015-05-30 DIAGNOSIS — L821 Other seborrheic keratosis: Secondary | ICD-10-CM | POA: Diagnosis not present

## 2015-05-30 DIAGNOSIS — L570 Actinic keratosis: Secondary | ICD-10-CM | POA: Diagnosis not present

## 2015-05-30 DIAGNOSIS — D1801 Hemangioma of skin and subcutaneous tissue: Secondary | ICD-10-CM | POA: Diagnosis not present

## 2015-05-30 DIAGNOSIS — Z85828 Personal history of other malignant neoplasm of skin: Secondary | ICD-10-CM | POA: Diagnosis not present

## 2015-05-30 DIAGNOSIS — D2272 Melanocytic nevi of left lower limb, including hip: Secondary | ICD-10-CM | POA: Diagnosis not present

## 2015-06-20 ENCOUNTER — Telehealth: Payer: Self-pay | Admitting: Family Medicine

## 2015-06-20 NOTE — Telephone Encounter (Signed)
Symptoms for only four days and sound like viral respiratory illness including some laryngitis.  Discussed.  No antibiotics for now.  Call back if worsens or fails to improve.

## 2015-06-20 NOTE — Telephone Encounter (Signed)
Sara Russell need an antibiotic sent to her pharmacy.  C/O of congestion.

## 2015-06-22 MED ORDER — AZITHROMYCIN 250 MG PO TABS: ORAL_TABLET | ORAL | Status: DC

## 2015-06-22 NOTE — Addendum Note (Signed)
Addended by: Zenia Resides on: 06/22/2015 04:48 PM   Modules accepted: Orders

## 2015-06-22 NOTE — Telephone Encounter (Signed)
Patient is no better. Would now like antibiotics.

## 2015-06-23 ENCOUNTER — Telehealth: Payer: Self-pay | Admitting: *Deleted

## 2015-06-23 NOTE — Telephone Encounter (Signed)
Done.  Derl Barrow, RN

## 2015-06-23 NOTE — Telephone Encounter (Signed)
Received a fax from [place] stating the Rx for azithromycin may have a potential drug-drug interaction with amiodarone.  It may cause QT prolongation.  Please advise.  Derl Barrow, RN

## 2015-06-23 NOTE — Telephone Encounter (Signed)
I am aware of this interaction and used the lower dose of azithro.  OK to fill RX.

## 2015-07-27 ENCOUNTER — Telehealth: Payer: Self-pay | Admitting: Family Medicine

## 2015-07-27 ENCOUNTER — Other Ambulatory Visit: Payer: Self-pay | Admitting: Family Medicine

## 2015-07-27 NOTE — Telephone Encounter (Signed)
Needs refill on levothyroxine.  [facility] [address]

## 2015-07-27 NOTE — Telephone Encounter (Signed)
Levothyroxine refiled by PCP at [phone] today. Velora Heckler, RN

## 2015-10-24 ENCOUNTER — Other Ambulatory Visit: Payer: Self-pay | Admitting: Family Medicine

## 2015-11-07 ENCOUNTER — Ambulatory Visit: Payer: Medicare Other | Admitting: *Deleted

## 2015-11-21 ENCOUNTER — Encounter (HOSPITAL_COMMUNITY): Payer: Self-pay | Admitting: Nurse Practitioner

## 2015-11-21 ENCOUNTER — Ambulatory Visit (HOSPITAL_COMMUNITY)
Admission: RE | Admit: 2015-11-21 | Discharge: 2015-11-21 | Disposition: A | Discharge status: Home / Self Care | Payer: Medicare Other | Source: Ambulatory Visit | Attending: Nurse Practitioner | Admitting: Nurse Practitioner

## 2015-11-21 VITALS — BP 164/84 | HR 65 | Ht 62.0 in | Wt 139.8 lb

## 2015-11-21 DIAGNOSIS — I481 Persistent atrial fibrillation: Secondary | ICD-10-CM | POA: Insufficient documentation

## 2015-11-21 DIAGNOSIS — K219 Gastro-esophageal reflux disease without esophagitis: Secondary | ICD-10-CM | POA: Insufficient documentation

## 2015-11-21 DIAGNOSIS — I4819 Other persistent atrial fibrillation: Secondary | ICD-10-CM

## 2015-11-21 DIAGNOSIS — Z86718 Personal history of other venous thrombosis and embolism: Secondary | ICD-10-CM | POA: Insufficient documentation

## 2015-11-21 DIAGNOSIS — Z823 Family history of stroke: Secondary | ICD-10-CM | POA: Diagnosis not present

## 2015-11-21 DIAGNOSIS — Z86711 Personal history of pulmonary embolism: Secondary | ICD-10-CM | POA: Diagnosis not present

## 2015-11-21 DIAGNOSIS — Z881 Allergy status to other antibiotic agents status: Secondary | ICD-10-CM | POA: Diagnosis not present

## 2015-11-21 DIAGNOSIS — Z882 Allergy status to sulfonamides status: Secondary | ICD-10-CM | POA: Insufficient documentation

## 2015-11-21 DIAGNOSIS — Z85828 Personal history of other malignant neoplasm of skin: Secondary | ICD-10-CM | POA: Diagnosis not present

## 2015-11-21 DIAGNOSIS — D509 Iron deficiency anemia, unspecified: Secondary | ICD-10-CM | POA: Insufficient documentation

## 2015-11-21 DIAGNOSIS — E785 Hyperlipidemia, unspecified: Secondary | ICD-10-CM | POA: Diagnosis not present

## 2015-11-21 DIAGNOSIS — Z8249 Family history of ischemic heart disease and other diseases of the circulatory system: Secondary | ICD-10-CM | POA: Diagnosis not present

## 2015-11-21 DIAGNOSIS — Z7901 Long term (current) use of anticoagulants: Secondary | ICD-10-CM | POA: Insufficient documentation

## 2015-11-21 DIAGNOSIS — Z87891 Personal history of nicotine dependence: Secondary | ICD-10-CM | POA: Insufficient documentation

## 2015-11-21 DIAGNOSIS — Z91013 Allergy to seafood: Secondary | ICD-10-CM | POA: Diagnosis not present

## 2015-11-21 DIAGNOSIS — Z91041 Radiographic dye allergy status: Secondary | ICD-10-CM | POA: Diagnosis not present

## 2015-11-21 DIAGNOSIS — Z888 Allergy status to other drugs, medicaments and biological substances status: Secondary | ICD-10-CM | POA: Diagnosis not present

## 2015-11-21 DIAGNOSIS — E039 Hypothyroidism, unspecified: Secondary | ICD-10-CM | POA: Insufficient documentation

## 2015-11-21 DIAGNOSIS — I10 Essential (primary) hypertension: Secondary | ICD-10-CM | POA: Insufficient documentation

## 2015-11-21 DIAGNOSIS — Z800 Family history of malignant neoplasm of digestive organs: Secondary | ICD-10-CM | POA: Diagnosis not present

## 2015-11-21 DIAGNOSIS — Z96652 Presence of left artificial knee joint: Secondary | ICD-10-CM | POA: Diagnosis not present

## 2015-11-21 LAB — COMPREHENSIVE METABOLIC PANEL
ALT: 24 U/L (ref 14–54)
AST: 28 U/L (ref 15–41)
Albumin: 4 g/dL (ref 3.5–5.0)
Alkaline Phosphatase: 72 U/L (ref 38–126)
Anion gap: 7 (ref 5–15)
BUN: 16 mg/dL (ref 6–20)
CO2: 25 mmol/L (ref 22–32)
Calcium: 9.1 mg/dL (ref 8.9–10.3)
Chloride: 102 mmol/L (ref 101–111)
Creatinine, Ser: 1.25 mg/dL — ABNORMAL HIGH (ref 0.44–1.00)
GFR calc Af Amer: 45 mL/min — ABNORMAL LOW (ref >60)
GFR calc non Af Amer: 39 mL/min — ABNORMAL LOW (ref >60)
Glucose, Bld: 101 mg/dL — ABNORMAL HIGH (ref 65–99)
Potassium: 3.7 mmol/L (ref 3.5–5.1)
Sodium: 134 mmol/L — ABNORMAL LOW (ref 135–145)
Total Bilirubin: 0.6 mg/dL (ref 0.3–1.2)
Total Protein: 7.1 g/dL (ref 6.5–8.1)

## 2015-11-21 LAB — CBC
HCT: 34.3 % — ABNORMAL LOW (ref 36.0–46.0)
Hemoglobin: 11.4 g/dL — ABNORMAL LOW (ref 12.0–15.0)
MCH: 31.6 pg (ref 26.0–34.0)
MCHC: 33.2 g/dL (ref 30.0–36.0)
MCV: 95 fL (ref 78.0–100.0)
Platelets: 213 10*3/uL (ref 150–400)
RBC: 3.61 MIL/uL — ABNORMAL LOW (ref 3.87–5.11)
RDW: 12.9 % (ref 11.5–15.5)
WBC: 5.8 10*3/uL (ref 4.0–10.5)

## 2015-11-21 LAB — TSH: TSH: 10.451 u[IU]/mL — ABNORMAL HIGH (ref 0.350–4.500)

## 2015-11-21 NOTE — Progress Notes (Signed)
Primary Care Physician: Zigmund Gottron, MD Primary Electrophysiologist: Coral Ceo VONZELLA ALTHAUS is a 82 y.o. female with a history of persistent atrial fibrillation who presents for follow up in the [facility].  Since last being seen in clinic, the patient reports doing very well. She remains active at [employer] She has had some darker stools than normal and asks about checking a CBC today.  Today, she denies symptoms of palpitations, chest pain, shortness of breath, orthopnea, PND, lower extremity edema, dizziness, presyncope, syncope, snoring, daytime somnolence, bleeding, or neurologic sequela. The patient is tolerating medications without difficulties and is otherwise without complaint today.    Past Medical History:  Diagnosis Date  . Abdominal mass   . AKI (acute kidney injury) ([place]) 09/15/2014  . Atrial fibrillation ([place])   . Colitis, ulcerative ([place])   . Cough    WHITE SPUTUM FOR 2 WEEKS, NO FEVER  . Degenerative joint disease   . Diverticulosis   . DVT (deep venous thrombosis) ([place]) 2009   BOTH LUNGS  . GERD (gastroesophageal reflux disease)   . Hyperlipidemia   . Hyperplastic colon polyp   . Hypertension   . Hypothyroidism   . Iron deficiency anemia    ON IRON AT TIMES  . PE (pulmonary embolism) 2009  . Persistent atrial fibrillation ([place])   . Positive H. pylori test 01/20/96  . S/P dilatation of esophageal stricture 1992  . Skin cancer    h/o basal and squamous skin cancer--> removed   Past Surgical History:  Procedure Laterality Date  . APPENDECTOMY    . BREAST LUMPECTOMY     benign  . DILATION AND CURETTAGE OF UTERUS     for endometrial polyps  . ENDOSCOPIC RETROGRADE CHOLANGIOPANCREATOGRAPHY (ERCP) WITH PROPOFOL N/A 03/23/2015   Procedure: ENDOSCOPIC RETROGRADE CHOLANGIOPANCREATOGRAPHY (ERCP) WITH PROPOFOL;  Surgeon: Milus Banister, MD;  Location: [facility];  Service: Endoscopy;  Laterality: N/A;  . JOINT REPLACEMENT     . ORIF ANKLE FRACTURE Left    X 2  . REPLACEMENT TOTAL KNEE     left  . SURGERY DONE TO REMOVE LEFT ANKLE HARDWARE LATER    . TONSILLECTOMY      Current Outpatient Prescriptions  Medication Sig Dispense Refill  . acetaminophen (TYLENOL ARTHRITIS PAIN) 650 MG CR tablet Take 650 mg by mouth daily.    Marland Kitchen amiodarone (PACERONE) 200 MG tablet TAKE ONE TABLET BY MOUTH ONCE DAILY 90 tablet 3  . amLODipine (NORVASC) 5 MG tablet Take 5 mg by mouth daily.    Marland Kitchen apixaban (ELIQUIS) 2.5 MG TABS tablet Take 1 tablet (2.5 mg total) by mouth 2 (two) times daily. 180 tablet 3  . atorvastatin (LIPITOR) 10 MG tablet TAKE ONE TABLET BY MOUTH ONCE DAILY AT  6PM 90 tablet 3  . carvedilol (COREG) 6.25 MG tablet Take 1 tablet (6.25 mg total) by mouth 2 (two) times daily with a meal. 180 tablet 3  . diphenhydramine-acetaminophen (TYLENOL PM) 25-500 MG TABS tablet Take 1 tablet by mouth at bedtime as needed.    . furosemide (LASIX) 20 MG tablet Take 20 mg by mouth as needed.    Marland Kitchen levothyroxine (SYNTHROID, LEVOTHROID) 25 MCG tablet TAKE ONE TABLET BY MOUTH ONCE DAILY BEFORE BREAKFAST 90 tablet 3  . omeprazole (PRILOSEC) 40 MG capsule Take 1 capsule (40 mg total) by mouth daily. 90 capsule 3  . saccharomyces boulardii (FLORASTOR) 250 MG capsule Take 1 capsule (250 mg total) by mouth 2 (two) times  daily.     No current facility-administered medications for this encounter.     Allergies  Allergen Reactions  . Iohexol Other (See Comments)    unknown  . Ivp Dye [Iodinated Diagnostic Agents] Hives    Was able to tolerate Contrast Media for CT scan.  [name] [Shellfish Allergy] Other (See Comments)    Projectile vomiting.  . Sulfa Antibiotics Hives  . Sulfamethoxazole Hives    REACTION: unspecified    Social History   Social History  . Marital status: Widowed    Spouse name: N/A  . Number of children: 3  . Years of education: college   Occupational History  . retired Marine scientist Retired   Social History  Main Topics  . Smoking status: Former Smoker    Packs/day: 0.50    Years: 13.00    Types: Cigarettes    Start date: 02/12/1948    Quit date: 02/11/1961  . Smokeless tobacco: Never Used     Comment: Denies use of tobacco since 1963  . Alcohol use 0.0 oz/week    7 - 14 Glasses of wine per week     Comment: WINE FEW TIMES PER WEEK  . Drug use: No  . Sexual activity: Not Currently   Other Topics Concern  . Not on file   Social History Narrative   Health Care POA:    Emergency Contact: son, Mayari Matus (c) [phone] [name])   End of Life Plan:    Who lives with you: self   Any pets: none   Diet: Pt has a varied diet of protein, starch, vegetables.   Exercise: Pt has no regular exercise plan because of pain in back.   Seatbelts: Pt reports wearing seatbelt when in vehicle.   Nancy Fetter Exposure/Protection: Pt reports wearing sun screen.   Hobbies: reading, traveling, eating out.      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 Esophageal cancer Brother   . Diverticulitis Brother   . Colon cancer Neg Hx   . Heart disease Mother   . Stroke Mother     ROS- All systems are reviewed and negative except as per the HPI above.  Physical Exam: Vitals:   11/21/15 1033  BP: (!) 164/84  Pulse: 65  Weight: 139 lb 12.8 oz (63.4 kg)  Height: 5' 2"  (1.575 m)    GEN- The patient is elderly appearing, alert and oriented x 3 today.   Head- normocephalic, atraumatic Eyes-  Sclera clear, conjunctiva pink Ears- hearing intact Oropharynx- clear Neck- supple  Lungs- Clear to ausculation bilaterally, normal work of breathing Heart- Regular rate and rhythm  GI- soft, NT, ND, + BS Extremities- no clubbing, cyanosis, trace BLE edema MS- no significant deformity or atrophy Skin- no rash or lesion Psych- euthymic mood, full affect Neuro-  strength and sensation are intact  Wt Readings from Last 3 Encounters:  11/21/15 139 lb 12.8 oz (63.4 kg)  05/25/15 132 lb (59.9 kg)  03/23/15 134 lb (60.8 kg)    EKG today demonstrates sinus rhythm, QTc 46mec Echo 09/2014 demonstrated EF 45-50%, septal hypokinesis, mild MR, LA severely dilated  Epic records are reviewed at length today  Assessment and Plan:  1. Persistent atrial fibrillation Maintaining SR on low dose amiodarone Will update TSH, LFT's today Continue Eliquis  for CHADS2VASC of 4 (decrease dose 2/2 age and weight)  2. HTN Stable No change required today  CBC, CMET, TSH today Follow up with AF clinic in 6 months   Chanetta Marshall, NP 11/21/2015 12:17 PM

## 2015-11-22 ENCOUNTER — Other Ambulatory Visit: Payer: Self-pay | Admitting: Family Medicine

## 2015-11-22 ENCOUNTER — Encounter: Payer: Self-pay | Admitting: Family Medicine

## 2015-11-22 DIAGNOSIS — E031 Congenital hypothyroidism without goiter: Secondary | ICD-10-CM

## 2015-11-22 MED ORDER — LEVOTHYROXINE SODIUM 25 MCG PO TABS: 50.0000 ug | ORAL_TABLET | Freq: Every day | ORAL | 3 refills | Status: DC

## 2015-11-28 ENCOUNTER — Ambulatory Visit (INDEPENDENT_AMBULATORY_CARE_PROVIDER_SITE_OTHER): Payer: Medicare Other | Admitting: *Deleted

## 2015-11-28 ENCOUNTER — Encounter: Payer: Self-pay | Admitting: *Deleted

## 2015-11-28 VITALS — BP 174/68 | HR 61 | Temp 97.8°F | Ht 60.0 in | Wt 141.6 lb

## 2015-11-28 DIAGNOSIS — Z23 Encounter for immunization: Secondary | ICD-10-CM | POA: Diagnosis not present

## 2015-11-28 DIAGNOSIS — Z0000 Encounter for general adult medical examination without abnormal findings: Secondary | ICD-10-CM

## 2015-11-28 NOTE — Progress Notes (Signed)
Subjective:   Sara Russell is a 82 y.o. female who presents for Medicare Annual (Subsequent) preventive examination.  Cardiac Risk Factors include: advanced age (>47mn, >>35women);hypertension;sedentary lifestyle;smoking/ tobacco exposure;dyslipidemia     Objective:     Vitals: BP (!) 174/68 (BP Location: Left Arm, Cuff Size: Normal)   Pulse 61   Temp 97.8 F (36.6 C) (Oral)   Ht 5' (1.524 m)   Wt 141 lb 9.6 oz (64.2 kg)   SpO2 98%   BMI 27.65 kg/m   Body mass index is 27.65 kg/m.  Initial BP 162/55. BP recheck 174/68  left arm manually with adult cuff.  Patient has appt with nephrologist next week.  Tobacco History  Smoking Status  . Former Smoker  . Packs/day: 0.50  . Years: 13.00  . Types: Cigarettes  . Start date: 02/12/1948  . Quit date: 02/11/1961  Smokeless Tobacco  . Never Used    Comment: Denies use of tobacco since 1963     Counseling given: Yes Patient is former smoker with no plans to restart   Past Medical History:  Diagnosis Date  . Abdominal mass   . AKI ([place]) [place] 09/15/2014  . Atrial fibrillation ([place]   . [place]itis[place] ulcerative ([place]   . Cough    WHITE SPUTUM FOR 2 WEEKS, NO FEVER  . Degenerative joint disease   . Diverticulosis   . DVT (deep venous thrombosis) ([place] 2009   BOTH LUNGS  . GERD (gastroesophageal reflux disease)   . Hyperlipidemia   . Hyperplastic colon polyp   . Hypertension   . Hypothyroidism   . Iron deficiency anemia    ON IRON AT TIMES  . PE (pulmonary embolism) 2009  . Persistent atrial fibrillation ([place]   . Positive H. pylori test 01/20/96  . S/P dilatation of esophageal stricture 1992  . Skin cancer    h/o basal and squamous skin cancer--> removed   Past Surgical History:  Procedure Laterality Date  . APPENDECTOMY    . BREAST LUMPECTOMY     benign  . DILATION AND CURETTAGE OF UTERUS     for endometrial polyps  . ENDOSCOPIC RETROGRADE CHOLANGIOPANCREATOGRAPHY (ERCP) WITH PROPOFOL N/A  03/23/2015   Procedure: ENDOSCOPIC RETROGRADE CHOLANGIOPANCREATOGRAPHY (ERCP) WITH PROPOFOL;  Surgeon: DMilus Banister MD;  Location: [facility];  Service: Endoscopy;  Laterality: N/A;  . JOINT REPLACEMENT    . ORIF ANKLE FRACTURE Left    X 2  . REPLACEMENT TOTAL KNEE     left  . SURGERY DONE TO REMOVE LEFT ANKLE HARDWARE LATER    . TONSILLECTOMY     Family History  Problem Relation Age of Onset  . Esophageal cancer Brother   . Diverticulitis Brother   . Heart disease Mother   . Stroke Mother   . Heart disease Father   . Atrial fibrillation Father   . Colon cancer Neg Hx    History  Sexual Activity  . Sexual activity: No    Outpatient Encounter Prescriptions as of 11/28/2015  Medication Sig  . acetaminophen (TYLENOL ARTHRITIS PAIN) 650 MG CR tablet Take 650 mg by mouth daily.  .Marland Kitchenamiodarone (PACERONE) 200 MG tablet TAKE ONE TABLET BY MOUTH ONCE DAILY  . amLODipine (NORVASC) 5 MG tablet Take 5 mg by mouth daily.  .Marland Kitchenapixaban (ELIQUIS) 2.5 MG TABS tablet Take 1 tablet (2.5 mg total) by mouth 2 (two) times daily.  .Marland Kitchenatorvastatin (LIPITOR) 10 MG tablet TAKE ONE TABLET BY MOUTH ONCE  DAILY AT  6PM  . carvedilol (COREG) 6.25 MG tablet Take 1 tablet (6.25 mg total) by mouth 2 (two) times daily with a meal.  . diphenhydramine-acetaminophen (TYLENOL PM) 25-500 MG TABS tablet Take 1 tablet by mouth at bedtime as needed.  . furosemide (LASIX) 20 MG tablet Take 20 mg by mouth as needed.  Marland Kitchen levothyroxine (SYNTHROID, LEVOTHROID) 25 MCG tablet Take 2 tablets (50 mcg total) by mouth daily before breakfast.  . methylcellulose oral powder Take by mouth daily.  Marland Kitchen omeprazole (PRILOSEC) 40 MG capsule Take 1 capsule (40 mg total) by mouth daily.  Marland Kitchen saccharomyces boulardii (FLORASTOR) 250 MG capsule Take 1 capsule (250 mg total) by mouth 2 (two) times daily. (Patient not taking: Reported on 11/28/2015)   No facility-administered encounter medications on file as of 11/28/2015.     Activities of  Daily Living In your present state of health, do you have any difficulty performing the following activities: 11/28/2015  Hearing? Y  Vision? Y  Difficulty concentrating or making decisions? N  Walking or climbing stairs? N  Dressing or bathing? N  Doing errands, shopping? N  Preparing Food and eating ? N  Using the Toilet? N  In the past six months, have you accidently leaked urine? N  Do you have problems with loss of bowel control? N  Managing your Medications? N  Managing your Finances? N  Housekeeping or managing your Housekeeping? N  Some recent data might be hidden   Home Safety:  My home has a working smoke alarm:  Yes in every room           My home throw rugs have been fastened down to the floor or removed:  Removed I have non-slip mats in the bathtub and shower:  Yes         All my home's stairs have railings or bannisters: One level home with no outside steps         My home's floors, stairs and hallways are free from clutter, wires and cords:  Yes     I wear seatbelts consistently:  Yes   Patient Care Team: Zenia Resides, MD as PCP - General Benard Rink (Dentistry) Heather [place], [place] (Optometry) Sydnee Levans, MD (Dermatology) Thompson Grayer, MD as Consulting Physician (Cardiology) Fleet Contras, MD as Consulting Physician (Nephrology)    Assessment:     Exercise Activities and Dietary recommendations Current Exercise Habits: The patient does not participate in regular exercise at present, Exercise limited by: orthopedic condition(s) (arthritis and compression fx of L1)  Goals    . Blood Pressure < 150/90      Fall Risk Fall Risk  11/28/2015 03/01/2015 02/16/2015 11/17/2014 05/13/2014  Falls in the past year? No No No Yes No  Number falls in past yr: - - - (No Data) -   Depression Screen PHQ 2/9 Scores 11/28/2015 03/01/2015 02/16/2015 11/17/2014  PHQ - 2 Score 0 0 0 0    TUG Test:  Done in 13 seconds. Patient used both hands to push out of chair and  to sit back down. Balanced self on counter. Falls prevention discussed and literature given. PCP notified of fall risk status.  Cognitive Function: Mini-Cog  Passed with score 3/5  Cognitive Testing MMSE - Mini Mental State Exam 11/26/2010  Orientation to time 5  Orientation to Place 5  Registration 3  Attention/ Calculation 5  Recall 3  Language- name 2 objects 2  Language- repeat 1  Language- follow 3 step command  3  Language- read & follow direction 1  Write a sentence 1  Copy design 1  Total score 30    Immunization History  Administered Date(s) Administered  . H1N1 03/04/2008  . Influenza Split 11/12/2010, 11/26/2011  . Influenza Whole 11/26/2006, 11/03/2007, 12/10/2007, 11/16/2008, 11/20/2009  . Influenza,inj,Quad PF,36+ Mos 11/24/2012, 11/29/2013, 10/14/2014  . PPD Test 10/03/2014  . Pneumococcal Conjugate-13 09/10/2013  . Pneumococcal Polysaccharide-23 02/12/2000  . Td 02/11/1998, 04/13/2008  . Zoster 05/08/2006   Screening Tests Health Maintenance  Topic Date Due  . INFLUENZA VACCINE  09/12/2015  . TETANUS/TDAP  04/14/2018  . DEXA SCAN  Completed  . ZOSTAVAX  Completed  . PNA vac Low Risk Adult  Completed  Flu vaccine administered today     Plan:   Patient has previously scheduled appt with nephrologist next week and will discuss elevated BPs at that time.  During the course of the visit the patient was educated and counseled about the following appropriate screening and preventive services:   Vaccines to include Pneumoccal, Influenza, Td, Zostavax  Cardiovascular Disease  Colorectal cancer screening  Bone density screening  Diabetes screening  Mammography/PAP  Nutrition counseling   Patient Instructions (the written plan) was given to the patient.   Velora Heckler, RN  11/28/2015

## 2015-11-28 NOTE — Patient Instructions (Addendum)
 Fall Prevention in the Home  Falls can cause injuries. They can happen to people of all ages. There are many things you can do to make your home safe and to help prevent falls.  WHAT CAN I DO ON THE OUTSIDE OF MY HOME?  Regularly fix the edges of walkways and driveways and fix any cracks.  Remove anything that might make you trip as you walk through a door, such as a raised step or threshold.  Trim any bushes or trees on the path to your home.  Use bright outdoor lighting.  Clear any walking paths of anything that might make someone trip, such as rocks or tools.  Regularly check to see if handrails are loose or broken. Make sure that both sides of any steps have handrails.  Any raised decks and porches should have guardrails on the edges.  Have any leaves, snow, or ice cleared regularly.  Use sand or salt on walking paths during winter.  Clean up any spills in your garage right away. This includes oil or grease spills. WHAT CAN I DO IN THE BATHROOM?   Use night lights.  Install grab bars by the toilet and in the tub and shower. Do not use towel bars as grab bars.  Use non-skid mats or decals in the tub or shower.  If you need to sit down in the shower, use a plastic, non-slip stool.  Keep the floor dry. Clean up any water that spills on the floor as soon as it happens.  Remove soap buildup in the tub or shower regularly.  Attach bath mats securely with double-sided non-slip rug tape.  Do not have throw rugs and other things on the floor that can make you trip. WHAT CAN I DO IN THE BEDROOM?  Use night lights.  Make sure that you have a light by your bed that is easy to reach.  Do not use any sheets or blankets that are too big for your bed. They should not hang down onto the floor.  Have a firm chair that has side arms. You can use this for support while you get dressed.  Do not have throw rugs and other things on the floor that can make you trip. WHAT CAN I DO  IN THE KITCHEN?  Clean up any spills right away.  Avoid walking on wet floors.  Keep items that you use a lot in easy-to-reach places.  If you need to reach something above you, use a strong step stool that has a grab bar.  Keep electrical cords out of the way.  Do not use floor polish or wax that makes floors slippery. If you must use wax, use non-skid floor wax.  Do not have throw rugs and other things on the floor that can make you trip. WHAT CAN I DO WITH MY STAIRS?  Do not leave any items on the stairs.  Make sure that there are handrails on both sides of the stairs and use them. Fix handrails that are broken or loose. Make sure that handrails are as long as the stairways.  Check any carpeting to make sure that it is firmly attached to the stairs. Fix any carpet that is loose or worn.  Avoid having throw rugs at the top or bottom of the stairs. If you do have throw rugs, attach them to the floor with carpet tape.  Make sure that you have a light switch at the top of the stairs and the bottom of the   stairs. If you do not have them, ask someone to add them for you. WHAT ELSE CAN I DO TO HELP PREVENT FALLS?  Wear shoes that:  Do not have high heels.  Have rubber bottoms.  Are comfortable and fit you well.  Are closed at the toe. Do not wear sandals.  If you use a stepladder:  Make sure that it is fully opened. Do not climb a closed stepladder.  Make sure that both sides of the stepladder are locked into place.  Ask someone to hold it for you, if possible.  Clearly mark and make sure that you can see:  Any grab bars or handrails.  First and last steps.  Where the edge of each step is.  Use tools that help you move around (mobility aids) if they are needed. These include:  Canes.  Walkers.  Scooters.  Crutches.  Turn on the lights when you go into a dark area. Replace any light bulbs as soon as they burn out.  Set up your furniture so you have a clear  path. Avoid moving your furniture around.  If any of your floors are uneven, fix them.  If there are any pets around you, be aware of where they are.  Review your medicines with your doctor. Some medicines can make you feel dizzy. This can increase your chance of falling. Ask your doctor what other things that you can do to help prevent falls.   This information is not intended to replace advice given to you by your health care provider. Make sure you discuss any questions you have with your health care provider.   Document Released: 11/24/2008 Document Revised: 06/14/2014 Document Reviewed: 03/04/2014 Elsevier Interactive Patient Education 2016 [employer] Maintenance, Female Adopting a healthy lifestyle and getting preventive care can go a long way to promote health and wellness. Talk with your health care provider about what schedule of regular examinations is right for you. This is a good chance for you to check in with your provider about disease prevention and staying healthy. In between checkups, there are plenty of things you can do on your own. Experts have done a lot of research about which lifestyle changes and preventive measures are most likely to keep you healthy. Ask your health care provider for more information. WEIGHT AND DIET  Eat a healthy diet  Be sure to include plenty of vegetables, fruits, low-fat dairy products, and lean protein.  Do not eat a lot of foods high in solid fats, added sugars, or salt.  Get regular exercise. This is one of the most important things you can do for your health.  Most adults should exercise for at least 150 minutes each week. The exercise should increase your heart rate and make you sweat (moderate-intensity exercise).  Most adults should also do strengthening exercises at least twice a week. This is in addition to the moderate-intensity exercise.  Maintain a healthy weight  Body mass index (BMI) is a measurement that can  be used to identify possible weight problems. It estimates body fat based on height and weight. Your health care provider can help determine your BMI and help you achieve or maintain a healthy weight.  For females 81 years of age and older:   A BMI below 18.5 is considered underweight.  A BMI of 18.5 to 24.9 is normal.  A BMI of 25 to 29.9 is considered overweight.  A BMI of 30 and above is considered obese.  Watch levels  of cholesterol and blood lipids  You should start having your blood tested for lipids and cholesterol at 80 years of age, then have this test every 5 years.  You may need to have your cholesterol levels checked more often if:  Your lipid or cholesterol levels are high.  You are older than 80 years of age.  You are at high risk for heart disease.  CANCER SCREENING   Lung Cancer  Lung cancer screening is recommended for adults 53-62 years old who are at high risk for lung cancer because of a history of smoking.  A yearly low-dose CT scan of the lungs is recommended for people who:  Currently smoke.  Have quit within the past 15 years.  Have at least a 30-pack-year history of smoking. A pack year is smoking an average of one pack of cigarettes a day for 1 year.  Yearly screening should continue until it has been 15 years since you quit.  Yearly screening should stop if you develop a health problem that would prevent you from having lung cancer treatment.  Breast Cancer  Practice breast self-awareness. This means understanding how your breasts normally appear and feel.  It also means doing regular breast self-exams. Let your health care provider know about any changes, no matter how small.  If you are in your 20s or 30s, you should have a clinical breast exam (CBE) by a health care provider every 1-3 years as part of a regular health exam.  If you are 4 or older, have a CBE every year. Also consider having a breast X-ray (mammogram) every year.  If  you have a family history of breast cancer, talk to your health care provider about genetic screening.  If you are at high risk for breast cancer, talk to your health care provider about having an MRI and a mammogram every year.  Breast cancer gene (BRCA) assessment is recommended for women who have family members with BRCA-related cancers. BRCA-related cancers include:  Breast.  Ovarian.  Tubal.  Peritoneal cancers.  Results of the assessment will determine the need for genetic counseling and BRCA1 and BRCA2 testing. Cervical Cancer Your health care provider may recommend that you be screened regularly for cancer of the pelvic organs (ovaries, uterus, and vagina). This screening involves a pelvic examination, including checking for microscopic changes to the surface of your cervix (Pap test). You may be encouraged to have this screening done every 3 years, beginning at age 75.  For women ages 5-65, health care providers may recommend pelvic exams and Pap testing every 3 years, or they may recommend the Pap and pelvic exam, combined with testing for human papilloma virus (HPV), every 5 years. Some types of HPV increase your risk of cervical cancer. Testing for HPV may also be done on women of any age with unclear Pap test results.  Other health care providers may not recommend any screening for nonpregnant women who are considered low risk for pelvic cancer and who do not have symptoms. Ask your health care provider if a screening pelvic exam is right for you.  If you have had past treatment for cervical cancer or a condition that could lead to cancer, you need Pap tests and screening for cancer for at least 20 years after your treatment. If Pap tests have been discontinued, your risk factors (such as having a new sexual partner) need to be reassessed to determine if screening should resume. Some women have medical problems that increase the chance  of getting cervical cancer. In these cases,  your health care provider may recommend more frequent screening and Pap tests. Colorectal Cancer  This type of cancer can be detected and often prevented.  Routine colorectal cancer screening usually begins at 80 years of age and continues through 80 years of age.  Your health care provider may recommend screening at an earlier age if you have risk factors for colon cancer.  Your health care provider may also recommend using home test kits to check for hidden blood in the stool.  A small camera at the end of a tube can be used to examine your colon directly (sigmoidoscopy or colonoscopy). This is done to check for the earliest forms of colorectal cancer.  Routine screening usually begins at age 50.  Direct examination of the colon should be repeated every 5-10 years through 80 years of age. However, you may need to be screened more often if early forms of precancerous polyps or small growths are found. Skin Cancer  Check your skin from head to toe regularly.  Tell your health care provider about any new moles or changes in moles, especially if there is a change in a mole's shape or color.  Also tell your health care provider if you have a mole that is larger than the size of a pencil eraser.  Always use sunscreen. Apply sunscreen liberally and repeatedly throughout the day.  Protect yourself by wearing long sleeves, pants, a wide-brimmed hat, and sunglasses whenever you are outside. HEART DISEASE, DIABETES, AND HIGH BLOOD PRESSURE   High blood pressure causes heart disease and increases the risk of stroke. High blood pressure is more likely to develop in:  People who have blood pressure in the high end of the normal range (130-139/85-89 mm Hg).  People who are overweight or obese.  People who are African American.  If you are 18-39 years of age, have your blood pressure checked every 3-5 years. If you are 40 years of age or older, have your blood pressure checked every year. You  should have your blood pressure measured twice--once when you are at a hospital or clinic, and once when you are not at a hospital or clinic. Record the average of the two measurements. To check your blood pressure when you are not at a hospital or clinic, you can use:  An automated blood pressure machine at a pharmacy.  A home blood pressure monitor.  If you are between 55 years and 79 years old, ask your health care provider if you should take aspirin to prevent strokes.  Have regular diabetes screenings. This involves taking a blood sample to check your fasting blood sugar level.  If you are at a normal weight and have a low risk for diabetes, have this test once every three years after 80 years of age.  If you are overweight and have a high risk for diabetes, consider being tested at a younger age or more often. PREVENTING INFECTION  Hepatitis B  If you have a higher risk for hepatitis B, you should be screened for this virus. You are considered at high risk for hepatitis B if:  You were born in a country where hepatitis B is common. Ask your health care provider which countries are considered high risk.  Your parents were born in a high-risk country, and you have not been immunized against hepatitis B (hepatitis B vaccine).  You have HIV or AIDS.  You use needles to inject street drugs.  You   live with someone who has hepatitis B.  You have had sex with someone who has hepatitis B.  You get hemodialysis treatment.  You take certain medicines for conditions, including cancer, organ transplantation, and autoimmune conditions. Hepatitis C  Blood testing is recommended for:  Everyone born from 1945 through 1965.  Anyone with known risk factors for hepatitis C. Sexually transmitted infections (STIs)  You should be screened for sexually transmitted infections (STIs) including gonorrhea and chlamydia if:  You are sexually active and are younger than 80 years of age.  You  are older than 80 years of age and your health care provider tells you that you are at risk for this type of infection.  Your sexual activity has changed since you were last screened and you are at an increased risk for chlamydia or gonorrhea. Ask your health care provider if you are at risk.  If you do not have HIV, but are at risk, it may be recommended that you take a prescription medicine daily to prevent HIV infection. This is called pre-exposure prophylaxis (PrEP). You are considered at risk if:  You are sexually active and do not regularly use condoms or know the HIV status of your partner(s).  You take drugs by injection.  You are sexually active with a partner who has HIV. Talk with your health care provider about whether you are at high risk of being infected with HIV. If you choose to begin PrEP, you should first be tested for HIV. You should then be tested every 3 months for as long as you are taking PrEP.  PREGNANCY   If you are premenopausal and you may become pregnant, ask your health care provider about preconception counseling.  If you may become pregnant, take 400 to 800 micrograms (mcg) of folic acid every day.  If you want to prevent pregnancy, talk to your health care provider about birth control (contraception). OSTEOPOROSIS AND MENOPAUSE   Osteoporosis is a disease in which the bones lose minerals and strength with aging. This can result in serious bone fractures. Your risk for osteoporosis can be identified using a bone density scan.  If you are 65 years of age or older, or if you are at risk for osteoporosis and fractures, ask your health care provider if you should be screened.  Ask your health care provider whether you should take a calcium or vitamin D supplement to lower your risk for osteoporosis.  Menopause may have certain physical symptoms and risks.  Hormone replacement therapy may reduce some of these symptoms and risks. Talk to your health care  provider about whether hormone replacement therapy is right for you.  HOME CARE INSTRUCTIONS   Schedule regular health, dental, and eye exams.  Stay current with your immunizations.   Do not use any tobacco products including cigarettes, chewing tobacco, or electronic cigarettes.  If you are pregnant, do not drink alcohol.  If you are breastfeeding, limit how much and how often you drink alcohol.  Limit alcohol intake to no more than 1 drink per day for nonpregnant women. One drink equals 12 ounces of beer, 5 ounces of wine, or 1 ounces of hard liquor.  Do not use street drugs.  Do not share needles.  Ask your health care provider for help if you need support or information about quitting drugs.  Tell your health care provider if you often feel depressed.  Tell your health care provider if you have ever been abused or do not feel safe   at home.   This information is not intended to replace advice given to you by your health care provider. Make sure you discuss any questions you have with your health care provider.   Document Released: 08/13/2010 Document Revised: 02/18/2014 Document Reviewed: 12/30/2012 Elsevier Interactive Patient Education 2016 [employer] Inc[employer]  

## 2015-11-29 ENCOUNTER — Encounter: Payer: Self-pay | Admitting: *Deleted

## 2015-11-29 NOTE — Progress Notes (Signed)
I have reviewed this visit and discussed with Howell Rucks, RN, BSN, and agree with her documentation.   Hypertension noted.

## 2015-12-06 DIAGNOSIS — I10 Essential (primary) hypertension: Secondary | ICD-10-CM | POA: Diagnosis not present

## 2015-12-06 DIAGNOSIS — E876 Hypokalemia: Secondary | ICD-10-CM | POA: Diagnosis not present

## 2015-12-06 DIAGNOSIS — N183 Chronic kidney disease, stage 3 (moderate): Secondary | ICD-10-CM | POA: Diagnosis not present

## 2015-12-20 ENCOUNTER — Telehealth: Payer: Self-pay | Admitting: Family Medicine

## 2015-12-20 MED ORDER — LEVOTHYROXINE SODIUM 50 MCG PO TABS: 50.0000 ug | ORAL_TABLET | Freq: Every day | ORAL | 3 refills | Status: DC

## 2015-12-20 NOTE — Telephone Encounter (Signed)
Pt needs Rx for levthorxine. She says she was to go on 32m when she ran out of her 25 mg.  [facility] on [address]

## 2015-12-20 NOTE — Telephone Encounter (Signed)
done

## 2015-12-26 ENCOUNTER — Telehealth: Payer: Self-pay | Admitting: Family Medicine

## 2015-12-26 NOTE — Telephone Encounter (Signed)
Spoke to pt. I am mailing her an Advance Directive book for her to fill out. She will call if she has questions. Ottis Stain, CMA

## 2015-12-26 NOTE — Telephone Encounter (Signed)
Pt would like to talk with Dr. Andria Frames about advance directive paperwork, pt would like to have one. Please advise. Thanks! ep

## 2016-02-14 ENCOUNTER — Other Ambulatory Visit: Payer: Self-pay | Admitting: Family Medicine

## 2016-02-14 MED ORDER — AMLODIPINE BESYLATE 5 MG PO TABS: 5.0000 mg | ORAL_TABLET | Freq: Every day | ORAL | 3 refills | Status: DC

## 2016-02-14 MED ORDER — OMEPRAZOLE 40 MG PO CPDR: 40.0000 mg | DELAYED_RELEASE_CAPSULE | Freq: Every day | ORAL | 3 refills | Status: DC

## 2016-02-14 MED ORDER — LEVOTHYROXINE SODIUM 50 MCG PO TABS
50.0000 ug | ORAL_TABLET | Freq: Every day | ORAL | 3 refills | Status: DC
Start: 2016-02-14 — End: 2016-05-27

## 2016-02-14 MED ORDER — APIXABAN 2.5 MG PO TABS: 2.5000 mg | ORAL_TABLET | Freq: Two times a day (BID) | ORAL | 3 refills | Status: DC

## 2016-02-14 NOTE — Telephone Encounter (Signed)
Pt is going to start using [facility], but pt needs several medications now and would like a 90 day supply sent to [facility] on [address]. Omeprazole, amlodipine, apixaban and levothyroxine. ep

## 2016-03-25 ENCOUNTER — Telehealth: Payer: Self-pay | Admitting: Family Medicine

## 2016-03-25 DIAGNOSIS — M10039 Idiopathic gout, unspecified wrist: Secondary | ICD-10-CM

## 2016-03-25 NOTE — Telephone Encounter (Signed)
Patient states that her wrist is swollen and painful. She is currently taking Tylenol for arthritis but is wanting to know if there is anything else that she can take. Please advise.

## 2016-03-28 NOTE — Telephone Encounter (Signed)
Right lateral wrist inflamed and sore.  She (former Therapist, sports) feels either gout or arthritis.  Does not feel infected.   Recommended ice and given option of cochicine or prednisone.  She will just ice for now and call back if she wants meds.

## 2016-03-28 NOTE — Telephone Encounter (Signed)
The swelling has gotten worse and has gone up her arm and into her fingers. Please let her know what needs to be done.

## 2016-03-29 MED ORDER — PREDNISONE 20 MG PO TABS: 20.0000 mg | ORAL_TABLET | Freq: Two times a day (BID) | ORAL | 0 refills | Status: AC

## 2016-03-29 NOTE — Telephone Encounter (Signed)
Done and patient notified.

## 2016-03-29 NOTE — Telephone Encounter (Signed)
Pt states her arm is no better.  Please order the medication dr Andria Frames feels most appropriate.  [facility] at the [address] of [address]

## 2016-03-29 NOTE — Addendum Note (Signed)
Addended by: Zenia Resides on: 03/29/2016 11:45 AM   Modules accepted: Orders

## 2016-04-05 ENCOUNTER — Telehealth: Payer: Self-pay | Admitting: Family Medicine

## 2016-04-05 NOTE — Telephone Encounter (Signed)
p is calling to inform the doctor that she is feeling 100% better and the OTC, [employer] Ect worked. jw

## 2016-04-08 NOTE — Telephone Encounter (Signed)
Great!

## 2016-04-15 ENCOUNTER — Emergency Department (HOSPITAL_COMMUNITY)
Admission: EM | Admit: 2016-04-15 | Discharge: 2016-04-15 | Disposition: A | Discharge status: Home / Self Care | Payer: Medicare Other | Attending: Emergency Medicine | Admitting: Emergency Medicine

## 2016-04-15 ENCOUNTER — Encounter (HOSPITAL_COMMUNITY): Payer: Self-pay | Admitting: Emergency Medicine

## 2016-04-15 ENCOUNTER — Emergency Department (HOSPITAL_COMMUNITY): Payer: Medicare Other

## 2016-04-15 ENCOUNTER — Telehealth: Payer: Self-pay | Admitting: Family Medicine

## 2016-04-15 DIAGNOSIS — Y929 Unspecified place or not applicable: Secondary | ICD-10-CM | POA: Diagnosis not present

## 2016-04-15 DIAGNOSIS — M79602 Pain in left arm: Secondary | ICD-10-CM | POA: Diagnosis not present

## 2016-04-15 DIAGNOSIS — Y999 Unspecified external cause status: Secondary | ICD-10-CM | POA: Insufficient documentation

## 2016-04-15 DIAGNOSIS — Z87891 Personal history of nicotine dependence: Secondary | ICD-10-CM | POA: Insufficient documentation

## 2016-04-15 DIAGNOSIS — I110 Hypertensive heart disease with heart failure: Secondary | ICD-10-CM | POA: Insufficient documentation

## 2016-04-15 DIAGNOSIS — Z85828 Personal history of other malignant neoplasm of skin: Secondary | ICD-10-CM | POA: Insufficient documentation

## 2016-04-15 DIAGNOSIS — S46212A Strain of muscle, fascia and tendon of other parts of biceps, left arm, initial encounter: Secondary | ICD-10-CM | POA: Diagnosis not present

## 2016-04-15 DIAGNOSIS — X500XXA Overexertion from strenuous movement or load, initial encounter: Secondary | ICD-10-CM | POA: Diagnosis not present

## 2016-04-15 DIAGNOSIS — Y939 Activity, unspecified: Secondary | ICD-10-CM | POA: Insufficient documentation

## 2016-04-15 DIAGNOSIS — I5032 Chronic diastolic (congestive) heart failure: Secondary | ICD-10-CM | POA: Diagnosis not present

## 2016-04-15 DIAGNOSIS — E039 Hypothyroidism, unspecified: Secondary | ICD-10-CM | POA: Diagnosis not present

## 2016-04-15 DIAGNOSIS — Z96652 Presence of left artificial knee joint: Secondary | ICD-10-CM | POA: Diagnosis not present

## 2016-04-15 DIAGNOSIS — S4992XA Unspecified injury of left shoulder and upper arm, initial encounter: Secondary | ICD-10-CM | POA: Diagnosis present

## 2016-04-15 DIAGNOSIS — Z7901 Long term (current) use of anticoagulants: Secondary | ICD-10-CM | POA: Insufficient documentation

## 2016-04-15 NOTE — ED Notes (Signed)
Wheeled pt back to room from waiting room. Pt placed in gown.

## 2016-04-15 NOTE — Telephone Encounter (Signed)
Pt was making the bed this morning and heard a rip. Pt believes she tore something in her arm. Pt wants Dr. Andria Frames to let her know what she should do. Pt would like Dr. Andria Frames to call her. ep

## 2016-04-15 NOTE — Telephone Encounter (Signed)
Pt called and wanted the doctor to know that she will going to the ER since she is taking eliquis and wants to be sure she is not bleeding internally. jw

## 2016-04-15 NOTE — Telephone Encounter (Signed)
Noted.  ER evaluation was reassuring.

## 2016-04-15 NOTE — Discharge Instructions (Signed)
Avoid using the left arm for lifting, chores, etc. You may develop some bruising under the skin from Eliquis if you have a muscle tear. Tylenol for pain.

## 2016-04-15 NOTE — ED Provider Notes (Signed)
[facility] DEPT Provider Note   CSN: [id number] Arrival date & time: 04/15/16  1420   By signing my name below, I, Eunice Blase, attest that this documentation has been prepared under the direction and in the presence of Tanna Furry, MD. Electronically signed, Eunice Blase, ED Scribe. 04/15/16. 4:02 PM.  History   Chief Complaint Chief Complaint  Patient presents with  . Arm Pain   The history is provided by the patient and medical records. No language interpreter was used.    HPI Comments: Sara Russell is a 83 y.o. female who presents to the Emergency Department complaining of sudden onset left arm pain today. She states she was lifting her mattress with the left hand when she felt a "tear" near her left elbow. She further notes pushing motions with the left arm and grasping movements with the left hand exacerbate her pain. Pt is reportedly right handed. She adds that she is on prescription Eliquis and toradol at home.  Past Medical History:  Diagnosis Date  . Abdominal mass   . AKI (acute kidney injury) ([place]) 09/15/2014  . Atrial fibrillation ([place])   . Colitis, ulcerative ([place])   . Cough    WHITE SPUTUM FOR 2 WEEKS, NO FEVER  . Degenerative joint disease   . Diverticulosis   . DVT (deep venous thrombosis) ([place]) 2009   BOTH LUNGS  . GERD (gastroesophageal reflux disease)   . Hyperlipidemia   . Hyperplastic colon polyp   . Hypertension   . Hypothyroidism   . Iron deficiency anemia    ON IRON AT TIMES  . PE (pulmonary embolism) 2009  . Persistent atrial fibrillation ([place])   . Positive H. pylori test 01/20/96  . S/P dilatation of esophageal stricture 1992  . Skin cancer    h/o basal and squamous skin cancer--> removed    Patient Active Problem List   Diagnosis Date Noted  . Hypothyroidism 04/04/2015  . Compression fracture of L1 lumbar vertebra (HCC) 02/23/2015  . Choledocholithiasis 02/23/2015  . Colitis presumed infectious 02/17/2015  . Encounter for  palliative care   . AKI (acute kidney injury) ([place]) 09/15/2014  . Chronic diastolic congestive heart failure ([place])   . Osteoporosis 05/13/2014  . Gout 08/16/2013  . Hyponatremia 08/10/2013  . Left atrial enlargement 01/10/2013  . A-fib ([place]) 12/08/2012  . Rosacea 05/15/2012  . Anemia due to chronic blood loss 08/28/2010  . DEGENERATIVE DISC DISEASE, LUMBAR SPINE 07/14/2009  . DVT 07/11/2009  . GERD 07/11/2009  . PERSONAL HX COLONIC POLYPS 07/11/2009  . HYPERCHOLESTEROLEMIA 04/10/2006  . HYPERTENSION, BENIGN SYSTEMIC 04/10/2006  . History of pulmonary embolism 04/10/2006  . DIVERTICULOSIS OF COLON 04/10/2006  . ACTINIC KERATOSIS 04/10/2006  . GEN OSTEOARTHROSIS INVOLVING MULTIPLE SITES 04/10/2006    Past Surgical History:  Procedure Laterality Date  . APPENDECTOMY    . BREAST LUMPECTOMY     benign  . DILATION AND CURETTAGE OF UTERUS     for endometrial polyps  . ENDOSCOPIC RETROGRADE CHOLANGIOPANCREATOGRAPHY (ERCP) WITH PROPOFOL N/A 03/23/2015   Procedure: ENDOSCOPIC RETROGRADE CHOLANGIOPANCREATOGRAPHY (ERCP) WITH PROPOFOL;  Surgeon: Milus Banister, MD;  Location: [facility] ENDOSCOPY;  Service: Endoscopy;  Laterality: N/A;  . JOINT REPLACEMENT    . ORIF ANKLE FRACTURE Left    X 2  . REPLACEMENT TOTAL KNEE     left  . SURGERY DONE TO REMOVE LEFT ANKLE HARDWARE LATER    . TONSILLECTOMY      OB History    No data available  Home Medications    Prior to Admission medications   Medication Sig Start Date End Date Taking? Authorizing Provider  acetaminophen (TYLENOL ARTHRITIS PAIN) 650 MG CR tablet Take 650 mg by mouth daily.    Historical Provider, MD  amiodarone (PACERONE) 200 MG tablet TAKE ONE TABLET BY MOUTH ONCE DAILY 10/25/15   Zenia Resides, MD  amLODipine (NORVASC) 5 MG tablet Take 1 tablet (5 mg total) by mouth daily. 02/14/16   Zenia Resides, MD  apixaban (ELIQUIS) 2.5 MG TABS tablet Take 1 tablet (2.5 mg total) by mouth 2 (two) times daily. 02/14/16   Zenia Resides, MD  atorvastatin (LIPITOR) 10 MG tablet TAKE ONE TABLET BY MOUTH ONCE DAILY AT  [facility] 05/01/15   Zenia Resides, MD  carvedilol (COREG) 6.25 MG tablet Take 1 tablet (6.25 mg total) by mouth 2 (two) times daily with a meal. 05/01/15   Zenia Resides, MD  diphenhydramine-acetaminophen (TYLENOL PM) 25-500 MG TABS tablet Take 1 tablet by mouth at bedtime as needed.    Historical Provider, MD  furosemide (LASIX) 20 MG tablet Take 20 mg by mouth as needed.    Historical Provider, MD  levothyroxine (SYNTHROID, LEVOTHROID) 50 MCG tablet Take 1 tablet (50 mcg total) by mouth daily. 02/14/16   Zenia Resides, MD  methylcellulose oral powder Take by mouth daily.    Historical Provider, MD  omeprazole (PRILOSEC) 40 MG capsule Take 1 capsule (40 mg total) by mouth daily. 02/14/16   Zenia Resides, MD  saccharomyces boulardii (FLORASTOR) 250 MG capsule Take 1 capsule (250 mg total) by mouth 2 (two) times daily. Patient not taking: Reported on 11/28/2015 10/06/14   Bary Leriche, PA-C    Family History Family History  Problem Relation Age of Onset  . Esophageal cancer Brother   . Diverticulitis Brother   . Heart disease Mother   . Stroke Mother   . Heart disease Father   . Atrial fibrillation Father   . Colon cancer Neg Hx     Social History Social History  Substance Use Topics  . Smoking status: Former Smoker    Packs/day: 0.50    Years: 13.00    Types: Cigarettes    Start date: 02/12/1948    Quit date: 02/11/1961  . Smokeless tobacco: Never Used     Comment: Denies use of tobacco since 1963  . Alcohol use 3.0 oz/week    5 Glasses of wine per week     Comment: WINE FEW TIMES PER WEEK     Allergies   Iohexol; Ivp dye [iodinated diagnostic agents]; Scallops [shellfish allergy]; Sulfa antibiotics; and Sulfamethoxazole   Review of Systems Review of Systems  Constitutional: Negative for appetite change, chills, diaphoresis, fatigue and fever.  HENT: Negative for mouth sores, sore  throat and trouble swallowing.   Eyes: Negative for visual disturbance.  Respiratory: Negative for cough, chest tightness, shortness of breath and wheezing.   Cardiovascular: Negative for chest pain.  Gastrointestinal: Negative for abdominal distention, abdominal pain, diarrhea, nausea and vomiting.  Endocrine: Negative for polydipsia, polyphagia and polyuria.  Genitourinary: Negative for dysuria, frequency and hematuria.  Musculoskeletal: Negative for gait problem.  Skin: Negative for color change, pallor and rash.  Neurological: Negative for dizziness, syncope, light-headedness and headaches.  Hematological: Does not bruise/bleed easily.  Psychiatric/Behavioral: Negative for behavioral problems and confusion.     Physical Exam Updated Vital Signs BP 173/81 (BP Location: Left Arm)   Pulse 76   Temp 98  F (36.7 C) (Oral)   Resp 17   SpO2 98%   Physical Exam  Constitutional: She is oriented to person, place, and time. She appears well-developed and well-nourished. No distress.  HENT:  Head: Normocephalic.  Mouth/Throat: Oropharynx is clear and moist.  Eyes: Conjunctivae are normal. Pupils are equal, round, and reactive to light. No scleral icterus.  Neck: Normal range of motion. Neck supple. No thyromegaly present.  Cardiovascular: Normal rate, regular rhythm, normal heart sounds and intact distal pulses.  Exam reveals no gallop and no friction rub.   No murmur heard. Pulmonary/Chest: Effort normal and breath sounds normal. No respiratory distress. She has no wheezes. She has no rales.  Abdominal: Soft. Bowel sounds are normal. She exhibits no distension. There is no tenderness. There is no rebound and no guarding.  Musculoskeletal: Normal range of motion. She exhibits tenderness.  Can flex and extend arm without pain;  No palpable retractions biceps proximally and medially. No ecchymosis. No induration. Pain is difficult to reproduce but does seem to do so with arm extended and  attempted flexion and elbow. No sign of biceps tendon disruption. May be slight biceps tear.  Neurological: She is alert and oriented to person, place, and time.  Skin: Skin is warm and dry. No rash noted.  Psychiatric: She has a normal mood and affect. Her behavior is normal.     ED Treatments / Results  DIAGNOSTIC STUDIES: Oxygen Saturation is 98% on RA, normal by my interpretation.    COORDINATION OF CARE: 4:01 PM Discussed treatment plan with pt at bedside and pt agreed to plan. Pt advised of symptomatic care at home, prepared for discharge and advised to return if her arm becomes stiff or hardens around the affected area.  Labs (all labs ordered are listed, but only abnormal results are displayed) Labs Reviewed - No data to display  EKG  EKG Interpretation None       Radiology Dg Humerus Left  Result Date: 04/15/2016 CLINICAL DATA:  Left upper arm pain EXAM: LEFT HUMERUS - 2+ VIEW COMPARISON:  None. FINDINGS: There is no evidence of fracture or other focal bone lesions. Soft tissues are unremarkable. IMPRESSION: No acute osseous injury of the left humerus. Electronically Signed   By: Kathreen Devoid   On: 04/15/2016 15:21    Procedures Procedures (including critical care time)  Medications Ordered in ED Medications - No data to display   Initial Impression / Assessment and Plan / ED Course  I have reviewed the triage vital signs and the nursing notes.  Pertinent labs & imaging results that were available during my care of the patient were reviewed by me and considered in my medical decision making (see chart for details).     I personally performed the services described in this documentation, which was scribed in my presence. The recorded information has been reviewed and is accurate.   Final Clinical Impressions(s) / ED Diagnoses   Final diagnoses:  Left arm pain  Strain of left biceps, initial encounter   Patient advised use Tylenol. Recheck with any  induration or firmness of the arm. Avoid lifting anything that causes pain. Primary care follow-up. Patient told to expect the possibility of some subcutaneous ecchymosis due to her Eliquis if this is a partial biceps muscular strain.  New Prescriptions New Prescriptions   No medications on file     Tanna Furry, MD 04/15/16 1614

## 2016-04-15 NOTE — ED Triage Notes (Signed)
Pt sts left arm pain after lifting corner of mattress today; pt sts felt like something "popped"

## 2016-04-20 ENCOUNTER — Emergency Department (HOSPITAL_COMMUNITY)
Admission: EM | Admit: 2016-04-20 | Discharge: 2016-04-20 | Disposition: A | Discharge status: Home / Self Care | Payer: Medicare Other | Attending: Emergency Medicine | Admitting: Emergency Medicine

## 2016-04-20 ENCOUNTER — Encounter (HOSPITAL_COMMUNITY): Payer: Self-pay | Admitting: Emergency Medicine

## 2016-04-20 DIAGNOSIS — I10 Essential (primary) hypertension: Secondary | ICD-10-CM | POA: Insufficient documentation

## 2016-04-20 DIAGNOSIS — Z96652 Presence of left artificial knee joint: Secondary | ICD-10-CM | POA: Diagnosis not present

## 2016-04-20 DIAGNOSIS — M66822 Spontaneous rupture of other tendons, left upper arm: Secondary | ICD-10-CM | POA: Diagnosis not present

## 2016-04-20 DIAGNOSIS — S46219A Strain of muscle, fascia and tendon of other parts of biceps, unspecified arm, initial encounter: Secondary | ICD-10-CM

## 2016-04-20 DIAGNOSIS — Y999 Unspecified external cause status: Secondary | ICD-10-CM | POA: Insufficient documentation

## 2016-04-20 DIAGNOSIS — Z85828 Personal history of other malignant neoplasm of skin: Secondary | ICD-10-CM | POA: Insufficient documentation

## 2016-04-20 DIAGNOSIS — Z87891 Personal history of nicotine dependence: Secondary | ICD-10-CM | POA: Diagnosis not present

## 2016-04-20 DIAGNOSIS — Z7901 Long term (current) use of anticoagulants: Secondary | ICD-10-CM | POA: Insufficient documentation

## 2016-04-20 DIAGNOSIS — Y929 Unspecified place or not applicable: Secondary | ICD-10-CM | POA: Insufficient documentation

## 2016-04-20 DIAGNOSIS — X58XXXA Exposure to other specified factors, initial encounter: Secondary | ICD-10-CM | POA: Insufficient documentation

## 2016-04-20 DIAGNOSIS — Y939 Activity, unspecified: Secondary | ICD-10-CM | POA: Insufficient documentation

## 2016-04-20 DIAGNOSIS — S46212A Strain of muscle, fascia and tendon of other parts of biceps, left arm, initial encounter: Secondary | ICD-10-CM | POA: Diagnosis not present

## 2016-04-20 DIAGNOSIS — E039 Hypothyroidism, unspecified: Secondary | ICD-10-CM | POA: Diagnosis not present

## 2016-04-20 DIAGNOSIS — S4992XA Unspecified injury of left shoulder and upper arm, initial encounter: Secondary | ICD-10-CM | POA: Diagnosis present

## 2016-04-20 LAB — CBC
HCT: 32.8 % — ABNORMAL LOW (ref 36.0–46.0)
Hemoglobin: 11 g/dL — ABNORMAL LOW (ref 12.0–15.0)
MCH: 31.2 pg (ref 26.0–34.0)
MCHC: 33.5 g/dL (ref 30.0–36.0)
MCV: 92.9 fL (ref 78.0–100.0)
Platelets: 212 10*3/uL (ref 150–400)
RBC: 3.53 MIL/uL — ABNORMAL LOW (ref 3.87–5.11)
RDW: 13 % (ref 11.5–15.5)
WBC: 5.3 10*3/uL (ref 4.0–10.5)

## 2016-04-20 NOTE — ED Triage Notes (Signed)
Pt. Stated, I have a bruise on my upper left arm and its not a muscle problem , he said come here if it was black and blue.

## 2016-04-20 NOTE — ED Provider Notes (Signed)
[facility] DEPT Provider Note   CSN: [id number] Arrival date & time: 04/20/16  1140     History   Chief Complaint Chief Complaint  Patient presents with  . Arm Pain    HPI Sara Russell is a 83 y.o. female.  Pt presents to the ED today with bruising and swelling to her left upper arm.  She was here on 3/5 for the same.  She was diagnosed with a partial bicep tendon tear.  She is on Eliquis for a.fib (chadvasc score  Of 4).  She was told to be prepared for bruising (she had none on the 5th).  She noticed the bruising today and came back to get that evaluated.  Pt is able to flex and extend arm, but when she's holding something even slightly heavy (ex.  Coffee cup), she is unable to flex arm.      Past Medical History:  Diagnosis Date  . Abdominal mass   . AKI (acute kidney injury) ([place]) 09/15/2014  . Atrial fibrillation ([place])   . Colitis, ulcerative ([place])   . Cough    WHITE SPUTUM FOR 2 WEEKS, NO FEVER  . Degenerative joint disease   . Diverticulosis   . DVT (deep venous thrombosis) ([place]) 2009   BOTH LUNGS  . GERD (gastroesophageal reflux disease)   . Hyperlipidemia   . Hyperplastic colon polyp   . Hypertension   . Hypothyroidism   . Iron deficiency anemia    ON IRON AT TIMES  . PE (pulmonary embolism) 2009  . Persistent atrial fibrillation ([place])   . Positive H. pylori test 01/20/96  . S/P dilatation of esophageal stricture 1992  . Skin cancer    h/o basal and squamous skin cancer--> removed    Patient Active Problem List   Diagnosis Date Noted  . Hypothyroidism 04/04/2015  . Compression fracture of L1 lumbar vertebra (HCC) 02/23/2015  . Choledocholithiasis 02/23/2015  . Colitis presumed infectious 02/17/2015  . Encounter for palliative care   . AKI (acute kidney injury) ([place]) 09/15/2014  . Chronic diastolic congestive heart failure ([place])   . Osteoporosis 05/13/2014  . Gout 08/16/2013  . Hyponatremia 08/10/2013  . Left atrial enlargement 01/10/2013   . A-fib ([place]) 12/08/2012  . Rosacea 05/15/2012  . Anemia due to chronic blood loss 08/28/2010  . DEGENERATIVE DISC DISEASE, LUMBAR SPINE 07/14/2009  . DVT 07/11/2009  . GERD 07/11/2009  . PERSONAL HX COLONIC POLYPS 07/11/2009  . HYPERCHOLESTEROLEMIA 04/10/2006  . HYPERTENSION, BENIGN SYSTEMIC 04/10/2006  . History of pulmonary embolism 04/10/2006  . DIVERTICULOSIS OF COLON 04/10/2006  . ACTINIC KERATOSIS 04/10/2006  . GEN OSTEOARTHROSIS INVOLVING MULTIPLE SITES 04/10/2006    Past Surgical History:  Procedure Laterality Date  . APPENDECTOMY    . BREAST LUMPECTOMY     benign  . DILATION AND CURETTAGE OF UTERUS     for endometrial polyps  . ENDOSCOPIC RETROGRADE CHOLANGIOPANCREATOGRAPHY (ERCP) WITH PROPOFOL N/A 03/23/2015   Procedure: ENDOSCOPIC RETROGRADE CHOLANGIOPANCREATOGRAPHY (ERCP) WITH PROPOFOL;  Surgeon: Milus Banister, MD;  Location: [facility];  Service: Endoscopy;  Laterality: N/A;  . JOINT REPLACEMENT    . ORIF ANKLE FRACTURE Left    X 2  . REPLACEMENT TOTAL KNEE     left  . SURGERY DONE TO REMOVE LEFT ANKLE HARDWARE LATER    . TONSILLECTOMY      OB History    No data available       Home Medications    Prior to Admission medications   Medication  Sig Start Date End Date Taking? Authorizing Provider  acetaminophen (TYLENOL ARTHRITIS PAIN) 650 MG CR tablet Take 650 mg by mouth daily.    Historical Provider, MD  amiodarone (PACERONE) 200 MG tablet TAKE ONE TABLET BY MOUTH ONCE DAILY 10/25/15   Zenia Resides, MD  amLODipine (NORVASC) 5 MG tablet Take 1 tablet (5 mg total) by mouth daily. 02/14/16   Zenia Resides, MD  apixaban (ELIQUIS) 2.5 MG TABS tablet Take 1 tablet (2.5 mg total) by mouth 2 (two) times daily. 02/14/16   Zenia Resides, MD  atorvastatin (LIPITOR) 10 MG tablet TAKE ONE TABLET BY MOUTH ONCE DAILY AT  [facility] 05/01/15   Zenia Resides, MD  carvedilol (COREG) 6.25 MG tablet Take 1 tablet (6.25 mg total) by mouth 2 (two) times daily with a meal.  05/01/15   Zenia Resides, MD  diphenhydramine-acetaminophen (TYLENOL PM) 25-500 MG TABS tablet Take 1 tablet by mouth at bedtime as needed.    Historical Provider, MD  furosemide (LASIX) 20 MG tablet Take 20 mg by mouth as needed.    Historical Provider, MD  levothyroxine (SYNTHROID, LEVOTHROID) 50 MCG tablet Take 1 tablet (50 mcg total) by mouth daily. 02/14/16   Zenia Resides, MD  methylcellulose oral powder Take by mouth daily.    Historical Provider, MD  omeprazole (PRILOSEC) 40 MG capsule Take 1 capsule (40 mg total) by mouth daily. 02/14/16   Zenia Resides, MD  saccharomyces boulardii (FLORASTOR) 250 MG capsule Take 1 capsule (250 mg total) by mouth 2 (two) times daily. Patient not taking: Reported on 11/28/2015 10/06/14   Bary Leriche, PA-C    Family History Family History  Problem Relation Age of Onset  . Esophageal cancer Brother   . Diverticulitis Brother   . Heart disease Mother   . Stroke Mother   . Heart disease Father   . Atrial fibrillation Father   . Colon cancer Neg Hx     Social History Social History  Substance Use Topics  . Smoking status: Former Smoker    Packs/day: 0.50    Years: 13.00    Types: Cigarettes    Start date: 02/12/1948    Quit date: 02/11/1961  . Smokeless tobacco: Never Used     Comment: Denies use of tobacco since 1963  . Alcohol use 3.0 oz/week    5 Glasses of wine per week     Comment: WINE FEW TIMES PER WEEK     Allergies   Iohexol; Ivp dye [iodinated diagnostic agents]; Scallops [shellfish allergy]; Sulfa antibiotics; and Sulfamethoxazole   Review of Systems Review of Systems  Musculoskeletal:       Left arm pain/bruising  All other systems reviewed and are negative.    Physical Exam Updated Vital Signs BP 159/77 (BP Location: Right Arm)   Pulse 64   Temp 98.3 F (36.8 C) (Oral)   Resp 17   Ht 5' (1.524 m)   SpO2 97%   Physical Exam  Constitutional: She is oriented to person, place, and time. She appears  well-developed and well-nourished.  HENT:  Head: Normocephalic and atraumatic.  Right Ear: External ear normal.  Left Ear: External ear normal.  Nose: Nose normal.  Mouth/Throat: Oropharynx is clear and moist.  Eyes: Conjunctivae and EOM are normal. Pupils are equal, round, and reactive to light.  Neck: Normal range of motion. Neck supple.  Cardiovascular: Normal rate, regular rhythm, normal heart sounds and intact distal pulses.   Pulmonary/Chest: Effort normal  and breath sounds normal.  Abdominal: Soft. Bowel sounds are normal.  Musculoskeletal: Normal range of motion.  Neurological: She is alert and oriented to person, place, and time.  Skin: Skin is warm.  Psychiatric: She has a normal mood and affect. Her behavior is normal. Judgment and thought content normal.  Nursing note and vitals reviewed.    ED Treatments / Results  Labs (all labs ordered are listed, but only abnormal results are displayed) Labs Reviewed  CBC - Abnormal; Notable for the following:       Result Value   RBC 3.53 (*)    Hemoglobin 11.0 (*)    HCT 32.8 (*)    All other components within normal limits    EKG  EKG Interpretation None       Radiology No results found.  Procedures Procedures (including critical care time)  Medications Ordered in ED Medications - No data to display   Initial Impression / Assessment and Plan / ED Course  I have reviewed the triage vital signs and the nursing notes.  Pertinent labs & imaging results that were available during my care of the patient were reviewed by me and considered in my medical decision making (see chart for details).    Hgb is stable, so pt will be d/c home.  She is encouraged to f/u with ortho and to return if worse.  Final Clinical Impressions(s) / ED Diagnoses   Final diagnoses:  Biceps tendon tear    New Prescriptions Discharge Medication List as of 04/20/2016  1:32 PM       Isla Pence, MD 04/20/16 1453

## 2016-04-20 NOTE — ED Triage Notes (Signed)
Pt. Stated, I need to come back the doctor said because Im on Eliquise

## 2016-04-22 ENCOUNTER — Ambulatory Visit (INDEPENDENT_AMBULATORY_CARE_PROVIDER_SITE_OTHER): Payer: Self-pay | Admitting: Orthopedic Surgery

## 2016-04-24 ENCOUNTER — Encounter (INDEPENDENT_AMBULATORY_CARE_PROVIDER_SITE_OTHER): Payer: Self-pay | Admitting: Family

## 2016-04-24 ENCOUNTER — Ambulatory Visit (INDEPENDENT_AMBULATORY_CARE_PROVIDER_SITE_OTHER): Payer: Medicare Other | Admitting: Family

## 2016-04-24 VITALS — Ht 61.0 in | Wt 141.0 lb

## 2016-04-24 DIAGNOSIS — M25512 Pain in left shoulder: Secondary | ICD-10-CM

## 2016-04-24 NOTE — Progress Notes (Signed)
Office Visit Note   Patient: Sara Russell           Date of Birth: 1931-01-16           MRN: [id number] Visit Date: 04/24/2016              Requested by: Zenia Resides, MD [address] [place], [place] [address] PCP: Zigmund Gottron, MD  Chief Complaint  Patient presents with  . Left Shoulder - Pain    Last week making bed and felt pop in arm    HPI: Pt went to the ER x 2 last week. She was making a bed and felt her arm pop. She is questioning a possible biceps tendon tear as she felt a pop at the biceps and now is swollen and bruised. She is also on eliquis. Pamella Pert, RMA  Patient is an 83 year old woman who is seen today for evaluation of left upper arm pain. She was making her bed lifting the mattress and felt something pop in her arm. Has pain when using the arm. No pain at rest. Does take Eliquis and has had significant bruising. Denies weakness no numbness  Did have 2 visits to the ER once for the initial injury and once in follow-up for the bruising for concern of bleeding since she takes Eliquis. Outside notes and radiographs reviewed.    Assessment & Plan: Visit Diagnoses:  1. Acute pain of left shoulder     Plan: Reassurance provided. Possible partial biceps tendon tear v muscle strain. Ice. Ibu or Aleve for pain. Gentle range of motion.   Follow-Up Instructions: Return in about 4 weeks (around 05/22/2016), or if symptoms worsen or fail to improve.   Physical Exam  Constitutional: Appears well-developed.  Head: Normocephalic.  Eyes: EOM are normal.  Neck: Normal range of motion.  Cardiovascular: Normal rate.   Pulmonary/Chest: Effort normal.  Neurological: Is alert.  Skin: Skin is warm.  Psychiatric: Has a normal mood and affect.  Left Shoulder Exam   Tenderness  The patient is experiencing tenderness in the biceps tendon.  Range of Motion  The patient has normal left shoulder ROM.  Muscle Strength  The patient has normal  left shoulder strength.  Tests  Drop Arm: negative Impingement: negative    Full range of motion.  Imaging: No results found.  Labs: Lab Results  Component Value Date   HGBA1C 5.3 08/02/2013   ESRSEDRATE 7 04/11/2008   LABURIC 6.5 09/10/2013   LABURIC 8.8 (H) 08/25/2013   LABURIC 5.3 08/16/2013   REPTSTATUS 09/23/2014 FINAL 09/18/2014   CULT NO GROWTH 5 DAYS 09/18/2014   LABORGA No Salmonella,Shigella,Campylobacter or Yersinia 04/30/2010   LABORGA isolated. 04/30/2010    Orders:  No orders of the defined types were placed in this encounter.  No orders of the defined types were placed in this encounter.    Procedures: No procedures performed  Clinical Data: No additional findings.  Subjective: Review of Systems  Constitutional: Negative for chills and fever.  Musculoskeletal: Positive for myalgias.  Neurological: Negative for weakness and numbness.    Objective: Vital Signs: Ht 5' 1"  (1.549 m)   Wt 141 lb (64 kg)   BMI 26.64 kg/m   Specialty Comments:  No specialty comments available.  PMFS History: Patient Active Problem List   Diagnosis Date Noted  . Hypothyroidism 04/04/2015  . Compression fracture of L1 lumbar vertebra (HCC) 02/23/2015  . Choledocholithiasis 02/23/2015  . Colitis presumed infectious 02/17/2015  . Encounter  for palliative care   . AKI (acute kidney injury) ([place]) 09/15/2014  . Chronic diastolic congestive heart failure ([place])   . Osteoporosis 05/13/2014  . Gout 08/16/2013  . Hyponatremia 08/10/2013  . Left atrial enlargement 01/10/2013  . A-fib ([place]) 12/08/2012  . Rosacea 05/15/2012  . Anemia due to chronic blood loss 08/28/2010  . DEGENERATIVE DISC DISEASE, LUMBAR SPINE 07/14/2009  . DVT 07/11/2009  . GERD 07/11/2009  . PERSONAL HX COLONIC POLYPS 07/11/2009  . HYPERCHOLESTEROLEMIA 04/10/2006  . HYPERTENSION, BENIGN SYSTEMIC 04/10/2006  . History of pulmonary embolism 04/10/2006  . DIVERTICULOSIS OF COLON 04/10/2006  .  ACTINIC KERATOSIS 04/10/2006  . GEN OSTEOARTHROSIS INVOLVING MULTIPLE SITES 04/10/2006   Past Medical History:  Diagnosis Date  . Abdominal mass   . AKI (acute kidney injury) ([place]) 09/15/2014  . Atrial fibrillation ([place])   . Colitis, ulcerative ([place])   . Cough    WHITE SPUTUM FOR 2 WEEKS, NO FEVER  . Degenerative joint disease   . Diverticulosis   . DVT (deep venous thrombosis) ([place]) 2009   BOTH LUNGS  . GERD (gastroesophageal reflux disease)   . Hyperlipidemia   . Hyperplastic colon polyp   . Hypertension   . Hypothyroidism   . Iron deficiency anemia    ON IRON AT TIMES  . PE (pulmonary embolism) 2009  . Persistent atrial fibrillation ([place])   . Positive H. pylori test 01/20/96  . S/P dilatation of esophageal stricture 1992  . Skin cancer    h/o basal and squamous skin cancer--> removed    Family History  Problem Relation Age of Onset  . Esophageal cancer Brother   . Diverticulitis Brother   . Heart disease Mother   . Stroke Mother   . Heart disease Father   . Atrial fibrillation Father   . Colon cancer Neg Hx     Past Surgical History:  Procedure Laterality Date  . APPENDECTOMY    . BREAST LUMPECTOMY     benign  . DILATION AND CURETTAGE OF UTERUS     for endometrial polyps  . ENDOSCOPIC RETROGRADE CHOLANGIOPANCREATOGRAPHY (ERCP) WITH PROPOFOL N/A 03/23/2015   Procedure: ENDOSCOPIC RETROGRADE CHOLANGIOPANCREATOGRAPHY (ERCP) WITH PROPOFOL;  Surgeon: Milus Banister, MD;  Location: [facility];  Service: Endoscopy;  Laterality: N/A;  . JOINT REPLACEMENT    . ORIF ANKLE FRACTURE Left    X 2  . REPLACEMENT TOTAL KNEE     left  . SURGERY DONE TO REMOVE LEFT ANKLE HARDWARE LATER    . TONSILLECTOMY     Social History   Occupational History  . retired Marine scientist Retired   Social History Main Topics  . Smoking status: Former Smoker    Packs/day: 0.50    Years: 13.00    Types: Cigarettes    Start date: 02/12/1948    Quit date: 02/11/1961  . Smokeless tobacco: Never  Used     Comment: Denies use of tobacco since 1963  . Alcohol use 3.0 oz/week    5 Glasses of wine per week     Comment: WINE FEW TIMES PER WEEK  . Drug use: No  . Sexual activity: No

## 2016-04-25 ENCOUNTER — Other Ambulatory Visit: Payer: Self-pay | Admitting: Family Medicine

## 2016-04-25 DIAGNOSIS — Z1231 Encounter for screening mammogram for malignant neoplasm of breast: Secondary | ICD-10-CM

## 2016-05-02 ENCOUNTER — Other Ambulatory Visit: Payer: Self-pay | Admitting: Family Medicine

## 2016-05-21 ENCOUNTER — Encounter (HOSPITAL_COMMUNITY): Payer: Self-pay | Admitting: Nurse Practitioner

## 2016-05-21 ENCOUNTER — Ambulatory Visit (HOSPITAL_COMMUNITY)
Admission: RE | Admit: 2016-05-21 | Discharge: 2016-05-21 | Disposition: A | Discharge status: Home / Self Care | Payer: Medicare Other | Source: Ambulatory Visit | Attending: Nurse Practitioner | Admitting: Nurse Practitioner

## 2016-05-21 VITALS — BP 152/66 | HR 66 | Ht 61.0 in | Wt 152.0 lb

## 2016-05-21 DIAGNOSIS — K219 Gastro-esophageal reflux disease without esophagitis: Secondary | ICD-10-CM | POA: Diagnosis not present

## 2016-05-21 DIAGNOSIS — I481 Persistent atrial fibrillation: Secondary | ICD-10-CM | POA: Diagnosis not present

## 2016-05-21 DIAGNOSIS — Z86711 Personal history of pulmonary embolism: Secondary | ICD-10-CM | POA: Diagnosis not present

## 2016-05-21 DIAGNOSIS — E785 Hyperlipidemia, unspecified: Secondary | ICD-10-CM | POA: Insufficient documentation

## 2016-05-21 DIAGNOSIS — D509 Iron deficiency anemia, unspecified: Secondary | ICD-10-CM | POA: Insufficient documentation

## 2016-05-21 DIAGNOSIS — I517 Cardiomegaly: Secondary | ICD-10-CM | POA: Diagnosis not present

## 2016-05-21 DIAGNOSIS — Z85828 Personal history of other malignant neoplasm of skin: Secondary | ICD-10-CM | POA: Insufficient documentation

## 2016-05-21 DIAGNOSIS — I252 Old myocardial infarction: Secondary | ICD-10-CM | POA: Diagnosis not present

## 2016-05-21 DIAGNOSIS — Z87891 Personal history of nicotine dependence: Secondary | ICD-10-CM | POA: Diagnosis not present

## 2016-05-21 DIAGNOSIS — Z7901 Long term (current) use of anticoagulants: Secondary | ICD-10-CM | POA: Insufficient documentation

## 2016-05-21 DIAGNOSIS — I4819 Other persistent atrial fibrillation: Secondary | ICD-10-CM

## 2016-05-21 DIAGNOSIS — N189 Chronic kidney disease, unspecified: Secondary | ICD-10-CM | POA: Insufficient documentation

## 2016-05-21 DIAGNOSIS — E039 Hypothyroidism, unspecified: Secondary | ICD-10-CM | POA: Diagnosis not present

## 2016-05-21 DIAGNOSIS — I129 Hypertensive chronic kidney disease with stage 1 through stage 4 chronic kidney disease, or unspecified chronic kidney disease: Secondary | ICD-10-CM | POA: Insufficient documentation

## 2016-05-21 LAB — COMPREHENSIVE METABOLIC PANEL
ALT: 31 U/L (ref 14–54)
AST: 38 U/L (ref 15–41)
Albumin: 3.8 g/dL (ref 3.5–5.0)
Alkaline Phosphatase: 75 U/L (ref 38–126)
Anion gap: 8 (ref 5–15)
BUN: 13 mg/dL (ref 6–20)
CO2: 26 mmol/L (ref 22–32)
Calcium: 8.8 mg/dL — ABNORMAL LOW (ref 8.9–10.3)
Chloride: 101 mmol/L (ref 101–111)
Creatinine, Ser: 1.39 mg/dL — ABNORMAL HIGH (ref 0.44–1.00)
GFR calc Af Amer: 39 mL/min — ABNORMAL LOW (ref >60)
GFR calc non Af Amer: 34 mL/min — ABNORMAL LOW (ref >60)
Glucose, Bld: 122 mg/dL — ABNORMAL HIGH (ref 65–99)
Potassium: 3.8 mmol/L (ref 3.5–5.1)
Sodium: 135 mmol/L (ref 135–145)
Total Bilirubin: 0.5 mg/dL (ref 0.3–1.2)
Total Protein: 6.7 g/dL (ref 6.5–8.1)

## 2016-05-21 LAB — TSH: TSH: 8.137 u[IU]/mL — ABNORMAL HIGH (ref 0.350–4.500)

## 2016-05-22 ENCOUNTER — Ambulatory Visit
Admission: RE | Admit: 2016-05-22 | Discharge: 2016-05-22 | Disposition: A | Discharge status: Home / Self Care | Payer: Medicare Other | Source: Ambulatory Visit | Attending: Family Medicine | Admitting: Family Medicine

## 2016-05-22 DIAGNOSIS — Z1231 Encounter for screening mammogram for malignant neoplasm of breast: Secondary | ICD-10-CM | POA: Diagnosis not present

## 2016-05-22 NOTE — Progress Notes (Addendum)
Patient ID: Sara Russell, female   DOB: 1930/09/03, 83 y.o.   MRN: [id number]     Primary Care Physician: Zigmund Gottron, MD Referring Physician: Dr. Coral [name] is a 83 y.o. female with a h/o afib maintaining SR on amiodarone. She been chronic afib for years prior to amiodarone use.. She feels well today and has no complaints.   Today, she denies symptoms of palpitations, chest pain, shortness of breath, orthopnea, PND, lower extremity edema, dizziness, presyncope, syncope, or neurologic sequela. The patient is tolerating medications without difficulties and is otherwise without complaint today.   Past Medical History:  Diagnosis Date  . Abdominal mass   . AKI (acute kidney injury) ([place]) 09/15/2014  . Atrial fibrillation ([place])   . Colitis, ulcerative ([place])   . Cough    WHITE SPUTUM FOR 2 WEEKS, NO FEVER  . Degenerative joint disease   . Diverticulosis   . DVT (deep venous thrombosis) ([place]) 2009   BOTH LUNGS  . GERD (gastroesophageal reflux disease)   . Hyperlipidemia   . Hyperplastic colon polyp   . Hypertension   . Hypothyroidism   . Iron deficiency anemia    ON IRON AT TIMES  . PE (pulmonary embolism) 2009  . Persistent atrial fibrillation ([place])   . Positive H. pylori test 01/20/96  . S/P dilatation of esophageal stricture 1992  . Skin cancer    h/o basal and squamous skin cancer--> removed   Past Surgical History:  Procedure Laterality Date  . APPENDECTOMY    . BREAST LUMPECTOMY     benign  . DILATION AND CURETTAGE OF UTERUS     for endometrial polyps  . ENDOSCOPIC RETROGRADE CHOLANGIOPANCREATOGRAPHY (ERCP) WITH PROPOFOL N/A 03/23/2015   Procedure: ENDOSCOPIC RETROGRADE CHOLANGIOPANCREATOGRAPHY (ERCP) WITH PROPOFOL;  Surgeon: Milus Banister, MD;  Location: [facility];  Service: Endoscopy;  Laterality: N/A;  . JOINT REPLACEMENT    . ORIF ANKLE FRACTURE Left    X 2  . REPLACEMENT TOTAL KNEE     left  . SURGERY DONE TO REMOVE LEFT ANKLE  HARDWARE LATER    . TONSILLECTOMY      Current Outpatient Prescriptions  Medication Sig Dispense Refill  . acetaminophen (TYLENOL ARTHRITIS PAIN) 650 MG CR tablet Take 650 mg by mouth daily.    Marland Kitchen amiodarone (PACERONE) 200 MG tablet TAKE ONE TABLET BY MOUTH ONCE DAILY 90 tablet 3  . amLODipine (NORVASC) 5 MG tablet Take 1 tablet (5 mg total) by mouth daily. 90 tablet 3  . apixaban (ELIQUIS) 2.5 MG TABS tablet Take 1 tablet (2.5 mg total) by mouth 2 (two) times daily. 180 tablet 3  . atorvastatin (LIPITOR) 10 MG tablet TAKE 1 TABLET BY MOUTH EVERY DAY 90 tablet 3  . carvedilol (COREG) 6.25 MG tablet TAKE 1 TABLET BY MOUTH TWICE DAILY 180 tablet 3  . diphenhydramine-acetaminophen (TYLENOL PM) 25-500 MG TABS tablet Take 1 tablet by mouth at bedtime as needed.    . furosemide (LASIX) 20 MG tablet Take 20 mg by mouth as needed.    Marland Kitchen levothyroxine (SYNTHROID, LEVOTHROID) 50 MCG tablet Take 1 tablet (50 mcg total) by mouth daily. 90 tablet 3  . methylcellulose oral powder Take by mouth daily.    Marland Kitchen omeprazole (PRILOSEC) 40 MG capsule Take 1 capsule (40 mg total) by mouth daily. 90 capsule 3  . saccharomyces boulardii (FLORASTOR) 250 MG capsule Take 1 capsule (250 mg total) by mouth 2 (two) times daily.     No  current facility-administered medications for this encounter.     Allergies  Allergen Reactions  . Iohexol Other (See Comments)    unknown  . Ivp Dye [Iodinated Diagnostic Agents] Hives    Was able to tolerate Contrast Media for CT scan.  [name] [Shellfish Allergy] Other (See Comments)    Projectile vomiting.  . Sulfa Antibiotics Hives  . Sulfamethoxazole Hives    REACTION: unspecified    Social History   Social History  . Marital status: Widowed    Spouse name: N/A  . Number of children: 3  . Years of education: college   Occupational History  . retired Marine scientist Retired   Social History Main Topics  . Smoking status: Former Smoker    Packs/day: 0.50    Years: 13.00     Types: Cigarettes    Start date: 02/12/1948    Quit date: 02/11/1961  . Smokeless tobacco: Never Used     Comment: Denies use of tobacco since 1963  . Alcohol use 3.0 oz/week    5 Glasses of wine per week     Comment: WINE FEW TIMES PER WEEK  . Drug use: No  . Sexual activity: No   Other Topics Concern  . Not on file   Social History Narrative   Health Care POA:    Emergency Contact: son, Purvi Ruehl (c) [phone] [name])   End of Life Plan:    Who lives with you: self   Any pets: none   Diet: Pt has a varied diet of protein, starch, vegetables.   Exercise: Pt has no regular exercise plan because of pain in back.   Seatbelts: Pt reports wearing seatbelt when in vehicle.   Nancy Fetter Exposure/Protection: Pt reports wearing sun screen.   Hobbies: reading, traveling, eating out.      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 Esophageal cancer Brother   . Diverticulitis Brother   . Heart disease Mother   . Stroke Mother   . Heart disease Father   . Atrial fibrillation Father   . Colon cancer Neg Hx     ROS- All systems are reviewed and negative except as per the HPI above  Physical Exam: Vitals:   05/21/16 1038  BP: (!) 152/66  Pulse: 66  Weight: 152 lb (68.9 kg)  Height: 5' 1"  (1.549 m)    GEN- The patient is well appearing, alert and oriented x 3 today.   Head- normocephalic, atraumatic Eyes-  Sclera clear, conjunctiva pink Ears- hearing intact Oropharynx- clear Neck- supple, no JVP Lymph- no cervical lymphadenopathy Lungs- Clear to ausculation bilaterally, normal work of breathing Heart- Regular rate and rhythm, no murmurs, rubs or gallops, PMI not laterally displaced GI- soft, NT, ND, + BS Extremities- no clubbing, cyanosis, or edema MS- no significant deformity or atrophy Skin- no rash or lesion Psych-  euthymic mood, full affect Neuro- strength and sensation are intact  EKG- NSR with first degree AV block, pr int 226 ms, qrs int 86 ms,d qtc 469 ms Epic records reviewed   Assessment and Plan: 1.afib In SR on amiodarone  Continue 200 mg daily Continue apixaban at 2.5 mg bid tsh/cmet today  2. CKD Per nephrololgist  3. HTN  Stable  Pt asked since she is stable on amiodarone shw would  like to just be followed by PCP q 6 months F/u with Dr. [name] or afib clinic as needed  Addendum 4/17, I sent PCP tsh results and her levothyroxine was increased to 75 mcg a day. I will decrease amiodarone to 100 mg daily to help offset  side effect profile. I will see her back in two months, with this change.  Geroge Baseman Gabryella Murfin, [facility] [address] [place], [place] [address] [phone]

## 2016-05-27 ENCOUNTER — Telehealth: Payer: Self-pay | Admitting: Family Medicine

## 2016-05-27 ENCOUNTER — Other Ambulatory Visit: Payer: Self-pay | Admitting: Family Medicine

## 2016-05-27 MED ORDER — LEVOTHYROXINE SODIUM 75 MCG PO TABS: 75.0000 ug | ORAL_TABLET | Freq: Every day | ORAL | 3 refills | Status: DC

## 2016-05-27 NOTE — Telephone Encounter (Signed)
Pt would like Dr. Andria Frames to call her, regarding recent visit with Cardiologist/medication changes. ep

## 2016-05-27 NOTE — Telephone Encounter (Signed)
Patient called and levothyroxine dose increased.

## 2016-05-28 ENCOUNTER — Other Ambulatory Visit (HOSPITAL_COMMUNITY): Payer: Self-pay | Admitting: *Deleted

## 2016-05-28 ENCOUNTER — Telehealth: Payer: Self-pay | Admitting: Family Medicine

## 2016-05-28 MED ORDER — AMIODARONE HCL 200 MG PO TABS: 100.0000 mg | ORAL_TABLET | Freq: Every day | ORAL | 3 refills | Status: DC

## 2016-05-28 NOTE — Telephone Encounter (Signed)
Will forward to md. Johnney Ou

## 2016-05-28 NOTE — Telephone Encounter (Signed)
noted 

## 2016-05-28 NOTE — Telephone Encounter (Signed)
Pt called and would like Dr. Andria Frames to know that today Dr. Kayleen Memos reduce her Amiodarone to 100 mg a day and that her Levothyroxine down to 75 mg. She also has to follow up in 2 months with AFIB. jw

## 2016-05-28 NOTE — Addendum Note (Signed)
Encounter addended by: Sherran Needs, NP on: 05/28/2016  9:35 AM<BR>    Actions taken: Sign clinical note

## 2016-05-29 DIAGNOSIS — L853 Xerosis cutis: Secondary | ICD-10-CM | POA: Diagnosis not present

## 2016-05-29 DIAGNOSIS — D485 Neoplasm of uncertain behavior of skin: Secondary | ICD-10-CM | POA: Diagnosis not present

## 2016-05-29 DIAGNOSIS — D2272 Melanocytic nevi of left lower limb, including hip: Secondary | ICD-10-CM | POA: Diagnosis not present

## 2016-05-29 DIAGNOSIS — L720 Epidermal cyst: Secondary | ICD-10-CM | POA: Diagnosis not present

## 2016-05-29 DIAGNOSIS — L821 Other seborrheic keratosis: Secondary | ICD-10-CM | POA: Diagnosis not present

## 2016-05-29 DIAGNOSIS — D2261 Melanocytic nevi of right upper limb, including shoulder: Secondary | ICD-10-CM | POA: Diagnosis not present

## 2016-05-29 DIAGNOSIS — D0439 Carcinoma in situ of skin of other parts of face: Secondary | ICD-10-CM | POA: Diagnosis not present

## 2016-05-29 DIAGNOSIS — L570 Actinic keratosis: Secondary | ICD-10-CM | POA: Diagnosis not present

## 2016-06-04 DIAGNOSIS — D0439 Carcinoma in situ of skin of other parts of face: Secondary | ICD-10-CM | POA: Diagnosis not present

## 2016-06-25 ENCOUNTER — Telehealth: Payer: Self-pay | Admitting: Family Medicine

## 2016-06-25 NOTE — Telephone Encounter (Signed)
Pt wants to talk to dr Andria Frames about her meds.  Please call her after 1pm today

## 2016-06-25 NOTE — Telephone Encounter (Signed)
No acute problem.  Overdue for a visit.  She will make an appointment.

## 2016-06-27 ENCOUNTER — Ambulatory Visit (INDEPENDENT_AMBULATORY_CARE_PROVIDER_SITE_OTHER): Payer: Medicare Other | Admitting: Family Medicine

## 2016-06-27 ENCOUNTER — Encounter: Payer: Self-pay | Admitting: Family Medicine

## 2016-06-27 DIAGNOSIS — Z86711 Personal history of pulmonary embolism: Secondary | ICD-10-CM

## 2016-06-27 DIAGNOSIS — R0609 Other forms of dyspnea: Secondary | ICD-10-CM

## 2016-06-27 DIAGNOSIS — E032 Hypothyroidism due to medicaments and other exogenous substances: Secondary | ICD-10-CM | POA: Diagnosis not present

## 2016-06-27 DIAGNOSIS — I10 Essential (primary) hypertension: Secondary | ICD-10-CM | POA: Diagnosis not present

## 2016-06-27 DIAGNOSIS — I5032 Chronic diastolic (congestive) heart failure: Secondary | ICD-10-CM

## 2016-06-27 DIAGNOSIS — D500 Iron deficiency anemia secondary to blood loss (chronic): Secondary | ICD-10-CM | POA: Diagnosis not present

## 2016-06-27 NOTE — Assessment & Plan Note (Signed)
Labs.  Consider DC amlodipine

## 2016-06-27 NOTE — Assessment & Plan Note (Signed)
Recheck TSH on higher dose.

## 2016-06-27 NOTE — Patient Instructions (Signed)
I am worried why all of a sudden over the last two weeks you are swelling.  I will call tomorrow with the lab results.  I should know more and we can come up with a plan. For now, stay on the lasix every day.

## 2016-06-27 NOTE — Assessment & Plan Note (Signed)
Recheck Hgb.  Anemia might cause high output failure.

## 2016-06-27 NOTE — Assessment & Plan Note (Signed)
Given on eliquis.  Since bilateral, will not pursue DVT angle.

## 2016-06-27 NOTE — Progress Notes (Signed)
   Subjective:    Patient ID: Sara Russell, female    DOB: Jul 29, 1930, 83 y.o.   MRN: [id number]  HPI  It has been a while.  Sara Russell is her mostly for leg swelling and "lumps on left leg." Two week hx of worsening leg swelling, left>right.  Also rash on legs.  States before, no swelling routinely.  She was taking lasix qod and has changed to daily.  Still sig swelling at night.  No other med changes. Related issues: 1. HBP stable, has been on amlodipine for quite some time.  2. Hypothyroid, likely related to amiodorone.  Synthyroid dose increase 6 weeks ago. 3. CHF She feels her chronic DOE is at baseline.  Denies chest pain. 4. Chronic GI bleed on eliquis.  No noted blood loss.  5. Hx of PE.  No CP.  DOE at baseline.  Taking eliquis.    Review of Systems     Objective:   Physical Exam Wt gain and normal BP noted Neck, no JVD Lungs clear Cardiac RRR without m or g Abd benign Ext 2+ bilateral edema and bilateral skin changes consistent with chronic venous insufficiency.        Assessment & Plan:

## 2016-06-27 NOTE — Assessment & Plan Note (Signed)
Check BNP to see if worsening CHF the cause.

## 2016-06-27 NOTE — Assessment & Plan Note (Addendum)
Is the swelling CHF?  Why over the last 2 weeks.  With BNP slightly elevated, normal renal function and leg swelling, will increase lasix to bid.  Patient informed.

## 2016-06-28 LAB — CBC
Hematocrit: 35.9 % (ref 34.0–46.6)
Hemoglobin: 12.1 g/dL (ref 11.1–15.9)
MCH: 31.8 pg (ref 26.6–33.0)
MCHC: 33.7 g/dL (ref 31.5–35.7)
MCV: 95 fL (ref 79–97)
Platelets: 240 10*3/uL (ref 150–379)
RBC: 3.8 x10E6/uL (ref 3.77–5.28)
RDW: 14.2 % (ref 12.3–15.4)
WBC: 5.5 10*3/uL (ref 3.4–10.8)

## 2016-06-28 LAB — CMP14+EGFR
ALT: 22 IU/L (ref 0–32)
AST: 32 IU/L (ref 0–40)
Albumin/Globulin Ratio: 1.9 (ref 1.2–2.2)
Albumin: 4.7 g/dL (ref 3.5–4.7)
Alkaline Phosphatase: 94 IU/L (ref 39–117)
BUN/Creatinine Ratio: 12 (ref 12–28)
BUN: 14 mg/dL (ref 8–27)
Bilirubin Total: 0.4 mg/dL (ref 0.0–1.2)
CO2: 23 mmol/L (ref 18–29)
Calcium: 8.9 mg/dL (ref 8.7–10.3)
Chloride: 91 mmol/L — ABNORMAL LOW (ref 96–106)
Creatinine, Ser: 1.18 mg/dL — ABNORMAL HIGH (ref 0.57–1.00)
GFR calc Af Amer: 49 mL/min/{1.73_m2} — ABNORMAL LOW (ref >59)
GFR calc non Af Amer: 43 mL/min/{1.73_m2} — ABNORMAL LOW (ref >59)
Globulin, Total: 2.5 g/dL (ref 1.5–4.5)
Glucose: 83 mg/dL (ref 65–99)
Potassium: 3.6 mmol/L (ref 3.5–5.2)
Sodium: 133 mmol/L — ABNORMAL LOW (ref 134–144)
Total Protein: 7.2 g/dL (ref 6.0–8.5)

## 2016-06-28 LAB — TSH: TSH: 3.16 u[IU]/mL (ref 0.450–4.500)

## 2016-06-28 LAB — BRAIN NATRIURETIC PEPTIDE: BNP: 137.8 pg/mL — ABNORMAL HIGH (ref 0.0–100.0)

## 2016-07-01 MED ORDER — FUROSEMIDE 20 MG PO TABS: 20.0000 mg | ORAL_TABLET | Freq: Two times a day (BID) | ORAL | 3 refills | Status: DC

## 2016-07-01 NOTE — Addendum Note (Signed)
Addended by: Zenia Resides on: 07/01/2016 11:57 AM   Modules accepted: Orders

## 2016-07-11 ENCOUNTER — Telehealth: Payer: Self-pay | Admitting: Family Medicine

## 2016-07-11 DIAGNOSIS — I5032 Chronic diastolic (congestive) heart failure: Secondary | ICD-10-CM

## 2016-07-11 MED ORDER — CARVEDILOL 12.5 MG PO TABS: 12.5000 mg | ORAL_TABLET | Freq: Two times a day (BID) | ORAL | 3 refills | Status: DC

## 2016-07-11 MED ORDER — FUROSEMIDE 20 MG PO TABS: 20.0000 mg | ORAL_TABLET | Freq: Every day | ORAL | 3 refills | Status: DC

## 2016-07-11 NOTE — Telephone Encounter (Signed)
Pt states she emailed PCP her BP readings and wanted to make sure he got them. Pt would like PCP to call her. ep

## 2016-07-11 NOTE — Telephone Encounter (Signed)
Called patient.  Swelling improved on BID lasix. Will make the folllowing changes. 1. DC amlodipine.  May be contributing to swelling 2. Increase carvedilol to 12.5 bid 3. Decrease lasix to daily. Continue home BPs.  Call me with readings in ~2 weeks.

## 2016-07-23 ENCOUNTER — Ambulatory Visit (HOSPITAL_COMMUNITY): Payer: Medicare Other | Admitting: Nurse Practitioner

## 2016-07-23 DIAGNOSIS — N183 Chronic kidney disease, stage 3 (moderate): Secondary | ICD-10-CM | POA: Diagnosis not present

## 2016-07-23 DIAGNOSIS — I10 Essential (primary) hypertension: Secondary | ICD-10-CM | POA: Diagnosis not present

## 2016-07-23 DIAGNOSIS — Z6837 Body mass index (BMI) 37.0-37.9, adult: Secondary | ICD-10-CM | POA: Diagnosis not present

## 2016-07-25 ENCOUNTER — Ambulatory Visit (HOSPITAL_COMMUNITY)
Admission: RE | Admit: 2016-07-25 | Discharge: 2016-07-25 | Disposition: A | Discharge status: Home / Self Care | Payer: Medicare Other | Source: Ambulatory Visit | Attending: Nurse Practitioner | Admitting: Nurse Practitioner

## 2016-07-25 ENCOUNTER — Encounter (HOSPITAL_COMMUNITY): Payer: Self-pay | Admitting: Nurse Practitioner

## 2016-07-25 ENCOUNTER — Encounter: Payer: Self-pay | Admitting: Family Medicine

## 2016-07-25 ENCOUNTER — Telehealth: Payer: Self-pay | Admitting: *Deleted

## 2016-07-25 VITALS — BP 140/62 | HR 63 | Ht 61.0 in | Wt 156.2 lb

## 2016-07-25 DIAGNOSIS — K219 Gastro-esophageal reflux disease without esophagitis: Secondary | ICD-10-CM | POA: Insufficient documentation

## 2016-07-25 DIAGNOSIS — Z86718 Personal history of other venous thrombosis and embolism: Secondary | ICD-10-CM | POA: Insufficient documentation

## 2016-07-25 DIAGNOSIS — Z823 Family history of stroke: Secondary | ICD-10-CM | POA: Diagnosis not present

## 2016-07-25 DIAGNOSIS — N189 Chronic kidney disease, unspecified: Secondary | ICD-10-CM | POA: Insufficient documentation

## 2016-07-25 DIAGNOSIS — Z96652 Presence of left artificial knee joint: Secondary | ICD-10-CM | POA: Insufficient documentation

## 2016-07-25 DIAGNOSIS — I129 Hypertensive chronic kidney disease with stage 1 through stage 4 chronic kidney disease, or unspecified chronic kidney disease: Secondary | ICD-10-CM | POA: Insufficient documentation

## 2016-07-25 DIAGNOSIS — Z8249 Family history of ischemic heart disease and other diseases of the circulatory system: Secondary | ICD-10-CM | POA: Insufficient documentation

## 2016-07-25 DIAGNOSIS — D509 Iron deficiency anemia, unspecified: Secondary | ICD-10-CM | POA: Diagnosis not present

## 2016-07-25 DIAGNOSIS — N179 Acute kidney failure, unspecified: Secondary | ICD-10-CM | POA: Diagnosis not present

## 2016-07-25 DIAGNOSIS — Z91041 Radiographic dye allergy status: Secondary | ICD-10-CM | POA: Diagnosis not present

## 2016-07-25 DIAGNOSIS — I481 Persistent atrial fibrillation: Secondary | ICD-10-CM | POA: Diagnosis not present

## 2016-07-25 DIAGNOSIS — E039 Hypothyroidism, unspecified: Secondary | ICD-10-CM | POA: Diagnosis not present

## 2016-07-25 DIAGNOSIS — Z85828 Personal history of other malignant neoplasm of skin: Secondary | ICD-10-CM | POA: Diagnosis not present

## 2016-07-25 DIAGNOSIS — Z859 Personal history of malignant neoplasm, unspecified: Secondary | ICD-10-CM | POA: Insufficient documentation

## 2016-07-25 DIAGNOSIS — Z882 Allergy status to sulfonamides status: Secondary | ICD-10-CM | POA: Insufficient documentation

## 2016-07-25 DIAGNOSIS — Z9889 Other specified postprocedural states: Secondary | ICD-10-CM | POA: Insufficient documentation

## 2016-07-25 DIAGNOSIS — Z803 Family history of malignant neoplasm of breast: Secondary | ICD-10-CM | POA: Diagnosis not present

## 2016-07-25 DIAGNOSIS — Z87891 Personal history of nicotine dependence: Secondary | ICD-10-CM | POA: Diagnosis not present

## 2016-07-25 DIAGNOSIS — Z515 Encounter for palliative care: Secondary | ICD-10-CM

## 2016-07-25 DIAGNOSIS — Z91013 Allergy to seafood: Secondary | ICD-10-CM | POA: Diagnosis not present

## 2016-07-25 DIAGNOSIS — E785 Hyperlipidemia, unspecified: Secondary | ICD-10-CM | POA: Diagnosis not present

## 2016-07-25 DIAGNOSIS — Z86010 Personal history of colonic polyps: Secondary | ICD-10-CM | POA: Insufficient documentation

## 2016-07-25 DIAGNOSIS — Z800 Family history of malignant neoplasm of digestive organs: Secondary | ICD-10-CM | POA: Insufficient documentation

## 2016-07-25 DIAGNOSIS — Z888 Allergy status to other drugs, medicaments and biological substances status: Secondary | ICD-10-CM | POA: Diagnosis not present

## 2016-07-25 DIAGNOSIS — Z86711 Personal history of pulmonary embolism: Secondary | ICD-10-CM | POA: Diagnosis not present

## 2016-07-25 DIAGNOSIS — Z7902 Long term (current) use of antithrombotics/antiplatelets: Secondary | ICD-10-CM | POA: Insufficient documentation

## 2016-07-25 DIAGNOSIS — I4819 Other persistent atrial fibrillation: Secondary | ICD-10-CM

## 2016-07-25 NOTE — Progress Notes (Signed)
Patient ID: Sara Russell, female   DOB: 1930-12-02, 83 y.o.   MRN: [id number]     Primary Care Physician: Sara Resides, MD Referring Physician: Dr. Coral Ceo TAWYNA Russell is a 83 y.o. female with a h/o afib maintaining SR on amiodarone. She been chronic afib for years prior to amiodarone use.. She feels well today and has no complaints. On last visit, it was discussed to decrease amio to 100 mg a day since she had done so well to stay in SR and with increased of TSH.  That was two months age and in the clinic today, 6/14, she reports that she has no issues with maintaining  SR on 100 mg amiodarone daily. Her levothyroxine was increased and her thyroid has stabilized.  Today, she denies symptoms of palpitations, chest pain, shortness of breath, orthopnea, PND, lower extremity edema, dizziness, presyncope, syncope, or neurologic sequela. The patient is tolerating medications without difficulties and is otherwise without complaint today.   Past Medical History:  Diagnosis Date  . Abdominal mass   . AKI (acute kidney injury) ([place]) 09/15/2014  . Atrial fibrillation ([place])   . Colitis, ulcerative ([place])   . Cough    WHITE SPUTUM FOR 2 WEEKS, NO FEVER  . Degenerative joint disease   . Diverticulosis   . DVT (deep venous thrombosis) ([place]) 2009   BOTH LUNGS  . GERD (gastroesophageal reflux disease)   . Hyperlipidemia   . Hyperplastic colon polyp   . Hypertension   . Hypothyroidism   . Iron deficiency anemia    ON IRON AT TIMES  . PE (pulmonary embolism) 2009  . Persistent atrial fibrillation ([place])   . Positive H. pylori test 01/20/96  . S/P dilatation of esophageal stricture 1992  . Skin cancer    h/o basal and squamous skin cancer--> removed   Past Surgical History:  Procedure Laterality Date  . APPENDECTOMY    . BREAST EXCISIONAL BIOPSY    . BREAST LUMPECTOMY     benign  . DILATION AND CURETTAGE OF UTERUS     for endometrial polyps  . ENDOSCOPIC RETROGRADE  CHOLANGIOPANCREATOGRAPHY (ERCP) WITH PROPOFOL N/A 03/23/2015   Procedure: ENDOSCOPIC RETROGRADE CHOLANGIOPANCREATOGRAPHY (ERCP) WITH PROPOFOL;  Surgeon: Sara Banister, MD;  Location: [facility];  Service: Endoscopy;  Laterality: N/A;  . JOINT REPLACEMENT    . ORIF ANKLE FRACTURE Left    X 2  . REPLACEMENT TOTAL KNEE     left  . SURGERY DONE TO REMOVE LEFT ANKLE HARDWARE LATER    . TONSILLECTOMY      Current Outpatient Prescriptions  Medication Sig Dispense Refill  . acetaminophen (TYLENOL ARTHRITIS PAIN) 650 MG CR tablet Take 650 mg by mouth daily.    Marland Kitchen amiodarone (PACERONE) 200 MG tablet Take 0.5 tablets (100 mg total) by mouth daily. 90 tablet 3  . apixaban (ELIQUIS) 2.5 MG TABS tablet Take 1 tablet (2.5 mg total) by mouth 2 (two) times daily. 180 tablet 3  . atorvastatin (LIPITOR) 10 MG tablet TAKE 1 TABLET BY MOUTH EVERY DAY 90 tablet 3  . carvedilol (COREG) 12.5 MG tablet Take 1 tablet (12.5 mg total) by mouth 2 (two) times daily with a meal. 60 tablet 3  . diphenhydramine-acetaminophen (TYLENOL PM) 25-500 MG TABS tablet Take 1 tablet by mouth at bedtime as needed.    . furosemide (LASIX) 20 MG tablet Take 1 tablet (20 mg total) by mouth daily. 180 tablet 3  . levothyroxine (SYNTHROID, LEVOTHROID) 75 MCG tablet  Take 1 tablet (75 mcg total) by mouth daily. 90 tablet 3  . methylcellulose oral powder Take by mouth daily.    Marland Kitchen omeprazole (PRILOSEC) 40 MG capsule Take 1 capsule (40 mg total) by mouth daily. 90 capsule 3   No current facility-administered medications for this encounter.     Allergies  Allergen Reactions  . Iohexol Other (See Comments)    unknown  . Ivp Dye [Iodinated Diagnostic Agents] Hives    Was able to tolerate Contrast Media for CT scan.  Sara Russell [Shellfish Allergy] Other (See Comments)    Projectile vomiting.  . Sulfa Antibiotics Hives  . Sulfamethoxazole Hives    REACTION: unspecified    Social History   Social History  . Marital status: Widowed      Spouse name: N/A  . Number of children: 3  . Years of education: college   Occupational History  . retired Marine scientist Retired   Social History Main Topics  . Smoking status: Former Smoker    Packs/day: 0.50    Years: 13.00    Types: Cigarettes    Start date: 02/12/1948    Quit date: 02/11/1961  . Smokeless tobacco: Never Used     Comment: Denies use of tobacco since 1963  . Alcohol use 3.0 oz/week    5 Glasses of wine per week     Comment: WINE FEW TIMES PER WEEK  . Drug use: No  . Sexual activity: No   Other Topics Concern  . Not on file   Social History Narrative   Health Care POA:    Emergency Contact: son, Tommye Russell (c) [phone] [name])   End of Life Plan:    Who lives with you: self   Any pets: none   Diet: Pt has a varied diet of protein, starch, vegetables.   Exercise: Pt has no regular exercise plan because of pain in back.   Seatbelts: Pt reports wearing seatbelt when in vehicle.   Nancy Fetter Exposure/Protection: Pt reports wearing sun screen.   Hobbies: reading, traveling, eating out.      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 Esophageal cancer Brother   . Diverticulitis Brother   . Heart disease Mother   . Stroke Mother   . Heart disease Father   . Atrial fibrillation Father   . Breast cancer Maternal Aunt   . Colon cancer Neg Hx     ROS- All systems are reviewed and negative except as per the HPI above  Physical Exam: Vitals:   07/25/16 0955  BP: 140/62  Pulse: 63  Weight: 156 lb 3.2 oz (70.9 kg)  Height: 5' 1"  (1.549 m)    GEN- The patient is well appearing, alert and oriented x 3 today.   Head- normocephalic, atraumatic Eyes-  Sclera clear, conjunctiva pink Ears- hearing intact Oropharynx- clear Neck- supple, no JVP Lymph- no cervical lymphadenopathy Lungs- Clear to ausculation  bilaterally, normal work of breathing Heart- Regular rate and rhythm, no murmurs, rubs or gallops, PMI not laterally displaced GI- soft, NT, ND, + BS Extremities- no clubbing, cyanosis, or edema MS- no significant deformity or atrophy Skin- no rash or lesion Psych- euthymic mood, full affect Neuro- strength and sensation are intact  EKG- NSR with first degree AV block, pr int 172 ms,  qrs int 86 ms, qtc 476 ms Epic records reviewed   Assessment and Plan: 1.afib Maintaining  SR on amiodarone 100 mg a day Continue apixaban at 2.5 mg bid  2. CKD Per nephrololgist  3. HTN  Stable  Pt asked since she is stable on amiodarone shw would like to just be followed by PCP q 6 months F/u with Dr. [name] or afib clinic as needed   Butch Penny C. Carroll, [facility] [address] [place], [place] [address] [phone]

## 2016-07-25 NOTE — Telephone Encounter (Signed)
Pt has already taken care of this today and Dr. Andria Frames received the readings and the DNR was completed. Katharina Caper, Benjamine Strout D, [place]

## 2016-07-25 NOTE — Assessment & Plan Note (Signed)
Does not want heroic measures.  Does not want to be a burden to her sons.  Has a living will documenting this. 07/25/16 - Feels so strongly that wants even further documentation.  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

## 2016-07-25 NOTE — Telephone Encounter (Signed)
I will review the readings when I get them.

## 2016-07-25 NOTE — Progress Notes (Signed)
Brings in a list of BP and pulse readings.  All BPs are great with only rare high readings and no low readings.  Pulse varies from a low of 58 to a high of 67.  Also wanted to clarify Goals of Care

## 2016-07-25 NOTE — Telephone Encounter (Signed)
Patient will be by to drop off her BP readings from this week.  She would also like to complete and sign a DNR form.  Will forward to team and provider.  Pt will be here around 11am. [place], Sara Russell, [place]

## 2016-08-01 ENCOUNTER — Ambulatory Visit (INDEPENDENT_AMBULATORY_CARE_PROVIDER_SITE_OTHER): Payer: Medicare Other | Admitting: Family Medicine

## 2016-08-01 ENCOUNTER — Encounter: Payer: Self-pay | Admitting: Family Medicine

## 2016-08-01 DIAGNOSIS — Z515 Encounter for palliative care: Secondary | ICD-10-CM

## 2016-08-01 DIAGNOSIS — Z86711 Personal history of pulmonary embolism: Secondary | ICD-10-CM

## 2016-08-01 NOTE — Patient Instructions (Addendum)
I am glad we got this clear and on paper.   Stay on the same meds. See me in the fall for a check and a flu shot.

## 2016-08-01 NOTE — Assessment & Plan Note (Signed)
See overview.

## 2016-08-02 NOTE — Progress Notes (Signed)
   Subjective:    Patient ID: Sara Russell, female    DOB: [birth year], 83 y.o.   MRN: [id number]  HPI Encounter for palliative care/end of life planning.  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  She does not have a terminal illness although she had a near miss one year ago.  That near miss makes her resolute about getting her affairs in order.  She already has a living will.  We discussed both a MOST form and an out of facility DNR form.  She chose the MOST form in that she would want a brief trial of CPR.  Does not want any vent support dialysis or feeding tubes.    Review of Systems     Objective:   Physical Exam  All of visit was counseling.  Duration of visit was 20 minutes.      Assessment & Plan:

## 2016-08-18 IMAGING — DX DG CHEST 2V
2 series · 2 of 2 positions shown · non-contrast
Comparison: 03/20/2014

CLINICAL DATA: Shortness of breath. Pain when breathing for 2 days.

EXAM:
CHEST  2 VIEW

[chest pa]
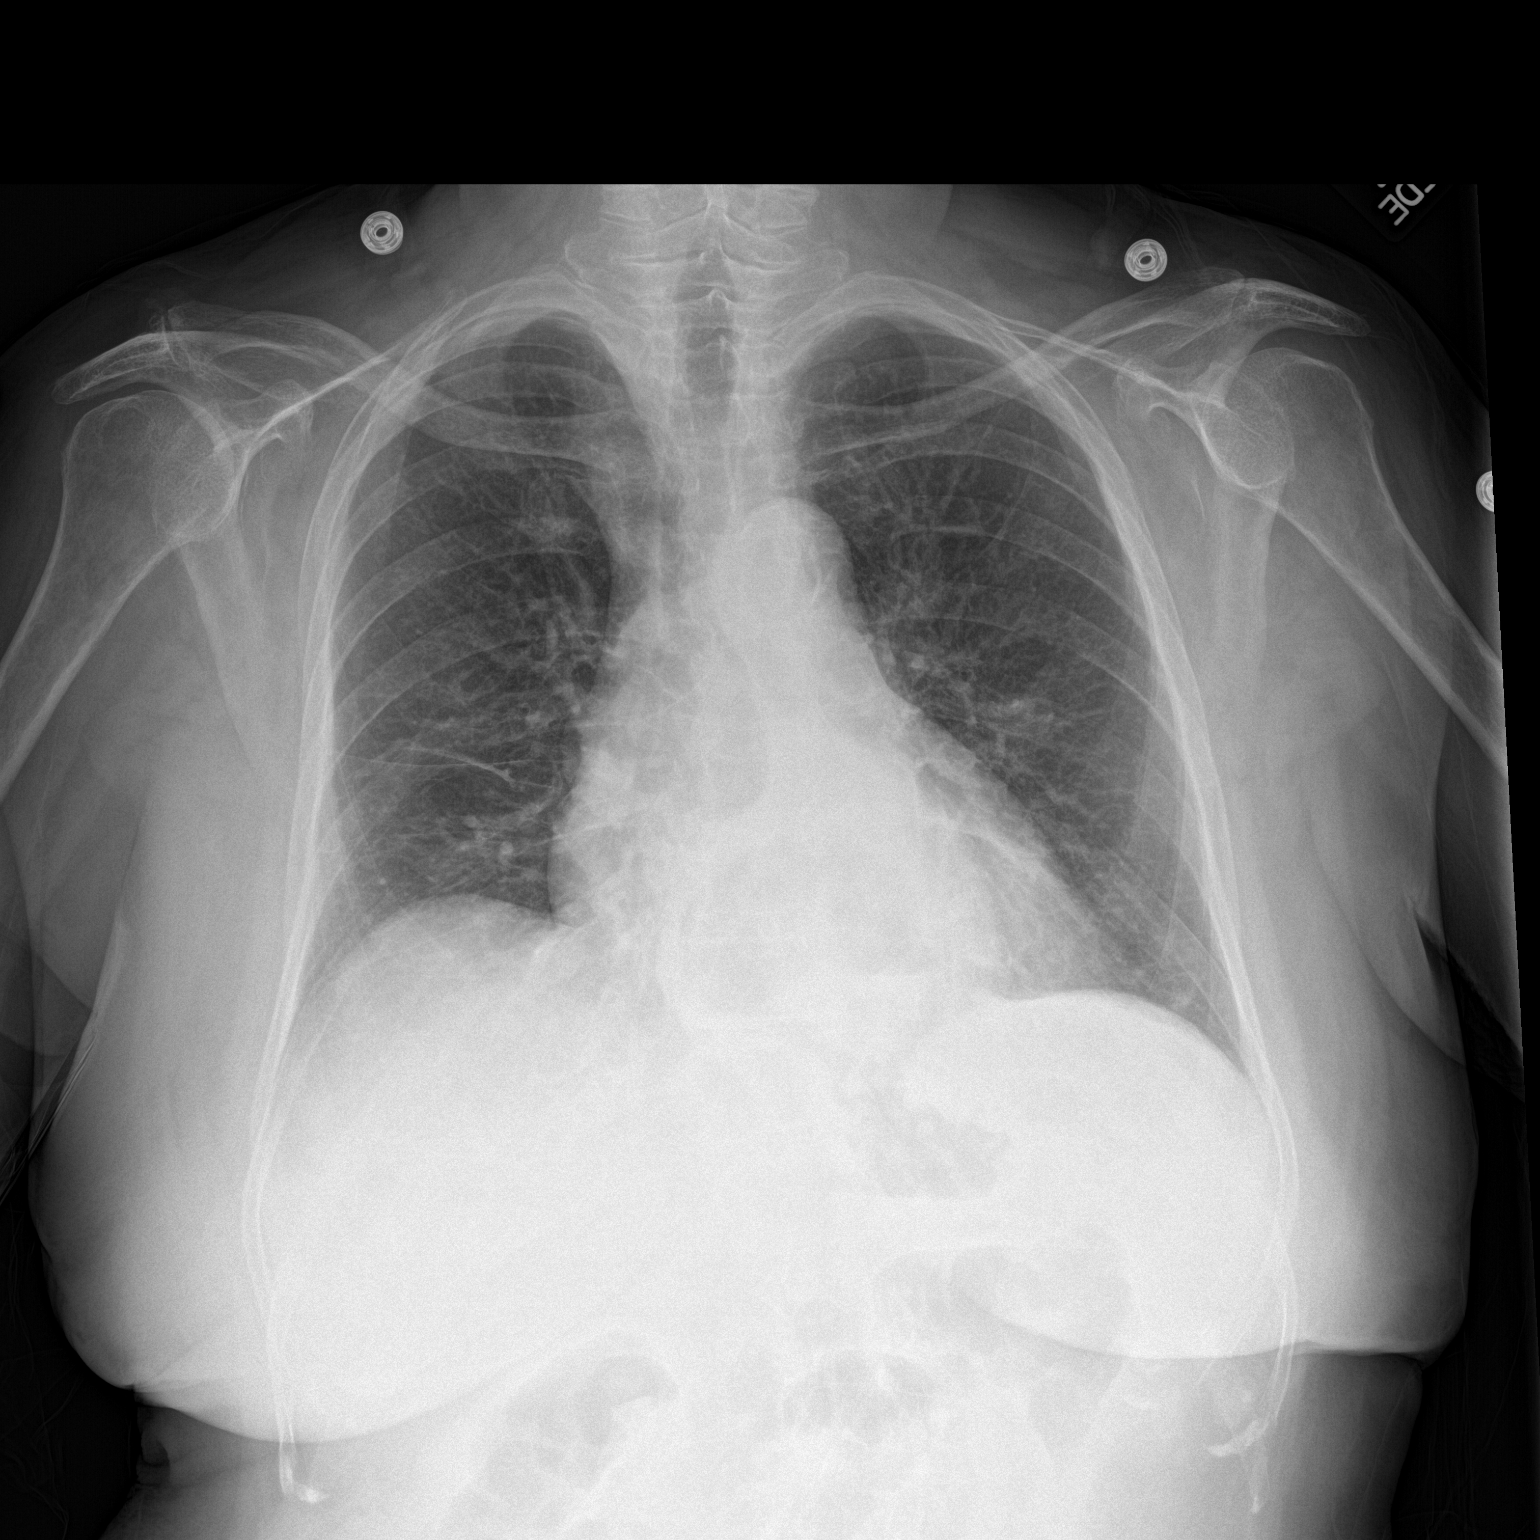

[chest lat]
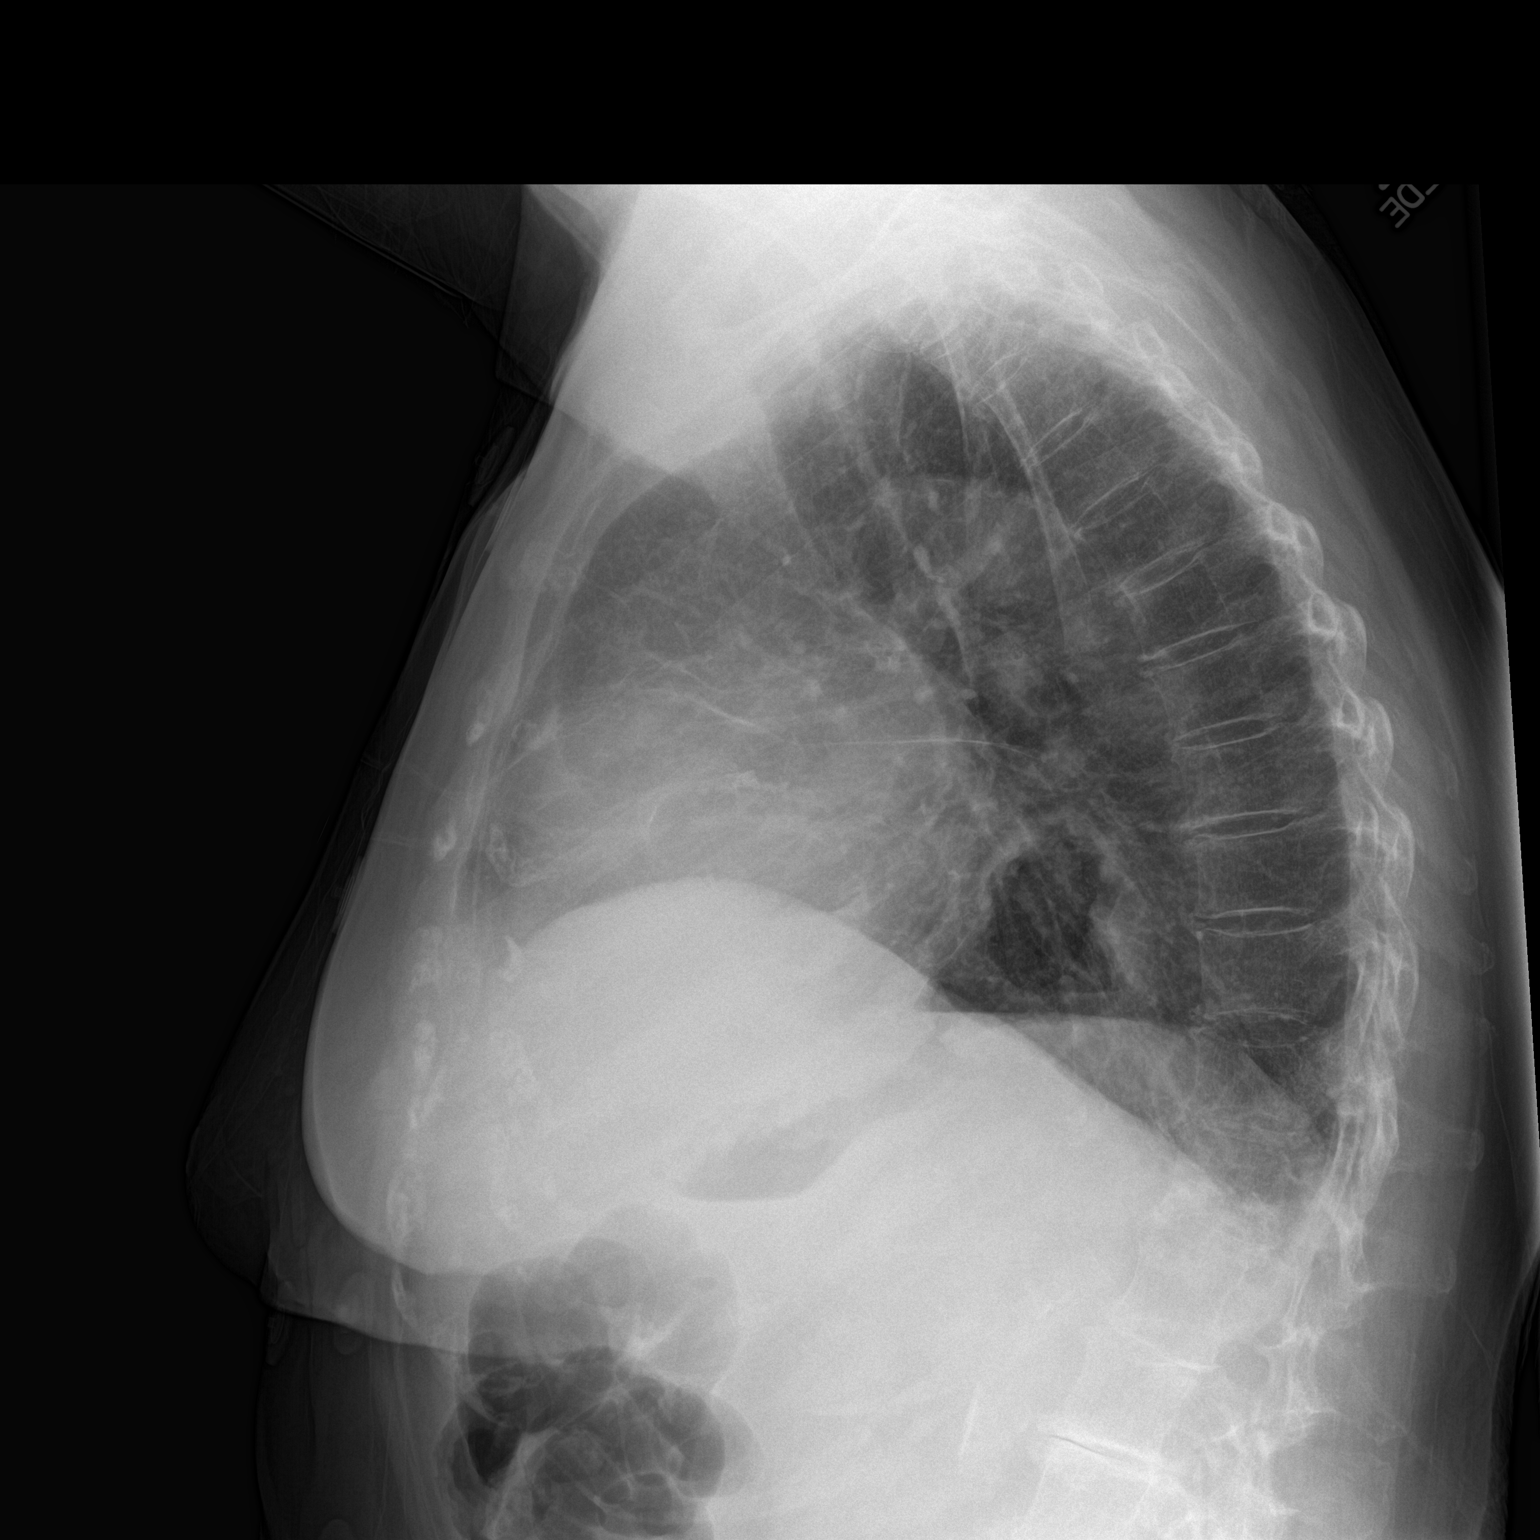

[2 of 2 positions shown; findings below may reference images not displayed]

FINDINGS: Heart size is mildly enlarged. Moderate hiatal hernia present. There
are no focal consolidations. No pleural effusions. No pulmonary
edema. Mild scoliosis.
IMPRESSION: 1. Cardiomegaly without edema.
2. Hiatal hernia.

## 2016-08-19 ENCOUNTER — Telehealth: Payer: Self-pay | Admitting: Family Medicine

## 2016-08-19 IMAGING — CR DG CHEST 1V PORT
1 series · 1 of 1 positions shown · non-contrast
Comparison: September 15, 2014

CLINICAL DATA: Shortness of Breath

EXAM:
PORTABLE CHEST - 1 VIEW

[AP]
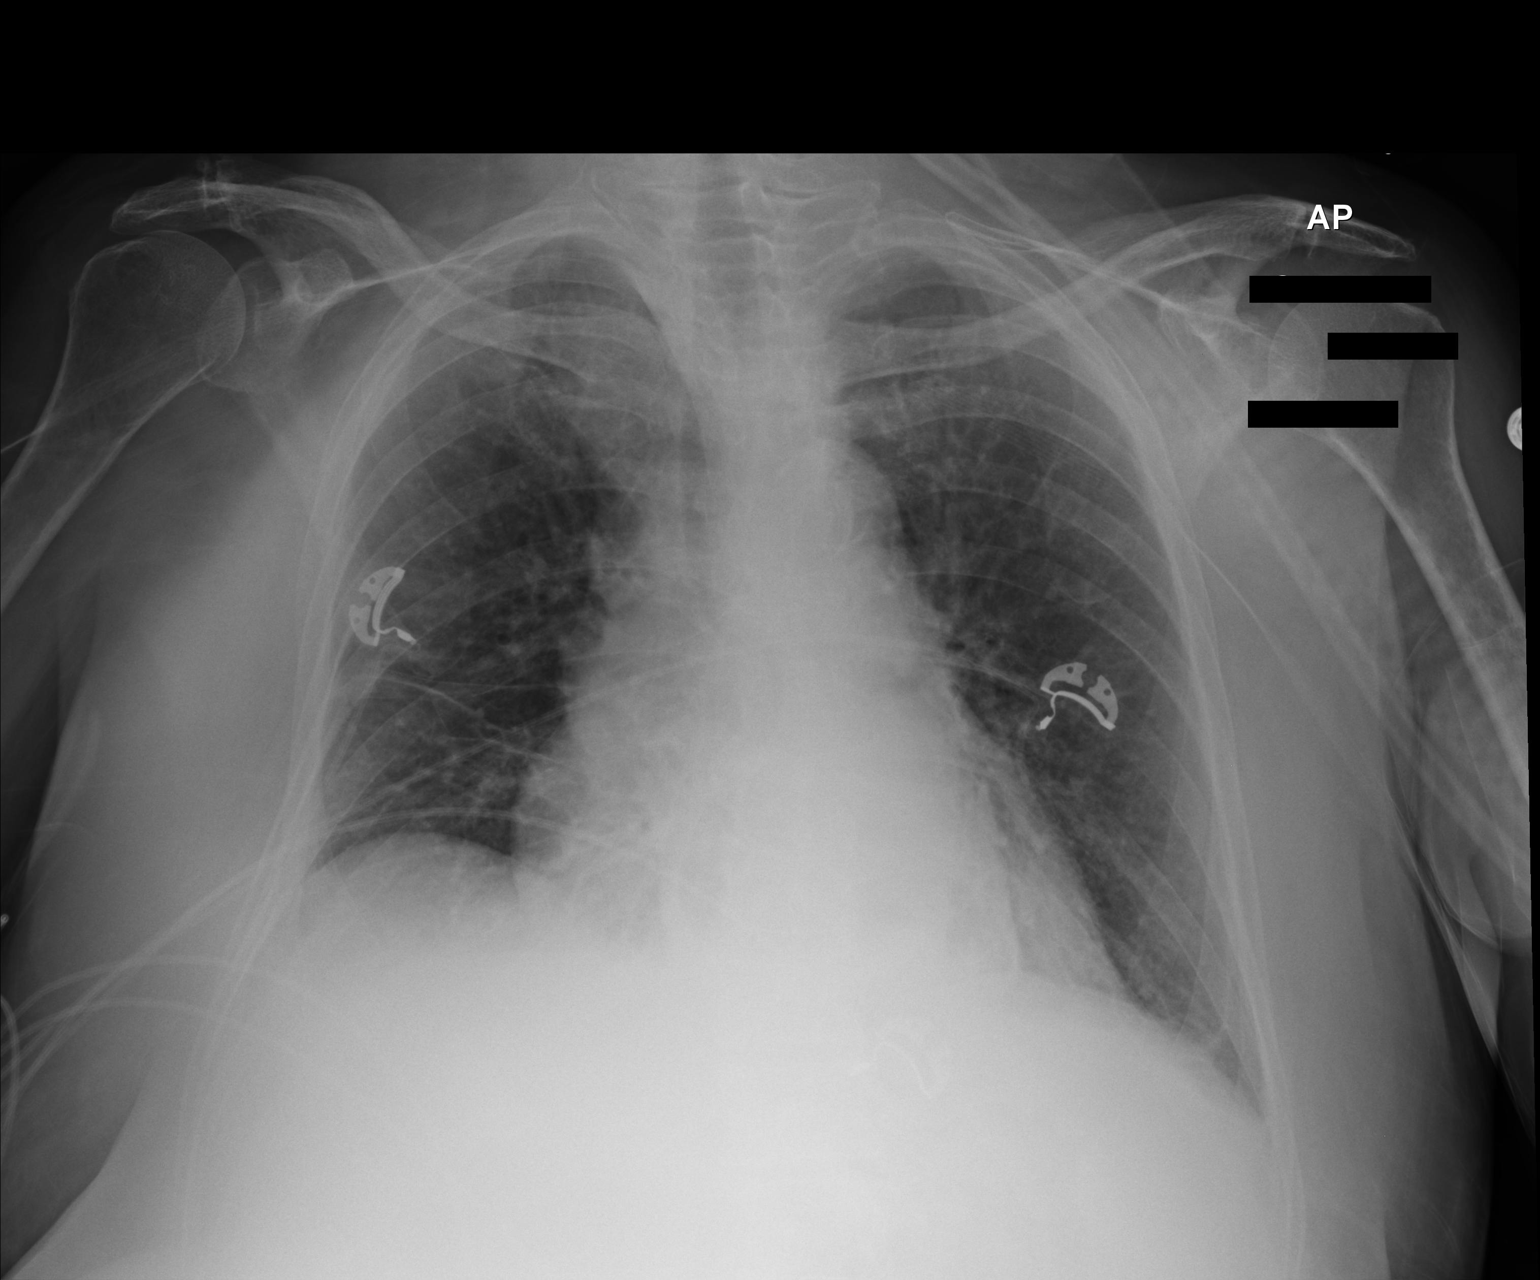

[1 of 1 positions shown; findings below may reference images not displayed]

FINDINGS: There is no edema or consolidation. Heart is upper normal in size
with pulmonary vascularity within normal limits. Moderate hiatal
hernia is present. There is atherosclerotic change in the aorta. No
adenopathy. No bone lesions.
IMPRESSION: No edema or consolidation. Moderate hiatal hernia. Atherosclerotic
calcification in aorta.

## 2016-08-19 MED ORDER — CARVEDILOL 12.5 MG PO TABS: 12.5000 mg | ORAL_TABLET | Freq: Two times a day (BID) | ORAL | 3 refills | Status: DC

## 2016-08-19 MED ORDER — AMIODARONE HCL 100 MG PO TABS: 100.0000 mg | ORAL_TABLET | Freq: Every day | ORAL | 3 refills | Status: DC

## 2016-08-19 NOTE — Telephone Encounter (Signed)
Pt called because she has some questions about her Coreg. Is she suppose to stay on the same dosage now? If so can we call in a 90 day qty to her pharmacy. Also The Amiodarone she is cutting the pills in half. Is she suppose continue with this. Please call to discuss. jw

## 2016-10-03 ENCOUNTER — Telehealth: Payer: Self-pay | Admitting: *Deleted

## 2016-10-03 DIAGNOSIS — I5032 Chronic diastolic (congestive) heart failure: Secondary | ICD-10-CM

## 2016-10-03 MED ORDER — CARVEDILOL 12.5 MG PO TABS: 12.5000 mg | ORAL_TABLET | Freq: Two times a day (BID) | ORAL | 3 refills | Status: DC

## 2016-10-03 MED ORDER — APIXABAN 2.5 MG PO TABS: 2.5000 mg | ORAL_TABLET | Freq: Two times a day (BID) | ORAL | 3 refills | Status: DC

## 2016-10-03 MED ORDER — ATORVASTATIN CALCIUM 10 MG PO TABS: 10.0000 mg | ORAL_TABLET | Freq: Every day | ORAL | 3 refills | Status: DC

## 2016-10-03 MED ORDER — OMEPRAZOLE 40 MG PO CPDR: 40.0000 mg | DELAYED_RELEASE_CAPSULE | Freq: Every day | ORAL | 3 refills | Status: DC

## 2016-10-03 MED ORDER — LEVOTHYROXINE SODIUM 75 MCG PO TABS: 75.0000 ug | ORAL_TABLET | Freq: Every day | ORAL | 3 refills | Status: DC

## 2016-10-03 MED ORDER — AMIODARONE HCL 100 MG PO TABS: 100.0000 mg | ORAL_TABLET | Freq: Every day | ORAL | 3 refills | Status: DC

## 2016-10-03 MED ORDER — FUROSEMIDE 20 MG PO TABS: 20.0000 mg | ORAL_TABLET | ORAL | 3 refills | Status: DC

## 2016-10-03 NOTE — Telephone Encounter (Signed)
Patient left message on nurse line requesting refills on her medications.   Left voice message for patient to call and contact her pharmacy for any refills that she may need.  Derl Barrow, RN

## 2016-10-03 NOTE — Telephone Encounter (Signed)
Patient and I went thru her list and verified meds.  All meds matched up except that she is only taking the Lasix every other day.   She would like for this to be changed and [facility].  She will be switching to this pharmacy. Jozef Eisenbeis, Salome Spotted, CMA

## 2016-10-03 NOTE — Telephone Encounter (Signed)
Change made in lasix instructions and [facility]

## 2016-12-04 ENCOUNTER — Ambulatory Visit (INDEPENDENT_AMBULATORY_CARE_PROVIDER_SITE_OTHER): Payer: Medicare Other | Admitting: *Deleted

## 2016-12-04 DIAGNOSIS — Z23 Encounter for immunization: Secondary | ICD-10-CM | POA: Diagnosis present

## 2016-12-05 DIAGNOSIS — H2513 Age-related nuclear cataract, bilateral: Secondary | ICD-10-CM | POA: Diagnosis not present

## 2016-12-19 ENCOUNTER — Other Ambulatory Visit: Payer: Self-pay

## 2016-12-19 ENCOUNTER — Ambulatory Visit (INDEPENDENT_AMBULATORY_CARE_PROVIDER_SITE_OTHER): Payer: Medicare Other | Admitting: *Deleted

## 2016-12-19 ENCOUNTER — Encounter: Payer: Self-pay | Admitting: *Deleted

## 2016-12-19 VITALS — BP 190/78 | HR 61 | Temp 98.2°F | Ht 61.0 in | Wt 155.0 lb

## 2016-12-19 DIAGNOSIS — Z0000 Encounter for general adult medical examination without abnormal findings: Secondary | ICD-10-CM

## 2016-12-19 NOTE — Patient Instructions (Signed)
Fall Prevention in the Home Falls can cause injuries. They can happen to people of all ages. There are many things you can do to make your home safe and to help prevent falls. What can I do on the outside of my home?  Regularly fix the edges of walkways and driveways and fix any cracks.  Remove anything that might make you trip as you walk through a door, such as a raised step or threshold.  Trim any bushes or trees on the path to your home.  Use bright outdoor lighting.  Clear any walking paths of anything that might make someone trip, such as rocks or tools.  Regularly check to see if handrails are loose or broken. Make sure that both sides of any steps have handrails.  Any raised decks and porches should have guardrails on the edges.  Have any leaves, snow, or ice cleared regularly.  Use sand or salt on walking paths during winter.  Clean up any spills in your garage right away. This includes oil or grease spills. What can I do in the bathroom?  Use night lights.  Install grab bars by the toilet and in the tub and shower. Do not use towel bars as grab bars.  Use non-skid mats or decals in the tub or shower.  If you need to sit down in the shower, use a plastic, non-slip stool.  Keep the floor dry. Clean up any water that spills on the floor as soon as it happens.  Remove soap buildup in the tub or shower regularly.  Attach bath mats securely with double-sided non-slip rug tape.  Do not have throw rugs and other things on the floor that can make you trip. What can I do in the bedroom?  Use night lights.  Make sure that you have a light by your bed that is easy to reach.  Do not use any sheets or blankets that are too big for your bed. They should not hang down onto the floor.  Have a firm chair that has side arms. You can use this for support while you get dressed.  Do not have throw rugs and other things on the floor that can make you trip. What can I do in  the kitchen?  Clean up any spills right away.  Avoid walking on wet floors.  Keep items that you use a lot in easy-to-reach places.  If you need to reach something above you, use a strong step stool that has a grab bar.  Keep electrical cords out of the way.  Do not use floor polish or wax that makes floors slippery. If you must use wax, use non-skid floor wax.  Do not have throw rugs and other things on the floor that can make you trip. What can I do with my stairs?  Do not leave any items on the stairs.  Make sure that there are handrails on both sides of the stairs and use them. Fix handrails that are broken or loose. Make sure that handrails are as long as the stairways.  Check any carpeting to make sure that it is firmly attached to the stairs. Fix any carpet that is loose or worn.  Avoid having throw rugs at the top or bottom of the stairs. If you do have throw rugs, attach them to the floor with carpet tape.  Make sure that you have a light switch at the top of the stairs and the bottom of the stairs. If you  do not have them, ask someone to add them for you. What else can I do to help prevent falls?  Wear shoes that: ? Do not have high heels. ? Have rubber bottoms. ? Are comfortable and fit you well. ? Are closed at the toe. Do not wear sandals.  If you use a stepladder: ? Make sure that it is fully opened. Do not climb a closed stepladder. ? Make sure that both sides of the stepladder are locked into place. ? Ask someone to hold it for you, if possible.  Clearly mark and make sure that you can see: ? Any grab bars or handrails. ? First and last steps. ? Where the edge of each step is.  Use tools that help you move around (mobility aids) if they are needed. These include: ? Canes. ? Walkers. ? Scooters. ? Crutches.  Turn on the lights when you go into a dark area. Replace any light bulbs as soon as they burn out.  Set up your furniture so you have a clear  path. Avoid moving your furniture around.  If any of your floors are uneven, fix them.  If there are any pets around you, be aware of where they are.  Review your medicines with your doctor. Some medicines can make you feel dizzy. This can increase your chance of falling. Ask your doctor what other things that you can do to help prevent falls. This information is not intended to replace advice given to you by your health care provider. Make sure you discuss any questions you have with your health care provider. Document Released: 11/24/2008 Document Revised: 07/06/2015 Document Reviewed: 03/04/2014 Elsevier Interactive Patient Education  2018 [employer]  Health Maintenance, Female Adopting a healthy lifestyle and getting preventive care can go a long way to promote health and wellness. Talk with your health care provider about what schedule of regular examinations is right for you. This is a good chance for you to check in with your provider about disease prevention and staying healthy. In between checkups, there are plenty of things you can do on your own. Experts have done a lot of research about which lifestyle changes and preventive measures are most likely to keep you healthy. Ask your health care provider for more information. Weight and diet Eat a healthy diet  Be sure to include plenty of vegetables, fruits, low-fat dairy products, and lean protein.  Do not eat a lot of foods high in solid fats, added sugars, or salt.  Get regular exercise. This is one of the most important things you can do for your health. ? Most adults should exercise for at least 150 minutes each week. The exercise should increase your heart rate and make you sweat (moderate-intensity exercise). ? Most adults should also do strengthening exercises at least twice a week. This is in addition to the moderate-intensity exercise.  Maintain a healthy weight  Body mass index (BMI) is a measurement that can be  used to identify possible weight problems. It estimates body fat based on height and weight. Your health care provider can help determine your BMI and help you achieve or maintain a healthy weight.  For females 20 years of age and older: ? A BMI below 18.5 is considered underweight. ? A BMI of 18.5 to 24.9 is normal. ? A BMI of 25 to 29.9 is considered overweight. ? A BMI of 30 and above is considered obese.  Watch levels of cholesterol and blood lipids  You should   start having your blood tested for lipids and cholesterol at 81 years of age, then have this test every 5 years.  You may need to have your cholesterol levels checked more often if: ? Your lipid or cholesterol levels are high. ? You are older than 81 years of age. ? You are at high risk for heart disease.  Cancer screening Lung Cancer  Lung cancer screening is recommended for adults 55-80 years old who are at high risk for lung cancer because of a history of smoking.  A yearly low-dose CT scan of the lungs is recommended for people who: ? Currently smoke. ? Have quit within the past 15 years. ? Have at least a 30-pack-year history of smoking. A pack year is smoking an average of one pack of cigarettes a day for 1 year.  Yearly screening should continue until it has been 15 years since you quit.  Yearly screening should stop if you develop a health problem that would prevent you from having lung cancer treatment.  Breast Cancer  Practice breast self-awareness. This means understanding how your breasts normally appear and feel.  It also means doing regular breast self-exams. Let your health care provider know about any changes, no matter how small.  If you are in your 20s or 30s, you should have a clinical breast exam (CBE) by a health care provider every 1-3 years as part of a regular health exam.  If you are 40 or older, have a CBE every year. Also consider having a breast X-ray (mammogram) every year.  If you have  a family history of breast cancer, talk to your health care provider about genetic screening.  If you are at high risk for breast cancer, talk to your health care provider about having an MRI and a mammogram every year.  Breast cancer gene (BRCA) assessment is recommended for women who have family members with BRCA-related cancers. BRCA-related cancers include: ? Breast. ? Ovarian. ? Tubal. ? Peritoneal cancers.  Results of the assessment will determine the need for genetic counseling and BRCA1 and BRCA2 testing.  Cervical Cancer Your health care provider may recommend that you be screened regularly for cancer of the pelvic organs (ovaries, uterus, and vagina). This screening involves a pelvic examination, including checking for microscopic changes to the surface of your cervix (Pap test). You may be encouraged to have this screening done every 3 years, beginning at age 21.  For women ages 30-65, health care providers may recommend pelvic exams and Pap testing every 3 years, or they may recommend the Pap and pelvic exam, combined with testing for human papilloma virus (HPV), every 5 years. Some types of HPV increase your risk of cervical cancer. Testing for HPV may also be done on women of any age with unclear Pap test results.  Other health care providers may not recommend any screening for nonpregnant women who are considered low risk for pelvic cancer and who do not have symptoms. Ask your health care provider if a screening pelvic exam is right for you.  If you have had past treatment for cervical cancer or a condition that could lead to cancer, you need Pap tests and screening for cancer for at least 20 years after your treatment. If Pap tests have been discontinued, your risk factors (such as having a new sexual partner) need to be reassessed to determine if screening should resume. Some women have medical problems that increase the chance of getting cervical cancer. In these cases, your    health care provider may recommend more frequent screening and Pap tests.  Colorectal Cancer  This type of cancer can be detected and often prevented.  Routine colorectal cancer screening usually begins at 81 years of age and continues through 81 years of age.  Your health care provider may recommend screening at an earlier age if you have risk factors for colon cancer.  Your health care provider may also recommend using home test kits to check for hidden blood in the stool.  A small camera at the end of a tube can be used to examine your colon directly (sigmoidoscopy or colonoscopy). This is done to check for the earliest forms of colorectal cancer.  Routine screening usually begins at age 50.  Direct examination of the colon should be repeated every 5-10 years through 81 years of age. However, you may need to be screened more often if early forms of precancerous polyps or small growths are found.  Skin Cancer  Check your skin from head to toe regularly.  Tell your health care provider about any new moles or changes in moles, especially if there is a change in a mole's shape or color.  Also tell your health care provider if you have a mole that is larger than the size of a pencil eraser.  Always use sunscreen. Apply sunscreen liberally and repeatedly throughout the day.  Protect yourself by wearing long sleeves, pants, a wide-brimmed hat, and sunglasses whenever you are outside.  Heart disease, diabetes, and high blood pressure  High blood pressure causes heart disease and increases the risk of stroke. High blood pressure is more likely to develop in: ? People who have blood pressure in the high end of the normal range (130-139/85-89 mm Hg). ? People who are overweight or obese. ? People who are African American.  If you are 18-39 years of age, have your blood pressure checked every 3-5 years. If you are 40 years of age or older, have your blood pressure checked every year. You  should have your blood pressure measured twice-once when you are at a hospital or clinic, and once when you are not at a hospital or clinic. Record the average of the two measurements. To check your blood pressure when you are not at a hospital or clinic, you can use: ? An automated blood pressure machine at a pharmacy. ? A home blood pressure monitor.  If you are between 55 years and 79 years old, ask your health care provider if you should take aspirin to prevent strokes.  Have regular diabetes screenings. This involves taking a blood sample to check your fasting blood sugar level. ? If you are at a normal weight and have a low risk for diabetes, have this test once every three years after 81 years of age. ? If you are overweight and have a high risk for diabetes, consider being tested at a younger age or more often. Preventing infection Hepatitis B  If you have a higher risk for hepatitis B, you should be screened for this virus. You are considered at high risk for hepatitis B if: ? You were born in a country where hepatitis B is common. Ask your health care provider which countries are considered high risk. ? Your parents were born in a high-risk country, and you have not been immunized against hepatitis B (hepatitis B vaccine). ? You have HIV or AIDS. ? You use needles to inject street drugs. ? You live with someone who has hepatitis B. ?   You have had sex with someone who has hepatitis B. ? You get hemodialysis treatment. ? You take certain medicines for conditions, including cancer, organ transplantation, and autoimmune conditions.  Hepatitis C  Blood testing is recommended for: ? Everyone born from 1945 through 1965. ? Anyone with known risk factors for hepatitis C.  Sexually transmitted infections (STIs)  You should be screened for sexually transmitted infections (STIs) including gonorrhea and chlamydia if: ? You are sexually active and are younger than 81 years of age. ? You  are older than 81 years of age and your health care provider tells you that you are at risk for this type of infection. ? Your sexual activity has changed since you were last screened and you are at an increased risk for chlamydia or gonorrhea. Ask your health care provider if you are at risk.  If you do not have HIV, but are at risk, it may be recommended that you take a prescription medicine daily to prevent HIV infection. This is called pre-exposure prophylaxis (PrEP). You are considered at risk if: ? You are sexually active and do not regularly use condoms or know the HIV status of your partner(s). ? You take drugs by injection. ? You are sexually active with a partner who has HIV.  Talk with your health care provider about whether you are at high risk of being infected with HIV. If you choose to begin PrEP, you should first be tested for HIV. You should then be tested every 3 months for as long as you are taking PrEP. Pregnancy  If you are premenopausal and you may become pregnant, ask your health care provider about preconception counseling.  If you may become pregnant, take 400 to 800 micrograms (mcg) of folic acid every day.  If you want to prevent pregnancy, talk to your health care provider about birth control (contraception). Osteoporosis and menopause  Osteoporosis is a disease in which the bones lose minerals and strength with aging. This can result in serious bone fractures. Your risk for osteoporosis can be identified using a bone density scan.  If you are 65 years of age or older, or if you are at risk for osteoporosis and fractures, ask your health care provider if you should be screened.  Ask your health care provider whether you should take a calcium or vitamin D supplement to lower your risk for osteoporosis.  Menopause may have certain physical symptoms and risks.  Hormone replacement therapy may reduce some of these symptoms and risks. Talk to your health care  provider about whether hormone replacement therapy is right for you. Follow these instructions at home:  Schedule regular health, dental, and eye exams.  Stay current with your immunizations.  Do not use any tobacco products including cigarettes, chewing tobacco, or electronic cigarettes.  If you are pregnant, do not drink alcohol.  If you are breastfeeding, limit how much and how often you drink alcohol.  Limit alcohol intake to no more than 1 drink per day for nonpregnant women. One drink equals 12 ounces of beer, 5 ounces of wine, or 1 ounces of hard liquor.  Do not use street drugs.  Do not share needles.  Ask your health care provider for help if you need support or information about quitting drugs.  Tell your health care provider if you often feel depressed.  Tell your health care provider if you have ever been abused or do not feel safe at home. This information is not intended to replace   advice given to you by your health care provider. Make sure you discuss any questions you have with your health care provider. Document Released: 08/13/2010 Document Revised: 07/06/2015 Document Reviewed: 11/01/2014 Elsevier Interactive Patient Education  2018 [employer]  

## 2016-12-19 NOTE — Progress Notes (Signed)
Patient ID: Sara Russell, female   DOB: 1931-01-14, 83 y.o.   MRN: [id number] I have reviewed this visit and discussed with Howell Rucks, RN, BSN, and agree with her documentation.

## 2016-12-19 NOTE — Progress Notes (Addendum)
Subjective:   Sara Russell is a 83 y.o. female who presents for Medicare Annual (Subsequent) preventive examination.  Cardiac Risk Factors include: advanced age (>5mn, >>62women);sedentary lifestyle;dyslipidemia     Objective:     Vitals: BP (!) 190/78 (BP Location: Right Arm, Cuff Size: Normal)   Pulse 61   Temp 98.2 F (36.8 C)   Ht 5' 1"  (1.549 m)   Wt 155 lb (70.3 kg)   SpO2 98%   BMI 29.29 kg/m   Body mass index is 29.29 kg/m.  Initial BP 174/80. Patient States she has white coat syndrome. Reports at-home BP's ranging 140-150/ 60-70  Tobacco Social History   Tobacco Use  Smoking Status Former Smoker  . Packs/day: 0.50  . Years: 13.00  . Pack years: 6.50  . Types: Cigarettes  . Start date: 02/12/1948  . Last attempt to quit: 02/11/1961  . Years since quitting: 55.8  Smokeless Tobacco Never Used  Tobacco Comment   Denies use of tobacco since 1963     Counseling given: Yes Comment: Denies use of tobacco since 1963 Patient is former smoker with no plans to restart   Past Medical History:  Diagnosis Date  . Abdominal mass   . [facility]) ([facility] 09/15/2014  . [place]   . [place]itis, ulcerative ([facility]   . Cough    WHITE SPUTUM FOR 2 WEEKS, NO FEVER  . Degenerative joint disease   . Diverticulosis   . DVT (deep venous thrombosis) ([facility] 2009   BOTH LUNGS  . GERD (gastroesophageal reflux disease)   . Hyperlipidemia   . Hyperplastic colon polyp   . Hypertension   . Hypothyroidism   . Iron deficiency anemia    ON IRON AT TIMES  . PE (pulmonary embolism) 2009  . Persistent atrial fibrillation ([facility]   . Positive H. pylori test 01/20/96  . S/P dilatation of esophageal stricture 1992  . Skin cancer    h/o basal and squamous skin cancer--> removed   Past Surgical History:  Procedure Laterality Date  . APPENDECTOMY    . BREAST EXCISIONAL BIOPSY    . BREAST LUMPECTOMY     benign  . DILATION AND CURETTAGE OF UTERUS     for endometrial polyps  . JOINT REPLACEMENT    . ORIF ANKLE FRACTURE Left    X 2  . REPLACEMENT TOTAL KNEE     left  . SURGERY DONE TO REMOVE LEFT ANKLE HARDWARE LATER    . TONSILLECTOMY     Family History  Problem Relation Age of Onset  . Esophageal cancer Brother   . Diverticulitis Brother   . Heart disease Mother   . Stroke Mother   . Dementia Mother   . Heart attack Mother   . Heart disease Father   . Atrial fibrillation Father   . Breast cancer Maternal Aunt   . Colon cancer Neg Hx    Social History   Substance and Sexual Activity  Sexual Activity No    Outpatient Encounter Medications as of 12/19/2016  Medication Sig  . acetaminophen (TYLENOL ARTHRITIS PAIN) 650 MG CR tablet Take 650 mg by mouth daily.  .Marland Kitchenamiodarone (PACERONE) 100 MG tablet Take 1 tablet (100 mg total) by mouth daily.  .Marland Kitchenapixaban (ELIQUIS) 2.5 MG TABS tablet Take 1 tablet (2.5 mg total) by mouth 2 (two) times daily.  .Marland Kitchenatorvastatin (LIPITOR) 10 MG tablet Take 1 tablet (10 mg total) by mouth daily.  . carvedilol (COREG)  12.5 MG tablet Take 1 tablet (12.5 mg total) by mouth 2 (two) times daily with a meal.  . diphenhydramine-acetaminophen (TYLENOL PM) 25-500 MG TABS tablet Take 1 tablet by mouth at bedtime as needed.  . furosemide (LASIX) 20 MG tablet Take 1 tablet (20 mg total) by mouth every other day.  . levothyroxine (SYNTHROID, LEVOTHROID) 75 MCG tablet Take 1 tablet (75 mcg total) by mouth daily.  . methylcellulose oral powder Take by mouth daily.  Marland Kitchen omeprazole (PRILOSEC) 40 MG capsule Take 1 capsule (40 mg total) by mouth daily.   No facility-administered encounter medications on file as of 12/19/2016.     Activities of Daily Living In your present state of health, do you have any difficulty performing the following activities: 12/19/2016  Hearing? Y  Vision? Y  Difficulty concentrating or making decisions? N  Walking or climbing stairs? Y  Dressing or bathing? N  Doing errands, shopping? N    Preparing Food and eating ? N  Using the Toilet? N  In the past six months, have you accidently leaked urine? Y  Do you have problems with loss of bowel control? N  Managing your Medications? N  Managing your Finances? N  Housekeeping or managing your Housekeeping? N  Some recent data might be hidden   Home Safety: Patient resides at [place] My home has a working smoke alarm:  In every room           My home throw rugs have been fastened down to the floor or removed:  Removed I have a non-slip surface or non-slip mats in the bathtub and shower:  Shower with chair and grab bars. Toilet also has grab bars        All my home's stairs have handrails, including any outdoor stairs  Patient lives in first floor apt with no steps. Handrails on both sides of hallways        My home's floors, stairs and hallways are free from clutter, wires and cords:  Yes     I have animals in my home  No I wear seatbelts consistently:  Yes    Patient Care Team: Zenia Resides, MD as PCP - General Benard Rink (Dentistry) [place], [place], [place] (Optometry) Sydnee Levans, MD (Dermatology) Thompson Grayer, MD as Consulting Physician (Cardiology)    Assessment:     Exercise Activities and Dietary recommendations Current Exercise Habits: The patient does not participate in regular exercise at present, Exercise limited by: orthopedic condition(s)(unable to walk long distances 2/2 vertebral fx)  Goals    . Blood Pressure < 150/90      Fall Risk Fall Risk  12/19/2016 08/01/2016 06/27/2016 11/28/2015 03/01/2015  Falls in the past year? No No No No No  Number falls in past yr: - - - - -  Comment - - - - -   Depression Screen PHQ 2/9 Scores 12/19/2016 08/01/2016 06/27/2016 11/28/2015  PHQ - 2 Score 0 0 0 0     Cognitive Function MMSE - Mini Mental State Exam 11/26/2010  Orientation to time 5  Orientation to Place 5  Registration 3  Attention/ Calculation 5  Recall 3   Language- name 2 objects 2  Language- repeat 1  Language- follow 3 step command 3  Language- read & follow direction 1  Write a sentence 1  Copy design 1  Total score 30     TUG Test:  Done in 16 seconds. Patient used both hands to push out of  chair and to sit back down. Falls prevention discussed in detail and literature given.  Cognitive Function: Mini-Cog  Passed with score 3/5    Immunization History  Administered Date(s) Administered  . H1N1 03/04/2008  . Influenza Split 11/12/2010, 11/26/2011  . Influenza Whole 11/26/2006, 11/03/2007, 12/10/2007, 11/16/2008, 11/20/2009  . Influenza,inj,Quad PF,6+ Mos 11/24/2012, 11/29/2013, 10/14/2014, 11/28/2015, 12/04/2016  . PPD Test 10/03/2014  . Pneumococcal Conjugate-13 09/10/2013  . Pneumococcal Polysaccharide-23 02/12/2000  . Td 02/11/1998, 04/13/2008  . Zoster 05/08/2006   Discussed Shingrix  Screening Tests Health Maintenance  Topic Date Due  . TETANUS/TDAP  04/14/2018  . INFLUENZA VACCINE  Completed  . DEXA SCAN  Completed  . PNA vac Low Risk Adult  Completed      Plan:     Patient will schedule appt with PCP if home BP readings become elevated.  Patient is scheduled to have cataracts removed in Jan 2019 with Dr. Talbert Forest  I have personally reviewed and noted the following in the patient's chart:   . Medical and social history . Use of alcohol, tobacco or illicit drugs  . Current medications and supplements . Functional ability and status . Nutritional status . Physical activity . Advanced directives . List of other physicians . Hospitalizations, surgeries, and ER visits in previous 12 months . Vitals . Screenings to include cognitive, depression, and falls . Referrals and appointments  In addition, I have reviewed and discussed with patient certain preventive protocols, quality metrics, and best practice recommendations. A written personalized care plan for preventive services as well as general preventive  health recommendations were provided to patient.     Velora Heckler, RN  12/19/2016

## 2016-12-20 ENCOUNTER — Encounter: Payer: Self-pay | Admitting: *Deleted

## 2017-01-27 ENCOUNTER — Telehealth: Payer: Self-pay | Admitting: *Deleted

## 2017-01-27 ENCOUNTER — Other Ambulatory Visit: Payer: Self-pay

## 2017-01-27 ENCOUNTER — Ambulatory Visit (INDEPENDENT_AMBULATORY_CARE_PROVIDER_SITE_OTHER): Payer: Medicare Other | Admitting: Family Medicine

## 2017-01-27 ENCOUNTER — Encounter: Payer: Self-pay | Admitting: Family Medicine

## 2017-01-27 DIAGNOSIS — M25511 Pain in right shoulder: Secondary | ICD-10-CM | POA: Diagnosis not present

## 2017-01-27 MED ORDER — TRAMADOL HCL 50 MG PO TABS: 50.0000 mg | ORAL_TABLET | Freq: Three times a day (TID) | ORAL | 0 refills | Status: DC | PRN

## 2017-01-27 NOTE — Progress Notes (Signed)
Subjective:    Sara Russell is a 83 y.o. female who presents to [facility] today for Right shoulder pain:  1.  Right shoulder pain:  New onset last Friday.  Woke with pain posterior aspect of shoulder.  Worsened with activity and time.  This AM, she could not raise her arm.  No pain at rest.  No redness or swelling.  No injury.  Having difficulty performing ADLs.  Had biceps rupture Left arm this past spring.  Never trouble with this arm.  She is right-handed.    No CP or dyspnea.     ROS as above per HPI.    The following portions of the patient's history were reviewed and updated as appropriate: allergies, current medications, past medical history, family and social history, and problem list. Patient is a nonsmoker.    PMH reviewed.  Medications reviewed.    Objective:   Physical Exam BP (!) 178/72   Pulse (!) 59   Temp 97.9 F (36.6 C) (Oral)   Ht 5' 1"  (1.549 m)   Wt 157 lb 12.8 oz (71.6 kg)   SpO2 98%   BMI 29.82 kg/m  Gen:  Alert, cooperative patient who appears stated age in no acute distress.  Vital signs reviewed. HEENT: EOMI,  MMM Cardiac:  Regular rate and rhythm without murmur auscultated.  Good S1/S2. Pulm:  Clear to auscultation bilaterally with good air movement.  No wheezes or rales noted.   MSK:  TTP along posterior aspect of joint.  Unable to raise arm past about 20 degrees forward or lateral motion.  Unable to comply with rest of shoulder exam testing. No redness/bruising/swelling of joint noted.  No real bony tenderness.  Passive ROM is good to about 90 degrees.   No results found for this or any previous visit (from the past 72 hour(s)).

## 2017-01-27 NOTE — Telephone Encounter (Signed)
Pt states that on Friday she started having shoulder/back tenderness.  It has gradually gotten worse to the point that she can not lift her shoulder.  She is agreeable to an appt with another provided as she cant wait til Wednesday to see Dr. Andria Frames.   Pt can be transported from [facility] and took back.  They will not be able to take her to get imaging studies same day if needed.   If walk-in imaging is needed (xray) would suggest [facility] staff transport her there and have [facility] pick her up at [place].  Marsheila Alejo, Salome Spotted, CMA

## 2017-01-27 NOTE — Patient Instructions (Addendum)
It was good to see you today.    We are going to get you seen on Tuesday for your shoulder.  In the meantime, take the Tramadol as needed for pain relief.  Continue taking the Tylenol.  Try to keep as much movement in your arm as possible.

## 2017-01-27 NOTE — Telephone Encounter (Signed)
Noted and agree. 

## 2017-01-28 ENCOUNTER — Emergency Department (HOSPITAL_COMMUNITY)
Admission: EM | Admit: 2017-01-28 | Discharge: 2017-01-28 | Disposition: A | Discharge status: Home / Self Care | Payer: Medicare Other | Attending: Emergency Medicine | Admitting: Emergency Medicine

## 2017-01-28 ENCOUNTER — Emergency Department (HOSPITAL_COMMUNITY): Payer: Medicare Other

## 2017-01-28 ENCOUNTER — Ambulatory Visit (INDEPENDENT_AMBULATORY_CARE_PROVIDER_SITE_OTHER): Payer: Medicare Other | Admitting: Sports Medicine

## 2017-01-28 ENCOUNTER — Other Ambulatory Visit: Payer: Self-pay

## 2017-01-28 ENCOUNTER — Encounter (HOSPITAL_COMMUNITY): Payer: Self-pay

## 2017-01-28 VITALS — BP 154/64 | Ht 61.0 in | Wt 157.0 lb

## 2017-01-28 DIAGNOSIS — Z79899 Other long term (current) drug therapy: Secondary | ICD-10-CM | POA: Insufficient documentation

## 2017-01-28 DIAGNOSIS — Y929 Unspecified place or not applicable: Secondary | ICD-10-CM | POA: Diagnosis not present

## 2017-01-28 DIAGNOSIS — I110 Hypertensive heart disease with heart failure: Secondary | ICD-10-CM | POA: Insufficient documentation

## 2017-01-28 DIAGNOSIS — E785 Hyperlipidemia, unspecified: Secondary | ICD-10-CM | POA: Diagnosis not present

## 2017-01-28 DIAGNOSIS — S42114A Nondisplaced fracture of body of scapula, right shoulder, initial encounter for closed fracture: Secondary | ICD-10-CM | POA: Insufficient documentation

## 2017-01-28 DIAGNOSIS — Y998 Other external cause status: Secondary | ICD-10-CM | POA: Insufficient documentation

## 2017-01-28 DIAGNOSIS — Z86718 Personal history of other venous thrombosis and embolism: Secondary | ICD-10-CM | POA: Insufficient documentation

## 2017-01-28 DIAGNOSIS — R911 Solitary pulmonary nodule: Secondary | ICD-10-CM | POA: Diagnosis not present

## 2017-01-28 DIAGNOSIS — Y939 Activity, unspecified: Secondary | ICD-10-CM | POA: Diagnosis not present

## 2017-01-28 DIAGNOSIS — M25511 Pain in right shoulder: Secondary | ICD-10-CM

## 2017-01-28 DIAGNOSIS — E039 Hypothyroidism, unspecified: Secondary | ICD-10-CM | POA: Diagnosis not present

## 2017-01-28 DIAGNOSIS — Z87891 Personal history of nicotine dependence: Secondary | ICD-10-CM | POA: Diagnosis not present

## 2017-01-28 DIAGNOSIS — W19XXXA Unspecified fall, initial encounter: Secondary | ICD-10-CM | POA: Insufficient documentation

## 2017-01-28 DIAGNOSIS — S0990XA Unspecified injury of head, initial encounter: Secondary | ICD-10-CM | POA: Diagnosis not present

## 2017-01-28 DIAGNOSIS — S4991XA Unspecified injury of right shoulder and upper arm, initial encounter: Secondary | ICD-10-CM | POA: Diagnosis present

## 2017-01-28 DIAGNOSIS — S0003XA Contusion of scalp, initial encounter: Secondary | ICD-10-CM | POA: Diagnosis not present

## 2017-01-28 DIAGNOSIS — Z7901 Long term (current) use of anticoagulants: Secondary | ICD-10-CM | POA: Insufficient documentation

## 2017-01-28 DIAGNOSIS — I481 Persistent atrial fibrillation: Secondary | ICD-10-CM | POA: Insufficient documentation

## 2017-01-28 DIAGNOSIS — I5032 Chronic diastolic (congestive) heart failure: Secondary | ICD-10-CM | POA: Insufficient documentation

## 2017-01-28 DIAGNOSIS — S064X0A Epidural hemorrhage without loss of consciousness, initial encounter: Secondary | ICD-10-CM | POA: Diagnosis not present

## 2017-01-28 MED ORDER — OXYCODONE-ACETAMINOPHEN 5-325 MG PO TABS
1.0000 | ORAL_TABLET | Freq: Once | ORAL | Status: AC
  Administered 2017-01-28: 1 via ORAL
  Filled 2017-01-28: qty 1

## 2017-01-28 MED ORDER — METHYLPREDNISOLONE ACETATE 40 MG/ML IJ SUSP
40.0000 mg | Freq: Once | INTRAMUSCULAR | Status: AC
  Administered 2017-01-28: 40 mg via INTRA_ARTICULAR

## 2017-01-28 MED ORDER — ACETAMINOPHEN 325 MG PO TABS
650.0000 mg | ORAL_TABLET | Freq: Once | ORAL | Status: AC
  Administered 2017-01-28: 650 mg via ORAL
  Filled 2017-01-28: qty 2

## 2017-01-28 NOTE — ED Provider Notes (Signed)
[name] EMERGENCY DEPARTMENT Provider Note   CSN: [id number] Arrival date & time: 01/28/17  1200     History   Chief Complaint Chief Complaint  Patient presents with  . Fall    HPI Sara Russell is a 83 y.o. female w/ h/o atrial fibrillation on amiodarone and eloquis, pulmonary embolism presents for evaluation after fall. Reports posterior scalp swelling, mild tenderness and dizziness since fall. Denies LOC. Denies prodromal symptoms of chest pain, SOB, palpitations, light-headedness, dizziness, nausea. Denies vision changes, nausea, vomiting, slurred speech, neck pain, unilateral numbness or weakness. Was on her way to radiology to get right shoulder x-ray ordered by orthopedist earlier today. She was evaluated by orthopedist for right shoulder pain since Friday, she received an infection at office.  HPI  Past Medical History:  Diagnosis Date  . Abdominal mass   . AKI (acute kidney injury) ([place]) 09/15/2014  . Atrial fibrillation ([place])   . Colitis, ulcerative ([place])   . Cough    WHITE SPUTUM FOR 2 WEEKS, NO FEVER  . Degenerative joint disease   . Diverticulosis   . DVT (deep venous thrombosis) ([place]) 2009   BOTH LUNGS  . GERD (gastroesophageal reflux disease)   . Hyperlipidemia   . Hyperplastic colon polyp   . Hypertension   . Hypothyroidism   . Iron deficiency anemia    ON IRON AT TIMES  . PE (pulmonary embolism) 2009  . Persistent atrial fibrillation ([place])   . Positive H. pylori test 01/20/96  . S/P dilatation of esophageal stricture 1992  . Skin cancer    h/o basal and squamous skin cancer--> removed    Patient Active Problem List   Diagnosis Date Noted  . Acute pain of right shoulder 01/28/2017  . Hypothyroidism 04/04/2015  . Compression fracture of L1 lumbar vertebra (H[facility]) 02/23/2015  . Choledocholithiasis 02/23/2015  . Encounter for palliative care   . Chronic diastolic congestive heart failure ([place])   . Osteoporosis 05/13/2014  .  Gout 08/16/2013  . Left atrial enlargement 01/10/2013  . A-fib ([place]) 12/08/2012  . Rosacea 05/15/2012  . Anemia due to chronic blood loss 08/28/2010  . DEGENERATIVE DISC DISEASE, LUMBAR SPINE 07/14/2009  . GERD 07/11/2009  . PERSONAL HX COLONIC POLYPS 07/11/2009  . HYPERCHOLESTEROLEMIA 04/10/2006  . HYPERTENSION, BENIGN SYSTEMIC 04/10/2006  . History of pulmonary embolism 04/10/2006  . DIVERTICULOSIS OF COLON 04/10/2006  . ACTINIC KERATOSIS 04/10/2006  . GEN OSTEOARTHROSIS INVOLVING MULTIPLE SITES 04/10/2006    Past Surgical History:  Procedure Laterality Date  . APPENDECTOMY    . BREAST EXCISIONAL BIOPSY    . BREAST LUMPECTOMY     benign  . DILATION AND CURETTAGE OF UTERUS     for endometrial polyps  . ENDOSCOPIC RETROGRADE CHOLANGIOPANCREATOGRAPHY (ERCP) WITH PROPOFOL N/A 03/23/2015   Procedure: ENDOSCOPIC RETROGRADE CHOLANGIOPANCREATOGRAPHY (ERCP) WITH PROPOFOL;  Surgeon: Milus Banister, MD;  Location: [facility] ENDOSCOPY;  Service: Endoscopy;  Laterality: N/A;  . JOINT REPLACEMENT    . ORIF ANKLE FRACTURE Left    X 2  . REPLACEMENT TOTAL KNEE     left  . SURGERY DONE TO REMOVE LEFT ANKLE HARDWARE LATER    . TONSILLECTOMY      OB History    No data available       Home Medications    Prior to Admission medications   Medication Sig Start Date End Date Taking? Authorizing Provider  acetaminophen (TYLENOL ARTHRITIS PAIN) 650 MG CR tablet Take 650 mg by mouth daily.  [provider]  amiodarone (PACERONE) 100 MG tablet Take 1 tablet (100 mg total) by mouth daily. 10/03/16   Zenia Resides, MD  apixaban (ELIQUIS) 2.5 MG TABS tablet Take 1 tablet (2.5 mg total) by mouth 2 (two) times daily. 10/03/16   Zenia Resides, MD  atorvastatin (LIPITOR) 10 MG tablet Take 1 tablet (10 mg total) by mouth daily. 10/03/16   Zenia Resides, MD  carvedilol (COREG) 12.5 MG tablet Take 1 tablet (12.5 mg total) by mouth 2 (two) times daily with a meal. 10/03/16   Hensel, Jamal Collin,  MD  diphenhydramine-acetaminophen (TYLENOL PM) 25-500 MG TABS tablet Take 1 tablet by mouth at bedtime as needed.    [provider]  furosemide (LASIX) 20 MG tablet Take 1 tablet (20 mg total) by mouth every other day. 10/03/16   Zenia Resides, MD  levothyroxine (SYNTHROID, LEVOTHROID) 75 MCG tablet Take 1 tablet (75 mcg total) by mouth daily. 10/03/16   Zenia Resides, MD  methylcellulose oral powder Take by mouth daily.    [provider]  omeprazole (PRILOSEC) 40 MG capsule Take 1 capsule (40 mg total) by mouth daily. 10/03/16   Zenia Resides, MD  traMADol (ULTRAM) 50 MG tablet Take 1 tablet (50 mg total) by mouth every 8 (eight) hours as needed. 01/27/17   Alveda Reasons, MD    Family History Family History  Problem Relation Age of Onset  . Esophageal cancer Brother   . Diverticulitis Brother   . Heart disease Mother   . Stroke Mother   . Dementia Mother   . Heart attack Mother   . Heart disease Father   . Atrial fibrillation Father   . Breast cancer Maternal Aunt   . Colon cancer Neg Hx     Social History Social History   Tobacco Use  . Smoking status: Former Smoker    Packs/day: 0.50    Years: 13.00    Pack years: 6.50    Types: Cigarettes    Start date: 02/12/1948    Last attempt to quit: 02/11/1961    Years since quitting: 56.0  . Smokeless tobacco: Never Used  . Tobacco comment: Denies use of tobacco since 1963  Substance Use Topics  . Alcohol use: Yes    Alcohol/week: 3.0 oz    Types: 5 Glasses of wine per week    Comment: WINE FEW TIMES PER WEEK  . Drug use: No     Allergies   Iohexol; Ivp dye [iodinated diagnostic agents]; Scallops [shellfish allergy]; Sulfa antibiotics; and Sulfamethoxazole   Review of Systems Review of Systems  Musculoskeletal: Positive for arthralgias.  Skin: Positive for wound.  Neurological: Positive for dizziness.  Hematological: Bruises/bleeds easily.  All other systems reviewed and are  negative.    Physical Exam Updated Vital Signs BP (!) 168/65   Pulse (!) 57   Temp 97.8 F (36.6 C) (Oral)   Resp 16   SpO2 96%   Physical Exam  Constitutional: She is oriented to person, place, and time. She appears well-developed and well-nourished. No distress.  NAD. Alert and oriented to self, place, event in time.  HENT:  Head: Normocephalic and atraumatic.  Right Ear: External ear normal.  Left Ear: External ear normal.  Nose: Nose normal.   5 x 5 cm hematoma to crown/occipital scalp, appropriate tender without overlying skin injury. No compression or crepitus of facial or scalp bones. No evidence of facial injury.  Eyes: Conjunctivae are normal. No  scleral icterus.  Neck: Normal range of motion. Neck supple.  No midline C-spine or paraspinal muscle tenderness. Painless, passive range of motion of neck.  Cardiovascular: Normal rate, regular rhythm and normal heart sounds.  No murmur heard. 2+ distal pulses bilaterally. No lower extremity edema or calf tenderness. Regular rate.  Pulmonary/Chest: Effort normal and breath sounds normal. She has no wheezes.  Abdominal: Soft. There is no tenderness.  Musculoskeletal: Normal range of motion. She exhibits tenderness. She exhibits no deformity.  + Posterior , diffuse scapular pain on the right. Decreased passive range of motion of the right upper extremity to pain. No pain with passive range of motion of upper and lower extremities. No midline CTL spine tenderness.  Neurological: She is alert and oriented to person, place, and time.  Skin: Skin is warm and dry. Capillary refill takes less than 2 seconds.  Psychiatric: She has a normal mood and affect. Her behavior is normal. Judgment and thought content normal.  Nursing note and vitals reviewed.    ED Treatments / Results  Labs (all labs ordered are listed, but only abnormal results are displayed) Labs Reviewed - No data to display  EKG  EKG Interpretation None        Radiology Dg Shoulder Right  Result Date: 01/28/2017 CLINICAL DATA:  Right shoulder pain for 5 days. No reported injury. History of arthritis. EXAM: RIGHT SHOULDER - 2+ VIEW COMPARISON:  Chest x-ray 11/2014, 09/17/2014 FINDINGS: Almyra Brace is noted traversing the base of the acromion. Fracture cannot be excluded. No evidence of dislocation or separation. Diffuse osteopenia . Acromioclavicular and glenohumeral degenerative change. IMPRESSION: 1. Lucency is noted traversing the base of the acromion. Fracture cannot be excluded. 2. Diffuse osteopenia. Acromioclavicular and glenohumeral degenerative change. Electronically Signed   By: Marcello Moores  Register   On: 01/28/2017 13:02   Ct Head Wo Contrast  Result Date: 01/28/2017 CLINICAL DATA:  Fall this morning. EXAM: CT HEAD WITHOUT CONTRAST TECHNIQUE: Contiguous axial images were obtained from the base of the skull through the vertex without intravenous contrast. COMPARISON:  03/20/2014 FINDINGS: Brain: There is atrophy and chronic small vessel disease changes. No acute intracranial abnormality. Specifically, no hemorrhage, hydrocephalus, mass lesion, acute infarction, or significant intracranial injury. Vascular: No hyperdense vessel or unexpected calcification. Skull: No acute calvarial abnormality. Sinuses/Orbits: Visualized paranasal sinuses and mastoids clear. Orbital soft tissues unremarkable. Other: None IMPRESSION: No acute intracranial abnormality. Atrophy, chronic microvascular disease. Electronically Signed   By: Rolm Baptise M.D.   On: 01/28/2017 13:08   Ct Shoulder Right Wo Contrast  Result Date: 01/28/2017 CLINICAL DATA:  Acute onset right shoulder pain since last Friday. No traumatic injury. EXAM: CT OF THE UPPER RIGHT EXTREMITY WITHOUT CONTRAST TECHNIQUE: Multidetector CT imaging of the upper right extremity was performed according to the standard protocol. COMPARISON:  Right shoulder x-rays from same day. FINDINGS: Bones/Joint/Cartilage There  is an acute, nondisplaced fracture through the scapular spine at the base of the acromion. No additional fractures seen. High-riding humeral head. Moderate acromioclavicular and mild glenohumeral degenerative changes. Small glenohumeral joint effusion. Ligaments Suboptimally assessed by CT. Muscles and Tendons Mild fatty atrophy of all four rotator cuff muscles. Soft tissues Small amount of hematoma surrounding the fracture. 6 mm pulmonary nodule in the right upper lobe (series 4, image 72). IMPRESSION: 1. Acute, nondisplaced fracture in through the scapular spine at the base of the acromion. Small amount of surrounding hematoma. 2. High-riding humeral head with mild fatty atrophy of all four rotator cuff muscles, suggestive of  chronic underlying rotator cuff tendon pathology. 3. Moderate acromioclavicular and mild glenohumeral osteoarthritis. Small glenohumeral joint effusion. 4. 6 mm pulmonary nodule in the right upper lobe. Non-contrast chest CT at 6-12 months is recommended. If the nodule is stable at time of repeat CT, then future CT at 18-24 months (from today's scan) is considered optional for low-risk patients, but is recommended for high-risk patients. This recommendation follows the consensus statement: Guidelines for Management of Incidental Pulmonary Nodules Detected on CT Images: From the Fleischner Society 2017; Radiology 2017; 284:228-243. Electronically Signed   By: Titus Dubin M.D.   On: 01/28/2017 15:08    Procedures Procedures (including critical care time)  Medications Ordered in ED Medications  oxyCODONE-acetaminophen (PERCOCET/ROXICET) 5-325 MG per tablet 1 tablet (1 tablet Oral Given 01/28/17 1530)  acetaminophen (TYLENOL) tablet 650 mg (650 mg Oral Given 01/28/17 1530)     Initial Impression / Assessment and Plan / ED Course  I have reviewed the triage vital signs and the nursing notes.  Pertinent labs & imaging results that were available during my care of the patient  were reviewed by me and considered in my medical decision making (see chart for details).  Clinical Course as of Jan 29 1536  Tue Jan 28, 2017  1327 Discussed results with patient. Continues to remain asymptomatic and comfortable. X-ray of shoulder shows lucency over the right acromion.   [CG]    Clinical Course User Index [CG] Kinnie Feil, PA-C    83 year old female with history of atrial fibrillation and pulmonary embolism on amiodarone and Coumadin presents for evaluation after mechanical fall with posterior head trauma. Exam is unremarkable other than appropriate tender posterior/coronal hematoma. She had been evaluated by orthopedist in clinic earlier this morning for acute right shoulder pain. She was actually on her way to get right shoulder x-rays when she fell on her way into the building. She denies LOC after fall. States her shoulder pain is similar to the way it was before her fall.   CT scan of head negative. EKG unremarkable. Right shoulder x-ray was obtained which shows lucency over the acromion process. I spoke to Dr. Micheline Chapman who recommends CT of scapula/shoulder for further evaluation. Suspecting fracture given fall today.  Final Clinical Impressions(s) / ED Diagnoses   Patient with acute, nondisplaced fracture across spine of scapula at the base of the acromion where she is focally tender. Extremity is neurovascularly intact. She is declining analgesia and states the pain is tolerable and only really bothers her and she moves her arm. I spoke to her orthopedist Dr. Micheline Chapman who recommends sling immobilization. With this, patient is high risk for falls given age. Will give strict fall precautions. Dr. Micheline Chapman will call patient to make appointment within 7-10 days. Patient was notified of incidental finding of right upper lobe pulmonary nodule, she is aware she needs follow-up CT for this. Final diagnoses:  Fall, initial encounter  Pulmonary nodule    ED Discharge Orders     None       Arlean Hopping 01/28/17 1537    Duffy Bruce, MD 01/28/17 [phone]

## 2017-01-28 NOTE — ED Notes (Signed)
ED Provider at bedside. 

## 2017-01-28 NOTE — ED Notes (Signed)
Patient transported to CT 

## 2017-01-28 NOTE — Progress Notes (Signed)
HPI  CC: R shoulder pain  83 yo woman with PMH significant for osteoporosis not on bisphosphonates and chronic anticoagulation on Eliquis presents with new onset R shoulder pain. She woke up with it on Friday morning. No trauma or injury or popping. Pain only occurs with movement and not at rest, has been getting progressively worse to the point that she is having difficulty performing her ADLs. Prior to this she was able to move her R shoulder well but perhaps had been overusing due to her L shoulder issues from biceps tendon rupture in April.   Traumatic: no (Yes? MOI) No injury/trauma. No popping sensation. Location: L posterior shoulder  Quality: sharp with movement, none at rest (type of pain)  Duration: woke up with it on Friday morning  Timing: intermittent (nighttime/exercise/prolonged sitting)  Improving/Worsening: worsening  Makes better: rest  Makes worse: movement  Associated symptoms: no numbness or tingling.   Previous Interventions Tried: none for shoulder, had knee injections in the past  Past Injuries: L biceps tendon rupture in April that self resolved.  Several broken bones 2/2 osteoporosis  Past Surgeries: L total knee  Smoking: former smoker, quit 1963  Family Hx: no contributory    ROS: Per HPI; in addition no fever, no rash, no additional weakness, no additional numbness, no additional paresthesias, and no additional falls/injury.   Objective: BP (!) 154/64   Ht 5' 1"  (1.549 m)   Wt 157 lb (71.2 kg)   BMI 29.66 kg/m  Gen: R-Hand Dominant. NAD, well groomed, a/o x3, normal affect.  CV: Well-perfused. Warm.  Resp: Non-labored.  Shoulder, R: Exam extremely limited due to pain. TTP noted at the posterior joint. No evidence of bony deformity, asymmetry, or muscle atrophy; No tenderness over long head of biceps (bicipital groove). No TTP at [facility] joint. Very limited AROM (15 flex Raeanne Barry Allie Bossier Nichola Sizer /5IR) and passive ROM (60 flex Weston Brass /90Abd /60ER  /15IR). Strength 2/5 with flexion and external rotation. Sensation intact. Peripheral pulses intact.  Special Tests:   - Crossarm test: unable to perform   - Empty can: unable to perform   - Hawkins: unable to perform   - Neer test: unable to perform   - Yergason's: positive  Assessment and Plan:  Acute pain of right shoulder Acute R shoulder pain only with movement without h/o injury. Most likely bursitis. DDx also could include bony injury as PMH significant for untreated osteoporosis unable to to tolerate bisphosphonates.  - steroid injection today, see procedure note below - AP and lateral xrays - Follow up in 2 weeks   Orders Placed This Encounter  Procedures  . DG Shoulder Right    Standing Status:   Future    Standing Expiration Date:   03/31/2018    Order Specific Question:   Reason for Exam (SYMPTOM  OR DIAGNOSIS REQUIRED)    Answer:   right shoulder pain    Order Specific Question:   Preferred imaging location?    Answer:   [facility]    Order Specific Question:   Radiology Contrast Protocol - do NOT remove file path    Answer:   [email or web address]    Meds ordered this encounter  Medications  . methylPREDNISolone acetate (DEPO-MEDROL) injection 40 mg   INJECTION: Patient was given informed consent, signed copy in the chart. Appropriate time out was taken. Area prepped in usual fashion with overlying skin cleaned using isopropyl alcohol. Local anesthesia achieved using Ethyl Chloride spray (Cooling spray). 1 cc of  methylprednisolone 40 mg/ml plus  3 cc of 1% lidocaine without epinephrine was injected into the R subacromial bursa using a(n) posterior approach. The patient tolerated the procedure well. There were no complications.    Bufford Lope, DO PGY-2, [facility] 01/28/2017 9:54 AM   Patient seen and evaluated with the resident. I agree with the above plan of care. Unfortunately, as the  patient was going to [facility] imaging to get her x-rays she fell and was transferred to the emergency room via EMS. They were able to do the x-ray of her right shoulder in the emergency room which suggested a nondisplaced fracture at the base of the acromium. This was verified on CT scan. This was likely a result of her fall and not the initial cause of her shoulder pain (CT scan also shows rotator cuff tendinopathy and atrophy). Advise the physician assistant in the emergency room taking care of the patient to place her in a sling and she will follow-up with [place] in 7-10 days. She is at risk of falling so fall precautions were ordered as well. This appears to be a fairly stable fracture and I will get follow-up imaging in 7-10 days. Patient has tramadol to take as needed for pain. She does not want anything stronger.

## 2017-01-28 NOTE — ED Notes (Signed)
Upon being DC, pt ortho MD called back to report they also wanted a CT.

## 2017-01-28 NOTE — Assessment & Plan Note (Addendum)
Acute R shoulder pain only with movement without h/o injury. Most likely bursitis. DDx also could include bony injury as PMH significant for untreated osteoporosis unable to to tolerate bisphosphonates.  - steroid injection today, see procedure note below - AP and lateral xrays - continue tramadol prn - no sling as patient has balance issues at baseline - Follow up in 2 weeks

## 2017-01-28 NOTE — ED Provider Notes (Signed)
Medical screening examination/treatment/procedure(s) were conducted as a shared visit with non-physician practitioner(s) and myself.  I personally evaluated the patient during the encounter. Briefly, the patient is a 83 yo F with h/o DVT/PE on Eliquis here with head injury after mechanical fall. Small hematoma on occipiut. CT head shows NAICA. Pt monitored in ED with no worsening hA, visual changes, n/v, focal neuro sx, or other concerning signs. She is at her mental baseline.   EKG Interpretation None          Duffy Bruce, MD 01/28/17 1651

## 2017-01-28 NOTE — Assessment & Plan Note (Signed)
-   Question of rotator cuff injury, nontraumatic.  Would like to obtain US imaging to examine area as she is really unable to comply with any of my testing.   - Option of corticosteroid injection, but instead will have her FU soon (tomorrow) with sports medicine for possible imaging studies.  - Analgesia tonight with Tramadol.  She cannot take NSAIDs.  No bony tenderness on my exam.

## 2017-01-28 NOTE — ED Notes (Signed)
Patient transported to X-ray 

## 2017-01-28 NOTE — ED Triage Notes (Signed)
Pt arrived via [facility] from her ortho MD where pt hit her head after falling today. Pt reports she is on Eliquis and wanted to be evaluated. Denies any pain in her head, neck, or back at this time.

## 2017-01-28 NOTE — Discharge Instructions (Addendum)
You have a fracture across the spine of your right scapula. I spoke to Dr. Micheline Chapman who recommends sling immobilization. This puts you at higher risk for falls, be extra cautious when ambulating and ensure you have assist as needed. Dr. Alinda Money office will call you to schedule an appointment within 7-10 days. Follow up with orthopedist as scheduled. In regards to your shoulder pain, 1000 mg of Tylenol every 8 hours with tramadol should alleviate some of the pain.  The CT scan of your shoulder showed a right upper lobe pulmonary nodule measuring 6 mm. A repeat chest CT scan is recommended at 6-12 months. Please follow up with a primary care doctor to schedule this.  Your CT scan head was unremarkable. Return to the ED if he develops severe headache, vision changes, nausea, vomiting, unilateral numbness or weakness or any other concerning symptoms.

## 2017-01-29 ENCOUNTER — Encounter: Payer: Self-pay | Admitting: Sports Medicine

## 2017-01-29 ENCOUNTER — Ambulatory Visit: Payer: Medicare Other | Admitting: Family Medicine

## 2017-01-29 ENCOUNTER — Telehealth: Payer: Self-pay | Admitting: *Deleted

## 2017-01-29 ENCOUNTER — Encounter: Payer: Self-pay | Admitting: *Deleted

## 2017-01-29 DIAGNOSIS — R918 Other nonspecific abnormal finding of lung field: Secondary | ICD-10-CM | POA: Insufficient documentation

## 2017-01-29 NOTE — Telephone Encounter (Signed)
Not in breast.  It is a pulmonary nodule.

## 2017-01-29 NOTE — Telephone Encounter (Signed)
Patient left message on nurse line asking if PCP was aware of fall yesterday. Discussed with PCP. Patient notified that PCP is aware of fall and incidental finding of RUL pulmonary nodule. Patient states when she had left breast lumpectomy prior to 1995 they also found a "fatty nodule" in right breast and wonders if this is same nodule. Will discuss with PCP at next OV. Hubbard Hartshorn, RN, BSN

## 2017-01-31 ENCOUNTER — Emergency Department (HOSPITAL_COMMUNITY)
Admission: EM | Admit: 2017-01-31 | Discharge: 2017-01-31 | Disposition: A | Discharge status: Home / Self Care | Payer: Medicare Other | Attending: Emergency Medicine | Admitting: Emergency Medicine

## 2017-01-31 ENCOUNTER — Other Ambulatory Visit: Payer: Self-pay

## 2017-01-31 ENCOUNTER — Encounter (HOSPITAL_COMMUNITY): Payer: Self-pay

## 2017-01-31 ENCOUNTER — Emergency Department (HOSPITAL_COMMUNITY): Payer: Medicare Other

## 2017-01-31 DIAGNOSIS — E039 Hypothyroidism, unspecified: Secondary | ICD-10-CM | POA: Insufficient documentation

## 2017-01-31 DIAGNOSIS — R042 Hemoptysis: Secondary | ICD-10-CM | POA: Insufficient documentation

## 2017-01-31 DIAGNOSIS — Z96652 Presence of left artificial knee joint: Secondary | ICD-10-CM | POA: Insufficient documentation

## 2017-01-31 DIAGNOSIS — R918 Other nonspecific abnormal finding of lung field: Secondary | ICD-10-CM | POA: Diagnosis not present

## 2017-01-31 DIAGNOSIS — R1110 Vomiting, unspecified: Secondary | ICD-10-CM | POA: Diagnosis present

## 2017-01-31 DIAGNOSIS — I5032 Chronic diastolic (congestive) heart failure: Secondary | ICD-10-CM | POA: Diagnosis not present

## 2017-01-31 DIAGNOSIS — Z87891 Personal history of nicotine dependence: Secondary | ICD-10-CM | POA: Insufficient documentation

## 2017-01-31 DIAGNOSIS — Z79899 Other long term (current) drug therapy: Secondary | ICD-10-CM | POA: Insufficient documentation

## 2017-01-31 DIAGNOSIS — Z85828 Personal history of other malignant neoplasm of skin: Secondary | ICD-10-CM | POA: Diagnosis not present

## 2017-01-31 DIAGNOSIS — I110 Hypertensive heart disease with heart failure: Secondary | ICD-10-CM | POA: Diagnosis not present

## 2017-01-31 DIAGNOSIS — Z7901 Long term (current) use of anticoagulants: Secondary | ICD-10-CM | POA: Insufficient documentation

## 2017-01-31 DIAGNOSIS — E871 Hypo-osmolality and hyponatremia: Secondary | ICD-10-CM | POA: Diagnosis not present

## 2017-01-31 LAB — COMPREHENSIVE METABOLIC PANEL
ALT: 14 U/L (ref 14–54)
AST: 22 U/L (ref 15–41)
Albumin: 3.8 g/dL (ref 3.5–5.0)
Alkaline Phosphatase: 81 U/L (ref 38–126)
Anion gap: 9 (ref 5–15)
BUN: 15 mg/dL (ref 6–20)
CO2: 23 mmol/L (ref 22–32)
Calcium: 8.5 mg/dL — ABNORMAL LOW (ref 8.9–10.3)
Chloride: 95 mmol/L — ABNORMAL LOW (ref 101–111)
Creatinine, Ser: 1.08 mg/dL — ABNORMAL HIGH (ref 0.44–1.00)
GFR calc Af Amer: 53 mL/min — ABNORMAL LOW (ref >60)
GFR calc non Af Amer: 46 mL/min — ABNORMAL LOW (ref >60)
Glucose, Bld: 110 mg/dL — ABNORMAL HIGH (ref 65–99)
Potassium: 3.5 mmol/L (ref 3.5–5.1)
Sodium: 127 mmol/L — ABNORMAL LOW (ref 135–145)
Total Bilirubin: 0.8 mg/dL (ref 0.3–1.2)
Total Protein: 6.7 g/dL (ref 6.5–8.1)

## 2017-01-31 LAB — URINALYSIS, ROUTINE W REFLEX MICROSCOPIC
Bilirubin Urine: NEGATIVE
Glucose, UA: NEGATIVE mg/dL
Hgb urine dipstick: NEGATIVE
Ketones, ur: NEGATIVE mg/dL
Leukocytes, UA: NEGATIVE
Nitrite: NEGATIVE
Protein, ur: NEGATIVE mg/dL
Specific Gravity, Urine: 1.004 — ABNORMAL LOW (ref 1.005–1.030)
pH: 6 (ref 5.0–8.0)

## 2017-01-31 LAB — CBC WITH DIFFERENTIAL/PLATELET
Basophils Absolute: 0 10*3/uL (ref 0.0–0.1)
Basophils Relative: 0 %
Eosinophils Absolute: 0 10*3/uL (ref 0.0–0.7)
Eosinophils Relative: 0 %
HCT: 34.1 % — ABNORMAL LOW (ref 36.0–46.0)
Hemoglobin: 11.6 g/dL — ABNORMAL LOW (ref 12.0–15.0)
Lymphocytes Relative: 9 %
Lymphs Abs: 0.9 10*3/uL (ref 0.7–4.0)
MCH: 32 pg (ref 26.0–34.0)
MCHC: 34 g/dL (ref 30.0–36.0)
MCV: 93.9 fL (ref 78.0–100.0)
Monocytes Absolute: 0.7 10*3/uL (ref 0.1–1.0)
Monocytes Relative: 7 %
Neutro Abs: 7.8 10*3/uL — ABNORMAL HIGH (ref 1.7–7.7)
Neutrophils Relative %: 84 %
Platelets: 250 10*3/uL (ref 150–400)
RBC: 3.63 MIL/uL — ABNORMAL LOW (ref 3.87–5.11)
RDW: 12.5 % (ref 11.5–15.5)
WBC: 9.4 10*3/uL (ref 4.0–10.5)

## 2017-01-31 LAB — OSMOLALITY, URINE: Osmolality, Ur: 206 mOsm/kg — ABNORMAL LOW (ref 300–900)

## 2017-01-31 LAB — OSMOLALITY: Osmolality: 269 mOsm/kg — ABNORMAL LOW (ref 275–295)

## 2017-01-31 LAB — SODIUM, URINE, RANDOM: Sodium, Ur: 48 mmol/L

## 2017-01-31 LAB — POC OCCULT BLOOD, ED: Fecal Occult Bld: NEGATIVE

## 2017-01-31 NOTE — ED Notes (Signed)
Patient ambulating to restroom with aid of family member.  Will update vitals when finished.

## 2017-01-31 NOTE — Discharge Instructions (Signed)
Please schedule an appointment to follow up with your primary care provider regarding your visit today. Return to the ER for shortness of breath, chest pain, or new or concerning symptoms.

## 2017-01-31 NOTE — ED Provider Notes (Signed)
Care assumed from Ina Homes, MD, pending CT chest results and urine lab results. See his note for full HPI and workup. Briefly, patient presenting with hemoptysis s/p mechanical fall and right scapular fracture, patient on eliquis for hx of PE and afib. Hemoccult negative today. VSS. Sodium 127, though patient asymptomatic. Plan for anticipated outpatient management and PCP follow up regarding hyponatremia. CT chest pending to evaluate for possible pulmonary contusion as cause of hemoptysis.   Physical Exam  BP (!) 177/78   Pulse (!) 57   Temp 98.5 F (36.9 C) (Oral)   Resp 18   Ht 5' 1"  (1.549 m)   Wt 71.2 kg (157 lb)   SpO2 98%   BMI 29.66 kg/m   Physical Exam  Constitutional: She appears well-developed and well-nourished.  HENT:  Head: Normocephalic and atraumatic.  Eyes: Conjunctivae are normal.  Pulmonary/Chest: Effort normal.  Abdominal: Soft.  Neurological: She is alert.  Skin: Skin is warm.  Psychiatric: She has a normal mood and affect. Her behavior is normal.  Nursing note and vitals reviewed.   ED Course/Procedures     Procedures  MDM  CT chest without evidence of pulmonary contusion or PE. Discussed incidental findings of nodules, patient is already aware of these. Pt also already aware of gallstones and prior L1 fracture.   Pt to follow up with PCP regarding visit today. Symptomatic management discussed. Strict return precautions discussed. Safe for discharge.  Discussed results, findings, treatment and follow up. Patient advised of return precautions. Patient verbalized understanding and agreed with plan.      Jaze Rodino, [place] N, PA-C 01/31/17 1648    Isla Pence, MD 02/01/17 1024

## 2017-01-31 NOTE — ED Notes (Signed)
Pt returned from CT °

## 2017-01-31 NOTE — ED Notes (Signed)
Patient transported to X-ray 

## 2017-01-31 NOTE — ED Provider Notes (Signed)
[facility] EMERGENCY DEPARTMENT Provider Note   CSN: [id number] Arrival date & time: 01/31/17  1010  History   Chief Complaint Chief Complaint  Patient presents with  . Hemoptysis   HPI Sara Russell is a 83 y.o. female with A-fib and PE on Amiodarone and Eliquis who presented to the ED with concerns of hematemesis vs hemoptysis. She presented on 12/18 after a mechanical fall. At the time imaging revealed a non-displaced fracture across the spine of the scapula at the base of the acromion. She was placed in the a sling for immobilization. The following day she went out for dinner and had seafood (she is allergic to scallops) and that night experienced some emesis vs hemoptysis with brown discoloration. She denies other signs of anaphylaxis. She then felt fine and did not think much of the event. Approximately 24 hours later she experienced another episode of emesis vs hemotysis with brown discoloration. She has never had episodes like this before but is concerned she may be bleeding since she is on Eliquis. She has never had a GI bleed before, does not use NSAIDs, does not drink excessive amounts of EtOH, no longer smokes tobacco, has not been experiencing tarry stools, and does not have any abdominal pain. She does have a history of ulcerative colitis. She also endorses dizziness with standing that has progressed since Tuesday. She otherwise has been feeling well and denies headaches, visual changes, CP, cough, N/V, abdominal pain, diarrhea, rash, new myalgias or arthralgias, dysuria, polyuria.  Past Medical History:  Diagnosis Date  . Abdominal mass   . AKI (acute kidney injury) ([place]) 09/15/2014  . Atrial fibrillation ([place])   . Colitis, ulcerative ([place])   . Cough    WHITE SPUTUM FOR 2 WEEKS, NO FEVER  . Degenerative joint disease   . Diverticulosis   . DVT (deep venous thrombosis) ([place]) 2009   BOTH LUNGS  . GERD (gastroesophageal reflux disease)   . Hyperlipidemia    . Hyperplastic colon polyp   . Hypertension   . Hypothyroidism   . Iron deficiency anemia    ON IRON AT TIMES  . PE (pulmonary embolism) 2009  . Persistent atrial fibrillation ([place])   . Positive H. pylori test 01/20/96  . S/P dilatation of esophageal stricture 1992  . Skin cancer    h/o basal and squamous skin cancer--> removed   Patient Active Problem List   Diagnosis Date Noted  . Solitary pulmonary nodule 01/29/2017  . Acute pain of right shoulder 01/28/2017  . Hypothyroidism 04/04/2015  . Compression fracture of L1 lumbar vertebra ([facility]) 02/23/2015  . Choledocholithiasis 02/23/2015  . Encounter for palliative care   . Chronic diastolic congestive heart failure ([place])   . Osteoporosis 05/13/2014  . Gout 08/16/2013  . Left atrial enlargement 01/10/2013  . A-fib ([place]) 12/08/2012  . Rosacea 05/15/2012  . Anemia due to chronic blood loss 08/28/2010  . DEGENERATIVE DISC DISEASE, LUMBAR SPINE 07/14/2009  . GERD 07/11/2009  . PERSONAL HX COLONIC POLYPS 07/11/2009  . HYPERCHOLESTEROLEMIA 04/10/2006  . HYPERTENSION, BENIGN SYSTEMIC 04/10/2006  . History of pulmonary embolism 04/10/2006  . DIVERTICULOSIS OF COLON 04/10/2006  . ACTINIC KERATOSIS 04/10/2006  . GEN OSTEOARTHROSIS INVOLVING MULTIPLE SITES 04/10/2006   Past Surgical History:  Procedure Laterality Date  . APPENDECTOMY    . BREAST EXCISIONAL BIOPSY    . BREAST LUMPECTOMY     benign  . DILATION AND CURETTAGE OF UTERUS     for endometrial polyps  . ENDOSCOPIC  RETROGRADE CHOLANGIOPANCREATOGRAPHY (ERCP) WITH PROPOFOL N/A 03/23/2015   Procedure: ENDOSCOPIC RETROGRADE CHOLANGIOPANCREATOGRAPHY (ERCP) WITH PROPOFOL;  Surgeon: Milus Banister, MD;  Location: [facility] ENDOSCOPY;  Service: Endoscopy;  Laterality: N/A;  . JOINT REPLACEMENT    . ORIF ANKLE FRACTURE Left    X 2  . REPLACEMENT TOTAL KNEE     left  . SURGERY DONE TO REMOVE LEFT ANKLE HARDWARE LATER    . TONSILLECTOMY     OB History    No data available     Home  Medications    Prior to Admission medications   Medication Sig Start Date End Date Taking? Authorizing Provider  acetaminophen (TYLENOL ARTHRITIS PAIN) 650 MG CR tablet Take 650 mg by mouth daily.   Yes [provider]  amiodarone (PACERONE) 100 MG tablet Take 1 tablet (100 mg total) by mouth daily. 10/03/16  Yes Hensel, Jamal Collin, MD  apixaban (ELIQUIS) 2.5 MG TABS tablet Take 1 tablet (2.5 mg total) by mouth 2 (two) times daily. 10/03/16  Yes Hensel, Jamal Collin, MD  atorvastatin (LIPITOR) 10 MG tablet Take 1 tablet (10 mg total) by mouth daily. 10/03/16  Yes Hensel, Jamal Collin, MD  carvedilol (COREG) 12.5 MG tablet Take 1 tablet (12.5 mg total) by mouth 2 (two) times daily with a meal. 10/03/16  Yes Hensel, Jamal Collin, MD  furosemide (LASIX) 20 MG tablet Take 1 tablet (20 mg total) by mouth every other day. 10/03/16  Yes Hensel, Jamal Collin, MD  Lactobacillus Rhamnosus, GG, (CULTURELLE PO) Take 1 Dose by mouth daily.   Yes [provider]  levothyroxine (SYNTHROID, LEVOTHROID) 75 MCG tablet Take 1 tablet (75 mcg total) by mouth daily. 10/03/16  Yes Hensel, Jamal Collin, MD  omeprazole (PRILOSEC) 40 MG capsule Take 1 capsule (40 mg total) by mouth daily. 10/03/16  Yes Hensel, Jamal Collin, MD  Simethicone (GAS RELIEF PO) Take 1 tablet by mouth 2 (two) times daily.   Yes [provider]  traMADol (ULTRAM) 50 MG tablet Take 1 tablet (50 mg total) by mouth every 8 (eight) hours as needed. Patient taking differently: Take 50 mg by mouth every 8 (eight) hours as needed for moderate pain.  01/27/17  Yes Alveda Reasons, MD  diphenhydramine-acetaminophen (TYLENOL PM) 25-500 MG TABS tablet Take 1 tablet by mouth at bedtime as needed.    [provider]   Family History Family History  Problem Relation Age of Onset  . Esophageal cancer Brother   . Diverticulitis Brother   . Heart disease Mother   . Stroke Mother   . Dementia Mother   . Heart attack Mother   . Heart disease  Father   . Atrial fibrillation Father   . Breast cancer Maternal Aunt   . Colon cancer Neg Hx    Social History Social History   Tobacco Use  . Smoking status: Former Smoker    Packs/day: 0.50    Years: 13.00    Pack years: 6.50    Types: Cigarettes    Start date: 02/12/1948    Last attempt to quit: 02/11/1961    Years since quitting: 56.0  . Smokeless tobacco: Never Used  . Tobacco comment: Denies use of tobacco since 1963  Substance Use Topics  . Alcohol use: Yes    Alcohol/week: 3.0 oz    Types: 5 Glasses of wine per week    Comment: WINE FEW TIMES PER WEEK  . Drug use: No   Allergies   Iohexol; Ivp dye [iodinated diagnostic  agents]; Scallops [shellfish allergy]; Sulfa antibiotics; and Sulfamethoxazole  Review of Systems All systems reviewed and are negative for acute change except as noted in the HPI.  Physical Exam Updated Vital Signs BP (!) 165/72   Pulse (!) 55   Temp 98.5 F (36.9 C) (Oral)   Resp 18   Ht 5' 1"  (1.549 m)   Wt 71.2 kg (157 lb)   SpO2 98%   BMI 29.66 kg/m   General: Well nourished female in no acute distress HENT: Normocephalic, nares are clear with no bloody discharge, oropharynx is clear with no erythema or exudates Pulm: Good air movement with no wheezing or crackles  CV: Irregular rhythm, no murmurs, no rubs  Abdomen: Active bowel sounds, soft, non-distended, no tenderness to palpation  Extremities: No LE edema, radial pulses palpable bilaterally  Skin: Warm and dry  Neuro: Alert and oriented x 3  ED Treatments / Results  Labs (all labs ordered are listed, but only abnormal results are displayed) Labs Reviewed  COMPREHENSIVE METABOLIC PANEL - Abnormal; Notable for the following components:      Result Value   Sodium 127 (*)    Chloride 95 (*)    Glucose, Bld 110 (*)    Creatinine, Ser 1.08 (*)    Calcium 8.5 (*)    GFR calc non Af Amer 46 (*)    GFR calc Af Amer 53 (*)    All other components within normal limits  CBC WITH  DIFFERENTIAL/PLATELET - Abnormal; Notable for the following components:   RBC 3.63 (*)    Hemoglobin 11.6 (*)    HCT 34.1 (*)    Neutro Abs 7.8 (*)    All other components within normal limits  OSMOLALITY - Abnormal; Notable for the following components:   Osmolality 269 (*)    All other components within normal limits  URINALYSIS, ROUTINE W REFLEX MICROSCOPIC  SODIUM, URINE, RANDOM  OSMOLALITY, URINE  POC OCCULT BLOOD, ED   EKG  EKG Interpretation None      Radiology Dg Chest 2 View  Result Date: 01/31/2017 CLINICAL DATA:  Hemoptysis. EXAM: CHEST  2 VIEW COMPARISON:  Radiograph of September 21, 2014. FINDINGS: Stable cardiomegaly. Atherosclerosis of thoracic aorta is noted. No pneumothorax is noted. Stable large hiatal hernia is noted. No pneumothorax or significant pleural effusion is noted. Elevated right hemidiaphragm is noted. Bony thorax is unremarkable. IMPRESSION: Aortic atherosclerosis. Stable large hiatal hernia. No other abnormality seen in the chest. Electronically Signed   By: Marijo Conception, M.D.   On: 01/31/2017 13:53   Procedures Procedures (including critical care time)  Medications Ordered in ED Medications - No data to display  Initial Impression / Assessment and Plan / ED Course  I have reviewed the triage vital signs and the nursing notes.  Pertinent labs & imaging results that were available during my care of the patient were reviewed by me and considered in my medical decision making (see chart for details).    83 y.o female with A-fib and PE on Amiodarone and Eliquis who presented to the ED with concerns of hematemesis vs hemoptysis. The patient is a retired [employer] scientist and feels that her presentation is most consistent with hemoptysis. Her hemoglobin is consistent with prior. She is orthostatic negative. Her fecal occult is negative. CXR unremarkable. Based on available clinical information the patient's presentation is unlikely to be secondary to infection or  GI bleed. Obtaining CT chest to rule out pulmonary contusion.   Hyponatremia. Sodium 127, glucose 110,  serum protein 6.7. Based on chart review the patient's sodium is typically between 133-135. She is asymptomatic. Patient appears euvolemic on PE. Vital signs are stable and she is not orthostatic. Serum osmolality low at 269 indicating hypotonic hyponatremia. Checking urine sodium, and urine osmolality. Patient is on Lasix but no other medications to explain her hyponatremia. She does have a history of hypothyroidism, if urine studies are indicative, will check TSH.   Hypertensive Urgency. Patient states that she takes her BP medicine as prescribed. She is only on Carvedilol. She currently does not have any signs of end-organ damage. She was previously on Amlodipine but taken off due to LE swelling. She will need to follow with her PCP to discuss further BP control. I would avoid HCTZ or chlorthalidone due to the patient's hyponatremia. She would benefit from Losartan given her cocurrent gout diagnosis.    Final Clinical Impressions(s) / ED Diagnoses   Final diagnoses:  Hemoptysis   ED Discharge Orders    None       Ina Homes, MD 01/31/17 1523    Isla Pence, MD 02/01/17 0930

## 2017-01-31 NOTE — ED Triage Notes (Signed)
Pt states she fell on Wednesday striking her head, she was going to have an XR on her shoulder which is broken, sling in place. Pt was seen here for the fall and had a CT of the head which negative. Pt states she has had two episodes of spitting up blood which is why she came in today.

## 2017-01-31 NOTE — ED Notes (Signed)
Patient transported to CT 

## 2017-02-10 ENCOUNTER — Ambulatory Visit (INDEPENDENT_AMBULATORY_CARE_PROVIDER_SITE_OTHER): Payer: Medicare Other | Admitting: Sports Medicine

## 2017-02-10 ENCOUNTER — Telehealth: Payer: Self-pay

## 2017-02-10 ENCOUNTER — Ambulatory Visit
Admission: RE | Admit: 2017-02-10 | Discharge: 2017-02-10 | Disposition: A | Discharge status: Home / Self Care | Payer: Medicare Other | Source: Ambulatory Visit | Attending: Sports Medicine | Admitting: Sports Medicine

## 2017-02-10 VITALS — BP 184/78 | Ht 61.0 in | Wt 157.0 lb

## 2017-02-10 DIAGNOSIS — S42115D Nondisplaced fracture of body of scapula, left shoulder, subsequent encounter for fracture with routine healing: Secondary | ICD-10-CM | POA: Diagnosis not present

## 2017-02-10 DIAGNOSIS — M25511 Pain in right shoulder: Secondary | ICD-10-CM

## 2017-02-10 DIAGNOSIS — S42101A Fracture of unspecified part of scapula, right shoulder, initial encounter for closed fracture: Secondary | ICD-10-CM | POA: Diagnosis not present

## 2017-02-10 NOTE — Progress Notes (Signed)
   Subjective:    Patient ID: Sara Russell, female    DOB: 1930/06/29, 83 y.o.   MRN: [id number]  HPI   Sara Russell comes in today for follow-up on a scapular fracture of her right shoulder which occurred on 01/28/2017. Her pain is improving. She has been wearing her sling except for sleeping. She has noticed some shoulder stiffness. She is here today with her son.   Review of Systems    as above Objective:   Physical Exam  Well-developed, well-nourished. No acute distress. Sitting comfortably in the exam room  Right shoulder: Patient has limited range of motion in all planes but she is able to achieve 80 of forward flexion and 70 of abduction before experiencing pain. She does have some tenderness to palpation over the lateral scapula at the fracture site. No swelling.She is neurovascularly intact distally.  X-rays of the right scapula are obtained. Fracture appears to be stable.      Assessment & Plan:   2 weeks status post minimally displaced scapular fracture, right shoulder Osteoporosis  Patient will wean to using her sling for comfort only. The longer we immobilize her the stiffer her shoulder will become and the greater her risk of falling will be. She will start some pendulum exercises and follow-up with me in 3 weeks for reevaluation and repeat x-rays. She and her son are both encouraged to call with questions or concerns prior to that follow-up visit.

## 2017-02-12 ENCOUNTER — Encounter: Payer: Self-pay | Admitting: Family Medicine

## 2017-02-12 ENCOUNTER — Other Ambulatory Visit: Payer: Self-pay

## 2017-02-12 ENCOUNTER — Ambulatory Visit (INDEPENDENT_AMBULATORY_CARE_PROVIDER_SITE_OTHER): Payer: Medicare Other | Admitting: Family Medicine

## 2017-02-12 DIAGNOSIS — G4700 Insomnia, unspecified: Secondary | ICD-10-CM | POA: Diagnosis not present

## 2017-02-12 DIAGNOSIS — I5032 Chronic diastolic (congestive) heart failure: Secondary | ICD-10-CM | POA: Diagnosis not present

## 2017-02-12 DIAGNOSIS — R918 Other nonspecific abnormal finding of lung field: Secondary | ICD-10-CM | POA: Diagnosis not present

## 2017-02-12 DIAGNOSIS — E871 Hypo-osmolality and hyponatremia: Secondary | ICD-10-CM

## 2017-02-12 DIAGNOSIS — K8020 Calculus of gallbladder without cholecystitis without obstruction: Secondary | ICD-10-CM

## 2017-02-12 DIAGNOSIS — I10 Essential (primary) hypertension: Secondary | ICD-10-CM | POA: Diagnosis not present

## 2017-02-12 DIAGNOSIS — Z9181 History of falling: Secondary | ICD-10-CM

## 2017-02-12 IMAGING — US US RENAL
1 series · 14 of 25 positions shown · non-contrast
Comparison: 09/18/2014

CLINICAL DATA: Acute renal failure

EXAM:
RENAL / URINARY TRACT ULTRASOUND COMPLETE

[Series 1: us renal · 0.25mm/px · 14 of 40 slices shown]
[im 1/40]
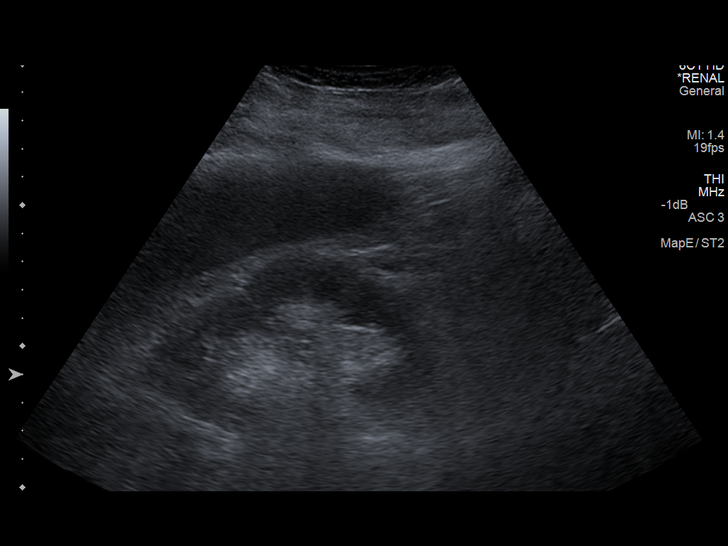
[im 4/40]
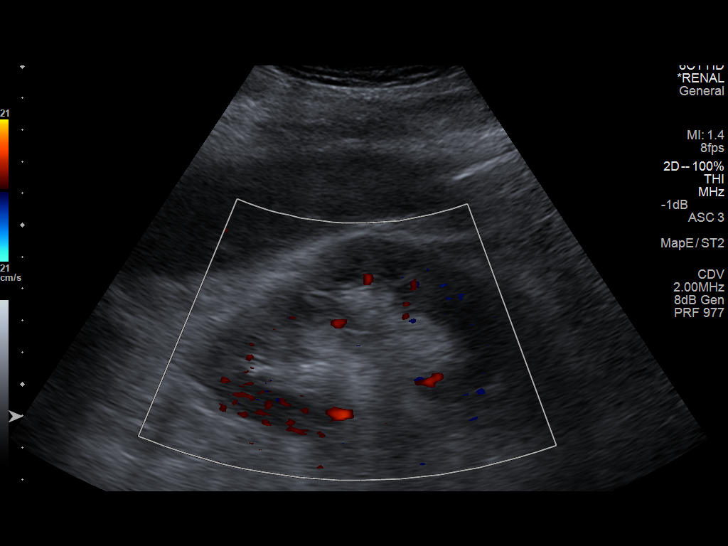
[im 7/40]
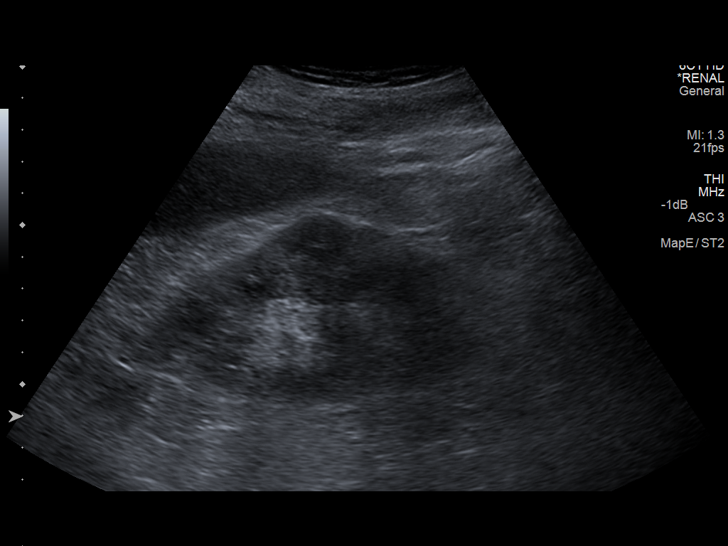
[im 10/40]
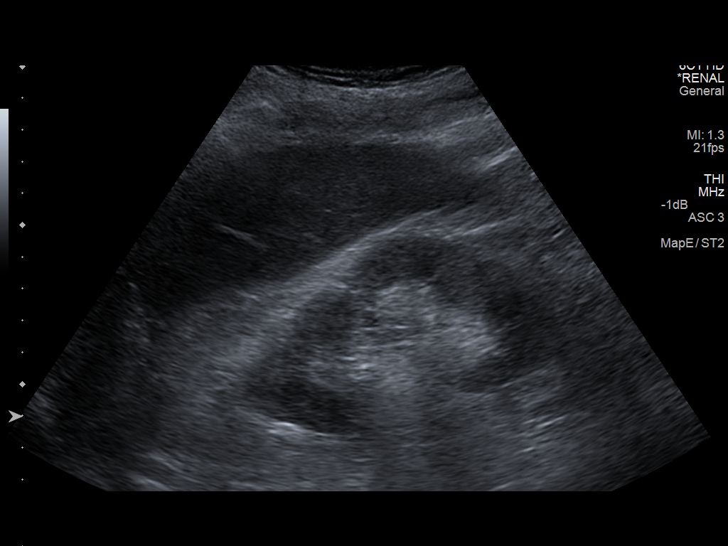
[im 14/40]
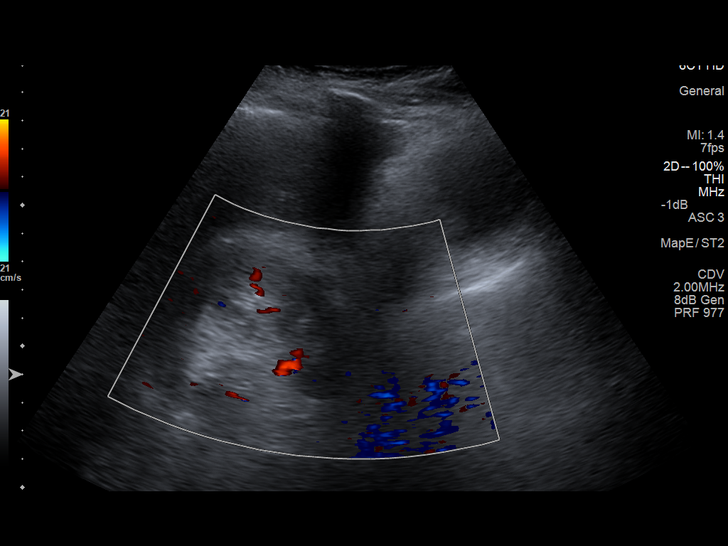
[im 15/40]
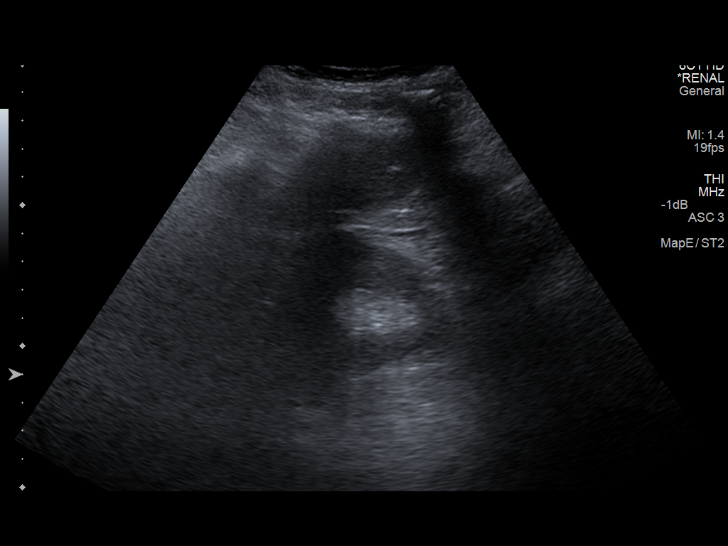
[im 18/40]
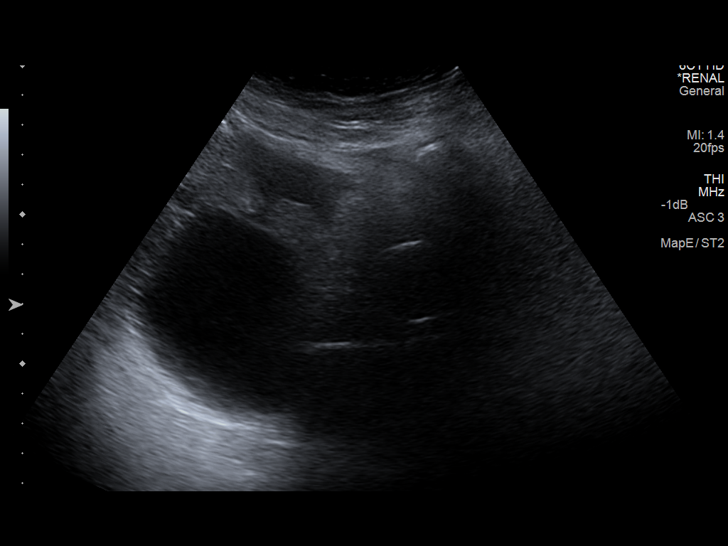
[im 22/40]
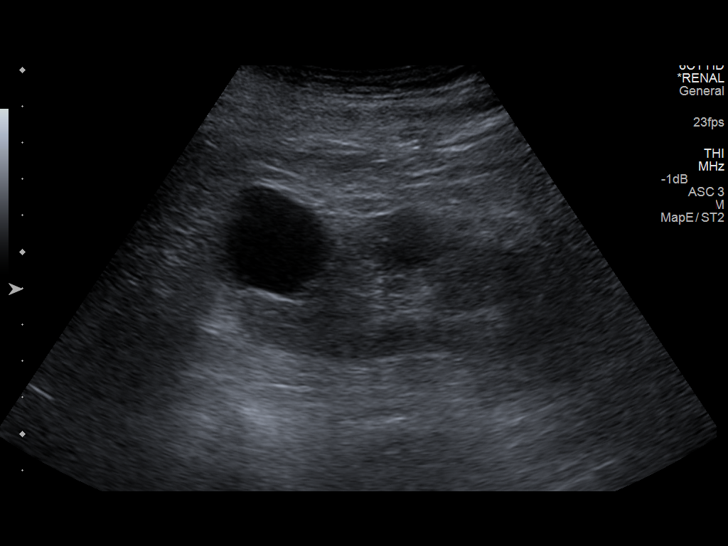
[im 25/40]
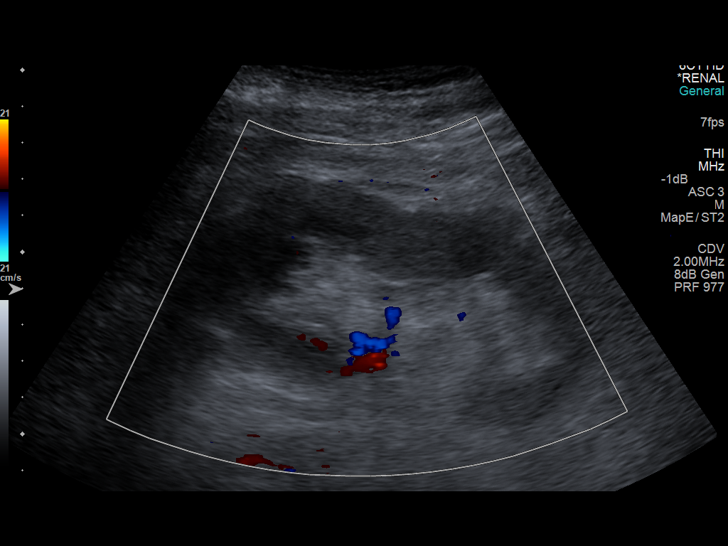
[im 27/40]
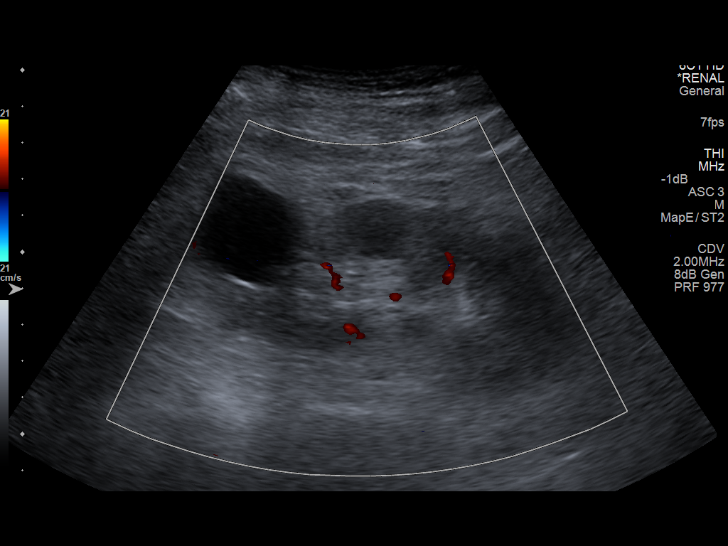
[im 30/40]
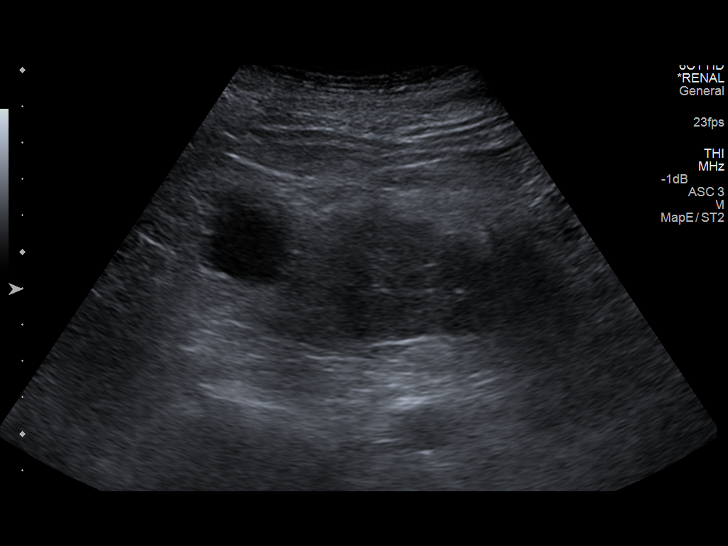
[im 33/40]
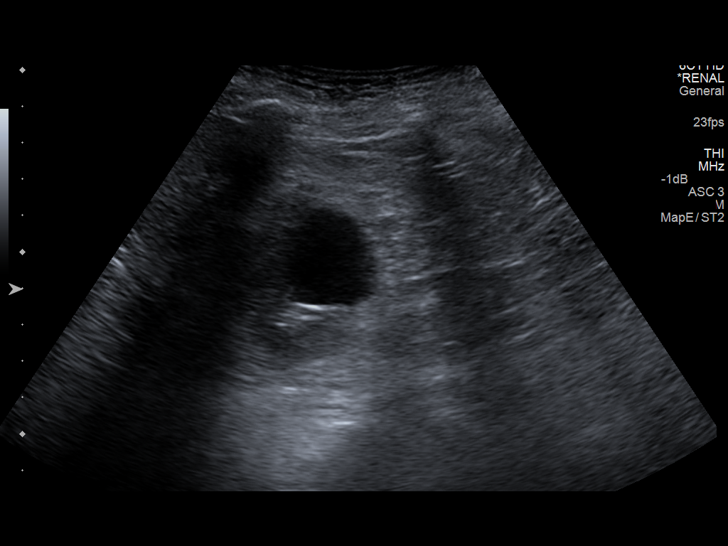
[im 36/40]
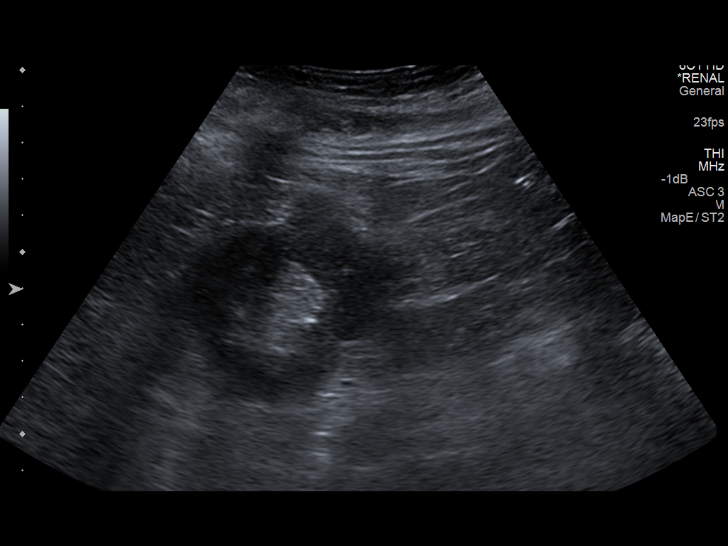
[im 40/40]
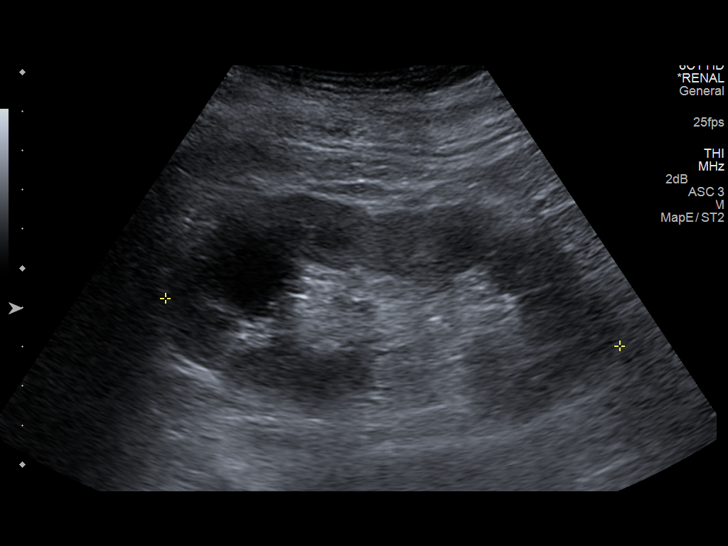

[14 of 25 positions shown; findings below may reference images not displayed]

FINDINGS: Right Kidney:

Length: 10.8 cm.. Echogenicity within normal limits. No mass or
hydronephrosis visualized.

Left Kidney:

Length: 11.6 cm.. A 2.9 cm cyst is noted in the upper pole similar
to that seen on prior CT examination.

Bladder:

Decompressed by Foley catheter. Note is made again of a left adnexal
cyst.
IMPRESSION: Left renal cyst.

No other focal renal abnormality is noted.

## 2017-02-12 MED ORDER — TRAZODONE HCL 50 MG PO TABS: 25.0000 mg | ORAL_TABLET | Freq: Every evening | ORAL | 3 refills | Status: DC | PRN

## 2017-02-12 NOTE — Telephone Encounter (Signed)
See phone note

## 2017-02-12 NOTE — Patient Instructions (Addendum)
Stop taking the lasix/furosemide regularly.  Only take it if swelling.  Do not take it if you are lightheaded or not eating much. Maybe we will want to do a bone density on you once your shoulder heals up. If we ever decide your gall bladder is causing symptoms, I may have you talk to the surgeon.   Don't take the tylenol PM.  It increases your risk for falls.   I sent in a new prescription for something mild for sleep.

## 2017-02-13 ENCOUNTER — Encounter: Payer: Self-pay | Admitting: Family Medicine

## 2017-02-13 LAB — BASIC METABOLIC PANEL
BUN/Creatinine Ratio: 11 — ABNORMAL LOW (ref 12–28)
BUN: 14 mg/dL (ref 8–27)
CO2: 23 mmol/L (ref 20–29)
Calcium: 9.1 mg/dL (ref 8.7–10.3)
Chloride: 96 mmol/L (ref 96–106)
Creatinine, Ser: 1.22 mg/dL — ABNORMAL HIGH (ref 0.57–1.00)
GFR calc Af Amer: 47 mL/min/{1.73_m2} — ABNORMAL LOW (ref >59)
GFR calc non Af Amer: 41 mL/min/{1.73_m2} — ABNORMAL LOW (ref >59)
Glucose: 103 mg/dL — ABNORMAL HIGH (ref 65–99)
Potassium: 3.8 mmol/L (ref 3.5–5.2)
Sodium: 135 mmol/L (ref 134–144)

## 2017-02-13 MED ORDER — FUROSEMIDE 20 MG PO TABS: 20.0000 mg | ORAL_TABLET | ORAL | 3 refills | Status: DC | PRN

## 2017-02-13 NOTE — Assessment & Plan Note (Signed)
Lasix likely worsens.  Change lasix to only prn.
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
## 2017-02-13 NOTE — Assessment & Plan Note (Signed)
No surgery unless becomes symptomatic.

## 2017-02-13 NOTE — Assessment & Plan Note (Signed)
Well controled.  Avoid over treatment.

## 2017-02-13 NOTE — Assessment & Plan Note (Signed)
Concerned that overdiuresis may contribute to fall and/or hyponatremia.  Lasix will only be prn.

## 2017-02-13 NOTE — Assessment & Plan Note (Signed)
Physical therapy.

## 2017-02-21 ENCOUNTER — Telehealth: Payer: Self-pay | Admitting: *Deleted

## 2017-02-21 DIAGNOSIS — H18413 Arcus senilis, bilateral: Secondary | ICD-10-CM | POA: Diagnosis not present

## 2017-02-21 DIAGNOSIS — H2511 Age-related nuclear cataract, right eye: Secondary | ICD-10-CM | POA: Diagnosis not present

## 2017-02-21 DIAGNOSIS — H25043 Posterior subcapsular polar age-related cataract, bilateral: Secondary | ICD-10-CM | POA: Diagnosis not present

## 2017-02-21 DIAGNOSIS — H43823 Vitreomacular adhesion, bilateral: Secondary | ICD-10-CM | POA: Diagnosis not present

## 2017-02-21 DIAGNOSIS — H25013 Cortical age-related cataract, bilateral: Secondary | ICD-10-CM | POA: Diagnosis not present

## 2017-02-21 DIAGNOSIS — H2513 Age-related nuclear cataract, bilateral: Secondary | ICD-10-CM | POA: Diagnosis not present

## 2017-02-21 NOTE — Telephone Encounter (Signed)
Pt states that Dr. Talbert Forest was is doing her cataract surgery on 03/31/17 and does not "believe" in stopping eliquis.  She would like your opinion. Aijalon Demuro, Salome Spotted, CMA

## 2017-02-25 NOTE — Telephone Encounter (Signed)
Called and discussed.  Two options: 1. Best option: call and speak directly with surgeon. 2. Fallback option.  I would stop eliquis 24 hours before and after surgery. She states she will discuss with surgeon.

## 2017-03-03 ENCOUNTER — Encounter: Payer: Self-pay | Admitting: Sports Medicine

## 2017-03-03 ENCOUNTER — Other Ambulatory Visit: Payer: Self-pay | Admitting: *Deleted

## 2017-03-03 ENCOUNTER — Ambulatory Visit (INDEPENDENT_AMBULATORY_CARE_PROVIDER_SITE_OTHER): Payer: Medicare Other | Admitting: Sports Medicine

## 2017-03-03 VITALS — BP 182/69 | HR 63 | Ht 61.0 in | Wt 157.0 lb

## 2017-03-03 DIAGNOSIS — S42115D Nondisplaced fracture of body of scapula, left shoulder, subsequent encounter for fracture with routine healing: Secondary | ICD-10-CM | POA: Diagnosis present

## 2017-03-03 DIAGNOSIS — M25511 Pain in right shoulder: Secondary | ICD-10-CM

## 2017-03-03 NOTE — Progress Notes (Signed)
   Subjective:    Patient ID: Sara Russell, female    DOB: 11/16/1930, 84 y.o.   MRN: [id number]  HPI   Sara Russell comes in today for follow-up on a right scapular fracture. Overall, she's doing pretty well. She still struggles with reaching directly overhead but otherwise is relatively pain-free. She is no longer using her sling. She is able to ambulate pretty easily with the assistance of a walker.   Review of Systems    as above Objective:   Physical Exam  Well-developed, well-nourished. No acute distress.  Right shoulder: Patient still slightly tender to palpation over the fracture site. She has good forward flexion, extension, and abduction to 90 but has pain past this point. Slight weakness as well. Neurovascularly intact distally.      Assessment & Plan:   5 weeks status post nondisplaced scapular fracture, right shoulder  I need to get an updated x-ray. Patient will continue with range of motion exercises. At the time of her initial office visit with me, there was suspicion that she had some rotator cuff disease which was causing her shoulder pain even prior to her fracture. Once her fracture is healed we will reevaluate this. Follow-up with me again in 4 weeks for reevaluation and repeat x-rays. Call with questions or concerns in the interim.

## 2017-03-03 NOTE — Patient Instructions (Signed)
  Be sure to get your x-ray of your shoulder some time this week  I will see you again on February 25.

## 2017-03-04 ENCOUNTER — Ambulatory Visit
Admission: RE | Admit: 2017-03-04 | Discharge: 2017-03-04 | Disposition: A | Discharge status: Home / Self Care | Payer: Medicare Other | Source: Ambulatory Visit | Attending: Sports Medicine | Admitting: Sports Medicine

## 2017-03-04 DIAGNOSIS — M25511 Pain in right shoulder: Secondary | ICD-10-CM

## 2017-03-04 DIAGNOSIS — S42101A Fracture of unspecified part of scapula, right shoulder, initial encounter for closed fracture: Secondary | ICD-10-CM | POA: Diagnosis not present

## 2017-03-05 ENCOUNTER — Telehealth: Payer: Self-pay | Admitting: Sports Medicine

## 2017-03-05 NOTE — Telephone Encounter (Signed)
  X-ray of her right scapula shows interval healing of her scapular fracture as well as significant glenohumeral DJD. Patient will be notified of these findings and will follow-up with me in 4 weeks for repeat x-ray.

## 2017-03-31 ENCOUNTER — Telehealth: Payer: Self-pay | Admitting: Student in an Organized Health Care Education/Training Program

## 2017-03-31 DIAGNOSIS — H2511 Age-related nuclear cataract, right eye: Secondary | ICD-10-CM | POA: Diagnosis not present

## 2017-03-31 NOTE — Telephone Encounter (Signed)
**  After Hours/ Emergency Line Call*  Received a call to report that Sara Russell is at [facility]. They request a verbal order from patient's PCP that she be placed in a respite room for 24H for observation after cataract surgery. They also request a verbal order to allow patient to self-administer medications in the morning, and report that she passed a self-administration test.   Clarified with facility that they do usually go through PCP for these types of orders and not through a covering physician at their facility. Verbal orders given.  Everrett Coombe, MD PGY-2, [facility]

## 2017-04-01 DIAGNOSIS — H2512 Age-related nuclear cataract, left eye: Secondary | ICD-10-CM | POA: Diagnosis not present

## 2017-04-04 ENCOUNTER — Ambulatory Visit
Admission: RE | Admit: 2017-04-04 | Discharge: 2017-04-04 | Disposition: A | Discharge status: Home / Self Care | Payer: Medicare Other | Source: Ambulatory Visit | Attending: Sports Medicine | Admitting: Sports Medicine

## 2017-04-04 DIAGNOSIS — S42101D Fracture of unspecified part of scapula, right shoulder, subsequent encounter for fracture with routine healing: Secondary | ICD-10-CM | POA: Diagnosis not present

## 2017-04-04 DIAGNOSIS — M25511 Pain in right shoulder: Secondary | ICD-10-CM

## 2017-04-07 ENCOUNTER — Ambulatory Visit: Payer: Medicare Other | Admitting: Sports Medicine

## 2017-04-08 ENCOUNTER — Ambulatory Visit (INDEPENDENT_AMBULATORY_CARE_PROVIDER_SITE_OTHER): Payer: Medicare Other | Admitting: Sports Medicine

## 2017-04-08 ENCOUNTER — Encounter: Payer: Self-pay | Admitting: Sports Medicine

## 2017-04-08 VITALS — BP 149/64 | Ht 60.0 in | Wt 155.0 lb

## 2017-04-08 DIAGNOSIS — M25511 Pain in right shoulder: Secondary | ICD-10-CM

## 2017-04-08 DIAGNOSIS — S42115D Nondisplaced fracture of body of scapula, left shoulder, subsequent encounter for fracture with routine healing: Secondary | ICD-10-CM | POA: Diagnosis not present

## 2017-04-08 NOTE — Progress Notes (Signed)
   Subjective:    Patient ID: Sara Russell, female    DOB: Jun 13, 1930, 84 y.o.   MRN: [id number]  HPI   [name] comes in today for follow-up on a minimally displaced scapular fracture of the right shoulder. She continues to improve. Fracture occurred a little over 2 months ago. She is having less pain and increasing her range of motion. Overall, doing very well. She had cataract surgery earlier this week so has not been able to do her home exercises.   Review of Systems    as above Objective:   Physical Exam  Well-developed, well-nourished. No acute distress. Sitting comfortable in exam room  Right shoulder: Patient has improved range of motion in all planes. She is able to reach directly over her head. She has full internal rotation. A little difficulty with external rotation. 4+5 strength with supraspinatus testing. Slight tenderness to palpation over the fracture site. No swelling. Neurovascularly intact distally.  X-rays of the right shoulder show continued healing of her scapular spine. She has abundant callus formation around the fracture line.      Assessment & Plan:   2 months status post minimally displaced scapular fracture, right shoulder- healing Chronic right shoulder rotator cuff tendinopathy/atrophy  Patient will continue to increase activity as tolerated in regards to her right shoulder. I would like to get one final follow-up x-ray in 4 weeks. I'm still not sure how much of her shoulder pain was due to her fracture versus her rotator cuff pathology. She certainly has weakness with rotator cuff stressing but minimal pain today. Follow-up with me in 4 weeks. If she continues to improve then I will likely not pursue any other treatment. However, if her shoulder pain worsens, once her fracture is healed, then we could consider a cortisone injection for pain control. Call with questions or concerns in the interim.

## 2017-04-08 NOTE — Addendum Note (Signed)
Addended by: Cyd Silence on: 04/08/2017 10:14 AM   Modules accepted: Orders

## 2017-04-14 ENCOUNTER — Other Ambulatory Visit: Payer: Self-pay | Admitting: Family Medicine

## 2017-04-14 DIAGNOSIS — Z1231 Encounter for screening mammogram for malignant neoplasm of breast: Secondary | ICD-10-CM

## 2017-04-21 ENCOUNTER — Telehealth: Payer: Self-pay

## 2017-04-21 DIAGNOSIS — H2512 Age-related nuclear cataract, left eye: Secondary | ICD-10-CM | POA: Diagnosis not present

## 2017-04-21 NOTE — Telephone Encounter (Signed)
Equities trader at [employer] ok for patient to receive overnight care following cataract surgery. Call back [phone] Wallace Cullens, RN

## 2017-04-22 NOTE — Telephone Encounter (Signed)
Spoke with nurse.  Due to changed circumstance, no order needed at this time.

## 2017-05-01 ENCOUNTER — Ambulatory Visit
Admission: RE | Admit: 2017-05-01 | Discharge: 2017-05-01 | Disposition: A | Discharge status: Home / Self Care | Payer: Medicare Other | Source: Ambulatory Visit | Attending: Sports Medicine | Admitting: Sports Medicine

## 2017-05-01 DIAGNOSIS — M25511 Pain in right shoulder: Secondary | ICD-10-CM

## 2017-05-01 DIAGNOSIS — S42101A Fracture of unspecified part of scapula, right shoulder, initial encounter for closed fracture: Secondary | ICD-10-CM | POA: Diagnosis not present

## 2017-05-05 ENCOUNTER — Ambulatory Visit (INDEPENDENT_AMBULATORY_CARE_PROVIDER_SITE_OTHER): Payer: Medicare Other | Admitting: Sports Medicine

## 2017-05-05 ENCOUNTER — Encounter: Payer: Self-pay | Admitting: Sports Medicine

## 2017-05-05 VITALS — BP 182/70 | Ht 60.0 in | Wt 155.0 lb

## 2017-05-05 DIAGNOSIS — S42115D Nondisplaced fracture of body of scapula, left shoulder, subsequent encounter for fracture with routine healing: Secondary | ICD-10-CM

## 2017-05-05 NOTE — Progress Notes (Deleted)
   HPI  CC: ***   Medications/Interventions Tried: ***  See HPI and/or previous note for associated ROS.  Objective: There were no vitals taken for this visit. Gen: ***-Hand Dominant. NAD, well groomed, a/o x3, normal affect.  CV: Well-perfused. Warm.  Resp: Non-labored.  Neuro: Sensation intact throughout. No*** gross coordination deficits.  Gait: ***Nonpathologic posture, ***unremarkable stride without signs of limp or balance issues. ***  Assessment and plan:  No problem-specific Assessment & Plan notes found for this encounter.   No orders of the defined types were placed in this encounter.   No orders of the defined types were placed in this encounter.

## 2017-05-05 NOTE — Progress Notes (Signed)
   Subjective:    Patient ID: Sara Russell, female    DOB: 05/13/1930, 84 y.o.   MRN: [id number]  HPI   Sara Russell comes in today for follow-up on a right scapular fracture. She continues to improve. She does endorse intermittent pain with certain motions but overall is doing well. She has been limited with her rehabilitation secondary to recent cataract surgery.   Review of Systems As above    Objective:   Physical Exam  Well-developed, well-nourished. No acute distress.Awake alert and oriented 3. Vital signs reviewed  Right shoulder: Patient has active forward flexion and abduction to about 120-130. Full internal rotation. External rotation is to about 70 degrees. She still has weakness with rotator cuff stressing but no pain. No tenderness to palpation over the fracture site.Neurovascularly intact distally.  X-rays of the right scapula show abundant callus around the fracture site      Assessment & Plan:   Healing scapular fracture, right shoulder  Patient's injury is over 3 months old. She has abundant callus formation around the fracture site. She continues to improve clinically. At this point I will discharge her from my care to follow-up as needed. Of note, she does have some rotator cuff tendinopathy and tearing which was appreciated on her previous CT scan. If she has pain that interferes with her quality of life then we can consider a cortisone injection at a later date. Follow-up for ongoing or recalcitrant issues.

## 2017-05-23 ENCOUNTER — Ambulatory Visit
Admission: RE | Admit: 2017-05-23 | Discharge: 2017-05-23 | Disposition: A | Discharge status: Home / Self Care | Payer: Medicare Other | Source: Ambulatory Visit | Attending: Family Medicine | Admitting: Family Medicine

## 2017-05-23 DIAGNOSIS — Z1231 Encounter for screening mammogram for malignant neoplasm of breast: Secondary | ICD-10-CM

## 2017-06-13 DIAGNOSIS — L570 Actinic keratosis: Secondary | ICD-10-CM | POA: Diagnosis not present

## 2017-06-13 DIAGNOSIS — L821 Other seborrheic keratosis: Secondary | ICD-10-CM | POA: Diagnosis not present

## 2017-06-13 DIAGNOSIS — L820 Inflamed seborrheic keratosis: Secondary | ICD-10-CM | POA: Diagnosis not present

## 2017-06-13 DIAGNOSIS — D0439 Carcinoma in situ of skin of other parts of face: Secondary | ICD-10-CM | POA: Diagnosis not present

## 2017-06-13 DIAGNOSIS — D692 Other nonthrombocytopenic purpura: Secondary | ICD-10-CM | POA: Diagnosis not present

## 2017-06-13 DIAGNOSIS — D2272 Melanocytic nevi of left lower limb, including hip: Secondary | ICD-10-CM | POA: Diagnosis not present

## 2017-06-23 ENCOUNTER — Telehealth: Payer: Self-pay

## 2017-06-23 NOTE — Telephone Encounter (Signed)
Spoke to pt and informed her that I would look into it and see if I could find someone who may know a little more about this information. Katharina Caper, Dakia Schifano D, [place]

## 2017-06-23 NOTE — Telephone Encounter (Signed)
Patient left message on nurse line asking to speak with PCP's assistant. She has questions regarding the [facility] service coordination.  Call back is [phone]  Danley Danker, RN [facility])

## 2017-06-30 NOTE — Telephone Encounter (Signed)
Called and discussed: Sara Russell had some general questions: Yes, I am a participant in [facility], a next gen ACO.  Discussed what that means.  Then she had some specific questions about her bill which I could not answer.  Will forward message.

## 2017-06-30 NOTE — Telephone Encounter (Signed)
Pt left message on nurse line requesting a call back from April. Call back number [phone] Wallace Cullens, RN

## 2017-07-29 ENCOUNTER — Other Ambulatory Visit: Payer: Self-pay | Admitting: *Deleted

## 2017-07-29 DIAGNOSIS — I5032 Chronic diastolic (congestive) heart failure: Secondary | ICD-10-CM

## 2017-07-29 MED ORDER — LEVOTHYROXINE SODIUM 75 MCG PO TABS: 75.0000 ug | ORAL_TABLET | Freq: Every day | ORAL | 3 refills | Status: DC

## 2017-07-29 MED ORDER — APIXABAN 2.5 MG PO TABS: 2.5000 mg | ORAL_TABLET | Freq: Two times a day (BID) | ORAL | 3 refills | Status: DC

## 2017-07-29 MED ORDER — FUROSEMIDE 20 MG PO TABS: 20.0000 mg | ORAL_TABLET | ORAL | 3 refills | Status: DC | PRN

## 2017-07-29 MED ORDER — AMIODARONE HCL 100 MG PO TABS: 100.0000 mg | ORAL_TABLET | Freq: Every day | ORAL | 3 refills | Status: AC

## 2017-07-29 MED ORDER — ATORVASTATIN CALCIUM 10 MG PO TABS: 10.0000 mg | ORAL_TABLET | Freq: Every day | ORAL | 3 refills | Status: DC

## 2017-07-29 NOTE — Telephone Encounter (Signed)
Pt never heard back from [facility].  Forwarded to [employer]. Carman Auxier, Salome Spotted, CMA

## 2017-08-02 ENCOUNTER — Emergency Department (HOSPITAL_COMMUNITY): Payer: Medicare Other

## 2017-08-02 ENCOUNTER — Inpatient Hospital Stay (HOSPITAL_COMMUNITY)
Admission: EM | Admit: 2017-08-02 | Discharge: 2017-08-06 | DRG: 064 | Disposition: A | Discharge status: Rehabilitation Facility | Payer: Medicare Other | Attending: Neurology | Admitting: Neurology

## 2017-08-02 ENCOUNTER — Encounter (HOSPITAL_COMMUNITY): Payer: Self-pay

## 2017-08-02 DIAGNOSIS — I69122 Dysarthria following nontraumatic intracerebral hemorrhage: Secondary | ICD-10-CM | POA: Diagnosis not present

## 2017-08-02 DIAGNOSIS — Z85828 Personal history of other malignant neoplasm of skin: Secondary | ICD-10-CM

## 2017-08-02 DIAGNOSIS — M79609 Pain in unspecified limb: Secondary | ICD-10-CM | POA: Diagnosis not present

## 2017-08-02 DIAGNOSIS — I482 Chronic atrial fibrillation: Secondary | ICD-10-CM | POA: Diagnosis not present

## 2017-08-02 DIAGNOSIS — G8191 Hemiplegia, unspecified affecting right dominant side: Secondary | ICD-10-CM | POA: Diagnosis present

## 2017-08-02 DIAGNOSIS — N183 Chronic kidney disease, stage 3 unspecified: Secondary | ICD-10-CM

## 2017-08-02 DIAGNOSIS — I619 Nontraumatic intracerebral hemorrhage, unspecified: Secondary | ICD-10-CM

## 2017-08-02 DIAGNOSIS — Z86711 Personal history of pulmonary embolism: Secondary | ICD-10-CM

## 2017-08-02 DIAGNOSIS — R402342 Coma scale, best motor response, flexion withdrawal, at arrival to emergency department: Secondary | ICD-10-CM | POA: Diagnosis present

## 2017-08-02 DIAGNOSIS — Z515 Encounter for palliative care: Secondary | ICD-10-CM | POA: Diagnosis not present

## 2017-08-02 DIAGNOSIS — R29810 Facial weakness: Secondary | ICD-10-CM | POA: Diagnosis present

## 2017-08-02 DIAGNOSIS — R4701 Aphasia: Secondary | ICD-10-CM | POA: Diagnosis present

## 2017-08-02 DIAGNOSIS — I361 Nonrheumatic tricuspid (valve) insufficiency: Secondary | ICD-10-CM | POA: Diagnosis not present

## 2017-08-02 DIAGNOSIS — R4781 Slurred speech: Secondary | ICD-10-CM | POA: Diagnosis not present

## 2017-08-02 DIAGNOSIS — I639 Cerebral infarction, unspecified: Secondary | ICD-10-CM | POA: Diagnosis not present

## 2017-08-02 DIAGNOSIS — Z91041 Radiographic dye allergy status: Secondary | ICD-10-CM

## 2017-08-02 DIAGNOSIS — I481 Persistent atrial fibrillation: Secondary | ICD-10-CM | POA: Diagnosis not present

## 2017-08-02 DIAGNOSIS — G936 Cerebral edema: Secondary | ICD-10-CM | POA: Diagnosis present

## 2017-08-02 DIAGNOSIS — Z86718 Personal history of other venous thrombosis and embolism: Secondary | ICD-10-CM

## 2017-08-02 DIAGNOSIS — I10 Essential (primary) hypertension: Secondary | ICD-10-CM | POA: Diagnosis present

## 2017-08-02 DIAGNOSIS — Z87891 Personal history of nicotine dependence: Secondary | ICD-10-CM

## 2017-08-02 DIAGNOSIS — I610 Nontraumatic intracerebral hemorrhage in hemisphere, subcortical: Secondary | ICD-10-CM | POA: Diagnosis not present

## 2017-08-02 DIAGNOSIS — I480 Paroxysmal atrial fibrillation: Secondary | ICD-10-CM | POA: Diagnosis present

## 2017-08-02 DIAGNOSIS — Z96652 Presence of left artificial knee joint: Secondary | ICD-10-CM | POA: Diagnosis present

## 2017-08-02 DIAGNOSIS — R29818 Other symptoms and signs involving the nervous system: Secondary | ICD-10-CM | POA: Diagnosis not present

## 2017-08-02 DIAGNOSIS — D6832 Hemorrhagic disorder due to extrinsic circulating anticoagulants: Secondary | ICD-10-CM | POA: Diagnosis present

## 2017-08-02 DIAGNOSIS — R339 Retention of urine, unspecified: Secondary | ICD-10-CM | POA: Diagnosis not present

## 2017-08-02 DIAGNOSIS — M25551 Pain in right hip: Secondary | ICD-10-CM | POA: Diagnosis not present

## 2017-08-02 DIAGNOSIS — Z823 Family history of stroke: Secondary | ICD-10-CM

## 2017-08-02 DIAGNOSIS — I69120 Aphasia following nontraumatic intracerebral hemorrhage: Secondary | ICD-10-CM | POA: Diagnosis not present

## 2017-08-02 DIAGNOSIS — M810 Age-related osteoporosis without current pathological fracture: Secondary | ICD-10-CM | POA: Diagnosis present

## 2017-08-02 DIAGNOSIS — Z91013 Allergy to seafood: Secondary | ICD-10-CM

## 2017-08-02 DIAGNOSIS — M159 Polyosteoarthritis, unspecified: Secondary | ICD-10-CM | POA: Diagnosis present

## 2017-08-02 DIAGNOSIS — Z882 Allergy status to sulfonamides status: Secondary | ICD-10-CM

## 2017-08-02 DIAGNOSIS — I69192 Facial weakness following nontraumatic intracerebral hemorrhage: Secondary | ICD-10-CM | POA: Diagnosis not present

## 2017-08-02 DIAGNOSIS — R609 Edema, unspecified: Secondary | ICD-10-CM | POA: Diagnosis not present

## 2017-08-02 DIAGNOSIS — D500 Iron deficiency anemia secondary to blood loss (chronic): Secondary | ICD-10-CM | POA: Diagnosis not present

## 2017-08-02 DIAGNOSIS — E7800 Pure hypercholesterolemia, unspecified: Secondary | ICD-10-CM | POA: Diagnosis present

## 2017-08-02 DIAGNOSIS — I130 Hypertensive heart and chronic kidney disease with heart failure and stage 1 through stage 4 chronic kidney disease, or unspecified chronic kidney disease: Secondary | ICD-10-CM | POA: Diagnosis not present

## 2017-08-02 DIAGNOSIS — Z800 Family history of malignant neoplasm of digestive organs: Secondary | ICD-10-CM | POA: Diagnosis not present

## 2017-08-02 DIAGNOSIS — K5190 Ulcerative colitis, unspecified, without complications: Secondary | ICD-10-CM | POA: Diagnosis present

## 2017-08-02 DIAGNOSIS — Y92009 Unspecified place in unspecified non-institutional (private) residence as the place of occurrence of the external cause: Secondary | ICD-10-CM

## 2017-08-02 DIAGNOSIS — E871 Hypo-osmolality and hyponatremia: Secondary | ICD-10-CM | POA: Diagnosis not present

## 2017-08-02 DIAGNOSIS — E876 Hypokalemia: Secondary | ICD-10-CM | POA: Diagnosis not present

## 2017-08-02 DIAGNOSIS — Z8249 Family history of ischemic heart disease and other diseases of the circulatory system: Secondary | ICD-10-CM

## 2017-08-02 DIAGNOSIS — R402222 Coma scale, best verbal response, incomprehensible words, at arrival to emergency department: Secondary | ICD-10-CM | POA: Diagnosis present

## 2017-08-02 DIAGNOSIS — R52 Pain, unspecified: Secondary | ICD-10-CM

## 2017-08-02 DIAGNOSIS — N289 Disorder of kidney and ureter, unspecified: Secondary | ICD-10-CM | POA: Diagnosis not present

## 2017-08-02 DIAGNOSIS — Z66 Do not resuscitate: Secondary | ICD-10-CM | POA: Diagnosis not present

## 2017-08-02 DIAGNOSIS — R402132 Coma scale, eyes open, to sound, at arrival to emergency department: Secondary | ICD-10-CM | POA: Diagnosis present

## 2017-08-02 DIAGNOSIS — E039 Hypothyroidism, unspecified: Secondary | ICD-10-CM | POA: Diagnosis present

## 2017-08-02 DIAGNOSIS — I5032 Chronic diastolic (congestive) heart failure: Secondary | ICD-10-CM | POA: Diagnosis present

## 2017-08-02 DIAGNOSIS — D62 Acute posthemorrhagic anemia: Secondary | ICD-10-CM | POA: Diagnosis not present

## 2017-08-02 DIAGNOSIS — E785 Hyperlipidemia, unspecified: Secondary | ICD-10-CM | POA: Diagnosis not present

## 2017-08-02 DIAGNOSIS — Z7901 Long term (current) use of anticoagulants: Secondary | ICD-10-CM | POA: Diagnosis not present

## 2017-08-02 DIAGNOSIS — Z803 Family history of malignant neoplasm of breast: Secondary | ICD-10-CM

## 2017-08-02 DIAGNOSIS — I69151 Hemiplegia and hemiparesis following nontraumatic intracerebral hemorrhage affecting right dominant side: Secondary | ICD-10-CM | POA: Diagnosis present

## 2017-08-02 DIAGNOSIS — K5790 Diverticulosis of intestine, part unspecified, without perforation or abscess without bleeding: Secondary | ICD-10-CM | POA: Diagnosis present

## 2017-08-02 DIAGNOSIS — I69153 Hemiplegia and hemiparesis following nontraumatic intracerebral hemorrhage affecting right non-dominant side: Secondary | ICD-10-CM | POA: Diagnosis not present

## 2017-08-02 DIAGNOSIS — K219 Gastro-esophageal reflux disease without esophagitis: Secondary | ICD-10-CM | POA: Diagnosis present

## 2017-08-02 DIAGNOSIS — Z7189 Other specified counseling: Secondary | ICD-10-CM | POA: Diagnosis not present

## 2017-08-02 DIAGNOSIS — T45515A Adverse effect of anticoagulants, initial encounter: Secondary | ICD-10-CM | POA: Diagnosis present

## 2017-08-02 DIAGNOSIS — R531 Weakness: Secondary | ICD-10-CM | POA: Diagnosis not present

## 2017-08-02 DIAGNOSIS — Z9181 History of falling: Secondary | ICD-10-CM

## 2017-08-02 DIAGNOSIS — R0989 Other specified symptoms and signs involving the circulatory and respiratory systems: Secondary | ICD-10-CM | POA: Diagnosis not present

## 2017-08-02 DIAGNOSIS — I69320 Aphasia following cerebral infarction: Secondary | ICD-10-CM | POA: Diagnosis not present

## 2017-08-02 DIAGNOSIS — Z86010 Personal history of colonic polyps: Secondary | ICD-10-CM | POA: Diagnosis not present

## 2017-08-02 DIAGNOSIS — I611 Nontraumatic intracerebral hemorrhage in hemisphere, cortical: Secondary | ICD-10-CM | POA: Diagnosis not present

## 2017-08-02 DIAGNOSIS — M1990 Unspecified osteoarthritis, unspecified site: Secondary | ICD-10-CM | POA: Diagnosis present

## 2017-08-02 DIAGNOSIS — N390 Urinary tract infection, site not specified: Secondary | ICD-10-CM | POA: Diagnosis not present

## 2017-08-02 LAB — I-STAT TROPONIN, ED: Troponin i, poc: 0 ng/mL (ref 0.00–0.08)

## 2017-08-02 LAB — PROTIME-INR
INR: 1.24
Prothrombin Time: 15.5 seconds — ABNORMAL HIGH (ref 11.4–15.2)

## 2017-08-02 LAB — CBG MONITORING, ED: Glucose-Capillary: 96 mg/dL (ref 65–99)

## 2017-08-02 LAB — DIFFERENTIAL
Abs Immature Granulocytes: 0 10*3/uL (ref 0.0–0.1)
Basophils Absolute: 0.1 10*3/uL (ref 0.0–0.1)
Basophils Relative: 1 %
Eosinophils Absolute: 0.2 10*3/uL (ref 0.0–0.7)
Eosinophils Relative: 2 %
Immature Granulocytes: 0 %
Lymphocytes Relative: 17 %
Lymphs Abs: 1.2 10*3/uL (ref 0.7–4.0)
Monocytes Absolute: 0.7 10*3/uL (ref 0.1–1.0)
Monocytes Relative: 10 %
Neutro Abs: 4.9 10*3/uL (ref 1.7–7.7)
Neutrophils Relative %: 70 %

## 2017-08-02 LAB — COMPREHENSIVE METABOLIC PANEL
ALT: 13 U/L — ABNORMAL LOW (ref 14–54)
AST: 21 U/L (ref 15–41)
Albumin: 3.7 g/dL (ref 3.5–5.0)
Alkaline Phosphatase: 74 U/L (ref 38–126)
Anion gap: 12 (ref 5–15)
BUN: 13 mg/dL (ref 6–20)
CO2: 20 mmol/L — ABNORMAL LOW (ref 22–32)
Calcium: 8.8 mg/dL — ABNORMAL LOW (ref 8.9–10.3)
Chloride: 98 mmol/L — ABNORMAL LOW (ref 101–111)
Creatinine, Ser: 1.06 mg/dL — ABNORMAL HIGH (ref 0.44–1.00)
GFR calc Af Amer: 54 mL/min — ABNORMAL LOW (ref >60)
GFR calc non Af Amer: 47 mL/min — ABNORMAL LOW (ref >60)
Glucose, Bld: 99 mg/dL (ref 65–99)
Potassium: 3.9 mmol/L (ref 3.5–5.1)
Sodium: 130 mmol/L — ABNORMAL LOW (ref 135–145)
Total Bilirubin: 0.6 mg/dL (ref 0.3–1.2)
Total Protein: 6.4 g/dL — ABNORMAL LOW (ref 6.5–8.1)

## 2017-08-02 LAB — APTT: aPTT: 34 seconds (ref 24–36)

## 2017-08-02 LAB — CBC
HCT: 36.6 % (ref 36.0–46.0)
Hemoglobin: 11.9 g/dL — ABNORMAL LOW (ref 12.0–15.0)
MCH: 31.2 pg (ref 26.0–34.0)
MCHC: 32.5 g/dL (ref 30.0–36.0)
MCV: 95.8 fL (ref 78.0–100.0)
Platelets: 204 10*3/uL (ref 150–400)
RBC: 3.82 MIL/uL — ABNORMAL LOW (ref 3.87–5.11)
RDW: 12.5 % (ref 11.5–15.5)
WBC: 7.1 10*3/uL (ref 4.0–10.5)

## 2017-08-02 LAB — I-STAT CHEM 8, ED
BUN: 13 mg/dL (ref 6–20)
Calcium, Ion: 1.11 mmol/L — ABNORMAL LOW (ref 1.15–1.40)
Chloride: 97 mmol/L — ABNORMAL LOW (ref 101–111)
Creatinine, Ser: 1.1 mg/dL — ABNORMAL HIGH (ref 0.44–1.00)
Glucose, Bld: 98 mg/dL (ref 65–99)
HCT: 36 % (ref 36.0–46.0)
Hemoglobin: 12.2 g/dL (ref 12.0–15.0)
Potassium: 3.9 mmol/L (ref 3.5–5.1)
Sodium: 131 mmol/L — ABNORMAL LOW (ref 135–145)
TCO2: 24 mmol/L (ref 22–32)

## 2017-08-02 LAB — ETHANOL: Alcohol, Ethyl (B): <10 mg/dL (ref <10)

## 2017-08-02 MED ORDER — LABETALOL HCL 5 MG/ML IV SOLN
10.0000 mg | INTRAVENOUS | Status: DC | PRN
  Administered 2017-08-02: 10 mg via INTRAVENOUS

## 2017-08-02 MED ORDER — CLEVIDIPINE BUTYRATE 0.5 MG/ML IV EMUL
INTRAVENOUS | Status: AC
  Administered 2017-08-02: 1 mg/h via INTRAVENOUS
  Filled 2017-08-02: qty 50

## 2017-08-02 MED ORDER — EMPTY CONTAINERS FLEXIBLE MISC
900.0000 mg | Freq: Once | Status: AC
  Administered 2017-08-02: 900 mg via INTRAVENOUS
  Filled 2017-08-02: qty 90

## 2017-08-02 MED ORDER — LABETALOL HCL 5 MG/ML IV SOLN
INTRAVENOUS | Status: AC
  Administered 2017-08-02: 10 mg via INTRAVENOUS
  Filled 2017-08-02: qty 4

## 2017-08-02 MED ORDER — FENTANYL CITRATE (PF) 100 MCG/2ML IJ SOLN
12.5000 ug | Freq: Once | INTRAMUSCULAR | Status: AC
  Administered 2017-08-02: 12.5 ug via INTRAVENOUS
  Filled 2017-08-02: qty 2

## 2017-08-02 MED ORDER — CLEVIDIPINE BUTYRATE 0.5 MG/ML IV EMUL
0.0000 mg/h | INTRAVENOUS | Status: DC
  Administered 2017-08-02: 6 mg/h via INTRAVENOUS
  Administered 2017-08-02: 1 mg/h via INTRAVENOUS
  Administered 2017-08-03: 5.5 mg/h via INTRAVENOUS
  Administered 2017-08-03: 6 mg/h via INTRAVENOUS
  Administered 2017-08-03: 10 mg/h via INTRAVENOUS
  Administered 2017-08-03: 6 mg/h via INTRAVENOUS
  Administered 2017-08-03: 8 mg/h via INTRAVENOUS
  Administered 2017-08-04: 4 mg/h via INTRAVENOUS
  Administered 2017-08-04: 3 mg/h via INTRAVENOUS
  Filled 2017-08-02 (×7): qty 50

## 2017-08-02 NOTE — ED Triage Notes (Signed)
Pt arrived via [facility]; per EMS pt from [facility]; while at dinner facily residents witnessed her having a "stroke." Pt had R sided weakness, facial droop, and no speech. 221/89; CBG 86; 97% on RA; bilateral 18G

## 2017-08-02 NOTE — H&P (Signed)
Admission H&P    Chief Complaint: Sudden onset of right sided weakness  HPI: Sara Russell is an 84 y.o. female who presents via EMS to the ED with sudden onset of right facial droop, right sided weakness and aphasia. LKN was 1800, the same as TOSO. She sat down for dinner and then was noted to be "having a stroke" by companions at the table, with the above symptoms. BP prior to arrival was 221/89; CBG 86; 97% on RA; bilateral 18G were placed.   The patient is on Eliquis for a-fib. Other stroke risk factors are HLD and HTN. She has a history of DVT and PE as well.    Past Medical History:  Diagnosis Date  . Abdominal mass   . AKI (acute kidney injury) ([place]) 09/15/2014  . Atrial fibrillation ([place])   . Colitis, ulcerative ([place])   . Cough    WHITE SPUTUM FOR 2 WEEKS, NO FEVER  . Degenerative joint disease   . Diverticulosis   . DVT (deep venous thrombosis) ([place]) 2009   BOTH LUNGS  . GERD (gastroesophageal reflux disease)   . Hyperlipidemia   . Hyperplastic colon polyp   . Hypertension   . Hypothyroidism   . Iron deficiency anemia    ON IRON AT TIMES  . PE (pulmonary embolism) 2009  . Persistent atrial fibrillation ([place])   . Positive H. pylori test 01/20/96  . S/P dilatation of esophageal stricture 1992  . Skin cancer    h/o basal and squamous skin cancer--> removed    Past Surgical History:  Procedure Laterality Date  . APPENDECTOMY    . BREAST EXCISIONAL BIOPSY Left 1992   benign  . BREAST LUMPECTOMY     benign  . DILATION AND CURETTAGE OF UTERUS     for endometrial polyps  . ENDOSCOPIC RETROGRADE CHOLANGIOPANCREATOGRAPHY (ERCP) WITH PROPOFOL N/A 03/23/2015   Procedure: ENDOSCOPIC RETROGRADE CHOLANGIOPANCREATOGRAPHY (ERCP) WITH PROPOFOL;  Surgeon: Milus Banister, MD;  Location: [facility] ENDOSCOPY;  Service: Endoscopy;  Laterality: N/A;  . JOINT REPLACEMENT    . ORIF ANKLE FRACTURE Left    X 2  . REPLACEMENT TOTAL KNEE     left  . SURGERY DONE TO REMOVE LEFT ANKLE HARDWARE  LATER    . TONSILLECTOMY      Family History  Problem Relation Age of Onset  . Esophageal cancer Brother   . Diverticulitis Brother   . Heart disease Mother   . Stroke Mother   . Dementia Mother   . Heart attack Mother   . Heart disease Father   . Atrial fibrillation Father   . Breast cancer Maternal Aunt   . Colon cancer Neg Hx    Social History:  reports that she quit smoking about 56 years ago. Her smoking use included cigarettes. She started smoking about 69 years ago. She has a 6.50 pack-year smoking history. She has never used smokeless tobacco. She reports that she drinks about 3.0 oz of alcohol per week. She reports that she does not use drugs.  Allergies:  Allergies  Allergen Reactions  . Iohexol Other (See Comments)    unknown  . Ivp Dye [Iodinated Diagnostic Agents] Hives    Was able to tolerate Contrast Media for CT scan.  [name] [Shellfish Allergy] Other (See Comments)    Projectile vomiting.  . Sulfa Antibiotics Hives  . Sulfamethoxazole Hives    REACTION: unspecified     (Not in a hospital admission)  ROS: Unable to obtain due to aphasia.  Physical Examination: Weight 70.7 kg (155 lb 13.8 oz).  HEENT-  [place]/AT  Lungs - Respirations unlabored Extremities - Warm and well perfused  Neurologic Examination: Mental Status: Awake with decreased level of alertness. Dense expressive aphasia. Does not attempt to communicate. Follows about 20% of simple motor commands. Non-agitated. Right hemineglect.  Cranial Nerves: II:  No blink to threat on right. Blinks to threat on left. PERRL. III,IV, VI: Eyes conjugate, will track to right and left; has significant difficulty tracking to right. No nystagmus. No ptosis.  V,VII: Right facial droop. Decreased reactivity to right sided stimuli.  VIII: Unable to assess other than able to follow some commands.  IX,X: Does not open mouth for assessment. XI: Head is at midline.  XII: Does not protrude tongue to  command Motor: RUE: Flaccid. Drops to bed when raised without effort against gravity . RLE: Flexes at hip and knee with plantar stimulation, 3/5 strength.  LUE: Briskly reacts to noxious with 4-5/5 strength LLE: Briskly reacts to noxious with 4-5/5 strength Sensory: Reacts to noxious stimuli on right and left but with delay to right sided stimuli Deep Tendon Reflexes:  1+ throughout except for 0 achilles bilaterally Plantars: Tonically upgoing toes bilaterally.  Cerebellar: Unable to assess as not following commands.  Gait: Unable to assess.   Results for orders placed or performed during the hospital encounter of 08/02/17 (from the past 48 hour(s))  CBC     Status: Abnormal   Collection Time: 08/02/17  7:07 PM  Result Value Ref Range   WBC 7.1 4.0 - 10.5 K/uL   RBC 3.82 (L) 3.87 - 5.11 MIL/uL   Hemoglobin 11.9 (L) 12.0 - 15.0 g/dL   HCT 36.6 36.0 - 46.0 %   MCV 95.8 78.0 - 100.0 fL   MCH 31.2 26.0 - 34.0 pg   MCHC 32.5 30.0 - 36.0 g/dL   RDW 12.5 11.5 - 15.5 %   Platelets 204 150 - 400 K/uL    Comment: Performed at [facility], [place] [address], [place], [place] [address]  Differential     Status: None   Collection Time: 08/02/17  7:07 PM  Result Value Ref Range   Neutrophils Relative % 70 %   Neutro Abs 4.9 1.7 - 7.7 K/uL   Lymphocytes Relative 17 %   Lymphs Abs 1.2 0.7 - 4.0 K/uL   Monocytes Relative 10 %   Monocytes Absolute 0.7 0.1 - 1.0 K/uL   Eosinophils Relative 2 %   Eosinophils Absolute 0.2 0.0 - 0.7 K/uL   Basophils Relative 1 %   Basophils Absolute 0.1 0.0 - 0.1 K/uL   Immature Granulocytes 0 %   Abs Immature Granulocytes 0.0 0.0 - 0.1 K/uL    Comment: Performed at [facility] [address], [place], [place] [address]  CBG monitoring, ED     Status: None   Collection Time: 08/02/17  7:08 PM  Result Value Ref Range   Glucose-Capillary 96 65 - 99 mg/dL  I-stat troponin, ED     Status: None   Collection Time: 08/02/17  7:13 PM  Result Value Ref  Range   Troponin i, poc 0.00 0.00 - 0.08 ng/mL   Comment 3            Comment: Due to the release kinetics of cTnI, a negative result within the first hours of the onset of symptoms does not rule out myocardial infarction with certainty. If myocardial infarction is still suspected, repeat the test  at appropriate intervals.   I-Stat Chem 8, ED     Status: Abnormal   Collection Time: 08/02/17  7:15 PM  Result Value Ref Range   Sodium 131 (L) 135 - 145 mmol/L   Potassium 3.9 3.5 - 5.1 mmol/L   Chloride 97 (L) 101 - 111 mmol/L   BUN 13 6 - 20 mg/dL   Creatinine, Ser 1.10 (H) 0.44 - 1.00 mg/dL   Glucose, Bld 98 65 - 99 mg/dL   Calcium, Ion 1.11 (L) 1.15 - 1.40 mmol/L   TCO2 24 22 - 32 mmol/L   Hemoglobin 12.2 12.0 - 15.0 g/dL   HCT 36.0 36.0 - 46.0 %   No results found.  Assessment: 84 year old female with acute left thalamus/IC/basal ganglia hemorrhage 1. Exam reveals right sided deficits and aphasia, consistent with the above. 2. On Eliquis at home for atrial fibrillation.   Recommendations: 1. Admit to ICU under Neurology service 2. BP control with Labetalol and clevidipine 3. Andexxa is indicated. Pharmacy to dose. 4. MRI of head 5. PT consult, OT consult, Speech consult 6. Cardiac telemetry 7. Frequent neuro checks 8. Stop Eliquis. Given ICH while on this medication, it should be stopped indefinitely. If hemorrhage resorbing with no complications at 2-4 weeks, can consider starting ASA for stroke prevention in the setting of her atrial fibrillation.  9. DVT prophylaxis with SCDs 10. Gentle IVF  50 minutes spent in the emergent neurological evaluation and management of this critically ill acute ICH patient  Electronically signed: Dr. Kerney Elbe 08/02/2017, 7:30 PM

## 2017-08-02 NOTE — ED Provider Notes (Signed)
Emergency Department Provider Note   I have reviewed the triage vital signs and the nursing notes.   HISTORY  Chief Complaint Code Stroke   HPI Sara Russell is a 84 y.o. female 84 year old female with the below medical problems who presents the emergency department today secondary to code stroke.  Patient is a phasic and unable to provide history but it sounds like she was at [facility] and she was at dinner and the residents knows that she was staring off in space they called the manager who called EMS.  On arrival she had right-sided weakness and facial droop and was a phasic and hypertensive.  CBG was 86 thus her vital signs within normal limits so she presented here for further evaluation. No other associated or modifying symptoms.   Level V Caveat Secondary to aphasia  Past Medical History:  Diagnosis Date  . Abdominal mass   . AKI (acute kidney injury) ([place]) 09/15/2014  . Atrial fibrillation ([place])   . Colitis, ulcerative ([place])   . Cough    WHITE SPUTUM FOR 2 WEEKS, NO FEVER  . Degenerative joint disease   . Diverticulosis   . DVT (deep venous thrombosis) ([place]) 2009   BOTH LUNGS  . GERD (gastroesophageal reflux disease)   . Hyperlipidemia   . Hyperplastic colon polyp   . Hypertension   . Hypothyroidism   . Iron deficiency anemia    ON IRON AT TIMES  . PE (pulmonary embolism) 2009  . Persistent atrial fibrillation ([place])   . Positive H. pylori test 01/20/96  . S/P dilatation of esophageal stricture 1992  . Skin cancer    h/o basal and squamous skin cancer--> removed    Patient Active Problem List   Diagnosis Date Noted  . Hemorrhagic stroke ([place]) 08/02/2017  . At high risk for falls 02/12/2017  . Insomnia 02/12/2017  . Pulmonary nodules/lesions, multiple 01/29/2017  . Acute pain of right shoulder 01/28/2017  . Hypothyroidism 04/04/2015  . Compression fracture of L1 lumbar vertebra ([facility]) 02/23/2015  . Cholelithiases 02/23/2015  . Encounter for  palliative care   . Chronic diastolic congestive heart failure ([place])   . Osteoporosis 05/13/2014  . Gout 08/16/2013  . Hyponatremia 08/10/2013  . Left atrial enlargement 01/10/2013  . A-fib ([place]) 12/08/2012  . Rosacea 05/15/2012  . Anemia due to chronic blood loss 08/28/2010  . DEGENERATIVE DISC DISEASE, LUMBAR SPINE 07/14/2009  . GERD 07/11/2009  . PERSONAL HX COLONIC POLYPS 07/11/2009  . HYPERCHOLESTEROLEMIA 04/10/2006  . HYPERTENSION, BENIGN SYSTEMIC 04/10/2006  . History of pulmonary embolism 04/10/2006  . DIVERTICULOSIS OF COLON 04/10/2006  . ACTINIC KERATOSIS 04/10/2006  . GEN OSTEOARTHROSIS INVOLVING MULTIPLE SITES 04/10/2006    Past Surgical History:  Procedure Laterality Date  . APPENDECTOMY    . BREAST EXCISIONAL BIOPSY Left 1992   benign  . BREAST LUMPECTOMY     benign  . DILATION AND CURETTAGE OF UTERUS     for endometrial polyps  . ENDOSCOPIC RETROGRADE CHOLANGIOPANCREATOGRAPHY (ERCP) WITH PROPOFOL N/A 03/23/2015   Procedure: ENDOSCOPIC RETROGRADE CHOLANGIOPANCREATOGRAPHY (ERCP) WITH PROPOFOL;  Surgeon: Milus Banister, MD;  Location: [facility] END[facility]COPY;  Service: Endoscopy;  Laterality: N/A;  . JOINT REPLACEMENT    . ORIF ANKLE FRACTURE Left    X 2  . REPLACEMENT TOTAL KNEE     left  . SURGERY DONE TO REMOVE LEFT ANKLE HARDWARE LATER    . TONSILLECTOMY      Current Outpatient Rx  . Order #: [id number] Class: Historical Med  .  Order #: [id number] Class: Normal  . Order #: [id number] Class: Normal  . Order #: [id number] Class: Normal  . Order #: [id number] Class: Normal  . Order #: [id number] Class: Historical Med  . Order #: [id number] Class: Normal  . Order #: [id number] Class: Historical Med  . Order #: [id number] Class: Normal  . Order #: [id number] Class: Normal  . Order #: [id number] Class: Historical Med  . Order #: [id number] Class: Print  . Order #: [id number] Class: Normal    Allergies Iohexol; Ivp dye [iodinated diagnostic agents]; Scallops [shellfish allergy]; Sulfa  antibiotics; and Sulfamethoxazole  Family History  Problem Relation Age of Onset  . Esophageal cancer Brother   . Diverticulitis Brother   . Heart disease Mother   . Stroke Mother   . Dementia Mother   . Heart attack Mother   . Heart disease Father   . Atrial fibrillation Father   . Breast cancer Maternal Aunt   . Colon cancer Neg Hx     Social History Social History   Tobacco Use  . Smoking status: Former Smoker    Packs/day: 0.50    Years: 13.00    Pack years: 6.50    Types: Cigarettes    Start date: 02/12/1948    Last attempt to quit: 02/11/1961    Years since quitting: 56.5  . Smokeless tobacco: Never Used  . Tobacco comment: Denies use of tobacco since 1963  Substance Use Topics  . Alcohol use: Yes    Alcohol/week: 3.0 oz    Types: 5 Glasses of wine per week    Comment: WINE FEW TIMES PER WEEK  . Drug use: No    Review of Systems  Level V Caveat Secondary to aphasia _______________________________________   PHYSICAL EXAM:  VITAL SIGNS: ED Triage Vitals  Enc Vitals Group     BP 08/02/17 1957 (!) 178/67     Pulse Rate 08/02/17 1957 66     Resp 08/02/17 1957 20     Temp 08/02/17 2000 97.7 F (36.5 C)     Temp src --      SpO2 08/02/17 1957 97 %     Weight 08/02/17 1900 155 lb 13.8 oz (70.7 kg)    Constitutional: Alert not following commands.  Eyes: Conjunctivae are normal. PERRL.  Head: Atraumatic. Nose: No congestion/rhinnorhea. Mouth/Throat: Mucous membranes are moist.  Oropharynx non-erythematous. Neck: No stridor.  No meningeal signs.   Cardiovascular: Normal rate, regular rhythm. Good peripheral circulation. Grossly normal heart sounds.   Respiratory: Normal respiratory effort.  No retractions. Lungs CTAB. Gastrointestinal: Soft and nontender. No distention.  Musculoskeletal: No lower extremity tenderness nor edema. No gross deformities of extremities. Neurologic:  No speech and language. Not able to perform full exam Skin:  Skin is warm, dry  and intact. No rash noted.  ____________________________________________   LABS (all labs ordered are listed, but only abnormal results are displayed)  Labs Reviewed  PROTIME-INR - Abnormal; Notable for the following components:      Result Value   Prothrombin Time 15.5 (*)    All other components within normal limits  CBC - Abnormal; Notable for the following components:   RBC 3.82 (*)    Hemoglobin 11.9 (*)    All other components within normal limits  COMPREHENSIVE METABOLIC PANEL - Abnormal; Notable for the following components:   Sodium 130 (*)    Chloride 98 (*)    CO2 20 (*)    Creatinine, Ser 1.06 (*)    Calcium 8.8 (*)    Total Protein 6.4 (*)  ALT 13 (*)    GFR calc non Af Amer 47 (*)    GFR calc Af Amer 54 (*)    All other components within normal limits  I-STAT CHEM 8, ED - Abnormal; Notable for the following components:   Sodium 131 (*)    Chloride 97 (*)    Creatinine, Ser 1.10 (*)    Calcium, Ion 1.11 (*)    All other components within normal limits  ETHANOL  APTT  DIFFERENTIAL  RAPID URINE DRUG SCREEN, HOSP PERFORMED  URINALYSIS, ROUTINE W REFLEX MICROSCOPIC  I-STAT TROPONIN, ED  CBG MONITORING, ED   ____________________________________________  EKG   EKG Interpretation  Date/Time:  Saturday August 02 2017 20:36:09 EDT Ventricular Rate:  70 PR Interval:    QRS Duration: 92 QT Interval:  429 QTC Calculation: 463 R Axis:   35 Text Interpretation:  Sinus or ectopic atrial rhythm No significant change since last tracing Confirmed by Merrily Pew [phone]) on 08/02/2017 9:04:52 PM       ____________________________________________  RADIOLOGY  Ct Head Code Stroke Wo Contrast  Addendum Date: 08/02/2017   ADDENDUM REPORT: 08/02/2017 19:40 ADDENDUM: Critical Value/emergent results were called by telephone at the time of interpretation on 08/02/2017 at 7:39 pm to Dr. Cheral Marker, who verbally acknowledged these results. Electronically Signed   By: Kristine Garbe M.D.   On: 08/02/2017 19:40   Result Date: 08/02/2017 CLINICAL DATA:  Code stroke.  84 y/o  F; right facial droop. EXAM: CT HEAD WITHOUT CONTRAST TECHNIQUE: Contiguous axial images were obtained from the base of the skull through the vertex without intravenous contrast. COMPARISON:  01/28/2017 CT head. FINDINGS: Brain: Acute hemorrhage centered within left thalamus/posterior limb of internal capsule measuring 1.9 x 1.7 x 1.5 cm (volume = 2.5 cm^3). Mild surrounding edema and local mass effect with 3 mm of left-to-right midline shift of septum pellucidum. Stable background of chronic microvascular ischemic changes and parenchymal volume loss of the brain. No extra-axial hemorrhage, large acute stroke, hydrocephalus, or herniation. Vascular: Calcific atherosclerosis of the carotid siphons and vertebral arteries. Hyperdense vessel identified. Skull: Normal. Negative for fracture or focal lesion. Sinuses/Orbits: No acute finding. Other: Bilateral intra-ocular lens replacement. IMPRESSION: 1. Acute hemorrhage centered within left thalamus/posterior limb of internal capsule measuring up to 1.9 cm, 2.5 cc. Mild local mass effect. 2. Stable chronic microvascular ischemic changes and parenchymal volume loss of the brain. 3. ASPECTS is not applicable. Electronically Signed: By: Kristine Garbe M.D. On: 08/02/2017 19:29    ____________________________________________   PROCEDURES  Procedure(s) performed:   Procedures  CRITICAL CARE Performed by: Merrily Pew Total critical care time: 38 minutes Critical care time was exclusive of separately billable procedures and treating other patients. Critical care was necessary to treat or prevent imminent or life-threatening deterioration. Critical care was time spent personally by me on the following activities: development of treatment plan with patient and/or surrogate as well as nursing, discussions with consultants, evaluation of  patient's response to treatment, examination of patient, obtaining history from patient or surrogate, ordering and performing treatments and interventions, ordering and review of laboratory studies, ordering and review of radiographic studies, pulse oximetry and re-evaluation of patient's condition.  ____________________________________________   INITIAL IMPRESSION / ASSESSMENT AND PLAN / ED COURSE  Patient seen immediately upon arrival went straight to CT scan for apparent stroke.  Found to have intraparenchymal bleed.  Is on Eliquis so Andexxa given.  Also hypertensive so started on clevidipine.  Neurology to admit to the ICU.  Pertinent labs & imaging results that were available during my care of the patient were reviewed by me and considered in my medical decision making (see chart for details).  ____________________________________________  FINAL CLINICAL IMPRESSION(S) / ED DIAGNOSES  Final diagnoses:  Hemorrhagic stroke ([place])     MEDICATIONS GIVEN DURING THIS VISIT:  Medications  labetalol (NORMODYNE,TRANDATE) injection 10 mg (10 mg Intravenous Given 08/02/17 1917)  clevidipine (CLEVIPREX) infusion 0.5 mg/mL (6 mg/hr Intravenous Rate/Dose Verify 08/02/17 2057)  coag fact Xa recombinant (ANDEXXA) infusion 900 mg (900 mg Intravenous New Bag/Given 08/02/17 1945)     NEW OUTPATIENT MEDICATIONS STARTED DURING THIS VISIT:  New Prescriptions   No medications on file    Note:  This note was prepared with assistance of Dragon voice recognition software. Occasional wrong-word or sound-a-like substitutions may have occurred due to the inherent limitations of voice recognition software.   Merrily Pew, MD 08/02/17 2106

## 2017-08-03 ENCOUNTER — Inpatient Hospital Stay (HOSPITAL_COMMUNITY): Payer: Medicare Other

## 2017-08-03 LAB — URINALYSIS, ROUTINE W REFLEX MICROSCOPIC
Bacteria, UA: NONE SEEN
Bilirubin Urine: NEGATIVE
Glucose, UA: NEGATIVE mg/dL
Ketones, ur: NEGATIVE mg/dL
Leukocytes, UA: NEGATIVE
Nitrite: NEGATIVE
Protein, ur: NEGATIVE mg/dL
Specific Gravity, Urine: 1.006 (ref 1.005–1.030)
pH: 7 (ref 5.0–8.0)

## 2017-08-03 LAB — RAPID URINE DRUG SCREEN, HOSP PERFORMED
Amphetamines: NOT DETECTED
Benzodiazepines: NOT DETECTED
Cocaine: NOT DETECTED
Opiates: NOT DETECTED
Tetrahydrocannabinol: NOT DETECTED

## 2017-08-03 LAB — MRSA PCR SCREENING: MRSA by PCR: NEGATIVE

## 2017-08-03 MED ORDER — ACETAMINOPHEN 650 MG RE SUPP: 650.0000 mg | RECTAL | Status: DC | PRN

## 2017-08-03 MED ORDER — AMIODARONE HCL 200 MG PO TABS
100.0000 mg | ORAL_TABLET | Freq: Every day | ORAL | Status: DC
  Administered 2017-08-05 – 2017-08-06 (×2): 100 mg via ORAL
  Filled 2017-08-03 (×2): qty 1

## 2017-08-03 MED ORDER — ORAL CARE MOUTH RINSE
15.0000 mL | Freq: Two times a day (BID) | OROMUCOSAL | Status: DC
  Administered 2017-08-03 – 2017-08-04 (×4): 15 mL via OROMUCOSAL

## 2017-08-03 MED ORDER — FAMOTIDINE IN NACL 20-0.9 MG/50ML-% IV SOLN
20.0000 mg | Freq: Two times a day (BID) | INTRAVENOUS | Status: DC
  Administered 2017-08-03 – 2017-08-06 (×7): 20 mg via INTRAVENOUS
  Filled 2017-08-03 (×8): qty 50

## 2017-08-03 MED ORDER — LORAZEPAM 2 MG/ML IJ SOLN: 1.0000 mg | Freq: Once | INTRAMUSCULAR | Status: DC

## 2017-08-03 MED ORDER — SODIUM CHLORIDE 0.9 % IV SOLN
INTRAVENOUS | Status: DC
  Administered 2017-08-03 – 2017-08-05 (×5): via INTRAVENOUS

## 2017-08-03 MED ORDER — TRAZODONE 25 MG HALF TABLET
25.0000 mg | ORAL_TABLET | Freq: Every evening | ORAL | Status: DC | PRN
  Administered 2017-08-04: 25 mg via ORAL
  Filled 2017-08-03: qty 2
  Filled 2017-08-03: qty 1
  Filled 2017-08-03: qty 2

## 2017-08-03 MED ORDER — LABETALOL HCL 5 MG/ML IV SOLN: 20.0000 mg | Freq: Once | INTRAVENOUS | Status: DC

## 2017-08-03 MED ORDER — CARVEDILOL 12.5 MG PO TABS
12.5000 mg | ORAL_TABLET | Freq: Two times a day (BID) | ORAL | Status: DC
  Administered 2017-08-04 – 2017-08-06 (×4): 12.5 mg via ORAL
  Filled 2017-08-03 (×4): qty 1

## 2017-08-03 MED ORDER — LEVOTHYROXINE SODIUM 75 MCG PO TABS
75.0000 ug | ORAL_TABLET | Freq: Every day | ORAL | Status: DC
  Administered 2017-08-05 – 2017-08-06 (×2): 75 ug via ORAL
  Filled 2017-08-03 (×2): qty 1

## 2017-08-03 MED ORDER — ACETAMINOPHEN 160 MG/5ML PO SOLN: 650.0000 mg | ORAL | Status: DC | PRN

## 2017-08-03 MED ORDER — STROKE: EARLY STAGES OF RECOVERY BOOK
Freq: Once | Status: AC
  Administered 2017-08-03: 01:00:00
  Filled 2017-08-03: qty 1

## 2017-08-03 MED ORDER — CLEVIDIPINE BUTYRATE 0.5 MG/ML IV EMUL: 0.0000 mg/h | INTRAVENOUS | Status: DC

## 2017-08-03 MED ORDER — SENNOSIDES-DOCUSATE SODIUM 8.6-50 MG PO TABS
1.0000 | ORAL_TABLET | Freq: Two times a day (BID) | ORAL | Status: DC
  Administered 2017-08-04 – 2017-08-06 (×3): 1 via ORAL
  Filled 2017-08-03 (×3): qty 1

## 2017-08-03 MED ORDER — ACETAMINOPHEN 325 MG PO TABS: 650.0000 mg | ORAL_TABLET | ORAL | Status: DC | PRN

## 2017-08-03 NOTE — Progress Notes (Signed)
OT Cancellation Note  Patient Details Name: Sara Russell MRN: [id number] DOB: May 14, 1930   Cancelled Treatment:    Reason Eval/Treat Not Completed: Active bedrest order. Will check back as appropriate.   Norman Herrlich, MS OTR/L  Pager: [phone]   Norman Herrlich 08/03/2017, 6:52 AM

## 2017-08-03 NOTE — Progress Notes (Signed)
STROKE TEAM PROGRESS NOTE   HISTORY OF PRESENT ILLNESS (per record) Sara Russell is an 84 y.o. female who presents via EMS to the ED with sudden onset of right facial droop, right sided weakness and aphasia. LKN was 1800, the same as TOSO. She sat down for dinner and then was noted to be "having a stroke" by companions at the table, with the above symptoms. BP prior to arrival was 221/89; CBG 86; 97% on RA; bilateral 18G were placed.   The patient is on Eliquis for a-fib. Other stroke risk factors are HLD and HTN. She has a history of DVT and PE as well.       SUBJECTIVE (INTERVAL HISTORY) The patient's son and son-in-law were at the bedside.  The patient was unable to answer any questions at this time.  She currently is not following commands; however, the nurse reported that earlier she was able to speak and follow commands.  Dr. Leonie Man spoke with the family regarding the patient's poor prognosis.Marland Kitchen Her blood pressure is well controlled. MRI scan shows stable appearance of the left thalamic hemorrhage as well as 2 other small left frontal and left parieto-occipital subacute ischemic strokes    OBJECTIVE Temp:  [97.6 F (36.4 C)-98.7 F (37.1 C)] 97.9 F (36.6 C) (06/23 0740) Pulse Rate:  [64-95] 67 (06/23 0830) Cardiac Rhythm: Normal sinus rhythm (06/23 0800) Resp:  [15-30] 30 (06/23 0830) BP: (88-181)/(42-157) 133/60 (06/23 0830) SpO2:  [94 %-100 %] 97 % (06/23 0830) Weight:  [154 lb 8.7 oz (70.1 kg)-155 lb 13.8 oz (70.7 kg)] 154 lb 8.7 oz (70.1 kg) (06/22 2145)  CBC:  Recent Labs  Lab 08/02/17 1907 08/02/17 1915  WBC 7.1  --   NEUTROABS 4.9  --   HGB 11.9* 12.2  HCT 36.6 36.0  MCV 95.8  --   PLT 204  --     Basic Metabolic Panel:  Recent Labs  Lab 08/02/17 1907 08/02/17 1915  NA 130* 131*  K 3.9 3.9  CL 98* 97*  CO2 20*  --   GLUCOSE 99 98  BUN 13 13  CREATININE 1.06* 1.10*  CALCIUM 8.8*  --     Lipid Panel:     Component Value Date/Time   CHOL 89  09/16/2014 0617   TRIG 180 (H) 09/16/2014 0617   HDL 13 (L) 09/16/2014 0617   CHOLHDL 6.8 09/16/2014 0617   VLDL 36 09/16/2014 0617   LDLCALC 40 09/16/2014 0617   HgbA1c:  Lab Results  Component Value Date   HGBA1C 5.3 08/02/2013   Urine Drug Screen: No results found for: LABOPIA, COCAINSCRNUR, LABBENZ, AMPHETMU, THCU, LABBARB  Alcohol Level     Component Value Date/Time   [facility] <10 08/02/2017 1907    IMAGING  Mr Brain Wo Contrast 08/03/2017 IMPRESSION:  1. Similar size of acute left thalamic intraparenchymal hemorrhage, measuring 1.9 x 2.0 x 2.5 cm. Mildly increased localized edema with trace left-to-right midline shift. No intraventricular extension, hydrocephalus, or other complication identified. No underlying mass or other abnormality.  2. Two subcentimeter subacute ischemic cortical infarcts involving the left frontal and parietooccipital regions as above.  3. Additional scattered chronic micro hemorrhages involving the cerebral and cerebellar hemispheres, favored to be secondary to chronic uncontrolled hypertension.  4. Atrophy with chronic microvascular ischemic disease.   Dg Pelvis Portable 08/03/2017 IMPRESSION:  No acute osseous abnormality.   Dg Femur [place], [place] 2 Views Right 08/03/2017 IMPRESSION:  1. No acute osseous abnormality.  2. Mild irregularity of  the right inferior pubic ramus is questionable for fracture.   Ct Head Code Stroke Wo Contrast 08/02/2017   IMPRESSION:  1. Acute hemorrhage centered within left thalamus/posterior limb of internal capsule measuring up to 1.9 cm, 2.5 cc. Mild local mass effect.  2. Stable chronic microvascular ischemic changes and parenchymal volume loss of the brain.  3. ASPECTS is not applicable.    CT Head Wo Contrast - pending for this evening per Dr Cheral Marker 08/03/2017   Transthoracic Echocardiogram - pending 00/00/00   Bilateral Carotid Dopplers - pending 00/00/00    PHYSICAL EXAM Vitals:   08/03/17 0740  08/03/17 0800 08/03/17 0815 08/03/17 0830  BP:  125/61 (!) 145/69 133/60  Pulse:  72 69 67  Resp:  (!) 22 17 (!) 30  Temp: 97.9 F (36.6 C)     TempSrc: Axillary     SpO2:  94% 98% 97%  Weight:      Height:       Frail elderly Caucasian lady currently not in distress. . Afebrile. Head is nontraumatic. Neck is supple without bruit.    Cardiac exam no murmur or gallop. Lungs are clear to auscultation. Distal pulses are well felt. .Neurological Exam :  Patient is drowsy but can partially open eyes to stimulation.She does does not speak or follow any commands for me but apparently did follow commands for the nose earlier. She has left gaze preference and does not cross to the right past midline. Pupils small sluggishly reactive. Fundi could not be visualized. Mild right lower facial weakness. Tongue midline. Dense right hemiplegia but will withdraw lower extremity briskly to pain but not the right upper extremity. Purposeful antigravity movements in the left side. Tone is reduced in the right and normal on the left. Right sided reflexes are depressed left-sided of preserved. Left plantar downgoing right upgoing. Gait not tested.   ASSESSMENT/PLAN [name] is a 84 y.o. female with history of atrial fibrillation (Eliquis PTA), congestive heart failure, pulmonary embolism hx, hypothyroidism, hypertension, hyperlipidemia, gastroesophageal reflux disease, DVT history, and history of an abdominal mass presenting with sudden onset of right facial droop, right-sided weakness, and aphasia. She did not receive IV t-PA due to [place]  Hemorrhagic stroke  Resultant  Aphasia and right hemiplegia  CT head - Acute hemorrhage centered within left thalamus/posterior limb of internal capsule measuring up to 1.9 cm, 2.5 cc.  MRI head -  Similar size of acute left thalamic intraparenchymal hemorrhage, measuring 1.9 x 2.0 x 2.5 cm. Mildly increased localized edema with trace left-to-right midline shift.. Two  subcentimeter subacute ischemic cortical infarcts involving the left frontal and parietooccipital regions  MRA head - not performed  Repeat CT Head - pending  Carotid Doppler - pending  2D Echo - pending  LDL - not performed  HgbA1c - 5.3  VTE prophylaxis - SCDs Diet Order           Diet NPO time specified  Diet effective now          Eliquis (apixaban) daily prior to admission, now on No antithrombotic  Ongoing aggressive stroke risk factor management  Therapy recommendations:  pending  Disposition:  Pending  Hypertension  Stable  On Cleviprex . Long-term BP goal normotensive  Hyperlipidemia  Lipid lowering medication PTA: Lipitor 10 mg daily  LDL - not performed, goal < 70  Current lipid lowering medication: NPO  Continue statin at discharge    Other Stroke Risk Factors  Advanced age  Former cigarette smoker -  quit > 50 years ago  ETOH use, advised to drink no more than 1 alcoholic beverage per day.  Hx stroke/TIA by mother imaging  Family hx stroke (mother)  Atrial fibrillation  Other Active Problems  Mild hyponatremia  Mildly elevated creatinine  CHF history   PLAN  IV fluids while NPO  Palliative care consult  Repeat CT head tonight per Dr. Otila Back day # [place] PA-C [facility] Pager [phone] 08/03/2017, 12:25 PM I have personally examined this patient, reviewed notes, independently viewed imaging studies, participated in medical decision making and plan of care.ROS completed by me personally and pertinent positives fully documented  I have made any additions or clarifications directly to the above note. Agree with note above. She has presented with headache and aphasia and right hemiplegia due to left thalamic hemorrhage likely due to embolic stroke while on anticoagulation. MRI also shows probably silent subacute left frontal and left parieto-occipital infarct as well. Patient remains  at risk for neurological worsening due to hematoma expansion, recurrent strokes since development of cerebral edema. I had a long discussion of the bedside with the patient's son and son-in-law and answered questions about her care. They're quite clear that the patient would want DO NOT RESUSCITATE and not aggressive measures to reduce intracranial pressure. They would also not favor patient having a feeding tube. Recommend strict blood pressure control, close neurological monitoring and keep nothing by mouth for now. Reassess mental status and swallowing ability in the morning. Repeat CT scan of the head in the morning. Hold anticoagulation due to intracerebral hemorrhage but patient is very high risk for recurrent DVT, pulmonary embolism and ischemic strokes.This patient is critically ill and at significant risk of neurological worsening, death and care requires constant monitoring of vital signs, hemodynamics,respiratory and cardiac monitoring, extensive review of multiple databases, frequent neurological assessment, discussion with family, other specialists and medical decision making of high complexity.I have made any additions or clarifications directly to the above note.This critical care time does not reflect procedure time, or teaching time or supervisory time of PA/NP/Med Resident etc but could involve care discussion time.  I spent 50 minutes of neurocritical care time  in the care of  this patient.     Antony Contras, MD Medical Director [facility] Pager: [phone] 08/03/2017 1:09 PM   To contact Stroke Continuity provider, please refer to [email or web address] After hours, contact General Neurology

## 2017-08-03 NOTE — Progress Notes (Signed)
SLP Cancellation Note  Patient Details Name: Sara Russell MRN: [id number] DOB: 1930/05/20   Cancelled treatment:       Reason Eval/Treat Not Completed: Fatigue/lethargy limiting ability to participate. Per RN pt with fluctuating mentation, too lethargic for assessment presently. RN to page if pt more alert.  Deneise Lever, [place], CCC-SLP Speech-Language Pathologist [phone]    Aliene Altes 08/03/2017, 10:31 AM

## 2017-08-03 NOTE — Progress Notes (Signed)
PT Cancellation Note  Patient Details Name: Sara Russell MRN: [id number] DOB: March 20, 1930   Cancelled Treatment:    Reason Eval/Treat Not Completed: Active bedrest order   Will follow up later today as time allows;  Otherwise, will follow up for PT as appropriate;   Thank you,  Roney Marion, PT  Acute Rehabilitation Services Pager [phone] Office [phone]     [place] 08/03/2017, 10:06 AM

## 2017-08-04 ENCOUNTER — Inpatient Hospital Stay (HOSPITAL_COMMUNITY): Payer: Medicare Other

## 2017-08-04 DIAGNOSIS — N183 Chronic kidney disease, stage 3 unspecified: Secondary | ICD-10-CM

## 2017-08-04 DIAGNOSIS — E871 Hypo-osmolality and hyponatremia: Secondary | ICD-10-CM

## 2017-08-04 DIAGNOSIS — I480 Paroxysmal atrial fibrillation: Secondary | ICD-10-CM

## 2017-08-04 DIAGNOSIS — I619 Nontraumatic intracerebral hemorrhage, unspecified: Secondary | ICD-10-CM

## 2017-08-04 DIAGNOSIS — I361 Nonrheumatic tricuspid (valve) insufficiency: Secondary | ICD-10-CM

## 2017-08-04 DIAGNOSIS — I10 Essential (primary) hypertension: Secondary | ICD-10-CM

## 2017-08-04 LAB — ECHOCARDIOGRAM COMPLETE
Height: 64 in
Weight: 2472.68 oz

## 2017-08-04 MED ORDER — SIMETHICONE 80 MG PO CHEW
80.0000 mg | CHEWABLE_TABLET | Freq: Four times a day (QID) | ORAL | Status: DC | PRN
  Administered 2017-08-04: 80 mg via ORAL
  Filled 2017-08-04: qty 1

## 2017-08-04 MED ORDER — LABETALOL HCL 5 MG/ML IV SOLN: 10.0000 mg | INTRAVENOUS | Status: DC | PRN

## 2017-08-04 NOTE — Evaluation (Signed)
Speech Language Pathology Evaluation Patient Details Name: Sara Russell MRN: [id number] DOB: October 23, 1930 Today's Date: 08/04/2017 Time: 2694-8546 SLP Time Calculation (min) (ACUTE ONLY): 27 min  Problem List:  Patient Active Problem List   Diagnosis Date Noted  . ICH (intracerebral hemorrhage) ([place]) 08/03/2017  . Hemorrhagic stroke ([place]) 08/02/2017  . At high risk for falls 02/12/2017  . Insomnia 02/12/2017  . Pulmonary nodules/lesions, multiple 01/29/2017  . Acute pain of right shoulder 01/28/2017  . Hypothyroidism 04/04/2015  . Compression fracture of L1 lumbar vertebra (HCC) 02/23/2015  . Cholelithiases 02/23/2015  . Encounter for palliative care   . Chronic diastolic congestive heart failure ([place])   . Osteoporosis 05/13/2014  . Gout 08/16/2013  . Hyponatremia 08/10/2013  . Left atrial enlargement 01/10/2013  . A-fib ([place]) 12/08/2012  . Rosacea 05/15/2012  . Anemia due to chronic blood loss 08/28/2010  . DEGENERATIVE DISC DISEASE, LUMBAR SPINE 07/14/2009  . GERD 07/11/2009  . PERSONAL HX COLONIC POLYPS 07/11/2009  . HYPERCHOLESTEROLEMIA 04/10/2006  . HYPERTENSION, BENIGN SYSTEMIC 04/10/2006  . History of pulmonary embolism 04/10/2006  . DIVERTICULOSIS OF COLON 04/10/2006  . ACTINIC KERATOSIS 04/10/2006  . GEN OSTEOARTHROSIS INVOLVING MULTIPLE SITES 04/10/2006   Past Medical History:  Past Medical History:  Diagnosis Date  . Abdominal mass   . AKI (acute kidney injury) ([place]) 09/15/2014  . Atrial fibrillation ([place])   . Colitis, ulcerative ([place])   . Cough    WHITE SPUTUM FOR 2 WEEKS, NO FEVER  . Degenerative joint disease   . Diverticulosis   . DVT (deep venous thrombosis) ([place]) 2009   BOTH LUNGS  . GERD (gastroesophageal reflux disease)   . Hyperlipidemia   . Hyperplastic colon polyp   . Hypertension   . Hypothyroidism   . Iron deficiency anemia    ON IRON AT TIMES  . PE (pulmonary embolism) 2009  . Persistent atrial fibrillation ([place])   . Positive H.  pylori test 01/20/96  . S/P dilatation of esophageal stricture 1992  . Skin cancer    h/o basal and squamous skin cancer--> removed   Past Surgical History:  Past Surgical History:  Procedure Laterality Date  . APPENDECTOMY    . BREAST EXCISIONAL BIOPSY Left 1992   benign  . BREAST LUMPECTOMY     benign  . DILATION AND CURETTAGE OF UTERUS     for endometrial polyps  . ENDOSCOPIC RETROGRADE CHOLANGIOPANCREATOGRAPHY (ERCP) WITH PROPOFOL N/A 03/23/2015   Procedure: ENDOSCOPIC RETROGRADE CHOLANGIOPANCREATOGRAPHY (ERCP) WITH PROPOFOL;  Surgeon: Milus Banister, MD;  Location: [facility];  Service: Endoscopy;  Laterality: N/A;  . JOINT REPLACEMENT    . ORIF ANKLE FRACTURE Left    X 2  . REPLACEMENT TOTAL KNEE     left  . SURGERY DONE TO REMOVE LEFT ANKLE HARDWARE LATER    . TONSILLECTOMY     HPI:  Patient is an 84 year old female admitted with acute left thalamus/IC/basal ganglia hemorrhage.  PMH positive for a-fib, DVT/PE, HLD, DJD, HTN, skin CA and osteoporosis   Assessment / Plan / Recommendation Clinical Impression  Pt demosntrates a moderate to severe expressive and receptive aphasia with fluent speech, intact repetiton, relatively fluent reading ability without comprehension, but poor auditory comprehension and naming, most consistent with a transcortical sensory aphasia. Pt was stimulable for visual cues to follow commands and carrier phrases to find words. Introduced therapeutic concepts and diagnosis of general aphasia to family members. Pt demonstrates good rehabilitation potential with intensive intervention; recommend CIR at d/c.  SLP Assessment  SLP Recommendation/Assessment: Patient needs continued Speech Lanaguage Pathology Services SLP Visit Diagnosis: Aphasia (R47.01)    Follow Up Recommendations  Inpatient Rehab    Frequency and Duration min 2x/week  2 weeks      SLP Evaluation Cognition  Overall Cognitive Status: Difficult to assess Arousal/Alertness:  Awake/alert Orientation Level: Oriented to person;Disoriented to place Attention: Focused;Sustained Focused Attention: Appears intact Sustained Attention: Appears intact Problem Solving: Impaired Problem Solving Impairment: Verbal basic Behaviors: Perseveration       Comprehension  Auditory Comprehension Overall Auditory Comprehension: Impaired Yes/No Questions: Impaired Basic Biographical Questions: 0-25% accurate Commands: Impaired One Step Basic Commands: 0-24% accurate Conversation: Simple Visual Recognition/Discrimination Discrimination: Exceptions to [facility] Common Objects: Unable to indentify Pictures: Unable to indentify Reading Comprehension Reading Status: Impaired Word level: Within functional limits Sentence Level: Impaired Paragraph Level: Impaired Functional Environmental (signs, name badge): Within functional limits Interfering Components: Visual acuity    Expression Verbal Expression Overall Verbal Expression: Impaired Initiation: No impairment Automatic Speech: Name;Social Response Level of Generative/Spontaneous Verbalization: Sentence Repetition: No impairment Naming: Impairment Responsive: Not tested Confrontation: Impaired Common Objects: Unable to indentify Pictures: Unable to indentify Convergent: Not tested Divergent: Not tested Verbal Errors: Semantic paraphasias;Neologisms;Perseveration;Not aware of errors Written Expression Dominant Hand: Right   Oral / Motor  Motor Speech Overall Motor Speech: Appears within functional limits for tasks assessed   GO                   Herbie Baltimore, MA CCC-SLP [phone]  Lynann Beaver 08/04/2017, 1:55 PM

## 2017-08-04 NOTE — Evaluation (Signed)
Physical Therapy Evaluation Patient Details Name: ABBEE Russell MRN: [id number] DOB: [birth year] Today's Date: 08/04/2017   History of Present Illness  Patient is an 84 year old female admitted with acute left thalamus/IC/basal ganglia hemorrhage.  PMH positive for a-fib, DVT/PE, HLD, DJD, HTN, skin CA and osteoporosis.  Clinical Impression  Patient presents with decreased independence with mobility due to deficits listed in PT problem list.  She will benefit from skilled PT in the acute setting to allow return to ILF following CIR level rehab.  Currently +2 mod A for OOB to BSC/chair and with expressive aphasia, but follows commands well.  Family supportive and interested in pursuing CIR level rehab.      Follow Up Recommendations CIR    Equipment Recommendations  Other (comment)(TBA)    Recommendations for Other Services Rehab consult     Precautions / Restrictions Precautions Precautions: Fall Precaution Comments: watch BP  Restrictions Weight Bearing Restrictions: No      Mobility  Bed Mobility Overal bed mobility: Needs Assistance Bed Mobility: Supine to Sit     Supine to sit: Mod assist;HOB elevated;+2 for physical assistance     General bed mobility comments: assist to initiate with legs off bed and assist for lifting trunk  Transfers Overall transfer level: Needs assistance Equipment used: Rolling walker (2 wheeled) Transfers: Sit to/from [facility] Sit to Stand: Mod assist;+2 safety/equipment Stand pivot transfers: Mod assist;+2 physical assistance       General transfer comment: lifting assist from EOB and assist for hand placement on walker, able to stand and step to BSC leaning to R and assist for R LE at times due to foot placement; stand and step to recliner mod cues, +2 for balance, safety, walker management  Ambulation/Gait                Stairs            Wheelchair Mobility    Modified Rankin (Stroke Patients  Only) Modified Rankin (Stroke Patients Only) Pre-Morbid Rankin Score: Moderate disability Modified Rankin: Moderately severe disability     Balance Overall balance assessment: Needs assistance;History of Falls Sitting-balance support: Feet supported Sitting balance-Leahy Scale: Fair   Postural control: Right lateral lean Standing balance support: Bilateral upper extremity supported Standing balance-Leahy Scale: Poor Standing balance comment: standing leans to R                              Pertinent Vitals/Pain Pain Assessment: Faces Faces Pain Scale: Hurts little more Pain Location: R LE with elevation at hip Pain Descriptors / Indicators: Grimacing Pain Intervention(s): Monitored during session;Repositioned    Home Living                        Prior Function                 Hand Dominance        Extremity/Trunk Assessment                Communication      Cognition Arousal/Alertness: Awake/alert Behavior During Therapy: Flat affect Overall Cognitive Status: Difficult to assess                                 General Comments: expressive aphasia; follows commands with mild delay      General Comments General comments (  skin integrity, edema, etc.): son and daughter in room and reporting prior functional level, interested in inpatient rehab for her to return hopefully to ILF    Exercises     Assessment/Plan    PT Assessment    PT Problem List         PT Treatment Interventions      PT Goals (Current goals can be found in the Care Plan section)  Acute Rehab PT Goals Patient Stated Goal: to go to rehab PT Goal Formulation: With patient/family Time For Goal Achievement: 08/18/17 Potential to Achieve Goals: Good    Frequency Min 4X/week   Barriers to discharge        Co-evaluation PT/OT/SLP Co-Evaluation/Treatment: Yes Reason for Co-Treatment: Complexity of the patient's impairments (multi-system  involvement);For patient/therapist safety PT goals addressed during session: Mobility/safety with mobility;Balance;Proper use of DME         AM-PAC PT "6 Clicks" Daily Activity  Outcome Measure Difficulty turning over in bed (including adjusting bedclothes, sheets and blankets)?: A Lot Difficulty moving from lying on back to sitting on the side of the bed? : Unable Difficulty sitting down on and standing up from a chair with arms (e.g., wheelchair, bedside commode, etc,.)?: Unable Help needed moving to and from a bed to chair (including a wheelchair)?: A Lot Help needed walking in hospital room?: A Lot Help needed climbing 3-5 steps with a railing? : Total 6 Click Score: 9    End of Session Equipment Utilized During Treatment: Gait belt Activity Tolerance: Patient tolerated treatment well Patient left: with call bell/phone within reach;in chair;with chair alarm set;with family/visitor present Nurse Communication: Mobility status PT Visit Diagnosis: Other abnormalities of gait and mobility (R26.89);Hemiplegia and hemiparesis Hemiplegia - Right/Left: Right Hemiplegia - dominant/non-dominant: Dominant Hemiplegia - caused by: Nontraumatic intracerebral hemorrhage    Time: 1000-1036 PT Time Calculation (min) (ACUTE ONLY): 36 min   Charges:   PT Evaluation $PT Eval Moderate Complexity: 1 Mod     PT G CodesMagda Russell, [place] [phone] 08/04/2017   Sara Russell 08/04/2017, 11:43 AM
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
## 2017-08-04 NOTE — Progress Notes (Addendum)
STROKE TEAM PROGRESS NOTE   HISTORY OF PRESENT ILLNESS (per record) Sara Russell is an 84 y.o. female who presents via EMS to the ED with sudden onset of right facial droop, right sided weakness and aphasia. LKN was 1800, the same as TOSO. She sat down for dinner and then was noted to be "having a stroke" by companions at the table, with the above symptoms. BP prior to arrival was 221/89; CBG 86; 97% on RA; bilateral 18G were placed.   The patient is on Eliquis for a-fib. Other stroke risk factors are HLD and HTN. She has a history of DVT and PE as well.    SUBJECTIVE (INTERVAL HISTORY) The patient's family are all at the bedside with Dr. Andria Frames. Pt more awake, interactive. Still with expressive and receptive aphasia. Dr. Leonie Man feels she will be able to swallow. Family is relieved. Will test today. Follow-up CT scan shows stable appearance of the thalamic hematoma and mass effect no hydrocephalus. Blood pressure has been adequately controlled   OBJECTIVE Temp:  [97.7 F (36.5 C)-98.5 F (36.9 C)] 98.2 F (36.8 C) (06/24 0800) Pulse Rate:  [52-78] 67 (06/24 0830) Cardiac Rhythm: Atrial fibrillation (06/24 0800) Resp:  [14-32] 20 (06/24 0830) BP: (95-164)/(43-109) 144/64 (06/24 0830) SpO2:  [92 %-100 %] 97 % (06/24 0830)  CBC:  Recent Labs  Lab 08/02/17 1907 08/02/17 1915  WBC 7.1  --   NEUTROABS 4.9  --   HGB 11.9* 12.2  HCT 36.6 36.0  MCV 95.8  --   PLT 204  --     Basic Metabolic Panel:  Recent Labs  Lab 08/02/17 1907 08/02/17 1915  NA 130* 131*  K 3.9 3.9  CL 98* 97*  CO2 20*  --   GLUCOSE 99 98  BUN 13 13  CREATININE 1.06* 1.10*  CALCIUM 8.8*  --     Lipid Panel:     Component Value Date/Time   CHOL 89 09/16/2014 0617   TRIG 180 (H) 09/16/2014 0617   HDL 13 (L) 09/16/2014 0617   CHOLHDL 6.8 09/16/2014 0617   VLDL 36 09/16/2014 0617   LDLCALC 40 09/16/2014 0617   HgbA1c:  Lab Results  Component Value Date   HGBA1C 5.3 08/02/2013   Urine Drug  Screen:     Component Value Date/Time   LABOPIA NONE DETECTED 08/02/2017 1418   COCAINSCRNUR NONE DETECTED 08/02/2017 1418   LABBENZ NONE DETECTED 08/02/2017 1418   AMPHETMU NONE DETECTED 08/02/2017 1418   THCU NONE DETECTED 08/02/2017 1418   LABBARB (A) 08/02/2017 1418    Result not available. Reagent lot number recalled by manufacturer.    Alcohol Level     Component Value Date/Time   ETH <10 08/02/2017 1907    IMAGING  Ct Head Code Stroke Wo Contrast 08/02/2017   IMPRESSION:  1. Acute hemorrhage centered within left thalamus/posterior limb of internal capsule measuring up to 1.9 cm, 2.5 cc. Mild local mass effect.  2. Stable chronic microvascular ischemic changes and parenchymal volume loss of the brain.  3. ASPECTS is not applicable.   Mr Brain Wo Contrast 08/03/2017 IMPRESSION:  1. Similar size of acute left thalamic intraparenchymal hemorrhage, measuring 1.9 x 2.0 x 2.5 cm. Mildly increased localized edema with trace left-to-right midline shift. No intraventricular extension, hydrocephalus, or other complication identified. No underlying mass or other abnormality.  2. Two subcentimeter subacute ischemic cortical infarcts involving the left frontal and parietooccipital regions as above.  3. Additional scattered chronic micro hemorrhages involving the  cerebral and cerebellar hemispheres, favored to be secondary to chronic uncontrolled hypertension.  4. Atrophy with chronic microvascular ischemic disease.   CT Head Wo Contrast  08/03/2017 2043 1. Stable acute hemorrhage centered within left thalamus. Mild increase in edema and mass effect from 08/02/2017 with 4 mm left-to-right midline shift, previously 3 mm. No herniation. 2. No new acute intracranial abnormality. 3. Stable chronic microvascular ischemic changes and parenchymal volume loss of the brain.   Dg Pelvis Portable 08/03/2017 IMPRESSION:  No acute osseous abnormality.   Dg Femur [place], [place] 2 Views  Right 08/03/2017 IMPRESSION:  1. No acute osseous abnormality.  2. Mild irregularity of the right inferior pubic ramus is questionable for fracture.   Transthoracic Echocardiogram - pending 00/00/00  Bilateral Carotid Dopplers - pending 00/00/00   PHYSICAL EXAM   Frail elderly Caucasian lady currently not in distress. . Afebrile. Head is nontraumatic. Neck is supple without bruit.    Cardiac exam no murmur or gallop. Lungs are clear to auscultation. Distal pulses are well felt. Neurological Exam :  Patient is awake .She does  speak  But has mild mixed expressive and receptive aphasia with some word hesitancy and trouble understanding two-step and complex commands  . She has left gaze preference and does   cross to the right past midline. Pupils small sluggishly reactive. Fundi could not be visualized. Mild right lower facial weakness. Tongue midline.mild right hemiplegia 3/5 strength with weakness of right grip. Normal strength on thethe left side. Tone is reduced in the right and normal on  Left side. Right sided reflexes are depressed left-sided of preserved. Left plantar downgoing right upgoing. Gait not tested.   ASSESSMENT/PLAN Sara Russell is a 84 y.o. female with history of atrial fibrillation (Eliquis PTA), congestive heart failure, pulmonary embolism hx, hypothyroidism, hypertension, hyperlipidemia, gastroesophageal reflux disease, DVT history, and history of an abdominal mass presenting with sudden onset of right facial droop, right-sided weakness, and aphasia. She did not receive IV t-PA due to [place]  Hemorrhagic stroke  Resultant  Aphasia and right hemiplegia  CT head - Acute hemorrhage centered within left thalamus/posterior limb of internal capsule measuring up to 1.9 cm, 2.5 cc.  MRI head -  Similar size of acute left thalamic intraparenchymal hemorrhage, measuring 1.9 x 2.0 x 2.5 cm. Mildly increased localized edema with trace left-to-right midline shift.. Two  subcentimeter subacute ischemic cortical infarcts involving the left frontal and parietooccipital regions  Repeat CT Head stable  Carotid Doppler - pending  2D Echo - pending  LDL - not performed  HgbA1c - 5.3  VTE prophylaxis - SCDs  Eliquis (apixaban) daily prior to admission, now on No antithrombotic  Ongoing aggressive stroke risk factor management  Therapy recommendations:  CIR. Will place consult  Disposition:  Pending (at [facility] independent living)  Atrial Fibrillation  Home anticoagulation:  Eliquis (apixaban) daily   Off AC d/t hmg  Can consider Watchman device in the future if desired   Hypertension  Stable  On Cleviprex  BP goal < 140. Increase goal to < 180 . Long-term BP goal normotensive  Hyperlipidemia  Lipid lowering medication PTA: Lipitor 10 mg daily  LDL - not performed, goal < 70  Current lipid lowering medication:  none, NPO  Resume statin at discharge  Dysphagia, secondary to stroke Diet Order           Diet NPO time specified  Diet effective now         For SLP swallow assessment today  Diet per SLP  Other Stroke Risk Factors  Advanced age  Former cigarette smoker - quit > 50 years ago  ETOH use, advised to drink no more than 1 alcoholic beverage per day.  Hx stroke/TIA by mother imaging  Family hx stroke (mother)  CHF history  Other Active Problems  Mild hyponatremia  Mildly elevated creatinine 1.1  PLAN  Check swallow  Increase BP goal  Consider xfer out of ICU once off BP drip  Hospital day # 2  Burnetta Sabin, MSN, APRN, ANVP-BC, AGPCNP-BC Advanced Practice Stroke Nurse [place] for Schedule & Pager information 08/04/2017 9:26 AM  I have personally examined this patient, reviewed notes, independently viewed imaging studies, participated in medical decision making and plan of care.ROS completed by me personally and pertinent positives fully documented  I have made any  additions or clarifications directly to the above note. Agree with note above.  patient's thalamic hemorrhage appears to be stable. She is showing neurological improvement. Recommend strict blood pressure control with systolic blood pressure goal below 180. Resume home medications except eliquis. Mobilize out of bed and therapy consults. Speech therapy to do a swallow eval. May transfer later today to neurology floor bed. Patient likely need transfer to rehabilitation. Long discussion of the bedside with the patient's son and son-in-law and answered questions about her care. She is at high risk for recurrent strokes but cannot be on anticoagulation given large acute  intracranial hemorrhage. May start aspirin in a few days prior to discharge.This patient is critically ill and at significant risk of neurological worsening, death and care requires constant monitoring of vital signs, hemodynamics,respiratory and cardiac monitoring, extensive review of multiple databases, frequent neurological assessment, discussion with family, other specialists and medical decision making of high complexity.I have made any additions or clarifications directly to the above note.This critical care time does not reflect procedure time, or teaching time or supervisory time of PA/NP/Med Resident etc but could involve care discussion time.  I spent 32 minutes of neurocritical care time  in the care of  this patient.      Antony Contras, MD Medical Director [facility] Pager: [phone] 08/04/2017 2:55 PM   To contact Stroke Continuity provider, please refer to [email or web address] After hours, contact General Neurology

## 2017-08-04 NOTE — Consult Note (Signed)
Consultation Note Date: 08/04/2017   Patient Name: Sara Russell  DOB: [birth year]  MRN: [id number]  Age / Sex: 84 y.o., female  PCP: Zenia Resides, MD Referring Physician: Garvin Fila, MD  Reason for Consultation: Establishing goals of care  HPI/Patient Profile: Sara Russell is an 84 y.o. female who presents via EMS to the ED with sudden onset of right facial droop, right sided weakness and aphasia. She was admitted for CVA.    Clinical Assessment and Goals of Care: Patient is resting in bed. She is alert. She is able to state her name. Any questions beyond are answered incorrectly (place, event, number of children). She denies complaint.   Spoke with son [name]. He states he has been advised that his mother is very ill and there is concern for prognosis. He states she has 3 sons, but he is the son who lives in the area and helps her. 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 He states in the past she was "coded". She stated she would never want that done again. We discussed the MOST form that has been scanned requesting 20 minutes of CPR. We discussed the decisions for: CPR for 20 minutes, defibrillation/cardioversion, no ACLS medications, no intubation. We discussed ACLS and CPR. He states he needs to speak with his brothers.   He states she would never want dialysis or a feeding tube.      SUMMARY OF RECOMMENDATIONS    Plans for a family meeting tomorrow at 11:30 for a family meeting.   Code Status/Advance Care Planning:  Limited code    Symptom Management:   Per primary team.   Palliative Prophylaxis:   Eye Care and Oral Care   Prognosis:   Unable to determine  Discharge Planning: To Be Determined      Primary Diagnoses: Present on Admission: . Hemorrhagic stroke ([place]) . ICH (intracerebral hemorrhage) ([place])   I have reviewed the medical record, interviewed the  patient and family, and examined the patient. The following aspects are pertinent.  Past Medical History:  Diagnosis Date  . Abdominal mass   . AKI (acute kidney injury) ([place]) 09/15/2014  . Atrial fibrillation ([place])   . Colitis, ulcerative ([place])   . Cough    WHITE SPUTUM FOR 2 WEEKS, NO FEVER  . Degenerative joint disease   . Diverticulosis   . DVT (deep venous thrombosis) ([place]) 2009   BOTH LUNGS  . GERD (gastroesophageal reflux disease)   . Hyperlipidemia   . Hyperplastic colon polyp   . Hypertension   . Hypothyroidism   . Iron deficiency anemia    ON IRON AT TIMES  . PE (pulmonary embolism) 2009  . Persistent atrial fibrillation ([place])   . Positive H. pylori test 01/20/96  . S/P dilatation of esophageal stricture 1992  . Skin cancer    h/o basal and squamous skin cancer--> removed   Social History   Socioeconomic History  . Marital status: Widowed    Spouse name: Not on file  .  Number of children: 3  . Years of education: college  . Highest education level: Bachelor's degree (e.g., BA, AB, BS)  Occupational History  . Occupation: retired Optician, dispensing: RETIRED  Social Needs  . Financial resource strain: Not on file  . Food insecurity:    Worry: Never true    Inability: Not on file  . Transportation needs:    Medical: No    Non-medical: No  Tobacco Use  . Smoking status: Former Smoker    Packs/day: 0.50    Years: 13.00    Pack years: 6.50    Types: Cigarettes    Start date: 02/12/1948    Last attempt to quit: 02/11/1961    Years since quitting: 56.5  . Smokeless tobacco: Never Used  . Tobacco comment: Denies use of tobacco since 1963  Substance and Sexual Activity  . Alcohol use: Yes    Alcohol/week: 3.0 oz    Types: 5 Glasses of wine per week    Comment: WINE FEW TIMES PER WEEK  . Drug use: No  . Sexual activity: Never  Lifestyle  . Physical activity:    Days per week: Not on file    Minutes per session: Not on file  . Stress: Not on file    Relationships  . Social connections:    Talks on phone: Not on file    Gets together: Not on file    Attends religious service: Not on file    Active member of club or organization: Not on file    Attends meetings of clubs or organizations: Not on file    Relationship status: Not on file  Other Topics Concern  . Not on file  Social History Narrative   Health Care POA:    Emergency Contact: son, Ladon Heney (c) [phone] [name])   Diet: Pt has a varied diet of protein, starch, vegetables.   Exercise: Pt has no regular exercise plan because of pain in back.   Seatbelts: Pt reports wearing seatbelt when in vehicle.   Nancy Fetter Exposure/Protection: Pt reports wearing sun screen.   Hobbies: reading, traveling, eating out.      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 Silvano Rusk, RN, BSN  Family History  Problem Relation Age of Onset  . Esophageal cancer Brother   . Diverticulitis Brother   . Heart disease Mother   . Stroke Mother   . Dementia  Mother   . Heart attack Mother   . Heart disease Father   . Atrial fibrillation Father   . Breast cancer Maternal Aunt   . Colon cancer Neg Hx    Scheduled Meds: . amiodarone  100 mg Oral Daily  . carvedilol  12.5 mg Oral BID WC  . levothyroxine  75 mcg Oral QAC breakfast  . LORazepam  1 mg Intravenous Once  . mouth rinse  15 mL Mouth Rinse BID  . senna-docusate  1 tablet Oral BID   Continuous Infusions: . sodium chloride 75 mL/hr at 08/04/17 1400  . clevidipine 2 mg/hr (08/04/17 1400)  . famotidine (PEPCID) IV Stopped (08/04/17 1122)   PRN Meds:.acetaminophen **OR** acetaminophen (TYLENOL) oral liquid 160 mg/5 mL **OR** acetaminophen, labetalol, traZODone Medications Prior to Admission:  Prior to Admission medications   Medication Sig Start Date End Date Taking? Authorizing Provider  acetaminophen (TYLENOL ARTHRITIS PAIN) 650 MG CR tablet Take 650 mg by mouth every 8 (eight) hours as needed for pain.    Yes [provider]  amiodarone (PACERONE) 100 MG tablet Take 1 tablet (100 mg total) by mouth daily. 07/29/17  Yes Hensel, Jamal Collin, MD  apixaban (ELIQUIS) 2.5 MG TABS tablet Take 1 tablet (2.5 mg total) by mouth 2 (two) times daily. 07/29/17  Yes Hensel, Jamal Collin, MD  atorvastatin (LIPITOR) 10 MG tablet Take 1 tablet (10 mg total) by mouth daily. 07/29/17  Yes Hensel, Jamal Collin, MD  carvedilol (COREG) 12.5 MG tablet Take 1 tablet (12.5 mg total) by mouth 2 (two) times daily with a meal. 10/03/16  Yes Hensel, Jamal Collin, MD  diphenhydramine-acetaminophen (TYLENOL PM) 25-500 MG TABS tablet Take 1 tablet by mouth at bedtime as needed.   Yes [provider]  furosemide (LASIX) 20 MG tablet Take 1 tablet (20 mg total) by mouth every other day as needed for fluid or edema. 07/29/17  Yes Hensel, Jamal Collin, MD  Lactobacillus Rhamnosus, GG, (CULTURELLE PO) Take 1 Dose by mouth daily.   Yes [provider]  levothyroxine (SYNTHROID, LEVOTHROID) 75 MCG tablet Take 1  tablet (75 mcg total) by mouth daily. 07/29/17  Yes Hensel, Jamal Collin, MD  omeprazole (PRILOSEC) 40 MG capsule Take 1 capsule (40 mg total) by mouth daily. 10/03/16  Yes Hensel, Jamal Collin, MD  Simethicone (GAS RELIEF PO) Take 1 tablet by mouth 2 (two) times daily.   Yes [provider]  traZODone (DESYREL) 50 MG tablet Take 0.5-1 tablets (25-50 mg total) by mouth at bedtime as needed for sleep. 02/12/17   Zenia Resides, MD  omeprazole (PRILOSEC OTC) 20 MG tablet Take 20 mg by mouth 2 (two) times daily.    05/09/10  [provider]   Allergies  Allergen Reactions  . Iohexol Hives  . Ivp Dye [Iodinated Diagnostic Agents] Hives    Was able to tolerate Contrast Media for CT scan.  [name] [Shellfish Allergy] Nausea And Vomiting    Projectile vomiting  . Shellfish-Derived Products Nausea And Vomiting  . Sulfa Antibiotics Hives  . Sulfamethoxazole Hives   Review of Systems  Unable to perform ROS   Physical Exam  Constitutional: No distress.  Pulmonary/Chest: Effort normal.  Neurological: She is alert.    Vital Signs: BP (!) 171/75   Pulse 87  Temp 98.2 F (36.8 C) (Oral)   Resp (!) 27   Ht 5' 4"  (1.626 m)   Wt 70.1 kg (154 lb 8.7 oz)   SpO2 100%   BMI 26.53 kg/m  Pain Scale: 0-10   Pain Score: 0-No pain   SpO2: SpO2: 100 % O2 Device:SpO2: 100 % O2 Flow Rate: .   IO: Intake/output summary:   Intake/Output Summary (Last 24 hours) at 08/04/2017 1524 Last data filed at 08/04/2017 1439 Gross per 24 hour  Intake 1913.28 ml  Output 1300 ml  Net 613.28 ml    LBM: Last BM Date: (PTA) Baseline Weight: Weight: 70.7 kg (155 lb 13.8 oz) Most recent weight: Weight: 70.1 kg (154 lb 8.7 oz)     Palliative Assessment/Data: 20%     Time In: 2:50 Time Out: 3:40 Time Total: 50 min Greater than 50%  of this time was spent counseling and coordinating care related to the above assessment and plan.  Signed by: Asencion Gowda, NP   Please contact  Palliative Medicine Team phone at [phone] for questions and concerns.  For individual provider: See Shea Evans

## 2017-08-04 NOTE — Progress Notes (Signed)
Inpatient Rehabilitation-Admissions Coordinator    Met with patient and family at the bedside to discuss PM&R consult. Shared booklets, expectations while in CIR, expected length of stay, and anticipated functional level at DC. Family unsure of where they feel the patient will end up in the long term (SNF vs ALF vs return to ILF with assistance). AC explained that the anticipated DC venue/support system will determine whether or not CIR is appropriate going forward. AC plans to follow up later with family regarding decision on final dispo and will then discuss recommended venue afterwards. Please call if questions.   Jhonnie Garner, OTR/L  Rehab Admissions Coordinator  [phone] 08/04/2017 6:02 PM

## 2017-08-04 NOTE — Evaluation (Signed)
Occupational Therapy Evaluation Patient Details Name: Sara Russell MRN: [id number] DOB: May 11, 1930 Today's Date: 08/04/2017    History of Present Illness Patient is an 84 year old female admitted with acute left thalamus/IC/basal ganglia hemorrhage.  PMH positive for a-fib, DVT/PE, HLD, DJD, HTN, skin CA and osteoporosis.   Clinical Impression   This 84 y/o female presents with the above. PLOF obtained from family with family reporting pt resides in [place], was mod independent with ADLs and functional mobility using RW for longer distances ambulation, family reporting pt was ambulating in room without AD. Pt very pleasant during session and motivated to work with therapy. Pt presenting with expressive aphasia, decreased standing balance, impaired RUE coordination, and generalized weakness. Pt requiring ModA+2 for stand pivot transfers using RW this session, completing toileting with overall MaxA+2. Pt requiring maxA+2 for LB ADLs, minA for seated UB ADL. Pt will benefit from continued acute OT services and recommend CIR level therapy services after discharge to maximize her overall safety and independence with ADLs and functional mobility.     Follow Up Recommendations  CIR;Supervision/Assistance - 24 hour    Equipment Recommendations  Other (comment)(TBD in next venue )           Precautions / Restrictions Precautions Precautions: Fall Precaution Comments: watch BP  Restrictions Weight Bearing Restrictions: No      Mobility Bed Mobility Overal bed mobility: Needs Assistance Bed Mobility: Supine to Sit     Supine to sit: Mod assist;HOB elevated;+2 for physical assistance     General bed mobility comments: assist to initiate with legs off bed and assist for lifting trunk  Transfers Overall transfer level: Needs assistance Equipment used: Rolling walker (2 wheeled) Transfers: Sit to/from [facility] Sit to Stand: Mod assist;+2 safety/equipment Stand pivot  transfers: Mod assist;+2 physical assistance       General transfer comment: lifting assist from EOB and assist for hand placement on walker, able to stand and step to BSC leaning to R and assist for R LE at times due to foot placement; stand and step to recliner mod cues, +2 for balance, safety, walker management    Balance Overall balance assessment: Needs assistance;History of Falls Sitting-balance support: Feet supported Sitting balance-Leahy Scale: Fair   Postural control: Right lateral lean Standing balance support: Bilateral upper extremity supported Standing balance-Leahy Scale: Poor Standing balance comment: standing leans to R                            ADL either performed or assessed with clinical judgement   ADL Overall ADL's : Needs assistance/impaired Eating/Feeding: Minimal assistance;Sitting   Grooming: Wash/dry face;Min guard;Sitting;Minimal assistance Grooming Details (indicate cue type and reason): intermittent minA for static sitting; cues for thoroughness and to termintate task as pt somewhat perseverative Upper Body Bathing: Minimal assistance;Sitting   Lower Body Bathing: Moderate assistance;+2 for physical assistance;+2 for safety/equipment;Sit to/from stand   Upper Body Dressing : Minimal assistance;Sitting   Lower Body Dressing: +2 for physical assistance;+2 for safety/equipment;Sit to/from stand;Maximal assistance Lower Body Dressing Details (indicate cue type and reason): modA+2 for sit<>stand  Toilet Transfer: Moderate assistance;+2 for safety/equipment;+2 for physical assistance;Stand-pivot;BSC;RW Toilet Transfer Details (indicate cue type and reason): multimodal cues for sequencing RW and for initiating taking steps  Toileting- Clothing Manipulation and Hygiene: Maximal assistance;+2 for safety/equipment;+2 for physical assistance;Sit to/from stand Toileting - Clothing Manipulation Details (indicate cue type and reason): steadying assist  while standing at RW with  additional assist for brief and gown management; pt not able to void bladder at this time      Functional mobility during ADLs: +2 for physical assistance;+2 for safety/equipment;Rolling walker(stand pivot transfers ) General ADL Comments: pt sat EOB for simple grooming ADLs, completed stand pivot to [facility] to attempt voiding bladder prior to transfer to recliner; pt with decreased coordination, standing balance, expressive aphasia      Vision   Vision Assessment?: Yes Tracking/Visual Pursuits: Decreased smoothness of horizontal tracking;Decreased smoothness of vertical tracking Additional Comments: to be further assessed      Perception     Praxis      Pertinent Vitals/Pain Pain Assessment: Faces Faces Pain Scale: Hurts little more Pain Location: R LE with elevation at hip Pain Descriptors / Indicators: Grimacing Pain Intervention(s): Monitored during session;Repositioned     Hand Dominance Right   Extremity/Trunk Assessment Upper Extremity Assessment Upper Extremity Assessment: RUE deficits/detail;Generalized weakness RUE Deficits / Details: decreased coordination noted during functional tasks and when reaching to brush hair out of face, when attempting to grasp RW, decreased grip strength RUE Coordination: decreased fine motor;decreased gross motor   Lower Extremity Assessment Lower Extremity Assessment: Defer to PT evaluation       Communication Communication Communication: Expressive difficulties   Cognition Arousal/Alertness: Awake/alert Behavior During Therapy: Flat affect Overall Cognitive Status: Difficult to assess                                 General Comments: expressive aphasia; follows commands with mild delay   General Comments  son and daughter in room and reporting prior functional level, interested in inpatient rehab for her to return hopefully to ILF    Exercises     Shoulder Instructions      Home  Living Family/patient expects to be discharged to:: (ILF )                                 Additional Comments: [facility]       Prior Functioning/Environment Level of Independence: Independent with assistive device(s)        Comments: using RW for longer distance ambulation, was ambulating within room without AD         OT Problem List: Decreased strength;Impaired balance (sitting and/or standing);Decreased cognition;Decreased range of motion;Decreased activity tolerance;Decreased knowledge of use of DME or AE;Decreased coordination      OT Treatment/Interventions: Self-care/ADL training;DME and/or AE instruction;Therapeutic activities;Balance training;Therapeutic exercise;Patient/family education;Neuromuscular education;Visual/perceptual remediation/compensation    OT Goals(Current goals can be found in the care plan section) Acute Rehab OT Goals Patient Stated Goal: to go to rehab OT Goal Formulation: With patient Time For Goal Achievement: 08/18/17 Potential to Achieve Goals: Good  OT Frequency: Min 3X/week   Barriers to D/C:            Co-evaluation PT/OT/SLP Co-Evaluation/Treatment: Yes Reason for Co-Treatment: Complexity of the patient's impairments (multi-system involvement);For patient/therapist safety PT goals addressed during session: Mobility/safety with mobility;Balance;Proper use of DME OT goals addressed during session: ADL's and self-care;Proper use of Adaptive equipment and DME      AM-PAC PT "6 Clicks" Daily Activity     Outcome Measure Help from another person eating meals?: A Little Help from another person taking care of personal grooming?: A Little Help from another person toileting, which includes using toliet, bedpan, or urinal?: A Lot Help from  another person bathing (including washing, rinsing, drying)?: A Lot Help from another person to put on and taking off regular upper body clothing?: A Lot Help from another person to put  on and taking off regular lower body clothing?: A Lot 6 Click Score: 14   End of Session Equipment Utilized During Treatment: Gait belt;Rolling walker Nurse Communication: Mobility status  Activity Tolerance: Patient tolerated treatment well Patient left: in chair;with call bell/phone within reach;with chair alarm set;with family/visitor present  OT Visit Diagnosis: Other abnormalities of gait and mobility (R26.89);Other symptoms and signs involving the nervous system (R29.898)                Time: 9359-4090 OT Time Calculation (min): 30 min Charges:  OT General Charges $OT Visit: 1 Visit OT Evaluation $OT Eval Moderate Complexity: 1 Mod G-Codes:     Lou Cal, OT Pager [phone] 08/04/2017  [name] 08/04/2017, 1:34 PM

## 2017-08-04 NOTE — Evaluation (Signed)
Clinical/Bedside Swallow Evaluation Patient Details  Name: Sara Russell MRN: [id number] Date of Birth: 1930/08/31  Today's Date: 08/04/2017 Time: SLP Start Time (ACUTE ONLY): 1100 SLP Stop Time (ACUTE ONLY): 1127 SLP Time Calculation (min) (ACUTE ONLY): 27 min  Past Medical History:  Past Medical History:  Diagnosis Date  . Abdominal mass   . AKI (acute kidney injury) ([place]) 09/15/2014  . Atrial fibrillation ([place])   . Colitis, ulcerative ([place])   . Cough    WHITE SPUTUM FOR 2 WEEKS, NO FEVER  . Degenerative joint disease   . Diverticulosis   . DVT (deep venous thrombosis) ([place]) 2009   BOTH LUNGS  . GERD (gastroesophageal reflux disease)   . Hyperlipidemia   . Hyperplastic colon polyp   . Hypertension   . Hypothyroidism   . Iron deficiency anemia    ON IRON AT TIMES  . PE (pulmonary embolism) 2009  . Persistent atrial fibrillation ([place])   . Positive H. pylori test 01/20/96  . S/P dilatation of esophageal stricture 1992  . Skin cancer    h/o basal and squamous skin cancer--> removed   Past Surgical History:  Past Surgical History:  Procedure Laterality Date  . APPENDECTOMY    . BREAST EXCISIONAL BIOPSY Left 1992   benign  . BREAST LUMPECTOMY     benign  . DILATION AND CURETTAGE OF UTERUS     for endometrial polyps  . ENDOSCOPIC RETROGRADE CHOLANGIOPANCREATOGRAPHY (ERCP) WITH PROPOFOL N/A 03/23/2015   Procedure: ENDOSCOPIC RETROGRADE CHOLANGIOPANCREATOGRAPHY (ERCP) WITH PROPOFOL;  Surgeon: Milus Banister, MD;  Location: [facility] ENDOSCOPY;  Service: Endoscopy;  Laterality: N/A;  . JOINT REPLACEMENT    . ORIF ANKLE FRACTURE Left    X 2  . REPLACEMENT TOTAL KNEE     left  . SURGERY DONE TO REMOVE LEFT ANKLE HARDWARE LATER    . TONSILLECTOMY     HPI:  Patient is an 84 year old female admitted with acute left thalamus/IC/basal ganglia hemorrhage.  PMH positive for a-fib, DVT/PE, HLD, DJD, HTN, skin CA and osteoporosis   Assessment / Plan / Recommendation Clinical  Impression  Pt demonstrates no immediate signs of aspiration. There is only mild right labial droop to indicate any neuromuscular weakness. Pt otherwise able to orally manipulate liquids and solids without struggle, swallow appears timely and strong. Only concern is a congested cough that has been present since admission which was also observed intermittently during assessment, but not directly associated with PO intake. Pts family reports pt was struggling with secretions initially and was very lethargic. At the time of asessment today vocal quality is clear and dry, cough is strong and pt is fully alert. Will initiate a regular diet and thin liquids and follow for tolerance.  SLP Visit Diagnosis: Dysphagia, unspecified (R13.10)    Aspiration Risk  Mild aspiration risk    Diet Recommendation Regular;Thin liquid   Liquid Administration via: Cup;Straw Medication Administration: Whole meds with liquid Supervision: Patient able to self feed Compensations: Minimize environmental distractions;Slow rate;Small sips/bites Postural Changes: Seated upright at 90 degrees    Other  Recommendations Oral Care Recommendations: Oral care BID   Follow up Recommendations Inpatient Rehab      Frequency and Duration min 2x/week  2 weeks       Prognosis Prognosis for Safe Diet Advancement: Good      Swallow Study   General HPI: Patient is an 84 year old female admitted with acute left thalamus/IC/basal ganglia hemorrhage.  PMH positive for a-fib, DVT/PE, HLD,  DJD, HTN, skin CA and osteoporosis Type of Study: Bedside Swallow Evaluation Previous Swallow Assessment: none Diet Prior to this Study: NPO Temperature Spikes Noted: No Respiratory Status: Room air History of Recent Intubation: No Behavior/Cognition: Alert;Cooperative;Pleasant mood Oral Care Completed by SLP: No Oral Cavity - Dentition: Adequate natural dentition Vision: Functional for self-feeding Self-Feeding Abilities: Able to feed  self Patient Positioning: Upright in chair Baseline Vocal Quality: Normal Volitional Cough: Strong Volitional Swallow: Able to elicit    Oral/Motor/Sensory Function     Ice Chips Ice chips: Within functional limits   Thin Liquid Thin Liquid: Within functional limits Presentation: Cup;Straw;Self Fed    Nectar Thick Nectar Thick Liquid: Not tested   Honey Thick Honey Thick Liquid: Not tested   Puree Puree: Within functional limits Presentation: Self Fed;Spoon   Solid   GO   Solid: Within functional limits Presentation: Sara Shelvy Perazzo, MA CCC-SLP [phone]  Sara Russell, Katherene Ponto 08/04/2017,1:28 PM

## 2017-08-04 NOTE — Plan of Care (Signed)
Education given to patient and family at bedside. All questions answered. Lianne Bushy RN BSN.

## 2017-08-04 NOTE — Progress Notes (Signed)
Rehab Admissions Coordinator Note:  Patient was screened by Cleatrice Burke for appropriateness for an Inpatient Acute Rehab Consult per PT recommendation.  At this time, we are recommending Inpatient Rehab consult.  Cleatrice Burke 08/04/2017, 12:11 PM  I can be reached at [phone].

## 2017-08-04 NOTE — Consult Note (Signed)
Physical Medicine and Rehabilitation Consult Reason for Consult: Decreased functional mobility Referring Physician: Dr. Leonie Man   HPI: Sara Russell is a 84 y.o. right-handed female with history of atrial fibrillation maintained on amiodarone as well as Eliquis, DVT/PE, hypertension.  Per chart review and daughter-in-law, patient lives alone at [facility] for the past 3 years.  Presented 08/02/2017 with right sided weakness, facial droop and aphasia.  Blood pressure 221/89.  MRI brain reviewed, showing left thalamic hemorrhage.  Per report, acute left thalamic intraparenchymal hemorrhage measuring 1.9 x 2.0 x 2.5 cm.  Mildly increased localized edema with trace left to right midline shift.  No intraventricular extension or hydrocephalus.  2 subcentimeter subacute ischemic cortical infarcts involving the left frontal and parieto-occipital regions.  Echocardiogram pending.  Neurology consulted with work-up ongoing.  Presently on Cleviprex for blood pressure control.  Eliquis remains on hold.  Tolerating a regular diet.  Physical therapy evaluation 08/04/2017 with recommendations of physical medicine rehab consult.  Review of Systems  Unable to perform ROS: Language   Past Medical History:  Diagnosis Date  . Abdominal mass   . AKI (acute kidney injury) ([place]) 09/15/2014  . Atrial fibrillation ([place])   . Colitis, ulcerative ([place])   . Cough    WHITE SPUTUM FOR 2 WEEKS, NO FEVER  . Degenerative joint disease   . Diverticulosis   . DVT (deep venous thrombosis) ([place]) 2009   BOTH LUNGS  . GERD (gastroesophageal reflux disease)   . Hyperlipidemia   . Hyperplastic colon polyp   . Hypertension   . Hypothyroidism   . Iron deficiency anemia    ON IRON AT TIMES  . PE (pulmonary embolism) 2009  . Persistent atrial fibrillation ([place])   . Positive H. pylori test 01/20/96  . S/P dilatation of esophageal stricture 1992  . Skin cancer    h/o basal and squamous skin  cancer--> removed   Past Surgical History:  Procedure Laterality Date  . APPENDECTOMY    . BREAST EXCISIONAL BIOPSY Left 1992   benign  . BREAST LUMPECTOMY     benign  . DILATION AND CURETTAGE OF UTERUS     for endometrial polyps  . ENDOSCOPIC RETROGRADE CHOLANGIOPANCREATOGRAPHY (ERCP) WITH PROPOFOL N/A 03/23/2015   Procedure: ENDOSCOPIC RETROGRADE CHOLANGIOPANCREATOGRAPHY (ERCP) WITH PROPOFOL;  Surgeon: Milus Banister, MD;  Location: [facility] END[facility]COPY;  Service: Endoscopy;  Laterality: N/A;  . JOINT REPLACEMENT    . ORIF ANKLE FRACTURE Left    X 2  . REPLACEMENT TOTAL KNEE     left  . SURGERY DONE TO REMOVE LEFT ANKLE HARDWARE LATER    . TONSILLECTOMY     Family History  Problem Relation Age of Onset  . Esophageal cancer Brother   . Diverticulitis Brother   . Heart disease Mother   . Stroke Mother   . Dementia Mother   . Heart attack Mother   . Heart disease Father   . Atrial fibrillation Father   . Breast cancer Maternal Aunt   . Colon cancer Neg Hx    Social History:  reports that she quit smoking about 56 years ago. Her smoking use included cigarettes. She started smoking about 69 years ago. She has a 6.50 pack-year smoking history. She has never used smokeless tobacco. She reports that she drinks about 3.0 oz of alcohol per week. She reports that she does not use drugs. Allergies:  Allergies  Allergen Reactions  . Iohexol Hives  . Ivp Dye [Iodinated  Diagnostic Agents] Hives    Was able to tolerate Contrast Media for CT scan.  [name] [Shellfish Allergy] Nausea And Vomiting    Projectile vomiting  . Shellfish-Derived Products Nausea And Vomiting  . Sulfa Antibiotics Hives  . Sulfamethoxazole Hives   Medications Prior to Admission  Medication Sig Dispense Refill  . acetaminophen (TYLENOL ARTHRITIS PAIN) 650 MG CR tablet Take 650 mg by mouth every 8 (eight) hours as needed for pain.     Marland Kitchen amiodarone (PACERONE) 100 MG tablet Take 1 tablet (100 mg total) by mouth daily.  90 tablet 3  . apixaban (ELIQUIS) 2.5 MG TABS tablet Take 1 tablet (2.5 mg total) by mouth 2 (two) times daily. 180 tablet 3  . atorvastatin (LIPITOR) 10 MG tablet Take 1 tablet (10 mg total) by mouth daily. 90 tablet 3  . carvedilol (COREG) 12.5 MG tablet Take 1 tablet (12.5 mg total) by mouth 2 (two) times daily with a meal. 180 tablet 3  . diphenhydramine-acetaminophen (TYLENOL PM) 25-500 MG TABS tablet Take 1 tablet by mouth at bedtime as needed.    . furosemide (LASIX) 20 MG tablet Take 1 tablet (20 mg total) by mouth every other day as needed for fluid or edema. 45 tablet 3  . Lactobacillus Rhamnosus, GG, (CULTURELLE PO) Take 1 Dose by mouth daily.    Marland Kitchen levothyroxine (SYNTHROID, LEVOTHROID) 75 MCG tablet Take 1 tablet (75 mcg total) by mouth daily. 90 tablet 3  . omeprazole (PRILOSEC) 40 MG capsule Take 1 capsule (40 mg total) by mouth daily. 90 capsule 3  . Simethicone (GAS RELIEF PO) Take 1 tablet by mouth 2 (two) times daily.    . traZODone (DESYREL) 50 MG tablet Take 0.5-1 tablets (25-50 mg total) by mouth at bedtime as needed for sleep. 30 tablet 3    Home:    Functional History:   Functional Status:  Mobility: Bed Mobility Overal bed mobility: Needs Assistance Bed Mobility: Supine to Sit Supine to sit: Mod assist, HOB elevated, +2 for physical assistance General bed mobility comments: assist to initiate with legs off bed and assist for lifting trunk Transfers Overall transfer level: Needs assistance Equipment used: Rolling walker (2 wheeled) Transfers: Sit to/from Stand, Stand Pivot Transfers Sit to Stand: Mod assist, +2 safety/equipment Stand pivot transfers: Mod assist, +2 physical assistance General transfer comment: lifting assist from EOB and assist for hand placement on walker, able to stand and step to BSC leaning to R and assist for R LE at times due to foot placement; stand and step to recliner mod cues, +2 for balance, safety, walker management      ADL:     Cognition: Cognition Overall Cognitive Status: Difficult to assess Orientation Level: Oriented to person, Disoriented to place, Disoriented to time, Disoriented to situation Cognition Arousal/Alertness: Awake/alert Behavior During Therapy: Flat affect Overall Cognitive Status: Difficult to assess General Comments: expressive aphasia; follows commands with mild delay Difficult to assess due to: Impaired communication  Blood pressure (!) 163/142, pulse 75, temperature 98.2 F (36.8 C), temperature source Axillary, resp. rate 16, height 5' 4"  (1.626 m), weight 70.1 kg (154 lb 8.7 oz), SpO2 99 %. Physical Exam  Vitals reviewed. Constitutional: She appears well-developed and well-nourished.  HENT:  Head: Normocephalic and atraumatic.  Eyes: EOM are normal. Right eye exhibits no discharge. Left eye exhibits no discharge.  Pupils reactive to light  Neck: Normal range of motion. Neck supple. No thyromegaly present.  Cardiovascular:  Irregularly irregular  Respiratory: Effort normal and  breath sounds normal. No respiratory distress.  GI: Soft. Bowel sounds are normal. She exhibits no distension.  Musculoskeletal:  No edema or tenderness in extremities  Neurological: She is alert.  Expressive >> Receptive aphasia She did follow some basic simple commands inconsistently Right facial weakness Dysarthria Motor: RUE/RLE: 4--4/5 proximal to distal LUE/LLE: 4-4+/5 proximal to distal  Skin: Skin is warm and dry.  Psychiatric: Her speech is delayed and slurred. She is slowed.    No results found for this or any previous visit (from the past 24 hour(s)). Ct Head Wo Contrast  Result Date: 08/03/2017 CLINICAL DATA:  83 y/o  F; follow-up of intracranial hemorrhage. EXAM: CT HEAD WITHOUT CONTRAST TECHNIQUE: Contiguous axial images were obtained from the base of the skull through the vertex without intravenous contrast. COMPARISON:  08/03/2017 MRI of the head.  08/02/2017 CT of the head.  FINDINGS: Brain: Stable acute hemorrhage centered within the left thalamus measuring 1.9 x 1.7 x 1.4 cm (AP x ML x CC series 3, image 14 and series 5, image 31) when compared with the prior CT of the head given differences in technique. Mild interval increase in surrounding vasogenic edema with 4 mm of left-to-right midline shift of septum pellucidum. No new acute hemorrhage, infarct, or focal mass effect. Stable background of chronic microvascular ischemic changes and parenchymal volume loss of the brain. No extra-axial collection, hydrocephalus, or effacement of basilar cisterns. Vascular: Calcific atherosclerosis of carotid siphons and vertebral arteries. No hyperdense vessel identified. Skull: Normal. Negative for fracture or focal lesion. Sinuses/Orbits: No acute finding. Other: Bilateral intra-ocular lens replacement. IMPRESSION: 1. Stable acute hemorrhage centered within left thalamus. Mild increase in edema and mass effect from 08/02/2017 with 4 mm left-to-right midline shift, previously 3 mm. No herniation. 2. No new acute intracranial abnormality. 3. Stable chronic microvascular ischemic changes and parenchymal volume loss of the brain. Electronically Signed   By: Kristine Garbe M.D.   On: 08/03/2017 20:43   Mr Brain Wo Contrast  Result Date: 08/03/2017 CLINICAL DATA:  Initial evaluation for acute intracranial hemorrhage. EXAM: MRI HEAD WITHOUT CONTRAST TECHNIQUE: Multiplanar, multiecho pulse sequences of the brain and surrounding structures were obtained without intravenous contrast. COMPARISON:  Prior CT from 08/02/2017. FINDINGS: Brain: Study moderately degraded by motion artifact. Acute intraparenchymal hematoma centered at the left thalamus/internal capsule again seen, relatively stable in size measuring approximately 1.9 x 2.0 x 2.5 cm. Localized vasogenic edema and mass effect slightly increased from previous. Mild mass effect on the adjacent third ventricle with trace localized  left-to-right shift. No intraventricular extension. No appreciable underlying mass or other abnormality. Atrophy with chronic microvascular ischemic changes. Small foci of mildly hyperintense DWI signal at the anterior left frontal lobe (series 5, image 71) as well as the left parieto-occipital region (series 5, image 59) consistent with small subacute ischemic infarcts. No other evidence for acute or subacute ischemia. Gray-white matter differentiation otherwise maintained. Several scattered chronic micro hemorrhages seen within the cerebellum, deep gray nuclei, and bilateral cerebral hemispheres, favored to be hypertensive in nature. No mass lesion. Ventricles normal in size without hydrocephalus. No extra-axial fluid collection. Incidental note made of a partially empty sella. Vascular: Major intracranial vascular flow voids are maintained. Skull and upper cervical spine: Craniocervical junction within normal limits. Upper cervical spine normal. Bone marrow signal intensity within normal limits. Scalp soft tissues unremarkable. Sinuses/Orbits: Globes and orbital soft tissues within normal limits. Patient status post ocular lens extraction bilaterally. Mild ethmoidal sinus mucosal thickening. Paranasal sinuses are otherwise  clear. No mastoid effusion. Inner ear structures normal. Other: None. IMPRESSION: 1. Similar size of acute left thalamic intraparenchymal hemorrhage, measuring 1.9 x 2.0 x 2.5 cm. Mildly increased localized edema with trace left-to-right midline shift. No intraventricular extension, hydrocephalus, or other complication identified. No underlying mass or other abnormality. 2. Two subcentimeter subacute ischemic cortical infarcts involving the left frontal and parietooccipital regions as above. 3. Additional scattered chronic micro hemorrhages involving the cerebral and cerebellar hemispheres, favored to be secondary to chronic uncontrolled hypertension. 4. Atrophy with chronic microvascular  ischemic disease. Electronically Signed   By: Jeannine Boga M.D.   On: 08/03/2017 04:45   Dg Pelvis Portable  Result Date: 08/03/2017 CLINICAL DATA:  Right hip pain EXAM: PORTABLE PELVIS 1-2 VIEWS COMPARISON:  09/18/2014 CT FINDINGS: Pubic symphysis and rami are intact. SI joints are patent. No fracture or malalignment. Vascular calcifications. IMPRESSION: No acute osseous abnormality. Electronically Signed   By: Donavan Foil M.D.   On: 08/03/2017 01:43   Dg Femur Port, Min 2 Views Right  Result Date: 08/03/2017 CLINICAL DATA:  Right hip pain today EXAM: RIGHT FEMUR PORTABLE 2 VIEW COMPARISON:  None. FINDINGS: Mild irregularity at the right inferior pubic ramus. Vascular calcification. No fracture or malalignment. Sclerosis in the subarticular proximal medial tibia. IMPRESSION: 1. No acute osseous abnormality. 2. Mild irregularity of the right inferior pubic ramus is questionable for fracture. Electronically Signed   By: Donavan Foil M.D.   On: 08/03/2017 01:45   Ct Head Code Stroke Wo Contrast  Addendum Date: 08/02/2017   ADDENDUM REPORT: 08/02/2017 19:40 ADDENDUM: Critical Value/emergent results were called by telephone at the time of interpretation on 08/02/2017 at 7:39 pm to Dr. Cheral Marker, who verbally acknowledged these results. Electronically Signed   By: Kristine Garbe M.D.   On: 08/02/2017 19:40   Result Date: 08/02/2017 CLINICAL DATA:  Code stroke.  84 y/o  F; right facial droop. EXAM: CT HEAD WITHOUT CONTRAST TECHNIQUE: Contiguous axial images were obtained from the base of the skull through the vertex without intravenous contrast. COMPARISON:  01/28/2017 CT head. FINDINGS: Brain: Acute hemorrhage centered within left thalamus/posterior limb of internal capsule measuring 1.9 x 1.7 x 1.5 cm (volume = 2.5 cm^3). Mild surrounding edema and local mass effect with 3 mm of left-to-right midline shift of septum pellucidum. Stable background of chronic microvascular ischemic changes  and parenchymal volume loss of the brain. No extra-axial hemorrhage, large acute stroke, hydrocephalus, or herniation. Vascular: Calcific atherosclerosis of the carotid siphons and vertebral arteries. Hyperdense vessel identified. Skull: Normal. Negative for fracture or focal lesion. Sinuses/Orbits: No acute finding. Other: Bilateral intra-ocular lens replacement. IMPRESSION: 1. Acute hemorrhage centered within left thalamus/posterior limb of internal capsule measuring up to 1.9 cm, 2.5 cc. Mild local mass effect. 2. Stable chronic microvascular ischemic changes and parenchymal volume loss of the brain. 3. ASPECTS is not applicable. Electronically Signed: By: Kristine Garbe M.D. On: 08/02/2017 19:29    Assessment/Plan: Diagnosis: Left thalamic hemorrhage Labs and images independently reviewed.  Records reviewed and summated above. Stroke: Continue secondary stroke prophylaxis and Risk Factor Modification listed below:   Blood Pressure Management:  Continue current medication with prn's with permisive HTN per primary team Left sided hemiparesis Motor recovery: Fluoxetine  1. Does the need for close, 24 hr/day medical supervision in concert with the patient's rehab needs make it unreasonable for this patient to be served in a less intensive setting? Potentially  2. Co-Morbidities requiring supervision/potential complications: atrial fibrillation (Eliquis on hold, monitor HR  with increased mobility), DVT/PE, HTN (monitor and provide prns in accordance with increased physical exertion and pain), hyponatremia (cont to monitor, treat if necessary), CKD (avoid nephrotoxic meds) 3. Due to bladder management, safety, skin/wound care, disease management, medication administration and patient education, does the patient require 24 hr/day rehab nursing? Yes 4. Does the patient require coordinated care of a physician, rehab nurse, PT (1-2 hrs/day, 5 days/week), OT (1-2 hrs/day, 5 days/week) and SLP (1-2  hrs/day, 5 days/week) to address physical and functional deficits in the context of the above medical diagnosis(es)? Yes Addressing deficits in the following areas: balance, endurance, locomotion, strength, transferring, bowel/bladder control, bathing, dressing, toileting, cognition, speech, language and psychosocial support 5. Can the patient actively participate in an intensive therapy program of at least 3 hrs of therapy per day at least 5 days per week? Yes 6. The potential for patient to make measurable gains while on inpatient rehab is excellent 7. Anticipated functional outcomes upon discharge from inpatient rehab are supervision and min assist  with PT, supervision and min assist with OT, min assist with SLP. 8. Estimated rehab length of stay to reach the above functional goals is: 16-20 days. 9. Anticipated D/C setting: Other 10. Anticipated post D/C treatments: SNF 11. Overall Rehab/Functional Prognosis: good  RECOMMENDATIONS: This patient's condition is appropriate for continued rehabilitative care in the following setting: Per daughter-in-law, plans to return to SNF portion of [facility]. Believe this is reasonable after completion of medical workup.  Recommend SNF with PM&R outpt follow up. Patient has agreed to participate in recommended program. Potentially Note that insurance prior authorization may be required for reimbursement for recommended care.  Comment: Rehab Admissions Coordinator to follow up.   I have personally performed a face to face diagnostic evaluation, including, but not limited to relevant history and physical exam findings, of this patient and developed relevant assessment and plan.  Additionally, I have reviewed and concur with the physician assistant's documentation above.   Delice Lesch, MD, ABPMR Lavon Paganini Angiulli, PA-C 08/04/2017

## 2017-08-04 NOTE — Progress Notes (Signed)
OT Cancellation Note  Patient Details Name: Sara Russell MRN: [id number] DOB: 12-06-1930   Cancelled Treatment:    Reason Eval/Treat Not Completed: Active bedrest order, will follow up and initiate OT eval as pt is medically ready.   Lou Cal, OT Pager [phone] 08/04/2017   [name] 08/04/2017, 7:38 AM

## 2017-08-04 NOTE — Progress Notes (Signed)
  Echocardiogram 2D Echocardiogram has been performed.  Darlina Sicilian M 08/04/2017, 2:07 PM

## 2017-08-04 NOTE — Progress Notes (Signed)
FPTS Social Note  Met Ms. Quale today as well as two of her sons and her daughter in law.  They say she has improved significantly overnight, with greater use of her RUE and ability to understand and follow commands.  She seems alert to me and follows along with conversation, although she has expressive aphasia.  She is moving her RUE during our conversation.  We appreciate the excellent care given by Neurology and will care for patient once she is out of the Neuro ICU.  Amanda C. Shan Levans, MD PGY-1, [facility] 08/04/2017 11:48 AM

## 2017-08-05 ENCOUNTER — Inpatient Hospital Stay (HOSPITAL_COMMUNITY): Payer: Medicare Other

## 2017-08-05 DIAGNOSIS — I619 Nontraumatic intracerebral hemorrhage, unspecified: Secondary | ICD-10-CM

## 2017-08-05 DIAGNOSIS — Z515 Encounter for palliative care: Secondary | ICD-10-CM

## 2017-08-05 DIAGNOSIS — Z7189 Other specified counseling: Secondary | ICD-10-CM

## 2017-08-05 LAB — TRIGLYCERIDES: Triglycerides: 76 mg/dL (ref <150)

## 2017-08-05 MED ORDER — ATORVASTATIN CALCIUM 10 MG PO TABS
10.0000 mg | ORAL_TABLET | Freq: Every day | ORAL | Status: DC
  Administered 2017-08-05: 10 mg via ORAL
  Filled 2017-08-05: qty 1

## 2017-08-05 NOTE — Progress Notes (Signed)
  Speech Language Pathology Treatment: Dysphagia;Cognitive-Linquistic  Patient Details Name: Sara Russell MRN: [id number] DOB: [birth year] Today's Date: 08/05/2017 Time: 2202-5427 SLP Time Calculation (min) (ACUTE ONLY): 22 min  Assessment / Plan / Recommendation Clinical Impression  Pt demonstrates adequate toelrance of thin liquids and regular diet with cues and assist from problem solving, locating objects at midline and right of midline. Pt continues to verbalize fluent jargon, paraphasias and perseverations. Auditory comprehension still very poor. With max cues with facial expression, prosody and gestures pt was able to identify objects in a field of two with 100% accuracy over 5 trials, named objects with phonemic cues. Attempted similar task with naming sons, without success. Visual/perceptual deficits evident at a greater distance. Provided further education and instruction in cueing to sons. Continue to recommend CIR as pt participates very actively in tx.   HPI HPI: Patient is an 84 year old female admitted with acute left thalamus/IC/basal ganglia hemorrhage.  PMH positive for a-fib, DVT/PE, HLD, DJD, HTN, skin CA and osteoporosis      SLP Plan  Continue with current plan of care       Recommendations  Diet recommendations: Regular;Thin liquid Liquids provided via: Cup;Straw Medication Administration: Whole meds with liquid Supervision: Staff to assist with self feeding Compensations: Slow rate;Small sips/bites;Minimize environmental distractions Postural Changes and/or Swallow Maneuvers: Seated upright 90 degrees                Oral Care Recommendations: Oral care BID Follow up Recommendations: Inpatient Rehab SLP Visit Diagnosis: Aphasia (R47.01) Plan: Continue with current plan of care       GO               [facility], [place] CCC-SLP [phone]  Sara Russell 08/05/2017, 1:16 PM

## 2017-08-05 NOTE — Progress Notes (Signed)
Preliminary results by tech - Carotid duplex completed. No evidence of stenosis in bilateral carotid arteries. Oda Cogan, BS, RDMS, RVT

## 2017-08-05 NOTE — PMR Pre-admission (Addendum)
PMR Admission Coordinator Pre-Admission Assessment  Patient: Sara Russell is an 84 y.o., female MRN: [id number] DOB: March 29, 1930 Height: _0  (162.6 cm) Weight: 70.1 kg (154 lb 8.7 oz)              Insurance Information HMO:     PPO:      PCP:      IPA:      80/20: Yes     OTHER:  PRIMARY: Medicare Part A and B      Policy#: [id number]      Subscriber: Patient CM Name:       Phone#:      Fax#:  Pre-Cert#:       Employer:  Benefits:  Phone #: Verified eligibility online on 08/05/17     Name: [employer] Eff. Date: Part A eligible 08/11/1997; Part B eligible 08/11/1997     Deduct: $1364      Out of Pocket Max: NA      Life Max: NA CIR: Covered per Medicare guidelines after yearly deductible met      SNF: days 1-20 100%; 80% 21-100 Outpatient: 80%     Co-Pay: 20% Home Health: 100%      Co-Pay: 0% DME: 80%     Co-Pay: 20% Providers: Pt's choice  SECONDARY: BCBS      Policy#: [id number]      Subscriber: Patient CM Name:       Phone#:      Fax#:  Pre-Cert#:       Employer:  Benefits:  Phone #: [phone]     Name:  Eff. Date:      Deduct:       Out of Pocket Max:       Life Max:  CIR:       SNF:  Outpatient:      Co-Pay:  Home Health:       Co-Pay:  DME:      Co-Pay:   Medicaid Application Date:       Case Manager:  Disability Application Date:       Case Worker:   Emergency Contact Information Contact Information    Name Relation Home Work [place] Son [phone]  [phone]   Saanya, Zieske   [phone]   Shenee, Wignall   [phone]     Current Medical History  Patient Admitting Diagnosis: Left thalamic hemorrhage History of Present Illness: Sara Russell is an 84 year old right-handed female with history of mild renal insufficiency with creatinine 3.89, chronic diastolic congestive heart failure, atrial fibrillation maintained on amiodarone as well as Eliquis, DVT/PE, hypertension.  Patient did receive inpatient rehab services August 2016 for  debilitation related to congestive heart failure.  Per chart review and daughter-in-law.  Patient lives alone at [facility] facility for the past 3 years.  Presented 08/02/2017 with right sided weakness, facial droop and aphasia.  Blood pressure 221/89.  MRI reviewed showed left thalamic hemorrhage.  Per report, acute left thalamic intraparenchymal hemorrhage measuring 1.9 x 2.0 x 2.5 cm.  Mildly increased localized edema with trace left to right midline shift.  No intraventricular extension or hydrocephalus.  2 subcentimeter subacute ischemic cortical infarct involving the left frontal and parietal occipital regions.  Echocardiogram with ejection fraction of 65% no wall motion abnormalities grade 2 diastolic dysfunction.  Initially maintained on Cleviprex for blood pressure control.  Eliquis held secondary to intraparenchymal hemorrhage.  Tolerating a regular diet.  Physical occupational therapy evaluations completed with recommendations of physical  medicine rehab consult. Patient is to be admitted for a conference of rehab program on 08/06/17.  Total: 9    Past Medical History  Past Medical History:  Diagnosis Date  . Abdominal mass   . AKI (acute kidney injury) ([place]) 09/15/2014  . Atrial fibrillation ([place])   . Colitis, ulcerative ([place])   . Cough    WHITE SPUTUM FOR 2 WEEKS, NO FEVER  . Degenerative joint disease   . Diverticulosis   . DVT (deep venous thrombosis) ([place]) 2009   BOTH LUNGS  . GERD (gastroesophageal reflux disease)   . Hyperlipidemia   . Hyperplastic colon polyp   . Hypertension   . Hypothyroidism   . Iron deficiency anemia    ON IRON AT TIMES  . PE (pulmonary embolism) 2009  . Persistent atrial fibrillation ([place])   . Positive H. pylori test 01/20/96  . S/P dilatation of esophageal stricture 1992  . Skin cancer    h/o basal and squamous skin cancer--> removed    Family History  family history includes Atrial fibrillation in her father; Breast  cancer in her maternal aunt; Dementia in her mother; Diverticulitis in her brother; Esophageal cancer in her brother; Heart attack in her mother; Heart disease in her father and mother; Stroke in her mother.  Prior Rehab/Hospitalizations:  Has the patient had major surgery during 100 days prior to admission? No  Current Medications   Current Facility-Administered Medications:  .  0.9 %  sodium chloride infusion, , Intravenous, Continuous, Biby, Sharon L, NP, Last Rate: 75 mL/hr at 08/05/17 2258 .  acetaminophen (TYLENOL) tablet 650 mg, 650 mg, Oral, Q4H PRN **OR** acetaminophen (TYLENOL) solution 650 mg, 650 mg, Per Tube, Q4H PRN **OR** acetaminophen (TYLENOL) suppository 650 mg, 650 mg, Rectal, Q4H PRN, Biby, Sharon L, NP .  amiodarone (PACERONE) tablet 100 mg, 100 mg, Oral, Daily, Biby, Sharon L, NP, 100 mg at 08/06/17 0818 .  aspirin EC tablet 81 mg, 81 mg, Oral, Daily, Biby, Sharon L, NP .  atorvastatin (LIPITOR) tablet 10 mg, 10 mg, Oral, Daily, Biby, Sharon L, NP, 10 mg at 08/05/17 1819 .  carvedilol (COREG) tablet 12.5 mg, 12.5 mg, Oral, BID WC, Biby, Sharon L, NP, 12.5 mg at 08/06/17 0814 .  famotidine (PEPCID) tablet 20 mg, 20 mg, Oral, BID, Pierce, Dwayne A, RPH .  labetalol (NORMODYNE,TRANDATE) injection 10 mg, 10 mg, Intravenous, Q10 min PRN, Biby, Sharon L, NP .  levothyroxine (SYNTHROID, LEVOTHROID) tablet 75 mcg, 75 mcg, Oral, QAC breakfast, Biby, Sharon L, NP, 75 mcg at 08/06/17 0809 .  LORazepam (ATIVAN) injection 1 mg, 1 mg, Intravenous, Once, Donzetta Starch, NP, Stopped at 08/03/17 0159 .  MEDLINE mouth rinse, 15 mL, Mouth Rinse, BID, Biby, Sharon L, NP, 15 mL at 08/04/17 2208 .  senna-docusate (Senokot-S) tablet 1 tablet, 1 tablet, Oral, BID, Biby, Sharon L, NP, 1 tablet at 08/06/17 0809 .  simethicone (MYLICON) chewable tablet 80 mg, 80 mg, Oral, Q6H PRN, Burnetta Sabin L, NP, 80 mg at 08/04/17 1737 .  traZODone (DESYREL) tablet 25-50 mg, 25-50 mg, Oral, QHS PRN, Donzetta Starch, NP, 25 mg at 08/04/17 2202  Patients Current Diet:  Diet Order           Diet regular Room service appropriate? Yes; Fluid consistency: Thin  Diet effective now          Precautions / Restrictions Precautions Precautions: Fall Precaution Comments: watch BP / visual deficits Restrictions Weight Bearing Restrictions: No   Has  the patient had 2 or more falls or a fall with injury in the past year?Yes  Prior Activity Level Community (5-7x/wk): Pt is retired Marine scientist; widowed; active in [place] Devices/Equipment: (family states pt has RW and shower seat. )  Prior Device Use: Indicate devices/aids used by the patient prior to current illness, exacerbation or injury? Walker  Prior Functional Level Prior Function Level of Independence: Independent with assistive device(s) Comments: using RW for longer distance ambulation, was ambulating within room without AD   Self Care: Did the patient need help bathing, dressing, using the toilet or eating?  Independent  Indoor Mobility: Did the patient need assistance with walking from room to room (with or without device)? Independent  Stairs: Did the patient need assistance with internal or external stairs (with or without device)? Needed some help  Functional Cognition: Did the patient need help planning regular tasks such as shopping or remembering to take medications? Independent  Current Functional Level Cognition  Arousal/Alertness: Awake/alert Overall Cognitive Status: Difficult to assess Difficult to assess due to: Impaired communication Orientation Level: Oriented to person Following Commands: Follows one step commands inconsistently, Follows one step commands with increased time Safety/Judgement: Decreased awareness of safety, Decreased awareness of deficits General Comments: expressive and receptive deficits noted. Pt unable to verbalize son's name but when given two  options incr time able to select correct name. Pt perseverating when given task. pt asked to verbalize name and repeatly states maiden name. pt unable to state name with max cues. pt demonstrates inabiliyt to [employer]. pt holding phone and call bell during functional assessment in L visual field and closing R eye . pt with decr fine / gross motor R UE for task.  Attention: Focused, Sustained Focused Attention: Appears intact Sustained Attention: Appears intact Problem Solving: Impaired Problem Solving Impairment: Verbal basic Behaviors: Perseveration    Extremity Assessment (includes Sensation/Coordination)  Upper Extremity Assessment: RUE deficits/detail RUE Deficits / Details: 3 - out 5 MMT . shoulder flexion 45 degrees, grip strength 3 out 5 wiht automatic task of hand shake therapist. pt does not follow command to raise R UE/ hold R UE up/ reach with R UE. pt does automatically use R hand when presented with phone RUE Sensation: decreased proprioception RUE Coordination: decreased fine motor, decreased gross motor  Lower Extremity Assessment: Defer to PT evaluation, RLE deficits/detail RLE Deficits / Details: decreased gait length with transfer. pt unabel to pivot on R LE    ADLs  Overall ADL's : Needs assistance/impaired Eating/Feeding: Minimal assistance, Sitting Eating/Feeding Details (indicate cue type and reason): pt will need (A) with all meals due to R hand deficits and visual deficits. RN informed Grooming: Wash/dry face, Moderate assistance Grooming Details (indicate cue type and reason): decr ability to reach with R UE to face Upper Body Bathing: Moderate assistance Lower Body Bathing: Maximal assistance Upper Body Dressing : Moderate assistance Lower Body Dressing: +2 for physical assistance, +2 for safety/equipment, Sit to/from stand, Maximal assistance Lower Body Dressing Details (indicate cue type and reason): modA+2 for sit<>stand  Toilet Transfer: Moderate  assistance, +2 for safety/equipment, +2 for physical assistance, Stand-pivot, BSC, RW Toilet Transfer Details (indicate cue type and reason): multimodal cues for sequencing RW and for initiating taking steps  Toileting- Clothing Manipulation and Hygiene: Maximal assistance, +2 for safety/equipment, +2 for physical assistance, Sit to/from stand Toileting - Clothing Manipulation Details (indicate cue type and reason): steadying assist while standing at [employer]  with additional assist for brief and gown management; pt not able to void bladder at this time  Functional mobility during ADLs: +2 for physical assistance, +2 for safety/equipment, Moderate assistance General ADL Comments: pt requires (A) for bed mobility, pt with weight shift to the L to help with static sitting balance. pt transfered to chair total +2 (A). pt needs (A) to weight shift for transfer to the chair    Mobility  Overal bed mobility: Needs Assistance Bed Mobility: Supine to Sit Supine to sit: Mod assist General bed mobility comments: pt able to initiate exiting the bed on the L side but requires (A) for R trunk elevation and progressing to EOB with bed pad.     Transfers  Overall transfer level: Needs assistance Equipment used: 2 person hand held assist Transfers: Stand Pivot Transfers, Sit to/from Stand Sit to Stand: Mod assist, +2 physical assistance, +2 safety/equipment Stand pivot transfers: Mod assist, +2 physical assistance, +2 safety/equipment General transfer comment: pt attempting to stand prior to cues. pt instructed to remain seated and still attempting to stand. pt fall risk as this time. pt transfered to the chair and settled and stopped attempting to move. pt requires chair pulled behind patient due to patient only trying to advance forward and unable to follow/ demonstrate command to turnt o chair .    Ambulation / Gait / Stairs / Wheelchair Mobility  Ambulation/Gait Ambulation/Gait assistance: Mod assist, +2  physical assistance Gait Distance (Feet): 5 Feet Assistive device: Rolling walker (2 wheeled) Gait Pattern/deviations: Decreased stance time - right, Decreased step length - right, Decreased step length - left, Drifts right/left General Gait Details: Took 5 steps with RW and chair follow; manual facilitation for weight shift into R stance to allow for LLE advancement    Posture / Balance Dynamic Sitting Balance Sitting balance - Comments: Sat EOB with noted R lean and tendency for LUE to push towards Right side Balance Overall balance assessment: Needs assistance Sitting-balance support: Single extremity supported, Feet supported Sitting balance-Leahy Scale: Poor Sitting balance - Comments: Sat EOB with noted R lean and tendency for LUE to push towards Right side Postural control: Right lateral lean Standing balance support: Bilateral upper extremity supported, During functional activity Standing balance-Leahy Scale: Poor Standing balance comment: reliant on therapist for balance and advancing to chair    Special needs/care consideration BiPAP/CPAP: No CPM: No Continuous Drip IV: No Dialysis: No        Days: No Life Vest: No Oxygen: No Special Bed: No Trach Size: No Wound Vac (area): No      Location: No Skin: No areas of concern                          Location: NA Bowel mgmt: Last BM 08/05/17 Bladder mgmt: catheter in place Diabetic mgmt: No     Previous Home Environment Type of Home: [place] Additional Comments: [facility]   Discharge Living Setting Plans for Discharge Living Setting: Apartment([facility] (main building)) Type of Home at Discharge: Independent living facility(vs ALF ) [place] Name at Discharge: [facility] Discharge Home Layout: Able to live on main level with bedroom/bathroom Discharge Home Access: Level entry Discharge Bathroom Shower/Tub: Walk-in shower Discharge Bathroom Toilet: Standard Discharge Bathroom  Accessibility: Yes(with wc and RW ) How Accessible: Accessible via walker  Social/Family/Support Systems Patient Roles: Other (Comment)(widow) Contact Information: 3 sons (POA is Financial trader) Anticipated Caregiver: [facility] with 24/7 A Anticipated Caregiver's Contact Information:  [name]: [name]: [phone] Belts [phone]) Ability/Limitations of Caregiver: to be determined based on level of assistance needed; family reports she can receive 24/7 A as needed from hired help Caregiver Availability: 24/7 Discharge Plan Discussed with Primary Caregiver: Yes(with pt and 3 sons) Is Caregiver In Agreement with Plan?: Yes Does Caregiver/Family have Issues with Lodging/Transportation while Pt is in Rehab?: No   Goals/Additional Needs Patient/Family Goal for Rehab: PT/OT: Supervision/Min A; SLP: Min A Expected length of stay: 16-20 days Cultural Considerations: NA Dietary Needs: regular, thin liquids Equipment Needs: TBD Special Service Needs: Pt may need assistance for self feeding Pt/Family Agrees to Admission and willing to participate: Yes Program Orientation Provided & Reviewed with Pt/Caregiver Including Roles  & Responsibilities: Yes(pt and 3 sons )  Barriers to Discharge: Other (comments)(no barriers anticipated; pt has strong family support. Pt is resident of [facility] which has tertiary care available to pt as needed )   Decrease burden of Care through IP rehab admission: TBD based on progression with therapies. AC has spoken with [facility], where pt is a current ILF resident. Social Worker at [facility] reports there should be no issues with return to [facility], regardless of level of care. Family aware that goal is to return pt to highest level of functioning through CIR with hopes for ALF setting.   Possible need for SNF placement upon discharge: Not anticipated but will be based on progression with therapies. (see statement above regarding [facility]).    Patient  Condition: This patient's medical and functional status has changed since the consult dated: 08/06/17 in which the Rehabilitation Physician determined and documented that the patient's condition is appropriate for intensive rehabilitative care in an inpatient rehabilitation facility. See "History of Present Illness" (above) for medical update. Functional changes are: Mod A x2 stand pivot transfers. Patient's medical and functional status update has been discussed with the Rehabilitation physician and patient remains appropriate for inpatient rehabilitation. Will admit to inpatient rehab today.  Preadmission Screen Completed By:  Jhonnie Garner, 08/06/2017 1:52 PM ______________________________________________________________________   Discussed status with Dr.Olivea Sonnen on 08/06/17 at 1:50PM and received telephone approval for admission today.  Admission Coordinator:  Jhonnie Garner, time 1:52PM/Date 08/06/17

## 2017-08-05 NOTE — Progress Notes (Addendum)
Daily Progress Note   Patient Name: Sara Russell       Date: 08/05/2017 DOB: November 15, 1930  Age: 84 y.o. MRN#: [id number] Attending Physician: Garvin Fila, MD Primary Care Physician: Zenia Resides, MD Admit Date: 08/02/2017  Reason for Consultation/Follow-up: Establishing goals of care  Subjective: Patient is sitting in bedside chair. Sons Sara Russell (POA), Sara Russell and Sara Russell at bedside. They state their mother was widowed 8 years ago.She retired from nursing in 1995. She lives in [facility] independent [facility]. She goes to the dining room and movies. She goes out to lunch with friends twice a week. She has used a walker since December and has fallen. They state she ha pain from arthritis.   We discussed her diagnosis, prognosis, GOC, EOL wishes disposition and options.  A detailed discussion was had today regarding advanced directives.  Concepts specific to code status, artifical feeding and hydration,  IV antibiotics and rehospitalization were discussed.  The difference between an aggressive medical intervention path and a palliative comfort care path was discussed.  Values and goals of care important to patient and family were attempted to be elicited.  The sons state they have been well updated, and understand that in 4-6 weeks, she will be reevaluated to see how she is progressing. They are concerned for the quality of life she will have moving forward. They state she would never want to live in a nursing home. She loves to talk and they are concerned at this time she is having great difficulty with speech. They are concerned that her previously suboptimal functionality will further decline. At this time, she is tolerating a regular diet.     We discussed the contents her current MOST, including  CPR for 20 minutes. They state she was ill 3 years ago and became septic. Around that time, she made it clear she did not want to be coded again, but changed her mind about a year ago when she was doing pretty well. They are hopeful with rehab she will recover.  They state she would not be happy with her current quality of life if this current state would not improve.   New MOST from completed today based on sons' decisions.They would like DNR status with no chest compressions, shocks, breathing tube, or medications if her heart/ breathing stops. Limited additional  interventions checked on form. She would never want to be place on a ventilator. She would never want dialysis or a feeding tube. She is okay with IV fluids, and abx.   Length of Stay: 3  Current Medications: Scheduled Meds:  . amiodarone  100 mg Oral Daily  . atorvastatin  10 mg Oral Daily  . carvedilol  12.5 mg Oral BID WC  . levothyroxine  75 mcg Oral QAC breakfast  . LORazepam  1 mg Intravenous Once  . mouth rinse  15 mL Mouth Rinse BID  . senna-docusate  1 tablet Oral BID    Continuous Infusions: . sodium chloride 75 mL/hr at 08/05/17 1000  . famotidine (PEPCID) IV Stopped (08/05/17 0939)    PRN Meds: acetaminophen **OR** acetaminophen (TYLENOL) oral liquid 160 mg/5 mL **OR** acetaminophen, labetalol, simethicone, traZODone  Physical Exam  Constitutional: No distress.  Pulmonary/Chest: Effort normal.  Neurological: She is alert.  Unable to understand speech.   Skin: Skin is warm and dry.            Vital Signs: BP (!) 151/62   Pulse (!) 57   Temp (!) 97.5 F (36.4 C) (Oral)   Resp 20   Ht 5' 4"  (1.626 m)   Wt 70.1 kg (154 lb 8.7 oz)   SpO2 100%   BMI 26.53 kg/m  SpO2: SpO2: 100 % O2 Device: O2 Device: Room Air O2 Flow Rate:    Intake/output summary:   Intake/Output Summary (Last 24 hours) at 08/05/2017 1156 Last data filed at 08/05/2017 1000 Gross per 24 hour  Intake 1709.03 ml  Output 1850 ml  Net  -140.97 ml   LBM: Last BM Date: (PTA) Baseline Weight: Weight: 70.7 kg (155 lb 13.8 oz) Most recent weight: Weight: 70.1 kg (154 lb 8.7 oz)       Palliative Assessment/Data: 40%      Patient Active Problem List   Diagnosis Date Noted  . PAF (paroxysmal atrial fibrillation) ([place])   . Benign essential HTN   . Stage 3 chronic kidney disease ([place])   . ICH (intracerebral hemorrhage) ([place]) 08/03/2017  . Hemorrhagic stroke ([place]) 08/02/2017  . At high risk for falls 02/12/2017  . Insomnia 02/12/2017  . Pulmonary nodules/lesions, multiple 01/29/2017  . Acute pain of right shoulder 01/28/2017  . Hypothyroidism 04/04/2015  . Compression fracture of L1 lumbar vertebra ([facility]) 02/23/2015  . Cholelithiases 02/23/2015  . Encounter for palliative care   . Chronic diastolic congestive heart failure ([place])   . Osteoporosis 05/13/2014  . Gout 08/16/2013  . Hyponatremia 08/10/2013  . Left atrial enlargement 01/10/2013  . A-fib ([place]) 12/08/2012  . Rosacea 05/15/2012  . Anemia due to chronic blood loss 08/28/2010  . DEGENERATIVE DISC DISEASE, LUMBAR SPINE 07/14/2009  . GERD 07/11/2009  . PERSONAL HX COLONIC POLYPS 07/11/2009  . HYPERCHOLESTEROLEMIA 04/10/2006  . HYPERTENSION, BENIGN SYSTEMIC 04/10/2006  . History of pulmonary embolism 04/10/2006  . DIVERTICULOSIS OF COLON 04/10/2006  . ACTINIC KERATOSIS 04/10/2006  . GEN OSTEOARTHROSIS INVOLVING MULTIPLE SITES 04/10/2006    Palliative Care Assessment & Plan   Patient Profile: Sara Russell an 84 y.o.femalewho presents via EMS to the ED with sudden onset of right facial droop, right sided weakness and aphasia. She was admitted for CVA.    Assessment/Recommendations/Plan:  Code status changed to DNR. New MOST form completed and in chart.   Recommend  palliative to follow outpatient with transition to hospice depending on how she progresses.     Code Status:  Code Status Orders  (From admission, onward)        Start      Ordered   08/03/17 1434  Limited resuscitation (code)  Continuous    Question Answer Comment  In the event of cardiac or respiratory ARREST: Initiate Code Blue, Call Rapid Response Yes   In the event of cardiac or respiratory ARREST: Perform CPR Yes   In the event of cardiac or respiratory ARREST: Perform Intubation/Mechanical Ventilation No   In the event of cardiac or respiratory ARREST: Use NIPPV/BiPAp only if indicated Yes   In the event of cardiac or respiratory ARREST: Administer ACLS medications if indicated No   In the event of cardiac or respiratory ARREST: Perform Defibrillation or Cardioversion if indicated Yes   Comments MOST form indicates pt only wants 20 minutes of CPR, not intubation, no dialysis and no tube feeding      08/03/17 1434    Code Status History    Date Active Date Inactive Code Status Order ID Comments User Context   08/03/2017 0029 08/03/2017 1434 DNR [id number]  Kerney Elbe, MD Inpatient   08/02/2017 2005 08/03/2017 0028 DNR [id number]  Merrily Pew, MD ED   09/27/2014 1333 10/07/2014 1358 DNR [id number]  Bary Leriche, PA-C Inpatient   09/27/2014 1333 09/27/2014 1333 Full Code [id number]  Bary Leriche, PA-C Inpatient   09/17/2014 1950 09/27/2014 1333 DNR [id number]  Erick Colace, NP Inpatient   09/15/2014 1458 09/17/2014 1950 Partial Code [id number]  Tonette Bihari, MD Inpatient   08/02/2013 1308 08/06/2013 1829 Full Code [id number]  Gerda Diss, DO Inpatient       Prognosis:   Unable to determine  Discharge Planning:  To Be Determined   Thank you for allowing the Palliative Medicine Team to assist in the care of this patient.   Time In: 11:00 Time Out: 12:10 Total Time 70 min Prolonged Time Billed  yes      Greater than 50%  of this time was spent counseling and coordinating care related to the above assessment and plan.  Asencion Gowda, NP  Please contact Palliative Medicine Team phone at [phone] for questions and concerns.

## 2017-08-05 NOTE — Progress Notes (Signed)
FPTS Interim Progress Note  S: Patient sitting up in chair, awake and alert. She responds to her name and denies she is in any pain but does not answer other questions.  O: BP (!) 156/83   Pulse (!) 57   Temp 97.9 F (36.6 C) (Axillary)   Resp (!) 23   Ht 5' 4"  (1.626 m)   Wt 154 lb 8.7 oz (70.1 kg)   SpO2 94%   BMI 26.53 kg/m     A/P: Continue to follow as CCM has provided great care for this patient. Palliative is also following and appreciate their recommendations and meeting with family today at 11am to further discuss goals of care.  Nuala Alpha, DO 08/05/2017, 9:40 AM PGY-1, [facility] pager [phone]

## 2017-08-05 NOTE — Progress Notes (Signed)
Physical Therapy Treatment Patient Details Name: Sara Russell MRN: [id number] DOB: 12-13-1930 Today's Date: 08/05/2017    History of Present Illness Patient is an 84 year old female admitted with acute left thalamus/IC/basal ganglia hemorrhage.  PMH positive for a-fib, DVT/PE, HLD, DJD, HTN, skin CA and osteoporosis.    PT Comments    Continuing work on functional mobility and activity tolerance;  Per discussion with RN, Sara Russell has been sleepier today than yesterday; She woke up well for PT session, verbalized minimally this session, seems less than yesterday's notes indicate; Today, Sara Russell presents with decr sitting balance, decr midline orientation; Sitting EOB she exhibits characteristics of pusher syndrome, pushing with stronger LUE toward R side; In standing, facilitated weight shift to R single limb stance to allow for stepping; took 5 strides  This session with UE support on RW (assist for R hand positioning on RW); Her overall functional mobility still points towards CIR for post-acute rehab from a PT standpoint -- noted also family meeting with Palliative Care team, and will of course take results of meeting into consideration as well  Follow Up Recommendations  CIR     Equipment Recommendations  Other (comment)(TBA)    Recommendations for Other Services Rehab consult     Precautions / Restrictions Precautions Precautions: Fall    Mobility  Bed Mobility Overal bed mobility: Needs Assistance Bed Mobility: Supine to Sit     Supine to sit: Mod assist;HOB elevated;+2 for physical assistance     General bed mobility comments: assist to initiate with legs off bed and assist for lifting trunk; noted R lean at initial sit which persisted with sitting EOB  Transfers Overall transfer level: Needs assistance Equipment used: None;Rolling walker (2 wheeled) Transfers: Stand Pivot Transfers;Sit to/from Stand Sit to Stand: Mod assist;+2 safety/equipment Stand pivot  transfers: Mod assist;+2 physical assistance       General transfer comment: Heavy mod assist with basic stand pivot transfer bed to chair placed on pt's R side; noted decr weigh acceptance RLE during pivot transfer; from chair, performed sit to stand to RW with light mod assist and tactile cueing to incr weight acceptance RLE; assist to place RUE on RW  Ambulation/Gait Ambulation/Gait assistance: Mod assist;+2 physical assistance Gait Distance (Feet): 5 Feet Assistive device: Rolling walker (2 wheeled) Gait Pattern/deviations: Decreased stance time - right;Decreased step length - right;Decreased step length - left;Drifts right/left     General Gait Details: Took 5 steps with RW and chair follow; manual facilitation for weight shift into R stance to allow for LLE advancement   Stairs             Wheelchair Mobility    Modified Rankin (Stroke Patients Only) Modified Rankin (Stroke Patients Only) Pre-Morbid Rankin Score: Moderate disability Modified Rankin: Moderately severe disability     Balance Overall balance assessment: Needs assistance;History of Falls Sitting-balance support: Feet supported Sitting balance-Leahy Scale: Poor Sitting balance - Comments: Sat EOB with noted R lean and tendency for LUE to push towards Right side   Standing balance support: Bilateral upper extremity supported Standing balance-Leahy Scale: Poor Standing balance comment: standing leans to R                             Cognition Arousal/Alertness: Awake/alert Behavior During Therapy: Flat affect Overall Cognitive Status: Difficult to assess  General Comments: expressive aphasia; follows commands with mild delay      Exercises      General Comments General comments (skin integrity, edema, etc.): VSS on Room Air      Pertinent Vitals/Pain Pain Assessment: Faces Faces Pain Scale: Hurts a little bit Pain Location: Noted  grimace with R UR elevation; sons report she had recent clavicular fx 2 months ago; grimace subsided quickly Pain Descriptors / Indicators: Grimacing Pain Intervention(s): Monitored during session    Home Living                      Prior Function            PT Goals (current goals can now be found in the care plan section) Acute Rehab PT Goals Patient Stated Goal: did not state this session; agreeable to OOB PT Goal Formulation: With patient/family Time For Goal Achievement: 08/18/17 Potential to Achieve Goals: Good Progress towards PT goals: Progressing toward goals    Frequency    Min 4X/week      PT Plan Current plan remains appropriate    Co-evaluation              AM-PAC PT "6 Clicks" Daily Activity  Outcome Measure  Difficulty turning over in bed (including adjusting bedclothes, sheets and blankets)?: A Lot Difficulty moving from lying on back to sitting on the side of the bed? : Unable Difficulty sitting down on and standing up from a chair with arms (e.g., wheelchair, bedside commode, etc,.)?: Unable Help needed moving to and from a bed to chair (including a wheelchair)?: A Lot Help needed walking in hospital room?: A Lot Help needed climbing 3-5 steps with a railing? : Total 6 Click Score: 9    End of Session Equipment Utilized During Treatment: Gait belt Activity Tolerance: Patient tolerated treatment well Patient left: in chair;with call bell/phone within reach;with chair alarm set Nurse Communication: Mobility status PT Visit Diagnosis: Other abnormalities of gait and mobility (R26.89);Hemiplegia and hemiparesis Hemiplegia - Right/Left: Right Hemiplegia - dominant/non-dominant: Dominant Hemiplegia - caused by: Nontraumatic intracerebral hemorrhage     Time: 9924-2683 PT Time Calculation (min) (ACUTE ONLY): 18 min  Charges:  $Gait Training: 8-22 mins                    G Codes:       Sara Russell, PT  Acute Rehabilitation  Services Pager [phone] Office [place] 08/05/2017, 11:25 AM

## 2017-08-05 NOTE — Progress Notes (Addendum)
STROKE TEAM PROGRESS NOTE   SUBJECTIVE (INTERVAL HISTORY) The patient's family are all at the bedside. Patient up in the chair. Sleepy this am but awake now. Off cleviprex. Expressive aphasia remains with better receptive aphasia. Ok to transfer to the floor.   OBJECTIVE Temp:  [97.9 F (36.6 C)-98.9 F (37.2 C)] 97.9 F (36.6 C) (06/25 0800) Pulse Rate:  [51-87] 57 (06/25 0900) Cardiac Rhythm: Atrial fibrillation (06/25 0800) Resp:  [13-27] 23 (06/25 0900) BP: (118-188)/(53-146) 156/83 (06/25 0900) SpO2:  [91 %-100 %] 94 % (06/25 0900)  CBC:  Recent Labs  Lab 08/02/17 1907 08/02/17 1915  WBC 7.1  --   NEUTROABS 4.9  --   HGB 11.9* 12.2  HCT 36.6 36.0  MCV 95.8  --   PLT 204  --     Basic Metabolic Panel:  Recent Labs  Lab 08/02/17 1907 08/02/17 1915  NA 130* 131*  K 3.9 3.9  CL 98* 97*  CO2 20*  --   GLUCOSE 99 98  BUN 13 13  CREATININE 1.06* 1.10*  CALCIUM 8.8*  --     Lipid Panel:     Component Value Date/Time   CHOL 89 09/16/2014 0617   TRIG 76 08/05/2017 0726   HDL 13 (L) 09/16/2014 0617   CHOLHDL 6.8 09/16/2014 0617   VLDL 36 09/16/2014 0617   LDLCALC 40 09/16/2014 0617   HgbA1c:  Lab Results  Component Value Date   HGBA1C 5.3 08/02/2013   Urine Drug Screen:     Component Value Date/Time   LABOPIA NONE DETECTED 08/02/2017 1418   COCAINSCRNUR NONE DETECTED 08/02/2017 1418   LABBENZ NONE DETECTED 08/02/2017 1418   AMPHETMU NONE DETECTED 08/02/2017 1418   THCU NONE DETECTED 08/02/2017 1418   LABBARB (A) 08/02/2017 1418    Result not available. Reagent lot number recalled by manufacturer.    Alcohol Level     Component Value Date/Time   ETH <10 08/02/2017 1907    IMAGING  Ct Head Code Stroke Wo Contrast 08/02/2017   IMPRESSION:  1. Acute hemorrhage centered within left thalamus/posterior limb of internal capsule measuring up to 1.9 cm, 2.5 cc. Mild local mass effect.  2. Stable chronic microvascular ischemic changes and parenchymal  volume loss of the brain.  3. ASPECTS is not applicable.   Mr Brain Wo Contrast 08/03/2017 IMPRESSION:  1. Similar size of acute left thalamic intraparenchymal hemorrhage, measuring 1.9 x 2.0 x 2.5 cm. Mildly increased localized edema with trace left-to-right midline shift. No intraventricular extension, hydrocephalus, or other complication identified. No underlying mass or other abnormality.  2. Two subcentimeter subacute ischemic cortical infarcts involving the left frontal and parietooccipital regions as above.  3. Additional scattered chronic micro hemorrhages involving the cerebral and cerebellar hemispheres, favored to be secondary to chronic uncontrolled hypertension.  4. Atrophy with chronic microvascular ischemic disease.   CT Head Wo Contrast  08/03/2017 2043 1. Stable acute hemorrhage centered within left thalamus. Mild increase in edema and mass effect from 08/02/2017 with 4 mm left-to-right midline shift, previously 3 mm. No herniation. 2. No new acute intracranial abnormality. 3. Stable chronic microvascular ischemic changes and parenchymal volume loss of the brain.   Dg Pelvis Portable 08/03/2017 IMPRESSION:  No acute osseous abnormality.   Dg Femur [place], [place] 2 Views Right 08/03/2017 IMPRESSION:  1. No acute osseous abnormality.  2. Mild irregularity of the right inferior pubic ramus is questionable for fracture.   Transthoracic Echocardiogram - Left ventricle: The cavity size was normal. Wall  thickness was normal. Systolic function was normal. the estimated ejection fraction was in the range of 60% to 65%. Wall motion was normal; there were no regional wall motion abnormalities. Features are consistent with a pseudonormal left ventricular filling pattern, with concomitant abnormal relaxation and increased filling pressure (grade 2 diastolic dysfunction). - Mitral valve: Calcified annulus. - Tricuspid valve: There was mild-moderate regurgitation directed centrally. -  Pulmonary arteries: Systolic pressure was mildly increased. PA peak pressure: 41 mm Hg (S).  Bilateral Carotid Dopplers - pending 00/00/00   PHYSICAL EXAM   Frail elderly Caucasian lady currently not in distress. . Afebrile. Head is nontraumatic. Neck is supple without bruit.    Cardiac exam no murmur or gallop. Lungs are clear to auscultation. Distal pulses are well felt. Neurological Exam :  Patient is awake .She does  speak  But has mild mixed expressive and receptive aphasia with  Word salad and almost gibberish speech and trouble understanding two-step and complex commands  . She has left gaze preference and does   cross to the right past midline. Pupils small sluggishly reactive. Fundi could not be visualized. Mild right lower facial weakness. Tongue midline.mild right hemiplegia 3/5 strength with weakness of right grip. Normal strength on thethe left side. Tone is reduced in the right and normal on  Left side. Right sided reflexes are depressed left-sided of preserved. Left plantar downgoing right upgoing. Gait not tested.   ASSESSMENT/PLAN Ms. JAI BEAR is a 84 y.o. female with history of atrial fibrillation (Eliquis PTA), congestive heart failure, pulmonary embolism hx, hypothyroidism, hypertension, hyperlipidemia, gastroesophageal reflux disease, DVT history, and history of an abdominal mass presenting with sudden onset of right facial droop, right-sided weakness, and aphasia. She did not receive IV t-PA due to [facility]  Hemorrhagic stroke  Resultant  Aphasia and right hemiplegia  CT head - Acute hemorrhage centered within left thalamus/posterior limb of internal capsule measuring up to 1.9 cm, 2.5 cc.  MRI head -  Similar size of acute left thalamic intraparenchymal hemorrhage, measuring 1.9 x 2.0 x 2.5 cm. Mildly increased localized edema with trace left-to-right midline shift.. Two subcentimeter subacute ischemic cortical infarcts involving the left frontal and parietooccipital  regions  Repeat CT Head stable  Carotid Doppler - pending  2D Echo - EF 60-65%. No source of embolus   LDL - not performed  HgbA1c - 5.3  VTE prophylaxis - SCDs  Eliquis (apixaban) daily prior to admission, now on No antithrombotic. Plan to start aspirin 81 mg daily at time of d/c. After 1 month, repeat CT. If hemorrhage resolving, consider resumption of eliquis.  Ongoing aggressive stroke risk factor management  Therapy recommendations:  CIR. Admissions coordinator is following  Disposition:  Pending (at [facility])  She was made a limited code d/t pt/ family request  Atrial Fibrillation  Home anticoagulation:  Eliquis (apixaban) daily   Off AC d/t hmg  Can consider Watchman device in the future if desired   Hypertension  Now off Cleviprex  BP goal < 180 . Long-term BP goal normotensive  Hyperlipidemia  Lipid lowering medication PTA: Lipitor 10 mg daily  LDL - not performed, goal < 70  Current lipid lowering medication:  Resume lipitor 10  Resume statin at discharge  Other Stroke Risk Factors  Advanced age  Former cigarette smoker - quit > 50 years ago  ETOH use, advised to drink no more than 1 alcoholic beverage per day.  Hx stroke/TIA by mother imaging  Family hx stroke (mother)  CHF history  Other Active Problems  Mild hyponatremia  Mildly elevated creatinine 1.1  PLAN  xfer to the floor  F/u carotid doppler  Await CIR - pt medically ready for transfer when bed available.  Hospital day # 3  Burnetta Sabin, MSN, APRN, ANVP-BC, AGPCNP-BC Advanced Practice Stroke Nurse [place] for Schedule & Pager information 08/05/2017 9:37 AM  I have personally examined this patient, reviewed notes, independently viewed imaging studies, participated in medical decision making and plan of care.ROS completed by me personally and pertinent positives fully documented  I have made any additions or  clarifications directly to the above note. Agree with note above.  patient's thalamic hemorrhage appears to be stable. She is showing neurological improvement. Recommend strict blood pressure control with systolic blood pressure goal below 180.  Mobilize out of bed and therapy consults.  . Patient likely need transfer to rehabilitation. Long discussion of the bedside with the patient's son and son-in-law and answered questions about her care. She is at high risk for recurrent strokes but cannot be on anticoagulation given large acute  intracranial hemorrhage. May start aspirin in a few days prior to discharge.This patient is critically ill and at significant risk of neurological worsening, death and care requires constant monitoring of vital signs, hemodynamics,respiratory and cardiac monitoring, extensive review of multiple databases, frequent neurological assessment, discussion with family, other specialists and medical decision making of high complexity.I have made any additions or clarifications directly to the above note.This critical care time does not reflect procedure time, or teaching time or supervisory time of PA/NP/Med Resident etc but could involve care discussion time.  I spent 30 minutes of neurocritical care time  in the care of  this patient.      Antony Contras, MD Medical Director [facility] Pager: [phone] 08/05/2017 9:37 AM   To contact Stroke Continuity provider, please refer to [email or web address] After hours, contact General Neurology

## 2017-08-05 NOTE — Progress Notes (Signed)
Inpatient Rehabilitation-Admissions Coordinator   Spoke with family regarding dispo decision. Family plans to meet with [facility] this afternoon to hear what they can provide and get back to [facility] tomorrow morning with decision for CIR vs SNF. AC updated CSW. Please call if questions.   Jhonnie Garner, OTR/L  Rehab Admissions Coordinator  [phone] 08/05/2017 3:11 PM

## 2017-08-06 ENCOUNTER — Encounter (HOSPITAL_COMMUNITY): Payer: Self-pay | Admitting: *Deleted

## 2017-08-06 ENCOUNTER — Inpatient Hospital Stay (HOSPITAL_COMMUNITY)
Admission: RE | Admit: 2017-08-06 | Discharge: 2017-08-27 | DRG: 057 | Disposition: A | Discharge status: Skilled Nursing Facility | Payer: Medicare Other | Source: Intra-hospital | Attending: Physical Medicine & Rehabilitation | Admitting: Physical Medicine & Rehabilitation

## 2017-08-06 ENCOUNTER — Other Ambulatory Visit: Payer: Self-pay

## 2017-08-06 DIAGNOSIS — I69120 Aphasia following nontraumatic intracerebral hemorrhage: Secondary | ICD-10-CM

## 2017-08-06 DIAGNOSIS — N183 Chronic kidney disease, stage 3 unspecified: Secondary | ICD-10-CM | POA: Diagnosis present

## 2017-08-06 DIAGNOSIS — E876 Hypokalemia: Secondary | ICD-10-CM | POA: Diagnosis present

## 2017-08-06 DIAGNOSIS — Z8249 Family history of ischemic heart disease and other diseases of the circulatory system: Secondary | ICD-10-CM

## 2017-08-06 DIAGNOSIS — I5032 Chronic diastolic (congestive) heart failure: Secondary | ICD-10-CM | POA: Diagnosis present

## 2017-08-06 DIAGNOSIS — Z85828 Personal history of other malignant neoplasm of skin: Secondary | ICD-10-CM | POA: Diagnosis not present

## 2017-08-06 DIAGNOSIS — M1990 Unspecified osteoarthritis, unspecified site: Secondary | ICD-10-CM | POA: Diagnosis present

## 2017-08-06 DIAGNOSIS — I610 Nontraumatic intracerebral hemorrhage in hemisphere, subcortical: Secondary | ICD-10-CM | POA: Diagnosis not present

## 2017-08-06 DIAGNOSIS — Z96652 Presence of left artificial knee joint: Secondary | ICD-10-CM | POA: Diagnosis present

## 2017-08-06 DIAGNOSIS — I69192 Facial weakness following nontraumatic intracerebral hemorrhage: Secondary | ICD-10-CM

## 2017-08-06 DIAGNOSIS — Z7901 Long term (current) use of anticoagulants: Secondary | ICD-10-CM

## 2017-08-06 DIAGNOSIS — Z823 Family history of stroke: Secondary | ICD-10-CM

## 2017-08-06 DIAGNOSIS — A499 Bacterial infection, unspecified: Secondary | ICD-10-CM

## 2017-08-06 DIAGNOSIS — I69122 Dysarthria following nontraumatic intracerebral hemorrhage: Secondary | ICD-10-CM

## 2017-08-06 DIAGNOSIS — I69151 Hemiplegia and hemiparesis following nontraumatic intracerebral hemorrhage affecting right dominant side: Secondary | ICD-10-CM | POA: Diagnosis not present

## 2017-08-06 DIAGNOSIS — E785 Hyperlipidemia, unspecified: Secondary | ICD-10-CM | POA: Diagnosis present

## 2017-08-06 DIAGNOSIS — Z86010 Personal history of colonic polyps: Secondary | ICD-10-CM | POA: Diagnosis not present

## 2017-08-06 DIAGNOSIS — I10 Essential (primary) hypertension: Secondary | ICD-10-CM

## 2017-08-06 DIAGNOSIS — I481 Persistent atrial fibrillation: Secondary | ICD-10-CM | POA: Diagnosis present

## 2017-08-06 DIAGNOSIS — Z91041 Radiographic dye allergy status: Secondary | ICD-10-CM

## 2017-08-06 DIAGNOSIS — D62 Acute posthemorrhagic anemia: Secondary | ICD-10-CM | POA: Diagnosis present

## 2017-08-06 DIAGNOSIS — Z803 Family history of malignant neoplasm of breast: Secondary | ICD-10-CM | POA: Diagnosis not present

## 2017-08-06 DIAGNOSIS — K5790 Diverticulosis of intestine, part unspecified, without perforation or abscess without bleeding: Secondary | ICD-10-CM | POA: Diagnosis present

## 2017-08-06 DIAGNOSIS — N390 Urinary tract infection, site not specified: Secondary | ICD-10-CM | POA: Diagnosis not present

## 2017-08-06 DIAGNOSIS — Z86718 Personal history of other venous thrombosis and embolism: Secondary | ICD-10-CM | POA: Diagnosis not present

## 2017-08-06 DIAGNOSIS — N189 Chronic kidney disease, unspecified: Secondary | ICD-10-CM

## 2017-08-06 DIAGNOSIS — I482 Chronic atrial fibrillation: Secondary | ICD-10-CM | POA: Diagnosis not present

## 2017-08-06 DIAGNOSIS — Z800 Family history of malignant neoplasm of digestive organs: Secondary | ICD-10-CM

## 2017-08-06 DIAGNOSIS — I130 Hypertensive heart and chronic kidney disease with heart failure and stage 1 through stage 4 chronic kidney disease, or unspecified chronic kidney disease: Secondary | ICD-10-CM | POA: Diagnosis present

## 2017-08-06 DIAGNOSIS — R0989 Other specified symptoms and signs involving the circulatory and respiratory systems: Secondary | ICD-10-CM | POA: Diagnosis not present

## 2017-08-06 DIAGNOSIS — M79609 Pain in unspecified limb: Secondary | ICD-10-CM | POA: Diagnosis not present

## 2017-08-06 DIAGNOSIS — Z87891 Personal history of nicotine dependence: Secondary | ICD-10-CM

## 2017-08-06 DIAGNOSIS — M2550 Pain in unspecified joint: Secondary | ICD-10-CM | POA: Diagnosis not present

## 2017-08-06 DIAGNOSIS — E039 Hypothyroidism, unspecified: Secondary | ICD-10-CM | POA: Diagnosis present

## 2017-08-06 DIAGNOSIS — Z86711 Personal history of pulmonary embolism: Secondary | ICD-10-CM

## 2017-08-06 DIAGNOSIS — I69390 Apraxia following cerebral infarction: Secondary | ICD-10-CM

## 2017-08-06 DIAGNOSIS — N289 Disorder of kidney and ureter, unspecified: Secondary | ICD-10-CM | POA: Diagnosis not present

## 2017-08-06 DIAGNOSIS — K219 Gastro-esophageal reflux disease without esophagitis: Secondary | ICD-10-CM | POA: Diagnosis present

## 2017-08-06 DIAGNOSIS — D500 Iron deficiency anemia secondary to blood loss (chronic): Secondary | ICD-10-CM

## 2017-08-06 DIAGNOSIS — Z7401 Bed confinement status: Secondary | ICD-10-CM | POA: Diagnosis not present

## 2017-08-06 DIAGNOSIS — Z882 Allergy status to sulfonamides status: Secondary | ICD-10-CM

## 2017-08-06 DIAGNOSIS — K5190 Ulcerative colitis, unspecified, without complications: Secondary | ICD-10-CM | POA: Diagnosis present

## 2017-08-06 DIAGNOSIS — I69153 Hemiplegia and hemiparesis following nontraumatic intracerebral hemorrhage affecting right non-dominant side: Secondary | ICD-10-CM | POA: Diagnosis not present

## 2017-08-06 DIAGNOSIS — Z91013 Allergy to seafood: Secondary | ICD-10-CM

## 2017-08-06 DIAGNOSIS — N179 Acute kidney failure, unspecified: Secondary | ICD-10-CM

## 2017-08-06 DIAGNOSIS — R339 Retention of urine, unspecified: Secondary | ICD-10-CM | POA: Diagnosis not present

## 2017-08-06 DIAGNOSIS — R609 Edema, unspecified: Secondary | ICD-10-CM | POA: Diagnosis not present

## 2017-08-06 DIAGNOSIS — Z79890 Hormone replacement therapy (postmenopausal): Secondary | ICD-10-CM

## 2017-08-06 DIAGNOSIS — I69320 Aphasia following cerebral infarction: Secondary | ICD-10-CM | POA: Diagnosis not present

## 2017-08-06 MED ORDER — LEVOTHYROXINE SODIUM 75 MCG PO TABS
75.0000 ug | ORAL_TABLET | Freq: Every day | ORAL | Status: DC
  Administered 2017-08-07 – 2017-08-27 (×21): 75 ug via ORAL
  Filled 2017-08-06 (×21): qty 1

## 2017-08-06 MED ORDER — SENNOSIDES-DOCUSATE SODIUM 8.6-50 MG PO TABS: 1.0000 | ORAL_TABLET | Freq: Two times a day (BID) | ORAL | Status: DC

## 2017-08-06 MED ORDER — ACETAMINOPHEN 325 MG PO TABS
650.0000 mg | ORAL_TABLET | ORAL | Status: DC | PRN
  Administered 2017-08-08 – 2017-08-26 (×25): 650 mg via ORAL
  Filled 2017-08-06 (×28): qty 2

## 2017-08-06 MED ORDER — CARVEDILOL 12.5 MG PO TABS
12.5000 mg | ORAL_TABLET | Freq: Two times a day (BID) | ORAL | Status: DC
  Administered 2017-08-06 – 2017-08-08 (×4): 12.5 mg via ORAL
  Filled 2017-08-06 (×4): qty 1

## 2017-08-06 MED ORDER — TRAZODONE HCL 50 MG PO TABS
25.0000 mg | ORAL_TABLET | Freq: Every evening | ORAL | Status: DC | PRN
  Administered 2017-08-07 – 2017-08-26 (×3): 50 mg via ORAL
  Filled 2017-08-06 (×3): qty 1

## 2017-08-06 MED ORDER — SIMETHICONE 80 MG PO CHEW
80.0000 mg | CHEWABLE_TABLET | Freq: Four times a day (QID) | ORAL | Status: DC | PRN
  Administered 2017-08-17: 80 mg via ORAL
  Filled 2017-08-06: qty 1

## 2017-08-06 MED ORDER — SENNOSIDES-DOCUSATE SODIUM 8.6-50 MG PO TABS
1.0000 | ORAL_TABLET | Freq: Two times a day (BID) | ORAL | Status: DC
  Administered 2017-08-06 – 2017-08-27 (×33): 1 via ORAL
  Filled 2017-08-06 (×40): qty 1

## 2017-08-06 MED ORDER — ACETAMINOPHEN 160 MG/5ML PO SOLN
650.0000 mg | ORAL | Status: DC | PRN
  Administered 2017-08-19 – 2017-08-20 (×2): 650 mg
  Filled 2017-08-06 (×3): qty 20.3

## 2017-08-06 MED ORDER — ASPIRIN EC 81 MG PO TBEC
81.0000 mg | DELAYED_RELEASE_TABLET | Freq: Every day | ORAL | Status: DC
  Administered 2017-08-06: 81 mg via ORAL
  Filled 2017-08-06: qty 1

## 2017-08-06 MED ORDER — ACETAMINOPHEN 650 MG RE SUPP: 650.0000 mg | RECTAL | Status: DC | PRN

## 2017-08-06 MED ORDER — ATORVASTATIN CALCIUM 10 MG PO TABS
10.0000 mg | ORAL_TABLET | Freq: Every day | ORAL | Status: DC
  Administered 2017-08-06 – 2017-08-26 (×21): 10 mg via ORAL
  Filled 2017-08-06 (×21): qty 1

## 2017-08-06 MED ORDER — AMIODARONE HCL 200 MG PO TABS
100.0000 mg | ORAL_TABLET | Freq: Every day | ORAL | Status: DC
  Administered 2017-08-07 – 2017-08-27 (×21): 100 mg via ORAL
  Filled 2017-08-06 (×21): qty 1

## 2017-08-06 MED ORDER — FAMOTIDINE 20 MG PO TABS: 20.0000 mg | ORAL_TABLET | Freq: Two times a day (BID) | ORAL | Status: DC

## 2017-08-06 MED ORDER — ASPIRIN 81 MG PO TBEC: 81.0000 mg | DELAYED_RELEASE_TABLET | Freq: Every day | ORAL | Status: DC

## 2017-08-06 NOTE — Discharge Summary (Addendum)
Stroke Discharge Summary  Patient ID: Sara Russell   MRN: [id number]      DOB: 03-08-1930  Date of Admission: 08/02/2017 Date of Discharge: 08/06/2017  Attending Physician:  Garvin Fila, MD, Stroke MD Consultant(s):   palliative care,  Delice Lesch, MD (Physical Medicine & Rehabtilitation)  Patient's PCP:  Zenia Resides, MD  Discharge Diagnoses:  Principal Problem:   ICH (intracerebral hemorrhage) ([place]) related to uncontrolled HT and eliquis  Active Problems:   HYPERCHOLESTEROLEMIA   HYPERTENSION, BENIGN SYSTEMIC   Anemia due to chronic blood loss   Hyponatremia   Encounter for palliative care   PAF (paroxysmal atrial fibrillation) ([place])   Benign essential HTN   Stage 3 chronic kidney disease ([place]) Cytotoxic edema  Past Medical History:  Diagnosis Date  . Abdominal mass   . AKI (acute kidney injury) ([place]) 09/15/2014  . Atrial fibrillation ([place])   . Colitis, ulcerative ([place])   . Cough    WHITE SPUTUM FOR 2 WEEKS, NO FEVER  . Degenerative joint disease   . Diverticulosis   . DVT (deep venous thrombosis) ([place]) 2009   BOTH LUNGS  . GERD (gastroesophageal reflux disease)   . Hyperlipidemia   . Hyperplastic colon polyp   . Hypertension   . Hypothyroidism   . Iron deficiency anemia    ON IRON AT TIMES  . PE (pulmonary embolism) 2009  . Persistent atrial fibrillation ([place])   . Positive H. pylori test 01/20/96  . S/P dilatation of esophageal stricture 1992  . Skin cancer    h/o basal and squamous skin cancer--> removed   Past Surgical History:  Procedure Laterality Date  . APPENDECTOMY    . BREAST EXCISIONAL BIOPSY Left 1992   benign  . BREAST LUMPECTOMY     benign  . DILATION AND CURETTAGE OF UTERUS     for endometrial polyps  . ENDOSCOPIC RETROGRADE CHOLANGIOPANCREATOGRAPHY (ERCP) WITH PROPOFOL N/A 03/23/2015   Procedure: ENDOSCOPIC RETROGRADE CHOLANGIOPANCREATOGRAPHY (ERCP) WITH PROPOFOL;  Surgeon: Milus Banister, MD;  Location: [facility];   Service: Endoscopy;  Laterality: N/A;  . JOINT REPLACEMENT    . ORIF ANKLE FRACTURE Left    X 2  . REPLACEMENT TOTAL KNEE     left  . SURGERY DONE TO REMOVE LEFT ANKLE HARDWARE LATER    . TONSILLECTOMY      Medications to be continued on Rehab Allergies as of 08/06/2017      Reactions   Iohexol Hives   Ivp Dye [iodinated Diagnostic Agents] Hives   Was able to tolerate Contrast Media for CT scan.   Scallops [shellfish Allergy] Nausea And Vomiting   Projectile vomiting   Shellfish-derived Products Nausea And Vomiting   Sulfa Antibiotics Hives   Sulfamethoxazole Hives      Medication List    STOP taking these medications   apixaban 2.5 MG Tabs tablet Commonly known as:  ELIQUIS   CULTURELLE PO   diphenhydramine-acetaminophen 25-500 MG Tabs tablet Commonly known as:  TYLENOL PM   furosemide 20 MG tablet Commonly known as:  LASIX     TAKE these medications   amiodarone 100 MG tablet Commonly known as:  PACERONE Take 1 tablet (100 mg total) by mouth daily.   aspirin 81 MG EC tablet Take 1 tablet (81 mg total) by mouth daily.   atorvastatin 10 MG tablet Commonly known as:  LIPITOR Take 1 tablet (10 mg total) by mouth daily.   carvedilol 12.5 MG tablet Commonly known  as:  COREG Take 1 tablet (12.5 mg total) by mouth 2 (two) times daily with a meal.   GAS RELIEF PO Take 1 tablet by mouth 2 (two) times daily.   levothyroxine 75 MCG tablet Commonly known as:  SYNTHROID, LEVOTHROID Take 1 tablet (75 mcg total) by mouth daily.   omeprazole 40 MG capsule Commonly known as:  PRILOSEC Take 1 capsule (40 mg total) by mouth daily.   senna-docusate 8.6-50 MG tablet Commonly known as:  Senokot-S Take 1 tablet by mouth 2 (two) times daily.   traZODone 50 MG tablet Commonly known as:  DESYREL Take 0.5-1 tablets (25-50 mg total) by mouth at bedtime as needed for sleep.   TYLENOL ARTHRITIS PAIN 650 MG CR tablet Generic drug:  acetaminophen Take 650 mg by mouth every  8 (eight) hours as needed for pain.       LABORATORY STUDIES CBC    Component Value Date/Time   WBC 7.1 08/02/2017 1907   RBC 3.82 (L) 08/02/2017 1907   HGB 12.2 08/02/2017 1915   HGB 12.1 06/27/2016 0943   HCT 36.0 08/02/2017 1915   HCT 35.9 06/27/2016 0943   PLT 204 08/02/2017 1907   PLT 240 06/27/2016 0943   MCV 95.8 08/02/2017 1907   MCV 95 06/27/2016 0943   MCH 31.2 08/02/2017 1907   MCHC 32.5 08/02/2017 1907   RDW 12.5 08/02/2017 1907   RDW 14.2 06/27/2016 0943   LYMPHSABS 1.2 08/02/2017 1907   MONOABS 0.7 08/02/2017 1907   EOSABS 0.2 08/02/2017 1907   BASOSABS 0.1 08/02/2017 1907   CMP    Component Value Date/Time   NA 131 (L) 08/02/2017 1915   NA 135 02/12/2017 1134   K 3.9 08/02/2017 1915   CL 97 (L) 08/02/2017 1915   CO2 20 (L) 08/02/2017 1907   GLUCOSE 98 08/02/2017 1915   BUN 13 08/02/2017 1915   BUN 14 02/12/2017 1134   CREATININE 1.10 (H) 08/02/2017 1915   CREATININE 1.39 (H) 03/21/2015 0929   CALCIUM 8.8 (L) 08/02/2017 1907   PROT 6.4 (L) 08/02/2017 1907   PROT 7.2 06/27/2016 0943   ALBUMIN 3.7 08/02/2017 1907   ALBUMIN 4.7 06/27/2016 0943   AST 21 08/02/2017 1907   ALT 13 (L) 08/02/2017 1907   ALKPHOS 74 08/02/2017 1907   BILITOT 0.6 08/02/2017 1907   BILITOT 0.4 06/27/2016 0943   GFRNONAA 47 (L) 08/02/2017 1907   GFRNONAA 54 (L) 05/18/2012 0839   GFRAA 54 (L) 08/02/2017 1907   GFRAA 63 05/18/2012 0839   COAGS Lab Results  Component Value Date   INR 1.24 08/02/2017   INR 1.24 03/20/2014   INR 1.01 07/06/2009   Lipid Panel    Component Value Date/Time   CHOL 89 09/16/2014 0617   TRIG 76 08/05/2017 0726   HDL 13 (L) 09/16/2014 0617   CHOLHDL 6.8 09/16/2014 0617   VLDL 36 09/16/2014 0617   LDLCALC 40 09/16/2014 0617   HgbA1C  Lab Results  Component Value Date   HGBA1C 5.3 08/02/2013   Urinalysis    Component Value Date/Time   COLORURINE STRAW (A) 08/02/2017 1418   APPEARANCEUR CLEAR 08/02/2017 1418   LABSPEC 1.006  08/02/2017 1418   PHURINE 7.0 08/02/2017 1418   GLUCOSEU NEGATIVE 08/02/2017 1418   HGBUR SMALL (A) 08/02/2017 1418   BILIRUBINUR NEGATIVE 08/02/2017 1418   BILIRUBINUR SMALL 08/28/2010 1018   KETONESUR NEGATIVE 08/02/2017 1418   PROTEINUR NEGATIVE 08/02/2017 1418   UROBILINOGEN 0.2 09/20/2014 1427   NITRITE  NEGATIVE 08/02/2017 1418   LEUKOCYTESUR NEGATIVE 08/02/2017 1418   Urine Drug Screen     Component Value Date/Time   LABOPIA NONE DETECTED 08/02/2017 1418   COCAINSCRNUR NONE DETECTED 08/02/2017 1418   LABBENZ NONE DETECTED 08/02/2017 1418   AMPHETMU NONE DETECTED 08/02/2017 1418   THCU NONE DETECTED 08/02/2017 1418   LABBARB (A) 08/02/2017 1418    Result not available. Reagent lot number recalled by manufacturer.    Alcohol Level    Component Value Date/Time   [facility] <10 08/02/2017 1907     SIGNIFICANT DIAGNOSTIC STUDIES Ct Head Code Stroke Wo Contrast 08/02/2017   IMPRESSION:  1. Acute hemorrhage centered within left thalamus/posterior limb of internal capsule measuring up to 1.9 cm, 2.5 cc. Mild local mass effect.  2. Stable chronic microvascular ischemic changes and parenchymal volume loss of the brain.  3. ASPECTS is not applicable.   Mr Brain Wo Contrast 08/03/2017 IMPRESSION:  1. Similar size of acute left thalamic intraparenchymal hemorrhage, measuring 1.9 x 2.0 x 2.5 cm. Mildly increased localized edema with trace left-to-right midline shift. No intraventricular extension, hydrocephalus, or other complication identified. No underlying mass or other abnormality.  2. Two subcentimeter subacute ischemic cortical infarcts involving the left frontal and parietooccipital regions as above.  3. Additional scattered chronic micro hemorrhages involving the cerebral and cerebellar hemispheres, favored to be secondary to chronic uncontrolled hypertension.  4. Atrophy with chronic microvascular ischemic disease.   CT Head Wo Contrast  08/03/2017 2043 1. Stable acute  hemorrhage centered within left thalamus. Mild increase in edema and mass effect from 08/02/2017 with 4 mm left-to-right midline shift, previously 3 mm. No herniation. 2. No new acute intracranial abnormality. 3. Stable chronic microvascular ischemic changes and parenchymal volume loss of the brain.   Dg Pelvis Portable 08/03/2017 IMPRESSION:  No acute osseous abnormality.   Dg Femur [place], [place] 2 Views Right 08/03/2017 IMPRESSION:  1. No acute osseous abnormality.  2. Mild irregularity of the right inferior pubic ramus is questionable for fracture.   Transthoracic Echocardiogram - Left ventricle: The cavity size was normal. Wall thickness wasnormal. Systolic function was normal. the estimated ejection fraction was in the range of 60% to 65%. Wall motion was normal;there were no regional wall motion abnormalities. Features are consistent with a pseudonormal left ventricular filling pattern,with concomitant abnormal relaxation and increased fillingpressure (grade 2 diastolic dysfunction). - Mitral valve: Calcified annulus. - Tricuspid valve: There was mild-moderate regurgitation directed centrally. - Pulmonary arteries: Systolic pressure was mildly increased. PA peak pressure: 41 mm Hg (S).  Bilateral Carotid Dopplers  No evidence of stenosis in bilateral carotid arteries    HISTORY OF PRESENT ILLNESS TOMMY MINICHIELLO is an 84 y.o. 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 LKW was 08/02/2017 1800, the same as time of symptom onset.  She sat down for dinner and then was noted to be "having a stroke" by companions at the table, with the above symptoms. BP prior to arrival was 221/89; CBG 86; 97% on RA; bilateral 18G were placed. The patient is on Eliquis for a-fib. Other stroke risk factors are HLD and HTN. She has a history of DVT and PE  as well.   CT showed an acute left thalamic/internal capsule/basal ganglia intraparenchymal hemorrhage.  As she was on Eliquis, it was reversed with Andexxa. She was admitted to the neuro  ICU for further evaluation and treatment    HOSPITAL COURSE Ms. CAPRI VEALS is a 84 y.o. female with history of atrial fibrillation (Eliquis PTA), congestive heart failure, pulmonary embolism hx, hypothyroidism, hypertension, hyperlipidemia, gastroesophageal reflux disease, DVT history, and history of an abdominal mass presenting with sudden onset of right facial droop, right-sided weakness, and aphasia. She did not receive IV t-PA due to [place]  Hemorrhagic stroke  Resultant  Aphasia and right hemiplegia  CT head - Acute hemorrhage centered within left thalamus/posterior limb of internal capsule measuring up to 1.9 cm, 2.5 cc.  MRI head -  Similar size of acute left thalamic intraparenchymal hemorrhage, measuring 1.9 x 2.0 x 2.5 cm. Mildly increased localized edema with trace left-to-right midline shift.. Two subcentimeter subacute ischemic cortical infarcts involving the left frontal and parietooccipital regions  Repeat CT Head stable  Carotid Doppler B ICA 1-39% stenosis, VAs antegrade   2D Echo - EF 60-65%. No source of embolus   LDL - not performed  HgbA1c - 5.3  Eliquis (apixaban) daily prior to admission. Start aspirin 81 mg daily at time of d/c to rehab. After 1 month, repeat CT. If hemorrhage resolving, consider resumption of eliquis.  Ongoing aggressive stroke risk factor management  Therapy recommendations:  CIR.   Disposition:  CIR (at [facility])  She was made a limited code d/t pt/ family request. She has a very specific living will. Palliative care involved to help pt/family make care decisions. She is a long-term pt of Dr. Andria Frames.  Atrial Fibrillation  Home anticoagulation:  Eliquis (apixaban) daily   Off AC d/t hmg  Repeat CT in 1 month/at time of  office follow-up.  If hemorrhage is resolving, consider resumption of Eliquis.  Decision will be based on patient progression/condition at that time.  Can consider Watchman device in the future if desired   Hypertension  Treated with Cleviprex in hospital  BP goal < 180  Long-term BP goal normotensive  Hyperlipidemia  Lipid lowering medication PTA: Lipitor 10 mg daily  LDL - not performed, goal < 70  Current lipid lowering medication:  Resume lipitor 10  Continue statin at discharge  Other Stroke Risk Factors  Advanced age  Former cigarette smoker - quit > 50 years ago  ETOH use, advised to drink no more than 1 alcoholic beverage per day.  Hx stroke/TIA by mother imaging  Family hx stroke (mother)  CHF history  Other Active Problems  Mild hyponatremia  Mildly elevated creatinine 1.1   DISCHARGE EXAM Blood pressure (!) 154/95, pulse 62, temperature 98.3 F (36.8 C), temperature source Oral, resp. rate 16, height 5' 4"  (1.626 m), weight 70.1 kg (154 lb 8.7 oz), SpO2 99 %. Frail elderly Caucasian lady currently not in distress. . Afebrile. Head is nontraumatic. Neck is supple without bruit.    Cardiac exam no murmur or gallop. Lungs are clear to auscultation. Distal pulses are well felt. Neurological Exam :  Patient is awake .She does  speak  But has mild mixed expressive and receptive aphasia with  Word salad and almost gibberish speech and trouble understanding two-step and complex commands  .Perseverates.  She has left gaze preference and does   cross to the right past midline. Pupils small sluggishly reactive. Fundi could not be visualized. Mild right lower facial weakness. Tongue midline.mild right hemiplegia 3/5 strength with weakness of right grip. Normal strength on thethe left side. Tone is reduced in the right and normal on  Left side. Right sided  reflexes are depressed left-sided of preserved. Left plantar downgoing right upgoing. Gait not  tested.    Discharge Diet   Diet Order           Diet regular Room service appropriate? Yes; Fluid consistency: Thin  Diet effective now         liquids  DISCHARGE PLAN  Disposition:  Transfer to [place] for ongoing PT, OT and ST  aspirin 81 mg daily for secondary stroke prevention.  We will repeat CT in the future.  Depending on findings, patient condition, consider resumption of Eliquis in 4-6 weeks.  Recommend ongoing risk factor control by Primary Care Physician at time of discharge from inpatient rehabilitation.  Follow-up Hensel, Jamal Collin, MD in 2 weeks following discharge from rehab.  Follow-up in [facility] in 4 weeks following discharge from rehab, office to schedule an appointment.   35 minutes were spent preparing discharge.  Burnetta Sabin, MSN, APRN, ANVP-BC, AGPCNP-BC Advanced Practice Stroke Nurse [place] for Schedule & Pager information 08/06/2017 2:17 PM  I have personally examined this patient, reviewed notes, independently viewed imaging studies, participated in medical decision making and plan of care.ROS completed by me personally and pertinent positives fully documented  I have made any additions or clarifications directly to the above note. Agree with note above.    Antony Contras, MD Medical Director [facility] Pager: [phone] 08/06/2017 2:29 PM

## 2017-08-06 NOTE — Consult Note (Signed)
            [facility] CM Primary Care Navigator  08/06/2017  SHAANA ACOCELLA 1930-03-16 [id number]   Went to see patient at the bedside to identify possible discharge needs but [facility] 4W 20[facility]  Noted order for EMMI Stroke calls to follow-up recovery,alreadyin place.  Primary care provider's office is listed as providing transition of care (TOC) follow-up.     For additional questions please contact:  Edwena Felty A. Zakyla Tonche, BSN, RN-BC [facility] PRIMARY CARE Navigator Cell: [phone]

## 2017-08-06 NOTE — Progress Notes (Addendum)
PMR Admission Coordinator Pre-Admission Assessment  Patient: Sara Russell is an 84 y.o., female MRN: [id number] DOB: Jun 07, 1930 Height: 5' 4"  (162.6 cm) Weight: 70.1 kg (154 lb 8.7 oz)                                                                                                                                                  Insurance Information HMO:     PPO:      PCP:      IPA:      80/20: Yes     OTHER:  PRIMARY: Medicare Part A and B      Policy#: [id number]      Subscriber: Patient CM Name:       Phone#:      Fax#:  Pre-Cert#:       Employer:  Benefits:  Phone #: Verified eligibility online on 08/05/17     Name: [employer] Eff. Date: Part A eligible 08/11/1997; Part B eligible 08/11/1997     Deduct: $1364      Out of Pocket Max: NA      Life Max: NA CIR: Covered per Medicare guidelines after yearly deductible met      SNF: days 1-20 100%; 80% 21-100 Outpatient: 80%     Co-Pay: 20% Home Health: 100%      Co-Pay: 0% DME: 80%     Co-Pay: 20% Providers: Pt's choice  SECONDARY: BCBS      Policy#: [id number]      Subscriber: Patient CM Name:       Phone#:      Fax#:  Pre-Cert#:       Employer:  Benefits:  Phone #: [phone]     Name:  Eff. Date:      Deduct:       Out of Pocket Max:       Life Max:  CIR:       SNF:  Outpatient:      Co-Pay:  Home Health:       Co-Pay:  DME:      Co-Pay:   Medicaid Application Date:       Case Manager:  Disability Application Date:       Case Worker:   Emergency Contact Information         Contact Information    Name Relation Home Work [place] Son [phone]  [phone]   Sara Russell   [phone]   Sara Russell   [phone]     Current Medical History  Patient Admitting Diagnosis: Left thalamic hemorrhage History of Present Illness: Sara Russell an 84 year old right-handed female with history of mild renal insufficiency with creatinine 7.20, chronic diastolic congestive heart failure,  atrial fibrillation maintained on amiodarone as well as Eliquis, DVT/PE, hypertension. Patient did receive inpatient rehab services August 2016 for debilitation related to congestive heart  failure. Per chart review and daughter-in-law. Patient lives alone at [facility] facility for the past 3 years. Presented 08/02/2017 with right sided weakness, facial droop and aphasia. Blood pressure 221/89. MRI reviewed showed left thalamic hemorrhage. Per report, acute left thalamic intraparenchymal hemorrhage measuring 1.9 x 2.0 x 2.5 cm. Mildly increased localized edema with trace left to right midline shift. No intraventricular extension or hydrocephalus. 2 subcentimeter subacute ischemic cortical infarct involving the left frontal and parietal occipital regions. Echocardiogram with ejection fraction of 65% no wall motion abnormalities grade 2 diastolic dysfunction. Initially maintained on Cleviprex for blood pressure control. Eliquis held secondary to intraparenchymal hemorrhage. Tolerating a regular diet. Physical occupational therapy evaluations completed with recommendations of physical medicine rehab consult. Patient is to be admitted for a conference of rehab program on 08/06/17.  Total: 9  Past Medical History      Past Medical History:  Diagnosis Date  . Abdominal mass   . AKI (acute kidney injury) ([place]) 09/15/2014  . Atrial fibrillation ([place])   . Colitis, ulcerative ([place])   . Cough    WHITE SPUTUM FOR 2 WEEKS, NO FEVER  . Degenerative joint disease   . Diverticulosis   . DVT (deep venous thrombosis) ([place]) 2009   BOTH LUNGS  . GERD (gastroesophageal reflux disease)   . Hyperlipidemia   . Hyperplastic colon polyp   . Hypertension   . Hypothyroidism   . Iron deficiency anemia    ON IRON AT TIMES  . PE (pulmonary embolism) 2009  . Persistent atrial fibrillation ([place])   . Positive H. pylori test 01/20/96  . S/P dilatation of esophageal  stricture 1992  . Skin cancer    h/o basal and squamous skin cancer--> removed    Family History  family history includes Atrial fibrillation in her father; Breast cancer in her maternal aunt; Dementia in her mother; Diverticulitis in her brother; Esophageal cancer in her brother; Heart attack in her mother; Heart disease in her father and mother; Stroke in her mother.  Prior Rehab/Hospitalizations:  Has the patient had major surgery during 100 days prior to admission? No  Current Medications   Current Facility-Administered Medications:  .  0.9 %  sodium chloride infusion, , Intravenous, Continuous, Biby, Sharon L, NP, Last Rate: 75 mL/hr at 08/05/17 2258 .  acetaminophen (TYLENOL) tablet 650 mg, 650 mg, Oral, Q4H PRN **OR** acetaminophen (TYLENOL) solution 650 mg, 650 mg, Per Tube, Q4H PRN **OR** acetaminophen (TYLENOL) suppository 650 mg, 650 mg, Rectal, Q4H PRN, Biby, Sharon L, NP .  amiodarone (PACERONE) tablet 100 mg, 100 mg, Oral, Daily, Biby, Sharon L, NP, 100 mg at 08/06/17 0818 .  aspirin EC tablet 81 mg, 81 mg, Oral, Daily, Biby, Sharon L, NP .  atorvastatin (LIPITOR) tablet 10 mg, 10 mg, Oral, Daily, Biby, Sharon L, NP, 10 mg at 08/05/17 1819 .  carvedilol (COREG) tablet 12.5 mg, 12.5 mg, Oral, BID WC, Biby, Sharon L, NP, 12.5 mg at 08/06/17 0814 .  famotidine (PEPCID) tablet 20 mg, 20 mg, Oral, BID, Pierce, Dwayne A, RPH .  labetalol (NORMODYNE,TRANDATE) injection 10 mg, 10 mg, Intravenous, Q10 min PRN, Biby, Sharon L, NP .  levothyroxine (SYNTHROID, LEVOTHROID) tablet 75 mcg, 75 mcg, Oral, QAC breakfast, Biby, Sharon L, NP, 75 mcg at 08/06/17 0809 .  LORazepam (ATIVAN) injection 1 mg, 1 mg, Intravenous, Once, Donzetta Starch, NP, Stopped at 08/03/17 0159 .  MEDLINE mouth rinse, 15 mL, Mouth Rinse, BID, Biby, Sharon L, NP, 15 mL at 08/04/17  2208 .  senna-docusate (Senokot-S) tablet 1 tablet, 1 tablet, Oral, BID, Biby, Sharon L, NP, 1 tablet at 08/06/17 0809 .  simethicone  (MYLICON) chewable tablet 80 mg, 80 mg, Oral, Q6H PRN, Burnetta Sabin L, NP, 80 mg at 08/04/17 1737 .  traZODone (DESYREL) tablet 25-50 mg, 25-50 mg, Oral, QHS PRN, Donzetta Starch, NP, 25 mg at 08/04/17 2202  Patients Current Diet:       Diet Order           Diet regular Room service appropriate? Yes; Fluid consistency: Thin  Diet effective now          Precautions / Restrictions Precautions Precautions: Fall Precaution Comments: watch BP / visual deficits Restrictions Weight Bearing Restrictions: No   Has the patient had 2 or more falls or a fall with injury in the past year?Yes  Prior Activity Level Community (5-7x/wk): Pt is retired Marine scientist; widowed; active in [place] Devices/Equipment: (family states pt has RW and shower seat. )  Prior Device Use: Indicate devices/aids used by the patient prior to current illness, exacerbation or injury? Walker  Prior Functional Level Prior Function Level of Independence: Independent with assistive device(s) Comments: using RW for longer distance ambulation, was ambulating within room without AD   Self Care: Did the patient need help bathing, dressing, using the toilet or eating?  Independent  Indoor Mobility: Did the patient need assistance with walking from room to room (with or without device)? Independent  Stairs: Did the patient need assistance with internal or external stairs (with or without device)? Needed some help  Functional Cognition: Did the patient need help planning regular tasks such as shopping or remembering to take medications? Independent  Current Functional Level Cognition  Arousal/Alertness: Awake/alert Overall Cognitive Status: Difficult to assess Difficult to assess due to: Impaired communication Orientation Level: Oriented to person Following Commands: Follows one step commands inconsistently, Follows one step commands with increased  time Safety/Judgement: Decreased awareness of safety, Decreased awareness of deficits General Comments: expressive and receptive deficits noted. Pt unable to verbalize son's name but when given two options incr time able to select correct name. Pt perseverating when given task. pt asked to verbalize name and repeatly states maiden name. pt unable to state name with max cues. pt demonstrates inabiliyt to [employer]. pt holding phone and call bell during functional assessment in L visual field and closing R eye . pt with decr fine / gross motor R UE for task.  Attention: Focused, Sustained Focused Attention: Appears intact Sustained Attention: Appears intact Problem Solving: Impaired Problem Solving Impairment: Verbal basic Behaviors: Perseveration    Extremity Assessment (includes Sensation/Coordination)  Upper Extremity Assessment: RUE deficits/detail RUE Deficits / Details: 3 - out 5 MMT . shoulder flexion 45 degrees, grip strength 3 out 5 wiht automatic task of hand shake therapist. pt does not follow command to raise R UE/ hold R UE up/ reach with R UE. pt does automatically use R hand when presented with phone RUE Sensation: decreased proprioception RUE Coordination: decreased fine motor, decreased gross motor  Lower Extremity Assessment: Defer to PT evaluation, RLE deficits/detail RLE Deficits / Details: decreased gait length with transfer. pt unabel to pivot on R LE    ADLs  Overall ADL's : Needs assistance/impaired Eating/Feeding: Minimal assistance, Sitting Eating/Feeding Details (indicate cue type and reason): pt will need (A) with all meals due to R hand deficits and visual deficits. RN informed Grooming: Wash/dry  face, Moderate assistance Grooming Details (indicate cue type and reason): decr ability to reach with R UE to face Upper Body Bathing: Moderate assistance Lower Body Bathing: Maximal assistance Upper Body Dressing : Moderate assistance Lower Body Dressing: +2  for physical assistance, +2 for safety/equipment, Sit to/from stand, Maximal assistance Lower Body Dressing Details (indicate cue type and reason): modA+2 for sit<>stand  Toilet Transfer: Moderate assistance, +2 for safety/equipment, +2 for physical assistance, Stand-pivot, BSC, RW Toilet Transfer Details (indicate cue type and reason): multimodal cues for sequencing RW and for initiating taking steps  Toileting- Clothing Manipulation and Hygiene: Maximal assistance, +2 for safety/equipment, +2 for physical assistance, Sit to/from stand Toileting - Clothing Manipulation Details (indicate cue type and reason): steadying assist while standing at RW with additional assist for brief and gown management; pt not able to void bladder at this time  Functional mobility during ADLs: +2 for physical assistance, +2 for safety/equipment, Moderate assistance General ADL Comments: pt requires (A) for bed mobility, pt with weight shift to the L to help with static sitting balance. pt transfered to chair total +2 (A). pt needs (A) to weight shift for transfer to the chair    Mobility  Overal bed mobility: Needs Assistance Bed Mobility: Supine to Sit Supine to sit: Mod assist General bed mobility comments: pt able to initiate exiting the bed on the L side but requires (A) for R trunk elevation and progressing to EOB with bed pad.     Transfers  Overall transfer level: Needs assistance Equipment used: 2 person hand held assist Transfers: Stand Pivot Transfers, Sit to/from Stand Sit to Stand: Mod assist, +2 physical assistance, +2 safety/equipment Stand pivot transfers: Mod assist, +2 physical assistance, +2 safety/equipment General transfer comment: pt attempting to stand prior to cues. pt instructed to remain seated and still attempting to stand. pt fall risk as this time. pt transfered to the chair and settled and stopped attempting to move. pt requires chair pulled behind patient due to patient only trying  to advance forward and unable to follow/ demonstrate command to turnt o chair .    Ambulation / Gait / Stairs / Wheelchair Mobility  Ambulation/Gait Ambulation/Gait assistance: Mod assist, +2 physical assistance Gait Distance (Feet): 5 Feet Assistive device: Rolling walker (2 wheeled) Gait Pattern/deviations: Decreased stance time - right, Decreased step length - right, Decreased step length - left, Drifts right/left General Gait Details: Took 5 steps with RW and chair follow; manual facilitation for weight shift into R stance to allow for LLE advancement    Posture / Balance Dynamic Sitting Balance Sitting balance - Comments: Sat EOB with noted R lean and tendency for LUE to push towards Right side Balance Overall balance assessment: Needs assistance Sitting-balance support: Single extremity supported, Feet supported Sitting balance-Leahy Scale: Poor Sitting balance - Comments: Sat EOB with noted R lean and tendency for LUE to push towards Right side Postural control: Right lateral lean Standing balance support: Bilateral upper extremity supported, During functional activity Standing balance-Leahy Scale: Poor Standing balance comment: reliant on therapist for balance and advancing to chair    Special needs/care consideration BiPAP/CPAP: No CPM: No Continuous Drip IV: No Dialysis: No        Days: No Life Vest: No Oxygen: No Special Bed: No Trach Size: No Wound Vac (area): No      Location: No Skin: No areas of concern  Location: NA Bowel mgmt: Last BM 08/05/17 Bladder mgmt: catheter in place Diabetic mgmt: No     Previous Home Environment Type of Home: [place] Additional Comments: [facility]   Discharge Living Setting Plans for Discharge Living Setting: Apartment([facility] (main building)) Type of Home at Discharge: Independent living facility(vs ALF ) [place] Name at Discharge: [facility]charge Home  Layout: Able to live on main level with bedroom/bathroom Discharge Home Access: Level entry Discharge Bathroom Shower/Tub: Walk-in shower Discharge Bathroom Toilet: Standard Discharge Bathroom Accessibility: Yes(with wc and RW ) How Accessible: Accessible via walker  Social/Family/Support Systems Patient Roles: Other (Comment)(widow) Contact Information: 3 sons (POA is Financial trader) Anticipated Caregiver: [facility] with 24/7 A Anticipated Caregiver's Contact Information: [name]: [name]: [phone]; Ronalee Belts [phone]) Ability/Limitations of Caregiver: to be determined based on level of assistance needed; family reports she can receive 24/7 A as needed from hired help Caregiver Availability: 24/7 Discharge Plan Discussed with Primary Caregiver: Yes(with pt and 3 sons) Is Caregiver In Agreement with Plan?: Yes Does Caregiver/Family have Issues with Lodging/Transportation while Pt is in Rehab?: No   Goals/Additional Needs Patient/Family Goal for Rehab: PT/OT: Supervision/Min A; SLP: Min A Expected length of stay: 16-20 days Cultural Considerations: NA Dietary Needs: regular, thin liquids Equipment Needs: TBD Special Service Needs: Pt may need assistance for self feeding Pt/Family Agrees to Admission and willing to participate: Yes Program Orientation Provided & Reviewed with Pt/Caregiver Including Roles  & Responsibilities: Yes(pt and 3 sons )  Barriers to Discharge: Other (comments)(no barriers anticipated; pt has strong family support. Pt is resident of [facility] which has tertiary care available to pt as needed )   Decrease burden of Care through IP rehab admission: TBD based on progression with therapies. AC has spoken with [facility], where pt is a current ILF resident. Social Worker at [facility] reports there should be no issues with return to [place], regardless of level of care. Family aware that goal is to return pt to highest level of functioning through CIR with  hopes for ALF setting.   Possible need for SNF placement upon discharge: Not anticipated but will be based on progression with therapies. (see statement above regarding [facility]).    Patient Condition: This patient's medical and functional status has changed since the consult dated: 08/06/17 in which the Rehabilitation Physician determined and documented that the patient's condition is appropriate for intensive rehabilitative care in an inpatient rehabilitation facility. See "History of Present Illness" (above) for medical update. Functional changes are: Mod A x2 stand pivot transfers. Patient's medical and functional status update has been discussed with the Rehabilitation physician and patient remains appropriate for inpatient rehabilitation. Will admit to inpatient rehab today.  Preadmission Screen Completed By:  Jhonnie Garner, 08/06/2017 1:52 PM ______________________________________________________________________   Discussed status with Dr.Patel on 08/06/17 at 1:50PM and received telephone approval for admission today.  Admission Coordinator:  Jhonnie Garner, time 1:52PM/Date 08/06/17         Revision History    Date/Time User Provider Type Action  08/06/2017 2:13 PM Jamse Arn, MD Physician Addend  08/06/2017 2:13 PM Jamse Arn, MD Physician Cosign  08/06/2017 1:57 PM Jhonnie Garner, [facility] Admission Coordinator Sign  View Details Report

## 2017-08-06 NOTE — Progress Notes (Signed)
Received pt. As a new admit,pt. And her family were oriented to rehab unit.Safety plan and fall prevention plan were explained

## 2017-08-06 NOTE — Progress Notes (Signed)
Patient being transferred to CIR. Report given to [facility]. Patient and family updated.

## 2017-08-06 NOTE — Progress Notes (Signed)
FPTS Interim Progress Note  S: Patient sitting up in bed, being fed by nurse and eating well. She is able to speak a little bit more this morning. Son was in the room and informed me she has qualified for CIR and will be going soon.  BP (!) 162/74 (BP Location: Right Arm)   Pulse 67   Temp 98.2 F (36.8 C) (Oral)   Resp 20   Ht 5' 4"  (1.626 m)   Wt 154 lb 8.7 oz (70.1 kg)   SpO2 98%   BMI 26.53 kg/m    A/P: Continue to follow as CCM and Neurology has provided great care for this patient. Palliative is also following and appreciate their recommendations and meeting with family yesterday to solidify goals of care.  Nuala Alpha, DO 08/06/2017, 8:39 AM PGY-1, [facility] Medicine Service pager [phone]

## 2017-08-06 NOTE — Progress Notes (Signed)
Physical Medicine and Rehabilitation Consult Reason for Consult: Decreased functional mobility Referring Physician: Dr. Leonie Man   HPI: Sara Russell is a 84 y.o. right-handed female with history of atrial fibrillation maintained on amiodarone as well as Eliquis, DVT/PE, hypertension.  Per chart review and daughter-in-law, patient lives alone at [facility] facility for the past 3 years.  Presented 08/02/2017 with right sided weakness, facial droop and aphasia.  Blood pressure 221/89.  MRI brain reviewed, showing left thalamic hemorrhage.  Per report, acute left thalamic intraparenchymal hemorrhage measuring 1.9 x 2.0 x 2.5 cm.  Mildly increased localized edema with trace left to right midline shift.  No intraventricular extension or hydrocephalus.  2 subcentimeter subacute ischemic cortical infarcts involving the left frontal and parieto-occipital regions.  Echocardiogram pending.  Neurology consulted with work-up ongoing.  Presently on Cleviprex for blood pressure control.  Eliquis remains on hold.  Tolerating a regular diet.  Physical therapy evaluation 08/04/2017 with recommendations of physical medicine rehab consult.  Review of Systems  Unable to perform ROS: Language       Past Medical History:  Diagnosis Date  . Abdominal mass   . AKI (acute kidney injury) ([place]) 09/15/2014  . Atrial fibrillation ([place])   . Colitis, ulcerative ([place])   . Cough    WHITE SPUTUM FOR 2 WEEKS, NO FEVER  . Degenerative joint disease   . Diverticulosis   . DVT (deep venous thrombosis) ([place]) 2009   BOTH LUNGS  . GERD (gastroesophageal reflux disease)   . Hyperlipidemia   . Hyperplastic colon polyp   . Hypertension   . Hypothyroidism   . Iron deficiency anemia    ON IRON AT TIMES  . PE (pulmonary embolism) 2009  . Persistent atrial fibrillation ([place])   . Positive H. pylori test 01/20/96  . S/P dilatation of esophageal stricture 1992  . Skin cancer    h/o basal and  squamous skin cancer--> removed   Past Surgical History:  Procedure Laterality Date  . APPENDECTOMY    . BREAST EXCISIONAL BIOPSY Left 1992   benign  . BREAST LUMPECTOMY     benign  . DILATION AND CURETTAGE OF UTERUS     for endometrial polyps  . ENDOSCOPIC RETROGRADE CHOLANGIOPANCREATOGRAPHY (ERCP) WITH PROPOFOL N/A 03/23/2015   Procedure: ENDOSCOPIC RETROGRADE CHOLANGIOPANCREATOGRAPHY (ERCP) WITH PROPOFOL;  Surgeon: Milus Banister, MD;  Location: [facility] END[facility]COPY;  Service: Endoscopy;  Laterality: N/A;  . JOINT REPLACEMENT    . ORIF ANKLE FRACTURE Left    X 2  . REPLACEMENT TOTAL KNEE     left  . SURGERY DONE TO REMOVE LEFT ANKLE HARDWARE LATER    . TONSILLECTOMY     Family History  Problem Relation Age of Onset  . Esophageal cancer Brother   . Diverticulitis Brother   . Heart disease Mother   . Stroke Mother   . Dementia Mother   . Heart attack Mother   . Heart disease Father   . Atrial fibrillation Father   . Breast cancer Maternal Aunt   . Colon cancer Neg Hx    Social History:  reports that she quit smoking about 56 years ago. Her smoking use included cigarettes. She started smoking about 69 years ago. She has a 6.50 pack-year smoking history. She has never used smokeless tobacco. She reports that she drinks about 3.0 oz of alcohol per week. She reports that she does not use drugs. Allergies:       Allergies  Allergen Reactions  . Iohexol Hives  .  Ivp Dye [Iodinated Diagnostic Agents] Hives    Was able to tolerate Contrast Media for CT scan.  [name] [Shellfish Allergy] Nausea And Vomiting    Projectile vomiting  . Shellfish-Derived Products Nausea And Vomiting  . Sulfa Antibiotics Hives  . Sulfamethoxazole Hives         Medications Prior to Admission  Medication Sig Dispense Refill  . acetaminophen (TYLENOL ARTHRITIS PAIN) 650 MG CR tablet Take 650 mg by mouth every 8 (eight) hours as needed for pain.     Marland Kitchen amiodarone  (PACERONE) 100 MG tablet Take 1 tablet (100 mg total) by mouth daily. 90 tablet 3  . apixaban (ELIQUIS) 2.5 MG TABS tablet Take 1 tablet (2.5 mg total) by mouth 2 (two) times daily. 180 tablet 3  . atorvastatin (LIPITOR) 10 MG tablet Take 1 tablet (10 mg total) by mouth daily. 90 tablet 3  . carvedilol (COREG) 12.5 MG tablet Take 1 tablet (12.5 mg total) by mouth 2 (two) times daily with a meal. 180 tablet 3  . diphenhydramine-acetaminophen (TYLENOL PM) 25-500 MG TABS tablet Take 1 tablet by mouth at bedtime as needed.    . furosemide (LASIX) 20 MG tablet Take 1 tablet (20 mg total) by mouth every other day as needed for fluid or edema. 45 tablet 3  . Lactobacillus Rhamnosus, GG, (CULTURELLE PO) Take 1 Dose by mouth daily.    Marland Kitchen levothyroxine (SYNTHROID, LEVOTHROID) 75 MCG tablet Take 1 tablet (75 mcg total) by mouth daily. 90 tablet 3  . omeprazole (PRILOSEC) 40 MG capsule Take 1 capsule (40 mg total) by mouth daily. 90 capsule 3  . Simethicone (GAS RELIEF PO) Take 1 tablet by mouth 2 (two) times daily.    . traZODone (DESYREL) 50 MG tablet Take 0.5-1 tablets (25-50 mg total) by mouth at bedtime as needed for sleep. 30 tablet 3    Home:    Functional History: Functional Status:  Mobility: Bed Mobility Overal bed mobility: Needs Assistance Bed Mobility: Supine to Sit Supine to sit: Mod assist, HOB elevated, +2 for physical assistance General bed mobility comments: assist to initiate with legs off bed and assist for lifting trunk Transfers Overall transfer level: Needs assistance Equipment used: Rolling walker (2 wheeled) Transfers: Sit to/from Stand, Stand Pivot Transfers Sit to Stand: Mod assist, +2 safety/equipment Stand pivot transfers: Mod assist, +2 physical assistance General transfer comment: lifting assist from EOB and assist for hand placement on walker, able to stand and step to BSC leaning to R and assist for R LE at times due to foot placement; stand and step to  recliner mod cues, +2 for balance, safety, walker management  ADL:  Cognition: Cognition Overall Cognitive Status: Difficult to assess Orientation Level: Oriented to person, Disoriented to place, Disoriented to time, Disoriented to situation Cognition Arousal/Alertness: Awake/alert Behavior During Therapy: Flat affect Overall Cognitive Status: Difficult to assess General Comments: expressive aphasia; follows commands with mild delay Difficult to assess due to: Impaired communication  Blood pressure (!) 163/142, pulse 75, temperature 98.2 F (36.8 C), temperature source Axillary, resp. rate 16, height 5' 4"  (1.626 m), weight 70.1 kg (154 lb 8.7 oz), SpO2 99 %. Physical Exam  Vitals reviewed. Constitutional: She appears well-developed and well-nourished.  HENT:  Head: Normocephalic and atraumatic.  Eyes: EOM are normal. Right eye exhibits no discharge. Left eye exhibits no discharge.  Pupils reactive to light  Neck: Normal range of motion. Neck supple. No thyromegaly present.  Cardiovascular:  Irregularly irregular  Respiratory: Effort normal  and breath sounds normal. No respiratory distress.  GI: Soft. Bowel sounds are normal. She exhibits no distension.  Musculoskeletal:  No edema or tenderness in extremities  Neurological: She is alert.  Expressive >> Receptive aphasia She did follow some basic simple commands inconsistently Right facial weakness Dysarthria Motor: RUE/RLE: 4--4/5 proximal to distal LUE/LLE: 4-4+/5 proximal to distal  Skin: Skin is warm and dry.  Psychiatric: Her speech is delayed and slurred. She is slowed.    LabResultsLast24Hours  No results found for this or any previous visit (from the past 24 hour(s)).    ImagingResults(Last48hours)  Ct Head Wo Contrast  Result Date: 08/03/2017 CLINICAL DATA:  84 y/o  F; follow-up of intracranial hemorrhage. EXAM: CT HEAD WITHOUT CONTRAST TECHNIQUE: Contiguous axial images were obtained from the  base of the skull through the vertex without intravenous contrast. COMPARISON:  08/03/2017 MRI of the head.  08/02/2017 CT of the head. FINDINGS: Brain: Stable acute hemorrhage centered within the left thalamus measuring 1.9 x 1.7 x 1.4 cm (AP x ML x CC series 3, image 14 and series 5, image 31) when compared with the prior CT of the head given differences in technique. Mild interval increase in surrounding vasogenic edema with 4 mm of left-to-right midline shift of septum pellucidum. No new acute hemorrhage, infarct, or focal mass effect. Stable background of chronic microvascular ischemic changes and parenchymal volume loss of the brain. No extra-axial collection, hydrocephalus, or effacement of basilar cisterns. Vascular: Calcific atherosclerosis of carotid siphons and vertebral arteries. No hyperdense vessel identified. Skull: Normal. Negative for fracture or focal lesion. Sinuses/Orbits: No acute finding. Other: Bilateral intra-ocular lens replacement. IMPRESSION: 1. Stable acute hemorrhage centered within left thalamus. Mild increase in edema and mass effect from 08/02/2017 with 4 mm left-to-right midline shift, previously 3 mm. No herniation. 2. No new acute intracranial abnormality. 3. Stable chronic microvascular ischemic changes and parenchymal volume loss of the brain. Electronically Signed   By: Kristine Garbe M.D.   On: 08/03/2017 20:43   Mr Brain Wo Contrast  Result Date: 08/03/2017 CLINICAL DATA:  Initial evaluation for acute intracranial hemorrhage. EXAM: MRI HEAD WITHOUT CONTRAST TECHNIQUE: Multiplanar, multiecho pulse sequences of the brain and surrounding structures were obtained without intravenous contrast. COMPARISON:  Prior CT from 08/02/2017. FINDINGS: Brain: Study moderately degraded by motion artifact. Acute intraparenchymal hematoma centered at the left thalamus/internal capsule again seen, relatively stable in size measuring approximately 1.9 x 2.0 x 2.5 cm. Localized  vasogenic edema and mass effect slightly increased from previous. Mild mass effect on the adjacent third ventricle with trace localized left-to-right shift. No intraventricular extension. No appreciable underlying mass or other abnormality. Atrophy with chronic microvascular ischemic changes. Small foci of mildly hyperintense DWI signal at the anterior left frontal lobe (series 5, image 71) as well as the left parieto-occipital region (series 5, image 59) consistent with small subacute ischemic infarcts. No other evidence for acute or subacute ischemia. Gray-white matter differentiation otherwise maintained. Several scattered chronic micro hemorrhages seen within the cerebellum, deep gray nuclei, and bilateral cerebral hemispheres, favored to be hypertensive in nature. No mass lesion. Ventricles normal in size without hydrocephalus. No extra-axial fluid collection. Incidental note made of a partially empty sella. Vascular: Major intracranial vascular flow voids are maintained. Skull and upper cervical spine: Craniocervical junction within normal limits. Upper cervical spine normal. Bone marrow signal intensity within normal limits. Scalp soft tissues unremarkable. Sinuses/Orbits: Globes and orbital soft tissues within normal limits. Patient status post ocular lens extraction bilaterally. Mild  ethmoidal sinus mucosal thickening. Paranasal sinuses are otherwise clear. No mastoid effusion. Inner ear structures normal. Other: None. IMPRESSION: 1. Similar size of acute left thalamic intraparenchymal hemorrhage, measuring 1.9 x 2.0 x 2.5 cm. Mildly increased localized edema with trace left-to-right midline shift. No intraventricular extension, hydrocephalus, or other complication identified. No underlying mass or other abnormality. 2. Two subcentimeter subacute ischemic cortical infarcts involving the left frontal and parietooccipital regions as above. 3. Additional scattered chronic micro hemorrhages involving the  cerebral and cerebellar hemispheres, favored to be secondary to chronic uncontrolled hypertension. 4. Atrophy with chronic microvascular ischemic disease. Electronically Signed   By: Jeannine Boga M.D.   On: 08/03/2017 04:45   Dg Pelvis Portable  Result Date: 08/03/2017 CLINICAL DATA:  Right hip pain EXAM: PORTABLE PELVIS 1-2 VIEWS COMPARISON:  09/18/2014 CT FINDINGS: Pubic symphysis and rami are intact. SI joints are patent. No fracture or malalignment. Vascular calcifications. IMPRESSION: No acute osseous abnormality. Electronically Signed   By: Donavan Foil M.D.   On: 08/03/2017 01:43   Dg Femur Port, Min 2 Views Right  Result Date: 08/03/2017 CLINICAL DATA:  Right hip pain today EXAM: RIGHT FEMUR PORTABLE 2 VIEW COMPARISON:  None. FINDINGS: Mild irregularity at the right inferior pubic ramus. Vascular calcification. No fracture or malalignment. Sclerosis in the subarticular proximal medial tibia. IMPRESSION: 1. No acute osseous abnormality. 2. Mild irregularity of the right inferior pubic ramus is questionable for fracture. Electronically Signed   By: Donavan Foil M.D.   On: 08/03/2017 01:45   Ct Head Code Stroke Wo Contrast  Addendum Date: 08/02/2017   ADDENDUM REPORT: 08/02/2017 19:40 ADDENDUM: Critical Value/emergent results were called by telephone at the time of interpretation on 08/02/2017 at 7:39 pm to Dr. Cheral Marker, who verbally acknowledged these results. Electronically Signed   By: Kristine Garbe M.D.   On: 08/02/2017 19:40   Result Date: 08/02/2017 CLINICAL DATA:  Code stroke.  84 y/o  F; right facial droop. EXAM: CT HEAD WITHOUT CONTRAST TECHNIQUE: Contiguous axial images were obtained from the base of the skull through the vertex without intravenous contrast. COMPARISON:  01/28/2017 CT head. FINDINGS: Brain: Acute hemorrhage centered within left thalamus/posterior limb of internal capsule measuring 1.9 x 1.7 x 1.5 cm (volume = 2.5 cm^3). Mild surrounding edema  and local mass effect with 3 mm of left-to-right midline shift of septum pellucidum. Stable background of chronic microvascular ischemic changes and parenchymal volume loss of the brain. No extra-axial hemorrhage, large acute stroke, hydrocephalus, or herniation. Vascular: Calcific atherosclerosis of the carotid siphons and vertebral arteries. Hyperdense vessel identified. Skull: Normal. Negative for fracture or focal lesion. Sinuses/Orbits: No acute finding. Other: Bilateral intra-ocular lens replacement. IMPRESSION: 1. Acute hemorrhage centered within left thalamus/posterior limb of internal capsule measuring up to 1.9 cm, 2.5 cc. Mild local mass effect. 2. Stable chronic microvascular ischemic changes and parenchymal volume loss of the brain. 3. ASPECTS is not applicable. Electronically Signed: By: Kristine Garbe M.D. On: 08/02/2017 19:29     Assessment/Plan: Diagnosis: Left thalamic hemorrhage Labs and images independently reviewed.  Records reviewed and summated above. Stroke: Continue secondary stroke prophylaxis and Risk Factor Modification listed below:   Blood Pressure Management:  Continue current medication with prn's with permisive HTN per primary team Left sided hemiparesis Motor recovery: Fluoxetine  1. Does the need for close, 24 hr/day medical supervision in concert with the patient's rehab needs make it unreasonable for this patient to be served in a less intensive setting? Potentially  2. Co-Morbidities requiring  supervision/potential complications: atrial fibrillation (Eliquis on hold, monitor HR with increased mobility), DVT/PE, HTN (monitor and provide prns in accordance with increased physical exertion and pain), hyponatremia (cont to monitor, treat if necessary), CKD (avoid nephrotoxic meds) 3. Due to bladder management, safety, skin/wound care, disease management, medication administration and patient education, does the patient require 24 hr/day rehab nursing?  Yes 4. Does the patient require coordinated care of a physician, rehab nurse, PT (1-2 hrs/day, 5 days/week), OT (1-2 hrs/day, 5 days/week) and SLP (1-2 hrs/day, 5 days/week) to address physical and functional deficits in the context of the above medical diagnosis(es)? Yes Addressing deficits in the following areas: balance, endurance, locomotion, strength, transferring, bowel/bladder control, bathing, dressing, toileting, cognition, speech, language and psychosocial support 5. Can the patient actively participate in an intensive therapy program of at least 3 hrs of therapy per day at least 5 days per week? Yes 6. The potential for patient to make measurable gains while on inpatient rehab is excellent 7. Anticipated functional outcomes upon discharge from inpatient rehab are supervision and min assist  with PT, supervision and min assist with OT, min assist with SLP. 8. Estimated rehab length of stay to reach the above functional goals is: 16-20 days. 9. Anticipated D/C setting: Other 10. Anticipated post D/C treatments: SNF 11. Overall Rehab/Functional Prognosis: good  RECOMMENDATIONS: This patient's condition is appropriate for continued rehabilitative care in the following setting: Per daughter-in-law, plans to return to SNF portion of [facility]. Believe this is reasonable after completion of medical workup.  Recommend SNF with PM&R outpt follow up. Patient has agreed to participate in recommended program. Potentially Note that insurance prior authorization may be required for reimbursement for recommended care.  Comment: Rehab Admissions Coordinator to follow up.   I have personally performed a face to face diagnostic evaluation, including, but not limited to relevant history and physical exam findings, of this patient and developed relevant assessment and plan.  Additionally, I have reviewed and concur with the physician assistant's documentation above.   Delice Lesch, MD,  ABPMR Lavon Paganini Angiulli, PA-C 08/04/2017          Revision History                        Routing History

## 2017-08-06 NOTE — H&P (Signed)
Physical Medicine and Rehabilitation Admission H&P    Chief Complaint  Patient presents with  . Code Stroke  : HPI: Sara Russell is an 84 year old right-handed female with history of mild renal insufficiency with creatinine 8.29, chronic diastolic congestive heart failure, atrial fibrillation maintained on amiodarone as well as Eliquis, DVT/PE, hypertension.  Patient did receive inpatient rehab services August 2016 for debilitation related to congestive heart failure.  Per chart review and daughter-in-law.  Patient lives alone at [facility] facility for the past 3 years.  Presented 08/02/2017 with right sided weakness, facial droop and aphasia.  Blood pressure 221/89.  MRI reviewed showed left thalamic hemorrhage.  Per report, acute left thalamic intraparenchymal hemorrhage measuring 1.9 x 2.0 x 2.5 cm.  Mildly increased localized edema with trace left to right midline shift.  No intraventricular extension or hydrocephalus.  2 subcentimeter subacute ischemic cortical infarct involving the left frontal and parietal occipital regions.  Echocardiogram with ejection fraction of 65% no wall motion abnormalities grade 2 diastolic dysfunction.  Initially maintained on Cleviprex for blood pressure control.  Eliquis held secondary to intraparenchymal hemorrhage.  Tolerating a regular diet.  Physical occupational therapy evaluations completed with recommendations of physical medicine rehab consult.  Patient was admitted for a conference of rehab program.  Review of Systems  Unable to perform ROS: Language   Past Medical History:  Diagnosis Date  . Abdominal mass   . AKI (acute kidney injury) ([place]) 09/15/2014  . Atrial fibrillation ([place])   . Colitis, ulcerative ([place])   . Cough    WHITE SPUTUM FOR 2 WEEKS, NO FEVER  . Degenerative joint disease   . Diverticulosis   . DVT (deep venous thrombosis) ([place]) 2009   BOTH LUNGS  . GERD (gastroesophageal reflux disease)   .  Hyperlipidemia   . Hyperplastic colon polyp   . Hypertension   . Hypothyroidism   . Iron deficiency anemia    ON IRON AT TIMES  . PE (pulmonary embolism) 2009  . Persistent atrial fibrillation ([place])   . Positive H. pylori test 01/20/96  . S/P dilatation of esophageal stricture 1992  . Skin cancer    h/o basal and squamous skin cancer--> removed   Past Surgical History:  Procedure Laterality Date  . APPENDECTOMY    . BREAST EXCISIONAL BIOPSY Left 1992   benign  . BREAST LUMPECTOMY     benign  . DILATION AND CURETTAGE OF UTERUS     for endometrial polyps  . ENDOSCOPIC RETROGRADE CHOLANGIOPANCREATOGRAPHY (ERCP) WITH PROPOFOL N/A 03/23/2015   Procedure: ENDOSCOPIC RETROGRADE CHOLANGIOPANCREATOGRAPHY (ERCP) WITH PROPOFOL;  Surgeon: Milus Banister, MD;  Location: [facility] END[facility]COPY;  Service: Endoscopy;  Laterality: N/A;  . JOINT REPLACEMENT    . ORIF ANKLE FRACTURE Left    X 2  . REPLACEMENT TOTAL KNEE     left  . SURGERY DONE TO REMOVE LEFT ANKLE HARDWARE LATER    . TONSILLECTOMY     Family History  Problem Relation Age of Onset  . Esophageal cancer Brother   . Diverticulitis Brother   . Heart disease Mother   . Stroke Mother   . Dementia Mother   . Heart attack Mother   . Heart disease Father   . Atrial fibrillation Father   . Breast cancer Maternal Aunt   . Colon cancer Neg Hx    Social History:  reports that she quit smoking about 56 years ago. Her smoking use included cigarettes. She started smoking about 69 years  ago. She has a 6.50 pack-year smoking history. She has never used smokeless tobacco. She reports that she drinks about 3.0 oz of alcohol per week. She reports that she does not use drugs. Allergies:  Allergies  Allergen Reactions  . Iohexol Hives  . Ivp Dye [Iodinated Diagnostic Agents] Hives    Was able to tolerate Contrast Media for CT scan.  [name] [Shellfish Allergy] Nausea And Vomiting    Projectile vomiting  . Shellfish-Derived Products Nausea And  Vomiting  . Sulfa Antibiotics Hives  . Sulfamethoxazole Hives   Medications Prior to Admission  Medication Sig Dispense Refill  . acetaminophen (TYLENOL ARTHRITIS PAIN) 650 MG CR tablet Take 650 mg by mouth every 8 (eight) hours as needed for pain.     Marland Kitchen amiodarone (PACERONE) 100 MG tablet Take 1 tablet (100 mg total) by mouth daily. 90 tablet 3  . apixaban (ELIQUIS) 2.5 MG TABS tablet Take 1 tablet (2.5 mg total) by mouth 2 (two) times daily. 180 tablet 3  . atorvastatin (LIPITOR) 10 MG tablet Take 1 tablet (10 mg total) by mouth daily. 90 tablet 3  . carvedilol (COREG) 12.5 MG tablet Take 1 tablet (12.5 mg total) by mouth 2 (two) times daily with a meal. 180 tablet 3  . diphenhydramine-acetaminophen (TYLENOL PM) 25-500 MG TABS tablet Take 1 tablet by mouth at bedtime as needed.    . furosemide (LASIX) 20 MG tablet Take 1 tablet (20 mg total) by mouth every other day as needed for fluid or edema. 45 tablet 3  . Lactobacillus Rhamnosus, GG, (CULTURELLE PO) Take 1 Dose by mouth daily.    Marland Kitchen levothyroxine (SYNTHROID, LEVOTHROID) 75 MCG tablet Take 1 tablet (75 mcg total) by mouth daily. 90 tablet 3  . omeprazole (PRILOSEC) 40 MG capsule Take 1 capsule (40 mg total) by mouth daily. 90 capsule 3  . Simethicone (GAS RELIEF PO) Take 1 tablet by mouth 2 (two) times daily.    . traZODone (DESYREL) 50 MG tablet Take 0.5-1 tablets (25-50 mg total) by mouth at bedtime as needed for sleep. 30 tablet 3    Drug Regimen Review Drug regimen was reviewed and remains appropriate with no significant issues identified  Home: Home Living Family/patient expects to be discharged to:: (ILF ) Type of Home: [place] Additional Comments: [facility]    Functional History: Prior Function Level of Independence: Independent with assistive device(s) Comments: using RW for longer distance ambulation, was ambulating within room without AD   Functional Status:  Mobility: Bed Mobility Overal bed  mobility: Needs Assistance Bed Mobility: Supine to Sit Supine to sit: Mod assist, HOB elevated, +2 for physical assistance General bed mobility comments: assist to initiate with legs off bed and assist for lifting trunk Transfers Overall transfer level: Needs assistance Equipment used: Rolling walker (2 wheeled) Transfers: Sit to/from Stand, [employer] Transfers Sit to Stand: Mod assist, +2 safety/equipment Stand pivot transfers: Mod assist, +2 physical assistance General transfer comment: lifting assist from EOB and assist for hand placement on walker, able to stand and step to BSC leaning to R and assist for R LE at times due to foot placement; stand and step to recliner mod cues, +2 for balance, safety, walker management      ADL: ADL Overall ADL's : Needs assistance/impaired Eating/Feeding: Minimal assistance, Sitting Grooming: Wash/dry face, Min guard, Sitting, Minimal assistance Grooming Details (indicate cue type and reason): intermittent minA for static sitting; cues for thoroughness and to termintate task as pt somewhat  perseverative Upper Body Bathing: Minimal assistance, Sitting Lower Body Bathing: Moderate assistance, +2 for physical assistance, +2 for safety/equipment, Sit to/from stand Upper Body Dressing : Minimal assistance, Sitting Lower Body Dressing: +2 for physical assistance, +2 for safety/equipment, Sit to/from stand, Maximal assistance Lower Body Dressing Details (indicate cue type and reason): modA+2 for sit<>stand  Toilet Transfer: Moderate assistance, +2 for safety/equipment, +2 for physical assistance, Stand-pivot, BSC, RW Toilet Transfer Details (indicate cue type and reason): multimodal cues for sequencing RW and for initiating taking steps  Toileting- Clothing Manipulation and Hygiene: Maximal assistance, +2 for safety/equipment, +2 for physical assistance, Sit to/from stand Toileting - Clothing Manipulation Details (indicate cue type and reason): steadying  assist while standing at RW with additional assist for brief and gown management; pt not able to void bladder at this time  Functional mobility during ADLs: +2 for physical assistance, +2 for safety/equipment, Rolling walker(stand pivot transfers ) General ADL Comments: pt sat EOB for simple grooming ADLs, completed stand pivot to [facility] to attempt voiding bladder prior to transfer to recliner; pt with decreased coordination, standing balance, expressive aphasia   Cognition: Cognition Overall Cognitive Status: Difficult to assess Arousal/Alertness: Awake/alert Orientation Level: Oriented to person, Disoriented to place, Disoriented to time, Disoriented to situation Attention: Focused, Sustained Focused Attention: Appears intact Sustained Attention: Appears intact Problem Solving: Impaired Problem Solving Impairment: Verbal basic Behaviors: Perseveration Cognition Arousal/Alertness: Awake/alert Behavior During Therapy: Flat affect Overall Cognitive Status: Difficult to assess General Comments: expressive aphasia; follows commands with mild delay Difficult to assess due to: Impaired communication  Physical Exam: Blood pressure (!) 188/71, pulse (!) 57, temperature 97.9 F (36.6 C), temperature source Axillary, resp. rate (!) 23, height 5' 4"  (1.626 m), weight 70.1 kg (154 lb 8.7 oz), SpO2 94 %. Physical Exam  Vitals reviewed. Constitutional: She appears well-developed and well-nourished.  HENT:  Head: Normocephalic and atraumatic.  Right facial weakness  Eyes: EOM are normal. Right eye exhibits no discharge. Left eye exhibits no discharge.  Neck: Normal range of motion. Neck supple. No thyromegaly present.  Cardiovascular:  Irregular irregular  Respiratory: Effort normal and breath sounds normal.  GI: Soft. Bowel sounds are normal. She exhibits no distension.  Musculoskeletal:  No edema or tenderness in extremities  Neurological: She is alert and oriented x0.  Expressive >>  Receptive aphasia (improving) She did follow some basic simple commands Right facial weakness Dysarthria Motor: RUE 4-/5 proximal to distal RLE: 3+/5 HF, 4/5 distally LUE: 4+/5 proximal to distal  LLE: 4-/5 HF, 4/5 distally   Skin: Skin is warm and dry.  Psychiatric: Her speech is delayed and slurred. She is agitated. Cognition and memory are impaired.   Results for orders placed or performed during the hospital encounter of 08/02/17 (from the past 48 hour(s))  Triglycerides     Status: None   Collection Time: 08/05/17  7:26 AM  Result Value Ref Range   Triglycerides 76 <150 mg/dL    Comment: Performed at [facility], [place] [address] [place], [place] [address]   Ct Head Wo Contrast  Result Date: 08/03/2017 CLINICAL DATA:  84 y/o  F; follow-up of intracranial hemorrhage. EXAM: CT HEAD WITHOUT CONTRAST TECHNIQUE: Contiguous axial images were obtained from the base of the skull through the vertex without intravenous contrast. COMPARISON:  08/03/2017 MRI of the head.  08/02/2017 CT of the head. FINDINGS: Brain: Stable acute hemorrhage centered within the left thalamus measuring 1.9 x 1.7 x 1.4 cm (AP x ML x CC series  3, image 14 and series 5, image 31) when compared with the prior CT of the head given differences in technique. Mild interval increase in surrounding vasogenic edema with 4 mm of left-to-right midline shift of septum pellucidum. No new acute hemorrhage, infarct, or focal mass effect. Stable background of chronic microvascular ischemic changes and parenchymal volume loss of the brain. No extra-axial collection, hydrocephalus, or effacement of basilar cisterns. Vascular: Calcific atherosclerosis of carotid siphons and vertebral arteries. No hyperdense vessel identified. Skull: Normal. Negative for fracture or focal lesion. Sinuses/Orbits: No acute finding. Other: Bilateral intra-ocular lens replacement. IMPRESSION: 1. Stable acute hemorrhage centered within left thalamus. Mild  increase in edema and mass effect from 08/02/2017 with 4 mm left-to-right midline shift, previously 3 mm. No herniation. 2. No new acute intracranial abnormality. 3. Stable chronic microvascular ischemic changes and parenchymal volume loss of the brain. Electronically Signed   By: Kristine Garbe M.D.   On: 08/03/2017 20:43    Medical Problem List and Plan: 1.  Right side weakness with aphasia secondary to left thalamic hemorrhage 2.  DVT Prophylaxis/Anticoagulation: SCDs. 3. Pain Management: Tylenol as needed 4. Mood: Provide emotional support 5. Neuropsych: This patient is not capable of making decisions on her own behalf. 6. Skin/Wound Care: Routine skin checks 7. Fluids/Electrolytes/Nutrition: Routine in and outs with follow-up chemistries 8.  Atrial fibrillation.  Amiodarone 100 mg daily.  Cardiac rate controlled.  Eliquis on hold due to  hemorrhage 9.  Hypertension.  Coreg 12.5 mg twice daily.  Monitor with increased mobility 10.  Chronic diastolic congestive heart failure.  Monitor for signs of fluid overload 11.  Hypothyroidism.  Synthroid 12.  Renal insufficiency.  Baseline creatinine 1.39. 13.  Hyperlipidemia.  Lipitor   Post Admission Physician Evaluation: 1. Preadmission assessment reviewed and changes made below. 2. Functional deficits secondary  to left thalamic hemorrhage. 3. Patient is admitted to receive collaborative, interdisciplinary care between the physiatrist, rehab nursing staff, and therapy team. 4. Patient's level of medical complexity and substantial therapy needs in context of that medical necessity cannot be provided at a lesser intensity of care such as a SNF. 5. Patient has experienced substantial functional loss from his/her baseline which was documented above under the "Functional History" and "Functional Status" headings.  Judging by the patient's diagnosis, physical exam, and functional history, the patient has potential for functional progress  which will result in measurable gains while on inpatient rehab.  These gains will be of substantial and practical use upon discharge  in facilitating mobility and self-care at the household level. 6. Physiatrist will provide 24 hour management of medical needs as well as oversight of the therapy plan/treatment and provide guidance as appropriate regarding the interaction of the two. 7. 24 hour rehab nursing will assist with safety, disease management, medication administration and patient education  and help integrate therapy concepts, techniques,education, etc. 8. PT will assess and treat for/with: Lower extremity strength, range of motion, stamina, balance, functional mobility, safety, adaptive techniques and equipment, wound care, coping skills, pain control, stroke education. Goals are: Min A. 9. OT will assess and treat for/with: ADL's, functional mobility, safety, upper extremity strength, adaptive techniques and equipment, wound mgt, ego support, and community reintegration.   Goals are: Min A. Therapy may proceed with showering this patient. 10. SLP will assess and treat for/with: speech, language.  Goals are: Min A. 11. Case Management and Social Worker will assess and treat for psychological issues and discharge planning. 12. Team conference will be held weekly  to assess progress toward goals and to determine barriers to discharge. 13. Patient will receive at least 3 hours of therapy per day at least 5 days per week. 14. ELOS: 12-16 days.       15. Prognosis:  good  I have personally performed a face to face diagnostic evaluation, including, but not limited to relevant history and physical exam findings, of this patient and developed relevant assessment and plan.  Additionally, I have reviewed and concur with the physician assistant's documentation above.  Delice Lesch, MD, ABPMR Lavon Paganini Angiulli, PA-C 08/05/2017

## 2017-08-06 NOTE — Care Management Note (Signed)
Case Management Note  Patient Details  Name: Sara Russell MRN: [id number] Date of Birth: 09/25/1930  Subjective/Objective:                    Action/Plan: Pt is discharging to CIR today. CM signing off.   Expected Discharge Date:  08/06/17               Expected Discharge Plan:  [place]  In-House Referral:     Discharge planning Services  CM Consult  Post Acute Care Choice:    Choice offered to:     DME Arranged:    DME Agency:     HH Arranged:    HH Agency:     Status of Service:  Completed, signed off  If discussed at [employer], dates discussed:    Additional Comments:  Pollie Friar, RN 08/06/2017, 2:41 PM

## 2017-08-06 NOTE — Progress Notes (Signed)
Occupational Therapy Treatment Patient Details Name: Sara Russell MRN: [id number] DOB: 10/02/1930 Today's Date: 08/06/2017    History of present illness Patient is an 84 year old female admitted with acute left thalamus/IC/basal ganglia hemorrhage.  PMH positive for a-fib, DVT/PE, HLD, DJD, HTN, skin CA and osteoporosis.   OT comments  Pt demonstrates visual, safety and balance deficits. Pt s son present and asking questions regarding outcomes and progressing toward independence in the future. Son made aware that the balance deficits in this session are very concerning in addition to deficits demonstrated below for return to independence. Recommend CIR at this time.   Follow Up Recommendations  CIR;Supervision/Assistance - 24 hour    Equipment Recommendations  Other (comment)(TBA)    Recommendations for Other Services Rehab consult    Precautions / Restrictions Precautions Precautions: Fall Precaution Comments: watch BP / visual deficits       Mobility Bed Mobility Overal bed mobility: Needs Assistance Bed Mobility: Supine to Sit     Supine to sit: Mod assist     General bed mobility comments: pt able to initiate exiting the bed on the L side but requires (A) for R trunk elevation and progressing to EOB with bed pad.   Transfers Overall transfer level: Needs assistance Equipment used: 2 person hand held assist Transfers: Stand Pivot Transfers;Sit to/from Stand Sit to Stand: Mod assist;+2 physical assistance;+2 safety/equipment Stand pivot transfers: Mod assist;+2 physical assistance;+2 safety/equipment       General transfer comment: pt attempting to stand prior to cues. pt instructed to remain seated and still attempting to stand. pt fall risk as this time. pt transfered to the chair and settled and stopped attempting to move. pt requires chair pulled behind patient due to patient only trying to advance forward and unable to follow/ demonstrate command to turnt o  chair .    Balance Overall balance assessment: Needs assistance Sitting-balance support: Single extremity supported;Feet supported Sitting balance-Leahy Scale: Poor   Postural control: Right lateral lean Standing balance support: Bilateral upper extremity supported;During functional activity Standing balance-Leahy Scale: Poor Standing balance comment: reliant on therapist for balance and advancing to chair                           ADL either performed or assessed with clinical judgement   ADL Overall ADL's : Needs assistance/impaired   Eating/Feeding Details (indicate cue type and reason): pt will need (A) with all meals due to R hand deficits and visual deficits. RN informed Grooming: Dance movement psychotherapist;Moderate assistance Grooming Details (indicate cue type and reason): decr ability to reach with R UE to face Upper Body Bathing: Moderate assistance   Lower Body Bathing: Maximal assistance   Upper Body Dressing : Moderate assistance       Toilet Transfer: Moderate assistance;+2 for safety/equipment;+2 for physical assistance;Stand-pivot;BSC;RW   Toileting- Clothing Manipulation and Hygiene: Maximal assistance;+2 for safety/equipment;+2 for physical assistance;Sit to/from stand       Functional mobility during ADLs: +2 for physical assistance;+2 for safety/equipment;Moderate assistance General ADL Comments: pt requires (A) for bed mobility, pt with weight shift to the L to help with static sitting balance. pt transfered to chair total +2 (A). pt needs (A) to weight shift for transfer to the chair     Vision   Vision Assessment?: Yes Ocular Range of Motion: Impaired-to be further tested in functional context Alignment/Gaze Preference: Head turned Tracking/Visual Pursuits: Impaired - to be further tested in functional context;Requires  cues, head turns, or add eye shifts to track Additional Comments: pt noted to occlude R eye with functional task. holds object in L  side, pt unable to locate son in R visual field. question why patient is using compensatory strategy of R eye occlusion   Perception     Praxis      Cognition Arousal/Alertness: Awake/alert Behavior During Therapy: Flat affect Overall Cognitive Status: Difficult to assess Area of Impairment: Following commands;Safety/judgement;Awareness                       Following Commands: Follows one step commands inconsistently;Follows one step commands with increased time Safety/Judgement: Decreased awareness of safety;Decreased awareness of deficits     General Comments: expressive and receptive deficits noted. Pt unable to verbalize son's name but when given two options incr time able to select correct name. Pt perseverating when given task. pt asked to verbalize name and repeatly states maiden name. pt unable to state name with max cues. pt demonstrates inabiliyt to [employer]. pt holding phone and call bell during functional assessment in L visual field and closing R eye . pt with decr fine / gross motor R UE for task.         Exercises     Shoulder Instructions       General Comments      Pertinent Vitals/ Pain       Pain Assessment: No/denies pain  Home Living                                          Prior Functioning/Environment              Frequency  Min 3X/week        Progress Toward Goals  OT Goals(current goals can now be found in the care plan section)  Progress towards OT goals: Progressing toward goals  Acute Rehab OT Goals Patient Stated Goal: repeating statements OT Goal Formulation: With patient Time For Goal Achievement: 08/18/17 Potential to Achieve Goals: Good ADL Goals Pt Will Perform Grooming: with supervision;sitting Pt Will Perform Upper Body Bathing: with supervision;sitting Pt Will Perform Lower Body Bathing: with min assist;sit to/from stand Pt Will Perform Upper Body Dressing: with  supervision;sitting Pt Will Perform Lower Body Dressing: with min assist;sit to/from stand Pt Will Transfer to Toilet: with min assist;ambulating;regular height toilet Pt Will Perform Toileting - Clothing Manipulation and hygiene: with min assist;sit to/from stand Pt/caregiver will Perform Home Exercise Program: Increased ROM;Right Upper extremity;With Supervision;With written HEP provided Additional ADL Goal #1: Pt will follow 3/3 one step commands during functional task with min cues.  Plan Discharge plan remains appropriate    Co-evaluation                 AM-PAC PT "6 Clicks" Daily Activity     Outcome Measure   Help from another person eating meals?: A Lot Help from another person taking care of personal grooming?: A Lot Help from another person toileting, which includes using toliet, bedpan, or urinal?: Total Help from another person bathing (including washing, rinsing, drying)?: Total Help from another person to put on and taking off regular upper body clothing?: Total Help from another person to put on and taking off regular lower body clothing?: Total 6 Click Score: 8    End of Session Equipment Utilized During Treatment: Gait belt  OT Visit Diagnosis: Other abnormalities of gait and mobility (R26.89);Other symptoms and signs involving the nervous system (R29.898)   Activity Tolerance Patient tolerated treatment well   Patient Left in chair;with call bell/phone within reach;with family/visitor present   Nurse Communication Mobility status;Precautions        Time: 4097-3532 OT Time Calculation (min): 23 min  Charges: OT General Charges $OT Visit: 1 Visit OT Treatments $Therapeutic Activity: 8-22 mins   Jeri Modena   OTR/L Pager: [phone] Office: [phone] .    Parke Poisson B 08/06/2017, 10:29 AM

## 2017-08-06 NOTE — Progress Notes (Signed)
Physical Therapy Treatment Patient Details Name: Sara Russell MRN: [id number] DOB: [birth year] Today's Date: 08/06/2017    History of Present Illness Patient is an 84 year old female admitted with acute left thalamus/IC/basal ganglia hemorrhage.  PMH positive for a-fib, DVT/PE, HLD, DJD, HTN, skin CA and osteoporosis.    PT Comments    Patient required +2 assist for mobility due to R side weakness and balance deficits. Continue to recommend CIR for further skilled PT services to maximize independence and safety with mobility.    Follow Up Recommendations  CIR     Equipment Recommendations  Other (comment)(TBD)    Recommendations for Other Services Rehab consult     Precautions / Restrictions Precautions Precautions: Fall Precaution Comments: watch BP / visual deficits    Mobility  Bed Mobility Overal bed mobility: Needs Assistance Bed Mobility: Sit to Supine;Sidelying to Sit;Rolling Rolling: Min assist Sidelying to sit: Mod assist;HOB elevated;+2 for safety/equipment   Sit to supine: Mod assist;+2 for physical assistance   General bed mobility comments: assist to mobilize bilat LE and elevate trunk itno sitting; use of rails  Transfers Overall transfer level: Needs assistance Equipment used: Rolling walker (2 wheeled);2 person hand held assist Transfers: Sit to/from Stand Sit to Stand: Mod assist;+2 safety/equipment         General transfer comment: cues for safe hand placement and R LE blocked; assistance to power up into standing and to gain balance in standing due to R lateral lean  Ambulation/Gait                 Stairs             Wheelchair Mobility    Modified Rankin (Stroke Patients Only) Modified Rankin (Stroke Patients Only) Pre-Morbid Rankin Score: Moderate disability Modified Rankin: Moderately severe disability     Balance Overall balance assessment: Needs assistance;History of Falls Sitting-balance support: Feet  supported;Single extremity supported Sitting balance-Leahy Scale: Poor Sitting balance - Comments: Sat EOB with noted R lean and tendency for LUE to push towards Right side   Standing balance support: Bilateral upper extremity supported Standing balance-Leahy Scale: Poor                              Cognition Arousal/Alertness: Awake/alert Behavior During Therapy: Flat affect Overall Cognitive Status: Difficult to assess Area of Impairment: Orientation;Safety/judgement;Following commands;Awareness                 Orientation Level: Disoriented to;Place;Time;Situation     Following Commands: Follows one step commands inconsistently;Follows one step commands with increased time Safety/Judgement: Decreased awareness of safety;Decreased awareness of deficits     General Comments: expressive aphasia; follows commands with mild delay      Exercises      General Comments General comments (skin integrity, edema, etc.): pt incontinent of stool with each standing trial so limited to EOB      Pertinent Vitals/Pain Pain Assessment: Faces Faces Pain Scale: Hurts little more Pain Location: R UE Pain Descriptors / Indicators: Grimacing;Guarding Pain Intervention(s): Limited activity within patient's tolerance;Monitored during session;Repositioned    Home Living                      Prior Function            PT Goals (current goals can now be found in the care plan section) Progress towards PT goals: Progressing toward goals    Frequency  Min 4X/week      PT Plan Current plan remains appropriate    Co-evaluation              AM-PAC PT "6 Clicks" Daily Activity  Outcome Measure  Difficulty turning over in bed (including adjusting bedclothes, sheets and blankets)?: Unable Difficulty moving from lying on back to sitting on the side of the bed? : Unable Difficulty sitting down on and standing up from a chair with arms (e.g., wheelchair,  bedside commode, etc,.)?: Unable Help needed moving to and from a bed to chair (including a wheelchair)?: A Lot Help needed walking in hospital room?: A Lot Help needed climbing 3-5 steps with a railing? : Total 6 Click Score: 8    End of Session Equipment Utilized During Treatment: Gait belt Activity Tolerance: Patient tolerated treatment well Patient left: with call bell/phone within reach;in bed;with bed alarm set Nurse Communication: Mobility status PT Visit Diagnosis: Other abnormalities of gait and mobility (R26.89);Hemiplegia and hemiparesis Hemiplegia - Right/Left: Right Hemiplegia - dominant/non-dominant: Dominant Hemiplegia - caused by: Nontraumatic intracerebral hemorrhage     Time: 1440-1515 PT Time Calculation (min) (ACUTE ONLY): 35 min  Charges:  $Therapeutic Activity: 23-37 mins                    G Codes:       Earney Navy, PTA Pager: [phone]     Darliss Cheney 08/06/2017, 4:19 PM

## 2017-08-06 NOTE — Progress Notes (Signed)
Inpatient Rehabilitation-Admissions Coordinator   [facility] followed up with pt and family this morning regarding preferred rehab venue. Family and pt wanting CIR. AC received medical clearance for DC to CIR. AC has bed available and pt will be admitted to CIR today. Floor RN, CM, and SW notified of plan. Please call if questions.   Jhonnie Garner, OTR/L  Rehab Admissions Coordinator  [phone] 08/06/2017 3:05 PM

## 2017-08-07 ENCOUNTER — Inpatient Hospital Stay (HOSPITAL_COMMUNITY): Payer: No Typology Code available for payment source

## 2017-08-07 ENCOUNTER — Inpatient Hospital Stay (HOSPITAL_COMMUNITY): Payer: No Typology Code available for payment source | Admitting: Occupational Therapy

## 2017-08-07 ENCOUNTER — Inpatient Hospital Stay (HOSPITAL_COMMUNITY): Payer: No Typology Code available for payment source | Admitting: Speech Pathology

## 2017-08-07 DIAGNOSIS — I482 Chronic atrial fibrillation: Secondary | ICD-10-CM

## 2017-08-07 DIAGNOSIS — E876 Hypokalemia: Secondary | ICD-10-CM

## 2017-08-07 DIAGNOSIS — I610 Nontraumatic intracerebral hemorrhage in hemisphere, subcortical: Secondary | ICD-10-CM

## 2017-08-07 DIAGNOSIS — I10 Essential (primary) hypertension: Secondary | ICD-10-CM

## 2017-08-07 DIAGNOSIS — I5032 Chronic diastolic (congestive) heart failure: Secondary | ICD-10-CM

## 2017-08-07 LAB — CBC WITH DIFFERENTIAL/PLATELET
Abs Immature Granulocytes: 0 10*3/uL (ref 0.0–0.1)
Basophils Absolute: 0.1 10*3/uL (ref 0.0–0.1)
Basophils Relative: 1 %
Eosinophils Absolute: 0.1 10*3/uL (ref 0.0–0.7)
Eosinophils Relative: 1 %
HCT: 33.1 % — ABNORMAL LOW (ref 36.0–46.0)
Hemoglobin: 11.2 g/dL — ABNORMAL LOW (ref 12.0–15.0)
Immature Granulocytes: 0 %
Lymphocytes Relative: 7 %
Lymphs Abs: 0.7 10*3/uL (ref 0.7–4.0)
MCH: 31.3 pg (ref 26.0–34.0)
MCHC: 33.8 g/dL (ref 30.0–36.0)
MCV: 92.5 fL (ref 78.0–100.0)
Monocytes Absolute: 0.8 10*3/uL (ref 0.1–1.0)
Monocytes Relative: 8 %
Neutro Abs: 8 10*3/uL — ABNORMAL HIGH (ref 1.7–7.7)
Neutrophils Relative %: 83 %
Platelets: 188 10*3/uL (ref 150–400)
RBC: 3.58 MIL/uL — ABNORMAL LOW (ref 3.87–5.11)
RDW: 12.3 % (ref 11.5–15.5)
WBC: 9.7 10*3/uL (ref 4.0–10.5)

## 2017-08-07 LAB — COMPREHENSIVE METABOLIC PANEL
ALT: 12 U/L (ref 0–44)
AST: 17 U/L (ref 15–41)
Albumin: 2.8 g/dL — ABNORMAL LOW (ref 3.5–5.0)
Alkaline Phosphatase: 71 U/L (ref 38–126)
Anion gap: 8 (ref 5–15)
BUN: 8 mg/dL (ref 8–23)
CO2: 21 mmol/L — ABNORMAL LOW (ref 22–32)
Calcium: 7.7 mg/dL — ABNORMAL LOW (ref 8.9–10.3)
Chloride: 105 mmol/L (ref 98–111)
Creatinine, Ser: 0.92 mg/dL (ref 0.44–1.00)
GFR calc Af Amer: >60 mL/min (ref >60)
GFR calc non Af Amer: 56 mL/min — ABNORMAL LOW (ref >60)
Glucose, Bld: 104 mg/dL — ABNORMAL HIGH (ref 70–99)
Potassium: 2.8 mmol/L — ABNORMAL LOW (ref 3.5–5.1)
Sodium: 134 mmol/L — ABNORMAL LOW (ref 135–145)
Total Bilirubin: 0.7 mg/dL (ref 0.3–1.2)
Total Protein: 5.7 g/dL — ABNORMAL LOW (ref 6.5–8.1)

## 2017-08-07 MED ORDER — ENSURE ENLIVE PO LIQD
237.0000 mL | Freq: Two times a day (BID) | ORAL | Status: DC
  Administered 2017-08-08 – 2017-08-27 (×35): 237 mL via ORAL

## 2017-08-07 MED ORDER — POTASSIUM CHLORIDE CRYS ER 20 MEQ PO TBCR
40.0000 meq | EXTENDED_RELEASE_TABLET | Freq: Three times a day (TID) | ORAL | Status: DC
  Administered 2017-08-07 – 2017-08-08 (×4): 40 meq via ORAL
  Filled 2017-08-07 (×3): qty 2

## 2017-08-07 NOTE — Evaluation (Signed)
Physical Therapy Assessment and Plan  Patient Details  Name: Sara MEINDERS MRN: [id number] Date of Birth: 07/01/1930  PT Diagnosis: Abnormal posture, Abnormality of gait, Cognitive deficits, Dizziness and giddiness, Hemiplegia dominant, Low back pain, Muscle spasms, Muscle weakness, Osteoarthritis and Pain in spine Rehab Potential: Good ELOS: 21-24   Today's Date: 08/07/2017 PT Individual Time: 1300  - 1415 75 min individual tx      Problem List:  Patient Active Problem List   Diagnosis Date Noted  . Thalamic hemorrhage ([place]) 08/06/2017  . Hemorrhagic stroke ([place])   . Hyperlipidemia   . PAF (paroxysmal atrial fibrillation) ([place])   . Benign essential HTN   . Stage 3 chronic kidney disease ([place])   . ICH (intracerebral hemorrhage) ([place]) 08/03/2017  . At high risk for falls 02/12/2017  . Insomnia 02/12/2017  . Pulmonary nodules/lesions, multiple 01/29/2017  . Acute pain of right shoulder 01/28/2017  . Hypothyroidism 04/04/2015  . Compression fracture of L1 lumbar vertebra (HCC) 02/23/2015  . Cholelithiases 02/23/2015  . Encounter for palliative care   . Chronic diastolic congestive heart failure ([place])   . Osteoporosis 05/13/2014  . Gout 08/16/2013  . Hyponatremia 08/10/2013  . Left atrial enlargement 01/10/2013  . A-fib ([place]) 12/08/2012  . Rosacea 05/15/2012  . Anemia due to chronic blood loss 08/28/2010  . DEGENERATIVE DISC DISEASE, LUMBAR SPINE 07/14/2009  . GERD 07/11/2009  . PERSONAL HX COLONIC POLYPS 07/11/2009  . HYPERCHOLESTEROLEMIA 04/10/2006  . HYPERTENSION, BENIGN SYSTEMIC 04/10/2006  . History of pulmonary embolism 04/10/2006  . DIVERTICULOSIS OF COLON 04/10/2006  . ACTINIC KERATOSIS 04/10/2006  . GEN OSTEOARTHROSIS INVOLVING MULTIPLE SITES 04/10/2006    Past Medical History:  Past Medical History:  Diagnosis Date  . Abdominal mass   . AKI (acute kidney injury) ([place]) 09/15/2014  . Atrial fibrillation ([place])   . Colitis, ulcerative ([place])   . Cough     WHITE SPUTUM FOR 2 WEEKS, NO FEVER  . Degenerative joint disease   . Diverticulosis   . DVT (deep venous thrombosis) ([place]) 2009   BOTH LUNGS  . GERD (gastroesophageal reflux disease)   . Hyperlipidemia   . Hyperplastic colon polyp   . Hypertension   . Hypothyroidism   . Iron deficiency anemia    ON IRON AT TIMES  . PE (pulmonary embolism) 2009  . Persistent atrial fibrillation ([place])   . Positive H. pylori test 01/20/96  . S/P dilatation of esophageal stricture 1992  . Skin cancer    h/o basal and squamous skin cancer--> removed   Past Surgical History:  Past Surgical History:  Procedure Laterality Date  . APPENDECTOMY    . BREAST EXCISIONAL BIOPSY Left 1992   benign  . BREAST LUMPECTOMY     benign  . DILATION AND CURETTAGE OF UTERUS     for endometrial polyps  . ENDOSCOPIC RETROGRADE CHOLANGIOPANCREATOGRAPHY (ERCP) WITH PROPOFOL N/A 03/23/2015   Procedure: ENDOSCOPIC RETROGRADE CHOLANGIOPANCREATOGRAPHY (ERCP) WITH PROPOFOL;  Surgeon: Milus Banister, MD;  Location: [facility] ENDOSCOPY;  Service: Endoscopy;  Laterality: N/A;  . JOINT REPLACEMENT    . ORIF ANKLE FRACTURE Left    X 2  . REPLACEMENT TOTAL KNEE     left  . SURGERY DONE TO REMOVE LEFT ANKLE HARDWARE LATER    . TONSILLECTOMY      Assessment & Plan Clinical Impression:   Patient transferred to CIR on 08/06/2017 .   Patient currently requires total with mobility secondary to muscle weakness and muscle joint tightness, decreased cardiorespiratoy endurance,  impaired timing and sequencing, abnormal tone, unbalanced muscle activation, decreased coordination and decreased motor planning, decreased initiation, decreased attention, decreased awareness, decreased problem solving, decreased safety awareness, decreased memory and delayed processing and decreased sitting balance, decreased standing balance, decreased postural control, hemiplegia and decreased balance strategies.  Prior to hospitalization, patient was modified  independent  with mobility and lived with Alone(Living at [facility] looking into SNF> ALF ) in a Independent living facility([facility]) home.  Home access is   .  Patient will benefit from skilled PT intervention to maximize safe functional mobility, minimize fall risk and decrease caregiver burden for planned discharge home with 24 hour supervision.  Anticipate patient will benefit from follow up [place] at discharge.  PT - End of Session Activity Tolerance: Tolerates 10 - 20 min activity with multiple rests Endurance Deficit: Yes Endurance Deficit Description: pt appears exhausted and used facial expressions to convey this PT Assessment Rehab Potential (ACUTE/IP ONLY): Good PT Patient demonstrates impairments in the following area(s): Balance;Safety;Sensory;Endurance;Motor;Pain PT Transfers Functional Problem(s): Bed Mobility;Bed to Chair;Car;Furniture PT Locomotion Functional Problem(s): Stairs;Wheelchair Mobility;Ambulation PT Plan PT Intensity: Minimum of 1-2 x/day ,45 to 90 minutes PT Frequency: 5 out of 7 days PT Duration Estimated Length of Stay: 21-24 PT Treatment/Interventions: Ambulation/gait training;Community reintegration;DME/adaptive equipment instruction;Neuromuscular re-education;Psychosocial support;Stair training;UE/LE Strength taining/ROM;Balance/vestibular training;Discharge planning;Functional electrical stimulation;Pain management;Therapeutic Activities;UE/LE Coordination activities;Cognitive remediation/compensation;Functional mobility training;Patient/family education;Splinting/orthotics;Therapeutic Exercise;Visual/perceptual remediation/compensation PT Transfers Anticipated Outcome(s): min assist basic and car PT Locomotion Anticipated Outcome(s): supervision w/c x 150' controlled, 50' w/c home, min assist w/c 150' community; gait and stairs TBD PT Recommendation Recommendations for Other Services: Therapeutic Recreation  consult Therapeutic Recreation Interventions: Stress management Follow Up Recommendations: 24 hour supervision/assistance;Home health PT Patient destination: Assisted Living(or SNF) Equipment Recommended: To be determined Equipment Details: owns a rollator  Skilled Therapeutic Intervention Pt's son [name], from [place] present during eval.  Family is looking into SNF> assisted living unit at [facility].  Pt aphasic with nonsensical speech to questions during session, except for several accurate, emphatic Yes/No answers to questions.  Pt dizzy upon sitting up, which resolved in less than 2 minutes.     Squat pivot transfer to L with total assist.  Pt demonstrated adequate motor planning to propel w/c using hemi method, with mod assistance. She tended to push toward affected side with LUE and LLE, but did limit it with cues.   Use of Stedy for further transfers, requiring +2 to come to standing.  Pt automatically attempted to place r hand on bar.    Therapeutic activities in sitting on mat with feet supported, without back support:  reaching within BOS with L hand for cups and crossing midline to hand to [place], PTA sitting to her R.  Pt expressed pain when rotating trunk, so this was kept to a minimum.  L lateral leans onto 2 pillows>< midline, with mod cues and demo, x 5 x 2.   Pt returned to bed via Stedy.  Pt left resting with needs at hand; pt unable to respond to question about how to use call bell.  Son present;  alarm set; staff notified about probable bowel accident.   PT Evaluation Precautions/Restrictions Precautions Precautions: Fall Precaution Comments: watch BP / visual deficits; hx of spinal compression fxs (osteoporosis); L ankle fixed 2/2 ORIF 15 years ago Restrictions Weight Bearing Restrictions: No General   Vital SignsTherapy Vitals Temp: 97.9 F (36.6 C) Temp Source: Oral Pulse Rate: 68 Resp: 16 BP: (!) 178/70 Patient Position (if appropriate): Lying  Oxygen  Therapy SpO2: 100 % O2 Device: Room Air Pain Pain Assessment Pain Scale: Faces Pain Score: 0-No pain Faces Pain Scale: No hurt(at rest; pain occurs with trunk movement) Home Living/Prior Functioning Home Living Living Arrangements: Alone Type of Home: Independent living facility([facility]) Home Layout: One level Bathroom Shower/Tub: Multimedia programmer: Standard Bathroom Accessibility: Yes Additional Comments: [facility]   Lives With: Alone(Living at [facility] looking into SNF> ALF ) Prior Function Level of Independence: Requires assistive device for independence  Able to Take Stairs?: Yes Driving: Yes 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[employer]) Comments: using RW for longer distance ambulation, was ambulating in her apt without AD since a fall in Jan 2019 Vision/Perception  Vision - Assessment Ocular Range of Motion: Impaired-to be further tested in functional context Alignment/Gaze Preference: Head turned(difficulty following gaze to L side) Tracking/Visual Pursuits: Able to track stimulus in all quads without difficulty Perception Perception: Impaired Praxis Praxis: Impaired Praxis Impairment Details: Motor planning Praxis-Other Comments: difficulty with localizing and functional motor planning  Cognition Overall Cognitive Status: Difficult to assess Arousal/Alertness: Awake/alert Orientation Level: Oriented to person(Unable to assess further d/t expressive aphasia) Attention: Sustained Focused Attention: Appears intact(pt was active listener to tasks and requests) Sustained Attention: Impaired Sustained Attention Impairment: Verbal basic;Functional basic Memory: Impaired Awareness: Impaired Problem Solving: Impaired Problem Solving Impairment: Verbal basic Executive Function: Self Monitoring Self Monitoring: Impaired Behaviors: Perseveration Safety/Judgment: Impaired Sensation Sensation Light Touch: Appears  Intact Proprioception: Not tested Coordination Gross Motor Movements are Fluid and Coordinated: No Fine Motor Movements are Fluid and Coordinated: No Coordination and Movement Description: demonstrating impairments to R UE/LE with low tone and generalized hemiplegia. unable to actively move R UE  Motor  Motor Motor: Abnormal tone Motor - Skilled Clinical Observations: flexor withdrawal upon stimulation of plantar surface foot  Mobility Bed Mobility Bed Mobility: Rolling Right;Rolling Left;Right Sidelying to Sit Rolling Right: Maximal Assistance - Patient 25-49% Rolling Left: Maximal Assistance - Patient 25-49% Right Sidelying to Sit: Maximal Assistance - Patient 25-49% Transfers Transfers: Squat Pivot Transfers(also +2 bariatric Stedy) Squat Pivot Transfers: Total Assistance - Patient < 25% Transfer (Assistive device): None Locomotion  Gait Ambulation: No Gait Gait: No Stairs / Additional Locomotion Stairs: No Wheelchair Mobility Wheelchair Mobility: Yes Wheelchair Assistance: Moderate Assistance - Patient 50 - 74% Wheelchair Propulsion: Left upper extremity;Left lower extremity Wheelchair Parts Management: Needs assistance Distance: 50  Trunk/Postural Assessment  Cervical Assessment Cervical Assessment: Exceptions to WFL(laterally flexed to R with ) Thoracic Assessment Thoracic Assessment: Exceptions to WFL(fixed kyphosis; lateral lean to r side with R shoulder girdle droop) Lumbar Assessment Lumbar Assessment: Exceptions to WFL(lateral lean to R ; posterior pelvic tilt) Postural Control Postural Control: Deficits on evaluation(unstable trunk balance without support) Righting Reactions: absent head and trunk; improved with multimodal cues and visual feedback Protective Responses: delayed and inadequate  Balance Balance Balance Assessed: Yes Static Sitting Balance Static Sitting - Level of Assistance: 4: Min assist(pt tends to push with LUE and LLE to R side) Dynamic  Sitting Balance Dynamic Sitting - Level of Assistance: 4: Min assist Sitting balance - Comments: Sat EOB with noted R lean and tendency for LUE to push towards Right side Static Standing Balance Static Standing - Level of Assistance: 2: Max assist Dynamic Standing Balance Dynamic Standing - Level of Assistance: Not tested (comment) Extremity Assessment   RLE Assessment RLE Assessment: Exceptions to [facility] Passive Range of Motion (PROM) Comments: 0 degrees ankle DF General Strength Comments: grossly in sitting: automatic movements noted  of hip flexion and knee extension to place foot on base of Stedy; no active motors on command or demo LLE Assessment LLE Assessment: Exceptions to [facility] Passive Range of Motion (PROM) Comments: 0 degrees ankle DF due to ORIF Active Range of Motion (AROM) Comments: 0 degrees ankle DF due to ORIF General Strength Comments:  difficult to assess due to aphasia; pt sits in L hip adduction and appears to have genu varus (L TKA), fixed ankle including lateal rotation   See Function Navigator for Current Functional Status.   Refer to Care Plan for Long Term Goals  Recommendations for other services: Therapeutic Recreation  Stress management  Discharge Criteria: Patient will be discharged from PT if patient refuses treatment 3 consecutive times without medical reason, if treatment goals not met, if there is a change in medical status, if patient makes no progress towards goals or if patient is discharged from hospital.  The above assessment, treatment plan, treatment alternatives and goals were discussed and mutually agreed upon: by patient and by family  Berthel Bagnall 08/07/2017, 6:02 PM

## 2017-08-07 NOTE — Progress Notes (Signed)
Social Work  Social Work Assessment and Plan  Patient Details  Name: Sara Russell MRN: [id number] Date of Birth: 1930/09/30  Today's Date: 08/07/2017  Problem List:  Patient Active Problem List   Diagnosis Date Noted  . Thalamic hemorrhage ([place]) 08/06/2017  . Hemorrhagic stroke ([place])   . Hyperlipidemia   . PAF (paroxysmal atrial fibrillation) ([place])   . Benign essential HTN   . Stage 3 chronic kidney disease ([place])   . ICH (intracerebral hemorrhage) ([place]) 08/03/2017  . At high risk for falls 02/12/2017  . Insomnia 02/12/2017  . Pulmonary nodules/lesions, multiple 01/29/2017  . Acute pain of right shoulder 01/28/2017  . Hypothyroidism 04/04/2015  . Compression fracture of L1 lumbar vertebra (HCC) 02/23/2015  . Cholelithiases 02/23/2015  . Encounter for palliative care   . Chronic diastolic congestive heart failure ([place])   . Osteoporosis 05/13/2014  . Gout 08/16/2013  . Hyponatremia 08/10/2013  . Left atrial enlargement 01/10/2013  . A-fib ([place]) 12/08/2012  . Rosacea 05/15/2012  . Anemia due to chronic blood loss 08/28/2010  . DEGENERATIVE DISC DISEASE, LUMBAR SPINE 07/14/2009  . GERD 07/11/2009  . PERSONAL HX COLONIC POLYPS 07/11/2009  . HYPERCHOLESTEROLEMIA 04/10/2006  . HYPERTENSION, BENIGN SYSTEMIC 04/10/2006  . History of pulmonary embolism 04/10/2006  . DIVERTICULOSIS OF COLON 04/10/2006  . ACTINIC KERATOSIS 04/10/2006  . GEN OSTEOARTHROSIS INVOLVING MULTIPLE SITES 04/10/2006   Past Medical History:  Past Medical History:  Diagnosis Date  . Abdominal mass   . AKI (acute kidney injury) ([place]) 09/15/2014  . Atrial fibrillation ([place])   . Colitis, ulcerative ([place])   . Cough    WHITE SPUTUM FOR 2 WEEKS, NO FEVER  . Degenerative joint disease   . Diverticulosis   . DVT (deep venous thrombosis) ([place]) 2009   BOTH LUNGS  . GERD (gastroesophageal reflux disease)   . Hyperlipidemia   . Hyperplastic colon polyp   . Hypertension   . Hypothyroidism   . Iron  deficiency anemia    ON IRON AT TIMES  . PE (pulmonary embolism) 2009  . Persistent atrial fibrillation ([place])   . Positive H. pylori test 01/20/96  . S/P dilatation of esophageal stricture 1992  . Skin cancer    h/o basal and squamous skin cancer--> removed   Past Surgical History:  Past Surgical History:  Procedure Laterality Date  . APPENDECTOMY    . BREAST EXCISIONAL BIOPSY Left 1992   benign  . BREAST LUMPECTOMY     benign  . DILATION AND CURETTAGE OF UTERUS     for endometrial polyps  . ENDOSCOPIC RETROGRADE CHOLANGIOPANCREATOGRAPHY (ERCP) WITH PROPOFOL N/A 03/23/2015   Procedure: ENDOSCOPIC RETROGRADE CHOLANGIOPANCREATOGRAPHY (ERCP) WITH PROPOFOL;  Surgeon: Milus Banister, MD;  Location: [facility];  Service: Endoscopy;  Laterality: N/A;  . JOINT REPLACEMENT    . ORIF ANKLE FRACTURE Left    X 2  . REPLACEMENT TOTAL KNEE     left  . SURGERY DONE TO REMOVE LEFT ANKLE HARDWARE LATER    . TONSILLECTOMY     Social History:  reports that she quit smoking about 56 years ago. Her smoking use included cigarettes. She started smoking about 69 years ago. She has a 6.50 pack-year smoking history. She has never used smokeless tobacco. She reports that she drinks about 3.0 oz of alcohol per week. She reports that she does not use drugs.  Family / Support Systems Marital Status: Widow/Widower How Long?: 8 years Patient Roles: Parent, Volunteer Children: Sara Russell-POA-son [phone]  [phone]  [phone]  [phone] Other Supports: Sara Russell [phone] Anticipated Caregiver: [facility] SNF Ability/Limitations of Caregiver: No limitations lives at [facility] and has all levels of care Caregiver Availability: 24/7 Family Dynamics: Close knit family pt has always been very independent and active throughout her life. She wants to recover as much as possible before going back to [facility]. Pt has family, friends and church members who are supportive.  Social  History Preferred language: English Religion: Catholic Cultural Background: No issues Education: RN-Nursing school Read: Yes Write: Yes Employment Status: Retired Freight forwarder Issues: No issues Guardian/Conservator: Sara Russell-Son-POA-according to MD pt is not fully capable at this time will look toward her son-Sara Russell who is POA for any decisions until able to make on her own again. He will also include his brothers.   Abuse/Neglect Abuse/Neglect Assessment Can Be Completed: Yes Physical Abuse: Denies Verbal Abuse: Denies Sexual Abuse: Denies Exploitation of patient/patient's resources: Denies Self-Neglect: Denies  Emotional Status Pt's affect, behavior adn adjustment status: Pt is motivated to do well here. She really wants to be able to communicate since she liked talking and being active prior to this. Her son and daughter in-law confirm this and voice she was very active and involved at [employer] Recent Psychosocial Issues: Has health issues but were managed by PCP and she was independent Pyschiatric History: No issues deferred depression screen due to pt's speech deficits, do feel she would benefit from seeing neuro-psych while here for coping. Will ask input from team. Substance Abuse History: No issues  Patient / Family Perceptions, Expectations & Goals Pt/Family understanding of illness & functional limitations: Pt and family can explain her stroke and deficits. Son's speak with the MD's and feel they have a good understanding of her condition and the treatment plan going forward. Son's plan to be here daily to observe Mom in therapies and talk with the treatment team. Premorbid pt/family roles/activities: Mom, grandmother, retiree, church member, friend, etc Anticipated changes in roles/activities/participation: resume Pt/family expectations/goals: Pt states: " I know."  [name] states: " I hope she can do well and regain as much as she can here, then we will go from here  about what level she needs."   [employer]: Other (Comment)(Resident of Sara Russell-ILF) Premorbid Home Care/DME Agencies: None Transportation available at discharge: family Resource referrals recommended: Neuropsychology, Support group (specify)  Discharge Planning Living Arrangements: Alone Support Systems: Children, Water engineer, Social worker community Type of Residence: Other (Comment)(Independent Living at [employer]) Insurance Resources: [employer], Multimedia programmer (specify)(BCBS) Financial Screen Referred: No Living Expenses: Other (Comment)(Independent living apt at [employer]) Money Management: Patient Does the patient have any problems obtaining your medications?: No Home Management: Facility-cleaners and went to the dining room for meals Patient/Family Preliminary Plans: Plan probably to go to [facility] rehab-SNF once discharges from rehab, to continue to get more therpaies and care. Then from thee to Assisted living or back to her apartment if possible. Depends upon her progress here. Social Work Anticipated Follow Up Needs: SNF, Support Group  Clinical Impression Pleasant female who is willing to work hard to recover as much as she can of her independence while here. She hopes to go back to her apartment but has the option to go to Assisted Living or SNF level of care at [facility]. Three son's very involved and willing to do what they can to assist, plan to be here daily yo observe in therapies and provide support. Will make neuro-psych referral when appropriate and work on discharge needs.  Elease Hashimoto 08/07/2017, 2:34 PM

## 2017-08-07 NOTE — Care Management Note (Signed)
Inpatient Rehabilitation Center Individual Statement of Services  Patient Name:  Sara Russell  Date:  08/07/2017  Welcome to the [facility].  Our goal is to provide you with an individualized program based on your diagnosis and situation, designed to meet your specific needs.  With this comprehensive rehabilitation program, you will be expected to participate in at least 3 hours of rehabilitation therapies Monday-Friday, with modified therapy programming on the weekends.  Your rehabilitation program will include the following services:  Physical Therapy (PT), Occupational Therapy (OT), Speech Therapy (ST), 24 hour per day rehabilitation nursing, Therapeutic Recreaction (TR), Neuropsychology, Case Management (Social Worker), Rehabilitation Medicine, Nutrition Services and Pharmacy Services  Weekly team conferences will be held on Wednesday to discuss your progress.  Your Social Worker will talk with you frequently to get your input and to update you on team discussions.  Team conferences with you and your family in attendance may also be held.  Expected length of stay: 21-24 days  Overall anticipated outcome: min assist level  Depending on your progress and recovery, your program may change. Your Social Worker will coordinate services and will keep you informed of any changes. Your Social Worker's name and contact numbers are listed  below.  The following services may also be recommended but are not provided by the [facility]:    [name] will be made to provide these services after discharge if needed.  Arrangements include referral to agencies that provide these services.  Your insurance has been verified to be:  [place] Your primary doctor is:  Madison Hickman  Pertinent information will be shared with your doctor and your insurance company.  Social  Worker:  Ovidio Kin, [place] or [phone]  Information discussed with and copy given to patient by: Elease Hashimoto, 08/07/2017, 10:25 AM

## 2017-08-07 NOTE — Progress Notes (Signed)
Initial Nutrition Assessment  DOCUMENTATION CODES:   Not applicable  INTERVENTION:  Provide Ensure Enlive po BID, each supplement provides 350 kcal and 20 grams of protein.  Encourage adequate PO intake.   NUTRITION DIAGNOSIS:   Increased nutrient needs related to chronic illness(CHF) as evidenced by estimated needs.  GOAL:   Patient will meet greater than or equal to 90% of their needs  MONITOR:   PO intake, Supplement acceptance, Labs, Weight trends, I & O's, Skin  REASON FOR ASSESSMENT:   Malnutrition Screening Tool    ASSESSMENT:    84 year old right-handed female with history of mild renal insufficiency with creatinine 9.17, chronic diastolic congestive heart failure, atrial fibrillation maintained on amiodarone as well as Eliquis, DVT/PE, hypertension. Presented 08/02/2017 with right sided weakness, facial droop and aphasia.  Blood pressure 221/89.  MRI reviewed showed left thalamic hemorrhage.   Meal completion has been 10-50%. Family at bedside. They report pt usually consumes 3 meals a day with an Ensure at least once daily. Meal completion unpredictable. Family has been encouraging PO intake at meals. Weight has been stable. RD to order nutritional supplements to aid in caloric and protein needs.   Unable to complete Nutrition-Focused physical exam at this time. RD to perform physical exam at next visit.   Diet Order:   Diet Order           Diet regular Room service appropriate? Yes; Fluid consistency: Thin  Diet effective now          EDUCATION NEEDS:   Not appropriate for education at this time  Skin:  Skin Assessment: Reviewed RN Assessment  Last BM:  6/27  Height:   Ht Readings from Last 1 Encounters:  08/02/17 5' 4"  (1.626 m)    Weight:   Wt Readings from Last 1 Encounters:  08/02/17 154 lb 8.7 oz (70.1 kg)    Ideal Body Weight:  54.5 kg  BMI:  There is no height or weight on file to calculate BMI.  Estimated Nutritional Needs:    Kcal:  1650-1800  Protein:  70-80 grams  Fluid:  Per MD    Corrin Parker, MS, RD, LDN Pager # [phone] After hours/ weekend pager # [phone]

## 2017-08-07 NOTE — Evaluation (Signed)
Speech Language Pathology Assessment and Plan  Patient Details  Name: Sara Russell MRN: [id number] Date of Birth: 07-Feb-1931  SLP Diagnosis: Aphasia;Cognitive Impairments  Rehab Potential: Good ELOS: 2 to 2.5 weeks    Today's Date: 08/07/2017 SLP Individual Time: 4163-8453 SLP Individual Time Calculation (min): 60 min   Problem List:  Patient Active Problem List   Diagnosis Date Noted  . Thalamic hemorrhage ([place]) 08/06/2017  . Hemorrhagic stroke ([place])   . Hyperlipidemia   . PAF (paroxysmal atrial fibrillation) ([place])   . Benign essential HTN   . Stage 3 chronic kidney disease ([place])   . ICH (intracerebral hemorrhage) ([place]) 08/03/2017  . At high risk for falls 02/12/2017  . Insomnia 02/12/2017  . Pulmonary nodules/lesions, multiple 01/29/2017  . Acute pain of right shoulder 01/28/2017  . Hypothyroidism 04/04/2015  . Compression fracture of L1 lumbar vertebra (HCC) 02/23/2015  . Cholelithiases 02/23/2015  . Encounter for palliative care   . Chronic diastolic congestive heart failure ([place])   . Osteoporosis 05/13/2014  . Gout 08/16/2013  . Hyponatremia 08/10/2013  . Left atrial enlargement 01/10/2013  . A-fib ([place]) 12/08/2012  . Rosacea 05/15/2012  . Anemia due to chronic blood loss 08/28/2010  . DEGENERATIVE DISC DISEASE, LUMBAR SPINE 07/14/2009  . GERD 07/11/2009  . PERSONAL HX COLONIC POLYPS 07/11/2009  . HYPERCHOLESTEROLEMIA 04/10/2006  . HYPERTENSION, BENIGN SYSTEMIC 04/10/2006  . History of pulmonary embolism 04/10/2006  . DIVERTICULOSIS OF COLON 04/10/2006  . ACTINIC KERATOSIS 04/10/2006  . GEN OSTEOARTHROSIS INVOLVING MULTIPLE SITES 04/10/2006   Past Medical History:  Past Medical History:  Diagnosis Date  . Abdominal mass   . AKI (acute kidney injury) ([place]) 09/15/2014  . Atrial fibrillation ([place])   . Colitis, ulcerative ([place])   . Cough    WHITE SPUTUM FOR 2 WEEKS, NO FEVER  . Degenerative joint disease   . Diverticulosis   . DVT (deep venous  thrombosis) ([place]) 2009   BOTH LUNGS  . GERD (gastroesophageal reflux disease)   . Hyperlipidemia   . Hyperplastic colon polyp   . Hypertension   . Hypothyroidism   . Iron deficiency anemia    ON IRON AT TIMES  . PE (pulmonary embolism) 2009  . Persistent atrial fibrillation ([place])   . Positive H. pylori test 01/20/96  . S/P dilatation of esophageal stricture 1992  . Skin cancer    h/o basal and squamous skin cancer--> removed   Past Surgical History:  Past Surgical History:  Procedure Laterality Date  . APPENDECTOMY    . BREAST EXCISIONAL BIOPSY Left 1992   benign  . BREAST LUMPECTOMY     benign  . DILATION AND CURETTAGE OF UTERUS     for endometrial polyps  . ENDOSCOPIC RETROGRADE CHOLANGIOPANCREATOGRAPHY (ERCP) WITH PROPOFOL N/A 03/23/2015   Procedure: ENDOSCOPIC RETROGRADE CHOLANGIOPANCREATOGRAPHY (ERCP) WITH PROPOFOL;  Surgeon: Milus Banister, MD;  Location: [facility];  Service: Endoscopy;  Laterality: N/A;  . JOINT REPLACEMENT    . ORIF ANKLE FRACTURE Left    X 2  . REPLACEMENT TOTAL KNEE     left  . SURGERY DONE TO REMOVE LEFT ANKLE HARDWARE LATER    . TONSILLECTOMY      Assessment / Plan / Recommendation Clinical Impression Sara Russell is an 84 year old right-handed female with history of mild renal insufficiency with creatinine 6.46, chronic diastolic congestive heart failure, atrial fibrillation maintained on amiodarone as well as Eliquis, DVT/PE, hypertension. Patient did receive inpatient rehab services August 2016 for debilitation related to congestive  heart failure. Per chart review and daughter-in-law. Patient lives alone at [facility] facility for the past 3 years. Presented 08/02/2017 with right sided weakness, facial droop and aphasia. Blood pressure 221/89. MRI reviewed showed left thalamic hemorrhage. Per report, acute left thalamic intraparenchymal hemorrhage measuring 1.9 x 2.0 x 2.5 cm. Mildly increased localized edema with  trace left to right midline shift. No intraventricular extension or hydrocephalus. 2 subcentimeter subacute ischemic cortical infarct involving the left frontal and parietal occipital regions. Echocardiogram with ejection fraction of 65% no wall motion abnormalities grade 2 diastolic dysfunction. Initially maintained on Cleviprex for blood pressure control. Eliquis held secondary to intraparenchymal hemorrhage. Tolerating a regular diet. Physical occupational therapy evaluations completed with recommendations of physical medicine rehab consult. Patient was admitted for a comprehensive rehab program on 08/06/17.   Speech-language evaluation completed on 09/06/17. Pt presents with moderate expressive and receptive aphasia. Expressive speech is c/b by fluent jargon, semantic and phonemic paraphasias and perseveration of words. Receptively pt is able to follow simple 1 step directions related to herself, answer yes/no questions related to self as well as sustain attention to task. Pt is able to read at the sentence level with fluency which was helpful during naming tasks but uncertain at this time if pt comprehends written language. Cognition is difficult to assess at this time d/t aphasia but pt does demonstrate decreased attention to right and decreased awareness of deficits during functional tasks. Skilled ST is required to target the above mentioned deficits to increased functional communication and independent as well as reduce caregiver burden. Anticipate that pt will require 24 supervision and perhaps skill level of care at discharge iwth follow-up ST services at next venue of care.    Skilled Therapeutic Interventions          Skilled treatment session focused on completion of speech-language evaluation, see above. Pt was able to name common object in 5 of 10 opportunities with sentence completion and phonemic cues helpful for remaining objects. Pt with perseveration of words and spontaneous jargon. Pt  required Max A cues to follow 2 step directions, answer basic yes/no questions and to scan to right of midline.  Extensive education provided to pt's sons on expressive/receptive aphasia, therapy strategies and POC of care.    SLP Assessment  Patient will need skilled [facility] Services during CIR admission    Recommendations  Patient destination: [facility] (SNF) Follow up Recommendations: Skilled Nursing facility Equipment Recommended: None recommended by SLP    SLP Frequency 3 to 5 out of 7 days   SLP Duration  SLP Intensity  SLP Treatment/Interventions 2 to 2.5 weeks  Minumum of 1-2 x/day, 30 to 90 minutes  Cueing hierarchy;Speech/Language facilitation;Therapeutic Activities;Patient/family education;Functional tasks    Pain Pain Assessment Pain Score: 0-No pain  Prior Functioning Cognitive/Linguistic Baseline: Within functional limits Type of Home: Independent living facility([facility])  Lives With: Alone(Living at [facility] looking into SNF> ALF ) 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  Social Interaction Social Interaction assist level: Interacts appropriately less than 25% of the time. May be withdrawn or combative.  Problem Solving Problem solving assist level:  Solves basic less than 25% of the time - needs direction nearly all the time or does not effectively solve problems and may need a restraint for safety;Solves basic 25 - 49% of the time - needs direction more than half the time to initiate, plan or complete simple activities  Memory Memory assist level: Recognizes  or recalls 25 - 49% of the time/requires cueing 50 - 75% of the time;Recognizes or recalls less than 25% of the time/requires cueing greater than 75% of the time   Short Term Goals: Week 1: SLP Short Term Goal 1 (Week 1): Pt will name objects in 9 of 10 opportunities with Mod A cues.  SLP Short Term Goal 2 (Week 1): Pt will select requested object in field of 2 in 9 of 10 opportunities with Min A cues.  SLP Short Term Goal 3 (Week 1): Pt will demonstrate selective attention in mildly distracting environment for ~ 15 minutes with Mod A cues.  SLP Short Term Goal 4 (Week 1): Pt will complete basic familiar tasks with Min A cues.  SLP Short Term Goal 5 (Week 1): Pt will scan to right of midline to locate objects in 8 of 10 opportunities with Mod A cues.   Refer to Care Plan for Long Term Goals  Recommendations for other services: None   Discharge Criteria: Patient will be discharged from SLP if patient refuses treatment 3 consecutive times without medical reason, if treatment goals not met, if there is a change in medical status, if patient makes no progress towards goals or if patient is discharged from hospital.  The above assessment, treatment plan, treatment alternatives and goals were discussed and mutually agreed upon: by patient and by family  Kyley Solow 08/07/2017, 2:21 PM

## 2017-08-07 NOTE — Evaluation (Signed)
Occupational Therapy Assessment and Plan  Patient Details  Name: Sara Russell MRN: [id number] Date of Birth: 1930/09/27  OT Diagnosis: abnormal posture, altered mental status, cognitive deficits, hemiplegia affecting dominant side and muscle weakness (generalized) Rehab Potential: Rehab Potential (ACUTE ONLY): Good ELOS: 21-24 days    Today's Date: 08/07/2017 OT Individual Time: 1100-1200 OT Individual Time Calculation (min): 60 min     Problem List:  Patient Active Problem List   Diagnosis Date Noted  . Thalamic hemorrhage ([place]) 08/06/2017  . Hemorrhagic stroke ([place])   . Hyperlipidemia   . PAF (paroxysmal atrial fibrillation) ([place])   . Benign essential HTN   . Stage 3 chronic kidney disease ([place])   . ICH (intracerebral hemorrhage) ([place]) 08/03/2017  . At high risk for falls 02/12/2017  . Insomnia 02/12/2017  . Pulmonary nodules/lesions, multiple 01/29/2017  . Acute pain of right shoulder 01/28/2017  . Hypothyroidism 04/04/2015  . Compression fracture of L1 lumbar vertebra ([facility]) 02/23/2015  . Cholelithiases 02/23/2015  . Encounter for palliative care   . Chronic diastolic congestive heart failure ([place])   . Osteoporosis 05/13/2014  . Gout 08/16/2013  . Hyponatremia 08/10/2013  . Left atrial enlargement 01/10/2013  . A-fib ([place]) 12/08/2012  . Rosacea 05/15/2012  . Anemia due to chronic blood loss 08/28/2010  . DEGENERATIVE DISC DISEASE, LUMBAR SPINE 07/14/2009  . GERD 07/11/2009  . PERSONAL HX COLONIC POLYPS 07/11/2009  . HYPERCHOLESTEROLEMIA 04/10/2006  . HYPERTENSION, BENIGN SYSTEMIC 04/10/2006  . History of pulmonary embolism 04/10/2006  . DIVERTICULOSIS OF COLON 04/10/2006  . ACTINIC KERATOSIS 04/10/2006  . GEN OSTEOARTHROSIS INVOLVING MULTIPLE SITES 04/10/2006    Past Medical History:  Past Medical History:  Diagnosis Date  . Abdominal mass   . AKI (acute kidney injury) ([place]) 09/15/2014  . Atrial fibrillation ([place])   . Colitis, ulcerative ([place])   . Cough     WHITE SPUTUM FOR 2 WEEKS, NO FEVER  . Degenerative joint disease   . Diverticulosis   . DVT (deep venous thrombosis) ([place]) 2009   BOTH LUNGS  . GERD (gastroesophageal reflux disease)   . Hyperlipidemia   . Hyperplastic colon polyp   . Hypertension   . Hypothyroidism   . Iron deficiency anemia    ON IRON AT TIMES  . PE (pulmonary embolism) 2009  . Persistent atrial fibrillation ([place])   . Positive H. pylori test 01/20/96  . S/P dilatation of esophageal stricture 1992  . Skin cancer    h/o basal and squamous skin cancer--> removed   Past Surgical History:  Past Surgical History:  Procedure Laterality Date  . APPENDECTOMY    . BREAST EXCISIONAL BIOPSY Left 1992   benign  . BREAST LUMPECTOMY     benign  . DILATION AND CURETTAGE OF UTERUS     for endometrial polyps  . ENDOSCOPIC RETROGRADE CHOLANGIOPANCREATOGRAPHY (ERCP) WITH PROPOFOL N/A 03/23/2015   Procedure: ENDOSCOPIC RETROGRADE CHOLANGIOPANCREATOGRAPHY (ERCP) WITH PROPOFOL;  Surgeon: Milus Banister, MD;  Location: [facility];  Service: Endoscopy;  Laterality: N/A;  . JOINT REPLACEMENT    . ORIF ANKLE FRACTURE Left    X 2  . REPLACEMENT TOTAL KNEE     left  . SURGERY DONE TO REMOVE LEFT ANKLE HARDWARE LATER    . TONSILLECTOMY      Assessment & Plan Clinical Impression: Patient is a 84 year old right-handed female with history of mild renal insufficiency with creatinine 2.39, chronic diastolic congestive heart failure, atrial fibrillation maintained on amiodarone as well as Eliquis, DVT/PE, hypertension.  Patient did receive inpatient rehab services August 2016 for debilitation related to congestive heart failure.  Per chart review and daughter-in-law.  Patient lives alone at [facility] independent living facility for the past 3 years.  Presented 08/02/2017 with right sided weakness, facial droop and aphasia.  Blood pressure 221/89.  MRI reviewed showed left thalamic hemorrhage.   .  Patient transferred to CIR on  08/06/2017 .    Patient currently requires max with basic self-care skills secondary to muscle weakness, decreased coordination and decreased motor planning, decreased midline orientation, decreased attention to right and decreased motor planning, decreased initiation, decreased attention, decreased awareness, decreased problem solving, decreased safety awareness, decreased memory and delayed processing, peripheral and decreased sitting balance, decreased standing balance, decreased postural control, hemiplegia and decreased balance strategies.  Prior to hospitalization, patient could complete ADLs with modified independent .  Patient will benefit from skilled intervention to decrease level of assist with basic self-care skills prior to discharge TBD either ILF, ALF, or SNF.  Anticipate patient will require 24 hour supervision and follow up home health.  OT - End of Session Activity Tolerance: Tolerates 30+ min activity with multiple rests Endurance Deficit: Yes OT Assessment Rehab Potential (ACUTE ONLY): Good OT Barriers to Discharge: Inaccessible home environment;Decreased caregiver support;Lack of/limited family support OT Patient demonstrates impairments in the following area(s): Balance;Motor;Sensory;Behavior;Nutrition;Skin Integrity;Vision;Cognition;Perception;Safety;Endurance OT Basic ADL's Functional Problem(s): Eating;Grooming;Bathing;Dressing;Toileting OT Transfers Functional Problem(s): Toilet;Tub/Shower OT Plan OT Intensity: Minimum of 1-2 x/day, 45 to 90 minutes OT Frequency: 5 out of 7 days OT Duration/Estimated Length of Stay: 21-24 days  OT Treatment/Interventions: Balance/vestibular training;Discharge planning;Community reintegration;Cognitive remediation/compensation;DME/adaptive equipment instruction;Functional mobility training;Therapeutic Activities;UE/LE Strength taining/ROM;Visual/perceptual remediation/compensation;UE/LE Coordination activities;Therapeutic Exercise;Self  Care/advanced ADL retraining;Patient/family education;Neuromuscular re-education;Functional electrical stimulation;Splinting/orthotics OT Self Feeding Anticipated Outcome(s): S OT Basic Self-Care Anticipated Outcome(s): min A  OT Toileting Anticipated Outcome(s): min A OT Bathroom Transfers Anticipated Outcome(s): min A  OT Recommendation Patient destination: [place] [facility] (SNF)(SNF or SLF- TBD and discussed with family ) Follow Up Recommendations: 24 hour supervision/assistance Equipment Recommended: 3 in 1 bedside comode;Tub/shower bench;To be determined   Skilled Therapeutic Intervention Chart reviewed and pt evaluation completed upon arrival. Pts 3 sons were present at time of eval and provided information on pts history. Pt was resting in bed with HOB elevated. She was lethargic and took several v and t cues to wake her up. Therapist performed AROM/AAROM to joints in R U/LE and L U/E for baseline level. Pt able to follow and initiate simple instructions but was mostly disoriented. Due to expressive aphasia pt not able to respond to questions. Therapist provided light joint compression to R UE for muscle activation. Pt required max A using grab bars to move from supine lying to EOB sitting. Pt tolerated sitting upright for 5 minutes with mod-max support to upper body. demo'ed lateral lean to the R with laterally flexed head to R and difficulty keeping posture. At this time, pt required max A to lay down into bed using side lying technique. Throughout session therapist provided verbal and tactile reminders to patient for R UE/LE awareness d/t inattention. D/b was completed in bed and required max A for all tasks. Pt completed rolling tasks with verbal instructions with mod A using grab bars for perineal hygiene/care. At this time pt repositioned in bed with max A and set up for lunch. CNA notified for assistance with feeding. Call bell within reach and bed alarm on. POC discussed with pt  and son present. No c/o of pain at this  time.   OT Evaluation Precautions/Restrictions  Precautions Precautions: Fall Precaution Comments: watch BP / visual deficits Restrictions Weight Bearing Restrictions: No General Chart Reviewed: Yes Additional Pertinent History: history of mild renal insufficiency with creatinine 2.01, chronic diastolic congestive heart failure, atrial fibrillation maintained on amiodarone as well as Eliquis, DVT/PE, hypertension.  Patient did receive inpatient rehab services August 2016 for debilitation related to congestive heart failure.  Per chart review and daughter-in-law.  Patient lives alone at [facility] independent living facility for the past 3 years.  Presented 08/02/2017 with right sided weakness, facial droop and aphasia.  Blood pressure 221/89.  MRI reviewed showed left thalamic hemorrhage. Response to Previous Treatment: Patient reporting fatigue but able to participate Family/Caregiver Present: Yes Vital Signs Therapy Vitals Temp: 97.9 F (36.6 C) Temp Source: Oral Pulse Rate: 72 Resp: 16 BP: (!) 179/69 Patient Position (if appropriate): Lying Oxygen Therapy SpO2: 100 % O2 Device: Room Air Pain Pain Assessment Pain Scale: Faces Pain Score: 0-No pain Faces Pain Scale: No hurt Home Living/Prior Functioning Home Living Family/patient expects to be discharged to:: Skilled nursing facility Living Arrangements: Alone Type of Home: Independent living facility Home Layout: One level Bathroom Shower/Tub: Multimedia programmer: Standard Bathroom Accessibility: Yes Additional Comments: [facility] IL[facility]   Lives With: Alone(Living at [facility] looking into SNF> ALF) IADL History Homemaking Responsibilities: No Occupation: Retired Prior Function Level of Independence: Requires assistive device for independence, Independent with basic ADLs  Able to [place]?: Yes Driving: Yes 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 ADLs on eval limited d/t pts ability and level of function Vision Baseline Vision/History: No visual deficits Vision Assessment?: Vision impaired- to be further tested in functional context Ocular Range of Motion: Impaired-to be further tested in functional context Alignment/Gaze Preference: Head turned(difficulty following gaze to L side) Tracking/Visual Pursuits: Left eye does not track laterally;Right eye does not track medially Perception  Perception: Impaired Praxis Praxis: Impaired Praxis Impairment Details: Motor planning Praxis-Other Comments: difficulty with localizing and functional motor planning Cognition Overall Cognitive Status: Difficult to assess(expressive aphasia limited ability for output ) Arousal/Alertness: Awake/alert Memory: Impaired Attention: Selective;Alternating Focused Attention: Appears intact(pt was active listener to tasks and requests) Sustained Attention: Impaired Sustained Attention Impairment: Verbal basic;Functional basic Awareness: Impaired Problem Solving: Impaired Problem Solving Impairment: Verbal basic Executive Function: Self Monitoring Self Monitoring: Impaired Behaviors: Perseveration Safety/Judgment: Impaired Sensation Sensation Light Touch: Appears Intact Coordination Gross Motor Movements are Fluid and Coordinated: No Fine Motor Movements are Fluid and Coordinated: No Coordination and Movement Description: demonstrating impairments to R UE/LE with low tone and  generalized hemiplegia. unable to actively move R UE  Motor  Motor Motor: Abnormal tone Motor - Skilled Clinical Observations: hypotonia; hemiplegia to R UE/LE affected  Mobility  Bed Mobility Bed Mobility: Rolling Right;Rolling Left;Right Sidelying to Sit Rolling Right: Maximal Assistance - Patient 25-49% Rolling Left: Maximal Assistance - Patient 25-49% Right Sidelying to Sit: Maximal Assistance - Patient 25-49%  Trunk/Postural Assessment  Cervical Assessment Cervical Assessment: Exceptions to WFL(laterally flexed to R with ) Thoracic Assessment Thoracic Assessment: Exceptions to WFL(lateral  lean to r side with R shoulder girdle droop) Lumbar Assessment Lumbar Assessment: Exceptions to WFL(lateral lean to R ) Postural Control Postural Control: Deficits on evaluation(unstable trunk balance without support)  Balance Balance Balance Assessed: Yes Dynamic Sitting Balance Sitting balance - Comments: Sat EOB with noted R lean and tendency for LUE to push towards Right side Extremity/Trunk Assessment RUE Assessment RUE Assessment: Within Functional Limits LUE Assessment LUE Assessment: Exceptions to [facility] Passive Range of Motion (PROM) Comments: tolerated PROM to all joints in end range Active Range of Motion (AROM) Comments: unable to actively move in any plane at this time  General Strength Comments: impaired, overall weakness  LUE Body System: Neuro   See Function Navigator for Current Functional Status.   Refer to Care Plan for Long Term Goals  Recommendations for other services: None    Discharge Criteria: Patient will be discharged from OT if patient refuses treatment 3 consecutive times without medical reason, if treatment goals not met, if there is a change in medical status, if patient makes no progress towards goals or if patient is discharged from hospital.  The above assessment, treatment plan, treatment alternatives and goals were discussed and mutually agreed upon:  by patient  Delon [place] 08/07/2017, 3:52 PM

## 2017-08-07 NOTE — Progress Notes (Signed)
[facility] PHYSICAL MEDICINE & REHABILITATION     PROGRESS NOTE    Subjective/Complaints: Pt states she had a good night. Denies any pain. Slept well. Ready for therapies today. Ate breakfast.   ROS: Limited due to language somewhat  Objective:  No results found. Recent Labs    08/07/17 0547  WBC 9.7  HGB 11.2*  HCT 33.1*  PLT 188   Recent Labs    08/07/17 0547  NA 134*  K 2.8*  CL 105  GLUCOSE 104*  BUN 8  CREATININE 0.92  CALCIUM 7.7*   CBG (last 3)  No results for input(s): GLUCAP in the last 72 hours.  Wt Readings from Last 3 Encounters:  08/02/17 70.1 kg (154 lb 8.7 oz)  05/05/17 70.3 kg (155 lb)  04/08/17 70.3 kg (155 lb)     Intake/Output Summary (Last 24 hours) at 08/07/2017 0940 Last data filed at 08/07/2017 0745 Gross per 24 hour  Intake 200 ml  Output 291 ml  Net -91 ml    Vital Signs: Blood pressure (!) 136/54, pulse 70, temperature 99.1 F (37.3 C), temperature source Oral, resp. rate 17, SpO2 98 %. Physical Exam:  Constitutional: No distress . Vital signs reviewed. HEENT: EOMI, oral membranes moist Neck: supple Cardiovascular: IRR IRR with gallup, No JVD    Respiratory: CTA Bilaterally without wheezes or rales. Normal effort    GI: BS +, non-tender, non-distended   Musculoskeletal:  No edema or tenderness in extremities  Neurological: She is alert and oriented x0.  Expressive aphasia/apraxia. Able to communicate thoughts with extra time Right central 7 Dysarthria Motor: RUE 4-/5 proximal to distal RLE: 3+ to 4-/5 HF, 4/5 distally LUE and LLE 4 to 4+/5.  Motor apraxia, sl sensory difference right arm and leg with pain provoc Skin: Skin is warm and dry.  Psychiatric: pleasant and cooperative   Assessment/Plan: 1. Functional deficits secondary to left thalamic/internal capsule hemorrhage which require 3+ hours per day of interdisciplinary therapy in a comprehensive inpatient rehab setting. Physiatrist is providing close team  supervision and 24 hour management of active medical problems listed below. Physiatrist and rehab team continue to assess barriers to discharge/monitor patient progress toward functional and medical goals.  Function:  Bathing Bathing position      Bathing parts      Bathing assist        Upper Body Dressing/Undressing Upper body dressing                    Upper body assist        Lower Body Dressing/Undressing Lower body dressing                                  Lower body assist        Toileting Toileting          Toileting assist     Transfers Chair/bed transfer             Locomotion Ambulation           Wheelchair          Cognition Comprehension Comprehension assist level: Understands basic 25 - 49% of the time/ requires cueing 50 - 75% of the time  Expression Expression assist level: Expresses basis less than 25% of the time/requires cueing >75% of the time.  Social Interaction Social Interaction assist level: Interacts appropriately less than 25% of the time. May be  withdrawn or combative.  Problem Solving Problem solving assist level: Solves basic less than 25% of the time - needs direction nearly all the time or does not effectively solve problems and may need a restraint for safety  Memory Memory assist level: Recognizes or recalls 25 - 49% of the time/requires cueing 50 - 75% of the time   Medical Problem List and Plan: 1.  Right side weakness with aphasia secondary to left thalamic/internal capsule hemorrhage, subcortical infarcts left frontal and parietal-occipital lobes felt to be secondary to uncontrolled HTN, use of anticoagulation  -beginning therapies today  -met and spoke with son today regarding my findings and prognosis 2.  DVT Prophylaxis/Anticoagulation: SCDs. 3. Pain Management: Tylenol as needed 4. Mood: Provide emotional support 5. Neuropsych: This patient is not capable of making decisions on her own  behalf. 6. Skin/Wound Care: Routine skin checks 7. Fluids/Electrolytes/Nutrition: encourage PO intake  -hypokalemia: 2.8-- supplement today and re-check tomorrow 8.  Atrial fibrillation.  Amiodarone 100 mg daily.  Cardiac rate controlled.  Eliquis on hold due to  hemorrhage 9.  Hypertension.  Coreg 12.5 mg twice daily.  Monitor with increased mobility  -good control at present 10.  Chronic diastolic congestive heart failure.  Monitor for signs of fluid overload 11.  Hypothyroidism.  Synthroid 12.  Renal insufficiency.  Baseline creatinine 1.39.----0.92 6/27 13.  Hyperlipidemia.  Lipitor   LOS (Days) 1 A FACE TO FACE EVALUATION WAS PERFORMED  Meredith Staggers, MD 08/07/2017 9:40 AM

## 2017-08-08 ENCOUNTER — Inpatient Hospital Stay (HOSPITAL_COMMUNITY): Payer: No Typology Code available for payment source | Admitting: Speech Pathology

## 2017-08-08 ENCOUNTER — Inpatient Hospital Stay (HOSPITAL_COMMUNITY): Payer: Medicare Other | Admitting: Occupational Therapy

## 2017-08-08 ENCOUNTER — Inpatient Hospital Stay (HOSPITAL_COMMUNITY): Payer: Medicare Other | Admitting: Physical Therapy

## 2017-08-08 LAB — BASIC METABOLIC PANEL
Anion gap: 9 (ref 5–15)
BUN: 14 mg/dL (ref 8–23)
CO2: 22 mmol/L (ref 22–32)
Calcium: 8.1 mg/dL — ABNORMAL LOW (ref 8.9–10.3)
Chloride: 103 mmol/L (ref 98–111)
Creatinine, Ser: 1.04 mg/dL — ABNORMAL HIGH (ref 0.44–1.00)
GFR calc Af Amer: 56 mL/min — ABNORMAL LOW (ref >60)
GFR calc non Af Amer: 48 mL/min — ABNORMAL LOW (ref >60)
Glucose, Bld: 108 mg/dL — ABNORMAL HIGH (ref 70–99)
Potassium: 3.8 mmol/L (ref 3.5–5.1)
Sodium: 134 mmol/L — ABNORMAL LOW (ref 135–145)

## 2017-08-08 MED ORDER — CARVEDILOL 6.25 MG PO TABS
18.0000 mg | ORAL_TABLET | Freq: Two times a day (BID) | ORAL | Status: DC
  Administered 2017-08-08 – 2017-08-27 (×35): 18.75 mg via ORAL
  Filled 2017-08-08 (×37): qty 1

## 2017-08-08 MED ORDER — POTASSIUM CHLORIDE 20 MEQ PO PACK: 40.0000 meq | PACK | Freq: Three times a day (TID) | ORAL | Status: DC

## 2017-08-08 MED ORDER — POTASSIUM CHLORIDE 20 MEQ/15ML (10%) PO SOLN
20.0000 meq | Freq: Two times a day (BID) | ORAL | Status: DC
  Administered 2017-08-09 – 2017-08-26 (×34): 20 meq via ORAL
  Filled 2017-08-08 (×37): qty 15

## 2017-08-08 NOTE — Progress Notes (Signed)
PHYSICAL MEDICINE & REHABILITATION     PROGRESS NOTE    Subjective/Complaints: Had a good night sleep. No complaints this morning.   ROS: limited due to language/communication   Objective:  No results found. Recent Labs    08/07/17 0547  WBC 9.7  HGB 11.2*  HCT 33.1*  PLT 188   Recent Labs    08/07/17 0547 08/08/17 0515  NA 134* 134*  K 2.8* 3.8  CL 105 103  GLUCOSE 104* 108*  BUN 8 14  CREATININE 0.92 1.04*  CALCIUM 7.7* 8.1*   CBG (last 3)  No results for input(s): GLUCAP in the last 72 hours.  Wt Readings from Last 3 Encounters:  08/02/17 70.1 kg (154 lb 8.7 oz)  05/05/17 70.3 kg (155 lb)  04/08/17 70.3 kg (155 lb)     Intake/Output Summary (Last 24 hours) at 08/08/2017 0956 Last data filed at 08/08/2017 0900 Gross per 24 hour  Intake 360 ml  Output -  Net 360 ml    Vital Signs: Blood pressure (!) 174/66, pulse 74, temperature 99.4 F (37.4 C), temperature source Oral, resp. rate 18, SpO2 95 %. Physical Exam:  Constitutional: No distress . Vital signs reviewed. HEENT: EOMI, oral membranes moist Neck: supple Cardiovascular: RRR without murmur. No JVD    Respiratory: CTA Bilaterally without wheezes or rales. Normal effort    GI: BS +, non-tender, non-distended   Musculoskeletal:  No edema or tenderness in extremities  Neurological: She is alert and oriented x0.  Expressive aphasia/apraxia continues. Able to communicate thoughts with extra time Right central 7 Dysarthria Motor: RUE 4-/5 proximal to distal RLE: 3+ to 4-/5 HF, 4/5 distally LUE and LLE 4 to 4+/5. Apraxic, motor exam stable Sl sensory difference right arm and leg with pain provoc Skin: Skin is warm and dry.  Psychiatric: pleasant and cooperative   Assessment/Plan: 1. Functional deficits secondary to left thalamic/internal capsule hemorrhage which require 3+ hours per day of interdisciplinary therapy in a comprehensive inpatient rehab setting. Physiatrist is providing  close team supervision and 24 hour management of active medical problems listed below. Physiatrist and rehab team continue to assess barriers to discharge/monitor patient progress toward functional and medical goals.  Function:  Bathing Bathing position   Position: Bed  Bathing parts Body parts bathed by patient: Chest Body parts bathed by helper: Right arm, Left arm, Abdomen, Front perineal area, Buttocks, Right upper leg, Left upper leg, Right lower leg, Back, Left lower leg  Bathing assist Assist Level: Touching or steadying assistance(Pt > 75%)      Upper Body Dressing/Undressing Upper body dressing   What is the patient wearing?: Hospital gown                Upper body assist Assist Level: Touching or steadying assistance(Pt > 75%)      Lower Body Dressing/Undressing Lower body dressing   What is the patient wearing?: Hospital Gown, Non-skid slipper socks           Non-skid slipper socks- Performed by helper: Don/doff right sock, Don/doff left sock                  Lower body assist Assist for lower body dressing: Touching or steadying assistance (Pt > 75%)      Toileting Toileting          Toileting assist     Transfers Chair/bed transfer             Locomotion Ambulation  Wheelchair          Cognition Comprehension Comprehension assist level: Other (comment)(unable to arouse)  Expression Expression assist level: Expresses basis less than 25% of the time/requires cueing >75% of the time.(nable to arouse)  Social Interaction Social Interaction assist level: Interacts appropriately less than 25% of the time. May be withdrawn or combative.(unable to arouse)  Problem Solving Problem solving assist level: Solves basic less than 25% of the time - needs direction nearly all the time or does not effectively solve problems and may need a restraint for safety(unable to arouse)  Memory Memory assist level: Recognizes or recalls less than  25% of the time/requires cueing greater than 75% of the time(unable to arouse)   Medical Problem List and Plan: 1.  Right side weakness with aphasia secondary to left thalamic/internal capsule hemorrhage, subcortical infarcts left frontal and parietal-occipital lobes felt to be secondary to uncontrolled HTN, use of anticoagulation  -continue therapies 2.  DVT Prophylaxis/Anticoagulation: SCDs. 3. Pain Management: Tylenol as needed 4. Mood: Provide emotional support 5. Neuropsych: This patient is not capable of making decisions on her own behalf. 6. Skin/Wound Care: Routine skin checks 7. Fluids/Electrolytes/Nutrition: encourage PO intake  -hypokalemia: up to 3.8 today   -reduce kdur to 12mq bid   -recheck monday 8.  Atrial fibrillation.  Amiodarone 100 mg daily.  Cardiac rate controlled.  Eliquis on hold due to  hemorrhage 9.  Hypertension.  Coreg 12.5 mg twice daily.    -monitor SBP, borderline to high  -increase coreg to 18.757mbid 10.  Chronic diastolic congestive heart failure.  Monitor for signs of fluid overload  -check weights 11.  Hypothyroidism.  Synthroid 12.  Renal insufficiency.  Baseline creatinine 1.39.----1.04 6/28 13.  Hyperlipidemia.  Lipitor   LOS (Days) 2 A FACE TO FACE EVALUATION WAS PERFORMED  ZaMeredith StaggersMD 08/08/2017 9:56 AM

## 2017-08-08 NOTE — Progress Notes (Addendum)
Physical Therapy Session Note  Patient Details  Name: Sara Russell MRN: [id number] Date of Birth: May 14, 1930  Today's Date: 08/08/2017 PT Individual Time: 9163-8466 PT Individual Time Calculation (min): 56 min   Short Term Goals: Week 1:  PT Short Term Goal 1 (Week 1): pt will roll R with mod assist PT Short Term Goal 2 (Week 1): pt will move supine> sit with mod assist PT Short Term Goal 3 (Week 1): pt will transfer bed>< w/c with assist of 1 person, 50% of trials PT Short Term Goal 4 (Week 1): pt will propel w/c x 150' with min assist PT Short Term Goal 5 (Week 1): pt will tolerate supported standing x 5 minutes  Skilled Therapeutic Interventions/Progress Updates:  Pt received in bed with son [name]) present for session. Pt with behaviors demonstrating pain in what appears to be back & RLE - RN aware & administered meds during session. SpO2 = 98%, HR = 70 bpm. Pt dons pants from bed level with max assist and shirt sitting EOB with max assist. Pt performs rolling L<>R with mod assist and use of bed rails with decreased ability to follow commands. Pt transfers supine>sitting EOB with max assist and elects to pull on therapist's hand instead of using bed rail as instructed. Pt requires min assist for static sitting balance 2/2 slight R lateral lean. Attempted squat pivot transfer to w/c but pt with behaviors demonstrating pain therefore stedy lift used & pt able to transfer sit>stand with mod assist with therapist assisting with maintaining RUE hand hold. Transported pt to gym where pt completed another stedy transfer to 18x16 w/c for increased comfort & improved positioning, also provided pt with R half lap tray for RUE. Pt transferred sit>stand in parallel bars with max assist and LUE pulling on bar with therapist providing anterior pelvic shift. Pt able to tolerate standing <1 minute x 2 trials and on initial trial required min/mod assist for standing balance but during 2nd trial pt with  significant R lateral lean. During initial standing attempt pt attempted to advance LLE for increased weight bearing RLE for NMR & pre-gait. Pt appears to fatigue quickly throughout session & rest breaks taken PRN. At end of session pt left sitting in w/c in room with quick release belt donned & family present to supervise, call bell in reach.  Therapy Documentation Precautions:  Precautions Precautions: Fall Precaution Comments: watch BP / visual deficits; hx of spinal compression fxs (osteoporosis); L ankle fixed 2/2 ORIF 15 years ago Restrictions Weight Bearing Restrictions: No   See Function Navigator for Current Functional Status.   Therapy/Group: Individual Therapy  Waunita Schooner 08/08/2017, 12:37 PM

## 2017-08-08 NOTE — IPOC Note (Signed)
Overall Plan of Care [facility]) Patient Details Name: Sara Russell MRN: [id number] DOB: 16-Jun-1930  Admitting Diagnosis: <principal problem not specified>thalamic hemorrhage  Hospital Problems: Active Problems:   Thalamic hemorrhage ([place])     Functional Problem List: Nursing Bladder, Bowel, Endurance, Motor, Nutrition, Safety, Skin Integrity  PT Balance, Safety, Sensory, Endurance, Motor, Pain  OT Balance, Motor, Sensory, Behavior, Nutrition, Skin Integrity, Vision, Cognition, Perception, Safety, Endurance  SLP Linguistic  TR         Basic ADL's: OT Eating, Grooming, Bathing, Dressing, Toileting     Advanced  ADL's: OT       Transfers: PT Bed Mobility, Bed to Chair, Car, Manufacturing systems engineer, Metallurgist: PT Stairs, Emergency planning/management officer, Ambulation     Additional Impairments: OT    SLP Communication, Social Cognition comprehension, expression Problem Solving, Awareness, Attention  TR      Anticipated Outcomes Item Anticipated Outcome  Self Feeding S  Swallowing      Basic self-care  min A   Toileting  min A   Bathroom Transfers min A   Bowel/Bladder  Continent to bladder and bowel with mod. assist.  Transfers  min assist basic and car  Locomotion  supervision w/c x 150' controlled, 50' w/c home, min assist w/c 150' community; gait and stairs TBD  Communication  Min A for basic  Cognition  Min A  Pain  Less than 3,on 1 to 10 scale.  Safety/Judgment  Free from falls during her stay in rehab.   Therapy Plan: PT Intensity: Minimum of 1-2 x/day ,45 to 90 minutes PT Frequency: 5 out of 7 days PT Duration Estimated Length of Stay: 21-24 OT Intensity: Minimum of 1-2 x/day, 45 to 90 minutes OT Frequency: 5 out of 7 days OT Duration/Estimated Length of Stay: 21-24 days  SLP Intensity: Minumum of 1-2 x/day, 30 to 90 minutes SLP Frequency: 3 to 5 out of 7 days SLP Duration/Estimated Length of Stay: 2 to 2.5 weeks    Team  Interventions: Nursing Interventions Patient/Family Education, Bladder Management, Bowel Management, Disease Management/Prevention, Cognitive Remediation/Compensation, Skin Care/Wound Management, Discharge Planning  PT interventions Ambulation/gait training, Community reintegration, DME/adaptive equipment instruction, Neuromuscular re-education, Psychosocial support, Stair training, UE/LE Strength taining/ROM, Training and development officer, Discharge planning, Functional electrical stimulation, Pain management, Therapeutic Activities, UE/LE Coordination activities, Cognitive remediation/compensation, Functional mobility training, Patient/family education, Splinting/orthotics, Therapeutic Exercise, Visual/perceptual remediation/compensation  OT Interventions Training and development officer, Discharge planning, Community reintegration, Cognitive remediation/compensation, DME/adaptive equipment instruction, Functional mobility training, Therapeutic Activities, UE/LE Strength taining/ROM, Visual/perceptual remediation/compensation, UE/LE Coordination activities, Therapeutic Exercise, Self Care/advanced ADL retraining, Patient/family education, Neuromuscular re-education, Functional electrical stimulation, Splinting/orthotics  SLP Interventions Cueing hierarchy, Speech/Language facilitation, Therapeutic Activities, Patient/family education, Functional tasks  TR Interventions    SW/CM Interventions Discharge Planning, Psychosocial Support, Patient/Family Education   Barriers to Discharge MD  Medical stability  Nursing      PT      OT Inaccessible home environment, Decreased caregiver support, Lack of/limited family support    SLP      SW       Team Discharge Planning: Destination: PT-Assisted Living(or SNF) ,[facility] (SNF)(SNF or SLF- TBD and discussed with family ) , SLP-Skilled Nursing Facility (SNF) Projected Follow-up: PT-24 hour supervision/assistance, Home health PT, OT-  24 hour  supervision/assistance, SLP-Skilled Nursing facility Projected Equipment Needs: PT-To be determined, OT- 3 in 1 bedside comode, Tub/shower bench, To be determined, SLP-None recommended by SLP Equipment Details: PT-owns a rollator, OT-  Patient/family involved  in discharge planning: PT- Patient, Family member/caregiver,  OT- , SLP-Patient, Family member/caregiver  MD ELOS:  21-24 days  Meredith Staggers, MD, [facility]   Medical Rehab Prognosis:  Excellent Assessment: The patient has been admitted for CIR therapies with the diagnosis of thalamic hemorrrhage. The team will be addressing functional mobility, strength, stamina, balance, safety, adaptive techniques and equipment, self-care, bowel and bladder mgt, patient and caregiver education, cognition, language, communication, swallowing. Goals have been set at min assist for mobility, self-care and cognition, language. Meredith Staggers, MD, FAAPMR      See Team Conference Notes for weekly updates to the plan of care

## 2017-08-08 NOTE — Progress Notes (Signed)
Occupational Therapy Session Note  Patient Details  Name: CALLEIGH LAFONTANT MRN: [id number] Date of Birth: 16-Dec-1930  Today's Date: 08/08/2017 OT Individual Time: 5110-2111 OT Individual Time Calculation (min): 73 min    Short Term Goals: Week 1:  OT Short Term Goal 1 (Week 1): Pt will complete UB washing with moderate assist OOB OT Short Term Goal 2 (Week 1): Pt will complete BSC transfer with mod A  OT Short Term Goal 3 (Week 1): Pt will complete grooming at sink at w/c level with mod A  OT Short Term Goal 4 (Week 1): Pt will perform UB dressing using hemiplegic dressing techinques with mod A  OT Short Term Goal 5 (Week 1): Pt will eat lunch up in wheelchair with S.   Skilled Therapeutic Interventions/Progress Updates:    Treatment session with focus on Rt attention, RUE NMR, visual scanning, and postural control.  Pt received upright eating lunch with nurse tech and family member present.  Noted pt to demonstrate Rt inattention/fieldcut impacting ability to self-feed her meal.  Repositioned plate and food items to allow pt to locate items in Lt field.  Squat pivot transfer +2 to Rt to therapy mat.  Utilized Geologist, engineering for visual feedback to promote more upright sitting balance during RUE NMR and visual scanning task.  Utilized towel under RUE with therapist providing tactile cues and tapping at muscle belly to promote RUE movements.  Engaged in visual scanning task with pt demonstrating difficulty locating items in Rt visual field to complete matching activity.  Pt also demonstrating difficulty with initiation and following 2 step command, requiring increased wait time for processing and initiation as well as single step commands.  Squat pivot transfer back to Lt with +2 assist.  Engaged in sit > stand in [place] in room with min-mod assist, much improved initiation with sit > stand compared to squat pivot transfers.  Engaged in sit > stand in [place] at [employer] with focus on postural control and obtaining  and maintaining midline posture.  Returned to bed and left supine in bed with all needs in reach.  Son present throughout session, educated on Rt visual field impairment and encouraged him to sit to her Rt to promote visual scanning.  Therapy Documentation Precautions:  Precautions Precautions: Fall Precaution Comments: watch BP / visual deficits; hx of spinal compression fxs (osteoporosis); L ankle fixed 2/2 ORIF 15 years ago Restrictions Weight Bearing Restrictions: No General:   Vital Signs: Therapy Vitals Pulse Rate: (!) 58 Resp: 20 BP: (!) 134/54 Patient Position (if appropriate): Lying Oxygen Therapy SpO2: 99 % O2 Device: Room Air Pain: Pain Assessment Pain Scale: 0-10 Pain Score: 0-No pain  See Function Navigator for Current Functional Status.   Therapy/Group: Individual Therapy  Ajamu Maxon, [place] 08/08/2017, 3:00 PM

## 2017-08-08 NOTE — Progress Notes (Signed)
Speech Language Pathology Daily Session Note  Patient Details  Name: DESHAWNDA ACREY MRN: [id number] Date of Birth: 12/02/1930  Today's Date: 08/08/2017 SLP Individual Time: 2897-9150 SLP Individual Time Calculation (min): 23 min  Short Term Goals: Week 1: SLP Short Term Goal 1 (Week 1): Pt will name objects in 9 of 10 opportunities with Mod A cues.  SLP Short Term Goal 2 (Week 1): Pt will select requested object in field of 2 in 9 of 10 opportunities with Min A cues.  SLP Short Term Goal 3 (Week 1): Pt will demonstrate selective attention in mildly distracting environment for ~ 15 minutes with Mod A cues.  SLP Short Term Goal 4 (Week 1): Pt will complete basic familiar tasks with Min A cues.  SLP Short Term Goal 5 (Week 1): Pt will scan to right of midline to locate objects in 8 of 10 opportunities with Mod A cues.   Skilled Therapeutic Interventions: Skilled treatment session focused on providing Total A stimulation for arousal. Nothing successful, pt able to open eyes x 1 but returned to snoring immediately. SLP attempted to rearrange schedule to possibly treat later in day but unable. Pt's son present and voiced understanding.      Function:   Cognition Comprehension Comprehension assist level: Other (comment)(unable to arouse)  Expression   Expression assist level: Expresses basis less than 25% of the time/requires cueing >75% of the time.(nable to arouse)  Social Interaction Social Interaction assist level: Interacts appropriately less than 25% of the time. May be withdrawn or combative.(unable to arouse)  Problem Solving Problem solving assist level: Solves basic less than 25% of the time - needs direction nearly all the time or does not effectively solve problems and may need a restraint for safety(unable to arouse)  Memory Memory assist level: Recognizes or recalls less than 25% of the time/requires cueing greater than 75% of the time(unable to arouse)    Pain     Therapy/Group: Individual Therapy  Taylar Hartsough 08/08/2017, 9:00 AM

## 2017-08-09 ENCOUNTER — Inpatient Hospital Stay (HOSPITAL_COMMUNITY): Payer: No Typology Code available for payment source | Admitting: Occupational Therapy

## 2017-08-09 ENCOUNTER — Inpatient Hospital Stay (HOSPITAL_COMMUNITY): Payer: No Typology Code available for payment source | Admitting: Physical Therapy

## 2017-08-09 ENCOUNTER — Inpatient Hospital Stay (HOSPITAL_COMMUNITY): Payer: No Typology Code available for payment source

## 2017-08-09 DIAGNOSIS — I10 Essential (primary) hypertension: Secondary | ICD-10-CM

## 2017-08-09 DIAGNOSIS — R0989 Other specified symptoms and signs involving the circulatory and respiratory systems: Secondary | ICD-10-CM

## 2017-08-09 DIAGNOSIS — E876 Hypokalemia: Secondary | ICD-10-CM

## 2017-08-09 DIAGNOSIS — D62 Acute posthemorrhagic anemia: Secondary | ICD-10-CM

## 2017-08-09 DIAGNOSIS — N289 Disorder of kidney and ureter, unspecified: Secondary | ICD-10-CM

## 2017-08-09 NOTE — Progress Notes (Signed)
Physical Therapy Session Note  Patient Details  Name: PRABHLEEN MONTEMAYOR MRN: [id number] Date of Birth: [birth year]  Today's Date: 08/09/2017 PT Individual Time: 1420-1520 PT Individual Time Calculation (min): 60 min   Short Term Goals: Week 1:  PT Short Term Goal 1 (Week 1): pt will roll R with mod assist PT Short Term Goal 2 (Week 1): pt will move supine> sit with mod assist PT Short Term Goal 3 (Week 1): pt will transfer bed>< w/c with assist of 1 person, 50% of trials PT Short Term Goal 4 (Week 1): pt will propel w/c x 150' with min assist PT Short Term Goal 5 (Week 1): pt will tolerate supported standing x 5 minutes  Skilled Therapeutic Interventions/Progress Updates:   Pt in asleep in supine, easily woken up and agreeable to therapy w/ encouragement from therapist and sons. Pt grimacing w/ active RLE movements to transition to EOB, but did not c/o pain during the rest of the session. Transferred to EOB w/ max assist and to w/c via stedy w/ min-mod assist to boost into standing and min assist to maintain upright posture during transfer. Did not require 2nd helper for upright activity this session. Total assist w/c transport to/from therapy gym. Session focused on sit<>stands to hemiwalker and to walker. Performed 8-10 in total ranging from min-max assist 2/2 fatigue. Able to tolerate standing for 30-45 sec at a time before fatigue. Manual facilitation of anterior weight shifting for boosting to stand and mod tactile and manual cues for R quad activation. Tactile cues for upright posture w/ sons providing cues to "look up". Started w/ hemiwalker and transitioned to RW w/ adequate R hand grip to hold onto walker without therapist's assist. Returned to room and ended session in supine and in care of family, all needs met.    Therapy Documentation Precautions:  Precautions Precautions: Fall Precaution Comments: watch BP / visual deficits; hx of spinal compression fxs (osteoporosis); L ankle fixed  2/2 ORIF 15 years ago Restrictions Weight Bearing Restrictions: No Vital Signs: Therapy Vitals Temp: 98 F (36.7 C) Temp Source: Oral Pulse Rate: 60 Resp: 18 BP: (!) 116/58 Patient Position (if appropriate): Lying Oxygen Therapy SpO2: 99 % O2 Device: Room Air Pain: Pain Assessment Faces Pain Scale: (pt unable to state or point to number or face; pt saying "ow.") Pain Location: Leg Pain Intervention(s): RN made aware  See Function Navigator for Current Functional Status.   Therapy/Group: Individual Therapy  Nnaemeka Samson K Arnette 08/09/2017, 3:58 PM

## 2017-08-09 NOTE — Progress Notes (Signed)
Speech Language Pathology Daily Session Note  Patient Details  Name: Sara Russell MRN: [id number] Date of Birth: 1930/12/26  Today's Date: 08/09/2017 SLP Individual Time: 1100-1200 SLP Individual Time Calculation (min): 60 min  Short Term Goals: Week 1: SLP Short Term Goal 1 (Week 1): Pt will name objects in 9 of 10 opportunities with Mod A cues.  SLP Short Term Goal 2 (Week 1): Pt will select requested object in field of 2 in 9 of 10 opportunities with Min A cues.  SLP Short Term Goal 3 (Week 1): Pt will demonstrate selective attention in mildly distracting environment for ~ 15 minutes with Mod A cues.  SLP Short Term Goal 4 (Week 1): Pt will complete basic familiar tasks with Min A cues.  SLP Short Term Goal 5 (Week 1): Pt will scan to right of midline to locate objects in 8 of 10 opportunities with Mod A cues.   Skilled Therapeutic Interventions: Pt seen for speech therapy targeting language goals. Please note, pt fully awake and participatory during today's session. SLP facilitated session by providing Max A multimodal cues for completion of object naming and object retrieval tasks;. Pt accurately named 5 out of 10 objects without cues and selected requested object in field of 2 correctly on 100% of trials. She scanned to the R of midline given Mod-Max A verbal cues to utilize lighthouse technique and to draw attention to R field on 7 out of 7 opportunities; pt attempts to place objects in her line of vision (midline to L field). Required Max A multimodal cues to participate in conversational tasks re: basic, personal information (e.g. "what is your name?" "what is your son's name?," etc.). Suspect some cognitive deficits, in addition to her aphasia. She appeared to respond to melodic intonation techniques and was able to fill in the words to familiar songs x 2 with Mod I x 1. Pt and family education provided re: current status, goals for therapy, and strategies for pt's son re: communication  (e.g. simple language, more basic yes/no questions vs open-ended questions, providing choices, etc).Pt was left in room with pt's son at bedside. Call bell left within reach with all needs met. Continue current plan of care.   Function: Cognition Comprehension Comprehension assist level: Understands basic 25 - 49% of the time/ requires cueing 50 - 75% of the time  Expression   Expression assist level: Expresses basis less than 25% of the time/requires cueing >75% of the time.  Social Interaction Social Interaction assist level: Interacts appropriately less than 25% of the time. May be withdrawn or combative.  Problem Solving Problem solving assist level: Solves basic less than 25% of the time - needs direction nearly all the time or does not effectively solve problems and may need a restraint for safety  Memory Memory assist level: Recognizes or recalls less than 25% of the time/requires cueing greater than 75% of the time    Pain Pain Assessment Faces Pain Scale: (pt unable to state or point to number or face; pt saying "ow.") Pain Location: Leg Pain Intervention(s): RN made aware  Therapy/Group: Individual Therapy  Davontae Prusinski A Tyshawn Ciullo 08/09/2017, 2:27 PM

## 2017-08-09 NOTE — Progress Notes (Signed)
Occupational Therapy Session Note  Patient Details  Name: Sara Russell MRN: [id number] Date of Birth: 05/05/1930  Today's Date: 08/09/2017 OT Individual Time: 0800-0902 OT Individual Time Calculation (min): 62 min    Short Term Goals: Week 1:  OT Short Term Goal 1 (Week 1): Pt will complete UB washing with moderate assist OOB OT Short Term Goal 2 (Week 1): Pt will complete BSC transfer with mod A  OT Short Term Goal 3 (Week 1): Pt will complete grooming at sink at w/c level with mod A  OT Short Term Goal 4 (Week 1): Pt will perform UB dressing using hemiplegic dressing techinques with mod A  OT Short Term Goal 5 (Week 1): Pt will eat lunch up in wheelchair with S.   Skilled Therapeutic Interventions/Progress Updates:    Treatment session with focus on ADL retraining with bathing and dressing as well as visual attention to Rt and integration of RUE into self-care tasks.  Pt received upright in bed with son present assisting pt with breakfast. Educated pt's son on allowing pt to attempt tasks prior to assisting, with pt able to self-feed 50% of meal with Lt hand after quiche cut up into bite sized pieces.  Mod cues to visually scan and attend to food items in Rt visual field.  Completed bed mobility with mod assist and sit > stand in [place] with Min assist.  Engaged in bathing at sink in partial stand in [place] to promote midline awareness and increase weight bearing through BLE as needed for transfers and standing.  Pt required increased cues for initiation, sequencing and problem solving during bathing.  Sit > stand for perineal hygiene with min-mod assist with BUE support on Stedy bar.  Pt demonstrating loose grasp in Rt hand with inability to maintain prolonged grasp on bar during sit > stand and standing.  Educated on hemi-dressing with UB and LB dressing with pt able to don shirt with mod assist with increased time to allow pt to problem solve spontaneously.  Mod assist sit > stand without  Stedy to allow therapist to pull pants over hips.  Engaged in grooming tasks at sink with increased time and cues to obtain and maintain midline posture. Use of mirror for visual input for sitting and standing balance.  Therapy Documentation Precautions:  Precautions Precautions: Fall Precaution Comments: watch BP / visual deficits; hx of spinal compression fxs (osteoporosis); L ankle fixed 2/2 ORIF 15 years ago Restrictions Weight Bearing Restrictions: No General:   Vital Signs: Therapy Vitals Pulse Rate: 66 BP: (!) 148/68 Oxygen Therapy SpO2: 97 % Pain:  Pt with no c/o pain  See Function Navigator for Current Functional Status.   Therapy/Group: Individual Therapy  Simonne Come 08/09/2017, 8:54 AM

## 2017-08-09 NOTE — Progress Notes (Signed)
[name] PHYSICAL MEDICINE & REHABILITATION     PROGRESS NOTE    Subjective/Complaints: Patient seen lying in bed this morning. No reported issues overnight. She does not interact.  ROS: limited due to cognition  Objective:  No results found. Recent Labs    08/07/17 0547  WBC 9.7  HGB 11.2*  HCT 33.1*  PLT 188   Recent Labs    08/07/17 0547 08/08/17 0515  NA 134* 134*  K 2.8* 3.8  CL 105 103  GLUCOSE 104* 108*  BUN 8 14  CREATININE 0.92 1.04*  CALCIUM 7.7* 8.1*   CBG (last 3)  No results for input(s): GLUCAP in the last 72 hours.  Wt Readings from Last 3 Encounters:  08/09/17 69.9 kg (154 lb 1.6 oz)  08/02/17 70.1 kg (154 lb 8.7 oz)  05/05/17 70.3 kg (155 lb)     Intake/Output Summary (Last 24 hours) at 08/09/2017 0758 Last data filed at 08/08/2017 1852 Gross per 24 hour  Intake 360 ml  Output -  Net 360 ml    Vital Signs: Blood pressure (!) 148/68, pulse 66, temperature 98.7 F (37.1 C), temperature source Oral, resp. rate 20, weight 69.9 kg (154 lb 1.6 oz), SpO2 97 %. Physical Exam:  Constitutional: No distress . Vital signs reviewed. HENT: Normocephalic.  Atraumatic. Eyes: EOMI. No discharge. Cardiovascular: RRR. No JVD. Respiratory: CTA Bilaterally. Normal effort. GI: BS +. Non-distended. Musc: No edema or tenderness in extremities. Neurological: She is alert  Global aphasia Right central 7 Dysarthria Motor not participating in exam today, previously: RUE 4-/5 proximal to distal RLE: 3+ to 4-/5 HF, 4/5 distally LUE and LLE 4 to 4+/5. Apraxic Skin: Skin is warm and dry.  Psychiatric: pleasant and cooperative  Assessment/Plan: 1. Functional deficits secondary to left thalamic/internal capsule hemorrhage which require 3+ hours per day of interdisciplinary therapy in a comprehensive inpatient rehab setting. Physiatrist is providing close team supervision and 24 hour management of active medical problems listed below. Physiatrist and rehab  team continue to assess barriers to discharge/monitor patient progress toward functional and medical goals.  Function:  Bathing Bathing position   Position: Bed  Bathing parts Body parts bathed by patient: Chest Body parts bathed by helper: Right arm, Left arm, Abdomen, Front perineal area, Buttocks, Right upper leg, Left upper leg, Right lower leg, Back, Left lower leg  Bathing assist Assist Level: Touching or steadying assistance(Pt > 75%)      Upper Body Dressing/Undressing Upper body dressing   What is the patient wearing?: Hospital gown                Upper body assist Assist Level: Touching or steadying assistance(Pt > 75%)      Lower Body Dressing/Undressing Lower body dressing   What is the patient wearing?: Hospital Gown, Non-skid slipper socks           Non-skid slipper socks- Performed by helper: Don/doff right sock, Don/doff left sock                  Lower body assist Assist for lower body dressing: Touching or steadying assistance (Pt > 75%)      Toileting Toileting Toileting activity did not occur: No continent bowel/bladder event        Toileting assist     Transfers Chair/bed transfer   Chair/bed transfer method: Other Chair/bed transfer assist level: 2 helpers(pt = 25-49%) Chair/bed transfer assistive device: Mechanical lift Mechanical lift: Ecologist  Wheelchair          Cognition Comprehension Comprehension assist level: Understands basic 25 - 49% of the time/ requires cueing 50 - 75% of the time  Expression Expression assist level: Expresses basic 25 - 49% of the time/requires cueing 50 - 75% of the time. Uses single words/gestures.  Social Interaction Social Interaction assist level: Interacts appropriately 25 - 49% of time - Needs frequent redirection.  Problem Solving Problem solving assist level: Solves basic less than 25% of the time - needs direction nearly all the time or does not  effectively solve problems and may need a restraint for safety(unable to arouse)  Memory Memory assist level: Recognizes or recalls less than 25% of the time/requires cueing greater than 75% of the time(unable to arouse)   Medical Problem List and Plan: 1.  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  DVT Prophylaxis/Anticoagulation: SCDs. 3. Pain Management: Tylenol as needed 4. Mood: Provide emotional support 5. Neuropsych: This patient is not capable of making decisions on her own behalf. 6. Skin/Wound Care: Routine skin checks 7. Fluids/Electrolytes/Nutrition: encourage PO intake  -hypokalemia: 3.8 on 6/28   -reduce kdur to 31mq bid   -recheck monday 8.  Atrial fibrillation.  Amiodarone 100 mg daily.  Cardiac rate controlled.  Eliquis on hold due to  hemorrhage 9.  Hypertension.  Coreg 12.5 mg twice daily, increased coreg to 18.761mbid  Labile on 6/29 10.  Chronic diastolic congestive heart failure.  Monitor for signs of fluid overload Filed Weights   08/08/17 1000 08/09/17 0606  Weight: 69.9 kg (154 lb 1.6 oz) 69.9 kg (154 lb 1.6 oz)   Stable on 6/29 11.  Hypothyroidism.  Synthroid 12.  Renal insufficiency.  Baseline creatinine 1.39.  Creatinine 1.04 on 6/28  Continue to monitor 13.  Hyperlipidemia.  Lipitor 14. Acute blood loss anemia  Hemoglobin 11.2 on 6/27  Continue to monitor  LOS (Days) 3 A FACE TO FACE EVALUATION WAS PERFORMED  Sara Russell AnLorie PhenixMD 08/09/2017 7:58 AM

## 2017-08-10 ENCOUNTER — Inpatient Hospital Stay (HOSPITAL_COMMUNITY): Payer: No Typology Code available for payment source

## 2017-08-10 ENCOUNTER — Inpatient Hospital Stay (HOSPITAL_COMMUNITY): Payer: No Typology Code available for payment source | Admitting: Occupational Therapy

## 2017-08-10 NOTE — Progress Notes (Addendum)
At HS, difficult to arouse to give scheduled K+. Incontinent of urine X 3 this shift. Doesn't call when incontinent. No PRN meds given. Bowel sounds hyperactive. Sara Russell A

## 2017-08-10 NOTE — Progress Notes (Signed)
Occupational Therapy Session Note  Patient Details  Name: Sara Russell MRN: [id number] Date of Birth: [birth year]  Today's Date: 08/10/2017 OT Individual Time: 1117-1201 OT Individual Time Calculation (min): 44 min   Short Term Goals: Week 1:  OT Short Term Goal 1 (Week 1): Pt will complete UB washing with moderate assist OOB OT Short Term Goal 2 (Week 1): Pt will complete BSC transfer with mod A  OT Short Term Goal 3 (Week 1): Pt will complete grooming at sink at w/c level with mod A  OT Short Term Goal 4 (Week 1): Pt will perform UB dressing using hemiplegic dressing techinques with mod A  OT Short Term Goal 5 (Week 1): Pt will eat lunch up in wheelchair with S.   Skilled Therapeutic Interventions/Progress Updates:    Pt greeted in w/c with sons present. C/o R LE pain however premedicated. Tx focus was placed on Rt attention and Rt NMR. Had her complete grooming/oral care tasks at sink with [facility] for incorporating Rt as gross stabilizer. Max multimodal cues required for sequencing these tasks. She did exhibit proximal strength when assisting therapist to turn off/on faucet, wash hands, apply lotion, and lift arm onto sink. Afterwards took her to dayroom (she was able to point the way there accurately). Discussed with pt and sons methods for integrating R UE in daily activities while they are in the room with her. Also recommended bringing in comfort items, family photos, and audiobooks (from her favorite authors, as she's an avid reader) for occupational enrichment and psychosocial health. They verbalized understanding and gratefulness for education. Afterwards pt was returned to room (able to accurately point way back) and left with sons and all needs.   Therapy Documentation Precautions:  Precautions Precautions: Fall Precaution Comments: watch BP / visual deficits; hx of spinal compression fxs (osteoporosis); L ankle fixed 2/2 ORIF 15 years ago Restrictions Weight Bearing Restrictions:  No Pain: Pain Assessment Pain Score: 0-No pain ADL: ADL Eating: Not assessed Grooming: Not assessed Toileting: Not assessed Toilet Transfer: Not assessed Toilet Transfer Method: Not assessed Tub/Shower Transfer: Not assessed Walk-In Shower Transfer: Not assessed ADL Comments: see functional nav. ADLs on eval limited d/t pts ability and level of function     See Function Navigator for Current Functional Status.   Therapy/Group: Individual Therapy  Danity Schmelzer A Tyrihanna Wingert 08/10/2017, 1:00 PM

## 2017-08-10 NOTE — Progress Notes (Signed)
Physical Therapy Session Note  Patient Details  Name: Sara Russell MRN: [id number] Date of Birth: 1930-07-22  Today's Date: 08/10/2017 PT Individual Time: 0901-0945 PT Individual Time Calculation (min): 44 min   Short Term Goals: Week 1:  PT Short Term Goal 1 (Week 1): pt will roll R with mod assist PT Short Term Goal 2 (Week 1): pt will move supine> sit with mod assist PT Short Term Goal 3 (Week 1): pt will transfer bed>< w/c with assist of 1 person, 50% of trials PT Short Term Goal 4 (Week 1): pt will propel w/c x 150' with min assist PT Short Term Goal 5 (Week 1): pt will tolerate supported standing x 5 minutes  Skilled Therapeutic Interventions/Progress Updates:    Pt supine in bed upon PT arrival, agreeable to therapy tx and does not report pain however grimaces throughout session at times when moving R LE and shifting weight onto R LE. Pt transferred supine>sitting EOB with max assist, pt performed sit<>stand within stedy with mod assist and transferred to w/c. Pt transported to the gym. Pt performed sit<>stands this session from w/c with mod-max assist depending on fatigue level. In standing pt worked on pre-gait, stepping forward back in place with each LE x 2 using RW for UE support. Pt ambulated x 6 ft this session with mod assist and RW with +2 w/c follow for safety, manual facilitation for L lateral weightshift to clear R LE, R knee hyperextension noted occasionally during R LE stance. Pt performed stand pivot transfer with RW and max assist, verbal cues for techniques, RW management and foot placement. Pt with increased fatigue at this point requiring stedy transfer back to w/c. Pt left in care of family at end of session with QRB in place.   Therapy Documentation Precautions:  Precautions Precautions: Fall Precaution Comments: watch BP / visual deficits; hx of spinal compression fxs (osteoporosis); L ankle fixed 2/2 ORIF 15 years ago Restrictions Weight Bearing Restrictions:  No   See Function Navigator for Current Functional Status.   Therapy/Group: Individual Therapy  Netta Corrigan, PT, DPT 08/10/2017, 7:48 AM

## 2017-08-10 NOTE — Progress Notes (Signed)
[place] PHYSICAL MEDICINE & REHABILITATION     PROGRESS NOTE    Subjective/Complaints: Patient seen lying in bed this morning. No reported issues overnight. Eyes are open, but does not make eye contact  ROS: limited due to cognition  Objective:  No results found. No results for input(s): WBC, HGB, HCT, PLT in the last 72 hours. Recent Labs    08/08/17 0515  NA 134*  K 3.8  CL 103  GLUCOSE 108*  BUN 14  CREATININE 1.04*  CALCIUM 8.1*   CBG (last 3)  No results for input(s): GLUCAP in the last 72 hours.  Wt Readings from Last 3 Encounters:  08/10/17 71.6 kg (157 lb 13.6 oz)  08/02/17 70.1 kg (154 lb 8.7 oz)  05/05/17 70.3 kg (155 lb)     Intake/Output Summary (Last 24 hours) at 08/10/2017 0752 Last data filed at 08/09/2017 2100 Gross per 24 hour  Intake 420 ml  Output -  Net 420 ml    Vital Signs: Blood pressure (!) 158/66, pulse 70, temperature 98.5 F (36.9 C), temperature source Oral, resp. rate 19, weight 71.6 kg (157 lb 13.6 oz), SpO2 98 %. Physical Exam:  Constitutional: No distress . Vital signs reviewed. HENT: Normocephalic.  Atraumatic. Eyes: EOMI. No discharge. Cardiovascular: RRR. No JVD. Respiratory: CTA Bilaterally. Normal effort. GI: BS +. Non-distended. Musc: No edema or tenderness in extremities. Neurological: She is alert  Global aphasia Right central 7 Dysarthria Motor not participating in exam today, previously:  RUE 4-/5 proximal to distal RLE: 3+ to 4-/5 HF, 4/5 distally LUE and LLE 4 to 4+/5. Apraxic Skin: Skin is warm and dry.  Psychiatric: pleasant and cooperative  Assessment/Plan: 1. Functional deficits secondary to left thalamic/internal capsule hemorrhage which require 3+ hours per day of interdisciplinary therapy in a comprehensive inpatient rehab setting. Physiatrist is providing close team supervision and 24 hour management of active medical problems listed below. Physiatrist and rehab team continue to assess barriers  to discharge/monitor patient progress toward functional and medical goals.  Function:  Bathing Bathing position   Position: Wheelchair/chair at sink  Bathing parts Body parts bathed by patient: Chest, Abdomen Body parts bathed by helper: Right arm, Left arm, Front perineal area, Buttocks, Right upper leg, Left upper leg, Right lower leg, Left lower leg, Back  Bathing assist Assist Level: Touching or steadying assistance(Pt > 75%)      Upper Body Dressing/Undressing Upper body dressing   What is the patient wearing?: Pull over shirt/dress     Pull over shirt/dress - Perfomed by patient: Thread/unthread left sleeve, Put head through opening Pull over shirt/dress - Perfomed by helper: Thread/unthread right sleeve, Pull shirt over trunk        Upper body assist Assist Level: (Mod assist)      Lower Body Dressing/Undressing Lower body dressing   What is the patient wearing?: Pants, Non-skid slipper socks       Pants- Performed by helper: Thread/unthread right pants leg, Thread/unthread left pants leg, Pull pants up/down   Non-skid slipper socks- Performed by helper: Don/doff right sock, Don/doff left sock                  Lower body assist Assist for lower body dressing: (Total assist)      Toileting Toileting Toileting activity did not occur: No continent bowel/bladder event        Toileting assist     Transfers Chair/bed transfer   Chair/bed transfer method: Other Chair/bed transfer assist level: Total assist (  Pt < 25%) Chair/bed transfer assistive device: Mechanical lift Mechanical lift: Technical brewer Comprehension Comprehension assist level: Understands basic 25 - 49% of the time/ requires cueing 50 - 75% of the time  Expression Expression assist level: Expresses basic 25 - 49% of the time/requires cueing 50 - 75% of the time. Uses single words/gestures.  Social Interaction Social  Interaction assist level: Interacts appropriately 25 - 49% of time - Needs frequent redirection.  Problem Solving Problem solving assist level: Solves basic less than 25% of the time - needs direction nearly all the time or does not effectively solve problems and may need a restraint for safety  Memory Memory assist level: Recognizes or recalls less than 25% of the time/requires cueing greater than 75% of the time   Medical Problem List and Plan: 1.  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  DVT Prophylaxis/Anticoagulation: SCDs. 3. Pain Management: Tylenol as needed 4. Mood: Provide emotional support 5. Neuropsych: This patient is not capable of making decisions on her own behalf. 6. Skin/Wound Care: Routine skin checks 7. Fluids/Electrolytes/Nutrition: encourage PO intake  -hypokalemia: 3.8 on 6/28   -reduce kdur to 33mq bid   -recheck tomorrow 8.  Atrial fibrillation.  Amiodarone 100 mg daily.  Cardiac rate controlled.  Eliquis on hold due to  hemorrhage 9.  Hypertension.  Coreg 12.5 mg twice daily, increased coreg to 18.74mbid  Labile on 6/30 10.  Chronic diastolic congestive heart failure.  Monitor for signs of fluid overload Filed Weights   08/08/17 1000 08/09/17 0606 08/10/17 0550  Weight: 69.9 kg (154 lb 1.6 oz) 69.9 kg (154 lb 1.6 oz) 71.6 kg (157 lb 13.6 oz)   Stable on 6/29 11.  Hypothyroidism.  Synthroid 12.  Renal insufficiency.  Baseline creatinine 1.39.  Creatinine 1.04 on 6/28  Continue to monitor 13.  Hyperlipidemia.  Lipitor 14. Acute blood loss anemia  Hemoglobin 11.2 on 6/27  Continue to monitor  LOS (Days) 4 A FACE TO FACE EVALUATION WAS PERFORMED  Naydeline Morace AnLorie PhenixMD 08/10/2017 7:52 AM

## 2017-08-11 ENCOUNTER — Inpatient Hospital Stay (HOSPITAL_COMMUNITY): Payer: No Typology Code available for payment source | Admitting: Speech Pathology

## 2017-08-11 ENCOUNTER — Inpatient Hospital Stay (HOSPITAL_COMMUNITY): Payer: No Typology Code available for payment source | Admitting: Occupational Therapy

## 2017-08-11 ENCOUNTER — Inpatient Hospital Stay (HOSPITAL_COMMUNITY): Payer: Medicare Other | Admitting: Physical Therapy

## 2017-08-11 DIAGNOSIS — I69153 Hemiplegia and hemiparesis following nontraumatic intracerebral hemorrhage affecting right non-dominant side: Secondary | ICD-10-CM

## 2017-08-11 DIAGNOSIS — I69320 Aphasia following cerebral infarction: Secondary | ICD-10-CM

## 2017-08-11 LAB — BASIC METABOLIC PANEL
Anion gap: 6 (ref 5–15)
BUN: 23 mg/dL (ref 8–23)
CO2: 23 mmol/L (ref 22–32)
Calcium: 8.4 mg/dL — ABNORMAL LOW (ref 8.9–10.3)
Chloride: 103 mmol/L (ref 98–111)
Creatinine, Ser: 1.21 mg/dL — ABNORMAL HIGH (ref 0.44–1.00)
GFR calc Af Amer: 46 mL/min — ABNORMAL LOW (ref >60)
GFR calc non Af Amer: 40 mL/min — ABNORMAL LOW (ref >60)
Glucose, Bld: 99 mg/dL (ref 70–99)
Potassium: 4.4 mmol/L (ref 3.5–5.1)
Sodium: 132 mmol/L — ABNORMAL LOW (ref 135–145)

## 2017-08-11 NOTE — Progress Notes (Signed)
Occupational Therapy Session Note  Patient Details  Name: Sara Russell MRN: [id number] Date of Birth: [birth year]  Today's Date: 08/11/2017 OT Individual Time: 9562-1308 OT Individual Time Calculation (min): 60 min    Short Term Goals: Week 1:  OT Short Term Goal 1 (Week 1): Pt will complete UB washing with moderate assist OOB OT Short Term Goal 2 (Week 1): Pt will complete BSC transfer with mod A  OT Short Term Goal 3 (Week 1): Pt will complete grooming at sink at w/c level with mod A  OT Short Term Goal 4 (Week 1): Pt will perform UB dressing using hemiplegic dressing techinques with mod A  OT Short Term Goal 5 (Week 1): Pt will eat lunch up in wheelchair with S.   Skilled Therapeutic Interventions/Progress Updates:    Treatment session with focus on ADL retraining with postural control, Rt attention, incorporation of RUE, and sit > stand.  Pt received supine in bed but fully awake.  Completed bed mobility with mod-max assist and cues for sequencing due to apraxia.  Sit > stand in [place] with min-mod assist for trunk control due to Rt lean.  Engaged in [place] bathing and perineal hygiene at sit > stand level in [place] for increased focus on postural control and midline orientation during self-care tasks.  Utilized Geologist, engineering for [employer] with midline orientation.  Able to complete sit > stand from [facility] seat with min guard.  Engaged in [place] bathing with and over hand assist to wash LUE, pt demonstrating increased initiation with RUE movements.  Educated on hemi-dressing with hand over hand and backward chaining when completing dressing tasks.  Max assist sit <> stand for LB dressing with increased lean to Rt without UE support on Stedy or sink.  Engaged in visual scanning task to complete money matching activity.  Pt required to scan to Rt visual field to correctly locate coins to coincide with picture in Lt visual field.  Pt required demonstration initially and question cues throughout.  Returned to  room and left upright in w/c with quick release belt and half lap tray on and call bell in reach.  Therapy Documentation Precautions:  Precautions Precautions: Fall Precaution Comments: watch BP / visual deficits; hx of spinal compression fxs (osteoporosis); L ankle fixed 2/2 ORIF 15 years ago Restrictions Weight Bearing Restrictions: No Pain:  Pt with no c/o pain  See Function Navigator for Current Functional Status.   Therapy/Group: Individual Therapy  Simonne Come 08/11/2017, 10:42 AM

## 2017-08-11 NOTE — Progress Notes (Signed)
[place] PHYSICAL MEDICINE & REHABILITATION     PROGRESS NOTE    Subjective/Complaints:  Awakens to physical stim, aphasic  ROS: limited due to cognition  Objective:  No results found. No results for input(s): WBC, HGB, HCT, PLT in the last 72 hours. Recent Labs    08/11/17 0632  NA 132*  K 4.4  CL 103  GLUCOSE 99  BUN 23  CREATININE 1.21*  CALCIUM 8.4*   CBG (last 3)  No results for input(s): GLUCAP in the last 72 hours.  Wt Readings from Last 3 Encounters:  08/11/17 71.1 kg (156 lb 12 oz)  08/02/17 70.1 kg (154 lb 8.7 oz)  05/05/17 70.3 kg (155 lb)     Intake/Output Summary (Last 24 hours) at 08/11/2017 0733 Last data filed at 08/10/2017 2300 Gross per 24 hour  Intake 240 ml  Output -  Net 240 ml    Vital Signs: Blood pressure 136/63, pulse 64, temperature 99.1 F (37.3 C), temperature source Oral, resp. rate 19, weight 71.1 kg (156 lb 12 oz), SpO2 95 %. Physical Exam:  Constitutional: No distress . Vital signs reviewed. HENT: Normocephalic.  Atraumatic. Eyes: EOMI. No discharge. Cardiovascular: RRR. No JVD. Respiratory: CTA Bilaterally. Normal effort. GI: BS +. Non-distended. Musc: No edema or tenderness in extremities. Neurological: She is alert  Global aphasia Right central 7 Dysarthria Motor not participating in exam today, previously:  RUE 4-/5 proximal to distal RLE: 3+ to 4-/5 HF, 4/5 distally LUE and LLE 4 to 4+/5. Apraxic Skin: Skin is warm and dry.  Psychiatric: pleasant and cooperative  Assessment/Plan: 1. Functional deficits secondary to left thalamic/internal capsule hemorrhage which require 3+ hours per day of interdisciplinary therapy in a comprehensive inpatient rehab setting. Physiatrist is providing close team supervision and 24 hour management of active medical problems listed below. Physiatrist and rehab team continue to assess barriers to discharge/monitor patient progress toward functional and medical  goals.  Function:  Bathing Bathing position   Position: Wheelchair/chair at sink  Bathing parts Body parts bathed by patient: Chest, Abdomen Body parts bathed by helper: Right arm, Left arm, Front perineal area, Buttocks, Right upper leg, Left upper leg, Right lower leg, Left lower leg, Back  Bathing assist Assist Level: Touching or steadying assistance(Pt > 75%)      Upper Body Dressing/Undressing Upper body dressing   What is the patient wearing?: Pull over shirt/dress     Pull over shirt/dress - Perfomed by patient: Thread/unthread left sleeve, Put head through opening Pull over shirt/dress - Perfomed by helper: Thread/unthread right sleeve, Pull shirt over trunk        Upper body assist Assist Level: (Mod assist)      Lower Body Dressing/Undressing Lower body dressing   What is the patient wearing?: Pants, Non-skid slipper socks       Pants- Performed by helper: Thread/unthread right pants leg, Thread/unthread left pants leg, Pull pants up/down   Non-skid slipper socks- Performed by helper: Don/doff right sock, Don/doff left sock                  Lower body assist Assist for lower body dressing: (Total assist)      Toileting Toileting Toileting activity did not occur: No continent bowel/bladder event        Toileting assist     Transfers Chair/bed transfer   Chair/bed transfer method: Stand pivot Chair/bed transfer assist level: Maximal assist (Pt 25 - 49%/lift and lower) Chair/bed transfer assistive device: Walker Mechanical lift: [employer]  Locomotion Ambulation     Max distance: 6 ft Assist level: 2 helpers   Chief Financial Officer Comprehension Comprehension assist level: Understands basic 25 - 49% of the time/ requires cueing 50 - 75% of the time  Expression Expression assist level: Expresses basic 25 - 49% of the time/requires cueing 50 - 75% of the time. Uses single words/gestures.  Social Interaction Social Interaction assist  level: Interacts appropriately 25 - 49% of time - Needs frequent redirection.  Problem Solving Problem solving assist level: Solves basic less than 25% of the time - needs direction nearly all the time or does not effectively solve problems and may need a restraint for safety  Memory Memory assist level: Recognizes or recalls less than 25% of the time/requires cueing greater than 75% of the time   Medical Problem List and Plan: 1.  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  DVT Prophylaxis/Anticoagulation: SCDs. 3. Pain Management: Tylenol as needed 4. Mood: Provide emotional support 5. Neuropsych: This patient is not capable of making decisions on her own behalf. 6. Skin/Wound Care: Routine skin checks 7. Fluids/Electrolytes/Nutrition: encourage PO intake  -hypokalemia: resolved   -kdur to 6mq bid   -recheck 7/1 was 4.4 8.  Atrial fibrillation.  Amiodarone 100 mg daily.  Cardiac rate controlled.  Eliquis on hold due to  hemorrhage 9.  Hypertension.  Coreg 12.5 mg twice daily, increased coreg to 18.757mbid  Labile on 6/30 10.  Chronic diastolic congestive heart failure.  Monitor for signs of fluid overload Filed Weights   08/09/17 0606 08/10/17 0550 08/11/17 0500  Weight: 69.9 kg (154 lb 1.6 oz) 71.6 kg (157 lb 13.6 oz) 71.1 kg (156 lb 12 oz)   Stable on 7/1 11.  Hypothyroidism.  Synthroid 12.  Renal insufficiency.  Baseline creatinine 1.39.  Creatinine 1.04 on 6/28, 1.21 on 7/1  Continue to monitor 13.  Hyperlipidemia.  Lipitor 14. Acute blood loss anemia  Hemoglobin 11.2 on 6/27, 11.9 upon admit to acute  Continue to monitor  LOS (Days) 5 A FACE TO FACE EVALUATION WAS PERFORMED  AnCharlett BlakeMD 08/11/2017 7:33 AM

## 2017-08-11 NOTE — Progress Notes (Signed)
Speech Language Pathology Daily Session Note  Patient Details  Name: Sara Russell MRN: [id number] Date of Birth: 07-Nov-1930  Today's Date: 08/11/2017 SLP Individual Time: 1300-1330 SLP Individual Time Calculation (min): 30 min  Short Term Goals: Week 1: SLP Short Term Goal 1 (Week 1): Pt will name objects in 9 of 10 opportunities with Mod A cues.  SLP Short Term Goal 2 (Week 1): Pt will select requested object in field of 2 in 9 of 10 opportunities with Min A cues.  SLP Short Term Goal 3 (Week 1): Pt will demonstrate selective attention in mildly distracting environment for ~ 15 minutes with Mod A cues.  SLP Short Term Goal 4 (Week 1): Pt will complete basic familiar tasks with Min A cues.  SLP Short Term Goal 5 (Week 1): Pt will scan to right of midline to locate objects in 8 of 10 opportunities with Mod A cues.   Skilled Therapeutic Interventions: Skilled treatment session focused on communication and education. SLP facilitiated session by by having pt read short directional phrases. Pt able to read phrases with Min A but unable to demonstrate comprehension. Son was present and pt recognizes but unable to name despite Max A cues. Pt unable to answer yes/no regarding son's name and if he is her son. Pt's speech continues preseverations and jargon. Son with questions regarding how much pt understands. Education provided to facilitate comprehension with visual cues etc. Pt was left upright in wheelchair with son present.      Function:    Cognition Comprehension Comprehension assist level: Understands basic 25 - 49% of the time/ requires cueing 50 - 75% of the time;Understands basic less than 25% of the time/ requires cueing >75% of the time  Expression   Expression assist level: Expresses basis less than 25% of the time/requires cueing >75% of the time.  Social Interaction Social Interaction assist level: Interacts appropriately 25 - 49% of time - Needs frequent redirection.  Problem  Solving Problem solving assist level: Solves basic less than 25% of the time - needs direction nearly all the time or does not effectively solve problems and may need a restraint for safety  Memory Memory assist level: Recognizes or recalls less than 25% of the time/requires cueing greater than 75% of the time    Pain Pain Assessment Pain Scale: 0-10 Pain Score: 4  Pain Location: Wrist Pain Orientation: Right Pain Descriptors / Indicators: Aching Patients Stated Pain Goal: 2 Pain Intervention(s): Medication (See eMAR);Repositioned  Therapy/Group: Individual Therapy  Sara Russell 08/11/2017, 1:58 PM

## 2017-08-11 NOTE — Progress Notes (Signed)
Physical Therapy Session Note  Patient Details  Name: Sara Russell MRN: [id number] Date of Birth: [birth year]  Today's Date: 08/11/2017 PT Individual Time: 1093-2355 and 7322-0254 PT Individual Time Calculation (min): 60 min and 23 min   Short Term Goals: Week 1:  PT Short Term Goal 1 (Week 1): pt will roll R with mod assist PT Short Term Goal 2 (Week 1): pt will move supine> sit with mod assist PT Short Term Goal 3 (Week 1): pt will transfer bed>< w/c with assist of 1 person, 50% of trials PT Short Term Goal 4 (Week 1): pt will propel w/c x 150' with min assist PT Short Term Goal 5 (Week 1): pt will tolerate supported standing x 5 minutes  Skilled Therapeutic Interventions/Progress Updates:  Treatment 1: Pt received in w/c & agreeable to tx. Transported pt to/from gym via w/c total assist for time management. Gait training x 5 ft + 2 ft + 5 ft with mod assist + w/c follow for safety & 2/2 pt's poor endurance. Pt with c/o R wrist pain 2/2 significant R lateral lean and increased weight bearing through RUE on hand orthosis; redness & edema noted on wrist & therapist applied foam to orthosis for increased cushioning & RN made aware but pt with further complaints after foam applied and transitioned to ambulating with HHA +2. Pt demonstrates R lateral lean, decreased weight shifting L, decreased foot advancement RLE (intermittently requiring assistance), decreased foot clearance, dorsiflexion and heel strike RLE. Pt completes w/c<>mat table transfer with RW and max cuing for sequencing and assist with weight shifting to allow pt to progress RLE. Attempted to engage pt in standing task to reach for objects on L to promote midline orientation and weight shifting as pt with R lateral lean but pt appears fearful and states "ow" during tasks and requires to return to sitting. Pt observed to be incontinent of urine and assisted back to room where she stood at sink and experienced incontinent void. Therapist &  NT provides max assist for sit<>stand and dependent assist for peri hygiene, doffing soiled pants & donning clean ones, and donning brief; during task pt with significant R lateral lean and unable to recognize this even with mirror for visual feedback and unable to correct. At end of session pt left sitting in w/c in room with quick release belt donned & call bell in reach.   Treatment 2: Pt received in w/c & agreeable to tx. Transported pt to dayroom via w/c total assist for time management purposes. Pt transferred to standing in standing frame and therapist attempted to engage pt in reaching for objects on L to promote weight shifting to LLE and reorient pt to midline as she has significant R lateral lean. Pt unable to follow commands 2/2 receptive and expressive aphasia and on the one occasion she was able to reach for object no weight shifting was noted. Pt eventually able to reported she needed to sit 2/2 pain and rest break provided. Pt left in w/c in room with quick release belt donned & call bell in reach.  Therapy Documentation Precautions:  Precautions Precautions: Fall Precaution Comments: watch BP / visual deficits; hx of spinal compression fxs (osteoporosis); L ankle fixed 2/2 ORIF 15 years ago Restrictions Weight Bearing Restrictions: No   See Function Navigator for Current Functional Status.   Therapy/Group: Individual Therapy  Waunita Schooner 08/11/2017, 4:56 PM

## 2017-08-11 NOTE — Discharge Instructions (Signed)
Inpatient Rehab Discharge Instructions  Sara Russell Discharge date and time: No discharge date for patient encounter.   Activities/Precautions/ Functional Status: Activity: activity as tolerated Diet: regular diet Wound Care: none needed Functional status:  ___ No restrictions     ___ Walk up steps independently ___ 24/7 supervision/assistance   ___ Walk up steps with assistance ___ Intermittent supervision/assistance  ___ Bathe/dress independently ___ Walk with walker     _x__ Bathe/dress with assistance ___ Walk Independently    ___ Shower independently ___ Walk with assistance    ___ Shower with assistance ___ No alcohol     ___ Return to work/school ________  Special Instructions: Hold Eliquis until follow-up with neurology services STROKE/TIA DISCHARGE INSTRUCTIONS SMOKING Cigarette smoking nearly doubles your risk of having a stroke & is the single most alterable risk factor  If you smoke or have smoked in the last 12 months, you are advised to quit smoking for your health.  Most of the excess cardiovascular risk related to smoking disappears within a year of stopping.  Ask you doctor about anti-smoking medications  [facility] Quit Line: 1-800-QUIT NOW  Free Smoking Cessation Classes [phone]  CHOLESTEROL Know your levels; limit fat & cholesterol in your diet  Lipid Panel     Component Value Date/Time   CHOL 89 09/16/2014 0617   TRIG 76 08/05/2017 0726   HDL 13 (L) 09/16/2014 0617   CHOLHDL 6.8 09/16/2014 0617   VLDL 36 09/16/2014 0617   LDLCALC 40 09/16/2014 0617      Many patients benefit from treatment even if their cholesterol is at goal.  Goal: Total Cholesterol (CHOL) less than 160  Goal:  Triglycerides (TRIG) less than 150  Goal:  HDL greater than 40  Goal:  LDL (LDLCALC) less than 100   BLOOD PRESSURE American Stroke Association blood pressure target is less that 120/80 mm/Hg  Your discharge blood pressure is:  BP: 136/63  Monitor your blood  pressure  Limit your salt and alcohol intake  Many individuals will require more than one medication for high blood pressure  DIABETES (A1c is a blood sugar average for last 3 months) Goal HGBA1c is under 7% (HBGA1c is blood sugar average for last 3 months)  Diabetes: No known diagnosis of diabetes    Lab Results  Component Value Date   HGBA1C 5.3 08/02/2013     Your HGBA1c can be lowered with medications, healthy diet, and exercise.  Check your blood sugar as directed by your physician  Call your physician if you experience unexplained or low blood sugars.  PHYSICAL ACTIVITY/REHABILITATION Goal is 30 minutes at least 4 days per week  Activity: Increase activity slowly, Therapies: Physical Therapy: Home Health Return to work:   Activity decreases your risk of heart attack and stroke and makes your heart stronger.  It helps control your weight and blood pressure; helps you relax and can improve your mood.  Participate in a regular exercise program.  Talk with your doctor about the best form of exercise for you (dancing, walking, swimming, cycling).  DIET/WEIGHT Goal is to maintain a healthy weight  Your discharge diet is:  Diet Order           Diet regular Room service appropriate? Yes; Fluid consistency: Thin  Diet effective now          liquids Your height is:    Your current weight is: Weight: 71.1 kg (156 lb 12 oz) Your Body Mass Index (BMI) is:  BMI (Calculated):  26.89  Following the type of diet specifically designed for you will help prevent another stroke.  Your goal weight range is:    Your goal Body Mass Index (BMI) is 19-24.  Healthy food habits can help reduce 3 risk factors for stroke:  High cholesterol, hypertension, and excess weight.  RESOURCES Stroke/Support Group:  Call [phone]   STROKE EDUCATION PROVIDED/REVIEWED AND GIVEN TO PATIENT Stroke warning signs and symptoms How to activate emergency medical system (call 911). Medications prescribed at  discharge. Need for follow-up after discharge. Personal risk factors for stroke. Pneumonia vaccine given:  Flu vaccine given:  My questions have been answered, the writing is legible, and I understand these instructions.  I will adhere to these goals & educational materials that have been provided to me after my discharge from the hospital.      My questions have been answered and I understand these instructions. I will adhere to these goals and the provided educational materials after my discharge from the hospital.  Patient/Caregiver Signature _______________________________ Date __________  Clinician Signature _______________________________________ Date __________  Please bring this form and your medication list with you to all your follow-up doctor's appointments.

## 2017-08-12 ENCOUNTER — Inpatient Hospital Stay (HOSPITAL_COMMUNITY): Payer: No Typology Code available for payment source | Admitting: Occupational Therapy

## 2017-08-12 ENCOUNTER — Inpatient Hospital Stay (HOSPITAL_COMMUNITY): Payer: No Typology Code available for payment source

## 2017-08-12 ENCOUNTER — Inpatient Hospital Stay (HOSPITAL_COMMUNITY): Payer: No Typology Code available for payment source | Admitting: Physical Therapy

## 2017-08-12 ENCOUNTER — Inpatient Hospital Stay (HOSPITAL_COMMUNITY): Payer: Medicare Other | Admitting: Physical Therapy

## 2017-08-12 ENCOUNTER — Other Ambulatory Visit: Payer: Self-pay

## 2017-08-12 NOTE — Progress Notes (Signed)
Speech Language Pathology Daily Session Note  Patient Details  Name: Sara Russell MRN: [id number] Date of Birth: 01-06-1931  Today's Date: 08/12/2017 SLP Individual Time: 1100-1130 SLP Individual Time Calculation (min): 30 min  Short Term Goals: Week 1: SLP Short Term Goal 1 (Week 1): Pt will name objects in 9 of 10 opportunities with Mod A cues.  SLP Short Term Goal 2 (Week 1): Pt will select requested object in field of 2 in 9 of 10 opportunities with Min A cues.  SLP Short Term Goal 3 (Week 1): Pt will demonstrate selective attention in mildly distracting environment for ~ 15 minutes with Mod A cues.  SLP Short Term Goal 4 (Week 1): Pt will complete basic familiar tasks with Min A cues.  SLP Short Term Goal 5 (Week 1): Pt will scan to right of midline to locate objects in 8 of 10 opportunities with Mod A cues.   Skilled Therapeutic Interventions:Skilled ST services focused on speech skills. SLP facilitated expressive naming of common items utilizing [place] toolkit, pt required mod-max A sentence completion and phonemic cues. SLP facilitated receptive language skills, pt demonstrated ability to matched picture to object from a field of two given mod A verbal cues, however required max A verbal cues to match object to word from a filed of two. Pt demonstrated sustained attention with mod A verbal cues in 15 minute intervals. Pt was left in room with call bell within reach and bed alaram set.ST reccomends to continuedd skilled ST services.     Function:  Eating Eating     Eating Assist Level: More than reasonable amount of time;Set up assist for;Helper scoops food on utensil   Eating Set Up Assist For: Opening containers;Cutting food Helper [place] on Utensil: Occasionally     Cognition Comprehension Comprehension assist level: Understands basic 50 - 74% of the time/ requires cueing 25 - 49% of the time  Expression   Expression assist level: Expresses basis less than 25% of the  time/requires cueing >75% of the time.  Social Interaction Social Interaction assist level: Interacts appropriately 25 - 49% of time - Needs frequent redirection.  Problem Solving Problem solving assist level: Solves basic 25 - 49% of the time - needs direction more than half the time to initiate, plan or complete simple activities  Memory Memory assist level: Recognizes or recalls less than 25% of the time/requires cueing greater than 75% of the time    Pain Pain Assessment Pain Scale: Faces Pain Score: 0-No pain Faces Pain Scale: Hurts little more Pain Type: Acute pain Pain Location: Leg Pain Orientation: Right Pain Descriptors / Indicators: Aching Pain Frequency: Intermittent Pain Onset: Gradual Patients Stated Pain Goal: 0 Pain Intervention(s): Medication (See eMAR)  Therapy/Group: Individual Therapy  Kannen Moxey  [facility] 08/12/2017, 12:18 PM

## 2017-08-12 NOTE — Progress Notes (Signed)
Physical therapy reported to this RN that patient was having pain in her leg. RN went in to give patient 648m Tylenol and stated so as I entered the room [facility] and the patient refused and then stated that she did not want any pain medication. RN will waste medication in the pxyis.

## 2017-08-12 NOTE — Progress Notes (Signed)
[place] PHYSICAL MEDICINE & REHABILITATION     PROGRESS NOTE    Subjective/Complaints:  Remains aphasic but able to state name "Sara Russell".  Not oriented to place, follows simple commands   ROS: limited due to cognition  Objective:  No results found. No results for input(s): WBC, HGB, HCT, PLT in the last 72 hours. Recent Labs    08/11/17 0632  NA 132*  K 4.4  CL 103  GLUCOSE 99  BUN 23  CREATININE 1.21*  CALCIUM 8.4*   CBG (last 3)  No results for input(s): GLUCAP in the last 72 hours.  Wt Readings from Last 3 Encounters:  08/12/17 71.2 kg (156 lb 15.5 oz)  08/02/17 70.1 kg (154 lb 8.7 oz)  05/05/17 70.3 kg (155 lb)     Intake/Output Summary (Last 24 hours) at 08/12/2017 0733 Last data filed at 08/11/2017 1100 Gross per 24 hour  Intake 120 ml  Output -  Net 120 ml    Vital Signs: Blood pressure 139/64, pulse 66, temperature 98.6 F (37 C), resp. rate 18, weight 71.2 kg (156 lb 15.5 oz), SpO2 98 %. Physical Exam:  Constitutional: No distress . Vital signs reviewed. HENT: Normocephalic.  Atraumatic. Eyes: EOMI. No discharge. Cardiovascular: RRR. No JVD. Respiratory: CTA Bilaterally. Normal effort. GI: BS +. Non-distended. Musc: No edema or tenderness in extremities. Neurological: She is alert  Global aphasia Right central 7 Dysarthria Motor RUE 3-/5 proximal to distal RLE: 3+ to 4-/5 HF, 4/5 distally LUE and LLE 4 to 4+/5. Apraxic Skin: Skin is warm and dry.  Psychiatric: pleasant and cooperative  Assessment/Plan: 1. Functional deficits secondary to left thalamic/internal capsule hemorrhage which require 3+ hours per day of interdisciplinary therapy in a comprehensive inpatient rehab setting. Physiatrist is providing close team supervision and 24 hour management of active medical problems listed below. Physiatrist and rehab team continue to assess barriers to discharge/monitor patient progress toward functional and medical  goals.  Function:  Bathing Bathing position   Position: Wheelchair/chair at sink  Bathing parts Body parts bathed by patient: Chest, Abdomen, Right arm, Right upper leg, Left upper leg Body parts bathed by helper: Left arm, Front perineal area, Buttocks, Right lower leg, Left lower leg, Back  Bathing assist Assist Level: (Mod assist)      Upper Body Dressing/Undressing Upper body dressing   What is the patient wearing?: Pull over shirt/dress     Pull over shirt/dress - Perfomed by patient: Thread/unthread left sleeve, Put head through opening Pull over shirt/dress - Perfomed by helper: Thread/unthread right sleeve, Pull shirt over trunk        Upper body assist Assist Level: (Mod assist)      Lower Body Dressing/Undressing Lower body dressing   What is the patient wearing?: Pants, Non-skid slipper socks       Pants- Performed by helper: Thread/unthread right pants leg, Thread/unthread left pants leg, Pull pants up/down   Non-skid slipper socks- Performed by helper: Don/doff right sock, Don/doff left sock                  Lower body assist Assist for lower body dressing: (Total assist)      Toileting Toileting Toileting activity did not occur: No continent bowel/bladder event   Toileting steps completed by helper: (P) Adjust clothing prior to toileting, Performs perineal hygiene, Adjust clothing after toileting(per Elby Beck, NT)    Toileting assist     Transfers Chair/bed transfer   Chair/bed transfer method: Stand pivot Chair/bed transfer assist  level: Moderate assist (Pt 50 - 74%/lift or lower) Chair/bed transfer assistive device: Armrests(R hand orthosis) Mechanical lift: Stedy   Locomotion Ambulation Ambulation activity did not occur: (P) Safety/medical concerns   Max distance: 5 ft Assist level: 2 helpers   Wheelchair       Assist Level: (P) Dependent (Pt equals 0%)(for transport)  Cognition Comprehension Comprehension assist level:  Understands basic 50 - 74% of the time/ requires cueing 25 - 49% of the time  Expression Expression assist level: Expresses basis less than 25% of the time/requires cueing >75% of the time.  Social Interaction Social Interaction assist level: Interacts appropriately 25 - 49% of time - Needs frequent redirection.  Problem Solving Problem solving assist level: Solves basic 25 - 49% of the time - needs direction more than half the time to initiate, plan or complete simple activities  Memory Memory assist level: Recognizes or recalls less than 25% of the time/requires cueing greater than 75% of the time   Medical Problem List and Plan: 1.  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  DVT Prophylaxis/Anticoagulation: SCDs. 3. Pain Management: Tylenol as needed 4. Mood: Provide emotional support 5. Neuropsych: This patient is not capable of making decisions on her own behalf. 6. Skin/Wound Care: Routine skin checks 7. Fluids/Electrolytes/Nutrition: encourage PO intake  -hypokalemia: resolved   -kdur to 59mq bid   -recheck 7/1 was 4.4 8.  Atrial fibrillation.  Amiodarone 100 mg daily.  Cardiac rate controlled.  Eliquis on hold due to  hemorrhage 9.  Hypertension.  Coreg 12.5 mg twice daily, increased coreg to 18.764mbid   Vitals:   08/11/17 2044 08/12/17 0434  BP: 131/83 139/64  Pulse: 67 66  Resp: 18 18  Temp: 98.6 F (37 C) 98.6 F (37 C)  SpO2: 96% 98%  Controlled 7/2 10.  Chronic diastolic congestive heart failure.  Monitor for signs of fluid overload Filed Weights   08/10/17 0550 08/11/17 0500 08/12/17 0434  Weight: 71.6 kg (157 lb 13.6 oz) 71.1 kg (156 lb 12 oz) 71.2 kg (156 lb 15.5 oz)   Stable on 7/2 11.  Hypothyroidism.  Synthroid 12.  Renal insufficiency.   Baseline creatinine 1.39.  Creatinine 1.04 on 6/28, 1.21 on 7/1  Continue to monitor 13.  Hyperlipidemia.  Lipitor 14. Acute blood loss anemia  Hemoglobin 11.2 on 6/27, 11.9 upon admit to acute  Continue to monitor  LOS (Days) 6 A FACE TO FACE EVALUATION WAS PERFORMED  AnCharlett BlakeMD 08/12/2017 7:33 AM

## 2017-08-12 NOTE — Progress Notes (Signed)
Physical Therapy Session Note  Patient Details  Name: Sara Russell MRN: [id number] Date of Birth: November 06, 1930  Today's Date: 08/12/2017 PT Individual Time: 1657-9038 and 3338-3291 PT Individual Time Calculation (min): 53 min and 28 min  Short Term Goals: Week 1:  PT Short Term Goal 1 (Week 1): pt will roll R with mod assist PT Short Term Goal 2 (Week 1): pt will move supine> sit with mod assist PT Short Term Goal 3 (Week 1): pt will transfer bed>< w/c with assist of 1 person, 50% of trials PT Short Term Goal 4 (Week 1): pt will propel w/c x 150' with min assist PT Short Term Goal 5 (Week 1): pt will tolerate supported standing x 5 minutes  Skilled Therapeutic Interventions/Progress Updates:  Treatment 1: Pt received in w/c & agreeable to tx. Pt with c/o pain in R wrist with palpation and c/o BLE pain during standing - RN made aware. Transported pt room<>gym via w/c total assist for time management. Pt required multiple attempts to complete stand pivot w/c>mat table on R as pt appears to have difficulty following commands even with multimodal cuing 2/2 aphasia and appears hesitant to movel. Pt requires max assist for weight shifting L to advance RLE and cuing for LE stepping sequence. While sitting EOM pt engaged in reaching for objects on L with LUE then RUE (therapist providing support at R elbow) then tossing them, with task focusing on crossing midline for weight shifting L, RUE NMR & strengthening. Pt able to grasp objects with R hand but with difficulty releasing them. Pt then engaged in standing and weight shifting with therapist using music to increase pt engagement and therapist providing max assist for weight shifting L but pt stating "ow" frequently when doing so. Pt returns to w/c via stand pivot to L with max cuing for hand placement but slight increase ease of sequencing task. Pt noted to be incontinent & returned to room where she also had continent void on BSC. Pt completed sit<>stand  with max assist at sink for BSC placement and required min increasing to max assist for standing balance 2/2 R lateral lean while peri hygiene was performed dependent assist. At end of session pt left sitting in w/c in room with alarm belt donned & all needs within reach.   Treatment 2: Pt received in w/c & agreeable to tx. Pt with behaviors demonstrating pain in RLE with rest breaks taken PRN. Transported pt to/from gym via w/c total assist for time management. Pt transferred to standing at high/low table with max assist for transfer & once in standing pt unable to maintain R foot flat on floor with R knee flexion and significant lean on table. Pt observed to be incontinent of bowels and assisted back to room. Pt transferred to standing at sink with max assist and tolerated standing for a few minutes at a time with mod assist for standing balance while therapist and/or RN performed peri hygiene total assist. Pt with R knee buckling and foot coming up off floor during 1 standing attempt and pt returned to sitting. At end of session pt left sitting in w/c in handoff to OT.  RN and PT decided pt would benefit from timed toileting 2/2 frequent incontinence.   Therapy Documentation Precautions:  Precautions Precautions: Fall Precaution Comments: watch BP / visual deficits; hx of spinal compression fxs (osteoporosis); L ankle fixed 2/2 ORIF 15 years ago Restrictions Weight Bearing Restrictions: No   See Function Navigator for Current Functional Status.  Therapy/Group: Individual Therapy  Waunita Schooner 08/12/2017, 2:15 PM

## 2017-08-12 NOTE — Progress Notes (Signed)
Occupational Therapy Session Note  Patient Details  Name: Sara Russell MRN: [id number] Date of Birth: Feb 06, 1931  Today's Date: 08/12/2017 OT Individual Time: 8938-1017 and 5102-5852 OT Individual Time Calculation (min): 60 min and 40 min   Short Term Goals: Week 1:  OT Short Term Goal 1 (Week 1): Pt will complete UB washing with moderate assist OOB OT Short Term Goal 2 (Week 1): Pt will complete BSC transfer with mod A  OT Short Term Goal 3 (Week 1): Pt will complete grooming at sink at w/c level with mod A  OT Short Term Goal 4 (Week 1): Pt will perform UB dressing using hemiplegic dressing techinques with mod A  OT Short Term Goal 5 (Week 1): Pt will eat lunch up in wheelchair with S.   Skilled Therapeutic Interventions/Progress Updates:    1) Treatment session with focus on ADL retraining with Rt inattention and sequencing during self-feeding and dressing tasks.  Pt received upright being set up for breakfast.  Pt required increased time and cues for initiation with self-feeding.  Pt demonstrating improved visual scanning to Rt environment when RN arrived, but still with decreased attention to items on Rt of food tray.  Completed bed mobility with mod assist with increased time and cues for initiation.  Completed dressing seated at EOB with cues for hemi-dressing technique and increased initiation with LB dressing.  Increased Rt lean in standing for LB dressing, requiring max assist and manual facilitation to promote midline posture.  Stand pivot transfer to w/c with mod assist with improved sequencing and weight shift this session.  Left seated upright in w/c with seat belt alarm intact and half lap tray on for RUE support.     2) Treatment session with focus on Rt attention, visual scanning, initiation, and RUE NMR.  Pt received in handoff from PT.  Engaged in peg board activity with pt replicating patterns to address Rt attention, sequencing, and initiation.  Pt able to replicate simple 2  color pattern with min cues for initiation.  Progressed to more complicated patterns, with pt requiring increased cues for sequencing and when pattern changed from horizontal to diagonal.  Pt able to complete Lt side of "X" pattern, but required max cues to complete Rt side.  Pt would attempt to grasp pegs with Rt hand, however was unable to fully reach for pegs or manipulate them in her hand.  Therefore incorporate used of Rt hand by placing it on the table next to pegs to increase attention to pegs placed in container on Rt side.  Pt returned to room and agreeable to remaining upright in w/c.  Left with seat belt alarm intact and half lap tray on for RUE support.  Therapy Documentation Precautions:  Precautions Precautions: Fall Precaution Comments: watch BP / visual deficits; hx of spinal compression fxs (osteoporosis); L ankle fixed 2/2 ORIF 15 years ago Restrictions Weight Bearing Restrictions: No Pain: Pain Assessment Pain Scale: Faces Pain Score: 0-No pain Faces Pain Scale: Hurts little more Pain Type: Acute pain Pain Location: Leg Pain Orientation: Right Pain Descriptors / Indicators: Aching Pain Frequency: Intermittent Pain Onset: Gradual Patients Stated Pain Goal: 0 Pain Intervention(s): Medication (See eMAR)  See Function Navigator for Current Functional Status.   Therapy/Group: Individual Therapy  Simonne Come 08/12/2017, 10:59 AM

## 2017-08-13 ENCOUNTER — Inpatient Hospital Stay (HOSPITAL_COMMUNITY): Payer: Medicare Other | Admitting: Physical Therapy

## 2017-08-13 ENCOUNTER — Inpatient Hospital Stay (HOSPITAL_COMMUNITY): Payer: Medicare Other | Admitting: Speech Pathology

## 2017-08-13 ENCOUNTER — Inpatient Hospital Stay (HOSPITAL_COMMUNITY): Payer: No Typology Code available for payment source | Admitting: Occupational Therapy

## 2017-08-13 ENCOUNTER — Inpatient Hospital Stay (HOSPITAL_COMMUNITY): Payer: No Typology Code available for payment source | Admitting: Speech Pathology

## 2017-08-13 NOTE — Progress Notes (Signed)
Physical Therapy Session Note  Patient Details  Name: Sara Russell MRN: [id number] Date of Birth: 1930/10/27  Today's Date: 08/13/2017 PT Individual Time: [phone] and 3888-2800 PT Individual Time Calculation (min): 60 min and 29 min   Short Term Goals: Week 1:  PT Short Term Goal 1 (Week 1): pt will roll R with mod assist PT Short Term Goal 2 (Week 1): pt will move supine> sit with mod assist PT Short Term Goal 3 (Week 1): pt will transfer bed>< w/c with assist of 1 person, 50% of trials PT Short Term Goal 4 (Week 1): pt will propel w/c x 150' with min assist PT Short Term Goal 5 (Week 1): pt will tolerate supported standing x 5 minutes Week 2:     Skilled Therapeutic Interventions/Progress Updates:  Treatment 1: Pt received in bed & agreeable to tx. During session pt with behaviors and reporting pain in RLE when standing - RN made aware & administered meds during session. Therapist provided assistance with donning pants bed level (max assist) while instructing pt to bridge but pt with limited ability to clear buttocks off bed. Pt performed rolling L with mod assist, R with min assist with facilitation for movement and therapist pulling pants over hips. Pt transferred R sidelying>sitting EOB with multimodal cuing and extra time. Pt transfers bed>w/c on L with mod assist via stand pivot and facilitation for weight shifting & hand placement. Standing in gym with mirror for visual feedback & R HHA pt with slight increase in ability to weight shift L<>R with therapist facilitating movement. Gait with rail in hallway x 7 ft + 14 ft with mirror for ongoing visual feedback & max cuing, pt ambulates with min assist with assistance 50% of the time to advance RLE further and for weight shifting L. Pt ambulates with a significantly decreased gait speed and requires max cuing for sequencing and facilitation for weight shifting. Pt with less pushing to R during task. Pt propels w/c gym>room with L hemi  technique and extra time with min<>mod assist with max cuing for turning. At end of session pt left sitting in w/c in room with belt alarm donned & all needs within reach.  Treatment 2: Pt received in w/c with sons present for session. Therapist educated sons on pt's CLOF and therapeutic interventions. Pt with behaviors demonstrating pain in RLE intermittently during functional mobility and repositioning completed. In BI gym pt transferred sit>stand with mod assist with cuing to push up on armrests. Pt engaged in dyanvision with RUE support and reaching with LUE with task focusing on reaching L to correct R lateral lean and to work on midline orientation that worsened as pt fatigued. Pt unable to engage in task for full 2 minutes each time & had to take seated rest breaks early. Pt negotiated 3 steps (3") with L rail and +2 assist (son present for support) with pt performing 50-75% of task. Therapist provides assistance to place RLE entirely on step and max cuing for sequencing with pt electing to ascend leading with RLE. Pt propels w/c back towards room with L hemi technique and assistance and max cuing for turning. At end of session pt left sitting in w/c in room with chair alarm donned, needs within reach & sons present to supervise.   Therapy Documentation Precautions:  Precautions Precautions: Fall Precaution Comments: watch BP / visual deficits; hx of spinal compression fxs (osteoporosis); L ankle fixed 2/2 ORIF 15 years ago Restrictions Weight Bearing Restrictions: No  See Function Navigator for Current Functional Status.   Therapy/Group: Individual Therapy  Waunita Schooner 08/13/2017, 4:22 PM

## 2017-08-13 NOTE — Progress Notes (Signed)
Speech Language Pathology Daily Session Note  Patient Details  Name: Sara Russell MRN: [id number] Date of Birth: December 16, 1930  Today's Date: 08/13/2017 SLP Individual Time: 1120-1150 SLP Individual Time Calculation (min): 30 min  Short Term Goals: Week 1: SLP Short Term Goal 1 (Week 1): Pt will name objects in 9 of 10 opportunities with Mod A cues.  SLP Short Term Goal 2 (Week 1): Pt will select requested object in field of 2 in 9 of 10 opportunities with Min A cues.  SLP Short Term Goal 3 (Week 1): Pt will demonstrate selective attention in mildly distracting environment for ~ 15 minutes with Mod A cues.  SLP Short Term Goal 4 (Week 1): Pt will complete basic familiar tasks with Min A cues.  SLP Short Term Goal 5 (Week 1): Pt will scan to right of midline to locate objects in 8 of 10 opportunities with Mod A cues.   Skilled Therapeutic Interventions: Skilled treatment session focused on communication goals. Patient independently identified functional items from a field of 2 with 100% accuracy and named functional objects with 80% accuracy and Mod A verbal cues. Patient also independently named the function of the object at the phrase level with 100% accuracy. Patient engaged in a functional conversation that focused on biographical information. Patient verbalized at the phrase level but required Max-Total A to self-monitor and correct verbal perseveration. Patient left upright in wheelchair with all needs within reach. Continue with current plan of care.      Function:  Cognition Comprehension Comprehension assist level: Understands basic 50 - 74% of the time/ requires cueing 25 - 49% of the time  Expression   Expression assist level: Expresses basic 25 - 49% of the time/requires cueing 50 - 75% of the time. Uses single words/gestures.  Social Interaction Social Interaction assist level: Interacts appropriately 25 - 49% of time - Needs frequent redirection.  Problem Solving Problem solving  assist level: Solves basic 25 - 49% of the time - needs direction more than half the time to initiate, plan or complete simple activities  Memory Memory assist level: Recognizes or recalls less than 25% of the time/requires cueing greater than 75% of the time    Pain No/Denies Pain   Therapy/Group: Individual Therapy  [name] 08/13/2017, 1:29 PM

## 2017-08-13 NOTE — Progress Notes (Signed)
Social Work   [name], [name]  Social Worker  Physical Medicine and Rehabilitation  Patient Care Conference  Signed  Date of Service:  08/13/2017  1:24 PM          Signed          Show:Clear all [x] Manual[x] Template[] Copied  Added by: [x] Creighton Longley, Gardiner Rhyme, LCSW   [] Hover for details   Inpatient RehabilitationTeam Conference and Plan of Care Update Date: 08/13/2017   Time: 10:40 AM      Patient Name: Sara Russell      Medical Record Number: [id number]  Date of Birth: October 24, 1930 Sex: Female         Room/Bed: 4W20C/4W20C-01 Payor Info: Payor: MEDICARE / Plan: MEDICARE PART A AND B / Product Type: *No Product type* /     Admitting Diagnosis: Left thalamus hemorrhage  Admit Date/Time:  08/06/2017  3:56 PM Admission Comments: No comment available    Primary Diagnosis:  <principal problem not specified> Principal Problem: <principal problem not specified>       Patient Active Problem List    Diagnosis Date Noted  . Acute blood loss anemia    . Renal insufficiency    . Essential hypertension    . Labile blood pressure    . Hypokalemia    . Thalamic hemorrhage ([place]) 08/06/2017  . Hemorrhagic stroke ([place])    . Hyperlipidemia    . PAF (paroxysmal atrial fibrillation) ([place])    . Benign essential HTN    . Stage 3 chronic kidney disease ([place])    . ICH (intracerebral hemorrhage) ([place]) 08/03/2017  . At high risk for falls 02/12/2017  . Insomnia 02/12/2017  . Pulmonary nodules/lesions, multiple 01/29/2017  . Acute pain of right shoulder 01/28/2017  . Hypothyroidism 04/04/2015  . Compression fracture of L1 lumbar vertebra ([facility]) 02/23/2015  . Cholelithiases 02/23/2015  . Encounter for palliative care    . Chronic diastolic congestive heart failure ([place])    . Osteoporosis 05/13/2014  . Gout 08/16/2013  . Hyponatremia 08/10/2013  . Left atrial enlargement 01/10/2013  . A-fib ([place]) 12/08/2012  . Rosacea 05/15/2012  . Anemia due to chronic blood loss  08/28/2010  . DEGENERATIVE DISC DISEASE, LUMBAR SPINE 07/14/2009  . GERD 07/11/2009  . PERSONAL HX COLONIC POLYPS 07/11/2009  . HYPERCHOLESTEROLEMIA 04/10/2006  . HYPERTENSION, BENIGN SYSTEMIC 04/10/2006  . History of pulmonary embolism 04/10/2006  . DIVERTICULOSIS OF COLON 04/10/2006  . ACTINIC KERATOSIS 04/10/2006  . GEN OSTEOARTHROSIS INVOLVING MULTIPLE SITES 04/10/2006      Expected Discharge Date: Expected Discharge Date: 08/27/17   Team Members Present: Physician leading conference: Dr. Alysia Penna Social Worker Present: Ovidio Kin, LCSW Nurse Present: Rozetta Nunnery, RN PT Present: Lavone Nian, PT OT Present: Simonne Come, OT SLP Present: Weston Anna, SLP PPS Coordinator present : Daiva Nakayama, RN, CRRN       Current Status/Progress Goal Weekly Team Focus  Medical     aphasic, Right hemiparesis, possible apraxia  maintain medical stability  improve communication   Bowel/Bladder     2 person with steady during the day and INC at night.   To maintain a stable bowel and bladder routine.   Continue bladder training as recommended while awake.    Swallow/Nutrition/ Hydration               ADL's     mod assist bathing, min assist UB dressing, total assist LB dressing, mod-max assist stand pivot transfers  Min assist overall  dynamic sitting and standing  balance, ADL retraining, Rt attention, RUE NMR   Mobility     max assist sit>stand, mod assist stand pivot with RW, mod assist gait x 5 ft with RW & w/c follow, pt limited by significant cognitive deficits & c/o pain but unable to describe where, poor endurance  min assist overall except supervision w/c mobility  transfers, gait, midline orientation, activity tolerance, balance, NMR, strengthening   Communication     Max A  Min A  basic commands, naming common items, expressing wants/needs   Safety/Cognition/ Behavioral Observations   2 person assist with steady. Chair and bed alerm.   To reamin fall free.   Continue  to encourage safety daily and as needed.    Pain     Recently recieved PRN for headache.   To maintain pain rate of 3 or below.   Continue to treat pain as needed.    Skin     INC resulting in redness.   To remain skin breakdown free.   Continue to apply barrier cream as needed and with every diaper change.      *See Care Plan and progress notes for long and short-term goals.      Barriers to Discharge   Current Status/Progress Possible Resolutions Date Resolved   Physician     Decreased caregiver support;Medical stability     progressing towards goals  cont Neuromuscular re education      Nursing                 PT        pt will need 24 hr assist upon d/c.           OT                 SLP            SW              Discharge Planning/Teaching Needs:  Probable going to SNF-Rehab at [facility] when leaves here to continue her rehab and recovery. Three son's very involved and here daily.      Team Discussion:  Goals min assist level, currently is mod/max level of assist. Tends to pushes to the right and has right leg pain-treating with tylenol. Motor planning and initiation issues. Speech improving. Timed toileting started with nursing for continence. Expressing basic wants and needs. Plan to go to [facility] rehab once leaves here.  Revisions to Treatment Plan:  DC 7/17-to [facility]    Continued Need for Acute Rehabilitation Level of Care: The patient requires daily medical management by a physician with specialized training in physical medicine and rehabilitation for the following conditions: Daily direction of a multidisciplinary physical rehabilitation program to ensure safe treatment while eliciting the highest outcome that is of practical value to the patient.: Yes Daily analysis of laboratory values and/or radiology reports with any subsequent need for medication adjustment of medical intervention for : Neurological problems   Elease Hashimoto 08/13/2017, 1:24 PM                  Patient ID: Sara Russell, female   DOB: [birth year], 84 y.o.   MRN: [id number]

## 2017-08-13 NOTE — Progress Notes (Signed)
Occupational Therapy Session Note  Patient Details  Name: Sara Russell MRN: [id number] Date of Birth: 06-11-1930  Today's Date: 08/13/2017 OT Individual Time: 1303-1403 OT Individual Time Calculation (min): 60 min    Short Term Goals: Week 1:  OT Short Term Goal 1 (Week 1): Pt will complete UB washing with moderate assist OOB OT Short Term Goal 2 (Week 1): Pt will complete BSC transfer with mod A  OT Short Term Goal 3 (Week 1): Pt will complete grooming at sink at w/c level with mod A  OT Short Term Goal 4 (Week 1): Pt will perform UB dressing using hemiplegic dressing techinques with mod A  OT Short Term Goal 5 (Week 1): Pt will eat lunch up in wheelchair with S.   Skilled Therapeutic Interventions/Progress Updates:    Treatment session with focus on Rt attention and RUE NMR.  Pt received upright in w/c willing to engage in therapy session.  Engaged in [place] activity from seated level with focus on visual scanning and attention to Rt.  Pt completed initial 60 second trial, only selecting 5 lights, requiring >30 seconds to initiate.  Once pt initiated task, pt able to complete next 2 rounds selecting 22 and 25 lights in 60 seconds.  Pt attempted to press light in Rt visual field utilizing RUE, utilized hand over hand to complete and then resorting to only LUE.  Engaged in matching activity with colored blocks with focus on utilizing RUE.  Pt able to slide color blocks across table with RUE, requiring mod cues for initiation and sequencing and tactile cues to restrain use of LUE.  Pt able to pick blocks up from therapist's palm and place in container with increased time and dropping 25% of blocks.  Pt attempted to eat fruit and yogurt snack utilizing RUE on spoon, required hand over hand for improved success.  Pt may be successful self-feeding with Rt hand when eating finger foods as she is developing increased strength and ROM.  Pt left upright in w/c with seat belt alarm fastened and all  needs in reach.  Therapy Documentation Precautions:  Precautions Precautions: Fall Precaution Comments: watch BP / visual deficits; hx of spinal compression fxs (osteoporosis); L ankle fixed 2/2 ORIF 15 years ago Restrictions Weight Bearing Restrictions: No Pain:  Pt with no c/o pain  See Function Navigator for Current Functional Status.   Therapy/Group: Individual Therapy  Simonne Come 08/13/2017, 2:27 PM

## 2017-08-13 NOTE — Progress Notes (Signed)
Speech Language Pathology Daily Session Note  Patient Details  Name: Sara Russell MRN: [id number] Date of Birth: November 23, 1930  Today's Date: 08/13/2017 SLP Individual Time: 1500-1530 SLP Individual Time Calculation (min): 30 min  Short Term Goals: Week 1: SLP Short Term Goal 1 (Week 1): Pt will name objects in 9 of 10 opportunities with Mod A cues.  SLP Short Term Goal 2 (Week 1): Pt will select requested object in field of 2 in 9 of 10 opportunities with Min A cues.  SLP Short Term Goal 3 (Week 1): Pt will demonstrate selective attention in mildly distracting environment for ~ 15 minutes with Mod A cues.  SLP Short Term Goal 4 (Week 1): Pt will complete basic familiar tasks with Min A cues.  SLP Short Term Goal 5 (Week 1): Pt will scan to right of midline to locate objects in 8 of 10 opportunities with Mod A cues.   Skilled Therapeutic Interventions:  Pt was seen for skilled ST targeting communication goals.  Pt was seated in wheelchair upon arrival, with decreased attempts to make eye contact with therapist on her right side.  Pt had decreased initiation of functional communication but could verbalize at the phrase level during structured tasks with a written model and mod-max assist to correct perseverative errors.  Pt was left in wheelchair with sons at bedside.  Continue per current plan of care.    Function:  Eating Eating                 Cognition Comprehension Comprehension assist level: Understands basic 50 - 74% of the time/ requires cueing 25 - 49% of the time  Expression   Expression assist level: Expresses basic 25 - 49% of the time/requires cueing 50 - 75% of the time. Uses single words/gestures.  Social Interaction Social Interaction assist level: Interacts appropriately 25 - 49% of time - Needs frequent redirection.  Problem Solving Problem solving assist level: Solves basic 25 - 49% of the time - needs direction more than half the time to initiate, plan or complete  simple activities  Memory Memory assist level: Recognizes or recalls less than 25% of the time/requires cueing greater than 75% of the time    Pain Pain Assessment Pain Scale: 0-10 Pain Score: 0-No pain  Therapy/Group: Individual Therapy  Ein Rijo, Selinda Orion 08/13/2017, 3:52 PM

## 2017-08-13 NOTE — Progress Notes (Signed)
Nutrition Follow-up  DOCUMENTATION CODES:   Not applicable  INTERVENTION:  Continue Ensure Enlive po BID, each supplement provides 350 kcal and 20 grams of protein  Encourage adequate PO intake.   NUTRITION DIAGNOSIS:   Increased nutrient needs related to chronic illness(CHF) as evidenced by estimated needs; ongoing  GOAL:   Patient will meet greater than or equal to 90% of their needs; progressing  MONITOR:   PO intake, Supplement acceptance, Labs, Weight trends, I & O's, Skin  REASON FOR ASSESSMENT:   Malnutrition Screening Tool    ASSESSMENT:    84 year old right-handed female with history of mild renal insufficiency with creatinine 2.33, chronic diastolic congestive heart failure, atrial fibrillation maintained on amiodarone as well as Eliquis, DVT/PE, hypertension. Presented 08/02/2017 with right sided weakness, facial droop and aphasia.  Blood pressure 221/89.  MRI reviewed showed left thalamic hemorrhage.   Pt unavailable during time of visit. Meal completion has been varied from 10-90% with 90% at lunch today. Pt currently has Ensure ordered and has been consuming them. RD to continue with current orders to aid in caloric and protein needs.   Diet Order:   Diet Order           Diet regular Room service appropriate? Yes; Fluid consistency: Thin  Diet effective now          EDUCATION NEEDS:   Not appropriate for education at this time  Skin:  Skin Assessment: Reviewed RN Assessment  Last BM:  7/2  Height:   Ht Readings from Last 1 Encounters:  08/02/17 5' 4"  (1.626 m)    Weight:   Wt Readings from Last 1 Encounters:  08/13/17 155 lb 3.3 oz (70.4 kg)    Ideal Body Weight:  54.5 kg  BMI:  Body mass index is 26.64 kg/m.  Estimated Nutritional Needs:   Kcal:  1650-1800  Protein:  70-80 grams  Fluid:  Per MD    Corrin Parker, MS, RD, LDN Pager # [phone] After hours/ weekend pager # [phone]

## 2017-08-13 NOTE — Progress Notes (Signed)
[place] PHYSICAL MEDICINE & REHABILITATION     PROGRESS NOTE    Subjective/Complaints:  Aphasic but awake and alert, able to answer some simple Y/N questions  ROS: limited due to cognition  Objective:  No results found. No results for input(s): WBC, HGB, HCT, PLT in the last 72 hours. Recent Labs    08/11/17 0632  NA 132*  K 4.4  CL 103  GLUCOSE 99  BUN 23  CREATININE 1.21*  CALCIUM 8.4*   CBG (last 3)  No results for input(s): GLUCAP in the last 72 hours.  Wt Readings from Last 3 Encounters:  08/13/17 70.4 kg (155 lb 3.3 oz)  08/02/17 70.1 kg (154 lb 8.7 oz)  05/05/17 70.3 kg (155 lb)     Intake/Output Summary (Last 24 hours) at 08/13/2017 0907 Last data filed at 08/13/2017 0841 Gross per 24 hour  Intake 340 ml  Output 550 ml  Net -210 ml    Vital Signs: Blood pressure 136/66, pulse 64, temperature 98 F (36.7 C), temperature source Axillary, resp. rate 16, weight 70.4 kg (155 lb 3.3 oz), SpO2 98 %. Physical Exam:  Constitutional: No distress . Vital signs reviewed. HENT: Normocephalic.  Atraumatic. Eyes: EOMI. No discharge. Cardiovascular: RRR. No JVD. Respiratory: CTA Bilaterally. Normal effort. GI: BS +. Non-distended. Musc: No edema or tenderness in extremities. Neurological: She is alert  Global aphasia Right central 7 Dysarthria Motor RUE 3-/5 proximal to distal RLE: 3+ to 4-/5 HF, 4/5 distally LUE and LLE 4 to 4+/5. Apraxic Skin: Skin is warm and dry.  Psychiatric: pleasant and cooperative  Assessment/Plan: 1. Functional deficits secondary to left thalamic/internal capsule hemorrhage which require 3+ hours per day of interdisciplinary therapy in a comprehensive inpatient rehab setting. Physiatrist is providing close team supervision and 24 hour management of active medical problems listed below. Physiatrist and rehab team continue to assess barriers to discharge/monitor patient progress toward functional and medical  goals.  Function:  Bathing Bathing position   Position: Wheelchair/chair at sink  Bathing parts Body parts bathed by patient: Chest, Abdomen, Right arm, Right upper leg, Left upper leg Body parts bathed by helper: Left arm, Front perineal area, Buttocks, Right lower leg, Left lower leg, Back  Bathing assist Assist Level: (Mod assist)      Upper Body Dressing/Undressing Upper body dressing   What is the patient wearing?: Pull over shirt/dress     Pull over shirt/dress - Perfomed by patient: Thread/unthread left sleeve, Put head through opening, Pull shirt over trunk Pull over shirt/dress - Perfomed by helper: Thread/unthread right sleeve, Pull shirt over trunk        Upper body assist Assist Level: Touching or steadying assistance(Pt > 75%)      Lower Body Dressing/Undressing Lower body dressing   What is the patient wearing?: Pants, Non-skid slipper socks       Pants- Performed by helper: Thread/unthread right pants leg, Thread/unthread left pants leg, Pull pants up/down   Non-skid slipper socks- Performed by helper: Don/doff right sock, Don/doff left sock                  Lower body assist Assist for lower body dressing: (Total assist)      Toileting Toileting Toileting activity did not occur: No continent bowel/bladder event   Toileting steps completed by helper: Adjust clothing prior to toileting, Performs perineal hygiene, Adjust clothing after toileting    Toileting assist     Transfers Chair/bed transfer   Chair/bed transfer method: Stand pivot  Chair/bed transfer assist level: Maximal assist (Pt 25 - 49%/lift and lower) Chair/bed transfer assistive device: Armrests(HHA) Mechanical lift: Stedy   Locomotion Ambulation Ambulation activity did not occur: (P) Safety/medical concerns   Max distance: 5 ft Assist level: 2 helpers   Wheelchair       Assist Level: Dependent (Pt equals 0%)(for transport)  Cognition Comprehension Comprehension assist  level: Understands basic 50 - 74% of the time/ requires cueing 25 - 49% of the time  Expression Expression assist level: Expresses basis less than 25% of the time/requires cueing >75% of the time.  Social Interaction Social Interaction assist level: Interacts appropriately 25 - 49% of time - Needs frequent redirection.  Problem Solving Problem solving assist level: Solves basic 25 - 49% of the time - needs direction more than half the time to initiate, plan or complete simple activities  Memory Memory assist level: Recognizes or recalls less than 25% of the time/requires cueing greater than 75% of the time   Medical Problem List and Plan: 1.  Right side weakness with aphasia secondary to left thalamic/internal capsule hemorrhage, subcortical infarcts left frontal and parietal-occipital lobes felt to be secondary to uncontrolled HTN, use of anticoagulation  -continue CIR PT, OT, SLP, Team conference today please see physician documentation under team conference tab, met with team face-to-face to discuss problems,progress, and goals. Formulized individual treatment plan based on medical history, underlying problem and comorbidities. 2.  DVT Prophylaxis/Anticoagulation: SCDs. 3. Pain Management: Tylenol as needed 4. Mood: Provide emotional support 5. Neuropsych: This patient is not capable of making decisions on her own behalf. 6. Skin/Wound Care: Routine skin checks 7. Fluids/Electrolytes/Nutrition: encourage PO intake  -hypokalemia: resolved   -kdur to 70mq bid   -recheck 7/1 was 4.4 8.  Atrial fibrillation.  Amiodarone 100 mg daily.  Cardiac rate controlled.  Eliquis on hold due to  hemorrhage 9.  Hypertension.  Coreg 12.5 mg twice daily, increased coreg to 18.742mbid   Vitals:   08/13/17 0431 08/13/17 0852  BP: 134/64 136/66  Pulse: (!) 58 64  Resp: 16   Temp: 98 F (36.7 C)   SpO2: 98%   Controlled 7/3 10.  Chronic diastolic congestive heart failure.  Monitor for signs of fluid  overload Filed Weights   08/11/17 0500 08/12/17 0434 08/13/17 0502  Weight: 71.1 kg (156 lb 12 oz) 71.2 kg (156 lb 15.5 oz) 70.4 kg (155 lb 3.3 oz)   Stable on 7/3 11.  Hypothyroidism.  Synthroid 12.  Renal insufficiency.  Baseline creatinine 1.39.  Creatinine 1.04 on 6/28, 1.21 on 7/1  Continue to monitor 13.  Hyperlipidemia.  Lipitor 14. Acute blood loss anemia  Hemoglobin 11.2 on 6/27, 11.9 upon admit to acute  Continue to monitor  LOS (Days) 7 A FACE TO FACE EVALUATION WAS PERFORMED  AnCharlett BlakeMD 08/13/2017 9:07 AM

## 2017-08-13 NOTE — Progress Notes (Signed)
Social Work Patient ID: Sara Russell, female   DOB: [birth year], 84 y.o.   MRN: [id number]  Met with three son's of pt to discuss team conference goals min assist level and target discharge date 7/17. All pleased she is making progress and going in the right direction. Want to clarify with [facility] the hours of therapy she will receive in their rehab unit. This worker will contact [facility] to discuss and clarify the rehab program in there rehab unit. Pt has been updated and aware. Will work on the plan for the discharge target 7/17.

## 2017-08-13 NOTE — Progress Notes (Addendum)
Physical Therapy Weekly Progress Note  Patient Details  Name: Sara Russell MRN: [id number] Date of Birth: [birth year]  Beginning of progress report period: August 07, 2017 End of progress report period: August 13, 2017  Today's Date: 08/13/2017  Patient has met 3 of 5 short term goals.  Pt is making fair progress towards goals as she has been able to ambulate a max of 14 ft with rail in hallway with min assist & propel w/c with mod assist. Pt continues to be limited by cognitive deficits, poor endurance, and pushing R in standing. Pt would benefit from continued skilled PT treatment to focus on increasing safety and independence with all functional mobility, midline orientation and standing balance, and for pt/family education.   Patient continues to demonstrate the following deficits muscle weakness, decreased cardiorespiratoy endurance, decreased coordination and decreased motor planning, decreased attention to right, decreased initiation, decreased attention, decreased awareness, decreased problem solving, decreased safety awareness, decreased memory and delayed processing, and decreased sitting balance, decreased standing balance, decreased postural control, hemiplegia and decreased balance strategies and therefore will continue to benefit from skilled PT intervention to increase functional independence with mobility.  Patient progressing toward long term goals..  Continue plan of care.   Addendum: Gait goal added, see POC.  PT Short Term Goals Week 1:  PT Short Term Goal 1 (Week 1): pt will roll R with mod assist PT Short Term Goal 1 - Progress (Week 1): Met PT Short Term Goal 2 (Week 1): pt will move supine> sit with mod assist PT Short Term Goal 2 - Progress (Week 1): Met PT Short Term Goal 3 (Week 1): pt will transfer bed>< w/c with assist of 1 person, 50% of trials PT Short Term Goal 3 - Progress (Week 1): Met PT Short Term Goal 4 (Week 1): pt will propel w/c x 150' with min assist PT  Short Term Goal 4 - Progress (Week 1): Progressing toward goal PT Short Term Goal 5 (Week 1): pt will tolerate supported standing x 5 minutes PT Short Term Goal 5 - Progress (Week 1): Progressing toward goal Week 2:  PT Short Term Goal 1 (Week 2): Pt will propel w/c 150 ft with min assist. PT Short Term Goal 2 (Week 2): Pt will ambulate 25 ft with LRAD & mod assist. PT Short Term Goal 3 (Week 2): Pt will perform bed mobility with hospital bed features & min assist.   Therapy Documentation Precautions:  Precautions Precautions: Fall Precaution Comments: watch BP / visual deficits; hx of spinal compression fxs (osteoporosis); L ankle fixed 2/2 ORIF 15 years ago Restrictions Weight Bearing Restrictions: No    See Function Navigator for Current Functional Status.  Therapy/Group: Individual Therapy  Waunita Schooner 08/13/2017, 9:25 AM

## 2017-08-13 NOTE — Patient Care Conference (Signed)
Inpatient RehabilitationTeam Conference and Plan of Care Update Date: 08/13/2017   Time: 10:40 AM    Patient Name: Sara Russell      Medical Record Number: [id number]  Date of Birth: [birth year] Sex: Female         Room/Bed: 4W20C/4W20C-01 Payor Info: Payor: MEDICARE / Plan: MEDICARE PART A AND B / Product Type: *No Product type* /    Admitting Diagnosis: Left thalamus hemorrhage  Admit Date/Time:  08/06/2017  3:56 PM Admission Comments: No comment available   Primary Diagnosis:  <principal problem not specified> Principal Problem: <principal problem not specified>  Patient Active Problem List   Diagnosis Date Noted  . Acute blood loss anemia   . Renal insufficiency   . Essential hypertension   . Labile blood pressure   . Hypokalemia   . Thalamic hemorrhage ([place]) 08/06/2017  . Hemorrhagic stroke ([place])   . Hyperlipidemia   . PAF (paroxysmal atrial fibrillation) ([place])   . Benign essential HTN   . Stage 3 chronic kidney disease ([place])   . ICH (intracerebral hemorrhage) ([place]) 08/03/2017  . At high risk for falls 02/12/2017  . Insomnia 02/12/2017  . Pulmonary nodules/lesions, multiple 01/29/2017  . Acute pain of right shoulder 01/28/2017  . Hypothyroidism 04/04/2015  . Compression fracture of L1 lumbar vertebra ([facility]) 02/23/2015  . Cholelithiases 02/23/2015  . Encounter for palliative care   . Chronic diastolic congestive heart failure ([place])   . Osteoporosis 05/13/2014  . Gout 08/16/2013  . Hyponatremia 08/10/2013  . Left atrial enlargement 01/10/2013  . A-fib ([place]) 12/08/2012  . Rosacea 05/15/2012  . Anemia due to chronic blood loss 08/28/2010  . DEGENERATIVE DISC DISEASE, LUMBAR SPINE 07/14/2009  . GERD 07/11/2009  . PERSONAL HX COLONIC POLYPS 07/11/2009  . HYPERCHOLESTEROLEMIA 04/10/2006  . HYPERTENSION, BENIGN SYSTEMIC 04/10/2006  . History of pulmonary embolism 04/10/2006  . DIVERTICULOSIS OF COLON 04/10/2006  . ACTINIC KERATOSIS 04/10/2006  . GEN OSTEOARTHROSIS  INVOLVING MULTIPLE SITES 04/10/2006    Expected Discharge Date: Expected Discharge Date: 08/27/17  Team Members Present: Physician leading conference: Dr. Alysia Penna Social Worker Present: Ovidio Kin, LCSW Nurse Present: Rozetta Nunnery, RN PT Present: Lavone Nian, PT OT Present: Simonne Come, OT SLP Present: Weston Anna, SLP PPS Coordinator present : Daiva Nakayama, RN, CRRN     Current Status/Progress Goal Weekly Team Focus  Medical   aphasic, Right hemiparesis, possible apraxia  maintain medical stability  improve communication   Bowel/Bladder   2 person with steady during the day and INC at night.   To maintain a stable bowel and bladder routine.   Continue bladder training as recommended while awake.    Swallow/Nutrition/ Hydration             ADL's   mod assist bathing, min assist UB dressing, total assist LB dressing, mod-max assist stand pivot transfers  Min assist overall  dynamic sitting and standing balance, ADL retraining, Rt attention, RUE NMR   Mobility   max assist sit>stand, mod assist stand pivot with RW, mod assist gait x 5 ft with RW & w/c follow, pt limited by significant cognitive deficits & c/o pain but unable to describe where, poor endurance  min assist overall except supervision w/c mobility  transfers, gait, midline orientation, activity tolerance, balance, NMR, strengthening   Communication   Max A  Min A  basic commands, naming common items, expressing wants/needs   Safety/Cognition/ Behavioral Observations  2 person assist with steady. Chair and bed alerm.   To  reamin fall free.   Continue to encourage safety daily and as needed.    Pain   Recently recieved PRN for headache.   To maintain pain rate of 3 or below.   Continue to treat pain as needed.    Skin   INC resulting in redness.   To remain skin breakdown free.   Continue to apply barrier cream as needed and with every diaper change.       *See Care Plan and progress notes for long and  short-term goals.     Barriers to Discharge  Current Status/Progress Possible Resolutions Date Resolved   Physician    Decreased caregiver support;Medical stability     progressing towards goals  cont Neuromuscular re education      Nursing                  PT        pt will need 24 hr assist upon d/c.           OT                  SLP                SW                Discharge Planning/Teaching Needs:  Probable going to SNF-Rehab at [facility] when leaves here to continue her rehab and recovery. Three son's very involved and here daily.      Team Discussion:  Goals min assist level, currently is mod/max level of assist. Tends to pushes to the right and has right leg pain-treating with tylenol. Motor planning and initiation issues. Speech improving. Timed toileting started with nursing for continence. Expressing basic wants and needs. Plan to go to [facility] rehab once leaves here.  Revisions to Treatment Plan:  DC 7/17-to [facility]    Continued Need for Acute Rehabilitation Level of Care: The patient requires daily medical management by a physician with specialized training in physical medicine and rehabilitation for the following conditions: Daily direction of a multidisciplinary physical rehabilitation program to ensure safe treatment while eliciting the highest outcome that is of practical value to the patient.: Yes Daily analysis of laboratory values and/or radiology reports with any subsequent need for medication adjustment of medical intervention for : Neurological problems  Nettie Wyffels, Gardiner Rhyme 08/13/2017, 1:24 PM

## 2017-08-14 ENCOUNTER — Inpatient Hospital Stay (HOSPITAL_COMMUNITY): Payer: No Typology Code available for payment source | Admitting: Speech Pathology

## 2017-08-14 ENCOUNTER — Inpatient Hospital Stay (HOSPITAL_COMMUNITY): Payer: No Typology Code available for payment source | Admitting: Occupational Therapy

## 2017-08-14 ENCOUNTER — Inpatient Hospital Stay (HOSPITAL_COMMUNITY): Payer: No Typology Code available for payment source

## 2017-08-14 ENCOUNTER — Inpatient Hospital Stay (HOSPITAL_COMMUNITY): Payer: No Typology Code available for payment source | Admitting: Physical Therapy

## 2017-08-14 NOTE — Progress Notes (Signed)
Occupational Therapy Session Note  Patient Details  Name: Sara Russell MRN: [id number] Date of Birth: 09/18/1930  Today's Date: 08/14/2017 OT Individual Time: 1000-1028 OT Individual Time Calculation (min): 28 min  (make up)   Short Term Goals: Week 1:  OT Short Term Goal 1 (Week 1): Pt will complete UB washing with moderate assist OOB OT Short Term Goal 1 - Progress (Week 1): Met OT Short Term Goal 2 (Week 1): Pt will complete BSC transfer with mod A  OT Short Term Goal 2 - Progress (Week 1): Progressing toward goal OT Short Term Goal 3 (Week 1): Pt will complete grooming at sink at w/c level with mod A  OT Short Term Goal 3 - Progress (Week 1): Met OT Short Term Goal 4 (Week 1): Pt will perform UB dressing using hemiplegic dressing techinques with mod A  OT Short Term Goal 4 - Progress (Week 1): Met OT Short Term Goal 5 (Week 1): Pt will eat lunch up in wheelchair with S.  OT Short Term Goal 5 - Progress (Week 1): Met  Skilled Therapeutic Interventions/Progress Updates:    1:1. Pt and son present for sesssion. Pt with no c/o pain, and willing to work on RUE. Pt able to grasp 10 1" cubes with 3 jaw chuck to pick up and place in bin, pt then able to transfer those cubes from R to L crossing midline and 6 inch barrier in prep for box and blocks assessment. Pt does not have enough shoulder flexion to compmlete assessment yet. Pt grasps cup with forced use of RUE and visual demosntration to supinate hand to neutral to grasp around cup and bring to mouth. Pt requires tactile cues to understand to bring cup to mouth as pt originally tries to place in bin with blocks. Exited session with pt seated in w/c, QRB donned and son present to supervise  Therapy Documentation Precautions:  Precautions Precautions: Fall Precaution Comments: watch BP / visual deficits; hx of spinal compression fxs (osteoporosis); L ankle fixed 2/2 ORIF 15 years ago Restrictions Weight Bearing Restrictions:  No General:   Vital Signs: Therapy Vitals Pulse Rate: 62 BP: 132/64 Pain:    See Function Navigator for Current Functional Status.   Therapy/Group: Individual Therapy   M  08/14/2017, 10:28 AM  

## 2017-08-14 NOTE — Progress Notes (Signed)
Physical Therapy Session Note  Patient Details  Name: Sara Russell MRN: [id number] Date of Birth: January 30, 1931  Today's Date: 08/14/2017 PT Individual Time: 1302-1400 PT Individual Time Calculation (min): 58 min   Short Term Goals: Week 1:  PT Short Term Goal 1 (Week 1): pt will roll R with mod assist PT Short Term Goal 1 - Progress (Week 1): Met PT Short Term Goal 2 (Week 1): pt will move supine> sit with mod assist PT Short Term Goal 2 - Progress (Week 1): Met PT Short Term Goal 3 (Week 1): pt will transfer bed>< w/c with assist of 1 person, 50% of trials PT Short Term Goal 3 - Progress (Week 1): Met PT Short Term Goal 4 (Week 1): pt will propel w/c x 150' with min assist PT Short Term Goal 4 - Progress (Week 1): Progressing toward goal PT Short Term Goal 5 (Week 1): pt will tolerate supported standing x 5 minutes PT Short Term Goal 5 - Progress (Week 1): Progressing toward goal Week 2:  PT Short Term Goal 1 (Week 2): Pt will propel w/c 150 ft with min assist. PT Short Term Goal 2 (Week 2): Pt will ambulate 25 ft with LRAD & mod assist. PT Short Term Goal 3 (Week 2): Pt will perform bed mobility with hospital bed features & min assist. Week 3:      Skilled Therapeutic Interventions/Progress Updates:   Pt received sitting in WC and agreeable to PT. Transported to rehab gym. Sit<>stand from [facility] with mod assist squat pivot to mat table with mod assist.   Sitting balacne EOB x 5 minutes with reaching task for BUE. Min-supervision assist for improved activation of parascapular musculature.   Sit<>stand with min-mod assist from EOB x 2 with UE support on Pt and on RW min cues for upright posture in stance. Pt noted to have incontinent bowel movement.   Returned to room and performed stand pivot transfer to toilet with grab bar and min-mod assist for urination and additional BM. Sit<>stand x 3 from toilet with min assist and LUE on grab bar for clothing management and peri care completed by  PT.  Sit<>stand from [facility] with min assist x 2 in room for clothing management  Following toiletting;UE on RW and additional sit<>stnad back in rehab gym with min assist and RW. Moderate cues for proper UE placement to improve safety and encourange L lateral leaning.    Gait training with RW 79f+12ft with mod assist from PT for AD management and improved weight shift to the L to advance the RLE; and UE support through RW with R hand orthosis  Pt returned to room and performed stand pivot transfer to bed with min-mod assist and moderate cues of use of bed rail and UE support through PT. Sit>supine completed with max assist, and left supine in bed with call bell in reach and all needs met.       Therapy Documentation Precautions:  Precautions Precautions: Fall Precaution Comments: watch BP / visual deficits; hx of spinal compression fxs (osteoporosis); L ankle fixed 2/2 ORIF 15 years ago Restrictions Weight Bearing Restrictions: No    Pain: Pain Assessment Pain Scale: 0-10 Pain Score: 0-No pain   See Function Navigator for Current Functional Status.   Therapy/Group: Individual Therapy  ALorie Phenix7/05/2017, 2:02 PM

## 2017-08-14 NOTE — Progress Notes (Signed)
Speech Language Pathology Weekly Progress and Session Note  Patient Details  Name: EVIAN DERRINGER MRN: [id number] Date of Birth: 11-13-1930  Beginning of progress report period:  August 07, 2017 End of progress report period: August 14, 2017   Today's Date: 08/14/2017 SLP Individual Time: 0730-0800 SLP Individual Time Calculation (min): 30 min  Short Term Goals: Week 1: SLP Short Term Goal 1 (Week 1): Pt will name objects in 9 of 10 opportunities with Mod A cues.  SLP Short Term Goal 1 - Progress (Week 1): Met SLP Short Term Goal 2 (Week 1): Pt will select requested object in field of 2 in 9 of 10 opportunities with Min A cues.  SLP Short Term Goal 2 - Progress (Week 1): Met SLP Short Term Goal 3 (Week 1): Pt will demonstrate selective attention in mildly distracting environment for ~ 15 minutes with Mod A cues.  SLP Short Term Goal 3 - Progress (Week 1): Progressing toward goal SLP Short Term Goal 4 (Week 1): Pt will complete basic familiar tasks with Min A cues.  SLP Short Term Goal 4 - Progress (Week 1): Met SLP Short Term Goal 5 (Week 1): Pt will scan to right of midline to locate objects in 8 of 10 opportunities with Mod A cues.  SLP Short Term Goal 5 - Progress (Week 1): Met    New Short Term Goals: Week 2: SLP Short Term Goal 1 (Week 2): Pt will recognize and correct verbal errors at the word level with min assist verbal cues.   SLP Short Term Goal 2 (Week 2): Pt will demonstrate selective attention in mildly distracting environment for ~ 15 minutes with Mod A cues.  SLP Short Term Goal 3 (Week 2): Pt will complete basic familiar tasks with supervision cues.  SLP Short Term Goal 4 (Week 2): Pt will scan to right of midline to locate objects in 8 of 10 opportunities with Supervision cues.  SLP Short Term Goal 5 (Week 2): Pt will verbalize at the phrase level during nonstructured tasks with mod assist multimodal cues.    Weekly Progress Updates:   Pt has made xxx gains this reporting  period and has met 4 out of 5 short term goals.  Pt is currently mod assist for tasks due to moderately severe global aphasia.  Pt has demonstrated improved visual scanning to the right of midline and naming of basic, familiar objects.  Pt and family education is ongoing.  Pt would continue to benefit from skilled ST while inpatient in order to maximize functional independence and reduce burden of care prior to discharge.  Anticipate that pt will need 24/7 supervision at discharge in addition to X[place] follow up at next level of care.    Intensity: Minumum of 1-2 x/day, 30 to 90 minutes Frequency: 3 to 5 out of 7 days Duration/Length of Stay: 2 to 2.5 weeks Treatment/Interventions: Cueing hierarchy;Speech/Language facilitation;Therapeutic Activities;Patient/family education;Functional tasks   Daily Session  Skilled Therapeutic Interventions: Pt was seen for skilled ST targeting cognitive goals.  Pt was upright in bed with breakfast tray in front of her.  Pt needed min cues to selectively attend to task for ~5 minute intervals in a moderately distracting environment.  SLP also provided mod cues for task initiation and sequencing when self feeding.  Pt needed encouragement and increased time for initiation of verbal responses instead of nodding or shaking her head yes/no.   Pt was able to locate items to the right of midline on her lunch  tray with min cues.  Pt was left in bed with bed alarm set and call bell within reach.  Sons at bedside.  Goals updated on this date to reflect current progress and plan of care.       Function:   Eating Eating                 Cognition Comprehension Comprehension assist level: Understands basic 75 - 89% of the time/ requires cueing 10 - 24% of the time  Expression   Expression assist level: Expresses basic 25 - 49% of the time/requires cueing 50 - 75% of the time. Uses single words/gestures.  Social Interaction Social Interaction assist level: Interacts  appropriately 50 - 74% of the time - May be physically or verbally inappropriate.  Problem Solving Problem solving assist level: Solves basic 25 - 49% of the time - needs direction more than half the time to initiate, plan or complete simple activities  Memory Memory assist level: Recognizes or recalls less than 25% of the time/requires cueing greater than 75% of the time   General    Pain Pain Assessment Pain Scale: 0-10 Pain Score: 0-No pain  Therapy/Group: Individual Therapy  , Selinda Orion 08/14/2017, 12:14 PM

## 2017-08-14 NOTE — Progress Notes (Signed)
Occupational Therapy Session Note  Patient Details  Name: Sara Russell MRN: [id number] Date of Birth: [birth year]  Today's Date: 08/14/2017 OT Individual Time: 1030-1130 OT Individual Time Calculation (min): 60 min    Short Term Goals: Week 2:  OT Short Term Goal 1 (Week 2): Pt will complete BSC transfer with mod A  OT Short Term Goal 2 (Week 2): Pt will complete bathing with mod A at sit > stand level OT Short Term Goal 3 (Week 2): Pt will don pants with mod A OT Short Term Goal 4 (Week 2): Pt will utilize RUE as gross assist during self-care tasks with min cues  Skilled Therapeutic Interventions/Progress Updates:    Pt received sitting up in w/c with son present, agreeable to therapy. Session focused heavily on ADL transfers, especially sit to stands, and R UE NMR. Pt was transported down to therapy gym for time management. Pt completed stand pivot transfer from w/c to mat with mod A, with increased time required and heavy vc for LE placement. Pt practiced sit <> stand transfer several times with an emphasis on UE placement, d/t pt not reaching back for chair and pulling up on RW. Pt provided with demo re importance of proper UE placement and on fall prevention/safety awareness. Min manual facilitation provided during table top task that involved pt grasping and releasing small coins with her R UE. Pt reported pain in her shoulder with forward flexion, with AROM and AAROM. Pain subsidized with rest break. Pt was returned to room with QRB donned and all needs met.   Therapy Documentation Precautions:  Precautions Precautions: Fall Precaution Comments: watch BP / visual deficits; hx of spinal compression fxs (osteoporosis); L ankle fixed 2/2 ORIF 15 years ago Restrictions Weight Bearing Restrictions: No Pain: Pain Assessment Pain Scale: 0-10 Pain Score: 0-No pain ADL: ADL Eating: Not assessed Grooming: Not assessed Toileting: Not assessed Toilet Transfer: Not assessed Toilet  Transfer Method: Not assessed Tub/Shower Transfer: Not assessed Walk-In Shower Transfer: Not assessed ADL Comments: see functional nav. ADLs on eval limited d/t pts ability and level of function  See Function Navigator for Current Functional Status.   Therapy/Group: Individual Therapy  Curtis Sites 08/14/2017, 12:59 PM

## 2017-08-14 NOTE — Progress Notes (Signed)
[facility] PHYSICAL MEDICINE & REHABILITATION     PROGRESS NOTE    Subjective/Complaints:  Aphasic poor naming but can describe function of objects such as pen  ROS: limited due to cognition  Objective:  No results found. No results for input(s): WBC, HGB, HCT, PLT in the last 72 hours. No results for input(s): NA, K, CL, GLUCOSE, BUN, CREATININE, CALCIUM in the last 72 hours.  Invalid input(s): CO CBG (last 3)  No results for input(s): GLUCAP in the last 72 hours.  Wt Readings from Last 3 Encounters:  08/14/17 72.4 kg (159 lb 9.8 oz)  08/02/17 70.1 kg (154 lb 8.7 oz)  05/05/17 70.3 kg (155 lb)     Intake/Output Summary (Last 24 hours) at 08/14/2017 6314 Last data filed at 08/14/2017 0100 Gross per 24 hour  Intake 480 ml  Output 826 ml  Net -346 ml    Vital Signs: Blood pressure 129/62, pulse (!) 59, temperature 97.8 F (36.6 C), temperature source Oral, resp. rate 16, weight 72.4 kg (159 lb 9.8 oz), SpO2 97 %. Physical Exam:  Constitutional: No distress . Vital signs reviewed. HENT: Normocephalic.  Atraumatic. Eyes: EOMI. No discharge. Cardiovascular: RRR. No JVD. Respiratory: CTA Bilaterally. Normal effort. GI: BS +. Non-distended. Musc: No edema or tenderness in extremities. Neurological: She is alert  Global aphasia Right central 7 Dysarthria Motor RUE 3-/5 proximal to distal RLE: 3+ to 4-/5 HF, 4/5 distally LUE and LLE 4 to 4+/5. Apraxic Skin: Skin is warm and dry.  Psychiatric: pleasant and cooperative  Assessment/Plan: 1. Functional deficits secondary to left thalamic/internal capsule hemorrhage which require 3+ hours per day of interdisciplinary therapy in a comprehensive inpatient rehab setting. Physiatrist is providing close team supervision and 24 hour management of active medical problems listed below. Physiatrist and rehab team continue to assess barriers to discharge/monitor patient progress toward functional and medical  goals.  Function:  Bathing Bathing position   Position: Wheelchair/chair at sink  Bathing parts Body parts bathed by patient: Chest, Abdomen, Right arm, Right upper leg, Left upper leg Body parts bathed by helper: Left arm, Front perineal area, Buttocks, Right lower leg, Left lower leg, Back  Bathing assist Assist Level: (Mod assist)      Upper Body Dressing/Undressing Upper body dressing   What is the patient wearing?: Pull over shirt/dress     Pull over shirt/dress - Perfomed by patient: Thread/unthread left sleeve, Put head through opening, Pull shirt over trunk Pull over shirt/dress - Perfomed by helper: Thread/unthread right sleeve, Pull shirt over trunk        Upper body assist Assist Level: Touching or steadying assistance(Pt > 75%)      Lower Body Dressing/Undressing Lower body dressing   What is the patient wearing?: Pants, Non-skid slipper socks       Pants- Performed by helper: Thread/unthread right pants leg, Thread/unthread left pants leg, Pull pants up/down   Non-skid slipper socks- Performed by helper: Don/doff right sock, Don/doff left sock                  Lower body assist Assist for lower body dressing: (Total assist)      Toileting Toileting Toileting activity did not occur: No continent bowel/bladder event   Toileting steps completed by helper: Adjust clothing prior to toileting, Performs perineal hygiene, Adjust clothing after toileting    Toileting assist     Transfers Chair/bed transfer   Chair/bed transfer method: Stand pivot Chair/bed transfer assist level: Moderate assist (Pt 50 -  74%/lift or lower) Chair/bed transfer assistive device: Armrests Mechanical lift: Ecologist Ambulation activity did not occur: (P) Safety/medical concerns   Max distance: 14 ft Assist level: Touching or steadying assistance (Pt > 75%)   Wheelchair   Type: Manual Max wheelchair distance: 100 ft Assist Level: Moderate assistance  (Pt 50 - 74%)  Cognition Comprehension Comprehension assist level: Understands basic 50 - 74% of the time/ requires cueing 25 - 49% of the time  Expression Expression assist level: Expresses basic 25 - 49% of the time/requires cueing 50 - 75% of the time. Uses single words/gestures.  Social Interaction Social Interaction assist level: Interacts appropriately 25 - 49% of time - Needs frequent redirection.  Problem Solving Problem solving assist level: Solves basic 25 - 49% of the time - needs direction more than half the time to initiate, plan or complete simple activities  Memory Memory assist level: Recognizes or recalls less than 25% of the time/requires cueing greater than 75% of the time   Medical Problem List and Plan: 1.  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  DVT Prophylaxis/Anticoagulation: SCDs. 3. Pain Management: Tylenol as needed 4. Mood: Provide emotional support 5. Neuropsych: This patient is not capable of making decisions on her own behalf. 6. Skin/Wound Care: Routine skin checks 7. Fluids/Electrolytes/Nutrition: encourage PO intake  -hypokalemia: resolved   -kdur to 31mq bid   -recheck 7/1 was 4.4 8.  Atrial fibrillation.  Amiodarone 100 mg daily.  Cardiac rate controlled.  Eliquis on hold due to  hemorrhage 9.  Hypertension.  Coreg 12.5 mg twice daily, increased coreg to 18.736mbid   Vitals:   08/13/17 2012 08/14/17 0428  BP: (!) 110/54 129/62  Pulse: 87 (!) 59  Resp: 18 16  Temp: 98.4 F (36.9 C) 97.8 F (36.6 C)  SpO2: 98% 97%  Controlled 7/4 10.  Chronic diastolic congestive heart failure.  Monitor for signs of fluid overload Filed Weights   08/12/17 0434 08/13/17 0502 08/14/17 0428  Weight: 71.2 kg (156 lb 15.5 oz) 70.4 kg (155 lb 3.3  oz) 72.4 kg (159 lb 9.8 oz)   Stable on 7/4 11.  Hypothyroidism.  Synthroid 12.  Renal insufficiency.  Baseline creatinine 1.39.  Creatinine 1.04 on 6/28, 1.21 on 7/1, recheck in am  Continue to monitor, Low intake I 48050m3.  Hyperlipidemia.  Lipitor 14. Acute blood loss anemia  Hemoglobin 11.2 on 6/27, 11.9 upon admit to acute  Continue to monitor  LOS (Days) 8 A FACE TO FACE EVALUATION WAS PERFORMED  AndCharlett BlakeD 08/14/2017 8:22 AM

## 2017-08-14 NOTE — Progress Notes (Signed)
Occupational Therapy Weekly Progress Note  Patient Details  Name: Sara Russell MRN: [id number] Date of Birth: 12/14/1930  Beginning of progress report period: August 06, 2017 End of progress report period: August 14, 2017  Today's Date: 08/14/2017 OT Individual Time: 3295-1884 OT Individual Time Calculation (min): 45 min    Patient has met 4 of 5 short term goals.  Pt is making steady progress towards goals. Pt is demonstrating increased awareness of Rt UE and Rt environment as well as increased attempts at incorporating RUE into functional tasks.  Pt currently requires mod-max assist for stand pivot transfers due to heavy pushing to Rt and decreased midline orientation.  Currently requires cues for initiation, sequencing, and awareness during self-care tasks.    Patient continues to demonstrate the following deficits: muscle weakness, decreased coordination and decreased motor planning, decreased midline orientation, decreased attention to right and decreased motor planning, decreased initiation, decreased attention, decreased awareness, decreased problem solving, decreased safety awareness, decreased memory and delayed processing, peripheral and decreased sitting balance, decreased standing balance, decreased postural control, hemiplegia and decreased balance strategies and therefore will continue to benefit from skilled OT intervention to enhance overall performance with BADL and Reduce care partner burden.  Patient progressing toward long term goals..  Continue plan of care.  OT Short Term Goals Week 1:  OT Short Term Goal 1 (Week 1): Pt will complete UB washing with moderate assist OOB OT Short Term Goal 1 - Progress (Week 1): Met OT Short Term Goal 2 (Week 1): Pt will complete BSC transfer with mod A  OT Short Term Goal 2 - Progress (Week 1): Progressing toward goal OT Short Term Goal 3 (Week 1): Pt will complete grooming at sink at w/c level with mod A  OT Short Term Goal 3 - Progress  (Week 1): Met OT Short Term Goal 4 (Week 1): Pt will perform UB dressing using hemiplegic dressing techinques with mod A  OT Short Term Goal 4 - Progress (Week 1): Met OT Short Term Goal 5 (Week 1): Pt will eat lunch up in wheelchair with S.  OT Short Term Goal 5 - Progress (Week 1): Met Week 2:  OT Short Term Goal 1 (Week 2): Pt will complete BSC transfer with mod A  OT Short Term Goal 2 (Week 2): Pt will complete bathing with mod A at sit > stand level OT Short Term Goal 3 (Week 2): Pt will don pants with mod A OT Short Term Goal 4 (Week 2): Pt will utilize RUE as gross assist during self-care tasks with min cues  Skilled Therapeutic Interventions/Progress Updates:    Treatment session with focus on ADL retraining with Rt attention, functional use of RUE during bathing, and focus on sequencing and initiation during self-care tasks of bathing and dressing.  Pt received supine in bed, agreeable to therapy session.  Completed bed mobility with assist only to advance RLE towards EOB to initiate transfer with pt able to complete remainder of transition to EOB with min assist.  Stand pivot transfer Mod assist with improved initiation with sit > stand, however required max multimodal cues for sequencing of transfer to w/c.  Bathing completed at sit > stand level at sink with improved sequencing with UB bathing, even initiating washing Lt arm with RUE.  Required hand over hand for thoroughness when utilizing RUE in bathing.  Mod assist sit > stand at sink with decreased pushing when able to have UE support on sink when standing, however then therapist  completing all bathing and dressing tasks.  Encouraged pt to attempt pulling pants over hips, however did not release grasp on sink to assist.  Completed grooming tasks in sitting with pt utilizing LUE when brushing teeth.  Pt demonstrating improved range with RUE, reaching in various planes <90* to reach for therapist.  Pt left upright in w/c with seat belt alarm  on and son in room.  Therapy Documentation Precautions:  Precautions Precautions: Fall Precaution Comments: watch BP / visual deficits; hx of spinal compression fxs (osteoporosis); L ankle fixed 2/2 ORIF 15 years ago Restrictions Weight Bearing Restrictions: No General:   Vital Signs: Therapy Vitals Temp: 97.8 F (36.6 C) Temp Source: Oral Pulse Rate: (!) 59 Resp: 16 BP: 129/62 Patient Position (if appropriate): Lying Oxygen Therapy SpO2: 97 % O2 Device: Room Air Pain:    See Function Navigator for Current Functional Status.   Therapy/Group: Individual Therapy  Simonne Come 08/14/2017, 6:56 AM

## 2017-08-15 ENCOUNTER — Inpatient Hospital Stay (HOSPITAL_COMMUNITY): Payer: Medicare Other | Admitting: Physical Therapy

## 2017-08-15 ENCOUNTER — Inpatient Hospital Stay (HOSPITAL_COMMUNITY): Payer: No Typology Code available for payment source | Admitting: Occupational Therapy

## 2017-08-15 ENCOUNTER — Inpatient Hospital Stay (HOSPITAL_COMMUNITY): Payer: No Typology Code available for payment source | Admitting: Speech Pathology

## 2017-08-15 ENCOUNTER — Inpatient Hospital Stay (HOSPITAL_COMMUNITY): Payer: No Typology Code available for payment source

## 2017-08-15 LAB — BASIC METABOLIC PANEL
Anion gap: 8 (ref 5–15)
BUN: 26 mg/dL — ABNORMAL HIGH (ref 8–23)
CO2: 25 mmol/L (ref 22–32)
Calcium: 8.8 mg/dL — ABNORMAL LOW (ref 8.9–10.3)
Chloride: 102 mmol/L (ref 98–111)
Creatinine, Ser: 1.3 mg/dL — ABNORMAL HIGH (ref 0.44–1.00)
GFR calc Af Amer: 42 mL/min — ABNORMAL LOW (ref >60)
GFR calc non Af Amer: 36 mL/min — ABNORMAL LOW (ref >60)
Glucose, Bld: 102 mg/dL — ABNORMAL HIGH (ref 70–99)
Potassium: 4.3 mmol/L (ref 3.5–5.1)
Sodium: 135 mmol/L (ref 135–145)

## 2017-08-15 LAB — CBC
HCT: 35.3 % — ABNORMAL LOW (ref 36.0–46.0)
Hemoglobin: 11.4 g/dL — ABNORMAL LOW (ref 12.0–15.0)
MCH: 30.2 pg (ref 26.0–34.0)
MCHC: 32.3 g/dL (ref 30.0–36.0)
MCV: 93.4 fL (ref 78.0–100.0)
Platelets: 301 10*3/uL (ref 150–400)
RBC: 3.78 MIL/uL — ABNORMAL LOW (ref 3.87–5.11)
RDW: 12.6 % (ref 11.5–15.5)
WBC: 8.2 10*3/uL (ref 4.0–10.5)

## 2017-08-15 NOTE — Progress Notes (Signed)
Physical Therapy Session Note  Patient Details  Name: Sara Russell MRN: [id number] Date of Birth: 1930-09-11  Today's Date: 08/15/2017 PT Individual Time: [phone] and 1400-1414 PT Individual Time Calculation (min): 58 min and 14 min  Short Term Goals: Week 2:  PT Short Term Goal 1 (Week 2): Pt will propel w/c 150 ft with min assist. PT Short Term Goal 2 (Week 2): Pt will ambulate 25 ft with LRAD & mod assist. PT Short Term Goal 3 (Week 2): Pt will perform bed mobility with hospital bed features & min assist.  Skilled Therapeutic Interventions/Progress Updates:  Treatment 1: Pt received in bed. Pt with behaviors (stating "ow") when moving/using RLE - RN aware but pt declined pain meds. Pt with inconsistent yes/no answers throughout session. Pt performed rolling L<>R in bed with min assist and cuing to utilize bed rails. Therapist donned clean brief as pt with incontinence of urine. Pt transferred to EOB with supervision and HOB elevated. Therapist assisted pt with donning pants, shirt, socks, shoes standing/sitting EOB. Pt required max fading to min assist for sit>stand transfers and mod assist for standing balance while pt assisted with clothing management. Pt stand pivots to w/c with mod assist. In gym pt began to engage in standing balance activity with RUE supported on RW hand splint and pt reaching to L to decrease R lateral lean when pt experienced incontinent void through brief and pants. Pt returned to room total assist and pt completed sit<>stand with mod assist with focus on hand placement on armrest throughout session for increased safety. Pt performed peri hygiene with mod assist for standing balance & RUE supported on sink. Pt required assistance for donning clean clothing. At end of session pt left sitting in w/c with chair alarm donned & all needs within reach.  While standing pt demonstrates R knee flexion and does not maintain foot flat on floor with therapist providing max cuing  for posture, placement and midline orientation.  Treatment 2: Pt received in bed with RN in room. RN reports pt has been lethargic since receiving BP meds this AM. Pt snoring but decreased somewhat with light sternal rub. Pt observed to be incontinent of BM and rolled L<>R with total assist, able to initiate UE movement towards bed rail with assistance. Therapist and RN performed peri hygiene total assist with pt requiring extra time and max encouragement to open eyes. Pt only able to maintain eyes open for brief periods of time and unable to focus and look at therapist when speaking to her. Pt left in bed in care of RN. Pt missed 16 minutes of skilled PT treatment, will f/u per POC.  Therapy Documentation Precautions:  Precautions Precautions: Fall Precaution Comments: watch BP / visual deficits; hx of spinal compression fxs (osteoporosis); L ankle fixed 2/2 ORIF 15 years ago Restrictions Weight Bearing Restrictions: No   General: PT Amount of Missed Time (min): 16 Minutes PT Missed Treatment Reason: (lethargy)   See Function Navigator for Current Functional Status.   Therapy/Group: Individual Therapy  Waunita Schooner 08/15/2017, 2:18 PM

## 2017-08-15 NOTE — Progress Notes (Signed)
Speech Language Pathology Daily Session Note  Patient Details  Name: Sara Russell MRN: [id number] Date of Birth: 26-Aug-1930  Today's Date: 08/15/2017 SLP Individual Time: 0900-1000 SLP Individual Time Calculation (min): 60 min  Short Term Goals: Week 2: SLP Short Term Goal 1 (Week 2): Pt will recognize and correct verbal errors at the word level with min assist verbal cues.   SLP Short Term Goal 2 (Week 2): Pt will demonstrate selective attention in mildly distracting environment for ~ 15 minutes with Mod A cues.  SLP Short Term Goal 3 (Week 2): Pt will complete basic familiar tasks with supervision cues.  SLP Short Term Goal 4 (Week 2): Pt will scan to right of midline to locate objects in 8 of 10 opportunities with Supervision cues.  SLP Short Term Goal 5 (Week 2): Pt will verbalize at the phrase level during nonstructured tasks with mod assist multimodal cues.    Skilled Therapeutic Interventions: Skilled treatment session focused on cognition goals. SLP facilitated session by providing Mod A for completing basic color block task. Pt required Mod A cues for selective attention in mildly distracting environment for ~ 45 minutes. Despite Mod A cues, pt unable to sort coins. Pt with inconsistent yes/no related to coins but was able to spontaneously name them. Pt was returned to room, left upright in wheelchair with safety belt on and all needs within reach. Continue per current plan of care.      Function:  Eating Eating   Modified Consistency Diet: No Eating Assist Level: Set up assist for;More than reasonable amount of time   Eating Set Up Assist For: Opening containers;Cutting food Helper Scoops Food on Utensil: Occasionally     Cognition Comprehension Comprehension assist level: Understands basic 25 - 49% of the time/ requires cueing 50 - 75% of the time  Expression   Expression assist level: Expresses basic 25 - 49% of the time/requires cueing 50 - 75% of the time. Uses single  words/gestures.  Social Interaction Social Interaction assist level: Interacts appropriately 50 - 74% of the time - May be physically or verbally inappropriate.  Problem Solving Problem solving assist level: Solves basic 25 - 49% of the time - needs direction more than half the time to initiate, plan or complete simple activities  Memory Memory assist level: Recognizes or recalls less than 25% of the time/requires cueing greater than 75% of the time    Pain Pain Assessment Faces Pain Scale: No hurt  Therapy/Group: Individual Therapy  Tish Begin 08/15/2017, 11:30 AM

## 2017-08-15 NOTE — Progress Notes (Addendum)
[facility] PHYSICAL MEDICINE & REHABILITATION     PROGRESS NOTE    Subjective/Complaints:  No issues overnite  ROS: limited due to cognition  Objective:  No results found. Recent Labs    08/15/17 0652  WBC 8.2  HGB 11.4*  HCT 35.3*  PLT 301   No results for input(s): NA, K, CL, GLUCOSE, BUN, CREATININE, CALCIUM in the last 72 hours.  Invalid input(s): CO CBG (last 3)  No results for input(s): GLUCAP in the last 72 hours.  Wt Readings from Last 3 Encounters:  08/15/17 66.5 kg (146 lb 9.7 oz)  08/02/17 70.1 kg (154 lb 8.7 oz)  05/05/17 70.3 kg (155 lb)     Intake/Output Summary (Last 24 hours) at 08/15/2017 0739 Last data filed at 08/14/2017 1806 Gross per 24 hour  Intake 480 ml  Output -  Net 480 ml    Vital Signs: Blood pressure (!) 145/71, pulse 64, temperature 98.8 F (37.1 C), temperature source Oral, resp. rate 18, weight 66.5 kg (146 lb 9.7 oz), SpO2 97 %. Physical Exam:  Constitutional: No distress . Vital signs reviewed. HENT: Normocephalic.  Atraumatic. Eyes: EOMI. No discharge. Cardiovascular: RRR. No JVD. Respiratory: CTA Bilaterally. Normal effort. GI: BS +. Non-distended. Musc: No edema or tenderness in extremities. Neurological: She is alert  Global aphasia Right central 7 Dysarthria Motor RUE 3-/5 proximal to distal RLE: 3+ to 4-/5 HF, 4/5 distally LUE and LLE 4 to 4+/5. Apraxic Skin: Skin is warm and dry.  Psychiatric: pleasant and cooperative  Assessment/Plan: 1. Functional deficits secondary to left thalamic/internal capsule hemorrhage which require 3+ hours per day of interdisciplinary therapy in a comprehensive inpatient rehab setting. Physiatrist is providing close team supervision and 24 hour management of active medical problems listed below. Physiatrist and rehab team continue to assess barriers to discharge/monitor patient progress toward functional and medical goals.  Function:  Bathing Bathing position   Position:  Wheelchair/chair at sink  Bathing parts Body parts bathed by patient: Right arm, Chest, Abdomen, Right upper leg, Left upper leg Body parts bathed by helper: Left arm, Front perineal area, Buttocks, Right lower leg, Left lower leg, Back  Bathing assist Assist Level: (Mod assist)      Upper Body Dressing/Undressing Upper body dressing   What is the patient wearing?: Pull over shirt/dress     Pull over shirt/dress - Perfomed by patient: Thread/unthread left sleeve, Put head through opening, Pull shirt over trunk Pull over shirt/dress - Perfomed by helper: Thread/unthread right sleeve        Upper body assist Assist Level: Touching or steadying assistance(Pt > 75%)      Lower Body Dressing/Undressing Lower body dressing   What is the patient wearing?: Pants, Non-skid slipper socks       Pants- Performed by helper: Thread/unthread right pants leg, Thread/unthread left pants leg, Pull pants up/down   Non-skid slipper socks- Performed by helper: Don/doff right sock, Don/doff left sock                  Lower body assist Assist for lower body dressing: (Total assist)      Toileting Toileting Toileting activity did not occur: No continent bowel/bladder event   Toileting steps completed by helper: Adjust clothing prior to toileting, Performs perineal hygiene, Adjust clothing after toileting    Toileting assist Assist level: Two helpers   Transfers Chair/bed transfer   Chair/bed transfer method: Stand pivot Chair/bed transfer assist level: Moderate assist (Pt 50 - 74%/lift or lower) Chair/bed  transfer assistive device: Armrests, Environmental manager lift: Architectural technologist activity did not occur: (P) Safety/medical concerns   Max distance: 59f Assist level: Moderate assist (Pt 50 - 74%)   Wheelchair   Type: Manual Max wheelchair distance: 100 ft Assist Level: Moderate assistance (Pt 50 - 74%)  Cognition Comprehension Comprehension assist  level: Understands basic 75 - 89% of the time/ requires cueing 10 - 24% of the time  Expression Expression assist level: Expresses basic 25 - 49% of the time/requires cueing 50 - 75% of the time. Uses single words/gestures.  Social Interaction Social Interaction assist level: Interacts appropriately 50 - 74% of the time - May be physically or verbally inappropriate.  Problem Solving Problem solving assist level: Solves basic 25 - 49% of the time - needs direction more than half the time to initiate, plan or complete simple activities  Memory Memory assist level: Recognizes or recalls less than 25% of the time/requires cueing greater than 75% of the time   Medical Problem List and Plan: 1.  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  DVT Prophylaxis/Anticoagulation: SCDs. 3. Pain Management: Tylenol as needed 4. Mood: Provide emotional support 5. Neuropsych: This patient is not capable of making decisions on her own behalf. 6. Skin/Wound Care: Routine skin checks 7. Fluids/Electrolytes/Nutrition: encourage PO intake  -hypokalemia: resolved   -kdur to 263m bid   -recheck 7/1 was 4.4 8.  Atrial fibrillation.  Amiodarone 100 mg daily.  Cardiac rate controlled.  Eliquis on hold due to  hemorrhage 9.  Hypertension.  Coreg 12.5 mg twice daily, increased coreg to 18.7540mid   Vitals:   08/14/17 1845 08/15/17 0539  BP: (!) 138/56 (!) 145/71  Pulse: 60 64  Resp:  18  Temp:    SpO2:  97%  Controlled 7/5 10.  Chronic diastolic congestive heart failure.  Monitor for signs of fluid overload Filed Weights   08/13/17 0502 08/14/17 0428 08/15/17 0539  Weight: 70.4 kg (155 lb 3.3 oz) 72.4 kg (159 lb 9.8 oz) 66.5 kg (146 lb 9.7 oz)   Stable on  7/5 11.  Hypothyroidism.  Synthroid 12.  Renal insufficiency.  Baseline creatinine 1.39.  Creatinine 1.04 on 6/28, 1.21 on 7/1, recheck in am  Continue to monitor, Low intake I 480m12m 7/4 13.  Hyperlipidemia.  Lipitor 14. Acute blood loss anemia  Hemoglobin 11.2 on 6/27, 11.9 upon admit to acute, stable at 11.4  Continue to monitor  LOS (Days) 9 [place]  AndrCharlett Blake 08/15/2017 7:39 AM

## 2017-08-15 NOTE — Plan of Care (Signed)
  Problem: Consults Goal: RH STROKE PATIENT EDUCATION Description See Patient Education module for education specifics  Outcome: Progressing   Problem: RH BOWEL ELIMINATION Goal: RH STG MANAGE BOWEL WITH ASSISTANCE Description STG Manage Bowel with Mod. Assistance.  Outcome: Progressing   Problem: RH BLADDER ELIMINATION Goal: RH STG MANAGE BLADDER WITH ASSISTANCE Description STG Manage Bladder With Mod. Assistance  Outcome: Progressing   Problem: RH SKIN INTEGRITY Goal: RH STG MAINTAIN SKIN INTEGRITY WITH ASSISTANCE Description STG Maintain Skin Integrity With Mod Assistance.  Outcome: Progressing   Problem: RH SAFETY Goal: RH STG ADHERE TO SAFETY PRECAUTIONS W/ASSISTANCE/DEVICE Description STG Adhere to Safety Precautions With Mod Assistance/Device.  Outcome: Progressing Goal: RH STG DECREASED RISK OF FALL WITH ASSISTANCE Description STG Decreased Risk of Fall With Mod Assistance.  Outcome: Progressing   Problem: RH COGNITION-NURSING Goal: RH STG USES MEMORY AIDS/STRATEGIES W/ASSIST TO PROBLEM SOLVE Description STG Uses Memory Aids/Strategies With Mod Assistance to Problem Solve.  Outcome: Progressing   Problem: RH PAIN MANAGEMENT Goal: RH STG PAIN MANAGED AT OR BELOW PT'S PAIN GOAL Description Less than 3,on 1 to 10 scale  Outcome: Progressing

## 2017-08-15 NOTE — Progress Notes (Signed)
Pt presented increased drowsiness, unable to keep eyes open. BP was noted at 98/68. Linna Hoff, [place] notified.

## 2017-08-16 ENCOUNTER — Inpatient Hospital Stay (HOSPITAL_COMMUNITY): Payer: No Typology Code available for payment source

## 2017-08-16 ENCOUNTER — Inpatient Hospital Stay (HOSPITAL_COMMUNITY): Payer: No Typology Code available for payment source | Admitting: Occupational Therapy

## 2017-08-16 ENCOUNTER — Inpatient Hospital Stay (HOSPITAL_COMMUNITY): Payer: No Typology Code available for payment source | Admitting: Speech Pathology

## 2017-08-16 LAB — URINALYSIS, ROUTINE W REFLEX MICROSCOPIC
Bilirubin Urine: NEGATIVE
Glucose, UA: NEGATIVE mg/dL
Ketones, ur: NEGATIVE mg/dL
Nitrite: POSITIVE — AB
Protein, ur: 100 mg/dL — AB
Specific Gravity, Urine: 1.01 (ref 1.005–1.030)
pH: 8 (ref 5.0–8.0)

## 2017-08-16 LAB — URINALYSIS, MICROSCOPIC (REFLEX): Squamous Epithelial / LPF: NONE SEEN (ref 0–5)

## 2017-08-16 MED ORDER — NITROFURANTOIN MACROCRYSTAL 50 MG PO CAPS
50.0000 mg | ORAL_CAPSULE | Freq: Three times a day (TID) | ORAL | Status: DC
  Administered 2017-08-16: 50 mg via ORAL
  Filled 2017-08-16 (×3): qty 1

## 2017-08-16 MED ORDER — METHOCARBAMOL 500 MG PO TABS
500.0000 mg | ORAL_TABLET | Freq: Once | ORAL | Status: AC
  Administered 2017-08-16: 500 mg via ORAL
  Filled 2017-08-16: qty 1

## 2017-08-16 MED ORDER — HYPROMELLOSE (GONIOSCOPIC) 2.5 % OP SOLN
1.0000 [drp] | OPHTHALMIC | Status: DC | PRN
  Administered 2017-08-16: 1 [drp] via OPHTHALMIC
  Filled 2017-08-16: qty 15

## 2017-08-16 NOTE — Progress Notes (Signed)
Patient was incontinent this am, bladder scanned for 417 and intermittent straight cathed for 400 cc. Urine was cloudy and malodorous.

## 2017-08-16 NOTE — Progress Notes (Signed)
Urine sent to lab for culture.  Sara Russell

## 2017-08-16 NOTE — Plan of Care (Signed)
  Problem: Consults Goal: RH STROKE PATIENT EDUCATION Description See Patient Education module for education specifics  Outcome: Progressing   Problem: RH BOWEL ELIMINATION Goal: RH STG MANAGE BOWEL WITH ASSISTANCE Description STG Manage Bowel with Mod. Assistance.  Outcome: Progressing   Problem: RH BLADDER ELIMINATION Goal: RH STG MANAGE BLADDER WITH ASSISTANCE Description STG Manage Bladder With Mod. Assistance  Outcome: Progressing   Problem: RH SKIN INTEGRITY Goal: RH STG MAINTAIN SKIN INTEGRITY WITH ASSISTANCE Description STG Maintain Skin Integrity With Mod Assistance.  Outcome: Progressing   Problem: RH SAFETY Goal: RH STG ADHERE TO SAFETY PRECAUTIONS W/ASSISTANCE/DEVICE Description STG Adhere to Safety Precautions With Mod Assistance/Device.  Outcome: Progressing Goal: RH STG DECREASED RISK OF FALL WITH ASSISTANCE Description STG Decreased Risk of Fall With Mod Assistance.  Outcome: Progressing   Problem: RH COGNITION-NURSING Goal: RH STG USES MEMORY AIDS/STRATEGIES W/ASSIST TO PROBLEM SOLVE Description STG Uses Memory Aids/Strategies With Mod Assistance to Problem Solve.  Outcome: Progressing   Problem: RH PAIN MANAGEMENT Goal: RH STG PAIN MANAGED AT OR BELOW PT'S PAIN GOAL Description Less than 3,on 1 to 10 scale  Outcome: Progressing

## 2017-08-16 NOTE — Progress Notes (Signed)
Occupational Therapy Session Note  Patient Details  Name: Sara Russell MRN: [id number] Date of Birth: 1930-07-16  Today's Date: 08/16/2017 OT Individual Time: 1290-4753 OT Individual Time Calculation (min): 28 min   Skilled Therapeutic Interventions/Progress Updates:    Pt greeted in w/c with son Sara Russell present. No s/s pain. Worked on [employer] and Rt attention via linen folding in [place]. Pt able to bilaterally integrate UEs to meet FM task demands. Mod vcs for increasing R UE use with pillowcases and wash cloths. Min vcs for large towels. Pt appeared to experience flow during this familiar IADL task. Educated son that he could assist pt with NMR outside of therapies by having her fold some of her personal clothing items. At end of session Sara Russell escorted pt back to room.   Therapy Documentation Precautions:  Precautions Precautions: Fall Precaution Comments: watch BP / visual deficits; hx of spinal compression fxs (osteoporosis); L ankle fixed 2/2 ORIF 15 years ago Restrictions Weight Bearing Restrictions: No ADL: ADL Eating: Not assessed Grooming: Not assessed Toileting: Not assessed Toilet Transfer: Not assessed Toilet Transfer Method: Not assessed Tub/Shower Transfer: Not assessed Walk-In Shower Transfer: Not assessed ADL Comments: see functional nav. ADLs on eval limited d/t pts ability and level of function     See Function Navigator for Current Functional Status.   Therapy/Group: Individual Therapy  Abbigal Radich A Halden Phegley 08/16/2017, 3:49 PM

## 2017-08-16 NOTE — Progress Notes (Signed)
Sara Russell is a 84 y.o. female 08/04/1930 [id number]  Subjective:  No new problems. Slept well. Feeling OK.  Objective: Vital signs in last 24 hours: Temp:  [98 F (36.7 C)-99.1 F (37.3 C)] 98 F (36.7 C) (07/06 0933) Pulse Rate:  [57-70] 57 (07/06 0933) Resp:  [18] 18 (07/06 0933) BP: (114-151)/(56-73) 133/73 (07/06 0933) SpO2:  [95 %-98 %] 97 % (07/06 0933) Weight:  [154 lb 12.2 oz (70.2 kg)] 154 lb 12.2 oz (70.2 kg) (07/06 0516) Weight change: 8 lb 2.5 oz (3.7 kg) Last BM Date: 08/15/17  Intake/Output from previous day: 07/05 0701 - 07/06 0700 In: 420 [P.O.:420] Out: 875 [Urine:875] Last cbgs: CBG (last 3)  No results for input(s): GLUCAP in the last 72 hours.   Physical Exam General: No apparent distress   HEENT: not dry Lungs: Normal effort. Lungs clear to auscultation, no crackles or wheezes. Cardiovascular: Regular rate and rhythm, no edema Abdomen: S/NT/ND; BS(+) Musculoskeletal:  unchanged Neurological: No new neurological deficits Wounds: N/A    Skin: clear  Aging changes Mental state: Alert, cooperative.  Aphasic    Lab Results: BMET    Component Value Date/Time   NA 135 08/15/2017 0652   NA 135 02/12/2017 1134   K 4.3 08/15/2017 0652   CL 102 08/15/2017 0652   CO2 25 08/15/2017 0652   GLUCOSE 102 (H) 08/15/2017 0652   BUN 26 (H) 08/15/2017 0652   BUN 14 02/12/2017 1134   CREATININE 1.30 (H) 08/15/2017 0652   CREATININE 1.39 (H) 03/21/2015 0929   CALCIUM 8.8 (L) 08/15/2017 0652   GFRNONAA 36 (L) 08/15/2017 0652   GFRNONAA 54 (L) 05/18/2012 0839   GFRAA 42 (L) 08/15/2017 0652   GFRAA 63 05/18/2012 0839   CBC    Component Value Date/Time   WBC 8.2 08/15/2017 0652   RBC 3.78 (L) 08/15/2017 0652   HGB 11.4 (L) 08/15/2017 0652   HGB 12.1 06/27/2016 0943   HCT 35.3 (L) 08/15/2017 0652   HCT 35.9 06/27/2016 0943   PLT 301 08/15/2017 0652   PLT 240 06/27/2016 0943   MCV 93.4 08/15/2017 0652   MCV 95 06/27/2016 0943   MCH 30.2  08/15/2017 0652   MCHC 32.3 08/15/2017 0652   RDW 12.6 08/15/2017 0652   RDW 14.2 06/27/2016 0943   LYMPHSABS 0.7 08/07/2017 0547   MONOABS 0.8 08/07/2017 0547   EOSABS 0.1 08/07/2017 0547   BASOSABS 0.1 08/07/2017 0547    Studies/Results: No results found.  Medications: I have reviewed the patient's current medications.  Assessment/Plan:  1.  Right-sided weakness with aphasia secondary to left thalamic/internal capsule hemorrhage and subcortical infarcts in the left frontal and parietal-occipital lobes secondary to uncontrolled hypertension and anticoagulation.  CIR 2.  DVT prophylaxis with SCD 3.  Pain management with Tylenol PRN 4.  Atrial fibrillation.  On amiodarone p.o.  Eliquis is on hold due to hemorrhage 5.  Hypertension.  Coreg twice a day 6.  Congestive heart failure, chronic 7.  Hypothyroidism.  On Synthroid 8.  Renal insufficiency with a baseline creatinine of 1.39.  Monitor labs 9.  Hyperlipidemia on Lipitor Acute blood loss anemia-will monitor hemoglobin       Length of stay, days: [place] , MD 08/16/2017, 12:35 PM

## 2017-08-17 ENCOUNTER — Inpatient Hospital Stay (HOSPITAL_COMMUNITY): Payer: Medicare Other

## 2017-08-17 ENCOUNTER — Inpatient Hospital Stay (HOSPITAL_COMMUNITY): Payer: Medicare Other | Admitting: Physical Therapy

## 2017-08-17 ENCOUNTER — Inpatient Hospital Stay (HOSPITAL_COMMUNITY): Payer: No Typology Code available for payment source | Admitting: Occupational Therapy

## 2017-08-17 DIAGNOSIS — R609 Edema, unspecified: Secondary | ICD-10-CM

## 2017-08-17 DIAGNOSIS — M79609 Pain in unspecified limb: Secondary | ICD-10-CM

## 2017-08-17 MED ORDER — METHOCARBAMOL 500 MG PO TABS
500.0000 mg | ORAL_TABLET | Freq: Two times a day (BID) | ORAL | Status: DC
  Administered 2017-08-17: 500 mg via ORAL
  Filled 2017-08-17: qty 1

## 2017-08-17 MED ORDER — CEPHALEXIN 250 MG PO CAPS
250.0000 mg | ORAL_CAPSULE | Freq: Three times a day (TID) | ORAL | Status: AC
  Administered 2017-08-17 – 2017-08-23 (×21): 250 mg via ORAL
  Filled 2017-08-17 (×21): qty 1

## 2017-08-17 MED ORDER — METHOCARBAMOL 500 MG PO TABS
500.0000 mg | ORAL_TABLET | Freq: Once | ORAL | Status: AC
  Administered 2017-08-17: 500 mg via ORAL
  Filled 2017-08-17: qty 1

## 2017-08-17 MED ORDER — METHOCARBAMOL 500 MG PO TABS
500.0000 mg | ORAL_TABLET | Freq: Two times a day (BID) | ORAL | Status: DC | PRN
  Administered 2017-08-18: 500 mg via ORAL
  Filled 2017-08-17: qty 1

## 2017-08-17 NOTE — Progress Notes (Signed)
Pt returned to unit.  Sara Russell

## 2017-08-17 NOTE — Progress Notes (Addendum)
Pt transported to vascular lab to get bilateral doppler. No complaint of pain voiced this morning. Pt stated sleeping well last night.  Sara Russell

## 2017-08-17 NOTE — Progress Notes (Signed)
Occupational Therapy Session Note  Patient Details  Name: Sara Russell MRN: [id number] Date of Birth: 02-13-1930  Today's Date: 08/17/2017 OT Missed Time: 99 Minutes Missed Time Reason: MD hold (comment)(B LE dopplers ordered)   Skilled Therapeutic Interventions/Progress Updates:    Pt seen for scheduled therapy and MD reported that she is on medical hold for today. 60 minutes missed. Will make up therapy time when medically appropriate.   Therapy Documentation Precautions:  Precautions Precautions: Fall Precaution Comments: watch BP / visual deficits; hx of spinal compression fxs (osteoporosis); L ankle fixed 2/2 ORIF 15 years ago Restrictions Weight Bearing Restrictions: No General: General OT Amount of Missed Time: 16 Minutes PT Missed Treatment Reason: MD hold (Comment)(R LE pain, B LE dopplers ordered)  Pain: Pain Assessment Faces Pain Scale: No hurt ADL: ADL Eating: Not assessed Grooming: Not assessed Toileting: Not assessed Toilet Transfer: Not assessed Toilet Transfer Method: Not assessed Tub/Shower Transfer: Not assessed Walk-In Shower Transfer: Not assessed ADL Comments: see functional nav. ADLs on eval limited d/t pts ability and level of function     See Function Navigator for Current Functional Status.   Therapy/Group: Individual Therapy  [name] A [name] 08/17/2017, 12:51 PM

## 2017-08-17 NOTE — Plan of Care (Signed)
  Problem: Consults Goal: RH STROKE PATIENT EDUCATION Description See Patient Education module for education specifics  Outcome: Progressing   Problem: RH SKIN INTEGRITY Goal: RH STG MAINTAIN SKIN INTEGRITY WITH ASSISTANCE Description STG Maintain Skin Integrity With Mod Assistance.  Outcome: Progressing   Problem: RH SAFETY Goal: RH STG ADHERE TO SAFETY PRECAUTIONS W/ASSISTANCE/DEVICE Description STG Adhere to Safety Precautions With Mod Assistance/Device.  Outcome: Progressing Goal: RH STG DECREASED RISK OF FALL WITH ASSISTANCE Description STG Decreased Risk of Fall With Mod Assistance.  Outcome: Progressing   Problem: RH COGNITION-NURSING Goal: RH STG USES MEMORY AIDS/STRATEGIES W/ASSIST TO PROBLEM SOLVE Description STG Uses Memory Aids/Strategies With Mod Assistance to Problem Solve.  Outcome: Progressing   Problem: RH PAIN MANAGEMENT Goal: RH STG PAIN MANAGED AT OR BELOW PT'S PAIN GOAL Description Less than 3,on 1 to 10 scale  Outcome: Progressing   Problem: RH BOWEL ELIMINATION Goal: RH STG MANAGE BOWEL WITH ASSISTANCE Description STG Manage Bowel with Mod. Assistance.  Outcome: Not Progressing   Problem: RH BLADDER ELIMINATION Goal: RH STG MANAGE BLADDER WITH ASSISTANCE Description STG Manage Bladder With Mod. Assistance  Outcome: Not Progressing

## 2017-08-17 NOTE — Progress Notes (Signed)
Bilateral lower extremity venous duplex completed. There is no evidence of DVT or Baker's cyst. Toma Copier, RV[facility] 08/17/2017 11:16

## 2017-08-17 NOTE — Progress Notes (Signed)
Slept from 2230-0315. No void 8 hours, I & O cath=600cc's, + sediment. C/O RLE pain, PRN tylenol given at 0348. Continued to complain of leg pain, grimacing and constantly moving legs. Paged Dr. Burnice Logan, orders for one time dose robaxin, given at 0615 and BLE dopplers ordered. Sara Russell A

## 2017-08-17 NOTE — Progress Notes (Signed)
Sara Russell is a 84 y.o. female 11/14/1930 [id number]  Subjective: The patient developed severe right lower leg pain last night.  Dr. Raliegh Ip ordered a venous Doppler ultrasound.  UA was suspicious for UTI.  Preliminary urine culture revealed gram-negative rods.  Dr. Raliegh Ip started the patient on Keflex p.o.  Objective: Vital signs in last 24 hours: Temp:  [97.7 F (36.5 C)-98.7 F (37.1 C)] 97.7 F (36.5 C) (07/07 0324) Pulse Rate:  [64-84] 64 (07/07 0324) Resp:  [16-18] 16 (07/07 0324) BP: (129-179)/(63-86) 149/63 (07/07 0324) SpO2:  [97 %-99 %] 99 % (07/07 0324) Weight:  [147 lb 11.3 oz (67 kg)] 147 lb 11.3 oz (67 kg) (07/07 0324) Weight change: -7 lb 0.9 oz (-3.2 kg) Last BM Date: 08/16/17(small per previous report)  Intake/Output from previous day: 07/06 0701 - 07/07 0700 In: 700 [P.O.:700] Out: 1250 [Urine:1250] Last cbgs: CBG (last 3)  No results for input(s): GLUCAP in the last 72 hours.   Physical Exam General: No apparent distress -sleeping after her pain pill.  Her son Annie Main is at bedside. HEENT: not dry Lungs: Normal effort. Lungs clear to auscultation, no crackles or wheezes. Cardiovascular: Regular rate and rhythm, no edema Abdomen: S/NT/ND; BS(+) Musculoskeletal:  unchanged.  Both legs are without edema.  She is moaning a little bit when her right lower leg/ankle is palpated.  No discoloration, temperature elevation, redness etc. Neurological: No new neurological deficits Wounds: N/A    Skin: clear  Aging changes Mental state: Asleep.    Lab Results: BMET    Component Value Date/Time   NA 135 08/15/2017 0652   NA 135 02/12/2017 1134   K 4.3 08/15/2017 0652   CL 102 08/15/2017 0652   CO2 25 08/15/2017 0652   GLUCOSE 102 (H) 08/15/2017 0652   BUN 26 (H) 08/15/2017 0652   BUN 14 02/12/2017 1134   CREATININE 1.30 (H) 08/15/2017 0652   CREATININE 1.39 (H) 03/21/2015 0929   CALCIUM 8.8 (L) 08/15/2017 0652   GFRNONAA 36 (L) 08/15/2017 0652   GFRNONAA 54  (L) 05/18/2012 0839   GFRAA 42 (L) 08/15/2017 0652   GFRAA 63 05/18/2012 0839   CBC    Component Value Date/Time   WBC 8.2 08/15/2017 0652   RBC 3.78 (L) 08/15/2017 0652   HGB 11.4 (L) 08/15/2017 0652   HGB 12.1 06/27/2016 0943   HCT 35.3 (L) 08/15/2017 0652   HCT 35.9 06/27/2016 0943   PLT 301 08/15/2017 0652   PLT 240 06/27/2016 0943   MCV 93.4 08/15/2017 0652   MCV 95 06/27/2016 0943   MCH 30.2 08/15/2017 0652   MCHC 32.3 08/15/2017 0652   RDW 12.6 08/15/2017 0652   RDW 14.2 06/27/2016 0943   LYMPHSABS 0.7 08/07/2017 0547   MONOABS 0.8 08/07/2017 0547   EOSABS 0.1 08/07/2017 0547   BASOSABS 0.1 08/07/2017 0547    Studies/Results: No results found.  Medications: I have reviewed the patient's current medications.  Assessment/Plan:    1.  Right-sided weakness with aphasia secondary to left thalamic/internal capsule hemorrhage and subcortical infarcts in the left frontal and parietal-occipital lobes secondary to uncontrolled hypertension and anticoagulation.  CIR 2.  DVT prophylaxis with SCD 3.  Pain management with Tylenol PRN 4.  Atrial fibrillation.  On amiodarone p.o.  Eliquis is on hold due to hemorrhage 5.  Hypertension.  Coreg twice daily 6.  Congestive heart failure, chronic.  Seems to be compensated 7.  Hypothyroidism-on Synthroid 8.  Renal insufficiency with a baseline creatinine of 1.39.  Continue to monitor 9.  Hyperlipidemia-on Lipitor 10.  Acute blood loss anemia-we will monitor hemoglobins 11.  UTI.  Preliminary culture with gram-negative rods.  The patient received Keflex this morning.  Otherwise will await cultures and sensitivity.  Monitor for fevers 12.  Acute right lower leg pain.  Doppler ultrasound is pending.  The patient is off Xarelto due to cerebral hemorrhage.  Will hold off PT and other therapy for today       Length of stay, days: [place] , MD 08/17/2017, 11:01 AM

## 2017-08-17 NOTE — Progress Notes (Signed)
Physical Therapy Note  Patient Details  Name: Sara Russell MRN: [id number] Date of Birth: [birth year] Today's Date: 08/17/2017    Pt c/o R LE pain early am, MD ordered B LEs dopplers, hold therapy as per MD.   Dub Amis 08/17/2017, 12:45 PM

## 2017-08-17 NOTE — Progress Notes (Signed)
Pt continues to have spasm like pain on right lower extremity. Strong contractions of right calf muscles observed. Dr. Alain Marion made aware. Order obtained for Robaxin PRN BID. Heating pad applied, robaxin given.  Maripat Borba W Adanya Sosinski

## 2017-08-17 NOTE — Progress Notes (Addendum)
Left conjunctiva red, no drainage noted. Left eyelid with redness and small amount of edema. Paged Dr. Inda Merlin, PRN artificial tears ordered and given. Son at bedside and at 2115 reports patient grimacing,  legs with intermittent spasm like movement. Patient with expressive aphasia, so difficult to assess exact location of pain. LLE cooler to touch, 2 + pedal pulses bilaterally. RLE with non pitting edema.  PRN tylenol given at 2015, along with heat packs to legs, no relief. Paged Dr. Inda Merlin, one time order for robaxin, patient currently sleeping.Sara Russell

## 2017-08-18 ENCOUNTER — Inpatient Hospital Stay (HOSPITAL_COMMUNITY): Payer: No Typology Code available for payment source

## 2017-08-18 ENCOUNTER — Inpatient Hospital Stay (HOSPITAL_COMMUNITY): Payer: Medicare Other | Admitting: Physical Therapy

## 2017-08-18 LAB — URINE CULTURE: Culture: >=100000 — AB

## 2017-08-18 MED ORDER — TIZANIDINE HCL 2 MG PO TABS
2.0000 mg | ORAL_TABLET | Freq: Three times a day (TID) | ORAL | Status: DC
  Administered 2017-08-18 – 2017-08-19 (×4): 2 mg via ORAL
  Filled 2017-08-18 (×4): qty 1

## 2017-08-18 MED ORDER — PRO-STAT SUGAR FREE PO LIQD
30.0000 mL | Freq: Every day | ORAL | Status: DC
  Administered 2017-08-18 – 2017-08-25 (×8): 30 mL via ORAL
  Filled 2017-08-18 (×9): qty 30

## 2017-08-18 NOTE — Progress Notes (Addendum)
[place] PHYSICAL MEDICINE & REHABILITATION     PROGRESS NOTE    Subjective/Complaints:  Pt had spasms in Right calf, DVU negative, partial relief with methocarbamol but has sedation  ROS: limited due to cognition/aphasia  Objective:  No results found. No results for input(s): WBC, HGB, HCT, PLT in the last 72 hours. No results for input(s): NA, K, CL, GLUCOSE, BUN, CREATININE, CALCIUM in the last 72 hours.  Invalid input(s): CO CBG (last 3)  No results for input(s): GLUCAP in the last 72 hours.  Wt Readings from Last 3 Encounters:  08/18/17 69.5 kg (153 lb 3.5 oz)  08/02/17 70.1 kg (154 lb 8.7 oz)  05/05/17 70.3 kg (155 lb)     Intake/Output Summary (Last 24 hours) at 08/18/2017 0802 Last data filed at 08/18/2017 0601 Gross per 24 hour  Intake 860 ml  Output 2740 ml  Net -1880 ml    Vital Signs: Blood pressure 121/66, pulse (!) 54, temperature 97.6 F (36.4 C), temperature source Oral, resp. rate 16, weight 69.5 kg (153 lb 3.5 oz), SpO2 97 %. Physical Exam:  Constitutional: No distress . Vital signs reviewed. HENT: Normocephalic.  Atraumatic. Eyes: EOMI. No discharge. Cardiovascular: RRR. No JVD. Respiratory: CTA Bilaterally. Normal effort. GI: BS +. Non-distended. Musc: No edema or tenderness in extremities. Neurological: She is alert  Global aphasia Right central 7 Dysarthria Motor RUE 3-/5 proximal to distal RLE: 3+ to 4-/5 HF, 4/5 distally LUE and LLE 4 to 4+/5. Apraxic Skin: Skin is warm and dry.  Psychiatric: pleasant and cooperative  Assessment/Plan: 1. Functional deficits secondary to left thalamic/internal capsule hemorrhage which require 3+ hours per day of interdisciplinary therapy in a comprehensive inpatient rehab setting. Physiatrist is providing close team supervision and 24 hour management of active medical problems listed below. Physiatrist and rehab team continue to assess barriers to discharge/monitor patient progress toward functional  and medical goals.  Function:  Bathing Bathing position   Position: Wheelchair/chair at sink  Bathing parts Body parts bathed by patient: Right arm, Chest, Abdomen, Right upper leg, Left upper leg Body parts bathed by helper: Left arm, Front perineal area, Buttocks, Right lower leg, Left lower leg, Back  Bathing assist Assist Level: (Mod assist)      Upper Body Dressing/Undressing Upper body dressing   What is the patient wearing?: Pull over shirt/dress     Pull over shirt/dress - Perfomed by patient: Thread/unthread left sleeve, Put head through opening, Pull shirt over trunk Pull over shirt/dress - Perfomed by helper: Thread/unthread right sleeve        Upper body assist Assist Level: Touching or steadying assistance(Pt > 75%)      Lower Body Dressing/Undressing Lower body dressing   What is the patient wearing?: Pants, Non-skid slipper socks       Pants- Performed by helper: Thread/unthread right pants leg, Thread/unthread left pants leg, Pull pants up/down   Non-skid slipper socks- Performed by helper: Don/doff right sock, Don/doff left sock                  Lower body assist Assist for lower body dressing: (Total assist)      Toileting Toileting Toileting activity did not occur: No continent bowel/bladder event   Toileting steps completed by helper: Adjust clothing prior to toileting, Performs perineal hygiene, Adjust clothing after toileting Toileting Assistive Devices: Other (comment)(stedy)  Toileting assist Assist level: More than reasonable time   Transfers Chair/bed transfer   Chair/bed transfer method: Other(stedy used) Chair/bed transfer assist  level: Moderate assist (Pt 50 - 74%/lift or lower) Chair/bed transfer assistive device: Armrests Mechanical lift: Stedy   Locomotion Ambulation Ambulation activity did not occur: (P) Safety/medical concerns   Max distance: 22f Assist level: Moderate assist (Pt 50 - 74%)   Wheelchair   Type:  Manual Max wheelchair distance: 100 ft Assist Level: Moderate assistance (Pt 50 - 74%)  Cognition Comprehension Comprehension assist level: Understands basic 25 - 49% of the time/ requires cueing 50 - 75% of the time  Expression Expression assist level: Expresses basis less than 25% of the time/requires cueing >75% of the time.  Social Interaction Social Interaction assist level: Interacts appropriately 25 - 49% of time - Needs frequent redirection.  Problem Solving Problem solving assist level: Solves basic 25 - 49% of the time - needs direction more than half the time to initiate, plan or complete simple activities  Memory Memory assist level: (unable to assess R/T expressive aphasia)   Medical Problem List and Plan: 1.  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  DVT Prophylaxis/Anticoagulation: SCDs. 3. Pain Management: Tylenol as needed 4. Mood: Provide emotional support 5. Neuropsych: This patient is not capable of making decisions on her own behalf. 6. Skin/Wound Care: Routine skin checks 7. Fluids/Electrolytes/Nutrition: encourage PO intake  -hypokalemia: resolved   -kdur to 246m bid   -recheck 7/1 was 4.4 8.  Atrial fibrillation.  Amiodarone 100 mg daily.  Cardiac rate controlled.  Eliquis on hold due to  hemorrhage 9.  Hypertension.  Coreg 12.5 mg twice daily, increased coreg to 18.7523mid   Vitals:   08/18/17 0330 08/18/17 0756  BP: (!) 161/95 121/66  Pulse: 62 (!) 54  Resp: 17 16  Temp: 97.6 F (36.4 C)   SpO2: 97% 97%  Controlled 7/5 10.  Chronic diastolic congestive heart failure.  Monitor for signs of fluid overload Filed Weights   08/16/17 0516 08/17/17 0324 08/18/17 0330  Weight: 70.2 kg (154 lb 12.2 oz) 67 kg  (147 lb 11.3 oz) 69.5 kg (153 lb 3.5 oz)   Stable on 7/5 11.  Hypothyroidism.  Synthroid 12.  Renal insufficiency.  Baseline creatinine 1.39.  Creatinine 1.04 on 6/28, 1.21 on 7/1, recheck in am  Continue to monitor, Low intake I 480m79m 7/4 13.  Hyperlipidemia.  Lipitor 14. Acute blood loss anemia  Hemoglobin 11.2 on 6/27, 11.9 upon admit to acute, stable at 11.4  Continue to monitor 15.  FLexor wtihdrawl spasms, will Rx with tizanidine , monitor for sedation LOS (Days) 12 A FACE TO FACE EVALUATION WAS PERFORMED  AndrCharlett Blake 08/18/2017 8:02 AM

## 2017-08-18 NOTE — Plan of Care (Signed)
  Problem: Consults Goal: RH STROKE PATIENT EDUCATION Description See Patient Education module for education specifics  Outcome: Progressing   Problem: RH BOWEL ELIMINATION Goal: RH STG MANAGE BOWEL WITH ASSISTANCE Description STG Manage Bowel with Mod. Assistance.  Outcome: Progressing   Problem: RH SKIN INTEGRITY Goal: RH STG MAINTAIN SKIN INTEGRITY WITH ASSISTANCE Description STG Maintain Skin Integrity With Mod Assistance.  Outcome: Progressing   Problem: RH SAFETY Goal: RH STG ADHERE TO SAFETY PRECAUTIONS W/ASSISTANCE/DEVICE Description STG Adhere to Safety Precautions With Mod Assistance/Device.  Outcome: Progressing Goal: RH STG DECREASED RISK OF FALL WITH ASSISTANCE Description STG Decreased Risk of Fall With Mod Assistance.  Outcome: Progressing   Problem: RH COGNITION-NURSING Goal: RH STG USES MEMORY AIDS/STRATEGIES W/ASSIST TO PROBLEM SOLVE Description STG Uses Memory Aids/Strategies With Mod Assistance to Problem Solve.  Outcome: Progressing   Problem: RH PAIN MANAGEMENT Goal: RH STG PAIN MANAGED AT OR BELOW PT'S PAIN GOAL Description Less than 3,on 1 to 10 scale  Outcome: Progressing   Problem: RH BLADDER ELIMINATION Goal: RH STG MANAGE BLADDER WITH ASSISTANCE Description STG Manage Bladder With Mod. Assistance  Outcome: Not Progressing

## 2017-08-18 NOTE — Progress Notes (Addendum)
At 3748 PRN tylenol, mylicon and eye drops given.Conjuctiva left eye red, with mild edema to eyelid. Without complaint of pain to RLE, until 0340, then grimacing in pain, right leg drawing up and calf muscle appears to cramp. PRN robaxin given at 0345. Offered PRN Robaxin at 0230, patient declined. At 2225, no void, bladder scan=356, I & O cath=300cc's. Urine with sediment, no odor. At [place], bladder scan=579, I & O cath=500cc's. Using call light this AM to request assistance. Sara Russell A

## 2017-08-18 NOTE — Progress Notes (Signed)
Nutrition Follow-up  DOCUMENTATION CODES:   Not applicable  INTERVENTION:  Continue Ensure Enlive po BID, each supplement provides 350 kcal and 20 grams of protein.  Provide 30 ml Prostat po once daily, each supplement provides 100 kcal and 15 grams of protein.   NUTRITION DIAGNOSIS:   Increased nutrient needs related to chronic illness(CHF) as evidenced by estimated needs; ongoing  GOAL:   Patient will meet greater than or equal to 90% of their needs; progressing  MONITOR:   PO intake, Supplement acceptance, Labs, Weight trends, I & O's, Skin  REASON FOR ASSESSMENT:   Malnutrition Screening Tool    ASSESSMENT:    84 year old right-handed female with history of mild renal insufficiency with creatinine 8.33, chronic diastolic congestive heart failure, atrial fibrillation maintained on amiodarone as well as Eliquis, DVT/PE, hypertension. Presented 08/02/2017 with right sided weakness, facial droop and aphasia.  Blood pressure 221/89.  MRI reviewed showed left thalamic hemorrhage.   Pt was asleep during time of visit and did not awaken to RD assessment. Meal completion has been varied from 25-75% with most recent intake at 30%. Pt currently has Ensure ordered and has been consuming them. RD to additionally order Prostat to aid in adequate protein needs. RD to continue to monitor.   NUTRITION - FOCUSED PHYSICAL EXAM:    Most Recent Value  Orbital Region  Unable to assess  Upper Arm Region  Unable to assess  Thoracic and Lumbar Region  No depletion  Buccal Region  Unable to assess  Temple Region  No depletion  Clavicle Bone Region  No depletion  Clavicle and Acromion Bone Region  No depletion  Scapular Bone Region  Unable to assess  Dorsal Hand  Unable to assess  Patellar Region  No depletion  Anterior Thigh Region  No depletion  Posterior Calf Region  No depletion  Edema (RD Assessment)  Mild  Hair  Reviewed  Eyes  Unable to assess  Mouth  Unable to assess  Skin   Reviewed  Nails  Unable to assess       Diet Order:   Diet Order           Diet regular Room service appropriate? Yes; Fluid consistency: Thin  Diet effective now          EDUCATION NEEDS:   Not appropriate for education at this time  Skin:  Skin Assessment: Reviewed RN Assessment  Last BM:  7/8  Height:   Ht Readings from Last 1 Encounters:  08/02/17 5' 4"  (1.626 m)    Weight:   Wt Readings from Last 1 Encounters:  08/18/17 153 lb 3.5 oz (69.5 kg)    Ideal Body Weight:  54.5 kg  BMI:  Body mass index is 26.3 kg/m.  Estimated Nutritional Needs:   Kcal:  1650-1800  Protein:  70-80 grams  Fluid:  Per MD    Corrin Parker, MS, RD, LDN Pager # [phone] After hours/ weekend pager # [phone]

## 2017-08-18 NOTE — Plan of Care (Signed)
In and out cath on a regular. Also has redness from INC.

## 2017-08-18 NOTE — Progress Notes (Signed)
Occupational Therapy Session Note  Patient Details  Name: Sara Russell MRN: [id number] Date of Birth: 08-04-1930  Today's Date: 08/18/2017 OT Individual Time: 0800-0908 OT Individual Time Calculation (min): 68 min    Short Term Goals: Week 2:  OT Short Term Goal 1 (Week 2): Pt will complete BSC transfer with mod A  OT Short Term Goal 2 (Week 2): Pt will complete bathing with mod A at sit > stand level OT Short Term Goal 3 (Week 2): Pt will don pants with mod A OT Short Term Goal 4 (Week 2): Pt will utilize RUE as gross assist during self-care tasks with min cues  Skilled Therapeutic Interventions/Progress Updates:    Pt received from NT supervising breakfast. Environmental modifications were made to maximize independence and safety with eating breakfast, including sitting more upright, placing food tray in a more accessible, ergonomic position, and minimizing distractions, I.e closing door and turning off TV. Pt was able to increase speed of eating and displayed no overt s/s of aspiration throughout meal. Pt transitioned to EOB with (S) and completed stand pivot to w/c with mod A. Pt was brought to sink and provided with vc/ set up to complete UB bathing. Pt often required tactile cue to accompany vc for initiation of bathing different body parts, perseverating on washing face. Pt stood at sink with mod A and then maintained standing with B UE on sink and CGA. During peri-hygiene pt experienced incontinent BM. OT was able to place [facility] behind pt and allow to sit and finish BM. Total A provided for brief management and peri hygiene in standing. Pt was left sitting up in w/c with QRB donned and all needs met.   Therapy Documentation Precautions:  Precautions Precautions: Fall Precaution Comments: watch BP / visual deficits; hx of spinal compression fxs (osteoporosis); L ankle fixed 2/2 ORIF 15 years ago Restrictions Weight Bearing Restrictions: No Vital Signs: Therapy Vitals Pulse Rate: (!)  54 Resp: 16 BP: 121/66 Patient Position (if appropriate): Lying Oxygen Therapy SpO2: 97 % O2 Device: Room Air Pain: Pain Assessment Pain Scale: Faces Pain Score: Asleep Faces Pain Scale: No hurt Pain Type: Acute pain Pain Location: Leg Pain Orientation: Right Pain Descriptors / Indicators: Cramping;Spasm Pain Frequency: Intermittent Pain Onset: On-going Pain Intervention(s): Medication (See eMAR) ADL: ADL Eating: Not assessed Grooming: Not assessed Toileting: Not assessed Toilet Transfer: Not assessed Toilet Transfer Method: Not assessed Tub/Shower Transfer: Not assessed Walk-In Shower Transfer: Not assessed ADL Comments: see functional nav. ADLs on eval limited d/t pts ability and level of function  See Function Navigator for Current Functional Status.   Therapy/Group: Individual Therapy  Curtis Sites 08/18/2017, 9:37 AM

## 2017-08-18 NOTE — Progress Notes (Signed)
Speech Language Pathology Daily Session Note  Patient Details  Name: AMBERA FEDELE MRN: [id number] Date of Birth: [birth year]  Today's Date: 08/18/2017 SLP Individual Time: 1300-1315 SLP Individual Time Calculation (min): 15 min  Short Term Goals: Week 2: SLP Short Term Goal 1 (Week 2): Pt will recognize and correct verbal errors at the word level with min assist verbal cues.   SLP Short Term Goal 2 (Week 2): Pt will demonstrate selective attention in mildly distracting environment for ~ 15 minutes with Mod A cues.  SLP Short Term Goal 3 (Week 2): Pt will complete basic familiar tasks with supervision cues.  SLP Short Term Goal 4 (Week 2): Pt will scan to right of midline to locate objects in 8 of 10 opportunities with Supervision cues.  SLP Short Term Goal 5 (Week 2): Pt will verbalize at the phrase level during nonstructured tasks with mod assist multimodal cues.    Skilled Therapeutic Interventions:Skilled ST services focused on cognitive skills. Pt was asleep upon entering room. SLP attempted to gain arousal with max A verbal, visual and tactile cues including sternal rub and cold compress, pt demonstrated fleeting eye opening however not able to maintain alertness for skilled therapeutic services. SLP communicated with NT and PT, both reported similar behavior, possibly due to medication. Therapy session was ended due to inability to participate/fatgue. Pt was left in room with call bell within reach and bed alarm set. Recommend to continue skilled ST services.      Function:  Eating Eating   Modified Consistency Diet: No Eating Assist Level: Set up assist for;More than reasonable amount of time;Help managing cup/glass   Eating Set Up Assist For: Opening containers;Cutting food       Cognition Comprehension Comprehension assist level: Understands basic less than 25% of the time/ requires cueing >75% of the time  Expression   Expression assist level: Expresses basis less than 25%  of the time/requires cueing >75% of the time.  Social Interaction Social Interaction assist level: Interacts appropriately less than 25% of the time. May be withdrawn or combative.  Problem Solving Problem solving assist level: Solves basic less than 25% of the time - needs direction nearly all the time or does not effectively solve problems and may need a restraint for safety  Memory Memory assist level: Recognizes or recalls less than 25% of the time/requires cueing greater than 75% of the time    Pain Pain Assessment Pain Score: 0-No pain  Therapy/Group: Individual Therapy  Izel Eisenhardt 08/18/2017, 1:19 PM

## 2017-08-18 NOTE — Progress Notes (Signed)
Physical Therapy Session Note  Patient Details  Name: Sara Russell MRN: [id number] Date of Birth: October 16, 1930  Today's Date: 08/18/2017 PT Individual Time: 0932-1016 PT Individual Time Calculation (min): 44 min   Short Term Goals: Week 2:  PT Short Term Goal 1 (Week 2): Pt will propel w/c 150 ft with min assist. PT Short Term Goal 2 (Week 2): Pt will ambulate 25 ft with LRAD & mod assist. PT Short Term Goal 3 (Week 2): Pt will perform bed mobility with hospital bed features & min assist.  Skilled Therapeutic Interventions/Progress Updates:  Pt received in w/c & agreeable to tx. Transported pt to gym via w/c total assist and attempted to assist pt with w/c>mat transfer with RW after demonstration from therapist. Therapist provided assistance to place RUE on hand orthosis and LUE on armrest to push up as pt attempted to pull up on RW. Pt requires mod assist for sit>stand transfer and manual facilitation for weight shifting L to allow advancement of RLE but pt requires assistance for RLE advancement and with minimal to no foot clearance RLE. Pt requires significantly extra time for advancement of BLE and with progressing R lateral lean and pt beginning to sit with therapist ultimately having to provide w/c for pt to sit as she was unable to complete transfer. SpO2 = 100%, HR = 55 bpm and pt eventually stating therapy is "too much" with significantly extra time and max encouragement to verbally communicate. Pt refused pushing w/c back towards room so pt returned via w/c total assist. Encouraged pt to ambulate w/c>bed with RW with therapist providing same assistance for hand placement and transfer but pt unable/unwilling to advance BLE and reporting some pain in RLE. RN made aware of pt's c/o pain while standing/weight bearing but no pain at rest. Pt reluctant but willing to attempt seated BLE strengthening exercises. Pt performed a few reps of long arc quads LLE with 3# weights on BLE ankles with max  encouragement to verbally count repetitions and to attend to task but pt beginning to fall asleep. Pt assisted w/c>bed with stedy lift with assistance to prevent R lateral lean while sitting in lift. Pt requires mod assist for sit>supine with therapist placing LE on bed. Pt left in bed with alarm set & all needs within reach. RN made aware of pt's status.  Therapy Documentation Precautions:  Precautions Precautions: Fall Precaution Comments: watch BP / visual deficits; hx of spinal compression fxs (osteoporosis); L ankle fixed 2/2 ORIF 15 years ago Restrictions Weight Bearing Restrictions: No General: PT Amount of Missed Time (min): 16 Minutes PT Missed Treatment Reason: Patient fatigue    See Function Navigator for Current Functional Status.   Therapy/Group: Individual Therapy  Waunita Schooner 08/18/2017, 10:26 AM

## 2017-08-18 NOTE — Progress Notes (Signed)
Continued to complain of pain to RLE, after robaxin was given at 0345, PRN tylenol given at 0600. Currently sleeping. Patrici Ranks A

## 2017-08-19 ENCOUNTER — Inpatient Hospital Stay (HOSPITAL_COMMUNITY): Payer: Medicare Other

## 2017-08-19 ENCOUNTER — Inpatient Hospital Stay (HOSPITAL_COMMUNITY): Payer: Medicare Other | Admitting: Occupational Therapy

## 2017-08-19 ENCOUNTER — Inpatient Hospital Stay (HOSPITAL_COMMUNITY): Payer: No Typology Code available for payment source | Admitting: Occupational Therapy

## 2017-08-19 MED ORDER — TIZANIDINE HCL 2 MG PO TABS
2.0000 mg | ORAL_TABLET | Freq: Three times a day (TID) | ORAL | Status: DC
  Administered 2017-08-19 – 2017-08-27 (×31): 2 mg via ORAL
  Filled 2017-08-19 (×32): qty 1

## 2017-08-19 NOTE — Progress Notes (Signed)
Occupational Therapy Session Note  Patient Details  Name: Sara Russell MRN: [id number] Date of Birth: 04-24-1930  Today's Date: 08/19/2017 OT Individual Time: 8768-1157 and 2620-3559 OT Individual Time Calculation (min): 46 min and 35 min and Today's Date: 08/19/2017 OT Missed Time: 14 Minutes and 10 min Missed Time Reason: Patient unwilling/refused to participate without medical reason   Short Term Goals: Week 2:  OT Short Term Goal 1 (Week 2): Pt will complete BSC transfer with mod A  OT Short Term Goal 2 (Week 2): Pt will complete bathing with mod A at sit > stand level OT Short Term Goal 3 (Week 2): Pt will don pants with mod A OT Short Term Goal 4 (Week 2): Pt will utilize RUE as gross assist during self-care tasks with min cues  Skilled Therapeutic Interventions/Progress Updates:    1) Treatment session with focus on activity tolerance and participation in therapy session.  Pt received supine in bed c/o spasms in RLE and grimacing and reaching for RLE.  Repositioned in bed with some relief.  Pt declined OOB activity or donning clothes this session.  Pt began yelling about coffee with cream.  Offered pt her coffee from her tray to which pt held it but would not drink.  Pt still demanding coffee with cream.  Through much discussion (secondary to aphasia and apraxia) pt willing to attempt a new cup of coffee which therapist provided.  Pt drank coffee but still refusing OOB activity or any further treatment recommendations at this time.  Pt missed 14 mins, will continue to follow as able.  2) Treatment session with focus on Rt attention, orientation, and RUE use.  Pt received in bed again c/o spasms in RLE.  Pt refusing OOB activity and still refusing to don clothing.  Attempted to engage pt in visual scanning activity to Rt to increase attention to and awareness of Rt environment.  Engaged in [place] pinch grasp with yellow resistive clothespins with pt able to remove and replace clothespins on  cup when placed in front of Rt hand.  Pt requiring max encouragement to participate in treatment session due to distractibility as well as pain in RLE.  Engaged in orientation questioning with pt unable to identify day or month with pt stating "You don't know?" and "February".  Noted "knot" in Rt calf during spasms, LPN notified.  Pt repositioned to increase comfort and pt asking for "radio" (meaning tv) to be turned back on.  Therapy Documentation Precautions:  Precautions Precautions: Fall Precaution Comments: watch BP / visual deficits; hx of spinal compression fxs (osteoporosis); L ankle fixed 2/2 ORIF 15 years ago Restrictions Weight Bearing Restrictions: No General: General OT Amount of Missed Time: 14 Minutes Vital Signs:  Pain: Pain Assessment Faces Pain Scale: Hurts whole lot(pt making frequent pain sounds) Pain Type: Other (Comment)(spasm) Pain Location: Leg Pain Orientation: Right Pain Descriptors / Indicators: Crying;Cramping;Spasm Pain Onset: On-going Patients Stated Pain Goal: 0 Pain Intervention(s): Medication (See eMAR);Heat applied;Massage(xanaflex) Multiple Pain Sites: No  See Function Navigator for Current Functional Status.   Therapy/Group: Individual Therapy  Simonne Come 08/19/2017, 10:50 AM

## 2017-08-19 NOTE — Progress Notes (Signed)
Occupational Therapy Session Note  Patient Details  Name: Sara Russell MRN: [id number] Date of Birth: [birth year]  Today's Date: 08/19/2017 OT Individual Time: 1230-1300 OT Individual Time Calculation (min): 30 min (Make-up session)   Short Term Goals: Week 2:  OT Short Term Goal 1 (Week 2): Pt will complete BSC transfer with mod A  OT Short Term Goal 2 (Week 2): Pt will complete bathing with mod A at sit > stand level OT Short Term Goal 3 (Week 2): Pt will don pants with mod A OT Short Term Goal 4 (Week 2): Pt will utilize RUE as gross assist during self-care tasks with min cues  Skilled Therapeutic Interventions/Progress Updates:    Pt seen for OT make-up session focusing on functional mobility and orientation. Pt in supine upon arrival with RN present assisting with hygiene following incontinent BM at bed level. Therapist assisted with min A bed mobility using bed rails to assist with hygiene and donning new brief. Pt able to consistently answer "yes/no" questions. Set-up with meal tray sitting upright in bed. Pt able to locate all food items on tray and self fed minimal amount of meal using L UE.  Pt's son arrived, pt unable to identify son despite multi-modal and contextual cuing. Pt perseverative on naming son "Laurey Arrow" which is pt's brother's name. With increased time she was able to state she had 3 sons and could name her 3 sons, however, could not name son that was present. Pt not oriented to place- hospital or city when given option of 2, also not oriented to situation this session. Pt unable to be corrected or re-directed when she provided inaccurate orientation answers. Pt left in supine at end of session with son present, all needs in reach. Pt's son very interested in pt progress and events of therapy sessions from today. Provided education as able.   Therapy Documentation Precautions:  Precautions Precautions: Fall Precaution Comments: watch BP / visual deficits; hx of spinal  compression fxs (osteoporosis); L ankle fixed 2/2 ORIF 15 years ago Restrictions Weight Bearing Restrictions: No Pain: Pain Assessment Grimacing and pointing to R LE. RN aware. Repositioned for comfort.   See Function Navigator for Current Functional Status.   Therapy/Group: Individual Therapy  [name] L 08/19/2017, 12:26 PM

## 2017-08-19 NOTE — Progress Notes (Signed)
[facility]IC[facility]IT[facility]     PROGRESS NOTE    Subjective/Complaints:  Some relief of spasm with tiznidine but woke up early today with RLE spasm  ROS: limited due to cognition/aphasia  Objective:  No results found. No results for input(s): WBC, HGB, HCT, PLT in the last 72 hours. No results for input(s): NA, K, CL, GLUCOSE, BUN, CREATININE, CALCIUM in the last 72 hours.  Invalid input(s): CO CBG (last 3)  No results for input(s): GLUCAP in the last 72 hours.  Wt Readings from Last 3 Encounters:  08/19/17 68 kg (149 lb 14.6 oz)  08/02/17 70.1 kg (154 lb 8.7 oz)  05/05/17 70.3 kg (155 lb)     Intake/Output Summary (Last 24 hours) at 08/19/2017 0759 Last data filed at 08/19/2017 0630 Gross per 24 hour  Intake 840 ml  Output 1175 ml  Net -335 ml    Vital Signs: Blood pressure (!) 135/58, pulse (!) 55, temperature (!) 97.5 F (36.4 C), temperature source Oral, resp. rate 16, weight 68 kg (149 lb 14.6 oz), SpO2 98 %. Physical Exam:  Constitutional: No distress . Vital signs reviewed. HENT: Normocephalic.  Atraumatic. Eyes: EOMI. No discharge. Cardiovascular: RRR. No JVD. Respiratory: CTA Bilaterally. Normal effort. GI: BS +. Non-distended. Musc: No edema or tenderness in extremities. Neurological: She is alert  Global aphasia Right central 7 Dysarthria Motor RUE 3-/5 proximal to distal RLE: 3+ to 4-/5 HF, 4/5 distally LUE and LLE 4 to 4+/5. Apraxic Skin: Skin is warm and dry.  Psychiatric: pleasant and cooperative  Assessment/Plan: 1. Functional deficits secondary to left thalamic/internal capsule hemorrhage which require 3+ hours per day of interdisciplinary therapy in a comprehensive inpatient rehab setting. Physiatrist is providing close team supervision and 24 hour management of active medical problems listed below. Physiatrist and rehab team continue to assess barriers to discharge/monitor patient progress toward functional and medical  goals.  Function:  Bathing Bathing position   Position: Wheelchair/chair at sink  Bathing parts Body parts bathed by patient: Right arm, Chest, Abdomen, Right upper leg, Left upper leg, Left arm, Front perineal area Body parts bathed by helper: Buttocks, Right lower leg, Left lower leg, Back  Bathing assist Assist Level: Touching or steadying assistance(Pt > 75%)      Upper Body Dressing/Undressing Upper body dressing   What is the patient wearing?: Pull over shirt/dress     Pull over shirt/dress - Perfomed by patient: Thread/unthread left sleeve, Put head through opening, Pull shirt over trunk, Thread/unthread right sleeve Pull over shirt/dress - Perfomed by helper: Thread/unthread right sleeve        Upper body assist Assist Level: Supervision or verbal cues, More than reasonable time      Lower Body Dressing/Undressing Lower body dressing   What is the patient wearing?: Pants, Underwear       Pants- Performed by helper: Thread/unthread right pants leg, Thread/unthread left pants leg, Pull pants up/down   Non-skid slipper socks- Performed by helper: Don/doff right sock, Don/doff left sock                  Lower body assist Assist for lower body dressing: Touching or steadying assistance (Pt > 75%)      Toileting Toileting Toileting activity did not occur: No continent bowel/bladder event   Toileting steps completed by helper: Adjust clothing prior to toileting, Performs perineal hygiene, Adjust clothing after toileting Toileting Assistive Devices: Other (comment)(stedy)  Toileting assist Assist level: Touching or steadying assistance (Pt.75%)  Transfers Chair/bed transfer   Chair/bed transfer method: Other Chair/bed transfer assist level: Moderate assist (Pt 50 - 74%/lift or lower) Chair/bed transfer assistive device: Mechanical lift Mechanical lift: Stedy   Locomotion Ambulation Ambulation activity did not occur: (P) Safety/medical concerns   Max  distance: 32f Assist level: Moderate assist (Pt 50 - 74%)   Wheelchair   Type: Manual Max wheelchair distance: 100 ft Assist Level: Moderate assistance (Pt 50 - 74%)  Cognition Comprehension Comprehension assist level: Understands basic less than 25% of the time/ requires cueing >75% of the time  Expression Expression assist level: Expresses basis less than 25% of the time/requires cueing >75% of the time.  Social Interaction Social Interaction assist level: Interacts appropriately less than 25% of the time. May be withdrawn or combative.  Problem Solving Problem solving assist level: Solves basic less than 25% of the time - needs direction nearly all the time or does not effectively solve problems and may need a restraint for safety  Memory Memory assist level: Recognizes or recalls less than 25% of the time/requires cueing greater than 75% of the time   Medical Problem List and Plan: 1.  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  DVT Prophylaxis/Anticoagulation: SCDs. 3. Pain Management: Tylenol as needed 4. Mood: Provide emotional support 5. Neuropsych: This patient is not capable of making decisions on her own behalf. 6. Skin/Wound Care: Routine skin checks 7. Fluids/Electrolytes/Nutrition: encourage PO intake  -hypokalemia: resolved   -kdur to 280m bid   -recheck 7/1 was 4.4 8.  Atrial fibrillation.  Amiodarone 100 mg daily.  Cardiac rate controlled.  Eliquis on hold due to  hemorrhage 9.  Hypertension.  Coreg 12.5 mg twice daily, increased coreg to 18.7544mid   Vitals:   08/18/17 2011 08/19/17 0525  BP: (!) 111/54 (!) 135/58  Pulse: 60 (!) 55  Resp: 16 16  Temp: (!) 97.5 F (36.4 C) (!) 97.5 F (36.4 C)   SpO2: 98% 98%  Controlled 7/9 10.  Chronic diastolic congestive heart failure.  Monitor for signs of fluid overload Filed Weights   08/17/17 0324 08/18/17 0330 08/19/17 0500  Weight: 67 kg (147 lb 11.3 oz) 69.5 kg (153 lb 3.5 oz) 68 kg (149 lb 14.6 oz)   Stable on 7/9 11.  Hypothyroidism.  Synthroid 12.  Renal insufficiency.  Baseline creatinine 1.39.  Creatinine 1.04 on 6/28, 1.21 on 7/1, recheck in am  Continue to monitor, I [id number].  Hyperlipidemia.  Lipitor 14. Acute blood loss anemia  Hemoglobin 11.2 on 6/27, 11.9 upon admit to acute, stable at 11.4  Continue to monitor 15.  FLexor wtihdrawl spasms, will increase tizanidine 2mg 55m , monitor for sedation LOS (Days) 13 A FACE TO FACE EVALUATION WAS PERFORMED  Sara Blake7/10/2017 7:59 AM

## 2017-08-19 NOTE — Progress Notes (Signed)
Occupational Therapy Session Note  Patient Details  Name: Sara Russell MRN: [id number] Date of Birth: Mar 02, 1930  Today's Date: 08/19/2017 OT Individual Time: 1000-1030 OT Individual Time Calculation (min): 30 min    Short Term Goals: Week 1:  OT Short Term Goal 1 (Week 1): Pt will complete UB washing with moderate assist OOB OT Short Term Goal 1 - Progress (Week 1): Met OT Short Term Goal 2 (Week 1): Pt will complete BSC transfer with mod A  OT Short Term Goal 2 - Progress (Week 1): Progressing toward goal OT Short Term Goal 3 (Week 1): Pt will complete grooming at sink at w/c level with mod A  OT Short Term Goal 3 - Progress (Week 1): Met OT Short Term Goal 4 (Week 1): Pt will perform UB dressing using hemiplegic dressing techinques with mod A  OT Short Term Goal 4 - Progress (Week 1): Met OT Short Term Goal 5 (Week 1): Pt will eat lunch up in wheelchair with S.  OT Short Term Goal 5 - Progress (Week 1): Met Week 2:  OT Short Term Goal 1 (Week 2): Pt will complete BSC transfer with mod A  OT Short Term Goal 2 (Week 2): Pt will complete bathing with mod A at sit > stand level OT Short Term Goal 3 (Week 2): Pt will don pants with mod A OT Short Term Goal 4 (Week 2): Pt will utilize RUE as gross assist during self-care tasks with min cues     Skilled Therapeutic Interventions/Progress Updates:    Pt seen this session for RUE AROM.  Pt stated that she is not in pain at rest but she has pain when she tries to lift arm. Pt worked on AROM of scapular elevation, retraction and protraction with NO pain.  A/AROM of elbow extension.  Pt used UE glider with low angle for sh flexion at 45 degrees for forward reaching and circular patterns with no pain. Then repeated exercises with 60 degrees of sh flexion with no pain. Encouraged pt to work on sh strength in pain free ROM.  Bimanual sh flex with dowel bar to 60 degrees. Pt tolerated exercises well with no pain. Pt taken to her next therapy session.      Therapy Documentation Precautions:  Precautions Precautions: Fall Precaution Comments: watch BP / visual deficits; hx of spinal compression fxs (osteoporosis); L ankle fixed 2/2 ORIF 15 years ago Restrictions Weight Bearing Restrictions: No   Pain: Pain Assessment Pain Score: 0-No pain ADL:  See Function Navigator for Current Functional Status.   Therapy/Group: Individual Therapy  [place] 08/19/2017, 12:03 PM

## 2017-08-19 NOTE — Progress Notes (Signed)
Speech Language Pathology Daily Session Note  Patient Details  Name: Sara Russell MRN: [id number] Date of Birth: 1930/03/25  Today's Date: 08/19/2017 SLP Individual Time: 1030-1100 / 800-830 SLP Individual Time Calculation (min): 30 min and 30 min  Short Term Goals: Week 2: SLP Short Term Goal 1 (Week 2): Pt will recognize and correct verbal errors at the word level with min assist verbal cues.   SLP Short Term Goal 2 (Week 2): Pt will demonstrate selective attention in mildly distracting environment for ~ 15 minutes with Mod A cues.  SLP Short Term Goal 3 (Week 2): Pt will complete basic familiar tasks with supervision cues.  SLP Short Term Goal 4 (Week 2): Pt will scan to right of midline to locate objects in 8 of 10 opportunities with Supervision cues.  SLP Short Term Goal 5 (Week 2): Pt will verbalize at the phrase level during nonstructured tasks with mod assist multimodal cues.    Skilled Therapeutic Interventions:  #1 Skilled ST services focused on cognitive skills. Pt expressed pain upon entering room and required max A verbal cues yes/no questions to explain location of pain, leg cramp. SLP communicated with nurse, medication was given piror to session. SLP facilitated basic problem solving skills during PO consumption of breakfast tray consuming minimal amount with participation limited by pain. SLP facilitated non structured expressive thought with leading questions with mod A verbal cue. Pt was left in room with call bell within reach and bed alaram set.ST reccomends to continuedd skilled ST services.    #2 Skilled ST services focused on speech skills. SLP facilitated expressive verbalization given picture cards in structured task and in non structured tasks, pt required mod A verbal cues and min A verbal cues to correct preservation. Pt demonstrated selective attention in 15 minute intervals with mod A verbals and scan to right to locate picture cards with supervision A verbal cues.  Pt was left in room with call bell within reach and bed alaram set.ST reccomends to continuedd skilled ST services.      Function:  Eating Eating                 Cognition Comprehension Comprehension assist level: Understands basic 50 - 74% of the time/ requires cueing 25 - 49% of the time;Understands basic 25 - 49% of the time/ requires cueing 50 - 75% of the time  Expression   Expression assist level: Expresses basic 50 - 74% of the time/requires cueing 25 - 49% of the time. Needs to repeat parts of sentences.  Social Interaction Social Interaction assist level: Interacts appropriately 50 - 74% of the time - May be physically or verbally inappropriate.  Problem Solving Problem solving assist level: Solves basic 25 - 49% of the time - needs direction more than half the time to initiate, plan or complete simple activities  Memory Memory assist level: Recognizes or recalls 25 - 49% of the time/requires cueing 50 - 75% of the time    Pain Pain Assessment Pain Score: 0-No pain Faces Pain Scale: Hurts whole lot(pt making frequent pain sounds) Pain Type: Other (Comment)(spasm) Pain Location: Leg Pain Orientation: Right Pain Descriptors / Indicators: Crying;Cramping;Spasm Pain Onset: On-going Patients Stated Pain Goal: 0 Pain Intervention(s): Medication (See eMAR);Heat applied;Massage(xanaflex) Multiple Pain Sites: No  Therapy/Group: Individual Therapy  Jinx Gilden 08/19/2017, 11:02 AM

## 2017-08-19 NOTE — Progress Notes (Signed)
Physical Therapy Session Note  Patient Details  Name: Sara Russell MRN: [id number] Date of Birth: [birth year]  Today's Date: 08/19/2017 PT Individual Time: 0934(make up time)-1000 PT Individual Time Calculation (min): 26 min   Short Term Goals: Week 2:  PT Short Term Goal 1 (Week 2): Pt will propel w/c 150 ft with min assist. PT Short Term Goal 2 (Week 2): Pt will ambulate 25 ft with LRAD & mod assist. PT Short Term Goal 3 (Week 2): Pt will perform bed mobility with hospital bed features & min assist.  Skilled Therapeutic Interventions/Progress Updates:   Pt in supine and agreeable to participate in therapy for make-up time. No c/o pain throughout session. Worked on functional mobility and tolerance to upright/OOB activity this session. Transferred to EOB w/ supervision and increased time. Pt declined changing out of night gown. Transferred to w/c via stedy w/ min assist to boost into standing. Provided mod assist for pt to don sweatshirt and set-up assist to brush hair w/ visual cues. Total assist w/c transport to/from therapy gym. Worked on seated cognitive tasks requiring visual scanning to both sides of environment to match and sort objects. Min verbal and visual cues throughout. Returned to room and ended session in w/c, call bell within reach and all needs met. Quick release belt donned.   Therapy Documentation Precautions:  Precautions Precautions: Fall Precaution Comments: watch BP / visual deficits; hx of spinal compression fxs (osteoporosis); L ankle fixed 2/2 ORIF 15 years ago Restrictions Weight Bearing Restrictions: No Pain: Pain Assessment Faces Pain Scale: Hurts whole lot(pt making frequent pain sounds) Pain Type: Other (Comment)(spasm) Pain Location: Leg Pain Orientation: Right Pain Descriptors / Indicators: Crying;Cramping;Spasm Pain Onset: On-going Patients Stated Pain Goal: 0 Pain Intervention(s): Medication (See eMAR);Heat applied;Massage(xanaflex) Multiple Pain  Sites: No   See Function Navigator for Current Functional Status.   Therapy/Group: Individual Therapy  Artice Holohan K Arnette 08/19/2017, 10:05 AM

## 2017-08-19 NOTE — Progress Notes (Addendum)
Pt was alert and talkative for a few hours before falling asleep close to 2300. 240cc of water and a full vanilla yogurt was consumed before going to sleep. Son was present. Pt continues to take medication for spasms to right leg. Medication was effective last night but pt c/o right leg pain as soon as she woke up this morning.Tylenol was given.  Based off yelling several times and face  frowning pain is assumed to be above 5.

## 2017-08-20 ENCOUNTER — Inpatient Hospital Stay (HOSPITAL_COMMUNITY): Payer: Medicare Other

## 2017-08-20 ENCOUNTER — Inpatient Hospital Stay (HOSPITAL_COMMUNITY): Payer: Medicare Other | Admitting: Occupational Therapy

## 2017-08-20 ENCOUNTER — Ambulatory Visit (HOSPITAL_COMMUNITY): Payer: Medicare Other | Admitting: Speech Pathology

## 2017-08-20 ENCOUNTER — Inpatient Hospital Stay (HOSPITAL_COMMUNITY): Payer: No Typology Code available for payment source | Admitting: Physical Therapy

## 2017-08-20 MED ORDER — BETHANECHOL CHLORIDE 10 MG PO TABS
10.0000 mg | ORAL_TABLET | Freq: Three times a day (TID) | ORAL | Status: DC
  Administered 2017-08-20 – 2017-08-27 (×22): 10 mg via ORAL
  Filled 2017-08-20 (×22): qty 1

## 2017-08-20 NOTE — Progress Notes (Signed)
Physical Therapy Session Note  Patient Details  Name: Sara Russell MRN: [id number] Date of Birth: [birth year]  Today's Date: 08/20/2017 PT Individual Time: 0900-1000 PT Individual Time Calculation (min): 60 min   Short Term Goals: Week 2:  PT Short Term Goal 1 (Week 2): Pt will propel w/c 150 ft with min assist. PT Short Term Goal 2 (Week 2): Pt will ambulate 25 ft with LRAD & mod assist. PT Short Term Goal 3 (Week 2): Pt will perform bed mobility with hospital bed features & min assist.  Skilled Therapeutic Interventions/Progress Updates: Pt received seated in w/c, denies pain and agreeable to treatment. B TEDs and shoes donned totalA. Pt requests to brush hair at sink; performs with setupA. Sit >stand x1 with posterior LOB/pt returning to sitting d/t significant pushing with L extremities on armrest. Stand pivot transfer w/c >mat table with RW and minA, significantly increased time and moderate verbal/tactile cues for technique as pt attempting to ambulate forward and requires facilitation to turn and back up to mat table. Sitting> standing no UE support reaching LUE to L side to retrieve horseshoes for focus on L weight shift and postural orientation to reduce pushing in standing. Standing alternating toe taps to 3" step with RW BUE support, x6 reps each side; tactile cues for R stance control, increased time to initiate L weight shift for R toe tap. Gait x15' with RW and minA; therapist facilitated R anterior pelvic rotation to improve R step length and tactile cues for R glute in stance control, auditory cues for R/L to increase speed. Gait forward/backward with L hall rail and R HHA x10' each direction with minA; continued auditory cues for R/L stepping to increase speed and fluency. Returned to room totalA; remained seated in w/c, lap belt alarm intact and all needs in reach.      Therapy Documentation Precautions:  Precautions Precautions: Fall Precaution Comments: watch BP / visual  deficits; hx of spinal compression fxs (osteoporosis); L ankle fixed 2/2 ORIF 15 years ago Restrictions Weight Bearing Restrictions: No Pain: Pain Assessment Pain Scale: 0-10 Pain Score: 0-No pain    See Function Navigator for Current Functional Status.   Therapy/Group: Individual Therapy  Corliss Skains 08/20/2017, 10:03 AM

## 2017-08-20 NOTE — Progress Notes (Signed)
Social Work   Veasna Santibanez, [name]  Social Worker  Physical Medicine and Rehabilitation  Patient Care Conference  Signed  Date of Service:  08/20/2017  2:33 PM          Signed          Show:Clear all [x] Manual[x] Template[] Copied  Added by: [x] Ernan Runkles, Gardiner Rhyme, LCSW   [] Hover for details   Inpatient RehabilitationTeam Conference and Plan of Care Update Date: 08/20/2017   Time: 10:35 AM      Patient Name: Sara Russell      Medical Record Number: [id number]  Date of Birth: 22-Jan-1931 Sex: Female         Room/Bed: 4W20C/4W20C-01 Payor Info: Payor: MEDICARE / Plan: MEDICARE PART A AND B / Product Type: *No Product type* /     Admitting Diagnosis: Left thalamus hemorrhage  Admit Date/Time:  08/06/2017  3:56 PM Admission Comments: No comment available    Primary Diagnosis:  <principal problem not specified> Principal Problem: <principal problem not specified>       Patient Active Problem List    Diagnosis Date Noted  . Acute blood loss anemia    . Renal insufficiency    . Essential hypertension    . Labile blood pressure    . Hypokalemia    . Thalamic hemorrhage ([place]) 08/06/2017  . Hemorrhagic stroke ([place])    . Hyperlipidemia    . PAF (paroxysmal atrial fibrillation) ([place])    . Benign essential HTN    . Stage 3 chronic kidney disease ([place])    . ICH (intracerebral hemorrhage) ([place]) 08/03/2017  . At high risk for falls 02/12/2017  . Insomnia 02/12/2017  . Pulmonary nodules/lesions, multiple 01/29/2017  . Acute pain of right shoulder 01/28/2017  . Hypothyroidism 04/04/2015  . Compression fracture of L1 lumbar vertebra (HCC) 02/23/2015  . Cholelithiases 02/23/2015  . Encounter for palliative care    . Chronic diastolic congestive heart failure ([place])    . Osteoporosis 05/13/2014  . Gout 08/16/2013  . Hyponatremia 08/10/2013  . Left atrial enlargement 01/10/2013  . A-fib ([place]) 12/08/2012  . Rosacea 05/15/2012  . Anemia due to chronic blood loss  08/28/2010  . DEGENERATIVE DISC DISEASE, LUMBAR SPINE 07/14/2009  . GERD 07/11/2009  . PERSONAL HX COLONIC POLYPS 07/11/2009  . HYPERCHOLESTEROLEMIA 04/10/2006  . HYPERTENSION, BENIGN SYSTEMIC 04/10/2006  . History of pulmonary embolism 04/10/2006  . DIVERTICULOSIS OF COLON 04/10/2006  . ACTINIC KERATOSIS 04/10/2006  . GEN OSTEOARTHROSIS INVOLVING MULTIPLE SITES 04/10/2006      Expected Discharge Date: Expected Discharge Date: 08/27/17   Team Members Present: Physician leading conference: Dr. Alysia Penna Social Worker Present: Ovidio Kin, LCSW Nurse Present: Other (comment)(Kayla Mabe-LPN) PT Present: Michaelene Song, PT OT Present: Benay Pillow, OT SLP Present: Windell Moulding, SLP PPS Coordinator present : Daiva Nakayama, RN, CRRN       Current Status/Progress Goal Weekly Team Focus  Medical     Urinary retention allergy to sulfa limits use of Flomax, trial of Urecholine  Maintain medical stability  Decreased flexor withdrawal spasticity   Bowel/Bladder     INC of bowel and bladder. In and out cath are being preformed once or twice each shift.   Pt is to completely empty bladder without assistance with in 30 days.   Cont bladder training as recommended while awake.    Swallow/Nutrition/ Hydration               ADL's     mod assist bathing, Setup UB  dressing, total assist LB dressing, mod assist stand pivot transfers.  Pt with increased pain and spasms in RLE impeding progress and participation in therapies  Min assist overall  ADL retraining, dynamic sitting and standing balance, LB dressing, RUE NMR   Mobility     mod assist for sit<>stand, mod assist stand pivot with RW and mod assist up to 15 ft with RW, very fatigued and poor endurance  min assist overall except supervision w/c mobility  transfers, gait, NMR, activity tolerance, strengthening, endurance    Communication     Mod A  Min A   functional communication of needs and wants    Safety/Cognition/ Behavioral  Observations   mod-max assist   min assist   attention, problem solving, safety awareness    Pain     Pain in right lower leg and groin.   To have pain rated at a 3 or below.   Contionue to monitor pain, treat as prescribed and report concerns or change immediately.    Skin     Groin and buttlock red and swollen.   No skin breakdown.  Continue to apply barrier cream with each diaper change.      *See Care Plan and progress notes for long and short-term goals.      Barriers to Discharge   Current Status/Progress Possible Resolutions Date Resolved   Physician     Neurogenic Bowel & Bladder     Progress towards goals  Continue rehabilitation program, spasticity management with medications both oral and possibly IM      Nursing                 PT                    OT                 SLP            SW              Discharge Planning/Teaching Needs:  Working with [facility] on transfer to their rehab next week 7/17. Son's here daily and participate in her care.      Team Discussion:  Making slow progress in her therapies, able to speak more and express her wants and basic needs. L-LE pain is limiting her in therapies and her participation-MD addressing this. Still being I & O cath and incontinent of bowel. Nursing to try timed toileting. Working on transition to [facility] rehab next [employer].  Revisions to Treatment Plan:  Transfer to [facility] rehab 7/17    Continued Need for Acute Rehabilitation Level of Care: The patient requires daily medical management by a physician with specialized training in physical medicine and rehabilitation for the following conditions: Daily direction of a multidisciplinary physical rehabilitation program to ensure safe treatment while eliciting the highest outcome that is of practical value to the patient.: Yes Daily medical management of patient stability for increased activity during participation in an intensive rehabilitation regime.: Yes Daily  analysis of laboratory values and/or radiology reports with any subsequent need for medication adjustment of medical intervention for : Neurological problems   Aryannah Mohon, Gardiner Rhyme 08/20/2017, 2:33 PM                  Klare Criss, Gardiner Rhyme, LCSW  Social Worker  Physical Medicine and Rehabilitation  Patient Care Conference  Signed  Date of Service:  08/13/2017  1:24 PM  Signed          Show:Clear all [x] Manual[x] Template[] Copied  Added by: [x] Arien Benincasa, Gardiner Rhyme, LCSW   [] Hover for details   Inpatient RehabilitationTeam Conference and Plan of Care Update Date: 08/13/2017   Time: 10:40 AM      Patient Name: Sara Russell      Medical Record Number: [id number]  Date of Birth: 03-Dec-1930 Sex: Female         Room/Bed: 4W20C/4W20C-01 Payor Info: Payor: MEDICARE / Plan: MEDICARE PART A AND B / Product Type: *No Product type* /     Admitting Diagnosis: Left thalamus hemorrhage  Admit Date/Time:  08/06/2017  3:56 PM Admission Comments: No comment available    Primary Diagnosis:  <principal problem not specified> Principal Problem: <principal problem not specified>       Patient Active Problem List    Diagnosis Date Noted  . Acute blood loss anemia    . Renal insufficiency    . Essential hypertension    . Labile blood pressure    . Hypokalemia    . Thalamic hemorrhage ([place]) 08/06/2017  . Hemorrhagic stroke ([place])    . Hyperlipidemia    . PAF (paroxysmal atrial fibrillation) ([place])    . Benign essential HTN    . Stage 3 chronic kidney disease ([place])    . ICH (intracerebral hemorrhage) ([place]) 08/03/2017  . At high risk for falls 02/12/2017  . Insomnia 02/12/2017  . Pulmonary nodules/lesions, multiple 01/29/2017  . Acute pain of right shoulder 01/28/2017  . Hypothyroidism 04/04/2015  . Compression fracture of L1 lumbar vertebra (HCC) 02/23/2015  . Cholelithiases 02/23/2015  . Encounter for palliative care    . Chronic diastolic congestive heart failure ([place])      . Osteoporosis 05/13/2014  . Gout 08/16/2013  . Hyponatremia 08/10/2013  . Left atrial enlargement 01/10/2013  . A-fib ([place]) 12/08/2012  . Rosacea 05/15/2012  . Anemia due to chronic blood loss 08/28/2010  . DEGENERATIVE DISC DISEASE, LUMBAR SPINE 07/14/2009  . GERD 07/11/2009  . PERSONAL HX COLONIC POLYPS 07/11/2009  . HYPERCHOLESTEROLEMIA 04/10/2006  . HYPERTENSION, BENIGN SYSTEMIC 04/10/2006  . History of pulmonary embolism 04/10/2006  . DIVERTICULOSIS OF COLON 04/10/2006  . ACTINIC KERATOSIS 04/10/2006  . GEN OSTEOARTHROSIS INVOLVING MULTIPLE SITES 04/10/2006      Expected Discharge Date: Expected Discharge Date: 08/27/17   Team Members Present: Physician leading conference: Dr. Alysia Penna Social Worker Present: Ovidio Kin, LCSW Nurse Present: Rozetta Nunnery, RN PT Present: Lavone Nian, PT OT Present: Simonne Come, OT SLP Present: Weston Anna, SLP PPS Coordinator present : Daiva Nakayama, RN, CRRN       Current Status/Progress Goal Weekly Team Focus  Medical     aphasic, Right hemiparesis, possible apraxia  maintain medical stability  improve communication   Bowel/Bladder     2 person with steady during the day and INC at night.   To maintain a stable bowel and bladder routine.   Continue bladder training as recommended while awake.    Swallow/Nutrition/ Hydration               ADL's     mod assist bathing, min assist UB dressing, total assist LB dressing, mod-max assist stand pivot transfers  Min assist overall  dynamic sitting and standing balance, ADL retraining, Rt attention, RUE NMR   Mobility     max assist sit>stand, mod assist stand pivot with RW, mod assist gait x 5 ft with RW & w/c follow, pt limited by significant cognitive  deficits & c/o pain but unable to describe where, poor endurance  min assist overall except supervision w/c mobility  transfers, gait, midline orientation, activity tolerance, balance, NMR, strengthening   Communication     Max  A  Min A  basic commands, naming common items, expressing wants/needs   Safety/Cognition/ Behavioral Observations   2 person assist with steady. Chair and bed alerm.   To reamin fall free.   Continue to encourage safety daily and as needed.    Pain     Recently recieved PRN for headache.   To maintain pain rate of 3 or below.   Continue to treat pain as needed.    Skin     INC resulting in redness.   To remain skin breakdown free.   Continue to apply barrier cream as needed and with every diaper change.      *See Care Plan and progress notes for long and short-term goals.      Barriers to Discharge   Current Status/Progress Possible Resolutions Date Resolved   Physician     Decreased caregiver support;Medical stability     progressing towards goals  cont Neuromuscular re education      Nursing                 PT        pt will need 24 hr assist upon d/c.           OT                 SLP            SW              Discharge Planning/Teaching Needs:  Probable going to SNF-Rehab at [facility] when leaves here to continue her rehab and recovery. Three son's very involved and here daily.      Team Discussion:  Goals min assist level, currently is mod/max level of assist. Tends to pushes to the right and has right leg pain-treating with tylenol. Motor planning and initiation issues. Speech improving. Timed toileting started with nursing for continence. Expressing basic wants and needs. Plan to go to [facility] rehab once leaves here.  Revisions to Treatment Plan:  DC 7/17-to [facility]    Continued Need for Acute Rehabilitation Level of Care: The patient requires daily medical management by a physician with specialized training in physical medicine and rehabilitation for the following conditions: Daily direction of a multidisciplinary physical rehabilitation program to ensure safe treatment while eliciting the highest outcome that is of practical value to the patient.:  Yes Daily analysis of laboratory values and/or radiology reports with any subsequent need for medication adjustment of medical intervention for : Neurological problems   Elease Hashimoto 08/13/2017, 1:24 PM                 Patient ID: [name], female   DOB: 10-27-1930, 84 y.o.   MRN: [id number]

## 2017-08-20 NOTE — Progress Notes (Signed)
Speech Language Pathology Daily Session Note  Patient Details  Name: TATYANA BIBER MRN: [id number] Date of Birth: February 06, 1931  Today's Date: 08/20/2017 SLP Individual Time: 6286-3817 SLP Individual Time Calculation (min): 23 min  Short Term Goals: Week 2: SLP Short Term Goal 1 (Week 2): Pt will recognize and correct verbal errors at the word level with min assist verbal cues.   SLP Short Term Goal 2 (Week 2): Pt will demonstrate selective attention in mildly distracting environment for ~ 15 minutes with Mod A cues.  SLP Short Term Goal 3 (Week 2): Pt will complete basic familiar tasks with supervision cues.  SLP Short Term Goal 4 (Week 2): Pt will scan to right of midline to locate objects in 8 of 10 opportunities with Supervision cues.  SLP Short Term Goal 5 (Week 2): Pt will verbalize at the phrase level during nonstructured tasks with mod assist multimodal cues.    Skilled Therapeutic Interventions:  Pt was seen for skilled ST targeting communication goals.  Pt was received sitting upright in wheelchair, resting with eyes closed but awakened easily to voice.  Pt initially answered "no," when she was offered SLP services; however, she was agreeable with minimal encouragement so I question the accuracy of her response.  Pt was able to verbalize at the phrase level to describe actions in pictures with mod assist verbal cues to recognize and correct perseverative errors.  Pt was left in wheelchair with chair alarm set and call bell within reach.  Continue per current plan of care.    Function:  Eating Eating                 Cognition Comprehension Comprehension assist level: Understands basic 75 - 89% of the time/ requires cueing 10 - 24% of the time  Expression   Expression assist level: Expresses basic 50 - 74% of the time/requires cueing 25 - 49% of the time. Needs to repeat parts of sentences.  Social Interaction Social Interaction assist level: Interacts appropriately 75 - 89%  of the time - Needs redirection for appropriate language or to initiate interaction.  Problem Solving Problem solving assist level: Solves basic 50 - 74% of the time/requires cueing 25 - 49% of the time  Memory Memory assist level: Recognizes or recalls 25 - 49% of the time/requires cueing 50 - 75% of the time    Pain Pain Assessment Pain Scale: 0-10 Pain Score: 0-No pain   Therapy/Group: Individual Therapy  Drea Jurewicz, Selinda Orion 08/20/2017, 3:36 PM

## 2017-08-20 NOTE — Progress Notes (Signed)
Speech Language Pathology Daily Session Note  Patient Details  Name: Sara Russell MRN: [id number] Date of Birth: 01-02-1931  Today's Date: 08/20/2017 SLP Individual Time: 7588-3254 SLP Individual Time Calculation (min): 35 min  Short Term Goals: Week 2: SLP Short Term Goal 1 (Week 2): Pt will recognize and correct verbal errors at the word level with min assist verbal cues.   SLP Short Term Goal 2 (Week 2): Pt will demonstrate selective attention in mildly distracting environment for ~ 15 minutes with Mod A cues.  SLP Short Term Goal 3 (Week 2): Pt will complete basic familiar tasks with supervision cues.  SLP Short Term Goal 4 (Week 2): Pt will scan to right of midline to locate objects in 8 of 10 opportunities with Supervision cues.  SLP Short Term Goal 5 (Week 2): Pt will verbalize at the phrase level during nonstructured tasks with mod assist multimodal cues.    Skilled Therapeutic Interventions:  Pt was seen for skilled ST targeting communication goals.  Pt was eating breakfast in bed upon therapist's arrival.  After nursing had administered morning medications, SLP and RN transferred pt to wheelchair via Stedy lift to maximize participation in therapies and safety during PO intake.  Pt needed mod assist verbal cues to initiate a verbal response in order to make her needs and wants known in the context of tray set up and self feeding of breakfast.  Pt selective attended to task in a mildly distracting environment for 15 minutes with min cues for redirection to task.  Pt was left in wheelchair with chair alarm set and call bell within reach.  Continue per current plan of care.   Function:  Eating Eating   Modified Consistency Diet: No Eating Assist Level: Supervision or verbal cues;Set up assist for   Eating Set Up Assist For: Opening containers       Cognition Comprehension Comprehension assist level: Understands basic 75 - 89% of the time/ requires cueing 10 - 24% of the time   Expression   Expression assist level: Expresses basic 50 - 74% of the time/requires cueing 25 - 49% of the time. Needs to repeat parts of sentences.  Social Interaction Social Interaction assist level: Interacts appropriately 75 - 89% of the time - Needs redirection for appropriate language or to initiate interaction.  Problem Solving Problem solving assist level: Solves basic 50 - 74% of the time/requires cueing 25 - 49% of the time  Memory Memory assist level: Recognizes or recalls 25 - 49% of the time/requires cueing 50 - 75% of the time    Pain Pain Assessment Pain Scale: 0-10 Pain Score: 0-No pain  Therapy/Group: Individual Therapy  Lovetta Condie, Elmyra Ricks L 08/20/2017, 11:00 AM

## 2017-08-20 NOTE — Progress Notes (Signed)
Occupational Therapy Session Note  Patient Details  Name: Sara Russell MRN: [id number] Date of Birth: 07-17-1930  Today's Date: 08/20/2017 OT Individual Time: 0354-6568 OT Individual Time Calculation (min): 27 min    Short Term Goals: Week 2:  OT Short Term Goal 1 (Week 2): Pt will complete BSC transfer with mod A  OT Short Term Goal 2 (Week 2): Pt will complete bathing with mod A at sit > stand level OT Short Term Goal 3 (Week 2): Pt will don pants with mod A OT Short Term Goal 4 (Week 2): Pt will utilize RUE as gross assist during self-care tasks with min cues  Skilled Therapeutic Interventions/Progress Updates:    Pt seen for OT session focusing on cognitive remediation, attention to R, and functional use of R UE. Pt received in therapy gym with hand off from OT, pt agreeable to tx session and easily transitioned to next task.  She completed peg board activity from w/c level, pegs placed on R side of pt to facilitate attention to R. She was able to replicate moderatley complex pattern with overall mod cuing, able to self correct errors in colors, however, with difficulty transitioning from one aspect of pattern to the next (I.e. Transitioning from vertical line to horizontal line). Once pattern completed, pt required to remove pegs using R UE. Demonstrates functional grasp needed to pick up and release pegs, however, ataxic and apraxic movements noted with task. Pt returned to room at end of session, agreeable to stay sitting up in w/c, chair belt on and all needs in reach. Pt oriented to "hospital" when given option of two.   Therapy Documentation Precautions:  Precautions Precautions: Fall Precaution Comments: watch BP / visual deficits; hx of spinal compression fxs (osteoporosis); L ankle fixed 2/2 ORIF 15 years ago Restrictions Weight Bearing Restrictions: No Pain: Pain Assessment Pain Scale: Faces Pain Score: 0-No pain   See Function Navigator for Current Functional  Status.   Therapy/Group: Individual Therapy  Li Bobo L 08/20/2017, 3:03 PM

## 2017-08-20 NOTE — Patient Care Conference (Signed)
Inpatient RehabilitationTeam Conference and Plan of Care Update Date: 08/20/2017   Time: 10:35 AM    Patient Name: Sara Russell      Medical Record Number: [id number]  Date of Birth: March 12, 1930 Sex: Female         Room/Bed: 4W20C/4W20C-01 Payor Info: Payor: MEDICARE / Plan: MEDICARE PART A AND B / Product Type: *No Product type* /    Admitting Diagnosis: Left thalamus hemorrhage  Admit Date/Time:  08/06/2017  3:56 PM Admission Comments: No comment available   Primary Diagnosis:  <principal problem not specified> Principal Problem: <principal problem not specified>  Patient Active Problem List   Diagnosis Date Noted  . Acute blood loss anemia   . Renal insufficiency   . Essential hypertension   . Labile blood pressure   . Hypokalemia   . Thalamic hemorrhage ([place]) 08/06/2017  . Hemorrhagic stroke ([place])   . Hyperlipidemia   . PAF (paroxysmal atrial fibrillation) ([place])   . Benign essential HTN   . Stage 3 chronic kidney disease ([place])   . ICH (intracerebral hemorrhage) ([place]) 08/03/2017  . At high risk for falls 02/12/2017  . Insomnia 02/12/2017  . Pulmonary nodules/lesions, multiple 01/29/2017  . Acute pain of right shoulder 01/28/2017  . Hypothyroidism 04/04/2015  . Compression fracture of L1 lumbar vertebra (HCC) 02/23/2015  . Cholelithiases 02/23/2015  . Encounter for palliative care   . Chronic diastolic congestive heart failure ([place])   . Osteoporosis 05/13/2014  . Gout 08/16/2013  . Hyponatremia 08/10/2013  . Left atrial enlargement 01/10/2013  . A-fib ([place]) 12/08/2012  . Rosacea 05/15/2012  . Anemia due to chronic blood loss 08/28/2010  . DEGENERATIVE DISC DISEASE, LUMBAR SPINE 07/14/2009  . GERD 07/11/2009  . PERSONAL HX COLONIC POLYPS 07/11/2009  . HYPERCHOLESTEROLEMIA 04/10/2006  . HYPERTENSION, BENIGN SYSTEMIC 04/10/2006  . History of pulmonary embolism 04/10/2006  . DIVERTICULOSIS OF COLON 04/10/2006  . ACTINIC KERATOSIS 04/10/2006  . GEN OSTEOARTHROSIS  INVOLVING MULTIPLE SITES 04/10/2006    Expected Discharge Date: Expected Discharge Date: 08/27/17  Team Members Present: Physician leading conference: Dr. Alysia Penna Social Worker Present: Ovidio Kin, LCSW Nurse Present: Other (comment)(Kayla Mabe-LPN) PT Present: Michaelene Song, PT OT Present: Benay Pillow, OT SLP Present: Windell Moulding, SLP PPS Coordinator present : Daiva Nakayama, RN, CRRN     Current Status/Progress Goal Weekly Team Focus  Medical   Urinary retention allergy to sulfa limits use of Flomax, trial of Urecholine  Maintain medical stability  Decreased flexor withdrawal spasticity   Bowel/Bladder   INC of bowel and bladder. In and out cath are being preformed once or twice each shift.   Pt is to completely empty bladder without assistance with in 30 days.   Cont bladder training as recommended while awake.    Swallow/Nutrition/ Hydration             ADL's   mod assist bathing, Setup UB dressing, total assist LB dressing, mod assist stand pivot transfers.  Pt with increased pain and spasms in RLE impeding progress and participation in therapies  Min assist overall  ADL retraining, dynamic sitting and standing balance, LB dressing, RUE NMR   Mobility   mod assist for sit<>stand, mod assist stand pivot with RW and mod assist up to 15 ft with RW, very fatigued and poor endurance  min assist overall except supervision w/c mobility  transfers, gait, NMR, activity tolerance, strengthening, endurance    Communication   Mod A  Min A   functional communication of  needs and wants    Safety/Cognition/ Behavioral Observations  mod-max assist   min assist   attention, problem solving, safety awareness    Pain   Pain in right lower leg and groin.   To have pain rated at a 3 or below.   Contionue to monitor pain, treat as prescribed and report concerns or change immediately.    Skin   Groin and buttlock red and swollen.   No skin breakdown.  Continue to apply barrier cream  with each diaper change.       *See Care Plan and progress notes for long and short-term goals.     Barriers to Discharge  Current Status/Progress Possible Resolutions Date Resolved   Physician    Neurogenic Bowel & Bladder     Progress towards goals  Continue rehabilitation program, spasticity management with medications both oral and possibly IM      Nursing                  PT                    OT                  SLP                SW                Discharge Planning/Teaching Needs:  Working with [facility] on transfer to their rehab next week 7/17. Son's here daily and participate in her care.      Team Discussion:  Making slow progress in her therapies, able to speak more and express her wants and basic needs. L-LE pain is limiting her in therapies and her participation-MD addressing this. Still being I & O cath and incontinent of bowel. Nursing to try timed toileting. Working on transition to [facility] rehab next [employer].  Revisions to Treatment Plan:  Transfer to [facility] rehab 7/17    Continued Need for Acute Rehabilitation Level of Care: The patient requires daily medical management by a physician with specialized training in physical medicine and rehabilitation for the following conditions: Daily direction of a multidisciplinary physical rehabilitation program to ensure safe treatment while eliciting the highest outcome that is of practical value to the patient.: Yes Daily medical management of patient stability for increased activity during participation in an intensive rehabilitation regime.: Yes Daily analysis of laboratory values and/or radiology reports with any subsequent need for medication adjustment of medical intervention for : Neurological problems  Elease Hashimoto 08/20/2017, 2:33 PM

## 2017-08-20 NOTE — Progress Notes (Addendum)
Social Work Patient ID: Sara Russell, female   DOB: Feb 20, 1930, 84 y.o.   MRN: [id number]  Met with pt and e-mailed son regarding team conference progress toward her goals and the plan still being going to [facility] next Wed. Have spoken with Butch Penny at [facility] regarding the target date and plan. All in agreement with the plan.

## 2017-08-20 NOTE — Progress Notes (Signed)
Occupational Therapy Session Note  Patient Details  Name: Sara Russell MRN: [id number] Date of Birth: 18-Apr-1930  Today's Date: 08/20/2017 OT Individual Time: 1400-1430 OT Individual Time Calculation (min): 30 min    Short Term Goals: Week 2:  OT Short Term Goal 1 (Week 2): Pt will complete BSC transfer with mod A  OT Short Term Goal 2 (Week 2): Pt will complete bathing with mod A at sit > stand level OT Short Term Goal 3 (Week 2): Pt will don pants with mod A OT Short Term Goal 4 (Week 2): Pt will utilize RUE as gross assist during self-care tasks with min cues  Skilled Therapeutic Interventions/Progress Updates:    Pt resting in bed upon arrival and agreeable to participating in therapy. Pt required min A for bed mobility supine>sit EOB.  Pt required min A for stand pivot transfer to w/c.  Pt transitioned therapy gym and engaged in [place] with focus on shoulder flexion, abduction, and adduction.  Elbow flexion/extension noted WFL.  Pt with RUE shoulder PROM at ~120 degrees but fatigues quickly with AROM and AAROM. Pt remained in w/c for handoff to OTR for next therapy session.   Therapy Documentation Precautions:  Precautions Precautions: Fall Precaution Comments: watch BP / visual deficits; hx of spinal compression fxs (osteoporosis); L ankle fixed 2/2 ORIF 15 years ago Restrictions Weight Bearing Restrictions: No Pain: Pain Assessment Pain Scale: Faces Pain Score: 0-No pain Faces Pain Scale: Hurts little more Pain Location: Leg Pain Orientation: Right;Lower Pain Descriptors / Indicators: Cramping;Spasm Pain Frequency: Intermittent Pain Onset: On-going Patients Stated Pain Goal: 0 Pain Intervention(s): Medication (See eMAR)  See Function Navigator for Current Functional Status.   Therapy/Group: Individual Therapy  Leroy Libman 08/20/2017, 2:52 PM

## 2017-08-20 NOTE — Progress Notes (Signed)
[place] PHYSICAL MEDICINE & REHABILITATION     PROGRESS NOTE    Subjective/Complaints:  Per RN pt had less spasms last noc.  Not sedated.  Remains aphasic  ROS: limited due to cognition/aphasia  Objective:  No results found. No results for input(s): WBC, HGB, HCT, PLT in the last 72 hours. No results for input(s): NA, K, CL, GLUCOSE, BUN, CREATININE, CALCIUM in the last 72 hours.  Invalid input(s): CO CBG (last 3)  No results for input(s): GLUCAP in the last 72 hours.  Wt Readings from Last 3 Encounters:  08/20/17 68.1 kg (150 lb 2.1 oz)  08/02/17 70.1 kg (154 lb 8.7 oz)  05/05/17 70.3 kg (155 lb)     Intake/Output Summary (Last 24 hours) at 08/20/2017 0800 Last data filed at 08/19/2017 1840 Gross per 24 hour  Intake 480 ml  Output 565 ml  Net -85 ml    Vital Signs: Blood pressure 140/65, pulse 62, temperature 98.9 F (37.2 C), temperature source Oral, resp. rate 18, weight 68.1 kg (150 lb 2.1 oz), SpO2 96 %. Physical Exam:  Constitutional: No distress . Vital signs reviewed. HENT: Normocephalic.  Atraumatic. Eyes: EOMI. No discharge. Cardiovascular: RRR. No JVD. Respiratory: CTA Bilaterally. Normal effort. GI: BS +. Non-distended. Musc: No edema or tenderness in extremities. Neurological: She is alert  Global aphasia Right central 7 Dysarthria Motor RUE 3-/5 proximal to distal RLE: 3+ to 4-/5 HF, 4/5 distally LUE and LLE 4 to 4+/5. Apraxic Skin: Skin is warm and dry.  Psychiatric: pleasant and cooperative  Assessment/Plan: 1. Functional deficits secondary to left thalamic/internal capsule hemorrhage which require 3+ hours per day of interdisciplinary therapy in a comprehensive inpatient rehab setting. Physiatrist is providing close team supervision and 24 hour management of active medical problems listed below. Physiatrist and rehab team continue to assess barriers to discharge/monitor patient progress toward functional and medical  goals.  Function:  Bathing Bathing position   Position: Wheelchair/chair at sink  Bathing parts Body parts bathed by patient: Right arm, Chest, Abdomen, Right upper leg, Left upper leg, Left arm, Front perineal area Body parts bathed by helper: Buttocks, Right lower leg, Left lower leg, Back  Bathing assist Assist Level: Touching or steadying assistance(Pt > 75%)      Upper Body Dressing/Undressing Upper body dressing   What is the patient wearing?: Pull over shirt/dress     Pull over shirt/dress - Perfomed by patient: Thread/unthread left sleeve, Put head through opening, Pull shirt over trunk, Thread/unthread right sleeve Pull over shirt/dress - Perfomed by helper: Thread/unthread right sleeve        Upper body assist Assist Level: Supervision or verbal cues, More than reasonable time      Lower Body Dressing/Undressing Lower body dressing   What is the patient wearing?: Pants, Underwear       Pants- Performed by helper: Thread/unthread right pants leg, Thread/unthread left pants leg, Pull pants up/down   Non-skid slipper socks- Performed by helper: Don/doff right sock, Don/doff left sock                  Lower body assist Assist for lower body dressing: Touching or steadying assistance (Pt > 75%)      Toileting Toileting Toileting activity did not occur: No continent bowel/bladder event   Toileting steps completed by helper: Adjust clothing prior to toileting, Performs perineal hygiene, Adjust clothing after toileting Toileting Assistive Devices: Other (comment)(stedy)  Toileting assist Assist level: Touching or steadying assistance (Pt.75%)   Transfers Chair/bed  transfer   Chair/bed transfer method: Other Chair/bed transfer assist level: Moderate assist (Pt 50 - 74%/lift or lower) Chair/bed transfer assistive device: Mechanical lift Mechanical lift: Stedy   Locomotion Ambulation Ambulation activity did not occur: (P) Safety/medical concerns   Max  distance: 69f Assist level: Moderate assist (Pt 50 - 74%)   Wheelchair   Type: Manual Max wheelchair distance: 100 ft Assist Level: Moderate assistance (Pt 50 - 74%)  Cognition Comprehension Comprehension assist level: Understands basic 50 - 74% of the time/ requires cueing 25 - 49% of the time, Understands basic 25 - 49% of the time/ requires cueing 50 - 75% of the time  Expression Expression assist level: Expresses basic 50 - 74% of the time/requires cueing 25 - 49% of the time. Needs to repeat parts of sentences.  Social Interaction Social Interaction assist level: Interacts appropriately 50 - 74% of the time - May be physically or verbally inappropriate.  Problem Solving Problem solving assist level: Solves basic 25 - 49% of the time - needs direction more than half the time to initiate, plan or complete simple activities  Memory Memory assist level: Recognizes or recalls 25 - 49% of the time/requires cueing 50 - 75% of the time   Medical Problem List and Plan: 1.  Right side weakness with aphasia secondary to left thalamic/internal capsule hemorrhage, subcortical infarcts left frontal and parietal-occipital lobes felt to be secondary to uncontrolled HTN, use of anticoagulation  -continue CIR PT, OT, SLP, Team conference today please see physician documentation under team conference tab, met with team face-to-face to discuss problems,progress, and goals. Formulized individual treatment plan based on medical history, underlying problem and comorbidities.                                2.  DVT Prophylaxis/Anticoagulation: SCDs. 3. Pain Management: Tylenol as needed 4. Mood: Provide emotional support 5. Neuropsych: This patient is not capable of making decisions on her own behalf. 6. Skin/Wound Care: Routine skin checks 7. Fluids/Electrolytes/Nutrition: encourage PO intake  -hypokalemia: resolved   -kdur to 246m bid   -recheck 7/1 was 4.4 8.  Atrial fibrillation.  Amiodarone 100 mg daily.   Cardiac rate controlled.  Eliquis on hold due to  hemorrhage 9.  Hypertension.  Coreg 12.5 mg twice daily, increased coreg to 18.757mid   Vitals:   08/19/17 2018 08/20/17 0252  BP: 103/61 140/65  Pulse: (!) 57 62  Resp: 18 18  Temp: 99 F (37.2 C) 98.9 F (37.2 C)  SpO2: 100% 96%  Controlled 7/10 10.  Chronic diastolic congestive heart failure.  Monitor for signs of fluid overload Filed Weights   08/18/17 0330 08/19/17 0500 08/20/17 0252  Weight: 69.5 kg (153 lb 3.5 oz) 68 kg (149 lb 14.6 oz) 68.1 kg (150 lb 2.1 oz)   Stable on 7/10 11.  Hypothyroidism.  Synthroid 12.  Renal insufficiency.  Baseline creatinine 1.39.  Creatinine 1.04 on 6/28, 1.21 on 7/1, recheck in am  Continue to monitor, I [id number].  Hyperlipidemia.  Lipitor 14. Acute blood loss anemia  Hemoglobin 11.2 on 6/27, 11.9 upon admit to acute, stable at 11.4  Continue to monitor 15.  FLexor wtihdrawl spasms, will increase tizanidine 2mg 64m , monitor for sedation LOS (Days) 14 A [place], MD 08/20/2017 8:00 AM

## 2017-08-20 NOTE — Plan of Care (Signed)
  Problem: Consults Goal: RH STROKE PATIENT EDUCATION Description See Patient Education module for education specifics  Outcome: Progressing   Problem: RH BOWEL ELIMINATION Goal: RH STG MANAGE BOWEL WITH ASSISTANCE Description STG Manage Bowel with Mod. Assistance.  Outcome: Progressing   Problem: RH SKIN INTEGRITY Goal: RH STG MAINTAIN SKIN INTEGRITY WITH ASSISTANCE Description STG Maintain Skin Integrity With Mod Assistance.  Outcome: Progressing   Problem: RH SAFETY Goal: RH STG ADHERE TO SAFETY PRECAUTIONS W/ASSISTANCE/DEVICE Description STG Adhere to Safety Precautions With Mod Assistance/Device.  Outcome: Progressing Goal: RH STG DECREASED RISK OF FALL WITH ASSISTANCE Description STG Decreased Risk of Fall With Mod Assistance.  Outcome: Progressing   Problem: RH COGNITION-NURSING Goal: RH STG USES MEMORY AIDS/STRATEGIES W/ASSIST TO PROBLEM SOLVE Description STG Uses Memory Aids/Strategies With Mod Assistance to Problem Solve.  Outcome: Progressing   Problem: RH PAIN MANAGEMENT Goal: RH STG PAIN MANAGED AT OR BELOW PT'S PAIN GOAL Description Less than 3,on 1 to 10 scale  Outcome: Progressing   Problem: RH BLADDER ELIMINATION Goal: RH STG MANAGE BLADDER WITH ASSISTANCE Description STG Manage Bladder With Mod. Assistance  Outcome: Not Progressing

## 2017-08-21 ENCOUNTER — Inpatient Hospital Stay (HOSPITAL_COMMUNITY): Payer: Medicare Other | Admitting: Occupational Therapy

## 2017-08-21 ENCOUNTER — Inpatient Hospital Stay (HOSPITAL_COMMUNITY): Payer: Medicare Other | Admitting: Physical Therapy

## 2017-08-21 ENCOUNTER — Inpatient Hospital Stay (HOSPITAL_COMMUNITY): Payer: Medicare Other | Admitting: Speech Pathology

## 2017-08-21 NOTE — Progress Notes (Signed)
Physical Therapy Weekly Progress Note  Patient Details  Name: Sara Russell MRN: [id number] Date of Birth: 1930/09/08  Beginning of progress report period: August 13, 2017 End of progress report period: August 21, 2017  Today's Date: 08/21/2017    Patient has met 0 of 3 short term goals.  Pt is making slow progress towards goals as pt is limited by fatigue & pain. Pt is also limited by cognitive & communication deficits (receptive & expressive aphasia). Pt is able to ambulate short distances with RW and mod/min assist and complete stand pivot transfers with mod assist. Pt would benefit from continued skilled PT treatment to focus on strengthening, endurance training, NMR, gait, balance, transfers, and w/c mobility to increase independence with all functional mobility.  Patient continues to demonstrate the following deficits muscle weakness, decreased cardiorespiratoy endurance, decreased coordination, decreased initiation, decreased attention, decreased awareness, decreased problem solving, decreased safety awareness, decreased memory and delayed processing, and decreased standing balance, decreased postural control, hemiplegia and decreased balance strategies and therefore will continue to benefit from skilled PT intervention to increase functional independence with mobility.  Patient progressing toward long term goals..  Continue plan of care.  PT Short Term Goals Week 2:  PT Short Term Goal 1 (Week 2): Pt will propel w/c 150 ft with min assist. PT Short Term Goal 1 - Progress (Week 2): Not met PT Short Term Goal 2 (Week 2): Pt will ambulate 25 ft with LRAD & mod assist. PT Short Term Goal 2 - Progress (Week 2): Progressing toward goal PT Short Term Goal 3 (Week 2): Pt will perform bed mobility with hospital bed features & min assist. PT Short Term Goal 3 - Progress (Week 2): Partly met Week 3:  PT Short Term Goal 1 (Week 3): STG = LTG due to estimated d/c date.   Therapy  Documentation Precautions:  Precautions Precautions: Fall Precaution Comments: watch BP / visual deficits; hx of spinal compression fxs (osteoporosis); L ankle fixed 2/2 ORIF 15 years ago Restrictions Weight Bearing Restrictions: No   See Function Navigator for Current Functional Status.  Therapy/Group: Individual Therapy  Waunita Schooner 08/21/2017, 7:50 AM

## 2017-08-21 NOTE — Progress Notes (Signed)
Nutrition Follow-up  DOCUMENTATION CODES:   Not applicable  INTERVENTION:  Continue Ensure Enlive po BID, each supplement provides 350 kcal and 20 grams of protein.  Continue 30 ml Prostat po once daily, each supplement provides 100 kcal and 15 grams of protein.   Encourage adequate PO intake.   NUTRITION DIAGNOSIS:   Increased nutrient needs related to chronic illness(CHF) as evidenced by estimated needs; ongoing  GOAL:   Patient will meet greater than or equal to 90% of their needs; progressing  MONITOR:   PO intake, Supplement acceptance, Labs, Weight trends, I & O's, Skin  REASON FOR ASSESSMENT:   Malnutrition Screening Tool    ASSESSMENT:    84 year old right-handed female with history of mild renal insufficiency with creatinine 6.48, chronic diastolic congestive heart failure, atrial fibrillation maintained on amiodarone as well as Eliquis, DVT/PE, hypertension. Presented 08/02/2017 with right sided weakness, facial droop and aphasia.  Blood pressure 221/89.  MRI reviewed showed left thalamic hemorrhage.   Meal completion has been 25-60% with 50% at lunch. Pt currently has Ensure and prostat ordered and has been consuming them. RD to continue with current orders to aid in caloric and protein needs.   Diet Order:   Diet Order           Diet regular Room service appropriate? Yes; Fluid consistency: Thin  Diet effective now          EDUCATION NEEDS:   Not appropriate for education at this time  Skin:  Skin Assessment: Reviewed RN Assessment  Last BM:  7/10  Height:   Ht Readings from Last 1 Encounters:  08/02/17 5' 4"  (1.626 m)    Weight:   Wt Readings from Last 1 Encounters:  08/21/17 150 lb 5.7 oz (68.2 kg)    Ideal Body Weight:  54.5 kg  BMI:  Body mass index is 25.81 kg/m.  Estimated Nutritional Needs:   Kcal:  1650-1800  Protein:  70-80 grams  Fluid:  Per MD    Corrin Parker, MS, RD, LDN Pager # [phone] After hours/ weekend  pager # [phone]

## 2017-08-21 NOTE — Progress Notes (Signed)
Occupational Therapy Weekly Progress Note  Patient Details  Name: Sara Russell MRN: [id number] Date of Birth: 1930/06/29  Beginning of progress report period: August 14, 2017 End of progress report period: August 21, 2017  Today's Date: 08/21/2017 OT Individual Time: 1105-1200 and 1401-1431 OT Individual Time Calculation (min): 55 min and 30 min   Patient has met 4 of 4 short term goals.  Pt is making steady progress towards goals despite limited participation this week due to RLE pain and spasms.  Pt currently requires min-mod assist for stand pivot transfers and has progressed to min assist for sit > stand.  Pt is demonstrating improved initiation and functional use of RUE during bathing and dressing tasks.    Patient continues to demonstrate the following deficits: muscle weakness,decreased coordination and decreased motor planning,decreased midline orientation, decreased attention to right and decreased motor planning,decreased initiation, decreased attention, decreased awareness, decreased problem solving, decreased safety awareness, decreased memory and delayed processing,peripheraland decreased sitting balance, decreased standing balance, decreased postural control, hemiplegia and decreased balance strategies and therefore will continue to benefit from skilled OT intervention to enhance overall performance with BADL and Reduce care partner burden.  Patient progressing toward long term goals..  Continue plan of care.  OT Short Term Goals Week 2:  OT Short Term Goal 1 (Week 2): Pt will complete BSC transfer with mod A  OT Short Term Goal 1 - Progress (Week 2): Met OT Short Term Goal 2 (Week 2): Pt will complete bathing with mod A at sit > stand level OT Short Term Goal 2 - Progress (Week 2): Met OT Short Term Goal 3 (Week 2): Pt will don pants with mod A OT Short Term Goal 3 - Progress (Week 2): Met OT Short Term Goal 4 (Week 2): Pt will utilize RUE as gross assist during self-care  tasks with min cues OT Short Term Goal 4 - Progress (Week 2): Met Week 3:  OT Short Term Goal 1 (Week 3): STG = LTGs due to remaining LOS  Skilled Therapeutic Interventions/Progress Updates:    1) Treatment session with focus on ADL retraining and increased functional use of RUE.  Pt received upright in w/c willing to engage in bathing and dressing tasks.  Bathing completed at sit > stand level at sink with pt demonstrating increased initiation and sequencing with bathing, pt even utilizing RUE to wash Lt arm and chest.  Pt with one instance of dropping wash cloth without awareness, continuing to wash with just hand.  Improved sit > stand for therapist to assist with washing buttocks.  Pt donned shirt with setup assist this session and demonstrated ability to thread BLE into pants with only setup assist.  Therapist assisted with pulling pants over hips while pt maintained standing with UE support on sink.  Pt completed grooming tasks of oral care and brushing hair with setup of items.  Engaged in [place] with stringing of beads.  Pt held thread in Rt hand and maneuvered thread with Rt hand.  Encouraged pt to pick up beads with Rt hand, requiring increased time.  Pt left upright in w/c with seat belt alarm on and all needs in reach.  2) Treatment session with focus on sit > stand, standing balance, and functional use of RUE.  Engaged in sit > stand at high-low table with pt able to complete with min assist/tactile cues.  Engaged in color sorting task with max cues to attempt to utilize RUE, pt unable to sort items while  standing with use of Rt hand.  Pt reports activity was "too much".  In sitting, pt able to pick up items with Rt hand and place them in container at lap level with increased time and cues to continue to utilize RUE.  Returned to room and transferred back to bed with mod assist stand pivot transfer.    Therapy Documentation Precautions:  Precautions Precautions: Fall Precaution Comments:  watch BP / visual deficits; hx of spinal compression fxs (osteoporosis); L ankle fixed 2/2 ORIF 15 years ago Restrictions Weight Bearing Restrictions: No Pain:  Pt with no c/o pain  See Function Navigator for Current Functional Status.   Therapy/Group: Individual Therapy  Simonne Come 08/21/2017, 3:02 PM

## 2017-08-21 NOTE — Progress Notes (Signed)
Speech Language Pathology Weekly Progress and Session Note  Patient Details  Name: Sara Russell MRN: [id number] Date of Birth: [birth year]  Beginning of progress report period: August 14, 2017 End of progress report period: August 21, 2017  Today's Date: 08/21/2017 SLP Individual Time: 1308-6578 SLP Individual Time Calculation (min): 60 min  Short Term Goals: Week 2: SLP Short Term Goal 1 (Week 2): Pt will recognize and correct verbal errors at the word level with min assist verbal cues.   SLP Short Term Goal 1 - Progress (Week 2): Discontinued (comment)(unable to demosntrate ability to self-correct) SLP Short Term Goal 2 (Week 2): Pt will demonstrate selective attention in mildly distracting environment for ~ 15 minutes with Mod A cues.  SLP Short Term Goal 2 - Progress (Week 2): Met SLP Short Term Goal 3 (Week 2): Pt will complete basic familiar tasks with supervision cues.  SLP Short Term Goal 3 - Progress (Week 2): Not met SLP Short Term Goal 4 (Week 2): Pt will scan to right of midline to locate objects in 8 of 10 opportunities with Supervision cues.  SLP Short Term Goal 4 - Progress (Week 2): Not met SLP Short Term Goal 5 (Week 2): Pt will verbalize at the phrase level during nonstructured tasks with mod assist multimodal cues.   SLP Short Term Goal 5 - Progress (Week 2): Not met    New Short Term Goals: Week 3: SLP Short Term Goal 1 (Week 3): Pt will demonstrate selective attention in mildly distracting environment for ~ 30 minutes with Mod A cues.  SLP Short Term Goal 2 (Week 3): Pt will complete basic familiar tasks with supervision cues.  SLP Short Term Goal 3 (Week 3): Pt will scan to right of midline to locate objects in 8 of 10 opportunities with Supervision cues.  SLP Short Term Goal 4 (Week 3): Pt will verbalize at the phrase level during nonstructured tasks with mod assist multimodal cues.    Weekly Progress Updates: Pt has made slow progress this reporting period and as a  result, she has increased her ability to produce some phrase length utterances but continues to be very inconsistent in naming familiar objects/people (her son). Pt continues to displayed decreased ability to answer yes/no questions or demonstrate comprehension of basic information. As a result, pt's LTGs have been downgraded to Mod A.      Intensity: Minumum of 1-2 x/day, 30 to 90 minutes Frequency: 3 to 5 out of 7 days Duration/Length of Stay: 7/18 Treatment/Interventions: Cueing hierarchy;Speech/Language facilitation;Therapeutic Activities;Patient/family education;Functional tasks;Cognitive remediation/compensation   Daily Session  Skilled Therapeutic Interventions: Skilled treatment session focused on cognition. SLP facilitated session by asking yes/no questions regarding tray set-up for breakfast. Pt's answers didn't always correspond to her actions (stated not to bread but ate it). Pt continues to be easily distracted by environment. She was not able to self-correct perseverative responses. Pt was returned to room, left upright in wheelchair, chair/lap belt alarm in place and all needs within reach.      Function:   Eating Eating   Modified Consistency Diet: No Eating Assist Level: Supervision or verbal cues;Set up assist for   Eating Set Up Assist For: Opening containers Helper Scoops Food on Utensil: Occasionally     Cognition Comprehension Comprehension assist level: Understands basic 50 - 74% of the time/ requires cueing 25 - 49% of the time;Understands basic 75 - 89% of the time/ requires cueing 10 - 24% of the time  Expression   Expression  assist level: Expresses basic 25 - 49% of the time/requires cueing 50 - 75% of the time. Uses single words/gestures.;Expresses basic 50 - 74% of the time/requires cueing 25 - 49% of the time. Needs to repeat parts of sentences.  Social Interaction Social Interaction assist level: Interacts appropriately 75 - 89% of the time - Needs  redirection for appropriate language or to initiate interaction.  Problem Solving Problem solving assist level: Solves basic 50 - 74% of the time/requires cueing 25 - 49% of the time  Memory Memory assist level: Recognizes or recalls 25 - 49% of the time/requires cueing 50 - 75% of the time   General    Pain    Therapy/Group: Individual Therapy  Sara Russell 08/21/2017, 1:43 PM

## 2017-08-21 NOTE — Progress Notes (Signed)
Physical Therapy Session Note  Patient Details  Name: Sara Russell MRN: [id number] Date of Birth: 1930-08-22  Today's Date: 08/21/2017 PT Individual Time: 1307-1401 PT Individual Time Calculation (min): 54 min   Short Term Goals: Week 3:  PT Short Term Goal 1 (Week 3): STG = LTG due to estimated d/c date.  Skilled Therapeutic Interventions/Progress Updates:  Pt received in w/c reporting incontinence upon questioning. Transported pt into bathroom and pt completed w/c<>elevated toilet transfer with min assist and use of grab bar. Pt with incontinent BM & void and additional continent void on toilet. Pt required assistance for clothing management and posterior peri hygiene with pt performing frontal peri hygiene. At sink pt performed hand hygiene from w/c level with max cuing to turn on water as pt attempted to use only soap. Therapist donned ted hose & tennis shoes total assist. Pt propelled w/c 50 ft with BLE, pillow positioned behind pt's back, and max cuing for "left, right" sequencing. Pt requires max cuing to attend to objects on L/R, supervision for w/c propulsion for linear path but max assist for turning. Pt requires max multimodal cuing & education for hand placement for sit<>stand transfers but with poor carryover. In gym pt ambulates 15 ft with RW, R hand orthosis, and min/mod assist. Pt requires intermittent assistance for RW management and anterior pelvic rotation to assist with RLE advancement. Pt with decreased weight shifting L due to pushing R, decreased step length BLE, decreased stride length, and significantly decreased gait speed. Upon sit>stand transfer pt with behaviors demonstrating pain in RLE but therapist repositioned LE and pt reports improvement. At end of session pt left sitting in w/c in room with alarm belt donned & all needs within reach.  Therapy Documentation Precautions:  Precautions Precautions: Fall Precaution Comments: watch BP / visual deficits; hx of spinal  compression fxs (osteoporosis); L ankle fixed 2/2 ORIF 15 years ago Restrictions Weight Bearing Restrictions: No  See Function Navigator for Current Functional Status.   Therapy/Group: Individual Therapy  Waunita Schooner 08/21/2017, 2:05 PM

## 2017-08-21 NOTE — Progress Notes (Signed)
[facility] MEDICINE & REHABILITATION     PROGRESS NOTE    Subjective/Complaints:  Per therapy, RLE spasms interfere with activity  ROS: limited due to cognition/aphasia  Objective:  No results found. No results for input(s): WBC, HGB, HCT, PLT in the last 72 hours. No results for input(s): NA, K, CL, GLUCOSE, BUN, CREATININE, CALCIUM in the last 72 hours.  Invalid input(s): CO CBG (last 3)  No results for input(s): GLUCAP in the last 72 hours.  Wt Readings from Last 3 Encounters:  08/21/17 68.2 kg (150 lb 5.7 oz)  08/02/17 70.1 kg (154 lb 8.7 oz)  05/05/17 70.3 kg (155 lb)     Intake/Output Summary (Last 24 hours) at 08/21/2017 0758 Last data filed at 08/21/2017 0400 Gross per 24 hour  Intake 680 ml  Output 1700 ml  Net -1020 ml    Vital Signs: Blood pressure (!) 175/64, pulse (!) 57, temperature 98 F (36.7 C), temperature source Oral, resp. rate 18, weight 68.2 kg (150 lb 5.7 oz), SpO2 98 %. Physical Exam:  Constitutional: No distress . Vital signs reviewed. HENT: Normocephalic.  Atraumatic. Eyes: EOMI. No discharge. Cardiovascular: RRR. No JVD. Respiratory: CTA Bilaterally. Normal effort. GI: BS +. Non-distended. Musc: No edema or tenderness in extremities. Neurological: She is alert  Global aphasia Right central 7 Dysarthria Motor RUE 3-/5 proximal to distal RLE: 3+ to 4-/5 HF, 4/5 distally LUE and LLE 4 to 4+/5. Apraxic Skin: Skin is warm and dry.  Psychiatric: pleasant and cooperative  Assessment/Plan: 1. Functional deficits secondary to left thalamic/internal capsule hemorrhage which require 3+ hours per day of interdisciplinary therapy in a comprehensive inpatient rehab setting. Physiatrist is providing close team supervision and 24 hour management of active medical problems listed below. Physiatrist and rehab team continue to assess barriers to discharge/monitor patient progress toward functional and medical  goals.  Function:  Bathing Bathing position   Position: Wheelchair/chair at sink  Bathing parts Body parts bathed by patient: Right arm, Chest, Abdomen, Right upper leg, Left upper leg, Left arm, Front perineal area Body parts bathed by helper: Buttocks, Right lower leg, Left lower leg, Back  Bathing assist Assist Level: Touching or steadying assistance(Pt > 75%)      Upper Body Dressing/Undressing Upper body dressing   What is the patient wearing?: Pull over shirt/dress     Pull over shirt/dress - Perfomed by patient: Thread/unthread left sleeve, Put head through opening, Pull shirt over trunk, Thread/unthread right sleeve Pull over shirt/dress - Perfomed by helper: Thread/unthread right sleeve        Upper body assist Assist Level: Supervision or verbal cues, More than reasonable time      Lower Body Dressing/Undressing Lower body dressing   What is the patient wearing?: Pants, Underwear       Pants- Performed by helper: Thread/unthread right pants leg, Thread/unthread left pants leg, Pull pants up/down   Non-skid slipper socks- Performed by helper: Don/doff right sock, Don/doff left sock                  Lower body assist Assist for lower body dressing: Touching or steadying assistance (Pt > 75%)      Toileting Toileting Toileting activity did not occur: No continent bowel/bladder event   Toileting steps completed by helper: Adjust clothing prior to toileting, Performs perineal hygiene, Adjust clothing after toileting Toileting Assistive Devices: Other (comment)(stedy)  Toileting assist Assist level: Touching or steadying assistance (Pt.75%)   Transfers Chair/bed transfer   Chair/bed transfer  method: Other Chair/bed transfer assist level: Moderate assist (Pt 50 - 74%/lift or lower) Chair/bed transfer assistive device: Mechanical lift Mechanical lift: Stedy   Locomotion Ambulation Ambulation activity did not occur: (P) Safety/medical concerns   Max  distance: 40f Assist level: Moderate assist (Pt 50 - 74%)   Wheelchair   Type: Manual Max wheelchair distance: 100 ft Assist Level: Moderate assistance (Pt 50 - 74%)  Cognition Comprehension Comprehension assist level: Understands basic 75 - 89% of the time/ requires cueing 10 - 24% of the time  Expression Expression assist level: Expresses basic 50 - 74% of the time/requires cueing 25 - 49% of the time. Needs to repeat parts of sentences.  Social Interaction Social Interaction assist level: Interacts appropriately 75 - 89% of the time - Needs redirection for appropriate language or to initiate interaction.  Problem Solving Problem solving assist level: Solves basic 50 - 74% of the time/requires cueing 25 - 49% of the time  Memory Memory assist level: Recognizes or recalls 25 - 49% of the time/requires cueing 50 - 75% of the time   Medical Problem List and Plan: 1.  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  DVT Prophylaxis/Anticoagulation: SCDs. 3. Pain Management: Tylenol as needed 4. Mood: Provide emotional support 5. Neuropsych: This patient is not capable of making decisions on her own behalf. 6. Skin/Wound Care: Routine skin checks 7. Fluids/Electrolytes/Nutrition: encourage PO intake  -hypokalemia: resolved   -kdur to 225m bid   -recheck 7/1 was 4.4 8.  Atrial fibrillation.  Amiodarone 100 mg daily.  Cardiac rate controlled.  Eliquis on hold due to  hemorrhage 9.  Hypertension.  Coreg 12.5 mg twice daily, increased coreg to 18.752mid   Vitals:   08/20/17 2047 08/21/17 0401  BP: (!) 110/52 (!) 175/64  Pulse: (!) 55 (!) 57  Resp: 15 18  Temp: 98 F (36.7 C) 98 F (36.7 C)  SpO2: 98% 98%  Controlled 7/10 10.  Chronic diastolic  congestive heart failure.  Monitor for signs of fluid overload Filed Weights   08/19/17 0500 08/20/17 0252 08/21/17 0401  Weight: 68 kg (149 lb 14.6 oz) 68.1 kg (150 lb 2.1 oz) 68.2 kg (150 lb 5.7 oz)   Stable on 7/10 11.  Hypothyroidism.  Synthroid 12.  Renal insufficiency.  Baseline creatinine 1.39.  Creatinine 1.04 on 6/28, 1.21 on 7/1, recheck in am  Continue to monitor, I [id number].  Hyperlipidemia.  Lipitor 14. Acute blood loss anemia  Hemoglobin 11.2 on 6/27, 11.9 upon admit to acute, stable at 11.4  Continue to monitor 15.  FLexor wtihdrawl spasms, cont  tizanidine 2mg 8m , will see if Botox samples available from clinic, would benefit from injection of Hamstrings, pt agrees to this via Y/N responses LOS (Days) [place]  AndreCharlett Blake7/12/2017 7:58 AM

## 2017-08-22 ENCOUNTER — Inpatient Hospital Stay (HOSPITAL_COMMUNITY): Payer: Medicare Other | Admitting: Speech Pathology

## 2017-08-22 ENCOUNTER — Inpatient Hospital Stay (HOSPITAL_COMMUNITY): Payer: Medicare Other | Admitting: Physical Therapy

## 2017-08-22 ENCOUNTER — Inpatient Hospital Stay (HOSPITAL_COMMUNITY): Payer: Medicare Other | Admitting: Occupational Therapy

## 2017-08-22 NOTE — Progress Notes (Signed)
[facility] MEDICINE & REHABILITATION     PROGRESS NOTE    Subjective/Complaints:  Appreciate dietary note, taking ~50% caloric needs  ROS: limited due to cognition/aphasia  Objective:  No results found. No results for input(s): WBC, HGB, HCT, PLT in the last 72 hours. No results for input(s): NA, K, CL, GLUCOSE, BUN, CREATININE, CALCIUM in the last 72 hours.  Invalid input(s): CO CBG (last 3)  No results for input(s): GLUCAP in the last 72 hours.  Wt Readings from Last 3 Encounters:  08/22/17 67.5 kg (148 lb 13 oz)  08/02/17 70.1 kg (154 lb 8.7 oz)  05/05/17 70.3 kg (155 lb)     Intake/Output Summary (Last 24 hours) at 08/22/2017 0810 Last data filed at 08/21/2017 1800 Gross per 24 hour  Intake 360 ml  Output -  Net 360 ml    Vital Signs: Blood pressure (!) 184/74, pulse 65, temperature 97.9 F (36.6 C), temperature source Oral, resp. rate 18, weight 67.5 kg (148 lb 13 oz), SpO2 99 %. Physical Exam:  Constitutional: No distress . Vital signs reviewed. HENT: Normocephalic.  Atraumatic. Eyes: EOMI. No discharge. Cardiovascular: RRR. No JVD. Respiratory: CTA Bilaterally. Normal effort. GI: BS +. Non-distended. Musc: No edema or tenderness in extremities. Neurological: She is alert  Global aphasia Right central 7 Dysarthria Motor RUE 3-/5 proximal to distal RLE: 3+ to 4-/5 HF, 4/5 distally LUE and LLE 4 to 4+/5. Apraxic Skin: Skin is warm and dry.  Psychiatric: pleasant and cooperative  Assessment/Plan: 1. Functional deficits secondary to left thalamic/internal capsule hemorrhage which require 3+ hours per day of interdisciplinary therapy in a comprehensive inpatient rehab setting. Physiatrist is providing close team supervision and 24 hour management of active medical problems listed below. Physiatrist and rehab team continue to assess barriers to discharge/monitor patient progress toward functional and medical goals.  Function:  Bathing Bathing  position   Position: Wheelchair/chair at sink  Bathing parts Body parts bathed by patient: Right arm, Chest, Abdomen, Right upper leg, Left upper leg, Left arm, Front perineal area Body parts bathed by helper: Buttocks, Right lower leg, Left lower leg, Back  Bathing assist Assist Level: Touching or steadying assistance(Pt > 75%)(Mod assist)      Upper Body Dressing/Undressing Upper body dressing   What is the patient wearing?: Pull over shirt/dress     Pull over shirt/dress - Perfomed by patient: Thread/unthread left sleeve, Put head through opening, Pull shirt over trunk, Thread/unthread right sleeve Pull over shirt/dress - Perfomed by helper: Thread/unthread right sleeve        Upper body assist Assist Level: Supervision or verbal cues      Lower Body Dressing/Undressing Lower body dressing   What is the patient wearing?: Pants, Non-skid slipper socks     Pants- Performed by patient: Thread/unthread right pants leg, Thread/unthread left pants leg Pants- Performed by helper: Pull pants up/down   Non-skid slipper socks- Performed by helper: Don/doff right sock, Don/doff left sock                  Lower body assist Assist for lower body dressing: (Max assist)      Toileting Toileting Toileting activity did not occur: No continent bowel/bladder event   Toileting steps completed by helper: Adjust clothing prior to toileting, Performs perineal hygiene, Adjust clothing after toileting Toileting Assistive Devices: Other (comment)(stedy)  Toileting assist Assist level: Touching or steadying assistance (Pt.75%)   Transfers Chair/bed transfer   Chair/bed transfer method: Other Chair/bed transfer assist level:  Moderate assist (Pt 50 - 74%/lift or lower) Chair/bed transfer assistive device: Mechanical lift Mechanical lift: Stedy   Locomotion Ambulation Ambulation activity did not occur: (P) Safety/medical concerns   Max distance: 15 ft Assist level: Moderate assist (Pt  50 - 74%)   Wheelchair   Type: Manual Max wheelchair distance: 50 ft Assist Level: Moderate assistance (Pt 50 - 74%)  Cognition Comprehension Comprehension assist level: Understands basic 50 - 74% of the time/ requires cueing 25 - 49% of the time, Understands basic 75 - 89% of the time/ requires cueing 10 - 24% of the time  Expression Expression assist level: Expresses basic 25 - 49% of the time/requires cueing 50 - 75% of the time. Uses single words/gestures., Expresses basic 50 - 74% of the time/requires cueing 25 - 49% of the time. Needs to repeat parts of sentences.  Social Interaction Social Interaction assist level: Interacts appropriately 75 - 89% of the time - Needs redirection for appropriate language or to initiate interaction.  Problem Solving Problem solving assist level: Solves basic 50 - 74% of the time/requires cueing 25 - 49% of the time  Memory Memory assist level: Recognizes or recalls 25 - 49% of the time/requires cueing 50 - 75% of the time   Medical Problem List and Plan: 1.  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  DVT Prophylaxis/Anticoagulation: SCDs. 3. Pain Management: Tylenol as needed 4. Mood: Provide emotional support 5. Neuropsych: This patient is not capable of making decisions on her own behalf. 6. Skin/Wound Care: Routine skin checks 7. Fluids/Electrolytes/Nutrition: encourage PO intake  -hypokalemia: resolved   -kdur to 12mq bid   -recheck 7/1 was 4.4 8.  Atrial fibrillation.  Amiodarone 100 mg daily.  Cardiac rate controlled.  Eliquis on hold due to  hemorrhage 9.  Hypertension.  Coreg 12.5 mg twice daily, increased coreg to 18.737mbid   Vitals:   08/21/17 2024 08/22/17 0625  BP: 115/69 (!) 184/74  Pulse: (!)  57 65  Resp: 17 18  Temp: 98.3 F (36.8 C) 97.9 F (36.6 C)  SpO2: 98% 9928%elevated systolic 7/4/13has been under good control yesterday 10.  Chronic diastolic congestive heart failure.  Monitor for signs of fluid overload Filed Weights   08/20/17 0252 08/21/17 0401 08/22/17 0500  Weight: 68.1 kg (150 lb 2.1 oz) 68.2 kg (150 lb 5.7 oz) 67.5 kg (148 lb 13 oz)   Slightly lower this am monitor 11.  Hypothyroidism.  Synthroid 12.  Renal insufficiency.  Baseline creatinine 1.39.  Creatinine 1.04 on 6/28, 1.21 on 7/1,  Continue to monitor, I [id number].  Hyperlipidemia.  Lipitor 14. Acute blood loss anemia  Hemoglobin 11.2 on 6/27, 11.9 upon admit to acute, stable at 11.4  Continue to monitor 15.  FLexor wtihdrawl spasms, cont  tizanidine 2mg40mD , would benefit from botox injection of Hamstrings, pt aphasic , would like family input, did not answer call yesterday will try again today LOS (Days) 16 A FACE TO FACE EVALUATION WAS PERFORMED  AndrCharlett Blake 08/22/2017 8:10 AM

## 2017-08-22 NOTE — Progress Notes (Signed)
Speech Language Pathology Daily Session Note  Patient Details  Name: Sara Russell MRN: [id number] Date of Birth: [birth year]  Today's Date: 08/22/2017 SLP Individual Time: 3567-0141 SLP Individual Time Calculation (min): 45 min  Short Term Goals: Week 3: SLP Short Term Goal 1 (Week 3): Pt will demonstrate selective attention in mildly distracting environment for ~ 30 minutes with Mod A cues.  SLP Short Term Goal 2 (Week 3): Pt will complete basic familiar tasks with supervision cues.  SLP Short Term Goal 3 (Week 3): Pt will scan to right of midline to locate objects in 8 of 10 opportunities with Supervision cues.  SLP Short Term Goal 4 (Week 3): Pt will verbalize at the phrase level during nonstructured tasks with mod assist multimodal cues.    Skilled Therapeutic Interventions: Skilled treatment session focused on cognition goals. SLP facilitated session by providing Mod A faded to Min A to successfully sort money. Pt required visual aid and Min A cues to scan to right of midline to locate coins. Continue per current plan of care.      Function:  Eating Eating   Modified Consistency Diet: No Eating Assist Level: Set up assist for;More than reasonable amount of time   Eating Set Up Assist For: Opening containers;Cutting food       Cognition Comprehension Comprehension assist level: Understands basic 25 - 49% of the time/ requires cueing 50 - 75% of the time  Expression   Expression assist level: Expresses basis less than 25% of the time/requires cueing >75% of the time.;Expresses basic 25 - 49% of the time/requires cueing 50 - 75% of the time. Uses single words/gestures.  Social Interaction Social Interaction assist level: Interacts appropriately 25 - 49% of time - Needs frequent redirection.  Problem Solving Problem solving assist level: Solves basic 50 - 74% of the time/requires cueing 25 - 49% of the time  Memory Memory assist level: Recognizes or recalls 25 - 49% of the  time/requires cueing 50 - 75% of the time    Pain Pain Assessment Pain Scale: 0-10 Pain Score: 0-No pain  Therapy/Group: Individual Therapy  Ashantee Deupree 08/22/2017, 11:35 AM

## 2017-08-22 NOTE — NC FL2 (Signed)
[place] LEVEL OF CARE SCREENING TOOL     IDENTIFICATION  Patient Name: Sara Russell Birthdate: [birth year] Sex: female Admission Date (Current Location): 08/06/2017  [facility] and [place] Number:  Herbalist and Address:  [facility] [place], [place] [address], [place], [place] [address]      Provider Number: [id number]  Attending Physician Name and Address:  [name], MD  Relative Name and Phone Number:  Jim-son [phone] POA    Current Level of Care: Other (Comment)(Rehab) Recommended Level of Care: [place] Prior Approval Number:    Date Approved/Denied:   PASRR Number: [id number]  Discharge Plan: SNF    Current Diagnoses: Patient Active Problem List   Diagnosis Date Noted  . Acute blood loss anemia   . Renal insufficiency   . Essential hypertension   . Labile blood pressure   . Hypokalemia   . Thalamic hemorrhage ([place]) 08/06/2017  . Hemorrhagic stroke ([place])   . Hyperlipidemia   . PAF (paroxysmal atrial fibrillation) ([place])   . Benign essential HTN   . Stage 3 chronic kidney disease ([place])   . ICH (intracerebral hemorrhage) ([place]) 08/03/2017  . At high risk for falls 02/12/2017  . Insomnia 02/12/2017  . Pulmonary nodules/lesions, multiple 01/29/2017  . Acute pain of right shoulder 01/28/2017  . Hypothyroidism 04/04/2015  . Compression fracture of L1 lumbar vertebra (HCC) 02/23/2015  . Cholelithiases 02/23/2015  . Encounter for palliative care   . Chronic diastolic congestive heart failure ([place])   . Osteoporosis 05/13/2014  . Gout 08/16/2013  . Hyponatremia 08/10/2013  . Left atrial enlargement 01/10/2013  . A-fib ([place]) 12/08/2012  . Rosacea 05/15/2012  . Anemia due to chronic blood loss 08/28/2010  . DEGENERATIVE DISC DISEASE, LUMBAR SPINE 07/14/2009  . GERD 07/11/2009  . PERSONAL HX COLONIC POLYPS 07/11/2009  . HYPERCHOLESTEROLEMIA 04/10/2006  . HYPERTENSION, BENIGN SYSTEMIC  04/10/2006  . History of pulmonary embolism 04/10/2006  . DIVERTICULOSIS OF COLON 04/10/2006  . ACTINIC KERATOSIS 04/10/2006  . GEN OSTEOARTHROSIS INVOLVING MULTIPLE SITES 04/10/2006    Orientation RESPIRATION BLADDER Height & Weight     Self, Place, Situation  Normal External catheter(I & O cath every 6-8 hours) Weight: 148 lb 13 oz (67.5 kg) Height:     BEHAVIORAL SYMPTOMS/MOOD NEUROLOGICAL BOWEL NUTRITION STATUS      Incontinent Diet(regular diet thin liquids)  AMBULATORY STATUS COMMUNICATION OF NEEDS Skin   Extensive Assist Verbally Normal                       Personal Care Assistance Level of Assistance  Bathing, Feeding, Dressing Bathing Assistance: Limited assistance Feeding assistance: Limited assistance Dressing Assistance: Limited assistance     Functional Limitations Info  Sight, Speech Sight Info: Impaired   Speech Info: Impaired    SPECIAL CARE FACTORS FREQUENCY  PT (By licensed PT), OT (By licensed OT), Bowel and bladder program, Speech therapy     PT Frequency: 5x week OT Frequency: 5x week Bowel and Bladder Program Frequency: Timed toileting and bladder medications   Speech Therapy Frequency: 5x week      Contractures Contractures Info: Not present    Additional Factors Info  Code Status, Allergies Code Status Info: DNR Allergies Info: Lohexol, Ivp dye, scallops, sheelfish, sulfa antiobiotics           Current Medications (08/22/2017):  This is the current hospital active medication list Current Facility-Administered Medications  Medication Dose Route Frequency Provider  Last Rate Last Dose  . acetaminophen (TYLENOL) tablet 650 mg  650 mg Oral Q4H PRN Cathlyn Parsons, PA-C   650 mg at 08/21/17 0403   Or  . acetaminophen (TYLENOL) solution 650 mg  650 mg Per Tube Q4H PRN Cathlyn Parsons, PA-C   650 mg at 08/20/17 4462   Or  . acetaminophen (TYLENOL) suppository 650 mg  650 mg Rectal Q4H PRN Angiulli, Lavon Paganini, PA-C      .  amiodarone (PACERONE) tablet 100 mg  100 mg Oral Daily Cathlyn Parsons, PA-C   100 mg at 08/22/17 0929  . atorvastatin (LIPITOR) tablet 10 mg  10 mg Oral Daily Cathlyn Parsons, PA-C   10 mg at 08/21/17 1742  . bethanechol (URECHOLINE) tablet 10 mg  10 mg Oral TID [name], MD   10 mg at 08/22/17 0929  . carvedilol (COREG) tablet 18.75 mg  18.75 mg Oral BID WC Meredith Staggers, MD   18.75 mg at 08/22/17 0928  . cephALEXin (KEFLEX) capsule 250 mg  250 mg Oral Q8H Marletta Lor, MD   250 mg at 08/22/17 8638  . feeding supplement (ENSURE ENLIVE) (ENSURE ENLIVE) liquid 237 mL  237 mL Oral BID BM Kirsteins, Luanna Salk, MD   237 mL at 08/22/17 0934  . feeding supplement (PRO-STAT SUGAR FREE 64) liquid 30 mL  30 mL Oral Q1500 Kirsteins, Luanna Salk, MD   30 mL at 08/21/17 1450  . hydroxypropyl methylcellulose / hypromellose (ISOPTO TEARS / GONIOVISC) 2.5 % ophthalmic solution 1 drop  1 drop Both Eyes PRN Marletta Lor, MD   1 drop at 08/16/17 2115  . levothyroxine (SYNTHROID, LEVOTHROID) tablet 75 mcg  75 mcg Oral QAC breakfast Cathlyn Parsons, PA-C   75 mcg at 08/22/17 1771  . potassium chloride 20 MEQ/15ML (10%) solution 20 mEq  20 mEq Oral BID Meredith Staggers, MD   20 mEq at 08/22/17 0930  . senna-docusate (Senokot-S) tablet 1 tablet  1 tablet Oral BID Cathlyn Parsons, PA-C   1 tablet at 08/22/17 1657  . simethicone (MYLICON) chewable tablet 80 mg  80 mg Oral Q6H PRN Cathlyn Parsons, PA-C   80 mg at 08/17/17 2052  . tiZANidine (ZANAFLEX) tablet 2 mg  2 mg Oral TID WC & HS Kirsteins, Luanna Salk, MD   2 mg at 08/22/17 0929  . traZODone (DESYREL) tablet 25-50 mg  25-50 mg Oral QHS PRN Cathlyn Parsons, PA-C   50 mg at 08/07/17 2135     Discharge Medications: Please see discharge summary for a list of discharge medications.  Relevant Imaging Results:  Relevant Lab Results:   Additional Information SSN: [id number]  Lyzbeth Genrich, Gardiner Rhyme, LCSW

## 2017-08-22 NOTE — Progress Notes (Signed)
Occupational Therapy Session Note  Patient Details  Name: Sara Russell MRN: [id number] Date of Birth: 02/08/1931  Today's Date: 08/22/2017 OT Individual Time: 2863-8177 and 1405-1430 OT Individual Time Calculation (min): 60 min and 25 min   Short Term Goals: Week 3:  OT Short Term Goal 1 (Week 3): STG = LTGs due to remaining LOS  Skilled Therapeutic Interventions/Progress Updates:    1) Treatment session with focus on ADL retraining and functional transfers.  Pt received upright in bed finishing breakfast.  Pt demonstrating increased visual scanning and attention to Rt side of plate to finish meal and locate various items on breakfast tray.  Completed bed mobility with supervision to EOB and mod assist (light lifting) sit > stand to complete stand pivot transfer to w/c.  When asked about need to toilet, pt stated "yes".  Completed stand pivot transfer to toilet with mod assist and mod tactile cues for sequencing of stepping pattern - pt also noted to be incontinent of urine prior to toileting.  Completed perineal hygiene with setup and min assist for standing balance.  Pt completed dressing tasks with setup and min cues to attempt LB dressing to include donning shoes.  Pt attempted to fasten shoes but unable to complete due to RUE weakness - pt may benefit from elastic shoe laces.  Engaged in grooming tasks seated at sink with setup for items.  Pt left upright in w/c with seat belt alarm on and all needs in reach.  2) Treatment session with focus on visual scanning, sequencing, and use of RUE.  Engaged in visual scanning with letter magnets to locate letters to spell pt's first name.  Pt would line up all letters but did not attempt to locate any specific letters to spell her name despite multiple cues.  Utilized pattern matching to promote success with matching magnet letters to written name with improved success, still requiring mod cues for sequencing of letters.  Pt utilized RUE 50% of time,  requiring cues to incorporate RUE.  Pt returned to room and left upright in w/c with seat belt alarm on and all needs in reach.  Therapy Documentation Precautions:  Precautions Precautions: Fall Precaution Comments: watch BP / visual deficits; hx of spinal compression fxs (osteoporosis); L ankle fixed 2/2 ORIF 15 years ago Restrictions Weight Bearing Restrictions: No Pain: Pain Assessment Pain Scale: 0-10 Pain Score: 0-No pain  See Function Navigator for Current Functional Status.   Therapy/Group: Individual Therapy  Simonne Come 08/22/2017, 12:27 PM

## 2017-08-22 NOTE — Progress Notes (Signed)
Social Work Patient ID: Sara Russell, female   DOB: [birth year], 84 y.o.   MRN: [id number]  Have left a message for Donna-Admissions Coordinator at [facility] regarding pt being ready to transfer to Rehab-SNF on Wed 7/17. Son on board and aware of the plan. Work toward transfer on Wed.

## 2017-08-22 NOTE — Progress Notes (Addendum)
Physical Therapy Session Note  Patient Details  Name: Sara Russell MRN: [id number] Date of Birth: April 04, 1930  Today's Date: 08/22/2017 PT Individual Time: 1100-1157 PT Individual Time Calculation (min): 57 min   Short Term Goals: Week 3:  PT Short Term Goal 1 (Week 3): STG = LTG due to estimated d/c date.  Skilled Therapeutic Interventions/Progress Updates:  Pt received in w/c & agreeable to tx. Pt with behaviors and reports pain in R wrist during gait - rest breaks provided PRN & pt educated on need to prevent R lateral lean as pt leaning heavily on R hand orthosis, thus causing pain. Pt ambulates 10 ft + 10 ft with RW, R hand orthosis and min assist with therapist providing multimodal cuing for gait pattern & R anterior hip rotation to advance RLE. Provided pt with R platform RW to alleviate wrist pain and pt ambulates 10 ft with AD with ongoing assistance and max assist for sequencing and managing AD when turning to sit. Throughout session, focused on hand placement for sit<>stand transfers to increase safety. Pt completes stand pivot mat table>w/c on L with min assist with assistance weight shifting to advance LE. Pt requires significantly extra time to perform all functional tasks. Pt declined propelling w/c back towards room 2/2 fatigue. At end of session pt left sitting in w/c with belt alarm donned & all needs within reach. Pt unable to identify which button to push to turn on TV and therapist provided total assist.  While therapist obtained platform for RW pt educated to perform BLE long arc quads with 3# ankle weights but pt unable and instead unlacing shoes.   Therapy Documentation Precautions:  Precautions Precautions: Fall Precaution Comments: watch BP / visual deficits; hx of spinal compression fxs (osteoporosis); L ankle fixed 2/2 ORIF 15 years ago Restrictions Weight Bearing Restrictions: No   See Function Navigator for Current Functional Status.   Therapy/Group:  Individual Therapy  Waunita Schooner 08/22/2017, 12:15 PM

## 2017-08-22 NOTE — Plan of Care (Signed)
  Problem: Consults Goal: RH STROKE PATIENT EDUCATION Description See Patient Education module for education specifics  Outcome: Progressing   Problem: RH BOWEL ELIMINATION Goal: RH STG MANAGE BOWEL WITH ASSISTANCE Description STG Manage Bowel with Mod. Assistance.  Outcome: Progressing   Problem: RH BLADDER ELIMINATION Goal: RH STG MANAGE BLADDER WITH ASSISTANCE Description STG Manage Bladder With Mod. Assistance  Outcome: Progressing   Problem: RH SKIN INTEGRITY Goal: RH STG MAINTAIN SKIN INTEGRITY WITH ASSISTANCE Description STG Maintain Skin Integrity With Mod Assistance.  Outcome: Progressing   Problem: RH SAFETY Goal: RH STG ADHERE TO SAFETY PRECAUTIONS W/ASSISTANCE/DEVICE Description STG Adhere to Safety Precautions With Mod Assistance/Device.  Outcome: Progressing Goal: RH STG DECREASED RISK OF FALL WITH ASSISTANCE Description STG Decreased Risk of Fall With Mod Assistance.  Outcome: Progressing   Problem: RH COGNITION-NURSING Goal: RH STG USES MEMORY AIDS/STRATEGIES W/ASSIST TO PROBLEM SOLVE Description STG Uses Memory Aids/Strategies With Mod Assistance to Problem Solve.  Outcome: Progressing   Problem: RH PAIN MANAGEMENT Goal: RH STG PAIN MANAGED AT OR BELOW PT'S PAIN GOAL Description Less than 3,on 1 to 10 scale  Outcome: Progressing

## 2017-08-22 NOTE — Progress Notes (Signed)
Speech Language Pathology Daily Make-Up Session Note  Patient Details  Name: Sara Russell MRN: [id number] Date of Birth: 12/26/1930  Today's Date: 08/22/2017 SLP Individual Time: 1000-1030 SLP Individual Time Calculation (min): 30 min  Short Term Goals: Week 3: SLP Short Term Goal 1 (Week 3): Pt will demonstrate selective attention in mildly distracting environment for ~ 30 minutes with Mod A cues.  SLP Short Term Goal 2 (Week 3): Pt will complete basic familiar tasks with supervision cues.  SLP Short Term Goal 3 (Week 3): Pt will scan to right of midline to locate objects in 8 of 10 opportunities with Supervision cues.  SLP Short Term Goal 4 (Week 3): Pt will verbalize at the phrase level during nonstructured tasks with mod assist multimodal cues.    Skilled Therapeutic Interventions: Skilled treatment session focused on speech goals. Patient named pictures of functional items with 75% accuracy and Min A verbal cues but required Max A multimodal cues to compare/contrast 2 items at the phrase level. Patient demonstrated intermittent awareness of verbal errors and required overall Max A to self-correct. Patient left upright in wheelchair with alarm on and all needs within reach. Continue with current plan of care.      Function:  Eating Eating   Modified Consistency Diet: No Eating Assist Level: Set up assist for;More than reasonable amount of time   Eating Set Up Assist For: Opening containers;Cutting food       Cognition Comprehension Comprehension assist level: Understands basic 25 - 49% of the time/ requires cueing 50 - 75% of the time  Expression   Expression assist level: Expresses basic 25 - 49% of the time/requires cueing 50 - 75% of the time. Uses single words/gestures.  Social Interaction Social Interaction assist level: Interacts appropriately 25 - 49% of time - Needs frequent redirection.  Problem Solving Problem solving assist level: Solves basic 50 - 74% of the  time/requires cueing 25 - 49% of the time  Memory Memory assist level: Recognizes or recalls 25 - 49% of the time/requires cueing 50 - 75% of the time    Pain No/Denies Pain   Therapy/Group: Individual Therapy  [name] 08/22/2017, 1:40 PM

## 2017-08-23 ENCOUNTER — Inpatient Hospital Stay (HOSPITAL_COMMUNITY): Payer: Medicare Other | Admitting: Occupational Therapy

## 2017-08-23 NOTE — Progress Notes (Signed)
Occupational Therapy Session Note  Patient Details  Name: Sara Russell MRN: [id number] Date of Birth: 1930-08-18  Today's Date: 08/24/2017 OT Group Time: 1300-1400 OT Group Time Calculation (min): 60 min   Skilled Therapeutic Interventions/Progress Updates:    Pt participated in therapeutic w/c level dance group with focus on UE/LE strengthening, activity tolerance, and social participation for carryover during self care tasks. Pt was guided through various dance-based exercises involving UB/LB and trunk. Emphasis placed on Rt NMR and Rt attention. She was very engaged: scanning Rt>Lt to watch others dancing while smiling, kicking legs with instruction, and bilaterally integrating UEs to dance with therapist and other members. At end of session she was escorted back to room in w/c and left with all needs and safety belt fastened.   Therapy Documentation Precautions:  Precautions Precautions: Fall Precaution Comments: watch BP / visual deficits; hx of spinal compression fxs (osteoporosis); L ankle fixed 2/2 ORIF 15 years ago Restrictions Weight Bearing Restrictions: No ADL: ADL Eating: Not assessed Grooming: Not assessed Toileting: Not assessed Toilet Transfer: Not assessed Toilet Transfer Method: Not assessed Tub/Shower Transfer: Not assessed Walk-In Shower Transfer: Not assessed ADL Comments: see functional nav. ADLs on eval limited d/t pts ability and level of function     See Function Navigator for Current Functional Status.   Therapy/Group: Group Therapy  Debbe Crumble A Milea Klink 08/24/2017, 4:20 PM

## 2017-08-23 NOTE — Progress Notes (Signed)
[place] PHYSICAL MEDICINE & REHABILITATION     PROGRESS NOTE    Subjective/Complaints:  Pt appears comfortable. No problems per RN  ROS: limited due to language/communication   Objective:  No results found. No results for input(s): WBC, HGB, HCT, PLT in the last 72 hours. No results for input(s): NA, K, CL, GLUCOSE, BUN, CREATININE, CALCIUM in the last 72 hours.  Invalid input(s): CO CBG (last 3)  No results for input(s): GLUCAP in the last 72 hours.  Wt Readings from Last 3 Encounters:  08/23/17 69 kg (152 lb 1.9 oz)  08/02/17 70.1 kg (154 lb 8.7 oz)  05/05/17 70.3 kg (155 lb)     Intake/Output Summary (Last 24 hours) at 08/23/2017 0854 Last data filed at 08/23/2017 0340 Gross per 24 hour  Intake 480 ml  Output 600 ml  Net -120 ml    Vital Signs: Blood pressure 130/66, pulse (!) 59, temperature 98.1 F (36.7 C), temperature source Axillary, resp. rate 20, weight 69 kg (152 lb 1.9 oz), SpO2 99 %. Physical Exam:  Constitutional: No distress . Vital signs reviewed. HEENT: EOMI, oral membranes moist Neck: supple Cardiovascular: RRR without murmur. No JVD    Respiratory: CTA Bilaterally without wheezes or rales. Normal effort    GI: BS +, non-tender, non-distended  Musc: No edema or tenderness in extremities. Neurological: She is alert  Global aphasia persists. Occasional automatic responses and yes/no Right central 7 Dysarthria Motor RUE 3-/5 proximal to distal RLE: 3+ to 4-/5 HF, 4/5 distally LUE and LLE 4 to 4+/5. Apraxic Skin: Skin is warm and dry.  Psychiatric: pleasant and cooperative  Assessment/Plan: 1. Functional deficits secondary to left thalamic/internal capsule hemorrhage which require 3+ hours per day of interdisciplinary therapy in a comprehensive inpatient rehab setting. Physiatrist is providing close team supervision and 24 hour management of active medical problems listed below. Physiatrist and rehab team continue to assess barriers to  discharge/monitor patient progress toward functional and medical goals.  Function:  Bathing Bathing position   Position: Wheelchair/chair at sink  Bathing parts Body parts bathed by patient: Right arm, Chest, Abdomen, Right upper leg, Left upper leg, Left arm, Front perineal area Body parts bathed by helper: Buttocks, Right lower leg, Left lower leg, Back  Bathing assist Assist Level: Touching or steadying assistance(Pt > 75%)(Mod assist)      Upper Body Dressing/Undressing Upper body dressing   What is the patient wearing?: Pull over shirt/dress     Pull over shirt/dress - Perfomed by patient: Thread/unthread left sleeve, Put head through opening, Pull shirt over trunk, Thread/unthread right sleeve Pull over shirt/dress - Perfomed by helper: Thread/unthread right sleeve        Upper body assist Assist Level: Supervision or verbal cues      Lower Body Dressing/Undressing Lower body dressing   What is the patient wearing?: Pants, Shoes, [employer]- Performed by patient: Thread/unthread right pants leg, Thread/unthread left pants leg Pants- Performed by helper: Pull pants up/down   Non-skid slipper socks- Performed by helper: Don/doff right sock, Don/doff left sock     Shoes - Performed by patient: Don/doff right shoe, Don/doff left shoe Shoes - Performed by helper: Fasten right, Fasten left       TED Hose - Performed by helper: Don/doff right TED hose, Don/doff left TED hose  Lower body assist Assist for lower body dressing: (Mod assist)      Toileting Toileting Toileting activity did not occur: No continent bowel/bladder event  Toileting steps completed by patient: Adjust clothing prior to toileting, Performs perineal hygiene Toileting steps completed by helper: Adjust clothing after toileting Toileting Assistive Devices: Other (comment)(stedy)  Toileting assist Assist level: Touching or steadying assistance (Pt.75%)   Transfers Chair/bed transfer    Chair/bed transfer method: Stand pivot Chair/bed transfer assist level: Touching or steadying assistance (Pt > 75%) Chair/bed transfer assistive device: Armrests Mechanical lift: Stedy   Locomotion Ambulation Ambulation activity did not occur: (P) Safety/medical concerns   Max distance: 10 ft Assist level: Touching or steadying assistance (Pt > 75%)   Wheelchair   Type: Manual Max wheelchair distance: 50 ft Assist Level: Moderate assistance (Pt 50 - 74%)  Cognition Comprehension Comprehension assist level: Understands basic 25 - 49% of the time/ requires cueing 50 - 75% of the time  Expression Expression assist level: Expresses basic 25 - 49% of the time/requires cueing 50 - 75% of the time. Uses single words/gestures.  Social Interaction Social Interaction assist level: Interacts appropriately 25 - 49% of time - Needs frequent redirection.  Problem Solving Problem solving assist level: Solves basic 50 - 74% of the time/requires cueing 25 - 49% of the time  Memory Memory assist level: Recognizes or recalls 25 - 49% of the time/requires cueing 50 - 75% of the time   Medical Problem List and Plan: 1.  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  DVT Prophylaxis/Anticoagulation: SCDs. 3. Pain Management: Tylenol as needed 4. Mood: Provide emotional support 5. Neuropsych: This patient is not capable of making decisions on her own behalf. 6. Skin/Wound Care: Routine skin checks 7. Fluids/Electrolytes/Nutrition: encourage PO intake  -hypokalemia: resolved   -kdur to 65mq bid   -recheck 7/1 was 4.4   -intake inconsistent to poor    -check prealbumin monday 8.  Atrial fibrillation.  Amiodarone 100 mg daily.  Cardiac rate controlled.  Eliquis on  hold due to  hemorrhage 9.  Hypertension.  Coreg 12.5 mg twice daily, increased coreg to 18.744mbid   Vitals:   08/22/17 1946 08/23/17 0336  BP: (!) 105/51 130/66  Pulse: (!) 54 (!) 59  Resp: 16 20  Temp: 98.1 F (36.7 C) 98.1 F (36.7 C)  SpO2: 98% 99%  good control 7/13 10.  Chronic diastolic congestive heart failure.    Filed Weights   08/21/17 0401 08/22/17 0500 08/23/17 0433  Weight: 68.2 kg (150 lb 5.7 oz) 67.5 kg (148 lb 13 oz) 69 kg (152 lb 1.9 oz)   Weights stable 11.  Hypothyroidism.  Synthroid 12.  Renal insufficiency.  Baseline creatinine 1.39.  Creatinine 1.04 on 6/28, 1.21 on 7/1,  Continue to monitor, I 68049m13.  Hyperlipidemia.  Lipitor 14. Acute blood loss anemia  Hemoglobin 11.2 on 6/27, 11.9 upon admit to acute, stable at 11.4  Continue to monitor 15.  FLexor wtihdrawl spasms: cont  tizanidine 2mg81mD   -?botox hamstrings  LOS (Days) 17 ASalinasLUATION WAS PERFORMED  ZachMeredith Staggers 08/23/2017 8:54 AM

## 2017-08-24 ENCOUNTER — Inpatient Hospital Stay (HOSPITAL_COMMUNITY): Payer: Medicare Other | Admitting: Occupational Therapy

## 2017-08-24 NOTE — Progress Notes (Signed)
Occupational Therapy Session Note  Patient Details  Name: Sara Russell MRN: [id number] Date of Birth: 1930/10/11  Today's Date: 08/24/2017 OT Individual Time: 0263-7858 OT Individual Time Calculation (min): 56 min   Short Term Goals: Week 3:  OT Short Term Goal 1 (Week 3): STG = LTGs due to remaining LOS  Skilled Therapeutic Interventions/Progress Updates:    Pt greeted supine in bed with no c/o pain. Agreeable to shower. Stand pivot<w/c<TTB completed with Mod A for supporting Rt knee. She bathed at sit<stand level ("verbalized: "Wow this is cold!" before water warmed up). Able to integrate R UE to lather hair with shampoo and wash Lt side. She initiated using Rt to complete perihygiene when standing with grab bars. Afterwards dressing completed sit<stand w/c at sink. She selected her clothes via reaching for desired outfit. Pt able to thread both LEs into pants and assist with elevating LB garments over hips. Due to Rt pushing tendencies in standing, OT lifted pants fully over hips. Hairbrushing completed seated in w/c with pt initiating using Rt at dominant level, even using Lt to actively assist her affected limb when fatigued. At end of tx pt was left with safety belt and son [name].   Therapy Documentation Precautions:  Precautions Precautions: Fall Precaution Comments: watch BP / visual deficits; hx of spinal compression fxs (osteoporosis); L ankle fixed 2/2 ORIF 15 years ago Restrictions Weight Bearing Restrictions: No Pain: Pain Assessment Pain Scale: Faces Faces Pain Scale: Hurts even more Pain Type: Acute pain Pain Location: Leg Pain Orientation: Right Pain Descriptors / Indicators: Spasm Pain Frequency: Intermittent Pain Onset: On-going Pain Intervention(s): Medication (See eMAR) ADL: ADL Eating: Not assessed Grooming: Not assessed Toileting: Not assessed Toilet Transfer: Not assessed Toilet Transfer Method: Not assessed Tub/Shower Transfer: Not assessed Walk-In  Shower Transfer: Not assessed ADL Comments: see functional nav. ADLs on eval limited d/t pts ability and level of function     See Function Navigator for Current Functional Status.   Therapy/Group: Individual Therapy  Cam Dauphin A Ellen Goris 08/24/2017, 10:02 AM

## 2017-08-24 NOTE — Progress Notes (Signed)
[place] PHYSICAL MEDICINE & REHABILITATION     PROGRESS NOTE    Subjective/Complaints:  Patient comfortable, no new complaints this morning.  ROS: limited due to language/communication   Objective:  No results found. No results for input(s): WBC, HGB, HCT, PLT in the last 72 hours. No results for input(s): NA, K, CL, GLUCOSE, BUN, CREATININE, CALCIUM in the last 72 hours.  Invalid input(s): CO CBG (last 3)  No results for input(s): GLUCAP in the last 72 hours.  Wt Readings from Last 3 Encounters:  08/24/17 68.5 kg (151 lb 0.2 oz)  08/02/17 70.1 kg (154 lb 8.7 oz)  05/05/17 70.3 kg (155 lb)     Intake/Output Summary (Last 24 hours) at 08/24/2017 0837 Last data filed at 08/24/2017 0554 Gross per 24 hour  Intake 340 ml  Output 1475 ml  Net -1135 ml    Vital Signs: Blood pressure 131/64, pulse (!) 56, temperature 98 F (36.7 C), temperature source Oral, resp. rate 20, weight 68.5 kg (151 lb 0.2 oz), SpO2 97 %. Physical Exam:  Constitutional: No distress . Vital signs reviewed. HEENT: EOMI, oral membranes moist Neck: supple Cardiovascular: RRR without murmur. No JVD    Respiratory: CTA Bilaterally without wheezes or rales. Normal effort    GI: BS +, non-tender, non-distended  Musc: No edema or tenderness in extremities. Neurological: She is alert  Improving language.  Speaking in short sentences and phrases now. Right central 7 Dysarthria Motor RUE 3-/5 proximal to distal RLE: 3+ to 4-/5 HF, 4/5 distally LUE and LLE 4 to 4+/5. Apraxic Skin: Skin is warm and dry.  Psychiatric: pleasant and cooperative  Assessment/Plan: 1. Functional deficits secondary to left thalamic/internal capsule hemorrhage which require 3+ hours per day of interdisciplinary therapy in a comprehensive inpatient rehab setting. Physiatrist is providing close team supervision and 24 hour management of active medical problems listed below. Physiatrist and rehab team continue to assess  barriers to discharge/monitor patient progress toward functional and medical goals.  Function:  Bathing Bathing position   Position: Wheelchair/chair at sink  Bathing parts Body parts bathed by patient: Right arm, Chest, Abdomen, Right upper leg, Left upper leg, Left arm, Front perineal area Body parts bathed by helper: Buttocks, Right lower leg, Left lower leg, Back  Bathing assist Assist Level: Touching or steadying assistance(Pt > 75%)(Mod assist)      Upper Body Dressing/Undressing Upper body dressing   What is the patient wearing?: Pull over shirt/dress     Pull over shirt/dress - Perfomed by patient: Thread/unthread left sleeve, Put head through opening, Pull shirt over trunk, Thread/unthread right sleeve Pull over shirt/dress - Perfomed by helper: Thread/unthread right sleeve        Upper body assist Assist Level: Supervision or verbal cues      Lower Body Dressing/Undressing Lower body dressing   What is the patient wearing?: Pants, Shoes, [employer]- Performed by patient: Thread/unthread right pants leg, Thread/unthread left pants leg Pants- Performed by helper: Pull pants up/down   Non-skid slipper socks- Performed by helper: Don/doff right sock, Don/doff left sock     Shoes - Performed by patient: Don/doff right shoe, Don/doff left shoe Shoes - Performed by helper: Fasten right, Fasten left       TED Hose - Performed by helper: Don/doff right TED hose, Don/doff left TED hose  Lower body assist Assist for lower body dressing: (Mod assist)      Toileting Toileting Toileting activity did not occur: No continent  bowel/bladder event Toileting steps completed by patient: Adjust clothing prior to toileting, Performs perineal hygiene Toileting steps completed by helper: Adjust clothing after toileting, Performs perineal hygiene, Adjust clothing prior to toileting Toileting Assistive Devices: Other (comment)(stedy)  Toileting assist Assist level: Touching  or steadying assistance (Pt.75%)   Transfers Chair/bed transfer   Chair/bed transfer method: Stand pivot Chair/bed transfer assist level: Touching or steadying assistance (Pt > 75%) Chair/bed transfer assistive device: Armrests Mechanical lift: Stedy   Locomotion Ambulation Ambulation activity did not occur: (P) Safety/medical concerns   Max distance: 10 ft Assist level: Touching or steadying assistance (Pt > 75%)   Wheelchair   Type: Manual Max wheelchair distance: 50 ft Assist Level: Moderate assistance (Pt 50 - 74%)  Cognition Comprehension Comprehension assist level: Understands basic 25 - 49% of the time/ requires cueing 50 - 75% of the time  Expression Expression assist level: Expresses basic 25 - 49% of the time/requires cueing 50 - 75% of the time. Uses single words/gestures.  Social Interaction Social Interaction assist level: Interacts appropriately 25 - 49% of time - Needs frequent redirection.  Problem Solving Problem solving assist level: Solves basic 50 - 74% of the time/requires cueing 25 - 49% of the time  Memory Memory assist level: Recognizes or recalls 25 - 49% of the time/requires cueing 50 - 75% of the time   Medical Problem List and Plan: 1.  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  DVT Prophylaxis/Anticoagulation: SCDs. 3. Pain Management: Tylenol as needed 4. Mood: Provide emotional support 5. Neuropsych: This patient is not capable of making decisions on her own behalf. 6. Skin/Wound Care: Routine skin checks 7. Fluids/Electrolytes/Nutrition: encourage PO intake  -hypokalemia: resolved   -kdur to 82mq bid   -recheck 7/1 was 4.4   -intake remains inconsistent to poor    -check BMET and prealbumin monday 8.   Atrial fibrillation.  Amiodarone 100 mg daily.  Cardiac rate controlled.  Eliquis on hold due to  hemorrhage 9.  Hypertension.  Coreg 12.5 mg twice daily, increased coreg to 18.710mbid   Vitals:   08/23/17 1934 08/24/17 0530  BP: (!) 118/56 131/64  Pulse: 70 (!) 56  Resp: 16 20  Temp: 97.7 F (36.5 C) 98 F (36.7 C)  SpO2: 100% 97%  good control 7/14 10.  Chronic diastolic congestive heart failure.    Filed Weights   08/22/17 0500 08/23/17 0433 08/24/17 0555  Weight: 67.5 kg (148 lb 13 oz) 69 kg (152 lb 1.9 oz) 68.5 kg (151 lb 0.2 oz)   Weights stable but eating  little 11.  Hypothyroidism.  Synthroid 12.  Renal insufficiency.  Baseline creatinine 1.39.  Creatinine 1.04 on 6/28, 1.21 on 7/1,  Continue to monitor, I [id number].  Hyperlipidemia.  Lipitor 14. Acute blood loss anemia  Hemoglobin 11.2 on 6/27, 11.9 upon admit to acute, stable at 11.4  Continue to monitor 15.  FLexor wtihdrawl spasms: cont  tizanidine 2mg56mD   -?botox hamstrings  LOS (Days) 18 A FACE TO FACE EVALUATION WAS PERFORMED  ZachMeredith Staggers 08/24/2017 8:37 AM

## 2017-08-25 ENCOUNTER — Inpatient Hospital Stay (HOSPITAL_COMMUNITY): Payer: Medicare Other

## 2017-08-25 ENCOUNTER — Inpatient Hospital Stay (HOSPITAL_COMMUNITY): Payer: Medicare Other | Admitting: Physical Therapy

## 2017-08-25 ENCOUNTER — Inpatient Hospital Stay (HOSPITAL_COMMUNITY): Payer: Medicare Other | Admitting: Occupational Therapy

## 2017-08-25 LAB — BASIC METABOLIC PANEL
Anion gap: 9 (ref 5–15)
BUN: 44 mg/dL — ABNORMAL HIGH (ref 8–23)
CO2: 26 mmol/L (ref 22–32)
Calcium: 9.1 mg/dL (ref 8.9–10.3)
Chloride: 102 mmol/L (ref 98–111)
Creatinine, Ser: 1.34 mg/dL — ABNORMAL HIGH (ref 0.44–1.00)
GFR calc Af Amer: 41 mL/min — ABNORMAL LOW (ref >60)
GFR calc non Af Amer: 35 mL/min — ABNORMAL LOW (ref >60)
Glucose, Bld: 110 mg/dL — ABNORMAL HIGH (ref 70–99)
Potassium: 4.1 mmol/L (ref 3.5–5.1)
Sodium: 137 mmol/L (ref 135–145)

## 2017-08-25 LAB — PREALBUMIN: Prealbumin: 33.4 mg/dL (ref 18–38)

## 2017-08-25 NOTE — Progress Notes (Signed)
[place] PHYSICAL MEDICINE & REHABILITATION     PROGRESS NOTE    Subjective/Complaints:  "I don't think I need the botox"  , aphasia, speech apraxia but occ can make a sentence  ROS: limited due to language/communication   Objective:  No results found. No results for input(s): WBC, HGB, HCT, PLT in the last 72 hours. No results for input(s): NA, K, CL, GLUCOSE, BUN, CREATININE, CALCIUM in the last 72 hours.  Invalid input(s): CO CBG (last 3)  No results for input(s): GLUCAP in the last 72 hours.  Wt Readings from Last 3 Encounters:  08/25/17 68.5 kg (151 lb 0.2 oz)  08/02/17 70.1 kg (154 lb 8.7 oz)  05/05/17 70.3 kg (155 lb)     Intake/Output Summary (Last 24 hours) at 08/25/2017 0811 Last data filed at 08/25/2017 0038 Gross per 24 hour  Intake 420 ml  Output 1032 ml  Net -612 ml    Vital Signs: Blood pressure (!) 153/67, pulse 68, temperature 99.5 F (37.5 C), temperature source Oral, resp. rate 16, weight 68.5 kg (151 lb 0.2 oz), SpO2 96 %. Physical Exam:  Constitutional: No distress . Vital signs reviewed. HEENT: EOMI, oral membranes moist Neck: supple Cardiovascular: RRR without murmur. No JVD    Respiratory: CTA Bilaterally without wheezes or rales. Normal effort    GI: BS +, non-tender, non-distended  Musc: No edema or tenderness in extremities. Neurological: She is alert  Improving language.  Speaking in short sentences and phrases now. Right central 7 Dysarthria Motor RUE 3-/5 proximal to distal RLE: 3+ to 4-/5 HF, 4/5 distally LUE and LLE 4 to 4+/5. Apraxic Skin: Skin is warm and dry.  Psychiatric: pleasant and cooperative  Assessment/Plan: 1. Functional deficits secondary to left thalamic/internal capsule hemorrhage which require 3+ hours per day of interdisciplinary therapy in a comprehensive inpatient rehab setting. Physiatrist is providing close team supervision and 24 hour management of active medical problems listed below. Physiatrist and  rehab team continue to assess barriers to discharge/monitor patient progress toward functional and medical goals.  Function:  Bathing Bathing position   Position: Shower  Bathing parts Body parts bathed by patient: Right arm, Chest, Abdomen, Right upper leg, Left upper leg, Left arm, Front perineal area, Buttocks Body parts bathed by helper: Right lower leg, Left lower leg, Back  Bathing assist Assist Level: Touching or steadying assistance(Pt > 75%)(Mod assist)      Upper Body Dressing/Undressing Upper body dressing   What is the patient wearing?: Pull over shirt/dress     Pull over shirt/dress - Perfomed by patient: Thread/unthread left sleeve, Put head through opening, Pull shirt over trunk, Thread/unthread right sleeve Pull over shirt/dress - Perfomed by helper: Thread/unthread right sleeve        Upper body assist Assist Level: Supervision or verbal cues      Lower Body Dressing/Undressing Lower body dressing   What is the patient wearing?: Pants, Shoes, Socks, Non-skid slipper socks     Pants- Performed by patient: Thread/unthread right pants leg, Thread/unthread left pants leg Pants- Performed by helper: Pull pants up/down   Non-skid slipper socks- Performed by helper: Don/doff right sock, Don/doff left sock   Socks - Performed by helper: Don/doff right sock, Don/doff left sock Shoes - Performed by patient: Don/doff right shoe, Don/doff left shoe Shoes - Performed by helper: Fasten right, Fasten left, Don/doff right shoe, Don/doff left shoe       TED Hose - Performed by helper: Don/doff right TED hose, Don/doff left TED  hose  Lower body assist Assist for lower body dressing: (Mod assist)      Toileting Toileting Toileting activity did not occur: No continent bowel/bladder event Toileting steps completed by patient: Adjust clothing prior to toileting, Adjust clothing after toileting Toileting steps completed by helper: Adjust clothing prior to toileting, Performs  perineal hygiene, Adjust clothing after toileting Toileting Assistive Devices: Grab bar or rail  Toileting assist Assist level: Two helpers   Transfers Chair/bed transfer   Chair/bed transfer method: Stand pivot Chair/bed transfer assist level: Touching or steadying assistance (Pt > 75%) Chair/bed transfer assistive device: Armrests Mechanical lift: Stedy   Locomotion Ambulation Ambulation activity did not occur: (P) Safety/medical concerns   Max distance: 10 ft Assist level: Touching or steadying assistance (Pt > 75%)   Wheelchair   Type: Manual Max wheelchair distance: 50 ft Assist Level: Moderate assistance (Pt 50 - 74%)  Cognition Comprehension Comprehension assist level: Understands basic 25 - 49% of the time/ requires cueing 50 - 75% of the time  Expression Expression assist level: Expresses basic 25 - 49% of the time/requires cueing 50 - 75% of the time. Uses single words/gestures.  Social Interaction Social Interaction assist level: Interacts appropriately 25 - 49% of time - Needs frequent redirection.  Problem Solving Problem solving assist level: Solves basic 50 - 74% of the time/requires cueing 25 - 49% of the time  Memory Memory assist level: Recognizes or recalls 25 - 49% of the time/requires cueing 50 - 75% of the time   Medical Problem List and Plan: 1.  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  DVT Prophylaxis/Anticoagulation: SCDs. 3. Pain Management: Tylenol as needed 4. Mood: Provide emotional support 5. Neuropsych: This patient is not capable of making decisions on her own behalf. 6. Skin/Wound Care: Routine skin checks 7. Fluids/Electrolytes/Nutrition: encourage PO intake  -hypokalemia: resolved   -kdur to 32mq  bid   -recheck 7/1 was 4.4   -intake 5220m   -check BMET and prealbumin today 8.  Atrial fibrillation.  Amiodarone 100 mg daily.  Cardiac rate controlled.  Eliquis on hold due to  hemorrhage 9.  Hypertension.  Coreg 12.5 mg twice daily, increased coreg to 18.7578mid   Vitals:   08/25/17 0544 08/25/17 0800  BP: (!) 129/58 (!) 153/67  Pulse: (!) 54 68  Resp:    Temp: 99.5 F (37.5 C)   SpO2: 96%25%systolic elevation monitor for brady 10.  Chronic diastolic congestive heart failure.    Filed Weights   08/23/17 0433 08/24/17 0555 08/25/17 0551  Weight: 69 kg (152 lb 1.9 oz) 68.5 kg (151 lb 0.2 oz) 68.5 kg (151 lb 0.2 oz)   Weights stable but eating  little 11.  Hypothyroidism.  Synthroid 12.  Renal insufficiency.  Baseline creatinine 1.39.  Creatinine 1.04 on 6/28, 1.21 on 7/1,  Continue to monitor, 13.  Hyperlipidemia.  Lipitor 14. Acute blood loss anemia  Hemoglobin 11.2 on 6/27, 11.9 upon admit to acute, stable at 11.4  Continue to monitor 15.  FLexor wtihdrawl spasms: cont  tizanidine 2mg47mD   -?botox hamstrings- pt aphasic but indicates her spasms are better   LOS (Days)  [facility]  [name], MD 08/25/2017 8:11 AM

## 2017-08-25 NOTE — Progress Notes (Signed)
Occupational Therapy Session Note  Patient Details  Name: Sara Russell MRN: [id number] Date of Birth: December 10, 1930  Today's Date: 08/25/2017 OT Individual Time: 5797-2820 and 1355-1425 OT Individual Time Calculation (min): 55 min and 30 min   Short Term Goals: Week 3:  OT Short Term Goal 1 (Week 3): STG = LTGs due to remaining LOS  Skilled Therapeutic Interventions/Progress Updates:    1) Treatment session with focus on ADL retraining and increased standing tolerance during self-care tasks.  Pt received upright in bed willing to engage in bathing/dressing at sink this session.  Completed stand pivot transfer with mod assist fading to min for sit > stand and then stand pivot to w/c.  Engaged in bathing and dressing at sink with pt demonstrating increased initiation and sequencing with bathing with fewer cues from therapist.  Pt donned pants this session with setup assist and min/steady assist when standing with pt able to wash buttocks and then pull pants over hips this session.  Pt demonstrating improved standing tolerance and midline orientation during LB bathing and dressing.  Issued elastic shoe laces and then pt able to don shoes without assistance this session.  Engaged in grooming tasks with pt utilizing RUE primarily when brushing hair but used LUE for oral care.  Pt left upright in w/c with seat belt alarm on and all needs in reach.  2) Treatment session with focus on functional use of RUE and dynamic standing balance.  Pt received upright in w/c willing to engage in therapy session.  Engaged in sit > stand at high-low table with min assist and min cues for sequencing.  Completed pattern replication in standing with focus on visual scanning to Rt to complete pattern, pt making increasingly more errors in Rt visual field than Lt.  Utilized RUE to pick up checker pieces and place them in container on Rt, pt able to complete with increased time and effort.  Returned to room and pt reports need  to toilet.  Completed stand pivot transfer bed <> BSC with mod assist and max cues for sequencing of stand pivot transfer.  Pt able to doff pants, but due to pushing to Rt in standing post toileting pt required mod-max assist to maintain standing and assistance for hygiene and clothing.  Returned to bed and left semi-reclined with all needs in reach.  Therapy Documentation Precautions:  Precautions Precautions: Fall Precaution Comments: watch BP / visual deficits; hx of spinal compression fxs (osteoporosis); L ankle fixed 2/2 ORIF 15 years ago Restrictions Weight Bearing Restrictions: No General:   Vital Signs: Therapy Vitals Pulse Rate: 68 BP: (!) 153/67 Pain:  Pt with no c/o pain  See Function Navigator for Current Functional Status.   Therapy/Group: Individual Therapy  Simonne Come 08/25/2017, 9:55 AM

## 2017-08-25 NOTE — Progress Notes (Signed)
Speech Language Pathology Daily Session Note  Patient Details  Name: Sara Russell MRN: [id number] Date of Birth: February 10, 1931  Today's Date: 08/25/2017 SLP Individual Time: 0930-1015 SLP Individual Time Calculation (min): 45 min  Short Term Goals: Week 3: SLP Short Term Goal 1 (Week 3): Pt will demonstrate selective attention in mildly distracting environment for ~ 30 minutes with Mod A cues.  SLP Short Term Goal 2 (Week 3): Pt will complete basic familiar tasks with supervision cues.  SLP Short Term Goal 3 (Week 3): Pt will scan to right of midline to locate objects in 8 of 10 opportunities with Supervision cues.  SLP Short Term Goal 4 (Week 3): Pt will verbalize at the phrase level during nonstructured tasks with mod assist multimodal cues.    Skilled Therapeutic Interventions:Skilled ST services focused on cognitive skills. SLP facilitated basic problem solving utilizing simple PEG task and 3 step sequence cards, pt required min-supervision A verbal/question cues and min-supervision A verbal cues for error awareness. Pt required supervision A verbal cues to scan right during tasks. SLP facilitated verbalization at phrase level in structured tasks, verbal description of sequence cards, and in unstructured conversation, pt required mod A verbal cues and max-mod A verbal cues for awareness of verbal errors Pt demonstrated selective attention with mod a verbal cues in 30 minute intervals. Pt was left in room with call bell within reach and chair alarm set. Recommend to continue skilled ST services.      Function:  Eating Eating                 Cognition Comprehension Comprehension assist level: Understands basic 50 - 74% of the time/ requires cueing 25 - 49% of the time;Understands basic 75 - 89% of the time/ requires cueing 10 - 24% of the time  Expression   Expression assist level: Expresses basic 25 - 49% of the time/requires cueing 50 - 75% of the time. Uses single  words/gestures.;Expresses basic 50 - 74% of the time/requires cueing 25 - 49% of the time. Needs to repeat parts of sentences.  Social Interaction Social Interaction assist level: Interacts appropriately 75 - 89% of the time - Needs redirection for appropriate language or to initiate interaction.  Problem Solving Problem solving assist level: Solves basic 90% of the time/requires cueing < 10% of the time;Solves basic 75 - 89% of the time/requires cueing 10 - 24% of the time  Memory Memory assist level: Recognizes or recalls 25 - 49% of the time/requires cueing 50 - 75% of the time    Pain Pain Assessment Pain Score: 0-No pain  Therapy/Group: Individual Therapy  Faris Coolman  [facility] 08/25/2017, 11:14 AM

## 2017-08-25 NOTE — Progress Notes (Signed)
Physical Therapy Session Note  Patient Details  Name: Sara Russell MRN: [id number] Date of Birth: 14-Jan-1931  Today's Date: 08/25/2017 PT Individual Time: 1034-1130 PT Individual Time Calculation (min): 56 min   Short Term Goals: Week 3:  PT Short Term Goal 1 (Week 3): STG = LTG due to estimated d/c date.  Skilled Therapeutic Interventions/Progress Updates:  Pt received in w/c & agreeable to tx, no c/o pain reported during session. Pt completed car transfer at equal seat height via stand pivot with min assist with max cuing to sit on seat then transfer LE in/out of car vs stepping in. Pt utilized UE to assist RLE into car but required assistance bringing RLE out. Pt also requires cuing for hand placement and sequencing steps with BLE. Pt propelled w/c 160 ft + 100 ft with BLE with cuing for "left, right" and to fully advance RLE. Pt requires supervision and cuing to attend to R 2/2 inattention to body & space. Pt requires pillow behind her while pushing w/c to promote anterior pelvis. Gait x 25 ft + 25 ft with R PFRW & min assist with improving ability to weight shift L for RLE advancement but still some R lateral lean and significantly decreased gait speed. Therapist continues to educate pt on hand placement for increased safety during sit<>stand transfers. Pt with improving verbal communication during session. At end of session pt left sitting in w/c with belt alarm donned & all needs within reach.   Therapy Documentation Precautions:  Precautions Precautions: Fall Precaution Comments: watch BP / visual deficits; hx of spinal compression fxs (osteoporosis); L ankle fixed 2/2 ORIF 15 years ago Restrictions Weight Bearing Restrictions: No   See Function Navigator for Current Functional Status.   Therapy/Group: Individual Therapy  Waunita Schooner 08/25/2017, 12:14 PM

## 2017-08-26 ENCOUNTER — Inpatient Hospital Stay (HOSPITAL_COMMUNITY): Payer: Medicare Other

## 2017-08-26 ENCOUNTER — Inpatient Hospital Stay (HOSPITAL_COMMUNITY): Payer: Medicare Other | Admitting: Occupational Therapy

## 2017-08-26 ENCOUNTER — Inpatient Hospital Stay (HOSPITAL_COMMUNITY): Payer: Medicare Other | Admitting: Physical Therapy

## 2017-08-26 LAB — TROPONIN I: Troponin I: <0.03 ng/mL (ref <0.03)

## 2017-08-26 MED ORDER — HYPROMELLOSE (GONIOSCOPIC) 2.5 % OP SOLN: 1.0000 [drp] | OPHTHALMIC | 12 refills | Status: DC | PRN

## 2017-08-26 MED ORDER — PRO-STAT SUGAR FREE PO LIQD: 30.0000 mL | Freq: Every day | ORAL | 0 refills | Status: DC

## 2017-08-26 MED ORDER — POTASSIUM CHLORIDE 20 MEQ/15ML (10%) PO SOLN
10.0000 meq | Freq: Every day | ORAL | Status: DC
  Administered 2017-08-27: 10 meq via ORAL
  Filled 2017-08-26: qty 15

## 2017-08-26 MED ORDER — POTASSIUM CHLORIDE 20 MEQ/15ML (10%) PO SOLN: 20.0000 meq | Freq: Two times a day (BID) | ORAL | 0 refills | Status: DC

## 2017-08-26 MED ORDER — ENSURE ENLIVE PO LIQD: 237.0000 mL | Freq: Two times a day (BID) | ORAL | 12 refills | Status: DC

## 2017-08-26 MED ORDER — TIZANIDINE HCL 2 MG PO TABS: 2.0000 mg | ORAL_TABLET | Freq: Three times a day (TID) | ORAL | 0 refills | Status: DC

## 2017-08-26 MED ORDER — SODIUM CHLORIDE 0.9 % IV SOLN
75.0000 mL/h | INTRAVENOUS | Status: DC
  Administered 2017-08-26: 75 mL/h via INTRAVENOUS

## 2017-08-26 MED ORDER — BETHANECHOL CHLORIDE 10 MG PO TABS: 10.0000 mg | ORAL_TABLET | Freq: Three times a day (TID) | ORAL | Status: DC

## 2017-08-26 MED ORDER — PANTOPRAZOLE SODIUM 40 MG PO TBEC
40.0000 mg | DELAYED_RELEASE_TABLET | Freq: Every day | ORAL | Status: DC
  Administered 2017-08-26 – 2017-08-27 (×2): 40 mg via ORAL
  Filled 2017-08-26 (×2): qty 1

## 2017-08-26 NOTE — Progress Notes (Signed)
[facility] PHYSICAL MEDICINE & REHABILITATION     PROGRESS NOTE    Subjective/Complaints:  Remains aphasic Pt points to chest and takes a deep breath but denies CP, SOB, N/V/D, ? Accuracy of Y/N  ROS: limited due to language/communication   Objective:  No results found. No results for input(s): WBC, HGB, HCT, PLT in the last 72 hours. Recent Labs    08/25/17 0802  NA 137  K 4.1  CL 102  GLUCOSE 110*  BUN 44*  CREATININE 1.34*  CALCIUM 9.1   CBG (last 3)  No results for input(s): GLUCAP in the last 72 hours.  Wt Readings from Last 3 Encounters:  08/26/17 69 kg (152 lb 1.9 oz)  08/02/17 70.1 kg (154 lb 8.7 oz)  05/05/17 70.3 kg (155 lb)     Intake/Output Summary (Last 24 hours) at 08/26/2017 0827 Last data filed at 08/25/2017 2145 Gross per 24 hour  Intake 610 ml  Output 900 ml  Net -290 ml    Vital Signs: Blood pressure (!) 130/48, pulse (!) 55, temperature 98 F (36.7 C), temperature source Oral, resp. rate 18, weight 69 kg (152 lb 1.9 oz), SpO2 96 %. Physical Exam:  Constitutional: No distress .appears tired  Vital signs reviewed. HEENT: EOMI, oral membranes moist Neck: supple Cardiovascular: RRR without murmur. No JVD    Respiratory: CTA Bilaterally without wheezes or rales. Normal effort    GI: BS +, non-tender, non-distended  Musc: No edema or tenderness in extremities.Equivocal tenderness over the chest  Neurological: She is alert  Improving language.  Speaking in short sentences and phrases now. Right central 7 Dysarthria Motor RUE 3-/5 proximal to distal RLE: 3+ to 4-/5 HF, 4/5 distally LUE and LLE 4 to 4+/5. Apraxic Skin: Skin is warm and dry.  Psychiatric: pleasant and cooperative  Assessment/Plan: 1. Functional deficits secondary to left thalamic/internal capsule hemorrhage which require 3+ hours per day of interdisciplinary therapy in a comprehensive inpatient rehab setting. Physiatrist is providing close team supervision and 24 hour  management of active medical problems listed below. Physiatrist and rehab team continue to assess barriers to discharge/monitor patient progress toward functional and medical goals.  Function:  Bathing Bathing position   Position: Wheelchair/chair at sink  Bathing parts Body parts bathed by patient: Right arm, Chest, Abdomen, Right upper leg, Left upper leg, Left arm, Front perineal area, Buttocks Body parts bathed by helper: Back, Left lower leg, Right lower leg  Bathing assist Assist Level: Touching or steadying assistance(Pt > 75%)(min assist)      Upper Body Dressing/Undressing Upper body dressing   What is the patient wearing?: Pull over shirt/dress     Pull over shirt/dress - Perfomed by patient: Thread/unthread left sleeve, Put head through opening, Pull shirt over trunk, Thread/unthread right sleeve Pull over shirt/dress - Perfomed by helper: Thread/unthread right sleeve        Upper body assist Assist Level: Set up   Set up : To obtain clothing/put away  Lower Body Dressing/Undressing Lower body dressing   What is the patient wearing?: Pants, Shoes, Socks     Pants- Performed by patient: Thread/unthread right pants leg, Thread/unthread left pants leg, Pull pants up/down Pants- Performed by helper: Pull pants up/down   Non-skid slipper socks- Performed by helper: Don/doff right sock, Don/doff left sock   Socks - Performed by helper: Don/doff right sock, Don/doff left sock Shoes - Performed by patient: Don/doff right shoe, Don/doff left shoe Shoes - Performed by helper: Fasten right, Fasten left,  Don/doff right shoe, Don/doff left shoe       TED Hose - Performed by helper: Don/doff right TED hose, Don/doff left TED hose  Lower body assist Assist for lower body dressing: Touching or steadying assistance (Pt > 75%)(mod assist)      Toileting Toileting Toileting activity did not occur: No continent bowel/bladder event Toileting steps completed by patient: Adjust  clothing prior to toileting Toileting steps completed by helper: Adjust clothing prior to toileting, Performs perineal hygiene, Adjust clothing after toileting Toileting Assistive Devices: Grab bar or rail  Toileting assist Assist level: Touching or steadying assistance (Pt.75%)   Transfers Chair/bed transfer   Chair/bed transfer method: Stand pivot Chair/bed transfer assist level: Touching or steadying assistance (Pt > 75%) Chair/bed transfer assistive device: Armrests Mechanical lift: Stedy   Locomotion Ambulation Ambulation activity did not occur: (P) Safety/medical concerns   Max distance: 25 ft Assist level: Touching or steadying assistance (Pt > 75%)   Wheelchair   Type: Manual Max wheelchair distance: 160 ft (BLE) Assist Level: Supervision or verbal cues  Cognition Comprehension Comprehension assist level: Understands basic 50 - 74% of the time/ requires cueing 25 - 49% of the time, Understands basic 75 - 89% of the time/ requires cueing 10 - 24% of the time  Expression Expression assist level: Expresses basic 25 - 49% of the time/requires cueing 50 - 75% of the time. Uses single words/gestures., Expresses basic 50 - 74% of the time/requires cueing 25 - 49% of the time. Needs to repeat parts of sentences.  Social Interaction Social Interaction assist level: Interacts appropriately 75 - 89% of the time - Needs redirection for appropriate language or to initiate interaction.  Problem Solving Problem solving assist level: Solves basic 90% of the time/requires cueing < 10% of the time, Solves basic 75 - 89% of the time/requires cueing 10 - 24% of the time  Memory Memory assist level: Recognizes or recalls 25 - 49% of the time/requires cueing 50 - 75% of the time   Medical Problem List and Plan: 1.  Right side weakness with aphasia secondary to left thalamic/internal capsule hemorrhage, subcortical infarcts left frontal and parietal-occipital lobes felt to be secondary to uncontrolled  HTN, use of anticoagulation  -continue CIR PT, OT, SLP,progressing                             2.  DVT Prophylaxis/Anticoagulation: SCDs. 3. Pain Management: Tylenol as needed 4. Mood: Provide emotional support 5. Neuropsych: This patient is not capable of making decisions on her own behalf. 6. Skin/Wound Care: Routine skin checks 7. Fluids/Electrolytes/Nutrition: encourage PO intake  -hypokalemia: resolved   -kdur to 60mq bid   -recheck 7/1 was 4.4   -intake 5237m   -check BMET and prealbumin today 8.  Atrial fibrillation.  Amiodarone 100 mg daily.  Cardiac rate controlled.  Eliquis on hold due to  hemorrhage 9.  Hypertension.  Coreg 12.5 mg twice daily, increased coreg to 18.7546mid   Vitals:   08/25/17 2018 08/26/17 0354  BP: 109/60 (!) 130/48  Pulse: 80 (!) 55  Resp: 18 18  Temp: 98 F (36.7 C) 98 F (36.7 C)  SpO2: 97%16%%38%ystolic elevation monitor for brady 10.  Chronic diastolic congestive heart failure.    Filed Weights   08/24/17 0555 08/25/17 0551 08/26/17 0354  Weight: 68.5 kg (151 lb 0.2 oz) 68.5 kg (151 lb 0.2 oz) 69 kg (152 lb 1.9  oz)   Weights stable but eating  little 11.  Hypothyroidism.  Synthroid 12.  Renal insufficiency.  Baseline creatinine 1.39.  Creatinine 1.04 on 6/28, 1.21 on 7/1,  Continue to monitor, 13.  Hyperlipidemia.  Lipitor 14. Acute blood loss anemia  Hemoglobin 11.2 on 6/27, 11.9 upon admit to acute, stable at 11.4  Continue to monitor 15.  FLexor wtihdrawl spasms: cont  tizanidine 51m QID   -?botox hamstrings- pt aphasic but indicates her spasms are better  16.  ? CP difficult to assess due to aphasia will check EKG and one troponin- discussed with PT , bedside tx for now LOS (Days) 2SchofieldEVALUATION WAS PERFORMED  ACharlett Blake MD 08/26/2017 8:27 AM

## 2017-08-26 NOTE — Progress Notes (Signed)
Social Work Patient ID: Sara Russell, female   DOB: August 07, 1930, 84 y.o.   MRN: [id number]  MD feels pt will be medically stable for transfer tomorrow and have spoken with [facility] she would like pt at 11:00. Son is aware and will see pt at facility. Pam-PA aware of discharge summary needed. Work on transfer for tomorrow.

## 2017-08-26 NOTE — Discharge Summary (Signed)
Physician Discharge Summary  Patient ID: Sara Russell MRN: [id number] DOB/AGE: [birth year] 84 y.o.  Admit date: 08/06/2017 Discharge date: 08/27/2017  Discharge Diagnoses:  Principal Problem:   Thalamic hemorrhage ([place]) Active Problems:   UTI (urinary tract infection), bacterial   Stage 3 chronic kidney disease ([place])   Acute blood loss anemia   Essential hypertension   Labile blood pressure   Hypokalemia   Urinary retention with incomplete bladder emptying   Acute on chronic renal failure [facility])   Discharged Condition: stable   Significant Diagnostic Studies: Ct Head Wo Contrast  Result Date: 08/03/2017 CLINICAL DATA:  84 y/o  F; follow-up of intracranial hemorrhage. EXAM: CT HEAD WITHOUT CONTRAST TECHNIQUE: Contiguous axial images were obtained from the base of the skull through the vertex without intravenous contrast. COMPARISON:  08/03/2017 MRI of the head.  08/02/2017 CT of the head. FINDINGS: Brain: Stable acute hemorrhage centered within the left thalamus measuring 1.9 x 1.7 x 1.4 cm (AP x ML x CC series 3, image 14 and series 5, image 31) when compared with the prior CT of the head given differences in technique. Mild interval increase in surrounding vasogenic edema with 4 mm of left-to-right midline shift of septum pellucidum. No new acute hemorrhage, infarct, or focal mass effect. Stable background of chronic microvascular ischemic changes and parenchymal volume loss of the brain. No extra-axial collection, hydrocephalus, or effacement of basilar cisterns. Vascular: Calcific atherosclerosis of carotid siphons and vertebral arteries. No hyperdense vessel identified. Skull: Normal. Negative for fracture or focal lesion. Sinuses/Orbits: No acute finding. Other: Bilateral intra-ocular lens replacement. IMPRESSION: 1. Stable acute hemorrhage centered within left thalamus. Mild increase in edema and mass effect from 08/02/2017 with 4 mm left-to-right midline shift, previously 3 mm. No  herniation. 2. No new acute intracranial abnormality. 3. Stable chronic microvascular ischemic changes and parenchymal volume loss of the brain. Electronically Signed   By: Kristine Garbe M.D.   On: 08/03/2017 20:43   Mr Brain Wo Contrast  Result Date: 08/03/2017 CLINICAL DATA:  Initial evaluation for acute intracranial hemorrhage. EXAM: MRI HEAD WITHOUT CONTRAST TECHNIQUE: Multiplanar, multiecho pulse sequences of the brain and surrounding structures were obtained without intravenous contrast. COMPARISON:  Prior CT from 08/02/2017. FINDINGS: Brain: Study moderately degraded by motion artifact. Acute intraparenchymal hematoma centered at the left thalamus/internal capsule again seen, relatively stable in size measuring approximately 1.9 x 2.0 x 2.5 cm. Localized vasogenic edema and mass effect slightly increased from previous. Mild mass effect on the adjacent third ventricle with trace localized left-to-right shift. No intraventricular extension. No appreciable underlying mass or other abnormality. Atrophy with chronic microvascular ischemic changes. Small foci of mildly hyperintense DWI signal at the anterior left frontal lobe (series 5, image 71) as well as the left parieto-occipital region (series 5, image 59) consistent with small subacute ischemic infarcts. No other evidence for acute or subacute ischemia. Gray-white matter differentiation otherwise maintained. Several scattered chronic micro hemorrhages seen within the cerebellum, deep gray nuclei, and bilateral cerebral hemispheres, favored to be hypertensive in nature. No mass lesion. Ventricles normal in size without hydrocephalus. No extra-axial fluid collection. Incidental note made of a partially empty sella. Vascular: Major intracranial vascular flow voids are maintained. Skull and upper cervical spine: Craniocervical junction within normal limits. Upper cervical spine normal. Bone marrow signal intensity within normal limits. Scalp soft  tissues unremarkable. Sinuses/Orbits: Globes and orbital soft tissues within normal limits. Patient status post ocular lens extraction bilaterally. Mild ethmoidal sinus mucosal thickening. Paranasal sinuses are otherwise  clear. No mastoid effusion. Inner ear structures normal. Other: None. IMPRESSION: 1. Similar size of acute left thalamic intraparenchymal hemorrhage, measuring 1.9 x 2.0 x 2.5 cm. Mildly increased localized edema with trace left-to-right midline shift. No intraventricular extension, hydrocephalus, or other complication identified. No underlying mass or other abnormality. 2. Two subcentimeter subacute ischemic cortical infarcts involving the left frontal and parietooccipital regions as above. 3. Additional scattered chronic micro hemorrhages involving the cerebral and cerebellar hemispheres, favored to be secondary to chronic uncontrolled hypertension. 4. Atrophy with chronic microvascular ischemic disease. Electronically Signed   By: Jeannine Boga M.D.   On: 08/03/2017 04:45   Dg Pelvis Portable  Result Date: 08/03/2017 CLINICAL DATA:  Right hip pain EXAM: PORTABLE PELVIS 1-2 VIEWS COMPARISON:  09/18/2014 CT FINDINGS: Pubic symphysis and rami are intact. SI joints are patent. No fracture or malalignment. Vascular calcifications. IMPRESSION: No acute osseous abnormality. Electronically Signed   By: Donavan Foil M.D.   On: 08/03/2017 01:43   Dg Femur Port, Min 2 Views Right  Result Date: 08/03/2017 CLINICAL DATA:  Right hip pain today EXAM: RIGHT FEMUR PORTABLE 2 VIEW COMPARISON:  None. FINDINGS: Mild irregularity at the right inferior pubic ramus. Vascular calcification. No fracture or malalignment. Sclerosis in the subarticular proximal medial tibia. IMPRESSION: 1. No acute osseous abnormality. 2. Mild irregularity of the right inferior pubic ramus is questionable for fracture. Electronically Signed   By: Donavan Foil M.D.   On: 08/03/2017 01:45   Ct Head Code Stroke Wo  Contrast  Addendum Date: 08/02/2017   ADDENDUM REPORT: 08/02/2017 19:40 ADDENDUM: Critical Value/emergent results were called by telephone at the time of interpretation on 08/02/2017 at 7:39 pm to Dr. Cheral Marker, who verbally acknowledged these results. Electronically Signed   By: Kristine Garbe M.D.   On: 08/02/2017 19:40   Result Date: 08/02/2017 CLINICAL DATA:  Code stroke.  84 y/o  F; right facial droop. EXAM: CT HEAD WITHOUT CONTRAST TECHNIQUE: Contiguous axial images were obtained from the base of the skull through the vertex without intravenous contrast. COMPARISON:  01/28/2017 CT head. FINDINGS: Brain: Acute hemorrhage centered within left thalamus/posterior limb of internal capsule measuring 1.9 x 1.7 x 1.5 cm (volume = 2.5 cm^3). Mild surrounding edema and local mass effect with 3 mm of left-to-right midline shift of septum pellucidum. Stable background of chronic microvascular ischemic changes and parenchymal volume loss of the brain. No extra-axial hemorrhage, large acute stroke, hydrocephalus, or herniation. Vascular: Calcific atherosclerosis of the carotid siphons and vertebral arteries. Hyperdense vessel identified. Skull: Normal. Negative for fracture or focal lesion. Sinuses/Orbits: No acute finding. Other: Bilateral intra-ocular lens replacement. IMPRESSION: 1. Acute hemorrhage centered within left thalamus/posterior limb of internal capsule measuring up to 1.9 cm, 2.5 cc. Mild local mass effect. 2. Stable chronic microvascular ischemic changes and parenchymal volume loss of the brain. 3. ASPECTS is not applicable. Electronically Signed: By: Kristine Garbe M.D. On: 08/02/2017 19:29    Labs:  Basic Metabolic Panel: BMP Latest Ref Rng & Units 08/27/2017 08/25/2017 08/15/2017  Glucose 70 - 99 mg/dL 97 110(H) 102(H)  BUN 8 - 23 mg/dL 51(H) 44(H) 26(H)  Creatinine 0.44 - 1.00 mg/dL 1.29(H) 1.34(H) 1.30(H)  BUN/Creat Ratio 12 - 28 - - -  Sodium 135 - 145 mmol/L 138 137 135   Potassium 3.5 - 5.1 mmol/L 4.4 4.1 4.3  Chloride 98 - 111 mmol/L 107 102 102  CO2 22 - 32 mmol/L 23 26 25   Calcium 8.9 - 10.3 mg/dL 8.6(L) 9.1 8.8(L)  CBC: CBC Latest Ref Rng & Units 08/15/2017 08/07/2017 08/02/2017  WBC 4.0 - 10.5 K/uL 8.2 9.7 -  Hemoglobin 12.0 - 15.0 g/dL 11.4(L) 11.2(L) 12.2  Hematocrit 36.0 - 46.0 % 35.3(L) 33.1(L) 36.0  Platelets 150 - 400 K/uL 301 188 -    CBG: No results for input(s): GLUCAP in the last 168 hours.  Brief HPI:   Jerre Diguglielmo is an 84 year old female with history of mild renal insuffiency, CAF- on eliquis, DVT/PE, chronic diastolic CHF who was admitted on 08/02/17 with right facial droop and left sided weakness due to acute left thalamic IPH with mild localized edema.  Blood pressure was elevated at admission and she was started on Cleviprex for BP control. Eliquis discontinued and with recommendations to repeat CT head in one month and consider resumption in 4-6 weeks per Dr. Leonie Man.  Patient continues to be limited by aphasia, left-sided weakness and CIR was recommended due to functional decline   Hospital Course: JAELLA WEINERT was admitted to rehab 08/06/2017 for inpatient therapies to consist of PT, ST and OT at least three hours five days a week. Past admission physiatrist, therapy team and rehab RN have worked together to provide customized collaborative inpatient rehab.  Blood pressures have been monitored on twice daily basis and Coreg has been titrated upwards.  Follow-up labs showed significant hypokalemia with potassium at 2.8 and this was supplemented aggressively.  Hypokalemia has resolved and supplement was decreased due to upward trend.  She has had issues with urinary retention requiring I/O caths 3-4 times day. Recommend monitoring voiding function with PVR checks and cath for volumes > 350 cc. She developed Klebsiella UTI and was treated with 7-day course of Keflex.  Recommend setting a toileting schedule as she continues to be  incontinent of bowel and bladder.  She has issues with spasms BLE and Zanaflex was added to help with symptoms. Weights have been monitored daily and no signs of fluid overload noted. Weights currently ranging  151- 152 lbs.   Acute blood loss anemia has been monitored with serial checks and is stable. Heart rate has been controlled on amiodarone 100 mg daily and Coreg twice daily.  P.o. intake has been poor and supplements were offered to help maintain adequate nutritional and hydration status. Repeat labs showed worsening of renal status with acute renal failure and she was treated with a liter IVF last night. Recommend offering fluids frequently and hydration at nights if renal status does not improve or if intake less than 1000 cc/day.  She continues to be limited by significant deficits and family has elected on SNF for further therapy. She was discharged to [facility] skilled care on 08/27/17 for progressive therapy.    Rehab course: During patient's stay in rehab weekly team conferences were held to monitor patient's progress, set goals and discuss barriers to discharge. At admission, patient required max assist for ADL tasks and total assist with mobility.  She exhibited moderate expressive and receptive aphasia with speech characterized by fluent jargon with paraphasia and perseveration of words.  She  has had improvement in activity tolerance, balance, postural control as well as ability to compensate for deficits.  She is able to complete ADL tasks with min assist, mod to max assist for toileting and mod assist with  shower transfers.   She requires min assist for transfers and is able to ambulate 25' with min assist and right PFRW.  She is showing increased in ability to follow commands, perform basic  and ADL tasks and is showing improvement in ability to communicate.    Disposition: Skilled Nursing Facility  Diet: Regular  Special Instructions: 1. Continue to monitor renal status daily.  Recommend IVF at 70 cc/hr X 12 hours at nights if intake less than 1000cc/day. 2. Toilet patient every 3-4 hours and monitor PVRs.  Cath for volumes > 350 cc.    Discharge Instructions    Ambulatory referral to Physical Medicine Rehab   Complete by:  As directed    Follow-up 1 month patient for skilled nursing facility left thalamic hemorrhage     Allergies as of 08/27/2017      Reactions   Iohexol Hives   Ivp Dye [iodinated Diagnostic Agents] Hives   Was able to tolerate Contrast Media for CT scan.   Scallops [shellfish Allergy] Nausea And Vomiting   Projectile vomiting   Shellfish-derived Products Nausea And Vomiting   Sulfa Antibiotics Hives   Sulfamethoxazole Hives      Medication List    STOP taking these medications   aspirin 81 MG EC tablet   omeprazole 40 MG capsule Commonly known as:  PRILOSEC     TAKE these medications   amiodarone 100 MG tablet Commonly known as:  PACERONE Take 1 tablet (100 mg total) by mouth daily.   atorvastatin 10 MG tablet Commonly known as:  LIPITOR Take 1 tablet (10 mg total) by mouth daily.   bethanechol 10 MG tablet Commonly known as:  URECHOLINE Take 1 tablet (10 mg total) by mouth 3 (three) times daily.   carvedilol 6.25 MG tablet Commonly known as:  COREG Take 3 tablets (18.75 mg total) by mouth 2 (two) times daily with a meal. What changed:    medication strength  how much to take   feeding supplement (ENSURE ENLIVE) Liqd Take 237 mLs by mouth 2 (two) times daily between meals.   feeding supplement (PRO-STAT SUGAR FREE 64) Liqd Take 30 mLs by mouth daily at 3 pm.   GAS RELIEF PO Take 1 tablet by mouth 2 (two) times daily.   hydroxypropyl methylcellulose / hypromellose 2.5 % ophthalmic solution Commonly known as:  ISOPTO TEARS / GONIOVISC Place 1 drop into both eyes as needed for dry eyes.   levothyroxine 75 MCG tablet Commonly known as:  SYNTHROID, LEVOTHROID Take 1 tablet (75 mcg total) by mouth daily.    pantoprazole 40 MG tablet Commonly known as:  PROTONIX Take 1 tablet (40 mg total) by mouth daily. Start taking on:  08/28/2017   potassium chloride 20 MEQ/15ML (10%) Soln Take 7.5 mLs (10 mEq total) by mouth daily. Start taking on:  08/28/2017   senna-docusate 8.6-50 MG tablet Commonly known as:  Senokot-S Take 1 tablet by mouth 2 (two) times daily.   tiZANidine 2 MG tablet Commonly known as:  ZANAFLEX Take 1 tablet (2 mg total) by mouth 4 (four) times daily -  with meals and at bedtime.   traZODone 50 MG tablet Commonly known as:  DESYREL Take 0.5-1 tablets (25-50 mg total) by mouth at bedtime as needed for sleep.   TYLENOL ARTHRITIS PAIN 650 MG CR tablet Generic drug:  acetaminophen Take 650 mg by mouth every 8 (eight) hours as needed for pain.       Contact information for follow-up providers    Kirsteins, Luanna Salk, MD Follow up.   Specialty:  Physical Medicine and Rehabilitation Why:  Office to call for appointment Contact information: [place] [address] [phone]  Garvin Fila, MD Follow up.   Specialties:  Neurology, Radiology Why:  Call for appointment 6 weeks Contact information: [address] [place] [address] [phone]        Thompson Grayer, MD Follow up.   Specialty:  Cardiology Why:  Call for appointment Contact information: [address] [place] [address] [phone]            Contact information for after-discharge care    Destination    [facility] .   Service:  Skilled Nursing Contact information: [place] [phone]                  Signed: Bary Leriche 08/27/2017, 10:58 AM

## 2017-08-26 NOTE — Progress Notes (Signed)
Occupational Therapy Discharge Summary  Patient Details  Name: Sara Russell MRN: [id number] Date of Birth: 1931/02/10  Patient has met 6 of 8 long term goals due to improved activity tolerance, improved balance, postural control, ability to compensate for deficits, functional use of  RIGHT upper extremity and improved attention.  Patient to discharge at [facility] Assist level, Mod assist toileting and shower transfers.  Patient's care partner unavailable to provide the necessary physical and cognitive assistance at discharge.    Reasons goals not met: Pt continues to require mod-max assist with toileting and mod assist shower transfer.  Recommendation:  Patient will benefit from ongoing skilled OT services in skilled nursing facility setting to continue to advance functional skills in the area of BADL and Reduce care partner burden.  Equipment: No equipment provided  Reasons for discharge: treatment goals met and discharge from hospital  Patient/family agrees with progress made and goals achieved: Yes  OT Discharge Precautions/Restrictions  Precautions Precautions: Fall Restrictions Weight Bearing Restrictions: No General OT Amount of Missed Time: 12 Minutes PT Missed Treatment Reason: Patient unwilling to participate Vital Signs Therapy Vitals Temp: 98.6 F (37 C) Temp Source: Oral Pulse Rate: 60 Resp: 14 BP: (!) 156/71 Patient Position (if appropriate): Sitting Oxygen Therapy SpO2: 98 % O2 Device: Room Air Pain   ADL See Function Navigator Vision Baseline Vision/History: No visual deficits Vision Assessment?: Vision impaired- to be further tested in functional context Ocular Range of Motion: Within Functional Limits Alignment/Gaze Preference: Within Defined Limits Tracking/Visual Pursuits: Able to track stimulus in all quads without difficulty Perception  Perception: Impaired Inattention/Neglect: Other (comment)(decreased attention to Rt, much improved from  eval) Praxis Praxis: Impaired Praxis Impairment Details: Motor planning Cognition Overall Cognitive Status: Impaired/Different from baseline Arousal/Alertness: Awake/alert Orientation Level: Oriented to person;Oriented to place(oriented to hospital when given choice of 2) Selective Attention: Impaired Memory: Impaired Awareness: Impaired Awareness Impairment: Emergent impairment;Anticipatory impairment Problem Solving: Impaired Problem Solving Impairment: Verbal basic;Functional basic Self Monitoring: Impaired Safety/Judgment: Impaired Sensation Sensation Light Touch: Appears Intact Proprioception: Impaired by gross assessment Coordination Gross Motor Movements are Fluid and Coordinated: No Fine Motor Movements are Fluid and Coordinated: No Finger Nose Finger Test: impaired RUE Motor  Motor Motor: Hemiplegia Motor - Discharge Observations: generalized weakness & deconditioning Mobility  Bed Mobility Bed Mobility: Rolling Right;Rolling Left;Supine to Sit;Sit to Supine Rolling Right: Supervision/verbal cueing(bedrails) Rolling Left: Supervision/Verbal cueing(bedrails) Supine to Sit: Supervision/Verbal cueing(HOB elevated) Sit to Supine: Supervision/Verbal cueing(HOB elevated) Transfers Sit to Stand: Minimal Assistance - Patient > 75%  Trunk/Postural Assessment  Cervical Assessment Cervical Assessment: Exceptions to WFL(forward head) Thoracic Assessment Thoracic Assessment: Exceptions to WFL(kyphosis) Lumbar Assessment Lumbar Assessment: Exceptions to WFL(posterior pelvic tilt) Postural Control Postural Control: Deficits on evaluation Righting Reactions: impaired Protective Responses: impaired  Balance Balance Balance Assessed: Yes Static Sitting Balance Static Sitting - Level of Assistance: 5: Stand by assistance Dynamic Standing Balance Dynamic Standing - Level of Assistance: 4: Min assist(gait with R PFRW) Extremity/Trunk Assessment RUE Assessment RUE  Assessment: Exceptions to [facility] Passive Range of Motion (PROM) Comments: WFL Active Range of Motion (AROM) Comments: shoulder flexion limited to grossly 60*, elbow WFL, loose gross grasp General Strength Comments: strength grossly 3/5, loose gross grasp.  Limited assessment due to aphasia RUE Body System: Neuro Brunstrum levels for arm and hand: Arm;Hand Brunstrum level for arm: Stage IV Movement is deviating from synergy Brunstrum level for hand: Stage V Independence from basic synergies LUE Assessment LUE Assessment: Within Functional Limits   See Function Navigator  for Current Functional Status.  Sara Russell 08/26/2017, 4:53 PM

## 2017-08-26 NOTE — Progress Notes (Signed)
Physical Therapy Discharge Summary  Patient Details  Name: Sara Russell MRN: [id number] Date of Birth: Jun 24, 1930  Today's Date: 08/26/2017 PT Individual Time: [id number] and [id number] PT Individual Time Calculation (min): 42 min and 56 min    Patient has met 12 of 13 long term goals due to improved activity tolerance, improved balance, improved postural control, increased strength, ability to compensate for deficits, functional use of  right upper extremity and right lower extremity, improved attention, improved awareness and improved coordination.  Patient to currently requires min assist for short distance gait (< or = 25 ft) with R PFRW, supervision for w/c mobility in controlled environment.  Reasons goals not met: impaired cognition  Recommendation:  Patient will benefit from ongoing skilled PT services in skilled nursing facility setting to continue to advance safe functional mobility, address ongoing impairments in endurance, strength, dynamic standing balance, transfers, progress gait with LRAD, midline orientation, cognitive deficits, and minimize fall risk.  Equipment: No equipment provided, TBD in next venue of care.  Reasons for discharge: treatment goals met and discharge from hospital  Patient/family agrees with progress made and goals achieved: Yes  Skilled PT Treatment: Treatment 1: Pt received in bed & agreeable to tx. Pt transfers to EOB with supervision and extra time. Pt wishing to eat breakfast and set up with meal tray. Pt utilized RUE to support herself during sitting balance with intermittent R lateral lean and was able to attend to objects on R side of tray. Pt holding chest multiple times during session but denies pain & SOB but unsure how accurate pt's responses are 2/2 global aphasia. Therapist provided assistance with donning shirt, pants and shoes. Attempted to assist pt with sit>stand to pull pants over hips but pt reporting inability to do so. MD present in  room & made aware of pt intermittently holding chest - MD recommends bed level exercises at this time and has ordered an EKG. Pt transferred sit>supine with supervision and performed rolling L<>R with bed rails and supervision to allow pt and therapist to assist with pulling pants over hips. Attempted to engage pt in BLE exercises with therapist providing AAROM for RLE heel slides and encouraged pt to perform LLE heel slides but pt stating "can't you just leave me alone". Pt left in bed with alarm set & all needs within reach.   Treatment 2:  Per OT, PA (Pam) cleared pt for participation in therapy. Attempted to see pt but NT reporting need to cath pt. Therapist returned later & pt received in bed & reluctantly agreeable to tx. No c/o pain reported. Pt transfers to EOB with hospital bed features, extra time, and supervision. Therapist assists with donning shoes and transfers to w/c via stand pivot with min assist. Pt stood with heavy mod assist 2/2 R lateral while fixing pants. Transported pt to gym where she ambulated 17 ft with R PFRW and min assist with very significant decreased gait speed. Pt with improving weight shifting to L to allow clearance of RLE. Pt negotiated 12 steps (3" + 6") with B rails and min assist to ascend stairs, mod assist to descend stairs with max cuing for compensatory pattern when descending stairs, mod cuing for RUE management, and extra time. Pt propels w/c back to room with BLE & supervision with cuing to attend to obstacles on R and extra time. At end of session pt left set up with lunch, alarm belt donned, & all needs within reach.   PT Discharge Precautions/Restrictions Precautions Precautions:  Fall Restrictions Weight Bearing Restrictions: No  Vision/Perception  Perception Perception: (decreased attention to R) Praxis Praxis: Impaired Praxis Impairment Details: Motor planning   Cognition Overall Cognitive Status: Impaired/Different from  baseline Arousal/Alertness: Awake/alert Orientation Level: Oriented to person;Oriented to place(oriented to hospital when given choice of 2) Selective Attention: Impaired Memory: Impaired Awareness: Impaired Awareness Impairment: Emergent impairment;Anticipatory impairment Problem Solving: Impaired Problem Solving Impairment: Verbal basic;Functional basic Self Monitoring: Impaired Safety/Judgment: Impaired  Sensation Coordination Gross Motor Movements are Fluid and Coordinated: No Fine Motor Movements are Fluid and Coordinated: No  Motor  Motor Motor: Hemiplegia Motor - Discharge Observations: generalized weakness & deconditioning   Mobility Bed Mobility Bed Mobility: Rolling Right;Rolling Left;Supine to Sit;Sit to Supine Rolling Right: Supervision/verbal cueing(bedrails) Rolling Left: Supervision/Verbal cueing(bedrails) Supine to Sit: Supervision/Verbal cueing(HOB elevated) Sit to Supine: Supervision/Verbal cueing(HOB elevated) Transfers Transfers: Sit to Stand;Stand Pivot Transfers Sit to Stand: Minimal Assistance - Patient > 75% Stand Pivot Transfers: Minimal Assistance - Patient > 75% Stand Pivot Transfer Details: Verbal cues for sequencing;Verbal cues for technique;Verbal cues for precautions/safety;Tactile cues for weight shifting  Locomotion  Gait Ambulation: Yes Gait Assistance: Minimal Assistance - Patient > 75% Gait Distance (Feet): (max of 25 ft during LOS) Assistive device: Right platform walker Gait Assistance Details: Verbal cues for sequencing;Verbal cues for technique Gait Gait: Yes Gait Pattern: Impaired Gait Pattern: (decreased weight shifting L<>R, decreased foot clearance RLE, decreased step length BLE, decreased stride length) Stairs / Additional Locomotion Stairs: Yes Stairs Assistance: Moderate Assistance - Patient 50 - 74% Stair Management Technique: Two rails Number of Stairs: 12 Height of Stairs: (6" + 3") Wheelchair Mobility Wheelchair  Mobility: Yes Wheelchair Assistance: Chartered loss adjuster: Both lower extermities Wheelchair Parts Management: Needs assistance Distance: 150 ft    Trunk/Postural Assessment  Cervical Assessment Cervical Assessment: Exceptions to WFL(forward head) Thoracic Assessment Thoracic Assessment: Exceptions to WFL(kyphosis) Lumbar Assessment Lumbar Assessment: Exceptions to WFL(posterior pelvic tilt) Postural Control Postural Control: Deficits on evaluation Righting Reactions: impaired Protective Responses: impaired   Balance Balance Balance Assessed: Yes Static Sitting Balance Static Sitting - Level of Assistance: 5: Stand by assistance Dynamic Standing Balance Dynamic Standing - Level of Assistance: 4: Min assist(gait with R PFRW)  Extremity Assessment  RUE Assessment RUE Assessment: Within Functional Limits Difficult to assess all strength 2/2 cognitive impairments.  RLE Assessment RLE Assessment: Exceptions to [facility] Passive Range of Motion (PROM) Comments: 0 degrees ankle DF 2/2 hx surgery General Strength Comments: grossly 3+/5 as pt able to weight bear with no buckling noted LLE Assessment LLE Assessment: Exceptions to [facility] Passive Range of Motion (PROM) Comments: 0 degrees ankle DF due to ORIF Active Range of Motion (AROM) Comments: 0 degrees ankle DF due to ORIF General Strength Comments: grossly 3+/5 as pt able to weight bear with no buckling noted   See Function Navigator for Current Functional Status.  Waunita Schooner 08/26/2017, 4:24 PM

## 2017-08-26 NOTE — Progress Notes (Signed)
Social Work  Discharge Note  The overall goal for the admission was met for:   Discharge location: Yes-[facility] Length of Stay: Yes  Discharge activity level: Yes-21 DAYS  Home/community participation: Yes-MIN-MOD LEVEL  Services provided included: MD, RD, PT, OT, SLP, RN, CM, Pharmacy and SW  Financial Services: [place]  Follow-up services arranged: Other: [facility]  Comments (or additional information):PT NEEDS MORE REHAB AND LIVES AT [place]  Patient/Family verbalized understanding of follow-up arrangements: Yes  Individual responsible for coordination of the follow-up plan: JIM-SON  Confirmed correct DME delivered: Elease Hashimoto 08/26/2017    Elease Hashimoto

## 2017-08-26 NOTE — Progress Notes (Signed)
Occupational Therapy Session Note  Patient Details  Name: Sara Russell MRN: [id number] Date of Birth: 1930/05/26  Today's Date: 08/26/2017 OT Individual Time: 6681-5947 OT Individual Time Calculation (min): 48 min  and Today's Date: 08/26/2017 OT Missed Time: 12 Minutes Missed Time Reason: Patient fatigue   Short Term Goals: Week 3:  OT Short Term Goal 1 (Week 3): STG = LTGs due to remaining LOS  Skilled Therapeutic Interventions/Progress Updates:    Treatment session with focus on participation in self-care tasks and functional use of dominant RUE.  Pt received supine in bed with RN present providing pt with morning medications.  Pt already dressed with PT, therefore completed bed mobility with supervision with mod encouragement.  Pt completed stand pivot transfer to w/c with min assist with increased time.  Engaged in oral care and brushing hair from seated position in w/c.  Pt able to brush hair with RUE with increased time, but continues to brush teeth with LUE.   Returned to bed per pt request, but willing to continue to address RUE with encouragement.  Engaged in [place] and fine and gross motor activity with picking up blocks and placing in box.  Pt completed 10 in 39 seconds and when attempted to time a second time, pt picking up 2-3 blocks at a time and laughing when therapist attempted to correct to one at a time.  Pt declined any further intervention at this time, therefore left semi-reclined in bed with all needs in reach.  MD reports EKG "looks fine" and per confirmation with PA okay to participate in therapies to pt tolerance as she is not complaining of pain.  Therapy Documentation Precautions:  Precautions Precautions: Fall Precaution Comments: watch BP / visual deficits; hx of spinal compression fxs (osteoporosis); L ankle fixed 2/2 ORIF 15 years ago Restrictions Weight Bearing Restrictions: No General: General OT Amount of Missed Time: 12 Minutes Pain: Pain  Assessment Pain Score: 0-No pain  See Function Navigator for Current Functional Status.   Therapy/Group: Individual Therapy  Simonne Come 08/26/2017, 12:59 PM

## 2017-08-26 NOTE — Progress Notes (Signed)
Speech Language Pathology Discharge Summary  Patient Details  Name: Sara Russell MRN: [id number] Date of Birth: 05/12/1930  Today's Date: 08/26/2017 SLP Individual Time: 1000-1045 SLP Individual Time Calculation (min): 45 min   Skilled Therapeutic Interventions: Skilled ST services focused on speech skills. SLP facilitated expressive communication skills at phrase level about current medical condition and recent EKG order, pt required mod A verbal cues. SLP facilitated basic problem solving skills with use of common objects from [employer]ARK toolkit, pt required supervision-min A verbal cues and mod A verbal cues for use at phrase level. SLP facilitated speech at phrase level utilizing picture description task requiring mod A verbal cues. Pt was left in bed with alarm set and call bell within reach.     Patient has met 4 of 4 long term goals.  Patient to discharge at overall Mod level.  Reasons goals not met:     Clinical Impression/Discharge Summary:  Pt demonstrated great progress meeting 4 out 4 goals, discharging at Mod A. Pt demonstrated increase in oral control consuming least restrictive diet of thin liquids and regular textured foods. Pt demonstrated increase in ability to follow commands, perform ADLs and familiar tasks, selective attention, name common objects and communicate at phrase level with increase of awareness of semantic and phonemic paraphasias errors. Pt would continue to benefit from skilled ST services in order to maximize functional independence and reduce burden of care requiring skilled ST services at next level of care and 24/hour supervision.   Care Partner:  Caregiver Able to Provide Assistance: Yes  Type of Caregiver Assistance: Physical;Cognitive  Recommendation:  Skilled Nursing facility;24 hour supervision/assistance  Rationale for SLP Follow Up: Maximize functional communication;Maximize cognitive function and independence;Reduce caregiver burden   Equipment:      Reasons for discharge: Discharged from hospital   Patient/Family Agrees with Progress Made and Goals Achieved: Yes   Function:  Eating Eating                 Cognition Comprehension Comprehension assist level: Understands basic 75 - 89% of the time/ requires cueing 10 - 24% of the time;Understands basic 50 - 74% of the time/ requires cueing 25 - 49% of the time  Expression   Expression assist level: Expresses basic 50 - 74% of the time/requires cueing 25 - 49% of the time. Needs to repeat parts of sentences.  Social Interaction Social Interaction assist level: Interacts appropriately 75 - 89% of the time - Needs redirection for appropriate language or to initiate interaction.  Problem Solving Problem solving assist level: Solves basic 75 - 89% of the time/requires cueing 10 - 24% of the time;Solves basic 50 - 74% of the time/requires cueing 25 - 49% of the time  Memory Memory assist level: Recognizes or recalls 25 - 49% of the time/requires cueing 50 - 75% of the time      08/26/2017, 11:48 AM    

## 2017-08-27 DIAGNOSIS — R339 Retention of urine, unspecified: Secondary | ICD-10-CM

## 2017-08-27 DIAGNOSIS — N183 Chronic kidney disease, stage 3 (moderate): Secondary | ICD-10-CM

## 2017-08-27 DIAGNOSIS — N179 Acute kidney failure, unspecified: Secondary | ICD-10-CM

## 2017-08-27 DIAGNOSIS — N189 Chronic kidney disease, unspecified: Secondary | ICD-10-CM

## 2017-08-27 LAB — BASIC METABOLIC PANEL
Anion gap: 8 (ref 5–15)
BUN: 51 mg/dL — ABNORMAL HIGH (ref 8–23)
CO2: 23 mmol/L (ref 22–32)
Calcium: 8.6 mg/dL — ABNORMAL LOW (ref 8.9–10.3)
Chloride: 107 mmol/L (ref 98–111)
Creatinine, Ser: 1.29 mg/dL — ABNORMAL HIGH (ref 0.44–1.00)
GFR calc Af Amer: 43 mL/min — ABNORMAL LOW (ref >60)
GFR calc non Af Amer: 37 mL/min — ABNORMAL LOW (ref >60)
Glucose, Bld: 97 mg/dL (ref 70–99)
Potassium: 4.4 mmol/L (ref 3.5–5.1)
Sodium: 138 mmol/L (ref 135–145)

## 2017-08-27 MED ORDER — CARVEDILOL 6.25 MG PO TABS: 18.0000 mg | ORAL_TABLET | Freq: Two times a day (BID) | ORAL | Status: DC

## 2017-08-27 MED ORDER — PANTOPRAZOLE SODIUM 40 MG PO TBEC: 40.0000 mg | DELAYED_RELEASE_TABLET | Freq: Every day | ORAL | Status: DC

## 2017-08-27 MED ORDER — POTASSIUM CHLORIDE 20 MEQ/15ML (10%) PO SOLN: 10.0000 meq | Freq: Every day | ORAL | 0 refills | Status: DC

## 2017-08-27 NOTE — Progress Notes (Signed)
DC'd per ambulance to [facility] rehab part. Report given to nurse at [facility] at [phone]

## 2017-08-27 NOTE — Progress Notes (Signed)
Nutrition Follow-up  DOCUMENTATION CODES:   Not applicable  INTERVENTION:    Continue Ensure Enlive po BID, each supplement provides 350 kcal and 20 grams of protein.  Continue 30 ml Prostat po once daily, each supplement provides 100 kcal and 15 grams of protein.   Encourage adequate PO intake.   NUTRITION DIAGNOSIS:   Increased nutrient needs related to chronic illness(CHF) as evidenced by estimated needs.  Ongoing  GOAL:   Patient will meet greater than or equal to 90% of their needs  Progressing  MONITOR:   PO intake, Supplement acceptance, Labs, Weight trends, I & O's, Skin  REASON FOR ASSESSMENT:   Malnutrition Screening Tool    ASSESSMENT:    84 year old right-handed female with history of mild renal insufficiency with creatinine 4.36, chronic diastolic congestive heart failure, atrial fibrillation maintained on amiodarone as well as Eliquis, DVT/PE, hypertension. Presented 08/02/2017 with right sided weakness, facial droop and aphasia.  Blood pressure 221/89.  MRI reviewed showed left thalamic hemorrhage.   Meal completions charted 50-100% for pt's last 8 meals. Appetite progressing per pt reports. She tolerated eggs and bacon this morning Pt consuming Ensure Enlive BID. Intake show to be adequate. Will continue with current interventions and monitor for needs. Pt to d/c to SNF today.   Medications reviewed and include: 20 mEq KCl once/daily Labs reviewed.   Diet Order:   Diet Order           Diet regular Room service appropriate? Yes; Fluid consistency: Thin  Diet effective now          EDUCATION NEEDS:   Not appropriate for education at this time  Skin:  Skin Assessment: Reviewed RN Assessment  Last BM:  08/27/17  Height:   Ht Readings from Last 1 Encounters:  08/02/17 5' 4"  (1.626 m)    Weight:   Wt Readings from Last 1 Encounters:  08/27/17 152 lb 8.9 oz (69.2 kg)    Ideal Body Weight:  54.5 kg  BMI:  Body mass index is 26.19  kg/m.  Estimated Nutritional Needs:   Kcal:  1650-1800  Protein:  70-80 grams  Fluid:  Per MD    Mariana Single RD, LDN Clinical Nutrition Pager # [phone]

## 2017-08-27 NOTE — Progress Notes (Addendum)
[facility]IL[facility]     PROGRESS NOTE    Subjective/Complaints: Remains aphasic   ROS: limited due to language/communication   Objective:  No results found. No results for input(s): WBC, HGB, HCT, PLT in the last 72 hours. Recent Labs    08/25/17 0802 08/27/17 0632  NA 137 138  K 4.1 4.4  CL 102 107  GLUCOSE 110* 97  BUN 44* 51*  CREATININE 1.34* 1.29*  CALCIUM 9.1 8.6*   CBG (last 3)  No results for input(s): GLUCAP in the last 72 hours.  Wt Readings from Last 3 Encounters:  08/27/17 69.2 kg (152 lb 8.9 oz)  08/02/17 70.1 kg (154 lb 8.7 oz)  05/05/17 70.3 kg (155 lb)     Intake/Output Summary (Last 24 hours) at 08/27/2017 0930 Last data filed at 08/27/2017 0413 Gross per 24 hour  Intake 480 ml  Output 1000 ml  Net -520 ml    Vital Signs: Blood pressure (!) 131/53, pulse 62, temperature 98 F (36.7 C), temperature source Oral, resp. rate 18, weight 69.2 kg (152 lb 8.9 oz), SpO2 97 %. Physical Exam:  Constitutional: No distress .appears tired  Vital signs reviewed. HEENT: EOMI, oral membranes moist Neck: supple Cardiovascular: RRR without murmur. No JVD    Respiratory: CTA Bilaterally without wheezes or rales. Normal effort    GI: BS +, non-tender, non-distended  Musc: No edema or tenderness in extremities.Equivocal tenderness over the chest  Neurological: She is alert  Improving language.  Speaking in short sentences and phrases now. Right central 7 Dysarthria Motor RUE 3-/5 proximal to distal RLE: 3+ to 4-/5 HF, 4/5 distally LUE and LLE 4 to 4+/5. Apraxic Skin: Skin is warm and dry.  Psychiatric: pleasant and cooperative  Assessment/Plan: 1. Functional deficits secondary to left thalamic/internal capsule hemorrhage  Stable for D/C to  SNF Function:  Bathing Bathing position   Position: Wheelchair/chair at sink  Bathing parts Body parts bathed by patient: Right arm, Chest, Abdomen, Right upper leg, Left upper leg,  Left arm, Front perineal area, Buttocks Body parts bathed by helper: Back, Left lower leg, Right lower leg  Bathing assist Assist Level: Touching or steadying assistance(Pt > 75%)(min assist)      Upper Body Dressing/Undressing Upper body dressing   What is the patient wearing?: Pull over shirt/dress     Pull over shirt/dress - Perfomed by patient: Thread/unthread left sleeve, Put head through opening, Pull shirt over trunk, Thread/unthread right sleeve Pull over shirt/dress - Perfomed by helper: Thread/unthread right sleeve        Upper body assist Assist Level: Set up   Set up : To obtain clothing/put away  Lower Body Dressing/Undressing Lower body dressing   What is the patient wearing?: Pants, Shoes     Pants- Performed by patient: Thread/unthread right pants leg, Thread/unthread left pants leg, Pull pants up/down Pants- Performed by helper: Pull pants up/down   Non-skid slipper socks- Performed by helper: Don/doff right sock, Don/doff left sock   Socks - Performed by helper: Don/doff right sock, Don/doff left sock Shoes - Performed by patient: Don/doff right shoe, Don/doff left shoe Shoes - Performed by helper: Fasten right, Fasten left, Don/doff right shoe, Don/doff left shoe       TED Hose - Performed by helper: Don/doff right TED hose, Don/doff left TED hose  Lower body assist Assist for lower body dressing: Touching or steadying assistance (Pt > 75%)      Toileting Toileting Toileting activity did not  occur: No continent bowel/bladder event Toileting steps completed by patient: Adjust clothing prior to toileting Toileting steps completed by helper: Adjust clothing prior to toileting, Performs perineal hygiene, Adjust clothing after toileting Toileting Assistive Devices: Grab bar or rail  Toileting assist Assist level: Touching or steadying assistance (Pt.75%)   Transfers Chair/bed transfer   Chair/bed transfer method: Stand pivot Chair/bed transfer assist  level: Touching or steadying assistance (Pt > 75%) Chair/bed transfer assistive device: Armrests Mechanical lift: Stedy   Locomotion Ambulation Ambulation activity did not occur: (P) Safety/medical concerns   Max distance: 17 ft  Assist level: Touching or steadying assistance (Pt > 75%)   Wheelchair   Type: Manual Max wheelchair distance: 150 ft (BLE) Assist Level: Supervision or verbal cues  Cognition Comprehension Comprehension assist level: Understands basic 50 - 74% of the time/ requires cueing 25 - 49% of the time  Expression Expression assist level: Expresses basic 25 - 49% of the time/requires cueing 50 - 75% of the time. Uses single words/gestures.  Social Interaction Social Interaction assist level: Interacts appropriately 50 - 74% of the time - May be physically or verbally inappropriate.  Problem Solving Problem solving assist level: Solves basic 75 - 89% of the time/requires cueing 10 - 24% of the time, Solves basic 50 - 74% of the time/requires cueing 25 - 49% of the time  Memory Memory assist level: Recognizes or recalls 25 - 49% of the time/requires cueing 50 - 75% of the time   Medical Problem List and Plan: 1.  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  DVT Prophylaxis/Anticoagulation: SCDs. 3. Pain Management: Tylenol as needed 4. Mood: Provide emotional support 5. Neuropsych: This patient is not capable of making decisions on her own behalf. 6. Skin/Wound Care: Routine skin checks 7. Fluids/Electrolytes/Nutrition: encourage PO intake  -hypokalemia: resolved   -kdur to 63mq bid   -recheck 7/1 was 4.4   -intake 5262m   -check BMET and prealbumin today 8.  Atrial fibrillation.  Amiodarone 100 mg daily.  Cardiac rate  controlled.  Eliquis on hold due to  hemorrhage 9.  Hypertension.  Coreg 12.5 mg twice daily, increased coreg to 18.7554mid   Vitals:   08/26/17 2026 08/27/17 0400  BP: 136/69 (!) 131/53  Pulse: 63 62  Resp: 18 18  Temp: 98 F (36.7 C) 98 F (36.7 C)  SpO2: 100416%%60%ystolic elevation monitor for brady 10.  Chronic diastolic congestive heart failure.    Filed Weights   08/25/17 0551 08/26/17 0354 08/27/17 0400  Weight: 68.5 kg (151 lb 0.2 oz) 69 kg (152 lb 1.9 oz) 69.2 kg (152 lb 8.9 oz)   Weights stable but eating  little 11.  Hypothyroidism.  Synthroid 12.  Renal insufficiency.  Baseline creatinine 1.39.BUN is elevated due to poor fluid intake, may benefit from hs IVF ~23m69mr hr x 12h if staff cannot encourage at least 1000ml9mfluid daily  Creatinine 1.04 on 6/28, 1.21 on 7/1,  Continue to monitor, 13.  Hyperlipidemia.  Lipitor 14. Acute blood loss anemia  Hemoglobin 11.2 on 6/27, 11.9 upon admit to acute, stable at 11.4  Continue to monitor 15.  FLexor  wtihdrawl spasms: cont  tizanidine 46m QID   -?botox hamstrings- pt aphasic but indicates her spasms are better  16. Questionable  CP, aphasia limits hx, EKG unremarkable, troponin normal LOS (Days) 21 A FACE TO FACE EVALUATION WAS PERFORMED  ACharlett Blake MD 08/27/2017 9:30 AM

## 2017-08-28 ENCOUNTER — Non-Acute Institutional Stay (SKILLED_NURSING_FACILITY): Payer: Medicare Other | Admitting: Adult Health

## 2017-08-28 ENCOUNTER — Encounter: Payer: Self-pay | Admitting: Adult Health

## 2017-08-28 DIAGNOSIS — I69151 Hemiplegia and hemiparesis following nontraumatic intracerebral hemorrhage affecting right dominant side: Secondary | ICD-10-CM | POA: Diagnosis not present

## 2017-08-28 DIAGNOSIS — I608 Other nontraumatic subarachnoid hemorrhage: Secondary | ICD-10-CM | POA: Diagnosis not present

## 2017-08-28 DIAGNOSIS — R339 Retention of urine, unspecified: Secondary | ICD-10-CM | POA: Diagnosis not present

## 2017-08-28 DIAGNOSIS — I10 Essential (primary) hypertension: Secondary | ICD-10-CM

## 2017-08-28 DIAGNOSIS — I5031 Acute diastolic (congestive) heart failure: Secondary | ICD-10-CM | POA: Diagnosis not present

## 2017-08-28 DIAGNOSIS — R278 Other lack of coordination: Secondary | ICD-10-CM | POA: Diagnosis not present

## 2017-08-28 DIAGNOSIS — I5032 Chronic diastolic (congestive) heart failure: Secondary | ICD-10-CM | POA: Diagnosis not present

## 2017-08-28 DIAGNOSIS — K219 Gastro-esophageal reflux disease without esophagitis: Secondary | ICD-10-CM

## 2017-08-28 DIAGNOSIS — R2689 Other abnormalities of gait and mobility: Secondary | ICD-10-CM | POA: Diagnosis not present

## 2017-08-28 DIAGNOSIS — N183 Chronic kidney disease, stage 3 unspecified: Secondary | ICD-10-CM

## 2017-08-28 DIAGNOSIS — D62 Acute posthemorrhagic anemia: Secondary | ICD-10-CM

## 2017-08-28 DIAGNOSIS — I69320 Aphasia following cerebral infarction: Secondary | ICD-10-CM | POA: Diagnosis not present

## 2017-08-28 DIAGNOSIS — S06360S Traumatic hemorrhage of cerebrum, unspecified, without loss of consciousness, sequela: Secondary | ICD-10-CM | POA: Diagnosis not present

## 2017-08-28 DIAGNOSIS — I69051 Hemiplegia and hemiparesis following nontraumatic subarachnoid hemorrhage affecting right dominant side: Secondary | ICD-10-CM | POA: Diagnosis not present

## 2017-08-28 DIAGNOSIS — I69198 Other sequelae of nontraumatic intracerebral hemorrhage: Secondary | ICD-10-CM | POA: Diagnosis not present

## 2017-08-28 DIAGNOSIS — I610 Nontraumatic intracerebral hemorrhage in hemisphere, subcortical: Secondary | ICD-10-CM | POA: Diagnosis not present

## 2017-08-28 DIAGNOSIS — I481 Persistent atrial fibrillation: Secondary | ICD-10-CM

## 2017-08-28 DIAGNOSIS — I4819 Other persistent atrial fibrillation: Secondary | ICD-10-CM

## 2017-08-28 DIAGNOSIS — M62561 Muscle wasting and atrophy, not elsewhere classified, right lower leg: Secondary | ICD-10-CM | POA: Diagnosis not present

## 2017-08-28 DIAGNOSIS — I616 Nontraumatic intracerebral hemorrhage, multiple localized: Secondary | ICD-10-CM | POA: Diagnosis not present

## 2017-08-28 DIAGNOSIS — K8020 Calculus of gallbladder without cholecystitis without obstruction: Secondary | ICD-10-CM | POA: Diagnosis not present

## 2017-08-28 DIAGNOSIS — I69314 Frontal lobe and executive function deficit following cerebral infarction: Secondary | ICD-10-CM | POA: Diagnosis not present

## 2017-08-28 DIAGNOSIS — I69028 Other speech and language deficits following nontraumatic subarachnoid hemorrhage: Secondary | ICD-10-CM | POA: Diagnosis not present

## 2017-08-28 NOTE — Progress Notes (Signed)
Location:  Occupational psychologist of Service:  SNF (31) Provider:   Cindi Carbon, [place] [phone]   Hensel, Jamal Collin, MD  Patient Care Team: Zenia Resides, MD as PCP - General Benard Rink (Dentistry) [place], Heather, [place] (Optometry) Sydnee Levans, MD (Dermatology) Thompson Grayer, MD as Consulting Physician (Cardiology)  Extended Emergency Contact Information Primary Emergency Contact: [name] [place] Phone: [phone] Mobile Phone: [phone] Relation: Son Secondary Emergency Contact: [name] [place] Mobile Phone: [phone] Relation: Son  Code Status:  DNR Goals of care: Advanced Directive information Advanced Directives 08/06/2017  Does Patient Have a Medical Advance Directive? Yes  Type of Advance Directive Living will  Does patient want to make changes to medical advance directive? No - Patient declined  Copy of [place] in Chart? -  Would patient like information on creating a medical advance directive? -  Pre-existing out of facility DNR order (yellow form or pink MOST form) -     Chief Complaint  Patient presents with  . Acute Visit    f/u urinary retention s/p hospitalization for thalamic CVA    HPI:   Pt is a 84 y.o. female seen today for a hospital f/u s/p admission from 07/13/17-08/06/17 and follow up rehab stay at the hospital 08/06/17-08/27/17 due to a thalamic hemorrhage due to uncontrolled htn, and eliquis. She has a hx of HLD, HTN, multiple pulmonary nodules, anemia, hyponatremia, afib, CHF, PE, and CKD. She previously lived in independent living but was sent to the ER on 6/22 due to right facial droop, right sided weakness, and aphasia. CT showed an acute left thalamic/internal capsule/basal ganglia intraparenchymal hemorrhage.  As she was on Eliquis, it was reversed with Andexxa.  MRI showed a similar size of acute left thalamic  intraparenchymal hemorrhage, measuring 1.9 x 2.0 x 2.5 cm. Mildly increased localized edema with trace left-to-right midline shift.. Two subcentimeter subacute ischemic cortical infarcts involving the left frontal and parietooccipital regions.  2D echo showed an ef of 60-65% with no source of embolus, and carotid doppler showed 1-39% stenosis bilat. Her BP was elevated during her stay and treated with Cleviprex and later Coreg was increased to improve BP. She had issues with urinary retention and needed I/O cath 3-4 x day. She was treated for a Klebsiella UTI with Keflex. Remains incontinent of urine but making large voids here at [facility]. Unfortunately she has expressive/receptive aphasia and continues with right sided weakness. She remains pleasant but has confusion and not able to perform MMSE.  There were issues with leg spasms during her stay and Zanaflex was added which helped. Rehab notes indicated improvement in function and postural control. Therapy eval is pending here at [facility]. She is able to stand and transfer to the chair per staff.         Past Medical History:  Diagnosis Date  . Abdominal mass   . AKI (acute kidney injury) ([place]) 09/15/2014  . Atrial fibrillation ([place])   . Colitis, ulcerative ([place])   . Cough    WHITE SPUTUM FOR 2 WEEKS, NO FEVER  . Degenerative joint disease   . Diverticulosis   . DVT (deep venous thrombosis) ([place]) 2009   BOTH LUNGS  . GERD (gastroesophageal reflux disease)   . Hyperlipidemia   . Hyperplastic colon polyp   . Hypertension   . Hypothyroidism   . Iron deficiency anemia    ON IRON AT TIMES  .  PE (pulmonary embolism) 2009  . Persistent atrial fibrillation ([place])   . Positive H. pylori test 01/20/96  . S/P dilatation of esophageal stricture 1992  . Skin cancer    h/o basal and squamous skin cancer--> removed   Past Surgical History:  Procedure Laterality Date  . APPENDECTOMY    . BREAST EXCISIONAL BIOPSY Left 1992   benign  .  BREAST LUMPECTOMY     benign  . DILATION AND CURETTAGE OF UTERUS     for endometrial polyps  . ENDOSCOPIC RETROGRADE CHOLANGIOPANCREATOGRAPHY (ERCP) WITH PROPOFOL N/A 03/23/2015   Procedure: ENDOSCOPIC RETROGRADE CHOLANGIOPANCREATOGRAPHY (ERCP) WITH PROPOFOL;  Surgeon: Milus Banister, MD;  Location: [facility] ENDOSCOPY;  Service: Endoscopy;  Laterality: N/A;  . JOINT REPLACEMENT    . ORIF ANKLE FRACTURE Left    X 2  . REPLACEMENT TOTAL KNEE     left  . SURGERY DONE TO REMOVE LEFT ANKLE HARDWARE LATER    . TONSILLECTOMY      Allergies  Allergen Reactions  . Iohexol Hives  . Ivp Dye [Iodinated Diagnostic Agents] Hives    Was able to tolerate Contrast Media for CT scan.  [name] [Shellfish Allergy] Nausea And Vomiting    Projectile vomiting  . Shellfish-Derived Products Nausea And Vomiting  . Sulfa Antibiotics Hives  . Sulfamethoxazole Hives    Outpatient Encounter Medications as of 08/28/2017  Medication Sig  . Amino Acids-Protein Hydrolys (FEEDING SUPPLEMENT, PRO-STAT SUGAR FREE 64,) LIQD Take 30 mLs by mouth daily at 3 pm.  . amiodarone (PACERONE) 100 MG tablet Take 1 tablet (100 mg total) by mouth daily.  Marland Kitchen atorvastatin (LIPITOR) 10 MG tablet Take 1 tablet (10 mg total) by mouth daily.  . bethanechol (URECHOLINE) 10 MG tablet Take 1 tablet (10 mg total) by mouth 3 (three) times daily.  . carvedilol (COREG) 6.25 MG tablet Take 3 tablets (18.75 mg total) by mouth 2 (two) times daily with a meal.  . feeding supplement, ENSURE ENLIVE, (ENSURE ENLIVE) LIQD Take 237 mLs by mouth 2 (two) times daily between meals.  . hydroxypropyl methylcellulose / hypromellose (ISOPTO TEARS / GONIOVISC) 2.5 % ophthalmic solution Place 1 drop into both eyes as needed for dry eyes.  Marland Kitchen levothyroxine (SYNTHROID, LEVOTHROID) 75 MCG tablet Take 1 tablet (75 mcg total) by mouth daily.  . pantoprazole (PROTONIX) 40 MG tablet Take 1 tablet (40 mg total) by mouth daily.  . potassium chloride 20 MEQ/15ML (10%) SOLN  Take 7.5 mLs (10 mEq total) by mouth daily.  Marland Kitchen senna-docusate (SENOKOT-S) 8.6-50 MG tablet Take 1 tablet by mouth 2 (two) times daily.  . Simethicone (GAS RELIEF PO) Take 1 tablet by mouth 2 (two) times daily.  Marland Kitchen tiZANidine (ZANAFLEX) 2 MG tablet Take 1 tablet (2 mg total) by mouth 4 (four) times daily -  with meals and at bedtime.  . traZODone (DESYREL) 50 MG tablet Take 0.5-1 tablets (25-50 mg total) by mouth at bedtime as needed for sleep. (Patient taking differently: Take 25 mg by mouth at bedtime as needed for sleep. )  . acetaminophen (TYLENOL ARTHRITIS PAIN) 650 MG CR tablet Take 650 mg by mouth every 8 (eight) hours as needed for pain.   . [DISCONTINUED] omeprazole (PRILOSEC OTC) 20 MG tablet Take 20 mg by mouth 2 (two) times daily.     No facility-administered encounter medications on file as of 08/28/2017.     Review of Systems  Unable to perform ROS: Dementia    Immunization History  Administered Date(s) Administered  .  H1N1 03/04/2008  . Influenza Split 11/12/2010, 11/26/2011  . Influenza Whole 11/26/2006, 11/03/2007, 12/10/2007, 11/16/2008, 11/20/2009  . Influenza,inj,Quad PF,6+ Mos 11/24/2012, 11/29/2013, 10/14/2014, 11/28/2015, 12/04/2016  . PPD Test 10/03/2014  . Pneumococcal Conjugate-13 09/10/2013  . Pneumococcal Polysaccharide-23 02/12/2000  . Td 02/11/1998, 04/13/2008  . Zoster 05/08/2006   Pertinent  Health Maintenance Due  Topic Date Due  . INFLUENZA VACCINE  09/11/2017  . DEXA SCAN  Completed  . PNA vac Low Risk Adult  Completed   Fall Risk  02/12/2017 01/27/2017 12/19/2016 08/01/2016 06/27/2016  Falls in the past year? No No No No No  Number falls in past yr: - - - - -  Comment - - - - -   Functional Status Survey:    Vitals:   08/28/17 1047  BP: 127/69  Pulse: 60   There is no height or weight on file to calculate BMI. Physical Exam  Constitutional: No distress.  HENT:  Head: Normocephalic and atraumatic.  Nose: Nose normal.  Mouth/Throat:  Oropharynx is clear and moist. No oropharyngeal exudate.  Eyes: Pupils are equal, round, and reactive to light. Conjunctivae and EOM are normal. Right eye exhibits no discharge. Left eye exhibits no discharge.  Neck: No JVD present. No thyromegaly present.  Cardiovascular: Normal rate.  No murmur heard. irregular  Pulmonary/Chest: Effort normal and breath sounds normal. No respiratory distress. She has no wheezes.  Abdominal: Soft. Bowel sounds are normal. She exhibits no distension. There is no tenderness.  Musculoskeletal: She exhibits no edema, tenderness or deformity.  Lymphadenopathy:    She has no cervical adenopathy.  Neurological: She is alert.  Knew the month but not date or year. Knew her name but not place. Difficulty touching her nose during coordination test. Word finding and inappropriate word use during interview. Intermittently able to f/c.  No facial droop. Right upper arm weakness noted.   Skin: Skin is warm and dry. She is not diaphoretic.  Psychiatric: She has a normal mood and affect.  Nursing note and vitals reviewed.   Labs reviewed: Recent Labs    08/15/17 0652 08/25/17 0802 08/27/17 0632  NA 135 137 138  K 4.3 4.1 4.4  CL 102 102 107  CO2 25 26 23   GLUCOSE 102* 110* 97  BUN 26* 44* 51*  CREATININE 1.30* 1.34* 1.29*  CALCIUM 8.8* 9.1 8.6*   Recent Labs    01/31/17 1042 08/02/17 1907 08/07/17 0547  AST 22 21 17   ALT 14 13* 12  ALKPHOS 81 74 71  BILITOT 0.8 0.6 0.7  PROT 6.7 6.4* 5.7*  ALBUMIN 3.8 3.7 2.8*   Recent Labs    01/31/17 1042 08/02/17 1907 08/02/17 1915 08/07/17 0547 08/15/17 0652  WBC 9.4 7.1  --  9.7 8.2  NEUTROABS 7.8* 4.9  --  8.0*  --   HGB 11.6* 11.9* 12.2 11.2* 11.4*  HCT 34.1* 36.6 36.0 33.1* 35.3*  MCV 93.9 95.8  --  92.5 93.4  PLT 250 204  --  188 301   Lab Results  Component Value Date   TSH 3.160 06/27/2016   Lab Results  Component Value Date   HGBA1C 5.3 08/02/2013   Lab Results  Component Value Date    CHOL 89 09/16/2014   HDL 13 (L) 09/16/2014   LDLCALC 40 09/16/2014   LDLDIRECT 85 08/18/2008   TRIG 76 08/05/2017   CHOLHDL 6.8 09/16/2014    Significant Diagnostic Results in last 30 days:  No results found.  Assessment/Plan  1. Thalamic  hemorrhage ([place]) Off eliquis Remains with residual expressive aphasia, cognitive impairment, proprioceptive issues, and right arm weakness. Will or with PT, OT, and ST while here at [facility] re[facility]. Anticipate a skilled room will be needed.  F/U with Dr. Leonie Man as indicated  2. Urinary retention Voiding well per staff here at [facility], will check bladder scanner Currently on urecholine  3. Persistent atrial fibrillation ([facility]) Rate controlled with amiodarone  4. Chronic diastolic congestive heart failure ([place]) Compensated  5. Essential hypertension Controlled with Coreg  6. Calculus of gallbladder without cholecystitis without obstruction Noted on previous scans, not symptomatic  7. Stage 3 chronic kidney disease ([facility]) BMP in am due to hx of dehydration and ARF  8. Acute blood loss anemia Improved Check CBC in am  9. Gastroesophageal reflux disease without esophagitis W/hx of hiatal hernia Continue Protonix 40 mg qd  10. Hypothyroidism No recent TSH in records. Also on amiodarone, will check    Family/ staff Communication: staff/resident  Labs/tests ordered: CBC BMP TSH PVR

## 2017-08-29 DIAGNOSIS — E039 Hypothyroidism, unspecified: Secondary | ICD-10-CM | POA: Diagnosis not present

## 2017-08-29 DIAGNOSIS — I69198 Other sequelae of nontraumatic intracerebral hemorrhage: Secondary | ICD-10-CM | POA: Diagnosis not present

## 2017-08-29 DIAGNOSIS — I69320 Aphasia following cerebral infarction: Secondary | ICD-10-CM | POA: Diagnosis not present

## 2017-08-29 DIAGNOSIS — M62561 Muscle wasting and atrophy, not elsewhere classified, right lower leg: Secondary | ICD-10-CM | POA: Diagnosis not present

## 2017-08-29 DIAGNOSIS — Z79899 Other long term (current) drug therapy: Secondary | ICD-10-CM | POA: Diagnosis not present

## 2017-08-29 DIAGNOSIS — I69151 Hemiplegia and hemiparesis following nontraumatic intracerebral hemorrhage affecting right dominant side: Secondary | ICD-10-CM | POA: Diagnosis not present

## 2017-08-29 DIAGNOSIS — R2689 Other abnormalities of gait and mobility: Secondary | ICD-10-CM | POA: Diagnosis not present

## 2017-08-29 DIAGNOSIS — D649 Anemia, unspecified: Secondary | ICD-10-CM | POA: Diagnosis not present

## 2017-08-29 DIAGNOSIS — I69314 Frontal lobe and executive function deficit following cerebral infarction: Secondary | ICD-10-CM | POA: Diagnosis not present

## 2017-08-29 LAB — BASIC METABOLIC PANEL
BUN: 39 — AB (ref 4–21)
Creatinine: 1.2 — AB (ref 0.5–1.1)
Glucose: 97
Potassium: 3.8 (ref 3.4–5.3)
Sodium: 137 (ref 137–147)

## 2017-08-29 LAB — CBC AND DIFFERENTIAL
HCT: 34 — AB (ref 36–46)
Hemoglobin: 11.6 — AB (ref 12.0–16.0)
Platelets: 292 (ref 150–399)
WBC: 7.1

## 2017-08-29 LAB — TSH: TSH: 2.72 (ref 0.41–5.90)

## 2017-09-01 DIAGNOSIS — I69320 Aphasia following cerebral infarction: Secondary | ICD-10-CM | POA: Diagnosis not present

## 2017-09-01 DIAGNOSIS — I69198 Other sequelae of nontraumatic intracerebral hemorrhage: Secondary | ICD-10-CM | POA: Diagnosis not present

## 2017-09-01 DIAGNOSIS — I69314 Frontal lobe and executive function deficit following cerebral infarction: Secondary | ICD-10-CM | POA: Diagnosis not present

## 2017-09-01 DIAGNOSIS — R2689 Other abnormalities of gait and mobility: Secondary | ICD-10-CM | POA: Diagnosis not present

## 2017-09-01 DIAGNOSIS — I69151 Hemiplegia and hemiparesis following nontraumatic intracerebral hemorrhage affecting right dominant side: Secondary | ICD-10-CM | POA: Diagnosis not present

## 2017-09-01 DIAGNOSIS — M62561 Muscle wasting and atrophy, not elsewhere classified, right lower leg: Secondary | ICD-10-CM | POA: Diagnosis not present

## 2017-09-02 ENCOUNTER — Non-Acute Institutional Stay (SKILLED_NURSING_FACILITY): Payer: Medicare Other | Admitting: Internal Medicine

## 2017-09-02 ENCOUNTER — Encounter: Payer: Self-pay | Admitting: Internal Medicine

## 2017-09-02 DIAGNOSIS — N183 Chronic kidney disease, stage 3 unspecified: Secondary | ICD-10-CM

## 2017-09-02 DIAGNOSIS — I481 Persistent atrial fibrillation: Secondary | ICD-10-CM | POA: Diagnosis not present

## 2017-09-02 DIAGNOSIS — R4701 Aphasia: Secondary | ICD-10-CM

## 2017-09-02 DIAGNOSIS — I10 Essential (primary) hypertension: Secondary | ICD-10-CM | POA: Diagnosis not present

## 2017-09-02 DIAGNOSIS — K219 Gastro-esophageal reflux disease without esophagitis: Secondary | ICD-10-CM

## 2017-09-02 DIAGNOSIS — I5032 Chronic diastolic (congestive) heart failure: Secondary | ICD-10-CM | POA: Diagnosis not present

## 2017-09-02 DIAGNOSIS — R339 Retention of urine, unspecified: Secondary | ICD-10-CM

## 2017-09-02 DIAGNOSIS — I69151 Hemiplegia and hemiparesis following nontraumatic intracerebral hemorrhage affecting right dominant side: Secondary | ICD-10-CM

## 2017-09-02 DIAGNOSIS — M62561 Muscle wasting and atrophy, not elsewhere classified, right lower leg: Secondary | ICD-10-CM | POA: Diagnosis not present

## 2017-09-02 DIAGNOSIS — E039 Hypothyroidism, unspecified: Secondary | ICD-10-CM | POA: Diagnosis not present

## 2017-09-02 DIAGNOSIS — I69198 Other sequelae of nontraumatic intracerebral hemorrhage: Secondary | ICD-10-CM | POA: Diagnosis not present

## 2017-09-02 DIAGNOSIS — I4819 Other persistent atrial fibrillation: Secondary | ICD-10-CM

## 2017-09-02 DIAGNOSIS — I610 Nontraumatic intracerebral hemorrhage in hemisphere, subcortical: Secondary | ICD-10-CM | POA: Diagnosis not present

## 2017-09-02 DIAGNOSIS — I69314 Frontal lobe and executive function deficit following cerebral infarction: Secondary | ICD-10-CM | POA: Diagnosis not present

## 2017-09-02 DIAGNOSIS — R2689 Other abnormalities of gait and mobility: Secondary | ICD-10-CM | POA: Diagnosis not present

## 2017-09-02 DIAGNOSIS — I69320 Aphasia following cerebral infarction: Secondary | ICD-10-CM | POA: Diagnosis not present

## 2017-09-02 NOTE — Progress Notes (Signed)
Patient ID: Sara Russell, female   DOB: 1931-01-07, 84 y.o.   MRN: [id number]  Provider:  Rexene Edison. Mariea Clonts, D.O., C.M.D. Location:  [place] Room Number: [place] of Service:  SNF (31)  PCP: Zenia Resides, MD Patient Care Team: Zenia Resides, MD as PCP - General Benard Rink (Dentistry) [place], [place], [place] (Optometry) Sydnee Levans, MD (Dermatology) Thompson Grayer, MD as Consulting Physician (Cardiology)  Extended Emergency Contact Information Primary Emergency Contact: [name] [place] of [place] Phone: [phone] Mobile Phone: [phone] Relation: Son Secondary Emergency Contact: [name] [place] of [place] Mobile Phone: [phone] Relation: Son  Code Status: DNR Goals of Care: Advanced Directive information Advanced Directives 09/02/2017  Does Patient Have a Medical Advance Directive? Yes  Type of Paramedic of [place];Living will;Out of facility DNR (pink MOST or yellow form)  Does patient want to make changes to medical advance directive? No - Patient declined  Copy of [place] in Chart? Yes  Would patient like information on creating a medical advance directive? -  Pre-existing out of facility DNR order (yellow form or pink MOST form) Yellow form placed in chart (order not valid for inpatient use);Pink MOST form placed in chart (order not valid for inpatient use)   Chief Complaint  Patient presents with  . 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  She'd been admitted to [facility] initially with sudden onset of right  facial droop, right sided weakness and aphasia.  [place]  She was on eliquis for her afib and prior DVT/PE.  CT showed acute left thalamic/internal capsule/basal ganglia intraparenchymal hemorrhage.  Eliquis was reversed and she was admitted to neuro ICU for treatment.  MRI showed similar acute left thalmic intraparenechymal hemorrhage along with mildly increased lcalized edema with trace midline shift and two subacture ischemic cortical infarcts in her left frontal and pre-occipital regions.  Repeat CT was stable.  Carotid doppler showed bilateral ICA stenosis 1-39%.  2D echo showed EF 60-65% w/o cardiac source of embolus.  She is on lipitor.  BP controlled with meds.   She then went to inpatient rehab at [facility] and was then discharged to [facility] rehab.    Her stay in inpatient rehab was complicated by urinary retention requiring I/O caths bid.  She was noted to be pleasantly confused but MMSE could not be done due to her aphasia.  She had difficulty with leg spasms.  Eliquis was stopped due to her intracranial hemorrhage and neuro apparently recommended baby asa be started at d/c, but this was not included in the d/c summary.  F/u CT and reconsideration of eliquis at one month was suggested but also not arranged in discharge and pt given f/u neuro appt.   Staff note that she and family may want to revise her MOST form.  She does have a DNR form, as well.    She has been receiving PT, OT, ST since insurance verification.  She's had some improvement in her right hemiparesis thus far but continues with her significant aphasias.  With PT, she's ambulating with a rolling walker.  Past Medical History:  Diagnosis Date  . Abdominal mass   . AKI (acute kidney injury) ([place]) 09/15/2014  . Atrial fibrillation ([place])   . Colitis, ulcerative ([place])   . Cough    WHITE SPUTUM FOR 2 WEEKS, NO FEVER  . Degenerative joint disease   . Diverticulosis   . DVT (deep venous thrombosis) ([place])  2009   BOTH LUNGS  . GERD (gastroesophageal reflux disease)   . Hyperlipidemia   . Hyperplastic colon polyp   . Hypertension   . Hypothyroidism   . Iron deficiency anemia    ON IRON AT TIMES  . PE (pulmonary embolism) 2009  . Persistent atrial fibrillation ([place])   . Positive H. pylori test 01/20/96  . S/P dilatation of esophageal stricture 1992  . Skin cancer    h/o basal and squamous skin cancer--> removed   Past Surgical History:  Procedure Laterality Date  . APPENDECTOMY    . BREAST EXCISIONAL BIOPSY Left 1992   benign  . BREAST LUMPECTOMY     benign  . DILATION AND CURETTAGE OF UTERUS     for endometrial polyps  . ENDOSCOPIC RETROGRADE CHOLANGIOPANCREATOGRAPHY (ERCP) WITH PROPOFOL N/A 03/23/2015   Procedure: ENDOSCOPIC RETROGRADE CHOLANGIOPANCREATOGRAPHY (ERCP) WITH PROPOFOL;  Surgeon: Milus Banister, MD;  Location: [facility] ENDOSCOPY;  Service: Endoscopy;  Laterality: N/A;  . JOINT REPLACEMENT    . ORIF ANKLE FRACTURE Left    X 2  . REPLACEMENT TOTAL KNEE     left  . SURGERY DONE TO REMOVE LEFT ANKLE HARDWARE LATER    . TONSILLECTOMY      reports that she quit smoking about 56 years ago. Her smoking use included cigarettes. She started smoking about 69 years ago. She has a 6.50 pack-year smoking history. She has never used smokeless tobacco. She reports that she drinks about 3.0 oz of alcohol per week. She reports that she does not use drugs. Social History   Socioeconomic History  . Marital status: Widowed    Spouse name: Not on file  . Number of children: 3  . Years of education: college  . Highest education level: Bachelor's degree (e.g., BA, AB, BS)  Occupational History  . Occupation: retired Optician, dispensing: RETIRED  Social Needs  . Financial resource strain: Not on file  . Food insecurity:    Worry: Never true    Inability: Not on file  . Transportation needs:    Medical: No    Non-medical: No  Tobacco Use  . Smoking status: Former Smoker    Packs/day:  0.50    Years: 13.00    Pack years: 6.50    Types: Cigarettes    Start date: 02/12/1948    Last attempt to quit: 02/11/1961    Years since quitting: 56.5  . Smokeless tobacco: Never Used  . Tobacco comment: Denies use of tobacco since 1963  Substance and Sexual Activity  . Alcohol use: Yes    Alcohol/week: 3.0 oz    Types: 5 Glasses of wine per week    Comment: WINE FEW TIMES PER WEEK  . Drug use: No  . Sexual activity: Never  Lifestyle  . Physical activity:    Days per week: Not on file    Minutes per session: Not on file  . Stress: Not on file  Relationships  . Social connections:    Talks on phone: Not on file    Gets together: Not on file    Attends religious service: Not on  file    Active member of club or organization: Not on file    Attends meetings of clubs or organizations: Not on file    Relationship status: Not on file  . Intimate partner violence:    Fear of current or ex partner: Not on file    Emotionally abused: Not on file    Physically abused: Not on file    Forced sexual activity: Not on file  Other Topics Concern  . Not on file  Social History Narrative   Health Care POA:    Emergency Contact: son, Beaulah Romanek (c) [phone] [name])   Diet: Pt has a varied diet of protein, starch, vegetables.   Exercise: Pt has no regular exercise plan because of pain in back.   Seatbelts: Pt reports wearing seatbelt when in vehicle.   Nancy Fetter Exposure/Protection: Pt reports wearing sun screen.   Hobbies: reading, traveling, eating out.      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 [facility] also offers transportation to appts and different events  12/19/2016     Important Relationships 3 children and 5 grandchildren (ages 15-28) 12/19/2016     Pets: None  12/19/2016    Education / Work:  BSN/ Retired Therapist, sports 12/19/2016    Interests / Fun: Meals with friends, Hospice group lunch every other Wed, daily activities at [employer], going to opera  12/19/2016    Current Stressors: Worries about 3 children who travel internationally  12/19/2016    Religious / Personal Beliefs: [place], attends church at [place]  12/19/2016       L. Ducatte, RN, BSN                                                                                                     Functional Status Survey:    Family History  Problem Relation Age of Onset  . Esophageal cancer Brother   . Diverticulitis Brother   . Heart disease Mother   . Stroke Mother   . Dementia Mother   . Heart attack Mother   . Heart disease Father   . Atrial fibrillation Father   . Breast cancer Maternal Aunt   . Colon cancer Neg Hx     Health Maintenance  Topic Date Due  . INFLUENZA VACCINE  09/11/2017  . TETANUS/TDAP  04/14/2018  . DEXA SCAN  Completed  . PNA vac Low Risk Adult  Completed    Allergies  Allergen Reactions  . Iohexol Hives  . Ivp Dye [Iodinated Diagnostic Agents] Hives    Was able to tolerate Contrast Media for CT scan.  [name] [Shellfish Allergy] Nausea And Vomiting    Projectile vomiting  . Shellfish-Derived Products Nausea And Vomiting  . Sulfa Antibiotics Hives  .  Sulfamethoxazole Hives    Outpatient Encounter Medications as of 09/02/2017  Medication Sig  . acetaminophen (TYLENOL ARTHRITIS PAIN) 650 MG CR tablet Take 650 mg by mouth every 8 (eight) hours as needed for pain.   Marland Kitchen amiodarone (PACERONE) 100 MG tablet Take 1 tablet (100 mg total) by mouth daily.  Marland Kitchen atorvastatin (LIPITOR) 10 MG tablet Take 1 tablet (10 mg total) by mouth daily.  . bethanechol (URECHOLINE) 10 MG tablet Take 1 tablet (10 mg total) by mouth 3 (three) times daily.  . carvedilol (COREG) 6.25 MG  tablet Take 3 tablets (18.75 mg total) by mouth 2 (two) times daily with a meal.  . feeding supplement, ENSURE ENLIVE, (ENSURE ENLIVE) LIQD Take 237 mLs by mouth 2 (two) times daily between meals.  . hydroxypropyl methylcellulose / hypromellose (ISOPTO TEARS / GONIOVISC) 2.5 % ophthalmic solution Place 1 drop into both eyes as needed for dry eyes.  Marland Kitchen levothyroxine (SYNTHROID, LEVOTHROID) 75 MCG tablet Take 1 tablet (75 mcg total) by mouth daily.  . pantoprazole (PROTONIX) 40 MG tablet Take 1 tablet (40 mg total) by mouth daily.  . potassium chloride 20 MEQ/15ML (10%) SOLN Take 7.5 mLs (10 mEq total) by mouth daily.  Marland Kitchen senna-docusate (SENOKOT-S) 8.6-50 MG tablet Take 1 tablet by mouth 2 (two) times daily.  . Simethicone (GAS RELIEF PO) Take 1 tablet by mouth 2 (two) times daily.  Marland Kitchen tiZANidine (ZANAFLEX) 2 MG tablet Take 1 tablet (2 mg total) by mouth 4 (four) times daily -  with meals and at bedtime.  . traZODone (DESYREL) 50 MG tablet Take 25-50 mg by mouth at bedtime as needed for sleep.  . [DISCONTINUED] Amino Acids-Protein Hydrolys (FEEDING SUPPLEMENT, PRO-STAT SUGAR FREE 64,) LIQD Take 30 mLs by mouth daily at 3 pm.  . [DISCONTINUED] omeprazole (PRILOSEC OTC) 20 MG tablet Take 20 mg by mouth 2 (two) times daily.    . [DISCONTINUED] traZODone (DESYREL) 50 MG tablet Take 0.5-1 tablets (25-50 mg total) by mouth at bedtime as needed for sleep. (Patient taking differently: Take 25 mg by mouth at bedtime as needed for sleep. )   No facility-administered encounter medications on file as of 09/02/2017.     Review of Systems  Unable to perform ROS: Other (expressive and receptive aphasia)    Vitals:   09/02/17 0921  BP: 97/61  Pulse: (!) 55  Resp: 20  Temp: 98.1 F (36.7 C)  TempSrc: Oral  SpO2: 95%  Weight: 153 lb (69.4 kg)  Height: 5' (1.524 m)   Body mass index is 29.88 kg/m. Physical Exam  Constitutional: She appears well-developed and well-nourished. No distress.  HENT:  Head:  Normocephalic and atraumatic.  Right Ear: External ear normal.  Left Ear: External ear normal.  Nose: Nose normal.  Mouth/Throat: Oropharynx is clear and moist. No oropharyngeal exudate.  Eyes: Pupils are equal, round, and reactive to light. Conjunctivae and EOM are normal.  Neck: Normal range of motion. Neck supple. No JVD present.  Cardiovascular: Intact distal pulses.  irreg irreg  Pulmonary/Chest: Effort normal and breath sounds normal. No respiratory distress.  Abdominal: Soft. Bowel sounds are normal. She exhibits no distension. There is no tenderness.  Musculoskeletal:  Right side 4/5  Lymphadenopathy:    She has no cervical adenopathy.  Neurological: She is alert. A sensory deficit is present. She exhibits abnormal muscle tone.  Right hemiparesis, expressive and receptive aphasia  Skin: Skin is warm and dry. Capillary refill takes less than 2 seconds.  Psychiatric: She has a  normal mood and affect.  Smiles, attempts communication some of which is accurate, some clearly not what she intends to say    Labs reviewed: Basic Metabolic Panel: Recent Labs    08/15/17 0652 08/25/17 0802 08/27/17 0632 08/29/17 0640  NA 135 137 138 137  K 4.3 4.1 4.4 3.8  CL 102 102 107  --   CO2 25 26 23   --   GLUCOSE 102* 110* 97  --   BUN 26* 44* 51* 39*  CREATININE 1.30* 1.34* 1.29* 1.2*  CALCIUM 8.8* 9.1 8.6*  --    Liver Function Tests: Recent Labs    01/31/17 1042 08/02/17 1907 08/07/17 0547  AST 22 21 17   ALT 14 13* 12  ALKPHOS 81 74 71  BILITOT 0.8 0.6 0.7  PROT 6.7 6.4* 5.7*  ALBUMIN 3.8 3.7 2.8*   No results for input(s): LIPASE, AMYLASE in the last 8760 hours. No results for input(s): AMMONIA in the last 8760 hours. CBC: Recent Labs    01/31/17 1042 08/02/17 1907  08/07/17 0547 08/15/17 0652 08/29/17 0640  WBC 9.4 7.1  --  9.7 8.2 7.1  NEUTROABS 7.8* 4.9  --  8.0*  --   --   HGB 11.6* 11.9*   < > 11.2* 11.4* 11.6*  HCT 34.1* 36.6   < > 33.1* 35.3* 34*  MCV  93.9 95.8  --  92.5 93.4  --   PLT 250 204  --  188 301 292   < > = values in this interval not displayed.   Cardiac Enzymes: Recent Labs    08/26/17 1151  TROPONINI <0.03   BNP: Invalid input(s): POCBNP Lab Results  Component Value Date   HGBA1C 5.3 08/02/2013   Lab Results  Component Value Date   TSH 2.72 08/29/2017   Lab Results  Component Value Date   EHUDJSHF02 637 07/06/2009   Lab Results  Component Value Date   FOLATE  07/06/2009    >20.0 (NOTE)  Reference Ranges        Deficient:       0.4 - 3.3 ng/mL        Indeterminate:   3.4 - 5.4 ng/mL        Normal:              > 5.4 ng/mL   Lab Results  Component Value Date   IRON 42 09/22/2009   TIBC 281 09/22/2009   FERRITIN 49 06/26/2012    Imaging and Procedures obtained prior to SNF admission: Imaging from hospitalization and inpt rehab reviewed in hpi  Assessment/Plan 1. Thalamic hemorrhage ([place]) -with #9 and 10 -off eliquis at this time -here in rehab for PT, OT, ST -making slow progress -likely will need snf care long-term  2. Urinary retention -cont bid I/O caths -if retention b/w by bladder scan, may need to increase frequency and if voiding consistently on her own, will d/c  3. Persistent atrial fibrillation ([place]) -has been on eliquis for this and h/o DVT, PE -for f/u CT and appt with neuro and restarting of eliquis if stable and pt and son are in agreement with that approach  4. Chronic diastolic congestive heart failure (HCC) -stable w/o signs of volume overload, cont same regimen  5. Essential hypertension -bp at goal, cont current regimen  6. Stage 3 chronic kidney disease ([place]) -monitor renal function with her urinary retention and medications for bp, hydrate well, avoid nsaids  7. Gastroesophageal reflux disease without esophagitis -continues on protonix daily  which has been longstanding  8. Hypothyroidism, unspecified type -cont levothyroxine 81mg daily,  Lab Results  Component  Value Date   TSH 2.72 08/29/2017   9. Expressive aphasia -severe effect of stroke, working with ST and making some progress, receptive also noted which has made cognitive testing more challenging and communication for patient  10. Hemiparesis of right dominant side as late effect of nontraumatic intracerebral hemorrhage (HCC) -cont PT, OT, improving, using walker, but [facility] Communication: discussed with rehab nurse   Labs/tests ordered:  Neuro f/u for CT and reconsideration of eliquis PCP if discharged from rehab only, [facility]  [place] Viral Schramm, D.O. [facility] [address] [address]Cell Phone (Mon-Fri 8am-5pm):  [phone] Call:  [phone]& follow prompts after 5pm & weekends Office Phone:  [phone] Fax:  [phone]

## 2017-09-03 DIAGNOSIS — I69198 Other sequelae of nontraumatic intracerebral hemorrhage: Secondary | ICD-10-CM | POA: Diagnosis not present

## 2017-09-03 DIAGNOSIS — I69151 Hemiplegia and hemiparesis following nontraumatic intracerebral hemorrhage affecting right dominant side: Secondary | ICD-10-CM | POA: Diagnosis not present

## 2017-09-03 DIAGNOSIS — M62561 Muscle wasting and atrophy, not elsewhere classified, right lower leg: Secondary | ICD-10-CM | POA: Diagnosis not present

## 2017-09-03 DIAGNOSIS — I69314 Frontal lobe and executive function deficit following cerebral infarction: Secondary | ICD-10-CM | POA: Diagnosis not present

## 2017-09-03 DIAGNOSIS — I69320 Aphasia following cerebral infarction: Secondary | ICD-10-CM | POA: Diagnosis not present

## 2017-09-03 DIAGNOSIS — R2689 Other abnormalities of gait and mobility: Secondary | ICD-10-CM | POA: Diagnosis not present

## 2017-09-04 DIAGNOSIS — R2689 Other abnormalities of gait and mobility: Secondary | ICD-10-CM | POA: Diagnosis not present

## 2017-09-04 DIAGNOSIS — I69151 Hemiplegia and hemiparesis following nontraumatic intracerebral hemorrhage affecting right dominant side: Secondary | ICD-10-CM | POA: Diagnosis not present

## 2017-09-04 DIAGNOSIS — I69314 Frontal lobe and executive function deficit following cerebral infarction: Secondary | ICD-10-CM | POA: Diagnosis not present

## 2017-09-04 DIAGNOSIS — I69198 Other sequelae of nontraumatic intracerebral hemorrhage: Secondary | ICD-10-CM | POA: Diagnosis not present

## 2017-09-04 DIAGNOSIS — I69320 Aphasia following cerebral infarction: Secondary | ICD-10-CM | POA: Diagnosis not present

## 2017-09-04 DIAGNOSIS — M62561 Muscle wasting and atrophy, not elsewhere classified, right lower leg: Secondary | ICD-10-CM | POA: Diagnosis not present

## 2017-09-05 DIAGNOSIS — I69151 Hemiplegia and hemiparesis following nontraumatic intracerebral hemorrhage affecting right dominant side: Secondary | ICD-10-CM | POA: Diagnosis not present

## 2017-09-05 DIAGNOSIS — I69320 Aphasia following cerebral infarction: Secondary | ICD-10-CM | POA: Diagnosis not present

## 2017-09-05 DIAGNOSIS — R2689 Other abnormalities of gait and mobility: Secondary | ICD-10-CM | POA: Diagnosis not present

## 2017-09-05 DIAGNOSIS — I69198 Other sequelae of nontraumatic intracerebral hemorrhage: Secondary | ICD-10-CM | POA: Diagnosis not present

## 2017-09-05 DIAGNOSIS — I69314 Frontal lobe and executive function deficit following cerebral infarction: Secondary | ICD-10-CM | POA: Diagnosis not present

## 2017-09-05 DIAGNOSIS — M62561 Muscle wasting and atrophy, not elsewhere classified, right lower leg: Secondary | ICD-10-CM | POA: Diagnosis not present

## 2017-09-08 DIAGNOSIS — I69198 Other sequelae of nontraumatic intracerebral hemorrhage: Secondary | ICD-10-CM | POA: Diagnosis not present

## 2017-09-08 DIAGNOSIS — I69151 Hemiplegia and hemiparesis following nontraumatic intracerebral hemorrhage affecting right dominant side: Secondary | ICD-10-CM | POA: Diagnosis not present

## 2017-09-08 DIAGNOSIS — I69320 Aphasia following cerebral infarction: Secondary | ICD-10-CM | POA: Diagnosis not present

## 2017-09-08 DIAGNOSIS — R2689 Other abnormalities of gait and mobility: Secondary | ICD-10-CM | POA: Diagnosis not present

## 2017-09-08 DIAGNOSIS — I69314 Frontal lobe and executive function deficit following cerebral infarction: Secondary | ICD-10-CM | POA: Diagnosis not present

## 2017-09-08 DIAGNOSIS — M62561 Muscle wasting and atrophy, not elsewhere classified, right lower leg: Secondary | ICD-10-CM | POA: Diagnosis not present

## 2017-09-09 DIAGNOSIS — I69320 Aphasia following cerebral infarction: Secondary | ICD-10-CM | POA: Diagnosis not present

## 2017-09-09 DIAGNOSIS — I69198 Other sequelae of nontraumatic intracerebral hemorrhage: Secondary | ICD-10-CM | POA: Diagnosis not present

## 2017-09-09 DIAGNOSIS — R2689 Other abnormalities of gait and mobility: Secondary | ICD-10-CM | POA: Diagnosis not present

## 2017-09-09 DIAGNOSIS — I69151 Hemiplegia and hemiparesis following nontraumatic intracerebral hemorrhage affecting right dominant side: Secondary | ICD-10-CM | POA: Diagnosis not present

## 2017-09-09 DIAGNOSIS — M62561 Muscle wasting and atrophy, not elsewhere classified, right lower leg: Secondary | ICD-10-CM | POA: Diagnosis not present

## 2017-09-09 DIAGNOSIS — I69314 Frontal lobe and executive function deficit following cerebral infarction: Secondary | ICD-10-CM | POA: Diagnosis not present

## 2017-09-10 DIAGNOSIS — R2689 Other abnormalities of gait and mobility: Secondary | ICD-10-CM | POA: Diagnosis not present

## 2017-09-10 DIAGNOSIS — I69198 Other sequelae of nontraumatic intracerebral hemorrhage: Secondary | ICD-10-CM | POA: Diagnosis not present

## 2017-09-10 DIAGNOSIS — I69320 Aphasia following cerebral infarction: Secondary | ICD-10-CM | POA: Diagnosis not present

## 2017-09-10 DIAGNOSIS — I69314 Frontal lobe and executive function deficit following cerebral infarction: Secondary | ICD-10-CM | POA: Diagnosis not present

## 2017-09-10 DIAGNOSIS — M62561 Muscle wasting and atrophy, not elsewhere classified, right lower leg: Secondary | ICD-10-CM | POA: Diagnosis not present

## 2017-09-10 DIAGNOSIS — I69151 Hemiplegia and hemiparesis following nontraumatic intracerebral hemorrhage affecting right dominant side: Secondary | ICD-10-CM | POA: Diagnosis not present

## 2017-09-11 DIAGNOSIS — I69198 Other sequelae of nontraumatic intracerebral hemorrhage: Secondary | ICD-10-CM | POA: Diagnosis not present

## 2017-09-11 DIAGNOSIS — I69151 Hemiplegia and hemiparesis following nontraumatic intracerebral hemorrhage affecting right dominant side: Secondary | ICD-10-CM | POA: Diagnosis not present

## 2017-09-11 DIAGNOSIS — R2689 Other abnormalities of gait and mobility: Secondary | ICD-10-CM | POA: Diagnosis not present

## 2017-09-11 DIAGNOSIS — I616 Nontraumatic intracerebral hemorrhage, multiple localized: Secondary | ICD-10-CM | POA: Diagnosis not present

## 2017-09-11 DIAGNOSIS — I69028 Other speech and language deficits following nontraumatic subarachnoid hemorrhage: Secondary | ICD-10-CM | POA: Diagnosis not present

## 2017-09-11 DIAGNOSIS — I69320 Aphasia following cerebral infarction: Secondary | ICD-10-CM | POA: Diagnosis not present

## 2017-09-11 DIAGNOSIS — I69051 Hemiplegia and hemiparesis following nontraumatic subarachnoid hemorrhage affecting right dominant side: Secondary | ICD-10-CM | POA: Diagnosis not present

## 2017-09-11 DIAGNOSIS — R278 Other lack of coordination: Secondary | ICD-10-CM | POA: Diagnosis not present

## 2017-09-11 DIAGNOSIS — I608 Other nontraumatic subarachnoid hemorrhage: Secondary | ICD-10-CM | POA: Diagnosis not present

## 2017-09-11 DIAGNOSIS — S06360S Traumatic hemorrhage of cerebrum, unspecified, without loss of consciousness, sequela: Secondary | ICD-10-CM | POA: Diagnosis not present

## 2017-09-11 DIAGNOSIS — I5031 Acute diastolic (congestive) heart failure: Secondary | ICD-10-CM | POA: Diagnosis not present

## 2017-09-11 DIAGNOSIS — M62561 Muscle wasting and atrophy, not elsewhere classified, right lower leg: Secondary | ICD-10-CM | POA: Diagnosis not present

## 2017-09-11 DIAGNOSIS — I69314 Frontal lobe and executive function deficit following cerebral infarction: Secondary | ICD-10-CM | POA: Diagnosis not present

## 2017-09-12 DIAGNOSIS — M62561 Muscle wasting and atrophy, not elsewhere classified, right lower leg: Secondary | ICD-10-CM | POA: Diagnosis not present

## 2017-09-12 DIAGNOSIS — I69151 Hemiplegia and hemiparesis following nontraumatic intracerebral hemorrhage affecting right dominant side: Secondary | ICD-10-CM | POA: Diagnosis not present

## 2017-09-12 DIAGNOSIS — I69320 Aphasia following cerebral infarction: Secondary | ICD-10-CM | POA: Diagnosis not present

## 2017-09-12 DIAGNOSIS — I69314 Frontal lobe and executive function deficit following cerebral infarction: Secondary | ICD-10-CM | POA: Diagnosis not present

## 2017-09-12 DIAGNOSIS — I69198 Other sequelae of nontraumatic intracerebral hemorrhage: Secondary | ICD-10-CM | POA: Diagnosis not present

## 2017-09-12 DIAGNOSIS — R2689 Other abnormalities of gait and mobility: Secondary | ICD-10-CM | POA: Diagnosis not present

## 2017-09-15 DIAGNOSIS — I69198 Other sequelae of nontraumatic intracerebral hemorrhage: Secondary | ICD-10-CM | POA: Diagnosis not present

## 2017-09-15 DIAGNOSIS — I69320 Aphasia following cerebral infarction: Secondary | ICD-10-CM | POA: Diagnosis not present

## 2017-09-15 DIAGNOSIS — I69314 Frontal lobe and executive function deficit following cerebral infarction: Secondary | ICD-10-CM | POA: Diagnosis not present

## 2017-09-15 DIAGNOSIS — I69151 Hemiplegia and hemiparesis following nontraumatic intracerebral hemorrhage affecting right dominant side: Secondary | ICD-10-CM | POA: Diagnosis not present

## 2017-09-15 DIAGNOSIS — M62561 Muscle wasting and atrophy, not elsewhere classified, right lower leg: Secondary | ICD-10-CM | POA: Diagnosis not present

## 2017-09-15 DIAGNOSIS — R2689 Other abnormalities of gait and mobility: Secondary | ICD-10-CM | POA: Diagnosis not present

## 2017-09-16 DIAGNOSIS — M62561 Muscle wasting and atrophy, not elsewhere classified, right lower leg: Secondary | ICD-10-CM | POA: Diagnosis not present

## 2017-09-16 DIAGNOSIS — I69198 Other sequelae of nontraumatic intracerebral hemorrhage: Secondary | ICD-10-CM | POA: Diagnosis not present

## 2017-09-16 DIAGNOSIS — I69314 Frontal lobe and executive function deficit following cerebral infarction: Secondary | ICD-10-CM | POA: Diagnosis not present

## 2017-09-16 DIAGNOSIS — I69320 Aphasia following cerebral infarction: Secondary | ICD-10-CM | POA: Diagnosis not present

## 2017-09-16 DIAGNOSIS — I69151 Hemiplegia and hemiparesis following nontraumatic intracerebral hemorrhage affecting right dominant side: Secondary | ICD-10-CM | POA: Diagnosis not present

## 2017-09-16 DIAGNOSIS — R2689 Other abnormalities of gait and mobility: Secondary | ICD-10-CM | POA: Diagnosis not present

## 2017-09-17 DIAGNOSIS — M62561 Muscle wasting and atrophy, not elsewhere classified, right lower leg: Secondary | ICD-10-CM | POA: Diagnosis not present

## 2017-09-17 DIAGNOSIS — R2689 Other abnormalities of gait and mobility: Secondary | ICD-10-CM | POA: Diagnosis not present

## 2017-09-17 DIAGNOSIS — I69314 Frontal lobe and executive function deficit following cerebral infarction: Secondary | ICD-10-CM | POA: Diagnosis not present

## 2017-09-17 DIAGNOSIS — I69320 Aphasia following cerebral infarction: Secondary | ICD-10-CM | POA: Diagnosis not present

## 2017-09-17 DIAGNOSIS — I69198 Other sequelae of nontraumatic intracerebral hemorrhage: Secondary | ICD-10-CM | POA: Diagnosis not present

## 2017-09-17 DIAGNOSIS — I69151 Hemiplegia and hemiparesis following nontraumatic intracerebral hemorrhage affecting right dominant side: Secondary | ICD-10-CM | POA: Diagnosis not present

## 2017-09-18 DIAGNOSIS — I69314 Frontal lobe and executive function deficit following cerebral infarction: Secondary | ICD-10-CM | POA: Diagnosis not present

## 2017-09-18 DIAGNOSIS — I69320 Aphasia following cerebral infarction: Secondary | ICD-10-CM | POA: Diagnosis not present

## 2017-09-18 DIAGNOSIS — I69151 Hemiplegia and hemiparesis following nontraumatic intracerebral hemorrhage affecting right dominant side: Secondary | ICD-10-CM | POA: Diagnosis not present

## 2017-09-18 DIAGNOSIS — R2689 Other abnormalities of gait and mobility: Secondary | ICD-10-CM | POA: Diagnosis not present

## 2017-09-18 DIAGNOSIS — M62561 Muscle wasting and atrophy, not elsewhere classified, right lower leg: Secondary | ICD-10-CM | POA: Diagnosis not present

## 2017-09-18 DIAGNOSIS — I69198 Other sequelae of nontraumatic intracerebral hemorrhage: Secondary | ICD-10-CM | POA: Diagnosis not present

## 2017-09-19 DIAGNOSIS — R2689 Other abnormalities of gait and mobility: Secondary | ICD-10-CM | POA: Diagnosis not present

## 2017-09-19 DIAGNOSIS — I69198 Other sequelae of nontraumatic intracerebral hemorrhage: Secondary | ICD-10-CM | POA: Diagnosis not present

## 2017-09-19 DIAGNOSIS — I69314 Frontal lobe and executive function deficit following cerebral infarction: Secondary | ICD-10-CM | POA: Diagnosis not present

## 2017-09-19 DIAGNOSIS — M62561 Muscle wasting and atrophy, not elsewhere classified, right lower leg: Secondary | ICD-10-CM | POA: Diagnosis not present

## 2017-09-19 DIAGNOSIS — I69320 Aphasia following cerebral infarction: Secondary | ICD-10-CM | POA: Diagnosis not present

## 2017-09-19 DIAGNOSIS — I69151 Hemiplegia and hemiparesis following nontraumatic intracerebral hemorrhage affecting right dominant side: Secondary | ICD-10-CM | POA: Diagnosis not present

## 2017-09-21 DIAGNOSIS — I69198 Other sequelae of nontraumatic intracerebral hemorrhage: Secondary | ICD-10-CM | POA: Diagnosis not present

## 2017-09-21 DIAGNOSIS — I69151 Hemiplegia and hemiparesis following nontraumatic intracerebral hemorrhage affecting right dominant side: Secondary | ICD-10-CM | POA: Diagnosis not present

## 2017-09-21 DIAGNOSIS — I69320 Aphasia following cerebral infarction: Secondary | ICD-10-CM | POA: Diagnosis not present

## 2017-09-21 DIAGNOSIS — I69314 Frontal lobe and executive function deficit following cerebral infarction: Secondary | ICD-10-CM | POA: Diagnosis not present

## 2017-09-21 DIAGNOSIS — R2689 Other abnormalities of gait and mobility: Secondary | ICD-10-CM | POA: Diagnosis not present

## 2017-09-21 DIAGNOSIS — M62561 Muscle wasting and atrophy, not elsewhere classified, right lower leg: Secondary | ICD-10-CM | POA: Diagnosis not present

## 2017-09-22 ENCOUNTER — Encounter: Payer: Self-pay | Admitting: Adult Health

## 2017-09-22 ENCOUNTER — Non-Acute Institutional Stay (SKILLED_NURSING_FACILITY): Payer: Medicare Other | Admitting: Adult Health

## 2017-09-22 DIAGNOSIS — I69198 Other sequelae of nontraumatic intracerebral hemorrhage: Secondary | ICD-10-CM | POA: Diagnosis not present

## 2017-09-22 DIAGNOSIS — I481 Persistent atrial fibrillation: Secondary | ICD-10-CM | POA: Diagnosis not present

## 2017-09-22 DIAGNOSIS — I5032 Chronic diastolic (congestive) heart failure: Secondary | ICD-10-CM | POA: Diagnosis not present

## 2017-09-22 DIAGNOSIS — R4701 Aphasia: Secondary | ICD-10-CM | POA: Diagnosis not present

## 2017-09-22 DIAGNOSIS — I4819 Other persistent atrial fibrillation: Secondary | ICD-10-CM

## 2017-09-22 DIAGNOSIS — F01518 Vascular dementia, unspecified severity, with other behavioral disturbance: Secondary | ICD-10-CM

## 2017-09-22 DIAGNOSIS — M62561 Muscle wasting and atrophy, not elsewhere classified, right lower leg: Secondary | ICD-10-CM | POA: Diagnosis not present

## 2017-09-22 DIAGNOSIS — I10 Essential (primary) hypertension: Secondary | ICD-10-CM | POA: Diagnosis not present

## 2017-09-22 DIAGNOSIS — I610 Nontraumatic intracerebral hemorrhage in hemisphere, subcortical: Secondary | ICD-10-CM | POA: Diagnosis not present

## 2017-09-22 DIAGNOSIS — I69314 Frontal lobe and executive function deficit following cerebral infarction: Secondary | ICD-10-CM | POA: Diagnosis not present

## 2017-09-22 DIAGNOSIS — F0151 Vascular dementia with behavioral disturbance: Secondary | ICD-10-CM | POA: Diagnosis not present

## 2017-09-22 DIAGNOSIS — I69320 Aphasia following cerebral infarction: Secondary | ICD-10-CM | POA: Diagnosis not present

## 2017-09-22 DIAGNOSIS — R2689 Other abnormalities of gait and mobility: Secondary | ICD-10-CM | POA: Diagnosis not present

## 2017-09-22 DIAGNOSIS — I69151 Hemiplegia and hemiparesis following nontraumatic intracerebral hemorrhage affecting right dominant side: Secondary | ICD-10-CM | POA: Diagnosis not present

## 2017-09-22 DIAGNOSIS — R339 Retention of urine, unspecified: Secondary | ICD-10-CM

## 2017-09-22 NOTE — Progress Notes (Signed)
Location:  Occupational psychologist of Service:  SNF (31) Provider:   Cindi Carbon, ANP [place] [phone]   Hensel, Jamal Collin, MD  Patient Care Team: Zenia Resides, MD as PCP - General Benard Rink (Dentistry) [place], Heather, [place] (Optometry) Sydnee Levans, MD (Dermatology) Thompson Grayer, MD as Consulting Physician (Cardiology) [name], DO as Consulting Physician (Geriatric Medicine)  Extended Emergency Contact Information Primary Emergency Contact: [name] [place] Phone: [phone] Mobile Phone: [phone] Relation: Son Secondary Emergency Contact: [name] [place] Mobile Phone: [phone] Relation: Son  Code Status:  DNR Goals of care: Advanced Directive information Advanced Directives 09/02/2017  Does Patient Have a Medical Advance Directive? Yes  Type of Paramedic of [place];Living will;Out of facility DNR (pink MOST or yellow form)  Does patient want to make changes to medical advance directive? No - Patient declined  Copy of [place] in Chart? Yes  Would patient like information on creating a medical advance directive? -  Pre-existing out of facility DNR order (yellow form or pink MOST form) Yellow form placed in chart (order not valid for inpatient use);Pink MOST form placed in chart (order not valid for inpatient use)     Chief Complaint  Patient presents with  . Medical Management of Chronic Issues    HPI:  Pt is a 84 y.o. female seen today for medical management of chronic diseases.   She was admitted to the hospital 07/13/17-08/06/17 and follow up rehab stay at the hospital 08/06/17-08/27/17 due to a thalamic hemorrhage due to uncontrolled htn, and eliquis. She has a hx of HLD, HTN, multiple pulmonary nodules, anemia, hyponatremia, afib, CHF, PE, and CKD. She previously lived in independent living but was sent to  the ER on 6/22 due to right facial droop, right sided weakness, and aphasia. CT showed an acute left thalamic/internal capsule/basal ganglia intraparenchymal hemorrhage.  As she was on Eliquis, it was reversed with Andexxa.  MRI showed a similar size of acute left thalamic intraparenchymal hemorrhage, measuring 1.9 x 2.0 x 2.5 cm. Mildly increased localized edema with trace left-to-right midline shift.. Two subcentimeter subacute ischemic cortical infarcts involving the left frontal and parietooccipital regions. Update: she has been in rehab at [facility]. She has made gains in her coordination and balance, now able to walk with a walker. However she remains with cognitive impairment and expressive aphasia. She is easily able to follow commands and the staff feels she will be able to transfer to enhanced AL as soon as a room is available. The past few days she has shown some signs of irritability towards the staff. When asked about her mood using a smiley face chart she chose a smiling face.  She has a hx of urinary retention but has been voiding per the staff and no longer needed catheterization. PVR on bladder scan 214 this am.  Functional status: ambulatory with a walker, intermittently incontinent of urin Past Medical History:  Diagnosis Date  . Abdominal mass   . AKI (acute kidney injury) ([place]) 09/15/2014  . Atrial fibrillation ([place])   . Colitis, ulcerative ([place])   . Cough    WHITE SPUTUM FOR 2 WEEKS, NO FEVER  . Degenerative joint disease   . Diverticulosis   . DVT (deep venous thrombosis) ([place]) 2009   BOTH LUNGS  . GERD (gastroesophageal reflux disease)   . Hyperlipidemia   . Hyperplastic colon polyp   . Hypertension   .  Hypothyroidism   . Iron deficiency anemia    ON IRON AT TIMES  . PE (pulmonary embolism) 2009  . Persistent atrial fibrillation ([place])   . Positive H. pylori test 01/20/96  . S/P dilatation of esophageal stricture 1992  . Skin cancer    h/o basal and squamous skin  cancer--> removed   Past Surgical History:  Procedure Laterality Date  . APPENDECTOMY    . BREAST EXCISIONAL BIOPSY Left 1992   benign  . BREAST LUMPECTOMY     benign  . DILATION AND CURETTAGE OF UTERUS     for endometrial polyps  . ENDOSCOPIC RETROGRADE CHOLANGIOPANCREATOGRAPHY (ERCP) WITH PROPOFOL N/A 03/23/2015   Procedure: ENDOSCOPIC RETROGRADE CHOLANGIOPANCREATOGRAPHY (ERCP) WITH PROPOFOL;  Surgeon: Milus Banister, MD;  Location: [facility] END[facility]COPY;  Service: Endoscopy;  Laterality: N/A;  . JOINT REPLACEMENT    . ORIF ANKLE FRACTURE Left    X 2  . REPLACEMENT TOTAL KNEE     left  . SURGERY DONE TO REMOVE LEFT ANKLE HARDWARE LATER    . TONSILLECTOMY      Allergies  Allergen Reactions  . Iohexol Hives  . Ivp Dye [Iodinated Diagnostic Agents] Hives    Was able to tolerate Contrast Media for CT scan.  [name] [Shellfish Allergy] Nausea And Vomiting    Projectile vomiting  . Shellfish-Derived Products Nausea And Vomiting  . Sulfa Antibiotics Hives  . Sulfamethoxazole Hives    Outpatient Encounter Medications as of 09/22/2017  Medication Sig  . amiodarone (PACERONE) 100 MG tablet Take 1 tablet (100 mg total) by mouth daily.  Marland Kitchen atorvastatin (LIPITOR) 10 MG tablet Take 1 tablet (10 mg total) by mouth daily.  . bethanechol (URECHOLINE) 10 MG tablet Take 1 tablet (10 mg total) by mouth 3 (three) times daily.  . carvedilol (COREG) 6.25 MG tablet Take 3 tablets (18.75 mg total) by mouth 2 (two) times daily with a meal.  . feeding supplement, ENSURE ENLIVE, (ENSURE ENLIVE) LIQD Take 237 mLs by mouth 2 (two) times daily between meals.  . hydroxypropyl methylcellulose / hypromellose (ISOPTO TEARS / GONIOVISC) 2.5 % ophthalmic solution Place 1 drop into both eyes as needed for dry eyes.  Marland Kitchen levothyroxine (SYNTHROID, LEVOTHROID) 75 MCG tablet Take 1 tablet (75 mcg total) by mouth daily.  . pantoprazole (PROTONIX) 40 MG tablet Take 1 tablet (40 mg total) by mouth daily.  . potassium chloride  20 MEQ/15ML (10%) SOLN Take 7.5 mLs (10 mEq total) by mouth daily.  Marland Kitchen senna-docusate (SENOKOT-S) 8.6-50 MG tablet Take 1 tablet by mouth 2 (two) times daily.  . Simethicone (GAS RELIEF PO) Take 1 tablet by mouth 2 (two) times daily.  Marland Kitchen tiZANidine (ZANAFLEX) 2 MG tablet Take 1 tablet (2 mg total) by mouth 4 (four) times daily -  with meals and at bedtime.  Marland Kitchen acetaminophen (TYLENOL ARTHRITIS PAIN) 650 MG CR tablet Take 650 mg by mouth every 8 (eight) hours as needed for pain.   . [DISCONTINUED] omeprazole (PRILOSEC OTC) 20 MG tablet Take 20 mg by mouth 2 (two) times daily.    . [DISCONTINUED] traZODone (DESYREL) 50 MG tablet Take 25-50 mg by mouth at bedtime as needed for sleep.   No facility-administered encounter medications on file as of 09/22/2017.     Review of Systems  Unable to perform ROS: Dementia    Immunization History  Administered Date(s) Administered  . H1N1 03/04/2008  . Influenza Split 11/12/2010, 11/26/2011  . Influenza Whole 11/26/2006, 11/03/2007, 12/10/2007, 11/16/2008, 11/20/2009  . Influenza,inj,Quad PF,6+  Mos 11/24/2012, 11/29/2013, 10/14/2014, 11/28/2015, 12/04/2016  . PPD Test 10/03/2014  . Pneumococcal Conjugate-13 09/10/2013  . Pneumococcal Polysaccharide-23 02/12/2000  . Td 02/11/1998, 04/13/2008  . Zoster 05/08/2006   Pertinent  Health Maintenance Due  Topic Date Due  . INFLUENZA VACCINE  09/11/2017  . DEXA SCAN  Completed  . PNA vac Low Risk Adult  Completed   Fall Risk  02/12/2017 01/27/2017 12/19/2016 08/01/2016 06/27/2016  Falls in the past year? No No No No No  Number falls in past yr: - - - - -  Comment - - - - -   Functional Status Survey:    Vitals:   09/22/17 1025  BP: (!) 143/83  Pulse: 93  Weight: 149 lb 9.6 oz (67.9 kg)   Body mass index is 29.22 kg/m.  Wt Readings from Last 3 Encounters:  09/22/17 149 lb 9.6 oz (67.9 kg)  09/02/17 153 lb (69.4 kg)  08/27/17 152 lb 8.9 oz (69.2 kg)    Physical Exam  Constitutional: No distress.    HENT:  Head: Normocephalic and atraumatic.  Neck: No JVD present.  Cardiovascular: Normal rate.  No murmur heard. irreg  Pulmonary/Chest: Effort normal and breath sounds normal. No respiratory distress. She has no wheezes.  Abdominal: Soft. Bowel sounds are normal. She exhibits no distension. There is no tenderness.  Musculoskeletal: She exhibits no edema or tenderness.  Right upper arm weakness 4/5  Neurological: She is alert.  Improved coordination on finger to nose test.  Inappropriate word using and finding unchanged  Skin: Skin is warm and dry. She is not diaphoretic.  Psychiatric: She has a normal mood and affect.  Nursing note and vitals reviewed.   Labs reviewed: Recent Labs    08/15/17 0652 08/25/17 0802 08/27/17 0632 08/29/17 0640  NA 135 137 138 137  K 4.3 4.1 4.4 3.8  CL 102 102 107  --   CO2 25 26 23   --   GLUCOSE 102* 110* 97  --   BUN 26* 44* 51* 39*  CREATININE 1.30* 1.34* 1.29* 1.2*  CALCIUM 8.8* 9.1 8.6*  --    Recent Labs    01/31/17 1042 08/02/17 1907 08/07/17 0547  AST 22 21 17   ALT 14 13* 12  ALKPHOS 81 74 71  BILITOT 0.8 0.6 0.7  PROT 6.7 6.4* 5.7*  ALBUMIN 3.8 3.7 2.8*   Recent Labs    01/31/17 1042 08/02/17 1907  08/07/17 0547 08/15/17 0652 08/29/17 0640  WBC 9.4 7.1  --  9.7 8.2 7.1  NEUTROABS 7.8* 4.9  --  8.0*  --   --   HGB 11.6* 11.9*   < > 11.2* 11.4* 11.6*  HCT 34.1* 36.6   < > 33.1* 35.3* 34*  MCV 93.9 95.8  --  92.5 93.4  --   PLT 250 204  --  188 301 292   < > = values in this interval not displayed.   Lab Results  Component Value Date   TSH 2.72 08/29/2017   Lab Results  Component Value Date   HGBA1C 5.3 08/02/2013   Lab Results  Component Value Date   CHOL 89 09/16/2014   HDL 13 (L) 09/16/2014   LDLCALC 40 09/16/2014   LDLDIRECT 85 08/18/2008   TRIG 76 08/05/2017   CHOLHDL 6.8 09/16/2014    Significant Diagnostic Results in last 30 days:  No results found.  Assessment/Plan  1. Thalamic  hemorrhage ([place]) She has made small improvements in coordination and balance but ultimately will  need enhanced assisted living due to her need for cuing, set up for baths, med management, and incontinence/urinary retention.   2. Expressive aphasia No improvement during my exam after working with speech. The staff has some charts and communication tools that will hopefully help her when speaking with staff/family.  3. Persistent atrial fibrillation ([place]) Off eliquis due to hemorrhage.  Rate controlled with amiodarone and coreg.  Lab Results  Component Value Date   TSH 2.72 08/29/2017    4. Chronic diastolic congestive heart failure ([place]) Compensated  5. Essential hypertension Controlled with current regimen  6. Urinary retention Remains on urecholine. The staff will need to continue to monitor for bladder distention and associated agitation.  Bladder scan if needed. I will discuss the continue need for urecholine with Dr. Mariea Clonts  7. Vascular dementia with behavior disturbance Not able to complete test for cognitive scoring but clearly has some deficits. She remains able to follow commands and assist with ADLs. Due to see Neurology on 8/14  Family/ staff Communication: staff/resident  Labs/tests ordered:  NA

## 2017-09-23 DIAGNOSIS — I69320 Aphasia following cerebral infarction: Secondary | ICD-10-CM | POA: Diagnosis not present

## 2017-09-23 DIAGNOSIS — I69198 Other sequelae of nontraumatic intracerebral hemorrhage: Secondary | ICD-10-CM | POA: Diagnosis not present

## 2017-09-23 DIAGNOSIS — I69151 Hemiplegia and hemiparesis following nontraumatic intracerebral hemorrhage affecting right dominant side: Secondary | ICD-10-CM | POA: Diagnosis not present

## 2017-09-23 DIAGNOSIS — R2689 Other abnormalities of gait and mobility: Secondary | ICD-10-CM | POA: Diagnosis not present

## 2017-09-23 DIAGNOSIS — I69314 Frontal lobe and executive function deficit following cerebral infarction: Secondary | ICD-10-CM | POA: Diagnosis not present

## 2017-09-23 DIAGNOSIS — M62561 Muscle wasting and atrophy, not elsewhere classified, right lower leg: Secondary | ICD-10-CM | POA: Diagnosis not present

## 2017-09-23 NOTE — Progress Notes (Signed)
[facility] [address] [place]. [place] [address] [phone]       OFFICE FOLLOW UP NOTE  [name] Date of Birth:  Oct 25, 1930 Medical Record Number:  [id number]   Reason for Referral:  hospital ICH follow up  CHIEF COMPLAINT:  Chief Complaint  Patient presents with  . Follow-up    PT is with Sara Russell and Sara Russell her son pt at [facility] pt was in [place] for stroke    HPI: Sara Russell is being seen today for initial visit in the office for Sara Russell related to uncontrolled HTN and Eliquis on 08/02/2017. History obtained from patient, sons and chart review. Reviewed all radiology images and labs personally.  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  She sat down for dinner and then was noted to be "having a stroke" by companions at the table, with the above symptoms. BP prior to arrival was221/89. The patient was currently on Eliquis for a-fib. Other stroke risk factors are HLD and HTN. She has a history of DVT and PE as well.  CT head reviewed and showed an acute left thalamic/internal capsule/basal ganglia intraparenchymal hemorrhage.  As she was on Eliquis, it was reversed with Andexxa. She was admitted to the neuro ICU for further evaluation and treatment.  MRI head reviewed and showed similar size of acute left thalamic intraparenchymal hemorrhage, measuring 1.9 x 2.0 x 2.5 cm along with mildly increased localized edema with trace left to right midline shift and tubes subcentimeter subacute ischemic cortical infarcts involving the left frontal and peri-occipital regions.  Repeat CT head stable.  Carotid Doppler showed bilateral ICA stenosis of 1 to 39% and VAs antegrade.  2D echo showed an EF of 60 to 65% without  cardiac source of embolus.  LDL not performed the patient was on Lipitor 10 mg daily prior and this was recommended to continue.  HTN was treated with Cleviprex and was stable throughout the rest of admission with long-term BP goal normotensive range.  A1c satisfactory at 5.3.  Eliquis stopped due to [place] and recommended aspirin 81 mg at discharge and after 1 months recommended to repeat CT scan and if resolved, recommended to consider resuming Eliquis.  Patient was discharged to [facility] for continued therapies and then discharged on 08/27/2017 from CIR back to [facility].  Patient is being seen today for hospital follow-up and is accompanied by her 2 sons.  She continues to have expressive aphasia and right hemiparesis but this has been improving.  She currently is receiving PT/OT/ST at [facility]. She currently is sitting in wheelchair due to transportation but typically uses rolling walker for ambulation and denies any recent falls. Patient was not discharged on aspirin as it was recommended during hospitalization nor did she receive repeat CT scan.  She continues to take Lipitor without side effects of myalgias.  Sounds have also been concerned regarding worsening of memory since her stroke and denies any previous memory concerns.  Short-term memory loss is her main concern but at times can have difficulty with long-term memory such as names of her family members.  They have seen some improvement with her memory but it can wax and wane.  Blood pressure today satisfactory 124/73 and from what sons are aware  of, blood pressure has been stable at facility.  Denies new or worsening stroke/TIA symptoms.   ROS:   14 system review of systems performed and negative with exception of weakness, speech difficulty and memory loss  PMH:  Past Medical History:  Diagnosis Date  . Abdominal mass   . AKI (acute kidney injury) ([place]) 09/15/2014  . Atrial fibrillation ([place])   . Colitis, ulcerative ([place])     . Cough    WHITE SPUTUM FOR 2 WEEKS, NO FEVER  . Degenerative joint disease   . Diverticulosis   . DVT (deep venous thrombosis) ([place]) 2009   BOTH LUNGS  . GERD (gastroesophageal reflux disease)   . Hyperlipidemia   . Hyperplastic colon polyp   . Hypertension   . Hypothyroidism   . Iron deficiency anemia    ON IRON AT TIMES  . PE (pulmonary embolism) 2009  . Persistent atrial fibrillation ([place])   . Positive H. pylori test 01/20/96  . S/P dilatation of esophageal stricture 1992  . Skin cancer    h/o basal and squamous skin cancer--> removed  . Stroke [facility])     PSH:  Past Surgical History:  Procedure Laterality Date  . APPENDECTOMY    . BREAST EXCISIONAL BIOPSY Left 1992   benign  . BREAST LUMPECTOMY     benign  . DILATION AND CURETTAGE OF UTERUS     for endometrial polyps  . ENDOSCOPIC RETROGRADE CHOLANGIOPANCREATOGRAPHY (ERCP) WITH PROPOFOL N/A 03/23/2015   Procedure: ENDOSCOPIC RETROGRADE CHOLANGIOPANCREATOGRAPHY (ERCP) WITH PROPOFOL;  Surgeon: Milus Banister, MD;  Location: [facility] ENDOSCOPY;  Service: Endoscopy;  Laterality: N/A;  . JOINT REPLACEMENT    . ORIF ANKLE FRACTURE Left    X 2  . REPLACEMENT TOTAL KNEE     left  . SURGERY DONE TO REMOVE LEFT ANKLE HARDWARE LATER    . TONSILLECTOMY      Social History:  Social History   Socioeconomic History  . Marital status: Widowed    Spouse name: Not on file  . Number of children: 3  . Years of education: college  . Highest education level: Bachelor's degree (e.g., BA, AB, BS)  Occupational History  . Occupation: retired Optician, dispensing: RETIRED  Social Needs  . Financial resource strain: Not on file  . Food insecurity:    Worry: Never true    Inability: Not on file  . Transportation needs:    Medical: No    Non-medical: No  Tobacco Use  . Smoking status: Former Smoker    Packs/day: 0.50    Years: 13.00    Pack years: 6.50    Types: Cigarettes    Start date: 02/12/1948    Last attempt to quit: 02/11/1961     Years since quitting: 56.6  . Smokeless tobacco: Never Used  . Tobacco comment: Denies use of tobacco since 1963  Substance and Sexual Activity  . Alcohol use: Yes    Alcohol/week: 5.0 standard drinks    Types: 5 Glasses of wine per week    Comment: WINE FEW TIMES PER WEEK  . Drug use: No  . Sexual activity: Never  Lifestyle  . Physical activity:    Days per week: Not on file    Minutes per session: Not on file  . Stress: Not on file  Relationships  . Social connections:    Talks on phone: Not on file    Gets together: Not on file    Attends religious service: Not on file  Active member of club or organization: Not on file    Attends meetings of clubs or organizations: Not on file    Relationship status: Not on file  . Intimate partner violence:    Fear of current or ex partner: Not on file    Emotionally abused: Not on file    Physically abused: Not on file    Forced sexual activity: Not on file  Other Topics Concern  . Not on file  Social History Narrative   Health Care POA:    Emergency Contact: son, Devorah Givhan (c) [phone] Sara Russell)   Diet: Pt has a varied diet of protein, starch, vegetables.   Exercise: Pt has no regular exercise plan because of pain in back.   Seatbelts: Pt reports wearing seatbelt when in vehicle.   Nancy Fetter Exposure/Protection: Pt reports wearing sun screen.   Hobbies: reading, traveling, eating out.      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 Ducatte, RN, BSN                                                                                                     Family History:  Family History  Problem Relation Age of Onset  . Esophageal cancer Brother   . Diverticulitis Brother   . Heart disease Mother   . Stroke Mother   . Dementia Mother   . Heart attack Mother   . Heart disease Father   . Atrial fibrillation Father   . Breast cancer Maternal Aunt   . Colon cancer Neg Hx     Medications:   Current Outpatient Medications on File Prior to Visit  Medication Sig Dispense Refill  . acetaminophen (TYLENOL ARTHRITIS PAIN) 650 MG CR tablet Take 650 mg by mouth every 8 (eight) hours as needed for pain.     Marland Kitchen amiodarone (PACERONE) 100 MG tablet Take 1 tablet (100 mg total) by mouth daily. 90 tablet 3  . atorvastatin (LIPITOR) 10 MG tablet Take 1 tablet (10 mg total) by mouth daily. 90 tablet 3  . bethanechol (URECHOLINE) 10 MG tablet Take 1 tablet (10 mg total) by mouth 3 (three)  times daily.    . carvedilol (COREG) 6.25 MG tablet Take 3 tablets (18.75 mg total) by mouth 2 (two) times daily with a meal.    . feeding supplement, ENSURE ENLIVE, (ENSURE ENLIVE) LIQD Take 237 mLs by mouth 2 (two) times daily between meals. 237 mL 12  . hydroxypropyl methylcellulose / hypromellose (ISOPTO TEARS / GONIOVISC) 2.5 % ophthalmic solution Place 1 drop into both eyes as needed for dry eyes. 15 mL 12  . levothyroxine (SYNTHROID, LEVOTHROID) 75 MCG tablet Take 1 tablet (75 mcg total) by mouth daily. 90 tablet 3  . pantoprazole (PROTONIX) 40 MG tablet Take 1 tablet (40 mg total) by mouth  daily.    . potassium chloride 20 MEQ/15ML (10%) SOLN Take 7.5 mLs (10 mEq total) by mouth daily. 450 mL 0  . senna-docusate (SENOKOT-S) 8.6-50 MG tablet Take 1 tablet by mouth 2 (two) times daily.    . Simethicone (GAS RELIEF PO) Take 1 tablet by mouth 2 (two) times daily.    Marland Kitchen tiZANidine (ZANAFLEX) 2 MG tablet Take 1 tablet (2 mg total) by mouth 4 (four) times daily -  with meals and at bedtime. 30 tablet 0  . [DISCONTINUED] omeprazole (PRILOSEC OTC) 20 MG tablet Take 20 mg by mouth 2 (two) times daily.       No current facility-administered medications on file prior to visit.     Allergies:   Allergies  Allergen Reactions  . Iohexol Hives  . Ivp Dye [Iodinated Diagnostic Agents] Hives    Was able to tolerate Contrast Media for CT scan.  [name] [Shellfish Allergy] Nausea And Vomiting    Projectile vomiting  . Shellfish-Derived Products Nausea And Vomiting  . Sulfa Antibiotics Hives  . Sulfamethoxazole Hives     Physical Exam  Vitals:   09/24/17 0931  BP: 124/73  Pulse: 64  Weight: 145 lb 6.4 oz (66 kg)   Body mass index is 28.4 kg/m. No exam data present  General: well developed, well nourished, pleasant elderly Caucasian female, seated, in no evident distress Head: head normocephalic and atraumatic.   Neck: supple with no carotid or supraclavicular bruits Cardiovascular: regular rate and rhythm, no murmurs Musculoskeletal: no deformity Skin:  no rash/petichiae Vascular:  Normal pulses all extremities  Neurologic Exam Mental Status: Awake and fully alert.  Mild to moderate expressive aphasia.  Oriented to place and time. Recent and remote memory intact. Attention span, concentration and fund of knowledge appropriate. Mood and affect appropriate.  Cranial Nerves: Fundoscopic exam reveals sharp disc margins. Pupils equal, briskly reactive to light. Extraocular movements full without nystagmus. Visual fields full to confrontation. Hearing intact. Facial sensation  intact. Face, tongue, palate moves normally and symmetrically.  Motor: Normal bulk and tone. Normal strength in all tested extremity muscles except for very minimal right-sided weakness. Sensory.: intact to touch , pinprick , position and vibratory sensation.  Coordination: Rapid alternating movements normal in all extremities. Finger-to-nose and heel-to-shin performed accurately bilaterally.  Decreased right hand dexterity. Gait and Station: Arises from chair without difficulty. Stance is normal. Gait demonstrates normal stride length and balance with assistance of rolling walker. Reflexes: 1+ and symmetric. Toes downgoing.    NIHSS  1 Modified Rankin  2 HAS-BLED 3 CHA2DS2-VASc 7   Diagnostic Data (Labs, Imaging, Testing)  Ct Head Code Stroke Wo Contrast 08/02/2017  IMPRESSION:  1. Acute hemorrhage centered within left thalamus/posterior limb of internal capsule measuring up to 1.9 cm, 2.5 cc. Mild local mass effect.  2. Stable chronic  microvascular ischemic changes and parenchymal volume loss of the brain.  3. ASPECTS is not applicable.   Mr Brain Wo Contrast 08/03/2017 IMPRESSION:  1. Similar size of acute left thalamic intraparenchymal hemorrhage, measuring 1.9 x 2.0 x 2.5 cm. Mildly increased localized edema with trace left-to-right midline shift. No intraventricular extension, hydrocephalus, or other complication identified. No underlying mass or other abnormality.  2. Two subcentimeter subacute ischemic cortical infarcts involving the left frontal and parietooccipital regionsas above.  3.Additional scattered chronic micro hemorrhages involving the cerebral and cerebellar hemispheres, favored to be secondary to chronic uncontrolled hypertension. 4. Atrophy with chronic microvascular ischemic disease.   CT Head Wo Contrast  08/03/2017 2043 1. Stable acute hemorrhage centered within left thalamus. Mild increase in edema and mass effect from 08/02/2017 with 4 mm left-to-right  midline shift, previously 3 mm. No herniation. 2. No new acute intracranial abnormality. 3. Stable chronic microvascular ischemic changes and parenchymal volume loss of the brain.   Dg Pelvis Portable 08/03/2017 IMPRESSION:  No acute osseous abnormality.   Dg Femur [place], [place] 2 Views Right 08/03/2017 IMPRESSION:  1. No acute osseous abnormality.  2. Mild irregularity of the right inferior pubic ramus is questionable for fracture.   Transthoracic Echocardiogram - Left ventricle: The cavity size was normal. Wall thickness wasnormal. Systolic function was normal. the estimated ejection fraction was in the range of 60% to 65%. Wall motion was normal;there were no regional wall motion abnormalities. Features are consistent with a pseudonormal left ventricular filling pattern,with concomitant abnormal relaxation and increased fillingpressure (grade 2 diastolic dysfunction). - Mitral valve: Calcified annulus. - Tricuspid valve: There was mild-moderate regurgitation directed centrally. - Pulmonary arteries: Systolic pressure was mildly increased. PA peak pressure: 41 mm Hg (S).  Bilateral Carotid Dopplers  No evidence of stenosis in bilateral carotid arteries     ASSESSMENT: Sara Russell is a 84 y.o. year old female here with hemorrhagic stroke on 08/02/17 secondary to uncontrolled HTN and eliquis. Vascular risk factors include HLD, HTN, AF and DVT history.  Patient is being seen today for hospital follow-up and overall continues to improve but does have continued mild to moderate expressive aphasia with very mild right hemiparesis.   PLAN: -Continue Lipitor for secondary stroke prevention -Repeat CT head and if resolution or decreased size of hemorrhage, we will restart Eliquis at that time for secondary stroke prevention and atrial fibrillation management -F/u with PCP regarding your HLD and HTN management -f/u with cardiologist as scheduled for atrial fibrillation and Eliquis  management -continue to monitor BP at home -Continue current therapies for residual deficits -Maintain strict control of hypertension with blood pressure goal below 130/90, diabetes with hemoglobin A1c goal below 6.5% and cholesterol with LDL cholesterol (bad cholesterol) goal below 70 mg/dL. I also advised the patient to eat a healthy diet with plenty of whole grains, cereals, fruits and vegetables, exercise regularly and maintain ideal body weight.  Follow up in 3 months or call earlier if needed   Greater than 50% of time during this 25 minute visit was spent on counseling,explanation of diagnosis of hemorrhagic stroke, reviewing risk factor management of HTN, HLD, AF and DVT history, planning of further management, discussion with patient and family and coordination of care    Venancio Poisson, [facility]  [facility] [address] [place], [place] [address]  Phone [phone] Fax [phone]

## 2017-09-24 ENCOUNTER — Telehealth: Payer: Self-pay | Admitting: Adult Health

## 2017-09-24 ENCOUNTER — Ambulatory Visit (INDEPENDENT_AMBULATORY_CARE_PROVIDER_SITE_OTHER): Payer: Medicare Other | Admitting: Adult Health

## 2017-09-24 ENCOUNTER — Encounter: Payer: Self-pay | Admitting: Adult Health

## 2017-09-24 VITALS — BP 124/73 | HR 64 | Wt 145.4 lb

## 2017-09-24 DIAGNOSIS — I69198 Other sequelae of nontraumatic intracerebral hemorrhage: Secondary | ICD-10-CM | POA: Diagnosis not present

## 2017-09-24 DIAGNOSIS — E785 Hyperlipidemia, unspecified: Secondary | ICD-10-CM

## 2017-09-24 DIAGNOSIS — R2689 Other abnormalities of gait and mobility: Secondary | ICD-10-CM | POA: Diagnosis not present

## 2017-09-24 DIAGNOSIS — E7800 Pure hypercholesterolemia, unspecified: Secondary | ICD-10-CM | POA: Diagnosis not present

## 2017-09-24 DIAGNOSIS — M62561 Muscle wasting and atrophy, not elsewhere classified, right lower leg: Secondary | ICD-10-CM | POA: Diagnosis not present

## 2017-09-24 DIAGNOSIS — I610 Nontraumatic intracerebral hemorrhage in hemisphere, subcortical: Secondary | ICD-10-CM | POA: Diagnosis not present

## 2017-09-24 DIAGNOSIS — I10 Essential (primary) hypertension: Secondary | ICD-10-CM

## 2017-09-24 DIAGNOSIS — I69314 Frontal lobe and executive function deficit following cerebral infarction: Secondary | ICD-10-CM | POA: Diagnosis not present

## 2017-09-24 DIAGNOSIS — I69151 Hemiplegia and hemiparesis following nontraumatic intracerebral hemorrhage affecting right dominant side: Secondary | ICD-10-CM | POA: Diagnosis not present

## 2017-09-24 DIAGNOSIS — I69320 Aphasia following cerebral infarction: Secondary | ICD-10-CM | POA: Diagnosis not present

## 2017-09-24 NOTE — Telephone Encounter (Signed)
Medicare/GHI order sent to GI. They will reach out to the pt to schedule.

## 2017-09-24 NOTE — Patient Instructions (Addendum)
Continue lipitor  for secondary stroke prevention  Repeat Ct Scan of head - if bleed resolved, we will restart Eliquis  Continue to follow up with PCP regarding cholesterol and blood pressure management   Continue to follow up with cardiologist regarding atrial fibrillation and eliquis management   Continue current therapies for continued right weakness and speech difficulties   Continue to monitor blood pressure at home  Maintain strict control of hypertension with blood pressure goal below 130/90, diabetes with hemoglobin A1c goal below 6.5% and cholesterol with LDL cholesterol (bad cholesterol) goal below 70 mg/dL. I also advised the patient to eat a healthy diet with plenty of whole grains, cereals, fruits and vegetables, exercise regularly and maintain ideal body weight.   Followup in the future with me in 3 months or call earlier if needed       Thank you for coming to see [place] at [facility]. I hope we have been able to provide you high quality care today.  You may receive a patient satisfaction survey over the next few weeks. We would appreciate your feedback and comments so that we may continue to improve ourselves and the health of our patients.

## 2017-09-25 ENCOUNTER — Telehealth: Payer: Self-pay | Admitting: *Deleted

## 2017-09-25 DIAGNOSIS — I69314 Frontal lobe and executive function deficit following cerebral infarction: Secondary | ICD-10-CM | POA: Diagnosis not present

## 2017-09-25 DIAGNOSIS — I69320 Aphasia following cerebral infarction: Secondary | ICD-10-CM | POA: Diagnosis not present

## 2017-09-25 DIAGNOSIS — R2689 Other abnormalities of gait and mobility: Secondary | ICD-10-CM | POA: Diagnosis not present

## 2017-09-25 DIAGNOSIS — M62561 Muscle wasting and atrophy, not elsewhere classified, right lower leg: Secondary | ICD-10-CM | POA: Diagnosis not present

## 2017-09-25 DIAGNOSIS — I69198 Other sequelae of nontraumatic intracerebral hemorrhage: Secondary | ICD-10-CM | POA: Diagnosis not present

## 2017-09-25 DIAGNOSIS — I69151 Hemiplegia and hemiparesis following nontraumatic intracerebral hemorrhage affecting right dominant side: Secondary | ICD-10-CM | POA: Diagnosis not present

## 2017-09-25 NOTE — Telephone Encounter (Signed)
Mr Jasper, son, called about the appointment with Dr Letta Pate.  He wanted more information as to why she needed to be seen. He is going to be out of town. I explained that we follow up after hospital discharge to evaluate therapy and additional orders if needed or transition to outpt therapy. His mother is at [facility] and is undergoing therapy through their rehab and it is uncertain if their orders come from Dr Letta Pate or not.  He will follow up with them and he may cancel the appt but will leave as is for now.

## 2017-09-25 NOTE — Progress Notes (Signed)
I agree with the above plan 

## 2017-09-26 DIAGNOSIS — R2689 Other abnormalities of gait and mobility: Secondary | ICD-10-CM | POA: Diagnosis not present

## 2017-09-26 DIAGNOSIS — I69320 Aphasia following cerebral infarction: Secondary | ICD-10-CM | POA: Diagnosis not present

## 2017-09-26 DIAGNOSIS — I69314 Frontal lobe and executive function deficit following cerebral infarction: Secondary | ICD-10-CM | POA: Diagnosis not present

## 2017-09-26 DIAGNOSIS — I69198 Other sequelae of nontraumatic intracerebral hemorrhage: Secondary | ICD-10-CM | POA: Diagnosis not present

## 2017-09-26 DIAGNOSIS — M62561 Muscle wasting and atrophy, not elsewhere classified, right lower leg: Secondary | ICD-10-CM | POA: Diagnosis not present

## 2017-09-26 DIAGNOSIS — I69151 Hemiplegia and hemiparesis following nontraumatic intracerebral hemorrhage affecting right dominant side: Secondary | ICD-10-CM | POA: Diagnosis not present

## 2017-09-29 DIAGNOSIS — I69151 Hemiplegia and hemiparesis following nontraumatic intracerebral hemorrhage affecting right dominant side: Secondary | ICD-10-CM | POA: Diagnosis not present

## 2017-09-29 DIAGNOSIS — R2689 Other abnormalities of gait and mobility: Secondary | ICD-10-CM | POA: Diagnosis not present

## 2017-09-29 DIAGNOSIS — I69314 Frontal lobe and executive function deficit following cerebral infarction: Secondary | ICD-10-CM | POA: Diagnosis not present

## 2017-09-29 DIAGNOSIS — M62561 Muscle wasting and atrophy, not elsewhere classified, right lower leg: Secondary | ICD-10-CM | POA: Diagnosis not present

## 2017-09-29 DIAGNOSIS — I69320 Aphasia following cerebral infarction: Secondary | ICD-10-CM | POA: Diagnosis not present

## 2017-09-29 DIAGNOSIS — I69198 Other sequelae of nontraumatic intracerebral hemorrhage: Secondary | ICD-10-CM | POA: Diagnosis not present

## 2017-09-30 ENCOUNTER — Telehealth: Payer: Self-pay

## 2017-09-30 ENCOUNTER — Inpatient Hospital Stay: Payer: Medicare Other | Admitting: Physical Medicine & Rehabilitation

## 2017-09-30 DIAGNOSIS — I69198 Other sequelae of nontraumatic intracerebral hemorrhage: Secondary | ICD-10-CM | POA: Diagnosis not present

## 2017-09-30 DIAGNOSIS — R2689 Other abnormalities of gait and mobility: Secondary | ICD-10-CM | POA: Diagnosis not present

## 2017-09-30 DIAGNOSIS — I69314 Frontal lobe and executive function deficit following cerebral infarction: Secondary | ICD-10-CM | POA: Diagnosis not present

## 2017-09-30 DIAGNOSIS — I69320 Aphasia following cerebral infarction: Secondary | ICD-10-CM | POA: Diagnosis not present

## 2017-09-30 DIAGNOSIS — M62561 Muscle wasting and atrophy, not elsewhere classified, right lower leg: Secondary | ICD-10-CM | POA: Diagnosis not present

## 2017-09-30 DIAGNOSIS — I69151 Hemiplegia and hemiparesis following nontraumatic intracerebral hemorrhage affecting right dominant side: Secondary | ICD-10-CM | POA: Diagnosis not present

## 2017-09-30 NOTE — Telephone Encounter (Signed)
Rn call [facility] facility that the patients DNR was left with the nursing home packet. Rn was advised to mail DNR form to Attention Adline Peals RN [address] [place] [address]. DNR given to medical records for mailing.

## 2017-10-01 DIAGNOSIS — M62561 Muscle wasting and atrophy, not elsewhere classified, right lower leg: Secondary | ICD-10-CM | POA: Diagnosis not present

## 2017-10-01 DIAGNOSIS — I69320 Aphasia following cerebral infarction: Secondary | ICD-10-CM | POA: Diagnosis not present

## 2017-10-01 DIAGNOSIS — R2689 Other abnormalities of gait and mobility: Secondary | ICD-10-CM | POA: Diagnosis not present

## 2017-10-01 DIAGNOSIS — I69198 Other sequelae of nontraumatic intracerebral hemorrhage: Secondary | ICD-10-CM | POA: Diagnosis not present

## 2017-10-01 DIAGNOSIS — I69151 Hemiplegia and hemiparesis following nontraumatic intracerebral hemorrhage affecting right dominant side: Secondary | ICD-10-CM | POA: Diagnosis not present

## 2017-10-01 DIAGNOSIS — I69314 Frontal lobe and executive function deficit following cerebral infarction: Secondary | ICD-10-CM | POA: Diagnosis not present

## 2017-10-02 DIAGNOSIS — I69314 Frontal lobe and executive function deficit following cerebral infarction: Secondary | ICD-10-CM | POA: Diagnosis not present

## 2017-10-02 DIAGNOSIS — I69198 Other sequelae of nontraumatic intracerebral hemorrhage: Secondary | ICD-10-CM | POA: Diagnosis not present

## 2017-10-02 DIAGNOSIS — I69151 Hemiplegia and hemiparesis following nontraumatic intracerebral hemorrhage affecting right dominant side: Secondary | ICD-10-CM | POA: Diagnosis not present

## 2017-10-02 DIAGNOSIS — I69320 Aphasia following cerebral infarction: Secondary | ICD-10-CM | POA: Diagnosis not present

## 2017-10-02 DIAGNOSIS — R2689 Other abnormalities of gait and mobility: Secondary | ICD-10-CM | POA: Diagnosis not present

## 2017-10-02 DIAGNOSIS — M62561 Muscle wasting and atrophy, not elsewhere classified, right lower leg: Secondary | ICD-10-CM | POA: Diagnosis not present

## 2017-10-03 DIAGNOSIS — I69320 Aphasia following cerebral infarction: Secondary | ICD-10-CM | POA: Diagnosis not present

## 2017-10-03 DIAGNOSIS — I69198 Other sequelae of nontraumatic intracerebral hemorrhage: Secondary | ICD-10-CM | POA: Diagnosis not present

## 2017-10-03 DIAGNOSIS — I69151 Hemiplegia and hemiparesis following nontraumatic intracerebral hemorrhage affecting right dominant side: Secondary | ICD-10-CM | POA: Diagnosis not present

## 2017-10-03 DIAGNOSIS — I69314 Frontal lobe and executive function deficit following cerebral infarction: Secondary | ICD-10-CM | POA: Diagnosis not present

## 2017-10-03 DIAGNOSIS — R2689 Other abnormalities of gait and mobility: Secondary | ICD-10-CM | POA: Diagnosis not present

## 2017-10-03 DIAGNOSIS — M62561 Muscle wasting and atrophy, not elsewhere classified, right lower leg: Secondary | ICD-10-CM | POA: Diagnosis not present

## 2017-10-06 ENCOUNTER — Ambulatory Visit
Admission: RE | Admit: 2017-10-06 | Discharge: 2017-10-06 | Disposition: A | Discharge status: Home / Self Care | Payer: Medicare Other | Source: Ambulatory Visit | Attending: Adult Health | Admitting: Adult Health

## 2017-10-06 DIAGNOSIS — I69151 Hemiplegia and hemiparesis following nontraumatic intracerebral hemorrhage affecting right dominant side: Secondary | ICD-10-CM | POA: Diagnosis not present

## 2017-10-06 DIAGNOSIS — I69320 Aphasia following cerebral infarction: Secondary | ICD-10-CM | POA: Diagnosis not present

## 2017-10-06 DIAGNOSIS — R2689 Other abnormalities of gait and mobility: Secondary | ICD-10-CM | POA: Diagnosis not present

## 2017-10-06 DIAGNOSIS — I69198 Other sequelae of nontraumatic intracerebral hemorrhage: Secondary | ICD-10-CM | POA: Diagnosis not present

## 2017-10-06 DIAGNOSIS — M62561 Muscle wasting and atrophy, not elsewhere classified, right lower leg: Secondary | ICD-10-CM | POA: Diagnosis not present

## 2017-10-06 DIAGNOSIS — I610 Nontraumatic intracerebral hemorrhage in hemisphere, subcortical: Secondary | ICD-10-CM | POA: Diagnosis not present

## 2017-10-06 DIAGNOSIS — I69314 Frontal lobe and executive function deficit following cerebral infarction: Secondary | ICD-10-CM | POA: Diagnosis not present

## 2017-10-07 ENCOUNTER — Other Ambulatory Visit: Payer: Self-pay | Admitting: Adult Health

## 2017-10-07 ENCOUNTER — Telehealth: Payer: Self-pay

## 2017-10-07 DIAGNOSIS — I69151 Hemiplegia and hemiparesis following nontraumatic intracerebral hemorrhage affecting right dominant side: Secondary | ICD-10-CM | POA: Diagnosis not present

## 2017-10-07 DIAGNOSIS — R2689 Other abnormalities of gait and mobility: Secondary | ICD-10-CM | POA: Diagnosis not present

## 2017-10-07 DIAGNOSIS — I69314 Frontal lobe and executive function deficit following cerebral infarction: Secondary | ICD-10-CM | POA: Diagnosis not present

## 2017-10-07 DIAGNOSIS — I69198 Other sequelae of nontraumatic intracerebral hemorrhage: Secondary | ICD-10-CM | POA: Diagnosis not present

## 2017-10-07 DIAGNOSIS — M62561 Muscle wasting and atrophy, not elsewhere classified, right lower leg: Secondary | ICD-10-CM | POA: Diagnosis not present

## 2017-10-07 DIAGNOSIS — I69320 Aphasia following cerebral infarction: Secondary | ICD-10-CM | POA: Diagnosis not present

## 2017-10-07 MED ORDER — APIXABAN 2.5 MG PO TABS: 2.5000 mg | ORAL_TABLET | Freq: Two times a day (BID) | ORAL | 2 refills | Status: DC

## 2017-10-07 NOTE — Progress Notes (Signed)
Electrophysiology Office Note Date: 10/09/2017  ID:  Sara Russell, Sara Russell 03/09/1930, MRN [id number]  PCP: Sara Resides, MD Electrophysiologist: Sara Russell  CC: AF follow up  Sara Russell is a 84 y.o. female seen today for Dr Sara Russell.  She presents today for routine electrophysiology followup.  She was admitted 07/2017 with acute stroke and found to have Sara Russell in the setting of Eliquis use. She was evaluated by neurology. Eliquis was discontinued and ASA started at discharge. Plan was to repeat head CT at 1 month and make decision about restarting Eliquis at that time if ICH resolved. Repeat CT 10/06/17 demonstrated almost complete resolution of left thalamic hemorrhage. Neurology has cleared to restart Eliquis but she has not restarted yet. Since discharge, she is doing relatively well. She lives at [facility] and went to rehab for a short time. She has moved back out to more independent living. Physically, she is at baseline. She does still have some speech difficulties.   She denies chest pain, palpitations, dyspnea, PND, orthopnea, nausea, vomiting, dizziness, syncope, edema, weight gain, or early satiety.  Past Medical History:  Diagnosis Date  . Abdominal mass   . AKI (acute kidney injury) ([place]) 09/15/2014  . Atrial fibrillation ([place])   . Colitis, ulcerative ([place])   . Cough    WHITE SPUTUM FOR 2 WEEKS, NO FEVER  . Degenerative joint disease   . Diverticulosis   . DVT (deep venous thrombosis) ([place]) 2009   BOTH LUNGS  . GERD (gastroesophageal reflux disease)   . Hyperlipidemia   . Hyperplastic colon polyp   . Hypertension   . Hypothyroidism   . Iron deficiency anemia    ON IRON AT TIMES  . PE (pulmonary embolism) 2009  . Persistent atrial fibrillation ([place])   . Positive H. pylori test 01/20/96  . S/P dilatation of esophageal stricture 1992  . Skin cancer    h/o basal and squamous skin cancer--> removed  . Stroke [facility])    Past Surgical History:  Procedure Laterality  Date  . APPENDECTOMY    . BREAST EXCISIONAL BIOPSY Left 1992   benign  . BREAST LUMPECTOMY     benign  . DILATION AND CURETTAGE OF UTERUS     for endometrial polyps  . ENDOSCOPIC RETROGRADE CHOLANGIOPANCREATOGRAPHY (ERCP) WITH PROPOFOL N/A 03/23/2015   Procedure: ENDOSCOPIC RETROGRADE CHOLANGIOPANCREATOGRAPHY (ERCP) WITH PROPOFOL;  Surgeon: Milus Banister, MD;  Location: [facility];  Service: Endoscopy;  Laterality: N/A;  . JOINT REPLACEMENT    . ORIF ANKLE FRACTURE Left    X 2  . REPLACEMENT TOTAL KNEE     left  . SURGERY DONE TO REMOVE LEFT ANKLE HARDWARE LATER    . TONSILLECTOMY      Current Outpatient Medications  Medication Sig Dispense Refill  . acetaminophen (TYLENOL ARTHRITIS PAIN) 650 MG CR tablet Take 650 mg by mouth every 8 (eight) hours as needed for pain.     Marland Kitchen amiodarone (PACERONE) 100 MG tablet Take 1 tablet (100 mg total) by mouth daily. 90 tablet 3  . atorvastatin (LIPITOR) 10 MG tablet Take 1 tablet (10 mg total) by mouth daily. 90 tablet 3  . bethanechol (URECHOLINE) 10 MG tablet Take 1 tablet (10 mg total) by mouth 3 (three) times daily.    . carvedilol (COREG) 6.25 MG tablet Take 3 tablets (18.75 mg total) by mouth 2 (two) times daily with a meal.    . feeding supplement, ENSURE ENLIVE, (ENSURE ENLIVE) LIQD Take 237 mLs by  mouth 2 (two) times daily between meals. 237 mL 12  . hydroxypropyl methylcellulose / hypromellose (ISOPTO TEARS / GONIOVISC) 2.5 % ophthalmic solution Place 1 drop into both eyes as needed for dry eyes. 15 mL 12  . levothyroxine (SYNTHROID, LEVOTHROID) 75 MCG tablet Take 1 tablet (75 mcg total) by mouth daily. 90 tablet 3  . pantoprazole (PROTONIX) 40 MG tablet Take 1 tablet (40 mg total) by mouth daily.    . potassium chloride 20 MEQ/15ML (10%) SOLN Take 7.5 mLs (10 mEq total) by mouth daily. 450 mL 0  . senna-docusate (SENOKOT-S) 8.6-50 MG tablet Take 1 tablet by mouth 2 (two) times daily.    . Simethicone (GAS RELIEF PO) Take 1 tablet by  mouth 2 (two) times daily.    Marland Kitchen tiZANidine (ZANAFLEX) 2 MG tablet Take 1 tablet (2 mg total) by mouth 4 (four) times daily -  with meals and at bedtime. 30 tablet 0  . traZODone (DESYREL) 50 MG tablet Take 25-50 mg by mouth at bedtime.     No current facility-administered medications for this visit.     Allergies:   Iohexol; Ivp dye [iodinated diagnostic agents]; Scallops [shellfish allergy]; Shellfish-derived products; Sulfa antibiotics; and Sulfamethoxazole   Social History: Social History   Socioeconomic History  . Marital status: Widowed    Spouse name: Not on file  . Number of children: 3  . Years of education: college  . Highest education level: Bachelor's degree (e.g., BA, AB, BS)  Occupational History  . Occupation: retired Optician, dispensing: RETIRED  Social Needs  . Financial resource strain: Not on file  . Food insecurity:    Worry: Never true    Inability: Not on file  . Transportation needs:    Medical: No    Non-medical: No  Tobacco Use  . Smoking status: Former Smoker    Packs/day: 0.50    Years: 13.00    Pack years: 6.50    Types: Cigarettes    Start date: 02/12/1948    Last attempt to quit: 02/11/1961    Years since quitting: 56.6  . Smokeless tobacco: Never Used  . Tobacco comment: Denies use of tobacco since 1963  Substance and Sexual Activity  . Alcohol use: Yes    Alcohol/week: 5.0 standard drinks    Types: 5 Glasses of wine per week    Comment: WINE FEW TIMES PER WEEK  . Drug use: No  . Sexual activity: Never  Lifestyle  . Physical activity:    Days per week: Not on file    Minutes per session: Not on file  . Stress: Not on file  Relationships  . Social connections:    Talks on phone: Not on file    Gets together: Not on file    Attends religious service: Not on file    Active member of club or organization: Not on file    Attends meetings of clubs or organizations: Not on file    Relationship status: Not on file  . Intimate partner  violence:    Fear of current or ex partner: Not on file    Emotionally abused: Not on file    Physically abused: Not on file    Forced sexual activity: Not on file  Other Topics Concern  . Not on file  Social History Narrative   Health Care POA:    Emergency Contact: son, Halea Lieb (c) [phone] [name])   Diet: Pt has a varied diet of protein, starch, vegetables.  Exercise: Pt has no regular exercise plan because of pain in back.   Seatbelts: Pt reports wearing seatbelt when in vehicle.   Nancy Fetter Exposure/Protection: Pt reports wearing sun screen.   Hobbies: reading, traveling, eating out.      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 Ducatte, RN, BSN                                                                                                     Family History: Family History  Problem Relation Age of Onset  . Esophageal cancer Brother   . Diverticulitis Brother   . Heart disease Mother   . Stroke Mother   . Dementia Mother   . Heart attack Mother   . Heart disease Father   . Atrial fibrillation Father   . Breast cancer  Maternal Aunt   . Colon cancer Neg Hx     Review of Systems: All other systems reviewed and are otherwise negative except as noted above.   Physical Exam: VS:  BP 112/60   Pulse 66   Ht 5' (1.524 m)   Wt 148 lb 6.4 oz (67.3 kg)   SpO2 97%   BMI 28.98 kg/m  , BMI Body mass index is 28.98 kg/m. Wt Readings from Last 3 Encounters:  10/09/17 148 lb 6.4 oz (67.3 kg)  09/24/17 145 lb 6.4 oz (66 kg)  09/22/17 149 lb 9.6 oz (67.9 kg)    GEN- The patient is elderly appearing, alert and oriented x 3 today.   HEENT: normocephalic, atraumatic; sclera clear, conjunctiva pink; hearing intact; oropharynx clear; neck supple  Lungs- Clear to ausculation bilaterally, normal work of breathing.  No wheezes, rales, rhonchi Heart- Regular rate and rhythm  GI- soft, non-tender, non-distended, bowel sounds present Extremities- no clubbing, cyanosis, or edema  MS- no significant deformity or atrophy Skin- warm and dry, no rash or lesion  Psych- euthymic mood, full affect Neuro- strength and sensation are intact   EKG:  EKG is not ordered today.  Recent Labs: 08/07/2017: ALT 12 08/29/2017: BUN 39; Creatinine 1.2; Hemoglobin 11.6; Platelets 292; Potassium 3.8; Sodium 137; TSH 2.72  Other studies Reviewed: Additional studies/ records that were reviewed today include: hospital records  Review of the above records today demonstrates: as above  Assessment and Plan:  1.  Persistent atrial fibrillation Maintaining SR on low dose amiodarone CHADS2VASC is 6 She had spontaneous ICH on Eliquis 07/2017.  Repeat head CT 09/2017 with resolution of ICH and Eliquis was recommended to be resumed per neurology but not yet done. Long discussion with patient and son today regarding options including resuming [place], ILR implant to look for AF burden, Watchman.  She would like to proceed with ILR implant to better define AF burden and guide decision regarding [place].  Risks, benefits reviewed. Will plan for next week  with Dr Sara Russell.  2.  HTN Stable No change required today    Current medicines are reviewed at length with the patient today.   The patient does not have concerns regarding her medicines.  The following changes were made today:  none  Labs/ tests ordered today include: none No orders of the defined types were placed in this encounter.    Disposition:   Follow up with Dr Sara Russell 6 weeks    Signed, Chanetta Marshall, NP 10/09/2017 11:14 AM   [facility] [address] [place] [address] [phone] (office) [phone] (fax)

## 2017-10-07 NOTE — H&P (View-Only) (Signed)
Electrophysiology Office Note Date: 10/09/2017  ID:  Sara, Russell January 04, 1931, MRN [id number]  PCP: Sara Resides, MD Electrophysiologist: Sara Russell  CC: AF follow up  Sara Russell is a 84 y.o. female seen today for Dr Sara Russell.  She presents today for routine electrophysiology followup.  She was admitted 07/2017 with acute stroke and found to have [place] in the setting of Eliquis use. She was evaluated by neurology. Eliquis was discontinued and ASA started at discharge. Plan was to repeat head CT at 1 month and make decision about restarting Eliquis at that time if ICH resolved. Repeat CT 10/06/17 demonstrated almost complete resolution of left thalamic hemorrhage. Neurology has cleared to restart Eliquis but she has not restarted yet. Since discharge, she is doing relatively well. She lives at [facility] and went to rehab for a short time. She has moved back out to more independent living. Physically, she is at baseline. She does still have some speech difficulties.   She denies chest pain, palpitations, dyspnea, PND, orthopnea, nausea, vomiting, dizziness, syncope, edema, weight gain, or early satiety.  Past Medical History:  Diagnosis Date  . Abdominal mass   . AKI (acute kidney injury) ([place]) 09/15/2014  . Atrial fibrillation ([place])   . Colitis, ulcerative ([place])   . Cough    WHITE SPUTUM FOR 2 WEEKS, NO FEVER  . Degenerative joint disease   . Diverticulosis   . DVT (deep venous thrombosis) ([place]) 2009   BOTH LUNGS  . GERD (gastroesophageal reflux disease)   . Hyperlipidemia   . Hyperplastic colon polyp   . Hypertension   . Hypothyroidism   . Iron deficiency anemia    ON IRON AT TIMES  . PE (pulmonary embolism) 2009  . Persistent atrial fibrillation ([place])   . Positive H. pylori test 01/20/96  . S/P dilatation of esophageal stricture 1992  . Skin cancer    h/o basal and squamous skin cancer--> removed  . Stroke [facility])    Past Surgical History:  Procedure Laterality  Date  . APPENDECTOMY    . BREAST EXCISIONAL BIOPSY Left 1992   benign  . BREAST LUMPECTOMY     benign  . DILATION AND CURETTAGE OF UTERUS     for endometrial polyps  . ENDOSCOPIC RETROGRADE CHOLANGIOPANCREATOGRAPHY (ERCP) WITH PROPOFOL N/A 03/23/2015   Procedure: ENDOSCOPIC RETROGRADE CHOLANGIOPANCREATOGRAPHY (ERCP) WITH PROPOFOL;  Surgeon: Milus Banister, MD;  [facility];  Service: Endoscopy;  Laterality: N/A;  . JOINT REPLACEMENT    . ORIF ANKLE FRACTURE Left    X 2  . REPLACEMENT TOTAL KNEE     left  . SURGERY DONE TO REMOVE LEFT ANKLE HARDWARE LATER    . TONSILLECTOMY      Current Outpatient Medications  Medication Sig Dispense Refill  . acetaminophen (TYLENOL ARTHRITIS PAIN) 650 MG CR tablet Take 650 mg by mouth every 8 (eight) hours as needed for pain.     Marland Kitchen amiodarone (PACERONE) 100 MG tablet Take 1 tablet (100 mg total) by mouth daily. 90 tablet 3  . atorvastatin (LIPITOR) 10 MG tablet Take 1 tablet (10 mg total) by mouth daily. 90 tablet 3  . bethanechol (URECHOLINE) 10 MG tablet Take 1 tablet (10 mg total) by mouth 3 (three) times daily.    . carvedilol (COREG) 6.25 MG tablet Take 3 tablets (18.75 mg total) by mouth 2 (two) times daily with a meal.    . feeding supplement, ENSURE ENLIVE, (ENSURE ENLIVE) LIQD Take 237 mLs by  mouth 2 (two) times daily between meals. 237 mL 12  . hydroxypropyl methylcellulose / hypromellose (ISOPTO TEARS / GONIOVISC) 2.5 % ophthalmic solution Place 1 drop into both eyes as needed for dry eyes. 15 mL 12  . levothyroxine (SYNTHROID, LEVOTHROID) 75 MCG tablet Take 1 tablet (75 mcg total) by mouth daily. 90 tablet 3  . pantoprazole (PROTONIX) 40 MG tablet Take 1 tablet (40 mg total) by mouth daily.    . potassium chloride 20 MEQ/15ML (10%) SOLN Take 7.5 mLs (10 mEq total) by mouth daily. 450 mL 0  . senna-docusate (SENOKOT-S) 8.6-50 MG tablet Take 1 tablet by mouth 2 (two) times daily.    . Simethicone (GAS RELIEF PO) Take 1 tablet by  mouth 2 (two) times daily.    Marland Kitchen tiZANidine (ZANAFLEX) 2 MG tablet Take 1 tablet (2 mg total) by mouth 4 (four) times daily -  with meals and at bedtime. 30 tablet 0  . traZODone (DESYREL) 50 MG tablet Take 25-50 mg by mouth at bedtime.     No current facility-administered medications for this visit.     Allergies:   Iohexol; Ivp dye [iodinated diagnostic agents]; Scallops [shellfish allergy]; Shellfish-derived products; Sulfa antibiotics; and Sulfamethoxazole   Social History: Social History   Socioeconomic History  . Marital status: Widowed    Spouse name: Not on file  . Number of children: 3  . Years of education: college  . Highest education level: Bachelor's degree (e.g., BA, AB, BS)  Occupational History  . Occupation: retired Optician, dispensing: RETIRED  Social Needs  . Financial resource strain: Not on file  . Food insecurity:    Worry: Never true    Inability: Not on file  . Transportation needs:    Medical: No    Non-medical: No  Tobacco Use  . Smoking status: Former Smoker    Packs/day: 0.50    Years: 13.00    Pack years: 6.50    Types: Cigarettes    Start date: 02/12/1948    Last attempt to quit: 02/11/1961    Years since quitting: 56.6  . Smokeless tobacco: Never Used  . Tobacco comment: Denies use of tobacco since 1963  Substance and Sexual Activity  . Alcohol use: Yes    Alcohol/week: 5.0 standard drinks    Types: 5 Glasses of wine per week    Comment: WINE FEW TIMES PER WEEK  . Drug use: No  . Sexual activity: Never  Lifestyle  . Physical activity:    Days per week: Not on file    Minutes per session: Not on file  . Stress: Not on file  Relationships  . Social connections:    Talks on phone: Not on file    Gets together: Not on file    Attends religious service: Not on file    Active member of club or organization: Not on file    Attends meetings of clubs or organizations: Not on file    Relationship status: Not on file  . Intimate partner  violence:    Fear of current or ex partner: Not on file    Emotionally abused: Not on file    Physically abused: Not on file    Forced sexual activity: Not on file  Other Topics Concern  . Not on file  Social History Narrative   Health Care POA:    Emergency Contact: son, Avabella Wailes (c) [phone] [name])   Diet: Pt has a varied diet of protein, starch, vegetables.  Exercise: Pt has no regular exercise plan because of pain in back.   Seatbelts: Pt reports wearing seatbelt when in vehicle.   Nancy Fetter Exposure/Protection: Pt reports wearing sun screen.   Hobbies: reading, traveling, eating out.      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 Ducatte, RN, BSN                                                                                                     Family History: Family History  Problem Relation Age of Onset  . Esophageal cancer Brother   . Diverticulitis Brother   . Heart disease Mother   . Stroke Mother   . Dementia Mother   . Heart attack Mother   . Heart disease Father   . Atrial fibrillation Father   . Breast cancer  Maternal Aunt   . Colon cancer Neg Hx     Review of Systems: All other systems reviewed and are otherwise negative except as noted above.   Physical Exam: VS:  BP 112/60   Pulse 66   Ht 5' (1.524 m)   Wt 148 lb 6.4 oz (67.3 kg)   SpO2 97%   BMI 28.98 kg/m  , BMI Body mass index is 28.98 kg/m. Wt Readings from Last 3 Encounters:  10/09/17 148 lb 6.4 oz (67.3 kg)  09/24/17 145 lb 6.4 oz (66 kg)  09/22/17 149 lb 9.6 oz (67.9 kg)    GEN- The patient is elderly appearing, alert and oriented x 3 today.   HEENT: normocephalic, atraumatic; sclera clear, conjunctiva pink; hearing intact; oropharynx clear; neck supple  Lungs- Clear to ausculation bilaterally, normal work of breathing.  No wheezes, rales, rhonchi Heart- Regular rate and rhythm  GI- soft, non-tender, non-distended, bowel sounds present Extremities- no clubbing, cyanosis, or edema  MS- no significant deformity or atrophy Skin- warm and dry, no rash or lesion  Psych- euthymic mood, full affect Neuro- strength and sensation are intact   EKG:  EKG is not ordered today.  Recent Labs: 08/07/2017: ALT 12 08/29/2017: BUN 39; Creatinine 1.2; Hemoglobin 11.6; Platelets 292; Potassium 3.8; Sodium 137; TSH 2.72  Other studies Reviewed: Additional studies/ records that were reviewed today include: hospital records  Review of the above records today demonstrates: as above  Assessment and Plan:  1.  Persistent atrial fibrillation Maintaining SR on low dose amiodarone CHADS2VASC is 6 She had spontaneous ICH on Eliquis 07/2017.  Repeat head CT 09/2017 with resolution of ICH and Eliquis was recommended to be resumed per neurology but not yet done. Long discussion with patient and son today regarding options including resuming [place], ILR implant to look for AF burden, Watchman.  She would like to proceed with ILR implant to better define AF burden and guide decision regarding [place].  Risks, benefits reviewed. Will plan for next week  with Dr Sara Russell.  2.  HTN Stable No change required today    Current medicines are reviewed at length with the patient today.   The patient does not have concerns regarding her medicines.  The following changes were made today:  none  Labs/ tests ordered today include: none No orders of the defined types were placed in this encounter.    Disposition:   Follow up with Dr Sara Russell 6 weeks    Signed, Chanetta Marshall, NP 10/09/2017 11:14 AM   [facility] [address] [place] [address] [phone] (office) [phone] (fax)

## 2017-10-07 NOTE — Telephone Encounter (Addendum)
RN call [facility] at [phone] to give orders of Ct scan, and to start eliquis. Rn was transferred to [phone] pt was not in rehab anymore she had move. Rn was given number of [phone], pt was not in that assistant living area,she was enhanced assisted living.. Rn was given another number of [phone], to [facility]. Rn finally spoke to [place] for patient. Rn gave verbal order per Janett Billow NP of to restart eliquis of 2.17m bid. Rn stated the CT scan showed almost full resolution of hemorrhage. RN stated the medication is for stroke prevention. Akiva RN stated order form will be fax. Rn gave fax of [phone]

## 2017-10-07 NOTE — Telephone Encounter (Signed)
Order form receive. Janett Billow NP completed to start eliquis of 2.109m twice daily. Order form fax back to fax number of [phone]. Fax number was provided by ALawana Pai RN fax order form and it was confirmed as receive.

## 2017-10-07 NOTE — Progress Notes (Signed)
Please notify patient/facility that as her CT head showed almost full resolution of hemorrhage, she can safely restart eliquis for AF and secondary stroke prevention. Order placed.

## 2017-10-07 NOTE — Telephone Encounter (Signed)
Venancio Poisson, NP at 10/07/2017 7:30 AM   Status: Signed    Please notify patient/facility that as her CT head showed almost full resolution of hemorrhage, she can safely restart eliquis for AF and secondary stroke prevention. Order placed.

## 2017-10-07 NOTE — Telephone Encounter (Signed)
Revised. 

## 2017-10-08 DIAGNOSIS — I69151 Hemiplegia and hemiparesis following nontraumatic intracerebral hemorrhage affecting right dominant side: Secondary | ICD-10-CM | POA: Diagnosis not present

## 2017-10-08 DIAGNOSIS — I69198 Other sequelae of nontraumatic intracerebral hemorrhage: Secondary | ICD-10-CM | POA: Diagnosis not present

## 2017-10-08 DIAGNOSIS — I69320 Aphasia following cerebral infarction: Secondary | ICD-10-CM | POA: Diagnosis not present

## 2017-10-08 DIAGNOSIS — R2689 Other abnormalities of gait and mobility: Secondary | ICD-10-CM | POA: Diagnosis not present

## 2017-10-08 DIAGNOSIS — M62561 Muscle wasting and atrophy, not elsewhere classified, right lower leg: Secondary | ICD-10-CM | POA: Diagnosis not present

## 2017-10-08 DIAGNOSIS — I69314 Frontal lobe and executive function deficit following cerebral infarction: Secondary | ICD-10-CM | POA: Diagnosis not present

## 2017-10-09 ENCOUNTER — Ambulatory Visit (INDEPENDENT_AMBULATORY_CARE_PROVIDER_SITE_OTHER): Payer: Medicare Other | Admitting: Nurse Practitioner

## 2017-10-09 ENCOUNTER — Encounter: Payer: Self-pay | Admitting: Nurse Practitioner

## 2017-10-09 ENCOUNTER — Encounter: Payer: Self-pay | Admitting: *Deleted

## 2017-10-09 ENCOUNTER — Encounter

## 2017-10-09 VITALS — BP 112/60 | HR 66 | Ht 60.0 in | Wt 148.4 lb

## 2017-10-09 DIAGNOSIS — I69151 Hemiplegia and hemiparesis following nontraumatic intracerebral hemorrhage affecting right dominant side: Secondary | ICD-10-CM | POA: Diagnosis not present

## 2017-10-09 DIAGNOSIS — I69314 Frontal lobe and executive function deficit following cerebral infarction: Secondary | ICD-10-CM | POA: Diagnosis not present

## 2017-10-09 DIAGNOSIS — I10 Essential (primary) hypertension: Secondary | ICD-10-CM | POA: Diagnosis not present

## 2017-10-09 DIAGNOSIS — R2689 Other abnormalities of gait and mobility: Secondary | ICD-10-CM | POA: Diagnosis not present

## 2017-10-09 DIAGNOSIS — I481 Persistent atrial fibrillation: Secondary | ICD-10-CM | POA: Diagnosis not present

## 2017-10-09 DIAGNOSIS — M62561 Muscle wasting and atrophy, not elsewhere classified, right lower leg: Secondary | ICD-10-CM | POA: Diagnosis not present

## 2017-10-09 DIAGNOSIS — I69198 Other sequelae of nontraumatic intracerebral hemorrhage: Secondary | ICD-10-CM | POA: Diagnosis not present

## 2017-10-09 DIAGNOSIS — I4819 Other persistent atrial fibrillation: Secondary | ICD-10-CM

## 2017-10-09 DIAGNOSIS — I69320 Aphasia following cerebral infarction: Secondary | ICD-10-CM | POA: Diagnosis not present

## 2017-10-09 NOTE — Patient Instructions (Addendum)
Medication Instructions:   Your physician recommends that you continue on your current medications as directed. Please refer to the Current Medication list given to you today.    If you need a refill on your cardiac medications before your next appointment, please call your pharmacy.  Labwork:  NONE ORDERED  TODAY    Testing/Procedures:  YOU HAVE BEEN RECOMMENDED  TO HAVE AN LOOP RECORDER INSERTION FOR YOUR ATRIAL FIBRILLATION     Follow-Up:  [facility] IN  6 WEEKS    Any Other Special Instructions Will Be Listed Below (If Applicable).

## 2017-10-10 DIAGNOSIS — I69151 Hemiplegia and hemiparesis following nontraumatic intracerebral hemorrhage affecting right dominant side: Secondary | ICD-10-CM | POA: Diagnosis not present

## 2017-10-10 DIAGNOSIS — I69198 Other sequelae of nontraumatic intracerebral hemorrhage: Secondary | ICD-10-CM | POA: Diagnosis not present

## 2017-10-10 DIAGNOSIS — M62561 Muscle wasting and atrophy, not elsewhere classified, right lower leg: Secondary | ICD-10-CM | POA: Diagnosis not present

## 2017-10-10 DIAGNOSIS — I69320 Aphasia following cerebral infarction: Secondary | ICD-10-CM | POA: Diagnosis not present

## 2017-10-10 DIAGNOSIS — R2689 Other abnormalities of gait and mobility: Secondary | ICD-10-CM | POA: Diagnosis not present

## 2017-10-10 DIAGNOSIS — I69314 Frontal lobe and executive function deficit following cerebral infarction: Secondary | ICD-10-CM | POA: Diagnosis not present

## 2017-10-13 DIAGNOSIS — M62561 Muscle wasting and atrophy, not elsewhere classified, right lower leg: Secondary | ICD-10-CM | POA: Diagnosis not present

## 2017-10-13 DIAGNOSIS — R2689 Other abnormalities of gait and mobility: Secondary | ICD-10-CM | POA: Diagnosis not present

## 2017-10-13 DIAGNOSIS — I69314 Frontal lobe and executive function deficit following cerebral infarction: Secondary | ICD-10-CM | POA: Diagnosis not present

## 2017-10-13 DIAGNOSIS — I5031 Acute diastolic (congestive) heart failure: Secondary | ICD-10-CM | POA: Diagnosis not present

## 2017-10-13 DIAGNOSIS — I608 Other nontraumatic subarachnoid hemorrhage: Secondary | ICD-10-CM | POA: Diagnosis not present

## 2017-10-13 DIAGNOSIS — I616 Nontraumatic intracerebral hemorrhage, multiple localized: Secondary | ICD-10-CM | POA: Diagnosis not present

## 2017-10-13 DIAGNOSIS — I69028 Other speech and language deficits following nontraumatic subarachnoid hemorrhage: Secondary | ICD-10-CM | POA: Diagnosis not present

## 2017-10-13 DIAGNOSIS — I69051 Hemiplegia and hemiparesis following nontraumatic subarachnoid hemorrhage affecting right dominant side: Secondary | ICD-10-CM | POA: Diagnosis not present

## 2017-10-13 DIAGNOSIS — R278 Other lack of coordination: Secondary | ICD-10-CM | POA: Diagnosis not present

## 2017-10-13 DIAGNOSIS — I69151 Hemiplegia and hemiparesis following nontraumatic intracerebral hemorrhage affecting right dominant side: Secondary | ICD-10-CM | POA: Diagnosis not present

## 2017-10-13 DIAGNOSIS — I69320 Aphasia following cerebral infarction: Secondary | ICD-10-CM | POA: Diagnosis not present

## 2017-10-13 DIAGNOSIS — I69198 Other sequelae of nontraumatic intracerebral hemorrhage: Secondary | ICD-10-CM | POA: Diagnosis not present

## 2017-10-13 DIAGNOSIS — S06360S Traumatic hemorrhage of cerebrum, unspecified, without loss of consciousness, sequela: Secondary | ICD-10-CM | POA: Diagnosis not present

## 2017-10-14 ENCOUNTER — Ambulatory Visit (HOSPITAL_COMMUNITY)
Admission: RE | Admit: 2017-10-14 | Discharge: 2017-10-14 | Disposition: A | Discharge status: Home / Self Care | Payer: Medicare Other | Source: Ambulatory Visit | Attending: Internal Medicine | Admitting: Internal Medicine

## 2017-10-14 ENCOUNTER — Encounter (HOSPITAL_COMMUNITY)
Admission: RE | Disposition: A | Discharge status: Home / Self Care | Payer: Self-pay | Source: Ambulatory Visit | Attending: Internal Medicine

## 2017-10-14 DIAGNOSIS — E039 Hypothyroidism, unspecified: Secondary | ICD-10-CM | POA: Insufficient documentation

## 2017-10-14 DIAGNOSIS — M1990 Unspecified osteoarthritis, unspecified site: Secondary | ICD-10-CM | POA: Insufficient documentation

## 2017-10-14 DIAGNOSIS — R002 Palpitations: Secondary | ICD-10-CM | POA: Diagnosis not present

## 2017-10-14 DIAGNOSIS — Z8673 Personal history of transient ischemic attack (TIA), and cerebral infarction without residual deficits: Secondary | ICD-10-CM | POA: Diagnosis not present

## 2017-10-14 DIAGNOSIS — I481 Persistent atrial fibrillation: Secondary | ICD-10-CM | POA: Diagnosis not present

## 2017-10-14 DIAGNOSIS — K219 Gastro-esophageal reflux disease without esophagitis: Secondary | ICD-10-CM | POA: Diagnosis not present

## 2017-10-14 DIAGNOSIS — Z882 Allergy status to sulfonamides status: Secondary | ICD-10-CM | POA: Diagnosis not present

## 2017-10-14 DIAGNOSIS — Z86718 Personal history of other venous thrombosis and embolism: Secondary | ICD-10-CM | POA: Insufficient documentation

## 2017-10-14 DIAGNOSIS — Z86711 Personal history of pulmonary embolism: Secondary | ICD-10-CM | POA: Diagnosis not present

## 2017-10-14 DIAGNOSIS — Z87891 Personal history of nicotine dependence: Secondary | ICD-10-CM | POA: Insufficient documentation

## 2017-10-14 DIAGNOSIS — Z7901 Long term (current) use of anticoagulants: Secondary | ICD-10-CM | POA: Diagnosis not present

## 2017-10-14 DIAGNOSIS — I10 Essential (primary) hypertension: Secondary | ICD-10-CM | POA: Insufficient documentation

## 2017-10-14 DIAGNOSIS — Z91041 Radiographic dye allergy status: Secondary | ICD-10-CM | POA: Insufficient documentation

## 2017-10-14 DIAGNOSIS — E785 Hyperlipidemia, unspecified: Secondary | ICD-10-CM | POA: Diagnosis not present

## 2017-10-14 DIAGNOSIS — I4891 Unspecified atrial fibrillation: Secondary | ICD-10-CM | POA: Diagnosis not present

## 2017-10-14 HISTORY — PX: LOOP RECORDER INSERTION: EP1214

## 2017-10-14 SURGERY — LOOP RECORDER INSERTION

## 2017-10-14 MED ORDER — LIDOCAINE-EPINEPHRINE 1 %-1:100000 IJ SOLN
INTRAMUSCULAR | Status: DC | PRN
  Administered 2017-10-14: 6 mL

## 2017-10-14 MED ORDER — LIDOCAINE-EPINEPHRINE 1 %-1:100000 IJ SOLN
INTRAMUSCULAR | Status: AC
  Filled 2017-10-14: qty 1

## 2017-10-14 SURGICAL SUPPLY — 2 items
LOOP REVEAL LINQSYS (Prosthesis & Implant Heart) ×2 IMPLANT
PACK LOOP INSERTION (CUSTOM PROCEDURE TRAY) ×3 IMPLANT

## 2017-10-14 NOTE — Progress Notes (Addendum)
Entered in error Please disregard  Thompson Grayer MD, [facility] 10/14/2017 3:37 PM

## 2017-10-14 NOTE — Interval H&P Note (Signed)
History and Physical Interval Note:  10/14/2017 1:44 PM  Sara Russell  has presented today for surgery, with the diagnosis of afib  The various methods of treatment have been discussed with the patient and family. After consideration of risks, benefits and other options for treatment, the patient has consented to  Procedure(s): LOOP RECORDER INSERTION (N/A) as a surgical intervention .  The patient's history has been reviewed, patient examined, no change in status, stable for surgery.  I have reviewed the patient's chart and labs.  Questions were answered to the patient's satisfaction.    I agree with Chanetta Marshall NP that long term monitoring is appropriate for afib management.  I think that if we better understand her afib burden, we can better decide anticoagulation management going forward.  If her AF burden is low, we could potentially reduce her anticoagulation burden.  Thompson Grayer

## 2017-10-15 ENCOUNTER — Encounter (HOSPITAL_COMMUNITY): Payer: Self-pay | Admitting: Internal Medicine

## 2017-10-15 DIAGNOSIS — I69151 Hemiplegia and hemiparesis following nontraumatic intracerebral hemorrhage affecting right dominant side: Secondary | ICD-10-CM | POA: Diagnosis not present

## 2017-10-15 DIAGNOSIS — I69198 Other sequelae of nontraumatic intracerebral hemorrhage: Secondary | ICD-10-CM | POA: Diagnosis not present

## 2017-10-15 DIAGNOSIS — I69314 Frontal lobe and executive function deficit following cerebral infarction: Secondary | ICD-10-CM | POA: Diagnosis not present

## 2017-10-15 DIAGNOSIS — R2689 Other abnormalities of gait and mobility: Secondary | ICD-10-CM | POA: Diagnosis not present

## 2017-10-15 DIAGNOSIS — I69320 Aphasia following cerebral infarction: Secondary | ICD-10-CM | POA: Diagnosis not present

## 2017-10-15 DIAGNOSIS — M62561 Muscle wasting and atrophy, not elsewhere classified, right lower leg: Secondary | ICD-10-CM | POA: Diagnosis not present

## 2017-10-16 DIAGNOSIS — I69151 Hemiplegia and hemiparesis following nontraumatic intracerebral hemorrhage affecting right dominant side: Secondary | ICD-10-CM | POA: Diagnosis not present

## 2017-10-16 DIAGNOSIS — I69320 Aphasia following cerebral infarction: Secondary | ICD-10-CM | POA: Diagnosis not present

## 2017-10-16 DIAGNOSIS — R2689 Other abnormalities of gait and mobility: Secondary | ICD-10-CM | POA: Diagnosis not present

## 2017-10-16 DIAGNOSIS — I69314 Frontal lobe and executive function deficit following cerebral infarction: Secondary | ICD-10-CM | POA: Diagnosis not present

## 2017-10-16 DIAGNOSIS — I69198 Other sequelae of nontraumatic intracerebral hemorrhage: Secondary | ICD-10-CM | POA: Diagnosis not present

## 2017-10-16 DIAGNOSIS — M62561 Muscle wasting and atrophy, not elsewhere classified, right lower leg: Secondary | ICD-10-CM | POA: Diagnosis not present

## 2017-10-17 DIAGNOSIS — R2689 Other abnormalities of gait and mobility: Secondary | ICD-10-CM | POA: Diagnosis not present

## 2017-10-17 DIAGNOSIS — M62561 Muscle wasting and atrophy, not elsewhere classified, right lower leg: Secondary | ICD-10-CM | POA: Diagnosis not present

## 2017-10-17 DIAGNOSIS — I69151 Hemiplegia and hemiparesis following nontraumatic intracerebral hemorrhage affecting right dominant side: Secondary | ICD-10-CM | POA: Diagnosis not present

## 2017-10-17 DIAGNOSIS — I69314 Frontal lobe and executive function deficit following cerebral infarction: Secondary | ICD-10-CM | POA: Diagnosis not present

## 2017-10-17 DIAGNOSIS — I69320 Aphasia following cerebral infarction: Secondary | ICD-10-CM | POA: Diagnosis not present

## 2017-10-17 DIAGNOSIS — I69198 Other sequelae of nontraumatic intracerebral hemorrhage: Secondary | ICD-10-CM | POA: Diagnosis not present

## 2017-10-20 DIAGNOSIS — I69314 Frontal lobe and executive function deficit following cerebral infarction: Secondary | ICD-10-CM | POA: Diagnosis not present

## 2017-10-20 DIAGNOSIS — I69198 Other sequelae of nontraumatic intracerebral hemorrhage: Secondary | ICD-10-CM | POA: Diagnosis not present

## 2017-10-20 DIAGNOSIS — I69151 Hemiplegia and hemiparesis following nontraumatic intracerebral hemorrhage affecting right dominant side: Secondary | ICD-10-CM | POA: Diagnosis not present

## 2017-10-20 DIAGNOSIS — M62561 Muscle wasting and atrophy, not elsewhere classified, right lower leg: Secondary | ICD-10-CM | POA: Diagnosis not present

## 2017-10-20 DIAGNOSIS — R2689 Other abnormalities of gait and mobility: Secondary | ICD-10-CM | POA: Diagnosis not present

## 2017-10-20 DIAGNOSIS — I69320 Aphasia following cerebral infarction: Secondary | ICD-10-CM | POA: Diagnosis not present

## 2017-10-21 DIAGNOSIS — R2689 Other abnormalities of gait and mobility: Secondary | ICD-10-CM | POA: Diagnosis not present

## 2017-10-21 DIAGNOSIS — I69314 Frontal lobe and executive function deficit following cerebral infarction: Secondary | ICD-10-CM | POA: Diagnosis not present

## 2017-10-21 DIAGNOSIS — I69151 Hemiplegia and hemiparesis following nontraumatic intracerebral hemorrhage affecting right dominant side: Secondary | ICD-10-CM | POA: Diagnosis not present

## 2017-10-21 DIAGNOSIS — I69198 Other sequelae of nontraumatic intracerebral hemorrhage: Secondary | ICD-10-CM | POA: Diagnosis not present

## 2017-10-21 DIAGNOSIS — M62561 Muscle wasting and atrophy, not elsewhere classified, right lower leg: Secondary | ICD-10-CM | POA: Diagnosis not present

## 2017-10-21 DIAGNOSIS — I69320 Aphasia following cerebral infarction: Secondary | ICD-10-CM | POA: Diagnosis not present

## 2017-10-22 DIAGNOSIS — I69198 Other sequelae of nontraumatic intracerebral hemorrhage: Secondary | ICD-10-CM | POA: Diagnosis not present

## 2017-10-22 DIAGNOSIS — R2689 Other abnormalities of gait and mobility: Secondary | ICD-10-CM | POA: Diagnosis not present

## 2017-10-22 DIAGNOSIS — I69151 Hemiplegia and hemiparesis following nontraumatic intracerebral hemorrhage affecting right dominant side: Secondary | ICD-10-CM | POA: Diagnosis not present

## 2017-10-22 DIAGNOSIS — I69314 Frontal lobe and executive function deficit following cerebral infarction: Secondary | ICD-10-CM | POA: Diagnosis not present

## 2017-10-22 DIAGNOSIS — I69320 Aphasia following cerebral infarction: Secondary | ICD-10-CM | POA: Diagnosis not present

## 2017-10-22 DIAGNOSIS — M62561 Muscle wasting and atrophy, not elsewhere classified, right lower leg: Secondary | ICD-10-CM | POA: Diagnosis not present

## 2017-10-23 DIAGNOSIS — R2689 Other abnormalities of gait and mobility: Secondary | ICD-10-CM | POA: Diagnosis not present

## 2017-10-23 DIAGNOSIS — M62561 Muscle wasting and atrophy, not elsewhere classified, right lower leg: Secondary | ICD-10-CM | POA: Diagnosis not present

## 2017-10-23 DIAGNOSIS — I69151 Hemiplegia and hemiparesis following nontraumatic intracerebral hemorrhage affecting right dominant side: Secondary | ICD-10-CM | POA: Diagnosis not present

## 2017-10-23 DIAGNOSIS — I69198 Other sequelae of nontraumatic intracerebral hemorrhage: Secondary | ICD-10-CM | POA: Diagnosis not present

## 2017-10-23 DIAGNOSIS — I69320 Aphasia following cerebral infarction: Secondary | ICD-10-CM | POA: Diagnosis not present

## 2017-10-23 DIAGNOSIS — I69314 Frontal lobe and executive function deficit following cerebral infarction: Secondary | ICD-10-CM | POA: Diagnosis not present

## 2017-10-24 DIAGNOSIS — I69151 Hemiplegia and hemiparesis following nontraumatic intracerebral hemorrhage affecting right dominant side: Secondary | ICD-10-CM | POA: Diagnosis not present

## 2017-10-24 DIAGNOSIS — I69198 Other sequelae of nontraumatic intracerebral hemorrhage: Secondary | ICD-10-CM | POA: Diagnosis not present

## 2017-10-24 DIAGNOSIS — R2689 Other abnormalities of gait and mobility: Secondary | ICD-10-CM | POA: Diagnosis not present

## 2017-10-24 DIAGNOSIS — I69314 Frontal lobe and executive function deficit following cerebral infarction: Secondary | ICD-10-CM | POA: Diagnosis not present

## 2017-10-24 DIAGNOSIS — I69320 Aphasia following cerebral infarction: Secondary | ICD-10-CM | POA: Diagnosis not present

## 2017-10-24 DIAGNOSIS — M62561 Muscle wasting and atrophy, not elsewhere classified, right lower leg: Secondary | ICD-10-CM | POA: Diagnosis not present

## 2017-10-27 DIAGNOSIS — M62561 Muscle wasting and atrophy, not elsewhere classified, right lower leg: Secondary | ICD-10-CM | POA: Diagnosis not present

## 2017-10-27 DIAGNOSIS — I69151 Hemiplegia and hemiparesis following nontraumatic intracerebral hemorrhage affecting right dominant side: Secondary | ICD-10-CM | POA: Diagnosis not present

## 2017-10-27 DIAGNOSIS — I69314 Frontal lobe and executive function deficit following cerebral infarction: Secondary | ICD-10-CM | POA: Diagnosis not present

## 2017-10-27 DIAGNOSIS — I69320 Aphasia following cerebral infarction: Secondary | ICD-10-CM | POA: Diagnosis not present

## 2017-10-27 DIAGNOSIS — I69198 Other sequelae of nontraumatic intracerebral hemorrhage: Secondary | ICD-10-CM | POA: Diagnosis not present

## 2017-10-27 DIAGNOSIS — R2689 Other abnormalities of gait and mobility: Secondary | ICD-10-CM | POA: Diagnosis not present

## 2017-10-28 DIAGNOSIS — M62561 Muscle wasting and atrophy, not elsewhere classified, right lower leg: Secondary | ICD-10-CM | POA: Diagnosis not present

## 2017-10-28 DIAGNOSIS — I69198 Other sequelae of nontraumatic intracerebral hemorrhage: Secondary | ICD-10-CM | POA: Diagnosis not present

## 2017-10-28 DIAGNOSIS — I69151 Hemiplegia and hemiparesis following nontraumatic intracerebral hemorrhage affecting right dominant side: Secondary | ICD-10-CM | POA: Diagnosis not present

## 2017-10-28 DIAGNOSIS — I69320 Aphasia following cerebral infarction: Secondary | ICD-10-CM | POA: Diagnosis not present

## 2017-10-28 DIAGNOSIS — R2689 Other abnormalities of gait and mobility: Secondary | ICD-10-CM | POA: Diagnosis not present

## 2017-10-28 DIAGNOSIS — I69314 Frontal lobe and executive function deficit following cerebral infarction: Secondary | ICD-10-CM | POA: Diagnosis not present

## 2017-10-30 ENCOUNTER — Ambulatory Visit: Payer: Medicare Other

## 2017-10-30 DIAGNOSIS — I69151 Hemiplegia and hemiparesis following nontraumatic intracerebral hemorrhage affecting right dominant side: Secondary | ICD-10-CM | POA: Diagnosis not present

## 2017-10-30 DIAGNOSIS — I69320 Aphasia following cerebral infarction: Secondary | ICD-10-CM | POA: Diagnosis not present

## 2017-10-30 DIAGNOSIS — I69314 Frontal lobe and executive function deficit following cerebral infarction: Secondary | ICD-10-CM | POA: Diagnosis not present

## 2017-10-30 DIAGNOSIS — M62561 Muscle wasting and atrophy, not elsewhere classified, right lower leg: Secondary | ICD-10-CM | POA: Diagnosis not present

## 2017-10-30 DIAGNOSIS — I69198 Other sequelae of nontraumatic intracerebral hemorrhage: Secondary | ICD-10-CM | POA: Diagnosis not present

## 2017-10-30 DIAGNOSIS — R2689 Other abnormalities of gait and mobility: Secondary | ICD-10-CM | POA: Diagnosis not present

## 2017-11-03 DIAGNOSIS — R2689 Other abnormalities of gait and mobility: Secondary | ICD-10-CM | POA: Diagnosis not present

## 2017-11-03 DIAGNOSIS — M62561 Muscle wasting and atrophy, not elsewhere classified, right lower leg: Secondary | ICD-10-CM | POA: Diagnosis not present

## 2017-11-03 DIAGNOSIS — I69198 Other sequelae of nontraumatic intracerebral hemorrhage: Secondary | ICD-10-CM | POA: Diagnosis not present

## 2017-11-03 DIAGNOSIS — I69314 Frontal lobe and executive function deficit following cerebral infarction: Secondary | ICD-10-CM | POA: Diagnosis not present

## 2017-11-03 DIAGNOSIS — I69320 Aphasia following cerebral infarction: Secondary | ICD-10-CM | POA: Diagnosis not present

## 2017-11-03 DIAGNOSIS — I69151 Hemiplegia and hemiparesis following nontraumatic intracerebral hemorrhage affecting right dominant side: Secondary | ICD-10-CM | POA: Diagnosis not present

## 2017-11-04 DIAGNOSIS — M79672 Pain in left foot: Secondary | ICD-10-CM | POA: Diagnosis not present

## 2017-11-04 DIAGNOSIS — I69198 Other sequelae of nontraumatic intracerebral hemorrhage: Secondary | ICD-10-CM | POA: Diagnosis not present

## 2017-11-04 DIAGNOSIS — I69320 Aphasia following cerebral infarction: Secondary | ICD-10-CM | POA: Diagnosis not present

## 2017-11-04 DIAGNOSIS — Q6689 Other  specified congenital deformities of feet: Secondary | ICD-10-CM | POA: Diagnosis not present

## 2017-11-04 DIAGNOSIS — B351 Tinea unguium: Secondary | ICD-10-CM | POA: Diagnosis not present

## 2017-11-04 DIAGNOSIS — R2689 Other abnormalities of gait and mobility: Secondary | ICD-10-CM | POA: Diagnosis not present

## 2017-11-04 DIAGNOSIS — M62561 Muscle wasting and atrophy, not elsewhere classified, right lower leg: Secondary | ICD-10-CM | POA: Diagnosis not present

## 2017-11-04 DIAGNOSIS — I69151 Hemiplegia and hemiparesis following nontraumatic intracerebral hemorrhage affecting right dominant side: Secondary | ICD-10-CM | POA: Diagnosis not present

## 2017-11-04 DIAGNOSIS — I69314 Frontal lobe and executive function deficit following cerebral infarction: Secondary | ICD-10-CM | POA: Diagnosis not present

## 2017-11-04 DIAGNOSIS — M79671 Pain in right foot: Secondary | ICD-10-CM | POA: Diagnosis not present

## 2017-11-05 ENCOUNTER — Encounter: Payer: Self-pay | Admitting: Internal Medicine

## 2017-11-05 ENCOUNTER — Non-Acute Institutional Stay: Payer: Medicare Other | Admitting: Internal Medicine

## 2017-11-05 VITALS — BP 130/60 | HR 65 | Temp 97.6°F | Ht 60.0 in | Wt 150.0 lb

## 2017-11-05 DIAGNOSIS — N183 Chronic kidney disease, stage 3 unspecified: Secondary | ICD-10-CM

## 2017-11-05 DIAGNOSIS — F0151 Vascular dementia with behavioral disturbance: Secondary | ICD-10-CM

## 2017-11-05 DIAGNOSIS — I481 Persistent atrial fibrillation: Secondary | ICD-10-CM

## 2017-11-05 DIAGNOSIS — K219 Gastro-esophageal reflux disease without esophagitis: Secondary | ICD-10-CM | POA: Diagnosis not present

## 2017-11-05 DIAGNOSIS — R4701 Aphasia: Secondary | ICD-10-CM | POA: Diagnosis not present

## 2017-11-05 DIAGNOSIS — I69320 Aphasia following cerebral infarction: Secondary | ICD-10-CM | POA: Diagnosis not present

## 2017-11-05 DIAGNOSIS — E039 Hypothyroidism, unspecified: Secondary | ICD-10-CM | POA: Diagnosis not present

## 2017-11-05 DIAGNOSIS — I69198 Other sequelae of nontraumatic intracerebral hemorrhage: Secondary | ICD-10-CM | POA: Diagnosis not present

## 2017-11-05 DIAGNOSIS — I69151 Hemiplegia and hemiparesis following nontraumatic intracerebral hemorrhage affecting right dominant side: Secondary | ICD-10-CM | POA: Diagnosis not present

## 2017-11-05 DIAGNOSIS — I69314 Frontal lobe and executive function deficit following cerebral infarction: Secondary | ICD-10-CM | POA: Diagnosis not present

## 2017-11-05 DIAGNOSIS — I4819 Other persistent atrial fibrillation: Secondary | ICD-10-CM

## 2017-11-05 DIAGNOSIS — M62561 Muscle wasting and atrophy, not elsewhere classified, right lower leg: Secondary | ICD-10-CM | POA: Diagnosis not present

## 2017-11-05 DIAGNOSIS — I10 Essential (primary) hypertension: Secondary | ICD-10-CM | POA: Diagnosis not present

## 2017-11-05 DIAGNOSIS — F01518 Vascular dementia, unspecified severity, with other behavioral disturbance: Secondary | ICD-10-CM

## 2017-11-05 DIAGNOSIS — I610 Nontraumatic intracerebral hemorrhage in hemisphere, subcortical: Secondary | ICD-10-CM | POA: Diagnosis not present

## 2017-11-05 DIAGNOSIS — R2689 Other abnormalities of gait and mobility: Secondary | ICD-10-CM | POA: Diagnosis not present

## 2017-11-05 DIAGNOSIS — I5032 Chronic diastolic (congestive) heart failure: Secondary | ICD-10-CM

## 2017-11-05 NOTE — Progress Notes (Signed)
Provider:  Rexene Edison. Mariea Russell, D.O., C.M.D. Location:  Occupational psychologist of Service:  Clinic (12)  Previous PCP: Sara Curry, DO Patient Care Team: Sara Curry, DO as PCP - General (Geriatric Medicine) Sara Russell (Dentistry) [place], Sara Russell, [place] (Optometry) Sara Levans, MD (Dermatology) Sara Grayer, MD as Consulting Physician (Cardiology) Sara Curry, DO as Consulting Physician (Geriatric Medicine)  Extended Emergency Contact Information Primary Emergency Contact: Sara Russell [place] Phone: [phone] Mobile Phone: [phone] Relation: Son Secondary Emergency Contact: Sara Russell States of [place] Mobile Phone: [phone] Relation: Son  Code Status: DNR Goals of Care: Advanced Directive information Advanced Directives 11/05/2017  Does Patient Have a Medical Advance Directive? Yes  Type of Paramed[place] of [place];Out of facility DNR (pink MOST or yellow form)  Does patient want to make changes to medical advance directive? No - Patient declined  Copy of [facility] in Chart? Yes  Would patient like information on creating a medical advance directive? -  Pre-existing out of facility DNR order (yellow form or pink MOST form) Yellow form placed in chart (order not valid for inpatient use);Pink MOST form placed in chart (order not valid for inpatient use)   Chief Complaint  Patient presents with  . Establish Care    new patient    HPI: [name] seen today to establish with [facility].  She's been seen in rehab, but has decided since moving to AL enhanced in [place] from [place] after her stroke that she's going to switch to our practice so she can visit the clinic here.  Sara Russell is feeling good.  Her son reports physically, she's done ery well.  Her son lives locally.    The speech has been her biggest challenge.  She is happy in AL.   No pain.    She had a loop recorder implanted--her son is concerned she's leaving that 6 foot distance often.  She's had no symptoms. She has not been bothered by afib.    AT night, she's able to get about 6 hrs of sleep.  She is waking up to use the bathroom, but she cannot use the bahtroom.  She has historically struggled to get to sleep at night--used to go to bed at 9-10pm before and wake up at 4am.  Now going at 8pm and says sleep till noontime.  The staff check her if she's had incontinence and change her if needed.    At the end of the visit, her son, Sara Russell, who was here with her, had some questions about her loop recorder, we were uncertain of her sleep and bladder patterns, and he was unaware that she'd been put back on her blood thinner by neurology after her repeat CT showed that her hemorrhage had shrunken.    Past Medical History:  Diagnosis Date  . Abdominal mass   . AKI (acute kidney injury) ([place]) 09/15/2014  . Atrial fibrillation ([place])   . Colitis, ulcerative ([place])   . Cough    WHITE SPUTUM FOR 2 WEEKS, NO FEVER  . Degenerative joint disease   . Diverticulosis   . DVT (deep venous thrombosis) ([place]) 2009   BOTH LUNGS  . GERD (gastroesophageal reflux disease)   . Hyperlipidemia   . Hyperplastic colon polyp   . Hypertension   . Hypothyroidism   . Iron deficiency anemia    ON IRON AT TIMES  . PE (pulmonary embolism)  2009  . Persistent atrial fibrillation ([place])   . Positive H. pylori test 01/20/96  . S/P dilatation of esophageal stricture 1992  . Skin cancer    h/o basal and squamous skin cancer--> removed  . Stroke [facility])    Past Surgical History:  Procedure Laterality Date  . APPENDECTOMY    . BREAST EXCISIONAL BIOPSY Left 1992   benign  . BREAST LUMPECTOMY     benign  . DILATION AND CURETTAGE OF UTERUS     for endometrial polyps  . ENDOSCOPIC RETROGRADE CHOLANGIOPANCREATOGRAPHY (ERCP) WITH PROPOFOL N/A 03/23/2015   Procedure: ENDOSCOPIC RETROGRADE  CHOLANGIOPANCREATOGRAPHY (ERCP) WITH PROPOFOL;  Surgeon: Milus Banister, MD;  Location: [facility];  Service: Endoscopy;  Laterality: N/A;  . JOINT REPLACEMENT    . LOOP RECORDER INSERTION N/A 10/14/2017   Procedure: LOOP RECORDER INSERTION;  Surgeon: Sara Grayer, MD;  Location: [place] [facility];  Service: Cardiovascular;  Laterality: N/A;  . ORIF ANKLE FRACTURE Left    X 2  . REPLACEMENT TOTAL KNEE     left  . SURGERY DONE TO REMOVE LEFT ANKLE HARDWARE LATER    . TONSILLECTOMY      Social History   Socioeconomic History  . Marital status: Widowed    Spouse name: Not on file  . Number of children: 3  . Years of education: college  . Highest education level: Bachelor's degree (e.g., BA, AB, BS)  Occupational History  . Occupation: retired Optician, dispensing: RETIRED  Social Needs  . Financial resource strain: Not on file  . Food insecurity:    Worry: Never true    Inability: Not on file  . Transportation needs:    Medical: No    Non-medical: No  Tobacco Use  . Smoking status: Former Smoker    Packs/day: 0.50    Years: 13.00    Pack years: 6.50    Types: Cigarettes    Start date: 02/12/1948    Last attempt to quit: 02/11/1961    Years since quitting: 56.7  . Smokeless tobacco: Never Used  . Tobacco comment: Denies use of tobacco since 1963  Substance and Sexual Activity  . Alcohol use: Yes    Alcohol/week: 5.0 standard drinks    Types: 5 Glasses of wine per week    Comment: WINE FEW TIMES PER WEEK  . Drug use: No  . Sexual activity: Never  Lifestyle  . Physical activity:    Days per week: Not on file    Minutes per session: Not on file  . Stress: Not on file  Relationships  . Social connections:    Talks on phone: Not on file    Gets together: Not on file    Attends religious service: Not on file    Active member of club or organization: Not on file    Attends meetings of clubs or organizations: Not on file    Relationship status: Not on file  Other Topics  Concern  . Not on file  Social History Narrative   Health Care POA:    Emergency Contact: son, Sara Russell (c) [phone] Sara Russell)   Diet: Pt has a varied diet of protein, starch, vegetables.   Exercise: Pt has no regular exercise plan because of pain in back.   Seatbelts: Pt reports wearing seatbelt when in vehicle.   Nancy Fetter Exposure/Protection: Pt reports wearing sun screen.   Hobbies: reading, traveling, eating out.      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 Ducatte, RN, BSN                                                                                                     reports that she quit smoking about 56 years ago. Her smoking use included cigarettes. She started smoking about 69 years ago. She has a 6.50 pack-year smoking history. She has never used smokeless tobacco. She reports that she drinks about 5.0 standard drinks of alcohol per week. She reports that she does not use drugs.  Functional Status Survey:    Family History  Problem Relation Age of Onset  . Esophageal cancer Brother   . Diverticulitis Brother   . Heart  disease Mother   . Stroke Mother   . Dementia Mother   . Heart attack Mother   . Heart disease Father   . Atrial fibrillation Father   . Breast cancer Maternal Aunt   . Colon cancer Neg Hx     Health Maintenance  Topic Date Due  . INFLUENZA VACCINE  09/11/2017  . TETANUS/TDAP  04/14/2018  . DEXA SCAN  Completed  . PNA vac Low Risk Adult  Completed    Allergies  Allergen Reactions  . Iohexol Hives  . Ivp Dye [Iodinated Diagnostic Agents] Hives    Was able to tolerate Contrast Media for CT scan.  [name] [Shellfish Allergy] Nausea And Vomiting    Projectile vomiting  . Shellfish-Derived Products Nausea And Vomiting  . Sulfa Antibiotics Hives  . Sulfamethoxazole Hives    Outpatient Encounter Medications as of 11/05/2017  Medication Sig  . acetaminophen (TYLENOL ARTHRITIS PAIN) 650 MG CR tablet Take 650 mg by mouth every 8 (eight) hours as needed for pain.   Marland Kitchen amiodarone (PACERONE) 100 MG tablet Take 1 tablet (100 mg total) by mouth daily.  Marland Kitchen apixaban (ELIQUIS) 2.5 MG TABS tablet Take 2.5 mg by mouth 2 (two) times daily.  Marland Kitchen atorvastatin (LIPITOR) 10 MG tablet Take 1 tablet (10 mg total) by mouth daily.  . bethanechol (URECHOLINE) 10 MG tablet Take 1 tablet (10 mg total) by mouth 3 (three) times daily.  . carvedilol (COREG) 6.25 MG  tablet Take 3 tablets (18.75 mg total) by mouth 2 (two) times daily with a meal.  . hydroxypropyl methylcellulose / hypromellose (ISOPTO TEARS / GONIOVISC) 2.5 % ophthalmic solution Place 1 drop into both eyes as needed for dry eyes.  Marland Kitchen levothyroxine (SYNTHROID, LEVOTHROID) 75 MCG tablet Take 1 tablet (75 mcg total) by mouth daily.  . pantoprazole (PROTONIX) 40 MG tablet Take 1 tablet (40 mg total) by mouth daily.  . potassium chloride 20 MEQ/15ML (10%) SOLN Take 7.5 mLs (10 mEq total) by mouth daily.  Marland Kitchen senna-docusate (SENOKOT-S) 8.6-50 MG tablet Take 1 tablet by mouth 2 (two) times daily.  . Simethicone (GAS RELIEF PO) Take 1 tablet by mouth 2  (two) times daily.  Marland Kitchen tiZANidine (ZANAFLEX) 2 MG tablet Take 1 tablet (2 mg total) by mouth 4 (four) times daily -  with meals and at bedtime.  . [DISCONTINUED] feeding supplement, ENSURE ENLIVE, (ENSURE ENLIVE) LIQD Take 237 mLs by mouth 2 (two) times daily between meals. (Patient taking differently: Take 237 mLs by mouth daily after supper. )   No facility-administered encounter medications on file as of 11/05/2017.     Review of Systems  Constitutional: Positive for malaise/fatigue. Negative for chills and fever.  HENT: Negative for congestion.   Eyes: Negative for blurred vision.       Had a field cut initially but seems to have resolved along with her weakness; only aphasia remains  Respiratory: Negative for cough and shortness of breath.   Cardiovascular: Negative for chest pain, palpitations and leg swelling.  Gastrointestinal: Negative for abdominal pain, blood in stool, constipation, diarrhea and melena.  Genitourinary: Negative for dysuria.  Musculoskeletal: Negative for falls and joint pain.  Skin: Negative for itching and rash.  Neurological: Negative for dizziness and weakness.  Psychiatric/Behavioral: Positive for memory loss.       Possibly some cognitive impairment--hard to ascertain how much is aphasia vs forgetfulness with some history    Vitals:   11/05/17 1540  BP: 130/60  Pulse: 65  Temp: 97.6 F (36.4 C)  TempSrc: Oral  SpO2: 96%  Weight: 150 lb (68 kg)  Height: 5' (1.524 m)   Body mass index is 29.29 kg/m. Physical Exam  Constitutional: She is oriented to person, place, and time. She appears well-developed and well-nourished. No distress.  HENT:  Head: Normocephalic and atraumatic.  Right Ear: External ear normal.  Left Ear: External ear normal.  Nose: Nose normal.  Mouth/Throat: Oropharynx is clear and moist.  Eyes: Pupils are equal, round, and reactive to light. Conjunctivae and EOM are normal.  Reading glasses  Neck: Neck supple. No JVD present.  No tracheal deviation present. No thyromegaly present.  Cardiovascular: Intact distal pulses.  irreg irreg  Pulmonary/Chest: Effort normal and breath sounds normal. No respiratory distress.  Abdominal: Soft. Bowel sounds are normal. She exhibits no distension and no mass. There is no tenderness. There is no guarding.  Musculoskeletal: Normal range of motion.  Ambulates well, appears steady  Lymphadenopathy:    She has no cervical adenopathy.  Neurological: She is alert and oriented to person, place, and time.  Knows her way around facility, aphasia remains an issue when coming up with a word, providing history in terms of time seems challenging  Skin: Skin is warm and dry.  Psychiatric: She has a normal mood and affect. Her behavior is normal. Judgment and thought content normal.    Labs reviewed: Basic Metabolic Panel: Recent Labs    08/15/17 0652 08/25/17 0802 08/27/17  7824 08/29/17 0640  NA 135 137 138 137  K 4.3 4.1 4.4 3.8  CL 102 102 107  --   CO2 25 26 23   --   GLUCOSE 102* 110* 97  --   BUN 26* 44* 51* 39*  CREATININE 1.30* 1.34* 1.29* 1.2*  CALCIUM 8.8* 9.1 8.6*  --    Liver Function Tests: Recent Labs    01/31/17 1042 08/02/17 1907 08/07/17 0547  AST 22 21 17   ALT 14 13* 12  ALKPHOS 81 74 71  BILITOT 0.8 0.6 0.7  PROT 6.7 6.4* 5.7*  ALBUMIN 3.8 3.7 2.8*   No results for input(s): LIPASE, AMYLASE in the last 8760 hours. No results for input(s): AMMONIA in the last 8760 hours. CBC: Recent Labs    01/31/17 1042 08/02/17 1907  08/07/17 0547 08/15/17 0652 08/29/17 0640  WBC 9.4 7.1  --  9.7 8.2 7.1  NEUTROABS 7.8* 4.9  --  8.0*  --   --   HGB 11.6* 11.9*   < > 11.2* 11.4* 11.6*  HCT 34.1* 36.6   < > 33.1* 35.3* 34*  MCV 93.9 95.8  --  92.5 93.4  --   PLT 250 204  --  188 301 292   < > = values in this interval not displayed.   Cardiac Enzymes: Recent Labs    08/26/17 1151  TROPONINI <0.03   BNP: Invalid input(s): POCBNP Lab Results    Component Value Date   HGBA1C 5.3 08/02/2013   Lab Results  Component Value Date   TSH 2.72 08/29/2017   Lab Results  Component Value Date   VITAMINB12 563 07/06/2009   Lab Results  Component Value Date   FOLATE  07/06/2009    >20.0 (NOTE)  Reference Ranges        Deficient:       0.4 - 3.3 ng/mL        Indeterminate:   3.4 - 5.4 ng/mL        Normal:              > 5.4 ng/mL   Lab Results  Component Value Date   IRON 42 09/22/2009   TIBC 281 09/22/2009   FERRITIN 49 06/26/2012     Assessment/Plan 1. Thalamic hemorrhage ([place]) -symptoms resolved except some possible cogntive impairment and aphasia -still working with speech therapy  2. Expressive aphasia -ongoing, as above  3. Persistent atrial fibrillation -has loop recorder, nursing checking if it's in the right position to get best information  4. Chronic diastolic congestive heart failure (HCC) -cont same regimen, no signs of exacerbation  5. Essential hypertension -bp well controlled with current regimen, cont same and monitor  6. Vascular dementia with behavior disturbance ([place]) -seems she has some mild dementia from her stroke--history not accurate, but difficult to tell how much is due to her aphasia/communication vs actual remembering at this point, pt gets appropriately frustrated, cont ST  7. Stage 3 chronic kidney disease ([place]) -f/u renal function before next visit, Avoid nephrotoxic agents like nsaids, dose adjust renally excreted meds, hydrate.  8. Hypothyroidism, unspecified type -cont current levothyroxine and monitor  9. Gastroesophageal reflux disease without esophagitis -no complaints of symptoms, cont current regimen  Labs/tests ordered:  F/u labs on it before next visit 03/11/2018 med mgt Need to discuss shingrix vaccine with son  Kalim Kissel L. Haven Pylant, D.O. [facility] [address],  [address] Cell Phone (Mon-Fri 8am-5pm):  [phone] On Call:  [phone] & follow prompts after 5pm & weekends Office Phone:  [phone] Office Fax:  [phone]

## 2017-11-06 ENCOUNTER — Encounter: Payer: Self-pay | Admitting: Internal Medicine

## 2017-11-06 DIAGNOSIS — I69320 Aphasia following cerebral infarction: Secondary | ICD-10-CM | POA: Diagnosis not present

## 2017-11-06 DIAGNOSIS — S06360S Traumatic hemorrhage of cerebrum, unspecified, without loss of consciousness, sequela: Secondary | ICD-10-CM | POA: Diagnosis not present

## 2017-11-10 DIAGNOSIS — S06360S Traumatic hemorrhage of cerebrum, unspecified, without loss of consciousness, sequela: Secondary | ICD-10-CM | POA: Diagnosis not present

## 2017-11-10 DIAGNOSIS — I69320 Aphasia following cerebral infarction: Secondary | ICD-10-CM | POA: Diagnosis not present

## 2017-11-11 ENCOUNTER — Telehealth: Payer: Self-pay | Admitting: Internal Medicine

## 2017-11-11 ENCOUNTER — Telehealth: Payer: Self-pay | Admitting: Physician Assistant

## 2017-11-11 NOTE — Telephone Encounter (Signed)
New Message:    Nona Dell with [facility] stated  there was a form faxed for the clarification of : apixaban (ELIQUIS) 2.5 MG TABS tablet   Contact [phone]

## 2017-11-11 NOTE — Telephone Encounter (Signed)
I haven't seen a form  Chanetta Marshall, NP 11/11/2017 11:56 AM

## 2017-11-11 NOTE — Telephone Encounter (Signed)
Returned call to son (DPR on file). He states that the patient was restarted on Eliquis 10/07/17. Made son aware that the patient's Eliquis was restarted by neuro. Son wanting to know if the Eliquis is going to interfere with the Loop Recorder Readings. Made him aware that the ILR was placed to assess Afib burden and being on Eliquis will not change that, but help prevent blood clots/stroke. Made him aware that it looked like they were looking to see what the Afib burden was to help guide decision on [place]. Son states that the patient is not having any S/Sx of bleeding. Made son aware that I will forward to Chanetta Marshall, NP to make her aware that the patient has restarted the Eliquis. Instructed for the patient to keep appointment scheduled with Dr. [name] on 10/14 and the management of her Afib will be discussed further. Made son aware that if there were additional recommendations then they would be contacted.

## 2017-11-11 NOTE — Telephone Encounter (Signed)
New message  Pt c/o medication issue:  1. Name of Medication: apixaban (ELIQUIS) 2.5 MG TABS tablet  2. How are you currently taking this medication (dosage and times per day)? Two times daily  3. Are you having a reaction (difficulty breathing--STAT)? No  4. What is your medication issue? Patient's son wants to know if aware that she is back on Eliquis? Would this medication have any adverse impact on the loop recorder readings?

## 2017-11-11 NOTE — Telephone Encounter (Signed)
Returned call to [facility] with [place].  Advised that this nurse could not find a faxed form.  Advised to refax form attn [facility].  Fax # given.  Advised current med list has Eliquis on it, but not prescribed by this office.    Nona Dell indicates understanding and will refax form

## 2017-11-12 ENCOUNTER — Encounter: Payer: Self-pay | Admitting: Internal Medicine

## 2017-11-12 DIAGNOSIS — S06360S Traumatic hemorrhage of cerebrum, unspecified, without loss of consciousness, sequela: Secondary | ICD-10-CM | POA: Diagnosis not present

## 2017-11-12 DIAGNOSIS — I69320 Aphasia following cerebral infarction: Secondary | ICD-10-CM | POA: Diagnosis not present

## 2017-11-13 DIAGNOSIS — S06360S Traumatic hemorrhage of cerebrum, unspecified, without loss of consciousness, sequela: Secondary | ICD-10-CM | POA: Diagnosis not present

## 2017-11-13 DIAGNOSIS — I69320 Aphasia following cerebral infarction: Secondary | ICD-10-CM | POA: Diagnosis not present

## 2017-11-14 ENCOUNTER — Telehealth: Payer: Self-pay | Admitting: Internal Medicine

## 2017-11-14 NOTE — Telephone Encounter (Signed)
  Pt c/o medication issue: 1. Name of Medication: apixaban (ELIQUIS) 2.5 MG TABS tablet  2. How are you currently taking this medication (dosage and times per day)?  Take 2.5 mg by mouth 2 (two) times daily.  3. Are you having a reaction (difficulty breathing--STAT)?  Na  4. What is your medication issue?  RN from [employer] wants clarification regarding Eliquis being started after the loop recorder was placed.

## 2017-11-14 NOTE — Telephone Encounter (Signed)
Returned call to [place] at [employer]. She states that she would like to clarify whether or not the patient was supposed to restart the Eliquis after the Loop Recorder was placed. Made Helene Kelp aware that the patient's Eliquis was restarted by neuro. Made her aware that the ILR was placed to assess Afib burden and help guide decision on oral anticoagulation. Made her aware that since neuro has already made the decision to restart Eliquis that she would need to follow-up with them regarding orders. Made her aware that it will not interfere with the ILR and assessing Afib burden. Made her aware that the information would be forwarded for review.

## 2017-11-17 ENCOUNTER — Ambulatory Visit (INDEPENDENT_AMBULATORY_CARE_PROVIDER_SITE_OTHER): Payer: Medicare Other | Admitting: *Deleted

## 2017-11-17 DIAGNOSIS — I639 Cerebral infarction, unspecified: Secondary | ICD-10-CM

## 2017-11-17 DIAGNOSIS — I4819 Other persistent atrial fibrillation: Secondary | ICD-10-CM | POA: Diagnosis not present

## 2017-11-17 NOTE — Progress Notes (Signed)
Carelink Summary Report / Loop Recorder 

## 2017-11-17 NOTE — Telephone Encounter (Signed)
Rn refax order form back to [facility] at [phone]. Sara Billow NP wrote on order form that no order needs to be change. Pt needs to continue eliquis for secondary stroke prevention. She recommend pt follow up with cardiology next week for continue anticoagulant monitoring.

## 2017-11-24 ENCOUNTER — Ambulatory Visit (INDEPENDENT_AMBULATORY_CARE_PROVIDER_SITE_OTHER): Payer: Medicare Other | Admitting: Internal Medicine

## 2017-11-24 ENCOUNTER — Encounter: Payer: Self-pay | Admitting: Internal Medicine

## 2017-11-24 VITALS — BP 126/68 | HR 64 | Ht 60.0 in | Wt 148.0 lb

## 2017-11-24 DIAGNOSIS — I4819 Other persistent atrial fibrillation: Secondary | ICD-10-CM | POA: Diagnosis not present

## 2017-11-24 DIAGNOSIS — I639 Cerebral infarction, unspecified: Secondary | ICD-10-CM | POA: Diagnosis not present

## 2017-11-24 LAB — CUP PACEART INCLINIC DEVICE CHECK
Date Time Interrogation Session: 20191014173054
Implantable Pulse Generator Implant Date: 20190903

## 2017-11-24 NOTE — Patient Instructions (Addendum)
Medication Instructions:  Your physician has recommended you make the following change in your medication:   1.  Stop taking Eliquis   Labwork: None ordered.  Testing/Procedures: None ordered.  Follow-Up: Your physician wants you to follow-up in: 6 months with Chanetta Marshall, NP.   You will receive a reminder letter in the mail two months in advance. If you don't receive a letter, please call our office to schedule the follow-up appointment.  You will have monthly monitor checks for your loop recorder.  Any Other Special Instructions Will Be Listed Below (If Applicable).  If you need a refill on your cardiac medications before your next appointment, please call your pharmacy.

## 2017-11-24 NOTE — Progress Notes (Signed)
PCP: [name], DO   Primary EP: Dr Coral Ceo ELLYN RUBIANO is a 84 y.o. female who presents today for routine electrophysiology followup.  Since last being seen in our clinic, the patient reports doing very well.  Today, she denies symptoms of palpitations, chest pain, shortness of breath,  lower extremity edema, dizziness, presyncope, or syncope.  The patient is otherwise without complaint today.   Past Medical History:  Diagnosis Date  . Abdominal mass   . AKI (acute kidney injury) ([place]) 09/15/2014  . Atrial fibrillation ([place])   . Colitis, ulcerative ([place])   . Cough    WHITE SPUTUM FOR 2 WEEKS, NO FEVER  . Degenerative joint disease   . Diverticulosis   . DVT (deep venous thrombosis) ([place]) 2009   BOTH LUNGS  . GERD (gastroesophageal reflux disease)   . Hyperlipidemia   . Hyperplastic colon polyp   . Hypertension   . Hypothyroidism   . Iron deficiency anemia    ON IRON AT TIMES  . PE (pulmonary embolism) 2009  . Persistent atrial fibrillation   . Positive H. pylori test 01/20/96  . S/P dilatation of esophageal stricture 1992  . Skin cancer    h/o basal and squamous skin cancer--> removed  . Stroke [facility])    Past Surgical History:  Procedure Laterality Date  . APPENDECTOMY    . BREAST EXCISIONAL BIOPSY Left 1992   benign  . BREAST LUMPECTOMY     benign  . DILATION AND CURETTAGE OF UTERUS     for endometrial polyps  . ENDOSCOPIC RETROGRADE CHOLANGIOPANCREATOGRAPHY (ERCP) WITH PROPOFOL N/A 03/23/2015   Procedure: ENDOSCOPIC RETROGRADE CHOLANGIOPANCREATOGRAPHY (ERCP) WITH PROPOFOL;  Surgeon: Milus Banister, MD;  [facility]: [facility];  Service: Endoscopy;  Laterality: N/A;  . JOINT REPLACEMENT    . LOOP RECORDER INSERTION N/A 10/14/2017   Procedure: LOOP RECORDER INSERTION;  Surgeon: Thompson Grayer, MD;  Location: [place] CV LAB;  Service: Cardiovascular;  Laterality: N/A;  . ORIF ANKLE FRACTURE Left    X 2  . REPLACEMENT TOTAL KNEE     left  . SURGERY DONE  TO REMOVE LEFT ANKLE HARDWARE LATER    . TONSILLECTOMY      ROS- all systems are reviewed and negatives except as per HPI above  Current Outpatient Medications  Medication Sig Dispense Refill  . acetaminophen (TYLENOL ARTHRITIS PAIN) 650 MG CR tablet Take 650 mg by mouth every 8 (eight) hours as needed for pain.     Marland Kitchen amiodarone (PACERONE) 100 MG tablet Take 1 tablet (100 mg total) by mouth daily. 90 tablet 3  . apixaban (ELIQUIS) 2.5 MG TABS tablet Take 2.5 mg by mouth 2 (two) times daily.    Marland Kitchen atorvastatin (LIPITOR) 10 MG tablet Take 1 tablet (10 mg total) by mouth daily. 90 tablet 3  . bethanechol (URECHOLINE) 10 MG tablet Take 1 tablet (10 mg total) by mouth 3 (three) times daily.    . carvedilol (COREG) 6.25 MG tablet Take 3 tablets (18.75 mg total) by mouth 2 (two) times daily with a meal.    . hydroxypropyl methylcellulose / hypromellose (ISOPTO TEARS / GONIOVISC) 2.5 % ophthalmic solution Place 1 drop into both eyes as needed for dry eyes. 15 mL 12  . levothyroxine (SYNTHROID, LEVOTHROID) 75 MCG tablet Take 1 tablet (75 mcg total) by mouth daily. 90 tablet 3  . pantoprazole (PROTONIX) 40 MG tablet Take 1 tablet (40 mg total) by mouth daily.    . potassium chloride  20 MEQ/15ML (10%) SOLN Take 7.5 mLs (10 mEq total) by mouth daily. 450 mL 0  . senna-docusate (SENOKOT-S) 8.6-50 MG tablet Take 1 tablet by mouth 2 (two) times daily.    . Simethicone (GAS RELIEF PO) Take 1 tablet by mouth 2 (two) times daily.    Marland Kitchen tiZANidine (ZANAFLEX) 2 MG tablet Take 1 tablet (2 mg total) by mouth 4 (four) times daily -  with meals and at bedtime. 30 tablet 0   No current facility-administered medications for this visit.     Physical Exam: Vitals:   11/24/17 1539  BP: 126/68  Pulse: 64  SpO2: 97%  Weight: 148 lb (67.1 kg)  Height: 5' (1.524 m)    GEN- The patient is well appearing, alert and oriented x 3 today.   Head- normocephalic, atraumatic Eyes-  Sclera clear, conjunctiva pink Ears-  hearing intact Oropharynx- clear Lungs- Clear to ausculation bilaterally, normal work of breathing Heart- Regular rate and rhythm, no murmurs, rubs or gallops, PMI not laterally displaced GI- soft, NT, ND, + BS Extremities- no clubbing, cyanosis, or edema  Wt Readings from Last 3 Encounters:  11/24/17 148 lb (67.1 kg)  11/05/17 150 lb (68 kg)  10/09/17 148 lb 6.4 oz (67.3 kg)    EKG tracing ordered today is personally reviewed and shows sinus rhythm 64 bpm, PR 254 msec, QRS 84 msec, QTc 466 msec, LAD  Assessment and Plan:  1. Paroxysmal atrial fibrillation No afib on monitoring with ILR since 10/14/17 I had a long discussion with patient and her son today. They would like to stop eliquis at this time given prior ICH.  If afib is detected on ILR, they will reconsider anticoagulation at that time. Our office will follow closely with ILR. [facility] to follow LFTs, TFTS twice per year  [facility]  Return to see EP NP in 6 months   Thompson Grayer MD, [facility] 11/24/2017 4:06 PM

## 2017-12-01 LAB — CUP PACEART REMOTE DEVICE CHECK
Date Time Interrogation Session: 20191006173950
Implantable Pulse Generator Implant Date: 20190903

## 2017-12-11 DIAGNOSIS — Z23 Encounter for immunization: Secondary | ICD-10-CM | POA: Diagnosis not present

## 2017-12-15 ENCOUNTER — Encounter: Payer: Self-pay | Admitting: Adult Health

## 2017-12-15 ENCOUNTER — Non-Acute Institutional Stay: Payer: Medicare Other | Admitting: Adult Health

## 2017-12-15 DIAGNOSIS — K219 Gastro-esophageal reflux disease without esophagitis: Secondary | ICD-10-CM

## 2017-12-15 DIAGNOSIS — R339 Retention of urine, unspecified: Secondary | ICD-10-CM

## 2017-12-15 DIAGNOSIS — R197 Diarrhea, unspecified: Secondary | ICD-10-CM

## 2017-12-15 NOTE — Progress Notes (Signed)
Location:  Occupational psychologist of Service:  SNF (31) Provider:   Cindi Carbon, ANP [place] [phone]   [name], DO  Patient Care Team: [name], DO as PCP - General (Geriatric Medicine) Benard Rink (Dentistry) [place], [place], [place] (Optometry) Sydnee Levans, MD (Dermatology) Thompson Grayer, MD as Consulting Physician (Cardiology) [name], DO as Consulting Physician (Geriatric Medicine)  Extended Emergency Contact Information Primary Emergency Contact: [name] [place] Phone: [phone] Mobile Phone: [phone] Relation: Son Secondary Emergency Contact: [name] [place] Mobile Phone: [phone] Relation: Son  Code Status:  DNR Goals of care: Advanced Directive information Advanced Directives 11/05/2017  Does Patient Have a Medical Advance Directive? Yes  Type of Paramedic of [place];Out of facility DNR (pink MOST or yellow form)  Does patient want to make changes to medical advance directive? No - Patient declined  Copy of [place] in Chart? Yes  Would patient like information on creating a medical advance directive? -  Pre-existing out of facility DNR order (yellow form or pink MOST form) Yellow form placed in chart (order not valid for inpatient use);Pink MOST form placed in chart (order not valid for inpatient use)     Chief Complaint  Patient presents with  . Acute Visit    diarrhea    HPI:  Pt is a 84 y.o. female seen today for an acute visit for diarrhea. Staff report the resident is having loose stools for 1 weeks up to 6-7 per day that are watery with no mucus, blood, abd pain, or fever. She continues to eat well and VS are stable. She denies any indigestion or chest burning. Denies any difficulty urinating. She is on urecholine for urinary retention which was started after her stroke earlier in the  year. Oncall [place] provider started bentyl for diarrhea on 11/3 with no improvement thus far. Stools studies were ordered but are pending. In review of epic in 2012 she had diarrhea with a similar presentation and had a colon bx and was diagnosed with lymphocytic colitis which was treated with Entocort with improvement. Epic also indicates a hx of ulcerative colitis but I could not confirm this dx. Of note she has lost 7 lbs in the past two months.    Past Medical History:  Diagnosis Date  . Abdominal mass   . AKI (acute kidney injury) ([place]) 09/15/2014  . Atrial fibrillation ([place])   . Colitis, ulcerative ([place])   . Cough    WHITE SPUTUM FOR 2 WEEKS, NO FEVER  . Degenerative joint disease   . Diverticulosis   . DVT (deep venous thrombosis) ([place]) 2009   BOTH LUNGS  . GERD (gastroesophageal reflux disease)   . Hyperlipidemia   . Hyperplastic colon polyp   . Hypertension   . Hypothyroidism   . Iron deficiency anemia    ON IRON AT TIMES  . PE (pulmonary embolism) 2009  . Persistent atrial fibrillation   . Positive H. pylori test 01/20/96  . S/P dilatation of esophageal stricture 1992  . Skin cancer    h/o basal and squamous skin cancer--> removed  . Stroke [facility])    Past Surgical History:  Procedure Laterality Date  . APPENDECTOMY    . BREAST EXCISIONAL BIOPSY Left 1992   benign  . BREAST LUMPECTOMY     benign  . DILATION AND CURETTAGE OF UTERUS     for endometrial polyps  .  ENDOSCOPIC RETROGRADE CHOLANGIOPANCREATOGRAPHY (ERCP) WITH PROPOFOL N/A 03/23/2015   Procedure: ENDOSCOPIC RETROGRADE CHOLANGIOPANCREATOGRAPHY (ERCP) WITH PROPOFOL;  Surgeon: Milus Banister, MD;  [facility];  Service: Endoscopy;  Laterality: N/A;  . JOINT REPLACEMENT    . LOOP RECORDER INSERTION N/A 10/14/2017   Procedure: LOOP RECORDER INSERTION;  Surgeon: Thompson Grayer, MD;  Location: [place] CV LAB;  Service: Cardiovascular;  Laterality: N/A;  . ORIF ANKLE FRACTURE Left    X 2  . REPLACEMENT  TOTAL KNEE     left  . SURGERY DONE TO REMOVE LEFT ANKLE HARDWARE LATER    . TONSILLECTOMY      Allergies  Allergen Reactions  . Iohexol Hives  . Ivp Dye [Iodinated Diagnostic Agents] Hives    Was able to tolerate Contrast Media for CT scan.  [name] [Shellfish Allergy] Nausea And Vomiting    Projectile vomiting  . Shellfish-Derived Products Nausea And Vomiting  . Sulfa Antibiotics Hives  . Sulfamethoxazole Hives    Outpatient Encounter Medications as of 12/15/2017  Medication Sig  . acetaminophen (TYLENOL ARTHRITIS PAIN) 650 MG CR tablet Take 650 mg by mouth every 8 (eight) hours as needed for pain.   Marland Kitchen amiodarone (PACERONE) 100 MG tablet Take 1 tablet (100 mg total) by mouth daily.  Marland Kitchen atorvastatin (LIPITOR) 10 MG tablet Take 1 tablet (10 mg total) by mouth daily.  . bethanechol (URECHOLINE) 10 MG tablet Take 1 tablet (10 mg total) by mouth 3 (three) times daily.  . carvedilol (COREG) 6.25 MG tablet Take 3 tablets (18.75 mg total) by mouth 2 (two) times daily with a meal.  . dicyclomine (BENTYL) 20 MG tablet Take 20 mg by mouth 3 (three) times daily before meals.  Marland Kitchen levothyroxine (SYNTHROID, LEVOTHROID) 75 MCG tablet Take 1 tablet (75 mcg total) by mouth daily.  . pantoprazole (PROTONIX) 40 MG tablet Take 1 tablet (40 mg total) by mouth daily.  . potassium chloride 20 MEQ/15ML (10%) SOLN Take 7.5 mLs (10 mEq total) by mouth daily.  Marland Kitchen senna-docusate (SENOKOT-S) 8.6-50 MG tablet Take 1 tablet by mouth 2 (two) times daily. (Patient taking differently: Take 1 tablet by mouth 2 (two) times daily as needed. )  . Simethicone (GAS RELIEF PO) Take 1 tablet by mouth 2 (two) times daily.  Marland Kitchen tiZANidine (ZANAFLEX) 2 MG tablet Take 1 tablet (2 mg total) by mouth 4 (four) times daily -  with meals and at bedtime.  . hydroxypropyl methylcellulose / hypromellose (ISOPTO TEARS / GONIOVISC) 2.5 % ophthalmic solution Place 1 drop into both eyes as needed for dry eyes.   No facility-administered  encounter medications on file as of 12/15/2017.     Review of Systems  Constitutional: Negative for activity change, appetite change, chills, diaphoresis, fatigue, fever and unexpected weight change.  HENT: Negative for congestion.   Respiratory: Negative for cough, shortness of breath and wheezing.   Cardiovascular: Negative for chest pain, palpitations and leg swelling.  Gastrointestinal: Positive for diarrhea. Negative for abdominal distention, abdominal pain, anal bleeding, blood in stool, constipation, nausea, rectal pain and vomiting.  Genitourinary: Negative for decreased urine volume, difficulty urinating, dysuria, flank pain, frequency, hematuria and urgency.  Musculoskeletal: Positive for gait problem. Negative for arthralgias, back pain, joint swelling and myalgias.  Neurological: Positive for speech difficulty. Negative for dizziness, tremors, syncope, weakness and light-headedness.  Psychiatric/Behavioral: Positive for confusion. Negative for agitation and behavioral problems.    Immunization History  Administered Date(s) Administered  . H1N1 03/04/2008  . Influenza Split 11/12/2010,  11/26/2011  . Influenza Whole 11/26/2006, 11/03/2007, 12/10/2007, 11/16/2008, 11/20/2009  . Influenza,inj,Quad PF,6+ Mos 11/24/2012, 11/29/2013, 10/14/2014, 11/28/2015, 12/04/2016  . PPD Test 10/03/2014  . Pneumococcal Conjugate-13 09/10/2013  . Pneumococcal Polysaccharide-23 02/12/2000  . Td 02/11/1998, 04/13/2008  . Zoster 05/08/2006   Pertinent  Health Maintenance Due  Topic Date Due  . INFLUENZA VACCINE  09/11/2017  . DEXA SCAN  Completed  . PNA vac Low Risk Adult  Completed   Fall Risk  11/05/2017 09/24/2017 02/12/2017 01/27/2017 12/19/2016  Falls in the past year? No No No No No  Number falls in past yr: - - - - -  Comment - - - - -   Functional Status Survey:    Vitals:   12/15/17 1335  BP: 135/89  Pulse: (!) 59  Resp: 20  Temp: (!) 97.4 F (36.3 C)  SpO2: 98%  Weight: 143  lb 3.2 oz (65 kg)   Body mass index is 27.97 kg/m.  Wt Readings from Last 3 Encounters:  12/15/17 143 lb 3.2 oz (65 kg)  11/24/17 148 lb (67.1 kg)  11/05/17 150 lb (68 kg)   Physical Exam  Constitutional: She is oriented to person, place, and time.  Cardiovascular: Normal rate and regular rhythm.  No murmur heard. Pulmonary/Chest: Effort normal and breath sounds normal. No stridor. No respiratory distress.  Abdominal: Soft. Bowel sounds are normal. She exhibits no distension. There is no tenderness.  Genitourinary: Rectum normal. Rectal exam shows no external hemorrhoid, no internal hemorrhoid, no fissure, no mass, no tenderness and anal tone normal.  Neurological: She is alert and oriented to person, place, and time.  Skin: Skin is warm and dry.  Psychiatric: She has a normal mood and affect.  Nursing note and vitals reviewed.   Labs reviewed: Recent Labs    08/15/17 0652 08/25/17 0802 08/27/17 0632 08/29/17 0640  NA 135 137 138 137  K 4.3 4.1 4.4 3.8  CL 102 102 107  --   CO2 25 26 23   --   GLUCOSE 102* 110* 97  --   BUN 26* 44* 51* 39*  CREATININE 1.30* 1.34* 1.29* 1.2*  CALCIUM 8.8* 9.1 8.6*  --    Recent Labs    01/31/17 1042 08/02/17 1907 08/07/17 0547  AST 22 21 17   ALT 14 13* 12  ALKPHOS 81 74 71  BILITOT 0.8 0.6 0.7  PROT 6.7 6.4* 5.7*  ALBUMIN 3.8 3.7 2.8*   Recent Labs    01/31/17 1042 08/02/17 1907  08/07/17 0547 08/15/17 0652 08/29/17 0640  WBC 9.4 7.1  --  9.7 8.2 7.1  NEUTROABS 7.8* 4.9  --  8.0*  --   --   HGB 11.6* 11.9*   < > 11.2* 11.4* 11.6*  HCT 34.1* 36.6   < > 33.1* 35.3* 34*  MCV 93.9 95.8  --  92.5 93.4  --   PLT 250 204  --  188 301 292   < > = values in this interval not displayed.   Lab Results  Component Value Date   TSH 2.72 08/29/2017   Lab Results  Component Value Date   HGBA1C 5.3 08/02/2013   Lab Results  Component Value Date   CHOL 89 09/16/2014   HDL 13 (L) 09/16/2014   LDLCALC 40 09/16/2014   LDLDIRECT  85 08/18/2008   TRIG 76 08/05/2017   CHOLHDL 6.8 09/16/2014    Significant Diagnostic Results in last 30 days:  No results found.  Assessment/Plan  1. Diarrhea in  adult patient  Criss Rosales diet Encourage hydration Await stool studies.  Remove meds that may cause diarrhea such as urecholine and protonix. If no improvement refer to GI.  She has a hx of ulcerative colitis documented in epic and also lymphocytic colitis documented in 2012 by bx. which responded to Entocort. She does have weight loss as well but does not appear outwardly dehydrated and has normal VS  2. GERD Hold protonix and see if diarrhea improves, if not would restart  3. Urinary retention She has not had any urinary retention in recent months. Will trial off urecholine due to s/e of diarrhea. Also would like to to see how she does without it at this point given that it would be antagonistic to use if with Bentyl.    Family/ staff Communication: discussed with resident and staff  Labs/tests ordered: O and P, stool culture cdiff, hemoccult, stool culture pending

## 2017-12-16 DIAGNOSIS — A041 Enterotoxigenic Escherichia coli infection: Secondary | ICD-10-CM | POA: Diagnosis not present

## 2017-12-16 DIAGNOSIS — Z86010 Personal history of colonic polyps: Secondary | ICD-10-CM | POA: Diagnosis not present

## 2017-12-16 DIAGNOSIS — R197 Diarrhea, unspecified: Secondary | ICD-10-CM | POA: Diagnosis not present

## 2017-12-16 DIAGNOSIS — A0472 Enterocolitis due to Clostridium difficile, not specified as recurrent: Secondary | ICD-10-CM | POA: Diagnosis not present

## 2017-12-19 ENCOUNTER — Ambulatory Visit (INDEPENDENT_AMBULATORY_CARE_PROVIDER_SITE_OTHER): Payer: Medicare Other | Admitting: *Deleted

## 2017-12-19 DIAGNOSIS — I639 Cerebral infarction, unspecified: Secondary | ICD-10-CM

## 2017-12-19 DIAGNOSIS — D649 Anemia, unspecified: Secondary | ICD-10-CM | POA: Diagnosis not present

## 2017-12-19 DIAGNOSIS — Z79899 Other long term (current) drug therapy: Secondary | ICD-10-CM | POA: Diagnosis not present

## 2017-12-19 LAB — BASIC METABOLIC PANEL
BUN: 9 (ref 4–21)
Creatinine: 1.2 — AB (ref 0.5–1.1)
Glucose: 123
Potassium: 3.3 — AB (ref 3.4–5.3)
Sodium: 131 — AB (ref 137–147)

## 2017-12-19 LAB — CBC AND DIFFERENTIAL
HCT: 33 — AB (ref 36–46)
Hemoglobin: 11 — AB (ref 12.0–16.0)
Platelets: 272 (ref 150–399)
WBC: 9.9

## 2017-12-19 LAB — HEPATIC FUNCTION PANEL
ALT: 8 (ref 7–35)
AST: 13 (ref 13–35)
Alkaline Phosphatase: 92 (ref 25–125)
Bilirubin, Total: 0.3

## 2017-12-21 NOTE — Progress Notes (Signed)
Carelink Summary Report / Loop Recorder 

## 2017-12-23 ENCOUNTER — Encounter: Payer: Self-pay | Admitting: Internal Medicine

## 2017-12-23 ENCOUNTER — Ambulatory Visit (INDEPENDENT_AMBULATORY_CARE_PROVIDER_SITE_OTHER): Payer: Medicare Other | Admitting: Nurse Practitioner

## 2017-12-23 ENCOUNTER — Encounter: Payer: Self-pay | Admitting: Nurse Practitioner

## 2017-12-23 ENCOUNTER — Telehealth: Payer: Self-pay | Admitting: Nurse Practitioner

## 2017-12-23 VITALS — BP 118/70 | HR 66 | Ht 60.0 in | Wt 142.0 lb

## 2017-12-23 DIAGNOSIS — R197 Diarrhea, unspecified: Secondary | ICD-10-CM | POA: Diagnosis not present

## 2017-12-23 DIAGNOSIS — K52839 Microscopic colitis, unspecified: Secondary | ICD-10-CM

## 2017-12-23 DIAGNOSIS — I639 Cerebral infarction, unspecified: Secondary | ICD-10-CM

## 2017-12-23 MED ORDER — BUDESONIDE 3 MG PO CPEP: 3.0000 mg | ORAL_CAPSULE | ORAL | 0 refills | Status: DC

## 2017-12-23 NOTE — Patient Instructions (Signed)
If you are age 82 or older, your body mass index should be between 23-30. Your Body mass index is 27.73 kg/m. If this is out of the aforementioned range listed, please consider follow up with your Primary Care Provider.  If you are age 34 or younger, your body mass index should be between 19-25. Your Body mass index is 27.73 kg/m. If this is out of the aformentioned range listed, please consider follow up with your Primary Care Provider.   Discontinue Senokot.  Start Entocort 3 mg - take 3 tablets every day for 2 weeks, then 2 tablets every day for 2 weeks, then 1 tablet every day for 2 weeks.  Please have [facility] nurse call in two weeks with an update.  Ask for Beth.  Thank you for choosing me and [facility].   Tye Savoy, NP

## 2017-12-23 NOTE — Progress Notes (Signed)
Chief Complaint:   Diarrhea   IMPRESSION and PLAN:    84 yo female history of lymphocytic colitis diarrhea in 2012 treated with Entocort. Now with a week of watery diarrhea, several times a day. I spoke with [facility] Nurse over phone. Sounds like the diarrhea has been going on longer but became much worse a week ago. Stool stuides negative except for positive lactoferrin. -Senokot listed on facility med list but nurse says that it is being held.  I will D/C it from list today. -Patient has recently had an 8 pound weight loss which may or may not be related to the diarrhea.  I think it is reasonable to treat her for possible relapse of lymphocytic colitis. -Entocort 9 mg daily for 2 weeks then 6 mg daily for 2 weeks then 3 mg daily for 2 weeks.  -Requested that the [facility] nurse call our office in about a week to 10 days to give a condition update.   Addendum:  We received a call back from [facility].  Patient was actually started on Entocort 9 mg daily a couple of days ago by her PCP.   We will continue with the tapering dose as prescribed at today's visit.  HPI:     Patient is an 84 yo female with hx of a A. fib, DVT, PE hypertension and CVA.  She had a hemorrhagic stroke in June 2019 secondary to uncontrolled HTN and Eliquis. She resides at [facility]. Patient has expressive aphasia so history mainly comes from chart and I spoke with facility Nurse over the phone.  Patient does give a history of episodic diarrhea in the past but seemed to be doing fairly well until about a week ago.  Per staff at [facility], patient has been having up to 6 watery bowel movements in an 8-hour period  Stool studies positive for lactoferrin, negative for occult blood, C. difficile, O&P. Enteric bacterial panel negative.  Patient was previously followed by Dr. Olevia Perches who diagnosed her with lymphocytic colitis on flexible sigmoidoscopy in 2012 following onset of acute diarrhea.  She was treated  with budesonide and not seen again until 2017 and that was for evaluation of choledocholithiasis /  biliary duct dilation seen on MRI.  The ERCP by Dr. Ardis Hughs was not successful.  Patient was offered a tertiary care referral.  It appears that the final decision was to hold off on ERCP as she was asymptomatic, of advanced age, on blood thinners.    Review of systems:   Not sure how reliable information is. She has expressive aphasia.   No chest pain, no SOB, no fevers, no urinary sx.   Past Medical History:  Diagnosis Date  . Abdominal mass   . AKI (acute kidney injury) ([place]) 09/15/2014  . Atrial fibrillation ([place])   . Colitis, ulcerative ([place])   . Cough    WHITE SPUTUM FOR 2 WEEKS, NO FEVER  . Degenerative joint disease   . Diverticulosis   . DVT (deep venous thrombosis) ([place]) 2009   BOTH LUNGS  . GERD (gastroesophageal reflux disease)   . Hyperlipidemia   . Hyperplastic colon polyp   . Hypertension   . Hypothyroidism   . Iron deficiency anemia    ON IRON AT TIMES  . PE (pulmonary embolism) 2009  . Persistent atrial fibrillation   . Positive H. pylori test 01/20/96  . S/P dilatation of esophageal stricture 1992  . Skin cancer    h/o basal and squamous  skin cancer--> removed  . Stroke [facility])     Patient's surgical history, family medical history, social history, medications and allergies were all reviewed in Epic   Creatinine clearance cannot be calculated (Patient's most recent lab result is older than the maximum 21 days allowed.)  Current Outpatient Medications  Medication Sig Dispense Refill  . acetaminophen (TYLENOL ARTHRITIS PAIN) 650 MG CR tablet Take 650 mg by mouth every 8 (eight) hours as needed for pain.     Marland Kitchen amiodarone (PACERONE) 100 MG tablet Take 1 tablet (100 mg total) by mouth daily. 90 tablet 3  . atorvastatin (LIPITOR) 10 MG tablet Take 1 tablet (10 mg total) by mouth daily. 90 tablet 3  . bethanechol (URECHOLINE) 10 MG tablet Take 1 tablet (10 mg total) by  mouth 3 (three) times daily.    . carvedilol (COREG) 6.25 MG tablet Take 3 tablets (18.75 mg total) by mouth 2 (two) times daily with a meal.    . dicyclomine (BENTYL) 20 MG tablet Take 20 mg by mouth 3 (three) times daily before meals.    . hydroxypropyl methylcellulose / hypromellose (ISOPTO TEARS / GONIOVISC) 2.5 % ophthalmic solution Place 1 drop into both eyes as needed for dry eyes. 15 mL 12  . levothyroxine (SYNTHROID, LEVOTHROID) 75 MCG tablet Take 1 tablet (75 mcg total) by mouth daily. 90 tablet 3  . pantoprazole (PROTONIX) 40 MG tablet Take 1 tablet (40 mg total) by mouth daily.    . potassium chloride 20 MEQ/15ML (10%) SOLN Take 7.5 mLs (10 mEq total) by mouth daily. 450 mL 0  . senna-docusate (SENOKOT-S) 8.6-50 MG tablet Take 1 tablet by mouth 2 (two) times daily. (Patient taking differently: Take 1 tablet by mouth 2 (two) times daily as needed. )    . Simethicone (GAS RELIEF PO) Take 1 tablet by mouth 2 (two) times daily.    Marland Kitchen tiZANidine (ZANAFLEX) 2 MG tablet Take 1 tablet (2 mg total) by mouth 4 (four) times daily -  with meals and at bedtime. 30 tablet 0   No current facility-administered medications for this visit.     Physical Exam:     BP 118/70   Pulse 66   Ht 5' (1.524 m)   Wt 142 lb (64.4 kg)   BMI 27.73 kg/m   GENERAL:  Pleasant female in NAD PSYCH: : Cooperative, normal affect EENT:  conjunctiva pink, mucous membranes moist, neck supple without masses CARDIAC:  RRR,  no peripheral edema PULM: Normal respiratory effort, lungs CTA bilaterally, no wheezing ABDOMEN:  In wheelchair. Abdomen nondistended, soft, nontender. Normal bowel sounds SKIN:  turgor, no lesions seen Musculoskeletal:  Normal muscle tone, normal strength NEURO: Alert, right hemiparesis. Has expressive aphasia   Tye Savoy , NP 12/23/2017, 10:34 AM

## 2017-12-23 NOTE — Telephone Encounter (Signed)
Budesonide not listed in medication list..  Per Nevin Bloodgood, patient needs to take Budesonide as prescribed today.  I spoke with Nona Dell and she faxed over an order to discontinue Budesonide 9 mg daily as prescribed by C. Melvyn Novas, NP.

## 2017-12-24 ENCOUNTER — Encounter: Payer: Self-pay | Admitting: Nurse Practitioner

## 2017-12-25 ENCOUNTER — Encounter: Payer: Self-pay | Admitting: Adult Health

## 2017-12-25 ENCOUNTER — Ambulatory Visit (INDEPENDENT_AMBULATORY_CARE_PROVIDER_SITE_OTHER): Payer: Medicare Other | Admitting: Adult Health

## 2017-12-25 VITALS — BP 153/79 | HR 63 | Ht 60.0 in | Wt 142.0 lb

## 2017-12-25 DIAGNOSIS — I10 Essential (primary) hypertension: Secondary | ICD-10-CM

## 2017-12-25 DIAGNOSIS — E785 Hyperlipidemia, unspecified: Secondary | ICD-10-CM

## 2017-12-25 DIAGNOSIS — I610 Nontraumatic intracerebral hemorrhage in hemisphere, subcortical: Secondary | ICD-10-CM | POA: Diagnosis not present

## 2017-12-25 DIAGNOSIS — R4701 Aphasia: Secondary | ICD-10-CM | POA: Diagnosis not present

## 2017-12-25 MED ORDER — ASPIRIN 81 MG PO TABS: 81.0000 mg | ORAL_TABLET | Freq: Every day | ORAL | Status: DC

## 2017-12-25 NOTE — Progress Notes (Signed)
[facility] [address] [place]. [place] [address] [phone]       OFFICE FOLLOW UP NOTE  [name] Date of Birth:  23-Jul-1930 Medical Record Number:  [id number]   Reason for Referral:  hospital ICH follow up  CHIEF COMPLAINT:  Chief Complaint  Patient presents with  . Follow-up    Subcortical hemorrhage follow up room 9 pt at [facility] facility pt with transporter Robin and Camy her daugter in law    HPI: Sara Russell is being seen today for initial visit in the office for ICH related to uncontrolled HTN and Eliquis on 08/02/2017. History obtained from patient, sons and chart review. Reviewed all radiology images and labs personally.  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  She sat down for dinner and then was noted to be "having a stroke" by companions at the table, with the above symptoms. BP prior to arrival was221/89. The patient was currently on Eliquis for a-fib. Other stroke risk factors are HLD and HTN. She has a history of DVT and PE as well.  CT head reviewed and showed an acute left thalamic/internal capsule/basal ganglia intraparenchymal hemorrhage.  As she was on Eliquis, it was reversed with Andexxa. She was admitted to the neuro ICU for further evaluation and treatment.  MRI head reviewed and showed similar size of acute left thalamic intraparenchymal hemorrhage, measuring 1.9 x 2.0 x 2.5 cm along with mildly increased localized edema with trace left to right midline shift and tubes subcentimeter subacute ischemic cortical infarcts involving the left frontal and peri-occipital regions.  Repeat CT head stable.  Carotid Doppler showed bilateral ICA stenosis of 1 to 39% and VAs antegrade.  2D echo  showed an EF of 60 to 65% without cardiac source of embolus.  LDL not performed the patient was on Lipitor 10 mg daily prior and this was recommended to continue.  HTN was treated with Cleviprex and was stable throughout the rest of admission with long-term BP goal normotensive range.  A1c satisfactory at 5.3.  Eliquis stopped due to [place] and recommended aspirin 81 mg at discharge and after 1 months recommended to repeat CT scan and if resolved, recommended to consider resuming Eliquis.  Patient was discharged to [facility] for continued therapies and then discharged on 08/27/2017 from CIR back to [facility].  12/25/2017 visit: Patient is being seen today for hospital follow-up and is accompanied by her 2 sons.  She continues to have expressive aphasia and right hemiparesis but this has been improving.  She currently is receiving PT/OT/ST at [facility]. She currently is sitting in wheelchair due to transportation but typically uses rolling walker for ambulation and denies any recent falls. Patient was not discharged on aspirin as it was recommended during hospitalization nor did she receive repeat CT scan.  She continues to take Lipitor without side effects of myalgias.  Sounds have also been concerned regarding worsening of memory since her stroke and denies any previous memory concerns.  Short-term memory loss is her main concern but at times can have difficulty with long-term memory such as names of her family members.  They have seen some improvement with her memory but it can wax and wane.  Blood pressure today satisfactory 124/73 and from  what sons are aware of, blood pressure has been stable at facility.  Denies new or worsening stroke/TIA symptoms.  Interval history 12/25/2017: Patient is being seen today for follow-up visit and is accompanied by her daughter and facility transport.  She continues to reside at [facility] facility.  Continues to have mild right hemiparesis and  mild to moderate expressive aphasia but overall has been stable without worsening or new symptoms.  She has completed all therapies but continues to do exercises on her own along with staying active.  She continues to use rolling walker for ambulation but has done so without complication and denies any recent falls.  She did have repeat CT scan which did show resolution of prior ICH and was recommended to restart Eliquis for atrial fibrillation and secondary stroke prevention.  Per cardiology follow-up note, patient and son were questioning whether it would be safe to restart Eliquis due to recent [place].  Therefore, ILR was placed on 10/14/2017 to determine atrial fibrillation burden.  At follow-up visit with cardiology, ILR had not detected atrial fibrillation since placement therefore is agreed upon to hold off on restarting Eliquis unless A. fib is detected on ILR.  ILR has not shown atrial fibrillation thus far.  Continues to take Lipitor 10 mg daily without side effects myalgias.  Blood pressure today 153/79.  No further concerns at this time.  Denies new or worsening stroke/TIA symptoms.     ROS:   14 system review of systems performed and negative with exception of abdominal pain, diarrhea, nausea and speech difficulty  PMH:  Past Medical History:  Diagnosis Date  . Abdominal mass   . AKI (acute kidney injury) ([place]) 09/15/2014  . Atrial fibrillation ([place])   . Colitis, ulcerative ([place])   . Cough    WHITE SPUTUM FOR 2 WEEKS, NO FEVER  . Degenerative joint disease   . Diverticulosis   . DVT (deep venous thrombosis) ([place]) 2009   BOTH LUNGS  . GERD (gastroesophageal reflux disease)   . Hyperlipidemia   . Hyperplastic colon polyp   . Hypertension   . Hypothyroidism   . Iron deficiency anemia    ON IRON AT TIMES  . PE (pulmonary embolism) 2009  . Persistent atrial fibrillation   . Positive H. pylori test 01/20/96  . S/P dilatation of esophageal stricture 1992  . Skin cancer    h/o basal  and squamous skin cancer--> removed  . Stroke [facility])     PSH:  Past Surgical History:  Procedure Laterality Date  . APPENDECTOMY    . BREAST EXCISIONAL BIOPSY Left 1992   benign  . BREAST LUMPECTOMY     benign  . DILATION AND CURETTAGE OF UTERUS     for endometrial polyps  . ENDOSCOPIC RETROGRADE CHOLANGIOPANCREATOGRAPHY (ERCP) WITH PROPOFOL N/A 03/23/2015   Procedure: ENDOSCOPIC RETROGRADE CHOLANGIOPANCREATOGRAPHY (ERCP) WITH PROPOFOL;  Surgeon: Milus Banister, MD;  Location: [facility] ENDOSCOPY;  Service: Endoscopy;  Laterality: N/A;  . JOINT REPLACEMENT    . LOOP RECORDER INSERTION N/A 10/14/2017   Procedure: LOOP RECORDER INSERTION;  Surgeon: Thompson Grayer, MD;  Location: [facility];  Service: Cardiovascular;  Laterality: N/A;  . ORIF ANKLE FRACTURE Left    X 2  . REPLACEMENT TOTAL KNEE     left  . SURGERY DONE TO REMOVE LEFT ANKLE HARDWARE LATER    . TONSILLECTOMY      Social History:  Social History   Socioeconomic History  . Marital status: Widowed  Spouse name: Not on file  . Number of children: 3  . Years of education: college  . Highest education level: Bachelor's degree (e.g., BA, AB, BS)  Occupational History  . Occupation: retired Optician, dispensing: RETIRED  Social Needs  . Financial resource strain: Not on file  . Food insecurity:    Worry: Never true    Inability: Not on file  . Transportation needs:    Medical: No    Non-medical: No  Tobacco Use  . Smoking status: Former Smoker    Packs/day: 0.50    Years: 13.00    Pack years: 6.50    Types: Cigarettes    Start date: 02/12/1948    Last attempt to quit: 02/11/1961    Years since quitting: 56.9  . Smokeless tobacco: Never Used  . Tobacco comment: Denies use of tobacco since 1963  Substance and Sexual Activity  . Alcohol use: Yes    Alcohol/week: 5.0 standard drinks    Types: 5 Glasses of wine per week    Comment: WINE FEW TIMES PER WEEK  . Drug use: No  . Sexual activity: Never  Lifestyle  .  Physical activity:    Days per week: Not on file    Minutes per session: Not on file  . Stress: Not on file  Relationships  . Social connections:    Talks on phone: Not on file    Gets together: Not on file    Attends religious service: Not on file    Active member of club or organization: Not on file    Attends meetings of clubs or organizations: Not on file    Relationship status: Not on file  . Intimate partner violence:    Fear of current or ex partner: Not on file    Emotionally abused: Not on file    Physically abused: Not on file    Forced sexual activity: Not on file  Other Topics Concern  . Not on file  Social History Narrative   Health Care POA:    Emergency Contact: son, Janequa Kipnis (c) [phone] [name])   Diet: Pt has a varied diet of protein, starch, vegetables.   Exercise: Pt has no regular exercise plan because of pain in back.   Seatbelts: Pt reports wearing seatbelt when in vehicle.   Nancy Fetter Exposure/Protection: Pt reports wearing sun screen.   Hobbies: reading, traveling, eating out.      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 Silvano Rusk, RN, BSN  Family History:  Family History  Problem Relation Age of Onset  . Esophageal cancer Brother   . Diverticulitis Brother   . Heart disease Mother   . Stroke Mother   . Dementia Mother   . Heart attack Mother   . Heart disease Father   . Atrial fibrillation Father   . Breast cancer Maternal Aunt   . Colon cancer Neg Hx     Medications:   Current Outpatient Medications on File Prior to Visit  Medication Sig Dispense Refill  . acetaminophen (TYLENOL ARTHRITIS PAIN) 650 MG CR tablet Take 650 mg by mouth every 8 (eight) hours as needed for pain.     Marland Kitchen amiodarone (PACERONE) 100 MG tablet Take 1 tablet (100 mg total) by mouth daily. 90 tablet 3  . atorvastatin (LIPITOR) 10 MG tablet Take 1 tablet (10 mg total) by mouth daily. 90 tablet 3  . budesonide (ENTOCORT EC) 3 MG 24 hr capsule Take 1 capsule (3 mg total) by mouth as directed. Take 3 tablets daily for 2 weeks, then 2 tablets daily for 2 weeks, then 1 tablet daily for 2 weeks. 84 capsule 0  . carvedilol (COREG) 6.25 MG tablet Take 3 tablets (18.75 mg total) by mouth 2 (two) times daily with a meal.    . ENSURE (ENSURE) Take 237 mLs by mouth 2 (two) times daily.    . hydroxypropyl methylcellulose / hypromellose (ISOPTO TEARS / GONIOVISC) 2.5 % ophthalmic solution Place 1 drop into both eyes as needed for dry eyes. 15 mL 12  . levothyroxine (SYNTHROID, LEVOTHROID) 75 MCG tablet Take 1 tablet (75 mcg total) by mouth daily. 90 tablet 3  . pantoprazole (PROTONIX) 40 MG tablet Take 1 tablet (40 mg total) by mouth daily.    . potassium chloride 20 MEQ/15ML (10%) SOLN Take 7.5 mLs (10 mEq total) by mouth daily. 450 mL 0  . senna-docusate (SENOKOT-S) 8.6-50 MG tablet Take 1 tablet by mouth 2 (two) times daily. (Patient taking differently: Take 1 tablet by mouth 2 (two) times daily as needed. )    . Simethicone  (GAS RELIEF PO) Take 1 tablet by mouth 2 (two) times daily.    Marland Kitchen tiZANidine (ZANAFLEX) 2 MG tablet Take 1 tablet (2 mg total) by mouth 4 (four) times daily -  with meals and at bedtime. 30 tablet 0  . traZODone (DESYREL) 50 MG tablet Take 50 mg by mouth as needed for sleep.     No current facility-administered medications on file prior to visit.     Allergies:   Allergies  Allergen Reactions  . Iohexol Hives  . Ivp Dye [Iodinated Diagnostic Agents] Hives    Was able to tolerate Contrast Media for CT scan.  [name] [Shellfish Allergy] Nausea And Vomiting    Projectile vomiting  . Shellfish-Derived Products Nausea And Vomiting  . Sulfa Antibiotics Hives  . Sulfamethoxazole Hives     Physical Exam  Vitals:   12/25/17 0920  BP: (!) 153/79  Pulse: 63  Weight: 142 lb (64.4 kg)  Height: 5' (1.524 m)   Body mass index is 27.73 kg/m. No exam data present  General: well developed, well nourished, pleasant elderly Caucasian female, seated, in no evident distress Head: head normocephalic and atraumatic.   Neck: supple with no carotid or supraclavicular bruits Cardiovascular: regular rate and rhythm, no murmurs Musculoskeletal: no deformity Skin:  no rash/petichiae Vascular:  Normal pulses all extremities  Neurologic Exam Mental Status: Awake and fully alert.  Mild to moderate  expressive aphasia.  Oriented to place and time. Recent and remote memory intact. Attention span, concentration and fund of knowledge appropriate. Mood and affect appropriate.  Cranial Nerves: Fundoscopic exam reveals sharp disc margins. Pupils equal, briskly reactive to light. Extraocular movements full without nystagmus. Visual fields full to confrontation. Hearing intact. Facial sensation intact. Face, tongue, palate moves normally and symmetrically.  Motor: Normal bulk and tone. Normal strength in all tested extremity muscles except for very minimal right-sided weakness. Sensory.: intact to touch ,  pinprick , position and vibratory sensation.  Coordination: Rapid alternating movements normal in all extremities. Finger-to-nose and heel-to-shin performed accurately bilaterally with mild ataxia in right upper and lower extremity Gait and Station: Arises from chair without difficulty. Stance is normal. Gait demonstrates normal stride length and balance with assistance of rolling walker. Reflexes: 1+ and symmetric. Toes downgoing.     Diagnostic Data (Labs, Imaging, Testing)  Ct Head Code Stroke Wo Contrast 08/02/2017  IMPRESSION:  1. Acute hemorrhage centered within left thalamus/posterior limb of internal capsule measuring up to 1.9 cm, 2.5 cc. Mild local mass effect.  2. Stable chronic microvascular ischemic changes and parenchymal volume loss of the brain.  3. ASPECTS is not applicable.   Mr Brain Wo Contrast 08/03/2017 IMPRESSION:  1. Similar size of acute left thalamic intraparenchymal hemorrhage, measuring 1.9 x 2.0 x 2.5 cm. Mildly increased localized edema with trace left-to-right midline shift. No intraventricular extension, hydrocephalus, or other complication identified. No underlying mass or other abnormality.  2. Two subcentimeter subacute ischemic cortical infarcts involving the left frontal and parietooccipital regionsas above.  3.Additional scattered chronic micro hemorrhages involving the cerebral and cerebellar hemispheres, favored to be secondary to chronic uncontrolled hypertension. 4. Atrophy with chronic microvascular ischemic disease.   CT Head Wo Contrast  08/03/2017 2043 1. Stable acute hemorrhage centered within left thalamus. Mild increase in edema and mass effect from 08/02/2017 with 4 mm left-to-right midline shift, previously 3 mm. No herniation. 2. No new acute intracranial abnormality. 3. Stable chronic microvascular ischemic changes and parenchymal volume loss of the brain.   Dg Pelvis Portable 08/03/2017 IMPRESSION:  No acute osseous abnormality.     Dg Femur [place], [place] 2 Views Right 08/03/2017 IMPRESSION:  1. No acute osseous abnormality.  2. Mild irregularity of the right inferior pubic ramus is questionable for fracture.   Transthoracic Echocardiogram - Left ventricle: The cavity size was normal. Wall thickness wasnormal. Systolic function was normal. the estimated ejection fraction was in the range of 60% to 65%. Wall motion was normal;there were no regional wall motion abnormalities. Features are consistent with a pseudonormal left ventricular filling pattern,with concomitant abnormal relaxation and increased fillingpressure (grade 2 diastolic dysfunction). - Mitral valve: Calcified annulus. - Tricuspid valve: There was mild-moderate regurgitation directed centrally. - Pulmonary arteries: Systolic pressure was mildly increased. PA peak pressure: 41 mm Hg (S).  Bilateral Carotid Dopplers  No evidence of stenosis in bilateral carotid arteries     ASSESSMENT: Sara Russell is a 84 y.o. year old female here with hemorrhagic stroke on 08/02/17 secondary to uncontrolled HTN and eliquis. Vascular risk factors include HLD, HTN, AF and DVT history.  Patient is being seen today for follow-up visit and does continue to have deficits of mild to moderate expressive aphasia and mild right hemiparesis but overall has been stable without new or worsening stroke/TIA symptoms.   PLAN: -Recommend to initiate aspirin 81 mg and continuation of Lipitor for secondary stroke prevention -F/u with PCP regarding your HLD and  HTN management -f/u with cardiologist as scheduled for atrial fibrillation, ILR and possible initiation in the future of AC management -continue to monitor BP at home -Continue to stay active along with doing home exercises for continued deficits -Maintain strict control of hypertension with blood pressure goal below 130/90, diabetes with hemoglobin A1c goal below 6.5% and cholesterol with LDL cholesterol (bad cholesterol)  goal below 70 mg/dL. I also advised the patient to eat a healthy diet with plenty of whole grains, cereals, fruits and vegetables, exercise regularly and maintain ideal body weight.  Follow up in 6 months or call earlier if needed   Greater than 50% of time during this 25 minute visit was spent on counseling,explanation of diagnosis of hemorrhagic stroke, reviewing risk factor management of HTN, HLD, AF and DVT history, planning of further management, discussion with patient and family and coordination of care    Venancio Poisson, [facility]  [facility] [address] [place], [place] [address]  Phone [phone] Fax [phone]

## 2017-12-25 NOTE — Patient Instructions (Signed)
Start aspirin 81 mg daily  and lipitor 23m  for secondary stroke prevention  Continue to follow up with PCP regarding cholesterol and blood pressure management   Continue to follow with cardiology for atrial fibrillation management and loop recorder monitoring  Continue to stya active while doing home therapies on your own  Continue to monitor blood pressure at home  Maintain strict control of hypertension with blood pressure goal below 130/90, diabetes with hemoglobin A1c goal below 6.5% and cholesterol with LDL cholesterol (bad cholesterol) goal below 70 mg/dL. I also advised the patient to eat a healthy diet with plenty of whole grains, cereals, fruits and vegetables, exercise regularly and maintain ideal body weight.  Followup in the future with me in 6 months or call earlier if needed       Thank you for [facility]. I hope we have been able to provide you high quality care today.  You may receive a patient satisfaction survey over the next few weeks. We would appreciate your feedback and comments so that we may continue to improve ourselves and the health of our patients.

## 2017-12-27 NOTE — Progress Notes (Signed)
I agree with the above plan 

## 2017-12-30 ENCOUNTER — Telehealth: Payer: Self-pay | Admitting: Nurse Practitioner

## 2017-12-30 NOTE — Telephone Encounter (Signed)
Great news! Must have been the lymphocytic colitis relapsing. Thanks

## 2017-12-31 NOTE — Telephone Encounter (Signed)
[facility] calling to give update states that patient had no complains and it's been a week No call back needed

## 2018-01-01 DIAGNOSIS — B351 Tinea unguium: Secondary | ICD-10-CM | POA: Diagnosis not present

## 2018-01-01 NOTE — Progress Notes (Signed)
Reviewed and agree with documentation and assessment and plan. K. Veena Nandigam , MD   

## 2018-01-21 ENCOUNTER — Encounter: Payer: Self-pay | Admitting: Nurse Practitioner

## 2018-01-21 ENCOUNTER — Ambulatory Visit (INDEPENDENT_AMBULATORY_CARE_PROVIDER_SITE_OTHER): Payer: Medicare Other

## 2018-01-21 ENCOUNTER — Ambulatory Visit (INDEPENDENT_AMBULATORY_CARE_PROVIDER_SITE_OTHER): Payer: Medicare Other | Admitting: Nurse Practitioner

## 2018-01-21 VITALS — BP 118/60 | HR 60 | Ht 60.0 in | Wt 138.0 lb

## 2018-01-21 DIAGNOSIS — I639 Cerebral infarction, unspecified: Secondary | ICD-10-CM | POA: Diagnosis not present

## 2018-01-21 DIAGNOSIS — K645 Perianal venous thrombosis: Secondary | ICD-10-CM

## 2018-01-21 DIAGNOSIS — K5900 Constipation, unspecified: Secondary | ICD-10-CM | POA: Diagnosis not present

## 2018-01-21 NOTE — Progress Notes (Signed)
Chief Complaint:    hemorrhoids  IMPRESSION and PLAN:    65. 84 yo female with recent extended diarrheal illness, treated empirically with Entocort for relapsing lymphocytic colitis with resolution of symptoms. Now with firm stool in vault on DRE  2. Thrombosed external hemorrhoid. Partially thrombosed, not tense. -Would extend use of steroid cream by an additional 7 days.  Apply internally and externally twice daily.  -Can use Recticare TID as needed for associated discomfort -Will need to treat constipation.  - We sometimes band problematic internal hemorrhoids but this one is external. Please monitor, if gets worse then would recommend surgical evaluation.   3. Constipation, firm stool in vault on DRE.  -Recommend starting Miralax once daily along with Colace Q HS.       HPI:    Patient is an 84 yo female with multiple medical problems not limited to PAF, HTN, expressive aphasia secondary to hemorrhagic CVA over the summer. She has a remote history of lymphocytic colitis. I saw her mid November for evaluation of diarrhea.    Treated empirically with course of Entocort and diarrhea has resolved.  Patient has now been referred by Dr. Mariea Clonts  for evaluation of a large hemorrhoid.  Staff member is present, tries to help provide history as patient is unable to.  We called the nursing home and spoke to RN to obtain details about the hemorrhoid.  Treated with unknown type of hemorrhoidal cream (prescription strength per RN ) for 7 days but apparently there hasn't been much improvement and patient complaining of pain in the area.    Review of systems:     No chest pain, no SOB, no fevers, no urinary sx   Past Medical History:  Diagnosis Date  . Atrial fibrillation ([place])   . Colitis, ulcerative ([place])   . Degenerative joint disease   . Diverticulosis   . DVT (deep venous thrombosis) ([place]) 2009   BOTH LUNGS  . GERD (gastroesophageal reflux disease)   . Hyperlipidemia   .  Hyperplastic colon polyp   . Hypertension   . Hypothyroidism   . Iron deficiency anemia    ON IRON AT TIMES  . PE (pulmonary embolism) 2009  . Persistent atrial fibrillation   . Positive H. pylori test 01/20/96  . S/P dilatation of esophageal stricture 1992  . Skin cancer    h/o basal and squamous skin cancer--> removed  . Stroke [facility])     Patient's surgical history, family medical history, social history, medications and allergies were all reviewed in Epic   Creatinine clearance cannot be calculated (Patient's most recent lab result is older than the maximum 21 days allowed.)  Current Outpatient Medications  Medication Sig Dispense Refill  . acetaminophen (TYLENOL ARTHRITIS PAIN) 650 MG CR tablet Take 650 mg by mouth every 8 (eight) hours as needed for pain.     Marland Kitchen amiodarone (PACERONE) 100 MG tablet Take 1 tablet (100 mg total) by mouth daily. 90 tablet 3  . aspirin 81 MG tablet Take 1 tablet (81 mg total) by mouth daily. 30 tablet   . atorvastatin (LIPITOR) 10 MG tablet Take 1 tablet (10 mg total) by mouth daily. 90 tablet 3  . budesonide (ENTOCORT EC) 3 MG 24 hr capsule Take 1 capsule (3 mg total) by mouth as directed. Take 3 tablets daily for 2 weeks, then 2 tablets daily for 2 weeks, then 1 tablet daily for 2 weeks. 84 capsule 0  . carvedilol (COREG) 6.25  MG tablet Take 3 tablets (18.75 mg total) by mouth 2 (two) times daily with a meal.    . ENSURE (ENSURE) Take 237 mLs by mouth 2 (two) times daily.    . hydroxypropyl methylcellulose / hypromellose (ISOPTO TEARS / GONIOVISC) 2.5 % ophthalmic solution Place 1 drop into both eyes as needed for dry eyes. 15 mL 12  . levothyroxine (SYNTHROID, LEVOTHROID) 75 MCG tablet Take 1 tablet (75 mcg total) by mouth daily. 90 tablet 3  . pantoprazole (PROTONIX) 40 MG tablet Take 1 tablet (40 mg total) by mouth daily.    . potassium chloride 20 MEQ/15ML (10%) SOLN Take 7.5 mLs (10 mEq total) by mouth daily. 450 mL 0  . senna-docusate (SENOKOT-S)  8.6-50 MG tablet Take 1 tablet by mouth 2 (two) times daily. (Patient taking differently: Take 1 tablet by mouth 2 (two) times daily as needed. )    . Simethicone (GAS RELIEF PO) Take 1 tablet by mouth 2 (two) times daily.    Marland Kitchen tiZANidine (ZANAFLEX) 2 MG tablet Take 1 tablet (2 mg total) by mouth 4 (four) times daily -  with meals and at bedtime. 30 tablet 0  . traZODone (DESYREL) 50 MG tablet Take 50 mg by mouth as needed for sleep.     No current facility-administered medications for this visit.     Physical Exam:     BP 118/60   Pulse 60   Ht 5' (1.524 m)   Wt 138 lb (62.6 kg)   SpO2 97%   BMI 26.95 kg/m   GENERAL:  Pleasant female in NAD PSYCH: : Cooperative, normal affect CARDIAC:  RRR, no murmur heard, no peripheral edema PULM: Normal respiratory effort, lungs CTA bilaterally, no wheezing ABDOMEN:  Nondistended, soft, nontender. No obvious masses, normal bowel sounds RECTAL: large partially thrombosed external hemorrhoid on right side. Firm stool in vault NEURO: Alert, + aphasia. Sentences containing some words not related to topic being discussed   Tye Savoy , NP 01/21/2018, 9:50 AM   Cc: Hollace Kinnier, DO

## 2018-01-21 NOTE — Patient Instructions (Addendum)
If you are age 82 or older, your body mass index should be between 23-30. Your Body mass index is 26.95 kg/m. If this is out of the aforementioned range listed, please consider follow up with your Primary Care Provider.  If you are age 59 or younger, your body mass index should be between 19-25. Your Body mass index is 26.95 kg/m. If this is out of the aformentioned range listed, please consider follow up with your Primary Care Provider.   Anusol HC cream - apply inside and perianally twice daily for additional 7 days. Miralax 1 capful daily. Colace - 1 at bedtime. Recticare cream - apply three times daily to hemorrhoid as needed for pain.  Follow up as needed.  Thank you for choosing me and [facility].   Tye Savoy, NP

## 2018-01-23 NOTE — Progress Notes (Signed)
Carelink Summary Report / Loop Recorder 

## 2018-02-02 ENCOUNTER — Telehealth: Payer: Self-pay | Admitting: Nurse Practitioner

## 2018-02-02 NOTE — Telephone Encounter (Signed)
Sheri from [facility] where patient resides states pt is wanting to be referred to a surgeon for hemorrhoid removal surgery. Patient last seen 12.11.19. Please advise and call [facility] at [phone] and ask for a nurse.

## 2018-02-02 NOTE — Telephone Encounter (Signed)
Sara Russell per your last office note surgical eval ok "if she gets worse then would recommend surgical evaluation."  Is it ok to send referral?

## 2018-02-07 LAB — CUP PACEART REMOTE DEVICE CHECK
Date Time Interrogation Session: 20191108173932
Implantable Pulse Generator Implant Date: 20190903

## 2018-02-09 NOTE — Telephone Encounter (Signed)
Surgical referral sent to [facility] per office note.

## 2018-02-11 ENCOUNTER — Emergency Department (HOSPITAL_COMMUNITY)
Admission: EM | Admit: 2018-02-11 | Discharge: 2018-02-11 | Disposition: A | Discharge status: Home / Self Care | Payer: Medicare Other | Attending: Emergency Medicine | Admitting: Emergency Medicine

## 2018-02-11 ENCOUNTER — Emergency Department (HOSPITAL_COMMUNITY): Payer: Medicare Other

## 2018-02-11 ENCOUNTER — Encounter (HOSPITAL_COMMUNITY): Payer: Self-pay | Admitting: Emergency Medicine

## 2018-02-11 DIAGNOSIS — Z87891 Personal history of nicotine dependence: Secondary | ICD-10-CM | POA: Insufficient documentation

## 2018-02-11 DIAGNOSIS — I129 Hypertensive chronic kidney disease with stage 1 through stage 4 chronic kidney disease, or unspecified chronic kidney disease: Secondary | ICD-10-CM | POA: Insufficient documentation

## 2018-02-11 DIAGNOSIS — N183 Chronic kidney disease, stage 3 (moderate): Secondary | ICD-10-CM | POA: Diagnosis not present

## 2018-02-11 DIAGNOSIS — I4819 Other persistent atrial fibrillation: Secondary | ICD-10-CM | POA: Diagnosis not present

## 2018-02-11 DIAGNOSIS — F0390 Unspecified dementia without behavioral disturbance: Secondary | ICD-10-CM | POA: Insufficient documentation

## 2018-02-11 DIAGNOSIS — E785 Hyperlipidemia, unspecified: Secondary | ICD-10-CM | POA: Insufficient documentation

## 2018-02-11 DIAGNOSIS — I10 Essential (primary) hypertension: Secondary | ICD-10-CM | POA: Diagnosis not present

## 2018-02-11 DIAGNOSIS — Z79899 Other long term (current) drug therapy: Secondary | ICD-10-CM | POA: Insufficient documentation

## 2018-02-11 DIAGNOSIS — Z86718 Personal history of other venous thrombosis and embolism: Secondary | ICD-10-CM | POA: Insufficient documentation

## 2018-02-11 DIAGNOSIS — Z8673 Personal history of transient ischemic attack (TIA), and cerebral infarction without residual deficits: Secondary | ICD-10-CM | POA: Insufficient documentation

## 2018-02-11 DIAGNOSIS — R079 Chest pain, unspecified: Secondary | ICD-10-CM | POA: Diagnosis not present

## 2018-02-11 DIAGNOSIS — E039 Hypothyroidism, unspecified: Secondary | ICD-10-CM | POA: Insufficient documentation

## 2018-02-11 DIAGNOSIS — R0902 Hypoxemia: Secondary | ICD-10-CM | POA: Diagnosis not present

## 2018-02-11 DIAGNOSIS — Z7982 Long term (current) use of aspirin: Secondary | ICD-10-CM | POA: Diagnosis not present

## 2018-02-11 DIAGNOSIS — M79602 Pain in left arm: Secondary | ICD-10-CM | POA: Insufficient documentation

## 2018-02-11 DIAGNOSIS — R4702 Dysphasia: Secondary | ICD-10-CM | POA: Diagnosis not present

## 2018-02-11 DIAGNOSIS — N390 Urinary tract infection, site not specified: Secondary | ICD-10-CM | POA: Insufficient documentation

## 2018-02-11 DIAGNOSIS — R0789 Other chest pain: Secondary | ICD-10-CM | POA: Diagnosis not present

## 2018-02-11 LAB — BRAIN NATRIURETIC PEPTIDE: B Natriuretic Peptide: 211.8 pg/mL — ABNORMAL HIGH (ref 0.0–100.0)

## 2018-02-11 LAB — URINALYSIS, ROUTINE W REFLEX MICROSCOPIC
Bilirubin Urine: NEGATIVE
Glucose, UA: NEGATIVE mg/dL
Ketones, ur: NEGATIVE mg/dL
Nitrite: POSITIVE — AB
Protein, ur: 100 mg/dL — AB
Specific Gravity, Urine: 1.015 (ref 1.005–1.030)
Squamous Epithelial / HPF: >50 — ABNORMAL HIGH (ref 0–5)
WBC, UA: >50 WBC/hpf — ABNORMAL HIGH (ref 0–5)
pH: 6 (ref 5.0–8.0)

## 2018-02-11 LAB — BASIC METABOLIC PANEL
Anion gap: 10 (ref 5–15)
BUN: 19 mg/dL (ref 8–23)
CO2: 20 mmol/L — ABNORMAL LOW (ref 22–32)
Calcium: 8 mg/dL — ABNORMAL LOW (ref 8.9–10.3)
Chloride: 102 mmol/L (ref 98–111)
Creatinine, Ser: 1.5 mg/dL — ABNORMAL HIGH (ref 0.44–1.00)
GFR calc Af Amer: 36 mL/min — ABNORMAL LOW (ref >60)
GFR calc non Af Amer: 31 mL/min — ABNORMAL LOW (ref >60)
Glucose, Bld: 174 mg/dL — ABNORMAL HIGH (ref 70–99)
Potassium: 4.4 mmol/L (ref 3.5–5.1)
Sodium: 132 mmol/L — ABNORMAL LOW (ref 135–145)

## 2018-02-11 LAB — CBC
HCT: 35.1 % — ABNORMAL LOW (ref 36.0–46.0)
Hemoglobin: 11.2 g/dL — ABNORMAL LOW (ref 12.0–15.0)
MCH: 28.1 pg (ref 26.0–34.0)
MCHC: 31.9 g/dL (ref 30.0–36.0)
MCV: 88 fL (ref 80.0–100.0)
Platelets: 244 10*3/uL (ref 150–400)
RBC: 3.99 MIL/uL (ref 3.87–5.11)
RDW: 13.4 % (ref 11.5–15.5)
WBC: 11.4 10*3/uL — ABNORMAL HIGH (ref 4.0–10.5)
nRBC: 0 % (ref 0.0–0.2)

## 2018-02-11 LAB — I-STAT TROPONIN, ED
Troponin i, poc: 0.01 ng/mL (ref 0.00–0.08)
Troponin i, poc: 0.01 ng/mL (ref 0.00–0.08)

## 2018-02-11 MED ORDER — CEPHALEXIN 250 MG PO CAPS
500.0000 mg | ORAL_CAPSULE | Freq: Once | ORAL | Status: AC
  Administered 2018-02-11: 500 mg via ORAL
  Filled 2018-02-11: qty 2

## 2018-02-11 MED ORDER — CEPHALEXIN 500 MG PO CAPS: 500.0000 mg | ORAL_CAPSULE | Freq: Four times a day (QID) | ORAL | 0 refills | Status: DC

## 2018-02-11 NOTE — ED Notes (Signed)
Communicated w/ PA-C Sofia re: pt's downtrending BP. Pt remains acutely responsive w/ no focal complaints. Nontachycardic. MAPs remain between 65-70. Per PA Sofia, awaiting BNP prior to admin of fluids. Will update to any further changes in BP

## 2018-02-11 NOTE — ED Notes (Signed)
Patient verbalizes understanding of discharge instructions. Opportunity for questioning and answers were provided. Armband removed by staff, pt discharged from ED via wheelchair w/ son. All relevant paperwork including DC paperwork and DNR paper handed back to son to transport back to facility w/ pt.

## 2018-02-11 NOTE — ED Provider Notes (Signed)
[facility] EMERGENCY DEPARTMENT Provider Note   CSN: [id number] Arrival date & time: 02/11/18  1537     History   Chief Complaint Chief Complaint  Patient presents with  . Chest Pain    HPI Sara Russell is a 85 y.o. female.  The history is provided by the patient. No language interpreter was used.  Chest Pain   This is a new problem. The current episode started yesterday. The pain is associated with movement. The pain does not radiate. She has tried nothing for the symptoms. The treatment provided no relief.   Pt complained of soreness in her right arm yesterday.  Pt reported left arm and chest soreness today.  Pt sent to Ed for evaluation  Past Medical History:  Diagnosis Date  . Abdominal mass   . AKI (acute kidney injury) ([place]) 09/15/2014  . Atrial fibrillation ([place])   . Colitis, ulcerative ([place])   . Cough    WHITE SPUTUM FOR 2 WEEKS, NO FEVER  . Degenerative joint disease   . Diverticulosis   . DVT (deep venous thrombosis) ([place]) 2009   BOTH LUNGS  . GERD (gastroesophageal reflux disease)   . Hyperlipidemia   . Hyperplastic colon polyp   . Hypertension   . Hypothyroidism   . Iron deficiency anemia    ON IRON AT TIMES  . PE (pulmonary embolism) 2009  . Persistent atrial fibrillation   . Positive H. pylori test 01/20/96  . S/P dilatation of esophageal stricture 1992  . Skin cancer    h/o basal and squamous skin cancer--> removed  . Stroke [facility])     Patient Active Problem List   Diagnosis Date Noted  . Hemiparesis of right dominant side as late effect of nontraumatic intracerebral hemorrhage ([place]) 10/02/2017  . Expressive aphasia 09/22/2017  . Urinary retention 08/28/2017  . Essential hypertension   . Labile blood pressure   . Thalamic hemorrhage ([place]) 08/06/2017  . Hyperlipidemia   . Stage 3 chronic kidney disease ([place])   . At high risk for falls 02/12/2017  . Insomnia 02/12/2017  . Pulmonary nodules/lesions, multiple 01/29/2017    . Hypothyroidism 04/04/2015  . Compression fracture of L1 lumbar vertebra ([facility]) 02/23/2015  . Cholelithiases 02/23/2015  . Encounter for palliative care   . Chronic diastolic congestive heart failure ([place])   . Osteoporosis 05/13/2014  . Gout 08/16/2013  . Left atrial enlargement 01/10/2013  . A-fib ([place]) 12/08/2012  . Rosacea 05/15/2012  . DEGENERATIVE DISC DISEASE, LUMBAR SPINE 07/14/2009  . GERD 07/11/2009  . PERSONAL HX COLONIC POLYPS 07/11/2009  . HYPERCHOLESTEROLEMIA 04/10/2006  . History of pulmonary embolism 04/10/2006  . DIVERTICULOSIS OF COLON 04/10/2006  . ACTINIC KERATOSIS 04/10/2006  . GEN OSTEOARTHROSIS INVOLVING MULTIPLE SITES 04/10/2006    Past Surgical History:  Procedure Laterality Date  . APPENDECTOMY    . BREAST EXCISIONAL BIOPSY Left 1992   benign  . BREAST LUMPECTOMY     benign  . DILATION AND CURETTAGE OF UTERUS     for endometrial polyps  . ENDOSCOPIC RETROGRADE CHOLANGIOPANCREATOGRAPHY (ERCP) WITH PROPOFOL N/A 03/23/2015   Procedure: ENDOSCOPIC RETROGRADE CHOLANGIOPANCREATOGRAPHY (ERCP) WITH PROPOFOL;  Surgeon: Milus Banister, MD;  Location: [facility] ENDOSCOPY;  Service: Endoscopy;  Laterality: N/A;  . JOINT REPLACEMENT    . LOOP RECORDER INSERTION N/A 10/14/2017   Procedure: LOOP RECORDER INSERTION;  Surgeon: Thompson Grayer, MD;  Location: [facility] CV LAB;  Service: Cardiovascular;  Laterality: N/A;  . ORIF ANKLE FRACTURE Left  X 2  . REPLACEMENT TOTAL KNEE     left  . SURGERY DONE TO REMOVE LEFT ANKLE HARDWARE LATER    . TONSILLECTOMY       OB History   No obstetric history on file.      Home Medications    Prior to Admission medications   Medication Sig Start Date End Date Taking? Authorizing Provider  amiodarone (PACERONE) 100 MG tablet Take 1 tablet (100 mg total) by mouth daily. 07/29/17  Yes Hensel, Jamal Collin, MD  aspirin 81 MG tablet Take 1 tablet (81 mg total) by mouth daily. 12/25/17  Yes Venancio Poisson, NP  carvedilol (COREG)  6.25 MG tablet Take 3 tablets (18.75 mg total) by mouth 2 (two) times daily with a meal. 08/27/17  Yes Love, Ivan Anchors, PA-C  docusate sodium (COLACE) 100 MG capsule Take 100 mg by mouth at bedtime.   Yes [provider]  levothyroxine (SYNTHROID, LEVOTHROID) 75 MCG tablet Take 1 tablet (75 mcg total) by mouth daily. 07/29/17  Yes Hensel, Jamal Collin, MD  Melatonin 5 MG TABS Take 5 mg by mouth at bedtime.   Yes [provider]  polyethylene glycol (MIRALAX / GLYCOLAX) packet Take 17 g by mouth daily.   Yes [provider]  potassium chloride 20 MEQ/15ML (10%) SOLN Take 7.5 mLs (10 mEq total) by mouth daily. 08/28/17  Yes Love, Ivan Anchors, PA-C  Simethicone (GAS RELIEF PO) Take 1 tablet by mouth 2 (two) times daily.   Yes [provider]  tiZANidine (ZANAFLEX) 2 MG tablet Take 1 tablet (2 mg total) by mouth 4 (four) times daily -  with meals and at bedtime. 08/26/17  Yes Love, Ivan Anchors, PA-C  cephALEXin (KEFLEX) 500 MG capsule Take 1 capsule (500 mg total) by mouth 4 (four) times daily. 02/11/18   Fransico Meadow, PA-C    Family History Family History  Problem Relation Age of Onset  . Esophageal cancer Brother   . Diverticulitis Brother   . Heart disease Mother   . Stroke Mother   . Dementia Mother   . Heart attack Mother   . Heart disease Father   . Atrial fibrillation Father   . Breast cancer Maternal Aunt   . Colon cancer Neg Hx     Social History Social History   Tobacco Use  . Smoking status: Former Smoker    Packs/day: 0.50    Years: 13.00    Pack years: 6.50    Types: Cigarettes    Start date: 02/12/1948    Last attempt to quit: 02/11/1961    Years since quitting: 57.0  . Smokeless tobacco: Never Used  . Tobacco comment: Denies use of tobacco since 1963  Substance Use Topics  . Alcohol use: Yes    Alcohol/week: 5.0 standard drinks    Types: 5 Glasses of wine per week    Comment: WINE FEW TIMES PER WEEK  . Drug use: No     Allergies     Iohexol; Ivp dye [iodinated diagnostic agents]; Scallops [shellfish allergy]; Shellfish-derived products; Sulfa antibiotics; and Sulfamethoxazole   Review of Systems Review of Systems  Unable to perform ROS: Dementia  Cardiovascular: Positive for chest pain.  All other systems reviewed and are negative.    Physical Exam Updated Vital Signs BP 130/65   Pulse (!) 57   Temp 98.2 F (36.8 C) (Oral)   Resp 16   SpO2 97%   Physical Exam Vitals signs and nursing note reviewed.  Constitutional:  Appearance: She is well-developed.  HENT:     Head: Normocephalic.  Neck:     Musculoskeletal: Normal range of motion.  Cardiovascular:     Rate and Rhythm: Normal rate.     Heart sounds: Normal heart sounds.  Pulmonary:     Effort: Pulmonary effort is normal.     Breath sounds: Normal breath sounds.  Abdominal:     General: Bowel sounds are normal. There is no distension.     Palpations: Abdomen is soft.  Musculoskeletal: Normal range of motion.  Skin:    General: Skin is warm.  Neurological:     General: No focal deficit present.     Mental Status: She is alert and oriented to person, place, and time.      ED Treatments / Results  Labs (all labs ordered are listed, but only abnormal results are displayed) Labs Reviewed  BASIC METABOLIC PANEL - Abnormal; Notable for the following components:      Result Value   Sodium 132 (*)    CO2 20 (*)    Glucose, Bld 174 (*)    Creatinine, Ser 1.50 (*)    Calcium 8.0 (*)    GFR calc non Af Amer 31 (*)    GFR calc Af Amer 36 (*)    All other components within normal limits  CBC - Abnormal; Notable for the following components:   WBC 11.4 (*)    Hemoglobin 11.2 (*)    HCT 35.1 (*)    All other components within normal limits  BRAIN NATRIURETIC PEPTIDE - Abnormal; Notable for the following components:   B Natriuretic Peptide 211.8 (*)    All other components within normal limits  URINALYSIS, ROUTINE W REFLEX MICROSCOPIC -  Abnormal; Notable for the following components:   Color, Urine AMBER (*)    APPearance CLOUDY (*)    Hgb urine dipstick SMALL (*)    Protein, ur 100 (*)    Nitrite POSITIVE (*)    Leukocytes, UA LARGE (*)    WBC, UA >50 (*)    Bacteria, UA FEW (*)    Squamous Epithelial / LPF >50 (*)    Non Squamous Epithelial 0-5 (*)    All other components within normal limits  URINE CULTURE  I-STAT TROPONIN, ED  I-STAT TROPONIN, ED    EKG EKG Interpretation  Date/Time:  Wednesday February 11 2018 15:48:45 EST Ventricular Rate:  73 PR Interval:    QRS Duration: 89 QT Interval:  444 QTC Calculation: 490 R Axis:   -24 Text Interpretation:  Sinus rhythm Prolonged PR interval Left ventricular hypertrophy Nonspecific T wave abnormality Confirmed by Lajean Saver [phone]) on 02/11/2018 4:07:18 PM   Radiology Dg Chest 2 View  Result Date: 02/11/2018 CLINICAL DATA:  Mid chest pain. EXAM: CHEST - 2 VIEW COMPARISON:  January 31, 2017 FINDINGS: The heart size is enlarged. The mediastinal contour is stable. Mild bilateral diffuse increased pulmonary interstitium is identified. There is no focal pneumonia. There is no pleural effusion. There is a hiatal hernia. The visualized skeletal structures are stable. IMPRESSION: Mild congestive heart failure. Electronically Signed   By: Abelardo Diesel M.D.   On: 02/11/2018 16:23    Procedures Procedures (including critical care time)  Medications Ordered in ED Medications  cephALEXin (KEFLEX) capsule 500 mg (500 mg Oral Given 02/11/18 2012)     Initial Impression / Assessment and Plan / ED Course  I have reviewed the triage vital signs and the nursing notes.  Pertinent labs &  imaging results that were available during my care of the patient were reviewed by me and considered in my medical decision making (see chart for details).  Clinical Course as of Feb 12 2136  Wed Feb 11, 2018  6384 Basic metabolic panel(!) [LS]    Clinical Course User Index [LS]  Sidney Ace  MDM   Pt's son at bedside.  He reports pt has been achy.  Urine shows uti.   I will culture urine and treat pt with keflex.  Pt given first dosage here.     Final Clinical Impressions(s) / ED Diagnoses   Final diagnoses:  Urinary tract infection without hematuria, site unspecified    ED Discharge Orders         Ordered    cephALEXin (KEFLEX) 500 MG capsule  4 times daily     02/11/18 1956        An After Visit Summary was printed and given to the patient.    Sidney Ace 02/11/18 2146    Lajean Saver, MD 02/12/18 [phone]

## 2018-02-11 NOTE — Discharge Instructions (Signed)
Return if any problems.

## 2018-02-11 NOTE — ED Notes (Signed)
PA Sofia aware of hypotension to 90s. No further orders at this time, will continue to monitor.

## 2018-02-11 NOTE — ED Triage Notes (Signed)
Pt here from [facility] with c/o chest pain and left arm pain , pt received 1 nitro , zofran ,and 234 asa , b/p was 200/100 after 1 nitro down to 122/67

## 2018-02-11 NOTE — ED Notes (Signed)
Pt. To x-ray .

## 2018-02-12 ENCOUNTER — Encounter: Payer: Self-pay | Admitting: Adult Health

## 2018-02-12 ENCOUNTER — Non-Acute Institutional Stay: Payer: Medicare Other | Admitting: Adult Health

## 2018-02-12 DIAGNOSIS — G8929 Other chronic pain: Secondary | ICD-10-CM | POA: Diagnosis not present

## 2018-02-12 DIAGNOSIS — K649 Unspecified hemorrhoids: Secondary | ICD-10-CM

## 2018-02-12 DIAGNOSIS — M25511 Pain in right shoulder: Secondary | ICD-10-CM | POA: Diagnosis not present

## 2018-02-12 DIAGNOSIS — I610 Nontraumatic intracerebral hemorrhage in hemisphere, subcortical: Secondary | ICD-10-CM | POA: Diagnosis not present

## 2018-02-12 DIAGNOSIS — F0151 Vascular dementia with behavioral disturbance: Secondary | ICD-10-CM | POA: Diagnosis not present

## 2018-02-12 DIAGNOSIS — R4701 Aphasia: Secondary | ICD-10-CM | POA: Diagnosis not present

## 2018-02-12 DIAGNOSIS — I69320 Aphasia following cerebral infarction: Secondary | ICD-10-CM | POA: Diagnosis not present

## 2018-02-12 DIAGNOSIS — F4321 Adjustment disorder with depressed mood: Secondary | ICD-10-CM

## 2018-02-12 NOTE — Progress Notes (Signed)
Location:  Occupational psychologist of Service:  ALF (13) Provider:   Cindi Carbon, ANP [place] [phone]   [name], DO  Patient Care Team: [name], DO as PCP - General (Geriatric Medicine) Benard Rink (Dentistry) [place], [place], [place] (Optometry) Sydnee Levans, MD (Dermatology) Thompson Grayer, MD as Consulting Physician (Cardiology) [name], DO as Consulting Physician (Geriatric Medicine)  Extended Emergency Contact Information Primary Emergency Contact: [name] [place] of [place] Phone: [phone] Mobile Phone: [phone] Relation: Son Secondary Emergency Contact: [name] [place] of [place] Mobile Phone: [phone] Relation: Son  Code Status:  DNR Goals of care: Advanced Directive information Advanced Directives 11/05/2017  Does Patient Have a Medical Advance Directive? Yes  Type of Paramedic of [place];Out of facility DNR (pink MOST or yellow form)  Does patient want to make changes to medical advance directive? No - Patient declined  Copy of [place] in Chart? Yes  Would patient like information on creating a medical advance directive? -  Pre-existing out of facility DNR order (yellow form or pink MOST form) Yellow form placed in chart (order not valid for inpatient use);Pink MOST form placed in chart (order not valid for inpatient use)     Chief Complaint  Patient presents with  . Acute Visit    f/u arm pain    HPI:  Pt is a 85 y.o. female seen today for an acute visit for follow up regarding arm pain. The staff report that she went to the ER with bilateral arm pain on 1/1 and was diagnosed with a UTI and placed on keflex. She did not and does not currently have any urinary symptoms. She is experiencing hemorrhoids that are bothersome and is set to see a surgeon for removal on 1/17.  She recently was tapered off Entocort for  colitis and has not had a recurrence of diarrhea. She has a hx of hemorrhagic CVA with expressive aphasia. The staff report that she is tearful regularly and becomes frustrated easily. She is able to communicate but frequently says the wrong words or can not find the word she wants to say. She reports chronic right shoulder pain with decreased ROM and has had a scapular fracture in the past. No reports of previous pain to the left arm or injury. She does not currently have pain but describes extreme pain to the left arm from the shoulder down to the wrist on 1/1.  This has resovled. She denies numbness or tingling. Denies neck pain. She denied chest pain form my visit but in the ER apparently this was present and an EKG showed no acute changes and troponin was neg. Other labs/tests were WNL except a hiatal hernia on CXR and a WBC of 11.4.   It has been difficult to obtain a history from her given her communication deficits.   The staff and resident feel that she is tearful frequently and increasingly upset about her speech issues and she also reports trouble sleeping.  Past Medical History:  Diagnosis Date  . Abdominal mass   . AKI (acute kidney injury) ([place]) 09/15/2014  . Atrial fibrillation ([place])   . Colitis, ulcerative ([place])   . Cough    WHITE SPUTUM FOR 2 WEEKS, NO FEVER  . Degenerative joint disease   . Diverticulosis   . DVT (deep venous thrombosis) ([place]) 2009   BOTH LUNGS  . GERD (gastroesophageal reflux disease)   . Hyperlipidemia   .  Hyperplastic colon polyp   . Hypertension   . Hypothyroidism   . Iron deficiency anemia    ON IRON AT TIMES  . PE (pulmonary embolism) 2009  . Persistent atrial fibrillation   . Positive H. pylori test 01/20/96  . S/P dilatation of esophageal stricture 1992  . Skin cancer    h/o basal and squamous skin cancer--> removed  . Stroke [facility])    Past Surgical History:  Procedure Laterality Date  . APPENDECTOMY    . BREAST EXCISIONAL BIOPSY Left 1992     benign  . BREAST LUMPECTOMY     benign  . DILATION AND CURETTAGE OF UTERUS     for endometrial polyps  . ENDOSCOPIC RETROGRADE CHOLANGIOPANCREATOGRAPHY (ERCP) WITH PROPOFOL N/A 03/23/2015   Procedure: ENDOSCOPIC RETROGRADE CHOLANGIOPANCREATOGRAPHY (ERCP) WITH PROPOFOL;  Surgeon: Milus Banister, MD;  Location: [facility] END[facility];  Service: Endoscopy;  Laterality: N/A;  . JOINT REPLACEMENT    . LOOP RECORDER INSERTION N/A 10/14/2017   Procedure: LOOP RECORDER INSERTION;  Surgeon: Thompson Grayer, MD;  Location: [facility];  Service: Cardiovascular;  Laterality: N/A;  . ORIF ANKLE FRACTURE Left    X 2  . REPLACEMENT TOTAL KNEE     left  . SURGERY DONE TO REMOVE LEFT ANKLE HARDWARE LATER    . TONSILLECTOMY      Allergies  Allergen Reactions  . Iohexol Hives  . Ivp Dye [Iodinated Diagnostic Agents] Hives    Was able to tolerate Contrast Media for CT scan.  [name] [Shellfish Allergy] Nausea And Vomiting    Projectile vomiting  . Shellfish-Derived Products Nausea And Vomiting  . Sulfa Antibiotics Hives  . Sulfamethoxazole Hives    Outpatient Encounter Medications as of 02/12/2018  Medication Sig  . amiodarone (PACERONE) 100 MG tablet Take 1 tablet (100 mg total) by mouth daily.  Marland Kitchen aspirin 81 MG tablet Take 1 tablet (81 mg total) by mouth daily.  . carvedilol (COREG) 6.25 MG tablet Take 3 tablets (18.75 mg total) by mouth 2 (two) times daily with a meal.  . cephALEXin (KEFLEX) 500 MG capsule Take 1 capsule (500 mg total) by mouth 4 (four) times daily.  Marland Kitchen docusate sodium (COLACE) 100 MG capsule Take 100 mg by mouth at bedtime.  Marland Kitchen levothyroxine (SYNTHROID, LEVOTHROID) 75 MCG tablet Take 1 tablet (75 mcg total) by mouth daily.  . Melatonin 5 MG TABS Take 5 mg by mouth at bedtime.  . polyethylene glycol (MIRALAX / GLYCOLAX) packet Take 17 g by mouth daily.  . potassium chloride 20 MEQ/15ML (10%) SOLN Take 7.5 mLs (10 mEq total) by mouth daily.  . Simethicone (GAS RELIEF PO) Take 1  tablet by mouth 2 (two) times daily.  Marland Kitchen tiZANidine (ZANAFLEX) 2 MG tablet Take 1 tablet (2 mg total) by mouth 4 (four) times daily -  with meals and at bedtime.   No facility-administered encounter medications on file as of 02/12/2018.     Review of Systems  Constitutional: Negative for activity change, appetite change, chills, diaphoresis, fatigue, fever and unexpected weight change.  HENT: Negative for congestion.   Respiratory: Negative for cough, shortness of breath and wheezing.   Cardiovascular: Negative for chest pain, palpitations and leg swelling.  Gastrointestinal: Positive for rectal pain. Negative for abdominal distention, abdominal pain, constipation and diarrhea.  Genitourinary: Negative for difficulty urinating and dysuria.  Musculoskeletal: Positive for arthralgias and gait problem. Negative for back pain, joint swelling and myalgias.  Neurological: Positive for speech difficulty. Negative for dizziness, tremors,  seizures, syncope, facial asymmetry, weakness, light-headedness, numbness and headaches.  Psychiatric/Behavioral: Positive for confusion. Negative for agitation and behavioral problems.    Immunization History  Administered Date(s) Administered  . H1N1 03/04/2008  . Influenza Split 11/12/2010, 11/26/2011  . Influenza Whole 11/26/2006, 11/03/2007, 12/10/2007, 11/16/2008, 11/20/2009  . Influenza,inj,Quad PF,6+ Mos 11/24/2012, 11/29/2013, 10/14/2014, 11/28/2015, 12/04/2016, 12/11/2017  . PPD Test 10/03/2014  . Pneumococcal Conjugate-13 09/10/2013  . Pneumococcal Polysaccharide-23 02/12/2000  . Td 02/11/1998, 04/13/2008  . Zoster 05/08/2006   Pertinent  Health Maintenance Due  Topic Date Due  . INFLUENZA VACCINE  Completed  . DEXA SCAN  Completed  . PNA vac Low Risk Adult  Completed   Fall Risk  11/05/2017 09/24/2017 02/12/2017 01/27/2017 12/19/2016  Falls in the past year? No No No No No  Number falls in past yr: - - - - -  Comment - - - - -   Functional Status  Survey:    Vitals:   02/12/18 1411  BP: 140/70  Pulse: 62  Resp: 18  Temp: 98.3 F (36.8 C)  SpO2: 95%  Weight: 134 lb 12.8 oz (61.1 kg)   Body mass index is 26.33 kg/m. Physical Exam Vitals signs and nursing note reviewed.  Constitutional:      General: She is not in acute distress.    Appearance: She is not diaphoretic.  HENT:     Head: Normocephalic and atraumatic.  Neck:     Musculoskeletal: Full passive range of motion without pain, normal range of motion and neck supple. No edema, neck rigidity, crepitus, pain with movement, spinous process tenderness or muscular tenderness.     Vascular: No JVD.  Cardiovascular:     Rate and Rhythm: Normal rate and regular rhythm.     Heart sounds: No murmur.  Pulmonary:     Effort: Pulmonary effort is normal. No respiratory distress.     Breath sounds: Normal breath sounds. No wheezing.  Musculoskeletal:        General: No swelling, tenderness, deformity or signs of injury.     Right shoulder: She exhibits decreased range of motion and crepitus. She exhibits no tenderness, no bony tenderness, no swelling and no effusion.     Left shoulder: Normal.     Right lower leg: No edema.     Left lower leg: No edema.  Skin:    General: Skin is warm and dry.  Neurological:     Mental Status: She is alert and oriented to person, place, and time.     Labs reviewed: Recent Labs    08/25/17 0802 08/27/17 0632 08/29/17 0640 12/19/17 0100 02/11/18 1548  NA 137 138 137 131* 132*  K 4.1 4.4 3.8 3.3* 4.4  CL 102 107  --   --  102  CO2 26 23  --   --  20*  GLUCOSE 110* 97  --   --  174*  BUN 44* 51* 39* 9 19  CREATININE 1.34* 1.29* 1.2* 1.2* 1.50*  CALCIUM 9.1 8.6*  --   --  8.0*   Recent Labs    08/02/17 1907 08/07/17 0547 12/19/17 0100  AST 21 17 13   ALT 13* 12 8  ALKPHOS 74 71 92  BILITOT 0.6 0.7  --   PROT 6.4* 5.7*  --   ALBUMIN 3.7 2.8*  --    Recent Labs    08/02/17 1907  08/07/17 0547 08/15/17 0652  08/29/17 0640 12/19/17 0100 02/11/18 1548  WBC 7.1  --  9.7 8.2  7.1 9.9 11.4*  NEUTROABS 4.9  --  8.0*  --   --   --   --   HGB 11.9*   < > 11.2* 11.4* 11.6* 11.0* 11.2*  HCT 36.6   < > 33.1* 35.3* 34* 33* 35.1*  MCV 95.8  --  92.5 93.4  --   --  88.0  PLT 204  --  188 301 292 272 244   < > = values in this interval not displayed.   Lab Results  Component Value Date   TSH 2.72 08/29/2017   Lab Results  Component Value Date   HGBA1C 5.3 08/02/2013   Lab Results  Component Value Date   CHOL 89 09/16/2014   HDL 13 (L) 09/16/2014   LDLCALC 40 09/16/2014   LDLDIRECT 85 08/18/2008   TRIG 76 08/05/2017   CHOLHDL 6.8 09/16/2014    Significant Diagnostic Results in last 30 days:  Dg Chest 2 View  Result Date: 02/11/2018 CLINICAL DATA:  Mid chest pain. EXAM: CHEST - 2 VIEW COMPARISON:  January 31, 2017 FINDINGS: The heart size is enlarged. The mediastinal contour is stable. Mild bilateral diffuse increased pulmonary interstitium is identified. There is no focal pneumonia. There is no pleural effusion. There is a hiatal hernia. The visualized skeletal structures are stable. IMPRESSION: Mild congestive heart failure. Electronically Signed   By: Abelardo Diesel M.D.   On: 02/11/2018 16:23    Assessment/Plan  1. Chronic right shoulder pain Scheduled tylenol 650 mg BID   2. Situational depression Remeron 7.5 mg qhs for sleep and mood  3. Hemorrhoids, unspecified hemorrhoid type Prep H cream qhs until apt with surgery  The left arm pain has resolved. If this presents again consider imaging of the neck/shoulder although I could not elicit symptoms in my exam of the neck and shoulder. Also, consider gallbladder/GI issues given history hiatal hernia and cholelithiasis on CT scan in the past.     Family/ staff Communication: discussed with staff/resident, and son [name]  Labs/tests ordered:  Hepatic function panel

## 2018-02-13 DIAGNOSIS — K219 Gastro-esophageal reflux disease without esophagitis: Secondary | ICD-10-CM | POA: Diagnosis not present

## 2018-02-13 DIAGNOSIS — Z79899 Other long term (current) drug therapy: Secondary | ICD-10-CM | POA: Diagnosis not present

## 2018-02-13 DIAGNOSIS — N390 Urinary tract infection, site not specified: Secondary | ICD-10-CM | POA: Diagnosis not present

## 2018-02-13 LAB — URINE CULTURE: Culture: >=100000 — AB

## 2018-02-13 LAB — HEPATIC FUNCTION PANEL
ALT: 9 (ref 7–35)
AST: 13 (ref 13–35)
Alkaline Phosphatase: 79 (ref 25–125)
Bilirubin, Total: 0.3

## 2018-02-14 ENCOUNTER — Telehealth: Payer: Self-pay

## 2018-02-14 NOTE — Telephone Encounter (Signed)
Post ED Visit - Positive Culture Follow-up  Culture report reviewed by antimicrobial stewardship pharmacist:  []  Elenor Quinones, Pharm.D. []  Heide Guile, Pharm.D., BCPS AQ-ID []  Parks Neptune, Pharm.D., BCPS []  Alycia Rossetti, Pharm.D., BCPS []  [place], [place].D., BCPS, AAHIVP []  Legrand Como, Pharm.D., BCPS, AAHIVP []  Salome Arnt, PharmD, BCPS []  Johnnette Gourd, PharmD, BCPS []  Hughes Better, PharmD, BCPS []  Leeroy Cha, PharmD [facility] [place] Pharm D Positive urine culture Treated with Cephalexin, organism sensitive to the same and no further patient follow-up is required at this time.  Genia Del 02/14/2018, 10:05 AM

## 2018-02-17 ENCOUNTER — Encounter: Payer: Self-pay | Admitting: Internal Medicine

## 2018-02-18 DIAGNOSIS — D649 Anemia, unspecified: Secondary | ICD-10-CM | POA: Diagnosis not present

## 2018-02-18 DIAGNOSIS — Z79899 Other long term (current) drug therapy: Secondary | ICD-10-CM | POA: Diagnosis not present

## 2018-02-18 LAB — BASIC METABOLIC PANEL
BUN: 19 (ref 4–21)
Creatinine: 1.6 — AB (ref 0.5–1.1)
Potassium: 4 (ref 3.4–5.3)
Sodium: 132 — AB (ref 137–147)

## 2018-02-18 LAB — CBC AND DIFFERENTIAL
HCT: 31 — AB (ref 36–46)
Hemoglobin: 10.5 — AB (ref 12.0–16.0)
Neutrophils Absolute: 26
Platelets: 279 (ref 150–399)
WBC: 27.2

## 2018-02-19 ENCOUNTER — Encounter: Payer: Self-pay | Admitting: Adult Health

## 2018-02-19 ENCOUNTER — Non-Acute Institutional Stay (SKILLED_NURSING_FACILITY): Payer: Medicare Other | Admitting: Adult Health

## 2018-02-19 DIAGNOSIS — R05 Cough: Secondary | ICD-10-CM | POA: Diagnosis not present

## 2018-02-19 DIAGNOSIS — D72825 Bandemia: Secondary | ICD-10-CM

## 2018-02-19 DIAGNOSIS — E860 Dehydration: Secondary | ICD-10-CM | POA: Diagnosis not present

## 2018-02-19 DIAGNOSIS — F0151 Vascular dementia with behavioral disturbance: Secondary | ICD-10-CM | POA: Diagnosis not present

## 2018-02-19 DIAGNOSIS — I610 Nontraumatic intracerebral hemorrhage in hemisphere, subcortical: Secondary | ICD-10-CM | POA: Diagnosis not present

## 2018-02-19 DIAGNOSIS — R319 Hematuria, unspecified: Secondary | ICD-10-CM | POA: Diagnosis not present

## 2018-02-19 DIAGNOSIS — J189 Pneumonia, unspecified organism: Secondary | ICD-10-CM | POA: Diagnosis not present

## 2018-02-19 DIAGNOSIS — R300 Dysuria: Secondary | ICD-10-CM | POA: Diagnosis not present

## 2018-02-19 DIAGNOSIS — I69320 Aphasia following cerebral infarction: Secondary | ICD-10-CM | POA: Diagnosis not present

## 2018-02-19 DIAGNOSIS — R197 Diarrhea, unspecified: Secondary | ICD-10-CM | POA: Diagnosis not present

## 2018-02-19 DIAGNOSIS — R4701 Aphasia: Secondary | ICD-10-CM | POA: Diagnosis not present

## 2018-02-19 DIAGNOSIS — K449 Diaphragmatic hernia without obstruction or gangrene: Secondary | ICD-10-CM | POA: Diagnosis not present

## 2018-02-19 NOTE — Progress Notes (Signed)
Reviewed and agree with documentation and assessment and plan. K. Veena Corrinne Benegas , MD   

## 2018-02-19 NOTE — Progress Notes (Signed)
Location:  Occupational psychologist of Service:  SNF (31) Provider:   Cindi Carbon, ANP [place] [phone]  [name], DO  Patient Care Team: [name], DO as PCP - General (Geriatric Medicine) Benard Rink (Dentistry) [place], [place], [place] (Optometry) Sydnee Levans, MD (Dermatology) Thompson Grayer, MD as Consulting Physician (Cardiology) [name], DO as Consulting Physician (Geriatric Medicine)  Extended Emergency Contact Information Primary Emergency Contact: [name] [place] of [place] Phone: [phone] Mobile Phone: [phone] Relation: Son Secondary Emergency Contact: [name] [place] of [place] Mobile Phone: [phone] Relation: Son  Code Status:  DNR Goals of care: Advanced Directive information Advanced Directives 11/05/2017  Does Patient Have a Medical Advance Directive? Yes  Type of Paramedic of [place];Out of facility DNR (pink MOST or yellow form)  Does patient want to make changes to medical advance directive? No - Patient declined  Copy of [place] in Chart? Yes  Would patient like information on creating a medical advance directive? -  Pre-existing out of facility DNR order (yellow form or pink MOST form) Yellow form placed in chart (order not valid for inpatient use);Pink MOST form placed in chart (order not valid for inpatient use)     Chief Complaint  Patient presents with  . Acute Visit    loose stools and weakness    HPI:  Pt is a 85 y.o. female seen today for an acute visit for diarrhea and leukocytosis.  She has a hx of hemorrhagic CVA with expressive aphasia, Afib, colitis, DVT, diastolic CHF, hypothyroidism, HTN, OP, compression fracture, DDD, right shoulder pain, and CKD. The staff report on the evening of 1/7 she began having diarrhea. ON 1/8 her BP was 90/50 and she felt weaker and so 1 liter of IVF was ordered,  she was placed on contact prec and stool studies for cdiff were ordered. She did not have abd pain , nausea or vomiting recorded in matrix as a complaint. She was moved to the rehab from [place] for additional monitoring. She completed 7 days of keflex for a UTI diagnosed in the ER on 1/8 (100,000 colonies of Klebsiella).  Labs were ordered for this acute illness at [facility] and showed a bump in Cr from 1.2 to 1.6.  Na 132, WBC of 27,000 with a left shift. Other labs were unremarkable. Unfortunately the stool specimen was not sent yet. She has a hx of colitis and just completed a budesonide taper in Dec. Afterwards she developed constipation and hemorroids which were bother some and she is on the schedule for surgical removal later this month. Laxatives are currently being held. She did not void for two shifts. A urine was obtained this morning and sent for UA C and S. Dip showed the following  neg for nitrites/blood but had moderate leukocytes.  The bladder was drained but only had 150 cc.  She denied abd pain for my visit. She pointed to her across her chest and shoulder area and says she feels anxious. She has a hx of a large hiatal hernia.. It is difficult to obtain a proper history from her due to her expressive aphasia and underlying memory loss. She denied any radiating chest pain or pain with exertion. This was addressed in the ER on 1/2 where her troponin and EKG were neg. Noted history of cholelithiasis with normal hepatic function panel on 1/3.    CXR returned on 1/9 showing LLL pna, hiatal  hernia, and soft tissue density on the left lung base.   Past Medical History:  Diagnosis Date  . Abdominal mass   . AKI (acute kidney injury) ([place]) 09/15/2014  . Atrial fibrillation ([place])   . Colitis, ulcerative ([place])   . Cough    WHITE SPUTUM FOR 2 WEEKS, NO FEVER  . Degenerative joint disease   . Diverticulosis   . DVT (deep venous thrombosis) ([place]) 2009   BOTH LUNGS  . GERD (gastroesophageal reflux  disease)   . Hyperlipidemia   . Hyperplastic colon polyp   . Hypertension   . Hypothyroidism   . Iron deficiency anemia    ON IRON AT TIMES  . PE (pulmonary embolism) 2009  . Persistent atrial fibrillation   . Positive H. pylori test 01/20/96  . S/P dilatation of esophageal stricture 1992  . Skin cancer    h/o basal and squamous skin cancer--> removed  . Stroke [facility])    Past Surgical History:  Procedure Laterality Date  . APPENDECTOMY    . BREAST EXCISIONAL BIOPSY Left 1992   benign  . BREAST LUMPECTOMY     benign  . DILATION AND CURETTAGE OF UTERUS     for endometrial polyps  . ENDOSCOPIC RETROGRADE CHOLANGIOPANCREATOGRAPHY (ERCP) WITH PROPOFOL N/A 03/23/2015   Procedure: ENDOSCOPIC RETROGRADE CHOLANGIOPANCREATOGRAPHY (ERCP) WITH PROPOFOL;  Surgeon: Milus Banister, MD;  Location: [facility] ENDOSCOPY;  Service: Endoscopy;  Laterality: N/A;  . JOINT REPLACEMENT    . LOOP RECORDER INSERTION N/A 10/14/2017   Procedure: LOOP RECORDER INSERTION;  Surgeon: Thompson Grayer, MD;  Location: [place] [facility];  Service: Cardiovascular;  Laterality: N/A;  . ORIF ANKLE FRACTURE Left    X 2  . REPLACEMENT TOTAL KNEE     left  . SURGERY DONE TO REMOVE LEFT ANKLE HARDWARE LATER    . TONSILLECTOMY      Allergies  Allergen Reactions  . Iohexol Hives  . Ivp Dye [Iodinated Diagnostic Agents] Hives    Was able to tolerate Contrast Media for CT scan.  [name] [Shellfish Allergy] Nausea And Vomiting    Projectile vomiting  . Shellfish-Derived Products Nausea And Vomiting  . Sulfa Antibiotics Hives  . Sulfamethoxazole Hives    Outpatient Encounter Medications as of 02/19/2018  Medication Sig  . acetaminophen (TYLENOL) 325 MG tablet Take 650 mg by mouth 2 (two) times daily.  Marland Kitchen atorvastatin (LIPITOR) 10 MG tablet Take 10 mg by mouth daily.  . phenylephrine-shark liver oil-mineral oil-petrolatum (PREPARATION H) 0.25-3-14-71.9 % rectal ointment Place 1 application rectally at bedtime.  Marland Kitchen amiodarone  (PACERONE) 100 MG tablet Take 1 tablet (100 mg total) by mouth daily.  Marland Kitchen aspirin 81 MG tablet Take 1 tablet (81 mg total) by mouth daily.  . carvedilol (COREG) 6.25 MG tablet Take 3 tablets (18.75 mg total) by mouth 2 (two) times daily with a meal.  . levothyroxine (SYNTHROID, LEVOTHROID) 75 MCG tablet Take 1 tablet (75 mcg total) by mouth daily.  . Melatonin 5 MG TABS Take 5 mg by mouth at bedtime.  . potassium chloride 20 MEQ/15ML (10%) SOLN Take 7.5 mLs (10 mEq total) by mouth daily.  . Simethicone (GAS RELIEF PO) Take 1 tablet by mouth 2 (two) times daily.  Marland Kitchen tiZANidine (ZANAFLEX) 2 MG tablet Take 1 tablet (2 mg total) by mouth 4 (four) times daily -  with meals and at bedtime.  . [DISCONTINUED] cephALEXin (KEFLEX) 500 MG capsule Take 1 capsule (500 mg total) by mouth 4 (four) times daily.  . [  DISCONTINUED] docusate sodium (COLACE) 100 MG capsule Take 100 mg by mouth at bedtime.  . [DISCONTINUED] polyethylene glycol (MIRALAX / GLYCOLAX) packet Take 17 g by mouth daily.   No facility-administered encounter medications on file as of 02/19/2018.     Review of Systems  Constitutional: Positive for activity change, appetite change and fatigue. Negative for chills, diaphoresis, fever and unexpected weight change.  HENT: Negative for congestion.   Respiratory: Negative for cough, shortness of breath and wheezing.   Cardiovascular: Negative for palpitations and leg swelling.       Points across the chest/shoulder area and says she feels anxious.   Gastrointestinal: Positive for diarrhea and rectal pain (hemorrhoid). Negative for abdominal distention, abdominal pain, anal bleeding, blood in stool, constipation, nausea and vomiting.  Genitourinary: Positive for decreased urine volume. Negative for difficulty urinating, dysuria, flank pain, frequency, hematuria, pelvic pain, urgency, vaginal bleeding and vaginal discharge.  Musculoskeletal: Negative for arthralgias, back pain, gait problem (uses  walker), joint swelling and myalgias.  Neurological: Positive for speech difficulty. Negative for dizziness, tremors, seizures, syncope, facial asymmetry, weakness, light-headedness, numbness and headaches.  Psychiatric/Behavioral: Positive for agitation (upset about her difficulty being able to communicate) and confusion. Negative for behavioral problems.    Immunization History  Administered Date(s) Administered  . H1N1 03/04/2008  . Influenza Split 11/12/2010, 11/26/2011  . Influenza Whole 11/26/2006, 11/03/2007, 12/10/2007, 11/16/2008, 11/20/2009  . Influenza,inj,Quad PF,6+ Mos 11/24/2012, 11/29/2013, 10/14/2014, 11/28/2015, 12/04/2016, 12/11/2017  . PPD Test 10/03/2014  . Pneumococcal Conjugate-13 09/10/2013  . Pneumococcal Polysaccharide-23 02/12/2000  . Td 02/11/1998, 04/13/2008  . Zoster 05/08/2006   Pertinent  Health Maintenance Due  Topic Date Due  . INFLUENZA VACCINE  Completed  . DEXA SCAN  Completed  . PNA vac Low Risk Adult  Completed   Fall Risk  11/05/2017 09/24/2017 02/12/2017 01/27/2017 12/19/2016  Falls in the past year? No No No No No  Number falls in past yr: - - - - -  Comment - - - - -   Functional Status Survey:    Vitals:   02/19/18 1008  BP: 136/67  Pulse: 62  Resp: 16  Temp: (!) 96.5 F (35.8 C)   There is no height or weight on file to calculate BMI. Physical Exam Constitutional:      General: She is not in acute distress.    Appearance: She is not diaphoretic.  HENT:     Head: Normocephalic and atraumatic.     Right Ear: Tympanic membrane, ear canal and external ear normal. There is no impacted cerumen.     Left Ear: Tympanic membrane, ear canal and external ear normal. There is no impacted cerumen.  Neck:     Vascular: No JVD.  Cardiovascular:     Rate and Rhythm: Normal rate and regular rhythm.     Heart sounds: No murmur.  Pulmonary:     Effort: Pulmonary effort is normal. No respiratory distress.     Breath sounds: Rales (RLL)  present. No wheezing.  Abdominal:     General: Bowel sounds are normal. There is no distension.     Palpations: Abdomen is soft. There is no mass.     Tenderness: There is abdominal tenderness (s/p and Left CVA area). There is left CVA tenderness. There is no guarding.  Skin:    General: Skin is warm and dry.  Neurological:     Mental Status: She is alert and oriented to person, place, and time.     Comments: Word finding  issues  Psychiatric:     Comments: Irritable, crying about not being able to find her words     Labs reviewed: Recent Labs    08/25/17 0802 08/27/17 0632  12/19/17 0100 02/11/18 1548 02/18/18  NA 137 138   < > 131* 132* 132*  K 4.1 4.4   < > 3.3* 4.4 4.0  CL 102 107  --   --  102  --   CO2 26 23  --   --  20*  --   GLUCOSE 110* 97  --   --  174*  --   BUN 44* 51*   < > 9 19 19   CREATININE 1.34* 1.29*   < > 1.2* 1.50* 1.6*  CALCIUM 9.1 8.6*  --   --  8.0*  --    < > = values in this interval not displayed.   Recent Labs    08/02/17 1907 08/07/17 0547 12/19/17 0100 02/13/18 0700  AST 21 17 13 13   ALT 13* 12 8 9   ALKPHOS 74 71 92 79  BILITOT 0.6 0.7  --   --   PROT 6.4* 5.7*  --   --   ALBUMIN 3.7 2.8*  --   --    Recent Labs    08/02/17 1907  08/07/17 0547 08/15/17 0652  12/19/17 0100 02/11/18 1548 02/18/18  WBC 7.1  --  9.7 8.2   < > 9.9 11.4* 27.2  NEUTROABS 4.9  --  8.0*  --   --   --   --  26  HGB 11.9*   < > 11.2* 11.4*   < > 11.0* 11.2* 10.5*  HCT 36.6   < > 33.1* 35.3*   < > 33* 35.1* 31*  MCV 95.8  --  92.5 93.4  --   --  88.0  --   PLT 204  --  188 301   < > 272 244 279   < > = values in this interval not displayed.   Lab Results  Component Value Date   TSH 2.72 08/29/2017   Lab Results  Component Value Date   HGBA1C 5.3 08/02/2013   Lab Results  Component Value Date   CHOL 89 09/16/2014   HDL 13 (L) 09/16/2014   LDLCALC 40 09/16/2014   LDLDIRECT 85 08/18/2008   TRIG 76 08/05/2017   CHOLHDL 6.8 09/16/2014     Significant Diagnostic Results in last 30 days:  Dg Chest 2 View  Result Date: 02/11/2018 CLINICAL DATA:  Mid chest pain. EXAM: CHEST - 2 VIEW COMPARISON:  January 31, 2017 FINDINGS: The heart size is enlarged. The mediastinal contour is stable. Mild bilateral diffuse increased pulmonary interstitium is identified. There is no focal pneumonia. There is no pleural effusion. There is a hiatal hernia. The visualized skeletal structures are stable. IMPRESSION: Mild congestive heart failure. Electronically Signed   By: Abelardo Diesel M.D.   On: 02/11/2018 16:23    Assessment/Plan  1. HCAP (healthcare-associated pneumonia) Initially placed on cipro for UTI (bladder tenderness with elevated WBC and WBCs noted in urine dip) but later changed to levaquin due to the above findings.   2. Bandemia Due to #1 Stool studies are pending but diarrhea has improved UA C and S pending but just completed keflex  3. Dehydration Improved BP Give one more liter of fluid due to bump in Cr and decreased urine out put.  Monitor VS and output qshift  4. Diarrhea, unspecified type Slowing down with hx  of colitis Await stool study results Abd not distended with normal bowel sounds and no fever Continue to hold laxatives  5. Hiatal hernia With complaints that may be GERD related Begin Protonix 40 mg qd prior to BF   Family/ staff Communication: called and left message with her son, discussed with nurse on the unit  Labs/tests ordered:  UA C and S, CXR, CMP and CBC In the am, stool studies

## 2018-02-20 DIAGNOSIS — D649 Anemia, unspecified: Secondary | ICD-10-CM | POA: Diagnosis not present

## 2018-02-20 DIAGNOSIS — E860 Dehydration: Secondary | ICD-10-CM | POA: Diagnosis not present

## 2018-02-20 LAB — BASIC METABOLIC PANEL
BUN: 21 (ref 4–21)
Creatinine: 1.5 — AB (ref 0.5–1.1)
Glucose: 92
Potassium: 4.2 (ref 3.4–5.3)
Sodium: 131 — AB (ref 137–147)

## 2018-02-20 LAB — CBC AND DIFFERENTIAL
HCT: 34 — AB (ref 36–46)
Hemoglobin: 11.6 — AB (ref 12.0–16.0)
Platelets: 255 (ref 150–399)
WBC: 7.3

## 2018-02-20 LAB — HEPATIC FUNCTION PANEL
ALT: 28 (ref 7–35)
AST: 35 (ref 13–35)
Alkaline Phosphatase: 233 — AB (ref 25–125)
Bilirubin, Total: 0.3

## 2018-02-23 ENCOUNTER — Ambulatory Visit (INDEPENDENT_AMBULATORY_CARE_PROVIDER_SITE_OTHER): Payer: Medicare Other

## 2018-02-23 DIAGNOSIS — I639 Cerebral infarction, unspecified: Secondary | ICD-10-CM

## 2018-02-23 DIAGNOSIS — R4701 Aphasia: Secondary | ICD-10-CM | POA: Diagnosis not present

## 2018-02-23 DIAGNOSIS — E860 Dehydration: Secondary | ICD-10-CM | POA: Diagnosis not present

## 2018-02-23 DIAGNOSIS — R197 Diarrhea, unspecified: Secondary | ICD-10-CM | POA: Diagnosis not present

## 2018-02-23 DIAGNOSIS — A048 Other specified bacterial intestinal infections: Secondary | ICD-10-CM | POA: Diagnosis not present

## 2018-02-23 DIAGNOSIS — F0151 Vascular dementia with behavioral disturbance: Secondary | ICD-10-CM | POA: Diagnosis not present

## 2018-02-23 DIAGNOSIS — Z79899 Other long term (current) drug therapy: Secondary | ICD-10-CM | POA: Diagnosis not present

## 2018-02-23 DIAGNOSIS — I69320 Aphasia following cerebral infarction: Secondary | ICD-10-CM | POA: Diagnosis not present

## 2018-02-23 DIAGNOSIS — A0472 Enterocolitis due to Clostridium difficile, not specified as recurrent: Secondary | ICD-10-CM | POA: Diagnosis not present

## 2018-02-23 DIAGNOSIS — I610 Nontraumatic intracerebral hemorrhage in hemisphere, subcortical: Secondary | ICD-10-CM | POA: Diagnosis not present

## 2018-02-23 LAB — HEPATIC FUNCTION PANEL
ALT: 34 (ref 7–35)
AST: 40 — AB (ref 13–35)
Alkaline Phosphatase: 385 — AB (ref 25–125)
Bilirubin, Total: 0.3

## 2018-02-24 ENCOUNTER — Non-Acute Institutional Stay (SKILLED_NURSING_FACILITY): Payer: Medicare Other | Admitting: Internal Medicine

## 2018-02-24 ENCOUNTER — Encounter: Payer: Self-pay | Admitting: Internal Medicine

## 2018-02-24 DIAGNOSIS — R197 Diarrhea, unspecified: Secondary | ICD-10-CM | POA: Diagnosis not present

## 2018-02-24 DIAGNOSIS — E860 Dehydration: Secondary | ICD-10-CM | POA: Diagnosis not present

## 2018-02-24 DIAGNOSIS — J189 Pneumonia, unspecified organism: Secondary | ICD-10-CM | POA: Diagnosis not present

## 2018-02-24 DIAGNOSIS — N6310 Unspecified lump in the right breast, unspecified quadrant: Secondary | ICD-10-CM

## 2018-02-24 DIAGNOSIS — F0151 Vascular dementia with behavioral disturbance: Secondary | ICD-10-CM | POA: Diagnosis not present

## 2018-02-24 DIAGNOSIS — R4701 Aphasia: Secondary | ICD-10-CM

## 2018-02-24 DIAGNOSIS — I610 Nontraumatic intracerebral hemorrhage in hemisphere, subcortical: Secondary | ICD-10-CM | POA: Diagnosis not present

## 2018-02-24 DIAGNOSIS — I69320 Aphasia following cerebral infarction: Secondary | ICD-10-CM | POA: Diagnosis not present

## 2018-02-24 NOTE — Progress Notes (Signed)
Patient ID: MCKINZEY ENTWISTLE, female   DOB: 10-14-1930, 85 y.o.   MRN: [id number]  Provider:  Rexene Edison. Mariea Clonts, D.O., C.M.D. Location:  [facility] Room Number: [place] of Service:  SNF (31)  PCP: [name], DO Patient Care Team: [name], DO as PCP - General (Geriatric Medicine) Benard Rink (Dentistry) [place], [place], [place] (Optometry) Sydnee Levans, MD (Dermatology) Thompson Grayer, MD as Consulting Physician (Cardiology) [name], DO as Consulting Physician (Geriatric Medicine)  Extended Emergency Contact Information Primary Emergency Contact: [name] [place] Phone: [phone] Mobile Phone: [phone] Relation: Son Secondary Emergency Contact: [name] [place] Mobile Phone: [phone] Relation: Son  Code Status: DNR, MOST Goals of Care: Advanced Directive information Advanced Directives 02/24/2018  Does Patient Have a Medical Advance Directive? Yes  Type of Paramedic of [place];Living will;Out of facility DNR (pink MOST or yellow form)  Does patient want to make changes to medical advance directive? No - Patient declined  Copy of [place] in Chart? Yes - validated most recent copy scanned in chart (See row information)  Would patient like information on creating a medical advance directive? -  Pre-existing out of facility DNR order (yellow form or pink MOST form) Yellow form placed in chart (order not valid for inpatient use);Pink MOST form placed in chart (order not valid for inpatient use)   Chief Complaint  Patient presents with  . New Admit To SNF    Rehab admission    HPI: Patient is a 85 y.o. female with h/o stroke with aphasia, microscopic colitis (prior steroid use for this ) seen today for admission to [facility] rehab due to intractable diarrhea, malaise, leukocytosis and acute renal failure.  She stayed  in bed for the last day before transfer.  Staff were quite concerned about her poor intake.  Due to abx for "UTI" diagnosed in the ED (keflex x 7d), C diff testing was ordered.  .  A liter of IVFs was ordered due to hypotension, weakness and renal failure with cr bump to 1.6 from 1.2.  Laxatives were held.  It was unclear if she was voiding or not.  A urine was sent off again after positive dip in rehab.  She's had abnormal liver tests as well.  GGT did confirm biliary pathology eventually when it returned.    CXR 1/9 showed LLL pneumonia, hiatal hernia and soft tissue density at left lung base that may have been in breast.  Oddly, pt reports feeling something in her right breast where there is a palpable mass in the lower outer quadrant.  She says it's been there x years and she does not want to do anything about it.    Previously, MOST was completed as follows:  DNR, full scope of treatment (which does not make sense with DNR), antibiotics, fluids and no feeding tube 11/05/17.  There are plans for another discussion with revision of this form based on [name]'s wishes.   She is back to baseline today.  No longer having loose stools.  She's eating well.  She feels good and ready to return to AL.   We will follow up in clinic and get the MOST done. Past Medical History:  Diagnosis Date  . Abdominal mass   . AKI (acute kidney injury) ([place]) 09/15/2014  . Atrial fibrillation ([place])   . Colitis, ulcerative ([place])   . Cough    WHITE SPUTUM  FOR 2 WEEKS, NO FEVER  . Degenerative joint disease   . Diverticulosis   . DVT (deep venous thrombosis) ([place]) 2009   BOTH LUNGS  . GERD (gastroesophageal reflux disease)   . Hyperlipidemia   . Hyperplastic colon polyp   . Hypertension   . Hypothyroidism   . Iron deficiency anemia    ON IRON AT TIMES  . PE (pulmonary embolism) 2009  . Persistent atrial fibrillation   . Positive H. pylori test 01/20/96  . S/P dilatation of esophageal stricture 1992  . Skin  cancer    h/o basal and squamous skin cancer--> removed  . Stroke [facility])    Past Surgical History:  Procedure Laterality Date  . APPENDECTOMY    . BREAST EXCISIONAL BIOPSY Left 1992   benign  . BREAST LUMPECTOMY     benign  . DILATION AND CURETTAGE OF UTERUS     for endometrial polyps  . ENDOSCOPIC RETROGRADE CHOLANGIOPANCREATOGRAPHY (ERCP) WITH PROPOFOL N/A 03/23/2015   Procedure: ENDOSCOPIC RETROGRADE CHOLANGIOPANCREATOGRAPHY (ERCP) WITH PROPOFOL;  Surgeon: Milus Banister, MD;  Location: [facility];  Service: Endoscopy;  Laterality: N/A;  . JOINT REPLACEMENT    . LOOP RECORDER INSERTION N/A 10/14/2017   Procedure: LOOP RECORDER INSERTION;  Surgeon: Thompson Grayer, MD;  Location: [facility];  Service: Cardiovascular;  Laterality: N/A;  . ORIF ANKLE FRACTURE Left    X 2  . REPLACEMENT TOTAL KNEE     left  . SURGERY DONE TO REMOVE LEFT ANKLE HARDWARE LATER    . TONSILLECTOMY      reports that she quit smoking about 57 years ago. Her smoking use included cigarettes. She started smoking about 70 years ago. She has a 6.50 pack-year smoking history. She has never used smokeless tobacco. She reports current alcohol use of about 5.0 standard drinks of alcohol per week. She reports that she does not use drugs. Social History   Socioeconomic History  . Marital status: Widowed    Spouse name: Not on file  . Number of children: 3  . Years of education: college  . Highest education level: Bachelor's degree (e.g., BA, AB, BS)  Occupational History  . Occupation: retired Optician, dispensing: RETIRED  Social Needs  . Financial resource strain: Not on file  . Food insecurity:    Worry: Never true    Inability: Not on file  . Transportation needs:    Medical: No    Non-medical: No  Tobacco Use  . Smoking status: Former Smoker    Packs/day: 0.50    Years: 13.00    Pack years: 6.50    Types: Cigarettes    Start date: 02/12/1948    Last attempt to quit: 02/11/1961    Years since  quitting: 57.0  . Smokeless tobacco: Never Used  . Tobacco comment: Denies use of tobacco since 1963  Substance and Sexual Activity  . Alcohol use: Yes    Alcohol/week: 5.0 standard drinks    Types: 5 Glasses of wine per week    Comment: WINE FEW TIMES PER WEEK  . Drug use: No  . Sexual activity: Never  Lifestyle  . Physical activity:    Days per week: Not on file    Minutes per session: Not on file  . Stress: Not on file  Relationships  . Social connections:    Talks on phone: Not on file    Gets together: Not on file    Attends religious service: Not on file  Active member of club or organization: Not on file    Attends meetings of clubs or organizations: Not on file    Relationship status: Not on file  . Intimate partner violence:    Fear of current or ex partner: Not on file    Emotionally abused: Not on file    Physically abused: Not on file    Forced sexual activity: Not on file  Other Topics Concern  . Not on file  Social History Narrative   Health Care POA:    Emergency Contact: son, Zabdi Mis (c) [phone] [name])   Diet: Pt has a varied diet of protein, starch, vegetables.   Exercise: Pt has no regular exercise plan because of pain in back.   Seatbelts: Pt reports wearing seatbelt when in vehicle.   Nancy Fetter Exposure/Protection: Pt reports wearing sun screen.   Hobbies: reading, traveling, eating out.      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 [place], RN, BSN                                                                                                     Functional Status Survey:    Family History  Problem Relation Age of Onset  . Esophageal cancer Brother   . Diverticulitis Brother   . Heart disease Mother   . Stroke Mother   . Dementia Mother   . Heart attack Mother   . Heart disease Father   . Atrial fibrillation Father   . Breast cancer Maternal Aunt   . Colon cancer Neg Hx     Health Maintenance  Topic Date Due  . TETANUS/TDAP  04/14/2018  . INFLUENZA VACCINE  Completed  . DEXA SCAN  Completed  . PNA vac Low Risk Adult  Completed    Allergies  Allergen Reactions  . Iohexol Hives  . Ivp Dye [Iodinated Diagnostic Agents] Hives    Was able to tolerate Contrast Media for CT scan.  [name] [Shellfish Allergy] Nausea And Vomiting    Projectile vomiting  . Shellfish-Derived Products Nausea And Vomiting  . Sulfa Antibiotics Hives  . Sulfamethoxazole Hives  Outpatient Encounter Medications as of 02/24/2018  Medication Sig  . acetaminophen (TYLENOL) 325 MG tablet Take 650 mg by mouth 2 (two) times daily.  Marland Kitchen amiodarone (PACERONE) 100 MG tablet Take 1 tablet (100 mg total) by mouth daily.  Marland Kitchen aspirin 81 MG tablet Take 1 tablet (81 mg total) by mouth daily.  Marland Kitchen atorvastatin (LIPITOR) 10 MG tablet Take 10 mg by mouth daily.  . carvedilol (COREG) 6.25 MG tablet Take 3 tablets (18.75 mg total) by mouth 2 (two) times daily with a meal.  . docusate sodium (COLACE) 100 MG capsule Take 100 mg by mouth daily.  . Hypromellose (GONIOVISC) 2 % SOLN Place 1 drop into both eyes  daily as needed.  Marland Kitchen levofloxacin (LEVAQUIN) 500 MG tablet Take 500 mg by mouth daily. 500 mg on day 1, then 250 mg on day 2-7.  Renally dosed  . levothyroxine (SYNTHROID, LEVOTHROID) 75 MCG tablet Take 1 tablet (75 mcg total) by mouth daily.  . Lidocaine, Anorectal, (RECTICARE) 5 % CREA Apply topically as needed.  . Melatonin 5 MG TABS Take 5 mg by mouth at bedtime.  . pantoprazole (PROTONIX) 40 MG tablet Take 40 mg by mouth daily.  . phenylephrine-shark liver oil-mineral oil-petrolatum (PREPARATION H) 0.25-3-14-71.9 % rectal ointment Place 1 application rectally at bedtime.  . potassium chloride 20 MEQ/15ML (10%) SOLN Take 7.5 mLs (10 mEq total) by mouth daily.  Marland Kitchen saccharomyces boulardii (FLORASTOR) 250 MG capsule Take 250 mg by mouth 2 (two) times daily.  . Simethicone (GAS RELIEF PO) Take 1 tablet by mouth 2 (two) times daily.  Marland Kitchen tiZANidine (ZANAFLEX) 2 MG tablet Take 1 tablet (2 mg total) by mouth 4 (four) times daily -  with meals and at bedtime.  . WITCH HAZEL EX Apply topically as needed.   No facility-administered encounter medications on file as of 02/24/2018.     Review of Systems  Constitutional: Positive for activity change, appetite change and fatigue. Negative for chills and fever.  HENT: Positive for trouble swallowing. Negative for congestion and voice change.   Eyes: Negative for visual disturbance.  Respiratory: Negative for chest tightness and shortness of breath.   Cardiovascular: Negative for chest pain, palpitations and leg swelling.  Gastrointestinal: Negative for abdominal pain, constipation, diarrhea, nausea and vomiting.       Hemorrhoids, not a surgical candidate  Genitourinary: Negative for dysuria and urgency.  Musculoskeletal: Positive for gait problem.  Neurological: Positive for speech difficulty. Negative for dizziness.  Psychiatric/Behavioral: Positive for confusion. Negative for agitation, behavioral problems and sleep disturbance. The patient is not  nervous/anxious.     Vitals:   02/24/18 0950  BP: 129/77  Pulse: 62  Resp: 16  Temp: 98.2 F (36.8 C)  TempSrc: Oral  SpO2: 95%  Weight: 139 lb (63 kg)  Height: 5' (1.524 m)   Body mass index is 27.15 kg/m. Physical Exam Constitutional:      Appearance: Normal appearance.  HENT:     Head: Normocephalic and atraumatic.  Cardiovascular:     Rate and Rhythm: Rhythm irregular.  Pulmonary:     Effort: Pulmonary effort is normal.     Breath sounds: Rhonchi present. No rales.  Abdominal:     General: Bowel sounds are normal. There is no distension.     Palpations: Abdomen is soft. There is no mass.     Tenderness: There is no abdominal tenderness. There is no guarding.     Hernia: No hernia is present.  Musculoskeletal:  Comments: Right hemiparesis; uses walker  Skin:    General: Skin is warm and dry.     Capillary Refill: Capillary refill takes less than 2 seconds.  Neurological:     Mental Status: She is alert. Mental status is at baseline.     Gait: Gait abnormal.     Comments: Aphasic, does communicate but sometimes chooses the wrong word and becomes frustrated; seems to understand well  Psychiatric:        Mood and Affect: Mood normal.     Labs reviewed: Basic Metabolic Panel: Recent Labs    08/25/17 0802 08/27/17 0632  02/11/18 1548 02/18/18 02/20/18 0700  NA 137 138   < > 132* 132* 131*  K 4.1 4.4   < > 4.4 4.0 4.2  CL 102 107  --  102  --   --   CO2 26 23  --  20*  --   --   GLUCOSE 110* 97  --  174*  --   --   BUN 44* 51*   < > 19 19 21   CREATININE 1.34* 1.29*   < > 1.50* 1.6* 1.5*  CALCIUM 9.1 8.6*  --  8.0*  --   --    < > = values in this interval not displayed.   Liver Function Tests: Recent Labs    08/02/17 1907 08/07/17 0547  02/13/18 0700 02/20/18 0700 02/23/18 0300  AST 21 17   < > 13 35 40*  ALT 13* 12   < > 9 28 34  ALKPHOS 74 71   < > 79 233* 385*  BILITOT 0.6 0.7  --   --   --   --   PROT 6.4* 5.7*  --   --   --   --     ALBUMIN 3.7 2.8*  --   --   --   --    < > = values in this interval not displayed.   No results for input(s): LIPASE, AMYLASE in the last 8760 hours. No results for input(s): AMMONIA in the last 8760 hours. CBC: Recent Labs    08/02/17 1907  08/07/17 0547 08/15/17 0652  02/11/18 1548 02/18/18 02/20/18 0700  WBC 7.1  --  9.7 8.2   < > 11.4* 27.2 7.3  NEUTROABS 4.9  --  8.0*  --   --   --  26  --   HGB 11.9*   < > 11.2* 11.4*   < > 11.2* 10.5* 11.6*  HCT 36.6   < > 33.1* 35.3*   < > 35.1* 31* 34*  MCV 95.8  --  92.5 93.4  --  88.0  --   --   PLT 204  --  188 301   < > 244 279 255   < > = values in this interval not displayed.   Cardiac Enzymes: Recent Labs    08/26/17 1151  TROPONINI <0.03   BNP: Invalid input(s): POCBNP Lab Results  Component Value Date   HGBA1C 5.3 08/02/2013   Lab Results  Component Value Date   TSH 2.72 08/29/2017   Lab Results  Component Value Date   VITAMINB12 563 07/06/2009   Lab Results  Component Value Date   FOLATE  07/06/2009    >20.0 (NOTE)  Reference Ranges        Deficient:       0.4 - 3.3 ng/mL        Indeterminate:   3.4 - 5.4 ng/mL  Normal:              > 5.4 ng/mL   Lab Results  Component Value Date   IRON 42 09/22/2009   TIBC 281 09/22/2009   FERRITIN 49 06/26/2012    Imaging and Procedures obtained prior to SNF admission: Dg Chest 2 View  Result Date: 02/11/2018 CLINICAL DATA:  Mid chest pain. EXAM: CHEST - 2 VIEW COMPARISON:  January 31, 2017 FINDINGS: The heart size is enlarged. The mediastinal contour is stable. Mild bilateral diffuse increased pulmonary interstitium is identified. There is no focal pneumonia. There is no pleural effusion. There is a hiatal hernia. The visualized skeletal structures are stable. IMPRESSION: Mild congestive heart failure. Electronically Signed   By: Abelardo Diesel M.D.   On: 02/11/2018 16:23    Assessment/Plan 1. HCAP (healthcare-associated pneumonia) -apparently cause of her  leukocytosis, wbc trended down and clinically improved though she never had typical pneumonia symptoms--?aspirated  2. Dehydration -resolved with fluids and better intake   3. Diarrhea, unspecified type -resolved, had been profuse and dehydrated her, now gone  4. Breast mass, right -oddly, xray showed possible left breast mass, but one palpable, right is palpable and patient does not want any evaluation--says it's been checked before and she wants to be left alone and does not want to burden her children  5. Expressive aphasia -ongoing, cont AL enhanced support    Family/ staff Communication: discussed with rehab nurse and nurse manager  Labs/tests ordered:f/u with me in clinic for updated MOST  Taunya Goral L. Jeremaih Klima, D.O. [facility] [address] [place], [place] [address] Cell Phone (Mon-Fri 8am-5pm):  [phone] On Call:  [phone] & follow prompts after 5pm & weekends Office Phone:  [phone] Office Fax:  [phone]

## 2018-02-24 NOTE — Progress Notes (Signed)
Carelink Summary Report / Loop Recorder 

## 2018-02-26 LAB — CUP PACEART REMOTE DEVICE CHECK
Date Time Interrogation Session: 20200113183631
Implantable Pulse Generator Implant Date: 20190903

## 2018-02-27 DIAGNOSIS — F0151 Vascular dementia with behavioral disturbance: Secondary | ICD-10-CM | POA: Diagnosis not present

## 2018-02-27 DIAGNOSIS — I610 Nontraumatic intracerebral hemorrhage in hemisphere, subcortical: Secondary | ICD-10-CM | POA: Diagnosis not present

## 2018-02-27 DIAGNOSIS — R4701 Aphasia: Secondary | ICD-10-CM | POA: Diagnosis not present

## 2018-02-27 DIAGNOSIS — L89309 Pressure ulcer of unspecified buttock, unspecified stage: Secondary | ICD-10-CM | POA: Diagnosis not present

## 2018-02-27 DIAGNOSIS — I69320 Aphasia following cerebral infarction: Secondary | ICD-10-CM | POA: Diagnosis not present

## 2018-02-27 DIAGNOSIS — K645 Perianal venous thrombosis: Secondary | ICD-10-CM | POA: Diagnosis not present

## 2018-03-01 LAB — CUP PACEART REMOTE DEVICE CHECK
Date Time Interrogation Session: 20191211173826
Implantable Pulse Generator Implant Date: 20190903

## 2018-03-11 ENCOUNTER — Encounter: Payer: Self-pay | Admitting: Internal Medicine

## 2018-03-18 ENCOUNTER — Encounter: Payer: Self-pay | Admitting: Internal Medicine

## 2018-03-18 ENCOUNTER — Non-Acute Institutional Stay: Payer: Medicare Other | Admitting: Internal Medicine

## 2018-03-18 VITALS — BP 158/68 | HR 60 | Temp 98.6°F | Ht 60.0 in | Wt 141.0 lb

## 2018-03-18 DIAGNOSIS — R4701 Aphasia: Secondary | ICD-10-CM

## 2018-03-18 DIAGNOSIS — K52832 Lymphocytic colitis: Secondary | ICD-10-CM

## 2018-03-18 DIAGNOSIS — M79601 Pain in right arm: Secondary | ICD-10-CM | POA: Diagnosis not present

## 2018-03-18 DIAGNOSIS — I69151 Hemiplegia and hemiparesis following nontraumatic intracerebral hemorrhage affecting right dominant side: Secondary | ICD-10-CM

## 2018-03-18 DIAGNOSIS — Z7189 Other specified counseling: Secondary | ICD-10-CM

## 2018-03-18 DIAGNOSIS — I639 Cerebral infarction, unspecified: Secondary | ICD-10-CM | POA: Diagnosis not present

## 2018-03-18 DIAGNOSIS — I5032 Chronic diastolic (congestive) heart failure: Secondary | ICD-10-CM | POA: Diagnosis not present

## 2018-03-18 DIAGNOSIS — K645 Perianal venous thrombosis: Secondary | ICD-10-CM

## 2018-03-18 NOTE — Progress Notes (Signed)
Location:  Occupational psychologist of Service:  Clinic (12)  Provider: Kvion Shapley L. Mariea Clonts, D.O., C.M.D.  Code Status: DNR, MOST Goals of Care:  Advanced Directives 03/18/2018  Does Patient Have a Medical Advance Directive? Yes  Type of Paramedic [name];Living will  Does patient want to make changes to medical advance directive? No - Patient declined  Copy of [place] in Chart? Yes - validated most recent copy scanned in chart (See row information)  Would patient like information on creating a medical advance directive? -  Pre-existing out of facility DNR order (yellow form or pink MOST form) Yellow form placed in chart (order not valid for inpatient use);Pink MOST form placed in chart (order not valid for inpatient use)     Chief Complaint  Patient presents with  . Medical Management of Chronic Issues    57[facility] follow-up    HPI: Patient is a 85y.o. female seen today for medical management of chronic diseases.    She's staying in her room.  She's stopped going for lunch and dinner.  It's a chore.  She will somtimes ask for a wheelchari b/c she feels like her right arm is not strong enough for the walker.  She is eating in her room.  She eats about half her meal.  Gets cereal and a muffin and says she eats all of that.  She had half of her tKuwaits[place]wich at lunch.    Bowels:  bms are much smaller.  Able to sleep through the night now.  She continues to have colitis.  Her son thinks bowels are better in the past 10 days with the entocort taper started 03/10/2018.   Hemorrhoids have shrunken.  Rides most of time not walking.    MOST reviewed and updated with discussion with pt, two sons, JClair Gullingand [name]  New orders are DNR, comfort measures, abx and fluids if indicated, no tube feeding.  Past Medical History:  Diagnosis Date  . Abdominal mass   . AKI [place]) ([place]  . Atrial fibrillation ([facility]   .  Colitis, ulcerative ([facility]   . Cough    WHITE SPUTUM FOR 2 WEEKS, NO FEVER  . Degenerative joint disease   . Diverticulosis   . DVT (deep venous thrombosis) ([facility] [place]   BOTH LUNGS  . GERD (gastroesophageal reflux disease)   . Hyperlipidemia   . Hyperplastic colon polyp   . Hypertension   . Hypothyroidism   . Iron deficiency anemia    ON IRON AT TIMES  . PE (pulmonary embolism) 2009  . Persistent atrial fibrillation   . Positive H. pylori test 01/20/96  . S/P dilatation of esophageal stricture 1992  . Skin cancer    h/o basal and squamous skin cancer--> removed  . Stroke ([facility]     Past Surgical History:  Procedure Laterality Date  . APPENDECTOMY    . BREAST EXCISIONAL BIOPSY Left 1992   benign  . BREAST LUMPECTOMY     benign  . DILATION AND CURETTAGE OF UTERUS     for endometrial polyps  . ENDOSCOPIC RETROGRADE CHOLANGIOPANCREATOGRAPHY (ERCP) WITH PROPOFOL N/A 03/23/2015   Procedure: ENDOSCOPIC RETROGRADE CHOLANGIOPANCREATOGRAPHY (ERCP) WITH PROPOFOL;  Surgeon: DMilus Banister MD;  Location: [facility];  Service: Endoscopy;  Laterality: N/A;  . JOINT REPLACEMENT    . LOOP RECORDER INSERTION N/A 10/14/2017   Procedure: LOOP RECORDER INSERTION;  Surgeon: AThompson Grayer MD;  [place]: [facility];  Service: Cardiovascular;  Laterality: N/A;  . ORIF ANKLE FRACTURE Left    X 2  . REPLACEMENT TOTAL KNEE     left  . SURGERY DONE TO REMOVE LEFT ANKLE HARDWARE LATER    . TONSILLECTOMY      Allergies  Allergen Reactions  . Iohexol Hives  . Ivp Dye [Iodinated Diagnostic Agents] Hives    Was able to tolerate Contrast Media for CT scan.  [name] [Shellfish Allergy] Nausea And Vomiting    Projectile vomiting  . Shellfish-Derived Products Nausea And Vomiting  . Sulfa Antibiotics Hives  . Sulfamethoxazole Hives    Outpatient Encounter Medications as of 03/18/2018  Medication Sig  . acetaminophen (TYLENOL) 325 MG tablet Take 650 mg by mouth 2 (two) times daily.  Marland Kitchen  amiodarone (PACERONE) 100 MG tablet Take 1 tablet (100 mg total) by mouth daily.  Marland Kitchen aspirin 81 MG tablet Take 1 tablet (81 mg total) by mouth daily.  Marland Kitchen atorvastatin (LIPITOR) 10 MG tablet Take 10 mg by mouth daily.  . budesonide (ENTOCORT EC) 3 MG 24 hr capsule Take 1-3 capsules by mouth as directed.  . carvedilol (COREG) 6.25 MG tablet Take 3 tablets (18.75 mg total) by mouth 2 (two) times daily with a meal.  . Hypromellose (GONIOVISC) 2 % SOLN Place 1 drop into both eyes daily as needed.  Marland Kitchen levothyroxine (SYNTHROID, LEVOTHROID) 75 MCG tablet Take 1 tablet (75 mcg total) by mouth daily.  . Lidocaine, Anorectal, (RECTICARE) 5 % CREA Apply topically as needed.  . Melatonin 5 MG TABS Take 5 mg by mouth at bedtime.  . ondansetron (ZOFRAN) 4 MG tablet Take 4 mg by mouth every 6 (six) hours as needed for nausea or vomiting.  . pantoprazole (PROTONIX) 40 MG tablet Take 40 mg by mouth daily.  . potassium chloride 20 MEQ/15ML (10%) SOLN Take 7.5 mLs (10 mEq total) by mouth daily.  . Simethicone (GAS RELIEF PO) Take 1 tablet by mouth 2 (two) times daily.  Marland Kitchen tiZANidine (ZANAFLEX) 2 MG tablet Take 1 tablet (2 mg total) by mouth 4 (four) times daily -  with meals and at bedtime.  . WITCH HAZEL EX Apply topically as needed.  . [DISCONTINUED] docusate sodium (COLACE) 100 MG capsule Take 100 mg by mouth daily.  . [DISCONTINUED] levofloxacin (LEVAQUIN) 500 MG tablet Take 500 mg by mouth daily. 500 mg on day 1, then 250 mg on day 2-7.  Renally dosed  . [DISCONTINUED] phenylephrine-shark liver oil-mineral oil-petrolatum (PREPARATION H) 0.25-3-14-71.9 % rectal ointment Place 1 application rectally at bedtime.  . [DISCONTINUED] saccharomyces boulardii (FLORASTOR) 250 MG capsule Take 250 mg by mouth 2 (two) times daily.   No facility-administered encounter medications on file as of 03/18/2018.     Review of Systems:  Review of Systems  Constitutional: Negative for chills, fever and weight loss.  HENT: Negative  for congestion and hearing loss.   Eyes: Negative for blurred vision.  Respiratory: Negative for cough and shortness of breath.   Cardiovascular: Positive for leg swelling. Negative for chest pain and palpitations.  Gastrointestinal: Positive for diarrhea. Negative for abdominal pain, blood in stool, constipation, heartburn and melena.  Genitourinary: Negative for dysuria.  Musculoskeletal: Negative for back pain, falls, joint pain and myalgias.       Right arm painful and weak when walks with walker  Skin: Negative for itching and rash.  Neurological: Positive for speech change. Negative for dizziness, loss of consciousness and headaches.  Still struggles with words and sometimes comprehension  Endo/Heme/Allergies: Bruises/bleeds easily.  Psychiatric/Behavioral: Positive for memory loss. The patient is not nervous/anxious and does not have insomnia.        There was some question about depression, but lately she's been better    Health Maintenance  Topic Date Due  . TETANUS/TDAP  04/14/2018  . INFLUENZA VACCINE  Completed  . DEXA SCAN  Completed  . PNA vac Low Risk Adult  Completed    Physical Exam: Vitals:   03/18/18 1535  BP: (!) 158/68  Pulse: 60  Temp: 98.6 F (37 C)  TempSrc: Oral  SpO2: 97%  Weight: 141 lb (64 kg)  Height: 5' (1.524 m)   Body mass index is 27.54 kg/m. Physical Exam Vitals signs and nursing note reviewed.  Constitutional:      General: She is not in acute distress.    Appearance: Normal appearance. She is not ill-appearing or toxic-appearing.  HENT:     Head: Normocephalic and atraumatic.  Neck:     Musculoskeletal: Neck supple.  Cardiovascular:     Comments: irreg irreg Pulmonary:     Effort: Pulmonary effort is normal.     Breath sounds: Normal breath sounds. No rales.  Abdominal:     General: Bowel sounds are normal.     Palpations: Abdomen is soft.  Musculoskeletal:     Right lower leg: Edema present.     Left lower leg: Edema  present.     Comments: 4+/5 RUE vs 5/5 LUE and ambulates with rollator; 1+ pitting b/l LEs with shiny skin  Skin:    General: Skin is warm and dry.     Coloration: Skin is pale.  Neurological:     Mental Status: She is alert. Mental status is at baseline.     Comments: Expressive aphasia persists  Psychiatric:        Mood and Affect: Mood normal.     Labs reviewed: Basic Metabolic Panel: Recent Labs    08/25/17 0802 08/27/17 0632 08/29/17 0640  02/11/18 1548 02/18/18 02/20/18 0700  NA 137 138 137   < > 132* 132* 131*  K 4.1 4.4 3.8   < > 4.4 4.0 4.2  CL 102 107  --   --  102  --   --   CO2 26 23  --   --  20*  --   --   GLUCOSE 110* 97  --   --  174*  --   --   BUN 44* 51* 39*   < > 19 19 21   CREATININE 1.34* 1.29* 1.2*   < > 1.50* 1.6* 1.5*  CALCIUM 9.1 8.6*  --   --  8.0*  --   --   TSH  --   --  2.72  --   --   --   --    < > = values in this interval not displayed.   Liver Function Tests: Recent Labs    08/02/17 1907 08/07/17 0547  02/13/18 02/20/18 0700 02/23/18 0300  AST 21 17   < > 13 35 40*  ALT 13* 12   < > 9 28 34  ALKPHOS 74 71   < > 79 233* 385*  BILITOT 0.6 0.7  --   --   --   --   PROT 6.4* 5.7*  --   --   --   --   ALBUMIN 3.7 2.8*  --   --   --   --    < > =  values in this interval not displayed.   No results for input(s): LIPASE, AMYLASE in the last 8760 hours. No results for input(s): AMMONIA in the last 8760 hours. CBC: Recent Labs    08/02/17 1907  08/07/17 0547 08/15/17 0652  02/11/18 1548 02/18/18 02/20/18 0700  WBC 7.1  --  9.7 8.2   < > 11.4* 27.2 7.3  NEUTROABS 4.9  --  8.0*  --   --   --  26  --   HGB 11.9*   < > 11.2* 11.4*   < > 11.2* 10.5* 11.6*  HCT 36.6   < > 33.1* 35.3*   < > 35.1* 31* 34*  MCV 95.8  --  92.5 93.4  --  88.0  --   --   PLT 204  --  188 301   < > 244 279 255   < > = values in this interval not displayed.   Lipid Panel: Recent Labs    08/05/17 0726  TRIG 76   Lab Results  Component Value Date    HGBA1C 5.3 08/02/2013    Procedures since last visit: No results found.  Assessment/Plan 1. Right arm pain -requests ride to meals now some of the time due to pain limiting her ability to walk there, but also does not want to eat all meals in her room--order written in chart  2. Lymphocytic colitis -complete 6 wk entocort taper as planned--if not becoming effective in another week, may need GI input   3. Expressive aphasia -ongoing, frustrates her, but she denies being depressed--is laughing and sociable  4. Hemiparesis of right dominant side as late effect of nontraumatic intracerebral hemorrhage (HCC) -ongoing mild weakness of right with some pain in UE limiting extent of ambulation with walker  5. Chronic diastolic congestive heart failure (HCC) -cont current regimen, does not appear to have significant volume overload, but there is edema of ankles--has had compression hose in the past but they got stopped somehow--she agrees to them now so will reorder  6. External thrombosed hemorrhoids -discussed with her two sons what GI thought--did not recommend intervention with any surgery given her other conditions -she has prn orders for rectal cream for comfort, but it's not consistently done and they would like for her to definitely get it bid -will change sitz baths to bid before the rectal cream  7.  ACP:   Last ACP Note 12/19/2017 to 03/19/2018       ACP (Advance Care Planning) by [name], DO at 03/18/2018  3:30 PM    Date of Service   Author Author Type Status Note Type File Time  03/18/2018 Addend [name], DO Physician Signed ACP (Advance Care Planning) 03/19/2018             Met with patient and her two sons, [name] and [name], at her clinic appt.  We reviewed the latest medical events including her rehab stay when she been treated with laxatives by GI for constipation and abx for pneumonia that led to a return of her colitis.  We also reviewed the left sided density noted  on the xray.  I had attempted to confirm this on her exam and found a hard area of her right breast instead.  She says it's been there and she doesn't want anything done about it.  She feels like she's a burden on her sons.    We reviewed the MOST form and the conversations that her sons have had with  NP Wert and staff about [name]'s goals of care.  These are consistent with their desires today.  Old MOST form was voided as patient indicates she does NOT want to be hospitalized any longer and would like her care to be provided in our rehab center here at [facility].  She is interested in receiving antibiotics and fluids if necessary.  She still is clear she does NOT want tube feeding.  Form was completed and copy made for scanning.               25 mins spent on acp today  Labs/tests ordered:  No new Next appt:  07/29/2018  Denna Fryberger L. Kamry Faraci, D.O. [facility] [address], [place] [address] Cell Phone (Mon-Fri 8am-5pm):  [phone] On Call:  [phone] & follow prompts after 5pm & weekends Office Phone:  [phone] Office Fax:  [phone]

## 2018-03-19 NOTE — ACP (Advance Care Planning) (Signed)
Met with patient and her two sons, Sara Russell and Sara Russell, at her clinic appt.  We reviewed the latest medical events including her rehab stay when she been treated with laxatives by GI for constipation and abx for pneumonia that led to a return of her colitis.  We also reviewed the left sided density noted on the xray.  I had attempted to confirm this on her exam and found a hard area of her right breast instead.  She says it's been there and she doesn't want anything done about it.  She feels like she's a burden on her sons.    We reviewed the MOST form and the conversations that her sons have had with NP Melvyn Novas and staff about [name]'s goals of care.  These are consistent with their desires today.  Old MOST form was voided as patient indicates she does NOT want to be hospitalized any longer and would like her care to be provided in our rehab center here at [facility].  She is interested in receiving antibiotics and fluids if necessary.  She still is clear she does NOT want tube feeding.  Form was completed and copy made for scanning.

## 2018-03-30 ENCOUNTER — Ambulatory Visit (INDEPENDENT_AMBULATORY_CARE_PROVIDER_SITE_OTHER): Payer: Medicare Other

## 2018-03-30 DIAGNOSIS — I639 Cerebral infarction, unspecified: Secondary | ICD-10-CM

## 2018-03-31 LAB — CUP PACEART REMOTE DEVICE CHECK
Date Time Interrogation Session: 20200215184131
Implantable Pulse Generator Implant Date: 20190903

## 2018-04-06 DIAGNOSIS — R41844 Frontal lobe and executive function deficit: Secondary | ICD-10-CM | POA: Diagnosis not present

## 2018-04-06 DIAGNOSIS — M79601 Pain in right arm: Secondary | ICD-10-CM | POA: Diagnosis not present

## 2018-04-06 DIAGNOSIS — W010XXS Fall on same level from slipping, tripping and stumbling without subsequent striking against object, sequela: Secondary | ICD-10-CM | POA: Diagnosis not present

## 2018-04-06 DIAGNOSIS — I69151 Hemiplegia and hemiparesis following nontraumatic intracerebral hemorrhage affecting right dominant side: Secondary | ICD-10-CM | POA: Diagnosis not present

## 2018-04-06 DIAGNOSIS — I69315 Cognitive social or emotional deficit following cerebral infarction: Secondary | ICD-10-CM | POA: Diagnosis not present

## 2018-04-06 DIAGNOSIS — M25511 Pain in right shoulder: Secondary | ICD-10-CM | POA: Diagnosis not present

## 2018-04-06 DIAGNOSIS — R296 Repeated falls: Secondary | ICD-10-CM | POA: Diagnosis not present

## 2018-04-07 DIAGNOSIS — R296 Repeated falls: Secondary | ICD-10-CM | POA: Diagnosis not present

## 2018-04-07 DIAGNOSIS — M25511 Pain in right shoulder: Secondary | ICD-10-CM | POA: Diagnosis not present

## 2018-04-07 DIAGNOSIS — I69315 Cognitive social or emotional deficit following cerebral infarction: Secondary | ICD-10-CM | POA: Diagnosis not present

## 2018-04-07 DIAGNOSIS — R41844 Frontal lobe and executive function deficit: Secondary | ICD-10-CM | POA: Diagnosis not present

## 2018-04-07 DIAGNOSIS — M79601 Pain in right arm: Secondary | ICD-10-CM | POA: Diagnosis not present

## 2018-04-07 DIAGNOSIS — I69151 Hemiplegia and hemiparesis following nontraumatic intracerebral hemorrhage affecting right dominant side: Secondary | ICD-10-CM | POA: Diagnosis not present

## 2018-04-08 DIAGNOSIS — M25511 Pain in right shoulder: Secondary | ICD-10-CM | POA: Diagnosis not present

## 2018-04-08 DIAGNOSIS — M79601 Pain in right arm: Secondary | ICD-10-CM | POA: Diagnosis not present

## 2018-04-08 DIAGNOSIS — I69315 Cognitive social or emotional deficit following cerebral infarction: Secondary | ICD-10-CM | POA: Diagnosis not present

## 2018-04-08 DIAGNOSIS — R41844 Frontal lobe and executive function deficit: Secondary | ICD-10-CM | POA: Diagnosis not present

## 2018-04-08 DIAGNOSIS — I69151 Hemiplegia and hemiparesis following nontraumatic intracerebral hemorrhage affecting right dominant side: Secondary | ICD-10-CM | POA: Diagnosis not present

## 2018-04-08 DIAGNOSIS — R296 Repeated falls: Secondary | ICD-10-CM | POA: Diagnosis not present

## 2018-04-08 NOTE — Progress Notes (Signed)
Carelink Summary Report / Loop Recorder 

## 2018-04-09 DIAGNOSIS — I69151 Hemiplegia and hemiparesis following nontraumatic intracerebral hemorrhage affecting right dominant side: Secondary | ICD-10-CM | POA: Diagnosis not present

## 2018-04-09 DIAGNOSIS — I69315 Cognitive social or emotional deficit following cerebral infarction: Secondary | ICD-10-CM | POA: Diagnosis not present

## 2018-04-09 DIAGNOSIS — M79601 Pain in right arm: Secondary | ICD-10-CM | POA: Diagnosis not present

## 2018-04-09 DIAGNOSIS — M25511 Pain in right shoulder: Secondary | ICD-10-CM | POA: Diagnosis not present

## 2018-04-09 DIAGNOSIS — R41844 Frontal lobe and executive function deficit: Secondary | ICD-10-CM | POA: Diagnosis not present

## 2018-04-09 DIAGNOSIS — R296 Repeated falls: Secondary | ICD-10-CM | POA: Diagnosis not present

## 2018-04-13 DIAGNOSIS — W010XXS Fall on same level from slipping, tripping and stumbling without subsequent striking against object, sequela: Secondary | ICD-10-CM | POA: Diagnosis not present

## 2018-04-13 DIAGNOSIS — R41844 Frontal lobe and executive function deficit: Secondary | ICD-10-CM | POA: Diagnosis not present

## 2018-04-13 DIAGNOSIS — R296 Repeated falls: Secondary | ICD-10-CM | POA: Diagnosis not present

## 2018-04-13 DIAGNOSIS — M25511 Pain in right shoulder: Secondary | ICD-10-CM | POA: Diagnosis not present

## 2018-04-13 DIAGNOSIS — I69151 Hemiplegia and hemiparesis following nontraumatic intracerebral hemorrhage affecting right dominant side: Secondary | ICD-10-CM | POA: Diagnosis not present

## 2018-04-13 DIAGNOSIS — M79601 Pain in right arm: Secondary | ICD-10-CM | POA: Diagnosis not present

## 2018-04-13 DIAGNOSIS — I69315 Cognitive social or emotional deficit following cerebral infarction: Secondary | ICD-10-CM | POA: Diagnosis not present

## 2018-04-14 DIAGNOSIS — R296 Repeated falls: Secondary | ICD-10-CM | POA: Diagnosis not present

## 2018-04-14 DIAGNOSIS — M25511 Pain in right shoulder: Secondary | ICD-10-CM | POA: Diagnosis not present

## 2018-04-14 DIAGNOSIS — I69151 Hemiplegia and hemiparesis following nontraumatic intracerebral hemorrhage affecting right dominant side: Secondary | ICD-10-CM | POA: Diagnosis not present

## 2018-04-14 DIAGNOSIS — M79601 Pain in right arm: Secondary | ICD-10-CM | POA: Diagnosis not present

## 2018-04-14 DIAGNOSIS — R41844 Frontal lobe and executive function deficit: Secondary | ICD-10-CM | POA: Diagnosis not present

## 2018-04-14 DIAGNOSIS — I69315 Cognitive social or emotional deficit following cerebral infarction: Secondary | ICD-10-CM | POA: Diagnosis not present

## 2018-04-15 DIAGNOSIS — I69151 Hemiplegia and hemiparesis following nontraumatic intracerebral hemorrhage affecting right dominant side: Secondary | ICD-10-CM | POA: Diagnosis not present

## 2018-04-15 DIAGNOSIS — R41844 Frontal lobe and executive function deficit: Secondary | ICD-10-CM | POA: Diagnosis not present

## 2018-04-15 DIAGNOSIS — I69315 Cognitive social or emotional deficit following cerebral infarction: Secondary | ICD-10-CM | POA: Diagnosis not present

## 2018-04-15 DIAGNOSIS — M25511 Pain in right shoulder: Secondary | ICD-10-CM | POA: Diagnosis not present

## 2018-04-15 DIAGNOSIS — R296 Repeated falls: Secondary | ICD-10-CM | POA: Diagnosis not present

## 2018-04-15 DIAGNOSIS — M79601 Pain in right arm: Secondary | ICD-10-CM | POA: Diagnosis not present

## 2018-04-17 DIAGNOSIS — M79601 Pain in right arm: Secondary | ICD-10-CM | POA: Diagnosis not present

## 2018-04-17 DIAGNOSIS — R41844 Frontal lobe and executive function deficit: Secondary | ICD-10-CM | POA: Diagnosis not present

## 2018-04-17 DIAGNOSIS — M25511 Pain in right shoulder: Secondary | ICD-10-CM | POA: Diagnosis not present

## 2018-04-17 DIAGNOSIS — I69151 Hemiplegia and hemiparesis following nontraumatic intracerebral hemorrhage affecting right dominant side: Secondary | ICD-10-CM | POA: Diagnosis not present

## 2018-04-17 DIAGNOSIS — R296 Repeated falls: Secondary | ICD-10-CM | POA: Diagnosis not present

## 2018-04-17 DIAGNOSIS — I69315 Cognitive social or emotional deficit following cerebral infarction: Secondary | ICD-10-CM | POA: Diagnosis not present

## 2018-04-19 DIAGNOSIS — M79601 Pain in right arm: Secondary | ICD-10-CM | POA: Diagnosis not present

## 2018-04-19 DIAGNOSIS — I69315 Cognitive social or emotional deficit following cerebral infarction: Secondary | ICD-10-CM | POA: Diagnosis not present

## 2018-04-19 DIAGNOSIS — R296 Repeated falls: Secondary | ICD-10-CM | POA: Diagnosis not present

## 2018-04-19 DIAGNOSIS — M25511 Pain in right shoulder: Secondary | ICD-10-CM | POA: Diagnosis not present

## 2018-04-19 DIAGNOSIS — R41844 Frontal lobe and executive function deficit: Secondary | ICD-10-CM | POA: Diagnosis not present

## 2018-04-19 DIAGNOSIS — I69151 Hemiplegia and hemiparesis following nontraumatic intracerebral hemorrhage affecting right dominant side: Secondary | ICD-10-CM | POA: Diagnosis not present

## 2018-04-21 DIAGNOSIS — I69315 Cognitive social or emotional deficit following cerebral infarction: Secondary | ICD-10-CM | POA: Diagnosis not present

## 2018-04-21 DIAGNOSIS — R296 Repeated falls: Secondary | ICD-10-CM | POA: Diagnosis not present

## 2018-04-21 DIAGNOSIS — M25511 Pain in right shoulder: Secondary | ICD-10-CM | POA: Diagnosis not present

## 2018-04-21 DIAGNOSIS — I69151 Hemiplegia and hemiparesis following nontraumatic intracerebral hemorrhage affecting right dominant side: Secondary | ICD-10-CM | POA: Diagnosis not present

## 2018-04-21 DIAGNOSIS — R41844 Frontal lobe and executive function deficit: Secondary | ICD-10-CM | POA: Diagnosis not present

## 2018-04-21 DIAGNOSIS — M79601 Pain in right arm: Secondary | ICD-10-CM | POA: Diagnosis not present

## 2018-04-23 DIAGNOSIS — R296 Repeated falls: Secondary | ICD-10-CM | POA: Diagnosis not present

## 2018-04-23 DIAGNOSIS — I69151 Hemiplegia and hemiparesis following nontraumatic intracerebral hemorrhage affecting right dominant side: Secondary | ICD-10-CM | POA: Diagnosis not present

## 2018-04-23 DIAGNOSIS — I69315 Cognitive social or emotional deficit following cerebral infarction: Secondary | ICD-10-CM | POA: Diagnosis not present

## 2018-04-23 DIAGNOSIS — M79601 Pain in right arm: Secondary | ICD-10-CM | POA: Diagnosis not present

## 2018-04-23 DIAGNOSIS — R41844 Frontal lobe and executive function deficit: Secondary | ICD-10-CM | POA: Diagnosis not present

## 2018-04-23 DIAGNOSIS — M25511 Pain in right shoulder: Secondary | ICD-10-CM | POA: Diagnosis not present

## 2018-04-29 DIAGNOSIS — I69151 Hemiplegia and hemiparesis following nontraumatic intracerebral hemorrhage affecting right dominant side: Secondary | ICD-10-CM | POA: Diagnosis not present

## 2018-04-29 DIAGNOSIS — M25511 Pain in right shoulder: Secondary | ICD-10-CM | POA: Diagnosis not present

## 2018-04-29 DIAGNOSIS — I69315 Cognitive social or emotional deficit following cerebral infarction: Secondary | ICD-10-CM | POA: Diagnosis not present

## 2018-04-29 DIAGNOSIS — R296 Repeated falls: Secondary | ICD-10-CM | POA: Diagnosis not present

## 2018-04-29 DIAGNOSIS — R41844 Frontal lobe and executive function deficit: Secondary | ICD-10-CM | POA: Diagnosis not present

## 2018-04-29 DIAGNOSIS — M79601 Pain in right arm: Secondary | ICD-10-CM | POA: Diagnosis not present

## 2018-04-30 ENCOUNTER — Ambulatory Visit (INDEPENDENT_AMBULATORY_CARE_PROVIDER_SITE_OTHER): Payer: Medicare Other | Admitting: *Deleted

## 2018-04-30 ENCOUNTER — Other Ambulatory Visit: Payer: Self-pay

## 2018-04-30 DIAGNOSIS — I639 Cerebral infarction, unspecified: Secondary | ICD-10-CM | POA: Diagnosis not present

## 2018-05-01 DIAGNOSIS — I69151 Hemiplegia and hemiparesis following nontraumatic intracerebral hemorrhage affecting right dominant side: Secondary | ICD-10-CM | POA: Diagnosis not present

## 2018-05-01 DIAGNOSIS — I69315 Cognitive social or emotional deficit following cerebral infarction: Secondary | ICD-10-CM | POA: Diagnosis not present

## 2018-05-01 DIAGNOSIS — R41844 Frontal lobe and executive function deficit: Secondary | ICD-10-CM | POA: Diagnosis not present

## 2018-05-01 DIAGNOSIS — R296 Repeated falls: Secondary | ICD-10-CM | POA: Diagnosis not present

## 2018-05-01 DIAGNOSIS — M25511 Pain in right shoulder: Secondary | ICD-10-CM | POA: Diagnosis not present

## 2018-05-01 DIAGNOSIS — M79601 Pain in right arm: Secondary | ICD-10-CM | POA: Diagnosis not present

## 2018-05-01 LAB — CUP PACEART REMOTE DEVICE CHECK
Date Time Interrogation Session: 20200319194118
Implantable Pulse Generator Implant Date: 20190903

## 2018-05-04 DIAGNOSIS — R296 Repeated falls: Secondary | ICD-10-CM | POA: Diagnosis not present

## 2018-05-04 DIAGNOSIS — M25511 Pain in right shoulder: Secondary | ICD-10-CM | POA: Diagnosis not present

## 2018-05-04 DIAGNOSIS — R41844 Frontal lobe and executive function deficit: Secondary | ICD-10-CM | POA: Diagnosis not present

## 2018-05-04 DIAGNOSIS — I69315 Cognitive social or emotional deficit following cerebral infarction: Secondary | ICD-10-CM | POA: Diagnosis not present

## 2018-05-04 DIAGNOSIS — I69151 Hemiplegia and hemiparesis following nontraumatic intracerebral hemorrhage affecting right dominant side: Secondary | ICD-10-CM | POA: Diagnosis not present

## 2018-05-04 DIAGNOSIS — M79601 Pain in right arm: Secondary | ICD-10-CM | POA: Diagnosis not present

## 2018-05-06 ENCOUNTER — Ambulatory Visit: Payer: Medicare Other | Admitting: Internal Medicine

## 2018-05-06 DIAGNOSIS — M79601 Pain in right arm: Secondary | ICD-10-CM | POA: Diagnosis not present

## 2018-05-06 DIAGNOSIS — R41844 Frontal lobe and executive function deficit: Secondary | ICD-10-CM | POA: Diagnosis not present

## 2018-05-06 DIAGNOSIS — R296 Repeated falls: Secondary | ICD-10-CM | POA: Diagnosis not present

## 2018-05-06 DIAGNOSIS — I69315 Cognitive social or emotional deficit following cerebral infarction: Secondary | ICD-10-CM | POA: Diagnosis not present

## 2018-05-06 DIAGNOSIS — M25511 Pain in right shoulder: Secondary | ICD-10-CM | POA: Diagnosis not present

## 2018-05-06 DIAGNOSIS — I69151 Hemiplegia and hemiparesis following nontraumatic intracerebral hemorrhage affecting right dominant side: Secondary | ICD-10-CM | POA: Diagnosis not present

## 2018-05-08 ENCOUNTER — Other Ambulatory Visit: Payer: Self-pay

## 2018-05-08 ENCOUNTER — Encounter: Payer: Self-pay | Admitting: Adult Health

## 2018-05-08 ENCOUNTER — Non-Acute Institutional Stay: Payer: Medicare Other | Admitting: Adult Health

## 2018-05-08 DIAGNOSIS — R062 Wheezing: Secondary | ICD-10-CM

## 2018-05-08 DIAGNOSIS — B029 Zoster without complications: Secondary | ICD-10-CM | POA: Diagnosis not present

## 2018-05-08 DIAGNOSIS — M79601 Pain in right arm: Secondary | ICD-10-CM | POA: Diagnosis not present

## 2018-05-08 DIAGNOSIS — R41844 Frontal lobe and executive function deficit: Secondary | ICD-10-CM | POA: Diagnosis not present

## 2018-05-08 DIAGNOSIS — I69315 Cognitive social or emotional deficit following cerebral infarction: Secondary | ICD-10-CM | POA: Diagnosis not present

## 2018-05-08 DIAGNOSIS — M25511 Pain in right shoulder: Secondary | ICD-10-CM | POA: Diagnosis not present

## 2018-05-08 DIAGNOSIS — R296 Repeated falls: Secondary | ICD-10-CM | POA: Diagnosis not present

## 2018-05-08 DIAGNOSIS — I69151 Hemiplegia and hemiparesis following nontraumatic intracerebral hemorrhage affecting right dominant side: Secondary | ICD-10-CM | POA: Diagnosis not present

## 2018-05-08 NOTE — Progress Notes (Signed)
Location:  Occupational psychologist of Service:  ALF (13) Provider:   Cindi Carbon, ANP [place] [phone]   [name], DO  Patient Care Team: [name], DO as PCP - General (Geriatric Medicine) Benard Rink (Dentistry) [place], Heather, Randall (Optometry) Sydnee Levans, MD (Dermatology) Thompson Grayer, MD as Consulting Physician (Cardiology) [name], DO as Consulting Physician (Geriatric Medicine)  Extended Emergency Contact Information Primary Emergency Contact: [name] [place] of [place] Phone: [phone] Mobile Phone: [phone] Relation: Son Secondary Emergency Contact: [name] [place] of [place] Mobile Phone: [phone] Relation: Son  Code Status:  DNR Goals of care: Advanced Directive information Advanced Directives 03/18/2018  Does Patient Have a Medical Advance Directive? Yes  Type of Paramedic of [place];Living will  Does patient want to make changes to medical advance directive? No - Patient declined  Copy of [place] in Chart? Yes - validated most recent copy scanned in chart (See row information)  Would patient like information on creating a medical advance directive? -  Pre-existing out of facility DNR order (yellow form or pink MOST form) Yellow form placed in chart (order not valid for inpatient use);Pink MOST form placed in chart (order not valid for inpatient use)     Chief Complaint  Patient presents with  . Acute Visit    wheezing, rash    HPI:  Pt is a 85 y.o. female seen today for an acute visit for wheezing and rash. The staff reported a erythematous rash with vesicles noted to the right under arm area. The rash is tender to touch and slightly painful.  She is also reporting some dizziness not associated with standing or change of position. It comes and goes. BP has been stable. No focal deficit. She is also having  some wheezing noted at rest or exertion, no cough, no sob. Sats 99%.  No sick contacts. No recent travel.   Past Medical History:  Diagnosis Date  . Abdominal mass   . AKI (acute kidney injury) ([place]) 09/15/2014  . Atrial fibrillation ([place])   . Colitis, ulcerative ([place])   . Cough    WHITE SPUTUM FOR 2 WEEKS, NO FEVER  . Degenerative joint disease   . Diverticulosis   . DVT (deep venous thrombosis) ([place]) 2009   BOTH LUNGS  . GERD (gastroesophageal reflux disease)   . Hyperlipidemia   . Hyperplastic colon polyp   . Hypertension   . Hypothyroidism   . Iron deficiency anemia    ON IRON AT TIMES  . PE (pulmonary embolism) 2009  . Persistent atrial fibrillation   . Positive H. pylori test 01/20/96  . S/P dilatation of esophageal stricture 1992  . Skin cancer    h/o basal and squamous skin cancer--> removed  . Stroke [facility])    Past Surgical History:  Procedure Laterality Date  . APPENDECTOMY    . BREAST EXCISIONAL BIOPSY Left 1992   benign  . BREAST LUMPECTOMY     benign  . DILATION AND CURETTAGE OF UTERUS     for endometrial polyps  . ENDOSCOPIC RETROGRADE CHOLANGIOPANCREATOGRAPHY (ERCP) WITH PROPOFOL N/A 03/23/2015   Procedure: ENDOSCOPIC RETROGRADE CHOLANGIOPANCREATOGRAPHY (ERCP) WITH PROPOFOL;  Surgeon: Milus Banister, MD;  [facility]: [facility];  Service: Endoscopy;  Laterality: N/A;  . JOINT REPLACEMENT    . LOOP RECORDER INSERTION N/A 10/14/2017   Procedure: LOOP RECORDER INSERTION;  Surgeon: Thompson Grayer, MD;  Location: [facility]  [facility];  Service: Cardiovascular;  Laterality: N/A;  . ORIF ANKLE FRACTURE Left    X 2  . REPLACEMENT TOTAL KNEE     left  . SURGERY DONE TO REMOVE LEFT ANKLE HARDWARE LATER    . TONSILLECTOMY      Allergies  Allergen Reactions  . Iohexol Hives  . Ivp Dye [Iodinated Diagnostic Agents] Hives    Was able to tolerate Contrast Media for CT scan.  [name] [Shellfish Allergy] Nausea And Vomiting    Projectile vomiting  .  Shellfish-Derived Products Nausea And Vomiting  . Sulfa Antibiotics Hives  . Sulfamethoxazole Hives    Outpatient Encounter Medications as of 05/08/2018  Medication Sig  . amiodarone (PACERONE) 100 MG tablet Take 1 tablet (100 mg total) by mouth daily.  Marland Kitchen atorvastatin (LIPITOR) 10 MG tablet Take 10 mg by mouth daily.  . carvedilol (COREG) 6.25 MG tablet Take 3 tablets (18.75 mg total) by mouth 2 (two) times daily with a meal.  . Hypromellose (GONIOVISC) 2 % SOLN Place 1 drop into both eyes daily as needed.  Marland Kitchen levothyroxine (SYNTHROID, LEVOTHROID) 75 MCG tablet Take 1 tablet (75 mcg total) by mouth daily.  . Lidocaine, Anorectal, (RECTICARE) 5 % CREA Apply 1 application topically 2 (two) times daily.   . ondansetron (ZOFRAN) 4 MG tablet Take 4 mg by mouth every 6 (six) hours as needed for nausea or vomiting.  . pantoprazole (PROTONIX) 40 MG tablet Take 40 mg by mouth daily.  . potassium chloride 20 MEQ/15ML (10%) SOLN Take 7.5 mLs (10 mEq total) by mouth daily.  . Simethicone (GAS RELIEF PO) Take 1 tablet by mouth 2 (two) times daily.  Marland Kitchen tiZANidine (ZANAFLEX) 2 MG tablet Take 1 tablet (2 mg total) by mouth 4 (four) times daily -  with meals and at bedtime.  . WITCH HAZEL EX Apply topically as needed.  Marland Kitchen acetaminophen (TYLENOL) 325 MG tablet Take 650 mg by mouth 2 (two) times daily.  Marland Kitchen aspirin 81 MG tablet Take 1 tablet (81 mg total) by mouth daily.  . Melatonin 5 MG TABS Take 5 mg by mouth at bedtime.  . [DISCONTINUED] budesonide (ENTOCORT EC) 3 MG 24 hr capsule Take 1-3 capsules by mouth as directed.   No facility-administered encounter medications on file as of 05/08/2018.     Review of Systems  Constitutional: Negative for activity change, appetite change, chills, diaphoresis, fatigue, fever and unexpected weight change.  HENT: Negative for congestion.   Eyes: Negative for photophobia, pain, redness and visual disturbance.  Respiratory: Positive for wheezing. Negative for cough and  shortness of breath.   Cardiovascular: Negative for chest pain, palpitations and leg swelling.  Gastrointestinal: Negative for abdominal distention, abdominal pain, constipation and diarrhea.  Genitourinary: Negative for difficulty urinating and dysuria.  Musculoskeletal: Positive for arthralgias and gait problem (uses walker). Negative for back pain, joint swelling and myalgias.  Skin: Positive for rash.  Neurological: Positive for dizziness. Negative for tremors, seizures, syncope, facial asymmetry, speech difficulty, weakness, light-headedness, numbness and headaches.  Psychiatric/Behavioral: Negative for agitation, behavioral problems and confusion.       Memory loss    Immunization History  Administered Date(s) Administered  . H1N1 03/04/2008  . Influenza Split 11/12/2010, 11/26/2011  . Influenza Whole 11/26/2006, 11/03/2007, 12/10/2007, 11/16/2008, 11/20/2009  . Influenza,inj,Quad PF,6+ Mos 11/24/2012, 11/29/2013, 10/14/2014, 11/28/2015, 12/04/2016, 12/11/2017  . PPD Test 10/03/2014  . Pneumococcal Conjugate-13 09/10/2013  . Pneumococcal Polysaccharide-23 02/12/2000  . Td 02/11/1998, 04/13/2008  . Zoster 05/08/2006  Pertinent  Health Maintenance Due  Topic Date Due  . INFLUENZA VACCINE  Completed  . DEXA SCAN  Completed  . PNA vac Low Risk Adult  Completed   Fall Risk  03/18/2018 11/05/2017 09/24/2017 02/12/2017 01/27/2017  Falls in the past year? 0 No No No No  Number falls in past yr: 0 - - - -  Comment - - - - -  Injury with Fall? 0 - - - -   Functional Status Survey:    Vitals:   05/08/18 1423  BP: 128/88  Pulse: 65  Resp: (!) 24  Temp: 98.2 F (36.8 C)  SpO2: 99%  Weight: 132 lb (59.9 kg)   Body mass index is 25.78 kg/m. Physical Exam Vitals signs and nursing note reviewed.  Constitutional:      General: She is not in acute distress.    Appearance: She is not diaphoretic.  HENT:     Head: Normocephalic and atraumatic.     Nose: Rhinorrhea present. No  congestion.     Mouth/Throat:     Mouth: Mucous membranes are moist.     Pharynx: Oropharynx is clear. No oropharyngeal exudate or posterior oropharyngeal erythema.  Eyes:     General:        Right eye: No discharge.        Left eye: No discharge.     Conjunctiva/sclera: Conjunctivae normal.     Pupils: Pupils are equal, round, and reactive to light.  Neck:     Vascular: No JVD.  Cardiovascular:     Rate and Rhythm: Normal rate and regular rhythm.     Heart sounds: No murmur.  Pulmonary:     Effort: Pulmonary effort is normal. No respiratory distress.     Breath sounds: Wheezing present.  Abdominal:     General: Abdomen is flat. Bowel sounds are normal.     Palpations: Abdomen is soft.  Genitourinary:    Rectum: External hemorrhoid and internal hemorrhoid present.  Musculoskeletal:     Right lower leg: No edema.     Left lower leg: No edema.  Skin:    General: Skin is warm and dry.  Neurological:     Mental Status: She is alert and oriented to person, place, and time.  Psychiatric:        Mood and Affect: Mood normal.     Labs reviewed: Recent Labs    08/25/17 0802 08/27/17 0632  02/11/18 1548 02/18/18 02/20/18 0700  NA 137 138   < > 132* 132* 131*  K 4.1 4.4   < > 4.4 4.0 4.2  CL 102 107  --  102  --   --   CO2 26 23  --  20*  --   --   GLUCOSE 110* 97  --  174*  --   --   BUN 44* 51*   < > 19 19 21   CREATININE 1.34* 1.29*   < > 1.50* 1.6* 1.5*  CALCIUM 9.1 8.6*  --  8.0*  --   --    < > = values in this interval not displayed.   Recent Labs    08/02/17 1907 08/07/17 0547  02/13/18 02/20/18 0700 02/23/18 0300  AST 21 17   < > 13 35 40*  ALT 13* 12   < > 9 28 34  ALKPHOS 74 71   < > 79 233* 385*  BILITOT 0.6 0.7  --   --   --   --  PROT 6.4* 5.7*  --   --   --   --   ALBUMIN 3.7 2.8*  --   --   --   --    < > = values in this interval not displayed.   Recent Labs    08/02/17 1907  08/07/17 0547 08/15/17 0652  02/11/18 1548 02/18/18 02/20/18  0700  WBC 7.1  --  9.7 8.2   < > 11.4* 27.2 7.3  NEUTROABS 4.9  --  8.0*  --   --   --  26  --   HGB 11.9*   < > 11.2* 11.4*   < > 11.2* 10.5* 11.6*  HCT 36.6   < > 33.1* 35.3*   < > 35.1* 31* 34*  MCV 95.8  --  92.5 93.4  --  88.0  --   --   PLT 204  --  188 301   < > 244 279 255   < > = values in this interval not displayed.   Lab Results  Component Value Date   TSH 2.72 08/29/2017   Lab Results  Component Value Date   HGBA1C 5.3 08/02/2013   Lab Results  Component Value Date   CHOL 89 09/16/2014   HDL 13 (L) 09/16/2014   LDLCALC 40 09/16/2014   LDLDIRECT 85 08/18/2008   TRIG 76 08/05/2017   CHOLHDL 6.8 09/16/2014    Significant Diagnostic Results in last 30 days:  No results found.  Assessment/Plan 1. Herpes zoster without complication Valtrex renal dose 1000 mg qd x 7d Contact isolation until vesicles dry up Call for issues with pain to [place]  2. Wheezing Normal 02 sats with no cough or fever No sick contacts Check CXR 2 view rule out pna vs chf Monitor Vs qshift 72 hrs Duonebs q 6 hrs prn sob/wheeze    Family/ staff Communication: discussed with resident and staff  Labs/tests ordered:  CXR 2 view

## 2018-05-08 NOTE — Progress Notes (Signed)
Carelink Summary Report / Loop Recorder 

## 2018-05-11 DIAGNOSIS — R0602 Shortness of breath: Secondary | ICD-10-CM | POA: Diagnosis not present

## 2018-05-11 DIAGNOSIS — A048 Other specified bacterial intestinal infections: Secondary | ICD-10-CM | POA: Diagnosis not present

## 2018-05-11 DIAGNOSIS — R062 Wheezing: Secondary | ICD-10-CM | POA: Diagnosis not present

## 2018-05-11 DIAGNOSIS — B949 Sequelae of unspecified infectious and parasitic disease: Secondary | ICD-10-CM | POA: Diagnosis not present

## 2018-05-11 DIAGNOSIS — A0819 Acute gastroenteropathy due to other small round viruses: Secondary | ICD-10-CM | POA: Diagnosis not present

## 2018-05-11 DIAGNOSIS — D649 Anemia, unspecified: Secondary | ICD-10-CM | POA: Diagnosis not present

## 2018-05-11 DIAGNOSIS — B89 Unspecified parasitic disease: Secondary | ICD-10-CM | POA: Diagnosis not present

## 2018-05-11 LAB — BASIC METABOLIC PANEL
BUN: 34 — AB (ref 4–21)
Creatinine: 1.4 — AB (ref 0.5–1.1)
Glucose: 121
Potassium: 4.3 (ref 3.4–5.3)
Sodium: 118 — AB (ref 137–147)

## 2018-05-11 LAB — CBC AND DIFFERENTIAL
HCT: 28 — AB (ref 36–46)
Hemoglobin: 9.8 — AB (ref 12.0–16.0)
WBC: 8.7

## 2018-05-11 LAB — HEPATIC FUNCTION PANEL
ALT: 11 (ref 7–35)
AST: 15 (ref 13–35)
Alkaline Phosphatase: 96 (ref 25–125)
Bilirubin, Total: 0.2

## 2018-05-12 ENCOUNTER — Encounter: Payer: Self-pay | Admitting: Internal Medicine

## 2018-05-12 ENCOUNTER — Non-Acute Institutional Stay: Payer: Medicare Other | Admitting: Internal Medicine

## 2018-05-12 DIAGNOSIS — I5033 Acute on chronic diastolic (congestive) heart failure: Secondary | ICD-10-CM | POA: Diagnosis not present

## 2018-05-12 DIAGNOSIS — I4821 Permanent atrial fibrillation: Secondary | ICD-10-CM | POA: Diagnosis not present

## 2018-05-12 DIAGNOSIS — E871 Hypo-osmolality and hyponatremia: Secondary | ICD-10-CM

## 2018-05-12 DIAGNOSIS — D649 Anemia, unspecified: Secondary | ICD-10-CM | POA: Diagnosis not present

## 2018-05-12 DIAGNOSIS — B029 Zoster without complications: Secondary | ICD-10-CM | POA: Diagnosis not present

## 2018-05-12 DIAGNOSIS — E781 Pure hyperglyceridemia: Secondary | ICD-10-CM | POA: Diagnosis not present

## 2018-05-12 NOTE — Progress Notes (Signed)
Patient ID: Sara Russell, female   DOB: 09/14/1930, 85 y.o.   MRN: [id number]  Location:  [place] Room Number: 244 Place of Service:  ALF [phone]) Provider:   [name], DO  Patient Care Team: [name], DO as PCP - General (Geriatric Medicine) Benard Rink (Dentistry) [place], Heather, [place] (Optometry) Sydnee Levans, MD (Dermatology) Thompson Grayer, MD as Consulting Physician (Cardiology) [name], DO as Consulting Physician (Geriatric Medicine)  Extended Emergency Contact Information Primary Emergency Contact: [name] [place] of [place] Phone: [phone] Mobile Phone: [phone] Relation: Son Secondary Emergency Contact: [name] [place] of [place] Mobile Phone: [phone] Relation: Son  Code Status:  DNR, MOST Goals of care: Advanced Directive information Advanced Directives 03/18/2018  Does Patient Have a Medical Advance Directive? Yes  Type of Paramedic of [place];Living will  Does patient want to make changes to medical advance directive? No - Patient declined  Copy of [place] in Chart? Yes - validated most recent copy scanned in chart (See row information)  Would patient like information on creating a medical advance directive? -  Pre-existing out of facility DNR order (yellow form or pink MOST form) Yellow form placed in chart (order not valid for inpatient use);Pink MOST form placed in chart (order not valid for inpatient use)   Chief Complaint  Patient presents with  . 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  NP had seen her and started her on IVFs and lasix along with q 6 h duonebs.  Na improved from 118 to 124.  Pt was more alert and feeling quite a bit better when I saw  her.  Her lungs continued to have coarse wet rhonchi and she had a nonproductive cough.  She continues to have a few residual dried up vesicles from shingles under her right arm.  She was able to get up to use the bathroom today.  She had been feeling so bad yesterday that she requested the priest to come.  Staff permitted her two sons who are local to come see her due to her concerns that she was actively dying (facility with quarantine to prevent covid-19 from entering).  They were present during my visit and asked several questions which were answered.  [name] is quite clear that she does not want to be hospitalized and wants to be kept comfortable here.  She was willing to get labs, fluids and antibiotics.      Past Medical History:  Diagnosis Date  . Abdominal mass   . AKI (acute kidney injury) ([place]) 09/15/2014  . Atrial fibrillation ([place])   . Colitis, ulcerative ([place])   . Cough    WHITE SPUTUM FOR 2 WEEKS, NO FEVER  . Degenerative joint disease   . Diverticulosis   . DVT (deep venous thrombosis) ([place]) 2009   BOTH LUNGS  . GERD (gastroesophageal reflux disease)   . Hyperlipidemia   . Hyperplastic colon polyp   . Hypertension   . Hypothyroidism   . Iron deficiency anemia    ON IRON AT TIMES  . PE (pulmonary embolism) 2009  . Persistent atrial fibrillation   . Positive H. pylori test 01/20/96  . S/P dilatation of esophageal stricture 1992  . Skin cancer  h/o basal and squamous skin cancer--> removed  . Stroke [facility])    Past Surgical History:  Procedure Laterality Date  . APPENDECTOMY    . BREAST EXCISIONAL BIOPSY Left 1992   benign  . BREAST LUMPECTOMY     benign  . DILATION AND CURETTAGE OF UTERUS     for endometrial polyps  . ENDOSCOPIC RETROGRADE CHOLANGIOPANCREATOGRAPHY (ERCP) WITH PROPOFOL N/A 03/23/2015   Procedure: ENDOSCOPIC RETROGRADE CHOLANGIOPANCREATOGRAPHY (ERCP) WITH PROPOFOL;  Surgeon: Milus Banister, MD;  [facility];  Service: Endoscopy;   Laterality: N/A;  . JOINT REPLACEMENT    . LOOP RECORDER INSERTION N/A 10/14/2017   Procedure: LOOP RECORDER INSERTION;  Surgeon: Thompson Grayer, MD;  Location: [place] CV LAB;  Service: Cardiovascular;  Laterality: N/A;  . ORIF ANKLE FRACTURE Left    X 2  . REPLACEMENT TOTAL KNEE     left  . SURGERY DONE TO REMOVE LEFT ANKLE HARDWARE LATER    . TONSILLECTOMY      Allergies  Allergen Reactions  . Iohexol Hives  . Ivp Dye [Iodinated Diagnostic Agents] Hives    Was able to tolerate Contrast Media for CT scan.  [name] [Shellfish Allergy] Nausea And Vomiting    Projectile vomiting  . Shellfish-Derived Products Nausea And Vomiting  . Sulfa Antibiotics Hives  . Sulfamethoxazole Hives    Outpatient Encounter Medications as of 05/12/2018  Medication Sig  . acetaminophen (TYLENOL) 325 MG tablet Take 650 mg by mouth 2 (two) times daily.  Marland Kitchen acetaminophen (TYLENOL) 500 MG tablet Take 500 mg by mouth 2 (two) times daily.  Marland Kitchen amiodarone (PACERONE) 100 MG tablet Take 1 tablet (100 mg total) by mouth daily.  Marland Kitchen aspirin 81 MG tablet Take 1 tablet (81 mg total) by mouth daily.  Marland Kitchen atorvastatin (LIPITOR) 10 MG tablet Take 10 mg by mouth daily.  . carvedilol (COREG) 6.25 MG tablet Take 3 tablets (18.75 mg total) by mouth 2 (two) times daily with a meal.  . gabapentin (NEURONTIN) 100 MG capsule Take 100 mg by mouth 2 (two) times daily.  . Hypromellose (GONIOVISC) 2 % SOLN Place 1 drop into both eyes daily as needed.  Marland Kitchen ipratropium-albuterol (DUONEB) 0.5-2.5 (3) MG/3ML SOLN Take 3 mLs by nebulization every 6 (six) hours as needed.  Marland Kitchen levothyroxine (SYNTHROID, LEVOTHROID) 75 MCG tablet Take 1 tablet (75 mcg total) by mouth daily.  . Lidocaine, Anorectal, (RECTICARE) 5 % CREA Apply 1 application topically 2 (two) times daily.   . Melatonin 5 MG TABS Take 5 mg by mouth at bedtime.  . Menthol-Methyl Salicylate (ICY HOT EXTRA STRENGTH) 10-30 % CREA Apply topically 2 (two) times daily.  . ondansetron  (ZOFRAN) 4 MG tablet Take 4 mg by mouth every 6 (six) hours as needed for nausea or vomiting.  . pantoprazole (PROTONIX) 40 MG tablet Take 40 mg by mouth daily.  . potassium chloride 20 MEQ/15ML (10%) SOLN Take 7.5 mLs (10 mEq total) by mouth daily.  . Simethicone (GAS RELIEF PO) Take 1 tablet by mouth 2 (two) times daily.  Marland Kitchen tiZANidine (ZANAFLEX) 2 MG tablet Take 1 tablet (2 mg total) by mouth 4 (four) times daily -  with meals and at bedtime.  . valACYclovir (VALTREX) 1000 MG tablet Take 1,000 mg by mouth daily.  . WITCH HAZEL EX Apply topically as needed.   No facility-administered encounter medications on file as of 05/12/2018.     Review of Systems  Constitutional: Positive for activity change, appetite change and fatigue. Negative for  chills and fever.  HENT: Negative for congestion.   Respiratory: Positive for cough and wheezing. Negative for chest tightness and shortness of breath.   Cardiovascular: Negative for chest pain, palpitations and leg swelling.  Gastrointestinal: Negative for abdominal pain.  Genitourinary: Negative for flank pain.  Musculoskeletal:       Pain over ribs on right at shingles site  Skin: Positive for pallor.  Neurological: Positive for speech difficulty and weakness.  Psychiatric/Behavioral: Positive for confusion. Negative for sleep disturbance. The patient is not nervous/anxious.     Immunization History  Administered Date(s) Administered  . H1N1 03/04/2008  . Influenza Split 11/12/2010, 11/26/2011  . Influenza Whole 11/26/2006, 11/03/2007, 12/10/2007, 11/16/2008, 11/20/2009  . Influenza,inj,Quad PF,6+ Mos 11/24/2012, 11/29/2013, 10/14/2014, 11/28/2015, 12/04/2016, 12/11/2017  . PPD Test 10/03/2014  . Pneumococcal Conjugate-13 09/10/2013  . Pneumococcal Polysaccharide-23 02/12/2000  . Td 02/11/1998, 04/13/2008  . Zoster 05/08/2006   Pertinent  Health Maintenance Due  Topic Date Due  . INFLUENZA VACCINE  Completed  . DEXA SCAN  Completed  .  PNA vac Low Risk Adult  Completed   Fall Risk  03/18/2018 11/05/2017 09/24/2017 02/12/2017 01/27/2017  Falls in the past year? 0 No No No No  Number falls in past yr: 0 - - - -  Comment - - - - -  Injury with Fall? 0 - - - -   Functional Status Survey:    Vitals:   05/12/18 0927  BP: 136/64  Pulse: 60  Resp: 17  Temp: 98.2 F (36.8 C)  TempSrc: Oral  SpO2: 96%  Weight: 135 lb (61.2 kg)  Height: 5' (1.524 m)   Body mass index is 26.37 kg/m. Physical Exam Vitals signs reviewed.  Constitutional:      General: She is not in acute distress.    Appearance: She is ill-appearing. She is not diaphoretic.  HENT:     Head: Normocephalic and atraumatic.     Nose: No congestion.  Eyes:     Comments: Pale conjunctivae  Cardiovascular:     Heart sounds: Friction rub present.     Comments: irreg irreg Pulmonary:     Breath sounds: Wheezing and rales present.  Abdominal:     General: Bowel sounds are normal.  Musculoskeletal:     Right lower leg: No edema.     Left lower leg: No edema.     Comments: Limited ROM right arm  Skin:    Coloration: Skin is pale.     Comments: Grayish skin tone; a few remaining scabbed over vesicles midaxillary line on right  Neurological:     Mental Status: She is alert.     Comments: Expressive aphasia but does speak and communicate fairly well  Psychiatric:        Mood and Affect: Mood normal.     Labs reviewed: Recent Labs    08/25/17 0802 08/27/17 0632  02/11/18 1548 02/18/18 02/20/18 0700 05/11/18 0100  NA 137 138   < > 132* 132* 131* 118*  K 4.1 4.4   < > 4.4 4.0 4.2 4.3  CL 102 107  --  102  --   --   --   CO2 26 23  --  20*  --   --   --   GLUCOSE 110* 97  --  174*  --   --   --   BUN 44* 51*   < > 19 19 21  34*  CREATININE 1.34* 1.29*   < > 1.50* 1.6* 1.5*  1.4*  CALCIUM 9.1 8.6*  --  8.0*  --   --   --    < > = values in this interval not displayed.   Recent Labs    08/02/17 1907 08/07/17 0547  02/20/18 0700 02/23/18 0300  05/11/18 0100  AST 21 17   < > 35 40* 15  ALT 13* 12   < > 28 34 11  ALKPHOS 74 71   < > 233* 385* 96  BILITOT 0.6 0.7  --   --   --   --   PROT 6.4* 5.7*  --   --   --   --   ALBUMIN 3.7 2.8*  --   --   --   --    < > = values in this interval not displayed.   Recent Labs    08/02/17 1907  08/07/17 0547 08/15/17 0652  02/11/18 1548 02/18/18 02/20/18 0700 05/11/18 0100  WBC 7.1  --  9.7 8.2   < > 11.4* 27.2 7.3 8.7  NEUTROABS 4.9  --  8.0*  --   --   --  26  --   --   HGB 11.9*   < > 11.2* 11.4*   < > 11.2* 10.5* 11.6* 9.8*  HCT 36.6   < > 33.1* 35.3*   < > 35.1* 31* 34* 28*  MCV 95.8  --  92.5 93.4  --  88.0  --   --   --   PLT 204  --  188 301   < > 244 279 255  --    < > = values in this interval not displayed.   Lab Results  Component Value Date   TSH 2.72 08/29/2017   Lab Results  Component Value Date   HGBA1C 5.3 08/02/2013   Lab Results  Component Value Date   CHOL 89 09/16/2014   HDL 13 (L) 09/16/2014   LDLCALC 40 09/16/2014   LDLDIRECT 85 08/18/2008   TRIG 76 08/05/2017   CHOLHDL 6.8 09/16/2014   Xrays and labs from the past two days reviewed.  Assessment/Plan 1. Hyponatremia with excess extracellular fluid volume -seems she is volume-overloaded but sodium levels and poor intake suggest volume-depletion -will repeat another liter of NS at 80cc/hr AND lasix 20m -repeat bmp when fluids completed and reassess from there -need to gradually increase sodium to avoid CPM  2. Acute on chronic diastolic CHF (congestive heart failure), NYHA class 2 (HCC) -seems to be etiology of dry cough and rales; however, may also have infection present--no leukocytosis or fever present (may be due to frail status causing immunocompromise, lack of classic response AND on scheduled tylenol  3. Permanent atrial fibrillation -cont amiodarone and coreg; not on anticoagulation due to prior thalamic hemorrhage--only on baby asa  4. Herpes zoster without complication -near  resolution--has been on droplet precautions for this and now her respiratory symptoms  Family/ staff Communication: discussed with two sons, JClair Gullingand [name] who were present at time of visit  Labs/tests ordered:  Bmp after next liter of saline  Chadd Tollison L. Nashira Mcglynn, D.O. [facility] [address] [place] [id number] Phone (Mon-Fri 8am-5pm):  [phone] Call:  [phone]& follow prompts after 5pm & weekends Office Phone:  [phone] Fax:  [phone]

## 2018-05-13 DIAGNOSIS — E860 Dehydration: Secondary | ICD-10-CM | POA: Diagnosis not present

## 2018-05-13 DIAGNOSIS — D649 Anemia, unspecified: Secondary | ICD-10-CM | POA: Diagnosis not present

## 2018-05-13 LAB — BASIC METABOLIC PANEL
BUN: 26 — AB (ref 4–21)
Creatinine: 1.3 — AB (ref 0.5–1.1)
Glucose: 144
Potassium: 3.8 (ref 3.4–5.3)
Sodium: 130 — AB (ref 137–147)

## 2018-05-14 ENCOUNTER — Non-Acute Institutional Stay: Payer: Medicare Other | Admitting: Adult Health

## 2018-05-14 ENCOUNTER — Encounter: Payer: Self-pay | Admitting: Adult Health

## 2018-05-14 DIAGNOSIS — D631 Anemia in chronic kidney disease: Secondary | ICD-10-CM | POA: Diagnosis not present

## 2018-05-14 DIAGNOSIS — J189 Pneumonia, unspecified organism: Secondary | ICD-10-CM

## 2018-05-14 DIAGNOSIS — B028 Zoster with other complications: Secondary | ICD-10-CM

## 2018-05-14 DIAGNOSIS — N183 Chronic kidney disease, stage 3 unspecified: Secondary | ICD-10-CM

## 2018-05-14 DIAGNOSIS — R531 Weakness: Secondary | ICD-10-CM | POA: Diagnosis not present

## 2018-05-14 DIAGNOSIS — E871 Hypo-osmolality and hyponatremia: Secondary | ICD-10-CM | POA: Diagnosis not present

## 2018-05-14 NOTE — Progress Notes (Signed)
Location:  Occupational psychologist of Service:  ALF (13) Provider:   Cindi Russell, ANP Sara Russell   Sara Curry, DO  Patient Care Team: Sara Curry, DO as PCP - General (Geriatric Medicine) Sara Russell (Dentistry) Sara Russell, Sara Russell, Sara Russell (Optometry) Sara Levans, Russell (Dermatology) Sara Grayer, Russell as Consulting Physician (Cardiology) Sara Curry, DO as Consulting Physician (Geriatric Medicine)  Extended Emergency Contact Information Primary Emergency Contact: Sara Russell States of [place] Phone: [phone] Mobile Phone: [phone] Relation: Son Secondary Emergency Contact: Sara Russell States of [place] Mobile Phone: [phone] Relation: Son  Code Status:  DNR Goals of care: Advanced Directive information Advanced Directives 05/12/2018  Does Patient Have a Medical Advance Directive? Yes  Type of Paramedic of [place];Living will;Out of facility DNR (pink MOST or yellow form)  Does patient want to make changes to medical advance directive? No - Patient declined  Copy of [place] in Chart? Yes - validated most recent copy scanned in chart (See row information)  Would patient like information on creating a medical advance directive? -  Pre-existing out of facility DNR order (yellow form or pink MOST form) Pink MOST form placed in chart (order not valid for inpatient use)     Chief Complaint  Patient presents with  . Acute Visit    f/u hyponatremia, cough, shingles    HPI:  Pt is a 85 y.o. female seen today for follow up regarding hyponatremia, cough, and shingles.  On 3/26 she was seen for wheezing and painful rash. Diagnosed with herpes zoster and placed on Valtrex. Prescribed duonebs and a chest xray which was neg (but xray tech only did 1 view).  She continued with cough and wheeze and an additional xray was ordered on 3/30 which showed  bilateral infiltrates CHF vs pna. BMP returned with a NA of 118.  BNP returned at 198.  She has been treated with NS 3 liters and oral lasix 40 mg qd x 3 days. She continues to have cough and wheezing. Sats are in the mid 90's with no sob. No fever. No sick contacts. She remains weak but seems stronger each day. Appetite is fair. She still feels "awful".     Her calcium was running slightly low at 7.5. Ionized calcium pending.   Past Medical History:  Diagnosis Date  . Abdominal mass   . AKI (acute kidney injury) (Sara Russell) 09/15/2014  . Atrial fibrillation (Sara Russell)   . Colitis, ulcerative (Sara Russell)   . Cough    WHITE SPUTUM FOR 2 WEEKS, NO FEVER  . Degenerative joint disease   . Diverticulosis   . DVT (deep venous thrombosis) (Sara Russell) 2009   BOTH LUNGS  . GERD (gastroesophageal reflux disease)   . Hyperlipidemia   . Hyperplastic colon polyp   . Hypertension   . Hypothyroidism   . Iron deficiency anemia    ON IRON AT TIMES  . PE (pulmonary embolism) 2009  . Persistent atrial fibrillation   . Positive Sara Russell 01/20/96  . S/P dilatation of esophageal stricture 1992  . Skin cancer    h/o basal and squamous skin cancer--> removed  . Stroke Sara Russell)    Past Surgical History:  Procedure Laterality Date  . APPENDECTOMY    . BREAST EXCISIONAL BIOPSY Left 1992   benign  . BREAST LUMPECTOMY     benign  . DILATION AND CURETTAGE OF UTERUS     for endometrial polyps  .  ENDOSCOPIC RETROGRADE CHOLANGIOPANCREATOGRAPHY (ERCP) WITH PROPOFOL N/A 03/23/2015   Procedure: ENDOSCOPIC RETROGRADE CHOLANGIOPANCREATOGRAPHY (ERCP) WITH PROPOFOL;  Surgeon: Sara Russell;  Location: [facility];  Service: Russell;  Laterality: N/A;  . JOINT REPLACEMENT    . LOOP RECORDER INSERTION N/A 10/14/2017   Procedure: LOOP RECORDER INSERTION;  Surgeon: Sara Grayer, Russell;  Location: Sara Russell;  Service: Cardiovascular;  Laterality: N/A;  . ORIF ANKLE FRACTURE Left    X 2  . REPLACEMENT TOTAL KNEE     left   . SURGERY DONE TO REMOVE LEFT ANKLE HARDWARE LATER    . TONSILLECTOMY      Allergies  Allergen Reactions  . Iohexol Hives  . Ivp Dye [Iodinated Diagnostic Agents] Hives    Was able to tolerate Contrast Media for CT scan.  Sara Russell [Shellfish Allergy] Nausea And Vomiting    Projectile vomiting  . Shellfish-Derived Products Nausea And Vomiting  . Sulfa Antibiotics Hives  . Sulfamethoxazole Hives    Outpatient Encounter Medications as of 05/14/2018  Medication Sig  . acetaminophen (TYLENOL) 500 MG tablet Take 1,000 mg by mouth 2 (two) times daily.   Marland Kitchen amiodarone (PACERONE) 100 MG tablet Take 1 tablet (100 mg total) by mouth daily.  Marland Kitchen aspirin 81 MG tablet Take 1 tablet (81 mg total) by mouth daily.  Marland Kitchen atorvastatin (LIPITOR) 10 MG tablet Take 10 mg by mouth daily.  . carvedilol (COREG) 6.25 MG tablet Take 3 tablets (18.75 mg total) by mouth 2 (two) times daily with a meal.  . gabapentin (NEURONTIN) 100 MG capsule Take 100 mg by mouth 2 (two) times daily.  . Hypromellose (GONIOVISC) 2 % SOLN Place 1 drop into both eyes daily as needed.  Marland Kitchen ipratropium-albuterol (DUONEB) 0.5-2.5 (3) MG/3ML SOLN Take 3 mLs by nebulization every 6 (six) hours as needed.  Marland Kitchen levothyroxine (SYNTHROID, LEVOTHROID) 75 MCG tablet Take 1 tablet (75 mcg total) by mouth daily.  . Lidocaine, Anorectal, (RECTICARE) 5 % CREA Apply 1 application topically 2 (two) times daily.   . Melatonin 5 MG TABS Take 5 mg by mouth at bedtime.  . Menthol-Methyl Salicylate (ICY HOT EXTRA STRENGTH) 10-30 % CREA Apply topically 2 (two) times daily.  . ondansetron (ZOFRAN) 4 MG tablet Take 4 mg by mouth every 6 (six) hours as needed for nausea or vomiting.  . pantoprazole (PROTONIX) 40 MG tablet Take 40 mg by mouth daily.  . potassium chloride 20 MEQ/15ML (10%) SOLN Take 7.5 mLs (10 mEq total) by mouth daily.  . Simethicone (GAS RELIEF PO) Take 1 tablet by mouth 2 (two) times daily.  Marland Kitchen tiZANidine (ZANAFLEX) 2 MG tablet Take 1 tablet (2 mg  total) by mouth 4 (four) times daily -  with meals and at bedtime.  . valACYclovir (VALTREX) 1000 MG tablet Take 1,000 mg by mouth daily.  . WITCH HAZEL EX Apply topically as needed.  . [DISCONTINUED] acetaminophen (TYLENOL) 325 MG tablet Take 650 mg by mouth 2 (two) times daily.   No facility-administered encounter medications on file as of 05/14/2018.     Review of Systems  Constitutional: Positive for activity change and fatigue. Negative for appetite change, chills, diaphoresis, fever and unexpected weight change.  HENT: Negative for congestion.   Eyes: Negative for photophobia and visual disturbance.  Respiratory: Positive for cough and wheezing. Negative for shortness of breath.   Cardiovascular: Negative for chest pain, palpitations and leg swelling.  Gastrointestinal: Negative for abdominal distention, abdominal pain, constipation and diarrhea.  Genitourinary: Negative for  difficulty urinating and dysuria.  Musculoskeletal: Positive for gait problem. Negative for arthralgias, back pain, joint swelling and myalgias.  Neurological: Positive for weakness. Negative for dizziness, tremors, seizures, syncope, facial asymmetry, speech difficulty, light-headedness, numbness and headaches.  Psychiatric/Behavioral: Positive for confusion. Negative for agitation and behavioral problems.    Immunization History  Administered Date(s) Administered  . H1N1 03/04/2008  . Influenza Split 11/12/2010, 11/26/2011  . Influenza Whole 11/26/2006, 11/03/2007, 12/10/2007, 11/16/2008, 11/20/2009  . Influenza,inj,Quad PF,6+ Mos 11/24/2012, 11/29/2013, 10/14/2014, 11/28/2015, 12/04/2016, 12/11/2017  . PPD Russell 10/03/2014  . Pneumococcal Conjugate-13 09/10/2013  . Pneumococcal Polysaccharide-23 02/12/2000  . Td 02/11/1998, 04/13/2008  . Zoster 05/08/2006   Pertinent  Health Maintenance Due  Topic Date Due  . INFLUENZA VACCINE  09/12/2018  . DEXA SCAN  Completed  . PNA vac Low Risk Adult  Completed    Fall Risk  03/18/2018 11/05/2017 09/24/2017 02/12/2017 01/27/2017  Falls in the past year? 0 No No No No  Number falls in past yr: 0 - - - -  Comment - - - - -  Injury with Fall? 0 - - - -   Functional Status Survey:    Vitals:   05/14/18 1321  BP: 110/66  Pulse: 64  Resp: 18  Temp: (!) 97 F (36.1 C)  SpO2: 95%   There is no height or weight on file to calculate BMI. Physical Exam Constitutional:      Appearance: She is normal weight.  HENT:     Head: Normocephalic and atraumatic.     Nose: No congestion.     Mouth/Throat:     Pharynx: No oropharyngeal exudate.  Eyes:     Conjunctiva/sclera: Conjunctivae normal.     Pupils: Pupils are equal, round, and reactive to light.  Cardiovascular:     Rate and Rhythm: Normal rate and regular rhythm.     Heart sounds: No murmur.  Pulmonary:     Effort: Pulmonary effort is normal. No respiratory distress.     Breath sounds: Wheezing and rhonchi present.  Abdominal:     General: Abdomen is flat. Bowel sounds are normal.     Palpations: Abdomen is soft.  Musculoskeletal:     Right lower leg: No edema.     Left lower leg: No edema.  Lymphadenopathy:     Cervical: No cervical adenopathy.  Skin:    General: Skin is warm and dry.     Comments: Rash to right upper back and arm area is flat with no vesicles.   Neurological:     Mental Status: She is alert.     Cranial Nerves: No cranial nerve deficit.     Comments: Oriented x 2. Able to f/c     Labs reviewed: Recent Labs    08/25/17 0802 08/27/17 0632  02/11/18 1548  02/20/18 0700 05/11/18 0100 05/13/18  NA 137 138   < > 132*   < > 131* 118* 130*  K 4.1 4.4   < > 4.4   < > 4.2 4.3 3.8  CL 102 107  --  102  --   --   --   --   CO2 26 23  --  20*  --   --   --   --   GLUCOSE 110* 97  --  174*  --   --   --   --   BUN 44* 51*   < > 19   < > 21 34* 26*  CREATININE 1.34* 1.29*   < >  1.50*   < > 1.5* 1.4* 1.3*  CALCIUM 9.1 8.6*  --  8.0*  --   --   --   --    < > = values in  this interval not displayed.   Recent Labs    08/02/17 1907 08/07/17 0547  02/20/18 0700 02/23/18 0300 05/11/18 0100  AST 21 17   < > 35 40* 15  ALT 13* 12   < > 28 34 11  ALKPHOS 74 71   < > 233* 385* 96  BILITOT 0.6 0.7  --   --   --   --   PROT 6.4* 5.7*  --   --   --   --   ALBUMIN 3.7 2.8*  --   --   --   --    < > = values in this interval not displayed.   Recent Labs    08/02/17 1907  08/07/17 0547 08/15/17 0652  02/11/18 1548 02/18/18 02/20/18 0700 05/11/18 0100  WBC 7.1  --  9.7 8.2   < > 11.4* 27.2 7.3 8.7  NEUTROABS 4.9  --  8.0*  --   --   --  26  --   --   HGB 11.9*   < > 11.2* 11.4*   < > 11.2* 10.5* 11.6* 9.8*  HCT 36.6   < > 33.1* 35.3*   < > 35.1* 31* 34* 28*  MCV 95.8  --  92.5 93.4  --  88.0  --   --   --   PLT 204  --  188 301   < > 244 279 255  --    < > = values in this interval not displayed.   Russell Results  Component Value Date   TSH 2.72 08/29/2017   Russell Results  Component Value Date   HGBA1C 5.3 08/02/2013   Russell Results  Component Value Date   CHOL 89 09/16/2014   HDL 13 (L) 09/16/2014   LDLCALC 40 09/16/2014   LDLDIRECT 85 08/18/2008   TRIG 76 08/05/2017   CHOLHDL 6.8 09/16/2014    Significant Diagnostic Results in last 30 days:  No results found.  Assessment/Plan 1. Hyponatremia Improved. Awaiting BMP for 05/14/2018 More alert/stronger. ?underlying cause possibly due to acute infection  2. HCAP (healthcare-associated pneumonia) Will treat as pna given xray findings suggestive of CHF vs pna with a BNP of 198.  Cough and wheezing continue.  Levaquin 500 mg on day 1, then 250 mg qd x 6 more days Florastor 1 cap bid x 7 days IS 10 breath QID  Encourage mobility Continue Duonebs q 6 hrs prn Continue isolation for now due to cough/shingles  3. Anemia due to stage 3 chronic kidney disease (HCC) Worsening on labs, possibly exacerbated by acute illness  4. Weakness Due to acute illness. Staff to encourage in room ambulation.  Consider PT if needed next week and off isolation.  5. Herpes zoster with other complication Pain and rash improved. Continue valtrex to complete 7 day course and neurontin for pain.  I called her son [name] and discussed the plan of care. He is in agreement.   Her calcium may be artificially low due to low albumin. CMP and Ionized Ca pending at the time of this note.   Family/ staff Communication: son Garth Schlatter ordered:  BMP TSH CBC 05/18/18

## 2018-05-18 ENCOUNTER — Telehealth: Payer: Self-pay

## 2018-05-18 ENCOUNTER — Encounter: Payer: Self-pay | Admitting: Internal Medicine

## 2018-05-18 DIAGNOSIS — E039 Hypothyroidism, unspecified: Secondary | ICD-10-CM | POA: Diagnosis not present

## 2018-05-18 DIAGNOSIS — D509 Iron deficiency anemia, unspecified: Secondary | ICD-10-CM | POA: Diagnosis not present

## 2018-05-18 DIAGNOSIS — D649 Anemia, unspecified: Secondary | ICD-10-CM | POA: Diagnosis not present

## 2018-05-18 DIAGNOSIS — E871 Hypo-osmolality and hyponatremia: Secondary | ICD-10-CM | POA: Diagnosis not present

## 2018-05-18 LAB — TSH: TSH: 5.16 (ref 0.41–5.90)

## 2018-05-18 NOTE — Telephone Encounter (Addendum)
Patient's son, Remo Lipps called requesting a letter stating patient is unable to take care of her financial affairs. Remo Lipps would like this letter emailed to him at: [email or web address] if possible  Please advise

## 2018-05-18 NOTE — Telephone Encounter (Signed)
I have sent a letter via scanning before. We can not guarantee the delivery through this method.

## 2018-05-18 NOTE — Telephone Encounter (Signed)
Happy to do the letter.  Are we able to email letters thru epic though?  I've never done that.

## 2018-05-19 NOTE — Telephone Encounter (Signed)
[name], DO  [place], [place], [place] I "done'd" Starla Deller letter before actually doing it. Can you please make sure the letter gets emailed or somehow sent to [name]? I've printed it and put it in your space. Thanks, [employer] as requested. Spoke with Remo Lipps and informed him letter emailed

## 2018-05-19 NOTE — Telephone Encounter (Signed)
Son did not get email, I advised I would mail letter, he is ok with that.

## 2018-05-20 DIAGNOSIS — D649 Anemia, unspecified: Secondary | ICD-10-CM | POA: Diagnosis not present

## 2018-05-20 DIAGNOSIS — E871 Hypo-osmolality and hyponatremia: Secondary | ICD-10-CM | POA: Diagnosis not present

## 2018-05-22 DIAGNOSIS — D649 Anemia, unspecified: Secondary | ICD-10-CM | POA: Diagnosis not present

## 2018-05-22 DIAGNOSIS — R1030 Lower abdominal pain, unspecified: Secondary | ICD-10-CM | POA: Diagnosis not present

## 2018-05-22 DIAGNOSIS — Z79899 Other long term (current) drug therapy: Secondary | ICD-10-CM | POA: Diagnosis not present

## 2018-05-23 DIAGNOSIS — D649 Anemia, unspecified: Secondary | ICD-10-CM | POA: Diagnosis not present

## 2018-05-23 DIAGNOSIS — E871 Hypo-osmolality and hyponatremia: Secondary | ICD-10-CM | POA: Diagnosis not present

## 2018-05-25 DIAGNOSIS — R05 Cough: Secondary | ICD-10-CM | POA: Diagnosis not present

## 2018-05-25 DIAGNOSIS — J189 Pneumonia, unspecified organism: Secondary | ICD-10-CM | POA: Diagnosis not present

## 2018-05-27 DIAGNOSIS — M25511 Pain in right shoulder: Secondary | ICD-10-CM | POA: Diagnosis not present

## 2018-05-27 DIAGNOSIS — R41844 Frontal lobe and executive function deficit: Secondary | ICD-10-CM | POA: Diagnosis not present

## 2018-05-27 DIAGNOSIS — I69151 Hemiplegia and hemiparesis following nontraumatic intracerebral hemorrhage affecting right dominant side: Secondary | ICD-10-CM | POA: Diagnosis not present

## 2018-05-27 DIAGNOSIS — R296 Repeated falls: Secondary | ICD-10-CM | POA: Diagnosis not present

## 2018-05-27 DIAGNOSIS — M79601 Pain in right arm: Secondary | ICD-10-CM | POA: Diagnosis not present

## 2018-05-27 DIAGNOSIS — W010XXS Fall on same level from slipping, tripping and stumbling without subsequent striking against object, sequela: Secondary | ICD-10-CM | POA: Diagnosis not present

## 2018-05-27 DIAGNOSIS — I69315 Cognitive social or emotional deficit following cerebral infarction: Secondary | ICD-10-CM | POA: Diagnosis not present

## 2018-06-02 ENCOUNTER — Other Ambulatory Visit: Payer: Self-pay

## 2018-06-02 ENCOUNTER — Ambulatory Visit (INDEPENDENT_AMBULATORY_CARE_PROVIDER_SITE_OTHER): Payer: Medicare Other | Admitting: *Deleted

## 2018-06-02 DIAGNOSIS — I639 Cerebral infarction, unspecified: Secondary | ICD-10-CM | POA: Diagnosis not present

## 2018-06-02 DIAGNOSIS — I4819 Other persistent atrial fibrillation: Secondary | ICD-10-CM

## 2018-06-03 LAB — CUP PACEART REMOTE DEVICE CHECK
Date Time Interrogation Session: 20200421200534
Implantable Pulse Generator Implant Date: 20190903

## 2018-06-06 DIAGNOSIS — Z20828 Contact with and (suspected) exposure to other viral communicable diseases: Secondary | ICD-10-CM | POA: Diagnosis not present

## 2018-06-09 NOTE — Progress Notes (Signed)
Carelink Summary Report / Loop Recorder 

## 2018-06-25 ENCOUNTER — Ambulatory Visit: Payer: Medicare Other | Admitting: Adult Health

## 2018-07-07 ENCOUNTER — Encounter: Payer: Medicare Other | Admitting: *Deleted

## 2018-07-13 DIAGNOSIS — E039 Hypothyroidism, unspecified: Secondary | ICD-10-CM | POA: Diagnosis not present

## 2018-07-13 LAB — TSH: TSH: 1.46 (ref 0.41–5.90)

## 2018-07-15 ENCOUNTER — Encounter: Payer: Self-pay | Admitting: Internal Medicine

## 2018-07-22 DIAGNOSIS — Z20828 Contact with and (suspected) exposure to other viral communicable diseases: Secondary | ICD-10-CM | POA: Diagnosis not present

## 2018-07-29 ENCOUNTER — Encounter: Payer: Medicare Other | Admitting: Internal Medicine

## 2018-07-29 ENCOUNTER — Other Ambulatory Visit: Payer: Self-pay

## 2018-07-29 ENCOUNTER — Telehealth: Payer: Self-pay | Admitting: *Deleted

## 2018-07-29 NOTE — Telephone Encounter (Signed)
Patient son, [name] called and left message on Clinical Intake Voicemail stating that he was not able to be at patient's appointment today due to Visitation Restrictions and wants Dr. Mariea Clonts to call him to update him on the appointment.  Would like a call [phone]

## 2018-07-29 NOTE — Telephone Encounter (Signed)
Son will call patient.  Son also wanted a copy of patient's MOST Form mailed to him. Confirmed address and mailed copy.

## 2018-07-29 NOTE — Telephone Encounter (Signed)
LMOM to return call.

## 2018-07-29 NOTE — Telephone Encounter (Signed)
Patient did not come to her appt.

## 2018-08-06 ENCOUNTER — Telehealth: Payer: Self-pay

## 2018-08-06 ENCOUNTER — Telehealth: Payer: Self-pay | Admitting: Internal Medicine

## 2018-08-06 NOTE — Telephone Encounter (Signed)
Follow up   Sara Russell from [facility] is returning call. Please call.

## 2018-08-06 NOTE — Telephone Encounter (Signed)
Follow up:     Loren from [facility] returning your call back concering this patient.

## 2018-08-06 NOTE — Telephone Encounter (Signed)
Left a message for Sara Russell at [facility] to call me back.

## 2018-08-06 NOTE — Telephone Encounter (Signed)
Spoke with pts son regarding appt on 08/07/18. Pts son stated his mother is at [place] and I should give them a call. I spoke with Sara Russell nurse [facility]) regarding appt. She was advise that Sara Russell appt will be virtual and her son would like to be included in her appt. Sara Russell stated she will check to see if this is possible and call me back

## 2018-08-06 NOTE — Telephone Encounter (Signed)
Spoke with Edwena Felty at [facility] regarding pts appt on 08/07/18. Edwena Felty stated pt will not be able to do a MyChart video, but can do a Facetime. She also stated she will check pts vitals prior to appt.

## 2018-08-06 NOTE — Telephone Encounter (Signed)
New Message     COVID SCREENING QUESTIONS FOR IN-OFFICE VISITS - PLEASE DOCUMENT PATIENT ANSWERS  1. Do you currently have a fever?  A. NO  2. Have you recently traveled on a cruise, internationally, or to [place], [place], [place], [place], [place], or [place], [place] [employer])?  a. NO  3. Have you been in contact with someone that is currently pending confirmation of Covid19 testing or has been confirmed to have the [place] virus?  a. NO  4. Are you currently experiencing fatigue or cough?  a. NO   Pts son will be with the pt at her appt because she is in need of assistance

## 2018-08-07 ENCOUNTER — Ambulatory Visit (INDEPENDENT_AMBULATORY_CARE_PROVIDER_SITE_OTHER): Payer: Medicare Other | Admitting: *Deleted

## 2018-08-07 ENCOUNTER — Other Ambulatory Visit: Payer: Self-pay

## 2018-08-07 ENCOUNTER — Encounter: Payer: Self-pay | Admitting: Internal Medicine

## 2018-08-07 ENCOUNTER — Telehealth (INDEPENDENT_AMBULATORY_CARE_PROVIDER_SITE_OTHER): Payer: Medicare Other | Admitting: Internal Medicine

## 2018-08-07 VITALS — BP 146/74 | HR 82 | Ht 60.0 in | Wt 126.0 lb

## 2018-08-07 DIAGNOSIS — I4819 Other persistent atrial fibrillation: Secondary | ICD-10-CM | POA: Diagnosis not present

## 2018-08-07 DIAGNOSIS — I10 Essential (primary) hypertension: Secondary | ICD-10-CM

## 2018-08-07 DIAGNOSIS — I639 Cerebral infarction, unspecified: Secondary | ICD-10-CM | POA: Diagnosis not present

## 2018-08-07 NOTE — Progress Notes (Signed)
Electrophysiology TeleHealth Note   Due to national recommendations of social distancing due to COVID 19, an audio/video telehealth visit is felt to be most appropriate for this patient at this time.  Verbal consent was obtained by me today.  The patients caregiver and son were on the call today.  We used facetime.   Date:  08/07/2018   ID:  Sara Russell, DOB 03-13-1930, MRN [id number]  Location: patient's home  Provider location:  [facility]  Evaluation Performed: Follow-up visit  PCP:  [name], DO   Electrophysiologist:  Dr [name]  Chief Complaint:  palpitations  History of Present Illness:    Sara Russell is a 85 y.o. female who presents via telehealth conferencing today.  Since last being seen in our clinic, the patient reports doing very well.  Today, she denies symptoms of palpitations, chest pain, shortness of breath,  lower extremity edema, dizziness, presyncope, or syncope.  The patient is otherwise without complaint today.  The patient denies symptoms of fevers, chills, cough, or new SOB worrisome for COVID 19.  Past Medical History:  Diagnosis Date  . Abdominal mass   . AKI (acute kidney injury) ([place]) 09/15/2014  . Atrial fibrillation ([place])   . Colitis, ulcerative ([place])   . Cough    WHITE SPUTUM FOR 2 WEEKS, NO FEVER  . Degenerative joint disease   . Diverticulosis   . DVT (deep venous thrombosis) ([place]) 2009   BOTH LUNGS  . GERD (gastroesophageal reflux disease)   . Hyperlipidemia   . Hyperplastic colon polyp   . Hypertension   . Hypothyroidism   . Iron deficiency anemia    ON IRON AT TIMES  . PE (pulmonary embolism) 2009  . Persistent atrial fibrillation   . Positive H. pylori test 01/20/96  . S/P dilatation of esophageal stricture 1992  . Skin cancer    h/o basal and squamous skin cancer--> removed  . Stroke [facility])     Past Surgical History:  Procedure Laterality Date  . APPENDECTOMY    . BREAST EXCISIONAL BIOPSY Left 1992   benign  . BREAST LUMPECTOMY     benign  . DILATION AND CURETTAGE OF UTERUS     for endometrial polyps  . ENDOSCOPIC RETROGRADE CHOLANGIOPANCREATOGRAPHY (ERCP) WITH PROPOFOL N/A 03/23/2015   Procedure: ENDOSCOPIC RETROGRADE CHOLANGIOPANCREATOGRAPHY (ERCP) WITH PROPOFOL;  Surgeon: Milus Banister, MD;  Location: [facility];  Service: Endoscopy;  Laterality: N/A;  . JOINT REPLACEMENT    . LOOP RECORDER INSERTION N/A 10/14/2017   Procedure: LOOP RECORDER INSERTION;  Surgeon: Thompson Grayer, MD;  Location: [facility];  Service: Cardiovascular;  Laterality: N/A;  . ORIF ANKLE FRACTURE Left    X 2  . REPLACEMENT TOTAL KNEE     left  . SURGERY DONE TO REMOVE LEFT ANKLE HARDWARE LATER    . TONSILLECTOMY      Current Outpatient Medications  Medication Sig Dispense Refill  . acetaminophen (TYLENOL) 500 MG tablet Take 1,000 mg by mouth 2 (two) times daily.     Marland Kitchen amiodarone (PACERONE) 100 MG tablet Take 1 tablet (100 mg total) by mouth daily. 90 tablet 3  . aspirin 81 MG tablet Take 1 tablet (81 mg total) by mouth daily. 30 tablet   . atorvastatin (LIPITOR) 10 MG tablet Take 10 mg by mouth daily.    . carvedilol (COREG) 6.25 MG tablet Take 3 tablets (18.75 mg total) by mouth 2 (two) times daily with a meal.    .  gabapentin (NEURONTIN) 100 MG capsule Take 100 mg by mouth 2 (two) times daily.    . Hypromellose (GONIOVISC) 2 % SOLN Place 1 drop into both eyes daily as needed.    Marland Kitchen ipratropium-albuterol (DUONEB) 0.5-2.5 (3) MG/3ML SOLN Take 3 mLs by nebulization every 6 (six) hours as needed.    Marland Kitchen levothyroxine (SYNTHROID) 88 MCG tablet Take 1 tablet by mouth daily.    . Melatonin 5 MG TABS Take 5 mg by mouth at bedtime.    . Menthol-Methyl Salicylate (ICY HOT EXTRA STRENGTH) 10-30 % CREA Apply topically 2 (two) times daily.    . ondansetron (ZOFRAN) 4 MG tablet Take 4 mg by mouth every 6 (six) hours as needed for nausea or vomiting.    . pantoprazole (PROTONIX) 40 MG tablet Take 40 mg by mouth  daily.    . potassium chloride 20 MEQ/15ML (10%) SOLN Take 7.5 mLs (10 mEq total) by mouth daily. 450 mL 0  . Simethicone (GAS RELIEF PO) Take 1 tablet by mouth 2 (two) times daily.    Marland Kitchen tiZANidine (ZANAFLEX) 2 MG tablet Take 1 tablet (2 mg total) by mouth 4 (four) times daily -  with meals and at bedtime. 30 tablet 0  . WITCH HAZEL EX Apply topically as needed.    . Lidocaine, Anorectal, (RECTICARE) 5 % CREA Apply 1 application topically 2 (two) times daily.     . methocarbamol (ROBAXIN) 500 MG tablet Take 1 tablet by mouth as needed.    . valACYclovir (VALTREX) 1000 MG tablet Take 1,000 mg by mouth daily.     No current facility-administered medications for this visit.     Allergies:   Iohexol, Ivp dye [iodinated diagnostic agents], Scallops [shellfish allergy], Shellfish-derived products, Sulfa antibiotics, and Sulfamethoxazole   Social History:  The patient  reports that she quit smoking about 57 years ago. Her smoking use included cigarettes. She started smoking about 70 years ago. She has a 6.50 pack-year smoking history. She has never used smokeless tobacco. She reports current alcohol use of about 5.0 standard drinks of alcohol per week. She reports that she does not use drugs.   Family History:  The patient's  family history includes Atrial fibrillation in her father; Breast cancer in her maternal aunt; Dementia in her mother; Diverticulitis in her brother; Esophageal cancer in her brother; Heart attack in her mother; Heart disease in her father and mother; Stroke in her mother.   ROS:  Please see the history of present illness.   All other systems are personally reviewed and negative.    Exam:    Vital Signs:  BP (!) 146/74   Pulse 82   Ht 5' (1.524 m)   Wt 126 lb (57.2 kg)   SpO2 97%   BMI 24.61 kg/m   Well appearing today, alert, NAD, normal WOB   Labs/Other Tests and Data Reviewed:    Recent Labs: 02/11/2018: B Natriuretic Peptide 211.8 02/20/2018: Platelets 255  05/11/2018: ALT 11; Hemoglobin 9.8 05/13/2018: BUN 26; Creatinine 1.3; Potassium 3.8; Sodium 130 07/13/2018: TSH 1.46   Wt Readings from Last 3 Encounters:  08/07/18 126 lb (57.2 kg)  05/12/18 135 lb (61.2 kg)  05/08/18 132 lb (59.9 kg)     Last device remote is 08/07/2018  reviewed from [facility] PDF which reveals normal device function, no true arrhythmias    ASSESSMENT & PLAN:    1.  afib Off anticoagualtion due to prior ICH We will continue to monitor with ILR and resume anticoagulation  if she has increased AF burden. ILR today reveals no true AF.  She is on amiodarone 130m daily  Labs reviewed with her today. Continue current approach.  2. HTN Stable No change required today   Follow-up:  carelink Return to see EP team in 9 months   Patient Risk:  after full review of this patients clinical status, I feel that they are at moderate risk at this time.  Today, I have spent 15 minutes with the patient with telehealth technology discussing arrhythmia management .    SArmy Fossa MD  08/07/2018 9:51 AM     [facility] [address] [place] [phone]) [phone])

## 2018-08-08 LAB — CUP PACEART REMOTE DEVICE CHECK
Date Time Interrogation Session: 20200626203942
Implantable Pulse Generator Implant Date: 20190903

## 2018-08-13 DIAGNOSIS — B351 Tinea unguium: Secondary | ICD-10-CM | POA: Diagnosis not present

## 2018-08-13 NOTE — Progress Notes (Signed)
Carelink Summary Report / Loop Recorder 

## 2018-09-09 ENCOUNTER — Ambulatory Visit (INDEPENDENT_AMBULATORY_CARE_PROVIDER_SITE_OTHER): Payer: Medicare Other | Admitting: *Deleted

## 2018-09-09 DIAGNOSIS — I5032 Chronic diastolic (congestive) heart failure: Secondary | ICD-10-CM

## 2018-09-10 LAB — CUP PACEART REMOTE DEVICE CHECK
Date Time Interrogation Session: 20200729221001
Implantable Pulse Generator Implant Date: 20190903

## 2018-09-16 ENCOUNTER — Non-Acute Institutional Stay: Payer: Medicare Other | Admitting: Internal Medicine

## 2018-09-16 ENCOUNTER — Other Ambulatory Visit: Payer: Self-pay

## 2018-09-16 ENCOUNTER — Encounter: Payer: Self-pay | Admitting: Internal Medicine

## 2018-09-16 ENCOUNTER — Ambulatory Visit: Payer: Medicare Other | Admitting: Adult Health

## 2018-09-16 VITALS — BP 118/68 | HR 61 | Temp 97.9°F | Ht 60.0 in | Wt 125.0 lb

## 2018-09-16 DIAGNOSIS — E039 Hypothyroidism, unspecified: Secondary | ICD-10-CM | POA: Diagnosis not present

## 2018-09-16 DIAGNOSIS — K52839 Microscopic colitis, unspecified: Secondary | ICD-10-CM | POA: Diagnosis not present

## 2018-09-16 DIAGNOSIS — N183 Chronic kidney disease, stage 3 unspecified: Secondary | ICD-10-CM

## 2018-09-16 DIAGNOSIS — I10 Essential (primary) hypertension: Secondary | ICD-10-CM | POA: Diagnosis not present

## 2018-09-16 DIAGNOSIS — I69151 Hemiplegia and hemiparesis following nontraumatic intracerebral hemorrhage affecting right dominant side: Secondary | ICD-10-CM | POA: Diagnosis not present

## 2018-09-16 DIAGNOSIS — I5032 Chronic diastolic (congestive) heart failure: Secondary | ICD-10-CM | POA: Diagnosis not present

## 2018-09-16 DIAGNOSIS — I480 Paroxysmal atrial fibrillation: Secondary | ICD-10-CM

## 2018-09-16 DIAGNOSIS — I639 Cerebral infarction, unspecified: Secondary | ICD-10-CM

## 2018-09-16 DIAGNOSIS — R4701 Aphasia: Secondary | ICD-10-CM

## 2018-09-16 NOTE — Progress Notes (Signed)
Location:  Occupational psychologist of Service:  Clinic (12)  Provider: Rilea Arutyunyan L. Mariea Clonts, D.O., C.M.D.  Code Status: DNR, MOST Goals of Care:  Advanced Directives 05/12/2018  Does Patient Have a Medical Advance Directive? Yes  Type of Paramedic of [place];Living will;Out of facility DNR (pink MOST or yellow form)  Does patient want to make changes to medical advance directive? No - Patient declined  Copy of [place] in Chart? Yes - validated most recent copy scanned in chart (See row information)  Would patient like information on creating a medical advance directive? -  Pre-existing out of facility DNR order (yellow form or pink MOST form) Pink MOST form placed in chart (order not valid for inpatient use)     Chief Complaint  Patient presents with  . Medical Management of Chronic Issues    70mh follow-up    HPI: Patient is a 85y.o. female seen today for medical management of chronic diseases.    Appetite is fine. No pain.   During covid she's gotten for facetime with her two sons about an hour total per week.  JClair Gullingis here with her today for the appt and is eager to have more face-to-face visits with his mother.  Bowels move too well, she says.  No accidents.  Happen in evening.  Sounds like 7/30 was the only episode based on nursing notes which I reviewed.    Not short of breath.  Just walking all the way over here which [place]    Gets treatments for her right shoulder.  Chronic pain's always there a little bit.    If sleeps straight through, feels like she's rested.  Can usually go back to sleep if wakes up in the middle of the night.  She has a lot of difficulty explaining her sleep and bowel control situations and gets frustrated with her aphasia.    Past Medical History:  Diagnosis Date  . Abdominal mass   . AKI ([place] kidney injury) ([place] 09/15/2014  . At[place]  fibrillation ([place]   . Colitis, ulcerative ([place]   . Cough    WHITE SPUTUM FOR 2 WEEKS, NO FEVER  . Degenerative joint disease   . Diverticulosis   . DVT (deep venous thrombosis) ([place] 2009   BOTH LUNGS  . GERD (gastroesophageal reflux disease)   . Hyperlipidemia   . Hyperplastic colon polyp   . Hypertension   . Hypothyroidism   . Iron deficiency anemia    ON IRON AT TIMES  . PE (pulmonary embolism) 2009  . Persistent atrial fibrillation   . Positive H. pylori test 01/20/96  . S/P dilatation of esophageal stricture 1992  . Skin cancer    h/o basal and squamous skin cancer--> removed  . Stroke ([facility]     Past Surgical History:  Procedure Laterality Date  . APPENDECTOMY    . BREAST EXCISIONAL BIOPSY Left 1992   benign  . BREAST LUMPECTOMY     benign  . DILATION AND CURETTAGE OF UTERUS     for endometrial polyps  . ENDOSCOPIC RETROGRADE CHOLANGIOPANCREATOGRAPHY (ERCP) WITH PROPOFOL N/A 03/23/2015   Procedure: ENDOSCOPIC RETROGRADE CHOLANGIOPANCREATOGRAPHY (ERCP) WITH PROPOFOL;  Surgeon: DMilus Banister MD;  [facility];  Service: Endoscopy;  Laterality: N/A;  . JOINT REPLACEMENT    . LOOP RECORDER INSERTION N/A 10/14/2017   Procedure: LOOP RECORDER INSERTION;  Surgeon: AThompson Grayer MD;  Location: [place] [facility];  Service: Cardiovascular;  Laterality: N/A;  . ORIF ANKLE FRACTURE Left    X 2  . REPLACEMENT TOTAL KNEE     left  . SURGERY DONE TO REMOVE LEFT ANKLE HARDWARE LATER    . TONSILLECTOMY      Allergies  Allergen Reactions  . Iohexol Hives  . Ivp Dye [Iodinated Diagnostic Agents] Hives    Was able to tolerate Contrast Media for CT scan.  [name] [Shellfish Allergy] Nausea And Vomiting    Projectile vomiting  . Shellfish-Derived Products Nausea And Vomiting  . Sulfa Antibiotics Hives  . Sulfamethoxazole Hives    Outpatient Encounter Medications as of 09/16/2018  Medication Sig  . acetaminophen (TYLENOL) 500 MG tablet Take 1,000 mg by mouth 2  (two) times daily.   Marland Kitchen amiodarone (PACERONE) 100 MG tablet Take 1 tablet (100 mg total) by mouth daily.  Marland Kitchen aspirin 81 MG tablet Take 1 tablet (81 mg total) by mouth daily.  Marland Kitchen atorvastatin (LIPITOR) 10 MG tablet Take 10 mg by mouth daily.  . Ca Phosphate-Cholecalciferol (CALTRATE GUMMY BITES PO) Take 1 tablet by mouth 2 (two) times daily.  . carvedilol (COREG) 6.25 MG tablet Take 3 tablets (18.75 mg total) by mouth 2 (two) times daily with a meal.  . docusate sodium (COLACE) 100 MG capsule Take 100 mg by mouth every other day.  . Hypromellose (GONIOVISC) 2 % SOLN Place 1 drop into both eyes daily as needed.  Marland Kitchen ipratropium-albuterol (DUONEB) 0.5-2.5 (3) MG/3ML SOLN Take 3 mLs by nebulization every 6 (six) hours as needed.  Marland Kitchen levothyroxine (SYNTHROID) 88 MCG tablet Take 1 tablet by mouth daily.  . Melatonin 5 MG TABS Take 5 mg by mouth at bedtime.  . Menthol-Methyl Salicylate (ICY HOT EXTRA STRENGTH) 10-30 % CREA Apply topically 2 (two) times daily.  . ondansetron (ZOFRAN) 4 MG tablet Take 4 mg by mouth every 6 (six) hours as needed for nausea or vomiting.  . pantoprazole (PROTONIX) 40 MG tablet Take 40 mg by mouth daily.  . potassium chloride 20 MEQ/15ML (10%) SOLN Take 7.5 mLs (10 mEq total) by mouth daily.  . Simethicone (GAS RELIEF PO) Take 1 tablet by mouth 2 (two) times daily.  . sodium chloride 1 g tablet Take 1 g by mouth 2 (two) times daily with a meal.  . tiZANidine (ZANAFLEX) 2 MG tablet Take 1 tablet (2 mg total) by mouth 4 (four) times daily -  with meals and at bedtime.  . WITCH HAZEL EX Apply topically as needed.  . [DISCONTINUED] gabapentin (NEURONTIN) 100 MG capsule Take 100 mg by mouth 2 (two) times daily.  . [DISCONTINUED] Lidocaine, Anorectal, (RECTICARE) 5 % CREA Apply 1 application topically 2 (two) times daily.   . [DISCONTINUED] methocarbamol (ROBAXIN) 500 MG tablet Take 1 tablet by mouth as needed.  . [DISCONTINUED] valACYclovir (VALTREX) 1000 MG tablet Take 1,000 mg by  mouth daily.   No facility-administered encounter medications on file as of 09/16/2018.     Review of Systems:  Review of Systems  Constitutional: Negative for chills, fever and malaise/fatigue.  HENT: Negative for congestion.   Eyes: Negative for blurred vision.  Respiratory: Negative for cough and shortness of breath.   Cardiovascular: Negative for chest pain, palpitations and leg swelling.  Gastrointestinal: Negative for abdominal pain, blood in stool, constipation, melena, nausea and vomiting.       Some diarrhea, but occasional not consistent like it was with her colitis spells  Genitourinary: Negative  for dysuria.       Some incontinence  Musculoskeletal: Negative for falls and joint pain.       Ambulates with walker  Skin: Negative for itching and rash.  Neurological: Negative for dizziness and loss of consciousness.  Endo/Heme/Allergies: Bruises/bleeds easily.  Psychiatric/Behavioral: Positive for memory loss. Negative for depression. The patient is not nervous/anxious and does not have insomnia.     Health Maintenance  Topic Date Due  . TETANUS/TDAP  04/14/2018  . INFLUENZA VACCINE  09/12/2018  . DEXA SCAN  Completed  . PNA vac Low Risk Adult  Completed    Physical Exam: Vitals:   09/16/18 0908  BP: 118/68  Pulse: 61  Temp: 97.9 F (36.6 C)  TempSrc: Oral  SpO2: 94%  Weight: 125 lb (56.7 kg)  Height: 5' (1.524 m)   Body mass index is 24.41 kg/m. Physical Exam Vitals signs and nursing note reviewed.  Constitutional:      General: She is not in acute distress.    Appearance: Normal appearance. She is normal weight. She is not ill-appearing or toxic-appearing.  HENT:     Head: Normocephalic and atraumatic.     Right Ear: External ear normal.     Left Ear: External ear normal.  Eyes:     Conjunctiva/sclera: Conjunctivae normal.     Pupils: Pupils are equal, round, and reactive to light.  Cardiovascular:     Rate and Rhythm: Rhythm irregular.     Heart  sounds: No murmur.  Pulmonary:     Effort: Pulmonary effort is normal.     Breath sounds: Normal breath sounds. No wheezing, rhonchi or rales.  Abdominal:     General: Bowel sounds are normal.     Palpations: Abdomen is soft.     Tenderness: There is no abdominal tenderness.  Musculoskeletal: Normal range of motion.     Right lower leg: No edema.     Left lower leg: No edema.  Skin:    General: Skin is warm and dry.     Capillary Refill: Capillary refill takes less than 2 seconds.  Neurological:     Mental Status: She is alert. Mental status is at baseline.     Gait: Gait abnormal.     Comments: Left foot turns out; expressive aphasia severe  Psychiatric:        Mood and Affect: Mood normal.     Comments: Pleasant, smiles     Labs reviewed: Basic Metabolic Panel: Recent Labs    02/11/18 1548  02/20/18 0700 05/11/18 0100 05/13/18 05/18/18 0700 07/13/18 0700  NA 132*   < > 131* 118* 130*  --   --   K 4.4   < > 4.2 4.3 3.8  --   --   CL 102  --   --   --   --   --   --   CO2 20*  --   --   --   --   --   --   GLUCOSE 174*  --   --   --   --   --   --   BUN 19   < > 21 34* 26*  --   --   CREATININE 1.50*   < > 1.5* 1.4* 1.3*  --   --   CALCIUM 8.0*  --   --   --   --   --   --   TSH  --   --   --   --   --  5.16 1.46   < > = values in this interval not displayed.   Liver Function Tests: Recent Labs    02/20/18 0700 02/23/18 0300 05/11/18 0100  AST 35 40* 15  ALT 28 34 11  ALKPHOS 233* 385* 96   No results for input(s): LIPASE, AMYLASE in the last 8760 hours. No results for input(s): AMMONIA in the last 8760 hours. CBC: Recent Labs    02/11/18 1548 02/18/18 02/20/18 0700 05/11/18 0100  WBC 11.4* 27.2 7.3 8.7  NEUTROABS  --  26  --   --   HGB 11.2* 10.5* 11.6* 9.8*  HCT 35.1* 31* 34* 28*  MCV 88.0  --   --   --   PLT 244 279 255  --    Lipid Panel: No results for input(s): CHOL, HDL, LDLCALC, TRIG, CHOLHDL, LDLDIRECT in the last 8760 hours. Lab Results   Component Value Date   HGBA1C 5.3 08/02/2013    Assessment/Plan 1. Expressive aphasia -ongoing, no longer doing ST as plateaued with this -remains frustrating to her, but spirits seem good nonetheless  2. Hemiparesis of right dominant side as late effect of nontraumatic intracerebral hemorrhage (HCC) -ongoing, but able to ambulate with walker quite well and no recent falls  3. Paroxysmal atrial fibrillation -remains on amiodarone, baby asa and rate controlled with coreg -other anticoagulation was discontinued due to prior intracranial hemorrhage and comfort-based goals -she did not have afib at last cardiology appt with Dr. [name] either so no plans in place for NOAC  4. Chronic diastolic congestive heart failure (HCC) -no signs of acute exacerbation, cont same mgt  5. Essential hypertension -bps controlled w/o dizziness symptoms or falls so cont same regimen  6. Hypothyroidism, unspecified type -clinically and chemically euthyroid, cont same dose levothyroxine Lab Results  Component Value Date   TSH 1.46 07/13/2018    7. Stage 3 chronic kidney disease (HCC) -Avoid nephrotoxic agents like nsaids, dose adjust renally excreted meds, hydrate.  8. Microscopic colitis, unspecified microscopic colitis type -has had bouts of this that resolve with steroids; no consistent diarrhea concerns at this time, monitor and treat early if recurs to prevent dehydration and renal failure  Labs/tests ordered:  No new we decided as she is doing fine, does not like getting stuck and goals of care are comfort-based  Next appt:  4 mos; 01/20/2019  Shafer Swamy L. Jaala Bohle, D.O. [facility] [address], [place] [address] Cell Phone (Mon-Fri 8am-5pm):  [phone] On Call:  [phone] & follow prompts after 5pm & weekends Office Phone:  [phone] Office Fax:  [phone]

## 2018-09-18 DIAGNOSIS — K52839 Microscopic colitis, unspecified: Secondary | ICD-10-CM | POA: Insufficient documentation

## 2018-09-21 NOTE — Progress Notes (Signed)
Carelink Summary Report / Loop Recorder 

## 2018-10-12 ENCOUNTER — Ambulatory Visit (INDEPENDENT_AMBULATORY_CARE_PROVIDER_SITE_OTHER): Payer: Medicare Other | Admitting: *Deleted

## 2018-10-12 DIAGNOSIS — I639 Cerebral infarction, unspecified: Secondary | ICD-10-CM | POA: Diagnosis not present

## 2018-10-13 LAB — CUP PACEART REMOTE DEVICE CHECK
Date Time Interrogation Session: 20200831224005
Implantable Pulse Generator Implant Date: 20190903

## 2018-10-15 DIAGNOSIS — I69151 Hemiplegia and hemiparesis following nontraumatic intracerebral hemorrhage affecting right dominant side: Secondary | ICD-10-CM | POA: Diagnosis not present

## 2018-10-15 DIAGNOSIS — R2689 Other abnormalities of gait and mobility: Secondary | ICD-10-CM | POA: Diagnosis not present

## 2018-10-15 DIAGNOSIS — I610 Nontraumatic intracerebral hemorrhage in hemisphere, subcortical: Secondary | ICD-10-CM | POA: Diagnosis not present

## 2018-10-19 DIAGNOSIS — R2689 Other abnormalities of gait and mobility: Secondary | ICD-10-CM | POA: Diagnosis not present

## 2018-10-19 DIAGNOSIS — I69151 Hemiplegia and hemiparesis following nontraumatic intracerebral hemorrhage affecting right dominant side: Secondary | ICD-10-CM | POA: Diagnosis not present

## 2018-10-19 DIAGNOSIS — I610 Nontraumatic intracerebral hemorrhage in hemisphere, subcortical: Secondary | ICD-10-CM | POA: Diagnosis not present

## 2018-10-21 NOTE — Progress Notes (Signed)
Carelink Summary Report / Loop Recorder 

## 2018-11-12 DIAGNOSIS — Z9189 Other specified personal risk factors, not elsewhere classified: Secondary | ICD-10-CM | POA: Diagnosis not present

## 2018-11-15 LAB — CUP PACEART REMOTE DEVICE CHECK
Date Time Interrogation Session: 20201003224327
Implantable Pulse Generator Implant Date: 20190903

## 2018-11-16 ENCOUNTER — Ambulatory Visit (INDEPENDENT_AMBULATORY_CARE_PROVIDER_SITE_OTHER): Payer: Medicare Other | Admitting: *Deleted

## 2018-11-16 DIAGNOSIS — I5032 Chronic diastolic (congestive) heart failure: Secondary | ICD-10-CM

## 2018-11-19 DIAGNOSIS — Z20828 Contact with and (suspected) exposure to other viral communicable diseases: Secondary | ICD-10-CM | POA: Diagnosis not present

## 2018-11-20 LAB — NOVEL CORONAVIRUS, NAA: SARS-CoV-2, NAA: NOT DETECTED

## 2018-11-25 DIAGNOSIS — Z9189 Other specified personal risk factors, not elsewhere classified: Secondary | ICD-10-CM | POA: Diagnosis not present

## 2018-11-25 NOTE — Progress Notes (Signed)
Carelink Summary Report / Loop Recorder 

## 2018-12-01 DIAGNOSIS — Z9189 Other specified personal risk factors, not elsewhere classified: Secondary | ICD-10-CM | POA: Diagnosis not present

## 2018-12-02 ENCOUNTER — Encounter: Payer: Self-pay | Admitting: Internal Medicine

## 2018-12-08 DIAGNOSIS — Z20828 Contact with and (suspected) exposure to other viral communicable diseases: Secondary | ICD-10-CM | POA: Diagnosis not present

## 2018-12-08 DIAGNOSIS — Z9189 Other specified personal risk factors, not elsewhere classified: Secondary | ICD-10-CM | POA: Diagnosis not present

## 2018-12-15 DIAGNOSIS — Z9189 Other specified personal risk factors, not elsewhere classified: Secondary | ICD-10-CM | POA: Diagnosis not present

## 2018-12-17 ENCOUNTER — Ambulatory Visit (INDEPENDENT_AMBULATORY_CARE_PROVIDER_SITE_OTHER): Payer: Medicare Other | Admitting: *Deleted

## 2018-12-17 DIAGNOSIS — I480 Paroxysmal atrial fibrillation: Secondary | ICD-10-CM

## 2018-12-17 DIAGNOSIS — I5032 Chronic diastolic (congestive) heart failure: Secondary | ICD-10-CM | POA: Diagnosis not present

## 2018-12-17 LAB — CUP PACEART REMOTE DEVICE CHECK
Date Time Interrogation Session: 20201105205504
Implantable Pulse Generator Implant Date: 20190903

## 2018-12-23 DIAGNOSIS — Z9189 Other specified personal risk factors, not elsewhere classified: Secondary | ICD-10-CM | POA: Diagnosis not present

## 2018-12-29 DIAGNOSIS — Z9189 Other specified personal risk factors, not elsewhere classified: Secondary | ICD-10-CM | POA: Diagnosis not present

## 2018-12-31 NOTE — Progress Notes (Signed)
Carelink Summary Report / Loop Recorder 

## 2019-01-06 DIAGNOSIS — Z20828 Contact with and (suspected) exposure to other viral communicable diseases: Secondary | ICD-10-CM | POA: Diagnosis not present

## 2019-01-06 DIAGNOSIS — Z9189 Other specified personal risk factors, not elsewhere classified: Secondary | ICD-10-CM | POA: Diagnosis not present

## 2019-01-19 ENCOUNTER — Ambulatory Visit (INDEPENDENT_AMBULATORY_CARE_PROVIDER_SITE_OTHER): Payer: Medicare Other | Admitting: *Deleted

## 2019-01-19 DIAGNOSIS — I480 Paroxysmal atrial fibrillation: Secondary | ICD-10-CM | POA: Diagnosis not present

## 2019-01-20 ENCOUNTER — Other Ambulatory Visit: Payer: Self-pay

## 2019-01-20 ENCOUNTER — Non-Acute Institutional Stay: Payer: Medicare Other | Admitting: Internal Medicine

## 2019-01-20 ENCOUNTER — Encounter: Payer: Self-pay | Admitting: Internal Medicine

## 2019-01-20 VITALS — BP 110/60 | HR 56 | Temp 98.2°F | Ht 60.0 in | Wt 122.8 lb

## 2019-01-20 DIAGNOSIS — I639 Cerebral infarction, unspecified: Secondary | ICD-10-CM

## 2019-01-20 DIAGNOSIS — R4701 Aphasia: Secondary | ICD-10-CM | POA: Diagnosis not present

## 2019-01-20 DIAGNOSIS — I952 Hypotension due to drugs: Secondary | ICD-10-CM | POA: Diagnosis not present

## 2019-01-20 DIAGNOSIS — I480 Paroxysmal atrial fibrillation: Secondary | ICD-10-CM | POA: Diagnosis not present

## 2019-01-20 DIAGNOSIS — K52839 Microscopic colitis, unspecified: Secondary | ICD-10-CM | POA: Diagnosis not present

## 2019-01-20 DIAGNOSIS — I69151 Hemiplegia and hemiparesis following nontraumatic intracerebral hemorrhage affecting right dominant side: Secondary | ICD-10-CM | POA: Diagnosis not present

## 2019-01-20 DIAGNOSIS — Z66 Do not resuscitate: Secondary | ICD-10-CM

## 2019-01-20 LAB — CUP PACEART REMOTE DEVICE CHECK
Date Time Interrogation Session: 20201208190952
Implantable Pulse Generator Implant Date: 20190903

## 2019-01-20 NOTE — Progress Notes (Addendum)
Location:  Occupational psychologist of Service:  Clinic (12)  Provider: Menna Abeln L. Mariea Clonts, D.O., C.M.D.  Code Status: DNR Goals of Care:  Advanced Directives 05/12/2018  Does Patient Have a Medical Advance Directive? Yes  Type of Paramedic of [place];Living will;Out of facility DNR (pink MOST or yellow form)  Does patient want to make changes to medical advance directive? No - Patient declined  Copy of [place] in Chart? Yes - validated most recent copy scanned in chart (See row information)  Would patient like information on creating a medical advance directive? -  Pre-existing out of facility DNR order (yellow form or pink MOST form) Pink MOST form placed in chart (order not valid for inpatient use)   Chief Complaint  Patient presents with  . Medical Management of Chronic Issues    4 Month Follow up    HPI: Patient is a 85 y.o. female seen today for medical management of chronic diseases.    Sara Russell was seen today, but in isolation due to potential exposure by a caregiver.  She is doing fine.  She has no complaints herself--sitting in her recliner with her feet up watching tv.  We included Sara Russell, her son, in the visit via [place].  He was at work.  I spoke with the nurse manager just before the visit and her one concern was about Sara Russell still running low bps in the morning.  Staff have been giving her fluids and having her relax in her recliner with her feet up.  This resolves the low blood pressures (under 588 systolic).  Previously, we did reduce her coreg.  This may need to be done again.  I'm going to send a message to Dr. [name] to be sure that's acceptable.  She is also on amiodarone for her afib.    Sara Russell is moving her bowels ok.  No mention of hemorrhoid problems lately.  Sleeping well.  Did not have urinary complaints today.  She is in good spirits and smiles and giggles at most things--not able to find words so gets  frustrated at times.  Still has some intermittent discomfort in the right shoulder--has prn zanaflex and also icy hot for this.  Past Medical History:  Diagnosis Date  . Abdominal mass   . AKI (acute kidney injury) ([place]) 09/15/2014  . Atrial fibrillation ([place])   . Colitis, ulcerative ([place])   . Cough    WHITE SPUTUM FOR 2 WEEKS, NO FEVER  . Degenerative joint disease   . Diverticulosis   . DVT (deep venous thrombosis) ([place]) 2009   BOTH LUNGS  . GERD (gastroesophageal reflux disease)   . Hyperlipidemia   . Hyperplastic colon polyp   . Hypertension   . Hypothyroidism   . Iron deficiency anemia    ON IRON AT TIMES  . PE (pulmonary embolism) 2009  . Persistent atrial fibrillation ([place])   . Positive H. pylori test 01/20/96  . S/P dilatation of esophageal stricture 1992  . Skin cancer    h/o basal and squamous skin cancer--> removed  . Stroke [facility])     Past Surgical History:  Procedure Laterality Date  . APPENDECTOMY    . BREAST EXCISIONAL BIOPSY Left 1992   benign  . BREAST LUMPECTOMY     benign  . DILATION AND CURETTAGE OF UTERUS     for endometrial polyps  . ENDOSCOPIC RETROGRADE CHOLANGIOPANCREATOGRAPHY (ERCP) WITH PROPOFOL N/A 03/23/2015   Procedure: ENDOSCOPIC RETROGRADE CHOLANGIOPANCREATOGRAPHY (ERCP)  WITH PROPOFOL;  Surgeon: Milus Banister, MD;  [facility];  Service: Endoscopy;  Laterality: N/A;  . JOINT REPLACEMENT    . LOOP RECORDER INSERTION N/A 10/14/2017   Procedure: LOOP RECORDER INSERTION;  Surgeon: Thompson Grayer, MD;  Location: [place] [facility];  Service: Cardiovascular;  Laterality: N/A;  . ORIF ANKLE FRACTURE Left    X 2  . REPLACEMENT TOTAL KNEE     left  . SURGERY DONE TO REMOVE LEFT ANKLE HARDWARE LATER    . TONSILLECTOMY      Allergies  Allergen Reactions  . Iohexol Hives  . Ivp Dye [Iodinated Diagnostic Agents] Hives    Was able to tolerate Contrast Media for CT scan.  Sara Russell [Shellfish Allergy] Nausea And Vomiting    Projectile  vomiting  . Shellfish-Derived Products Nausea And Vomiting  . Sulfa Antibiotics Hives  . Sulfamethoxazole Hives    Outpatient Encounter Medications as of 01/20/2019  Medication Sig  . acetaminophen (TYLENOL) 500 MG tablet Take 1,000 mg by mouth 2 (two) times daily.   Marland Kitchen amiodarone (PACERONE) 100 MG tablet Take 1 tablet (100 mg total) by mouth daily.  Marland Kitchen aspirin 81 MG tablet Take 1 tablet (81 mg total) by mouth daily.  Marland Kitchen atorvastatin (LIPITOR) 10 MG tablet Take 10 mg by mouth daily.  . Ca Phosphate-Cholecalciferol (CALTRATE GUMMY BITES PO) Take 1 tablet by mouth 2 (two) times daily.  . carvedilol (COREG) 6.25 MG tablet Take 12.5 mg by mouth 2 (two) times daily with a meal.  . docusate sodium (COLACE) 100 MG capsule Take 100 mg by mouth every other day.  . Hypromellose (GONIOVISC) 2 % SOLN Place 1 drop into both eyes daily as needed.  Marland Kitchen ipratropium-albuterol (DUONEB) 0.5-2.5 (3) MG/3ML SOLN Take 3 mLs by nebulization every 6 (six) hours as needed.  Marland Kitchen levothyroxine (SYNTHROID) 88 MCG tablet Take 1 tablet by mouth daily.  . Melatonin 5 MG TABS Take 5 mg by mouth at bedtime.  . Menthol-Methyl Salicylate (ICY HOT EXTRA STRENGTH) 10-30 % CREA Apply topically 2 (two) times daily.  . ondansetron (ZOFRAN) 4 MG tablet Take 4 mg by mouth every 6 (six) hours as needed for nausea or vomiting.  . pantoprazole (PROTONIX) 40 MG tablet Take 40 mg by mouth daily.  . polyethylene glycol (MIRALAX / GLYCOLAX) 17 g packet Take 17 g by mouth daily as needed. Hold if loose stools occur  . potassium chloride 20 MEQ/15ML (10%) SOLN Take 7.5 mLs (10 mEq total) by mouth daily.  . Simethicone (GAS RELIEF PO) Take 1 tablet by mouth 2 (two) times daily.  . sodium chloride 1 g tablet Take 1 g by mouth 2 (two) times daily with a meal.  . tiZANidine (ZANAFLEX) 2 MG tablet Take 1 tablet (2 mg total) by mouth 4 (four) times daily -  with meals and at bedtime.  . WITCH HAZEL EX Apply topically as needed.  . [DISCONTINUED]  carvedilol (COREG) 6.25 MG tablet Take 3 tablets (18.75 mg total) by mouth 2 (two) times daily with a meal. (Patient taking differently: Take 12.5 mg by mouth 2 (two) times daily with a meal. )   No facility-administered encounter medications on file as of 01/20/2019.    Review of Systems:  Review of Systems  Constitutional: Negative for chills, fever and malaise/fatigue.  HENT: Negative for congestion and sore throat.   Eyes: Negative for blurred vision.  Respiratory: Negative for cough, shortness of breath and wheezing.   Cardiovascular: Negative for chest  pain, palpitations and leg swelling.  Gastrointestinal: Negative for blood in stool and constipation.  Genitourinary: Negative for dysuria.  Musculoskeletal: Positive for joint pain. Negative for falls.  Skin: Negative for itching and rash.  Neurological: Positive for speech change. Negative for dizziness, tingling and loss of consciousness.  Psychiatric/Behavioral: Positive for memory loss. Negative for depression. The patient is not nervous/anxious and does not have insomnia.        Aphasia    Health Maintenance  Topic Date Due  . TETANUS/TDAP  04/14/2018  . INFLUENZA VACCINE  Completed  . DEXA SCAN  Completed  . PNA vac Low Risk Adult  Completed    Physical Exam: Vitals:   01/20/19 1609  BP: 110/60  Pulse: (!) 56  Temp: 98.2 F (36.8 C)  TempSrc: Oral  SpO2: 97%  Weight: 122 lb 12.8 oz (55.7 kg)  Height: 5' (1.524 m)   Body mass index is 23.98 kg/m. Physical Exam Constitutional:      General: She is not in acute distress.    Appearance: Normal appearance. She is not toxic-appearing.  HENT:     Head: Normocephalic and atraumatic.     Right Ear: External ear normal.     Left Ear: External ear normal.  Cardiovascular:     Rate and Rhythm: Rhythm irregular.     Heart sounds: No murmur.  Pulmonary:     Effort: Pulmonary effort is normal.     Breath sounds: Normal breath sounds. No wheezing.  Abdominal:      General: Bowel sounds are normal. There is no distension.     Palpations: Abdomen is soft.     Tenderness: There is no abdominal tenderness. There is no guarding or rebound.  Musculoskeletal:        General: Tenderness present.     Right lower leg: No edema.     Left lower leg: No edema.     Comments: Right shoulder sore  Neurological:     General: No focal deficit present.     Mental Status: She is alert and oriented to person, place, and time.     Comments: But cannot find words and does get words mixed up at times; right hemiparesis  Psychiatric:        Mood and Affect: Mood normal.        Behavior: Behavior normal.     Labs reviewed: Basic Metabolic Panel: Recent Labs    02/11/18 1548 02/20/18 0700 05/11/18 0100 05/13/18 0000 05/18/18 0700 07/13/18 0700  NA 132* 131* 118* 130*  --   --   K 4.4 4.2 4.3 3.8  --   --   CL 102  --   --   --   --   --   CO2 20*  --   --   --   --   --   GLUCOSE 174*  --   --   --   --   --   BUN 19 21 34* 26*  --   --   CREATININE 1.50* 1.5* 1.4* 1.3*  --   --   CALCIUM 8.0*  --   --   --   --   --   TSH  --   --   --   --  5.16 1.46   Liver Function Tests: Recent Labs    02/20/18 0700 02/23/18 0300 05/11/18 0100  AST 35 40* 15  ALT 28 34 11  ALKPHOS 233* 385* 96   No results  for input(s): LIPASE, AMYLASE in the last 8760 hours. No results for input(s): AMMONIA in the last 8760 hours. CBC: Recent Labs    02/11/18 1548 02/18/18 0000 02/20/18 0700 05/11/18 0100  WBC 11.4* 27.2 7.3 8.7  NEUTROABS  --  26  --   --   HGB 11.2* 10.5* 11.6* 9.8*  HCT 35.1* 31* 34* 28*  MCV 88.0  --   --   --   PLT 244 279 255  --    Lipid Panel: No results for input(s): CHOL, HDL, LDLCALC, TRIG, CHOLHDL, LDLDIRECT in the last 8760 hours. Lab Results  Component Value Date   HGBA1C 5.3 08/02/2013    Procedures since last visit: CUP [place]  Result Date: 01/20/2019 Carelink summary report received. Battery status OK.  Normal device function. No new symptom episodes, tachy episodes, brady, or pause episodes. No new AF episodes. Monthly summary reports and ROV/PRN; Limited reort review available.   Assessment/Plan 1. Hypotension due to drugs -would favor reducing coreg to 6.65m po bid if she's be able to maintain a reasonable HR with her afib at this level -continue her amiodarone  2. Hemiparesis of right dominant side as late effect of nontraumatic intracerebral hemorrhage (HCC) -with some chronic right shoulder pain--cont prn tizanidine and bid icy hot--sounds like it is not always applied  3. Expressive aphasia -continues--seemed a bit better today to me  4. Paroxysmal atrial fibrillation (HCC) -on coreg and amiodarone and baby asa, had considerable bleeding with NOAC which had to be stopped  5. Microscopic colitis, unspecified microscopic colitis type -doing ok recently--on constipation regimen as needed at this point; has responded to entocort historically if diarrhea returns  6. DNR (do not resuscitate) - DNR (Do Not Resuscitate) order renewed  Labs/tests ordered:  No new Next appt:  3-4 mos  Kelsie Zaborowski L. Kleber Crean, D.O. [facility] [address] [place] [id number] Phone (Mon-Fri 8am-5pm):  [phone] Call:  [phone]& follow prompts after 5pm & weekends Office Phone:  [phone] Fax:  [phone]

## 2019-02-20 DIAGNOSIS — M25571 Pain in right ankle and joints of right foot: Secondary | ICD-10-CM | POA: Diagnosis not present

## 2019-02-20 DIAGNOSIS — M25561 Pain in right knee: Secondary | ICD-10-CM | POA: Diagnosis not present

## 2019-02-22 ENCOUNTER — Ambulatory Visit (INDEPENDENT_AMBULATORY_CARE_PROVIDER_SITE_OTHER): Payer: Medicare Other | Admitting: *Deleted

## 2019-02-22 DIAGNOSIS — I480 Paroxysmal atrial fibrillation: Secondary | ICD-10-CM | POA: Diagnosis not present

## 2019-02-22 DIAGNOSIS — Z9189 Other specified personal risk factors, not elsewhere classified: Secondary | ICD-10-CM | POA: Diagnosis not present

## 2019-02-22 DIAGNOSIS — Z20828 Contact with and (suspected) exposure to other viral communicable diseases: Secondary | ICD-10-CM | POA: Diagnosis not present

## 2019-02-22 LAB — CUP PACEART REMOTE DEVICE CHECK
Date Time Interrogation Session: 20210111032605
Implantable Pulse Generator Implant Date: 20190903

## 2019-02-23 DIAGNOSIS — Z23 Encounter for immunization: Secondary | ICD-10-CM | POA: Diagnosis not present

## 2019-02-25 DIAGNOSIS — Z9181 History of falling: Secondary | ICD-10-CM | POA: Diagnosis not present

## 2019-02-25 DIAGNOSIS — R278 Other lack of coordination: Secondary | ICD-10-CM | POA: Diagnosis not present

## 2019-02-25 DIAGNOSIS — F0151 Vascular dementia with behavioral disturbance: Secondary | ICD-10-CM | POA: Diagnosis not present

## 2019-02-25 DIAGNOSIS — I69151 Hemiplegia and hemiparesis following nontraumatic intracerebral hemorrhage affecting right dominant side: Secondary | ICD-10-CM | POA: Diagnosis not present

## 2019-02-25 DIAGNOSIS — R2689 Other abnormalities of gait and mobility: Secondary | ICD-10-CM | POA: Diagnosis not present

## 2019-02-25 DIAGNOSIS — S93491S Sprain of other ligament of right ankle, sequela: Secondary | ICD-10-CM | POA: Diagnosis not present

## 2019-02-26 DIAGNOSIS — F0151 Vascular dementia with behavioral disturbance: Secondary | ICD-10-CM | POA: Diagnosis not present

## 2019-02-26 DIAGNOSIS — Z9181 History of falling: Secondary | ICD-10-CM | POA: Diagnosis not present

## 2019-02-26 DIAGNOSIS — I69151 Hemiplegia and hemiparesis following nontraumatic intracerebral hemorrhage affecting right dominant side: Secondary | ICD-10-CM | POA: Diagnosis not present

## 2019-02-26 DIAGNOSIS — R2689 Other abnormalities of gait and mobility: Secondary | ICD-10-CM | POA: Diagnosis not present

## 2019-02-26 DIAGNOSIS — S93491S Sprain of other ligament of right ankle, sequela: Secondary | ICD-10-CM | POA: Diagnosis not present

## 2019-02-26 DIAGNOSIS — R278 Other lack of coordination: Secondary | ICD-10-CM | POA: Diagnosis not present

## 2019-03-01 DIAGNOSIS — Z20828 Contact with and (suspected) exposure to other viral communicable diseases: Secondary | ICD-10-CM | POA: Diagnosis not present

## 2019-03-01 DIAGNOSIS — I69151 Hemiplegia and hemiparesis following nontraumatic intracerebral hemorrhage affecting right dominant side: Secondary | ICD-10-CM | POA: Diagnosis not present

## 2019-03-01 DIAGNOSIS — F0151 Vascular dementia with behavioral disturbance: Secondary | ICD-10-CM | POA: Diagnosis not present

## 2019-03-01 DIAGNOSIS — R2689 Other abnormalities of gait and mobility: Secondary | ICD-10-CM | POA: Diagnosis not present

## 2019-03-01 DIAGNOSIS — Z9181 History of falling: Secondary | ICD-10-CM | POA: Diagnosis not present

## 2019-03-01 DIAGNOSIS — S93491S Sprain of other ligament of right ankle, sequela: Secondary | ICD-10-CM | POA: Diagnosis not present

## 2019-03-01 DIAGNOSIS — R278 Other lack of coordination: Secondary | ICD-10-CM | POA: Diagnosis not present

## 2019-03-01 DIAGNOSIS — Z9189 Other specified personal risk factors, not elsewhere classified: Secondary | ICD-10-CM | POA: Diagnosis not present

## 2019-03-02 DIAGNOSIS — R278 Other lack of coordination: Secondary | ICD-10-CM | POA: Diagnosis not present

## 2019-03-02 DIAGNOSIS — I69151 Hemiplegia and hemiparesis following nontraumatic intracerebral hemorrhage affecting right dominant side: Secondary | ICD-10-CM | POA: Diagnosis not present

## 2019-03-02 DIAGNOSIS — Z9181 History of falling: Secondary | ICD-10-CM | POA: Diagnosis not present

## 2019-03-02 DIAGNOSIS — S93491S Sprain of other ligament of right ankle, sequela: Secondary | ICD-10-CM | POA: Diagnosis not present

## 2019-03-02 DIAGNOSIS — F0151 Vascular dementia with behavioral disturbance: Secondary | ICD-10-CM | POA: Diagnosis not present

## 2019-03-02 DIAGNOSIS — R2689 Other abnormalities of gait and mobility: Secondary | ICD-10-CM | POA: Diagnosis not present

## 2019-03-05 DIAGNOSIS — F0151 Vascular dementia with behavioral disturbance: Secondary | ICD-10-CM | POA: Diagnosis not present

## 2019-03-05 DIAGNOSIS — Z9181 History of falling: Secondary | ICD-10-CM | POA: Diagnosis not present

## 2019-03-05 DIAGNOSIS — I69151 Hemiplegia and hemiparesis following nontraumatic intracerebral hemorrhage affecting right dominant side: Secondary | ICD-10-CM | POA: Diagnosis not present

## 2019-03-05 DIAGNOSIS — R2689 Other abnormalities of gait and mobility: Secondary | ICD-10-CM | POA: Diagnosis not present

## 2019-03-05 DIAGNOSIS — S93491S Sprain of other ligament of right ankle, sequela: Secondary | ICD-10-CM | POA: Diagnosis not present

## 2019-03-05 DIAGNOSIS — R278 Other lack of coordination: Secondary | ICD-10-CM | POA: Diagnosis not present

## 2019-03-08 ENCOUNTER — Telehealth: Payer: Self-pay | Admitting: *Deleted

## 2019-03-08 DIAGNOSIS — Z9189 Other specified personal risk factors, not elsewhere classified: Secondary | ICD-10-CM | POA: Diagnosis not present

## 2019-03-08 DIAGNOSIS — Z20828 Contact with and (suspected) exposure to other viral communicable diseases: Secondary | ICD-10-CM | POA: Diagnosis not present

## 2019-03-08 NOTE — Telephone Encounter (Signed)
LINQ alert transmission received 03/06/19 for "AF" episode on 03/06/19 at 19:16, 69mn duration. ECG suggests SR w/PACs, though baseline artifact makes interpretation difficult. Per telemedicine note from 08/07/18, plan to resume anticoagulation if AF burden increases (current burden 0%). Routed to Dr. ARayann Hemanfor review.

## 2019-03-09 DIAGNOSIS — B351 Tinea unguium: Secondary | ICD-10-CM | POA: Diagnosis not present

## 2019-03-09 DIAGNOSIS — M79671 Pain in right foot: Secondary | ICD-10-CM | POA: Diagnosis not present

## 2019-03-09 DIAGNOSIS — M79672 Pain in left foot: Secondary | ICD-10-CM | POA: Diagnosis not present

## 2019-03-12 NOTE — Telephone Encounter (Signed)
Appears to be sinus with PACs.  I agree that artifact makes review challenging.  Continue current follow-up without medicine change at this time.

## 2019-03-15 DIAGNOSIS — Z9181 History of falling: Secondary | ICD-10-CM | POA: Diagnosis not present

## 2019-03-15 DIAGNOSIS — I69151 Hemiplegia and hemiparesis following nontraumatic intracerebral hemorrhage affecting right dominant side: Secondary | ICD-10-CM | POA: Diagnosis not present

## 2019-03-15 DIAGNOSIS — R2689 Other abnormalities of gait and mobility: Secondary | ICD-10-CM | POA: Diagnosis not present

## 2019-03-15 DIAGNOSIS — R278 Other lack of coordination: Secondary | ICD-10-CM | POA: Diagnosis not present

## 2019-03-15 DIAGNOSIS — F0151 Vascular dementia with behavioral disturbance: Secondary | ICD-10-CM | POA: Diagnosis not present

## 2019-03-15 DIAGNOSIS — S93491S Sprain of other ligament of right ankle, sequela: Secondary | ICD-10-CM | POA: Diagnosis not present

## 2019-03-15 DIAGNOSIS — Z9189 Other specified personal risk factors, not elsewhere classified: Secondary | ICD-10-CM | POA: Diagnosis not present

## 2019-03-15 DIAGNOSIS — Z20828 Contact with and (suspected) exposure to other viral communicable diseases: Secondary | ICD-10-CM | POA: Diagnosis not present

## 2019-03-17 DIAGNOSIS — R278 Other lack of coordination: Secondary | ICD-10-CM | POA: Diagnosis not present

## 2019-03-17 DIAGNOSIS — R2689 Other abnormalities of gait and mobility: Secondary | ICD-10-CM | POA: Diagnosis not present

## 2019-03-17 DIAGNOSIS — Z9181 History of falling: Secondary | ICD-10-CM | POA: Diagnosis not present

## 2019-03-17 DIAGNOSIS — I69151 Hemiplegia and hemiparesis following nontraumatic intracerebral hemorrhage affecting right dominant side: Secondary | ICD-10-CM | POA: Diagnosis not present

## 2019-03-17 DIAGNOSIS — F0151 Vascular dementia with behavioral disturbance: Secondary | ICD-10-CM | POA: Diagnosis not present

## 2019-03-17 DIAGNOSIS — S93491S Sprain of other ligament of right ankle, sequela: Secondary | ICD-10-CM | POA: Diagnosis not present

## 2019-03-23 DIAGNOSIS — Z23 Encounter for immunization: Secondary | ICD-10-CM | POA: Diagnosis not present

## 2019-03-25 ENCOUNTER — Ambulatory Visit (INDEPENDENT_AMBULATORY_CARE_PROVIDER_SITE_OTHER): Payer: Medicare Other | Admitting: *Deleted

## 2019-03-25 DIAGNOSIS — I480 Paroxysmal atrial fibrillation: Secondary | ICD-10-CM | POA: Diagnosis not present

## 2019-03-25 LAB — CUP PACEART REMOTE DEVICE CHECK
Date Time Interrogation Session: 20210210235627
Implantable Pulse Generator Implant Date: 20190903

## 2019-03-25 NOTE — Progress Notes (Signed)
ILR Remote 

## 2019-04-14 ENCOUNTER — Encounter: Payer: Self-pay | Admitting: Internal Medicine

## 2019-04-14 ENCOUNTER — Other Ambulatory Visit: Payer: Self-pay

## 2019-04-14 ENCOUNTER — Non-Acute Institutional Stay: Payer: Medicare Other | Admitting: Internal Medicine

## 2019-04-14 VITALS — BP 118/72 | HR 66 | Temp 97.5°F | Ht 61.0 in | Wt 124.0 lb

## 2019-04-14 DIAGNOSIS — R4701 Aphasia: Secondary | ICD-10-CM | POA: Diagnosis not present

## 2019-04-14 DIAGNOSIS — I480 Paroxysmal atrial fibrillation: Secondary | ICD-10-CM

## 2019-04-14 DIAGNOSIS — I10 Essential (primary) hypertension: Secondary | ICD-10-CM

## 2019-04-14 DIAGNOSIS — I69151 Hemiplegia and hemiparesis following nontraumatic intracerebral hemorrhage affecting right dominant side: Secondary | ICD-10-CM

## 2019-04-14 DIAGNOSIS — E039 Hypothyroidism, unspecified: Secondary | ICD-10-CM | POA: Diagnosis not present

## 2019-04-14 DIAGNOSIS — I5032 Chronic diastolic (congestive) heart failure: Secondary | ICD-10-CM

## 2019-04-14 NOTE — Progress Notes (Signed)
Location:  Occupational psychologist of Service:  Clinic (12)  Provider: Shahin Knierim L. Mariea Clonts, D.O., C.M.D.  Code Status: DNR Goals of Care:  Advanced Directives 04/14/2019  Does Patient Have a Medical Advance Directive? Yes  Type of Paramedic of [place];Out of facility DNR (pink MOST or yellow form)  Does patient want to make changes to medical advance directive? No - Patient declined  Copy of [place] in Chart? -  Would patient like information on creating a medical advance directive? -  Pre-existing out of facility DNR order (yellow form or pink MOST form) -     Chief Complaint  Patient presents with  . Medical Management of Chronic Issues    Caridiolosgist appt , uptdate on occupational therapy on ankle , update in person visit     HPI: Patient is a 86 y.o. female seen today for medical management of chronic diseases.    She has an upcoming visit with cardiology.    [name] fell sometime in Dec and twisted her right ankle.  No fxs on xrays, but she was more tentative walking.  PT was recommended and performed from 1/13 to 2/3 for mobility training.  She is using rolling walker with skis.   She'd had prior left ankle fx with ORIFx2 and then hardware removed.  No hurting now.  Not even right ankle.    Bowels are moving well.    Mostly stays in room.  Walks to nurses' station once a day.   She was not winded when she got to the waiting room to meet her son, [name], today.  [name] talks with her once a week and she does not have complaints for him.    Has skin tag on right neck, but it does not bother her.    Past Medical History:  Diagnosis Date  . Abdominal mass   . AKI (acute kidney injury) ([place]) 09/15/2014  . Atrial fibrillation ([place])   . Colitis, ulcerative ([place])   . Cough    WHITE SPUTUM FOR 2 WEEKS, NO FEVER  . Degenerative joint disease   . Diverticulosis   . DVT (deep venous thrombosis) ([place]) 2009   BOTH  LUNGS  . GERD (gastroesophageal reflux disease)   . Hyperlipidemia   . Hyperplastic colon polyp   . Hypertension   . Hypothyroidism   . Iron deficiency anemia    ON IRON AT TIMES  . PE (pulmonary embolism) 2009  . Persistent atrial fibrillation ([place])   . Positive H. pylori test 01/20/96  . S/P dilatation of esophageal stricture 1992  . Skin cancer    h/o basal and squamous skin cancer--> removed  . Stroke [facility])     Past Surgical History:  Procedure Laterality Date  . APPENDECTOMY    . BREAST EXCISIONAL BIOPSY Left 1992   benign  . BREAST LUMPECTOMY     benign  . DILATION AND CURETTAGE OF UTERUS     for endometrial polyps  . ENDOSCOPIC RETROGRADE CHOLANGIOPANCREATOGRAPHY (ERCP) WITH PROPOFOL N/A 03/23/2015   Procedure: ENDOSCOPIC RETROGRADE CHOLANGIOPANCREATOGRAPHY (ERCP) WITH PROPOFOL;  Surgeon: Milus Banister, MD;  [facility];  Service: Endoscopy;  Laterality: N/A;  . JOINT REPLACEMENT    . LOOP RECORDER INSERTION N/A 10/14/2017   Procedure: LOOP RECORDER INSERTION;  Surgeon: Thompson Grayer, MD;  Location: [place] CV LAB;  Service: Cardiovascular;  Laterality: N/A;  . ORIF ANKLE FRACTURE Left    X 2  . REPLACEMENT TOTAL  KNEE     left  . SURGERY DONE TO REMOVE LEFT ANKLE HARDWARE LATER    . TONSILLECTOMY      Allergies  Allergen Reactions  . Iohexol Hives  . Ivp Dye [Iodinated Diagnostic Agents] Hives    Was able to tolerate Contrast Media for CT scan.  [name] [Shellfish Allergy] Nausea And Vomiting    Projectile vomiting  . Shellfish-Derived Products Nausea And Vomiting  . Sulfa Antibiotics Hives  . Sulfamethoxazole Hives    Outpatient Encounter Medications as of 04/14/2019  Medication Sig  . acetaminophen (TYLENOL) 500 MG tablet Take 1,000 mg by mouth 2 (two) times daily.   Marland Kitchen amiodarone (PACERONE) 100 MG tablet Take 1 tablet (100 mg total) by mouth daily.  Marland Kitchen aspirin 81 MG tablet Take 1 tablet (81 mg total) by mouth daily.  Marland Kitchen atorvastatin  (LIPITOR) 10 MG tablet Take 10 mg by mouth daily.  . Ca Phosphate-Cholecalciferol (CALTRATE GUMMY BITES PO) Take 1 tablet by mouth 2 (two) times daily.  . carvedilol (COREG) 6.25 MG tablet Take 12.5 mg by mouth 2 (two) times daily with a meal.  . docusate sodium (COLACE) 100 MG capsule Take 100 mg by mouth every other day.  . Hypromellose (GONIOVISC) 2 % SOLN Place 1 drop into both eyes daily as needed.  Marland Kitchen ipratropium-albuterol (DUONEB) 0.5-2.5 (3) MG/3ML SOLN Take 3 mLs by nebulization every 6 (six) hours as needed.  Marland Kitchen levothyroxine (SYNTHROID) 88 MCG tablet Take 1 tablet by mouth daily.  . Melatonin 5 MG TABS Take 5 mg by mouth at bedtime.  . Menthol-Methyl Salicylate (ICY HOT EXTRA STRENGTH) 10-30 % CREA Apply topically 2 (two) times daily.  . ondansetron (ZOFRAN) 4 MG tablet Take 4 mg by mouth every 6 (six) hours as needed for nausea or vomiting.  . pantoprazole (PROTONIX) 40 MG tablet Take 40 mg by mouth daily.  . polyethylene glycol (MIRALAX / GLYCOLAX) 17 g packet Take 17 g by mouth daily as needed. Hold if loose stools occur  . potassium chloride 20 MEQ/15ML (10%) SOLN Take 7.5 mLs (10 mEq total) by mouth daily.  . Simethicone (GAS RELIEF PO) Take 1 tablet by mouth 2 (two) times daily.  . sodium chloride 1 g tablet Take 1 g by mouth 2 (two) times daily with a meal.  . tiZANidine (ZANAFLEX) 2 MG tablet Take 1 tablet (2 mg total) by mouth 4 (four) times daily -  with meals and at bedtime.  . WITCH HAZEL EX Apply topically as needed.   No facility-administered encounter medications on file as of 04/14/2019.    Review of Systems:  Review of Systems  Constitutional: Negative for chills, fever and malaise/fatigue.  HENT: Negative for congestion, hearing loss and sore throat.   Eyes: Negative for blurred vision.  Respiratory: Negative for cough and shortness of breath.   Cardiovascular: Negative for chest pain, palpitations and leg swelling.  Gastrointestinal: Negative for abdominal pain  and constipation.  Genitourinary: Negative for dysuria.  Musculoskeletal: Positive for falls. Negative for joint pain.       Ankle better  Skin: Negative for itching and rash.  Neurological: Positive for speech change and focal weakness. Negative for loss of consciousness.       Chronic s/p cva  Endo/Heme/Allergies: Bruises/bleeds easily.  Psychiatric/Behavioral: Negative for depression. The patient is not nervous/anxious and does not have insomnia.     Health Maintenance  Topic Date Due  . TETANUS/TDAP  04/14/2018  . INFLUENZA VACCINE  Completed  .  DEXA SCAN  Completed  . PNA vac Low Risk Adult  Completed    Physical Exam: Vitals:   04/14/19 1339  Weight: 124 lb (56.2 kg)  Height: 5' 1"  (1.549 m)   Body mass index is 23.43 kg/m. Physical Exam Vitals reviewed.  Constitutional:      Appearance: Normal appearance.  HENT:     Head: Normocephalic and atraumatic.  Cardiovascular:     Rate and Rhythm: Rhythm irregular.     Heart sounds: No murmur.  Pulmonary:     Effort: Pulmonary effort is normal.     Breath sounds: Normal breath sounds. No wheezing, rhonchi or rales.  Abdominal:     General: Bowel sounds are normal.  Musculoskeletal:     Right lower leg: No edema.     Left lower leg: No edema.  Skin:    General: Skin is warm and dry.  Neurological:     Mental Status: She is alert. Mental status is at baseline.     Cranial Nerves: Cranial nerve deficit present.     Motor: Weakness present.     Gait: Gait abnormal.     Comments: Aphasia, hemiparesis unchanged, uses rolling walker  Psychiatric:        Mood and Affect: Mood normal.     Labs reviewed: Basic Metabolic Panel: Recent Labs    05/11/18 0100 05/13/18 0000 05/18/18 0700 07/13/18 0700  NA 118* 130*  --   --   K 4.3 3.8  --   --   BUN 34* 26*  --   --   CREATININE 1.4* 1.3*  --   --   TSH  --   --  5.16 1.46   Liver Function Tests: Recent Labs    05/11/18 0100  AST 15  ALT 11  ALKPHOS 96    No results for input(s): LIPASE, AMYLASE in the last 8760 hours. No results for input(s): AMMONIA in the last 8760 hours. CBC: Recent Labs    05/11/18 0100  WBC 8.7  HGB 9.8*  HCT 28*   Lipid Panel: No results for input(s): CHOL, HDL, LDLCALC, TRIG, CHOLHDL, LDLDIRECT in the last 8760 hours. Lab Results  Component Value Date   HGBA1C 5.3 08/02/2013    Procedures since last visit: CUP [place]  Result Date: 03/25/2019 Carelink summary report received. Battery status OK. Normal device function. No new symptom episodes, tachy episodes, brady, or pause episodes. One AF episode detected and was previously viewed.  Monthly summary reports and ROV/PRN Kathy Breach, RN, CCDS, [employer]   Assessment/Plan 1. Expressive aphasia -remains, seems to do better than she did initially and gets less frustrated--seems to go with the flow more now   2. Hemiparesis of right dominant side as late effect of nontraumatic intracerebral hemorrhage (HCC) -cont rolling walker with skis use  3. Chronic diastolic congestive heart failure (HCC) -stable w/o signs of exacerbation, cont current meds and monitor  4. Paroxysmal atrial fibrillation (HCC) -continues with amiodarone, baby asa, coreg  5. Essential hypertension -bp has been satisfactory w/o dizziness -cont same regimen and monitor  6. Hypothyroidism, unspecified type -at goal, cont current levothyroxine Lab Results  Component Value Date   TSH 1.46 07/13/2018   Labs/tests ordered:  No new Next appt:  6 mos for med mgt  Glennice Marcos L. Carmelo Reidel, D.O. [facility] [address], [place] [address] Cell Phone (Mon-Fri 8am-5pm):  [phone] On Call:  [phone] &  follow prompts after 5pm & weekends Office Phone:  [phone] Office Fax:  [phone]

## 2019-04-25 LAB — CUP PACEART REMOTE DEVICE CHECK
Date Time Interrogation Session: 20210314000500
Implantable Pulse Generator Implant Date: 20190903

## 2019-04-26 ENCOUNTER — Ambulatory Visit (INDEPENDENT_AMBULATORY_CARE_PROVIDER_SITE_OTHER): Payer: Medicare Other | Admitting: *Deleted

## 2019-04-26 DIAGNOSIS — I480 Paroxysmal atrial fibrillation: Secondary | ICD-10-CM

## 2019-04-27 LAB — CUP PACEART REMOTE DEVICE CHECK
Date Time Interrogation Session: 20210314004121
Implantable Pulse Generator Implant Date: 20190903

## 2019-04-27 NOTE — Progress Notes (Signed)
ILR Remote 

## 2019-04-28 DIAGNOSIS — E860 Dehydration: Secondary | ICD-10-CM | POA: Diagnosis not present

## 2019-04-28 DIAGNOSIS — E785 Hyperlipidemia, unspecified: Secondary | ICD-10-CM | POA: Diagnosis not present

## 2019-04-28 DIAGNOSIS — E781 Pure hyperglyceridemia: Secondary | ICD-10-CM | POA: Diagnosis not present

## 2019-04-28 DIAGNOSIS — D649 Anemia, unspecified: Secondary | ICD-10-CM | POA: Diagnosis not present

## 2019-04-28 DIAGNOSIS — E039 Hypothyroidism, unspecified: Secondary | ICD-10-CM | POA: Diagnosis not present

## 2019-04-28 DIAGNOSIS — D509 Iron deficiency anemia, unspecified: Secondary | ICD-10-CM | POA: Diagnosis not present

## 2019-04-28 LAB — BASIC METABOLIC PANEL
BUN: 34 — AB (ref 4–21)
CO2: 19 (ref 13–22)
Chloride: 107 (ref 99–108)
Creatinine: 1.5 — AB (ref 0.5–1.1)
Glucose: 117
Potassium: 4.4 (ref 3.4–5.3)
Sodium: 137 (ref 137–147)

## 2019-04-28 LAB — CBC AND DIFFERENTIAL
HCT: 30 — AB (ref 36–46)
HCT: 30 — AB (ref 36–46)
Hemoglobin: 10 — AB (ref 12.0–16.0)
Hemoglobin: 10 — AB (ref 12.0–16.0)
Platelets: 237 (ref 150–399)
Platelets: 237 (ref 150–399)
WBC: 7.2
WBC: 7.2

## 2019-04-28 LAB — LIPID PANEL
Cholesterol: 137 (ref 0–200)
Cholesterol: 137 (ref 0–200)
HDL: 41 (ref 35–70)
HDL: 41 (ref 35–70)
LDL Cholesterol: 74
LDL Cholesterol: 74
Triglycerides: 110 (ref 40–160)
Triglycerides: 110 (ref 40–160)

## 2019-04-28 LAB — COMPREHENSIVE METABOLIC PANEL
Albumin: 3.2 — AB (ref 3.5–5.0)
Calcium: 8.7 (ref 8.7–10.7)
Globulin: 3

## 2019-04-28 LAB — CBC
RBC: 3.39 — AB (ref 3.87–5.11)
RBC: 3.39 — AB (ref 3.87–5.11)

## 2019-05-05 ENCOUNTER — Encounter: Payer: Self-pay | Admitting: Internal Medicine

## 2019-05-06 NOTE — Progress Notes (Addendum)
Cardiology Office Note Date:  05/07/2019  Patient ID:  Sara Russell, Sara Russell 1930/09/08, MRN [id number] PCP:  [name], DO  Electrophysiologist:  Dr. [name]    Chief Complaint: loop visit  History of Present Illness: Sara Russell is a 86 y.o. female with history of stroke/ICH, HTN, HLD, hypothyroidism, DVT/PE, GERD, esophageal stricture (s/p dilation), Afib, CKD (III), chronic anemia.  She come sin today to be seen for Dr. [name].  Last seen by him via tele health visit June 2020, doing well.  Off a/c 2/2 ICH, monitoring AFib burden via loop.   She has had false AFib detections associated with PACs, artifact  She comes today accompanied by her son.  The patient with some baseline word finding difficulties infrequently, does well communicating today.  She denies any cardiac concerns, today's visit is a routine check in as far as she and her son are aware.   She denies any CP, palpitations, no SOB, no dizzy spells, near syncope or syncope.   Device information MDT loop implanted 10/14/2017  Afib Hx Diagnosis goes back as far as her epic record goes, 2017 June 2019 suffered ICH and Eliquis stopped, notes mention that after resolution, she was cleared to resume [place] though in conversation with the patient consideration for watchman, loop implant to monitor Afib burden, and resume a/c, she chose loop implant and monitoring.  AAD hx Amiodarone at least since 2017  Past Medical History:  Diagnosis Date  . Abdominal mass   . AKI (acute kidney injury) ([place]) 09/15/2014  . Atrial fibrillation ([place])   . Colitis, ulcerative ([place])   . Cough    WHITE SPUTUM FOR 2 WEEKS, NO FEVER  . Degenerative joint disease   . Diverticulosis   . DVT (deep venous thrombosis) ([place]) 2009   BOTH LUNGS  . GERD (gastroesophageal reflux disease)   . Hyperlipidemia   . Hyperplastic colon polyp   . Hypertension   . Hypothyroidism   . Iron deficiency anemia    ON IRON AT TIMES  . PE (pulmonary  embolism) 2009  . Persistent atrial fibrillation ([place])   . Positive H. pylori test 01/20/96  . S/P dilatation of esophageal stricture 1992  . Skin cancer    h/o basal and squamous skin cancer--> removed  . Stroke [facility])     Past Surgical History:  Procedure Laterality Date  . APPENDECTOMY    . BREAST EXCISIONAL BIOPSY Left 1992   benign  . BREAST LUMPECTOMY     benign  . DILATION AND CURETTAGE OF UTERUS     for endometrial polyps  . ENDOSCOPIC RETROGRADE CHOLANGIOPANCREATOGRAPHY (ERCP) WITH PROPOFOL N/A 03/23/2015   Procedure: ENDOSCOPIC RETROGRADE CHOLANGIOPANCREATOGRAPHY (ERCP) WITH PROPOFOL;  Surgeon: Milus Banister, MD;  [facility];  Service: Endoscopy;  Laterality: N/A;  . JOINT REPLACEMENT    . LOOP RECORDER INSERTION N/A 10/14/2017   Procedure: LOOP RECORDER INSERTION;  Surgeon: Thompson Grayer, MD;  Location: [place] CV LAB;  Service: Cardiovascular;  Laterality: N/A;  . ORIF ANKLE FRACTURE Left    X 2  . REPLACEMENT TOTAL KNEE     left  . SURGERY DONE TO REMOVE LEFT ANKLE HARDWARE LATER    . TONSILLECTOMY      Current Outpatient Medications  Medication Sig Dispense Refill  . acetaminophen (TYLENOL) 500 MG tablet Take 1,000 mg by mouth 2 (two) times daily.     Marland Kitchen amiodarone (PACERONE) 100 MG tablet Take 1 tablet (100 mg total) by mouth daily.  90 tablet 3  . aspirin 81 MG tablet Take 1 tablet (81 mg total) by mouth daily. 30 tablet   . atorvastatin (LIPITOR) 10 MG tablet Take 10 mg by mouth daily.    . Ca Phosphate-Cholecalciferol (CALTRATE GUMMY BITES PO) Take 1 tablet by mouth 2 (two) times daily.    . carvedilol (COREG) 6.25 MG tablet Take 12.5 mg by mouth 2 (two) times daily with a meal.    . docusate sodium (COLACE) 100 MG capsule Take 100 mg by mouth every other day.    . Hypromellose (GONIOVISC) 2 % SOLN Place 1 drop into both eyes daily as needed.    Marland Kitchen ipratropium-albuterol (DUONEB) 0.5-2.5 (3) MG/3ML SOLN Take 3 mLs by nebulization every 6 (six) hours  as needed.    Marland Kitchen levothyroxine (SYNTHROID) 88 MCG tablet Take 1 tablet by mouth daily.    . Melatonin 5 MG TABS Take 5 mg by mouth at bedtime.    . Menthol-Methyl Salicylate (ICY HOT EXTRA STRENGTH) 10-30 % CREA Apply topically 2 (two) times daily.    . ondansetron (ZOFRAN) 4 MG tablet Take 4 mg by mouth every 6 (six) hours as needed for nausea or vomiting.    . pantoprazole (PROTONIX) 40 MG tablet Take 40 mg by mouth daily.    . polyethylene glycol (MIRALAX / GLYCOLAX) 17 g packet Take 17 g by mouth daily as needed. Hold if loose stools occur    . potassium chloride 20 MEQ/15ML (10%) SOLN Take 7.5 mLs (10 mEq total) by mouth daily. 450 mL 0  . Simethicone (GAS RELIEF PO) Take 1 tablet by mouth 2 (two) times daily.    . sodium chloride 1 g tablet Take 1 g by mouth 2 (two) times daily with a meal.    . tiZANidine (ZANAFLEX) 2 MG tablet Take 1 tablet (2 mg total) by mouth 4 (four) times daily -  with meals and at bedtime. 30 tablet 0  . WITCH HAZEL EX Apply topically as needed.     No current facility-administered medications for this visit.    Allergies:   Iohexol, Ivp dye [iodinated diagnostic agents], Scallops [shellfish allergy], Shellfish-derived products, Sulfa antibiotics, and Sulfamethoxazole   Social History:  The patient  reports that she quit smoking about 58 years ago. Her smoking use included cigarettes. She started smoking about 71 years ago. She has a 6.50 pack-year smoking history. She has never used smokeless tobacco. She reports current alcohol use of about 5.0 standard drinks of alcohol per week. She reports that she does not use drugs.   Family History:  The patient's family history includes Atrial fibrillation in her father; Breast cancer in her maternal aunt; Dementia in her mother; Diverticulitis in her brother; Esophageal cancer in her brother; Heart attack in her mother; Heart disease in her father and mother; Stroke in her mother.  ROS:  Please see the history of present  illness.  All other systems are reviewed and otherwise negative.   PHYSICAL EXAM:  VS:  BP 124/82   Pulse 83   Ht 5' 3"  (1.6 m)   Wt 127 lb (57.6 kg)   SpO2 97%   BMI 22.50 kg/m  BMI: Body mass index is 22.5 kg/m. Well nourished, well developed, in no acute distress  HEENT: normocephalic, atraumatic  Neck: no JVD, carotid bruits or masses Cardiac: RRR; no significant murmurs, no rubs, or gallops Lungs:  CTA b/l, no wheezing, rhonchi or rales  Abd: soft, nontender MS: no deformity, age appropriate  atrophy Ext: no edema  Skin: warm and dry, no rash Neuro:  No gross deficits appreciated Psych: euthymic mood, full affect  ILR site is stable, no tethering or discomfort   EKG:  Done today and reviewed by myself shows SR 83bpm, no changes from prior  ILR interrogation done today and reviewed by myself;  Battery is good R waves 1.53m 17 AFib episodes, most have been previously reviewed All felt to be SR with PACs No true AFib noted today   08/04/2017: TTE Study Conclusions  - Left ventricle: The cavity size was normal. Wall thickness was  normal. Systolic function was normal. The estimated ejection  fraction was in the range of 60% to 65%. Wall motion was normal;  there were no regional wall motion abnormalities. Features are  consistent with a pseudonormal left ventricular filling pattern,  with concomitant abnormal relaxation and increased filling  pressure (grade 2 diastolic dysfunction).  - Mitral valve: Calcified annulus.  - Tricuspid valve: There was mild-moderate regurgitation directed  centrally.  - Pulmonary arteries: Systolic pressure was mildly increased. PA  peak pressure: 41 mm Hg (S).   Recent Labs: 05/11/2018: ALT 11 07/13/2018: TSH 1.46 04/28/2019: BUN 34; Creatinine 1.5; Hemoglobin 10.0; Platelets 237; Potassium 4.4; Sodium 137  04/28/2019: Cholesterol 137; HDL 41; LDL Cholesterol 74; Triglycerides 110   Estimated Creatinine Clearance:  22.3 mL/min (A) (by C-G formula based on SCr of 1.5 mg/dL (A)).   Wt Readings from Last 3 Encounters:  05/07/19 127 lb (57.6 kg)  04/14/19 124 lb (56.2 kg)  01/20/19 122 lb 12.8 oz (55.7 kg)     Other studies reviewed: Additional studies/records reviewed today include: summarized above  ASSESSMENT AND PLAN:  1. Paroxysmal Afib     CHA2DS2Vasc is 6, off a/c 2/2 ICH     Amiodarone     Maintaining SR     Almost a year since her last TSH, will get today given her amiodarone     LFTs look good on labs from [employer] [facility]  2. HTN     Looks good, no changes  3. HLD     Monitored and managed with her PMD     Look good    Disposition: F/u with monthly remotes, in clinic (or virtual if need) in 1 year, sooner if needed  Current medicines are reviewed at length with the patient today.  The patient did not have any concerns regarding medicines.  SVenetia Night PA-C 05/07/2019 9:08 AM     [place] [phone](office)  [phone](fax)

## 2019-05-07 ENCOUNTER — Ambulatory Visit (INDEPENDENT_AMBULATORY_CARE_PROVIDER_SITE_OTHER): Payer: Medicare Other | Admitting: Physician Assistant

## 2019-05-07 VITALS — BP 124/82 | HR 83 | Ht 63.0 in | Wt 127.0 lb

## 2019-05-07 DIAGNOSIS — I480 Paroxysmal atrial fibrillation: Secondary | ICD-10-CM | POA: Diagnosis not present

## 2019-05-07 DIAGNOSIS — I10 Essential (primary) hypertension: Secondary | ICD-10-CM

## 2019-05-07 DIAGNOSIS — Z79899 Other long term (current) drug therapy: Secondary | ICD-10-CM

## 2019-05-07 DIAGNOSIS — E785 Hyperlipidemia, unspecified: Secondary | ICD-10-CM

## 2019-05-07 LAB — TSH: TSH: 0.208 u[IU]/mL — ABNORMAL LOW (ref 0.450–4.500)

## 2019-05-07 NOTE — Patient Instructions (Signed)
Medication Instructions:   Your physician recommends that you continue on your current medications as directed. Please refer to the Current Medication list given to you today.  *If you need a refill on your cardiac medications before your next appointment, please call your pharmacy*   Lab Work:  TSH TODAY   If you have labs (blood work) drawn today and your tests are completely normal, you will receive your results only by: Marland Kitchen MyChart Message (if you have MyChart) OR . A paper copy in the mail If you have any lab test that is abnormal or we need to change your treatment, we will call you to review the results.   Testing/Procedures:  NONE ORDERED  TODAY    Follow-Up: At [facility], you and your health needs are our priority.  As part of our continuing mission to provide you with exceptional heart care, we have created designated Provider Care Teams.  These Care Teams include your primary Cardiologist (physician) and Advanced Practice Providers (APPs -  Physician Assistants and Nurse Practitioners) who all work together to provide you with the care you need, when you need it.  We recommend signing up for the patient portal called "MyChart".  Sign up information is provided on this After Visit Summary.  MyChart is used to connect with patients for Virtual Visits (Telemedicine).  Patients are able to view lab/test results, encounter notes, upcoming appointments, etc.  Non-urgent messages can be sent to your provider as well.   To learn more about what you can do with MyChart, go to [email or web address].    Your next appointment:   1 year(s)  The format for your next appointment:   Either In Person or Virtual  Provider:   You may see Dr [name] or one of the following Advanced Practice Providers on your designated Care Team:    Chanetta Marshall, NP  Tommye Standard, PA-C  Legrand Como "Jonni Sanger" Chalmers Cater, [place]    Other Instructions

## 2019-05-12 ENCOUNTER — Other Ambulatory Visit: Payer: Self-pay | Admitting: Internal Medicine

## 2019-05-12 DIAGNOSIS — E039 Hypothyroidism, unspecified: Secondary | ICD-10-CM

## 2019-05-12 MED ORDER — LEVOTHYROXINE SODIUM 75 MCG PO TABS: 75.0000 ug | ORAL_TABLET | Freq: Every day | ORAL | 5 refills | Status: AC

## 2019-05-13 ENCOUNTER — Encounter: Payer: Self-pay | Admitting: Internal Medicine

## 2019-05-25 DIAGNOSIS — M79672 Pain in left foot: Secondary | ICD-10-CM | POA: Diagnosis not present

## 2019-05-25 DIAGNOSIS — B351 Tinea unguium: Secondary | ICD-10-CM | POA: Diagnosis not present

## 2019-05-25 DIAGNOSIS — M79671 Pain in right foot: Secondary | ICD-10-CM | POA: Diagnosis not present

## 2019-05-27 ENCOUNTER — Ambulatory Visit (INDEPENDENT_AMBULATORY_CARE_PROVIDER_SITE_OTHER): Payer: Medicare Other | Admitting: *Deleted

## 2019-05-27 DIAGNOSIS — I480 Paroxysmal atrial fibrillation: Secondary | ICD-10-CM | POA: Diagnosis not present

## 2019-05-27 LAB — CUP PACEART REMOTE DEVICE CHECK
Date Time Interrogation Session: 20210414232424
Implantable Pulse Generator Implant Date: 20190903

## 2019-05-27 NOTE — Progress Notes (Signed)
ILR Remote 

## 2019-06-02 DIAGNOSIS — L570 Actinic keratosis: Secondary | ICD-10-CM | POA: Diagnosis not present

## 2019-06-02 DIAGNOSIS — D485 Neoplasm of uncertain behavior of skin: Secondary | ICD-10-CM | POA: Diagnosis not present

## 2019-06-02 DIAGNOSIS — D1801 Hemangioma of skin and subcutaneous tissue: Secondary | ICD-10-CM | POA: Diagnosis not present

## 2019-06-02 DIAGNOSIS — C4442 Squamous cell carcinoma of skin of scalp and neck: Secondary | ICD-10-CM | POA: Diagnosis not present

## 2019-06-02 DIAGNOSIS — L821 Other seborrheic keratosis: Secondary | ICD-10-CM | POA: Diagnosis not present

## 2019-06-07 ENCOUNTER — Telehealth: Payer: Self-pay | Admitting: *Deleted

## 2019-06-07 NOTE — Telephone Encounter (Signed)
LINQ alert received for AF episode on 06/04/19 at 07:56, 68mn duration. OGarfieldpreviously d/c due to [place] on amiodarone 1057mdaily. Previous "AF" episodes have shown SR w/PACs. Routed to Dr. AlRayann Hemanor review and recommendations.

## 2019-06-11 NOTE — Telephone Encounter (Signed)
Pt had another episode labeled as AF

## 2019-06-21 NOTE — Telephone Encounter (Signed)
Patient had another episode labeled as AF.

## 2019-06-25 NOTE — Telephone Encounter (Signed)
Episodes reviewed with Dr. [name] in-clinic on 06/23/19. Dr. [name] recommended no medication changes at this time. Plan to continue monitoring for sustained AF episodes. Will offer DC appointment for ILR reprogramming (AF storage to >= 10 minutes, episode detection to less sensitive per Dr. [name]).   LMOVM for [name] [facility]). Direct number and office hours provided.

## 2019-06-25 NOTE — Telephone Encounter (Signed)
Spoke with [name] (DPR). He is agreeable to a DC appointment within the next month or so and is agreeable to scheduler contacting him next week to arrange appointment. Routed to [place] for assistance.

## 2019-06-28 ENCOUNTER — Ambulatory Visit (INDEPENDENT_AMBULATORY_CARE_PROVIDER_SITE_OTHER): Payer: Medicare Other | Admitting: *Deleted

## 2019-06-28 DIAGNOSIS — I480 Paroxysmal atrial fibrillation: Secondary | ICD-10-CM

## 2019-06-28 DIAGNOSIS — I5032 Chronic diastolic (congestive) heart failure: Secondary | ICD-10-CM | POA: Diagnosis not present

## 2019-06-28 LAB — CUP PACEART REMOTE DEVICE CHECK
Date Time Interrogation Session: 20210515234156
Implantable Pulse Generator Implant Date: 20190903

## 2019-06-29 NOTE — Progress Notes (Signed)
Carelink Summary Report / Loop Recorder 

## 2019-06-30 DIAGNOSIS — C4442 Squamous cell carcinoma of skin of scalp and neck: Secondary | ICD-10-CM | POA: Diagnosis not present

## 2019-07-05 ENCOUNTER — Encounter: Payer: Self-pay | Admitting: Internal Medicine

## 2019-07-07 IMAGING — MR MR HEAD W/O CM
10 of 11 series · 42 of 48 positions shown · non-contrast
Comparison: Prior CT from 08/02/2017.

CLINICAL DATA: Initial evaluation for acute intracranial
hemorrhage.

EXAM:
MRI HEAD WITHOUT CONTRAST
TECHNIQUE: Multiplanar, multiecho pulse sequences of the brain and surrounding
structures were obtained without intravenous contrast.

[Series 5: ax dwi_tracew · axial · 3.0mm · 1.50mm/px · z∈[-17,+114]mm · 8 of 80 slices shown]
[im 1/80]
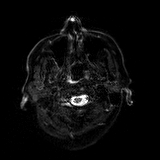
[im 12/80]
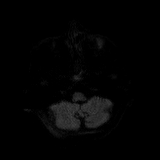
[im 23/80]
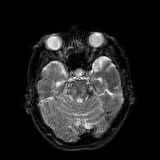
[im 34/80]
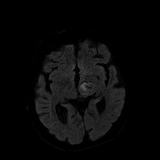
[im 46/80]
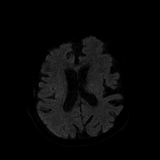
[im 57/80]
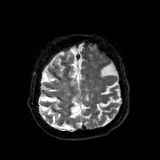
[im 68/80]
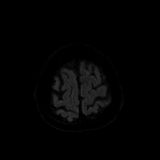
[im 80/80]
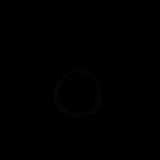

[Series 6: ax dwi_adc · axial · 3.0mm · 1.50mm/px · z∈[-17,+114]mm · 4 of 40 slices shown]
[im 1/40]
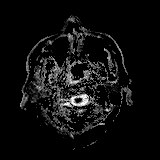
[im 14/40]
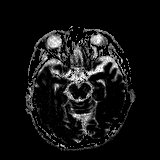
[im 27/40]
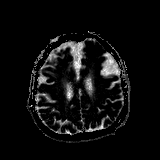
[im 40/40]
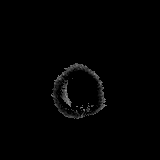

[Series 9: T1 · sagittal · 5.0mm · 0.75mm/px · 2 of 25 slices shown]
[im 1/25]
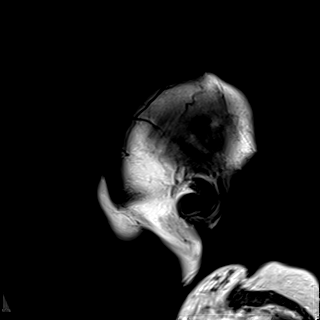
[im 25/25]
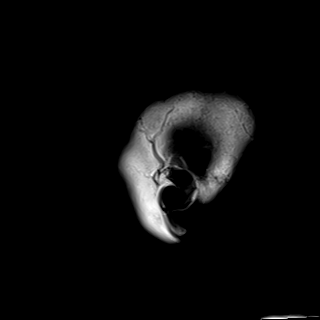

[Series 10: T2 · axial · 5.0mm · 0.69mm/px · z∈[-22,+109]mm · 2 of 25 slices shown (1 of 2)]
[im 1/25]
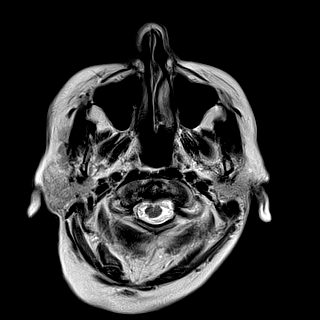
[im 25/25]
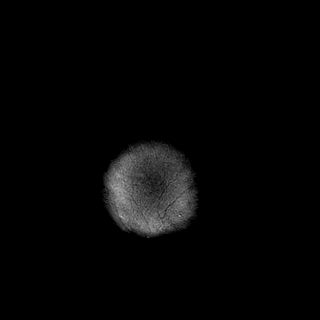

[Series 11: FLAIR · axial · 5.0mm · 0.43mm/px · z∈[-18,+113]mm · 2 of 25 slices shown]
[im 1/25]
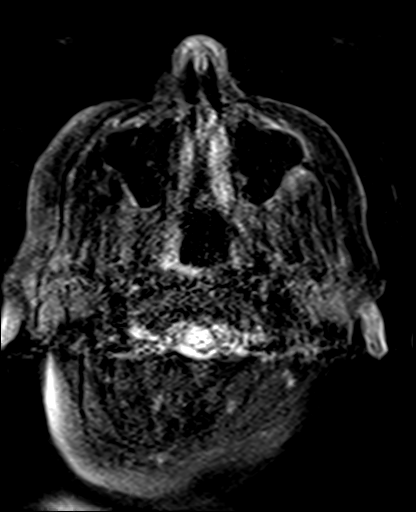
[im 25/25]
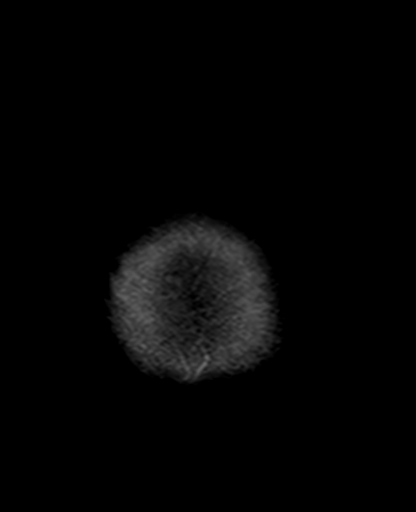

[Series 12: swi_images · axial · 3.0mm · 0.86mm/px · z∈[-33,+129]mm · 6 of 60 slices shown]
[im 1/60]
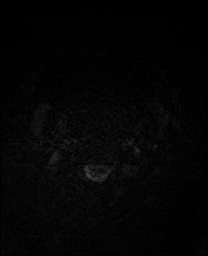
[im 12/60]
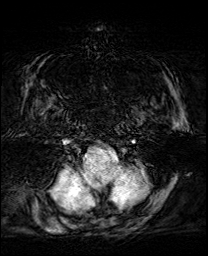
[im 24/60]
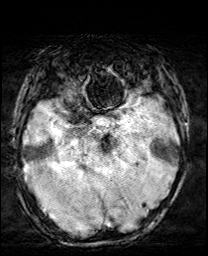
[im 36/60]
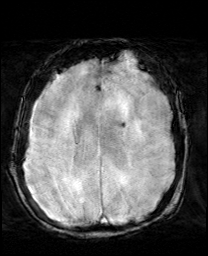
[im 48/60]
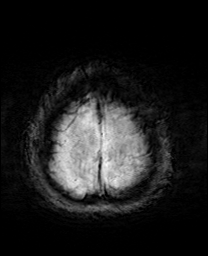
[im 60/60]
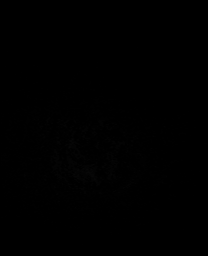

[Series 13: mip_images(sw) · axial · 24.0mm · 0.86mm/px · z∈[-23,+119]mm · 5 of 53 slices shown]
[im 1/53]
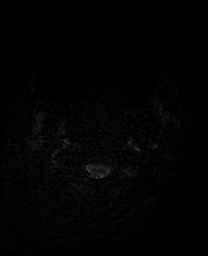
[im 14/53]
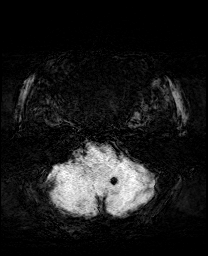
[im 27/53]
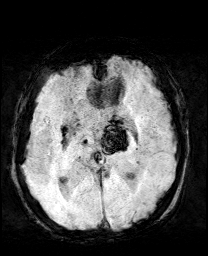
[im 40/53]
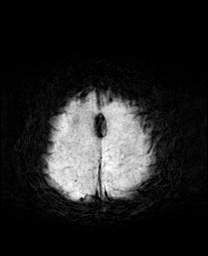
[im 53/53]
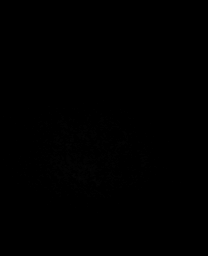

[Series 15: T2 · coronal · 5.0mm · 0.34mm/px · 3 of 29 slices shown (2 of 2)]
[im 1/29]
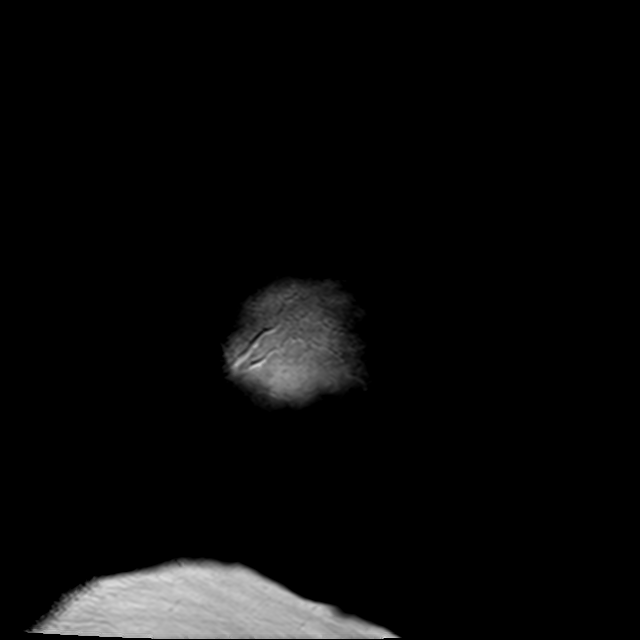
[im 15/29]
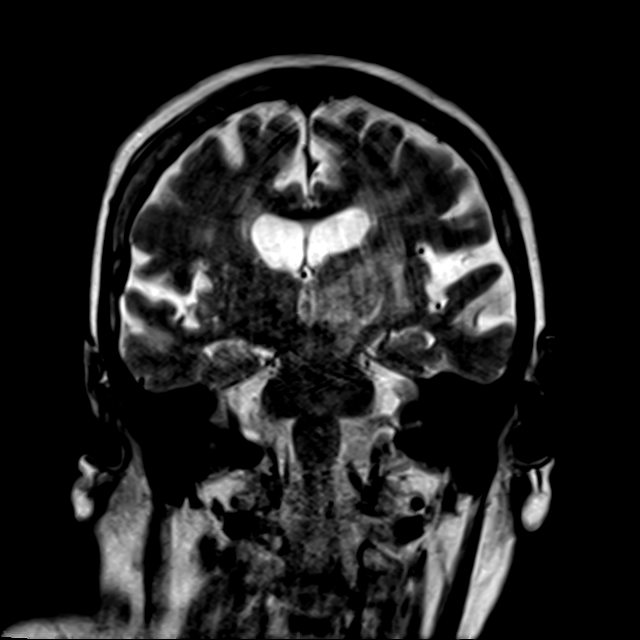
[im 29/29]
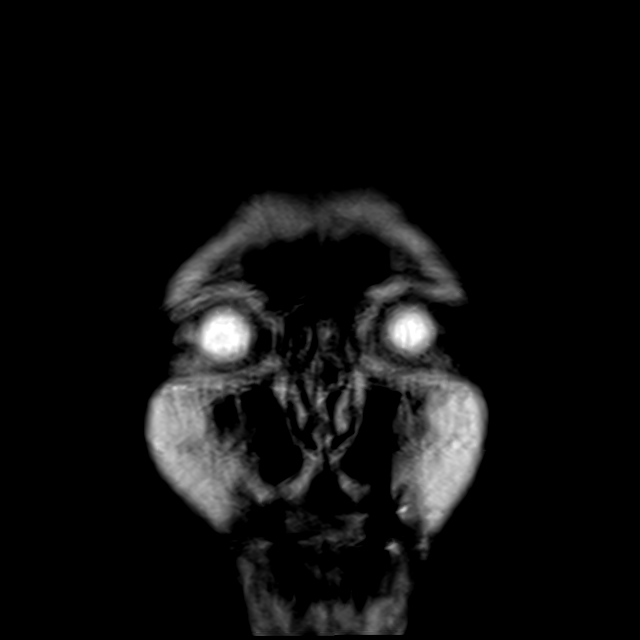

[Series 16: cor dwi_tracew · coronal · 5.0mm · 1.44mm/px · 7 of 68 slices shown]
[im 1/68]
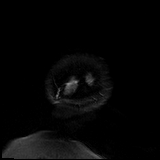
[im 12/68]
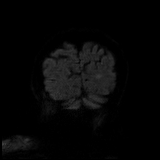
[im 23/68]
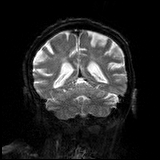
[im 34/68]
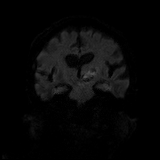
[im 45/68]
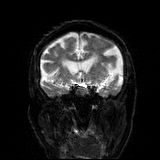
[im 56/68]
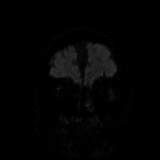
[im 68/68]
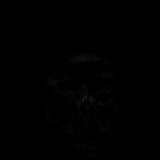

[Series 17: cor dwi_adc · coronal · 5.0mm · 1.44mm/px · 3 of 34 slices shown]
[im 1/34]
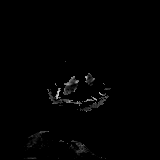
[im 17/34]
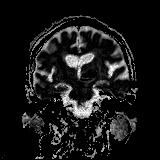
[im 34/34]
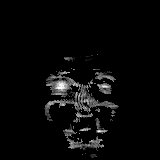

[42 of 48 positions shown; findings below may reference images not displayed]

FINDINGS: Brain: Study moderately degraded by motion artifact.

Acute intraparenchymal hematoma centered at the left
thalamus/internal capsule again seen, relatively stable in size
measuring approximately 1.9 x 2.0 x 2.5 cm. Localized vasogenic
edema and mass effect slightly increased from previous. Mild mass
effect on the adjacent third ventricle with trace localized
left-to-right shift. No intraventricular extension. No appreciable
underlying mass or other abnormality.

Atrophy with chronic microvascular ischemic changes. Small foci of
mildly hyperintense DWI signal at the anterior left frontal lobe
(series 5, image 71) as well as the left parieto-occipital region
(series 5, image 59) consistent with small subacute ischemic
infarcts. No other evidence for acute or subacute ischemia.
Gray-white matter differentiation otherwise maintained. Several
scattered chronic micro hemorrhages seen within the cerebellum, deep
gray nuclei, and bilateral cerebral hemispheres, favored to be
hypertensive in nature.

No mass lesion. Ventricles normal in size without hydrocephalus. No
extra-axial fluid collection. Incidental note made of a partially
empty sella.

Vascular: Major intracranial vascular flow voids are maintained.

Skull and upper cervical spine: Craniocervical junction within
normal limits. Upper cervical spine normal. Bone marrow signal
intensity within normal limits. Scalp soft tissues unremarkable.

Sinuses/Orbits: Globes and orbital soft tissues within normal
limits. Patient status post ocular lens extraction bilaterally. Mild
ethmoidal sinus mucosal thickening. Paranasal sinuses are otherwise
clear. No mastoid effusion. Inner ear structures normal.

Other: None.
IMPRESSION: 1. Similar size of acute left thalamic intraparenchymal hemorrhage,
measuring 1.9 x 2.0 x 2.5 cm. Mildly increased localized edema with
trace left-to-right midline shift. No intraventricular extension,
hydrocephalus, or other complication identified. No underlying mass
or other abnormality.
2. Two subcentimeter subacute ischemic cortical infarcts involving
the left frontal and parietooccipital regions as above.
3. Additional scattered chronic micro hemorrhages involving the
cerebral and cerebellar hemispheres, favored to be secondary to
chronic uncontrolled hypertension.
4. Atrophy with chronic microvascular ischemic disease.

## 2019-07-13 DIAGNOSIS — E039 Hypothyroidism, unspecified: Secondary | ICD-10-CM | POA: Diagnosis not present

## 2019-07-13 LAB — TSH: TSH: 0.94 (ref 0.41–5.90)

## 2019-07-14 NOTE — Telephone Encounter (Signed)
Patient scheduled for DC appointment on 08/10/19.

## 2019-07-27 DIAGNOSIS — M79671 Pain in right foot: Secondary | ICD-10-CM | POA: Diagnosis not present

## 2019-07-27 DIAGNOSIS — B351 Tinea unguium: Secondary | ICD-10-CM | POA: Diagnosis not present

## 2019-07-27 DIAGNOSIS — M79672 Pain in left foot: Secondary | ICD-10-CM | POA: Diagnosis not present

## 2019-07-28 DIAGNOSIS — D1801 Hemangioma of skin and subcutaneous tissue: Secondary | ICD-10-CM | POA: Diagnosis not present

## 2019-07-28 DIAGNOSIS — L570 Actinic keratosis: Secondary | ICD-10-CM | POA: Diagnosis not present

## 2019-07-28 DIAGNOSIS — L821 Other seborrheic keratosis: Secondary | ICD-10-CM | POA: Diagnosis not present

## 2019-07-28 DIAGNOSIS — Z85828 Personal history of other malignant neoplasm of skin: Secondary | ICD-10-CM | POA: Diagnosis not present

## 2019-07-30 ENCOUNTER — Encounter: Payer: Self-pay | Admitting: Internal Medicine

## 2019-08-02 ENCOUNTER — Ambulatory Visit (INDEPENDENT_AMBULATORY_CARE_PROVIDER_SITE_OTHER): Payer: Medicare Other | Admitting: *Deleted

## 2019-08-02 DIAGNOSIS — I639 Cerebral infarction, unspecified: Secondary | ICD-10-CM | POA: Diagnosis not present

## 2019-08-03 LAB — CUP PACEART REMOTE DEVICE CHECK
Date Time Interrogation Session: 20210621001816
Implantable Pulse Generator Implant Date: 20190903

## 2019-08-03 NOTE — Progress Notes (Signed)
Carelink Summary Report / Loop Recorder 

## 2019-08-10 ENCOUNTER — Telehealth: Payer: Self-pay

## 2019-08-10 ENCOUNTER — Other Ambulatory Visit: Payer: Self-pay

## 2019-08-10 ENCOUNTER — Ambulatory Visit (INDEPENDENT_AMBULATORY_CARE_PROVIDER_SITE_OTHER): Payer: Medicare Other | Admitting: Emergency Medicine

## 2019-08-10 DIAGNOSIS — Z95818 Presence of other cardiac implants and grafts: Secondary | ICD-10-CM

## 2019-08-10 DIAGNOSIS — I480 Paroxysmal atrial fibrillation: Secondary | ICD-10-CM

## 2019-08-10 LAB — CUP PACEART INCLINIC DEVICE CHECK
Date Time Interrogation Session: 20210629094802
Implantable Pulse Generator Implant Date: 20190903

## 2019-08-10 NOTE — Telephone Encounter (Signed)
The nurse from [facility] tried to send the transmission but was unsuccessful. I gave her the number to tech support to get additional help.

## 2019-08-10 NOTE — Progress Notes (Signed)
Loop check in clinic for reprogramming. Battery status: good. R-waves 1.60m. No symptom or tachy episodes. Pause and brady detection remain off. 14 AF episodes--available ECGs appear SR w/ectopy, on amiodarone. Per order from Dr. ARayann Heman reprogrammed AF detection to less sensitive and AF recording threshold to >=10 min to minimize false episode detections. Patient's son aware to request a manual transmission from ALF. Monthly summary reports and ROV with Dr. ARayann HemanPRN.

## 2019-09-06 ENCOUNTER — Ambulatory Visit (INDEPENDENT_AMBULATORY_CARE_PROVIDER_SITE_OTHER): Payer: Medicare Other | Admitting: *Deleted

## 2019-09-06 DIAGNOSIS — I5032 Chronic diastolic (congestive) heart failure: Secondary | ICD-10-CM | POA: Diagnosis not present

## 2019-09-07 LAB — CUP PACEART REMOTE DEVICE CHECK
Date Time Interrogation Session: 20210725231625
Implantable Pulse Generator Implant Date: 20190903

## 2019-09-09 NOTE — Progress Notes (Signed)
Carelink Summary Report / Loop Recorder 

## 2019-09-27 DIAGNOSIS — Z9189 Other specified personal risk factors, not elsewhere classified: Secondary | ICD-10-CM | POA: Diagnosis not present

## 2019-09-27 DIAGNOSIS — Z20828 Contact with and (suspected) exposure to other viral communicable diseases: Secondary | ICD-10-CM | POA: Diagnosis not present

## 2019-10-04 DIAGNOSIS — Z20828 Contact with and (suspected) exposure to other viral communicable diseases: Secondary | ICD-10-CM | POA: Diagnosis not present

## 2019-10-04 DIAGNOSIS — Z9189 Other specified personal risk factors, not elsewhere classified: Secondary | ICD-10-CM | POA: Diagnosis not present

## 2019-10-11 ENCOUNTER — Ambulatory Visit (INDEPENDENT_AMBULATORY_CARE_PROVIDER_SITE_OTHER): Payer: Medicare Other | Admitting: *Deleted

## 2019-10-11 DIAGNOSIS — I639 Cerebral infarction, unspecified: Secondary | ICD-10-CM

## 2019-10-12 LAB — CUP PACEART REMOTE DEVICE CHECK
Date Time Interrogation Session: 20210827231829
Implantable Pulse Generator Implant Date: 20190903

## 2019-10-13 NOTE — Progress Notes (Signed)
Carelink Summary Report / Loop Recorder 

## 2019-10-20 ENCOUNTER — Other Ambulatory Visit: Payer: Self-pay

## 2019-10-20 ENCOUNTER — Encounter: Payer: Self-pay | Admitting: Internal Medicine

## 2019-10-20 ENCOUNTER — Non-Acute Institutional Stay: Payer: Medicare Other | Admitting: Internal Medicine

## 2019-10-20 VITALS — BP 138/82 | HR 75 | Temp 98.1°F | Ht 63.0 in | Wt 137.5 lb

## 2019-10-20 DIAGNOSIS — E039 Hypothyroidism, unspecified: Secondary | ICD-10-CM | POA: Diagnosis not present

## 2019-10-20 DIAGNOSIS — I639 Cerebral infarction, unspecified: Secondary | ICD-10-CM

## 2019-10-20 DIAGNOSIS — M25611 Stiffness of right shoulder, not elsewhere classified: Secondary | ICD-10-CM

## 2019-10-20 DIAGNOSIS — R4701 Aphasia: Secondary | ICD-10-CM

## 2019-10-20 DIAGNOSIS — I10 Essential (primary) hypertension: Secondary | ICD-10-CM | POA: Diagnosis not present

## 2019-10-20 DIAGNOSIS — I480 Paroxysmal atrial fibrillation: Secondary | ICD-10-CM

## 2019-10-20 DIAGNOSIS — I69151 Hemiplegia and hemiparesis following nontraumatic intracerebral hemorrhage affecting right dominant side: Secondary | ICD-10-CM

## 2019-10-20 DIAGNOSIS — K52839 Microscopic colitis, unspecified: Secondary | ICD-10-CM

## 2019-10-20 DIAGNOSIS — N1831 Chronic kidney disease, stage 3a: Secondary | ICD-10-CM

## 2019-10-20 NOTE — Progress Notes (Signed)
Location:  Occupational psychologist of Service:  Clinic (12)  Provider: Chabeli Barsamian L. Mariea Clonts, D.O., C.M.D.  Code Status: DNR, MOST Goals of Care:  Advanced Directives 10/20/2019  Does Patient Have a Medical Advance Directive? Yes  Type of Paramedic of [place];Out of facility DNR (pink MOST or yellow form)  Does patient want to make changes to medical advance directive? No - Patient declined  Copy of [place] in Chart? Yes - validated most recent copy scanned in chart (See row information)  Would patient like information on creating a medical advance directive? -  Pre-existing out of facility DNR order (yellow form or pink MOST form) Pink MOST/Yellow Form most recent copy in chart - Physician notified to receive inpatient order     Chief Complaint  Patient presents with  . Medical Management of Chronic Issues    6 month follow up. [name] her son is here   . Health Maintenance    Influenza will be given at [facility]   . Acute Visit    Stiffness in right shoulder     HPI: Patient is a 86 y.o. female seen today for medical management of chronic diseases.    10 days ago or so, she's c/o right arm pain and stiffness.    [name] says she's been doing pretty good.    She has gained weight--10 lbs.  Eating well.  In passing, she might mention to [name] that she's had a loose bm.  Not often.    Past Medical History:  Diagnosis Date  . Abdominal mass   . AKI (acute kidney injury) ([place]) 09/15/2014  . Atrial fibrillation ([place])   . Colitis, ulcerative ([place])   . Cough    WHITE SPUTUM FOR 2 WEEKS, NO FEVER  . Degenerative joint disease   . Diverticulosis   . DVT (deep venous thrombosis) ([place]) 2009   BOTH LUNGS  . GERD (gastroesophageal reflux disease)   . Hyperlipidemia   . Hyperplastic colon polyp   . Hypertension   . Hypothyroidism   . Iron deficiency anemia    ON IRON AT TIMES  . PE (pulmonary embolism) 2009  . Persistent atrial  fibrillation ([place])   . Positive H. pylori test 01/20/96  . S/P dilatation of esophageal stricture 1992  . Skin cancer    h/o basal and squamous skin cancer--> removed  . Stroke [facility])     Past Surgical History:  Procedure Laterality Date  . APPENDECTOMY    . BREAST EXCISIONAL BIOPSY Left 1992   benign  . BREAST LUMPECTOMY     benign  . DILATION AND CURETTAGE OF UTERUS     for endometrial polyps  . ENDOSCOPIC RETROGRADE CHOLANGIOPANCREATOGRAPHY (ERCP) WITH PROPOFOL N/A 03/23/2015   Procedure: ENDOSCOPIC RETROGRADE CHOLANGIOPANCREATOGRAPHY (ERCP) WITH PROPOFOL;  Surgeon: Milus Banister, MD;  Location: [facility] END[facility];  Service: Endoscopy;  Laterality: N/A;  . JOINT REPLACEMENT    . LOOP RECORDER INSERTION N/A 10/14/2017   Procedure: LOOP RECORDER INSERTION;  Surgeon: Thompson Grayer, MD;  Location: [place] [facility] LAB;  Service: Cardiovascular;  Laterality: N/A;  . ORIF ANKLE FRACTURE Left    X 2  . REPLACEMENT TOTAL KNEE     left  . SURGERY DONE TO REMOVE LEFT ANKLE HARDWARE LATER    . TONSILLECTOMY      Allergies  Allergen Reactions  . Iohexol Hives  . Ivp Dye [Iodinated Diagnostic Agents] Hives    Was able to tolerate Contrast  Media for CT scan.  [name] [Shellfish Allergy] Nausea And Vomiting    Projectile vomiting  . Shellfish-Derived Products Nausea And Vomiting  . Sulfa Antibiotics Hives  . Sulfamethoxazole Hives    Outpatient Encounter Medications as of 10/20/2019  Medication Sig  . acetaminophen (TYLENOL) 500 MG tablet Take 1,000 mg by mouth 2 (two) times daily.   Marland Kitchen amiodarone (PACERONE) 100 MG tablet Take 1 tablet (100 mg total) by mouth daily.  Marland Kitchen aspirin 81 MG tablet Take 1 tablet (81 mg total) by mouth daily.  Marland Kitchen atorvastatin (LIPITOR) 10 MG tablet Take 10 mg by mouth daily.  . Ca Phosphate-Cholecalciferol (CALTRATE GUMMY BITES PO) Take 1 tablet by mouth 2 (two) times daily.  . carvedilol (COREG) 6.25 MG tablet Take 12.5 mg by mouth 2 (two) times daily with a meal.    . docusate sodium (COLACE) 100 MG capsule Take 100 mg by mouth every other day.  . Hypromellose (GONIOVISC) 2 % SOLN Place 1 drop into both eyes daily as needed.  Marland Kitchen ipratropium-albuterol (DUONEB) 0.5-2.5 (3) MG/3ML SOLN Take 3 mLs by nebulization every 6 (six) hours as needed.  Marland Kitchen levothyroxine (SYNTHROID) 75 MCG tablet Take 1 tablet (75 mcg total) by mouth daily before breakfast.  . Melatonin 5 MG TABS Take 5 mg by mouth at bedtime.  . Menthol-Methyl Salicylate (ICY HOT EXTRA STRENGTH) 10-30 % CREA Apply topically 2 (two) times daily.  . ondansetron (ZOFRAN) 4 MG tablet Take 4 mg by mouth every 6 (six) hours as needed for nausea or vomiting.  . pantoprazole (PROTONIX) 40 MG tablet Take 40 mg by mouth daily.  . polyethylene glycol (MIRALAX / GLYCOLAX) 17 g packet Take 17 g by mouth daily as needed. Hold if loose stools occur  . potassium chloride 20 MEQ/15ML (10%) SOLN Take 7.5 mLs (10 mEq total) by mouth daily.  . Simethicone (GAS RELIEF PO) Take 1 tablet by mouth 2 (two) times daily.  . sodium chloride 1 g tablet Take 1 g by mouth 2 (two) times daily with a meal.  . tiZANidine (ZANAFLEX) 2 MG tablet Take 1 tablet (2 mg total) by mouth 4 (four) times daily -  with meals and at bedtime.  . WITCH HAZEL EX Apply 1 application topically 2 (two) times daily as needed.    No facility-administered encounter medications on file as of 10/20/2019.    Review of Systems:  Review of Systems  Constitutional: Negative for chills, fever and malaise/fatigue.  Eyes: Negative for blurred vision.  Respiratory: Negative for cough and shortness of breath.   Cardiovascular: Negative for chest pain, palpitations and leg swelling.  Gastrointestinal: Negative for abdominal pain, blood in stool, constipation, diarrhea and melena.       Occasionally loose stool  Genitourinary: Negative for dysuria.  Musculoskeletal: Positive for joint pain. Negative for falls.       Right shoulder stiffness and discomfort comes and  goes  Skin: Negative for itching and rash.  Neurological: Positive for speech change. Negative for dizziness and loss of consciousness.       Chronic aphasia  Psychiatric/Behavioral: Positive for memory loss. The patient is not nervous/anxious and does not have insomnia.     Health Maintenance  Topic Date Due  . TETANUS/TDAP  04/14/2018  . INFLUENZA VACCINE  09/12/2019  . DEXA SCAN  Completed  . COVID-19 Vaccine  Completed  . PNA vac Low Risk Adult  Completed    Physical Exam: Vitals:   10/20/19 1325  BP: 138/82  Pulse: 75  Temp: 98.1 F (36.7 C)  SpO2: 97%  Weight: 137 lb 8 oz (62.4 kg)  Height: 5' 3"  (1.6 m)   Body mass index is 24.36 kg/m. Physical Exam Vitals reviewed.  Constitutional:      General: She is not in acute distress.    Appearance: Normal appearance. She is not toxic-appearing.  HENT:     Head: Normocephalic and atraumatic.  Cardiovascular:     Rate and Rhythm: Normal rate and regular rhythm.     Pulses: Normal pulses.     Heart sounds: Normal heart sounds.  Pulmonary:     Effort: Pulmonary effort is normal.     Breath sounds: Normal breath sounds. No wheezing, rhonchi or rales.  Abdominal:     General: Bowel sounds are normal.     Palpations: Abdomen is soft.     Comments: Abdomen quiet after lunch today  Musculoskeletal:     Right lower leg: No edema.     Left lower leg: No edema.     Comments: Mild right shoulder tenderness and slight limitation to ROM  Neurological:     Mental Status: She is alert. Mental status is at baseline.     Comments: Brought in wheelchair due to long distance but ambulates with walker  Psychiatric:        Mood and Affect: Mood normal.     Labs reviewed: Basic Metabolic Panel: Recent Labs    04/28/19 0000 04/28/19 0600 05/07/19 0937 07/13/19 0000  NA  --  137  --   --   K  --  4.4  --   --   CL  --  107  --   --   CO2  --  19  --   --   BUN  --  34*  --   --   CREATININE  --  1.5*  --   --   CALCIUM  8.7  --   --   --   TSH  --   --  0.208* 0.94   Liver Function Tests: Recent Labs    04/28/19 0000  ALBUMIN 3.2*   No results for input(s): LIPASE, AMYLASE in the last 8760 hours. No results for input(s): AMMONIA in the last 8760 hours. CBC: Recent Labs    04/28/19 0000 04/28/19 0600  WBC 7.2 7.2  HGB 10.0* 10.0*  HCT 30* 30*  PLT 237 237   Lipid Panel: Recent Labs    04/28/19 0000 04/28/19 0600  CHOL 137 137  HDL 41 41  LDLCALC 74 74  TRIG 110 110   Lab Results  Component Value Date   HGBA1C 5.3 08/02/2013    Procedures since last visit: CUP PACEART REMOTE DEVICE CHECK  Result Date: 10/12/2019 [facility] summary report received. Battery status OK. Normal device function. No new symptom episodes, tachy episodes, brady, or pause episodes. No new AF episodes. Monthly summary reports and ROV/PRN Kathy Breach, RN, CCDS, [employer]   Assessment/Plan 1. Expressive aphasia -continues to struggle with this some  2. Hemiparesis of right dominant side as late effect of nontraumatic intracerebral hemorrhage (HCC) -cont secondary preventative meds with statin, off eliquis due to hemorrhage  3. Shoulder stiffness, right -cont prn muscle relaxer, c/o occasionally  4. Stage 3a chronic kidney disease -f/u CMP -Avoid nephrotoxic agents like nsaids, dose adjust renally excreted meds, hydrate.  5. Microscopic colitis, unspecified microscopic colitis type -less problematic recently (has occasional loose stool) -not on active tx  6. Essential hypertension -bp at goal with current therapy, cont same regimen and monitor  7. Paroxysmal atrial fibrillation (HCC) -NSR today on exam, cont amiodarone, baby asa, coreg for rate control  8. Hypothyroidism, unspecified type -TSH at goal in June, cont same levothyroxine  MOST form unchanged when reviewed briefly today with pt and son, [name].  Most up to date form is in vynca.  Labs/tests ordered:  Cbc, cmp next draw in  [place] Next appt:  6 mos in [place]. Shavonn Convey, D.O. [facility] [address], [place] [address] Cell Phone (Mon-Fri 8am-5pm):  [phone] On Call:  [phone] & follow prompts after 5pm & weekends Office Phone:  [phone] Office Fax:  [phone]

## 2019-10-21 DIAGNOSIS — D509 Iron deficiency anemia, unspecified: Secondary | ICD-10-CM | POA: Diagnosis not present

## 2019-10-21 DIAGNOSIS — E781 Pure hyperglyceridemia: Secondary | ICD-10-CM | POA: Diagnosis not present

## 2019-10-21 DIAGNOSIS — E039 Hypothyroidism, unspecified: Secondary | ICD-10-CM | POA: Diagnosis not present

## 2019-10-21 DIAGNOSIS — E860 Dehydration: Secondary | ICD-10-CM | POA: Diagnosis not present

## 2019-10-21 LAB — COMPREHENSIVE METABOLIC PANEL
Albumin: 3.8 (ref 3.5–5.0)
Calcium: 9.4 (ref 8.7–10.7)

## 2019-10-21 LAB — BASIC METABOLIC PANEL
BUN: 26 — AB (ref 4–21)
CO2: 21 (ref 13–22)
Chloride: 103 (ref 99–108)
Creatinine: 1.5 — AB (ref 0.5–1.1)
Glucose: 126
Potassium: 4.4 (ref 3.4–5.3)
Sodium: 138 (ref 137–147)

## 2019-10-21 LAB — CBC AND DIFFERENTIAL
HCT: 35 — AB (ref 36–46)
Hemoglobin: 11.4 — AB (ref 12.0–16.0)
Platelets: 324 (ref 150–399)
WBC: 5.8

## 2019-10-21 LAB — HEPATIC FUNCTION PANEL
ALT: 10 (ref 7–35)
AST: 15 (ref 13–35)

## 2019-10-21 LAB — CBC: RBC: 3.89 (ref 3.87–5.11)

## 2019-11-05 ENCOUNTER — Telehealth: Payer: Self-pay

## 2019-11-05 ENCOUNTER — Encounter: Payer: Self-pay | Admitting: Internal Medicine

## 2019-11-05 NOTE — Telephone Encounter (Signed)
Per Dr. Mariea Clonts. Mild Anemia. Electrolytes and kidney ok  Slight elevation of 1 liver test. I called and left a message for [name] to call back for the lab results.

## 2019-11-15 ENCOUNTER — Ambulatory Visit (INDEPENDENT_AMBULATORY_CARE_PROVIDER_SITE_OTHER): Payer: Medicare Other

## 2019-11-15 DIAGNOSIS — I639 Cerebral infarction, unspecified: Secondary | ICD-10-CM | POA: Diagnosis not present

## 2019-11-15 LAB — CUP PACEART REMOTE DEVICE CHECK
Date Time Interrogation Session: 20210929233223
Implantable Pulse Generator Implant Date: 20190903

## 2019-11-17 NOTE — Progress Notes (Signed)
Carelink Summary Report / Loop Recorder 

## 2019-11-20 ENCOUNTER — Encounter: Payer: Self-pay | Admitting: Internal Medicine

## 2019-11-20 DIAGNOSIS — M25559 Pain in unspecified hip: Secondary | ICD-10-CM | POA: Diagnosis not present

## 2019-11-22 ENCOUNTER — Encounter (HOSPITAL_BASED_OUTPATIENT_CLINIC_OR_DEPARTMENT_OTHER): Payer: Self-pay | Admitting: Emergency Medicine

## 2019-11-22 ENCOUNTER — Emergency Department (HOSPITAL_BASED_OUTPATIENT_CLINIC_OR_DEPARTMENT_OTHER): Payer: Medicare Other

## 2019-11-22 ENCOUNTER — Encounter: Payer: Self-pay | Admitting: Adult Health

## 2019-11-22 ENCOUNTER — Inpatient Hospital Stay (HOSPITAL_BASED_OUTPATIENT_CLINIC_OR_DEPARTMENT_OTHER)
Admission: EM | Admit: 2019-11-22 | Discharge: 2019-11-29 | DRG: 564 | Disposition: A | Discharge status: Skilled Nursing Facility | Payer: Medicare Other | Source: Skilled Nursing Facility | Attending: Internal Medicine | Admitting: Internal Medicine

## 2019-11-22 ENCOUNTER — Other Ambulatory Visit: Payer: Self-pay

## 2019-11-22 ENCOUNTER — Non-Acute Institutional Stay: Payer: Medicare Other | Admitting: Adult Health

## 2019-11-22 DIAGNOSIS — Z20822 Contact with and (suspected) exposure to covid-19: Secondary | ICD-10-CM | POA: Diagnosis present

## 2019-11-22 DIAGNOSIS — I130 Hypertensive heart and chronic kidney disease with heart failure and stage 1 through stage 4 chronic kidney disease, or unspecified chronic kidney disease: Secondary | ICD-10-CM | POA: Diagnosis present

## 2019-11-22 DIAGNOSIS — R9082 White matter disease, unspecified: Secondary | ICD-10-CM | POA: Diagnosis not present

## 2019-11-22 DIAGNOSIS — K449 Diaphragmatic hernia without obstruction or gangrene: Secondary | ICD-10-CM | POA: Diagnosis not present

## 2019-11-22 DIAGNOSIS — M25511 Pain in right shoulder: Secondary | ICD-10-CM

## 2019-11-22 DIAGNOSIS — Z86711 Personal history of pulmonary embolism: Secondary | ICD-10-CM

## 2019-11-22 DIAGNOSIS — I69398 Other sequelae of cerebral infarction: Secondary | ICD-10-CM

## 2019-11-22 DIAGNOSIS — Z8719 Personal history of other diseases of the digestive system: Secondary | ICD-10-CM

## 2019-11-22 DIAGNOSIS — W19XXXA Unspecified fall, initial encounter: Secondary | ICD-10-CM | POA: Diagnosis not present

## 2019-11-22 DIAGNOSIS — J90 Pleural effusion, not elsewhere classified: Secondary | ICD-10-CM | POA: Diagnosis not present

## 2019-11-22 DIAGNOSIS — Z87891 Personal history of nicotine dependence: Secondary | ICD-10-CM

## 2019-11-22 DIAGNOSIS — N390 Urinary tract infection, site not specified: Secondary | ICD-10-CM | POA: Diagnosis present

## 2019-11-22 DIAGNOSIS — I69320 Aphasia following cerebral infarction: Secondary | ICD-10-CM

## 2019-11-22 DIAGNOSIS — I82409 Acute embolism and thrombosis of unspecified deep veins of unspecified lower extremity: Secondary | ICD-10-CM | POA: Diagnosis present

## 2019-11-22 DIAGNOSIS — S7001XA Contusion of right hip, initial encounter: Secondary | ICD-10-CM | POA: Diagnosis not present

## 2019-11-22 DIAGNOSIS — R52 Pain, unspecified: Secondary | ICD-10-CM | POA: Diagnosis not present

## 2019-11-22 DIAGNOSIS — Z96652 Presence of left artificial knee joint: Secondary | ICD-10-CM | POA: Diagnosis present

## 2019-11-22 DIAGNOSIS — R197 Diarrhea, unspecified: Secondary | ICD-10-CM | POA: Diagnosis present

## 2019-11-22 DIAGNOSIS — S42101A Fracture of unspecified part of scapula, right shoulder, initial encounter for closed fracture: Secondary | ICD-10-CM

## 2019-11-22 DIAGNOSIS — Z66 Do not resuscitate: Secondary | ICD-10-CM | POA: Diagnosis present

## 2019-11-22 DIAGNOSIS — S32511A Fracture of superior rim of right pubis, initial encounter for closed fracture: Secondary | ICD-10-CM | POA: Diagnosis not present

## 2019-11-22 DIAGNOSIS — Z79890 Hormone replacement therapy (postmenopausal): Secondary | ICD-10-CM

## 2019-11-22 DIAGNOSIS — B9620 Unspecified Escherichia coli [E. coli] as the cause of diseases classified elsewhere: Secondary | ICD-10-CM | POA: Diagnosis present

## 2019-11-22 DIAGNOSIS — G9341 Metabolic encephalopathy: Secondary | ICD-10-CM | POA: Diagnosis present

## 2019-11-22 DIAGNOSIS — J0130 Acute sphenoidal sinusitis, unspecified: Secondary | ICD-10-CM | POA: Diagnosis not present

## 2019-11-22 DIAGNOSIS — M79651 Pain in right thigh: Secondary | ICD-10-CM | POA: Diagnosis not present

## 2019-11-22 DIAGNOSIS — I4819 Other persistent atrial fibrillation: Secondary | ICD-10-CM | POA: Diagnosis present

## 2019-11-22 DIAGNOSIS — E039 Hypothyroidism, unspecified: Secondary | ICD-10-CM | POA: Diagnosis present

## 2019-11-22 DIAGNOSIS — I69331 Monoplegia of upper limb following cerebral infarction affecting right dominant side: Secondary | ICD-10-CM

## 2019-11-22 DIAGNOSIS — Z79899 Other long term (current) drug therapy: Secondary | ICD-10-CM

## 2019-11-22 DIAGNOSIS — N858 Other specified noninflammatory disorders of uterus: Secondary | ICD-10-CM | POA: Diagnosis present

## 2019-11-22 DIAGNOSIS — M79604 Pain in right leg: Secondary | ICD-10-CM | POA: Diagnosis not present

## 2019-11-22 DIAGNOSIS — S32591A Other specified fracture of right pubis, initial encounter for closed fracture: Secondary | ICD-10-CM | POA: Diagnosis not present

## 2019-11-22 DIAGNOSIS — S42111A Displaced fracture of body of scapula, right shoulder, initial encounter for closed fracture: Secondary | ICD-10-CM | POA: Diagnosis not present

## 2019-11-22 DIAGNOSIS — S32010A Wedge compression fracture of first lumbar vertebra, initial encounter for closed fracture: Secondary | ICD-10-CM | POA: Diagnosis not present

## 2019-11-22 DIAGNOSIS — J189 Pneumonia, unspecified organism: Secondary | ICD-10-CM | POA: Diagnosis present

## 2019-11-22 DIAGNOSIS — I4891 Unspecified atrial fibrillation: Secondary | ICD-10-CM | POA: Diagnosis present

## 2019-11-22 DIAGNOSIS — E876 Hypokalemia: Secondary | ICD-10-CM | POA: Diagnosis not present

## 2019-11-22 DIAGNOSIS — I959 Hypotension, unspecified: Secondary | ICD-10-CM | POA: Diagnosis not present

## 2019-11-22 DIAGNOSIS — S32512A Fracture of superior rim of left pubis, initial encounter for closed fracture: Secondary | ICD-10-CM | POA: Diagnosis present

## 2019-11-22 DIAGNOSIS — M4856XA Collapsed vertebra, not elsewhere classified, lumbar region, initial encounter for fracture: Secondary | ICD-10-CM | POA: Diagnosis present

## 2019-11-22 DIAGNOSIS — M25421 Effusion, right elbow: Secondary | ICD-10-CM | POA: Diagnosis not present

## 2019-11-22 DIAGNOSIS — I10 Essential (primary) hypertension: Secondary | ICD-10-CM | POA: Diagnosis not present

## 2019-11-22 DIAGNOSIS — Z86718 Personal history of other venous thrombosis and embolism: Secondary | ICD-10-CM

## 2019-11-22 DIAGNOSIS — K5190 Ulcerative colitis, unspecified, without complications: Secondary | ICD-10-CM | POA: Diagnosis present

## 2019-11-22 DIAGNOSIS — Z8249 Family history of ischemic heart disease and other diseases of the circulatory system: Secondary | ICD-10-CM

## 2019-11-22 DIAGNOSIS — Y92099 Unspecified place in other non-institutional residence as the place of occurrence of the external cause: Secondary | ICD-10-CM

## 2019-11-22 DIAGNOSIS — M75100 Unspecified rotator cuff tear or rupture of unspecified shoulder, not specified as traumatic: Secondary | ICD-10-CM | POA: Diagnosis present

## 2019-11-22 DIAGNOSIS — N1832 Chronic kidney disease, stage 3b: Secondary | ICD-10-CM | POA: Diagnosis present

## 2019-11-22 DIAGNOSIS — M25561 Pain in right knee: Secondary | ICD-10-CM | POA: Diagnosis not present

## 2019-11-22 DIAGNOSIS — K219 Gastro-esophageal reflux disease without esophagitis: Secondary | ICD-10-CM | POA: Diagnosis present

## 2019-11-22 DIAGNOSIS — W010XXA Fall on same level from slipping, tripping and stumbling without subsequent striking against object, initial encounter: Secondary | ICD-10-CM | POA: Diagnosis present

## 2019-11-22 DIAGNOSIS — S42114A Nondisplaced fracture of body of scapula, right shoulder, initial encounter for closed fracture: Secondary | ICD-10-CM | POA: Diagnosis not present

## 2019-11-22 DIAGNOSIS — M25552 Pain in left hip: Secondary | ICD-10-CM | POA: Diagnosis not present

## 2019-11-22 DIAGNOSIS — R0902 Hypoxemia: Secondary | ICD-10-CM | POA: Diagnosis not present

## 2019-11-22 DIAGNOSIS — Z823 Family history of stroke: Secondary | ICD-10-CM

## 2019-11-22 DIAGNOSIS — R404 Transient alteration of awareness: Secondary | ICD-10-CM | POA: Diagnosis not present

## 2019-11-22 DIAGNOSIS — K5792 Diverticulitis of intestine, part unspecified, without perforation or abscess without bleeding: Secondary | ICD-10-CM | POA: Diagnosis present

## 2019-11-22 DIAGNOSIS — J323 Chronic sphenoidal sinusitis: Secondary | ICD-10-CM | POA: Diagnosis not present

## 2019-11-22 DIAGNOSIS — M25571 Pain in right ankle and joints of right foot: Secondary | ICD-10-CM | POA: Diagnosis not present

## 2019-11-22 DIAGNOSIS — I2699 Other pulmonary embolism without acute cor pulmonale: Secondary | ICD-10-CM | POA: Diagnosis present

## 2019-11-22 DIAGNOSIS — M545 Low back pain, unspecified: Secondary | ICD-10-CM | POA: Diagnosis not present

## 2019-11-22 DIAGNOSIS — J322 Chronic ethmoidal sinusitis: Secondary | ICD-10-CM | POA: Diagnosis not present

## 2019-11-22 DIAGNOSIS — E785 Hyperlipidemia, unspecified: Secondary | ICD-10-CM | POA: Diagnosis present

## 2019-11-22 DIAGNOSIS — S42131A Displaced fracture of coracoid process, right shoulder, initial encounter for closed fracture: Secondary | ICD-10-CM | POA: Diagnosis present

## 2019-11-22 DIAGNOSIS — M19011 Primary osteoarthritis, right shoulder: Secondary | ICD-10-CM | POA: Diagnosis not present

## 2019-11-22 DIAGNOSIS — S32592A Other specified fracture of left pubis, initial encounter for closed fracture: Secondary | ICD-10-CM

## 2019-11-22 DIAGNOSIS — I5032 Chronic diastolic (congestive) heart failure: Secondary | ICD-10-CM | POA: Diagnosis present

## 2019-11-22 DIAGNOSIS — J9811 Atelectasis: Secondary | ICD-10-CM | POA: Diagnosis not present

## 2019-11-22 DIAGNOSIS — R54 Age-related physical debility: Secondary | ICD-10-CM | POA: Diagnosis present

## 2019-11-22 DIAGNOSIS — Z7982 Long term (current) use of aspirin: Secondary | ICD-10-CM

## 2019-11-22 LAB — COMPREHENSIVE METABOLIC PANEL
ALT: 15 U/L (ref 0–44)
AST: 21 U/L (ref 15–41)
Albumin: 3.1 g/dL — ABNORMAL LOW (ref 3.5–5.0)
Alkaline Phosphatase: 117 U/L (ref 38–126)
Anion gap: 9 (ref 5–15)
BUN: 33 mg/dL — ABNORMAL HIGH (ref 8–23)
CO2: 24 mmol/L (ref 22–32)
Calcium: 8.5 mg/dL — ABNORMAL LOW (ref 8.9–10.3)
Chloride: 103 mmol/L (ref 98–111)
Creatinine, Ser: 1.69 mg/dL — ABNORMAL HIGH (ref 0.44–1.00)
GFR, Estimated: 27 mL/min — ABNORMAL LOW (ref >60)
Glucose, Bld: 117 mg/dL — ABNORMAL HIGH (ref 70–99)
Potassium: 4.9 mmol/L (ref 3.5–5.1)
Sodium: 136 mmol/L (ref 135–145)
Total Bilirubin: 0.3 mg/dL (ref 0.3–1.2)
Total Protein: 6.9 g/dL (ref 6.5–8.1)

## 2019-11-22 LAB — CBC WITH DIFFERENTIAL/PLATELET
Abs Immature Granulocytes: 0.09 10*3/uL — ABNORMAL HIGH (ref 0.00–0.07)
Basophils Absolute: 0 10*3/uL (ref 0.0–0.1)
Basophils Relative: 0 %
Eosinophils Absolute: 0.1 10*3/uL (ref 0.0–0.5)
Eosinophils Relative: 1 %
HCT: 33.8 % — ABNORMAL LOW (ref 36.0–46.0)
Hemoglobin: 10.8 g/dL — ABNORMAL LOW (ref 12.0–15.0)
Immature Granulocytes: 1 %
Lymphocytes Relative: 5 %
Lymphs Abs: 0.8 10*3/uL (ref 0.7–4.0)
MCH: 29.3 pg (ref 26.0–34.0)
MCHC: 32 g/dL (ref 30.0–36.0)
MCV: 91.8 fL (ref 80.0–100.0)
Monocytes Absolute: 1 10*3/uL (ref 0.1–1.0)
Monocytes Relative: 7 %
Neutro Abs: 12.6 10*3/uL — ABNORMAL HIGH (ref 1.7–7.7)
Neutrophils Relative %: 86 %
Platelets: 223 10*3/uL (ref 150–400)
RBC: 3.68 MIL/uL — ABNORMAL LOW (ref 3.87–5.11)
RDW: 13.9 % (ref 11.5–15.5)
WBC: 14.5 10*3/uL — ABNORMAL HIGH (ref 4.0–10.5)
nRBC: 0 % (ref 0.0–0.2)

## 2019-11-22 LAB — URINALYSIS, ROUTINE W REFLEX MICROSCOPIC
Bilirubin Urine: NEGATIVE
Glucose, UA: NEGATIVE mg/dL
Ketones, ur: NEGATIVE mg/dL
Nitrite: POSITIVE — AB
Protein, ur: 30 mg/dL — AB
Specific Gravity, Urine: 1.02 (ref 1.005–1.030)
pH: 6 (ref 5.0–8.0)

## 2019-11-22 LAB — RESPIRATORY PANEL BY RT PCR (FLU A&B, COVID)
Influenza A by PCR: NEGATIVE
Influenza B by PCR: NEGATIVE
SARS Coronavirus 2 by RT PCR: NEGATIVE

## 2019-11-22 LAB — CK: Total CK: 32 U/L — ABNORMAL LOW (ref 38–234)

## 2019-11-22 LAB — URINALYSIS, MICROSCOPIC (REFLEX): WBC, UA: >50 WBC/hpf (ref 0–5)

## 2019-11-22 MED ORDER — OXYCODONE-ACETAMINOPHEN 5-325 MG PO TABS: 0.5000 | ORAL_TABLET | ORAL | Status: DC | PRN

## 2019-11-22 MED ORDER — OXYCODONE HCL 5 MG PO TABS
5.0000 mg | ORAL_TABLET | Freq: Once | ORAL | Status: AC
  Administered 2019-11-22: 5 mg via ORAL
  Filled 2019-11-22: qty 1

## 2019-11-22 MED ORDER — MELATONIN 5 MG PO TABS
10.0000 mg | ORAL_TABLET | Freq: Every evening | ORAL | Status: DC | PRN
  Administered 2019-11-28: 10 mg via ORAL
  Filled 2019-11-22 (×2): qty 2

## 2019-11-22 MED ORDER — OXYCODONE-ACETAMINOPHEN 5-325 MG PO TABS
1.0000 | ORAL_TABLET | ORAL | Status: DC | PRN
  Administered 2019-11-23: 1 via ORAL
  Filled 2019-11-22: qty 1

## 2019-11-22 MED ORDER — METRONIDAZOLE IN NACL 5-0.79 MG/ML-% IV SOLN
500.0000 mg | Freq: Three times a day (TID) | INTRAVENOUS | Status: AC
  Administered 2019-11-22 – 2019-11-26 (×13): 500 mg via INTRAVENOUS
  Filled 2019-11-22 (×13): qty 100

## 2019-11-22 MED ORDER — ONDANSETRON HCL 4 MG/2ML IJ SOLN
4.0000 mg | Freq: Four times a day (QID) | INTRAMUSCULAR | Status: DC | PRN
  Filled 2019-11-22: qty 2

## 2019-11-22 MED ORDER — SODIUM CHLORIDE 0.9 % IV SOLN
2.0000 g | INTRAVENOUS | Status: AC
  Administered 2019-11-22 – 2019-11-26 (×5): 2 g via INTRAVENOUS
  Filled 2019-11-22 (×5): qty 20

## 2019-11-22 MED ORDER — ACETAMINOPHEN 325 MG PO TABS
650.0000 mg | ORAL_TABLET | ORAL | Status: DC | PRN
  Administered 2019-11-22: 650 mg via ORAL
  Filled 2019-11-22: qty 2

## 2019-11-22 NOTE — ED Notes (Addendum)
Attempted to apply sling but PT insited on waiting. PT family member encouraged pt to try sling but PT felt she needed to stay and be admitted RN aware

## 2019-11-22 NOTE — ED Notes (Signed)
Patient transported to CT via transport

## 2019-11-22 NOTE — ED Notes (Signed)
Pt back in room from CT 

## 2019-11-22 NOTE — ED Notes (Signed)
Patient transported to X-ray 

## 2019-11-22 NOTE — ED Provider Notes (Signed)
Medical screening examination/treatment/procedure(s) were conducted as a shared visit with non-physician practitioner(s) and myself.  I personally evaluated the patient during the encounter. Briefly, the patient is a 86 y.o. female who presents to the ED after a fall several days ago.  Patient found to have right scapular fracture, L1 compression fracture, left pubic rami fractures.  Lives at a assisted living facility.  She has not been able to get up and take care of herself for the last several days.  Not sure if she hit her head.  She appears her neurologic baseline.  Overall she is very uncomfortable.  Will likely require significant PT and OT.  Likely these are nonsurgical.  Will touch base with orthopedics.  Will likely need a TLSO brace for compression fracture.  She does have some tenderness in the midline spine but difficult to fully assess due to some stroke/memory issues.  Will admit for PT/OT and pain control.  She will need likely SNF placement.  This chart was dictated using voice recognition software.  Despite best efforts to proofread,  errors can occur which can change the documentation meaning.     EKG Interpretation None           Lennice Sites, DO 11/22/19 1833

## 2019-11-22 NOTE — Progress Notes (Signed)
Location:  Occupational psychologist of Service:  ALF (13) Provider:   Cindi Carbon, ANP [place] [phone]   [name], DO  Patient Care Team: [name], DO as PCP - General (Geriatric Medicine) Benard Rink (Dentistry) [place], [place], [place] (Optometry) Sydnee Levans, MD (Dermatology) Thompson Grayer, MD as Consulting Physician (Cardiology) [name], DO as Consulting Physician (Geriatric Medicine)  Extended Emergency Contact Information Primary Emergency Contact: [name] [place] of [place] Phone: [phone] Mobile Phone: [phone] Relation: Son Secondary Emergency Contact: [name] [place] of [place] Mobile Phone: [phone] Relation: Son  Code Status:  DNR Goals of care: Advanced Directive information Advanced Directives 10/20/2019  Does Patient Have a Medical Advance Directive? Yes  Type of Paramedic of [place];Out of facility DNR (pink MOST or yellow form)  Does patient want to make changes to medical advance directive? No - Patient declined  Copy of [place] in Chart? Yes - validated most recent copy scanned in chart (See row information)  Would patient like information on creating a medical advance directive? -  Pre-existing out of facility DNR order (yellow form or pink MOST form) Pink MOST/Yellow Form most recent copy in chart - Physician notified to receive inpatient order     Chief Complaint  Patient presents with  . Acute Visit    severe pain in the right arm, back and right leg    HPI:  Pt is a 86 y.o. female seen today for an acute visit for a fall with severe right arm pain, low back pain, and right leg pain. She was found on the floor on 10/9 face up.  At the time she was able to get up and walk to the BR with minimal discomfort. Later she had some right and left hip pain and so xrays were ordered which were negative. This  morning 10/11 she could not move her right arm due to pain but had normal facial movements. She also had pain with minimal movement of her right leg and low back pain. She is requiring more assistance in the AL setting as well. Her BP taken by the nurse was 78/42 then later 80/50.  I took it myself in the room and got 142/60.  She did not hit her head during the fall with no LOC. Ms. Agro has expressive aphasia and has difficulty telling the hx of the fall or her symptoms but is clear that she is in severe pain not relieved tylenol.    Past Medical History:  Diagnosis Date  . Abdominal mass   . AKI (acute kidney injury) ([place]) 09/15/2014  . Atrial fibrillation ([place])   . Colitis, ulcerative ([place])   . Cough    WHITE SPUTUM FOR 2 WEEKS, NO FEVER  . Degenerative joint disease   . Diverticulosis   . DVT (deep venous thrombosis) ([place]) 2009   BOTH LUNGS  . GERD (gastroesophageal reflux disease)   . Hyperlipidemia   . Hyperplastic colon polyp   . Hypertension   . Hypothyroidism   . Iron deficiency anemia    ON IRON AT TIMES  . PE (pulmonary embolism) 2009  . Persistent atrial fibrillation ([place])   . Positive H. pylori test 01/20/96  . S/P dilatation of esophageal stricture 1992  . Skin cancer    h/o basal and squamous skin cancer--> removed  . Stroke [facility])    Past Surgical History:  Procedure Laterality Date  .  APPENDECTOMY    . BREAST EXCISIONAL BIOPSY Left 1992   benign  . BREAST LUMPECTOMY     benign  . DILATION AND CURETTAGE OF UTERUS     for endometrial polyps  . ENDOSCOPIC RETROGRADE CHOLANGIOPANCREATOGRAPHY (ERCP) WITH PROPOFOL N/A 03/23/2015   Procedure: ENDOSCOPIC RETROGRADE CHOLANGIOPANCREATOGRAPHY (ERCP) WITH PROPOFOL;  Surgeon: Milus Banister, MD;  Location: [facility] ENDOS[facility];  Service: Endoscopy;  Laterality: N/A;  . JOINT REPLACEMENT    . LOOP RECORDER INSERTION N/A 10/14/2017   Procedure: LOOP RECORDER INSERTION;  Surgeon: Thompson Grayer, MD;  Location: [facility];   Service: Cardiovascular;  Laterality: N/A;  . ORIF ANKLE FRACTURE Left    X 2  . REPLACEMENT TOTAL KNEE     left  . SURGERY DONE TO REMOVE LEFT ANKLE HARDWARE LATER    . TONSILLECTOMY      Allergies  Allergen Reactions  . Iohexol Hives  . Ivp Dye [Iodinated Diagnostic Agents] Hives    Was able to tolerate Contrast Media for CT scan.  [name] [Shellfish Allergy] Nausea And Vomiting    Projectile vomiting  . Shellfish-Derived Products Nausea And Vomiting  . Sulfa Antibiotics Hives  . Sulfamethoxazole Hives    Outpatient Encounter Medications as of 11/22/2019  Medication Sig  . acetaminophen (TYLENOL) 500 MG tablet Take 1,000 mg by mouth 2 (two) times daily.   Marland Kitchen amiodarone (PACERONE) 100 MG tablet Take 1 tablet (100 mg total) by mouth daily.  Marland Kitchen aspirin 81 MG tablet Take 1 tablet (81 mg total) by mouth daily.  Marland Kitchen atorvastatin (LIPITOR) 10 MG tablet Take 10 mg by mouth daily.  . Ca Phosphate-Cholecalciferol (CALTRATE GUMMY BITES PO) Take 1 tablet by mouth 2 (two) times daily.  . carvedilol (COREG) 6.25 MG tablet Take 12.5 mg by mouth 2 (two) times daily with a meal.  . docusate sodium (COLACE) 100 MG capsule Take 100 mg by mouth every other day.  . Hypromellose (GONIOVISC) 2 % SOLN Place 1 drop into both eyes daily as needed.  Marland Kitchen ipratropium-albuterol (DUONEB) 0.5-2.5 (3) MG/3ML SOLN Take 3 mLs by nebulization every 6 (six) hours as needed.  Marland Kitchen levothyroxine (SYNTHROID) 75 MCG tablet Take 1 tablet (75 mcg total) by mouth daily before breakfast.  . Melatonin 5 MG TABS Take 5 mg by mouth at bedtime.  . Menthol-Methyl Salicylate (ICY HOT EXTRA STRENGTH) 10-30 % CREA Apply topically 2 (two) times daily.  . ondansetron (ZOFRAN) 4 MG tablet Take 4 mg by mouth every 6 (six) hours as needed for nausea or vomiting.  . pantoprazole (PROTONIX) 40 MG tablet Take 40 mg by mouth daily.  . polyethylene glycol (MIRALAX / GLYCOLAX) 17 g packet Take 17 g by mouth daily as needed. Hold if loose stools  occur  . potassium chloride 20 MEQ/15ML (10%) SOLN Take 7.5 mLs (10 mEq total) by mouth daily.  . Simethicone (GAS RELIEF PO) Take 1 tablet by mouth 2 (two) times daily.  . sodium chloride 1 g tablet Take 1 g by mouth 2 (two) times daily with a meal.  . tiZANidine (ZANAFLEX) 2 MG tablet Take 1 tablet (2 mg total) by mouth 4 (four) times daily -  with meals and at bedtime.  . WITCH HAZEL EX Apply 1 application topically 2 (two) times daily as needed.    No facility-administered encounter medications on file as of 11/22/2019.    Review of Systems  Constitutional: Positive for activity change. Negative for appetite change, chills, diaphoresis, fatigue and fever.  HENT:  Negative for congestion.   Respiratory: Negative for cough, shortness of breath and wheezing.   Cardiovascular: Negative for chest pain, palpitations and leg swelling.  Gastrointestinal: Negative for abdominal distention, constipation and diarrhea.  Musculoskeletal: Positive for back pain and gait problem.       Chronic right shoulder pain  Skin: Negative for wound.  Neurological: Positive for dizziness (resolved) and speech difficulty. Negative for tremors, seizures, syncope, facial asymmetry, weakness, light-headedness and headaches.  Psychiatric/Behavioral: Positive for confusion. Negative for agitation and behavioral problems.    Immunization History  Administered Date(s) Administered  . H1N1 03/04/2008  . Influenza Split 11/12/2010, 11/26/2011  . Influenza Whole 11/26/2006, 11/03/2007, 12/10/2007, 11/16/2008, 11/20/2009  . Influenza, High Dose Seasonal PF 12/10/2018  . Influenza,inj,Quad PF,6+ Mos 11/24/2012, 11/29/2013, 10/14/2014, 11/28/2015, 12/04/2016, 12/11/2017  . Moderna SARS-COVID-2 Vaccination 02/20/2019, 03/24/2019  . PPD Test 10/03/2014  . Pneumococcal Conjugate-13 09/10/2013  . Pneumococcal Polysaccharide-23 02/12/2000  . Td 02/11/1998, 04/13/2008  . Zoster 05/08/2006   Pertinent  Health Maintenance  Due  Topic Date Due  . INFLUENZA VACCINE  09/12/2019  . DEXA SCAN  Completed  . PNA vac Low Risk Adult  Completed   Fall Risk  10/20/2019 04/14/2019 09/16/2018 03/18/2018 11/05/2017  Falls in the past year? 0 0 0 0 No  Number falls in past yr: 0 0 0 0 -  Comment - - - - -  Injury with Fall? 0 0 0 0 -  Risk for fall due to : History of fall(s);Impaired balance/gait;Impaired mobility;Mental status change - - - -  Follow up Falls evaluation completed;Education provided;Falls prevention discussed - - - -   Functional Status Survey:    Vitals:   11/22/19 1246  BP: (!) 78/42  Pulse: 65  Resp: (!) 22  Temp: (!) 97.5 F (36.4 C)  SpO2: 95%   There is no height or weight on file to calculate BMI. Physical Exam Vitals and nursing note reviewed.  Constitutional:      General: She is not in acute distress.    Appearance: She is not diaphoretic.  HENT:     Head: Normocephalic and atraumatic.     Mouth/Throat:     Mouth: Mucous membranes are moist.     Pharynx: Oropharynx is clear.  Neck:     Vascular: No JVD.  Cardiovascular:     Rate and Rhythm: Normal rate and regular rhythm.     Heart sounds: No murmur heard.   Pulmonary:     Effort: Pulmonary effort is normal. No respiratory distress.     Breath sounds: Normal breath sounds. No wheezing.  Musculoskeletal:        General: Tenderness and signs of injury present. No swelling or deformity.     Right shoulder: Tenderness (AC joint, also tender at the elbow. ) and bony tenderness present. No swelling or crepitus. Decreased range of motion. Decreased strength. Normal pulse.     Left shoulder: Normal.     Cervical back: Normal.     Thoracic back: Normal.     Lumbar back: Tenderness present. Positive right straight leg raise test and positive left straight leg raise test.     Right lower leg: No edema.     Left lower leg: No edema.     Comments: She is guarded and refuses range of motion due to pain of either leg. Lifting the right leg  2  inches off the ground caused severe pain  Skin:    General: Skin is warm and dry.  Neurological:     General: No focal deficit present.     Mental Status: She is alert and oriented to person, place, and time. Mental status is at baseline.     Cranial Nerves: No cranial nerve deficit.     Labs reviewed: Recent Labs    04/28/19 0000 04/28/19 0600 10/21/19 0000  NA  --  137 138  K  --  4.4 4.4  CL  --  107 103  CO2  --  19 21  BUN  --  34* 26*  CREATININE  --  1.5* 1.5*  CALCIUM 8.7  --  9.4   Recent Labs    04/28/19 0000 10/21/19 0000  AST  --  15  ALT  --  10  ALBUMIN 3.2* 3.8   Recent Labs    04/28/19 0000 04/28/19 0600 10/21/19 0000  WBC 7.2 7.2 5.8  HGB 10.0* 10.0* 11.4*  HCT 30* 30* 35*  PLT 237 237 324   Lab Results  Component Value Date   TSH 0.94 07/13/2019   Lab Results  Component Value Date   HGBA1C 5.3 08/02/2013   Lab Results  Component Value Date   CHOL 137 04/28/2019   HDL 41 04/28/2019   LDLCALC 74 04/28/2019   LDLDIRECT 85 08/18/2008   TRIG 110 04/28/2019   CHOLHDL 6.8 09/16/2014    Significant Diagnostic Results in last 30 days:  CUP PACEART REMOTE DEVICE CHECK  Result Date: 11/15/2019 ILR summary report received. Battery status OK. Normal device function. No new symptom episodes, tachy episodes, brady, or pause episodes. No new AF episodes. Monthly summary reports and ROV/PRN JM   Assessment/Plan 1. Fall, initial encounter Unclear etiology but was likely mechanical in nature due to gait instability.  2. Severe pain of right shoulder She has a hx of chronic right shoulder pain but now has severe pain and will not move her arm and use it in anyway. She would not allow me to remove her sweater for examination due to the pain. Needs additional imaging.   3. Right leg pain Right hip xray neg on 10/9 but pt has continued pain and now trouble bearing weight and needs increased assistance. Needs additional imaging.   4. Low back  pain.  Due to the severity of her pain and inability to move I recommend that they use a hoyer and place her in the bed and not allow further movement. I spoke with her son and due to the severity of her pain will give 1 percocet and send to the ER for further evaluation. Her most form indicates no hospitalizations but this particular situation warrants further investigation for comfort.   Family/ staff Communication: discussed with her son [name]

## 2019-11-22 NOTE — ED Provider Notes (Addendum)
[facility] EMERGENCY DEPARTMENT Provider Note   CSN: [id number] Arrival date & time: 11/22/19  1352     History Chief Complaint  Patient presents with  . Fall    Sara Russell is a 86 y.o. female.  Patient with history of patient stroke with residual weakness and aphasia --presents for evaluation after a fall occurring 2 days ago.  History obtained mainly from the patient's son as well as nurse practitioner note in epic.  Level 5 caveat due to speech problems.   Patient reportedly had a mechanical fall 2 mornings ago.  She has had progressively worsening right-sided pain since that time.  She has been unable to use her right arm and unable to bear weight on her right leg.  She had unspecified x-rays which were reportedly negative after the fall.  She was sent here today after reevaluation showed worsening debility.         Past Medical History:  Diagnosis Date  . Abdominal mass   . AKI (acute kidney injury) ([place]) 09/15/2014  . Atrial fibrillation ([place])   . Colitis, ulcerative ([place])   . Cough    WHITE SPUTUM FOR 2 WEEKS, NO FEVER  . Degenerative joint disease   . Diverticulosis   . DVT (deep venous thrombosis) ([place]) 2009   BOTH LUNGS  . GERD (gastroesophageal reflux disease)   . Hyperlipidemia   . Hyperplastic colon polyp   . Hypertension   . Hypothyroidism   . Iron deficiency anemia    ON IRON AT TIMES  . PE (pulmonary embolism) 2009  . Persistent atrial fibrillation ([place])   . Positive H. pylori test 01/20/96  . S/P dilatation of esophageal stricture 1992  . Skin cancer    h/o basal and squamous skin cancer--> removed  . Stroke [facility])     Patient Active Problem List   Diagnosis Date Noted  . Microscopic colitis 09/18/2018  . Hemiparesis of right dominant side as late effect of nontraumatic intracerebral hemorrhage ([place]) 10/02/2017  . Expressive aphasia 09/22/2017  . Urinary retention 08/28/2017  . Essential hypertension   . Labile blood  pressure   . Thalamic hemorrhage ([place]) 08/06/2017  . Hyperlipidemia   . Stage 3 chronic kidney disease ([place])   . At high risk for falls 02/12/2017  . Insomnia 02/12/2017  . Pulmonary nodules/lesions, multiple 01/29/2017  . Hypothyroidism 04/04/2015  . Compression fracture of L1 lumbar vertebra (H[facility]) 02/23/2015  . Cholelithiases 02/23/2015  . Encounter for palliative care   . Chronic diastolic congestive heart failure ([place])   . Osteoporosis 05/13/2014  . Gout 08/16/2013  . Left atrial enlargement 01/10/2013  . A-fib ([place]) 12/08/2012  . Rosacea 05/15/2012  . DEGENERATIVE DISC DISEASE, LUMBAR SPINE 07/14/2009  . GERD 07/11/2009  . PERSONAL HX COLONIC POLYPS 07/11/2009  . HYPERCHOLESTEROLEMIA 04/10/2006  . History of pulmonary embolism 04/10/2006  . DIVERTICULOSIS OF COLON 04/10/2006  . ACTINIC KERATOSIS 04/10/2006  . GEN OSTEOARTHROSIS INVOLVING MULTIPLE SITES 04/10/2006    Past Surgical History:  Procedure Laterality Date  . APPENDECTOMY    . BREAST EXCISIONAL BIOPSY Left 1992   benign  . BREAST LUMPECTOMY     benign  . DILATION AND CURETTAGE OF UTERUS     for endometrial polyps  . ENDOSCOPIC RETROGRADE CHOLANGIOPANCREATOGRAPHY (ERCP) WITH PROPOFOL N/A 03/23/2015   Procedure: ENDOSCOPIC RETROGRADE CHOLANGIOPANCREATOGRAPHY (ERCP) WITH PROPOFOL;  Surgeon: Milus Banister, MD;  Location: [facility] END[facility];  Service: Endoscopy;  Laterality: N/A;  . JOINT REPLACEMENT    .  LOOP RECORDER INSERTION N/A 10/14/2017   Procedure: LOOP RECORDER INSERTION;  Surgeon: Thompson Grayer, MD;  Location: [facility];  Service: Cardiovascular;  Laterality: N/A;  . ORIF ANKLE FRACTURE Left    X 2  . REPLACEMENT TOTAL KNEE     left  . SURGERY DONE TO REMOVE LEFT ANKLE HARDWARE LATER    . TONSILLECTOMY       OB History   No obstetric history on file.     Family History  Problem Relation Age of Onset  . Esophageal cancer Brother   . Diverticulitis Brother   . Heart disease Mother   .  Stroke Mother   . Dementia Mother   . Heart attack Mother   . Heart disease Father   . Atrial fibrillation Father   . Breast cancer Maternal Aunt   . Colon cancer Neg Hx     Social History   Tobacco Use  . Smoking status: Former Smoker    Packs/day: 0.50    Years: 13.00    Pack years: 6.50    Types: Cigarettes    Start date: 02/12/1948    Quit date: 02/11/1961    Years since quitting: 58.8  . Smokeless tobacco: Never Used  . Tobacco comment: Denies use of tobacco since 1963  Vaping Use  . Vaping Use: Never used  Substance Use Topics  . Alcohol use: Yes    Alcohol/week: 5.0 standard drinks    Types: 5 Glasses of wine per week    Comment: WINE FEW TIMES PER WEEK  . Drug use: No    Home Medications Prior to Admission medications   Medication Sig Start Date End Date Taking? Authorizing Provider  acetaminophen (TYLENOL) 500 MG tablet Take 1,000 mg by mouth 2 (two) times daily.     [provider]  amiodarone (PACERONE) 100 MG tablet Take 1 tablet (100 mg total) by mouth daily. 07/29/17   Zenia Resides, MD  aspirin 81 MG tablet Take 1 tablet (81 mg total) by mouth daily. 12/25/17   Frann Rider, NP  atorvastatin (LIPITOR) 10 MG tablet Take 10 mg by mouth daily.    [provider]  Ca Phosphate-Cholecalciferol (CALTRATE GUMMY BITES PO) Take 1 tablet by mouth 2 (two) times daily.    [provider]  carvedilol (COREG) 6.25 MG tablet Take 12.5 mg by mouth 2 (two) times daily with a meal.    [provider]  docusate sodium (COLACE) 100 MG capsule Take 100 mg by mouth every other day.    [provider]  Hypromellose (GONIOVISC) 2 % SOLN Place 1 drop into both eyes daily as needed.    [provider]  ipratropium-albuterol (DUONEB) 0.5-2.5 (3) MG/3ML SOLN Take 3 mLs by nebulization every 6 (six) hours as needed.    [provider]  levothyroxine (SYNTHROID) 75 MCG tablet Take 1 tablet (75 mcg total) by mouth daily  before breakfast. 05/12/19   Reed, Tiffany L, DO  Melatonin 5 MG TABS Take 5 mg by mouth at bedtime.    [provider]  Menthol-Methyl Salicylate (ICY HOT EXTRA STRENGTH) 10-30 % CREA Apply topically 2 (two) times daily.    [provider]  ondansetron (ZOFRAN) 4 MG tablet Take 4 mg by mouth every 6 (six) hours as needed for nausea or vomiting.    [provider]  pantoprazole (PROTONIX) 40 MG tablet Take 40 mg by mouth daily.    [provider]  polyethylene glycol (MIRALAX /  GLYCOLAX) 17 g packet Take 17 g by mouth daily as needed. Hold if loose stools occur    [provider]  potassium chloride 20 MEQ/15ML (10%) SOLN Take 7.5 mLs (10 mEq total) by mouth daily. 08/28/17   Love, Ivan Anchors, PA-C  Simethicone (GAS RELIEF PO) Take 1 tablet by mouth 2 (two) times daily.    [provider]  sodium chloride 1 g tablet Take 1 g by mouth 2 (two) times daily with a meal.    [provider]  tiZANidine (ZANAFLEX) 2 MG tablet Take 1 tablet (2 mg total) by mouth 4 (four) times daily -  with meals and at bedtime. 08/26/17   Love, Ivan Anchors, PA-C  WITCH HAZEL EX Apply 1 application topically 2 (two) times daily as needed.     [provider]    Allergies    Iohexol, Ivp dye [iodinated diagnostic agents], Scallops [shellfish allergy], Shellfish-derived products, Sulfa antibiotics, and Sulfamethoxazole  Review of Systems   Review of Systems  Unable to perform ROS: Patient nonverbal    Physical Exam Updated Vital Signs BP (!) 168/48 (BP Location: Right Arm)   Pulse 71   Temp 98.2 F (36.8 C) (Oral)   Resp 19   SpO2 100%   Physical Exam Vitals and nursing note reviewed.  Constitutional:      General: She is not in acute distress.    Appearance: She is well-developed.  HENT:     Head: Normocephalic and atraumatic.     Right Ear: External ear normal.     Left Ear: External ear normal.     Nose: Nose normal.  Eyes:      Conjunctiva/sclera: Conjunctivae normal.  Cardiovascular:     Rate and Rhythm: Normal rate and regular rhythm.     Heart sounds: No murmur heard.   Pulmonary:     Effort: No respiratory distress.     Breath sounds: No wheezing, rhonchi or rales.  Abdominal:     Palpations: Abdomen is soft.     Tenderness: There is no abdominal tenderness. There is no guarding or rebound.  Musculoskeletal:     Cervical back: Normal range of motion and neck supple.     Right lower leg: No edema.     Left lower leg: No edema.     Comments: Right upper extremity: Patient does not seem to have any difficulty with movement or pain with palpation of the right hand, wrist, forearm.  She grimaces with palpation and movement of the right elbow, right humerus, and right shoulder.  She guards the arm and often will move the extremity with her left arm.  Right lower extremity: Patient grimaces with movement of the right ankle.  Foot appears normal.  Right ankle without swelling.  No tenderness of the right shin or trauma noted.  Patient does report pain with ranging of the right knee and right hip.   Skin:    General: Skin is warm and dry.     Findings: No rash.  Neurological:     General: No focal deficit present.     Mental Status: She is alert. Mental status is at baseline.     Motor: No weakness.  Psychiatric:        Mood and Affect: Mood normal.     ED Results / Procedures / Treatments   Labs (all labs ordered are listed, but only abnormal results are displayed) Labs Reviewed  CBC WITH DIFFERENTIAL/PLATELET - Abnormal; Notable for the following  components:      Result Value   WBC 14.5 (*)    RBC 3.68 (*)    Hemoglobin 10.8 (*)    HCT 33.8 (*)    Neutro Abs 12.6 (*)    Abs Immature Granulocytes 0.09 (*)    All other components within normal limits  COMPREHENSIVE METABOLIC PANEL - Abnormal; Notable for the following components:   Glucose, Bld 117 (*)    BUN 33 (*)    Creatinine, Ser 1.69 (*)     Calcium 8.5 (*)    Albumin 3.1 (*)    GFR, Estimated 27 (*)    All other components within normal limits  RESPIRATORY PANEL BY RT PCR (FLU A&B, COVID)  URINALYSIS, ROUTINE W REFLEX MICROSCOPIC    EKG None  Radiology DG Pelvis 1-2 Views  Result Date: 11/22/2019 CLINICAL DATA:  Recent fall with pelvic pain, initial encounter EXAM: PELVIS - 1-2 VIEW COMPARISON:  08/03/2017 FINDINGS: Pelvic ring is intact. Fragmentation is noted along the superior pubic ramus on the left. No other definitive fracture is seen. No soft tissue abnormality is noted. IMPRESSION: Left superior pubic ramus fracture. No other focal abnormality is noted. Electronically Signed   By: Inez Catalina M.D.   On: 11/22/2019 17:15   DG Shoulder Right  Result Date: 11/22/2019 CLINICAL DATA:  Recent fall with right shoulder pain, initial encounter EXAM: RIGHT SHOULDER - 2+ VIEW COMPARISON:  None. FINDINGS: Degenerative changes of the acromioclavicular joint and glenohumeral joint are seen. The is high-riding articulating with the acromion consistent with chronic rotator cuff injury. Bony density is noted anterior to the humeral head on the Y-view which suggests a coracoid fracture. Additionally fragmentation in the scapular body is noted. IMPRESSION: Changes consistent with scapular fracture in the body as well as at the coracoid process. Cross-sectional imaging may be helpful for further evaluation. Degenerative changes are noted about the shoulder joint with findings of a chronic rotator cuff injury. Electronically Signed   By: Inez Catalina M.D.   On: 11/22/2019 17:00   DG Elbow Complete Right  Result Date: 11/22/2019 CLINICAL DATA:  Fall 2 days ago with elbow pain, initial encounter EXAM: RIGHT ELBOW - COMPLETE 3+ VIEW COMPARISON:  None. FINDINGS: No acute fracture or dislocation is noted. Small joint effusion is noted with elevation of the anterior fat pad. Mild soft tissue swelling is noted posteriorly. IMPRESSION: Small joint  effusion without acute fracture. Mild soft tissue swelling is noted posteriorly. Electronically Signed   By: Inez Catalina M.D.   On: 11/22/2019 17:05   DG Ankle Complete Right  Result Date: 11/22/2019 CLINICAL DATA:  Recent fall with right ankle pain, initial encounter EXAM: RIGHT ANKLE - COMPLETE 3+ VIEW COMPARISON:  None. FINDINGS: Generalized osteopenia is noted. No soft tissue abnormality is seen. No acute fracture or dislocation is noted. Tarsal degenerative changes are seen. IMPRESSION: Chronic changes without acute abnormality. Electronically Signed   By: Inez Catalina M.D.   On: 11/22/2019 17:12   CT Lumbar Spine Wo Contrast  Result Date: 11/22/2019 CLINICAL DATA:  Fall.  Severe back pain EXAM: CT LUMBAR SPINE WITHOUT CONTRAST TECHNIQUE: Multidetector CT imaging of the lumbar spine was performed without intravenous contrast administration. Multiplanar CT image reconstructions were also generated. COMPARISON:  Lumbar spine radiographs 02/23/2015 FINDINGS: Segmentation: Normal Alignment: Moderate levoscoliosis.  Normal sagittal alignment. Vertebrae: Severe compression fracture L1 vertebral body. This has progressed since the prior study in 2017 when there was a mild fracture of the superior endplate of  L1. Mild bony retropulsion of the superior endplate of L1 into the canal causing mild spinal stenosis. No other fracture Paraspinal and other soft tissues: No significant paraspinous edema at the L1 fracture. Atherosclerotic aorta without aneurysm Cholelithiasis Large fluid collection in the left pelvis measuring 4.5 x 7 cm. This appears to be adnexal in location. Disc levels: Multilevel disc and facet degeneration throughout the lumbar spine. Mild spinal stenosis at L1 due to posterior retropulsion of superior endplate. No other areas of significant spinal stenosis. IMPRESSION: 1. Severe compression fracture of L1, with progression since 2017. This could be a superimposed acute fracture or chronic  fracture. Mild spinal stenosis due to retropulsion into the canal. No other fracture. 2. Cholelithiasis 3. 4.5 x 7 mm fluid collection left pelvis likely from the adnexa. Because this lesion is not adequately characterized, prompt [place] is recommended for further evaluation. Note: This recommendation does not apply to premenarchal patients and to those with increased risk (genetic, family history, elevated tumor markers or other high-risk factors) of ovarian cancer. Reference: JACR 2020 Feb; 17(2):248-254 Electronically Signed   By: Franchot Gallo M.D.   On: 11/22/2019 16:17   CT FEMUR RIGHT WO CONTRAST  Result Date: 11/22/2019 CLINICAL DATA:  Severe right thigh pain after fall on Saturday. EXAM: CT OF THE LOWER RIGHT EXTREMITY WITHOUT CONTRAST TECHNIQUE: Multidetector CT imaging of the right lower extremity was performed according to the standard protocol. COMPARISON:  Right femur x-rays dated August 03, 2017. FINDINGS: Bones/Joint/Cartilage Partially visualized acute parasymphyseal fractures of the left superior and inferior pubic rami. Old healed fractures of the right superior and inferior pubic rami. No dislocation. Serpiginous subchondral sclerosis in the medial tibial plateau likely represents a a bone infarct. Mild tricompartmental degenerative changes of the knee. The right hip joint space is preserved. Osteopenia. No joint effusion. Ligaments Ligaments are suboptimally evaluated by CT. Muscles and Tendons Grossly intact. Atrophy of the semimembranosus and biceps femoris muscles. Soft tissue Small amount of ill-defined high-density material in the superficial fat of the right hip along the inferior aspect of the greater trochanter, likely hemorrhage. No soft tissue mass. 6.9 x 5.2 cm simple appearing cystic lesion arising from the left ovary. IMPRESSION: 1. Partially visualized acute parasymphyseal fractures of the left superior and inferior pubic rami. Old healed fractures of the right superior and  inferior pubic rami. 2. Small ill-defined hematoma in the superficial fat of the right hip along the inferior aspect of the greater trochanter. No underlying fracture. 3. 6.9 cm cystic lesion arising from the left ovary. Because this lesion is not adequately characterized, prompt [place] is recommended for further evaluation. Note: This recommendation does not apply to premenarchal patients and to those with increased risk (genetic, family history, elevated tumor markers or other high-risk factors) of ovarian cancer. Reference: JACR 2020 Feb; 17(2):248-254 Electronically Signed   By: Titus Dubin M.D.   On: 11/22/2019 16:34   DG Knee Complete 4 Views Right  Result Date: 11/22/2019 CLINICAL DATA:  Recent fall with right knee pain, initial encounter EXAM: RIGHT KNEE - COMPLETE 4+ VIEW COMPARISON:  08/03/2017 FINDINGS: No acute fracture or dislocation is noted. Calcification in the medial tibial plateau is noted without acute abnormality. This is stable from the prior exam. No acute abnormality noted. IMPRESSION: No acute abnormality noted. Electronically Signed   By: Inez Catalina M.D.   On: 11/22/2019 17:09    Procedures Procedures (including critical care time)  Medications Ordered in ED Medications  oxyCODONE (Oxy IR/ROXICODONE) immediate release  tablet 5 mg (5 mg Oral Given 11/22/19 1531)    ED Course  I have reviewed the triage vital signs and the nursing notes.  Pertinent labs & imaging results that were available during my care of the patient were reviewed by me and considered in my medical decision making (see chart for details).  Patient seen and examined.  Reviewed NP note from earlier today.  Probable I had a pleasant discussion with the patient's son at bedside.  Patient is unable to well localize where she is having pain but does appear to be in significant discomfort.  I am unable to see results or what exact imaging was performed, other than a hip x-ray.  We will obtain plain films  of the right upper extremity.  Will obtain CT of her back, right hip, right femur, and right knee as well as plain films of the right ankle.  Vital signs reviewed and are as follows: BP (!) 168/48 (BP Location: Right Arm)   Pulse 71   Temp 98.2 F (36.8 C) (Oral)   Resp 19   SpO2 100%   5:34 PM Imaging reviewed with Dr. Ronnald Nian, who will see patient.   6:44 PM Plan for admit. Labs ordered. Discussed with Dr. Rolena Infante, on-call with orthopedics.   Recommend sling for scapular fracture, weightbearing as tolerated for pelvis fracture, continued pain control.  He will consult on patient.  Plan for admission to the hospital when lab results return.  Son updated. Pt comfortable.   8:02 PM Spoke with Dr. Cyd Silence who [facility] patient to [facility] pending CT head findings.   8:12 PM Texted Dr. Cyd Silence with findings suggestive of acute diverticulitis.    MDM Rules/Calculators/A&P                          Admit.   Final Clinical Impression(s) / ED Diagnoses Final diagnoses:  Closed nondisplaced fracture of body of right scapula, initial encounter  Closed fracture of right scapula, unspecified part of scapula, initial encounter  Other closed fracture of left pubis, initial encounter ([place])  Closed compression fracture of body of L1 vertebra ([place])  Acute diverticulitis    Rx / DC Orders ED Discharge Orders    None        Carlisle Cater, PA-C 11/22/19 2013    Lennice Sites, DO 11/22/19 2157

## 2019-11-22 NOTE — ED Triage Notes (Signed)
Per EMS:  Pt from [facility].  Fell on Saturday.  Pt had xrays done at that time.  Pt having severe pain.  Sent here by provider for continuing evaluation and pain management.  Pt has been given one percocet this am.  Pt comfortable at this time.

## 2019-11-22 NOTE — ED Notes (Signed)
Called [facility]  and updated on plan of care for admission. Spoke with patient's nurse, Ermalinda Barrios, clarified patient is a DNR.

## 2019-11-22 NOTE — ED Notes (Signed)
PT asking for water but told that we are unable to do so at this time due to X-rays still resulting. RN aware

## 2019-11-23 DIAGNOSIS — J189 Pneumonia, unspecified organism: Secondary | ICD-10-CM | POA: Diagnosis not present

## 2019-11-23 DIAGNOSIS — W19XXXA Unspecified fall, initial encounter: Secondary | ICD-10-CM | POA: Diagnosis not present

## 2019-11-23 MED ORDER — MORPHINE SULFATE (PF) 2 MG/ML IV SOLN: 1.0000 mg | INTRAVENOUS | Status: DC | PRN

## 2019-11-23 MED ORDER — ALBUTEROL SULFATE (2.5 MG/3ML) 0.083% IN NEBU: 2.5000 mg | INHALATION_SOLUTION | Freq: Four times a day (QID) | RESPIRATORY_TRACT | Status: DC | PRN

## 2019-11-23 MED ORDER — ALBUTEROL SULFATE (2.5 MG/3ML) 0.083% IN NEBU: 2.5000 mg | INHALATION_SOLUTION | Freq: Four times a day (QID) | RESPIRATORY_TRACT | Status: DC

## 2019-11-23 MED ORDER — ASPIRIN EC 81 MG PO TBEC
81.0000 mg | DELAYED_RELEASE_TABLET | Freq: Every day | ORAL | Status: DC
  Administered 2019-11-23 – 2019-11-29 (×7): 81 mg via ORAL
  Filled 2019-11-23 (×7): qty 1

## 2019-11-23 MED ORDER — SENNA 8.6 MG PO TABS
1.0000 | ORAL_TABLET | Freq: Two times a day (BID) | ORAL | Status: DC
  Administered 2019-11-23 – 2019-11-26 (×7): 8.6 mg via ORAL
  Filled 2019-11-23 (×7): qty 1

## 2019-11-23 MED ORDER — HYDROCODONE-ACETAMINOPHEN 5-325 MG PO TABS
1.0000 | ORAL_TABLET | ORAL | Status: DC | PRN
  Administered 2019-11-23 (×2): 2 via ORAL
  Filled 2019-11-23 (×3): qty 2

## 2019-11-23 MED ORDER — ATORVASTATIN CALCIUM 10 MG PO TABS
10.0000 mg | ORAL_TABLET | Freq: Every day | ORAL | Status: DC
  Administered 2019-11-23 – 2019-11-29 (×7): 10 mg via ORAL
  Filled 2019-11-23 (×7): qty 1

## 2019-11-23 MED ORDER — CARVEDILOL 6.25 MG PO TABS: 6.2500 mg | ORAL_TABLET | Freq: Two times a day (BID) | ORAL | Status: DC

## 2019-11-23 MED ORDER — TIZANIDINE HCL 4 MG PO TABS
2.0000 mg | ORAL_TABLET | Freq: Three times a day (TID) | ORAL | Status: DC
  Administered 2019-11-23 – 2019-11-29 (×20): 2 mg via ORAL
  Filled 2019-11-23 (×22): qty 1

## 2019-11-23 MED ORDER — HYDRALAZINE HCL 20 MG/ML IJ SOLN
10.0000 mg | Freq: Four times a day (QID) | INTRAMUSCULAR | Status: DC | PRN
  Administered 2019-11-23: 10 mg via INTRAVENOUS
  Filled 2019-11-23: qty 1

## 2019-11-23 MED ORDER — POLYETHYLENE GLYCOL 3350 17 G PO PACK: 17.0000 g | PACK | Freq: Every day | ORAL | Status: DC | PRN

## 2019-11-23 MED ORDER — AMIODARONE HCL 100 MG PO TABS
100.0000 mg | ORAL_TABLET | Freq: Every day | ORAL | Status: DC
  Administered 2019-11-23 – 2019-11-29 (×7): 100 mg via ORAL
  Filled 2019-11-23 (×7): qty 1

## 2019-11-23 MED ORDER — SODIUM CHLORIDE 0.9 % IV SOLN: INTRAVENOUS | Status: DC

## 2019-11-23 MED ORDER — IPRATROPIUM-ALBUTEROL 0.5-2.5 (3) MG/3ML IN SOLN: 3.0000 mL | Freq: Four times a day (QID) | RESPIRATORY_TRACT | Status: DC | PRN

## 2019-11-23 MED ORDER — ACETAMINOPHEN 325 MG PO TABS: 650.0000 mg | ORAL_TABLET | Freq: Four times a day (QID) | ORAL | Status: DC | PRN

## 2019-11-23 MED ORDER — PANTOPRAZOLE SODIUM 40 MG PO TBEC
40.0000 mg | DELAYED_RELEASE_TABLET | Freq: Every day | ORAL | Status: DC
  Administered 2019-11-23 – 2019-11-29 (×7): 40 mg via ORAL
  Filled 2019-11-23 (×7): qty 1

## 2019-11-23 MED ORDER — DOCUSATE SODIUM 100 MG PO CAPS
100.0000 mg | ORAL_CAPSULE | ORAL | Status: DC
  Administered 2019-11-23 – 2019-11-25 (×2): 100 mg via ORAL
  Filled 2019-11-23 (×5): qty 1

## 2019-11-23 MED ORDER — ACETAMINOPHEN 650 MG RE SUPP: 650.0000 mg | Freq: Four times a day (QID) | RECTAL | Status: DC | PRN

## 2019-11-23 MED ORDER — CARVEDILOL 6.25 MG PO TABS
6.2500 mg | ORAL_TABLET | Freq: Two times a day (BID) | ORAL | Status: DC
  Administered 2019-11-23 – 2019-11-29 (×12): 6.25 mg via ORAL
  Filled 2019-11-23 (×12): qty 1

## 2019-11-23 MED ORDER — OXYCODONE-ACETAMINOPHEN 7.5-325 MG PO TABS: 1.0000 | ORAL_TABLET | ORAL | Status: DC | PRN

## 2019-11-23 MED ORDER — LEVOTHYROXINE SODIUM 50 MCG PO TABS
75.0000 ug | ORAL_TABLET | Freq: Every day | ORAL | Status: DC
  Administered 2019-11-26 – 2019-11-29 (×4): 75 ug via ORAL
  Filled 2019-11-23 (×6): qty 1

## 2019-11-23 MED ORDER — INFLUENZA VAC A&B SA ADJ QUAD 0.5 ML IM PRSY
0.5000 mL | PREFILLED_SYRINGE | INTRAMUSCULAR | Status: DC
  Filled 2019-11-23: qty 0.5

## 2019-11-23 NOTE — H&P (Signed)
[facility] History and Physical  [name] LPF:[id number] DOB: 01/31/1931 DOA: 11/22/2019 PCP: [name], DO  Admitted from: Assisted living facility Chief Complaint: Mechanical fall  History of Present Illness: Sara Russell is a 86 y.o. female with PMH significant for stroke in 2019 with residual right upper extremity weakness, expressive aphasia; hypertension, hyperlipidemia, persistent A. fib, DVT/PE, diverticulosis, ulcerative colitis. Patient is alert, awake and able to listen to conversation but has significant expressive aphasia.  Her son at bedside helping with the history. Since her stroke in 2019, patient has been living in an assisted living facility.  At baseline, she has expressive aphasia and weakness of right upper extremity but has no dysphagia and is able to ambulate with a walker.  In respect to patient's wish, she is DNR/DNI and does not want any aggressive measures. 10/9, in the morning while trying to open the refrigerator, patient lost balance and fell on the floor.  Staff attended immediately and did not notice any major injury.  Her son visited in the afternoon and at that time, she complained of pain in the right shoulder and left hip. X-rays were done at the facility and were negative. Her pain got worse in the next 48 hours and hence sent to the ED.  She was seen at ED in [facility]. Multiple imagings were obtained.  Significant findings being right scapula fracture, left pubic bone fracture and chronic compression fracture of L1 vertebrae.  ED physician discussed the case with orthopedics on-call Dr. Rolena Infante who recommended nonsurgical conservative management. Patient was accepted for admission last night but because of bed unavailability, patient arrived this afternoon only.  At the time of my evaluation, patient was alert, awake, comfortable.  Son was at bedside and help all with the history.   Review of Systems:  All systems  were reviewed and were negative unless otherwise mentioned in the HPI   Past medical history: Past Medical History:  Diagnosis Date  . Abdominal mass   . AKI (acute kidney injury) ([place]) 09/15/2014  . Atrial fibrillation ([place])   . Colitis, ulcerative ([place])   . Cough    WHITE SPUTUM FOR 2 WEEKS, NO FEVER  . Degenerative joint disease   . Diverticulosis   . DVT (deep venous thrombosis) ([place]) 2009   BOTH LUNGS  . GERD (gastroesophageal reflux disease)   . Hyperlipidemia   . Hyperplastic colon polyp   . Hypertension   . Hypothyroidism   . Iron deficiency anemia    ON IRON AT TIMES  . PE (pulmonary embolism) 2009  . Persistent atrial fibrillation ([place])   . Positive H. pylori test 01/20/96  . S/P dilatation of esophageal stricture 1992  . Skin cancer    h/o basal and squamous skin cancer--> removed  . Stroke [facility])     Past surgical history: Past Surgical History:  Procedure Laterality Date  . APPENDECTOMY    . BREAST EXCISIONAL BIOPSY Left 1992   benign  . BREAST LUMPECTOMY     benign  . DILATION AND CURETTAGE OF UTERUS     for endometrial polyps  . ENDOSCOPIC RETROGRADE CHOLANGIOPANCREATOGRAPHY (ERCP) WITH PROPOFOL N/A 03/23/2015   Procedure: ENDOSCOPIC RETROGRADE CHOLANGIOPANCREATOGRAPHY (ERCP) WITH PROPOFOL;  Surgeon: Milus Banister, MD;  Location: [facility];  Service: Endoscopy;  Laterality: N/A;  . JOINT REPLACEMENT    . LOOP RECORDER INSERTION N/A 10/14/2017   Procedure: LOOP RECORDER INSERTION;  Surgeon: Thompson Grayer, MD;  Location: [place] [facility];  Service: Cardiovascular;  Laterality: N/A;  . ORIF ANKLE FRACTURE Left    X 2  . REPLACEMENT TOTAL KNEE     left  . SURGERY DONE TO REMOVE LEFT ANKLE HARDWARE LATER    . TONSILLECTOMY      Social History:  reports that she quit smoking about 58 years ago. Her smoking use included cigarettes. She started smoking about 71 years ago. She has a 6.50 pack-year smoking history. She has never used smokeless tobacco.  She reports current alcohol use of about 5.0 standard drinks of alcohol per week. She reports that she does not use drugs.  Allergies:  Allergies  Allergen Reactions  . Iohexol Hives  . Ivp Dye [Iodinated Diagnostic Agents] Hives    Was able to tolerate Contrast Media for CT scan.  [name] [Shellfish Allergy] Nausea And Vomiting    Projectile vomiting  . Shellfish-Derived Products Nausea And Vomiting  . Sulfa Antibiotics Hives    Family history:  Family History  Problem Relation Age of Onset  . Esophageal cancer Brother   . Diverticulitis Brother   . Heart disease Mother   . Stroke Mother   . Dementia Mother   . Heart attack Mother   . Heart disease Father   . Atrial fibrillation Father   . Breast cancer Maternal Aunt   . Colon cancer Neg Hx      Home Meds: Prior to Admission medications   Medication Sig Start Date End Date Taking? Authorizing Provider  acetaminophen (TYLENOL) 500 MG tablet Take 1,000 mg by mouth 2 (two) times daily.     [provider]  amiodarone (PACERONE) 100 MG tablet Take 1 tablet (100 mg total) by mouth daily. 07/29/17   Zenia Resides, MD  aspirin 81 MG tablet Take 1 tablet (81 mg total) by mouth daily. 12/25/17   Frann Rider, NP  atorvastatin (LIPITOR) 10 MG tablet Take 10 mg by mouth daily.    [provider]  Ca Phosphate-Cholecalciferol (CALTRATE GUMMY BITES PO) Take 1 tablet by mouth 2 (two) times daily.    [provider]  carvedilol (COREG) 6.25 MG tablet Take 12.5 mg by mouth 2 (two) times daily with a meal.    [provider]  docusate sodium (COLACE) 100 MG capsule Take 100 mg by mouth every other day.    [provider]  Hypromellose (GONIOVISC) 2 % SOLN Place 1 drop into both eyes daily as needed.    [provider]  ipratropium-albuterol (DUONEB) 0.5-2.5 (3) MG/3ML SOLN Take 3 mLs by nebulization every 6 (six) hours as needed.    [provider]  levothyroxine  (SYNTHROID) 75 MCG tablet Take 1 tablet (75 mcg total) by mouth daily before breakfast. 05/12/19   Reed, Tiffany L, DO  Melatonin 5 MG TABS Take 5 mg by mouth at bedtime.    [provider]  Menthol-Methyl Salicylate (ICY HOT EXTRA STRENGTH) 10-30 % CREA Apply topically 2 (two) times daily.    [provider]  ondansetron (ZOFRAN) 4 MG tablet Take 4 mg by mouth every 6 (six) hours as needed for nausea or vomiting.    [provider]  pantoprazole (PROTONIX) 40 MG tablet Take 40 mg by mouth daily.    [provider]  polyethylene glycol (MIRALAX / GLYCOLAX) 17 g packet Take 17 g by mouth daily as needed. Hold if loose stools occur    [provider]  potassium chloride 20 MEQ/15ML (10%) SOLN Take 7.5 mLs (10 mEq  total) by mouth daily. 08/28/17   Love, Ivan Anchors, PA-C  Simethicone (GAS RELIEF PO) Take 1 tablet by mouth 2 (two) times daily.    [provider]  sodium chloride 1 g tablet Take 1 g by mouth 2 (two) times daily with a meal.    [provider]  tiZANidine (ZANAFLEX) 2 MG tablet Take 1 tablet (2 mg total) by mouth 4 (four) times daily -  with meals and at bedtime. 08/26/17   Love, Ivan Anchors, PA-C  WITCH HAZEL EX Apply 1 application topically 2 (two) times daily as needed.     [provider]    Physical Exam: Vitals:   11/23/19 1130 11/23/19 1200 11/23/19 1230 11/23/19 1513  BP: (!) 169/79 (!) 168/78 (!) 164/79 (!) 186/102  Pulse:  74  92  Resp: 18 19 (!) 21 15  Temp:    98.2 F (36.8 C)  TempSrc:    Oral  SpO2: 94% 93%  96%   Wt Readings from Last 3 Encounters:  10/20/19 62.4 kg  05/07/19 57.6 kg  04/14/19 56.2 kg   There is no height or weight on file to calculate BMI.  General exam: Appears calm and comfortable.  Not in distress at the time of my evaluation Skin: No rashes, lesions or ulcers. HEENT: Atraumatic, normocephalic, supple neck, no obvious bleeding Lungs: Clear to auscultation bilaterally CVS:  Regular rate and rhythm, no murmur GI/Abd soft, nondistended, nontender, bowel sound present CNS: Alert, awake, at baseline, she understands the conversation but has expressive aphasia.  Psychiatry: Mood appropriate Extremities: No pedal edema, no calf tenderness.  Right upper extremity paresis.     Consult Orders  (From admission, onward)         Start     Ordered   11/23/19 1738  PT eval and treat  Routine        11/23/19 1737   11/22/19 1928  Consult to hospitalist  ALL PATIENTS BEING ADMITTED/HAVING PROCEDURES NEED COVID-19 SCREENING Call made via [facility] @ 1936  Once       Comments: ALL PATIENTS BEING ADMITTED/HAVING PROCEDURES NEED COVID-19 SCREENING  Provider:  (Not yet assigned)  Question Answer Comment  Place call to: [facility]   Reason for Consult Admit      11/22/19 1927          Labs on Admission:   CBC: Recent Labs  Lab 11/22/19 1844  WBC 14.5*  NEUTROABS 12.6*  HGB 10.8*  HCT 33.8*  MCV 91.8  PLT 127    Basic Metabolic Panel: Recent Labs  Lab 11/22/19 1844  NA 136  K 4.9  CL 103  CO2 24  GLUCOSE 117*  BUN 33*  CREATININE 1.69*  CALCIUM 8.5*    Liver Function Tests: Recent Labs  Lab 11/22/19 1844  AST 21  ALT 15  ALKPHOS 117  BILITOT 0.3  PROT 6.9  ALBUMIN 3.1*   No results for input(s): LIPASE, AMYLASE in the last 168 hours. No results for input(s): AMMONIA in the last 168 hours.  Cardiac Enzymes: Recent Labs  Lab 11/22/19 1844  CKTOTAL 32*    BNP (last 3 results) No results for input(s): BNP in the last 8760 hours.  ProBNP (last 3 results) No results for input(s): PROBNP in the last 8760 hours.  CBG: No results for input(s): GLUCAP in the last 168 hours.  Lipase  No results found for: LIPASE   Urinalysis    Component Value Date/Time   COLORURINE YELLOW 11/22/2019  2110   APPEARANCEUR CLOUDY (A) 11/22/2019 2110   LABSPEC 1.020 11/22/2019 2110   PHURINE 6.0 11/22/2019 2110   GLUCOSEU NEGATIVE  11/22/2019 2110   HGBUR TRACE (A) 11/22/2019 2110   BILIRUBINUR NEGATIVE 11/22/2019 2110   BILIRUBINUR SMALL 08/28/2010 1018   KETONESUR NEGATIVE 11/22/2019 2110   PROTEINUR 30 (A) 11/22/2019 2110   UROBILINOGEN 0.2 09/20/2014 1427   NITRITE POSITIVE (A) 11/22/2019 2110   LEUKOCYTESUR LARGE (A) 11/22/2019 2110     Drugs of Abuse     Component Value Date/Time   LABOPIA NONE DETECTED 08/02/2017 1418   COCAINSCRNUR NONE DETECTED 08/02/2017 1418   LABBENZ NONE DETECTED 08/02/2017 1418   AMPHETMU NONE DETECTED 08/02/2017 1418   THCU NONE DETECTED 08/02/2017 1418   LABBARB (A) 08/02/2017 1418    Result not available. Reagent lot number recalled by manufacturer.      Radiological Exams on Admission: DG Pelvis 1-2 Views  Result Date: 11/22/2019 CLINICAL DATA:  Recent fall with pelvic pain, initial encounter EXAM: PELVIS - 1-2 VIEW COMPARISON:  08/03/2017 FINDINGS: Pelvic ring is intact. Fragmentation is noted along the superior pubic ramus on the left. No other definitive fracture is seen. No soft tissue abnormality is noted. IMPRESSION: Left superior pubic ramus fracture. No other focal abnormality is noted. Electronically Signed   By: Inez Catalina M.D.   On: 11/22/2019 17:15   DG Shoulder Right  Result Date: 11/22/2019 CLINICAL DATA:  Recent fall with right shoulder pain, initial encounter EXAM: RIGHT SHOULDER - 2+ VIEW COMPARISON:  None. FINDINGS: Degenerative changes of the acromioclavicular joint and glenohumeral joint are seen. The is high-riding articulating with the acromion consistent with chronic rotator cuff injury. Bony density is noted anterior to the humeral head on the Y-view which suggests a coracoid fracture. Additionally fragmentation in the scapular body is noted. IMPRESSION: Changes consistent with scapular fracture in the body as well as at the coracoid process. Cross-sectional imaging may be helpful for further evaluation. Degenerative changes are noted about the  shoulder joint with findings of a chronic rotator cuff injury. Electronically Signed   By: Inez Catalina M.D.   On: 11/22/2019 17:00   DG Elbow Complete Right  Result Date: 11/22/2019 CLINICAL DATA:  Fall 2 days ago with elbow pain, initial encounter EXAM: RIGHT ELBOW - COMPLETE 3+ VIEW COMPARISON:  None. FINDINGS: No acute fracture or dislocation is noted. Small joint effusion is noted with elevation of the anterior fat pad. Mild soft tissue swelling is noted posteriorly. IMPRESSION: Small joint effusion without acute fracture. Mild soft tissue swelling is noted posteriorly. Electronically Signed   By: Inez Catalina M.D.   On: 11/22/2019 17:05   DG Ankle Complete Right  Result Date: 11/22/2019 CLINICAL DATA:  Recent fall with right ankle pain, initial encounter EXAM: RIGHT ANKLE - COMPLETE 3+ VIEW COMPARISON:  None. FINDINGS: Generalized osteopenia is noted. No soft tissue abnormality is seen. No acute fracture or dislocation is noted. Tarsal degenerative changes are seen. IMPRESSION: Chronic changes without acute abnormality. Electronically Signed   By: Inez Catalina M.D.   On: 11/22/2019 17:12   CT Head Wo Contrast  Result Date: 11/22/2019 CLINICAL DATA:  86 year old female status post fall 3 days ago with pain. EXAM: CT HEAD WITHOUT CONTRAST TECHNIQUE: Contiguous axial images were obtained from the base of the skull through the vertex without intravenous contrast. COMPARISON:  Brain MRI 08/03/2017.  Head CT 10/06/2017. FINDINGS: Brain: Cerebral volume is not significantly changed since 2019. Patchy and confluent bilateral white  matter hypodensity appears increased. No midline shift, ventriculomegaly, mass effect, evidence of mass lesion, intracranial hemorrhage or evidence of cortically based acute infarction. Vascular: Calcified atherosclerosis at the skull base. No suspicious intracranial vascular hyperdensity. Skull: No acute osseous abnormality identified. Sinuses/Orbits: New low-density fluid or  mucosal thickening in the right posterior ethmoid, sphenoid sinuses. Other visible sinuses, tympanic cavities and mastoids remain well pneumatized. Other: No acute orbit or scalp soft tissue finding. IMPRESSION: 1. No acute intracranial abnormality or recent traumatic injury identified. 2. Progressed chronic white matter disease since 2019. 3. New mild posterior ethmoid and sphenoid sinus inflammation. Electronically Signed   By: Genevie Ann M.D.   On: 11/22/2019 19:55   CT Lumbar Spine Wo Contrast  Result Date: 11/22/2019 CLINICAL DATA:  Fall.  Severe back pain EXAM: CT LUMBAR SPINE WITHOUT CONTRAST TECHNIQUE: Multidetector CT imaging of the lumbar spine was performed without intravenous contrast administration. Multiplanar CT image reconstructions were also generated. COMPARISON:  Lumbar spine radiographs 02/23/2015 FINDINGS: Segmentation: Normal Alignment: Moderate levoscoliosis.  Normal sagittal alignment. Vertebrae: Severe compression fracture L1 vertebral body. This has progressed since the prior study in 2017 when there was a mild fracture of the superior endplate of L1. Mild bony retropulsion of the superior endplate of L1 into the canal causing mild spinal stenosis. No other fracture Paraspinal and other soft tissues: No significant paraspinous edema at the L1 fracture. Atherosclerotic aorta without aneurysm Cholelithiasis Large fluid collection in the left pelvis measuring 4.5 x 7 cm. This appears to be adnexal in location. Disc levels: Multilevel disc and facet degeneration throughout the lumbar spine. Mild spinal stenosis at L1 due to posterior retropulsion of superior endplate. No other areas of significant spinal stenosis. IMPRESSION: 1. Severe compression fracture of L1, with progression since 2017. This could be a superimposed acute fracture or chronic fracture. Mild spinal stenosis due to retropulsion into the canal. No other fracture. 2. Cholelithiasis 3. 4.5 x 7 mm fluid collection left pelvis  likely from the adnexa. Because this lesion is not adequately characterized, prompt [place] is recommended for further evaluation. Note: This recommendation does not apply to premenarchal patients and to those with increased risk (genetic, family history, elevated tumor markers or other high-risk factors) of ovarian cancer. Reference: JACR 2020 Feb; 17(2):248-254 Electronically Signed   By: Franchot Gallo M.D.   On: 11/22/2019 16:17   CT PELVIS WO CONTRAST  Result Date: 11/22/2019 CLINICAL DATA:  Fall several days ago with pelvic pain, initial encounter EXAM: CT PELVIS WITHOUT CONTRAST TECHNIQUE: Multidetector CT imaging of the pelvis was performed following the standard protocol without intravenous contrast. COMPARISON:  CT of the right femur from earlier in the same day, CT from 09/18/2014. FINDINGS: Urinary Tract:  Bladder is partially distended. Bowel: Scattered feet fecal material is noted throughout the colon. Mild diverticular changes noted. Some very mild inflammatory changes are noted surrounding the distal descending colon which may represent some very mild diverticulitis. Vascular/Lymphatic: Atherosclerotic calcifications are noted without aneurysmal dilatation. No sizable lymphadenopathy is noted. Reproductive: Uterus is within normal limits. Scattered fluid attenuation lesions are noted within the adnexa. The largest of these lies on the left measuring approximately 6.8 cm. This is similar to that seen on prior CT from 2016 and consistent with a benign etiology. Other:  None. Musculoskeletal: Degenerative changes of lumbar spine are noted. The sacrum appears within normal limits. Old healed fractures of the superior and inferior pubic rami on the right are noted. There are changes of acute fracture adjacent to  the pubic symphysis in both the inferior and superior pubic rami. This corresponds with that seen on prior CT as well as the recent plain film evaluation. Some adjacent stranding is noted  consistent with localized edema/hemorrhage related to the injury. No focal confluent hematoma is seen. IMPRESSION: Fractures of the left superior and inferior pubic rami adjacent to the pubic symphysis as described above similar to that seen on prior CT of the femur. Large left adnexal cyst similar to that noted on a prior CT from 2016. Given its long-term stability it is felt to be benign in etiology. Changes suggestive of early diverticulitis in the distal descending colon. Electronically Signed   By: Inez Catalina M.D.   On: 11/22/2019 19:58   CT FEMUR RIGHT WO CONTRAST  Result Date: 11/22/2019 CLINICAL DATA:  Severe right thigh pain after fall on Saturday. EXAM: CT OF THE LOWER RIGHT EXTREMITY WITHOUT CONTRAST TECHNIQUE: Multidetector CT imaging of the right lower extremity was performed according to the standard protocol. COMPARISON:  Right femur x-rays dated August 03, 2017. FINDINGS: Bones/Joint/Cartilage Partially visualized acute parasymphyseal fractures of the left superior and inferior pubic rami. Old healed fractures of the right superior and inferior pubic rami. No dislocation. Serpiginous subchondral sclerosis in the medial tibial plateau likely represents a a bone infarct. Mild tricompartmental degenerative changes of the knee. The right hip joint space is preserved. Osteopenia. No joint effusion. Ligaments Ligaments are suboptimally evaluated by CT. Muscles and Tendons Grossly intact. Atrophy of the semimembranosus and biceps femoris muscles. Soft tissue Small amount of ill-defined high-density material in the superficial fat of the right hip along the inferior aspect of the greater trochanter, likely hemorrhage. No soft tissue mass. 6.9 x 5.2 cm simple appearing cystic lesion arising from the left ovary. IMPRESSION: 1. Partially visualized acute parasymphyseal fractures of the left superior and inferior pubic rami. Old healed fractures of the right superior and inferior pubic rami. 2. Small  ill-defined hematoma in the superficial fat of the right hip along the inferior aspect of the greater trochanter. No underlying fracture. 3. 6.9 cm cystic lesion arising from the left ovary. Because this lesion is not adequately characterized, prompt [place] is recommended for further evaluation. Note: This recommendation does not apply to premenarchal patients and to those with increased risk (genetic, family history, elevated tumor markers or other high-risk factors) of ovarian cancer. Reference: JACR 2020 Feb; 17(2):248-254 Electronically Signed   By: Titus Dubin M.D.   On: 11/22/2019 16:34   DG Chest Port 1 View  Result Date: 11/22/2019 CLINICAL DATA:  Admission for pneumonia, known fractures of the shoulder and pelvis. EXAM: PORTABLE CHEST 1 VIEW COMPARISON:  Chest x-ray 02/11/2018, CT chest 01/31/2017 FINDINGS: Wireless cardiac device overlies the left chest. The heart size and mediastinal contours are unchanged. Redemonstration of a large lucency overlying the mediastinum consistent with known hiatal hernia. Aortic arch calcification. Right base airspace opacity as well as right trace pleural effusion. No definite consolidation within the left lung with persistent passive atelectasis of the lingula. No pulmonary edema. No left pleural effusion. No pneumothorax. No acute displaced fracture identified. Per history, there is a known shoulder fracture does not visualized on this study and may be collimated off view. IMPRESSION: 1. Right basilar airspace opacity with associated trace pleural effusion suggestive of infection/inflammation. Followup PA and lateral chest X-ray is recommended in 3-4 weeks following trial of antibiotic therapy to ensure resolution and exclude underlying malignancy. 2. Large hiatal hernia. 3.  Aortic Atherosclerosis (ICD10-I70.0). Electronically  Signed   By: Iven Finn M.D.   On: 11/22/2019 20:48   DG Knee Complete 4 Views Right  Result Date: 11/22/2019 CLINICAL DATA:   Recent fall with right knee pain, initial encounter EXAM: RIGHT KNEE - COMPLETE 4+ VIEW COMPARISON:  08/03/2017 FINDINGS: No acute fracture or dislocation is noted. Calcification in the medial tibial plateau is noted without acute abnormality. This is stable from the prior exam. No acute abnormality noted. IMPRESSION: No acute abnormality noted. Electronically Signed   By: Inez Catalina M.D.   On: 11/22/2019 17:09   ------------------------------------------------------------------------------------------------------ Assessment/Plan: Active Problems:   Fall  Multiple fractures secondary to fall -Multiple imagings were obtained.  Significant findings being right scapula fracture, left pubic bone fracture and chronic compression fracture of L1 vertebrae. -ED physician discussed the case with orthopedics on-call Dr. Rolena Infante who recommended nonsurgical conservative management. -We will order for sling for right arm fracture. -Pain control with Percocet as needed.  Continue Zanaflex as before. -PT eval ordered.  Cardiovascular issues:  Stroke in 2019 with residual right upper extremity weakness, expressive aphasia hypertension, hyperlipidemia,  persistent A. Fib -Resume home meds which include Coreg 6.25 mg twice daily, Amiodarone 100 mg daily, aspirin 81 mg daily and Lipitor 10 mg daily -Continue monitor in telemetry.  Hypothyroidism -Synthroid 75 mcg daily  Abnormal CT abdomen finding -There is a mention of early acute diverticulitis and CT scan finding.  However patient is not symptomatic with it at this time.  No fever, abdominal pain, diarrhea. -I would avoid IV antibiotics at this time.  Continue to monitor.  Other home meds include Protonix 40 mg daily, Sodium chloride tablet 1 g twice daily  Mobility: PT eval ordered with Code Status:   Code Status: DNR DNR/DNI.  Discussed with patient and her son at bedside DVT prophylaxis:  SCDs Antimicrobials:  None Fluid: None  Diet:  Diet  Order            Diet regular Room service appropriate? No; Fluid consistency: Thin  Diet effective now                Consultants: Orthopedics was consulted by ED on the phone Family Communication:  Son at bedside  Dispo: The patient is from: ALF              Anticipated d/c is to: SNF most likely              Anticipated d/c date is: 2 to 3 days  ------------------------------------------------------------------------------------- Severity of Illness: The appropriate patient status for this patient is OBSERVATION. Observation status is judged to be reasonable and necessary in order to provide the required intensity of service to ensure the patient's safety. The patient's presenting symptoms, physical exam findings, and initial radiographic and laboratory data in the context of their medical condition is felt to place them at decreased risk for further clinical deterioration. Furthermore, it is anticipated that the patient will be medically stable for discharge from the hospital within 2 midnights of admission. The following factors support the patient status of observation.   " The patient's presenting symptoms include fall. " The physical exam findings include pain. " The initial radiographic and laboratory data are fractures.  Signed, Terrilee Croak, MD [facility] 11/23/2019

## 2019-11-24 DIAGNOSIS — W19XXXA Unspecified fall, initial encounter: Secondary | ICD-10-CM | POA: Diagnosis not present

## 2019-11-24 DIAGNOSIS — I4819 Other persistent atrial fibrillation: Secondary | ICD-10-CM | POA: Diagnosis present

## 2019-11-24 DIAGNOSIS — S42114A Nondisplaced fracture of body of scapula, right shoulder, initial encounter for closed fracture: Secondary | ICD-10-CM | POA: Diagnosis not present

## 2019-11-24 DIAGNOSIS — Y92099 Unspecified place in other non-institutional residence as the place of occurrence of the external cause: Secondary | ICD-10-CM | POA: Diagnosis not present

## 2019-11-24 DIAGNOSIS — S42101A Fracture of unspecified part of scapula, right shoulder, initial encounter for closed fracture: Secondary | ICD-10-CM | POA: Diagnosis not present

## 2019-11-24 DIAGNOSIS — N1832 Chronic kidney disease, stage 3b: Secondary | ICD-10-CM | POA: Diagnosis present

## 2019-11-24 DIAGNOSIS — I69320 Aphasia following cerebral infarction: Secondary | ICD-10-CM | POA: Diagnosis not present

## 2019-11-24 DIAGNOSIS — K5190 Ulcerative colitis, unspecified, without complications: Secondary | ICD-10-CM | POA: Diagnosis present

## 2019-11-24 DIAGNOSIS — Z7401 Bed confinement status: Secondary | ICD-10-CM | POA: Diagnosis not present

## 2019-11-24 DIAGNOSIS — Z7982 Long term (current) use of aspirin: Secondary | ICD-10-CM | POA: Diagnosis not present

## 2019-11-24 DIAGNOSIS — K449 Diaphragmatic hernia without obstruction or gangrene: Secondary | ICD-10-CM | POA: Diagnosis present

## 2019-11-24 DIAGNOSIS — S42131A Displaced fracture of coracoid process, right shoulder, initial encounter for closed fracture: Secondary | ICD-10-CM | POA: Diagnosis present

## 2019-11-24 DIAGNOSIS — E785 Hyperlipidemia, unspecified: Secondary | ICD-10-CM | POA: Diagnosis present

## 2019-11-24 DIAGNOSIS — M75100 Unspecified rotator cuff tear or rupture of unspecified shoulder, not specified as traumatic: Secondary | ICD-10-CM | POA: Diagnosis present

## 2019-11-24 DIAGNOSIS — Z66 Do not resuscitate: Secondary | ICD-10-CM | POA: Diagnosis present

## 2019-11-24 DIAGNOSIS — I10 Essential (primary) hypertension: Secondary | ICD-10-CM

## 2019-11-24 DIAGNOSIS — R197 Diarrhea, unspecified: Secondary | ICD-10-CM | POA: Diagnosis not present

## 2019-11-24 DIAGNOSIS — R4182 Altered mental status, unspecified: Secondary | ICD-10-CM | POA: Diagnosis not present

## 2019-11-24 DIAGNOSIS — Z79890 Hormone replacement therapy (postmenopausal): Secondary | ICD-10-CM | POA: Diagnosis not present

## 2019-11-24 DIAGNOSIS — I5032 Chronic diastolic (congestive) heart failure: Secondary | ICD-10-CM | POA: Diagnosis present

## 2019-11-24 DIAGNOSIS — I130 Hypertensive heart and chronic kidney disease with heart failure and stage 1 through stage 4 chronic kidney disease, or unspecified chronic kidney disease: Secondary | ICD-10-CM | POA: Diagnosis present

## 2019-11-24 DIAGNOSIS — J189 Pneumonia, unspecified organism: Secondary | ICD-10-CM | POA: Diagnosis present

## 2019-11-24 DIAGNOSIS — E039 Hypothyroidism, unspecified: Secondary | ICD-10-CM | POA: Diagnosis present

## 2019-11-24 DIAGNOSIS — N390 Urinary tract infection, site not specified: Secondary | ICD-10-CM | POA: Diagnosis present

## 2019-11-24 DIAGNOSIS — G9341 Metabolic encephalopathy: Secondary | ICD-10-CM | POA: Diagnosis present

## 2019-11-24 DIAGNOSIS — S32010A Wedge compression fracture of first lumbar vertebra, initial encounter for closed fracture: Secondary | ICD-10-CM | POA: Diagnosis not present

## 2019-11-24 DIAGNOSIS — I69398 Other sequelae of cerebral infarction: Secondary | ICD-10-CM | POA: Diagnosis not present

## 2019-11-24 DIAGNOSIS — S32592A Other specified fracture of left pubis, initial encounter for closed fracture: Secondary | ICD-10-CM | POA: Diagnosis not present

## 2019-11-24 DIAGNOSIS — B9620 Unspecified Escherichia coli [E. coli] as the cause of diseases classified elsewhere: Secondary | ICD-10-CM | POA: Diagnosis present

## 2019-11-24 DIAGNOSIS — S42111A Displaced fracture of body of scapula, right shoulder, initial encounter for closed fracture: Secondary | ICD-10-CM | POA: Diagnosis present

## 2019-11-24 DIAGNOSIS — M2550 Pain in unspecified joint: Secondary | ICD-10-CM | POA: Diagnosis not present

## 2019-11-24 DIAGNOSIS — Z20822 Contact with and (suspected) exposure to covid-19: Secondary | ICD-10-CM | POA: Diagnosis present

## 2019-11-24 DIAGNOSIS — S32512A Fracture of superior rim of left pubis, initial encounter for closed fracture: Secondary | ICD-10-CM | POA: Diagnosis present

## 2019-11-24 DIAGNOSIS — M4856XA Collapsed vertebra, not elsewhere classified, lumbar region, initial encounter for fracture: Secondary | ICD-10-CM | POA: Diagnosis present

## 2019-11-24 DIAGNOSIS — K5792 Diverticulitis of intestine, part unspecified, without perforation or abscess without bleeding: Secondary | ICD-10-CM | POA: Diagnosis present

## 2019-11-24 DIAGNOSIS — W010XXA Fall on same level from slipping, tripping and stumbling without subsequent striking against object, initial encounter: Secondary | ICD-10-CM | POA: Diagnosis present

## 2019-11-24 LAB — CBC
HCT: 29.4 % — ABNORMAL LOW (ref 36.0–46.0)
Hemoglobin: 9.3 g/dL — ABNORMAL LOW (ref 12.0–15.0)
MCH: 29.2 pg (ref 26.0–34.0)
MCHC: 31.6 g/dL (ref 30.0–36.0)
MCV: 92.5 fL (ref 80.0–100.0)
Platelets: 221 10*3/uL (ref 150–400)
RBC: 3.18 MIL/uL — ABNORMAL LOW (ref 3.87–5.11)
RDW: 13.9 % (ref 11.5–15.5)
WBC: 8.7 10*3/uL (ref 4.0–10.5)
nRBC: 0 % (ref 0.0–0.2)

## 2019-11-24 LAB — BASIC METABOLIC PANEL
Anion gap: 10 (ref 5–15)
BUN: 40 mg/dL — ABNORMAL HIGH (ref 8–23)
CO2: 20 mmol/L — ABNORMAL LOW (ref 22–32)
Calcium: 7.8 mg/dL — ABNORMAL LOW (ref 8.9–10.3)
Chloride: 104 mmol/L (ref 98–111)
Creatinine, Ser: 1.65 mg/dL — ABNORMAL HIGH (ref 0.44–1.00)
GFR, Estimated: 28 mL/min — ABNORMAL LOW (ref >60)
Glucose, Bld: 95 mg/dL (ref 70–99)
Potassium: 4 mmol/L (ref 3.5–5.1)
Sodium: 134 mmol/L — ABNORMAL LOW (ref 135–145)

## 2019-11-24 LAB — BLOOD GAS, VENOUS
Acid-base deficit: 1.7 mmol/L (ref 0.0–2.0)
Bicarbonate: 22.4 mmol/L (ref 20.0–28.0)
O2 Saturation: 70.8 %
Patient temperature: 98.6
pCO2, Ven: 37.5 mmHg — ABNORMAL LOW (ref 44.0–60.0)
pH, Ven: 7.393 (ref 7.250–7.430)
pO2, Ven: 39 mmHg (ref 32.0–45.0)

## 2019-11-24 LAB — URINE CULTURE: Culture: >=100000 — AB

## 2019-11-24 MED ORDER — ACETAMINOPHEN 500 MG PO TABS
1000.0000 mg | ORAL_TABLET | Freq: Three times a day (TID) | ORAL | Status: DC
  Administered 2019-11-24 – 2019-11-27 (×5): 1000 mg via ORAL
  Filled 2019-11-24 (×6): qty 2

## 2019-11-24 MED ORDER — MORPHINE SULFATE (PF) 2 MG/ML IV SOLN: 0.5000 mg | INTRAVENOUS | Status: DC | PRN

## 2019-11-24 MED ORDER — OXYCODONE HCL 5 MG PO TABS: 2.5000 mg | ORAL_TABLET | ORAL | Status: DC | PRN

## 2019-11-24 MED ORDER — OXYCODONE HCL 5 MG PO TABS
5.0000 mg | ORAL_TABLET | ORAL | Status: DC | PRN
  Administered 2019-11-24 – 2019-11-29 (×12): 5 mg via ORAL
  Filled 2019-11-24 (×13): qty 1

## 2019-11-24 NOTE — Progress Notes (Signed)
PROGRESS NOTE    [name]  DJT:[id number] DOB: [birth year] DOA: 11/22/2019 PCP: [name], DO  Chief Complaint  Patient presents with   Fall   Brief Narrative:  Per HPI Sara Russell is Sara Russell 86 y.o. female with PMH significant for stroke in 2019 with residual right upper extremity weakness, expressive aphasia; hypertension, hyperlipidemia, persistent Lucilla Petrenko. fib, DVT/PE, diverticulosis, ulcerative colitis. Patient is alert, awake and able to listen to conversation but has significant expressive aphasia.  Her son at bedside helping with the history. Since her stroke in 2019, patient has been living in an assisted living facility.  At baseline, she has expressive aphasia and weakness of right upper extremity but has no dysphagia and is able to ambulate with Glorious Flicker walker.  In respect to patient's wish, she is DNR/DNI and does not want any aggressive measures. 10/9, in the morning while trying to open the refrigerator, patient lost balance and fell on the floor.  Staff attended immediately and did not notice any major injury.  Her son visited in the afternoon and at that time, she complained of pain in the right shoulder and left hip. X-rays were done at the facility and were negative. Her pain got worse in the next 48 hours and hence sent to the ED.  She was seen at ED in [facility]. Multiple imagings were obtained.  Significant findings being right scapula fracture, left pubic bone fracture and chronic compression fracture of L1 vertebrae.  ED physician discussed the case with orthopedics on-call Dr. Rolena Infante who recommended nonsurgical conservative management. Patient was accepted for admission last night but because of bed unavailability, patient arrived this afternoon only.  At the time of my evaluation, patient was alert, awake, comfortable.  Son was at bedside and help all with the history.  Assessment & Plan:   Active Problems:   Fall  Acute Metabolic  Encephalopathy On my evaluation this morning she was lethargic, aroused to voice minimally, but also painful stimuli.  VBG with out hypercarbia.  Since my interaction with her, she's woken up per discussion with nursing.  Of note, she has expressive aphasia. Continue to follow. Delirium precautions Caution with potentially sedating meds  Multiple fractures secondary to fall -Multiple imagings were obtained.  Significant findings being right scapular fracture, left superior and inferior pubic rami fxs and severe compression fracture of L1 vertebrae with progression since 2017. -appreciate ortho recommendations - WBAT for superior and inferior L pubic ramus fx.  She'll be at increased risk of falls would benefit from therapy.  For R scapular fx, recommending WBAT, no need for formal PT or OT for the shoulder other than ADL work. -We will order for sling for right arm fracture. -scheduled APAP, oxycodone prn, morphine for breakthrough  -PT eval ordered.  Community Acquired Pneumonia Early Acute Diverticulitis E Coli UTI Pt with R basilar airspace opacity with associated trace effusion suggestive of infection/inflammation Also has changes suggestive of early diverticulitis in the distal descending colon E. Coli UTI sensitive to ceftriaxone/ancef Will cover above possible infections with ceftriaxone/flagyl.  Plan for 5 day course. SLP evaluation Needs repeat CXR outpatient in 3-4 weeks to ensure resolution  Cardiovascular issues:  Stroke in 2019 with residual right upper extremity weakness, expressive aphasia hypertension, hyperlipidemia,  persistent Shaolin Armas. Fib -Resume home meds which include Coreg 6.25 mg twice daily, Amiodarone 100 mg daily, aspirin 81 mg daily and Lipitor 10 mg daily -Continue monitor in telemetry.  Hypothyroidism -Synthroid 75 mcg daily  Abnormal CT abdomen finding - Large L adnexal cyst, thought to be benign given long term stability, follow outpatient   Other home  meds include Protonix 40 mg daily, Sodium chloride tablet 1 g twice daily  DVT prophylaxis:SCD Code Status: DNR Family Communication: son, [name] at bedsdie Disposition:   Status is: Observation  The patient will require care spanning > 2 midnights and should be moved to inpatient because: Inpatient level of care appropriate due to severity of illness  Dispo: The patient is from: SNF              Anticipated d/c is to: SNF              Anticipated d/c date is: > 3 days              Patient currently is not medically stable to d/c.  Consultants:   orthopedics  Procedures:   none  Antimicrobials:  Anti-infectives (From admission, onward)   Start     Dose/Rate Route Frequency Ordered Stop   11/22/19 2030  cefTRIAXone (ROCEPHIN) 2 g in sodium chloride 0.9 % 100 mL IVPB        2 g 200 mL/hr over 30 Minutes Intravenous Every 24 hours 11/22/19 2022     11/22/19 2030  metroNIDAZOLE (FLAGYL) IVPB 500 mg        500 mg 100 mL/hr over 60 Minutes Intravenous Every 8 hours 11/22/19 2022        Subjective: Lethargic, awakens to voice, then sternal rub  Objective: Vitals:   11/24/19 0124 11/24/19 0434 11/24/19 1212 11/24/19 1416  BP: 108/75 125/85 (!) 144/65 (!) 94/53  Pulse: 75 69 64 (!) 56  Resp: 16 18 16    Temp: 98.7 F (37.1 C) 98.1 F (36.7 C) 97.8 F (36.6 C)   TempSrc: Oral Oral Oral   SpO2: 95% 97% 98% 97%  Height:        Intake/Output Summary (Last 24 hours) at 11/24/2019 1459 Last data filed at 11/24/2019 1226 Gross per 24 hour  Intake 60 ml  Output 400 ml  Net -340 ml   There were no vitals filed for this visit.  Examination:  General exam: Appears calm and comfortable  Respiratory system: Clear to auscultation. Respiratory effort normal. Cardiovascular system: S1 & S2 heard, RRR Gastrointestinal system: Abdomen is nondistended, soft and nontender.  Central nervous system: lethargic, awakens briefly to voice, then pushes me away after sternal rub, moving  all extremities Extremities: no LEE Skin: No rashes, lesions or ulcers    Data Reviewed: I have personally reviewed following labs and imaging studies  CBC: Recent Labs  Lab 11/22/19 1844 11/24/19 0559  WBC 14.5* 8.7  NEUTROABS 12.6*  --   HGB 10.8* 9.3*  HCT 33.8* 29.4*  MCV 91.8 92.5  PLT 223 528    Basic Metabolic Panel: Recent Labs  Lab 11/22/19 1844 11/24/19 0559  NA 136 134*  K 4.9 4.0  CL 103 104  CO2 24 20*  GLUCOSE 117* 95  BUN 33* 40*  CREATININE 1.69* 1.65*  CALCIUM 8.5* 7.8*    GFR: CrCl cannot be calculated (Unknown ideal weight.).  Liver Function Tests: Recent Labs  Lab 11/22/19 1844  AST 21  ALT 15  ALKPHOS 117  BILITOT 0.3  PROT 6.9  ALBUMIN 3.1*    CBG: No results for input(s): GLUCAP in the last 168 hours.   Recent Results (from the past 240 hour(s))  Respiratory Panel by RT PCR (Flu Willian Donson&B, Covid) -  Nasopharyngeal Swab     Status: None   Collection Time: 11/22/19  6:45 PM   Specimen: Nasopharyngeal Swab  Result Value Ref Range Status   SARS Coronavirus 2 by RT PCR NEGATIVE NEGATIVE Final    Comment: (NOTE) SARS-CoV-2 target nucleic acids are NOT DETECTED.  The SARS-CoV-2 RNA is generally detectable in upper respiratoy specimens during the acute phase of infection. The lowest concentration of SARS-CoV-2 viral copies this assay can detect is 131 copies/mL. Garlon Tuggle negative result does not preclude SARS-Cov-2 infection and should not be used as the sole basis for treatment or other patient management decisions. Kamarii Buren negative result may occur with  improper specimen collection/handling, submission of specimen other than nasopharyngeal swab, presence of viral mutation(s) within the areas targeted by this assay, and inadequate number of viral copies (<131 copies/mL). Shyra Emile negative result must be combined with clinical observations, patient history, and epidemiological information. The expected result is Negative.  Fact Sheet for Patients:   PinkCheek.be  Fact Sheet for Healthcare Providers:  GravelBags.it  This test is no t yet approved or cleared by the [place] FDA and  has been authorized for detection and/or diagnosis of SARS-CoV-2 by FDA under an Emergency Use Authorization (EUA). This EUA will remain  in effect (meaning this test can be used) for the duration of the COVID-19 declaration under Section 564(b)(1) of the Act, 21 U.S.C. section 360bbb-3(b)(1), unless the authorization is terminated or revoked sooner.     Influenza Sarra Rachels by PCR NEGATIVE NEGATIVE Final   Influenza B by PCR NEGATIVE NEGATIVE Final    Comment: (NOTE) The Xpert Xpress SARS-CoV-2/FLU/RSV assay is intended as an aid in  the diagnosis of influenza from Nasopharyngeal swab specimens and  should not be used as Markell Schrier sole basis for treatment. Nasal washings and  aspirates are unacceptable for Xpert Xpress SARS-CoV-2/FLU/RSV  testing.  Fact Sheet for Patients: PinkCheek.be  Fact Sheet for Healthcare Providers: GravelBags.it  This test is not yet approved or cleared by the [place] FDA and  has been authorized for detection and/or diagnosis of SARS-CoV-2 by  FDA under an Emergency Use Authorization (EUA). This EUA will remain  in effect (meaning this test can be used) for the duration of the  Covid-19 declaration under Section 564(b)(1) of the Act, 21  U.S.C. section 360bbb-3(b)(1), unless the authorization is  terminated or revoked. Performed at [facility], [address] [place], [place] [address]   Culture, blood (routine x 2)     Status: None (Preliminary result)   Collection Time: 11/22/19  8:53 PM   Specimen: BLOOD  Result Value Ref Range Status   Specimen Description   Final    BLOOD BLOOD LEFT FOREARM Performed at [facility], [address] [place], [place] [address]    Special  Requests   Final    BOTTLES DRAWN AEROBIC AND ANAEROBIC Blood Culture adequate volume Performed at [facility], [address] [place], [place] [address]    Culture   Final    NO GROWTH 2 DAYS Performed at [facility], [place] [address] [place], [place] [address]    Report Status PENDING  Incomplete  Culture, blood (routine x 2)     Status: None (Preliminary result)   Collection Time: 11/22/19  8:56 PM   Specimen: BLOOD  Result Value Ref Range Status   Specimen Description   Final    BLOOD BLOOD RIGHT FOREARM Performed at [facility]  [address], [address] [place], [place] [address]    Special Requests   Final    BOTTLES DRAWN AEROBIC AND ANAEROBIC Blood Culture adequate volume Performed at [facility], [address] [place], [place] [address]    Culture   Final    NO GROWTH 2 DAYS Performed at [facility], [place] [address] [place], [place] [address]    Report Status PENDING  Incomplete  Culture, Urine     Status: Abnormal   Collection Time: 11/22/19  9:10 PM   Specimen: Urine, Random  Result Value Ref Range Status   Specimen Description   Final    URINE, RANDOM Performed at [facility], [address] [place],  [address]    Special Requests   Final    NONE Performed at [facility], [address] [place], [place] [address]    Culture >=100,000 COLONIES/mL ESCHERICHIA COLI (Reagen Goates)  Final   Report Status 11/24/2019 FINAL  Final   Organism ID, Bacteria ESCHERICHIA COLI (Abisola Carrero)  Final      Susceptibility   Escherichia coli - MIC*    AMPICILLIN >=32 RESISTANT Resistant     CEFAZOLIN <=4 SENSITIVE Sensitive     CEFTRIAXONE <=0.25 SENSITIVE Sensitive     CIPROFLOXACIN 0.5 SENSITIVE Sensitive     GENTAMICIN <=1 SENSITIVE Sensitive     IMIPENEM <=0.25 SENSITIVE Sensitive     NITROFURANTOIN <=16 SENSITIVE Sensitive     TRIMETH/SULFA >=320 RESISTANT Resistant     AMPICILLIN/SULBACTAM 16 INTERMEDIATE  Intermediate     PIP/TAZO <=4 SENSITIVE Sensitive     * >=100,000 COLONIES/mL ESCHERICHIA COLI         Radiology Studies: DG Pelvis 1-2 Views  Result Date: 11/22/2019 CLINICAL DATA:  Recent fall with pelvic pain, initial encounter EXAM: PELVIS - 1-2 VIEW COMPARISON:  08/03/2017 FINDINGS: Pelvic ring is intact. Fragmentation is noted along the superior pubic ramus on the left. No other definitive fracture is seen. No soft tissue abnormality is noted. IMPRESSION: Left superior pubic ramus fracture. No other focal abnormality is noted. Electronically Signed   By: Inez Catalina M.D.   On: 11/22/2019 17:15   DG Shoulder Right  Result Date: 11/22/2019 CLINICAL DATA:  Recent fall with right shoulder pain, initial encounter EXAM: RIGHT SHOULDER - 2+ VIEW COMPARISON:  None. FINDINGS: Degenerative changes of the acromioclavicular joint and glenohumeral joint are seen. The is high-riding articulating with the acromion consistent with chronic rotator cuff injury. Bony density is noted anterior to the humeral head on the Y-view which suggests Talaya Lamprecht coracoid fracture. Additionally fragmentation in the scapular body is noted. IMPRESSION: Changes consistent with scapular fracture in the body as well as at the coracoid process. Cross-sectional imaging may be helpful for further evaluation. Degenerative changes are noted about the shoulder joint with findings of Maryanne Huneycutt chronic rotator cuff injury. Electronically Signed   By: Inez Catalina M.D.   On: 11/22/2019 17:00   DG Elbow Complete Right  Result Date: 11/22/2019 CLINICAL DATA:  Fall 2 days ago with elbow pain, initial encounter EXAM: RIGHT ELBOW - COMPLETE 3+ VIEW COMPARISON:  None. FINDINGS: No acute fracture or dislocation is noted. Small joint effusion is noted with elevation of the anterior fat pad. Mild soft tissue swelling is noted posteriorly. IMPRESSION: Small joint effusion without acute fracture. Mild soft tissue swelling is noted posteriorly. Electronically  Signed   By: Inez Catalina M.D.   On: 11/22/2019 17:05  DG Ankle Complete Right  Result Date: 11/22/2019 CLINICAL DATA:  Recent fall with right ankle pain, initial encounter EXAM: RIGHT ANKLE - COMPLETE 3+ VIEW COMPARISON:  None. FINDINGS: Generalized osteopenia is noted. No soft tissue abnormality is seen. No acute fracture or dislocation is noted. Tarsal degenerative changes are seen. IMPRESSION: Chronic changes without acute abnormality. Electronically Signed   By: Inez Catalina M.D.   On: 11/22/2019 17:12   CT Head Wo Contrast  Result Date: 11/22/2019 CLINICAL DATA:  86 year old female status post fall 3 days ago with pain. EXAM: CT HEAD WITHOUT CONTRAST TECHNIQUE: Contiguous axial images were obtained from the base of the skull through the vertex without intravenous contrast. COMPARISON:  Brain MRI 08/03/2017.  Head CT 10/06/2017. FINDINGS: Brain: Cerebral volume is not significantly changed since 2019. Patchy and confluent bilateral white matter hypodensity appears increased. No midline shift, ventriculomegaly, mass effect, evidence of mass lesion, intracranial hemorrhage or evidence of cortically based acute infarction. Vascular: Calcified atherosclerosis at the skull base. No suspicious intracranial vascular hyperdensity. Skull: No acute osseous abnormality identified. Sinuses/Orbits: New low-density fluid or mucosal thickening in the right posterior ethmoid, sphenoid sinuses. Other visible sinuses, tympanic cavities and mastoids remain well pneumatized. Other: No acute orbit or scalp soft tissue finding. IMPRESSION: 1. No acute intracranial abnormality or recent traumatic injury identified. 2. Progressed chronic white matter disease since 2019. 3. New mild posterior ethmoid and sphenoid sinus inflammation. Electronically Signed   By: Genevie Ann M.D.   On: 11/22/2019 19:55   CT Lumbar Spine Wo Contrast  Result Date: 11/22/2019 CLINICAL DATA:  Fall.  Severe back pain EXAM: CT LUMBAR SPINE WITHOUT  CONTRAST TECHNIQUE: Multidetector CT imaging of the lumbar spine was performed without intravenous contrast administration. Multiplanar CT image reconstructions were also generated. COMPARISON:  Lumbar spine radiographs 02/23/2015 FINDINGS: Segmentation: Normal Alignment: Moderate levoscoliosis.  Normal sagittal alignment. Vertebrae: Severe compression fracture L1 vertebral body. This has progressed since the prior study in 2017 when there was Patria Warzecha mild fracture of the superior endplate of L1. Mild bony retropulsion of the superior endplate of L1 into the canal causing mild spinal stenosis. No other fracture Paraspinal and other soft tissues: No significant paraspinous edema at the L1 fracture. Atherosclerotic aorta without aneurysm Cholelithiasis Large fluid collection in the left pelvis measuring 4.5 x 7 cm. This appears to be adnexal in location. Disc levels: Multilevel disc and facet degeneration throughout the lumbar spine. Mild spinal stenosis at L1 due to posterior retropulsion of superior endplate. No other areas of significant spinal stenosis. IMPRESSION: 1. Severe compression fracture of L1, with progression since 2017. This could be Tyria Springer superimposed acute fracture or chronic fracture. Mild spinal stenosis due to retropulsion into the canal. No other fracture. 2. Cholelithiasis 3. 4.5 x 7 mm fluid collection left pelvis likely from the adnexa. Because this lesion is not adequately characterized, prompt [place] is recommended for further evaluation. Note: This recommendation does not apply to premenarchal patients and to those with increased risk (genetic, family history, elevated tumor markers or other high-risk factors) of ovarian cancer. Reference: JACR 2020 Feb; 17(2):248-254 Electronically Signed   By: Franchot Gallo M.D.   On: 11/22/2019 16:17   CT PELVIS WO CONTRAST  Result Date: 11/22/2019 CLINICAL DATA:  Fall several days ago with pelvic pain, initial encounter EXAM: CT PELVIS WITHOUT CONTRAST  TECHNIQUE: Multidetector CT imaging of the pelvis was performed following the standard protocol without intravenous contrast. COMPARISON:  CT of the right femur from earlier in the same  day, CT from 09/18/2014. FINDINGS: Urinary Tract:  Bladder is partially distended. Bowel: Scattered feet fecal material is noted throughout the colon. Mild diverticular changes noted. Some very mild inflammatory changes are noted surrounding the distal descending colon which may represent some very mild diverticulitis. Vascular/Lymphatic: Atherosclerotic calcifications are noted without aneurysmal dilatation. No sizable lymphadenopathy is noted. Reproductive: Uterus is within normal limits. Scattered fluid attenuation lesions are noted within the adnexa. The largest of these lies on the left measuring approximately 6.8 cm. This is similar to that seen on prior CT from 2016 and consistent with Cobin Cadavid benign etiology. Other:  None. Musculoskeletal: Degenerative changes of lumbar spine are noted. The sacrum appears within normal limits. Old healed fractures of the superior and inferior pubic rami on the right are noted. There are changes of acute fracture adjacent to the pubic symphysis in both the inferior and superior pubic rami. This corresponds with that seen on prior CT as well as the recent plain film evaluation. Some adjacent stranding is noted consistent with localized edema/hemorrhage related to the injury. No focal confluent hematoma is seen. IMPRESSION: Fractures of the left superior and inferior pubic rami adjacent to the pubic symphysis as described above similar to that seen on prior CT of the femur. Large left adnexal cyst similar to that noted on Melaysia Streed prior CT from 2016. Given its long-term stability it is felt to be benign in etiology. Changes suggestive of early diverticulitis in the distal descending colon. Electronically Signed   By: Inez Catalina M.D.   On: 11/22/2019 19:58   CT FEMUR RIGHT WO CONTRAST  Result Date:  11/22/2019 CLINICAL DATA:  Severe right thigh pain after fall on Saturday. EXAM: CT OF THE LOWER RIGHT EXTREMITY WITHOUT CONTRAST TECHNIQUE: Multidetector CT imaging of the right lower extremity was performed according to the standard protocol. COMPARISON:  Right femur x-rays dated August 03, 2017. FINDINGS: Bones/Joint/Cartilage Partially visualized acute parasymphyseal fractures of the left superior and inferior pubic rami. Old healed fractures of the right superior and inferior pubic rami. No dislocation. Serpiginous subchondral sclerosis in the medial tibial plateau likely represents Eain Mullendore Sharifa Bucholz bone infarct. Mild tricompartmental degenerative changes of the knee. The right hip joint space is preserved. Osteopenia. No joint effusion. Ligaments Ligaments are suboptimally evaluated by CT. Muscles and Tendons Grossly intact. Atrophy of the semimembranosus and biceps femoris muscles. Soft tissue Small amount of ill-defined high-density material in the superficial fat of the right hip along the inferior aspect of the greater trochanter, likely hemorrhage. No soft tissue mass. 6.9 x 5.2 cm simple appearing cystic lesion arising from the left ovary. IMPRESSION: 1. Partially visualized acute parasymphyseal fractures of the left superior and inferior pubic rami. Old healed fractures of the right superior and inferior pubic rami. 2. Small ill-defined hematoma in the superficial fat of the right hip along the inferior aspect of the greater trochanter. No underlying fracture. 3. 6.9 cm cystic lesion arising from the left ovary. Because this lesion is not adequately characterized, prompt [place] is recommended for further evaluation. Note: This recommendation does not apply to premenarchal patients and to those with increased risk (genetic, family history, elevated tumor markers or other high-risk factors) of ovarian cancer. Reference: JACR 2020 Feb; 17(2):248-254 Electronically Signed   By: Titus Dubin M.D.   On: 11/22/2019 16:34    DG Chest Port 1 View  Result Date: 11/22/2019 CLINICAL DATA:  Admission for pneumonia, known fractures of the shoulder and pelvis. EXAM: PORTABLE CHEST 1 VIEW COMPARISON:  Chest x-ray  02/11/2018, CT chest 01/31/2017 FINDINGS: Wireless cardiac device overlies the left chest. The heart size and mediastinal contours are unchanged. Redemonstration of Dalyla Chui large lucency overlying the mediastinum consistent with known hiatal hernia. Aortic arch calcification. Right base airspace opacity as well as right trace pleural effusion. No definite consolidation within the left lung with persistent passive atelectasis of the lingula. No pulmonary edema. No left pleural effusion. No pneumothorax. No acute displaced fracture identified. Per history, there is Caliope Ruppert known shoulder fracture does not visualized on this study and may be collimated off view. IMPRESSION: 1. Right basilar airspace opacity with associated trace pleural effusion suggestive of infection/inflammation. Followup PA and lateral chest X-ray is recommended in 3-4 weeks following trial of antibiotic therapy to ensure resolution and exclude underlying malignancy. 2. Large hiatal hernia. 3.  Aortic Atherosclerosis (ICD10-I70.0). Electronically Signed   By: Iven Finn M.D.   On: 11/22/2019 20:48   DG Knee Complete 4 Views Right  Result Date: 11/22/2019 CLINICAL DATA:  Recent fall with right knee pain, initial encounter EXAM: RIGHT KNEE - COMPLETE 4+ VIEW COMPARISON:  08/03/2017 FINDINGS: No acute fracture or dislocation is noted. Calcification in the medial tibial plateau is noted without acute abnormality. This is stable from the prior exam. No acute abnormality noted. IMPRESSION: No acute abnormality noted. Electronically Signed   By: Inez Catalina M.D.   On: 11/22/2019 17:09        Scheduled Meds:  acetaminophen  1,000 mg Oral Q8H   amiodarone  100 mg Oral Daily   aspirin EC  81 mg Oral Daily   atorvastatin  10 mg Oral Daily   carvedilol   6.25 mg Oral BID WC   docusate sodium  100 mg Oral QODAY   influenza vaccine adjuvanted  0.5 mL Intramuscular Tomorrow-1000   levothyroxine  75 mcg Oral QAC breakfast   pantoprazole  40 mg Oral Daily   senna  1 tablet Oral BID   tiZANidine  2 mg Oral TID WC & HS   Continuous Infusions:  cefTRIAXone (ROCEPHIN)  IV 2 g (11/23/19 2051)   metronidazole 500 mg (11/24/19 1220)     LOS: 0 days    Time spent: 40 min    Fayrene Helper, MD [facility]   To contact the attending provider between 7A-7P or the covering provider during after hours 7P-7A, please log into the web site [email or web address] and access using universal Todd password for that web site. If you do not have the password, please call the hospital operator.  11/24/2019, 2:59 PM

## 2019-11-24 NOTE — Plan of Care (Signed)
  Problem: Activity: Goal: Risk for activity intolerance will decrease Outcome: Progressing   Problem: Nutrition: Goal: Adequate nutrition will be maintained Outcome: Progressing   Problem: Pain Managment: Goal: General experience of comfort will improve Outcome: Progressing   Problem: Skin Integrity: Goal: Risk for impaired skin integrity will decrease Outcome: Progressing

## 2019-11-24 NOTE — Consult Note (Signed)
Reason for Consult:s/p fall with right shoulder and pelvis injury Referring Physician: Florene Glen MD  Sara Russell is an 86 y.o. female.  HPI: 86 yo female who had an unwitnessed fall and presents complaining of right shoulder pain and left groin and hip pain.  Patient seen in the ED and XRAYS were performed which show a slightly displaced left superior and inferior pubic ramus fracture as well as right scapula fractures. Patient is comfortable when seated but has pain with AROM of the right shoulder and during transfers bed to chair. She does report baseline shoulder loss of function that she has had for some time.  She is in assisted living at [facility] Her son Sara Russell is here at the bedside.  Past Medical History:  Diagnosis Date  . Abdominal mass   . AKI (acute kidney injury) ([place]) 09/15/2014  . Atrial fibrillation ([place])   . Colitis, ulcerative ([place])   . Cough    WHITE SPUTUM FOR 2 WEEKS, NO FEVER  . Degenerative joint disease   . Diverticulosis   . DVT (deep venous thrombosis) ([place]) 2009   BOTH LUNGS  . GERD (gastroesophageal reflux disease)   . Hyperlipidemia   . Hyperplastic colon polyp   . Hypertension   . Hypothyroidism   . Iron deficiency anemia    ON IRON AT TIMES  . PE (pulmonary embolism) 2009  . Persistent atrial fibrillation ([place])   . Positive H. pylori test 01/20/96  . S/P dilatation of esophageal stricture 1992  . Skin cancer    h/o basal and squamous skin cancer--> removed  . Stroke [facility])     Past Surgical History:  Procedure Laterality Date  . APPENDECTOMY    . BREAST EXCISIONAL BIOPSY Left 1992   benign  . BREAST LUMPECTOMY     benign  . DILATION AND CURETTAGE OF UTERUS     for endometrial polyps  . ENDOSCOPIC RETROGRADE CHOLANGIOPANCREATOGRAPHY (ERCP) WITH PROPOFOL N/A 03/23/2015   Procedure: ENDOSCOPIC RETROGRADE CHOLANGIOPANCREATOGRAPHY (ERCP) WITH PROPOFOL;  Surgeon: Milus Banister, MD;  Location: [facility];  Service: Endoscopy;  Laterality: N/A;   . JOINT REPLACEMENT    . LOOP RECORDER INSERTION N/A 10/14/2017   Procedure: LOOP RECORDER INSERTION;  Surgeon: Thompson Grayer, MD;  Location: [facility];  Service: Cardiovascular;  Laterality: N/A;  . ORIF ANKLE FRACTURE Left    X 2  . REPLACEMENT TOTAL KNEE     left  . SURGERY DONE TO REMOVE LEFT ANKLE HARDWARE LATER    . TONSILLECTOMY      Family History  Problem Relation Age of Onset  . Esophageal cancer Brother   . Diverticulitis Brother   . Heart disease Mother   . Stroke Mother   . Dementia Mother   . Heart attack Mother   . Heart disease Father   . Atrial fibrillation Father   . Breast cancer Maternal Aunt   . Colon cancer Neg Hx     Social History:  reports that she quit smoking about 58 years ago. Her smoking use included cigarettes. She started smoking about 71 years ago. She has a 6.50 pack-year smoking history. She has never used smokeless tobacco. She reports current alcohol use of about 5.0 standard drinks of alcohol per week. She reports that she does not use drugs.  Allergies:  Allergies  Allergen Reactions  . Iohexol Hives  . Ivp Dye [Iodinated Diagnostic Agents] Hives    Was able to tolerate Contrast Media for CT scan.  Sara Russell [  Shellfish Allergy] Nausea And Vomiting    Projectile vomiting  . Shellfish-Derived Products Nausea And Vomiting  . Sulfa Antibiotics Hives    Medications: I have reviewed the patient's current medications.  Results for orders placed or performed during the hospital encounter of 11/22/19 (from the past 48 hour(s))  CBC with Differential/Platelet     Status: Abnormal   Collection Time: 11/22/19  6:44 PM  Result Value Ref Range   WBC 14.5 (H) 4.0 - 10.5 K/uL   RBC 3.68 (L) 3.87 - 5.11 MIL/uL   Hemoglobin 10.8 (L) 12.0 - 15.0 g/dL   HCT 33.8 (L) 36 - 46 %   MCV 91.8 80.0 - 100.0 fL   MCH 29.3 26.0 - 34.0 pg   MCHC 32.0 30.0 - 36.0 g/dL   RDW 13.9 11.5 - 15.5 %   Platelets 223 150 - 400 K/uL   nRBC 0.0 0.0 - 0.2 %    Neutrophils Relative % 86 %   Neutro Abs 12.6 (H) 1.7 - 7.7 K/uL   Lymphocytes Relative 5 %   Lymphs Abs 0.8 0.7 - 4.0 K/uL   Monocytes Relative 7 %   Monocytes Absolute 1.0 0.1 - 1.0 K/uL   Eosinophils Relative 1 %   Eosinophils Absolute 0.1 0.0 - 0.5 K/uL   Basophils Relative 0 %   Basophils Absolute 0.0 0.0 - 0.1 K/uL   Immature Granulocytes 1 %   Abs Immature Granulocytes 0.09 (H) 0.00 - 0.07 K/uL    Comment: Performed at [facility], [address] [place], [place] [address]  Comprehensive metabolic panel     Status: Abnormal   Collection Time: 11/22/19  6:44 PM  Result Value Ref Range   Sodium 136 135 - 145 mmol/L   Potassium 4.9 3.5 - 5.1 mmol/L   Chloride 103 98 - 111 mmol/L   CO2 24 22 - 32 mmol/L   Glucose, Bld 117 (H) 70 - 99 mg/dL    Comment: Glucose reference range applies only to samples taken after fasting for at least 8 hours.   BUN 33 (H) 8 - 23 mg/dL   Creatinine, Ser 1.69 (H) 0.44 - 1.00 mg/dL   Calcium 8.5 (L) 8.9 - 10.3 mg/dL   Total Protein 6.9 6.5 - 8.1 g/dL   Albumin 3.1 (L) 3.5 - 5.0 g/dL   AST 21 15 - 41 U/L   ALT 15 0 - 44 U/L   Alkaline Phosphatase 117 38 - 126 U/L   Total Bilirubin 0.3 0.3 - 1.2 mg/dL   GFR, Estimated 27 (L) >60 mL/min   Anion gap 9 5 - 15    Comment: Performed at [facility], [address] [place], [place] [address]  CK     Status: Abnormal   Collection Time: 11/22/19  6:44 PM  Result Value Ref Range   Total CK 32 (L) 38.0 - 234.0 U/L    Comment: Performed at [facility], [address] [place], [place] [address]  Respiratory Panel by RT PCR (Flu A&B, Covid) - Nasopharyngeal Swab     Status: None   Collection Time: 11/22/19  6:45 PM   Specimen: Nasopharyngeal Swab  Result Value Ref Range   SARS Coronavirus 2 by RT PCR NEGATIVE NEGATIVE    Comment: (NOTE) SARS-CoV-2 target nucleic acids are NOT DETECTED.  The SARS-CoV-2 RNA is generally detectable in upper respiratoy specimens  during the acute phase of infection. The lowest concentration of SARS-CoV-2 viral  copies this assay can detect is 131 copies/mL. A negative result does not preclude SARS-Cov-2 infection and should not be used as the sole basis for treatment or other patient management decisions. A negative result may occur with  improper specimen collection/handling, submission of specimen other than nasopharyngeal swab, presence of viral mutation(s) within the areas targeted by this assay, and inadequate number of viral copies (<131 copies/mL). A negative result must be combined with clinical observations, patient history, and epidemiological information. The expected result is Negative.  Fact Sheet for Patients:  PinkCheek.be  Fact Sheet for Healthcare Providers:  GravelBags.it  This test is no t yet approved or cleared by the [place] FDA and  has been authorized for detection and/or diagnosis of SARS-CoV-2 by FDA under an Emergency Use Authorization (EUA). This EUA will remain  in effect (meaning this test can be used) for the duration of the COVID-19 declaration under Section 564(b)(1) of the Act, 21 U.S.C. section 360bbb-3(b)(1), unless the authorization is terminated or revoked sooner.     Influenza A by PCR NEGATIVE NEGATIVE   Influenza B by PCR NEGATIVE NEGATIVE    Comment: (NOTE) The Xpert Xpress SARS-CoV-2/FLU/RSV assay is intended as an aid in  the diagnosis of influenza from Nasopharyngeal swab specimens and  should not be used as a sole basis for treatment. Nasal washings and  aspirates are unacceptable for Xpert Xpress SARS-CoV-2/FLU/RSV  testing.  Fact Sheet for Patients: PinkCheek.be  Fact Sheet for Healthcare Providers: GravelBags.it  This test is not yet approved or cleared by the [place] FDA and  has been authorized for detection and/or diagnosis  of SARS-CoV-2 by  FDA under an Emergency Use Authorization (EUA). This EUA will remain  in effect (meaning this test can be used) for the duration of the  Covid-19 declaration under Section 564(b)(1) of the Act, 21  U.S.C. section 360bbb-3(b)(1), unless the authorization is  terminated or revoked. Performed at [facility], [address] [place], [place] [address]   Culture, blood (routine x 2)     Status: None (Preliminary result)   Collection Time: 11/22/19  8:53 PM   Specimen: BLOOD  Result Value Ref Range   Specimen Description      BLOOD BLOOD LEFT FOREARM Performed at [facility], [address] [place], [place] [address]    Special Requests      BOTTLES DRAWN AEROBIC AND ANAEROBIC Blood Culture adequate volume Performed at [facility], [address] [place], [place] [address]    Culture      NO GROWTH 2 DAYS Performed at [facility], [place] [address] [place], [place] [address]    Report Status PENDING   Culture, blood (routine x 2)     Status: None (Preliminary result)   Collection Time: 11/22/19  8:56 PM   Specimen: BLOOD  Result Value Ref Range   Specimen Description      BLOOD BLOOD RIGHT FOREARM Performed at [facility], [address] [place], [place] [address]    Special Requests      BOTTLES DRAWN AEROBIC AND ANAEROBIC Blood Culture adequate volume Performed at [facility], [address] [place], [place] [address]    Culture      NO GROWTH 2 DAYS Performed at [facility], [place] [address] [place], [place] [address]    Report Status PENDING   Urinalysis, Routine w reflex microscopic Urine,  Catheterized     Status: Abnormal   Collection Time: 11/22/19  9:10 PM  Result Value Ref Range   Color, Urine YELLOW YELLOW   APPearance CLOUDY (A) CLEAR   Specific Gravity, Urine 1.020 1.005 - 1.030   pH 6.0 5.0 - 8.0   Glucose, UA NEGATIVE NEGATIVE mg/dL   Hgb urine dipstick TRACE (A)  NEGATIVE   Bilirubin Urine NEGATIVE NEGATIVE   Ketones, ur NEGATIVE NEGATIVE mg/dL   Protein, ur 30 (A) NEGATIVE mg/dL   Nitrite POSITIVE (A) NEGATIVE   Leukocytes,Ua LARGE (A) NEGATIVE    Comment: Performed at [facility], [address] [place], [place] [address]  Culture, Urine     Status: Abnormal   Collection Time: 11/22/19  9:10 PM   Specimen: Urine, Random  Result Value Ref Range   Specimen Description      URINE, RANDOM Performed at [facility], [address] [place], [place] [address]    Special Requests      NONE Performed at [facility], [address] [place], [place] [address]    Culture >=100,000 COLONIES/mL ESCHERICHIA COLI (A)    Report Status 11/24/2019 FINAL    Organism ID, Bacteria ESCHERICHIA COLI (A)       Susceptibility   Escherichia coli - MIC*    AMPICILLIN >=32 RESISTANT Resistant     CEFAZOLIN <=4 SENSITIVE Sensitive     CEFTRIAXONE <=0.25 SENSITIVE Sensitive     CIPROFLOXACIN 0.5 SENSITIVE Sensitive     GENTAMICIN <=1 SENSITIVE Sensitive     IMIPENEM <=0.25 SENSITIVE Sensitive     NITROFURANTOIN <=16 SENSITIVE Sensitive     TRIMETH/SULFA >=320 RESISTANT Resistant     AMPICILLIN/SULBACTAM 16 INTERMEDIATE Intermediate     PIP/TAZO <=4 SENSITIVE Sensitive     * >=100,000 COLONIES/mL ESCHERICHIA COLI  Urinalysis, Microscopic (reflex)     Status: Abnormal   Collection Time: 11/22/19  9:10 PM  Result Value Ref Range   RBC / HPF 0-5 0 - 5 RBC/hpf   WBC, UA >50 0 - 5 WBC/hpf   Bacteria, UA MANY (A) NONE SEEN   Squamous Epithelial / LPF 6-10 0 - 5    Comment: Performed at [facility], [address] [place], [place] [address]  Basic metabolic panel     Status: Abnormal   Collection Time: 11/24/19  5:59 AM  Result Value Ref Range   Sodium 134 (L) 135 - 145 mmol/L   Potassium 4.0 3.5 - 5.1 mmol/L   Chloride 104 98 - 111 mmol/L   CO2 20 (L) 22 - 32 mmol/L   Glucose, Bld 95 70 - 99 mg/dL    Comment:  Glucose reference range applies only to samples taken after fasting for at least 8 hours.   BUN 40 (H) 8 - 23 mg/dL   Creatinine, Ser 1.65 (H) 0.44 - 1.00 mg/dL   Calcium 7.8 (L) 8.9 - 10.3 mg/dL   GFR, Estimated 28 (L) >60 mL/min   Anion gap 10 5 - 15    Comment: Performed at [facility], [place] [address], [place], [place] [address]  CBC     Status: Abnormal   Collection Time: 11/24/19  5:59 AM  Result Value Ref Range   WBC 8.7 4.0 - 10.5 K/uL   RBC 3.18 (L) 3.87 - 5.11 MIL/uL   Hemoglobin 9.3 (L) 12.0 - 15.0 g/dL   HCT 29.4 (L) 36 - 46 %  MCV 92.5 80.0 - 100.0 fL   MCH 29.2 26.0 - 34.0 pg   MCHC 31.6 30.0 - 36.0 g/dL   RDW 13.9 11.5 - 15.5 %   Platelets 221 150 - 400 K/uL   nRBC 0.0 0.0 - 0.2 %    Comment: Performed at [facility], [place] [address], [place], [place] [address]  Blood gas, venous     Status: Abnormal   Collection Time: 11/24/19 11:41 AM  Result Value Ref Range   pH, Ven 7.393 7.25 - 7.43   pCO2, Ven 37.5 (L) 44 - 60 mmHg   pO2, Ven 39.0 32 - 45 mmHg   Bicarbonate 22.4 20.0 - 28.0 mmol/L   Acid-base deficit 1.7 0.0 - 2.0 mmol/L   O2 Saturation 70.8 %   Patient temperature 98.6     Comment: Performed at [facility], [place] [address] [place],  [address]    DG Pelvis 1-2 Views  Result Date: 11/22/2019 CLINICAL DATA:  Recent fall with pelvic pain, initial encounter EXAM: PELVIS - 1-2 VIEW COMPARISON:  08/03/2017 FINDINGS: Pelvic ring is intact. Fragmentation is noted along the superior pubic ramus on the left. No other definitive fracture is seen. No soft tissue abnormality is noted. IMPRESSION: Left superior pubic ramus fracture. No other focal abnormality is noted. Electronically Signed   By: Inez Catalina M.D.   On: 11/22/2019 17:15   DG Shoulder Right  Result Date: 11/22/2019 CLINICAL DATA:  Recent fall with right shoulder pain, initial encounter EXAM: RIGHT SHOULDER - 2+ VIEW COMPARISON:  None.  FINDINGS: Degenerative changes of the acromioclavicular joint and glenohumeral joint are seen. The is high-riding articulating with the acromion consistent with chronic rotator cuff injury. Bony density is noted anterior to the humeral head on the Y-view which suggests a coracoid fracture. Additionally fragmentation in the scapular body is noted. IMPRESSION: Changes consistent with scapular fracture in the body as well as at the coracoid process. Cross-sectional imaging may be helpful for further evaluation. Degenerative changes are noted about the shoulder joint with findings of a chronic rotator cuff injury. Electronically Signed   By: Inez Catalina M.D.   On: 11/22/2019 17:00   DG Elbow Complete Right  Result Date: 11/22/2019 CLINICAL DATA:  Fall 2 days ago with elbow pain, initial encounter EXAM: RIGHT ELBOW - COMPLETE 3+ VIEW COMPARISON:  None. FINDINGS: No acute fracture or dislocation is noted. Small joint effusion is noted with elevation of the anterior fat pad. Mild soft tissue swelling is noted posteriorly. IMPRESSION: Small joint effusion without acute fracture. Mild soft tissue swelling is noted posteriorly. Electronically Signed   By: Inez Catalina M.D.   On: 11/22/2019 17:05   DG Ankle Complete Right  Result Date: 11/22/2019 CLINICAL DATA:  Recent fall with right ankle pain, initial encounter EXAM: RIGHT ANKLE - COMPLETE 3+ VIEW COMPARISON:  None. FINDINGS: Generalized osteopenia is noted. No soft tissue abnormality is seen. No acute fracture or dislocation is noted. Tarsal degenerative changes are seen. IMPRESSION: Chronic changes without acute abnormality. Electronically Signed   By: Inez Catalina M.D.   On: 11/22/2019 17:12   CT Head Wo Contrast  Result Date: 11/22/2019 CLINICAL DATA:  86 year old female status post fall 3 days ago with pain. EXAM: CT HEAD WITHOUT CONTRAST TECHNIQUE: Contiguous axial images were obtained from the base of the skull through the vertex without intravenous  contrast. COMPARISON:  Brain MRI 08/03/2017.  Head CT 10/06/2017. FINDINGS: Brain: Cerebral volume is not significantly changed  since 2019. Patchy and confluent bilateral white matter hypodensity appears increased. No midline shift, ventriculomegaly, mass effect, evidence of mass lesion, intracranial hemorrhage or evidence of cortically based acute infarction. Vascular: Calcified atherosclerosis at the skull base. No suspicious intracranial vascular hyperdensity. Skull: No acute osseous abnormality identified. Sinuses/Orbits: New low-density fluid or mucosal thickening in the right posterior ethmoid, sphenoid sinuses. Other visible sinuses, tympanic cavities and mastoids remain well pneumatized. Other: No acute orbit or scalp soft tissue finding. IMPRESSION: 1. No acute intracranial abnormality or recent traumatic injury identified. 2. Progressed chronic white matter disease since 2019. 3. New mild posterior ethmoid and sphenoid sinus inflammation. Electronically Signed   By: Genevie Ann M.D.   On: 11/22/2019 19:55   CT Lumbar Spine Wo Contrast  Result Date: 11/22/2019 CLINICAL DATA:  Fall.  Severe back pain EXAM: CT LUMBAR SPINE WITHOUT CONTRAST TECHNIQUE: Multidetector CT imaging of the lumbar spine was performed without intravenous contrast administration. Multiplanar CT image reconstructions were also generated. COMPARISON:  Lumbar spine radiographs 02/23/2015 FINDINGS: Segmentation: Normal Alignment: Moderate levoscoliosis.  Normal sagittal alignment. Vertebrae: Severe compression fracture L1 vertebral body. This has progressed since the prior study in 2017 when there was a mild fracture of the superior endplate of L1. Mild bony retropulsion of the superior endplate of L1 into the canal causing mild spinal stenosis. No other fracture Paraspinal and other soft tissues: No significant paraspinous edema at the L1 fracture. Atherosclerotic aorta without aneurysm Cholelithiasis Large fluid collection in the left  pelvis measuring 4.5 x 7 cm. This appears to be adnexal in location. Disc levels: Multilevel disc and facet degeneration throughout the lumbar spine. Mild spinal stenosis at L1 due to posterior retropulsion of superior endplate. No other areas of significant spinal stenosis. IMPRESSION: 1. Severe compression fracture of L1, with progression since 2017. This could be a superimposed acute fracture or chronic fracture. Mild spinal stenosis due to retropulsion into the canal. No other fracture. 2. Cholelithiasis 3. 4.5 x 7 mm fluid collection left pelvis likely from the adnexa. Because this lesion is not adequately characterized, prompt [place] is recommended for further evaluation. Note: This recommendation does not apply to premenarchal patients and to those with increased risk (genetic, family history, elevated tumor markers or other high-risk factors) of ovarian cancer. Reference: JACR 2020 Feb; 17(2):248-254 Electronically Signed   By: Franchot Gallo M.D.   On: 11/22/2019 16:17   CT PELVIS WO CONTRAST  Result Date: 11/22/2019 CLINICAL DATA:  Fall several days ago with pelvic pain, initial encounter EXAM: CT PELVIS WITHOUT CONTRAST TECHNIQUE: Multidetector CT imaging of the pelvis was performed following the standard protocol without intravenous contrast. COMPARISON:  CT of the right femur from earlier in the same day, CT from 09/18/2014. FINDINGS: Urinary Tract:  Bladder is partially distended. Bowel: Scattered feet fecal material is noted throughout the colon. Mild diverticular changes noted. Some very mild inflammatory changes are noted surrounding the distal descending colon which may represent some very mild diverticulitis. Vascular/Lymphatic: Atherosclerotic calcifications are noted without aneurysmal dilatation. No sizable lymphadenopathy is noted. Reproductive: Uterus is within normal limits. Scattered fluid attenuation lesions are noted within the adnexa. The largest of these lies on the left measuring  approximately 6.8 cm. This is similar to that seen on prior CT from 2016 and consistent with a benign etiology. Other:  None. Musculoskeletal: Degenerative changes of lumbar spine are noted. The sacrum appears within normal limits. Old healed fractures of the superior and inferior pubic rami on the right are noted. There  are changes of acute fracture adjacent to the pubic symphysis in both the inferior and superior pubic rami. This corresponds with that seen on prior CT as well as the recent plain film evaluation. Some adjacent stranding is noted consistent with localized edema/hemorrhage related to the injury. No focal confluent hematoma is seen. IMPRESSION: Fractures of the left superior and inferior pubic rami adjacent to the pubic symphysis as described above similar to that seen on prior CT of the femur. Large left adnexal cyst similar to that noted on a prior CT from 2016. Given its long-term stability it is felt to be benign in etiology. Changes suggestive of early diverticulitis in the distal descending colon. Electronically Signed   By: Inez Catalina M.D.   On: 11/22/2019 19:58   CT FEMUR RIGHT WO CONTRAST  Result Date: 11/22/2019 CLINICAL DATA:  Severe right thigh pain after fall on Saturday. EXAM: CT OF THE LOWER RIGHT EXTREMITY WITHOUT CONTRAST TECHNIQUE: Multidetector CT imaging of the right lower extremity was performed according to the standard protocol. COMPARISON:  Right femur x-rays dated August 03, 2017. FINDINGS: Bones/Joint/Cartilage Partially visualized acute parasymphyseal fractures of the left superior and inferior pubic rami. Old healed fractures of the right superior and inferior pubic rami. No dislocation. Serpiginous subchondral sclerosis in the medial tibial plateau likely represents a a bone infarct. Mild tricompartmental degenerative changes of the knee. The right hip joint space is preserved. Osteopenia. No joint effusion. Ligaments Ligaments are suboptimally evaluated by CT.  Muscles and Tendons Grossly intact. Atrophy of the semimembranosus and biceps femoris muscles. Soft tissue Small amount of ill-defined high-density material in the superficial fat of the right hip along the inferior aspect of the greater trochanter, likely hemorrhage. No soft tissue mass. 6.9 x 5.2 cm simple appearing cystic lesion arising from the left ovary. IMPRESSION: 1. Partially visualized acute parasymphyseal fractures of the left superior and inferior pubic rami. Old healed fractures of the right superior and inferior pubic rami. 2. Small ill-defined hematoma in the superficial fat of the right hip along the inferior aspect of the greater trochanter. No underlying fracture. 3. 6.9 cm cystic lesion arising from the left ovary. Because this lesion is not adequately characterized, prompt [place] is recommended for further evaluation. Note: This recommendation does not apply to premenarchal patients and to those with increased risk (genetic, family history, elevated tumor markers or other high-risk factors) of ovarian cancer. Reference: JACR 2020 Feb; 17(2):248-254 Electronically Signed   By: Titus Dubin M.D.   On: 11/22/2019 16:34   DG Chest Port 1 View  Result Date: 11/22/2019 CLINICAL DATA:  Admission for pneumonia, known fractures of the shoulder and pelvis. EXAM: PORTABLE CHEST 1 VIEW COMPARISON:  Chest x-ray 02/11/2018, CT chest 01/31/2017 FINDINGS: Wireless cardiac device overlies the left chest. The heart size and mediastinal contours are unchanged. Redemonstration of a large lucency overlying the mediastinum consistent with known hiatal hernia. Aortic arch calcification. Right base airspace opacity as well as right trace pleural effusion. No definite consolidation within the left lung with persistent passive atelectasis of the lingula. No pulmonary edema. No left pleural effusion. No pneumothorax. No acute displaced fracture identified. Per history, there is a known shoulder fracture does not  visualized on this study and may be collimated off view. IMPRESSION: 1. Right basilar airspace opacity with associated trace pleural effusion suggestive of infection/inflammation. Followup PA and lateral chest X-ray is recommended in 3-4 weeks following trial of antibiotic therapy to ensure resolution and exclude underlying malignancy. 2. Large hiatal  hernia. 3.  Aortic Atherosclerosis (ICD10-I70.0). Electronically Signed   By: Iven Finn M.D.   On: 11/22/2019 20:48   DG Knee Complete 4 Views Right  Result Date: 11/22/2019 CLINICAL DATA:  Recent fall with right knee pain, initial encounter EXAM: RIGHT KNEE - COMPLETE 4+ VIEW COMPARISON:  08/03/2017 FINDINGS: No acute fracture or dislocation is noted. Calcification in the medial tibial plateau is noted without acute abnormality. This is stable from the prior exam. No acute abnormality noted. IMPRESSION: No acute abnormality noted. Electronically Signed   By: Inez Catalina M.D.   On: 11/22/2019 17:09    Review of Systems Blood pressure (!) 144/65, pulse 64, temperature 97.8 F (36.6 C), temperature source Oral, resp. rate 16, height 5' 1"  (1.549 m), SpO2 98 %. Physical Exam Awake and alert and responds to commands. Neck non tender with pain free AROM. T and L spine non tender.  Right shoulder mildly tender over the scapula posteriorly and over the coracoid anteriorly.  Limited AROM due to weakness with some pain. Normal elbow and wrist and hand ROM Left shoulder and arm with good strength and pain free AROM Right hip and leg with pain free AROM and normal strength.  Left hip with painful AROM and also some pain with gentle PROM. Knee is non tender and ankle ROM without pain NVI distally  Assessment/Plan: Left superior and inferior pubic ramus fracture with NO sacrum fracture - WBAT on both legs, patient will self limit her activity. Fractures will heal on their own. For next 6 weeks patient will be at increased risk for falls and will benefit  from PT for mobility assistance possible at skilled care [facility].  Right scapula fracture will no need surgery.  PAtient has evidence of long standing rotator cuff tear arthropathy with superior head migration. Now has new coracoid top fracture and scapula body fracture which are minimally displaced. Recommend non operative management for both with activity and WBAT. No need for formal PT or OT for the shoulder, other than ADL work.   Will follow while admitted.   No ortho follow up needed after discharge unless PT needs new prescription for therapy thanks  Augustin Schooling 11/24/2019, 2:13 PM

## 2019-11-24 NOTE — Evaluation (Signed)
Physical Therapy Evaluation Patient Details Name: Sara Russell MRN: [id number] DOB: [birth year] Today's Date: 11/24/2019   History of Present Illness  86 y.o. female with PMH significant for stroke in 2019 with residual right upper extremity weakness, expressive aphasia; hypertension, hyperlipidemia, persistent A. fib, DVT/PE, diverticulosis, ulcerative colitis.  Pt admitted after fall and sustained Left superior and inferior pubic ramus fracture and new coracoid top fracture and scapula body fracture which are minimally displaced with plan for conservative management.  Clinical Impression  Pt admitted with above diagnosis.  Pt currently with functional limitations due to the deficits listed below (see PT Problem List). Pt will benefit from skilled PT to increase their independence and safety with mobility to allow discharge to the venue listed below.   Pt assisted to [facility] and then recliner.  Pt requiring mod-max assist for mobility at this time due to pain however pt attempting to assist as able.  Pt also with dizziness upon sitting in recliner.  Son present and would prefer pt return to her facility.  Pt requiring physical assist at this time so if ALF unable to assist, pt may need SNF.  BP in recliner: 87/74 mmHg (78) After 2 minutes: 89/55 (67) After about 5 minutes: 99/71 (82) RN notified     Follow Up Recommendations SNF;Supervision/Assistance - 24 hour    Equipment Recommendations  None recommended by PT    Recommendations for Other Services       Precautions / Restrictions Precautions Precautions: Fall Precaution Comments: hx expressive aphasia, no need for R UE sling per Dr. Veverly Fells Restrictions Weight Bearing Restrictions: No Other Position/Activity Restrictions: WBAT R UE and L LE      Mobility  Bed Mobility Overal bed mobility: Needs Assistance Bed Mobility: Supine to Sit     Supine to sit: Max assist;+2 for physical assistance     General bed mobility  comments: verbal cues for self assist, pt able to initiate however required assist  Transfers Overall transfer level: Needs assistance Equipment used: 1 person hand held assist Transfers: Sit to/from [facility] Sit to Stand: Min assist Stand pivot transfers: Mod assist       General transfer comment: pt used UE support reaching for [facility] and recliner armrests, HHA fo R UE, assist to rise and control descent, pt with difficulty advancing L LE due to pain, performed bed to [facility] to recliner  Ambulation/Gait             General Gait Details: deferred due to pain and dizziness  Stairs            Wheelchair Mobility    Modified Rankin (Stroke Patients Only)       Balance Overall balance assessment: History of Falls                                           Pertinent Vitals/Pain Pain Assessment: Faces Faces Pain Scale: Hurts little more Pain Location: L hip, R arm Pain Descriptors / Indicators: Sore Pain Intervention(s): Repositioned;Premedicated before session;Monitored during session    Home Living Family/patient expects to be discharged to:: Assisted living               Home Equipment: Walker - 2 wheels Additional Comments: Pt from [place] [facility], son hopes pt can return to this facility    Prior Function Level of Independence: Independent with assistive device(s)  Comments: ambulatory with RW     Hand Dominance        Extremity/Trunk Assessment   Upper Extremity Assessment Upper Extremity Assessment: RUE deficits/detail RUE Deficits / Details: limited shoulder movement due to hx of CVA    Lower Extremity Assessment Lower Extremity Assessment: Generalized weakness    Cervical / Trunk Assessment Cervical / Trunk Assessment: Kyphotic  Communication   Communication: Expressive difficulties  Cognition Arousal/Alertness: Awake/alert Behavior During Therapy: WFL for tasks assessed/performed                                    General Comments: hx expressive aphasia, pt able to follow commands      General Comments      Exercises     Assessment/Plan    PT Assessment Patient needs continued PT services  PT Problem List Decreased strength;Decreased mobility;Decreased balance;Decreased knowledge of use of DME;Pain;Decreased activity tolerance       PT Treatment Interventions DME instruction;Gait training;Balance training;Therapeutic exercise;Functional mobility training;Therapeutic activities;Patient/family education;Neuromuscular re-education    PT Goals (Current goals can be found in the Care Plan section)  Acute Rehab PT Goals PT Goal Formulation: With patient Time For Goal Achievement: 12/08/19 Potential to Achieve Goals: Good    Frequency Min 3X/week   Barriers to discharge        Co-evaluation               AM-PAC PT "6 Clicks" Mobility  Outcome Measure Help needed turning from your back to your side while in a flat bed without using bedrails?: A Lot Help needed moving from lying on your back to sitting on the side of a flat bed without using bedrails?: A Lot Help needed moving to and from a bed to a chair (including a wheelchair)?: A Lot Help needed standing up from a chair using your arms (e.g., wheelchair or bedside chair)?: A Lot Help needed to walk in hospital room?: A Lot Help needed climbing 3-5 steps with a railing? : Total 6 Click Score: 11    End of Session Equipment Utilized During Treatment: Gait belt Activity Tolerance: Patient tolerated treatment well Patient left: with call bell/phone within reach;in chair;with chair alarm set;with family/visitor present Nurse Communication: Mobility status PT Visit Diagnosis: Other abnormalities of gait and mobility (R26.89);Unsteadiness on feet (R26.81)    Time: 2707-8675 PT Time Calculation (min) (ACUTE ONLY): 31 min   Charges:   PT Evaluation $PT Eval Moderate Complexity: 1  Mod PT Treatments $Therapeutic Activity: 8-22 mins      Jannette Spanner PT, DPT Acute Rehabilitation Services Pager: [phone] Office: [phone]  Trena Platt 11/24/2019, 3:48 PM

## 2019-11-25 DIAGNOSIS — S42114A Nondisplaced fracture of body of scapula, right shoulder, initial encounter for closed fracture: Secondary | ICD-10-CM

## 2019-11-25 DIAGNOSIS — S32010A Wedge compression fracture of first lumbar vertebra, initial encounter for closed fracture: Secondary | ICD-10-CM

## 2019-11-25 DIAGNOSIS — S32592A Other specified fracture of left pubis, initial encounter for closed fracture: Secondary | ICD-10-CM

## 2019-11-25 DIAGNOSIS — S42101A Fracture of unspecified part of scapula, right shoulder, initial encounter for closed fracture: Secondary | ICD-10-CM

## 2019-11-25 LAB — CBC WITH DIFFERENTIAL/PLATELET
Abs Immature Granulocytes: 0.03 10*3/uL (ref 0.00–0.07)
Basophils Absolute: 0 10*3/uL (ref 0.0–0.1)
Basophils Relative: 0 %
Eosinophils Absolute: 0.1 10*3/uL (ref 0.0–0.5)
Eosinophils Relative: 1 %
HCT: 32.8 % — ABNORMAL LOW (ref 36.0–46.0)
Hemoglobin: 10.5 g/dL — ABNORMAL LOW (ref 12.0–15.0)
Immature Granulocytes: 0 %
Lymphocytes Relative: 7 %
Lymphs Abs: 0.7 10*3/uL (ref 0.7–4.0)
MCH: 29.7 pg (ref 26.0–34.0)
MCHC: 32 g/dL (ref 30.0–36.0)
MCV: 92.7 fL (ref 80.0–100.0)
Monocytes Absolute: 0.9 10*3/uL (ref 0.1–1.0)
Monocytes Relative: 8 %
Neutro Abs: 9.1 10*3/uL — ABNORMAL HIGH (ref 1.7–7.7)
Neutrophils Relative %: 84 %
Platelets: 249 10*3/uL (ref 150–400)
RBC: 3.54 MIL/uL — ABNORMAL LOW (ref 3.87–5.11)
RDW: 13.8 % (ref 11.5–15.5)
WBC: 10.8 10*3/uL — ABNORMAL HIGH (ref 4.0–10.5)
nRBC: 0 % (ref 0.0–0.2)

## 2019-11-25 LAB — PHOSPHORUS: Phosphorus: 2.4 mg/dL — ABNORMAL LOW (ref 2.5–4.6)

## 2019-11-25 LAB — MAGNESIUM: Magnesium: 2.1 mg/dL (ref 1.7–2.4)

## 2019-11-25 NOTE — Plan of Care (Signed)
  Problem: Activity: Goal: Risk for activity intolerance will decrease Outcome: Progressing   Problem: Nutrition: Goal: Adequate nutrition will be maintained Outcome: Progressing   Problem: Pain Managment: Goal: General experience of comfort will improve Outcome: Progressing   Problem: Skin Integrity: Goal: Risk for impaired skin integrity will decrease Outcome: Progressing

## 2019-11-25 NOTE — Progress Notes (Signed)
PROGRESS NOTE    Sara Russell  WUJ:[id number] DOB: [birth year] DOA: 11/22/2019 PCP: [name], DO     Brief Narrative:  Sara Russell a 86 y.o.WF PMHx stroke in 2019 with residual right upper extremity weakness, expressive aphasia;HTN, HLD, persistent A. fib, DVT/PE, Diverticulosis, ulcerative colitis.   Patient is alert, awake and able to listen to conversation but has significant expressive aphasia. Her son at bedside helping with the history. Since her stroke in 2019, patient has been living in an assisted living facility. At baseline, she has expressive aphasia and weakness of right upper extremity but has no dysphagia and is able to ambulate with a walker. Inrespect to patient's wish, she is DNR/DNI and does not want any aggressive measures. 10/9, in the morning while trying to open the refrigerator, patient lost balance and fell on the floor. Staff attended immediately and did not notice any major injury. Her son visited in the afternoon and at that time, she complained of pain in the right shoulder and left hip. X-rays were done at the facility and were negative. Her pain got worse in the next 48 hours and hence sent to the ED.  She was seen at ED in [facility]. Multiple imagings were obtained.Significant findings being right scapula fracture,left pubic bone fracture and chronic compression fracture of L1 vertebrae.  ED physician discussed the case with orthopedics on-call Dr. Rolena Infante who recommended nonsurgical conservative management. Patient was accepted for admission last night but because of bed unavailability, patient arrived this afternoon only.  At the time of my evaluation, patient was alert, awake, comfortable. Son was at bedside and help all with the history.   Subjective: Afebrile overnight patient has difficulty understanding speech for mother personal except from her  daughter-in-law who relays questions.  Patient can then answer  appropriately, though she has to search for words at times.   Assessment & Plan: Covid vaccination; vaccinated   Active Problems:   Fall   Acute Metabolic Encephalopathy On my evaluation this morning she was lethargic, aroused to voice minimally, but also painful stimuli.  VBG with out hypercarbia.  Since my interaction with her, she's woken up per discussion with nursing.  Of note, she has expressive aphasia. Continue to follow. Delirium precautions Caution with potentially sedating meds  Multiple fractures secondary to fall -Multiple imagings were obtained.Significant findings being right scapular fracture,left superior and inferior pubic rami fxs and severe compression fracture of L1 vertebrae with progression since 2017. -appreciate ortho recommendations - WBAT for superior and inferior L pubic ramus fx.  She'll be at increased risk of falls would benefit from therapy.  For R scapular fx, recommending WBAT, no need for formal PT or OT for the shoulder other than ADL work. -We will order for sling for right arm fracture. -scheduled APAP, oxycodone prn, morphine for breakthrough  -PT eval ordered.  Community Acquired Pneumonia Early Acute Diverticulitis E Coli UTI Pt with R basilar airspace opacity with associated trace effusion suggestive of infection/inflammation Also has changes suggestive of early diverticulitis in the distal descending colon E. Coli UTI sensitive to ceftriaxone/ancef Will cover above possible infections with ceftriaxone/flagyl.  Plan for 5 day course. SLP evaluation Needs repeat CXR outpatient in 3-4 weeks to ensure resolution  Cardiovascular issues:  Stroke in 2019 with residual right upper extremity weakness, expressive aphasia hypertension, hyperlipidemia,  persistent A. Fib -Resume home meds which include Coreg 6.83m twice daily, Amiodarone 100 mg daily, aspirin 81 mg daily andLipitor 10 mg  daily -Continue  monitorintelemetry.  Hypothyroidism -Synthroid 75 mcg daily  Abnormal CT abdomen finding - Large L adnexal cyst, thought to be benign given long term stability, follow outpatient   Other home meds include Protonix 40 mg daily, Sodium chloride tablet 1 g twice daily   DVT prophylaxis: Heparin Code Status: DNR Family Communication: 10/14 daughter-in-law at bedside discussed plan of care answered all questions Status is: Inpatient    Dispo: The patient is from: Home              Anticipated d/c is to: SNF              Anticipated d/c date is:??              Patient currently unstable      Consultants:    Procedures/Significant Events:    I have personally reviewed and interpreted all radiology studies and my findings are as above.  VENTILATOR SETTINGS: Room air 10/14 SPO2 98%   Cultures   Antimicrobials:    Devices    LINES / TUBES:      Continuous Infusions:  cefTRIAXone (ROCEPHIN)  IV 2 g (11/24/19 2134)   metronidazole 500 mg (11/25/19 0535)     Objective: Vitals:   11/24/19 1212 11/24/19 1416 11/24/19 2050 11/25/19 0647  BP: (!) 144/65 (!) 94/53 130/62 (!) 166/85  Pulse: 64 (!) 56 63 73  Resp: 16   20  Temp: 97.8 F (36.6 C)  98.5 F (36.9 C) (!) 97.1 F (36.2 C)  TempSrc: Oral  Oral   SpO2: 98% 97% 96%   Height:        Intake/Output Summary (Last 24 hours) at 11/25/2019 0848 Last data filed at 11/24/2019 1400 Gross per 24 hour  Intake 120 ml  Output 100 ml  Net 20 ml   There were no vitals filed for this visit.  Examination:  General: No acute respiratory distress Eyes: negative scleral hemorrhage, negative anisocoria, negative icterus ENT: Negative Runny nose, negative gingival bleeding, Neck:  Negative scars, masses, torticollis, lymphadenopathy, JVD Lungs: Clear to auscultation bilaterally without wheezes or crackles Cardiovascular: Regular rate and rhythm without murmur gallop or rub normal S1 and S2 Abdomen:  negative abdominal pain, nondistended, positive soft, bowel sounds, no rebound, no ascites, no appreciable mass Extremities: pain to abduction of right shoulder with crepitus, pain to palpation ribs 3-5 lateral left side Skin: Negative rashes, lesions, ulcers Psychiatric:  Negative depression, negative anxiety, negative fatigue, negative mania  Central nervous system:  Cranial nerves II through XII intact, tongue/uvula midline, all extremities muscle strength 5/5, sensation intact throughout, positive dysarthria, negative expressive aphasia, positive receptive aphasia (from people outside her family).  .     Data Reviewed: Care during the described time interval was provided by me .  I have reviewed this patient's available data, including medical history, events of note, physical examination, and all test results as part of my evaluation.  CBC: Recent Labs  Lab 11/22/19 1844 11/24/19 0559  WBC 14.5* 8.7  NEUTROABS 12.6*  --   HGB 10.8* 9.3*  HCT 33.8* 29.4*  MCV 91.8 92.5  PLT 223 400   Basic Metabolic Panel: Recent Labs  Lab 11/22/19 1844 11/24/19 0559  NA 136 134*  K 4.9 4.0  CL 103 104  CO2 24 20*  GLUCOSE 117* 95  BUN 33* 40*  CREATININE 1.69* 1.65*  CALCIUM 8.5* 7.8*   GFR: CrCl cannot be calculated (Unknown ideal weight.). Liver Function Tests: Recent Labs  Lab 11/22/19 1844  AST 21  ALT 15  ALKPHOS 117  BILITOT 0.3  PROT 6.9  ALBUMIN 3.1*   No results for input(s): LIPASE, AMYLASE in the last 168 hours. No results for input(s): AMMONIA in the last 168 hours. Coagulation Profile: No results for input(s): INR, PROTIME in the last 168 hours. Cardiac Enzymes: Recent Labs  Lab 11/22/19 1844  CKTOTAL 32*   BNP (last 3 results) No results for input(s): PROBNP in the last 8760 hours. HbA1C: No results for input(s): HGBA1C in the last 72 hours. CBG: No results for input(s): GLUCAP in the last 168 hours. Lipid Profile: No results for input(s): CHOL,  HDL, LDLCALC, TRIG, CHOLHDL, LDLDIRECT in the last 72 hours. Thyroid Function Tests: No results for input(s): TSH, T4TOTAL, FREET4, T3FREE, THYROIDAB in the last 72 hours. Anemia Panel: No results for input(s): VITAMINB12, FOLATE, FERRITIN, TIBC, IRON, RETICCTPCT in the last 72 hours. Sepsis Labs: No results for input(s): PROCALCITON, LATICACIDVEN in the last 168 hours.  Recent Results (from the past 240 hour(s))  Respiratory Panel by RT PCR (Flu A&B, Covid) - Nasopharyngeal Swab     Status: None   Collection Time: 11/22/19  6:45 PM   Specimen: Nasopharyngeal Swab  Result Value Ref Range Status   SARS Coronavirus 2 by RT PCR NEGATIVE NEGATIVE Final    Comment: (NOTE) SARS-CoV-2 target nucleic acids are NOT DETECTED.  The SARS-CoV-2 RNA is generally detectable in upper respiratoy specimens during the acute phase of infection. The lowest concentration of SARS-CoV-2 viral copies this assay can detect is 131 copies/mL. A negative result does not preclude SARS-Cov-2 infection and should not be used as the sole basis for treatment or other patient management decisions. A negative result may occur with  improper specimen collection/handling, submission of specimen other than nasopharyngeal swab, presence of viral mutation(s) within the areas targeted by this assay, and inadequate number of viral copies (<131 copies/mL). A negative result must be combined with clinical observations, patient history, and epidemiological information. The expected result is Negative.  Fact Sheet for Patients:  PinkCheek.be  Fact Sheet for Healthcare Providers:  GravelBags.it  This test is no t yet approved or cleared by the [place] FDA and  has been authorized for detection and/or diagnosis of SARS-CoV-2 by FDA under an Emergency Use Authorization (EUA). This EUA will remain  in effect (meaning this test can be used) for the duration of  the COVID-19 declaration under Section 564(b)(1) of the Act, 21 U.S.C. section 360bbb-3(b)(1), unless the authorization is terminated or revoked sooner.     Influenza A by PCR NEGATIVE NEGATIVE Final   Influenza B by PCR NEGATIVE NEGATIVE Final    Comment: (NOTE) The Xpert Xpress SARS-CoV-2/FLU/RSV assay is intended as an aid in  the diagnosis of influenza from Nasopharyngeal swab specimens and  should not be used as a sole basis for treatment. Nasal washings and  aspirates are unacceptable for Xpert Xpress SARS-CoV-2/FLU/RSV  testing.  Fact Sheet for Patients: PinkCheek.be  Fact Sheet for Healthcare Providers: GravelBags.it  This test is not yet approved or cleared by the [place] FDA and  has been authorized for detection and/or diagnosis of SARS-CoV-2 by  FDA under an Emergency Use Authorization (EUA). This EUA will remain  in effect (meaning this test can be used) for the duration of the  Covid-19 declaration under Section 564(b)(1) of the Act, 21  U.S.C. section 360bbb-3(b)(1), unless the authorization is  terminated or revoked. Performed at [facility],  [place], [place] [address]   Culture, blood (routine x 2)     Status: None (Preliminary result)   Collection Time: 11/22/19  8:53 PM   Specimen: BLOOD  Result Value Ref Range Status   Specimen Description   Final    BLOOD BLOOD LEFT FOREARM Performed at [facility], [address] [place], [place] [address]    Special Requests   Final    BOTTLES DRAWN AEROBIC AND ANAEROBIC Blood Culture adequate volume Performed at [facility], [address] [place], [place] [address]    Culture   Final    NO GROWTH 2 DAYS Performed at [facility], [place] [address], [place], [place] [address]    Report Status PENDING  Incomplete  Culture, blood (routine x 2)     Status: None (Preliminary result)   Collection  Time: 11/22/19  8:56 PM   Specimen: BLOOD  Result Value Ref Range Status   Specimen Description   Final    BLOOD BLOOD RIGHT FOREARM Performed at [facility], [address] [place], [place] [address]    Special Requests   Final    BOTTLES DRAWN AEROBIC AND ANAEROBIC Blood Culture adequate volume Performed at [facility], [address] [place], [place] [address]    Culture   Final    NO GROWTH 2 DAYS Performed at [facility], [place] [address] [place], [place] [address]    Report Status PENDING  Incomplete  Culture, Urine     Status: Abnormal   Collection Time: 11/22/19  9:10 PM   Specimen: Urine, Random  Result Value Ref Range Status   Specimen Description   Final    URINE, RANDOM Performed at [facility], [address] [place], [place] [address]    Special Requests   Final    NONE Performed at [facility], [address] [place], [place] [address]    Culture >=100,000 COLONIES/mL ESCHERICHIA COLI (A)  Final   Report Status 11/24/2019 FINAL  Final   Organism ID, Bacteria ESCHERICHIA COLI (A)  Final      Susceptibility   Escherichia coli - MIC*    AMPICILLIN >=32 RESISTANT Resistant     CEFAZOLIN <=4 SENSITIVE Sensitive     CEFTRIAXONE <=0.25 SENSITIVE Sensitive     CIPROFLOXACIN 0.5 SENSITIVE Sensitive     GENTAMICIN <=1 SENSITIVE Sensitive     IMIPENEM <=0.25 SENSITIVE Sensitive     NITROFURANTOIN <=16 SENSITIVE Sensitive     TRIMETH/SULFA >=320 RESISTANT Resistant     AMPICILLIN/SULBACTAM 16 INTERMEDIATE Intermediate     PIP/TAZO <=4 SENSITIVE Sensitive     * >=100,000 COLONIES/mL ESCHERICHIA COLI         Radiology Studies: No results found.      Scheduled Meds:  acetaminophen  1,000 mg Oral Q8H   amiodarone  100 mg Oral Daily   aspirin EC  81 mg Oral Daily   atorvastatin  10 mg Oral Daily   carvedilol  6.25 mg Oral BID WC   docusate sodium  100 mg Oral QODAY   influenza vaccine  adjuvanted  0.5 mL Intramuscular Tomorrow-1000   levothyroxine  75 mcg Oral QAC breakfast   pantoprazole  40 mg Oral Daily   senna  1 tablet Oral BID   tiZANidine  2 mg Oral TID WC & HS   Continuous Infusions:  cefTRIAXone (  ROCEPHIN)  IV 2 g (11/24/19 2134)   metronidazole 500 mg (11/25/19 0535)     LOS: 1 day    Time spent:40 min    Grey Schlauch, Geraldo Docker, MD [facility] Pager [phone]  If 7PM-7AM, please contact night-coverage [email or web address] Password [facility] 11/25/2019, 8:48 AM

## 2019-11-25 NOTE — Progress Notes (Signed)
Physical Therapy Treatment Patient Details Name: Sara Russell MRN: [id number] DOB: [birth year] Today's Date: 11/25/2019    History of Present Illness 86 y.o. female with PMH significant for stroke in 2019 with residual right upper extremity weakness, expressive aphasia; hypertension, hyperlipidemia, persistent A. fib, DVT/PE, diverticulosis, ulcerative colitis.  Pt admitted after fall and sustained Left superior and inferior pubic ramus fracture and new coracoid top fracture and scapula body fracture which are minimally displaced with plan for conservative management.    PT Comments    Assisted pt OOB to attempt amb was very difficult. General bed mobility comments: increased time and use of bed pad to complete scooting to EOB.  Deceased functional use R UE. General transfer comment: pt unable to use any AD so required + 2 side by side assist to rise from bed.  Very fearful.  Increased pain L buttock upon standing.  Partial upright posture.  Poor balance. General Gait Details: attempted gait with + 2 side by side assist was very difficult for her to tolerate much WBing L LE and great difficulty weight shifting to advance either LE.  Pt did amb from bed to sink approx 2/5 feet, turn which was even more diffiult then amb back to bed.  Therapist and NT basically holding her weight.  L LE remains externally rotated and R LE is weak from her previous stroke. Assisted back to bed and positioned R sidelying.  Daughter in law present at time. Family really wants pt to return to [facility].  If [facility] has a SNF level then rec D/C there for ST Rehab prior to returning to ALF.  Follow Up Recommendations  SNF     Equipment Recommendations  None recommended by PT    Recommendations for Other Services       Precautions / Restrictions Precautions Precautions: Fall Precaution Comments: hx expressive aphasia and CVA R Hemi, no need for R UE sling per Dr. Veverly Fells Restrictions Other  Position/Activity Restrictions: WBAT R UE and L LE    Mobility  Bed Mobility Overal bed mobility: Needs Assistance Bed Mobility: Supine to Sit;Sit to Supine     Supine to sit: Max assist;+2 for safety/equipment Sit to supine: +2 for safety/equipment;Total assist   General bed mobility comments: increased time and use of bed pad to complete scooting to EOB.  Deceased functional use R UE.  Transfers Overall transfer level: Needs assistance Equipment used: None;2 person hand held assist Transfers: Sit to/from [facility] Sit to Stand: Mod assist Stand pivot transfers: Max assist       General transfer comment: pt unable to use any AD so required + 2 side by side assist to rise from bed.  Very fearful.  Increased pain L buttock upon standing.  Partial upright posture.  Poor balance.  Ambulation/Gait Ambulation/Gait assistance: +2 physical assistance;Max assist Gait Distance (Feet): 5 Feet Assistive device: None Gait Pattern/deviations: Step-to pattern;Decreased stance time - left Gait velocity: decreased   General Gait Details: attempted gait with + 2 side by side assist was very difficult for her to tolerate much WBing L LE and great difficulty weight shifting to advance either LE.  Pt did amb from bed to sink approx 2/5 feet, turn which was even more diffiult then amb back to bed.  Therapist and NT basically holding her weight.  L LE remains externally rotated and R LE is weak from her previous stroke.   Stairs  Wheelchair Mobility    Modified Rankin (Stroke Patients Only)       Balance                                            Cognition Arousal/Alertness: Awake/alert Behavior During Therapy: WFL for tasks assessed/performed                                   General Comments: hx expressive aphasia, pt able to follow commands      Exercises      General Comments        Pertinent Vitals/Pain  Pain Assessment: Faces Faces Pain Scale: Hurts little more Pain Location: pelvis, shoulder with activity Pain Descriptors / Indicators: Grimacing Pain Intervention(s): Monitored during session;Repositioned    Home Living                      Prior Function            PT Goals (current goals can now be found in the care plan section) Progress towards PT goals: Progressing toward goals    Frequency    Min 3X/week      PT Plan Current plan remains appropriate    Co-evaluation              AM-PAC PT "6 Clicks" Mobility   Outcome Measure  Help needed turning from your back to your side while in a flat bed without using bedrails?: A Lot Help needed moving from lying on your back to sitting on the side of a flat bed without using bedrails?: A Lot Help needed moving to and from a bed to a chair (including a wheelchair)?: A Lot Help needed standing up from a chair using your arms (e.g., wheelchair or bedside chair)?: A Lot Help needed to walk in hospital room?: Total Help needed climbing 3-5 steps with a railing? : Total 6 Click Score: 10    End of Session Equipment Utilized During Treatment: Gait belt Activity Tolerance: Patient limited by fatigue;Patient limited by pain Patient left: in bed;with call bell/phone within reach;with family/visitor present Nurse Communication: Mobility status PT Visit Diagnosis: Other abnormalities of gait and mobility (R26.89);Unsteadiness on feet (R26.81)     Time: 8887-5797 PT Time Calculation (min) (ACUTE ONLY): 24 min  Charges:  $Gait Training: 8-22 mins $Therapeutic Activity: 8-22 mins                     {Amamda Curbow  PTA Acute  Rehabilitation Services Pager      [phone] Office      [phone]

## 2019-11-25 NOTE — Evaluation (Signed)
Clinical/Bedside Swallow Evaluation Patient Details  Name: Sara Russell MRN: [id number] Date of Birth: 11/03/1930  Today's Date: 11/25/2019 Time: SLP Start Time (ACUTE ONLY): 1115 SLP Stop Time (ACUTE ONLY): 1131 SLP Time Calculation (min) (ACUTE ONLY): 16 min  Past Medical History:  Past Medical History:  Diagnosis Date  . Abdominal mass   . AKI (acute kidney injury) ([place]) 09/15/2014  . Atrial fibrillation ([place])   . Colitis, ulcerative ([place])   . Cough    WHITE SPUTUM FOR 2 WEEKS, NO FEVER  . Degenerative joint disease   . Diverticulosis   . DVT (deep venous thrombosis) ([place]) 2009   BOTH LUNGS  . GERD (gastroesophageal reflux disease)   . Hyperlipidemia   . Hyperplastic colon polyp   . Hypertension   . Hypothyroidism   . Iron deficiency anemia    ON IRON AT TIMES  . PE (pulmonary embolism) 2009  . Persistent atrial fibrillation ([place])   . Positive H. pylori test 01/20/96  . S/P dilatation of esophageal stricture 1992  . Skin cancer    h/o basal and squamous skin cancer--> removed  . Stroke [facility])    Past Surgical History:  Past Surgical History:  Procedure Laterality Date  . APPENDECTOMY    . BREAST EXCISIONAL BIOPSY Left 1992   benign  . BREAST LUMPECTOMY     benign  . DILATION AND CURETTAGE OF UTERUS     for endometrial polyps  . ENDOSCOPIC RETROGRADE CHOLANGIOPANCREATOGRAPHY (ERCP) WITH PROPOFOL N/A 03/23/2015   Procedure: ENDOSCOPIC RETROGRADE CHOLANGIOPANCREATOGRAPHY (ERCP) WITH PROPOFOL;  Surgeon: Milus Banister, MD;  Location: [facility];  Service: Endoscopy;  Laterality: N/A;  . JOINT REPLACEMENT    . LOOP RECORDER INSERTION N/A 10/14/2017   Procedure: LOOP RECORDER INSERTION;  Surgeon: Thompson Grayer, MD;  Location: [facility];  Service: Cardiovascular;  Laterality: N/A;  . ORIF ANKLE FRACTURE Left    X 2  . REPLACEMENT TOTAL KNEE     left  . SURGERY DONE TO REMOVE LEFT ANKLE HARDWARE LATER    . TONSILLECTOMY     HPI:  86 yr old admitted with  pubic ramus fracture, scapula fx. PMH: CVA 2019 (short term dysphagia), esophageal stricture 1992, GERD, large hiatal hernia, HTN, ulcerative colitis, diverticulitis, A-fib. CXR right basilar airspace opacity w/ associated trace pleural effusion suggestive of inflammation/infection.     Assessment / Plan / Recommendation Clinical Impression  Pt needed cueing for redirection/attention with daughter-in-law at bedside (mild confusion). Pt having pain with repositioning and with cues and distraction able to raise head of bed to decent level. Pt denies recent dysphagia since stroke 2019. Oral-motor examination unremarkable with adequate dentition. Multiple straw sips consumed without indicators of aspiration upon subjective assessment. Has little to no appetite and needed encouragement for graham cracker ( late breakfast tray and pt declined). She can continue regular texture, thin liquids, pills with thin, cannot eat in suboptimal reclined position. No further ST needed.   SLP Visit Diagnosis: Dysphagia, unspecified (R13.10)    Aspiration Risk  Mild aspiration risk    Diet Recommendation Regular;Thin liquid   Liquid Administration via: Cup;Straw Medication Administration: Whole meds with liquid Supervision: Patient able to self feed Compensations: Minimize environmental distractions;Slow rate;Small sips/bites Postural Changes: Seated upright at 90 degrees;Remain upright for at least 30 minutes after po intake    Other  Recommendations Oral Care Recommendations: Oral care BID   Follow up Recommendations None      Frequency and Duration  Prognosis        Swallow Study   General Date of Onset: 11/22/19 HPI: 86 yr old admitted with pubic ramus fracture, scapula fx. PMH: CVA 2019 (short term dysphagia), esophageal stricture 1992, GERD, large hiatal hernia, HTN, ulcerative colitis, diverticulitis, A-fib. CXR right basilar airspace opacity w/ associated trace pleural effusion  suggestive of inflammation/infection.   Type of Study: Bedside Swallow Evaluation Previous Swallow Assessment:  (see HPI) Diet Prior to this Study: Regular;Thin liquids Temperature Spikes Noted: No Respiratory Status: Room air History of Recent Intubation: No Behavior/Cognition: Alert;Cooperative;Pleasant mood;Confused;Requires cueing Oral Cavity Assessment: Within Functional Limits Oral Care Completed by SLP: No Oral Cavity - Dentition: Adequate natural dentition Vision: Functional for self-feeding Self-Feeding Abilities: Able to feed self Patient Positioning:  (slightly reclined) Baseline Vocal Quality: Normal Volitional Cough: Weak Volitional Swallow: Able to elicit    Oral/Motor/Sensory Function Overall Oral Motor/Sensory Function: Within functional limits   Ice Chips Ice chips: Not tested   Thin Liquid Thin Liquid: Within functional limits Presentation: Cup;Straw    Nectar Thick Nectar Thick Liquid: Not tested   Honey Thick Honey Thick Liquid: Not tested   Puree Puree: Not tested   Solid     Solid: Within functional limits      Houston Siren 11/25/2019,11:57 AM  Orbie Pyo Colvin Caroli.Ed Risk analyst [phone] Office [phone]

## 2019-11-25 NOTE — Progress Notes (Signed)
° °  Subjective:    Recheck right shoulder s/p scapula fracture and pelvis s/p rami fractures Pt feeling better with less pain today Denies any new symptoms or issues Family in room   Patient reports pain as mild.  Objective:   VITALS:   Vitals:   11/24/19 2050 11/25/19 0647  BP: 130/62 (!) 166/85  Pulse:  73  Resp:  20  Temp:  (!) 97.1 F (36.2 C)  SpO2: 96%     Right shoulder: mild pain with rom nv intact distally No rashes or signs of open injury  Mild pain with log roll of bilateral legs nv intact distally No rashes or edema distally Painful gait  LABS Recent Labs    11/22/19 1844 11/24/19 0559 11/25/19 0943  HGB 10.8* 9.3* 10.5*  HCT 33.8* 29.4* 32.8*  WBC 14.5* 8.7 10.8*  PLT 223 221 249    Recent Labs    11/22/19 1844 11/24/19 0559  NA 136 134*  K 4.9 4.0  BUN 33* 40*  CREATININE 1.69* 1.65*  GLUCOSE 117* 95     Assessment/Plan:   Left superior and inferior pubic rami fractures and right scapula fracture all stable May weight bear as tolerated with help Pain management as needed No need for surgical intervention at this time Will continue to monitor her progress Discussed with pt and family today    Merla Riches PA-C, [facility] [employer]   [address], [place], [place] [address] Phone: [phone] [email or web address] Facebook   [employer]

## 2019-11-25 NOTE — Progress Notes (Signed)
ANTICOAGULATION CONSULT NOTE - Initial Consult  Pharmacy Consult for Heparin Indication: VTE prophylaxis  Allergies  Allergen Reactions  . Iohexol Hives  . Ivp Dye [Iodinated Diagnostic Agents] Hives    Was able to tolerate Contrast Media for CT scan.  [name] [Shellfish Allergy] Nausea And Vomiting    Projectile vomiting  . Shellfish-Derived Products Nausea And Vomiting  . Sulfa Antibiotics Hives    Patient Measurements: Height: 5' 1"  (154.9 cm) IBW/kg (Calculated) : 47.8  No weight documented   Vital Signs: Temp: 98 F (36.7 C) (10/14 2015) Temp Source: Oral (10/14 2015) BP: 108/53 (10/14 2015) Pulse Rate: 62 (10/14 2015)  Labs: Recent Labs    11/24/19 0559 11/25/19 0943  HGB 9.3* 10.5*  HCT 29.4* 32.8*  PLT 221 249  CREATININE 1.65*  --     CrCl cannot be calculated (Unknown ideal weight.).   Medical History: Past Medical History:  Diagnosis Date  . Abdominal mass   . AKI (acute kidney injury) ([place]) 09/15/2014  . Atrial fibrillation ([place])   . Colitis, ulcerative ([place])   . Cough    WHITE SPUTUM FOR 2 WEEKS, NO FEVER  . Degenerative joint disease   . Diverticulosis   . DVT (deep venous thrombosis) ([place]) 2009   BOTH LUNGS  . GERD (gastroesophageal reflux disease)   . Hyperlipidemia   . Hyperplastic colon polyp   . Hypertension   . Hypothyroidism   . Iron deficiency anemia    ON IRON AT TIMES  . PE (pulmonary embolism) 2009  . Persistent atrial fibrillation ([place])   . Positive H. pylori test 01/20/96  . S/P dilatation of esophageal stricture 1992  . Skin cancer    h/o basal and squamous skin cancer--> removed  . Stroke [facility])     Medications:  Scheduled:  . acetaminophen  1,000 mg Oral Q8H  . amiodarone  100 mg Oral Daily  . aspirin EC  81 mg Oral Daily  . atorvastatin  10 mg Oral Daily  . carvedilol  6.25 mg Oral BID WC  . docusate sodium  100 mg Oral QODAY  . influenza vaccine adjuvanted  0.5 mL Intramuscular Tomorrow-1000  .  levothyroxine  75 mcg Oral QAC breakfast  . pantoprazole  40 mg Oral Daily  . senna  1 tablet Oral BID  . tiZANidine  2 mg Oral TID WC & HS   Infusions:  . cefTRIAXone (ROCEPHIN)  IV 2 g (11/24/19 2134)  . metronidazole 500 mg (11/25/19 1238)    Assessment: 72 yoF admitted on 10/11 with multiple fractures secondary to fall.  PMH is significant for Afib, Stroke, DVT/PE not on any oral anticoagulation except Aspirin 81 mg. PHARMACY CONSULT: Heparin for VTE prophylaxis  SCr 1.65 CBC:  Hgb 10.5, Plt 249   Plan:  Heparin 5000 units SQ q8h Pharmacy to sign off consult  Gretta Arab PharmD, BCPS Clinical Pharmacist [facility] main pharmacy [phone] 11/25/2019 9:16 PM

## 2019-11-26 DIAGNOSIS — K5792 Diverticulitis of intestine, part unspecified, without perforation or abscess without bleeding: Secondary | ICD-10-CM | POA: Diagnosis present

## 2019-11-26 DIAGNOSIS — E785 Hyperlipidemia, unspecified: Secondary | ICD-10-CM | POA: Diagnosis present

## 2019-11-26 DIAGNOSIS — I82409 Acute embolism and thrombosis of unspecified deep veins of unspecified lower extremity: Secondary | ICD-10-CM | POA: Diagnosis present

## 2019-11-26 DIAGNOSIS — B9620 Unspecified Escherichia coli [E. coli] as the cause of diseases classified elsewhere: Secondary | ICD-10-CM

## 2019-11-26 DIAGNOSIS — I2699 Other pulmonary embolism without acute cor pulmonale: Secondary | ICD-10-CM | POA: Diagnosis present

## 2019-11-26 DIAGNOSIS — G9341 Metabolic encephalopathy: Secondary | ICD-10-CM | POA: Diagnosis present

## 2019-11-26 DIAGNOSIS — J189 Pneumonia, unspecified organism: Secondary | ICD-10-CM

## 2019-11-26 DIAGNOSIS — I4819 Other persistent atrial fibrillation: Secondary | ICD-10-CM

## 2019-11-26 DIAGNOSIS — N390 Urinary tract infection, site not specified: Secondary | ICD-10-CM | POA: Diagnosis present

## 2019-11-26 LAB — COMPREHENSIVE METABOLIC PANEL
ALT: 12 U/L (ref 0–44)
AST: 20 U/L (ref 15–41)
Albumin: 2.6 g/dL — ABNORMAL LOW (ref 3.5–5.0)
Alkaline Phosphatase: 97 U/L (ref 38–126)
Anion gap: 9 (ref 5–15)
BUN: 54 mg/dL — ABNORMAL HIGH (ref 8–23)
CO2: 21 mmol/L — ABNORMAL LOW (ref 22–32)
Calcium: 7.9 mg/dL — ABNORMAL LOW (ref 8.9–10.3)
Chloride: 106 mmol/L (ref 98–111)
Creatinine, Ser: 1.71 mg/dL — ABNORMAL HIGH (ref 0.44–1.00)
GFR, Estimated: 26 mL/min — ABNORMAL LOW (ref >60)
Glucose, Bld: 100 mg/dL — ABNORMAL HIGH (ref 70–99)
Potassium: 3.8 mmol/L (ref 3.5–5.1)
Sodium: 136 mmol/L (ref 135–145)
Total Bilirubin: 0.4 mg/dL (ref 0.3–1.2)
Total Protein: 5.6 g/dL — ABNORMAL LOW (ref 6.5–8.1)

## 2019-11-26 LAB — CBC WITH DIFFERENTIAL/PLATELET
Abs Immature Granulocytes: 0.03 10*3/uL (ref 0.00–0.07)
Basophils Absolute: 0 10*3/uL (ref 0.0–0.1)
Basophils Relative: 1 %
Eosinophils Absolute: 0.1 10*3/uL (ref 0.0–0.5)
Eosinophils Relative: 2 %
HCT: 26.8 % — ABNORMAL LOW (ref 36.0–46.0)
Hemoglobin: 8.7 g/dL — ABNORMAL LOW (ref 12.0–15.0)
Immature Granulocytes: 1 %
Lymphocytes Relative: 10 %
Lymphs Abs: 0.6 10*3/uL — ABNORMAL LOW (ref 0.7–4.0)
MCH: 29.7 pg (ref 26.0–34.0)
MCHC: 32.5 g/dL (ref 30.0–36.0)
MCV: 91.5 fL (ref 80.0–100.0)
Monocytes Absolute: 0.7 10*3/uL (ref 0.1–1.0)
Monocytes Relative: 10 %
Neutro Abs: 5.1 10*3/uL (ref 1.7–7.7)
Neutrophils Relative %: 76 %
Platelets: 223 10*3/uL (ref 150–400)
RBC: 2.93 MIL/uL — ABNORMAL LOW (ref 3.87–5.11)
RDW: 13.7 % (ref 11.5–15.5)
WBC: 6.6 10*3/uL (ref 4.0–10.5)
nRBC: 0 % (ref 0.0–0.2)

## 2019-11-26 LAB — PHOSPHORUS: Phosphorus: 2.6 mg/dL (ref 2.5–4.6)

## 2019-11-26 LAB — MAGNESIUM: Magnesium: 2 mg/dL (ref 1.7–2.4)

## 2019-11-26 MED ORDER — SODIUM CHLORIDE 0.9 % IV SOLN: INTRAVENOUS | Status: DC

## 2019-11-26 NOTE — Plan of Care (Signed)
  Problem: Activity: Goal: Risk for activity intolerance will decrease Outcome: Progressing   Problem: Nutrition: Goal: Adequate nutrition will be maintained Outcome: Progressing   Problem: Pain Managment: Goal: General experience of comfort will improve Outcome: Progressing   Problem: Skin Integrity: Goal: Risk for impaired skin integrity will decrease Outcome: Progressing

## 2019-11-26 NOTE — NC FL2 (Signed)
[place] LEVEL OF CARE SCREENING TOOL     IDENTIFICATION  Patient Name: Sara Russell Birthdate: July 12, 1930 Sex: female Admission Date (Current Location): 11/22/2019  [facility] and [place] Number:  Herbalist and Address:  [facility],  [address], [place]      Provider Number: [id number]  Attending Physician Name and Address:  Allie Bossier, MD  Relative Name and Phone Number:  [place], Screws [phone]  [phone]    Current Level of Care: Hospital Recommended Level of Care: [place] Prior Approval Number:    Date Approved/Denied:   PASRR Number: [id number]  Discharge Plan: SNF    Current Diagnoses: Patient Active Problem List   Diagnosis Date Noted  . DVT (deep venous thrombosis) ([place]) 11/26/2019  . Pulmonary embolus ([place]) 11/26/2019  . Persistent atrial fibrillation ([place]) 11/26/2019  . Acute metabolic encephalopathy 29/52/8413  . Community acquired pneumonia 11/26/2019  . E. coli UTI 11/26/2019  . Diverticulitis 11/26/2019  . HLD (hyperlipidemia) 11/26/2019  . Fall 11/22/2019  . Microscopic colitis 09/18/2018  . Hemiparesis of right dominant side as late effect of nontraumatic intracerebral hemorrhage ([place]) 10/02/2017  . Expressive aphasia 09/22/2017  . Urinary retention 08/28/2017  . Essential hypertension   . Labile blood pressure   . Thalamic hemorrhage ([place]) 08/06/2017  . Hyperlipidemia   . Stage 3 chronic kidney disease ([place])   . At high risk for falls 02/12/2017  . Insomnia 02/12/2017  . Pulmonary nodules/lesions, multiple 01/29/2017  . Hypothyroidism 04/04/2015  . Compression fracture of L1 lumbar vertebra ([facility]) 02/23/2015  . Cholelithiases 02/23/2015  . Encounter for palliative care   . Chronic diastolic congestive heart failure ([place])   . Osteoporosis 05/13/2014  . Gout 08/16/2013  . Left atrial enlargement 01/10/2013  . A-fib ([place]) 12/08/2012  . Rosacea 05/15/2012  .  DEGENERATIVE DISC DISEASE, LUMBAR SPINE 07/14/2009  . GERD 07/11/2009  . PERSONAL HX COLONIC POLYPS 07/11/2009  . HYPERCHOLESTEROLEMIA 04/10/2006  . History of pulmonary embolism 04/10/2006  . DIVERTICULOSIS OF COLON 04/10/2006  . ACTINIC KERATOSIS 04/10/2006  . GEN OSTEOARTHROSIS INVOLVING MULTIPLE SITES 04/10/2006    Orientation RESPIRATION BLADDER Height & Weight     Self, Place  Normal Incontinent Weight:   Height:  5' 1"  (154.9 cm)  BEHAVIORAL SYMPTOMS/MOOD NEUROLOGICAL BOWEL NUTRITION STATUS      Continent Diet (Regular diet)  AMBULATORY STATUS COMMUNICATION OF NEEDS Skin   Limited Assist Verbally Normal                       Personal Care Assistance Level of Assistance  Bathing, Feeding, Dressing Bathing Assistance: Limited assistance Feeding assistance: Independent Dressing Assistance: Limited assistance     Functional Limitations Info  Sight, Hearing, Speech Sight Info: Adequate Hearing Info: Adequate Speech Info: Adequate    SPECIAL CARE FACTORS FREQUENCY  PT (By licensed PT), OT (By licensed OT)     PT Frequency: Minimum 5x a week OT Frequency: Minimum 5x a week            Contractures Contractures Info: Not present    Additional Factors Info  Code Status, Allergies Code Status Info: DNR Allergies Info: Iohexol Ivp Dye, Scallops, Shellfish-derived Products Sulfa Antibiotics           Current Medications (11/26/2019):  This is the current hospital active medication list Current Facility-Administered Medications  Medication Dose Route Frequency Provider Last Rate Last Admin  . 0.9 %  sodium chloride  infusion   Intravenous Continuous Allie Bossier, MD 75 mL/hr at 11/26/19 0902 New Bag at 11/26/19 0902  . acetaminophen (TYLENOL) tablet 1,000 mg  1,000 mg Oral Q8H Elodia Florence., MD   1,000 mg at 11/26/19 0457  . albuterol (PROVENTIL) (2.5 MG/3ML) 0.083% nebulizer solution 2.5 mg  2.5 mg Nebulization Q6H PRN Dahal, Binaya, MD      .  amiodarone (PACERONE) tablet 100 mg  100 mg Oral Daily Dahal, Binaya, MD   100 mg at 11/26/19 0900  . aspirin EC tablet 81 mg  81 mg Oral Daily Dahal, Marlowe Aschoff, MD   81 mg at 11/26/19 0859  . atorvastatin (LIPITOR) tablet 10 mg  10 mg Oral Daily Dahal, Binaya, MD   10 mg at 11/26/19 0900  . carvedilol (COREG) tablet 6.25 mg  6.25 mg Oral BID WC Quintella Reichert, MD   6.25 mg at 11/26/19 0901  . cefTRIAXone (ROCEPHIN) 2 g in sodium chloride 0.9 % 100 mL IVPB  2 g Intravenous Q24H Elodia Florence., MD 200 mL/hr at 11/25/19 2215 2 g at 11/25/19 2215  . docusate sodium (COLACE) capsule 100 mg  100 mg Oral QODAY Dahal, Marlowe Aschoff, MD   100 mg at 11/25/19 0919  . hydrALAZINE (APRESOLINE) injection 10 mg  10 mg Intravenous Q6H PRN Terrilee Croak, MD   10 mg at 11/23/19 1519  . influenza vaccine adjuvanted (FLUAD) injection 0.5 mL  0.5 mL Intramuscular Tomorrow-1000 Dahal, Binaya, MD      . ipratropium-albuterol (DUONEB) 0.5-2.5 (3) MG/3ML nebulizer solution 3 mL  3 mL Nebulization Q6H PRN Dahal, Binaya, MD      . levothyroxine (SYNTHROID) tablet 75 mcg  75 mcg Oral QAC breakfast Terrilee Croak, MD   75 mcg at 11/26/19 0457  . melatonin tablet 10 mg  10 mg Oral QHS PRN Vernelle Emerald, MD      . metroNIDAZOLE (FLAGYL) IVPB 500 mg  500 mg Intravenous Q8H Elodia Florence., MD 100 mL/hr at 11/26/19 1158 500 mg at 11/26/19 1158  . morphine 2 MG/ML injection 0.5 mg  0.5 mg Intravenous Q4H PRN Elodia Florence., MD      . ondansetron [facility]) injection 4 mg  4 mg Intravenous Q6H PRN Shalhoub, Sherryll Burger, MD      . oxyCODONE (Oxy IR/ROXICODONE) immediate release tablet 2.5 mg  2.5 mg Oral Q4H PRN Elodia Florence., MD       Or  . oxyCODONE (Oxy IR/ROXICODONE) immediate release tablet 5 mg  5 mg Oral Q4H PRN Elodia Florence., MD   5 mg at 11/26/19 0901  . pantoprazole (PROTONIX) EC tablet 40 mg  40 mg Oral Daily Dahal, Marlowe Aschoff, MD   40 mg at 11/26/19 0901  . polyethylene glycol (MIRALAX /  GLYCOLAX) packet 17 g  17 g Oral Daily PRN Dahal, Marlowe Aschoff, MD      . senna (SENOKOT) tablet 8.6 mg  1 tablet Oral BID Dahal, Binaya, MD   8.6 mg at 11/26/19 0900  . tiZANidine (ZANAFLEX) tablet 2 mg  2 mg Oral TID WC & HS Dahal, Binaya, MD   2 mg at 11/26/19 1202     Discharge Medications: Please see discharge summary for a list of discharge medications.  Relevant Imaging Results:  Relevant Lab Results:   Additional Information SSN [id number]  Ross Ludwig, LCSW

## 2019-11-26 NOTE — Progress Notes (Signed)
PROGRESS NOTE    Sara Russell  KGM:[id number] DOB: 1930-04-25 DOA: 11/22/2019 PCP: [name], DO     Brief Narrative:  Glenys Snader Sorgeis a 86 y.o.WF PMHx stroke in 2019 with residual right upper extremity weakness, expressive aphasia;HTN, HLD, persistent A. fib, DVT/PE, Diverticulosis, ulcerative colitis.  CKD stage IIIb   Patient is alert, awake and able to listen to conversation but has significant expressive aphasia. Her son at bedside helping with the history. Since her stroke in 2019, patient has been living in an assisted living facility. At baseline, she has expressive aphasia and weakness of right upper extremity but has no dysphagia and is able to ambulate with a walker. Inrespect to patient's wish, she is DNR/DNI and does not want any aggressive measures. 10/9, in the morning while trying to open the refrigerator, patient lost balance and fell on the floor. Staff attended immediately and did not notice any major injury. Her son visited in the afternoon and at that time, she complained of pain in the right shoulder and left hip. X-rays were done at the facility and were negative. Her pain got worse in the next 48 hours and hence sent to the ED.  She was seen at ED in [facility]. Multiple imagings were obtained.Significant findings being right scapula fracture,left pubic bone fracture and chronic compression fracture of L1 vertebrae.  ED physician discussed the case with orthopedics on-call Dr. Rolena Infante who recommended nonsurgical conservative management. Patient was accepted for admission last night but because of bed unavailability, patient arrived this afternoon only.  At the time of my evaluation, patient was alert, awake, comfortable. Son was at bedside and help all with the history.   Subjective: 10/15 patient was asleep.  Spoke with son at length concerning plan of care.  Per son patient had a restful night.   Assessment & Plan: Covid  vaccination; vaccinated   Active Problems:   A-fib ([place])   Chronic diastolic congestive heart failure ([place])   Essential hypertension   Fall   DVT (deep venous thrombosis) (HCC)   Pulmonary embolus (HCC)   Persistent atrial fibrillation (HCC)   Acute metabolic encephalopathy   Community acquired pneumonia   E. coli UTI   Diverticulitis   HLD (hyperlipidemia)   Acute Metabolic Encephalopathy -On 10/14 patient's encephalopathy had resolved.  Per her son on 10/15 patient's expressive aphasia is her baseline.   -Minimize sedating medication  Multiple fractures secondary to fall -Multiple imagings were obtained.Significant findings being right scapular fracture,left superior and inferior pubic rami fxs and severe compression fracture of L1 vertebrae with progression since 2017. -appreciate ortho recommendations - WBAT for superior and inferior L pubic ramus fx.  She'll be at increased risk of falls would benefit from therapy.  For R scapular fx, recommending WBAT, no need for formal PT or OT for the shoulder other than ADL work. -We will order for sling for right arm fracture. -10/15 DC morphine to sedating for 86 year old frail female. -10/15 Tylenol 650 mg QID first-line agent -10/15 Tramadol 50 mg PRN moderate pain -10/15 oxycodone PRN severe pain  -We will try to minimize narcotics as much as possible as we want patient to participate in PT, discussed with son at length and he concurs  RIGHT Basilar CAP? -Patient is on room air negative leukocytosis, negative bands, negative left shift doubt CAP, but given patient's frailty However will complete 5-day course antibiotics.  -Repeat CXR in 3 to 4 weeks to ensure resolution.  If it  has not resolved will obtain chest CT to ensure not neoplasm.  E. coli UTI -Completed 5-day course antibiotics  Early Acute Diverticulitis -CT pelvis W0 contrast consistent with early diverticulitis see results below -Complete 5-day course  antibiotics  E Coli UTI -Will cover above possible infections with ceftriaxone/flagyl.  Plan for 5 day course.  Persistent A. Fib -Not on anticoagulation at home (significant fall risk) -Amiodarone 100 mg daily -Aspirin 81 mg daily -Coreg 6.25 mg BID -Hydralazine 10 mg PRN - Essential HTN -See persistent A. fib  Hx stroke in 2019  -RIGHT upper extremity weakness -Expressive aphasia   HLD -Lipitor 10 mg daily -Lipid panel pending  CKD stage IIIb (baseline Cr~1.5) Lab Results  Component Value Date   CREATININE 1.71 (H) 11/26/2019   CREATININE 1.65 (H) 11/24/2019   CREATININE 1.69 (H) 11/22/2019   CREATININE 1.5 (A) 10/21/2019   CREATININE 1.5 (A) 04/28/2019  -10/15 normal saline 36m/hr   Hypothyroidism -Synthroid 75 mcg daily  Abnormal CT abdomen finding - Large L adnexal cyst, thought to be benign given long term stability, follow outpatient     Other home meds include Protonix 40 mg daily, Sodium chloride tablet 1 g twice daily   DVT prophylaxis: Heparin Code Status: DNR Family Communication: 10/15 son at bedside discussed plan of care answered all questions Status is: Inpatient    Dispo: The patient is from: Home              Anticipated d/c is to: SNF              Anticipated d/c date is:??              Patient currently unstable      Consultants:  Orthopedic surgery   Procedures/Significant Events:  10/11 CXR;-right basilar airspace opacity with associated trace pleural effusion suggestive of infection/inflammation.  -Followup PA and lateral chest X-ray is recommended in 3-4 weeks following trial of antibiotic therapy to ensure resolution and exclude underlying malignancy. -Large hiatal hernia.  10/11 CT pelvis W0 contrast;-fractures of the left superior and inferior pubic rami adjacent to the pubic symphysis -Large left adnexal cyst similar to that noted on a prior CT from2016. Given its long-term stability it is felt to be benign in  etiology. -Changes suggestive of early diverticulitis in the distal descending colon.    I have personally reviewed and interpreted all radiology studies and my findings are as above.  VENTILATOR SETTINGS: Room air 10/14 SPO2 98%   Cultures   Antimicrobials: Anti-infectives (From admission, onward)   Start     Ordered Stop   11/22/19 2030  cefTRIAXone (ROCEPHIN) 2 g in sodium chloride 0.9 % 100 mL IVPB        11/22/19 2022 11/26/19 2359   11/22/19 2030  metroNIDAZOLE (FLAGYL) IVPB 500 mg        11/22/19 2022 11/26/19 2359       Devices    LINES / TUBES:      Continuous Infusions: . cefTRIAXone (ROCEPHIN)  IV 2 g (11/25/19 2215)  . metronidazole 500 mg (11/26/19 0456)     Objective: Vitals:   11/25/19 0647 11/25/19 1309 11/25/19 2015 11/26/19 0411  BP: (!) 166/85 101/65 (!) 108/53 128/61  Pulse: 73 63 62 62  Resp: 20 16 20 18   Temp: (!) 97.1 F (36.2 C) 97.9 F (36.6 C) 98 F (36.7 C) 98.2 F (36.8 C)  TempSrc:  Oral Oral Oral  SpO2:  98% 98% 97%  Height:  Intake/Output Summary (Last 24 hours) at 11/26/2019 0733 Last data filed at 11/26/2019 0700 Gross per 24 hour  Intake --  Output 550 ml  Net -550 ml   There were no vitals filed for this visit.  Physical Exam:  General: Sleeping peacefully (just received pain medication) No acute respiratory distress Eyes: negative scleral hemorrhage, negative anisocoria, negative icterus ENT: Negative Runny nose, negative gingival bleeding, Neck:  Negative scars, masses, torticollis, lymphadenopathy, JVD Lungs: Clear to auscultation bilaterally without wheezes or crackles Cardiovascular: Regular rate and rhythm without murmur gallop or rub normal S1 and S2 Abdomen: negative abdominal pain, nondistended, positive soft, bowel sounds, no rebound, no ascites, no appreciable mass Extremities: No significant cyanosis, clubbing, or edema bilateral lower extremities Skin: Negative rashes, lesions,  ulcers Psychiatric: Unable to evaluate patient asleep Central nervous system: Unable to evaluate patient asleep   .     Data Reviewed: Care during the described time interval was provided by me .  I have reviewed this patient's available data, including medical history, events of note, physical examination, and all test results as part of my evaluation.  CBC: Recent Labs  Lab 11/22/19 1844 11/24/19 0559 11/25/19 0943 11/26/19 0404  WBC 14.5* 8.7 10.8* 6.6  NEUTROABS 12.6*  --  9.1* 5.1  HGB 10.8* 9.3* 10.5* 8.7*  HCT 33.8* 29.4* 32.8* 26.8*  MCV 91.8 92.5 92.7 91.5  PLT 223 221 249 299   Basic Metabolic Panel: Recent Labs  Lab 11/22/19 1844 11/24/19 0559 11/25/19 0943 11/26/19 0404  NA 136 134*  --  136  K 4.9 4.0  --  3.8  CL 103 104  --  106  CO2 24 20*  --  21*  GLUCOSE 117* 95  --  100*  BUN 33* 40*  --  54*  CREATININE 1.69* 1.65*  --  1.71*  CALCIUM 8.5* 7.8*  --  7.9*  MG  --   --  2.1 2.0  PHOS  --   --  2.4* 2.6   GFR: CrCl cannot be calculated (Unknown ideal weight.). Liver Function Tests: Recent Labs  Lab 11/22/19 1844 11/26/19 0404  AST 21 20  ALT 15 12  ALKPHOS 117 97  BILITOT 0.3 0.4  PROT 6.9 5.6*  ALBUMIN 3.1* 2.6*   No results for input(s): LIPASE, AMYLASE in the last 168 hours. No results for input(s): AMMONIA in the last 168 hours. Coagulation Profile: No results for input(s): INR, PROTIME in the last 168 hours. Cardiac Enzymes: Recent Labs  Lab 11/22/19 1844  CKTOTAL 32*   BNP (last 3 results) No results for input(s): PROBNP in the last 8760 hours. HbA1C: No results for input(s): HGBA1C in the last 72 hours. CBG: No results for input(s): GLUCAP in the last 168 hours. Lipid Profile: No results for input(s): CHOL, HDL, LDLCALC, TRIG, CHOLHDL, LDLDIRECT in the last 72 hours. Thyroid Function Tests: No results for input(s): TSH, T4TOTAL, FREET4, T3FREE, THYROIDAB in the last 72 hours. Anemia Panel: No results for input(s):  VITAMINB12, FOLATE, FERRITIN, TIBC, IRON, RETICCTPCT in the last 72 hours. Sepsis Labs: No results for input(s): PROCALCITON, LATICACIDVEN in the last 168 hours.  Recent Results (from the past 240 hour(s))  Respiratory Panel by RT PCR (Flu A&B, Covid) - Nasopharyngeal Swab     Status: None   Collection Time: 11/22/19  6:45 PM   Specimen: Nasopharyngeal Swab  Result Value Ref Range Status   SARS Coronavirus 2 by RT PCR NEGATIVE NEGATIVE Final    Comment: (NOTE)  SARS-CoV-2 target nucleic acids are NOT DETECTED.  The SARS-CoV-2 RNA is generally detectable in upper respiratoy specimens during the acute phase of infection. The lowest concentration of SARS-CoV-2 viral copies this assay can detect is 131 copies/mL. A negative result does not preclude SARS-Cov-2 infection and should not be used as the sole basis for treatment or other patient management decisions. A negative result may occur with  improper specimen collection/handling, submission of specimen other than nasopharyngeal swab, presence of viral mutation(s) within the areas targeted by this assay, and inadequate number of viral copies (<131 copies/mL). A negative result must be combined with clinical observations, patient history, and epidemiological information. The expected result is Negative.  Fact Sheet for Patients:  PinkCheek.be  Fact Sheet for Healthcare Providers:  GravelBags.it  This test is no t yet approved or cleared by the [place] FDA and  has been authorized for detection and/or diagnosis of SARS-CoV-2 by FDA under an Emergency Use Authorization (EUA). This EUA will remain  in effect (meaning this test can be used) for the duration of the COVID-19 declaration under Section 564(b)(1) of the Act, 21 U.S.C. section 360bbb-3(b)(1), unless the authorization is terminated or revoked sooner.     Influenza A by PCR NEGATIVE NEGATIVE Final   Influenza B  by PCR NEGATIVE NEGATIVE Final    Comment: (NOTE) The Xpert Xpress SARS-CoV-2/FLU/RSV assay is intended as an aid in  the diagnosis of influenza from Nasopharyngeal swab specimens and  should not be used as a sole basis for treatment. Nasal washings and  aspirates are unacceptable for Xpert Xpress SARS-CoV-2/FLU/RSV  testing.  Fact Sheet for Patients: PinkCheek.be  Fact Sheet for Healthcare Providers: GravelBags.it  This test is not yet approved or cleared by the [place] FDA and  has been authorized for detection and/or diagnosis of SARS-CoV-2 by  FDA under an Emergency Use Authorization (EUA). This EUA will remain  in effect (meaning this test can be used) for the duration of the  Covid-19 declaration under Section 564(b)(1) of the Act, 21  U.S.C. section 360bbb-3(b)(1), unless the authorization is  terminated or revoked. Performed at [facility], [address] [place], [place] [address]   Culture, blood (routine x 2)     Status: None (Preliminary result)   Collection Time: 11/22/19  8:53 PM   Specimen: BLOOD  Result Value Ref Range Status   Specimen Description   Final    BLOOD BLOOD LEFT FOREARM Performed at [facility], [address] [place], [place] [address]    Special Requests   Final    BOTTLES DRAWN AEROBIC AND ANAEROBIC Blood Culture adequate volume Performed at [facility], [address] [place], [place] [address]    Culture   Final    NO GROWTH 3 DAYS Performed at [facility], [place] [address], [place], [place] [address]    Report Status PENDING  Incomplete  Culture, blood (routine x 2)     Status: None (Preliminary result)   Collection Time: 11/22/19  8:56 PM   Specimen: BLOOD  Result Value Ref Range Status   Specimen Description   Final    BLOOD BLOOD RIGHT FOREARM Performed at [facility], [address] [place], [place] [address]      Special Requests   Final    BOTTLES DRAWN AEROBIC AND ANAEROBIC Blood Culture adequate volume Performed at [facility], [address] [place],  [place] [address]    Culture   Final    NO GROWTH 3 DAYS Performed at [facility], [place] [address], [place], [place] [address]    Report Status PENDING  Incomplete  Culture, Urine     Status: Abnormal   Collection Time: 11/22/19  9:10 PM   Specimen: Urine, Random  Result Value Ref Range Status   Specimen Description   Final    URINE, RANDOM Performed at [facility], [address] [place], [place] [address]    Special Requests   Final    NONE Performed at [facility], [address] [place], [place] [address]    Culture >=100,000 COLONIES/mL ESCHERICHIA COLI (A)  Final   Report Status 11/24/2019 FINAL  Final   Organism ID, Bacteria ESCHERICHIA COLI (A)  Final      Susceptibility   Escherichia coli - MIC*    AMPICILLIN >=32 RESISTANT Resistant     CEFAZOLIN <=4 SENSITIVE Sensitive     CEFTRIAXONE <=0.25 SENSITIVE Sensitive     CIPROFLOXACIN 0.5 SENSITIVE Sensitive     GENTAMICIN <=1 SENSITIVE Sensitive     IMIPENEM <=0.25 SENSITIVE Sensitive     NITROFURANTOIN <=16 SENSITIVE Sensitive     TRIMETH/SULFA >=320 RESISTANT Resistant     AMPICILLIN/SULBACTAM 16 INTERMEDIATE Intermediate     PIP/TAZO <=4 SENSITIVE Sensitive     * >=100,000 COLONIES/mL ESCHERICHIA COLI         Radiology Studies: No results found.      Scheduled Meds: . acetaminophen  1,000 mg Oral Q8H  . amiodarone  100 mg Oral Daily  . aspirin EC  81 mg Oral Daily  . atorvastatin  10 mg Oral Daily  . carvedilol  6.25 mg Oral BID WC  . docusate sodium  100 mg Oral QODAY  . influenza vaccine adjuvanted  0.5 mL Intramuscular Tomorrow-1000  . levothyroxine  75 mcg Oral QAC breakfast  . pantoprazole  40 mg Oral Daily  . senna  1 tablet Oral BID  . tiZANidine  2 mg Oral TID WC & HS   Continuous Infusions: .  cefTRIAXone (ROCEPHIN)  IV 2 g (11/25/19 2215)  . metronidazole 500 mg (11/26/19 0456)     LOS: 2 days    Time spent:40 min    Seylah Wernert, Geraldo Docker, MD [facility] Pager [phone]  If 7PM-7AM, please contact night-coverage [email or web address] Password [facility] 11/26/2019, 7:33 AM

## 2019-11-26 NOTE — Progress Notes (Signed)
Orthopedics Progress Note  Subjective: Patient reports some pain in the hip with movement. Productive sounding cough noted.   Objective:  Vitals:   11/25/19 2015 11/26/19 0411  BP: (!) 108/53 128/61  Pulse: 62 62  Resp: 20 18  Temp: 98 F (36.7 C) 98.2 F (36.8 C)  SpO2: 98% 97%    General: Awake and alert  Musculoskeletal: Right shoulder tender in the back. No deformity. Minimal swelling Limited use due to chronic shoulder weakness/cuff insufficiency and due to pain. Left leg no pain with gentle log roll Neurovascularly intact  Lab Results  Component Value Date   WBC 6.6 11/26/2019   HGB 8.7 (L) 11/26/2019   HCT 26.8 (L) 11/26/2019   MCV 91.5 11/26/2019   PLT 223 11/26/2019       Component Value Date/Time   NA 136 11/26/2019 0404   NA 138 10/21/2019 0000   K 3.8 11/26/2019 0404   CL 106 11/26/2019 0404   CO2 21 (L) 11/26/2019 0404   GLUCOSE 100 (H) 11/26/2019 0404   BUN 54 (H) 11/26/2019 0404   BUN 26 (A) 10/21/2019 0000   CREATININE 1.71 (H) 11/26/2019 0404   CREATININE 1.39 (H) 03/21/2015 0929   CALCIUM 7.9 (L) 11/26/2019 0404   GFRNONAA 26 (L) 11/26/2019 0404   GFRNONAA 54 (L) 05/18/2012 0839   GFRAA 36 (L) 02/11/2018 1548   GFRAA 63 05/18/2012 0839    Lab Results  Component Value Date   INR 1.24 08/02/2017   INR 1.24 03/20/2014   INR 1.01 07/06/2009    Assessment/Plan: Right scapula fracture - stable. No need for a sling while seated or in bed. May loosen or remove sling during transfers or while using a walker for balance. WBAT R UE WBAT left LE Mobilization as tolerated.  Her cough seems worse. Internal medicine following  Doran Heater. Veverly Fells, MD 11/26/2019 10:22 AM

## 2019-11-26 NOTE — Care Management Important Message (Signed)
Important Message  Patient Details IM Letter given to the Patient Name: Sara Russell MRN: [id number] Date of Birth: [birth year]   Medicare Important Message Given:  Yes     [name] 11/26/2019, 11:23 AM

## 2019-11-26 NOTE — Progress Notes (Signed)
PT Cancellation Note  Patient Details Name: Sara Russell MRN: [id number] DOB: [birth year]   Cancelled Treatment:    Reason Eval/Treat Not Completed: Medical issues which prohibited therapy Pt received pain meds around 12 pm and currently sleeping.  Pt not opening eyes and groans with attempt to reposition in bed (RN in room and aware).  Will check back as schedule permits.   Tykisha Areola,KATHrine E 11/26/2019, 2:33 PM Jannette Spanner PT, DPT Acute Rehabilitation Services Pager: [phone] Office: [phone]

## 2019-11-26 NOTE — TOC Initial Note (Signed)
Transition of Care [facility]) - Initial/Assessment Note    Patient Details  Name: Sara Russell MRN: [id number] Date of Birth: 1930/09/29  Transition of Care [facility]) CM/SW Contact:    Ross Ludwig, LCSW Phone Number: 11/26/2019, 5:03 PM  Clinical Narrative:                  Patient is an 86 year old female from [facility].  Patient had some confusion assessment completed by speaking to patient's son.  Patient's son states that she is at ALF and has some assistance at facility.  Patient was seen by therapy, and they are recommending SNF, CSW spoke to [place] at [facility] and they can accept patient this weekend in the SNF if she is medically ready.  CSW updated patient's son and informed him as well.  CSW to continue to facilitate discharge planning.  Expected Discharge Plan: Skilled Nursing Facility Barriers to Discharge: Continued Medical Work up   Patient Goals and CMS Choice Patient states their goals for this hospitalization and ongoing recovery are:: To go to [facility]'s rehab CMS Medicare.gov Compare Post Acute Care list provided to:: Patient Represenative (must comment) Choice offered to / list presented to : Adult Children  Expected Discharge Plan and Services Expected Discharge Plan: [place] In-house Referral: Clinical Social Work   Post Acute Care Choice: [place] Living arrangements for the past 2 months: [place] ([facility])                                      Prior Living Arrangements/Services Living arrangements for the past 2 months: [place] ([facility]) Lives with:: Facility Resident Patient language and need for interpreter reviewed:: Yes Do you feel safe going back to the place where you live?: Yes      Need for Family Participation in Patient Care: Yes (Comment) Care giver support system in place?: Yes (comment) Current home services: Homehealth aide Criminal  Activity/Legal Involvement Pertinent to Current Situation/Hospitalization: No - Comment as needed  Activities of Daily Living Home Assistive Devices/Equipment: Gilford Rile (specify type) ADL Screening (condition at time of admission) Patient's cognitive ability adequate to safely complete daily activities?: No Is the patient deaf or have difficulty hearing?: No Does the patient have difficulty seeing, even when wearing glasses/contacts?: No Does the patient have difficulty concentrating, remembering, or making decisions?: Yes Patient able to express need for assistance with ADLs?: Yes Does the patient have difficulty dressing or bathing?: Yes Independently performs ADLs?: No Communication: Independent with device (comment) Does the patient have difficulty walking or climbing stairs?: Yes Weakness of Legs: Right Weakness of Arms/Hands: Right  Permission Sought/Granted Permission sought to share information with : Facility Sport and exercise psychologist, Family Supports Permission granted to share information with : Yes, Release of Information Signed  Share Information with NAME: Shakiera, Edelson [phone]  [phone]  Permission granted to share info w AGENCY: [facility]        Emotional Assessment Appearance:: Appears stated age   Affect (typically observed): Calm, Accepting, Stable Orientation: : Oriented to Self, Oriented to Place Alcohol / Substance Use: Not Applicable Psych Involvement: No (comment)  Admission diagnosis:  Pneumonia [J18.9] Fall [W19.XXXA] Fall, initial encounter [id number]] Closed nondisplaced fracture of body of right scapula, initial encounter [S42.114A] Acute diverticulitis [K57.92] Closed fracture of right scapula, unspecified part of scapula, initial encounter [S42.101A] Other  closed fracture of left pubis, initial encounter ([place]) [S32.592A] Closed compression fracture of body of L1 vertebra ([place]) [S32.010A] Patient Active Problem List   Diagnosis Date  Noted  . DVT (deep venous thrombosis) ([place]) 11/26/2019  . Pulmonary embolus ([place]) 11/26/2019  . Persistent atrial fibrillation ([place]) 11/26/2019  . Acute metabolic encephalopathy 84/66/5993  . Community acquired pneumonia 11/26/2019  . E. coli UTI 11/26/2019  . Diverticulitis 11/26/2019  . HLD (hyperlipidemia) 11/26/2019  . Fall 11/22/2019  . Microscopic colitis 09/18/2018  . Hemiparesis of right dominant side as late effect of nontraumatic intracerebral hemorrhage ([place]) 10/02/2017  . Expressive aphasia 09/22/2017  . Urinary retention 08/28/2017  . Essential hypertension   . Labile blood pressure   . Thalamic hemorrhage ([place]) 08/06/2017  . Hyperlipidemia   . Stage 3 chronic kidney disease ([place])   . At high risk for falls 02/12/2017  . Insomnia 02/12/2017  . Pulmonary nodules/lesions, multiple 01/29/2017  . Hypothyroidism 04/04/2015  . Compression fracture of L1 lumbar vertebra (HCC) 02/23/2015  . Cholelithiases 02/23/2015  . Encounter for palliative care   . Chronic diastolic congestive heart failure ([place])   . Osteoporosis 05/13/2014  . Gout 08/16/2013  . Left atrial enlargement 01/10/2013  . A-fib ([place]) 12/08/2012  . Rosacea 05/15/2012  . DEGENERATIVE DISC DISEASE, LUMBAR SPINE 07/14/2009  . GERD 07/11/2009  . PERSONAL HX COLONIC POLYPS 07/11/2009  . HYPERCHOLESTEROLEMIA 04/10/2006  . History of pulmonary embolism 04/10/2006  . DIVERTICULOSIS OF COLON 04/10/2006  . ACTINIC KERATOSIS 04/10/2006  . GEN OSTEOARTHROSIS INVOLVING MULTIPLE SITES 04/10/2006   PCP:  [name], DO Pharmacy:   [facility], [address] [place] [address] Phone: [phone] Fax: [phone]     Social Determinants of Health (SDOH) Interventions    Readmission Risk Interventions No flowsheet data found.

## 2019-11-27 DIAGNOSIS — R197 Diarrhea, unspecified: Secondary | ICD-10-CM | POA: Diagnosis present

## 2019-11-27 LAB — COMPREHENSIVE METABOLIC PANEL
ALT: 10 U/L (ref 0–44)
AST: 16 U/L (ref 15–41)
Albumin: 2.6 g/dL — ABNORMAL LOW (ref 3.5–5.0)
Alkaline Phosphatase: 103 U/L (ref 38–126)
Anion gap: 11 (ref 5–15)
BUN: 49 mg/dL — ABNORMAL HIGH (ref 8–23)
CO2: 19 mmol/L — ABNORMAL LOW (ref 22–32)
Calcium: 7.8 mg/dL — ABNORMAL LOW (ref 8.9–10.3)
Chloride: 109 mmol/L (ref 98–111)
Creatinine, Ser: 1.62 mg/dL — ABNORMAL HIGH (ref 0.44–1.00)
GFR, Estimated: 28 mL/min — ABNORMAL LOW (ref >60)
Glucose, Bld: 104 mg/dL — ABNORMAL HIGH (ref 70–99)
Potassium: 3.4 mmol/L — ABNORMAL LOW (ref 3.5–5.1)
Sodium: 139 mmol/L (ref 135–145)
Total Bilirubin: 0.4 mg/dL (ref 0.3–1.2)
Total Protein: 5.8 g/dL — ABNORMAL LOW (ref 6.5–8.1)

## 2019-11-27 LAB — CBC WITH DIFFERENTIAL/PLATELET
Abs Immature Granulocytes: 0.04 10*3/uL (ref 0.00–0.07)
Basophils Absolute: 0 10*3/uL (ref 0.0–0.1)
Basophils Relative: 0 %
Eosinophils Absolute: 0.1 10*3/uL (ref 0.0–0.5)
Eosinophils Relative: 1 %
HCT: 28.2 % — ABNORMAL LOW (ref 36.0–46.0)
Hemoglobin: 9 g/dL — ABNORMAL LOW (ref 12.0–15.0)
Immature Granulocytes: 0 %
Lymphocytes Relative: 8 %
Lymphs Abs: 0.8 10*3/uL (ref 0.7–4.0)
MCH: 29.5 pg (ref 26.0–34.0)
MCHC: 31.9 g/dL (ref 30.0–36.0)
MCV: 92.5 fL (ref 80.0–100.0)
Monocytes Absolute: 0.9 10*3/uL (ref 0.1–1.0)
Monocytes Relative: 9 %
Neutro Abs: 8.2 10*3/uL — ABNORMAL HIGH (ref 1.7–7.7)
Neutrophils Relative %: 82 %
Platelets: 217 10*3/uL (ref 150–400)
RBC: 3.05 MIL/uL — ABNORMAL LOW (ref 3.87–5.11)
RDW: 13.8 % (ref 11.5–15.5)
WBC: 10.2 10*3/uL (ref 4.0–10.5)
nRBC: 0 % (ref 0.0–0.2)

## 2019-11-27 LAB — LIPID PANEL
Cholesterol: 92 mg/dL (ref 0–200)
HDL: 30 mg/dL — ABNORMAL LOW (ref >40)
LDL Cholesterol: 46 mg/dL (ref 0–99)
Total CHOL/HDL Ratio: 3.1 RATIO
Triglycerides: 82 mg/dL (ref <150)
VLDL: 16 mg/dL (ref 0–40)

## 2019-11-27 LAB — MAGNESIUM: Magnesium: 2 mg/dL (ref 1.7–2.4)

## 2019-11-27 LAB — CULTURE, BLOOD (ROUTINE X 2)
Culture: NO GROWTH
Culture: NO GROWTH
Special Requests: ADEQUATE
Special Requests: ADEQUATE

## 2019-11-27 LAB — RESPIRATORY PANEL BY RT PCR (FLU A&B, COVID)
Influenza A by PCR: NEGATIVE
Influenza B by PCR: NEGATIVE
SARS Coronavirus 2 by RT PCR: NEGATIVE

## 2019-11-27 LAB — PHOSPHORUS: Phosphorus: 2.2 mg/dL — ABNORMAL LOW (ref 2.5–4.6)

## 2019-11-27 MED ORDER — TRAMADOL HCL 50 MG PO TABS: 50.0000 mg | ORAL_TABLET | Freq: Four times a day (QID) | ORAL | Status: DC | PRN

## 2019-11-27 MED ORDER — ACETAMINOPHEN 325 MG PO TABS
650.0000 mg | ORAL_TABLET | Freq: Four times a day (QID) | ORAL | Status: DC
  Administered 2019-11-27 – 2019-11-29 (×7): 650 mg via ORAL
  Filled 2019-11-27 (×8): qty 2

## 2019-11-27 MED ORDER — POTASSIUM CHLORIDE CRYS ER 20 MEQ PO TBCR
50.0000 meq | EXTENDED_RELEASE_TABLET | Freq: Once | ORAL | Status: AC
  Administered 2019-11-27: 50 meq via ORAL
  Filled 2019-11-27: qty 1

## 2019-11-27 NOTE — Progress Notes (Signed)
PROGRESS NOTE    Sara Russell  CHE:[id number] DOB: 1930/12/11 DOA: 11/22/2019 PCP: [name], DO     Brief Narrative:  Sara Russell a 86 y.o.WF PMHx stroke in 2019 with residual right upper extremity weakness, expressive aphasia;HTN, HLD, persistent A. fib, DVT/PE, Diverticulosis, ulcerative colitis.  CKD stage IIIb   Patient is alert, awake and able to listen to conversation but has significant expressive aphasia. Her son at bedside helping with the history. Since her stroke in 2019, patient has been living in an assisted living facility. At baseline, she has expressive aphasia and weakness of right upper extremity but has no dysphagia and is able to ambulate with a walker. Inrespect to patient's wish, she is DNR/DNI and does not want any aggressive measures. 10/9, in the morning while trying to open the refrigerator, patient lost balance and fell on the floor. Staff attended immediately and did not notice any major injury. Her son visited in the afternoon and at that time, she complained of pain in the right shoulder and left hip. X-rays were done at the facility and were negative. Her pain got worse in the next 48 hours and hence sent to the ED.  She was seen at ED in [facility]. Multiple imagings were obtained.Significant findings being right scapula fracture,left pubic bone fracture and chronic compression fracture of L1 vertebrae.  ED physician discussed the case with orthopedics on-call Dr. Rolena Infante who recommended nonsurgical conservative management. Patient was accepted for admission last night but because of bed unavailability, patient arrived this afternoon only.  At the time of my evaluation, patient was alert, awake, comfortable. Son was at bedside and help all with the history.   Subjective: 10/16 night patient developed diarrhea.  Currently A/O x4 working with physical therapy sitting on the edge of the bed.  This is causing patient  extreme pain but she is participating.   Assessment & Plan: Covid vaccination; vaccinated   Active Problems:   A-fib ([place])   Chronic diastolic congestive heart failure ([place])   Essential hypertension   Fall   DVT (deep venous thrombosis) ([facility])   Pulmonary embolus ([facility])   Persistent atrial fibrillation ([facility])   Acute metabolic encephalopathy   Community acquired pneumonia   E. coli UTI   Diverticulitis   HLD (hyperlipidemia)   Acute Metabolic Encephalopathy -On 10/14 patient's encephalopathy had resolved.  Per her son on 10/15 patient's expressive aphasia is her baseline.   -Minimize sedating medication  Multiple fractures secondary to fall -Multiple imagings were obtained.Significant findings being right scapular fracture,left superior and inferior pubic rami fxs and severe compression fracture of L1 vertebrae with progression since 2017. -appreciate ortho recommendations - WBAT for superior and inferior L pubic ramus fx.  She'll be at increased risk of falls would benefit from therapy.  For R scapular fx, recommending WBAT, no need for formal PT or OT for the shoulder other than ADL work. -We will order for sling for right arm fracture. -10/15 DC morphine to sedating for 86 year old frail female. -10/15 Tylenol 650 mg QID first-line agent -10/15 Tramadol 50 mg PRN moderate pain -10/15 oxycodone PRN severe pain  -We will try to minimize narcotics as much as possible as we want patient to participate in PT, discussed with son at length and he concurs  RIGHT Basilar CAP? -Patient is on room air negative leukocytosis, negative bands, negative left shift doubt CAP, but given patient's frailty However will complete 5-day course antibiotics.  -Repeat CXR in 3  to 4 weeks to ensure resolution.  If it has not resolved will obtain chest CT to ensure not neoplasm.  E. coli UTI -Completed 5-day course antibiotics  Early Acute Diverticulitis -CT pelvis W0 contrast consistent with  early diverticulitis see results below -Complete 5-day course antibiotics  Persistent A. Fib -Not on anticoagulation at home (significant fall risk) -Amiodarone 100 mg daily -Aspirin 81 mg daily -Coreg 6.25 mg BID -Hydralazine 10 mg PRN - Essential HTN -See persistent A. fib  Hx stroke in 2019  -RIGHT upper extremity weakness -Expressive aphasia   HLD -Lipitor 10 mg daily -Lipid panel pending  CKD stage IIIb (baseline Cr~1.5) Lab Results  Component Value Date   CREATININE 1.62 (H) 11/27/2019   CREATININE 1.71 (H) 11/26/2019   CREATININE 1.65 (H) 11/24/2019   CREATININE 1.69 (H) 11/22/2019   CREATININE 1.5 (A) 10/21/2019  -10/15 normal saline 7m/hr   Hypothyroidism -Synthroid 75 mcg daily  Abnormal CT abdomen finding - Large L adnexal cyst, thought to be benign given long term stability, follow outpatient   Hypokalemia -Potassium goal> 4 -10/16 K-Dur 50 mEq  Diarrhea iatrogenic  -Patient has been on multiple medications for constipation secondary to her pain medication for her multiple fractures now having moderate to severe constipation.  Have discontinued medication. -Spoke at length with son explained we will not give medication to stop the diarrhea because patient still taking pain medication for her multiple fractures which she requires, should stop on its own.     Other home meds include Protonix 40 mg daily, Sodium chloride tablet 1 g twice daily   DVT prophylaxis: Heparin Code Status: DNR Family Communication: 10/16 son at bedside discussed plan of care answered all questions Status is: Inpatient    Dispo: The patient is from: Home              Anticipated d/c is to: SNF              Anticipated d/c date is:??              Patient currently unstable      Consultants:  Orthopedic surgery   Procedures/Significant Events:  10/11 CXR;-right basilar airspace opacity with associated trace pleural effusion suggestive of  infection/inflammation.  -Followup PA and lateral chest X-ray is recommended in 3-4 weeks following trial of antibiotic therapy to ensure resolution and exclude underlying malignancy. -Large hiatal hernia.  10/11 CT pelvis W0 contrast;-fractures of the left superior and inferior pubic rami adjacent to the pubic symphysis -Large left adnexal cyst similar to that noted on a prior CT from2016. Given its long-term stability it is felt to be benign in etiology. -Changes suggestive of early diverticulitis in the distal descending colon.    I have personally reviewed and interpreted all radiology studies and my findings are as above.  VENTILATOR SETTINGS: Room air 10/14 SPO2 98%   Cultures 10/16 SARS coronavirus negative 10/16 influenza A/B negative   Antimicrobials: Anti-infectives (From admission, onward)   Start     Ordered Stop   11/22/19 2030  cefTRIAXone (ROCEPHIN) 2 g in sodium chloride 0.9 % 100 mL IVPB        11/22/19 2022 11/26/19 2359   11/22/19 2030  metroNIDAZOLE (FLAGYL) IVPB 500 mg        11/22/19 2022 11/26/19 2359       Devices    LINES / TUBES:      Continuous Infusions: . sodium chloride 75 mL/hr at 11/27/19 0600     Objective:  Vitals:   11/26/19 1246 11/26/19 1300 11/26/19 2325 11/27/19 0504  BP: (!) 100/56  (!) 111/59 (!) 156/68  Pulse: 71  66 67  Resp: (!) 25 20 20 18   Temp: 97.6 F (36.4 C)  98.8 F (37.1 C) 98 F (36.7 C)  TempSrc: Oral  Oral Oral  SpO2: 94%  96% 96%  Height:        Intake/Output Summary (Last 24 hours) at 11/27/2019 0744 Last data filed at 11/27/2019 0600 Gross per 24 hour  Intake 1614.98 ml  Output 450 ml  Net 1164.98 ml   There were no vitals filed for this visit.  Physical Exam:  General: A/O x4, No acute respiratory distress Eyes: negative scleral hemorrhage, negative anisocoria, negative icterus ENT: Negative Runny nose, negative gingival bleeding, Neck:  Negative scars, masses, torticollis,  lymphadenopathy, JVD Lungs: Clear to auscultation bilaterally without wheezes or crackles Cardiovascular: Regular rate and rhythm without murmur gallop or rub normal S1 and S2 Abdomen: negative abdominal pain, nondistended, positive soft, bowel sounds, no rebound, no ascites, no appreciable mass Extremities: pain to abduction in upper extremities especially right arm (son states previous injury so patient has been favoring for some time), pain with any movement of bilateral lower extremities secondary to multiple fractures skin: Negative rashes, lesions, ulcers Psychiatric:  Negative depression, negative anxiety, negative fatigue, negative mania  Central nervous system:  Cranial nerves II through XII intact, tongue/uvula midline, all extremities muscle strength 5/5, sensation intact throughout, negative dysarthria, negative expressive aphasia, negative receptive aphasia.    .     Data Reviewed: Care during the described time interval was provided by me .  I have reviewed this patient's available data, including medical history, events of note, physical examination, and all test results as part of my evaluation.  CBC: Recent Labs  Lab 11/22/19 1844 11/24/19 0559 11/25/19 0943 11/26/19 0404 11/27/19 0553  WBC 14.5* 8.7 10.8* 6.6 10.2  NEUTROABS 12.6*  --  9.1* 5.1 8.2*  HGB 10.8* 9.3* 10.5* 8.7* 9.0*  HCT 33.8* 29.4* 32.8* 26.8* 28.2*  MCV 91.8 92.5 92.7 91.5 92.5  PLT 223 221 249 223 878   Basic Metabolic Panel: Recent Labs  Lab 11/22/19 1844 11/24/19 0559 11/25/19 0943 11/26/19 0404 11/27/19 0553  NA 136 134*  --  136 139  K 4.9 4.0  --  3.8 3.4*  CL 103 104  --  106 109  CO2 24 20*  --  21* 19*  GLUCOSE 117* 95  --  100* 104*  BUN 33* 40*  --  54* 49*  CREATININE 1.69* 1.65*  --  1.71* 1.62*  CALCIUM 8.5* 7.8*  --  7.9* 7.8*  MG  --   --  2.1 2.0 2.0  PHOS  --   --  2.4* 2.6 2.2*   GFR: CrCl cannot be calculated (Unknown ideal weight.). Liver Function  Tests: Recent Labs  Lab 11/22/19 1844 11/26/19 0404 11/27/19 0553  AST 21 20 16   ALT 15 12 10   ALKPHOS 117 97 103  BILITOT 0.3 0.4 0.4  PROT 6.9 5.6* 5.8*  ALBUMIN 3.1* 2.6* 2.6*   No results for input(s): LIPASE, AMYLASE in the last 168 hours. No results for input(s): AMMONIA in the last 168 hours. Coagulation Profile: No results for input(s): INR, PROTIME in the last 168 hours. Cardiac Enzymes: Recent Labs  Lab 11/22/19 1844  CKTOTAL 32*   BNP (last 3 results) No results for input(s): PROBNP in the last 8760 hours. HbA1C: No results  for input(s): HGBA1C in the last 72 hours. CBG: No results for input(s): GLUCAP in the last 168 hours. Lipid Profile: Recent Labs    11/27/19 0553  CHOL 92  HDL 30*  LDLCALC 46  TRIG 82  CHOLHDL 3.1   Thyroid Function Tests: No results for input(s): TSH, T4TOTAL, FREET4, T3FREE, THYROIDAB in the last 72 hours. Anemia Panel: No results for input(s): VITAMINB12, FOLATE, FERRITIN, TIBC, IRON, RETICCTPCT in the last 72 hours. Sepsis Labs: No results for input(s): PROCALCITON, LATICACIDVEN in the last 168 hours.  Recent Results (from the past 240 hour(s))  Respiratory Panel by RT PCR (Flu A&B, Covid) - Nasopharyngeal Swab     Status: None   Collection Time: 11/22/19  6:45 PM   Specimen: Nasopharyngeal Swab  Result Value Ref Range Status   SARS Coronavirus 2 by RT PCR NEGATIVE NEGATIVE Final    Comment: (NOTE) SARS-CoV-2 target nucleic acids are NOT DETECTED.  The SARS-CoV-2 RNA is generally detectable in upper respiratoy specimens during the acute phase of infection. The lowest concentration of SARS-CoV-2 viral copies this assay can detect is 131 copies/mL. A negative result does not preclude SARS-Cov-2 infection and should not be used as the sole basis for treatment or other patient management decisions. A negative result may occur with  improper specimen collection/handling, submission of specimen other than nasopharyngeal  swab, presence of viral mutation(s) within the areas targeted by this assay, and inadequate number of viral copies (<131 copies/mL). A negative result must be combined with clinical observations, patient history, and epidemiological information. The expected result is Negative.  Fact Sheet for Patients:  PinkCheek.be  Fact Sheet for Healthcare Providers:  GravelBags.it  This test is no t yet approved or cleared by the [place] FDA and  has been authorized for detection and/or diagnosis of SARS-CoV-2 by FDA under an Emergency Use Authorization (EUA). This EUA will remain  in effect (meaning this test can be used) for the duration of the COVID-19 declaration under Section 564(b)(1) of the Act, 21 U.S.C. section 360bbb-3(b)(1), unless the authorization is terminated or revoked sooner.     Influenza A by PCR NEGATIVE NEGATIVE Final   Influenza B by PCR NEGATIVE NEGATIVE Final    Comment: (NOTE) The Xpert Xpress SARS-CoV-2/FLU/RSV assay is intended as an aid in  the diagnosis of influenza from Nasopharyngeal swab specimens and  should not be used as a sole basis for treatment. Nasal washings and  aspirates are unacceptable for Xpert Xpress SARS-CoV-2/FLU/RSV  testing.  Fact Sheet for Patients: PinkCheek.be  Fact Sheet for Healthcare Providers: GravelBags.it  This test is not yet approved or cleared by the [place] FDA and  has been authorized for detection and/or diagnosis of SARS-CoV-2 by  FDA under an Emergency Use Authorization (EUA). This EUA will remain  in effect (meaning this test can be used) for the duration of the  Covid-19 declaration under Section 564(b)(1) of the Act, 21  U.S.C. section 360bbb-3(b)(1), unless the authorization is  terminated or revoked. Performed at [facility], [address] [place], [place] [address]    Culture, blood (routine x 2)     Status: None   Collection Time: 11/22/19  8:53 PM   Specimen: BLOOD  Result Value Ref Range Status   Specimen Description   Final    BLOOD BLOOD LEFT FOREARM Performed at [facility], [address], [place], [place] [address]    Special Requests   Final  BOTTLES DRAWN AEROBIC AND ANAEROBIC Blood Culture adequate volume Performed at [facility], [address] [place], [place] [address]    Culture   Final    NO GROWTH 5 DAYS Performed at [facility], [place] [address] [place], [place] [address]    Report Status 11/27/2019 FINAL  Final  Culture, blood (routine x 2)     Status: None   Collection Time: 11/22/19  8:56 PM   Specimen: BLOOD  Result Value Ref Range Status   Specimen Description   Final    BLOOD BLOOD RIGHT FOREARM Performed at [facility], [address] [place], [place] [address]    Special Requests   Final    BOTTLES DRAWN AEROBIC AND ANAEROBIC Blood Culture adequate volume Performed at [facility], [address] [place], [place] [address]    Culture   Final    NO GROWTH 5 DAYS Performed at [facility], [place] [address], [place], [place] [address]    Report Status 11/27/2019 FINAL  Final  Culture, Urine     Status: Abnormal   Collection Time: 11/22/19  9:10 PM   Specimen: Urine, Random  Result Value Ref Range Status   Specimen Description   Final    URINE, RANDOM Performed at [facility], [address] [place], [place] [address]    Special Requests   Final    NONE Performed at [facility], [address] [place], [place] [address]    Culture >=100,000 COLONIES/mL ESCHERICHIA COLI (A)  Final   Report Status 11/24/2019 FINAL  Final   Organism ID, Bacteria ESCHERICHIA COLI (A)  Final      Susceptibility   Escherichia coli - MIC*    AMPICILLIN >=32 RESISTANT Resistant     CEFAZOLIN <=4 SENSITIVE Sensitive     CEFTRIAXONE <=0.25  SENSITIVE Sensitive     CIPROFLOXACIN 0.5 SENSITIVE Sensitive     GENTAMICIN <=1 SENSITIVE Sensitive     IMIPENEM <=0.25 SENSITIVE Sensitive     NITROFURANTOIN <=16 SENSITIVE Sensitive     TRIMETH/SULFA >=320 RESISTANT Resistant     AMPICILLIN/SULBACTAM 16 INTERMEDIATE Intermediate     PIP/TAZO <=4 SENSITIVE Sensitive     * >=100,000 COLONIES/mL ESCHERICHIA COLI         Radiology Studies: No results found.      Scheduled Meds: . acetaminophen  650 mg Oral Q6H  . amiodarone  100 mg Oral Daily  . aspirin EC  81 mg Oral Daily  . atorvastatin  10 mg Oral Daily  . carvedilol  6.25 mg Oral BID WC  . docusate sodium  100 mg Oral QODAY  . influenza vaccine adjuvanted  0.5 mL Intramuscular Tomorrow-1000  . levothyroxine  75 mcg Oral QAC breakfast  . pantoprazole  40 mg Oral Daily  . senna  1 tablet Oral BID  . tiZANidine  2 mg Oral TID WC & HS   Continuous Infusions: . sodium chloride 75 mL/hr at 11/27/19 0600     LOS: 3 days    Time spent:40 min    Haiden Clucas, Geraldo Docker, MD [facility] Pager [phone]  If 7PM-7AM, please contact night-coverage [email or web address] Password [facility] 11/27/2019, 7:44 AM

## 2019-11-27 NOTE — Plan of Care (Signed)
Patient with multiple stools throughout shift, all stool softeners/laxatives discontinued by MD.  Sara Russell at bedside.  Patient did eat a sandwich and drink an ensure for her son.  Medicated for pain x 2 with improvement.

## 2019-11-27 NOTE — Plan of Care (Signed)
  Problem: Nutrition: Goal: Adequate nutrition will be maintained Outcome: Progressing   

## 2019-11-27 NOTE — Progress Notes (Signed)
Physical Therapy Treatment Patient Details Name: Sara Russell MRN: [id number] DOB: 02/26/1930 Today's Date: 11/27/2019    History of Present Illness 86 y.o. female with PMH significant for stroke in 2019 with residual right upper extremity weakness, expressive aphasia; hypertension, hyperlipidemia, persistent A. fib, DVT/PE, diverticulosis, ulcerative colitis.  Pt admitted after fall and sustained Left superior and inferior pubic ramus fracture and new R coracoid top fracture and scapula body fracture which are minimally displaced with plan for conservative management.    PT Comments    Pt was limited today due to pain and constant diarrhea (nursing aware, medication induced).  Pt with expressive aphasia and was unable to localize pain.  She yelled with movements of any joints-even those without injury.  While sitting EOB kept knees extended and unable to keep feet on floor. She did sit EOB for several minutes, participated with some exercises , and participated in multiple rolls with cues.  Cont POC as able.    Follow Up Recommendations  SNF     Equipment Recommendations  None recommended by PT    Recommendations for Other Services       Precautions / Restrictions Precautions Precautions: Fall Precaution Comments: hx expressive aphasia and CVA R Hemi, no need for R UE sling per Dr. Veverly Fells Restrictions RLE Weight Bearing: Weight bearing as tolerated Other Position/Activity Restrictions: WBAT R UE and L LE    Mobility  Bed Mobility Overal bed mobility: Needs Assistance Bed Mobility: Supine to Sit;Sit to Supine;Rolling Rolling: Max assist;+2 for physical assistance   Supine to sit: Max assist;+2 for physical assistance Sit to supine: Max assist;+2 for physical assistance   General bed mobility comments: Limited by pain requiring max A x 2 for all.  Pt did do some reaching with L UE and use of trunk but otherwise max A and use of bed pad to facilitate  Transfers                  General transfer comment: Unable to attempt standing due to pain, yelling with any movement, and constant diarrhea (RN reports pt given numerous meds for constipation and now has diarrhea)  Ambulation/Gait                 Stairs             Wheelchair Mobility    Modified Rankin (Stroke Patients Only)       Balance Overall balance assessment: Needs assistance Sitting-balance support: Bilateral upper extremity supported Sitting balance-Leahy Scale: Poor Sitting balance - Comments: Required UE support and close guarding.  Frequently shifting weight - unable to vocalize but likely due to pelvic pain.  Did sit EOB for at least 10 mins but was unable to stand or pivot to chair due to pain and diarrhea                                    Cognition Arousal/Alertness: Awake/alert Behavior During Therapy: Anxious Overall Cognitive Status: Difficult to assess (difficult to assess due to aphasia)                                 General Comments: hx expressive aphasia, pt able to follow commands      Exercises General Exercises - Lower Extremity Ankle Circles/Pumps: AAROM;Right;5 reps;Seated Long Arc Quad: AAROM;Right;10 reps;Seated;Left;5 reps    General  Comments        Pertinent Vitals/Pain Pain Assessment: Faces Faces Pain Scale: Hurts whole lot Pain Location: Generalized: Pt would grimace and yell with any movement - unable to localize, even yelled with R knee ext - no injuries on R leg Pain Descriptors / Indicators: Grimacing;Moaning Pain Intervention(s): Limited activity within patient's tolerance;Monitored during session;Premedicated before session;Repositioned    Home Living                      Prior Function            PT Goals (current goals can now be found in the care plan section) Acute Rehab PT Goals PT Goal Formulation: With patient/family Time For Goal Achievement: 12/08/19 Potential to  Achieve Goals: Good Progress towards PT goals: Progressing toward goals    Frequency    Min 3X/week      PT Plan Current plan remains appropriate    Co-evaluation              AM-PAC PT "6 Clicks" Mobility   Outcome Measure  Help needed turning from your back to your side while in a flat bed without using bedrails?: A Lot Help needed moving from lying on your back to sitting on the side of a flat bed without using bedrails?: A Lot Help needed moving to and from a bed to a chair (including a wheelchair)?: Total Help needed standing up from a chair using your arms (e.g., wheelchair or bedside chair)?: Total Help needed to walk in hospital room?: Total Help needed climbing 3-5 steps with a railing? : Total 6 Click Score: 8    End of Session   Activity Tolerance: Patient limited by pain (limited by pain and diarrhea) Patient left: in bed;with call bell/phone within reach;with bed alarm set;with family/visitor present Nurse Communication: Mobility status;Need for lift equipment PT Visit Diagnosis: Other abnormalities of gait and mobility (R26.89);Unsteadiness on feet (R26.81)     Time: 9753-0051 PT Time Calculation (min) (ACUTE ONLY): 29 min  Charges:  $Therapeutic Activity: 23-37 mins                     Abran Richard, PT Acute Rehab Services Pager [phone] [facility] [phone]     Karlton Lemon 11/27/2019, 2:32 PM

## 2019-11-28 LAB — CBC WITH DIFFERENTIAL/PLATELET
Abs Immature Granulocytes: 0.03 10*3/uL (ref 0.00–0.07)
Basophils Absolute: 0 10*3/uL (ref 0.0–0.1)
Basophils Relative: 0 %
Eosinophils Absolute: 0.2 10*3/uL (ref 0.0–0.5)
Eosinophils Relative: 2 %
HCT: 26.9 % — ABNORMAL LOW (ref 36.0–46.0)
Hemoglobin: 8.5 g/dL — ABNORMAL LOW (ref 12.0–15.0)
Immature Granulocytes: 0 %
Lymphocytes Relative: 7 %
Lymphs Abs: 0.5 10*3/uL — ABNORMAL LOW (ref 0.7–4.0)
MCH: 29.4 pg (ref 26.0–34.0)
MCHC: 31.6 g/dL (ref 30.0–36.0)
MCV: 93.1 fL (ref 80.0–100.0)
Monocytes Absolute: 0.6 10*3/uL (ref 0.1–1.0)
Monocytes Relative: 9 %
Neutro Abs: 5.7 10*3/uL (ref 1.7–7.7)
Neutrophils Relative %: 82 %
Platelets: 206 10*3/uL (ref 150–400)
RBC: 2.89 MIL/uL — ABNORMAL LOW (ref 3.87–5.11)
RDW: 14.1 % (ref 11.5–15.5)
WBC: 7.1 10*3/uL (ref 4.0–10.5)
nRBC: 0 % (ref 0.0–0.2)

## 2019-11-28 LAB — COMPREHENSIVE METABOLIC PANEL WITH GFR
ALT: 10 U/L (ref 0–44)
AST: 14 U/L — ABNORMAL LOW (ref 15–41)
Albumin: 2.4 g/dL — ABNORMAL LOW (ref 3.5–5.0)
Alkaline Phosphatase: 95 U/L (ref 38–126)
Anion gap: 8 (ref 5–15)
BUN: 29 mg/dL — ABNORMAL HIGH (ref 8–23)
CO2: 18 mmol/L — ABNORMAL LOW (ref 22–32)
Calcium: 8 mg/dL — ABNORMAL LOW (ref 8.9–10.3)
Chloride: 114 mmol/L — ABNORMAL HIGH (ref 98–111)
Creatinine, Ser: 1.25 mg/dL — ABNORMAL HIGH (ref 0.44–1.00)
GFR, Estimated: 39 mL/min — ABNORMAL LOW
Glucose, Bld: 93 mg/dL (ref 70–99)
Potassium: 4.1 mmol/L (ref 3.5–5.1)
Sodium: 140 mmol/L (ref 135–145)
Total Bilirubin: 0.5 mg/dL (ref 0.3–1.2)
Total Protein: 5.4 g/dL — ABNORMAL LOW (ref 6.5–8.1)

## 2019-11-28 LAB — MAGNESIUM: Magnesium: 1.8 mg/dL (ref 1.7–2.4)

## 2019-11-28 LAB — PHOSPHORUS: Phosphorus: 1.7 mg/dL — ABNORMAL LOW (ref 2.5–4.6)

## 2019-11-28 MED ORDER — SODIUM PHOSPHATES 45 MMOLE/15ML IV SOLN
20.0000 mmol | Freq: Once | INTRAVENOUS | Status: AC
  Administered 2019-11-28: 20 mmol via INTRAVENOUS
  Filled 2019-11-28: qty 6.67

## 2019-11-28 MED ORDER — SENNA 8.6 MG PO TABS: 1.0000 | ORAL_TABLET | Freq: Every day | ORAL | Status: DC | PRN

## 2019-11-28 MED ORDER — MAGNESIUM SULFATE 2 GM/50ML IV SOLN
2.0000 g | Freq: Once | INTRAVENOUS | Status: AC
  Administered 2019-11-28: 2 g via INTRAVENOUS
  Filled 2019-11-28: qty 50

## 2019-11-28 MED ORDER — SODIUM CHLORIDE 0.9 % IV SOLN: INTRAVENOUS | Status: DC

## 2019-11-28 NOTE — Progress Notes (Signed)
Patients son expressed concerns about pending discharge for tomorrow. [name] requested call from [phone]. Son took belongings home.

## 2019-11-28 NOTE — Progress Notes (Signed)
PROGRESS NOTE    Sara Russell  QQP:[id number] DOB: 26-Jan-1931 DOA: 11/22/2019 PCP: Sara Curry, DO     Brief Narrative:  Sara Russell a 86 y.o.WF PMHx stroke in 2019 with residual right upper extremity weakness, expressive aphasia;HTN, HLD, persistent A. fib, DVT/PE, Diverticulosis, ulcerative colitis.  CKD stage IIIb   Patient is alert, awake and able to listen to conversation but has significant expressive aphasia. Her son at bedside helping with the history. Since her stroke in 2019, patient has been living in an assisted living facility. At baseline, she has expressive aphasia and weakness of right upper extremity but has no dysphagia and is able to ambulate with a walker. Inrespect to patient's wish, she is DNR/DNI and does not want any aggressive measures. 10/9, in the morning while trying to open the refrigerator, patient lost balance and fell on the floor. Staff attended immediately and did not notice any major injury. Her son visited in the afternoon and at that time, she complained of pain in the right shoulder and left hip. X-rays were done at the facility and were negative. Her pain got worse in the next 48 hours and hence sent to the ED.  She was seen at ED in [facility]. Multiple imagings were obtained.Significant findings being right scapula fracture,left pubic bone fracture and chronic compression fracture of L1 vertebrae.  ED physician discussed the case with orthopedics on-call Dr. Rolena Infante who recommended nonsurgical conservative management. Patient was accepted for admission last night but because of bed unavailability, patient arrived this afternoon only.  At the time of my evaluation, patient was alert, awake, comfortable. Son was at bedside and help all with the history.   Subjective: 10/17 afebrile overnight A/O x4, states only mild diarrhea today significantly improved.     Assessment & Plan: Covid vaccination;  vaccinated   Active Problems:   A-fib ([place])   Chronic diastolic congestive heart failure ([place])   Essential hypertension   Fall   DVT (deep venous thrombosis) ([facility])   Pulmonary embolus ([facility])   Persistent atrial fibrillation ([facility])   Acute metabolic encephalopathy   Community acquired pneumonia   E. coli UTI   Diverticulitis   HLD (hyperlipidemia)   Diarrhea, unspecified   Acute Metabolic Encephalopathy -On 10/14 patient's encephalopathy had resolved.  Per her son on 10/15 patient's expressive aphasia is her baseline.   -Minimize sedating medication  Multiple fractures secondary to fall -Multiple imagings were obtained.Significant findings being right scapular fracture,left superior and inferior pubic rami fxs and severe compression fracture of L1 vertebrae with progression since 2017. -appreciate ortho recommendations - WBAT for superior and inferior L pubic ramus fx.  She'll be at increased risk of falls would benefit from therapy.  For R scapular fx, recommending WBAT, no need for formal PT or OT for the shoulder other than ADL work. -We will order for sling for right arm fracture. -10/15 DC morphine to sedating for 86 year old frail female. -10/15 Tylenol 650 mg QID first-line agent -10/15 Tramadol 50 mg PRN moderate pain -10/15 oxycodone PRN severe pain  -We will try to minimize narcotics as much as possible as we want patient to participate in PT, discussed with son at length and he concurs  RIGHT Basilar CAP? -Patient is on room air negative leukocytosis, negative bands, negative left shift doubt CAP, but given patient's frailty However will complete 5-day course antibiotics.  -Repeat CXR in 3 to 4 weeks to ensure resolution.  If it has not resolved will  obtain chest CT to ensure not neoplasm.  E. coli UTI -Completed 5-day course antibiotics  Early Acute Diverticulitis -CT pelvis W0 contrast consistent with early diverticulitis see results below -Complete 5-day  course antibiotics  Persistent A. Fib -Not on anticoagulation at home (significant fall risk) -Amiodarone 100 mg daily -Aspirin 81 mg daily -Coreg 6.25 mg BID -Hydralazine 10 mg PRN - Essential HTN -See persistent A. fib  Hx stroke in 2019  -RIGHT upper extremity weakness -Expressive aphasia   HLD -Lipitor 10 mg daily -Lipid panel pending  CKD stage IIIb (baseline Cr~1.5) Lab Results  Component Value Date   CREATININE 1.25 (H) 11/28/2019   CREATININE 1.62 (H) 11/27/2019   CREATININE 1.71 (H) 11/26/2019   CREATININE 1.65 (H) 11/24/2019   CREATININE 1.69 (H) 11/22/2019  -10/17 normal saline KVO   Hypothyroidism -Synthroid 75 mcg daily  Abnormal CT abdomen finding - Large L adnexal cyst, thought to be benign given long term stability, follow outpatient   Hypokalemia -Potassium goal> 4  Hypomagnesmia -Magnesium goal> 2 -10/17 Magnesium IV 2 g  Hypophosphatemia -Phosphorus goal> 2.5 -10/17 sodium phosphate 20 mmol  Diarrhea iatrogenic  -Patient has been on multiple medications for constipation secondary to her pain medication for her multiple fractures now having moderate to severe constipation.  Have discontinued medication. -Spoke at length with son explained we will not give medication to stop the diarrhea because patient still taking pain medication for her multiple fractures which she requires, should stop on its own. -10/17 significantly improved after discontinuation of all laxatives.  Patient should be ready for discharge in the a.m.     DVT prophylaxis: Heparin Code Status: DNR Family Communication: 10/17 son at bedside discussed plan of care answered all questions Status is: Inpatient    Dispo: The patient is from: Home              Anticipated d/c is to: SNF              Anticipated d/c date is:??              Patient currently unstable      Consultants:  Orthopedic surgery   Procedures/Significant Events:  10/11 CXR;-right basilar  airspace opacity with associated trace pleural effusion suggestive of infection/inflammation.  -Followup PA and lateral chest X-ray is recommended in 3-4 weeks following trial of antibiotic therapy to ensure resolution and exclude underlying malignancy. -Large hiatal hernia.  10/11 CT pelvis W0 contrast;-fractures of the left superior and inferior pubic rami adjacent to the pubic symphysis -Large left adnexal cyst similar to that noted on a prior CT from2016. Given its long-term stability it is felt to be benign in etiology. -Changes suggestive of early diverticulitis in the distal descending colon.    I have personally reviewed and interpreted all radiology studies and my findings are as above.  VENTILATOR SETTINGS: Room air 10/17 SPO2 97%   Cultures 10/16 SARS coronavirus negative 10/16 influenza A/B negative   Antimicrobials: Anti-infectives (From admission, onward)   Start     Ordered Stop   11/22/19 2030  cefTRIAXone (ROCEPHIN) 2 g in sodium chloride 0.9 % 100 mL IVPB        11/22/19 2022 11/26/19 2359   11/22/19 2030  metroNIDAZOLE (FLAGYL) IVPB 500 mg        11/22/19 2022 11/26/19 2359       Devices    LINES / TUBES:      Continuous Infusions: . sodium chloride 75 mL/hr at 11/27/19 1015  Objective: Vitals:   11/27/19 0504 11/27/19 1207 11/27/19 2103 11/28/19 0616  BP: (!) 156/68 (!) 165/66 133/67 (!) 167/74  Pulse: 67 72 66 67  Resp: 18 (!) 21 16 18   Temp: 98 F (36.7 C) (!) 97.5 F (36.4 C) 98.2 F (36.8 C) 98 F (36.7 C)  TempSrc: Oral Oral    SpO2: 96% 97% 95% 97%  Height:        Intake/Output Summary (Last 24 hours) at 11/28/2019 1337 Last data filed at 11/28/2019 0900 Gross per 24 hour  Intake 1727.39 ml  Output 0 ml  Net 1727.39 ml   There were no vitals filed for this visit.  Physical Exam:  General: A/O x4, No acute respiratory distress Eyes: negative scleral hemorrhage, negative anisocoria, negative icterus ENT: Negative  Runny nose, negative gingival bleeding, Neck:  Negative scars, masses, torticollis, lymphadenopathy, JVD Lungs: Clear to auscultation bilaterally without wheezes or crackles Cardiovascular: Regular rate and rhythm without murmur gallop or rub normal S1 and S2 Abdomen: negative abdominal pain, nondistended, positive soft, bowel sounds, no rebound, no ascites, no appreciable mass Extremities: pain to abduction in upper extremities especially right arm (son states previous injury so patient has been favoring for some time), pain with any movement of bilateral lower extremities secondary to multiple fractures skin: Negative rashes, lesions, ulcers Psychiatric:  Negative depression, negative anxiety, negative fatigue, negative mania  Central nervous system:  Cranial nerves II through XII intact, tongue/uvula midline, all extremities muscle strength 5/5, sensation intact throughout, negative dysarthria, negative expressive aphasia, negative receptive aphasia.    .     Data Reviewed: Care during the described time interval was provided by me .  I have reviewed this patient's available data, including medical history, events of note, physical examination, and all test results as part of my evaluation.  CBC: Recent Labs  Lab 11/22/19 1844 11/22/19 1844 11/24/19 0559 11/25/19 0943 11/26/19 0404 11/27/19 0553 11/28/19 0504  WBC 14.5*   < > 8.7 10.8* 6.6 10.2 7.1  NEUTROABS 12.6*  --   --  9.1* 5.1 8.2* 5.7  HGB 10.8*   < > 9.3* 10.5* 8.7* 9.0* 8.5*  HCT 33.8*   < > 29.4* 32.8* 26.8* 28.2* 26.9*  MCV 91.8   < > 92.5 92.7 91.5 92.5 93.1  PLT 223   < > 221 249 223 217 206   < > = values in this interval not displayed.   Basic Metabolic Panel: Recent Labs  Lab 11/22/19 1844 11/24/19 0559 11/25/19 0943 11/26/19 0404 11/27/19 0553 11/28/19 0504  NA 136 134*  --  136 139 140  K 4.9 4.0  --  3.8 3.4* 4.1  CL 103 104  --  106 109 114*  CO2 24 20*  --  21* 19* 18*  GLUCOSE 117* 95  --   100* 104* 93  BUN 33* 40*  --  54* 49* 29*  CREATININE 1.69* 1.65*  --  1.71* 1.62* 1.25*  CALCIUM 8.5* 7.8*  --  7.9* 7.8* 8.0*  MG  --   --  2.1 2.0 2.0 1.8  PHOS  --   --  2.4* 2.6 2.2* 1.7*   GFR: CrCl cannot be calculated (Unknown ideal weight.). Liver Function Tests: Recent Labs  Lab 11/22/19 1844 11/26/19 0404 11/27/19 0553 11/28/19 0504  AST 21 20 16  14*  ALT 15 12 10 10   ALKPHOS 117 97 103 95  BILITOT 0.3 0.4 0.4 0.5  PROT 6.9 5.6* 5.8* 5.4*  ALBUMIN 3.1*  2.6* 2.6* 2.4*   No results for input(s): LIPASE, AMYLASE in the last 168 hours. No results for input(s): AMMONIA in the last 168 hours. Coagulation Profile: No results for input(s): INR, PROTIME in the last 168 hours. Cardiac Enzymes: Recent Labs  Lab 11/22/19 1844  CKTOTAL 32*   BNP (last 3 results) No results for input(s): PROBNP in the last 8760 hours. HbA1C: No results for input(s): HGBA1C in the last 72 hours. CBG: No results for input(s): GLUCAP in the last 168 hours. Lipid Profile: Recent Labs    11/27/19 0553  CHOL 92  HDL 30*  LDLCALC 46  TRIG 82  CHOLHDL 3.1   Thyroid Function Tests: No results for input(s): TSH, T4TOTAL, FREET4, T3FREE, THYROIDAB in the last 72 hours. Anemia Panel: No results for input(s): VITAMINB12, FOLATE, FERRITIN, TIBC, IRON, RETICCTPCT in the last 72 hours. Sepsis Labs: No results for input(s): PROCALCITON, LATICACIDVEN in the last 168 hours.  Recent Results (from the past 240 hour(s))  Respiratory Panel by RT PCR (Flu A&B, Covid) - Nasopharyngeal Swab     Status: None   Collection Time: 11/22/19  6:45 PM   Specimen: Nasopharyngeal Swab  Result Value Ref Range Status   SARS Coronavirus 2 by RT PCR NEGATIVE NEGATIVE Final    Comment: (NOTE) SARS-CoV-2 target nucleic acids are NOT DETECTED.  The SARS-CoV-2 RNA is generally detectable in upper respiratoy specimens during the acute phase of infection. The lowest concentration of SARS-CoV-2 viral copies this  assay can detect is 131 copies/mL. A negative result does not preclude SARS-Cov-2 infection and should not be used as the sole basis for treatment or other patient management decisions. A negative result may occur with  improper specimen collection/handling, submission of specimen other than nasopharyngeal swab, presence of viral mutation(s) within the areas targeted by this assay, and inadequate number of viral copies (<131 copies/mL). A negative result must be combined with clinical observations, patient history, and epidemiological information. The expected result is Negative.  Fact Sheet for Patients:  PinkCheek.be  Fact Sheet for Healthcare Providers:  GravelBags.it  This test is no t yet approved or cleared by the [place] FDA and  has been authorized for detection and/or diagnosis of SARS-CoV-2 by FDA under an Emergency Use Authorization (EUA). This EUA will remain  in effect (meaning this test can be used) for the duration of the COVID-19 declaration under Section 564(b)(1) of the Act, 21 U.S.C. section 360bbb-3(b)(1), unless the authorization is terminated or revoked sooner.     Influenza A by PCR NEGATIVE NEGATIVE Final   Influenza B by PCR NEGATIVE NEGATIVE Final    Comment: (NOTE) The Xpert Xpress SARS-CoV-2/FLU/RSV assay is intended as an aid in  the diagnosis of influenza from Nasopharyngeal swab specimens and  should not be used as a sole basis for treatment. Nasal washings and  aspirates are unacceptable for Xpert Xpress SARS-CoV-2/FLU/RSV  testing.  Fact Sheet for Patients: PinkCheek.be  Fact Sheet for Healthcare Providers: GravelBags.it  This test is not yet approved or cleared by the [place] FDA and  has been authorized for detection and/or diagnosis of SARS-CoV-2 by  FDA under an Emergency Use Authorization (EUA). This EUA will  remain  in effect (meaning this test can be used) for the duration of the  Covid-19 declaration under Section 564(b)(1) of the Act, 21  U.S.C. section 360bbb-3(b)(1), unless the authorization is  terminated or revoked. Performed at [facility], [address], [place], [place] [address]  Culture, blood (routine x 2)     Status: None   Collection Time: 11/22/19  8:53 PM   Specimen: BLOOD  Result Value Ref Range Status   Specimen Description   Final    BLOOD BLOOD LEFT FOREARM Performed at [facility], [address] [place], [place] [address]    Special Requests   Final    BOTTLES DRAWN AEROBIC AND ANAEROBIC Blood Culture adequate volume Performed at [facility], [address] [place], [place] [address]    Culture   Final    NO GROWTH 5 DAYS Performed at [facility], [place] [address] [place], [place] [address]    Report Status 11/27/2019 FINAL  Final  Culture, blood (routine x 2)     Status: None   Collection Time: 11/22/19  8:56 PM   Specimen: BLOOD  Result Value Ref Range Status   Specimen Description   Final    BLOOD BLOOD RIGHT FOREARM Performed at [facility], [address] [place], [place] [address]    Special Requests   Final    BOTTLES DRAWN AEROBIC AND ANAEROBIC Blood Culture adequate volume Performed at [facility], [address] [place], [place] [address]    Culture   Final    NO GROWTH 5 DAYS Performed at [facility], [place] [address], [place], [place] [address]    Report Status 11/27/2019 FINAL  Final  Culture, Urine     Status: Abnormal   Collection Time: 11/22/19  9:10 PM   Specimen: Urine, Random  Result Value Ref Range Status   Specimen Description   Final    URINE, RANDOM Performed at [facility], [place], [place] [address]    Special Requests   Final    NONE Performed at [facility], [address] [place], [place] [address]     Culture >=100,000 COLONIES/mL ESCHERICHIA COLI (A)  Final   Report Status 11/24/2019 FINAL  Final   Organism ID, Bacteria ESCHERICHIA COLI (A)  Final      Susceptibility   Escherichia coli - MIC*    AMPICILLIN >=32 RESISTANT Resistant     CEFAZOLIN <=4 SENSITIVE Sensitive     CEFTRIAXONE <=0.25 SENSITIVE Sensitive     CIPROFLOXACIN 0.5 SENSITIVE Sensitive     GENTAMICIN <=1 SENSITIVE Sensitive     IMIPENEM <=0.25 SENSITIVE Sensitive     NITROFURANTOIN <=16 SENSITIVE Sensitive     TRIMETH/SULFA >=320 RESISTANT Resistant     AMPICILLIN/SULBACTAM 16 INTERMEDIATE Intermediate     PIP/TAZO <=4 SENSITIVE Sensitive     * >=100,000 COLONIES/mL ESCHERICHIA COLI  Respiratory Panel by RT PCR (Flu A&B, Covid) - Nasopharyngeal Swab     Status: None   Collection Time: 11/27/19 12:23 PM   Specimen: Nasopharyngeal Swab  Result Value Ref Range Status   SARS Coronavirus 2 by RT PCR NEGATIVE NEGATIVE Final    Comment: (NOTE) SARS-CoV-2 target nucleic acids are NOT DETECTED.  The SARS-CoV-2 RNA is generally detectable in upper respiratoy specimens during the acute phase of infection. The lowest concentration of SARS-CoV-2 viral copies this assay can detect is 131 copies/mL. A negative result does not preclude SARS-Cov-2 infection and should not be used as the sole basis for treatment or other patient management decisions. A negative result may occur with  improper specimen collection/handling, submission of specimen other than nasopharyngeal swab, presence of viral mutation(s)  within the areas targeted by this assay, and inadequate number of viral copies (<131 copies/mL). A negative result must be combined with clinical observations, patient history, and epidemiological information. The expected result is Negative.  Fact Sheet for Patients:  PinkCheek.be  Fact Sheet for Healthcare Providers:  GravelBags.it  This test is no t yet approved  or cleared by the [place] FDA and  has been authorized for detection and/or diagnosis of SARS-CoV-2 by FDA under an Emergency Use Authorization (EUA). This EUA will remain  in effect (meaning this test can be used) for the duration of the COVID-19 declaration under Section 564(b)(1) of the Act, 21 U.S.C. section 360bbb-3(b)(1), unless the authorization is terminated or revoked sooner.     Influenza A by PCR NEGATIVE NEGATIVE Final   Influenza B by PCR NEGATIVE NEGATIVE Final    Comment: (NOTE) The Xpert Xpress SARS-CoV-2/FLU/RSV assay is intended as an aid in  the diagnosis of influenza from Nasopharyngeal swab specimens and  should not be used as a sole basis for treatment. Nasal washings and  aspirates are unacceptable for Xpert Xpress SARS-CoV-2/FLU/RSV  testing.  Fact Sheet for Patients: PinkCheek.be  Fact Sheet for Healthcare Providers: GravelBags.it  This test is not yet approved or cleared by the [place] FDA and  has been authorized for detection and/or diagnosis of SARS-CoV-2 by  FDA under an Emergency Use Authorization (EUA). This EUA will remain  in effect (meaning this test can be used) for the duration of the  Covid-19 declaration under Section 564(b)(1) of the Act, 21  U.S.C. section 360bbb-3(b)(1), unless the authorization is  terminated or revoked. Performed at [facility], [place] [address], [place],  [address]          Radiology Studies: No results found.      Scheduled Meds: . acetaminophen  650 mg Oral Q6H  . amiodarone  100 mg Oral Daily  . aspirin EC  81 mg Oral Daily  . atorvastatin  10 mg Oral Daily  . carvedilol  6.25 mg Oral BID WC  . influenza vaccine adjuvanted  0.5 mL Intramuscular Tomorrow-1000  . levothyroxine  75 mcg Oral QAC breakfast  . pantoprazole  40 mg Oral Daily  . tiZANidine  2 mg Oral TID WC & HS   Continuous Infusions: .  sodium chloride 75 mL/hr at 11/27/19 1015     LOS: 4 days    Time spent:40 min    Ethan Kasperski, Geraldo Docker, MD [facility] Pager [phone]  If 7PM-7AM, please contact night-coverage [email or web address] Password [email or web address] 11/28/2019, 1:37 PM

## 2019-11-29 DIAGNOSIS — E7800 Pure hypercholesterolemia, unspecified: Secondary | ICD-10-CM

## 2019-11-29 DIAGNOSIS — I5032 Chronic diastolic (congestive) heart failure: Secondary | ICD-10-CM

## 2019-11-29 DIAGNOSIS — G9341 Metabolic encephalopathy: Secondary | ICD-10-CM

## 2019-11-29 DIAGNOSIS — K5792 Diverticulitis of intestine, part unspecified, without perforation or abscess without bleeding: Secondary | ICD-10-CM

## 2019-11-29 LAB — COMPREHENSIVE METABOLIC PANEL
ALT: 11 U/L (ref 0–44)
AST: 16 U/L (ref 15–41)
Albumin: 2.3 g/dL — ABNORMAL LOW (ref 3.5–5.0)
Alkaline Phosphatase: 97 U/L (ref 38–126)
Anion gap: 13 (ref 5–15)
BUN: 27 mg/dL — ABNORMAL HIGH (ref 8–23)
CO2: 17 mmol/L — ABNORMAL LOW (ref 22–32)
Calcium: 7.9 mg/dL — ABNORMAL LOW (ref 8.9–10.3)
Chloride: 108 mmol/L (ref 98–111)
Creatinine, Ser: 1.2 mg/dL — ABNORMAL HIGH (ref 0.44–1.00)
GFR, Estimated: 41 mL/min — ABNORMAL LOW (ref >60)
Glucose, Bld: 99 mg/dL (ref 70–99)
Potassium: 3.8 mmol/L (ref 3.5–5.1)
Sodium: 138 mmol/L (ref 135–145)
Total Bilirubin: 0.4 mg/dL (ref 0.3–1.2)
Total Protein: 5.4 g/dL — ABNORMAL LOW (ref 6.5–8.1)

## 2019-11-29 LAB — CBC WITH DIFFERENTIAL/PLATELET
Abs Immature Granulocytes: 0.05 10*3/uL (ref 0.00–0.07)
Basophils Absolute: 0 10*3/uL (ref 0.0–0.1)
Basophils Relative: 1 %
Eosinophils Absolute: 0.3 10*3/uL (ref 0.0–0.5)
Eosinophils Relative: 4 %
HCT: 28.4 % — ABNORMAL LOW (ref 36.0–46.0)
Hemoglobin: 9.2 g/dL — ABNORMAL LOW (ref 12.0–15.0)
Immature Granulocytes: 1 %
Lymphocytes Relative: 9 %
Lymphs Abs: 0.6 10*3/uL — ABNORMAL LOW (ref 0.7–4.0)
MCH: 30.1 pg (ref 26.0–34.0)
MCHC: 32.4 g/dL (ref 30.0–36.0)
MCV: 92.8 fL (ref 80.0–100.0)
Monocytes Absolute: 0.7 10*3/uL (ref 0.1–1.0)
Monocytes Relative: 10 %
Neutro Abs: 5.6 10*3/uL (ref 1.7–7.7)
Neutrophils Relative %: 75 %
Platelets: 257 10*3/uL (ref 150–400)
RBC: 3.06 MIL/uL — ABNORMAL LOW (ref 3.87–5.11)
RDW: 14.1 % (ref 11.5–15.5)
WBC: 7.3 10*3/uL (ref 4.0–10.5)
nRBC: 0 % (ref 0.0–0.2)

## 2019-11-29 LAB — PHOSPHORUS: Phosphorus: 2.9 mg/dL (ref 2.5–4.6)

## 2019-11-29 LAB — MAGNESIUM: Magnesium: 2.1 mg/dL (ref 1.7–2.4)

## 2019-11-29 MED ORDER — HYDRALAZINE HCL 10 MG PO TABS
10.0000 mg | ORAL_TABLET | Freq: Three times a day (TID) | ORAL | Status: DC
  Administered 2019-11-29: 10 mg via ORAL
  Filled 2019-11-29: qty 1

## 2019-11-29 MED ORDER — SENNA 8.6 MG PO TABS: 1.0000 | ORAL_TABLET | Freq: Every day | ORAL | 0 refills | Status: AC | PRN

## 2019-11-29 MED ORDER — OXYCODONE HCL 5 MG PO TABS
5.0000 mg | ORAL_TABLET | ORAL | 0 refills | Status: DC | PRN
Start: 2019-11-29 — End: 2019-12-13

## 2019-11-29 MED ORDER — ACETAMINOPHEN 325 MG PO TABS
650.0000 mg | ORAL_TABLET | Freq: Four times a day (QID) | ORAL | 0 refills | Status: DC
Start: 2019-11-29 — End: 2020-04-22

## 2019-11-29 MED ORDER — MELATONIN 10 MG PO TABS: 10.0000 mg | ORAL_TABLET | Freq: Every evening | ORAL | 0 refills | Status: AC | PRN

## 2019-11-29 MED ORDER — ASPIRIN 81 MG PO TBEC: 81.0000 mg | DELAYED_RELEASE_TABLET | Freq: Every day | ORAL | 0 refills | Status: AC

## 2019-11-29 MED ORDER — TRAMADOL HCL 50 MG PO TABS
50.0000 mg | ORAL_TABLET | Freq: Four times a day (QID) | ORAL | 0 refills | Status: DC | PRN
Start: 2019-11-29 — End: 2020-01-05

## 2019-11-29 MED ORDER — OXYCODONE HCL 5 MG PO TABS: 2.5000 mg | ORAL_TABLET | ORAL | 0 refills | Status: AC | PRN

## 2019-11-29 MED ORDER — HYDRALAZINE HCL 10 MG PO TABS
10.0000 mg | ORAL_TABLET | Freq: Three times a day (TID) | ORAL | 0 refills | Status: DC
Start: 2019-11-29 — End: 2019-12-24

## 2019-11-29 MED ORDER — TIZANIDINE HCL 2 MG PO TABS: 2.0000 mg | ORAL_TABLET | Freq: Three times a day (TID) | ORAL | 0 refills | Status: AC

## 2019-11-29 NOTE — TOC Transition Note (Addendum)
Transition of Care [facility]) - CM/SW Discharge Note   Patient Details  Name: DAVEAH VARONE MRN: [id number] Date of Birth: 1931-01-08  [facility] CM/SW Contact:  Ross Ludwig, LCSW Phone Number: 11/29/2019, 10:47 AM   Clinical Narrative:    Patient to be d/c'ed today to [facility] room 156.  Patient and family agreeable to plans will transport via ems RN to call report to [phone].  Patient's son [name] requested a phone call [phone] once EMS arrives.  Final next level of care: Skilled Nursing Facility Barriers to Discharge: Barriers Resolved   Patient Goals and CMS Choice Patient states their goals for this hospitalization and ongoing recovery are:: To go to SNF for short term rehab, then return back home. CMS Medicare.gov Compare Post Acute Care list provided to:: Patient Represenative (must comment) Choice offered to / list presented to : Patient  Discharge Placement   Existing PASRR number confirmed : 11/25/19          Patient chooses bed at: [facility] Patient to be transferred to facility by: [facility] Name of family member notified: [name] patient's sont Patient and family notified of of transfer: 11/29/19  Discharge Plan and Services In-house Referral: Clinical Social Work   Post Acute Care Choice: [place]                               Social Determinants of Health (SDOH) Interventions     Readmission Risk Interventions No flowsheet data found.

## 2019-11-29 NOTE — Care Management Important Message (Signed)
Important Message  Patient Details IM Letter given to the Patient Name: Sara Russell MRN: [id number] Date of Birth: [birth year]   Medicare Important Message Given:  Yes     [name] 11/29/2019, 10:44 AM

## 2019-11-29 NOTE — Progress Notes (Signed)
Spoke with Norberta Keens at [facility]  and gave report about the pt.

## 2019-11-29 NOTE — Discharge Summary (Signed)
Physician Discharge Summary  BROOKLIN RIEGER HDQ:[id number] DOB: 03/26/1930 DOA: 11/22/2019  PCP: [name], DO  Admit date: 11/22/2019 Discharge date: 11/29/2019  Time spent: 35 minutes  Recommendations for Outpatient Follow-up:  Covid vaccination; vaccinated  Acute Metabolic Encephalopathy -On 10/14 patient's encephalopathy had resolved.  Per her son on 10/15 patient's expressive aphasia is her baseline.   -Minimize sedating medication  Multiple fractures secondary to fall -Multiple imagings were obtained.Significant findings being right scapularfracture,leftsuperior and inferior pubic rami fxsand severecompression fracture of L1 vertebraewith progression since 2017. -appreciate ortho recommendations - WBAT for superior and inferior L pubic ramus fx. She'll be at increased risk of falls would benefit from therapy. For R scapular fx, recommending WBAT, no need for formal PT or OT for the shoulder other than ADL work. -We will order for sling for right arm fracture. -10/15 Tylenol 650 mg QID first-line agent -10/15 Tramadol 50 mg PRN moderate pain -10/15 oxycodone PRN severe pain  -We will try to minimize narcotics as much as possible as we want patient to participate in PT, discussed with son at length and he concurs -Per Dr. Wiliam Ke orthopedic surgery No ortho follow up needed after discharge unless PT needs new prescription for therapy  RIGHT Basilar CAP? -Patient is on room air negative leukocytosis, negative bands, negative left shift doubt CAP, but given patient's frailty However completed 5-day course antibiotics.  -Repeat CXR in 3 to 4 weeks to ensure resolution.  If it has not resolved will obtain chest CT to ensure not neoplasm. -Follow-up with DO Tiffany Reed in 3 to 4 weeks CXR evaluate RIGHT basilar CAP vs neoplasm  E. coli UTI -Completed 5-day course antibiotics  Early Acute Diverticulitis -CT pelvis W0 contrast consistent with early  diverticulitis see results below -Completed 5-day course antibiotics  Persistent A. Fib -Not on anticoagulation at home (significant fall risk) -Amiodarone 100 mg daily -Aspirin 81 mg daily -Coreg 6.25 mg BID -Hydralazine 10 mg TID - Essential HTN -See persistent A. fib  Hx stroke in 2019  -RIGHT upper extremity weakness -Expressive aphasia   HLD -Lipitor 10 mg daily -10/16 LDL= 46  CKD stage IIIb (baseline Cr~1.5) Lab Results  Component Value Date   CREATININE 1.20 (H) 11/29/2019   CREATININE 1.25 (H) 11/28/2019   CREATININE 1.62 (H) 11/27/2019   CREATININE 1.71 (H) 11/26/2019   CREATININE 1.65 (H) 11/24/2019  -Better than baseline  Hypothyroidism -Synthroid 75 mcg daily  Abnormal CT abdomen finding -Large L adnexal cyst, thought to be benign given long term stability, follow outpatient  Hypokalemia -Potassium goal> 4  Hypomagnesmia -Magnesium goal> 2  Hypophosphatemia -Phosphorus goal> 2.5   Diarrhea iatrogenic  -Patient has been on multiple medications for constipation secondary to her pain medication for her multiple fractures now having moderate to severe constipation.  Have discontinued medication. -Spoke at length with son explained we will not give medication to stop the diarrhea because patient still taking pain medication for her multiple fractures which she requires, should stop on its own. -10/17 significantly improved after discontinuation of all laxatives.  Patient should be ready for discharge in the a.m. -10/18 spoke with RN no reported diarrhea overnight stable for discharge     Discharge Diagnoses:  Active Problems:   A-fib (HCC)   Chronic diastolic congestive heart failure (HCC)   Essential hypertension   Fall   DVT (deep venous thrombosis) (HCC)   Pulmonary embolus (HCC)   Persistent atrial fibrillation ([place])   Acute metabolic encephalopathy   Community  acquired pneumonia   E. coli UTI   Diverticulitis   HLD  (hyperlipidemia)   Diarrhea, unspecified   Discharge Condition: Stable  Diet recommendation: Regular  There were no vitals filed for this visit.  History of present illness:  Sara Dufault Sorgeis a 86 y.o.WF PMHx stroke in 2019 with residual right upper extremity weakness, expressive aphasia;HTN, HLD, persistent A. fib, DVT/PE, Diverticulosis, ulcerative colitis.  CKD stage IIIb   Patient is alert, awake and able to listen to conversation but has significant expressive aphasia. Her son at bedside helping with the history. Since her stroke in 2019, patient has been living in an assisted living facility. At baseline, she has expressive aphasia and weakness of right upper extremity but has no dysphagia and is able to ambulate with a walker. Inrespect to patient's wish, she is DNR/DNI and does not want any aggressive measures. 10/9, in the morning while trying to open the refrigerator, patient lost balance and fell on the floor. Staff attended immediately and did not notice any major injury. Her son visited in the afternoon and at that time, she complained of pain in the right shoulder and left hip. X-rays were done at the facility and were negative. Her pain got worse in the next 48 hours and hence sent to the ED.  She was seen at ED in [facility]. Multiple imagings were obtained.Significant findings being right scapula fracture,left pubic bone fracture and chronic compression fracture of L1 vertebrae.  ED physician discussed the case with orthopedics on-call Dr. Rolena Infante who recommended nonsurgical conservative management. Patient was accepted for admission last night but because of bed unavailability, patient arrived this afternoon only.  At the time of my evaluation, patient was alert, awake, comfortable. Son was at bedside and help all with the history.   Hospital Course:  See above  Procedures: 10/11 CXR;-right basilar airspace opacity with associated trace  pleural effusion suggestive of infection/inflammation.  -Followup PA and lateral chest X-ray is recommended in 3-4 weeks following trial of antibiotic therapy to ensure resolution and exclude underlying malignancy. -Large hiatal hernia.  10/11 CT pelvis W0 contrast;-fractures of the left superior and inferior pubic rami adjacent to the pubic symphysis -Large left adnexal cyst similar to that noted on a prior CT from2016. Given its long-term stability it is felt to be benign in etiology. -Changes suggestive of early diverticulitis in the distal descending colon   Consultations: Orthopedic surgery   Cultures  10/16 SARS coronavirus negative 10/16 influenza A/B negative   Antibiotics Anti-infectives (From admission, onward)   Start     Ordered Stop   11/22/19 2030  cefTRIAXone (ROCEPHIN) 2 g in sodium chloride 0.9 % 100 mL IVPB        11/22/19 2022 11/26/19 2245   11/22/19 2030  metroNIDAZOLE (FLAGYL) IVPB 500 mg        11/22/19 2022 11/26/19 2206       Discharge Exam: Vitals:   11/28/19 0616 11/28/19 1351 11/28/19 2130 11/29/19 0608  BP: (!) 167/74 (!) 105/57 (!) 156/68 (!) 177/66  Pulse: 67 62 68 61  Resp: 18  18 18   Temp: 98 F (36.7 C) 98.7 F (37.1 C) 98.8 F (37.1 C) 97.8 F (36.6 C)  TempSrc:  Oral Oral Oral  SpO2: 97% 98% 98% 97%  Height:        General: A/O x4, No acute respiratory distress Eyes: negative scleral hemorrhage, negative anisocoria, negative icterus ENT: Negative Runny nose, negative gingival bleeding, Neck:  Negative scars,  masses, torticollis, lymphadenopathy, JVD Lungs: Clear to auscultation bilaterally without wheezes or crackles Cardiovascular: Regular rate and rhythm without murmur gallop or rub normal S1 and S2   Discharge Instructions   Allergies as of 11/29/2019      Reactions   Iohexol Hives   Ivp Dye [iodinated Diagnostic Agents] Hives   Was able to tolerate Contrast Media for CT scan.   Scallops [shellfish Allergy] Nausea  And Vomiting   Projectile vomiting   Shellfish-derived Products Nausea And Vomiting   Sulfa Antibiotics Hives      Medication List    STOP taking these medications   aspirin 81 MG tablet Replaced by: aspirin 81 MG EC tablet   docusate sodium 100 MG capsule Commonly known as: COLACE   polyethylene glycol 17 g packet Commonly known as: MIRALAX / GLYCOLAX   potassium chloride 20 MEQ/15ML (10%) Soln   sodium chloride 1 g tablet     TAKE these medications   acetaminophen 325 MG tablet Commonly known as: TYLENOL Take 2 tablets (650 mg total) by mouth every 6 (six) hours. What changed:   medication strength  how much to take  when to take this   amiodarone 100 MG tablet Commonly known as: PACERONE Take 1 tablet (100 mg total) by mouth daily.   aspirin 81 MG EC tablet Take 1 tablet (81 mg total) by mouth daily. Swallow whole. Start taking on: November 30, 2019 Replaces: aspirin 81 MG tablet   atorvastatin 10 MG tablet Commonly known as: LIPITOR Take 10 mg by mouth daily.   CALTRATE GUMMY BITES PO Take 2 tablets by mouth 2 (two) times daily.   carvedilol 6.25 MG tablet Commonly known as: COREG Take 6.25 mg by mouth 2 (two) times daily with a meal. Hold for SBP <110 or HR <50   GAS RELIEF PO Take 1 tablet by mouth 2 (two) times daily.   Goniovisc 2.5 % ophthalmic solution Generic drug: hydroxypropyl methylcellulose / hypromellose Place 1 drop into both eyes as needed for dry eyes.   hydrALAZINE 10 MG tablet Commonly known as: APRESOLINE Take 1 tablet (10 mg total) by mouth every 8 (eight) hours.   Icy Hot Extra Strength 10-30 % Crea Apply 1 application topically 2 (two) times daily. Right shoulder   ipratropium-albuterol 0.5-2.5 (3) MG/3ML Soln Commonly known as: DUONEB Take 3 mLs by nebulization every 6 (six) hours as needed (SOB/Wheezing /cough).   lactose free nutrition Liqd Take 237 mLs by mouth daily.   levothyroxine 75 MCG tablet Commonly known  as: SYNTHROID Take 1 tablet (75 mcg total) by mouth daily before breakfast.   Melatonin 10 MG Tabs Take 10 mg by mouth at bedtime as needed (sleep). What changed:   medication strength  how much to take  when to take this  reasons to take this   ondansetron 4 MG tablet Commonly known as: ZOFRAN Take 4 mg by mouth every 6 (six) hours as needed for nausea or vomiting.   oxyCODONE 5 MG immediate release tablet Commonly known as: Oxy IR/ROXICODONE Take 0.5 tablets (2.5 mg total) by mouth every 4 (four) hours as needed for up to 5 days for moderate pain.   oxyCODONE 5 MG immediate release tablet Commonly known as: Oxy IR/ROXICODONE Take 1 tablet (5 mg total) by mouth every 4 (four) hours as needed for severe pain.   pantoprazole 40 MG tablet Commonly known as: PROTONIX Take 40 mg by mouth daily.   senna 8.6 MG Tabs tablet Commonly known as:  SENOKOT Take 1 tablet (8.6 mg total) by mouth daily as needed for mild constipation.   tiZANidine 2 MG tablet Commonly known as: ZANAFLEX Take 1 tablet (2 mg total) by mouth 4 (four) times daily -  with meals and at bedtime. What changed: when to take this   traMADol 50 MG tablet Commonly known as: ULTRAM Take 1 tablet (50 mg total) by mouth every 6 (six) hours as needed for moderate pain.   WITCH HAZEL EX Apply 1 application topically 2 (two) times daily as needed (for comfort). Witch hazel pads      Allergies  Allergen Reactions  . Iohexol Hives  . Ivp Dye [Iodinated Diagnostic Agents] Hives    Was able to tolerate Contrast Media for CT scan.  [name] [Shellfish Allergy] Nausea And Vomiting    Projectile vomiting  . Shellfish-Derived Products Nausea And Vomiting  . Sulfa Antibiotics Hives    Follow-up Information    Reed, Tiffany L, DO. Schedule an appointment as soon as possible for a visit in 4 week(s).   Specialty: Geriatric Medicine Why: Follow-up with DO Tiffany Reed in 3 to 4 weeks CXR evaluate RIGHT basilar CAP  vs neoplasm Contact information: [address]. [place] [address] [phone]                The results of significant diagnostics from this hospitalization (including imaging, microbiology, ancillary and laboratory) are listed below for reference.    Significant Diagnostic Studies: DG Pelvis 1-2 Views  Result Date: 11/22/2019 CLINICAL DATA:  Recent fall with pelvic pain, initial encounter EXAM: PELVIS - 1-2 VIEW COMPARISON:  08/03/2017 FINDINGS: Pelvic ring is intact. Fragmentation is noted along the superior pubic ramus on the left. No other definitive fracture is seen. No soft tissue abnormality is noted. IMPRESSION: Left superior pubic ramus fracture. No other focal abnormality is noted. Electronically Signed   By: Inez Catalina M.D.   On: 11/22/2019 17:15   DG Shoulder Right  Result Date: 11/22/2019 CLINICAL DATA:  Recent fall with right shoulder pain, initial encounter EXAM: RIGHT SHOULDER - 2+ VIEW COMPARISON:  None. FINDINGS: Degenerative changes of the acromioclavicular joint and glenohumeral joint are seen. The is high-riding articulating with the acromion consistent with chronic rotator cuff injury. Bony density is noted anterior to the humeral head on the Y-view which suggests a coracoid fracture. Additionally fragmentation in the scapular body is noted. IMPRESSION: Changes consistent with scapular fracture in the body as well as at the coracoid process. Cross-sectional imaging may be helpful for further evaluation. Degenerative changes are noted about the shoulder joint with findings of a chronic rotator cuff injury. Electronically Signed   By: Inez Catalina M.D.   On: 11/22/2019 17:00   DG Elbow Complete Right  Result Date: 11/22/2019 CLINICAL DATA:  Fall 2 days ago with elbow pain, initial encounter EXAM: RIGHT ELBOW - COMPLETE 3+ VIEW COMPARISON:  None. FINDINGS: No acute fracture or dislocation is noted. Small joint effusion is noted with elevation of the anterior  fat pad. Mild soft tissue swelling is noted posteriorly. IMPRESSION: Small joint effusion without acute fracture. Mild soft tissue swelling is noted posteriorly. Electronically Signed   By: Inez Catalina M.D.   On: 11/22/2019 17:05   DG Ankle Complete Right  Result Date: 11/22/2019 CLINICAL DATA:  Recent fall with right ankle pain, initial encounter EXAM: RIGHT ANKLE - COMPLETE 3+ VIEW COMPARISON:  None. FINDINGS: Generalized osteopenia is noted. No soft tissue abnormality is seen. No acute fracture  or dislocation is noted. Tarsal degenerative changes are seen. IMPRESSION: Chronic changes without acute abnormality. Electronically Signed   By: Inez Catalina M.D.   On: 11/22/2019 17:12   CT Head Wo Contrast  Result Date: 11/22/2019 CLINICAL DATA:  86 year old female status post fall 3 days ago with pain. EXAM: CT HEAD WITHOUT CONTRAST TECHNIQUE: Contiguous axial images were obtained from the base of the skull through the vertex without intravenous contrast. COMPARISON:  Brain MRI 08/03/2017.  Head CT 10/06/2017. FINDINGS: Brain: Cerebral volume is not significantly changed since 2019. Patchy and confluent bilateral white matter hypodensity appears increased. No midline shift, ventriculomegaly, mass effect, evidence of mass lesion, intracranial hemorrhage or evidence of cortically based acute infarction. Vascular: Calcified atherosclerosis at the skull base. No suspicious intracranial vascular hyperdensity. Skull: No acute osseous abnormality identified. Sinuses/Orbits: New low-density fluid or mucosal thickening in the right posterior ethmoid, sphenoid sinuses. Other visible sinuses, tympanic cavities and mastoids remain well pneumatized. Other: No acute orbit or scalp soft tissue finding. IMPRESSION: 1. No acute intracranial abnormality or recent traumatic injury identified. 2. Progressed chronic white matter disease since 2019. 3. New mild posterior ethmoid and sphenoid sinus inflammation. Electronically  Signed   By: Genevie Ann M.D.   On: 11/22/2019 19:55   CT Lumbar Spine Wo Contrast  Result Date: 11/22/2019 CLINICAL DATA:  Fall.  Severe back pain EXAM: CT LUMBAR SPINE WITHOUT CONTRAST TECHNIQUE: Multidetector CT imaging of the lumbar spine was performed without intravenous contrast administration. Multiplanar CT image reconstructions were also generated. COMPARISON:  Lumbar spine radiographs 02/23/2015 FINDINGS: Segmentation: Normal Alignment: Moderate levoscoliosis.  Normal sagittal alignment. Vertebrae: Severe compression fracture L1 vertebral body. This has progressed since the prior study in 2017 when there was a mild fracture of the superior endplate of L1. Mild bony retropulsion of the superior endplate of L1 into the canal causing mild spinal stenosis. No other fracture Paraspinal and other soft tissues: No significant paraspinous edema at the L1 fracture. Atherosclerotic aorta without aneurysm Cholelithiasis Large fluid collection in the left pelvis measuring 4.5 x 7 cm. This appears to be adnexal in location. Disc levels: Multilevel disc and facet degeneration throughout the lumbar spine. Mild spinal stenosis at L1 due to posterior retropulsion of superior endplate. No other areas of significant spinal stenosis. IMPRESSION: 1. Severe compression fracture of L1, with progression since 2017. This could be a superimposed acute fracture or chronic fracture. Mild spinal stenosis due to retropulsion into the canal. No other fracture. 2. Cholelithiasis 3. 4.5 x 7 mm fluid collection left pelvis likely from the adnexa. Because this lesion is not adequately characterized, prompt [place] is recommended for further evaluation. Note: This recommendation does not apply to premenarchal patients and to those with increased risk (genetic, family history, elevated tumor markers or other high-risk factors) of ovarian cancer. Reference: JACR 2020 Feb; 17(2):248-254 Electronically Signed   By: Franchot Gallo M.D.   On:  11/22/2019 16:17   CT PELVIS WO CONTRAST  Result Date: 11/22/2019 CLINICAL DATA:  Fall several days ago with pelvic pain, initial encounter EXAM: CT PELVIS WITHOUT CONTRAST TECHNIQUE: Multidetector CT imaging of the pelvis was performed following the standard protocol without intravenous contrast. COMPARISON:  CT of the right femur from earlier in the same day, CT from 09/18/2014. FINDINGS: Urinary Tract:  Bladder is partially distended. Bowel: Scattered feet fecal material is noted throughout the colon. Mild diverticular changes noted. Some very mild inflammatory changes are noted surrounding the distal descending colon which may represent some very  mild diverticulitis. Vascular/Lymphatic: Atherosclerotic calcifications are noted without aneurysmal dilatation. No sizable lymphadenopathy is noted. Reproductive: Uterus is within normal limits. Scattered fluid attenuation lesions are noted within the adnexa. The largest of these lies on the left measuring approximately 6.8 cm. This is similar to that seen on prior CT from 2016 and consistent with a benign etiology. Other:  None. Musculoskeletal: Degenerative changes of lumbar spine are noted. The sacrum appears within normal limits. Old healed fractures of the superior and inferior pubic rami on the right are noted. There are changes of acute fracture adjacent to the pubic symphysis in both the inferior and superior pubic rami. This corresponds with that seen on prior CT as well as the recent plain film evaluation. Some adjacent stranding is noted consistent with localized edema/hemorrhage related to the injury. No focal confluent hematoma is seen. IMPRESSION: Fractures of the left superior and inferior pubic rami adjacent to the pubic symphysis as described above similar to that seen on prior CT of the femur. Large left adnexal cyst similar to that noted on a prior CT from 2016. Given its long-term stability it is felt to be benign in etiology. Changes  suggestive of early diverticulitis in the distal descending colon. Electronically Signed   By: Inez Catalina M.D.   On: 11/22/2019 19:58   CT FEMUR RIGHT WO CONTRAST  Result Date: 11/22/2019 CLINICAL DATA:  Severe right thigh pain after fall on Saturday. EXAM: CT OF THE LOWER RIGHT EXTREMITY WITHOUT CONTRAST TECHNIQUE: Multidetector CT imaging of the right lower extremity was performed according to the standard protocol. COMPARISON:  Right femur x-rays dated August 03, 2017. FINDINGS: Bones/Joint/Cartilage Partially visualized acute parasymphyseal fractures of the left superior and inferior pubic rami. Old healed fractures of the right superior and inferior pubic rami. No dislocation. Serpiginous subchondral sclerosis in the medial tibial plateau likely represents a a bone infarct. Mild tricompartmental degenerative changes of the knee. The right hip joint space is preserved. Osteopenia. No joint effusion. Ligaments Ligaments are suboptimally evaluated by CT. Muscles and Tendons Grossly intact. Atrophy of the semimembranosus and biceps femoris muscles. Soft tissue Small amount of ill-defined high-density material in the superficial fat of the right hip along the inferior aspect of the greater trochanter, likely hemorrhage. No soft tissue mass. 6.9 x 5.2 cm simple appearing cystic lesion arising from the left ovary. IMPRESSION: 1. Partially visualized acute parasymphyseal fractures of the left superior and inferior pubic rami. Old healed fractures of the right superior and inferior pubic rami. 2. Small ill-defined hematoma in the superficial fat of the right hip along the inferior aspect of the greater trochanter. No underlying fracture. 3. 6.9 cm cystic lesion arising from the left ovary. Because this lesion is not adequately characterized, prompt [place] is recommended for further evaluation. Note: This recommendation does not apply to premenarchal patients and to those with increased risk (genetic, family history,  elevated tumor markers or other high-risk factors) of ovarian cancer. Reference: JACR 2020 Feb; 17(2):248-254 Electronically Signed   By: Titus Dubin M.D.   On: 11/22/2019 16:34   DG Chest Port 1 View  Result Date: 11/22/2019 CLINICAL DATA:  Admission for pneumonia, known fractures of the shoulder and pelvis. EXAM: PORTABLE CHEST 1 VIEW COMPARISON:  Chest x-ray 02/11/2018, CT chest 01/31/2017 FINDINGS: Wireless cardiac device overlies the left chest. The heart size and mediastinal contours are unchanged. Redemonstration of a large lucency overlying the mediastinum consistent with known hiatal hernia. Aortic arch calcification. Right base airspace opacity as well as  right trace pleural effusion. No definite consolidation within the left lung with persistent passive atelectasis of the lingula. No pulmonary edema. No left pleural effusion. No pneumothorax. No acute displaced fracture identified. Per history, there is a known shoulder fracture does not visualized on this study and may be collimated off view. IMPRESSION: 1. Right basilar airspace opacity with associated trace pleural effusion suggestive of infection/inflammation. Followup PA and lateral chest X-ray is recommended in 3-4 weeks following trial of antibiotic therapy to ensure resolution and exclude underlying malignancy. 2. Large hiatal hernia. 3.  Aortic Atherosclerosis (ICD10-I70.0). Electronically Signed   By: Iven Finn M.D.   On: 11/22/2019 20:48   DG Knee Complete 4 Views Right  Result Date: 11/22/2019 CLINICAL DATA:  Recent fall with right knee pain, initial encounter EXAM: RIGHT KNEE - COMPLETE 4+ VIEW COMPARISON:  08/03/2017 FINDINGS: No acute fracture or dislocation is noted. Calcification in the medial tibial plateau is noted without acute abnormality. This is stable from the prior exam. No acute abnormality noted. IMPRESSION: No acute abnormality noted. Electronically Signed   By: Inez Catalina M.D.   On: 11/22/2019 17:09    CUP PACEART REMOTE DEVICE CHECK  Result Date: 11/15/2019 ILR summary report received. Battery status OK. Normal device function. No new symptom episodes, tachy episodes, brady, or pause episodes. No new AF episodes. Monthly summary reports and ROV/PRN JM   Microbiology: Recent Results (from the past 240 hour(s))  Respiratory Panel by RT PCR (Flu A&B, Covid) - Nasopharyngeal Swab     Status: None   Collection Time: 11/22/19  6:45 PM   Specimen: Nasopharyngeal Swab  Result Value Ref Range Status   SARS Coronavirus 2 by RT PCR NEGATIVE NEGATIVE Final    Comment: (NOTE) SARS-CoV-2 target nucleic acids are NOT DETECTED.  The SARS-CoV-2 RNA is generally detectable in upper respiratoy specimens during the acute phase of infection. The lowest concentration of SARS-CoV-2 viral copies this assay can detect is 131 copies/mL. A negative result does not preclude SARS-Cov-2 infection and should not be used as the sole basis for treatment or other patient management decisions. A negative result may occur with  improper specimen collection/handling, submission of specimen other than nasopharyngeal swab, presence of viral mutation(s) within the areas targeted by this assay, and inadequate number of viral copies (<131 copies/mL). A negative result must be combined with clinical observations, patient history, and epidemiological information. The expected result is Negative.  Fact Sheet for Patients:  PinkCheek.be  Fact Sheet for Healthcare Providers:  GravelBags.it  This test is no t yet approved or cleared by the [place] FDA and  has been authorized for detection and/or diagnosis of SARS-CoV-2 by FDA under an Emergency Use Authorization (EUA). This EUA will remain  in effect (meaning this test can be used) for the duration of the COVID-19 declaration under Section 564(b)(1) of the Act, 21 U.S.C. section 360bbb-3(b)(1), unless the  authorization is terminated or revoked sooner.     Influenza A by PCR NEGATIVE NEGATIVE Final   Influenza B by PCR NEGATIVE NEGATIVE Final    Comment: (NOTE) The Xpert Xpress SARS-CoV-2/FLU/RSV assay is intended as an aid in  the diagnosis of influenza from Nasopharyngeal swab specimens and  should not be used as a sole basis for treatment. Nasal washings and  aspirates are unacceptable for Xpert Xpress SARS-CoV-2/FLU/RSV  testing.  Fact Sheet for Patients: PinkCheek.be  Fact Sheet for Healthcare Providers: GravelBags.it  This test is not yet approved or cleared by the [place]  FDA and  has been authorized for detection and/or diagnosis of SARS-CoV-2 by  FDA under an Emergency Use Authorization (EUA). This EUA will remain  in effect (meaning this test can be used) for the duration of the  Covid-19 declaration under Section 564(b)(1) of the Act, 21  U.S.C. section 360bbb-3(b)(1), unless the authorization is  terminated or revoked. Performed at [facility], [address] [place], [place] [address]   Culture, blood (routine x 2)     Status: None   Collection Time: 11/22/19  8:53 PM   Specimen: BLOOD  Result Value Ref Range Status   Specimen Description   Final    BLOOD BLOOD LEFT FOREARM Performed at [facility], [address] [place], [place] [address]    Special Requests   Final    BOTTLES DRAWN AEROBIC AND ANAEROBIC Blood Culture adequate volume Performed at [facility], [address] [place], [place] [address]    Culture   Final    NO GROWTH 5 DAYS Performed at [facility], [place] [address], [place], [place] [address]    Report Status 11/27/2019 FINAL  Final  Culture, blood (routine x 2)     Status: None   Collection Time: 11/22/19  8:56 PM   Specimen: BLOOD  Result Value Ref Range Status   Specimen Description   Final    BLOOD BLOOD RIGHT FOREARM Performed  at [facility], [address] [place], [place] [address]    Special Requests   Final    BOTTLES DRAWN AEROBIC AND ANAEROBIC Blood Culture adequate volume Performed at [facility], [address] [place], [place] [address]    Culture   Final    NO GROWTH 5 DAYS Performed at [facility], [place] [address] [place], [place] [address]    Report Status 11/27/2019 FINAL  Final  Culture, Urine     Status: Abnormal   Collection Time: 11/22/19  9:10 PM   Specimen: Urine, Random  Result Value Ref Range Status   Specimen Description   Final    URINE, RANDOM Performed at [facility], [place], [place] [address]    Special Requests   Final    NONE Performed at [facility], [address] [place], [place] [address]    Culture >=100,000 COLONIES/mL ESCHERICHIA COLI (A)  Final   Report Status 11/24/2019 FINAL  Final   Organism ID, Bacteria ESCHERICHIA COLI (A)  Final      Susceptibility   Escherichia coli - MIC*    AMPICILLIN >=32 RESISTANT Resistant     CEFAZOLIN <=4 SENSITIVE Sensitive     CEFTRIAXONE <=0.25 SENSITIVE Sensitive     CIPROFLOXACIN 0.5 SENSITIVE Sensitive     GENTAMICIN <=1 SENSITIVE Sensitive     IMIPENEM <=0.25 SENSITIVE Sensitive     NITROFURANTOIN <=16 SENSITIVE Sensitive     TRIMETH/SULFA >=320 RESISTANT Resistant     AMPICILLIN/SULBACTAM 16 INTERMEDIATE Intermediate     PIP/TAZO <=4 SENSITIVE Sensitive     * >=100,000 COLONIES/mL ESCHERICHIA COLI  Respiratory Panel by RT PCR (Flu A&B, Covid) - Nasopharyngeal Swab     Status: None   Collection Time: 11/27/19 12:23 PM   Specimen: Nasopharyngeal Swab  Result Value Ref Range Status   SARS Coronavirus 2 by RT PCR NEGATIVE NEGATIVE Final    Comment: (NOTE) SARS-CoV-2 target nucleic acids are NOT  DETECTED.  The SARS-CoV-2 RNA is generally detectable in upper respiratoy specimens during the acute phase of infection. The lowest concentration of  SARS-CoV-2 viral copies this assay can detect is 131 copies/mL. A negative result does not preclude SARS-Cov-2 infection and should not be used as the sole basis for treatment or other patient management decisions. A negative result may occur with  improper specimen collection/handling, submission of specimen other than nasopharyngeal swab, presence of viral mutation(s) within the areas targeted by this assay, and inadequate number of viral copies (<131 copies/mL). A negative result must be combined with clinical observations, patient history, and epidemiological information. The expected result is Negative.  Fact Sheet for Patients:  PinkCheek.be  Fact Sheet for Healthcare Providers:  GravelBags.it  This test is no t yet approved or cleared by the [place] FDA and  has been authorized for detection and/or diagnosis of SARS-CoV-2 by FDA under an Emergency Use Authorization (EUA). This EUA will remain  in effect (meaning this test can be used) for the duration of the COVID-19 declaration under Section 564(b)(1) of the Act, 21 U.S.C. section 360bbb-3(b)(1), unless the authorization is terminated or revoked sooner.     Influenza A by PCR NEGATIVE NEGATIVE Final   Influenza B by PCR NEGATIVE NEGATIVE Final    Comment: (NOTE) The Xpert Xpress SARS-CoV-2/FLU/RSV assay is intended as an aid in  the diagnosis of influenza from Nasopharyngeal swab specimens and  should not be used as a sole basis for treatment. Nasal washings and  aspirates are unacceptable for Xpert Xpress SARS-CoV-2/FLU/RSV  testing.  Fact Sheet for Patients: PinkCheek.be  Fact Sheet for Healthcare Providers: GravelBags.it  This test is not yet approved or cleared by the [place] FDA and  has been authorized for detection and/or diagnosis of SARS-CoV-2 by  FDA under an Emergency Use  Authorization (EUA). This EUA will remain  in effect (meaning this test can be used) for the duration of the  Covid-19 declaration under Section 564(b)(1) of the Act, 21  U.S.C. section 360bbb-3(b)(1), unless the authorization is  terminated or revoked. Performed at [facility], [place] [address] [place], [place] [address]      Labs: Basic Metabolic Panel: Recent Labs  Lab 11/24/19 0559 11/25/19 0943 11/26/19 0404 11/27/19 0553 11/28/19 0504 11/29/19 0441  NA 134*  --  136 139 140 138  K 4.0  --  3.8 3.4* 4.1 3.8  CL 104  --  106 109 114* 108  CO2 20*  --  21* 19* 18* 17*  GLUCOSE 95  --  100* 104* 93 99  BUN 40*  --  54* 49* 29* 27*  CREATININE 1.65*  --  1.71* 1.62* 1.25* 1.20*  CALCIUM 7.8*  --  7.9* 7.8* 8.0* 7.9*  MG  --  2.1 2.0 2.0 1.8 2.1  PHOS  --  2.4* 2.6 2.2* 1.7* 2.9   Liver Function Tests: Recent Labs  Lab 11/22/19 1844 11/26/19 0404 11/27/19 0553 11/28/19 0504 11/29/19 0441  AST 21 20 16  14* 16  ALT 15 12 10 10 11   ALKPHOS 117 97 103 95 97  BILITOT 0.3 0.4 0.4 0.5 0.4  PROT 6.9 5.6* 5.8* 5.4* 5.4*  ALBUMIN 3.1* 2.6* 2.6* 2.4* 2.3*   No results for input(s): LIPASE, AMYLASE in the last 168 hours. No results for input(s): AMMONIA in the last 168 hours. CBC: Recent Labs  Lab 11/25/19 0943 11/26/19 0404 11/27/19 0553 11/28/19 0504 11/29/19 0441  WBC 10.8* 6.6 10.2 7.1  7.3  NEUTROABS 9.1* 5.1 8.2* 5.7 5.6  HGB 10.5* 8.7* 9.0* 8.5* 9.2*  HCT 32.8* 26.8* 28.2* 26.9* 28.4*  MCV 92.7 91.5 92.5 93.1 92.8  PLT 249 223 217 206 257   Cardiac Enzymes: Recent Labs  Lab 11/22/19 1844  CKTOTAL 32*   BNP: BNP (last 3 results) No results for input(s): BNP in the last 8760 hours.  ProBNP (last 3 results) No results for input(s): PROBNP in the last 8760 hours.  CBG: No results for input(s): GLUCAP in the last 168 hours.     Signed:  Dia Crawford, MD [facility] [phone] pager

## 2019-11-29 NOTE — Progress Notes (Signed)
Pt unable to give pain number but faces scale was a 10  When turning in bed. Ordered pain medicine given.  Pt note to cough after swallowing. Will try no straws in addition to sitting up 90 degrees and doing 1 pill at a time.

## 2019-11-30 ENCOUNTER — Non-Acute Institutional Stay (SKILLED_NURSING_FACILITY): Payer: Medicare Other | Admitting: Internal Medicine

## 2019-11-30 DIAGNOSIS — R278 Other lack of coordination: Secondary | ICD-10-CM | POA: Diagnosis not present

## 2019-11-30 DIAGNOSIS — S42101A Fracture of unspecified part of scapula, right shoulder, initial encounter for closed fracture: Secondary | ICD-10-CM | POA: Diagnosis not present

## 2019-11-30 DIAGNOSIS — S3282XS Multiple fractures of pelvis without disruption of pelvic ring, sequela: Secondary | ICD-10-CM | POA: Diagnosis not present

## 2019-11-30 DIAGNOSIS — I4819 Other persistent atrial fibrillation: Secondary | ICD-10-CM | POA: Diagnosis not present

## 2019-11-30 DIAGNOSIS — J189 Pneumonia, unspecified organism: Secondary | ICD-10-CM

## 2019-11-30 DIAGNOSIS — S32010S Wedge compression fracture of first lumbar vertebra, sequela: Secondary | ICD-10-CM

## 2019-11-30 DIAGNOSIS — M84350A Stress fracture, pelvis, initial encounter for fracture: Secondary | ICD-10-CM | POA: Diagnosis not present

## 2019-11-30 DIAGNOSIS — R2689 Other abnormalities of gait and mobility: Secondary | ICD-10-CM | POA: Diagnosis not present

## 2019-11-30 DIAGNOSIS — I69151 Hemiplegia and hemiparesis following nontraumatic intracerebral hemorrhage affecting right dominant side: Secondary | ICD-10-CM

## 2019-11-30 DIAGNOSIS — S42191S Fracture of other part of scapula, right shoulder, sequela: Secondary | ICD-10-CM | POA: Diagnosis not present

## 2019-11-30 DIAGNOSIS — K52839 Microscopic colitis, unspecified: Secondary | ICD-10-CM | POA: Diagnosis not present

## 2019-11-30 DIAGNOSIS — W19XXXS Unspecified fall, sequela: Secondary | ICD-10-CM | POA: Diagnosis not present

## 2019-11-30 DIAGNOSIS — N83202 Unspecified ovarian cyst, left side: Secondary | ICD-10-CM

## 2019-11-30 DIAGNOSIS — R4701 Aphasia: Secondary | ICD-10-CM

## 2019-11-30 DIAGNOSIS — G9341 Metabolic encephalopathy: Secondary | ICD-10-CM | POA: Diagnosis not present

## 2019-11-30 DIAGNOSIS — M6389 Disorders of muscle in diseases classified elsewhere, multiple sites: Secondary | ICD-10-CM | POA: Diagnosis not present

## 2019-11-30 NOTE — Progress Notes (Signed)
Provider:  Rexene Edison. Mariea Clonts, D.O., C.M.D. Location:  Occupational psychologist of Service:  SNF (31)  PCP: [name], DO Patient Care Team: [name], DO as PCP - General (Geriatric Medicine) Benard Rink (Dentistry) [place], Heather, [place] (Optometry) Sydnee Levans, MD (Dermatology) Thompson Grayer, MD as Consulting Physician (Cardiology) [name], DO as Consulting Physician (Geriatric Medicine)  Extended Emergency Contact Information Primary Emergency Contact: Boxx,Jim Address: [address]          [place], [place] [address] Johnnette Litter of [place] Phone: [phone] Mobile Phone: [phone] Relation: Son Secondary Emergency Contact: [name] [place] of [place] Phone: [phone] Mobile Phone: [phone] Relation: Son  Code Status: DNR, MOST Goals of Care: Advanced Directive information Advanced Directives 11/22/2019  Does Patient Have a Medical Advance Directive? Yes  Type of Advance Directive Out of facility DNR (pink MOST or yellow form)  Does patient want to make changes to medical advance directive? No - Patient declined  Copy of [place] in Chart? -  Would patient like information on creating a medical advance directive? -  Pre-existing out of facility DNR order (yellow form or pink MOST form) -   Chief Complaint  Patient presents with  . New Admit To SNF    rehab admission s/p hospitlaization with c=right scapular fracture, pelvic fx    HPI: Patient is a 86 y.o. female with h/o prior stroke with expressive aphasia, RUE weakness, htn, hyperlipidemia, afib, DVT/PE, diverticulosis and ulcerative colitis seen today for admission to [facility] for rehab s/p fall on 10/9 in [facility].  She had baseline chronic pain in the right shoulder, as well, and has had extensive OT and therapeutic modalities for it.  Unfortunately, she fell 10/9 in the am while trying to open her  refrigerator.  At that point, she did not have major injury.  She did c/o right shoulder and left hip pain on 10/11 and NP saw her as pain got worse.  Portable xrays had been negative, but when seen in the [facility], she was found to have a right scapular fx, left pubic bone fx and chronic L1 compression fx.  Dr. Rolena Infante recommended conservative mgt.  Her right arm was to be in a sling and percocet and zanaflex given for pain with PT, OT eval.  Unfortunately, her stay was complicated by delirium with her pain, pain meds, and change in environment.  She is WBAT for her pelvic fxs and here to receive pT, OT.  She's been getting tylenol, tramadol and oxycodone when needed for pain.  No ortho f/u recommended.    There was question of CAP but she did not have left shift, leukocytosis, but had a right base abnormality.  Repeat xray in 3-4 wks recommended.  She was treated with 5 days of abx.  There is some concern that it could be malignancy.  She had abx for an E coli UTI and there as question if she had early diverticulitis, too.    She had a large left adnexal cyst felt to be benign.  She is not on anticoagulation b/c of prior small hemorrhage when she had her stroke not just fall risk.    She had iatrogenic diarrhea at the hospital and it improved when laxatives were held.  She may need standing orders here to get back on track.  She's not eating or taking her boost.  She's not wanting to watch tv even  law and order which is her favorite.  She only had chocolate milk today.  She's been combative with turning and positioning, she refused her pills earlier but did eventually take them whole.  Past Medical History:  Diagnosis Date  . Abdominal mass   . AKI (acute kidney injury) ([place]) 09/15/2014  . Atrial fibrillation ([place])   . Colitis, ulcerative ([place])   . Cough    WHITE SPUTUM FOR 2 WEEKS, NO FEVER  . Degenerative joint disease   . Diverticulosis   . DVT (deep venous thrombosis)  ([place]) 2009   BOTH LUNGS  . GERD (gastroesophageal reflux disease)   . Hyperlipidemia   . Hyperplastic colon polyp   . Hypertension   . Hypothyroidism   . Iron deficiency anemia    ON IRON AT TIMES  . PE (pulmonary embolism) 2009  . Persistent atrial fibrillation ([place])   . Positive H. pylori test 01/20/96  . S/P dilatation of esophageal stricture 1992  . Skin cancer    h/o basal and squamous skin cancer--> removed  . Stroke [facility])    Past Surgical History:  Procedure Laterality Date  . APPENDECTOMY    . BREAST EXCISIONAL BIOPSY Left 1992   benign  . BREAST LUMPECTOMY     benign  . DILATION AND CURETTAGE OF UTERUS     for endometrial polyps  . ENDOSCOPIC RETROGRADE CHOLANGIOPANCREATOGRAPHY (ERCP) WITH PROPOFOL N/A 03/23/2015   Procedure: ENDOSCOPIC RETROGRADE CHOLANGIOPANCREATOGRAPHY (ERCP) WITH PROPOFOL;  Surgeon: Milus Banister, MD;  Location: [facility];  Service: Endoscopy;  Laterality: N/A;  . JOINT REPLACEMENT    . LOOP RECORDER INSERTION N/A 10/14/2017   Procedure: LOOP RECORDER INSERTION;  Surgeon: Thompson Grayer, MD;  Location: [facility];  Service: Cardiovascular;  Laterality: N/A;  . ORIF ANKLE FRACTURE Left    X 2  . REPLACEMENT TOTAL KNEE     left  . SURGERY DONE TO REMOVE LEFT ANKLE HARDWARE LATER    . TONSILLECTOMY      Social History   Socioeconomic History  . Marital status: Widowed    Spouse name: Not on file  . Number of children: 3  . Years of education: college  . Highest education level: Bachelor's degree (e.g., BA, AB, BS)  Occupational History  . Occupation: retired Optician, dispensing: RETIRED  Tobacco Use  . Smoking status: Former Smoker    Packs/day: 0.50    Years: 13.00    Pack years: 6.50    Types: Cigarettes    Start date: 02/12/1948    Quit date: 02/11/1961    Years since quitting: 58.8  . Smokeless tobacco: Never Used  . Tobacco comment: Denies use of tobacco since 1963  Vaping Use  . Vaping Use: Never used  Substance and  Sexual Activity  . Alcohol use: Yes    Alcohol/week: 5.0 standard drinks    Types: 5 Glasses of wine per week    Comment: WINE FEW TIMES PER WEEK  . Drug use: No  . Sexual activity: Never  Other Topics Concern  . Not on file  Social History Narrative   Health Care POA:    Emergency Contact: son, Parrie Rasco (c) [phone] [name])   Diet: Pt has a varied diet of protein, starch, vegetables.   Exercise: Pt has no regular exercise plan because of pain in back.   Seatbelts: Pt reports wearing seatbelt when in vehicle.   Nancy Fetter Exposure/Protection: Pt reports wearing sun screen.   Hobbies: reading, traveling, eating  out.      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 [place], RN, BSN                                                                                                    Social Determinants of Health   Financial Resource Strain:   . Difficulty of Paying Living Expenses: Not on file  Food Insecurity:   . Worried About Charity fundraiser in the Last Year: Not on file  . Ran Out of Food in the Last Year: Not on file    Transportation Needs:   . Lack of Transportation (Medical): Not on file  . Lack of Transportation (Non-Medical): Not on file  Physical Activity:   . Days of Exercise per Week: Not on file  . Minutes of Exercise per Session: Not on file  Stress:   . Feeling of Stress : Not on file  Social Connections:   . Frequency of Communication with Friends and Family: Not on file  . Frequency of Social Gatherings with Friends and Family: Not on file  . Attends Religious Services: Not on file  . Active Member of Clubs or Organizations: Not on file  . Attends Archivist Meetings: Not on file  . Marital Status: Not on file    reports that she quit smoking about 58 years ago. Her smoking use included cigarettes. She started smoking about 71 years ago. She has a 6.50 pack-year smoking history. She has never used smokeless tobacco. She reports current alcohol use of about 5.0 standard drinks of alcohol per week. She reports that she does not use drugs.  Functional Status Survey:    Family History  Problem Relation Age of Onset  . Esophageal cancer Brother   . Diverticulitis Brother   . Heart disease Mother   . Stroke Mother   . Dementia Mother   . Heart attack Mother   . Heart disease Father   . Atrial fibrillation Father   . Breast cancer Maternal Aunt   . Colon cancer Neg Hx     Health Maintenance  Topic Date Due  . TETANUS/TDAP  04/14/2018  . INFLUENZA VACCINE  09/12/2019  . DEXA SCAN  Completed  . COVID-19 Vaccine  Completed  . PNA vac Low Risk Adult  Completed    Allergies  Allergen Reactions  . Iohexol Hives  . Ivp Dye [Iodinated Diagnostic Agents] Hives    Was able to tolerate Contrast Media for CT scan.  [name] [Shellfish Allergy] Nausea And Vomiting    Projectile vomiting  . Shellfish-Derived Products Nausea And Vomiting  . Sulfa Antibiotics Hives    Outpatient Encounter Medications as of 11/30/2019  Medication Sig  . acetaminophen (TYLENOL) 325 MG  tablet Take 2 tablets (650 mg total) by mouth every 6 (six) hours.  Marland Kitchen amiodarone (PACERONE) 100 MG tablet Take 1 tablet (100 mg total) by mouth daily.  Marland Kitchen aspirin EC 81 MG EC tablet Take 1 tablet (81 mg total) by mouth daily. Swallow whole.  Marland Kitchen atorvastatin (LIPITOR) 10 MG tablet Take 10 mg by mouth daily.  . Ca Phosphate-Cholecalciferol (CALTRATE GUMMY BITES PO) Take 2 tablets by mouth 2 (two) times daily.   . carvedilol (COREG) 6.25 MG tablet Take 6.25 mg by mouth 2 (two) times daily with a meal. Hold for SBP <110 or HR <50  . hydrALAZINE (APRESOLINE) 10 MG tablet Take 1 tablet (10 mg total) by mouth every 8 (eight) hours.  . hydroxypropyl methylcellulose / hypromellose (GONIOVISC) 2.5 % ophthalmic solution Place 1 drop into both eyes as needed for dry eyes.  Marland Kitchen ipratropium-albuterol (DUONEB) 0.5-2.5 (3) MG/3ML SOLN Take 3 mLs by nebulization every 6 (six) hours as needed (SOB/Wheezing /cough).   . lactose free nutrition (BOOST PLUS) LIQD Take 237 mLs by mouth daily.  Marland Kitchen levothyroxine (SYNTHROID) 75 MCG tablet Take 1 tablet (75 mcg total) by mouth daily before breakfast.  . melatonin 10 MG TABS Take 10 mg by mouth at bedtime as needed (sleep).  . Menthol-Methyl Salicylate (ICY HOT EXTRA STRENGTH) 10-30 % CREA Apply 1 application topically 2 (two) times daily. Right shoulder  . ondansetron (ZOFRAN) 4 MG tablet Take 4 mg by mouth every 6 (six) hours as needed for nausea or vomiting.  Marland Kitchen oxyCODONE (OXY IR/ROXICODONE) 5 MG immediate release tablet Take 0.5 tablets (2.5 mg total) by mouth every 4 (four) hours as needed for up to 5 days for moderate pain.  Marland Kitchen oxyCODONE (OXY IR/ROXICODONE) 5 MG immediate release tablet Take 1 tablet (5 mg total) by mouth every 4 (four) hours as needed for severe pain.  . pantoprazole (PROTONIX) 40 MG tablet Take 40 mg by mouth daily.  Marland Kitchen senna (SENOKOT) 8.6 MG TABS tablet Take 1 tablet (8.6 mg total) by mouth daily as needed for mild constipation.  . Simethicone (GAS RELIEF  PO) Take 1 tablet by mouth 2 (two) times daily.  Marland Kitchen tiZANidine (ZANAFLEX) 2 MG tablet Take 1 tablet (2 mg total) by mouth 4 (four) times daily -  with meals and at bedtime.  . traMADol (ULTRAM) 50 MG tablet Take 1 tablet (50 mg total) by mouth every 6 (six) hours as needed for moderate pain.  . WITCH HAZEL EX Apply 1 application topically 2 (two) times daily as needed (for comfort). Witch hazel pads   No facility-administered encounter medications on file as of 11/30/2019.    Review of Systems  Unable to perform ROS: Mental status change (delirium, baseline aphasia, see HPI)    Vitals:   11/30/19 1152  BP: 111/72  Pulse: 64  Resp: 14  Temp: 97.7 F (36.5 C)  SpO2: 96%  Weight:  129 lb (58.5 kg)   Body mass index is 24.37 kg/m. Physical Exam Vitals reviewed.  Constitutional:      General: She is not in acute distress.    Comments: Finally resting after yelling out before visit  HENT:     Head: Normocephalic and atraumatic.     Right Ear: External ear normal.     Left Ear: External ear normal.     Nose: Nose normal.     Mouth/Throat:     Pharynx: Oropharynx is clear.  Eyes:     Conjunctiva/sclera: Conjunctivae normal.  Cardiovascular:     Rate and Rhythm: Rhythm irregular.     Heart sounds: No murmur heard.   Pulmonary:     Effort: Pulmonary effort is normal.     Breath sounds: Normal breath sounds. No wheezing, rhonchi or rales.  Abdominal:     General: Bowel sounds are normal. There is no distension.     Palpations: Abdomen is soft.     Tenderness: There is no abdominal tenderness. There is no guarding or rebound.  Musculoskeletal:     Right lower leg: No edema.     Left lower leg: No edema.     Comments: Loss of ROM right shoulder altogether now secondary to fx and pain; was already weak and had rotator cuff injury   Skin:    General: Skin is warm and dry.     Coloration: Skin is pale.     Findings: Bruising present.  Neurological:     Motor: Weakness present.      Gait: Gait abnormal.     Comments: Right shoulder tenderness, to be in sling but not sent with one and waiting to receive; aphasic, confused in different environment  Psychiatric:     Comments: Has been agitated and wanting to be left alone     Labs reviewed: Basic Metabolic Panel: Recent Labs    11/27/19 0553 11/28/19 0504 11/29/19 0441  NA 139 140 138  K 3.4* 4.1 3.8  CL 109 114* 108  CO2 19* 18* 17*  GLUCOSE 104* 93 99  BUN 49* 29* 27*  CREATININE 1.62* 1.25* 1.20*  CALCIUM 7.8* 8.0* 7.9*  MG 2.0 1.8 2.1  PHOS 2.2* 1.7* 2.9   Liver Function Tests: Recent Labs    11/27/19 0553 11/28/19 0504 11/29/19 0441  AST 16 14* 16  ALT 10 10 11   ALKPHOS 103 95 97  BILITOT 0.4 0.5 0.4  PROT 5.8* 5.4* 5.4*  ALBUMIN 2.6* 2.4* 2.3*   No results for input(s): LIPASE, AMYLASE in the last 8760 hours. No results for input(s): AMMONIA in the last 8760 hours. CBC: Recent Labs    11/27/19 0553 11/28/19 0504 11/29/19 0441  WBC 10.2 7.1 7.3  NEUTROABS 8.2* 5.7 5.6  HGB 9.0* 8.5* 9.2*  HCT 28.2* 26.9* 28.4*  MCV 92.5 93.1 92.8  PLT 217 206 257   Cardiac Enzymes: Recent Labs    11/22/19 1844  CKTOTAL 32*   BNP: Invalid input(s): POCBNP Lab Results  Component Value Date   HGBA1C 5.3 08/02/2013   Lab Results  Component Value Date   TSH 0.94 07/13/2019   Lab Results  Component Value Date   GNFAOZHY86 578 07/06/2009   Lab Results  Component Value Date   FOLATE  07/06/2009    >20.0 (NOTE)  Reference Ranges        Deficient:       0.4 - 3.3 ng/mL        Indeterminate:  3.4 - 5.4 ng/mL        Normal:              > 5.4 ng/mL   Lab Results  Component Value Date   IRON 42 09/22/2009   TIBC 281 09/22/2009   FERRITIN 49 06/26/2012    Imaging and Procedures obtained prior to SNF admission: DG Pelvis 1-2 Views  Result Date: 11/22/2019 CLINICAL DATA:  Recent fall with pelvic pain, initial encounter EXAM: PELVIS - 1-2 VIEW COMPARISON:  08/03/2017 FINDINGS:  Pelvic ring is intact. Fragmentation is noted along the superior pubic ramus on the left. No other definitive fracture is seen. No soft tissue abnormality is noted. IMPRESSION: Left superior pubic ramus fracture. No other focal abnormality is noted. Electronically Signed   By: Inez Catalina M.D.   On: 11/22/2019 17:15   DG Shoulder Right  Result Date: 11/22/2019 CLINICAL DATA:  Recent fall with right shoulder pain, initial encounter EXAM: RIGHT SHOULDER - 2+ VIEW COMPARISON:  None. FINDINGS: Degenerative changes of the acromioclavicular joint and glenohumeral joint are seen. The is high-riding articulating with the acromion consistent with chronic rotator cuff injury. Bony density is noted anterior to the humeral head on the Y-view which suggests a coracoid fracture. Additionally fragmentation in the scapular body is noted. IMPRESSION: Changes consistent with scapular fracture in the body as well as at the coracoid process. Cross-sectional imaging may be helpful for further evaluation. Degenerative changes are noted about the shoulder joint with findings of a chronic rotator cuff injury. Electronically Signed   By: Inez Catalina M.D.   On: 11/22/2019 17:00   DG Elbow Complete Right  Result Date: 11/22/2019 CLINICAL DATA:  Fall 2 days ago with elbow pain, initial encounter EXAM: RIGHT ELBOW - COMPLETE 3+ VIEW COMPARISON:  None. FINDINGS: No acute fracture or dislocation is noted. Small joint effusion is noted with elevation of the anterior fat pad. Mild soft tissue swelling is noted posteriorly. IMPRESSION: Small joint effusion without acute fracture. Mild soft tissue swelling is noted posteriorly. Electronically Signed   By: Inez Catalina M.D.   On: 11/22/2019 17:05   DG Ankle Complete Right  Result Date: 11/22/2019 CLINICAL DATA:  Recent fall with right ankle pain, initial encounter EXAM: RIGHT ANKLE - COMPLETE 3+ VIEW COMPARISON:  None. FINDINGS: Generalized osteopenia is noted. No soft tissue  abnormality is seen. No acute fracture or dislocation is noted. Tarsal degenerative changes are seen. IMPRESSION: Chronic changes without acute abnormality. Electronically Signed   By: Inez Catalina M.D.   On: 11/22/2019 17:12   CT Head Wo Contrast  Result Date: 11/22/2019 CLINICAL DATA:  86 year old female status post fall 3 days ago with pain. EXAM: CT HEAD WITHOUT CONTRAST TECHNIQUE: Contiguous axial images were obtained from the base of the skull through the vertex without intravenous contrast. COMPARISON:  Brain MRI 08/03/2017.  Head CT 10/06/2017. FINDINGS: Brain: Cerebral volume is not significantly changed since 2019. Patchy and confluent bilateral white matter hypodensity appears increased. No midline shift, ventriculomegaly, mass effect, evidence of mass lesion, intracranial hemorrhage or evidence of cortically based acute infarction. Vascular: Calcified atherosclerosis at the skull base. No suspicious intracranial vascular hyperdensity. Skull: No acute osseous abnormality identified. Sinuses/Orbits: New low-density fluid or mucosal thickening in the right posterior ethmoid, sphenoid sinuses. Other visible sinuses, tympanic cavities and mastoids remain well pneumatized. Other: No acute orbit or scalp soft tissue finding. IMPRESSION: 1. No acute intracranial abnormality or recent traumatic injury identified. 2. Progressed chronic white matter disease since  2019. 3. New mild posterior ethmoid and sphenoid sinus inflammation. Electronically Signed   By: Genevie Ann M.D.   On: 11/22/2019 19:55   CT Lumbar Spine Wo Contrast  Result Date: 11/22/2019 CLINICAL DATA:  Fall.  Severe back pain EXAM: CT LUMBAR SPINE WITHOUT CONTRAST TECHNIQUE: Multidetector CT imaging of the lumbar spine was performed without intravenous contrast administration. Multiplanar CT image reconstructions were also generated. COMPARISON:  Lumbar spine radiographs 02/23/2015 FINDINGS: Segmentation: Normal Alignment: Moderate  levoscoliosis.  Normal sagittal alignment. Vertebrae: Severe compression fracture L1 vertebral body. This has progressed since the prior study in 2017 when there was a mild fracture of the superior endplate of L1. Mild bony retropulsion of the superior endplate of L1 into the canal causing mild spinal stenosis. No other fracture Paraspinal and other soft tissues: No significant paraspinous edema at the L1 fracture. Atherosclerotic aorta without aneurysm Cholelithiasis Large fluid collection in the left pelvis measuring 4.5 x 7 cm. This appears to be adnexal in location. Disc levels: Multilevel disc and facet degeneration throughout the lumbar spine. Mild spinal stenosis at L1 due to posterior retropulsion of superior endplate. No other areas of significant spinal stenosis. IMPRESSION: 1. Severe compression fracture of L1, with progression since 2017. This could be a superimposed acute fracture or chronic fracture. Mild spinal stenosis due to retropulsion into the canal. No other fracture. 2. Cholelithiasis 3. 4.5 x 7 mm fluid collection left pelvis likely from the adnexa. Because this lesion is not adequately characterized, prompt [place] is recommended for further evaluation. Note: This recommendation does not apply to premenarchal patients and to those with increased risk (genetic, family history, elevated tumor markers or other high-risk factors) of ovarian cancer. Reference: JACR 2020 Feb; 17(2):248-254 Electronically Signed   By: Franchot Gallo M.D.   On: 11/22/2019 16:17   CT PELVIS WO CONTRAST  Result Date: 11/22/2019 CLINICAL DATA:  Fall several days ago with pelvic pain, initial encounter EXAM: CT PELVIS WITHOUT CONTRAST TECHNIQUE: Multidetector CT imaging of the pelvis was performed following the standard protocol without intravenous contrast. COMPARISON:  CT of the right femur from earlier in the same day, CT from 09/18/2014. FINDINGS: Urinary Tract:  Bladder is partially distended. Bowel: Scattered feet  fecal material is noted throughout the colon. Mild diverticular changes noted. Some very mild inflammatory changes are noted surrounding the distal descending colon which may represent some very mild diverticulitis. Vascular/Lymphatic: Atherosclerotic calcifications are noted without aneurysmal dilatation. No sizable lymphadenopathy is noted. Reproductive: Uterus is within normal limits. Scattered fluid attenuation lesions are noted within the adnexa. The largest of these lies on the left measuring approximately 6.8 cm. This is similar to that seen on prior CT from 2016 and consistent with a benign etiology. Other:  None. Musculoskeletal: Degenerative changes of lumbar spine are noted. The sacrum appears within normal limits. Old healed fractures of the superior and inferior pubic rami on the right are noted. There are changes of acute fracture adjacent to the pubic symphysis in both the inferior and superior pubic rami. This corresponds with that seen on prior CT as well as the recent plain film evaluation. Some adjacent stranding is noted consistent with localized edema/hemorrhage related to the injury. No focal confluent hematoma is seen. IMPRESSION: Fractures of the left superior and inferior pubic rami adjacent to the pubic symphysis as described above similar to that seen on prior CT of the femur. Large left adnexal cyst similar to that noted on a prior CT from 2016. Given its long-term stability  it is felt to be benign in etiology. Changes suggestive of early diverticulitis in the distal descending colon. Electronically Signed   By: Inez Catalina M.D.   On: 11/22/2019 19:58   CT FEMUR RIGHT WO CONTRAST  Result Date: 11/22/2019 CLINICAL DATA:  Severe right thigh pain after fall on Saturday. EXAM: CT OF THE LOWER RIGHT EXTREMITY WITHOUT CONTRAST TECHNIQUE: Multidetector CT imaging of the right lower extremity was performed according to the standard protocol. COMPARISON:  Right femur x-rays dated August 03, 2017. FINDINGS: Bones/Joint/Cartilage Partially visualized acute parasymphyseal fractures of the left superior and inferior pubic rami. Old healed fractures of the right superior and inferior pubic rami. No dislocation. Serpiginous subchondral sclerosis in the medial tibial plateau likely represents a a bone infarct. Mild tricompartmental degenerative changes of the knee. The right hip joint space is preserved. Osteopenia. No joint effusion. Ligaments Ligaments are suboptimally evaluated by CT. Muscles and Tendons Grossly intact. Atrophy of the semimembranosus and biceps femoris muscles. Soft tissue Small amount of ill-defined high-density material in the superficial fat of the right hip along the inferior aspect of the greater trochanter, likely hemorrhage. No soft tissue mass. 6.9 x 5.2 cm simple appearing cystic lesion arising from the left ovary. IMPRESSION: 1. Partially visualized acute parasymphyseal fractures of the left superior and inferior pubic rami. Old healed fractures of the right superior and inferior pubic rami. 2. Small ill-defined hematoma in the superficial fat of the right hip along the inferior aspect of the greater trochanter. No underlying fracture. 3. 6.9 cm cystic lesion arising from the left ovary. Because this lesion is not adequately characterized, prompt [place] is recommended for further evaluation. Note: This recommendation does not apply to premenarchal patients and to those with increased risk (genetic, family history, elevated tumor markers or other high-risk factors) of ovarian cancer. Reference: JACR 2020 Feb; 17(2):248-254 Electronically Signed   By: Titus Dubin M.D.   On: 11/22/2019 16:34   DG Chest Port 1 View  Result Date: 11/22/2019 CLINICAL DATA:  Admission for pneumonia, known fractures of the shoulder and pelvis. EXAM: PORTABLE CHEST 1 VIEW COMPARISON:  Chest x-ray 02/11/2018, CT chest 01/31/2017 FINDINGS: Wireless cardiac device overlies the left chest. The heart size  and mediastinal contours are unchanged. Redemonstration of a large lucency overlying the mediastinum consistent with known hiatal hernia. Aortic arch calcification. Right base airspace opacity as well as right trace pleural effusion. No definite consolidation within the left lung with persistent passive atelectasis of the lingula. No pulmonary edema. No left pleural effusion. No pneumothorax. No acute displaced fracture identified. Per history, there is a known shoulder fracture does not visualized on this study and may be collimated off view. IMPRESSION: 1. Right basilar airspace opacity with associated trace pleural effusion suggestive of infection/inflammation. Followup PA and lateral chest X-ray is recommended in 3-4 weeks following trial of antibiotic therapy to ensure resolution and exclude underlying malignancy. 2. Large hiatal hernia. 3.  Aortic Atherosclerosis (ICD10-I70.0). Electronically Signed   By: Iven Finn M.D.   On: 11/22/2019 20:48   DG Knee Complete 4 Views Right  Result Date: 11/22/2019 CLINICAL DATA:  Recent fall with right knee pain, initial encounter EXAM: RIGHT KNEE - COMPLETE 4+ VIEW COMPARISON:  08/03/2017 FINDINGS: No acute fracture or dislocation is noted. Calcification in the medial tibial plateau is noted without acute abnormality. This is stable from the prior exam. No acute abnormality noted. IMPRESSION: No acute abnormality noted. Electronically Signed   By: Linus Mako.D.  On: 11/22/2019 17:09    Assessment/Plan 1. Fall, sequela -in [place] enhanced with scapular and pubic fxs -in rehab for PT, OT though delirium and pain limit this--seems we need to focus more on her comfort   2. Closed fracture of right scapula, unspecified part of scapula, initial encounter -needs sling, cont tylenol and tramadol and oxycodone--she is refusing them though she's yelling out in what appears to be pain -perhaps a liquid medication would be more helpful -also has muscle  relaxers  3. Pneumonia of right lower lobe due to infectious organism -completed course of abx, no audible rhonchi  4. Acute metabolic encephalopathy -multifactorial as discussed above -likely worsened by pain regimen, but she is also hurting and needs treatment  -might consider change to liquid so easier for her to receive and need to speak with [name], her son, about goals at this time -overall goals have been comfort-based even prior to this -she would benefit from hospice now  5. Expressive aphasia -since her hemorrhage in the past, makes her communication even more challenging and now she's delirious, too  6. 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 Persistent atrial fibrillation ([place]) -on baby asa, amiodarone, coreg, cont same and monitor but may need to decrease pill burden now  8. Microscopic colitis, unspecified microscopic colitis type -had some loose stool in hospital, but sounds like her bowel regimen had been excessive for her intake at that point, monitor--no reported concerns with this here thus far, previously required budesonide tx  9. Stress fracture of left pubic ramus -also occurred with fall (not stress, was mechanical) -cont pain regimen  10. Compression fracture of L1 vertebra, sequela -felt to be old  6. Left ovarian cyst -previously seen and not felt to be different, goals do not warrant investigation  Family/ staff Communication: discussed with rehab team  Labs/tests ordered:  No new  Need to speak with her sons  Wafa [name] L. Aroush Chasse, D.O. [facility] [address], [place] [address] Cell Phone (Mon-Fri 8am-5pm):  [phone] On Call:  [phone] & follow prompts after 5pm & weekends Office Phone:  [phone] Office Fax:   [phone]

## 2019-12-01 DIAGNOSIS — R4701 Aphasia: Secondary | ICD-10-CM | POA: Diagnosis not present

## 2019-12-01 DIAGNOSIS — J189 Pneumonia, unspecified organism: Secondary | ICD-10-CM | POA: Diagnosis not present

## 2019-12-01 DIAGNOSIS — R2689 Other abnormalities of gait and mobility: Secondary | ICD-10-CM | POA: Diagnosis not present

## 2019-12-01 DIAGNOSIS — R278 Other lack of coordination: Secondary | ICD-10-CM | POA: Diagnosis not present

## 2019-12-01 DIAGNOSIS — M6389 Disorders of muscle in diseases classified elsewhere, multiple sites: Secondary | ICD-10-CM | POA: Diagnosis not present

## 2019-12-01 DIAGNOSIS — S42191S Fracture of other part of scapula, right shoulder, sequela: Secondary | ICD-10-CM | POA: Diagnosis not present

## 2019-12-01 DIAGNOSIS — I69151 Hemiplegia and hemiparesis following nontraumatic intracerebral hemorrhage affecting right dominant side: Secondary | ICD-10-CM | POA: Diagnosis not present

## 2019-12-01 DIAGNOSIS — G9341 Metabolic encephalopathy: Secondary | ICD-10-CM | POA: Diagnosis not present

## 2019-12-02 DIAGNOSIS — S42191S Fracture of other part of scapula, right shoulder, sequela: Secondary | ICD-10-CM | POA: Diagnosis not present

## 2019-12-02 DIAGNOSIS — G9341 Metabolic encephalopathy: Secondary | ICD-10-CM | POA: Diagnosis not present

## 2019-12-02 DIAGNOSIS — M6389 Disorders of muscle in diseases classified elsewhere, multiple sites: Secondary | ICD-10-CM | POA: Diagnosis not present

## 2019-12-02 DIAGNOSIS — J189 Pneumonia, unspecified organism: Secondary | ICD-10-CM | POA: Diagnosis not present

## 2019-12-02 DIAGNOSIS — R278 Other lack of coordination: Secondary | ICD-10-CM | POA: Diagnosis not present

## 2019-12-02 DIAGNOSIS — R4701 Aphasia: Secondary | ICD-10-CM | POA: Diagnosis not present

## 2019-12-02 DIAGNOSIS — R2689 Other abnormalities of gait and mobility: Secondary | ICD-10-CM | POA: Diagnosis not present

## 2019-12-02 DIAGNOSIS — I69151 Hemiplegia and hemiparesis following nontraumatic intracerebral hemorrhage affecting right dominant side: Secondary | ICD-10-CM | POA: Diagnosis not present

## 2019-12-03 ENCOUNTER — Encounter: Payer: Self-pay | Admitting: Internal Medicine

## 2019-12-03 DIAGNOSIS — R4701 Aphasia: Secondary | ICD-10-CM | POA: Diagnosis not present

## 2019-12-03 DIAGNOSIS — M6389 Disorders of muscle in diseases classified elsewhere, multiple sites: Secondary | ICD-10-CM | POA: Diagnosis not present

## 2019-12-03 DIAGNOSIS — R2689 Other abnormalities of gait and mobility: Secondary | ICD-10-CM | POA: Diagnosis not present

## 2019-12-03 DIAGNOSIS — I69151 Hemiplegia and hemiparesis following nontraumatic intracerebral hemorrhage affecting right dominant side: Secondary | ICD-10-CM | POA: Diagnosis not present

## 2019-12-03 DIAGNOSIS — J189 Pneumonia, unspecified organism: Secondary | ICD-10-CM | POA: Diagnosis not present

## 2019-12-03 DIAGNOSIS — G9341 Metabolic encephalopathy: Secondary | ICD-10-CM | POA: Diagnosis not present

## 2019-12-03 DIAGNOSIS — R278 Other lack of coordination: Secondary | ICD-10-CM | POA: Diagnosis not present

## 2019-12-03 DIAGNOSIS — S42191S Fracture of other part of scapula, right shoulder, sequela: Secondary | ICD-10-CM | POA: Diagnosis not present

## 2019-12-06 DIAGNOSIS — S42191S Fracture of other part of scapula, right shoulder, sequela: Secondary | ICD-10-CM | POA: Diagnosis not present

## 2019-12-06 DIAGNOSIS — G9341 Metabolic encephalopathy: Secondary | ICD-10-CM | POA: Diagnosis not present

## 2019-12-06 DIAGNOSIS — J189 Pneumonia, unspecified organism: Secondary | ICD-10-CM | POA: Diagnosis not present

## 2019-12-06 DIAGNOSIS — R2689 Other abnormalities of gait and mobility: Secondary | ICD-10-CM | POA: Diagnosis not present

## 2019-12-06 DIAGNOSIS — R278 Other lack of coordination: Secondary | ICD-10-CM | POA: Diagnosis not present

## 2019-12-06 DIAGNOSIS — M6389 Disorders of muscle in diseases classified elsewhere, multiple sites: Secondary | ICD-10-CM | POA: Diagnosis not present

## 2019-12-07 DIAGNOSIS — M6389 Disorders of muscle in diseases classified elsewhere, multiple sites: Secondary | ICD-10-CM | POA: Diagnosis not present

## 2019-12-07 DIAGNOSIS — J189 Pneumonia, unspecified organism: Secondary | ICD-10-CM | POA: Diagnosis not present

## 2019-12-07 DIAGNOSIS — R278 Other lack of coordination: Secondary | ICD-10-CM | POA: Diagnosis not present

## 2019-12-07 DIAGNOSIS — R2689 Other abnormalities of gait and mobility: Secondary | ICD-10-CM | POA: Diagnosis not present

## 2019-12-07 DIAGNOSIS — G9341 Metabolic encephalopathy: Secondary | ICD-10-CM | POA: Diagnosis not present

## 2019-12-07 DIAGNOSIS — S42191S Fracture of other part of scapula, right shoulder, sequela: Secondary | ICD-10-CM | POA: Diagnosis not present

## 2019-12-08 DIAGNOSIS — R278 Other lack of coordination: Secondary | ICD-10-CM | POA: Diagnosis not present

## 2019-12-08 DIAGNOSIS — M6389 Disorders of muscle in diseases classified elsewhere, multiple sites: Secondary | ICD-10-CM | POA: Diagnosis not present

## 2019-12-08 DIAGNOSIS — J189 Pneumonia, unspecified organism: Secondary | ICD-10-CM | POA: Diagnosis not present

## 2019-12-08 DIAGNOSIS — S42191S Fracture of other part of scapula, right shoulder, sequela: Secondary | ICD-10-CM | POA: Diagnosis not present

## 2019-12-08 DIAGNOSIS — R2689 Other abnormalities of gait and mobility: Secondary | ICD-10-CM | POA: Diagnosis not present

## 2019-12-08 DIAGNOSIS — G9341 Metabolic encephalopathy: Secondary | ICD-10-CM | POA: Diagnosis not present

## 2019-12-09 DIAGNOSIS — J189 Pneumonia, unspecified organism: Secondary | ICD-10-CM | POA: Diagnosis not present

## 2019-12-09 DIAGNOSIS — G9341 Metabolic encephalopathy: Secondary | ICD-10-CM | POA: Diagnosis not present

## 2019-12-09 DIAGNOSIS — S42191S Fracture of other part of scapula, right shoulder, sequela: Secondary | ICD-10-CM | POA: Diagnosis not present

## 2019-12-09 DIAGNOSIS — M6389 Disorders of muscle in diseases classified elsewhere, multiple sites: Secondary | ICD-10-CM | POA: Diagnosis not present

## 2019-12-09 DIAGNOSIS — R2689 Other abnormalities of gait and mobility: Secondary | ICD-10-CM | POA: Diagnosis not present

## 2019-12-09 DIAGNOSIS — R278 Other lack of coordination: Secondary | ICD-10-CM | POA: Diagnosis not present

## 2019-12-10 DIAGNOSIS — S42191S Fracture of other part of scapula, right shoulder, sequela: Secondary | ICD-10-CM | POA: Diagnosis not present

## 2019-12-10 DIAGNOSIS — G9341 Metabolic encephalopathy: Secondary | ICD-10-CM | POA: Diagnosis not present

## 2019-12-10 DIAGNOSIS — M6389 Disorders of muscle in diseases classified elsewhere, multiple sites: Secondary | ICD-10-CM | POA: Diagnosis not present

## 2019-12-10 DIAGNOSIS — R2689 Other abnormalities of gait and mobility: Secondary | ICD-10-CM | POA: Diagnosis not present

## 2019-12-10 DIAGNOSIS — R278 Other lack of coordination: Secondary | ICD-10-CM | POA: Diagnosis not present

## 2019-12-10 DIAGNOSIS — J189 Pneumonia, unspecified organism: Secondary | ICD-10-CM | POA: Diagnosis not present

## 2019-12-13 ENCOUNTER — Non-Acute Institutional Stay (SKILLED_NURSING_FACILITY): Payer: Medicare Other | Admitting: Adult Health

## 2019-12-13 ENCOUNTER — Encounter: Payer: Self-pay | Admitting: Adult Health

## 2019-12-13 DIAGNOSIS — R531 Weakness: Secondary | ICD-10-CM | POA: Diagnosis not present

## 2019-12-13 DIAGNOSIS — Z9181 History of falling: Secondary | ICD-10-CM | POA: Diagnosis not present

## 2019-12-13 DIAGNOSIS — M6389 Disorders of muscle in diseases classified elsewhere, multiple sites: Secondary | ICD-10-CM | POA: Diagnosis not present

## 2019-12-13 DIAGNOSIS — S42191S Fracture of other part of scapula, right shoulder, sequela: Secondary | ICD-10-CM | POA: Diagnosis not present

## 2019-12-13 DIAGNOSIS — I4821 Permanent atrial fibrillation: Secondary | ICD-10-CM | POA: Diagnosis not present

## 2019-12-13 DIAGNOSIS — I69151 Hemiplegia and hemiparesis following nontraumatic intracerebral hemorrhage affecting right dominant side: Secondary | ICD-10-CM | POA: Diagnosis not present

## 2019-12-13 DIAGNOSIS — I5032 Chronic diastolic (congestive) heart failure: Secondary | ICD-10-CM | POA: Diagnosis not present

## 2019-12-13 DIAGNOSIS — R4701 Aphasia: Secondary | ICD-10-CM

## 2019-12-13 DIAGNOSIS — S32592D Other specified fracture of left pubis, subsequent encounter for fracture with routine healing: Secondary | ICD-10-CM | POA: Diagnosis not present

## 2019-12-13 DIAGNOSIS — R278 Other lack of coordination: Secondary | ICD-10-CM | POA: Diagnosis not present

## 2019-12-13 DIAGNOSIS — I4819 Other persistent atrial fibrillation: Secondary | ICD-10-CM | POA: Diagnosis not present

## 2019-12-13 DIAGNOSIS — G9341 Metabolic encephalopathy: Secondary | ICD-10-CM | POA: Diagnosis not present

## 2019-12-13 DIAGNOSIS — S42101D Fracture of unspecified part of scapula, right shoulder, subsequent encounter for fracture with routine healing: Secondary | ICD-10-CM | POA: Diagnosis not present

## 2019-12-13 DIAGNOSIS — R2689 Other abnormalities of gait and mobility: Secondary | ICD-10-CM | POA: Diagnosis not present

## 2019-12-13 DIAGNOSIS — S3282XS Multiple fractures of pelvis without disruption of pelvic ring, sequela: Secondary | ICD-10-CM | POA: Diagnosis not present

## 2019-12-13 DIAGNOSIS — J181 Lobar pneumonia, unspecified organism: Secondary | ICD-10-CM | POA: Diagnosis not present

## 2019-12-13 NOTE — Progress Notes (Signed)
Location:  Occupational psychologist of Service:  SNF (31) Provider:   Cindi Carbon, ANP [place] [phone]   [name], DO  Patient Care Team: [name], DO as PCP - General (Geriatric Medicine) Benard Rink (Dentistry) [place], Heather, [place] (Optometry) Sydnee Levans, MD (Dermatology) Thompson Grayer, MD as Consulting Physician (Cardiology) [name], DO as Consulting Physician (Geriatric Medicine)  Extended Emergency Contact Information Primary Emergency Contact: Bartelson,Jim Address: [address]          [place], [place] [address] Johnnette Litter of [place] Phone: [phone] Mobile Phone: [phone] Relation: Son Secondary Emergency Contact: [name] States of [place] Phone: [phone] Mobile Phone: [phone] Relation: Son  Code Status:  DNR Goals of care: Advanced Directive information Advanced Directives 11/22/2019  Does Patient Have a Medical Advance Directive? Yes  Type of Advance Directive Out of facility DNR (pink MOST or yellow form)  Does patient want to make changes to medical advance directive? No - Patient declined  Copy of [place] in Chart? -  Would patient like information on creating a medical advance directive? -  Pre-existing out of facility DNR order (yellow form or pink MOST form) -     Chief Complaint  Patient presents with  . Acute Visit    f/u fall with scapular fx and pelvic fx    HPI: . Pt is a 86 y.o. female seen today for an acute visit for f/u regarding a fall.  She was admitted to the hospital 11/22/19-11/29/19 to multiple fractures after a mechanical fall which included a right scapularfracture,leftsuperior and inferior pubic rami fxsand severecompression fracture of L1 vertebraewith progression since 2017.  She is WBAT to BLE and to the RUE. During her stay she was treated for a possible right basilar CAP with repeat CXR  ordered for 11/18.  She is not having any cough or sob. Sats are WNL on RA. She was also found to have an E Coli UTI and possible early diverticulitis on CT of the abd (received Flagyl and Rocephin).  No current diarrhea or abd pain and no urinary symptoms are reported.  Upon return to the rehab setting after hospitalization she was having some delirium, refusing pain meds and meals. At this time she is back to her baseline. She has had a stroke and has expressive aphasia and RUE weakness which has significantly impacts her care in the past. She is using a hoyer lift for transfers but can sit on the side of the bed. Prior to hospitalization she walked short distances. Pain is controlled with tylenol and ultram.  She is maintaining her weight at 128 and is weighed daily. Taking boost but eats very little during meals which was apparently similar to her baseline in [place] prior to moving to rehab.    Past Medical History:  Diagnosis Date  . Abdominal mass   . AKI (acute kidney injury) ([place]) 09/15/2014  . Atrial fibrillation ([place])   . Colitis, ulcerative ([place])   . Cough    WHITE SPUTUM FOR 2 WEEKS, NO FEVER  . Degenerative joint disease   . Diverticulosis   . DVT (deep venous thrombosis) ([place]) 2009   BOTH LUNGS  . GERD (gastroesophageal reflux disease)   . Hyperlipidemia   . Hyperplastic colon polyp   . Hypertension   . Hypothyroidism   . Iron deficiency anemia    ON IRON AT TIMES  . PE (pulmonary embolism) 2009  .  Persistent atrial fibrillation ([place])   . Positive H. pylori test 01/20/96  . S/P dilatation of esophageal stricture 1992  . Skin cancer    h/o basal and squamous skin cancer--> removed  . Stroke [facility])    Past Surgical History:  Procedure Laterality Date  . APPENDECTOMY    . BREAST EXCISIONAL BIOPSY Left 1992   benign  . BREAST LUMPECTOMY     benign  . DILATION AND CURETTAGE OF UTERUS     for endometrial polyps  . ENDOSCOPIC RETROGRADE CHOLANGIOPANCREATOGRAPHY (ERCP) WITH  PROPOFOL N/A 03/23/2015   Procedure: ENDOSCOPIC RETROGRADE CHOLANGIOPANCREATOGRAPHY (ERCP) WITH PROPOFOL;  Surgeon: Milus Banister, MD;  Location: [facility];  Service: Endoscopy;  Laterality: N/A;  . JOINT REPLACEMENT    . LOOP RECORDER INSERTION N/A 10/14/2017   Procedure: LOOP RECORDER INSERTION;  Surgeon: Thompson Grayer, MD;  Location: [facility];  Service: Cardiovascular;  Laterality: N/A;  . ORIF ANKLE FRACTURE Left    X 2  . REPLACEMENT TOTAL KNEE     left  . SURGERY DONE TO REMOVE LEFT ANKLE HARDWARE LATER    . TONSILLECTOMY      Allergies  Allergen Reactions  . Iohexol Hives  . Ivp Dye [Iodinated Diagnostic Agents] Hives    Was able to tolerate Contrast Media for CT scan.  [name] [Shellfish Allergy] Nausea And Vomiting    Projectile vomiting  . Shellfish-Derived Products Nausea And Vomiting  . Sulfa Antibiotics Hives    Outpatient Encounter Medications as of 12/13/2019  Medication Sig  . acetaminophen (TYLENOL) 325 MG tablet Take 2 tablets (650 mg total) by mouth every 6 (six) hours.  Marland Kitchen amiodarone (PACERONE) 100 MG tablet Take 1 tablet (100 mg total) by mouth daily.  Marland Kitchen aspirin EC 81 MG EC tablet Take 1 tablet (81 mg total) by mouth daily. Swallow whole.  Marland Kitchen atorvastatin (LIPITOR) 10 MG tablet Take 10 mg by mouth daily.  . Ca Phosphate-Cholecalciferol (CALTRATE GUMMY BITES PO) Take 2 tablets by mouth 2 (two) times daily.   . carvedilol (COREG) 6.25 MG tablet Take 6.25 mg by mouth 2 (two) times daily with a meal. Hold for SBP <110 or HR <50  . hydrALAZINE (APRESOLINE) 10 MG tablet Take 1 tablet (10 mg total) by mouth every 8 (eight) hours.  . hydroxypropyl methylcellulose / hypromellose (GONIOVISC) 2.5 % ophthalmic solution Place 1 drop into both eyes 3 (three) times daily as needed for dry eyes.   Marland Kitchen ipratropium-albuterol (DUONEB) 0.5-2.5 (3) MG/3ML SOLN Take 3 mLs by nebulization every 6 (six) hours as needed (SOB/Wheezing /cough).   . lactose free nutrition (BOOST  PLUS) LIQD Take 237 mLs by mouth daily.  Marland Kitchen levothyroxine (SYNTHROID) 75 MCG tablet Take 1 tablet (75 mcg total) by mouth daily before breakfast.  . melatonin 10 MG TABS Take 10 mg by mouth at bedtime as needed (sleep).  . Menthol-Methyl Salicylate (ICY HOT EXTRA STRENGTH) 10-30 % CREA Apply 1 application topically 2 (two) times daily. Right shoulder  . ondansetron (ZOFRAN) 4 MG tablet Take 4 mg by mouth every 6 (six) hours as needed for nausea or vomiting.  Marland Kitchen oxyCODONE (OXY IR/ROXICODONE) 5 MG immediate release tablet Take 1 tablet (5 mg total) by mouth every 4 (four) hours as needed for severe pain.  . pantoprazole (PROTONIX) 40 MG tablet Take 40 mg by mouth daily.  Marland Kitchen senna (SENOKOT) 8.6 MG TABS tablet Take 1 tablet (8.6 mg total) by mouth daily as needed for mild constipation.  . Simethicone (  GAS RELIEF PO) Take 1 tablet by mouth 2 (two) times daily.  Marland Kitchen tiZANidine (ZANAFLEX) 2 MG tablet Take 1 tablet (2 mg total) by mouth 4 (four) times daily -  with meals and at bedtime.  . traMADol (ULTRAM) 50 MG tablet Take 1 tablet (50 mg total) by mouth every 6 (six) hours as needed for moderate pain.  . WITCH HAZEL EX Apply 1 application topically 2 (two) times daily as needed (for comfort). Witch hazel pads   No facility-administered encounter medications on file as of 12/13/2019.    Review of Systems  Constitutional: Positive for activity change. Negative for appetite change, chills, diaphoresis, fatigue, fever and unexpected weight change.  HENT: Negative for congestion.   Respiratory: Negative for cough, shortness of breath and wheezing.   Cardiovascular: Negative for chest pain, palpitations and leg swelling.  Gastrointestinal: Negative for abdominal distention, abdominal pain, constipation and diarrhea.  Genitourinary: Negative for decreased urine volume, difficulty urinating, frequency and hematuria.  Musculoskeletal: Positive for arthralgias and gait problem.  Neurological: Positive for speech  difficulty and weakness. Negative for dizziness, seizures, facial asymmetry, light-headedness and numbness.  Psychiatric/Behavioral: Positive for confusion. Negative for agitation and behavioral problems.    Immunization History  Administered Date(s) Administered  . H1N1 03/04/2008  . Influenza Split 11/12/2010, 11/26/2011  . Influenza Whole 11/26/2006, 11/03/2007, 12/10/2007, 11/16/2008, 11/20/2009  . Influenza, High Dose Seasonal PF 12/10/2018  . Influenza,inj,Quad PF,6+ Mos 11/24/2012, 11/29/2013, 10/14/2014, 11/28/2015, 12/04/2016, 12/11/2017  . Moderna SARS-COVID-2 Vaccination 02/20/2019, 03/24/2019  . PPD Test 10/03/2014  . Pneumococcal Conjugate-13 09/10/2013  . Pneumococcal Polysaccharide-23 02/12/2000  . Td 02/11/1998, 04/13/2008  . Zoster 05/08/2006   Pertinent  Health Maintenance Due  Topic Date Due  . INFLUENZA VACCINE  09/12/2019  . DEXA SCAN  Completed  . PNA vac Low Risk Adult  Completed   Fall Risk  10/20/2019 04/14/2019 09/16/2018 03/18/2018 11/05/2017  Falls in the past year? 0 0 0 0 No  Number falls in past yr: 0 0 0 0 -  Comment - - - - -  Injury with Fall? 0 0 0 0 -  Risk for fall due to : History of fall(s);Impaired balance/gait;Impaired mobility;Mental status change - - - -  Follow up Falls evaluation completed;Education provided;Falls prevention discussed - - - -   Functional Status Survey:    Vitals:   12/13/19 1100  BP: (!) 149/76  Pulse: 61  Resp: 18  Temp: 98.3 F (36.8 C)  SpO2: 98%   There is no height or weight on file to calculate BMI. Physical Exam Vitals and nursing note reviewed.  Constitutional:      General: She is not in acute distress.    Appearance: She is not diaphoretic.  HENT:     Head: Normocephalic and atraumatic.     Mouth/Throat:     Mouth: Mucous membranes are moist.     Pharynx: Oropharynx is clear.  Eyes:     Conjunctiva/sclera: Conjunctivae normal.     Pupils: Pupils are equal, round, and reactive to light.  Neck:      Vascular: No JVD.  Cardiovascular:     Rate and Rhythm: Normal rate. Rhythm irregular.     Heart sounds: Murmur heard.   Pulmonary:     Effort: Pulmonary effort is normal. No respiratory distress.     Breath sounds: Normal breath sounds. No wheezing.  Abdominal:     General: Abdomen is flat. Bowel sounds are normal. There is no distension.  Palpations: Abdomen is soft.     Tenderness: There is no right CVA tenderness or left CVA tenderness.  Musculoskeletal:     Right lower leg: No edema.     Left lower leg: No edema.     Comments: Right shoulder with mild tenderness to the scapular area. +CMS to RUE. Mild edema to the right shoulder noted. Mild pain with gentle ROM to both hips. BPPP+2  Skin:    General: Skin is warm and dry.  Neurological:     Mental Status: She is alert.     Comments: Difficulty with words. Oriented to self and place.   Psychiatric:        Mood and Affect: Mood normal.     Labs reviewed: Recent Labs    11/27/19 0553 11/28/19 0504 11/29/19 0441  NA 139 140 138  K 3.4* 4.1 3.8  CL 109 114* 108  CO2 19* 18* 17*  GLUCOSE 104* 93 99  BUN 49* 29* 27*  CREATININE 1.62* 1.25* 1.20*  CALCIUM 7.8* 8.0* 7.9*  MG 2.0 1.8 2.1  PHOS 2.2* 1.7* 2.9   Recent Labs    11/27/19 0553 11/28/19 0504 11/29/19 0441  AST 16 14* 16  ALT 10 10 11   ALKPHOS 103 95 97  BILITOT 0.4 0.5 0.4  PROT 5.8* 5.4* 5.4*  ALBUMIN 2.6* 2.4* 2.3*   Recent Labs    11/27/19 0553 11/28/19 0504 11/29/19 0441  WBC 10.2 7.1 7.3  NEUTROABS 8.2* 5.7 5.6  HGB 9.0* 8.5* 9.2*  HCT 28.2* 26.9* 28.4*  MCV 92.5 93.1 92.8  PLT 217 206 257   Lab Results  Component Value Date   TSH 0.94 07/13/2019   Lab Results  Component Value Date   HGBA1C 5.3 08/02/2013   Lab Results  Component Value Date   CHOL 92 11/27/2019   HDL 30 (L) 11/27/2019   LDLCALC 46 11/27/2019   LDLDIRECT 85 08/18/2008   TRIG 82 11/27/2019   CHOLHDL 3.1 11/27/2019    Significant Diagnostic Results in  last 30 days:  DG Pelvis 1-2 Views  Result Date: 11/22/2019 CLINICAL DATA:  Recent fall with pelvic pain, initial encounter EXAM: PELVIS - 1-2 VIEW COMPARISON:  08/03/2017 FINDINGS: Pelvic ring is intact. Fragmentation is noted along the superior pubic ramus on the left. No other definitive fracture is seen. No soft tissue abnormality is noted. IMPRESSION: Left superior pubic ramus fracture. No other focal abnormality is noted. Electronically Signed   By: Inez Catalina M.D.   On: 11/22/2019 17:15   DG Shoulder Right  Result Date: 11/22/2019 CLINICAL DATA:  Recent fall with right shoulder pain, initial encounter EXAM: RIGHT SHOULDER - 2+ VIEW COMPARISON:  None. FINDINGS: Degenerative changes of the acromioclavicular joint and glenohumeral joint are seen. The is high-riding articulating with the acromion consistent with chronic rotator cuff injury. Bony density is noted anterior to the humeral head on the Y-view which suggests a coracoid fracture. Additionally fragmentation in the scapular body is noted. IMPRESSION: Changes consistent with scapular fracture in the body as well as at the coracoid process. Cross-sectional imaging may be helpful for further evaluation. Degenerative changes are noted about the shoulder joint with findings of a chronic rotator cuff injury. Electronically Signed   By: Inez Catalina M.D.   On: 11/22/2019 17:00   DG Elbow Complete Right  Result Date: 11/22/2019 CLINICAL DATA:  Fall 2 days ago with elbow pain, initial encounter EXAM: RIGHT ELBOW - COMPLETE 3+ VIEW COMPARISON:  None. FINDINGS: No acute  fracture or dislocation is noted. Small joint effusion is noted with elevation of the anterior fat pad. Mild soft tissue swelling is noted posteriorly. IMPRESSION: Small joint effusion without acute fracture. Mild soft tissue swelling is noted posteriorly. Electronically Signed   By: Inez Catalina M.D.   On: 11/22/2019 17:05   DG Ankle Complete Right  Result Date:  11/22/2019 CLINICAL DATA:  Recent fall with right ankle pain, initial encounter EXAM: RIGHT ANKLE - COMPLETE 3+ VIEW COMPARISON:  None. FINDINGS: Generalized osteopenia is noted. No soft tissue abnormality is seen. No acute fracture or dislocation is noted. Tarsal degenerative changes are seen. IMPRESSION: Chronic changes without acute abnormality. Electronically Signed   By: Inez Catalina M.D.   On: 11/22/2019 17:12   CT Head Wo Contrast  Result Date: 11/22/2019 CLINICAL DATA:  86 year old female status post fall 3 days ago with pain. EXAM: CT HEAD WITHOUT CONTRAST TECHNIQUE: Contiguous axial images were obtained from the base of the skull through the vertex without intravenous contrast. COMPARISON:  Brain MRI 08/03/2017.  Head CT 10/06/2017. FINDINGS: Brain: Cerebral volume is not significantly changed since 2019. Patchy and confluent bilateral white matter hypodensity appears increased. No midline shift, ventriculomegaly, mass effect, evidence of mass lesion, intracranial hemorrhage or evidence of cortically based acute infarction. Vascular: Calcified atherosclerosis at the skull base. No suspicious intracranial vascular hyperdensity. Skull: No acute osseous abnormality identified. Sinuses/Orbits: New low-density fluid or mucosal thickening in the right posterior ethmoid, sphenoid sinuses. Other visible sinuses, tympanic cavities and mastoids remain well pneumatized. Other: No acute orbit or scalp soft tissue finding. IMPRESSION: 1. No acute intracranial abnormality or recent traumatic injury identified. 2. Progressed chronic white matter disease since 2019. 3. New mild posterior ethmoid and sphenoid sinus inflammation. Electronically Signed   By: Genevie Ann M.D.   On: 11/22/2019 19:55   CT Lumbar Spine Wo Contrast  Result Date: 11/22/2019 CLINICAL DATA:  Fall.  Severe back pain EXAM: CT LUMBAR SPINE WITHOUT CONTRAST TECHNIQUE: Multidetector CT imaging of the lumbar spine was performed without intravenous  contrast administration. Multiplanar CT image reconstructions were also generated. COMPARISON:  Lumbar spine radiographs 02/23/2015 FINDINGS: Segmentation: Normal Alignment: Moderate levoscoliosis.  Normal sagittal alignment. Vertebrae: Severe compression fracture L1 vertebral body. This has progressed since the prior study in 2017 when there was a mild fracture of the superior endplate of L1. Mild bony retropulsion of the superior endplate of L1 into the canal causing mild spinal stenosis. No other fracture Paraspinal and other soft tissues: No significant paraspinous edema at the L1 fracture. Atherosclerotic aorta without aneurysm Cholelithiasis Large fluid collection in the left pelvis measuring 4.5 x 7 cm. This appears to be adnexal in location. Disc levels: Multilevel disc and facet degeneration throughout the lumbar spine. Mild spinal stenosis at L1 due to posterior retropulsion of superior endplate. No other areas of significant spinal stenosis. IMPRESSION: 1. Severe compression fracture of L1, with progression since 2017. This could be a superimposed acute fracture or chronic fracture. Mild spinal stenosis due to retropulsion into the canal. No other fracture. 2. Cholelithiasis 3. 4.5 x 7 mm fluid collection left pelvis likely from the adnexa. Because this lesion is not adequately characterized, prompt [place] is recommended for further evaluation. Note: This recommendation does not apply to premenarchal patients and to those with increased risk (genetic, family history, elevated tumor markers or other high-risk factors) of ovarian cancer. Reference: JACR 2020 Feb; 17(2):248-254 Electronically Signed   By: Franchot Gallo M.D.   On: 11/22/2019 16:17  CT PELVIS WO CONTRAST  Result Date: 11/22/2019 CLINICAL DATA:  Fall several days ago with pelvic pain, initial encounter EXAM: CT PELVIS WITHOUT CONTRAST TECHNIQUE: Multidetector CT imaging of the pelvis was performed following the standard protocol without  intravenous contrast. COMPARISON:  CT of the right femur from earlier in the same day, CT from 09/18/2014. FINDINGS: Urinary Tract:  Bladder is partially distended. Bowel: Scattered feet fecal material is noted throughout the colon. Mild diverticular changes noted. Some very mild inflammatory changes are noted surrounding the distal descending colon which may represent some very mild diverticulitis. Vascular/Lymphatic: Atherosclerotic calcifications are noted without aneurysmal dilatation. No sizable lymphadenopathy is noted. Reproductive: Uterus is within normal limits. Scattered fluid attenuation lesions are noted within the adnexa. The largest of these lies on the left measuring approximately 6.8 cm. This is similar to that seen on prior CT from 2016 and consistent with a benign etiology. Other:  None. Musculoskeletal: Degenerative changes of lumbar spine are noted. The sacrum appears within normal limits. Old healed fractures of the superior and inferior pubic rami on the right are noted. There are changes of acute fracture adjacent to the pubic symphysis in both the inferior and superior pubic rami. This corresponds with that seen on prior CT as well as the recent plain film evaluation. Some adjacent stranding is noted consistent with localized edema/hemorrhage related to the injury. No focal confluent hematoma is seen. IMPRESSION: Fractures of the left superior and inferior pubic rami adjacent to the pubic symphysis as described above similar to that seen on prior CT of the femur. Large left adnexal cyst similar to that noted on a prior CT from 2016. Given its long-term stability it is felt to be benign in etiology. Changes suggestive of early diverticulitis in the distal descending colon. Electronically Signed   By: Inez Catalina M.D.   On: 11/22/2019 19:58   CT FEMUR RIGHT WO CONTRAST  Result Date: 11/22/2019 CLINICAL DATA:  Severe right thigh pain after fall on Saturday. EXAM: CT OF THE LOWER RIGHT  EXTREMITY WITHOUT CONTRAST TECHNIQUE: Multidetector CT imaging of the right lower extremity was performed according to the standard protocol. COMPARISON:  Right femur x-rays dated August 03, 2017. FINDINGS: Bones/Joint/Cartilage Partially visualized acute parasymphyseal fractures of the left superior and inferior pubic rami. Old healed fractures of the right superior and inferior pubic rami. No dislocation. Serpiginous subchondral sclerosis in the medial tibial plateau likely represents a a bone infarct. Mild tricompartmental degenerative changes of the knee. The right hip joint space is preserved. Osteopenia. No joint effusion. Ligaments Ligaments are suboptimally evaluated by CT. Muscles and Tendons Grossly intact. Atrophy of the semimembranosus and biceps femoris muscles. Soft tissue Small amount of ill-defined high-density material in the superficial fat of the right hip along the inferior aspect of the greater trochanter, likely hemorrhage. No soft tissue mass. 6.9 x 5.2 cm simple appearing cystic lesion arising from the left ovary. IMPRESSION: 1. Partially visualized acute parasymphyseal fractures of the left superior and inferior pubic rami. Old healed fractures of the right superior and inferior pubic rami. 2. Small ill-defined hematoma in the superficial fat of the right hip along the inferior aspect of the greater trochanter. No underlying fracture. 3. 6.9 cm cystic lesion arising from the left ovary. Because this lesion is not adequately characterized, prompt [place] is recommended for further evaluation. Note: This recommendation does not apply to premenarchal patients and to those with increased risk (genetic, family history, elevated tumor markers or other high-risk factors) of ovarian  cancer. Reference: JACR 2020 Feb; 17(2):248-254 Electronically Signed   By: Titus Dubin M.D.   On: 11/22/2019 16:34   DG Chest Port 1 View  Result Date: 11/22/2019 CLINICAL DATA:  Admission for pneumonia, known  fractures of the shoulder and pelvis. EXAM: PORTABLE CHEST 1 VIEW COMPARISON:  Chest x-ray 02/11/2018, CT chest 01/31/2017 FINDINGS: Wireless cardiac device overlies the left chest. The heart size and mediastinal contours are unchanged. Redemonstration of a large lucency overlying the mediastinum consistent with known hiatal hernia. Aortic arch calcification. Right base airspace opacity as well as right trace pleural effusion. No definite consolidation within the left lung with persistent passive atelectasis of the lingula. No pulmonary edema. No left pleural effusion. No pneumothorax. No acute displaced fracture identified. Per history, there is a known shoulder fracture does not visualized on this study and may be collimated off view. IMPRESSION: 1. Right basilar airspace opacity with associated trace pleural effusion suggestive of infection/inflammation. Followup PA and lateral chest X-ray is recommended in 3-4 weeks following trial of antibiotic therapy to ensure resolution and exclude underlying malignancy. 2. Large hiatal hernia. 3.  Aortic Atherosclerosis (ICD10-I70.0). Electronically Signed   By: Iven Finn M.D.   On: 11/22/2019 20:48   DG Knee Complete 4 Views Right  Result Date: 11/22/2019 CLINICAL DATA:  Recent fall with right knee pain, initial encounter EXAM: RIGHT KNEE - COMPLETE 4+ VIEW COMPARISON:  08/03/2017 FINDINGS: No acute fracture or dislocation is noted. Calcification in the medial tibial plateau is noted without acute abnormality. This is stable from the prior exam. No acute abnormality noted. IMPRESSION: No acute abnormality noted. Electronically Signed   By: Inez Catalina M.D.   On: 11/22/2019 17:09    Assessment/Plan  1. Closed fracture of right scapula with routine healing, unspecified part of scapula, subsequent encounter Making small progress Continue to work with PT and OT Ultram and Tylenol for pain  WBAT  2. Closed fracture of ramus of left pubis with routine  healing, subsequent encounter Small progress in pain management and mentation.  Likely will need to stay in skilled care and not return to [facility] Continue to work with PT and OT WBAT   3. Permanent atrial fibrillation (HCC) Rate is controlled. Only on asa due to prior hemorrhage.   4. Expressive aphasia Due to prior CVA  5. Lobar pneumonia ([place]) No current symptoms Order in place for f/u CXR 11/18  6. Weakness Small progress noted. Continues to work with therapy. Skilled level of care.   7. Diastolic CHF Weight is stable, will change weights to M W F  Family/ staff Communication: nurse  Labs/tests ordered:  NA

## 2019-12-14 DIAGNOSIS — I69151 Hemiplegia and hemiparesis following nontraumatic intracerebral hemorrhage affecting right dominant side: Secondary | ICD-10-CM | POA: Diagnosis not present

## 2019-12-14 DIAGNOSIS — S42191S Fracture of other part of scapula, right shoulder, sequela: Secondary | ICD-10-CM | POA: Diagnosis not present

## 2019-12-14 DIAGNOSIS — R278 Other lack of coordination: Secondary | ICD-10-CM | POA: Diagnosis not present

## 2019-12-14 DIAGNOSIS — M6389 Disorders of muscle in diseases classified elsewhere, multiple sites: Secondary | ICD-10-CM | POA: Diagnosis not present

## 2019-12-14 DIAGNOSIS — G9341 Metabolic encephalopathy: Secondary | ICD-10-CM | POA: Diagnosis not present

## 2019-12-14 DIAGNOSIS — R2689 Other abnormalities of gait and mobility: Secondary | ICD-10-CM | POA: Diagnosis not present

## 2019-12-15 DIAGNOSIS — M6389 Disorders of muscle in diseases classified elsewhere, multiple sites: Secondary | ICD-10-CM | POA: Diagnosis not present

## 2019-12-15 DIAGNOSIS — S42191S Fracture of other part of scapula, right shoulder, sequela: Secondary | ICD-10-CM | POA: Diagnosis not present

## 2019-12-15 DIAGNOSIS — R2689 Other abnormalities of gait and mobility: Secondary | ICD-10-CM | POA: Diagnosis not present

## 2019-12-15 DIAGNOSIS — G9341 Metabolic encephalopathy: Secondary | ICD-10-CM | POA: Diagnosis not present

## 2019-12-15 DIAGNOSIS — I69151 Hemiplegia and hemiparesis following nontraumatic intracerebral hemorrhage affecting right dominant side: Secondary | ICD-10-CM | POA: Diagnosis not present

## 2019-12-15 DIAGNOSIS — R278 Other lack of coordination: Secondary | ICD-10-CM | POA: Diagnosis not present

## 2019-12-16 DIAGNOSIS — G9341 Metabolic encephalopathy: Secondary | ICD-10-CM | POA: Diagnosis not present

## 2019-12-16 DIAGNOSIS — I69151 Hemiplegia and hemiparesis following nontraumatic intracerebral hemorrhage affecting right dominant side: Secondary | ICD-10-CM | POA: Diagnosis not present

## 2019-12-16 DIAGNOSIS — R2689 Other abnormalities of gait and mobility: Secondary | ICD-10-CM | POA: Diagnosis not present

## 2019-12-16 DIAGNOSIS — R278 Other lack of coordination: Secondary | ICD-10-CM | POA: Diagnosis not present

## 2019-12-16 DIAGNOSIS — M6389 Disorders of muscle in diseases classified elsewhere, multiple sites: Secondary | ICD-10-CM | POA: Diagnosis not present

## 2019-12-16 DIAGNOSIS — S42191S Fracture of other part of scapula, right shoulder, sequela: Secondary | ICD-10-CM | POA: Diagnosis not present

## 2019-12-17 DIAGNOSIS — G9341 Metabolic encephalopathy: Secondary | ICD-10-CM | POA: Diagnosis not present

## 2019-12-17 DIAGNOSIS — I69151 Hemiplegia and hemiparesis following nontraumatic intracerebral hemorrhage affecting right dominant side: Secondary | ICD-10-CM | POA: Diagnosis not present

## 2019-12-17 DIAGNOSIS — S42191S Fracture of other part of scapula, right shoulder, sequela: Secondary | ICD-10-CM | POA: Diagnosis not present

## 2019-12-17 DIAGNOSIS — R278 Other lack of coordination: Secondary | ICD-10-CM | POA: Diagnosis not present

## 2019-12-17 DIAGNOSIS — R2689 Other abnormalities of gait and mobility: Secondary | ICD-10-CM | POA: Diagnosis not present

## 2019-12-17 DIAGNOSIS — M6389 Disorders of muscle in diseases classified elsewhere, multiple sites: Secondary | ICD-10-CM | POA: Diagnosis not present

## 2019-12-17 LAB — CUP PACEART REMOTE DEVICE CHECK
Date Time Interrogation Session: 20211101233152
Implantable Pulse Generator Implant Date: 20190903

## 2019-12-20 ENCOUNTER — Ambulatory Visit (INDEPENDENT_AMBULATORY_CARE_PROVIDER_SITE_OTHER): Payer: Medicare Other

## 2019-12-20 DIAGNOSIS — I639 Cerebral infarction, unspecified: Secondary | ICD-10-CM

## 2019-12-20 DIAGNOSIS — R278 Other lack of coordination: Secondary | ICD-10-CM | POA: Diagnosis not present

## 2019-12-20 DIAGNOSIS — I69151 Hemiplegia and hemiparesis following nontraumatic intracerebral hemorrhage affecting right dominant side: Secondary | ICD-10-CM | POA: Diagnosis not present

## 2019-12-20 DIAGNOSIS — S42191S Fracture of other part of scapula, right shoulder, sequela: Secondary | ICD-10-CM | POA: Diagnosis not present

## 2019-12-20 DIAGNOSIS — R6889 Other general symptoms and signs: Secondary | ICD-10-CM | POA: Diagnosis not present

## 2019-12-20 DIAGNOSIS — R2689 Other abnormalities of gait and mobility: Secondary | ICD-10-CM | POA: Diagnosis not present

## 2019-12-20 DIAGNOSIS — G9341 Metabolic encephalopathy: Secondary | ICD-10-CM | POA: Diagnosis not present

## 2019-12-20 DIAGNOSIS — M6389 Disorders of muscle in diseases classified elsewhere, multiple sites: Secondary | ICD-10-CM | POA: Diagnosis not present

## 2019-12-21 ENCOUNTER — Non-Acute Institutional Stay (SKILLED_NURSING_FACILITY): Payer: Medicare Other | Admitting: Internal Medicine

## 2019-12-21 DIAGNOSIS — S42101S Fracture of unspecified part of scapula, right shoulder, sequela: Secondary | ICD-10-CM | POA: Diagnosis not present

## 2019-12-21 DIAGNOSIS — S32592S Other specified fracture of left pubis, sequela: Secondary | ICD-10-CM | POA: Diagnosis not present

## 2019-12-21 DIAGNOSIS — S42191S Fracture of other part of scapula, right shoulder, sequela: Secondary | ICD-10-CM | POA: Diagnosis not present

## 2019-12-21 DIAGNOSIS — G9341 Metabolic encephalopathy: Secondary | ICD-10-CM

## 2019-12-21 DIAGNOSIS — I952 Hypotension due to drugs: Secondary | ICD-10-CM | POA: Diagnosis not present

## 2019-12-21 DIAGNOSIS — R278 Other lack of coordination: Secondary | ICD-10-CM | POA: Diagnosis not present

## 2019-12-21 DIAGNOSIS — I69151 Hemiplegia and hemiparesis following nontraumatic intracerebral hemorrhage affecting right dominant side: Secondary | ICD-10-CM | POA: Diagnosis not present

## 2019-12-21 DIAGNOSIS — R2689 Other abnormalities of gait and mobility: Secondary | ICD-10-CM | POA: Diagnosis not present

## 2019-12-21 DIAGNOSIS — M6389 Disorders of muscle in diseases classified elsewhere, multiple sites: Secondary | ICD-10-CM | POA: Diagnosis not present

## 2019-12-21 NOTE — Progress Notes (Signed)
Carelink Summary Report / Loop Recorder 

## 2019-12-22 DIAGNOSIS — I69151 Hemiplegia and hemiparesis following nontraumatic intracerebral hemorrhage affecting right dominant side: Secondary | ICD-10-CM | POA: Diagnosis not present

## 2019-12-22 DIAGNOSIS — M6389 Disorders of muscle in diseases classified elsewhere, multiple sites: Secondary | ICD-10-CM | POA: Diagnosis not present

## 2019-12-22 DIAGNOSIS — S42191S Fracture of other part of scapula, right shoulder, sequela: Secondary | ICD-10-CM | POA: Diagnosis not present

## 2019-12-22 DIAGNOSIS — G9341 Metabolic encephalopathy: Secondary | ICD-10-CM | POA: Diagnosis not present

## 2019-12-22 DIAGNOSIS — R2689 Other abnormalities of gait and mobility: Secondary | ICD-10-CM | POA: Diagnosis not present

## 2019-12-22 DIAGNOSIS — R278 Other lack of coordination: Secondary | ICD-10-CM | POA: Diagnosis not present

## 2019-12-22 NOTE — Progress Notes (Signed)
Location:  Occupational psychologist of Service:  SNF (31) Provider:  Namon Villarin L. Mariea Clonts, D.O., C.M.D.  [name], DO  Patient Care Team: [name], DO as PCP - General (Geriatric Medicine) Benard Rink (Dentistry) [place], [place], [place] (Optometry) Sydnee Levans, MD (Dermatology) Thompson Grayer, MD as Consulting Physician (Cardiology) [name], DO as Consulting Physician (Geriatric Medicine)  Extended Emergency Contact Information Primary Emergency Contact: Janik,Jim Address: [address]          [place], [place] [address] Johnnette Litter of [place] Phone: [phone] Mobile Phone: [phone] Relation: Son Secondary Emergency Contact: [name] States of [place] Phone: [phone] Mobile Phone: [phone] Relation: Son  Code Status:  DNR, MOST Goals of care: Advanced Directive information Advanced Directives 11/22/2019  Does Patient Have a Medical Advance Directive? Yes  Type of Advance Directive Out of facility DNR (pink MOST or yellow form)  Does patient want to make changes to medical advance directive? No - Patient declined  Copy of [place] in Chart? -  Would patient like information on creating a medical advance directive? -  Pre-existing out of facility DNR order (yellow form or pink MOST form) -   Chief Complaint  Patient presents with  . Acute Visit    hypotension    HPI:  Pt is a 86 y.o. female [facility] due to stroke with aphasia and chronic right arm weakness and pain here in rehab s/p fall with right scapular fx, left pelvic fxs and chronic L1 compression fx seen today for an acute visit for hypotension in the afternoons.  Review of BP readings indicates that around 4pm she runs 86/51 to 106/65 and her bp medications are being held in the evening as a result (hydralazine 57m and coreg 6.293m.  AM and late evening bps run more in the normal range the  majority of the time.  Some of them are relatively low, as well.  She is unable to tell me if she's dizzy when that happens.    She was able to walk with therapy today to the door of her SNF room from the bed/chair which wore her out but she is happy about it. Her right shoulder is still painful but a lot better than it used to be.  Past Medical History:  Diagnosis Date  . Abdominal mass   . AKI (acute kidney injury) ([facility]  . Atrial fibrillation ([facility]  . [place]itis, ulcerative ([facility]  . Cough    WHITE SPUTUM FOR 2 WEEKS, NO FEVER  . Degenerative joint disease   . Diverticulosis   . DVT (deep venous thrombosis) ([facility]   BOTH LUNGS  . GERD (gastroesophageal reflux disease)   . Hyperlipidemia   . Hyperplastic colon polyp   . Hypertension   . Hypothyroidism   . Iron deficiency anemia    ON IRON AT TIMES  . PE (pulmonary embolism) 2009  . Persistent atrial fibrillation ([facility]  . Positive H. pylori test 01/20/96  . S/P dilatation of esophageal stricture 1992  . Skin cancer    h/o basal and squamous skin cancer--> removed  . [facility]   Past Surgical History:  Procedure Laterality Date  . APPENDECTOMY    . BREAST EXCISIONAL BIOPSY Left 1992   benign  . BREAST LUMPECTOMY     benign  . DILATION AND CURETTAGE OF UTERUS     for endometrial polyps  . ENDOSCOPIC  RETROGRADE CHOLANGIOPANCREATOGRAPHY (ERCP) WITH PROPOFOL N/A 03/23/2015   Procedure: ENDOSCOPIC RETROGRADE CHOLANGIOPANCREATOGRAPHY (ERCP) WITH PROPOFOL;  Surgeon: Milus Banister, MD;  Location: [facility];  Service: Endoscopy;  Laterality: N/A;  . JOINT REPLACEMENT    . LOOP RECORDER INSERTION N/A 10/14/2017   Procedure: LOOP RECORDER INSERTION;  Surgeon: Thompson Grayer, MD;  Location: [facility];  Service: Cardiovascular;  Laterality: N/A;  . ORIF ANKLE FRACTURE Left    X 2  . REPLACEMENT TOTAL KNEE     left  . SURGERY DONE TO REMOVE LEFT ANKLE HARDWARE LATER    . TONSILLECTOMY      Allergies    Allergen Reactions  . Iohexol Hives  . Ivp Dye [Iodinated Diagnostic Agents] Hives    Was able to tolerate Contrast Media for CT scan.  [name] [Shellfish Allergy] Nausea And Vomiting    Projectile vomiting  . Shellfish-Derived Products Nausea And Vomiting  . Sulfa Antibiotics Hives    Outpatient Encounter Medications as of 12/21/2019  Medication Sig  . acetaminophen (TYLENOL) 325 MG tablet Take 2 tablets (650 mg total) by mouth every 6 (six) hours.  Marland Kitchen amiodarone (PACERONE) 100 MG tablet Take 1 tablet (100 mg total) by mouth daily.  Marland Kitchen aspirin EC 81 MG EC tablet Take 1 tablet (81 mg total) by mouth daily. Swallow whole.  Marland Kitchen atorvastatin (LIPITOR) 10 MG tablet Take 10 mg by mouth daily.  . Ca Phosphate-Cholecalciferol (CALTRATE GUMMY BITES PO) Take 2 tablets by mouth 2 (two) times daily.   . carvedilol (COREG) 6.25 MG tablet Take 6.25 mg by mouth 2 (two) times daily with a meal. Hold for SBP <110 or HR <50  . hydrALAZINE (APRESOLINE) 10 MG tablet Take 1 tablet (10 mg total) by mouth every 8 (eight) hours.  . hydroxypropyl methylcellulose / hypromellose (GONIOVISC) 2.5 % ophthalmic solution Place 1 drop into both eyes 3 (three) times daily as needed for dry eyes.   Marland Kitchen ipratropium-albuterol (DUONEB) 0.5-2.5 (3) MG/3ML SOLN Take 3 mLs by nebulization every 6 (six) hours as needed (SOB/Wheezing /cough).   . lactose free nutrition (BOOST PLUS) LIQD Take 237 mLs by mouth daily.  Marland Kitchen levothyroxine (SYNTHROID) 75 MCG tablet Take 1 tablet (75 mcg total) by mouth daily before breakfast.  . melatonin 10 MG TABS Take 10 mg by mouth at bedtime as needed (sleep).  . Menthol-Methyl Salicylate (ICY HOT EXTRA STRENGTH) 10-30 % CREA Apply 1 application topically 2 (two) times daily. Right shoulder  . ondansetron (ZOFRAN) 4 MG tablet Take 4 mg by mouth every 6 (six) hours as needed for nausea or vomiting.  . pantoprazole (PROTONIX) 40 MG tablet Take 40 mg by mouth daily.  Marland Kitchen senna (SENOKOT) 8.6 MG TABS tablet  Take 1 tablet (8.6 mg total) by mouth daily as needed for mild constipation.  . Simethicone (GAS RELIEF PO) Take 1 tablet by mouth 2 (two) times daily.  Marland Kitchen tiZANidine (ZANAFLEX) 2 MG tablet Take 1 tablet (2 mg total) by mouth 4 (four) times daily -  with meals and at bedtime.  . traMADol (ULTRAM) 50 MG tablet Take 1 tablet (50 mg total) by mouth every 6 (six) hours as needed for moderate pain.  . WITCH HAZEL EX Apply 1 application topically 2 (two) times daily as needed (for comfort). Witch hazel pads   No facility-administered encounter medications on file as of 12/21/2019.    Review of Systems  Constitutional: Negative for chills and fever.  HENT: Negative for congestion and sore throat.  Eyes: Negative for blurred vision.  Respiratory: Negative for shortness of breath.   Cardiovascular: Negative for chest pain, palpitations and leg swelling.  Genitourinary: Negative for dysuria.  Musculoskeletal: Positive for back pain, joint pain and myalgias. Negative for falls.       No more falls since the one that led to her hospitalization and rehab stay  Skin: Negative for rash.  Neurological: Positive for weakness. Negative for dizziness.  Psychiatric/Behavioral: Negative for depression. The patient is not nervous/anxious and does not have insomnia.        Back to baseline    Immunization History  Administered Date(s) Administered  . H1N1 03/04/2008  . Influenza Split 11/12/2010, 11/26/2011  . Influenza Whole 11/26/2006, 11/03/2007, 12/10/2007, 11/16/2008, 11/20/2009  . Influenza, High Dose Seasonal PF 12/10/2018  . Influenza,inj,Quad PF,6+ Mos 11/24/2012, 11/29/2013, 10/14/2014, 11/28/2015, 12/04/2016, 12/11/2017  . Moderna SARS-COVID-2 Vaccination 02/20/2019, 03/24/2019  . PPD Test 10/03/2014  . Pneumococcal Conjugate-13 09/10/2013  . Pneumococcal Polysaccharide-23 02/12/2000  . Td 02/11/1998, 04/13/2008  . Zoster 05/08/2006   Pertinent  Health Maintenance Due  Topic Date Due  .  INFLUENZA VACCINE  09/12/2019  . DEXA SCAN  Completed  . PNA vac Low Risk Adult  Completed   Fall Risk  10/20/2019 04/14/2019 09/16/2018 03/18/2018 11/05/2017  Falls in the past year? 0 0 0 0 No  Number falls in past yr: 0 0 0 0 -  Comment - - - - -  Injury with Fall? 0 0 0 0 -  Risk for fall due to : History of fall(s);Impaired balance/gait;Impaired mobility;Mental status change - - - -  Follow up Falls evaluation completed;Education provided;Falls prevention discussed - - - -   Functional Status Survey:    Vitals:   12/21/19 1628  BP: (!) 102/58  Pulse: 72  Resp: (!) 22  Temp: 98.1 F (36.7 C)  SpO2: 95%  Weight: 128 lb 9.6 oz (58.3 kg)   Body mass index is 24.3 kg/m. Physical Exam Vitals reviewed.  Constitutional:      General: She is not in acute distress.    Appearance: Normal appearance. She is not toxic-appearing.  HENT:     Head: Normocephalic and atraumatic.  Eyes:     Extraocular Movements: Extraocular movements intact.     Pupils: Pupils are equal, round, and reactive to light.  Cardiovascular:     Rate and Rhythm: Rhythm irregular.     Pulses: Normal pulses.     Heart sounds: Normal heart sounds. No murmur heard.   Pulmonary:     Effort: Pulmonary effort is normal.     Breath sounds: Normal breath sounds. No wheezing, rhonchi or rales.  Abdominal:     General: Bowel sounds are normal.     Palpations: Abdomen is soft.     Tenderness: There is no abdominal tenderness. There is no guarding or rebound.  Musculoskeletal:     Right lower leg: No edema.     Left lower leg: No edema.     Comments: Limited ROM right shoulder due to pain and baseline limitation; has not been willing to use sling  Skin:    General: Skin is warm and dry.     Coloration: Skin is pale.  Neurological:     Mental Status: She is alert. Mental status is at baseline.     Motor: Weakness present.     Gait: Gait abnormal.  Psychiatric:        Mood and Affect: Mood normal.  Labs  reviewed: Recent Labs    11/27/19 0553 11/28/19 0504 11/29/19 0441  NA 139 140 138  K 3.4* 4.1 3.8  CL 109 114* 108  CO2 19* 18* 17*  GLUCOSE 104* 93 99  BUN 49* 29* 27*  CREATININE 1.62* 1.25* 1.20*  CALCIUM 7.8* 8.0* 7.9*  MG 2.0 1.8 2.1  PHOS 2.2* 1.7* 2.9   Recent Labs    11/27/19 0553 11/28/19 0504 11/29/19 0441  AST 16 14* 16  ALT 10 10 11   ALKPHOS 103 95 97  BILITOT 0.4 0.5 0.4  PROT 5.8* 5.4* 5.4*  ALBUMIN 2.6* 2.4* 2.3*   Recent Labs    11/27/19 0553 11/28/19 0504 11/29/19 0441  WBC 10.2 7.1 7.3  NEUTROABS 8.2* 5.7 5.6  HGB 9.0* 8.5* 9.2*  HCT 28.2* 26.9* 28.4*  MCV 92.5 93.1 92.8  PLT 217 206 257   Lab Results  Component Value Date   TSH 0.94 07/13/2019   Lab Results  Component Value Date   HGBA1C 5.3 08/02/2013   Lab Results  Component Value Date   CHOL 92 11/27/2019   HDL 30 (L) 11/27/2019   LDLCALC 46 11/27/2019   LDLDIRECT 85 08/18/2008   TRIG 82 11/27/2019   CHOLHDL 3.1 11/27/2019    Significant Diagnostic Results in last 30 days:  CUP PACEART REMOTE DEVICE CHECK  Result Date: 12/17/2019 ILR summary report received. Battery status OK. Normal device function. No new symptom, tachy, brady, or pause episodes. No new AF episodes. Monthly summary reports and ROV/PRN   Assessment/Plan 1. Hypotension due to drugs -will d/c evening hydralazine and lower evening coreg to 3.174m, keep other doses as is for now  -cont to monitor bps and if still low, reassess  2. Closed fracture of right scapula, unspecified part of scapula, sequela -pain improving and mobility overall improving  3. Closed fracture of ramus of left pubis, sequela -ambulated to her door today which was a big step  4. Acute metabolic encephalopathy -resolved  Family/ staff Communication: d/w her daughter-in-law and rehab nurses  Labs/tests ordered:  No new  Savonna Birchmeier L. Diem Dicocco, D.O. GGrey EagleGroup [address] [place] [id number] Phone (Mon-Fri 8am-5pm):  [phone] Call:  [phone]& follow prompts after 5pm & weekends Office Phone:  [phone] Fax:  [phone]

## 2019-12-23 DIAGNOSIS — G9341 Metabolic encephalopathy: Secondary | ICD-10-CM | POA: Diagnosis not present

## 2019-12-23 DIAGNOSIS — R2689 Other abnormalities of gait and mobility: Secondary | ICD-10-CM | POA: Diagnosis not present

## 2019-12-23 DIAGNOSIS — I69151 Hemiplegia and hemiparesis following nontraumatic intracerebral hemorrhage affecting right dominant side: Secondary | ICD-10-CM | POA: Diagnosis not present

## 2019-12-23 DIAGNOSIS — Z23 Encounter for immunization: Secondary | ICD-10-CM | POA: Diagnosis not present

## 2019-12-23 DIAGNOSIS — R278 Other lack of coordination: Secondary | ICD-10-CM | POA: Diagnosis not present

## 2019-12-23 DIAGNOSIS — M6389 Disorders of muscle in diseases classified elsewhere, multiple sites: Secondary | ICD-10-CM | POA: Diagnosis not present

## 2019-12-23 DIAGNOSIS — S42191S Fracture of other part of scapula, right shoulder, sequela: Secondary | ICD-10-CM | POA: Diagnosis not present

## 2019-12-24 ENCOUNTER — Encounter: Payer: Self-pay | Admitting: Internal Medicine

## 2019-12-24 DIAGNOSIS — G9341 Metabolic encephalopathy: Secondary | ICD-10-CM | POA: Diagnosis not present

## 2019-12-24 DIAGNOSIS — R278 Other lack of coordination: Secondary | ICD-10-CM | POA: Diagnosis not present

## 2019-12-24 DIAGNOSIS — I69151 Hemiplegia and hemiparesis following nontraumatic intracerebral hemorrhage affecting right dominant side: Secondary | ICD-10-CM | POA: Diagnosis not present

## 2019-12-24 DIAGNOSIS — M6389 Disorders of muscle in diseases classified elsewhere, multiple sites: Secondary | ICD-10-CM | POA: Diagnosis not present

## 2019-12-24 DIAGNOSIS — S42191S Fracture of other part of scapula, right shoulder, sequela: Secondary | ICD-10-CM | POA: Diagnosis not present

## 2019-12-24 DIAGNOSIS — R2689 Other abnormalities of gait and mobility: Secondary | ICD-10-CM | POA: Diagnosis not present

## 2019-12-24 MED ORDER — HYDRALAZINE HCL 10 MG PO TABS: 10.0000 mg | ORAL_TABLET | Freq: Two times a day (BID) | ORAL | 3 refills | Status: AC

## 2019-12-24 MED ORDER — CARVEDILOL 6.25 MG PO TABS: ORAL_TABLET | ORAL | 3 refills | Status: AC

## 2019-12-27 DIAGNOSIS — I69151 Hemiplegia and hemiparesis following nontraumatic intracerebral hemorrhage affecting right dominant side: Secondary | ICD-10-CM | POA: Diagnosis not present

## 2019-12-27 DIAGNOSIS — M6389 Disorders of muscle in diseases classified elsewhere, multiple sites: Secondary | ICD-10-CM | POA: Diagnosis not present

## 2019-12-27 DIAGNOSIS — S42191S Fracture of other part of scapula, right shoulder, sequela: Secondary | ICD-10-CM | POA: Diagnosis not present

## 2019-12-27 DIAGNOSIS — R278 Other lack of coordination: Secondary | ICD-10-CM | POA: Diagnosis not present

## 2019-12-27 DIAGNOSIS — G9341 Metabolic encephalopathy: Secondary | ICD-10-CM | POA: Diagnosis not present

## 2019-12-27 DIAGNOSIS — R2689 Other abnormalities of gait and mobility: Secondary | ICD-10-CM | POA: Diagnosis not present

## 2019-12-28 DIAGNOSIS — G9341 Metabolic encephalopathy: Secondary | ICD-10-CM | POA: Diagnosis not present

## 2019-12-28 DIAGNOSIS — M6389 Disorders of muscle in diseases classified elsewhere, multiple sites: Secondary | ICD-10-CM | POA: Diagnosis not present

## 2019-12-28 DIAGNOSIS — R2689 Other abnormalities of gait and mobility: Secondary | ICD-10-CM | POA: Diagnosis not present

## 2019-12-28 DIAGNOSIS — I69151 Hemiplegia and hemiparesis following nontraumatic intracerebral hemorrhage affecting right dominant side: Secondary | ICD-10-CM | POA: Diagnosis not present

## 2019-12-28 DIAGNOSIS — R278 Other lack of coordination: Secondary | ICD-10-CM | POA: Diagnosis not present

## 2019-12-28 DIAGNOSIS — S42191S Fracture of other part of scapula, right shoulder, sequela: Secondary | ICD-10-CM | POA: Diagnosis not present

## 2019-12-29 DIAGNOSIS — S42191S Fracture of other part of scapula, right shoulder, sequela: Secondary | ICD-10-CM | POA: Diagnosis not present

## 2019-12-29 DIAGNOSIS — R278 Other lack of coordination: Secondary | ICD-10-CM | POA: Diagnosis not present

## 2019-12-29 DIAGNOSIS — I69151 Hemiplegia and hemiparesis following nontraumatic intracerebral hemorrhage affecting right dominant side: Secondary | ICD-10-CM | POA: Diagnosis not present

## 2019-12-29 DIAGNOSIS — M6389 Disorders of muscle in diseases classified elsewhere, multiple sites: Secondary | ICD-10-CM | POA: Diagnosis not present

## 2019-12-29 DIAGNOSIS — G9341 Metabolic encephalopathy: Secondary | ICD-10-CM | POA: Diagnosis not present

## 2019-12-29 DIAGNOSIS — R2689 Other abnormalities of gait and mobility: Secondary | ICD-10-CM | POA: Diagnosis not present

## 2019-12-30 ENCOUNTER — Non-Acute Institutional Stay (SKILLED_NURSING_FACILITY): Payer: Medicare Other | Admitting: Adult Health

## 2019-12-30 ENCOUNTER — Encounter: Payer: Self-pay | Admitting: Adult Health

## 2019-12-30 DIAGNOSIS — R4701 Aphasia: Secondary | ICD-10-CM | POA: Diagnosis not present

## 2019-12-30 DIAGNOSIS — S32592S Other specified fracture of left pubis, sequela: Secondary | ICD-10-CM | POA: Diagnosis not present

## 2019-12-30 DIAGNOSIS — I4819 Other persistent atrial fibrillation: Secondary | ICD-10-CM | POA: Diagnosis not present

## 2019-12-30 DIAGNOSIS — D649 Anemia, unspecified: Secondary | ICD-10-CM | POA: Diagnosis not present

## 2019-12-30 DIAGNOSIS — S32010S Wedge compression fracture of first lumbar vertebra, sequela: Secondary | ICD-10-CM

## 2019-12-30 DIAGNOSIS — I5032 Chronic diastolic (congestive) heart failure: Secondary | ICD-10-CM | POA: Diagnosis not present

## 2019-12-30 DIAGNOSIS — I10 Essential (primary) hypertension: Secondary | ICD-10-CM | POA: Diagnosis not present

## 2019-12-30 DIAGNOSIS — S42101S Fracture of unspecified part of scapula, right shoulder, sequela: Secondary | ICD-10-CM | POA: Diagnosis not present

## 2019-12-30 NOTE — Progress Notes (Signed)
Location:  Occupational psychologist of Service:  SNF (31)  Provider:  Cindi Carbon, ANP [place] [phone]   PCP: [name], DO Patient Care Team: [name], DO as PCP - General (Geriatric Medicine) Benard Rink (Dentistry) [place], [place], [place] (Optometry) Sydnee Levans, MD (Dermatology) Thompson Grayer, MD as Consulting Physician (Cardiology) [name], DO as Consulting Physician (Geriatric Medicine)  Extended Emergency Contact Information Primary Emergency Contact: Vitelli,Jim Address: [address]          [place], [place] [address] Johnnette Litter of [place] Phone: [phone] Mobile Phone: [phone] Relation: Son Secondary Emergency Contact: [name] [place] of [place] Phone: [phone] Mobile Phone: [phone] Relation: Son  Code Status: DNR Goals of care:  Advanced Directive information Advanced Directives 11/22/2019  Does Patient Have a Medical Advance Directive? Yes  Type of Advance Directive Out of facility DNR (pink MOST or yellow form)  Does patient want to make changes to medical advance directive? No - Patient declined  Copy of [place] in Chart? -  Would patient like information on creating a medical advance directive? -  Pre-existing out of facility DNR order (yellow form or pink MOST form) -     Allergies  Allergen Reactions  . Iohexol Hives  . Ivp Dye [Iodinated Diagnostic Agents] Hives    Was able to tolerate Contrast Media for CT scan.  [name] [Shellfish Allergy] Nausea And Vomiting    Projectile vomiting  . Shellfish-Derived Products Nausea And Vomiting  . Sulfa Antibiotics Hives    Chief Complaint  Patient presents with  . Medical Management of Chronic Issues    HPI:  86 y.o. female seen for discharge from skilled rehab back to enhanced assisted living. She was in the hospital due to a significant fall  11/22/19-11/29/19 which  caused a right scapular fracture, left superior and inferio pubic rami fxs, and compression fx to the L1 area which was not new. She was also treated with antibiotics for pna an diverticulosis which has resolved. She had metabolic encephalopathy which also resolved. She has been working with PT and OT and is now close to her baseline with additional assistance needed with walking and transfers. Her BP was low during her stay and therefore hydralazine and coreg were reduced. BP now is within normal range. She has some chronic right shoulder pain but other wise denies any pain to the pelvis, back, or scapula. Appetite remains small and likes boost and ice cream which is not new. Denies any constipation or diarrhea.    Past Medical History:  Diagnosis Date  . Abdominal mass   . AKI (acute kidney injury) ([place]) 09/15/2014  . Atrial fibrillation ([place])   . Colitis, ulcerative ([place])   . Cough    WHITE SPUTUM FOR 2 WEEKS, NO FEVER  . Degenerative joint disease   . Diverticulosis   . DVT (deep venous thrombosis) ([place]) 2009   BOTH LUNGS  . GERD (gastroesophageal reflux disease)   . Hyperlipidemia   . Hyperplastic colon polyp   . Hypertension   . Hypothyroidism   . Iron deficiency anemia    ON IRON AT TIMES  . PE (pulmonary embolism) 2009  . Persistent atrial fibrillation ([place])   . Positive H. pylori test 01/20/96  . S/P dilatation of esophageal stricture 1992  . Skin cancer    h/o basal and squamous skin cancer--> removed  . Stroke [facility])     Past Surgical  History:  Procedure Laterality Date  . APPENDECTOMY    . BREAST EXCISIONAL BIOPSY Left 1992   benign  . BREAST LUMPECTOMY     benign  . DILATION AND CURETTAGE OF UTERUS     for endometrial polyps  . ENDOSCOPIC RETROGRADE CHOLANGIOPANCREATOGRAPHY (ERCP) WITH PROPOFOL N/A 03/23/2015   Procedure: ENDOSCOPIC RETROGRADE CHOLANGIOPANCREATOGRAPHY (ERCP) WITH PROPOFOL;  Surgeon: Milus Banister, MD;  Location: [facility];  Service: Endoscopy;   Laterality: N/A;  . JOINT REPLACEMENT    . LOOP RECORDER INSERTION N/A 10/14/2017   Procedure: LOOP RECORDER INSERTION;  Surgeon: Thompson Grayer, MD;  Location: [facility] LAB;  Service: Cardiovascular;  Laterality: N/A;  . ORIF ANKLE FRACTURE Left    X 2  . REPLACEMENT TOTAL KNEE     left  . SURGERY DONE TO REMOVE LEFT ANKLE HARDWARE LATER    . TONSILLECTOMY        reports that she quit smoking about 58 years ago. Her smoking use included cigarettes. She started smoking about 71 years ago. She has a 6.50 pack-year smoking history. She has never used smokeless tobacco. She reports current alcohol use of about 5.0 standard drinks of alcohol per week. She reports that she does not use drugs. Social History   Socioeconomic History  . Marital status: Widowed    Spouse name: Not on file  . Number of children: 3  . Years of education: college  . Highest education level: Bachelor's degree (e.g., BA, AB, BS)  Occupational History  . Occupation: retired Optician, dispensing: RETIRED  Tobacco Use  . Smoking status: Former Smoker    Packs/day: 0.50    Years: 13.00    Pack years: 6.50    Types: Cigarettes    Start date: 02/12/1948    Quit date: 02/11/1961    Years since quitting: 58.9  . Smokeless tobacco: Never Used  . Tobacco comment: Denies use of tobacco since 1963  Vaping Use  . Vaping Use: Never used  Substance and Sexual Activity  . Alcohol use: Yes    Alcohol/week: 5.0 standard drinks    Types: 5 Glasses of wine per week    Comment: WINE FEW TIMES PER WEEK  . Drug use: No  . Sexual activity: Never  Other Topics Concern  . Not on file  Social History Narrative   Health Care POA:    Emergency Contact: son, Ezra Marquess (c) [phone] [name])   Diet: Pt has a varied diet of protein, starch, vegetables.   Exercise: Pt has no regular exercise plan because of pain in back.   Seatbelts: Pt reports wearing seatbelt when in vehicle.   Nancy Fetter Exposure/Protection: Pt reports wearing sun  screen.   Hobbies: reading, traveling, eating out.      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 [place], RN, BSN                                                                                                    Social Determinants of Health   Financial Resource Strain:   . Difficulty of Paying Living Expenses: Not on file  Food Insecurity:   . Worried About Charity fundraiser in the Last Year: Not on file  . Ran Out of Food in the Last Year: Not on file  Transportation Needs:   . Lack of Transportation (Medical): Not on file  . Lack of Transportation (Non-Medical): Not on file  Physical Activity:   . Days of Exercise per Week: Not on file  . Minutes of Exercise per Session: Not on file  Stress:   . Feeling of Stress : Not on file  Social Connections:   . Frequency of Communication with Friends and Family: Not on file  . Frequency of Social Gatherings with Friends and Family: Not on file  . Attends Religious Services: Not on file  . Active Member of Clubs or Organizations: Not on file  . Attends Archivist Meetings:  Not on file  . Marital Status: Not on file  Intimate Partner Violence:   . Fear of Current or Ex-Partner: Not on file  . Emotionally Abused: Not on file  . Physically Abused: Not on file  . Sexually Abused: Not on file   Functional Status Survey:    Allergies  Allergen Reactions  . Iohexol Hives  . Ivp Dye [Iodinated Diagnostic Agents] Hives    Was able to tolerate Contrast Media for CT scan.  [name] [Shellfish Allergy] Nausea And Vomiting    Projectile vomiting  . Shellfish-Derived Products Nausea And Vomiting  . Sulfa Antibiotics Hives    Pertinent  Health Maintenance Due  Topic Date Due  . INFLUENZA VACCINE  Completed  . DEXA SCAN  Completed  . PNA vac Low Risk Adult  Completed    Medications: Outpatient Encounter Medications as of 12/30/2019  Medication Sig  . acetaminophen (TYLENOL) 325 MG tablet Take 2 tablets (650 mg total) by mouth every 6 (six) hours. (Patient taking differently: Take 650 mg by mouth in the morning and at bedtime. )  . amiodarone (PACERONE) 100 MG tablet Take 1 tablet (100 mg total) by mouth daily.  Marland Kitchen aspirin EC 81 MG EC tablet Take 1 tablet (81 mg total) by mouth daily. Swallow whole.  Marland Kitchen atorvastatin (LIPITOR) 10 MG tablet Take 10 mg by mouth daily.  . Ca Phosphate-Cholecalciferol (CALTRATE GUMMY BITES PO) Take 2 tablets by mouth 2 (two) times daily.   . carvedilol (COREG) 6.25 MG tablet Take 1 tablet (6.25 mg total) by mouth in the morning AND 0.5 tablets (3.125 mg total) every evening.  . hydrALAZINE (APRESOLINE) 10 MG tablet Take  1 tablet (10 mg total) by mouth in the morning and at bedtime.  . hydroxypropyl methylcellulose / hypromellose (GONIOVISC) 2.5 % ophthalmic solution Place 1 drop into both eyes 3 (three) times daily as needed for dry eyes.   . Menthol-Methyl Salicylate (ICY HOT EXTRA STRENGTH) 10-30 % CREA Apply 1 application topically 2 (two) times daily. Right shoulder  . ondansetron (ZOFRAN) 4 MG tablet Take 4 mg by mouth every 6  (six) hours as needed for nausea or vomiting.  . pantoprazole (PROTONIX) 40 MG tablet Take 40 mg by mouth daily.  Marland Kitchen senna (SENOKOT) 8.6 MG TABS tablet Take 1 tablet (8.6 mg total) by mouth daily as needed for mild constipation.  . Simethicone (GAS RELIEF PO) Take 1 tablet by mouth 2 (two) times daily.  Marland Kitchen tiZANidine (ZANAFLEX) 2 MG tablet Take 1 tablet (2 mg total) by mouth 4 (four) times daily -  with meals and at bedtime.  . traMADol (ULTRAM) 50 MG tablet Take 1 tablet (50 mg total) by mouth every 6 (six) hours as needed for moderate pain.  . WITCH HAZEL EX Apply 1 application topically 2 (two) times daily as needed (for comfort). Witch hazel pads  . ipratropium-albuterol (DUONEB) 0.5-2.5 (3) MG/3ML SOLN Take 3 mLs by nebulization every 6 (six) hours as needed (SOB/Wheezing /cough).   . lactose free nutrition (BOOST PLUS) LIQD Take 237 mLs by mouth daily.  Marland Kitchen levothyroxine (SYNTHROID) 75 MCG tablet Take 1 tablet (75 mcg total) by mouth daily before breakfast.  . melatonin 10 MG TABS Take 10 mg by mouth at bedtime as needed (sleep).   No facility-administered encounter medications on file as of 12/30/2019.    Review of Systems  Constitutional: Positive for activity change. Negative for appetite change, chills, diaphoresis, fatigue, fever and unexpected weight change.  HENT: Negative for congestion and trouble swallowing.   Respiratory: Negative for cough, shortness of breath and wheezing.   Cardiovascular: Negative for chest pain, palpitations and leg swelling.  Gastrointestinal: Negative for abdominal distention, abdominal pain, constipation and diarrhea.  Genitourinary: Negative for difficulty urinating and dysuria.  Musculoskeletal: Positive for gait problem. Negative for arthralgias, back pain, joint swelling and myalgias.       Right shoulder pain   Skin: Negative for wound.  Neurological: Positive for speech difficulty. Negative for dizziness, tremors, seizures, syncope, facial  asymmetry, weakness (RUE), light-headedness, numbness and headaches.  Psychiatric/Behavioral: Negative for agitation, behavioral problems and confusion.    Vitals:   12/30/19 1604  Weight: 130 lb 6.4 oz (59.1 kg)   Body mass index is 24.64 kg/m. Physical Exam Vitals and nursing note reviewed.  Constitutional:      General: She is not in acute distress.    Appearance: She is not diaphoretic.  HENT:     Head: Normocephalic and atraumatic.     Nose: Nose normal.  Eyes:     Conjunctiva/sclera: Conjunctivae normal.     Pupils: Pupils are equal, round, and reactive to light.  Neck:     Vascular: No JVD.  Cardiovascular:     Rate and Rhythm: Normal rate and regular rhythm.     Heart sounds: No murmur heard.   Pulmonary:     Effort: Pulmonary effort is normal. No respiratory distress.     Breath sounds: Normal breath sounds. No wheezing.  Abdominal:     General: Bowel sounds are normal. There is no distension.     Palpations: Abdomen is soft.     Tenderness: There is no abdominal  tenderness.  Musculoskeletal:        General: No swelling, tenderness, deformity or signs of injury.     Right lower leg: No edema.     Left lower leg: No edema.     Comments: Pain with ROM to right shoulder   Skin:    General: Skin is warm and dry.  Neurological:     Mental Status: She is alert.     Comments: Oriented but difficult to assess fully due to expressive aphasia.  Psychiatric:        Mood and Affect: Mood normal.     Labs reviewed: Basic Metabolic Panel: Recent Labs    11/27/19 0553 11/28/19 0504 11/29/19 0441  NA 139 140 138  K 3.4* 4.1 3.8  CL 109 114* 108  CO2 19* 18* 17*  GLUCOSE 104* 93 99  BUN 49* 29* 27*  CREATININE 1.62* 1.25* 1.20*  CALCIUM 7.8* 8.0* 7.9*  MG 2.0 1.8 2.1  PHOS 2.2* 1.7* 2.9   Liver Function Tests: Recent Labs    11/27/19 0553 11/28/19 0504 11/29/19 0441  AST 16 14* 16  ALT 10 10 11   ALKPHOS 103 95 97  BILITOT 0.4 0.5 0.4  PROT 5.8*  5.4* 5.4*  ALBUMIN 2.6* 2.4* 2.3*   No results for input(s): LIPASE, AMYLASE in the last 8760 hours. No results for input(s): AMMONIA in the last 8760 hours. CBC: Recent Labs    11/27/19 0553 11/28/19 0504 11/29/19 0441  WBC 10.2 7.1 7.3  NEUTROABS 8.2* 5.7 5.6  HGB 9.0* 8.5* 9.2*  HCT 28.2* 26.9* 28.4*  MCV 92.5 93.1 92.8  PLT 217 206 257   Cardiac Enzymes: Recent Labs    11/22/19 1844  CKTOTAL 32*   BNP: Invalid input(s): POCBNP CBG: No results for input(s): GLUCAP in the last 8760 hours.  Procedures and Imaging Studies During Stay: CUP PACEART REMOTE DEVICE CHECK  Result Date: 12/17/2019 ILR summary report received. Battery status OK. Normal device function. No new symptom, tachy, brady, or pause episodes. No new AF episodes. Monthly summary reports and ROV/PRN   Assessment/Plan:   1. Closed fracture of right scapula, unspecified part of scapula, sequela Resolving with continue chronic shoulder pain which she is using icy hot and zanaflex. Will leave ultram on the [place] for now and discontinue at a later time.  May discharge back to [facility]  2. Closed fracture of ramus of left pubis, sequela Improved pain and mobility  Would like tylenol reduced to bid   3. Compression fracture of L1 vertebra, sequela Resolved with no current pain   4. Essential hypertension Controlled with no issues with low readings.  Continue Hydralazine twice a day and Coreg 6.25 mg in the am and 3.125 mg in the pm  5. Chronic diastolic congestive heart failure ([place]) Appears compensated.   6. Persistent atrial fibrillation ([facility]) Rate is controlled with current regimen Not currently on anticoag due to fall risk   7. Expressive aphasia Due to prior CVA  8. Anemia Lab Results  Component Value Date   HGB 9.2 (L) 11/29/2019  Due to fall with several fractures but has underlying anemia likely due to CKD Continue to monitor in f/u with Dr. Mariea Clonts    Patient is being discharged with the  following home health services:  Moving back to [facility] and is on PT and OT case load.   Patient is being discharged with the following durable medical equipment:   Already has walker  Future labs/tests needed: CBC

## 2019-12-31 DIAGNOSIS — I69151 Hemiplegia and hemiparesis following nontraumatic intracerebral hemorrhage affecting right dominant side: Secondary | ICD-10-CM | POA: Diagnosis not present

## 2019-12-31 DIAGNOSIS — R278 Other lack of coordination: Secondary | ICD-10-CM | POA: Diagnosis not present

## 2019-12-31 DIAGNOSIS — M6389 Disorders of muscle in diseases classified elsewhere, multiple sites: Secondary | ICD-10-CM | POA: Diagnosis not present

## 2019-12-31 DIAGNOSIS — R2689 Other abnormalities of gait and mobility: Secondary | ICD-10-CM | POA: Diagnosis not present

## 2019-12-31 DIAGNOSIS — G9341 Metabolic encephalopathy: Secondary | ICD-10-CM | POA: Diagnosis not present

## 2019-12-31 DIAGNOSIS — S42191S Fracture of other part of scapula, right shoulder, sequela: Secondary | ICD-10-CM | POA: Diagnosis not present

## 2020-01-03 DIAGNOSIS — R278 Other lack of coordination: Secondary | ICD-10-CM | POA: Diagnosis not present

## 2020-01-03 DIAGNOSIS — I69151 Hemiplegia and hemiparesis following nontraumatic intracerebral hemorrhage affecting right dominant side: Secondary | ICD-10-CM | POA: Diagnosis not present

## 2020-01-03 DIAGNOSIS — R2689 Other abnormalities of gait and mobility: Secondary | ICD-10-CM | POA: Diagnosis not present

## 2020-01-03 DIAGNOSIS — M6389 Disorders of muscle in diseases classified elsewhere, multiple sites: Secondary | ICD-10-CM | POA: Diagnosis not present

## 2020-01-03 DIAGNOSIS — G9341 Metabolic encephalopathy: Secondary | ICD-10-CM | POA: Diagnosis not present

## 2020-01-03 DIAGNOSIS — S42191S Fracture of other part of scapula, right shoulder, sequela: Secondary | ICD-10-CM | POA: Diagnosis not present

## 2020-01-05 ENCOUNTER — Other Ambulatory Visit: Payer: Self-pay

## 2020-01-05 DIAGNOSIS — S42191S Fracture of other part of scapula, right shoulder, sequela: Secondary | ICD-10-CM | POA: Diagnosis not present

## 2020-01-05 DIAGNOSIS — G9341 Metabolic encephalopathy: Secondary | ICD-10-CM | POA: Diagnosis not present

## 2020-01-05 DIAGNOSIS — I69151 Hemiplegia and hemiparesis following nontraumatic intracerebral hemorrhage affecting right dominant side: Secondary | ICD-10-CM | POA: Diagnosis not present

## 2020-01-05 DIAGNOSIS — R2689 Other abnormalities of gait and mobility: Secondary | ICD-10-CM | POA: Diagnosis not present

## 2020-01-05 DIAGNOSIS — R278 Other lack of coordination: Secondary | ICD-10-CM | POA: Diagnosis not present

## 2020-01-05 DIAGNOSIS — M6389 Disorders of muscle in diseases classified elsewhere, multiple sites: Secondary | ICD-10-CM | POA: Diagnosis not present

## 2020-01-05 MED ORDER — TRAMADOL HCL 50 MG PO TABS
50.0000 mg | ORAL_TABLET | Freq: Four times a day (QID) | ORAL | 0 refills | Status: DC | PRN
Start: 2020-01-05 — End: 2020-04-22

## 2020-01-10 DIAGNOSIS — R278 Other lack of coordination: Secondary | ICD-10-CM | POA: Diagnosis not present

## 2020-01-10 DIAGNOSIS — I69151 Hemiplegia and hemiparesis following nontraumatic intracerebral hemorrhage affecting right dominant side: Secondary | ICD-10-CM | POA: Diagnosis not present

## 2020-01-10 DIAGNOSIS — S42191S Fracture of other part of scapula, right shoulder, sequela: Secondary | ICD-10-CM | POA: Diagnosis not present

## 2020-01-10 DIAGNOSIS — M6389 Disorders of muscle in diseases classified elsewhere, multiple sites: Secondary | ICD-10-CM | POA: Diagnosis not present

## 2020-01-10 DIAGNOSIS — G9341 Metabolic encephalopathy: Secondary | ICD-10-CM | POA: Diagnosis not present

## 2020-01-10 DIAGNOSIS — R2689 Other abnormalities of gait and mobility: Secondary | ICD-10-CM | POA: Diagnosis not present

## 2020-01-13 DIAGNOSIS — I4819 Other persistent atrial fibrillation: Secondary | ICD-10-CM | POA: Diagnosis not present

## 2020-01-13 DIAGNOSIS — S42191S Fracture of other part of scapula, right shoulder, sequela: Secondary | ICD-10-CM | POA: Diagnosis not present

## 2020-01-13 DIAGNOSIS — S3282XS Multiple fractures of pelvis without disruption of pelvic ring, sequela: Secondary | ICD-10-CM | POA: Diagnosis not present

## 2020-01-13 DIAGNOSIS — G9341 Metabolic encephalopathy: Secondary | ICD-10-CM | POA: Diagnosis not present

## 2020-01-13 DIAGNOSIS — R278 Other lack of coordination: Secondary | ICD-10-CM | POA: Diagnosis not present

## 2020-01-13 DIAGNOSIS — M6389 Disorders of muscle in diseases classified elsewhere, multiple sites: Secondary | ICD-10-CM | POA: Diagnosis not present

## 2020-01-17 DIAGNOSIS — M6389 Disorders of muscle in diseases classified elsewhere, multiple sites: Secondary | ICD-10-CM | POA: Diagnosis not present

## 2020-01-17 DIAGNOSIS — S42191S Fracture of other part of scapula, right shoulder, sequela: Secondary | ICD-10-CM | POA: Diagnosis not present

## 2020-01-17 DIAGNOSIS — I4819 Other persistent atrial fibrillation: Secondary | ICD-10-CM | POA: Diagnosis not present

## 2020-01-17 DIAGNOSIS — G9341 Metabolic encephalopathy: Secondary | ICD-10-CM | POA: Diagnosis not present

## 2020-01-17 DIAGNOSIS — R278 Other lack of coordination: Secondary | ICD-10-CM | POA: Diagnosis not present

## 2020-01-17 DIAGNOSIS — S3282XS Multiple fractures of pelvis without disruption of pelvic ring, sequela: Secondary | ICD-10-CM | POA: Diagnosis not present

## 2020-01-18 DIAGNOSIS — M79671 Pain in right foot: Secondary | ICD-10-CM | POA: Diagnosis not present

## 2020-01-18 DIAGNOSIS — B351 Tinea unguium: Secondary | ICD-10-CM | POA: Diagnosis not present

## 2020-01-18 DIAGNOSIS — M79672 Pain in left foot: Secondary | ICD-10-CM | POA: Diagnosis not present

## 2020-01-23 LAB — CUP PACEART REMOTE DEVICE CHECK
Date Time Interrogation Session: 20211204223508
Implantable Pulse Generator Implant Date: 20190903

## 2020-01-24 ENCOUNTER — Ambulatory Visit (INDEPENDENT_AMBULATORY_CARE_PROVIDER_SITE_OTHER): Payer: Medicare Other

## 2020-01-24 DIAGNOSIS — I639 Cerebral infarction, unspecified: Secondary | ICD-10-CM | POA: Diagnosis not present

## 2020-01-26 DIAGNOSIS — Z85828 Personal history of other malignant neoplasm of skin: Secondary | ICD-10-CM | POA: Diagnosis not present

## 2020-01-26 DIAGNOSIS — L814 Other melanin hyperpigmentation: Secondary | ICD-10-CM | POA: Diagnosis not present

## 2020-01-26 DIAGNOSIS — L570 Actinic keratosis: Secondary | ICD-10-CM | POA: Diagnosis not present

## 2020-02-01 DIAGNOSIS — M6389 Disorders of muscle in diseases classified elsewhere, multiple sites: Secondary | ICD-10-CM | POA: Diagnosis not present

## 2020-02-01 DIAGNOSIS — S3282XS Multiple fractures of pelvis without disruption of pelvic ring, sequela: Secondary | ICD-10-CM | POA: Diagnosis not present

## 2020-02-01 DIAGNOSIS — I4819 Other persistent atrial fibrillation: Secondary | ICD-10-CM | POA: Diagnosis not present

## 2020-02-01 DIAGNOSIS — S42191S Fracture of other part of scapula, right shoulder, sequela: Secondary | ICD-10-CM | POA: Diagnosis not present

## 2020-02-01 DIAGNOSIS — G9341 Metabolic encephalopathy: Secondary | ICD-10-CM | POA: Diagnosis not present

## 2020-02-01 DIAGNOSIS — R278 Other lack of coordination: Secondary | ICD-10-CM | POA: Diagnosis not present

## 2020-02-08 NOTE — Progress Notes (Signed)
Carelink Summary Report / Loop Recorder 

## 2020-02-28 ENCOUNTER — Ambulatory Visit (INDEPENDENT_AMBULATORY_CARE_PROVIDER_SITE_OTHER): Payer: Medicare Other

## 2020-02-28 DIAGNOSIS — I639 Cerebral infarction, unspecified: Secondary | ICD-10-CM

## 2020-03-01 DIAGNOSIS — Z85828 Personal history of other malignant neoplasm of skin: Secondary | ICD-10-CM | POA: Diagnosis not present

## 2020-03-01 DIAGNOSIS — L814 Other melanin hyperpigmentation: Secondary | ICD-10-CM | POA: Diagnosis not present

## 2020-03-01 DIAGNOSIS — L570 Actinic keratosis: Secondary | ICD-10-CM | POA: Diagnosis not present

## 2020-03-01 LAB — CUP PACEART REMOTE DEVICE CHECK
Date Time Interrogation Session: 20220116000838
Implantable Pulse Generator Implant Date: 20190903

## 2020-03-13 NOTE — Progress Notes (Signed)
Carelink Summary Report / Loop Recorder 

## 2020-03-28 DIAGNOSIS — M79672 Pain in left foot: Secondary | ICD-10-CM | POA: Diagnosis not present

## 2020-03-28 DIAGNOSIS — M79671 Pain in right foot: Secondary | ICD-10-CM | POA: Diagnosis not present

## 2020-03-28 DIAGNOSIS — B351 Tinea unguium: Secondary | ICD-10-CM | POA: Diagnosis not present

## 2020-03-29 DIAGNOSIS — L814 Other melanin hyperpigmentation: Secondary | ICD-10-CM | POA: Diagnosis not present

## 2020-03-29 DIAGNOSIS — Z85828 Personal history of other malignant neoplasm of skin: Secondary | ICD-10-CM | POA: Diagnosis not present

## 2020-03-29 DIAGNOSIS — L570 Actinic keratosis: Secondary | ICD-10-CM | POA: Diagnosis not present

## 2020-03-31 LAB — CUP PACEART REMOTE DEVICE CHECK
Date Time Interrogation Session: 20220218002911
Implantable Pulse Generator Implant Date: 20190903

## 2020-04-03 ENCOUNTER — Encounter: Payer: Self-pay | Admitting: Internal Medicine

## 2020-04-03 ENCOUNTER — Ambulatory Visit (INDEPENDENT_AMBULATORY_CARE_PROVIDER_SITE_OTHER): Payer: Medicare Other

## 2020-04-03 DIAGNOSIS — I639 Cerebral infarction, unspecified: Secondary | ICD-10-CM

## 2020-04-06 NOTE — Progress Notes (Signed)
Carelink Summary Report / Loop Recorder 

## 2020-04-19 ENCOUNTER — Encounter: Payer: Self-pay | Admitting: Internal Medicine

## 2020-04-19 ENCOUNTER — Other Ambulatory Visit: Payer: Self-pay

## 2020-04-19 ENCOUNTER — Non-Acute Institutional Stay: Payer: Medicare Other | Admitting: Internal Medicine

## 2020-04-19 VITALS — BP 134/82 | HR 66 | Temp 98.1°F | Ht 61.0 in | Wt 140.4 lb

## 2020-04-19 DIAGNOSIS — S32010S Wedge compression fracture of first lumbar vertebra, sequela: Secondary | ICD-10-CM | POA: Diagnosis not present

## 2020-04-19 DIAGNOSIS — I639 Cerebral infarction, unspecified: Secondary | ICD-10-CM

## 2020-04-19 DIAGNOSIS — S32592S Other specified fracture of left pubis, sequela: Secondary | ICD-10-CM

## 2020-04-19 DIAGNOSIS — S42101S Fracture of unspecified part of scapula, right shoulder, sequela: Secondary | ICD-10-CM

## 2020-04-19 DIAGNOSIS — R4701 Aphasia: Secondary | ICD-10-CM

## 2020-04-19 DIAGNOSIS — I952 Hypotension due to drugs: Secondary | ICD-10-CM | POA: Diagnosis not present

## 2020-04-19 DIAGNOSIS — I4819 Other persistent atrial fibrillation: Secondary | ICD-10-CM

## 2020-04-19 DIAGNOSIS — I5032 Chronic diastolic (congestive) heart failure: Secondary | ICD-10-CM

## 2020-04-19 NOTE — Progress Notes (Signed)
Location:  Occupational psychologist of Service:  Clinic (12)  Provider: Niam Nepomuceno L. Mariea Clonts, D.O., C.M.D.  Code Status: DNR, MOST reviewed again today Goals of Care:  Advanced Directives 11/22/2019  Does Patient Have a Medical Advance Directive? Yes  Type of Advance Directive Out of facility DNR (pink MOST or yellow form)  Does patient want to make changes to medical advance directive? No - Patient declined  Copy of [place] in Chart? -  Would patient like information on creating a medical advance directive? -  Pre-existing out of facility DNR order (yellow form or pink MOST form) -     Chief Complaint  Patient presents with  . Medical Management of Chronic Issues    Medical Management of Chronic Issues. 6 Month follow up    HPI: Patient is a 87 y.o. female seen today for medical management of chronic diseases.  Here with her son, [name].  No new concerns.  Nearly back to baseline after fall with shoulder fx, pelvic fxs and compression fx. [name] is in enhanced AL since her stroke with aphasia.  She is a retired Marine scientist.  Right shoulder pain.  Stable with current regimen.  No c/o groin or leg pain or back pain.  No changes with sleep.  No bowel changes either.    BP good today.  Elevating legs in am due to lows.    Has her cardiology appt end of this month--Dr Allred re: afib.    Past Medical History:  Diagnosis Date  . Abdominal mass   . AKI (acute kidney injury) ([place]) 09/15/2014  . Atrial fibrillation ([place])   . Colitis, ulcerative ([place])   . Cough    WHITE SPUTUM FOR 2 WEEKS, NO FEVER  . Degenerative joint disease   . Diverticulosis   . DVT (deep venous thrombosis) ([place]) 2009   BOTH LUNGS  . GERD (gastroesophageal reflux disease)   . Hyperlipidemia   . Hyperplastic colon polyp   . Hypertension   . Hypothyroidism   . Iron deficiency anemia    ON IRON AT TIMES  . PE (pulmonary embolism) 2009  . Persistent atrial fibrillation ([place])   .  Positive H. pylori test 01/20/96  . S/P dilatation of esophageal stricture 1992  . Skin cancer    h/o basal and squamous skin cancer--> removed  . Stroke [facility])     Past Surgical History:  Procedure Laterality Date  . APPENDECTOMY    . BREAST EXCISIONAL BIOPSY Left 1992   benign  . BREAST LUMPECTOMY     benign  . DILATION AND CURETTAGE OF UTERUS     for endometrial polyps  . ENDOSCOPIC RETROGRADE CHOLANGIOPANCREATOGRAPHY (ERCP) WITH PROPOFOL N/A 03/23/2015   Procedure: ENDOSCOPIC RETROGRADE CHOLANGIOPANCREATOGRAPHY (ERCP) WITH PROPOFOL;  Surgeon: Milus Banister, MD;  Location: [facility];  Service: Endoscopy;  Laterality: N/A;  . JOINT REPLACEMENT    . LOOP RECORDER INSERTION N/A 10/14/2017   Procedure: LOOP RECORDER INSERTION;  Surgeon: Thompson Grayer, MD;  Location: [place] [facility];  Service: Cardiovascular;  Laterality: N/A;  . ORIF ANKLE FRACTURE Left    X 2  . REPLACEMENT TOTAL KNEE     left  . SURGERY DONE TO REMOVE LEFT ANKLE HARDWARE LATER    . TONSILLECTOMY      Allergies  Allergen Reactions  . Iohexol Hives  . Ivp Dye [Iodinated Diagnostic Agents] Hives    Was able to tolerate Contrast Media for CT scan.  [name] [  Shellfish Allergy] Nausea And Vomiting    Projectile vomiting  . Shellfish-Derived Products Nausea And Vomiting  . Sulfa Antibiotics Hives    Outpatient Encounter Medications as of 04/19/2020  Medication Sig  . acetaminophen (TYLENOL) 325 MG tablet Take 2 tablets (650 mg total) by mouth every 6 (six) hours. (Patient taking differently: Take 650 mg by mouth in the morning and at bedtime.)  . amiodarone (PACERONE) 100 MG tablet Take 1 tablet (100 mg total) by mouth daily.  Marland Kitchen aspirin EC 81 MG EC tablet Take 1 tablet (81 mg total) by mouth daily. Swallow whole.  Marland Kitchen atorvastatin (LIPITOR) 10 MG tablet Take 10 mg by mouth daily.  . Ca Phosphate-Cholecalciferol (CALTRATE GUMMY BITES PO) Take 2 tablets by mouth 2 (two) times daily.   . carvedilol (COREG)  6.25 MG tablet Take 1 tablet (6.25 mg total) by mouth in the morning AND 0.5 tablets (3.125 mg total) every evening.  . hydrALAZINE (APRESOLINE) 10 MG tablet Take 1 tablet (10 mg total) by mouth in the morning and at bedtime.  . hydroxypropyl methylcellulose / hypromellose (GONIOVISC) 2.5 % ophthalmic solution Place 1 drop into both eyes 3 (three) times daily as needed for dry eyes.   Marland Kitchen ipratropium-albuterol (DUONEB) 0.5-2.5 (3) MG/3ML SOLN Take 3 mLs by nebulization every 6 (six) hours as needed (SOB/Wheezing /cough).   . lactose free nutrition (BOOST PLUS) LIQD Take 237 mLs by mouth daily.  Marland Kitchen levothyroxine (SYNTHROID) 75 MCG tablet Take 1 tablet (75 mcg total) by mouth daily before breakfast.  . melatonin 10 MG TABS Take 10 mg by mouth at bedtime as needed (sleep).  . Menthol-Methyl Salicylate (ICY HOT EXTRA STRENGTH) 10-30 % CREA Apply 1 application topically 2 (two) times daily. Right shoulder  . ondansetron (ZOFRAN) 4 MG tablet Take 4 mg by mouth every 6 (six) hours as needed for nausea or vomiting.  . pantoprazole (PROTONIX) 40 MG tablet Take 40 mg by mouth daily.  Marland Kitchen senna (SENOKOT) 8.6 MG TABS tablet Take 1 tablet (8.6 mg total) by mouth daily as needed for mild constipation.  . Simethicone (GAS RELIEF PO) Take 1 tablet by mouth 2 (two) times daily.  Marland Kitchen tiZANidine (ZANAFLEX) 2 MG tablet Take 1 tablet (2 mg total) by mouth 4 (four) times daily -  with meals and at bedtime.  . traMADol (ULTRAM) 50 MG tablet Take 1 tablet (50 mg total) by mouth every 6 (six) hours as needed for moderate pain. (Patient taking differently: Take 50 mg by mouth every morning.)  . WITCH HAZEL EX Apply 1 application topically 2 (two) times daily as needed (for comfort). Witch hazel pads   No facility-administered encounter medications on file as of 04/19/2020.    Review of Systems:  Review of Systems  Constitutional: Negative for chills, fever and malaise/fatigue.  HENT: Negative for congestion and sore throat.    Eyes: Negative for blurred vision.  Respiratory: Negative for cough and shortness of breath.   Cardiovascular: Negative for chest pain, palpitations and leg swelling.  Gastrointestinal: Negative for abdominal pain and constipation.  Genitourinary: Negative for dysuria and hematuria.  Musculoskeletal: Positive for joint pain. Negative for back pain and falls.  Skin: Negative for itching and rash.  Neurological: Negative for dizziness and loss of consciousness.       Aphasic s/p stroke  Endo/Heme/Allergies: Bruises/bleeds easily.  Psychiatric/Behavioral: Positive for memory loss. Negative for depression. The patient is not nervous/anxious and does not have insomnia.     Health Maintenance  Topic Date  Due  . TETANUS/TDAP  04/14/2018  . COVID-19 Vaccine (3 - Moderna risk 4-dose series) 04/21/2019  . INFLUENZA VACCINE  Completed  . DEXA SCAN  Completed  . PNA vac Low Risk Adult  Completed  . HPV VACCINES  Aged Out    Physical Exam: Vitals:   04/19/20 1338  BP: 134/82  Pulse: 66  Temp: 98.1 F (36.7 C)  TempSrc: Skin  SpO2: 97%  Weight: 140 lb 6.4 oz (63.7 kg)  Height: 5' 1"  (1.549 m)   Body mass index is 26.53 kg/m. Physical Exam Vitals and nursing note reviewed.  Constitutional:      General: She is not in acute distress.    Appearance: Normal appearance. She is not toxic-appearing.     Comments: Her son brought her in a transport wheelchair today; uses walker on unit  HENT:     Head: Normocephalic and atraumatic.  Cardiovascular:     Rate and Rhythm: Rhythm irregular.     Heart sounds: No murmur heard.   Pulmonary:     Effort: Pulmonary effort is normal.     Breath sounds: Normal breath sounds. No rales.  Abdominal:     General: Bowel sounds are normal.     Palpations: Abdomen is soft.     Tenderness: There is no abdominal tenderness.  Musculoskeletal:     Right lower leg: No edema.     Left lower leg: No edema.  Skin:    General: Skin is warm and dry.      Coloration: Skin is pale.  Neurological:     Mental Status: She is alert.     Gait: Gait abnormal.     Comments: Aphasic--will speak, but responses are not always consistent with what she wants to stay; her son joins her to help with visits;  Chronic decreased ROM of right shoulder and pain  Psychiatric:        Mood and Affect: Mood normal.     Labs reviewed: Basic Metabolic Panel: Recent Labs    05/07/19 0937 07/13/19 0000 10/21/19 0000 11/27/19 0553 11/28/19 0504 11/29/19 0441  NA  --   --    < > 139 140 138  K  --   --    < > 3.4* 4.1 3.8  CL  --   --    < > 109 114* 108  CO2  --   --    < > 19* 18* 17*  GLUCOSE  --   --    < > 104* 93 99  BUN  --   --    < > 49* 29* 27*  CREATININE  --   --    < > 1.62* 1.25* 1.20*  CALCIUM  --   --    < > 7.8* 8.0* 7.9*  MG  --   --    < > 2.0 1.8 2.1  PHOS  --   --    < > 2.2* 1.7* 2.9  TSH 0.208* 0.94  --   --   --   --    < > = values in this interval not displayed.   Liver Function Tests: Recent Labs    11/27/19 0553 11/28/19 0504 11/29/19 0441  AST 16 14* 16  ALT 10 10 11   ALKPHOS 103 95 97  BILITOT 0.4 0.5 0.4  PROT 5.8* 5.4* 5.4*  ALBUMIN 2.6* 2.4* 2.3*   No results for input(s): LIPASE, AMYLASE in the last 8760 hours. No results for input(s):  AMMONIA in the last 8760 hours. CBC: Recent Labs    11/27/19 0553 11/28/19 0504 11/29/19 0441  WBC 10.2 7.1 7.3  NEUTROABS 8.2* 5.7 5.6  HGB 9.0* 8.5* 9.2*  HCT 28.2* 26.9* 28.4*  MCV 92.5 93.1 92.8  PLT 217 206 257   Lipid Panel: Recent Labs    04/28/19 0000 04/28/19 0600 11/27/19 0553  CHOL 137 137 92  HDL 41 41 30*  LDLCALC 74 74 46  TRIG 110 110 82  CHOLHDL  --   --  3.1   Lab Results  Component Value Date   HGBA1C 5.3 08/02/2013    Procedures since last visit: CUP PACEART REMOTE DEVICE CHECK  Result Date: 03/31/2020 ILR summary report received. Battery status OK. Normal device function. No new symptom, tachy, brady, or pause episodes. No new AF  episodes. Monthly summary reports and ROV/PRN. HB   Assessment/Plan 1. Closed fracture of right scapula, unspecified part of scapula, sequela -had pain even before fx in right shoulder  -this area still bothers her at times -cont bid tylenol and q am tramadol  2. Closed fracture of ramus of left pubis, sequela -no longer painful -ambulates with walker on AL and uses transport chair long distances  3. Compression fracture of L1 vertebra, sequela -no longer painful  4. Hypotension due to drugs -bp still running low at times especially am and staff are elevating her feet to address -previously had rapid afib when coreg held -if bps remain low, may need to reduce am dose to 3.139m also  5. Chronic diastolic congestive heart failure (HCC) -cont current regimen and monitor -euvolemic  6. Persistent atrial fibrillation (HCC) -continues on amiodarone 1071mdaily., coreg low dose due to hypotension  7. Expressive aphasia -affects communication considerably, best for her son to join for appts -some memory loss, but mostly the problem is her aphasia  Labs/tests ordered:  No new Next appt:  6 [place], [place]. [facility] [address] [address] Phone (Mon-Fri 8am-5pm):  [phone] Call:  [phone] follow prompts after 5pm & weekends Office Phone:  [phone] Fax:  [phone]

## 2020-04-20 DIAGNOSIS — I4821 Permanent atrial fibrillation: Secondary | ICD-10-CM | POA: Diagnosis not present

## 2020-04-20 LAB — HEPATIC FUNCTION PANEL
ALT: 11 (ref 7–35)
AST: 17 (ref 13–35)
Alkaline Phosphatase: 114 (ref 25–125)
Bilirubin, Total: 0.3

## 2020-04-20 LAB — BASIC METABOLIC PANEL
BUN: 44 — AB (ref 4–21)
CO2: 25 — AB (ref 13–22)
Chloride: 98 — AB (ref 99–108)
Creatinine: 1.6 — AB (ref 0.5–1.1)
Glucose: 105
Potassium: 3.7 (ref 3.4–5.3)
Sodium: 135 — AB (ref 137–147)

## 2020-04-20 LAB — COMPREHENSIVE METABOLIC PANEL: Calcium: 9.6 (ref 8.7–10.7)

## 2020-04-22 MED ORDER — TRAMADOL HCL 50 MG PO TABS
50.0000 mg | ORAL_TABLET | Freq: Every morning | ORAL | 0 refills | Status: DC
Start: 2020-04-22 — End: 2020-04-24

## 2020-04-22 MED ORDER — ACETAMINOPHEN 325 MG PO TABS: 650.0000 mg | ORAL_TABLET | Freq: Two times a day (BID) | ORAL | 0 refills | Status: AC

## 2020-04-24 ENCOUNTER — Other Ambulatory Visit: Payer: Self-pay | Admitting: Adult Health

## 2020-04-24 MED ORDER — TRAMADOL HCL 50 MG PO TABS
50.0000 mg | ORAL_TABLET | Freq: Every morning | ORAL | 2 refills | Status: DC
Start: 2020-04-24 — End: 2020-04-27

## 2020-04-26 DIAGNOSIS — L814 Other melanin hyperpigmentation: Secondary | ICD-10-CM | POA: Diagnosis not present

## 2020-04-26 DIAGNOSIS — D692 Other nonthrombocytopenic purpura: Secondary | ICD-10-CM | POA: Diagnosis not present

## 2020-04-26 DIAGNOSIS — L570 Actinic keratosis: Secondary | ICD-10-CM | POA: Diagnosis not present

## 2020-04-26 DIAGNOSIS — Z85828 Personal history of other malignant neoplasm of skin: Secondary | ICD-10-CM | POA: Diagnosis not present

## 2020-04-27 ENCOUNTER — Other Ambulatory Visit: Payer: Self-pay | Admitting: Adult Health

## 2020-04-27 MED ORDER — TRAMADOL HCL 50 MG PO TABS
50.0000 mg | ORAL_TABLET | Freq: Every morning | ORAL | 2 refills | Status: DC
Start: 2020-04-27 — End: 2020-05-23

## 2020-05-07 LAB — CUP PACEART REMOTE DEVICE CHECK
Date Time Interrogation Session: 20220323012757
Implantable Pulse Generator Implant Date: 20190903

## 2020-05-08 ENCOUNTER — Other Ambulatory Visit: Payer: Self-pay

## 2020-05-08 ENCOUNTER — Encounter: Payer: Self-pay | Admitting: Internal Medicine

## 2020-05-08 ENCOUNTER — Ambulatory Visit (INDEPENDENT_AMBULATORY_CARE_PROVIDER_SITE_OTHER): Payer: Medicare Other | Admitting: Internal Medicine

## 2020-05-08 ENCOUNTER — Ambulatory Visit (INDEPENDENT_AMBULATORY_CARE_PROVIDER_SITE_OTHER): Payer: Medicare Other

## 2020-05-08 VITALS — BP 108/60 | HR 71 | Ht 61.0 in | Wt 138.0 lb

## 2020-05-08 DIAGNOSIS — I639 Cerebral infarction, unspecified: Secondary | ICD-10-CM

## 2020-05-08 DIAGNOSIS — I10 Essential (primary) hypertension: Secondary | ICD-10-CM

## 2020-05-08 DIAGNOSIS — I480 Paroxysmal atrial fibrillation: Secondary | ICD-10-CM

## 2020-05-08 NOTE — Progress Notes (Signed)
PCP: [name], DO   Primary EP: Dr Coral Ceo Sara Russell is a 87 y.o. female who presents today for routine electrophysiology followup.  Since last being seen in our clinic, the patient reports doing reasonably well.  She has expressive aphasia.  She had a fall in 10/21 with several fractures.  She has made slow recovery.  She lives at [employer] [facility].  She is accompanied by her son today who is caregiver.  Today, she denies symptoms of palpitations, chest pain, shortness of breath,  lower extremity edema, dizziness, presyncope, or syncope.  The patient is otherwise without complaint today.   Past Medical History:  Diagnosis Date  . Abdominal mass   . AKI (acute kidney injury) ([place]) 09/15/2014  . Atrial fibrillation ([place])   . Colitis, ulcerative ([place])   . Cough    WHITE SPUTUM FOR 2 WEEKS, NO FEVER  . Degenerative joint disease   . Diverticulosis   . DVT (deep venous thrombosis) ([place]) 2009   BOTH LUNGS  . GERD (gastroesophageal reflux disease)   . Hyperlipidemia   . Hyperplastic colon polyp   . Hypertension   . Hypothyroidism   . Iron deficiency anemia    ON IRON AT TIMES  . PE (pulmonary embolism) 2009  . Persistent atrial fibrillation ([place])   . Positive H. pylori test 01/20/96  . S/P dilatation of esophageal stricture 1992  . Skin cancer    h/o basal and squamous skin cancer--> removed  . Stroke [facility])    Past Surgical History:  Procedure Laterality Date  . APPENDECTOMY    . BREAST EXCISIONAL BIOPSY Left 1992   benign  . BREAST LUMPECTOMY     benign  . DILATION AND CURETTAGE OF UTERUS     for endometrial polyps  . ENDOSCOPIC RETROGRADE CHOLANGIOPANCREATOGRAPHY (ERCP) WITH PROPOFOL N/A 03/23/2015   Procedure: ENDOSCOPIC RETROGRADE CHOLANGIOPANCREATOGRAPHY (ERCP) WITH PROPOFOL;  Surgeon: Milus Banister, MD;  [facility];  Service: Endoscopy;  Laterality: N/A;  . JOINT REPLACEMENT    . LOOP RECORDER INSERTION N/A 10/14/2017   Procedure: LOOP RECORDER  INSERTION;  Surgeon: Thompson Grayer, MD;  Location: [facility] LAB;  Service: Cardiovascular;  Laterality: N/A;  . ORIF ANKLE FRACTURE Left    X 2  . REPLACEMENT TOTAL KNEE     left  . SURGERY DONE TO REMOVE LEFT ANKLE HARDWARE LATER    . TONSILLECTOMY      ROS- all systems are reviewed and negatives except as per HPI above  Current Outpatient Medications  Medication Sig Dispense Refill  . acetaminophen (TYLENOL) 325 MG tablet Take 2 tablets (650 mg total) by mouth in the morning and at bedtime. 60 tablet 0  . amiodarone (PACERONE) 100 MG tablet Take 1 tablet (100 mg total) by mouth daily. 90 tablet 3  . aspirin EC 81 MG EC tablet Take 1 tablet (81 mg total) by mouth daily. Swallow whole. 30 tablet 0  . atorvastatin (LIPITOR) 10 MG tablet Take 10 mg by mouth daily.    . Ca Phosphate-Cholecalciferol (CALTRATE GUMMY BITES PO) Take 2 tablets by mouth 2 (two) times daily.     . carvedilol (COREG) 6.25 MG tablet Take 1 tablet (6.25 mg total) by mouth in the morning AND 0.5 tablets (3.125 mg total) every evening. 60 tablet 3  . hydrALAZINE (APRESOLINE) 10 MG tablet Take 1 tablet (10 mg total) by mouth in the morning and at bedtime. 60 tablet 3  . hydroxypropyl methylcellulose / hypromellose (GONIOVISC)  2.5 % ophthalmic solution Place 1 drop into both eyes 3 (three) times daily as needed for dry eyes.     Marland Kitchen ipratropium-albuterol (DUONEB) 0.5-2.5 (3) MG/3ML SOLN Take 3 mLs by nebulization every 6 (six) hours as needed (SOB/Wheezing /cough).     . lactose free nutrition (BOOST PLUS) LIQD Take 237 mLs by mouth daily.    Marland Kitchen levothyroxine (SYNTHROID) 75 MCG tablet Take 1 tablet (75 mcg total) by mouth daily before breakfast. 30 tablet 5  . melatonin 10 MG TABS Take 10 mg by mouth at bedtime as needed (sleep). 30 tablet 0  . Menthol-Methyl Salicylate (ICY HOT EXTRA STRENGTH) 10-30 % CREA Apply 1 application topically 2 (two) times daily. Right shoulder    . ondansetron (ZOFRAN) 4 MG tablet Take 4 mg  by mouth every 6 (six) hours as needed for nausea or vomiting.    . pantoprazole (PROTONIX) 40 MG tablet Take 40 mg by mouth daily.    Marland Kitchen senna (SENOKOT) 8.6 MG TABS tablet Take 1 tablet (8.6 mg total) by mouth daily as needed for mild constipation. 30 tablet 0  . Simethicone (GAS RELIEF PO) Take 1 tablet by mouth 2 (two) times daily.    Marland Kitchen tiZANidine (ZANAFLEX) 2 MG tablet Take 1 tablet (2 mg total) by mouth 4 (four) times daily -  with meals and at bedtime. 30 tablet 0  . traMADol (ULTRAM) 50 MG tablet Take 1 tablet (50 mg total) by mouth every morning. 30 tablet 2  . WITCH HAZEL EX Apply 1 application topically 2 (two) times daily as needed (for comfort). Witch hazel pads     No current facility-administered medications for this visit.    Physical Exam: Vitals:   05/08/20 1423  BP: 108/60  Pulse: 71  SpO2: 96%  Weight: 138 lb (62.6 kg)  Height: 5' 1"  (1.549 m)    GEN- The patient is elderly appearing, alert  Head- normocephalic, atraumatic Eyes-  Sclera clear, conjunctiva pink Ears- hearing intact Oropharynx- clear Lungs- Clear to ausculation bilaterally, normal work of breathing Heart- Regular rate and rhythm, no murmurs, rubs or gallops, PMI not laterally displaced GI- soft, NT, ND, + BS Extremities- no clubbing, cyanosis, or edema  Wt Readings from Last 3 Encounters:  05/08/20 138 lb (62.6 kg)  04/19/20 140 lb 6.4 oz (63.7 kg)  12/30/19 130 lb 6.4 oz (59.1 kg)    EKG tracing ordered today is personally reviewed and shows sinus rhythm  Assessment and Plan:  1/ afib Well controlled (0% burden by ILR) Would avoid OAC given prior ICH She is doing well with amiodarone 138m daily  We will follow her closely on this medicine to avoid toxicity  I will request that [facility] check cmet, tsh twice per year.  2. HTN Stable No change required today  3. HL Continue lipitor  Risks, benefits and potential toxicities for medications prescribed and/or refilled reviewed  with patient today.   Return to see EP PA in a year  JThompson GrayerMD, FGrove Hill Memorial Hospital3/28/2022 3:09 PM

## 2020-05-08 NOTE — Patient Instructions (Addendum)
Medication Instructions:  Your physician recommends that you continue on your current medications as directed. Please refer to the Current Medication list given to you today.  Labwork: Requesting CMET and TSH every 6 months at [facility].   Testing/Procedures: None ordered.  Follow-Up: Your physician wants you to follow-up in: one year with Thompson Grayer, MD    Tommye Standard, PA-C     You will receive a reminder letter in the mail two months in advance. If you don't receive a letter, please call our office to schedule the follow-up appointment.   Any Other Special Instructions Will Be Listed Below (If Applicable).  If you need a refill on your cardiac medications before your next appointment, please call your pharmacy.

## 2020-05-22 NOTE — Progress Notes (Signed)
Carelink Summary Report / Loop Recorder 

## 2020-05-23 ENCOUNTER — Other Ambulatory Visit: Payer: Self-pay | Admitting: Orthopedic Surgery

## 2020-05-23 DIAGNOSIS — M25611 Stiffness of right shoulder, not elsewhere classified: Secondary | ICD-10-CM

## 2020-05-23 MED ORDER — TRAMADOL HCL 50 MG PO TABS: 50.0000 mg | ORAL_TABLET | Freq: Every morning | ORAL | 2 refills | Status: DC

## 2020-06-05 ENCOUNTER — Ambulatory Visit (INDEPENDENT_AMBULATORY_CARE_PROVIDER_SITE_OTHER): Payer: Medicare Other

## 2020-06-05 DIAGNOSIS — I639 Cerebral infarction, unspecified: Secondary | ICD-10-CM

## 2020-06-06 DIAGNOSIS — B351 Tinea unguium: Secondary | ICD-10-CM | POA: Diagnosis not present

## 2020-06-06 DIAGNOSIS — M79672 Pain in left foot: Secondary | ICD-10-CM | POA: Diagnosis not present

## 2020-06-06 DIAGNOSIS — M79671 Pain in right foot: Secondary | ICD-10-CM | POA: Diagnosis not present

## 2020-06-06 LAB — CUP PACEART REMOTE DEVICE CHECK
Date Time Interrogation Session: 20220425013106
Implantable Pulse Generator Implant Date: 20190903

## 2020-06-21 DIAGNOSIS — D692 Other nonthrombocytopenic purpura: Secondary | ICD-10-CM | POA: Diagnosis not present

## 2020-06-21 DIAGNOSIS — L570 Actinic keratosis: Secondary | ICD-10-CM | POA: Diagnosis not present

## 2020-06-21 DIAGNOSIS — L814 Other melanin hyperpigmentation: Secondary | ICD-10-CM | POA: Diagnosis not present

## 2020-06-21 DIAGNOSIS — Z85828 Personal history of other malignant neoplasm of skin: Secondary | ICD-10-CM | POA: Diagnosis not present

## 2020-06-21 DIAGNOSIS — L821 Other seborrheic keratosis: Secondary | ICD-10-CM | POA: Diagnosis not present

## 2020-06-22 NOTE — Progress Notes (Signed)
Carelink Summary Report / Loop Recorder 

## 2020-07-04 ENCOUNTER — Encounter: Payer: Self-pay | Admitting: Orthopedic Surgery

## 2020-07-04 ENCOUNTER — Non-Acute Institutional Stay: Payer: Medicare Other | Admitting: Orthopedic Surgery

## 2020-07-04 DIAGNOSIS — M25531 Pain in right wrist: Secondary | ICD-10-CM

## 2020-07-04 DIAGNOSIS — M25611 Stiffness of right shoulder, not elsewhere classified: Secondary | ICD-10-CM

## 2020-07-04 NOTE — Progress Notes (Signed)
Location:  [place] Room Number: 024-O Place of Service:  ALF [phone]) Provider:  Windell Moulding, NP   Patient Care Team: Virgie Dad, MD as PCP - General (Internal Medicine) Benard Rink (Dentistry) [place], [place], [place] (Optometry) Sydnee Levans, MD (Dermatology) Thompson Grayer, MD as Consulting Physician (Cardiology) [name], DO as Consulting Physician (Geriatric Medicine)  Extended Emergency Contact Information Primary Emergency Contact: Mcclaskey,Jim Address: [address]          [place], [place] [address] Johnnette Litter of [place] Phone: [phone] Mobile Phone: [phone] Relation: Son Secondary Emergency Contact: [name] [place] of [place] Phone: [phone] Mobile Phone: [phone] Relation: Son  Code Status:  DNR  Goals of care: Advanced Directive information Advanced Directives 07/04/2020  Does Patient Have a Medical Advance Directive? Yes  Type of Paramedic of [place];Living will;Out of facility DNR (pink MOST or yellow form)  Does patient want to make changes to medical advance directive? No - Patient declined  Copy of [place] in Chart? Yes - validated most recent copy scanned in chart (See row information)  Would patient like information on creating a medical advance directive? -  Pre-existing out of facility DNR order (yellow form or pink MOST form) Yellow form placed in chart (order not valid for inpatient use);Pink MOST form placed in chart (order not valid for inpatient use)     Chief Complaint  Patient presents with  . Acute Visit    Right wrist swelling     HPI:  Pt is a 87 y.o. female seen today for an acute visit for right wrist swelling and right shoulder pain.   Nursing staff reports patient c/o right wrist pain for the past few days. In addition, her chronic right shoulder pain has increased since holding Icy/Hot cream due to  skin irritation.   At the beginning of our encounter, she is verbally frustrated with nursing staff and my presence. She reports a long history of chronic right arm pain. She is unable to lift her right arm past her chest. Reports pain as constant, stimulated with movement and rated 4/10. She is asking for tramadol to be scheduled twice daily for chronic pain.   Nurse does not report any other concerns, vitals stable.    Past Medical History:  Diagnosis Date  . Abdominal mass   . AKI (acute kidney injury) ([place]) 09/15/2014  . Atrial fibrillation ([place])   . Colitis, ulcerative ([place])   . Cough    WHITE SPUTUM FOR 2 WEEKS, NO FEVER  . Degenerative joint disease   . Diverticulosis   . DVT (deep venous thrombosis) ([place]) 2009   BOTH LUNGS  . GERD (gastroesophageal reflux disease)   . Hyperlipidemia   . Hyperplastic colon polyp   . Hypertension   . Hypothyroidism   . Iron deficiency anemia    ON IRON AT TIMES  . PE (pulmonary embolism) 2009  . Persistent atrial fibrillation ([place])   . Positive H. pylori test 01/20/96  . S/P dilatation of esophageal stricture 1992  . Skin cancer    h/o basal and squamous skin cancer--> removed  . Stroke [facility])    Past Surgical History:  Procedure Laterality Date  . APPENDECTOMY    . BREAST EXCISIONAL BIOPSY Left 1992   benign  . BREAST LUMPECTOMY     benign  . DILATION AND CURETTAGE OF UTERUS     for endometrial polyps  . ENDOSCOPIC RETROGRADE CHOLANGIOPANCREATOGRAPHY (ERCP)  WITH PROPOFOL N/A 03/23/2015   Procedure: ENDOSCOPIC RETROGRADE CHOLANGIOPANCREATOGRAPHY (ERCP) WITH PROPOFOL;  Surgeon: Milus Banister, MD;  Location: [facility];  Service: Endoscopy;  Laterality: N/A;  . JOINT REPLACEMENT    . LOOP RECORDER INSERTION N/A 10/14/2017   Procedure: LOOP RECORDER INSERTION;  Surgeon: Thompson Grayer, MD;  Location: [facility] [facility];  Service: Cardiovascular;  Laterality: N/A;  . ORIF ANKLE FRACTURE Left    X 2  . REPLACEMENT TOTAL KNEE     left   . SURGERY DONE TO REMOVE LEFT ANKLE HARDWARE LATER    . TONSILLECTOMY      Allergies  Allergen Reactions  . Iohexol Hives  . Ivp Dye [Iodinated Diagnostic Agents] Hives    Was able to tolerate Contrast Media for CT scan.  [name] [Shellfish Allergy] Nausea And Vomiting    Projectile vomiting  . Shellfish-Derived Products Nausea And Vomiting  . Sulfa Antibiotics Hives    Outpatient Encounter Medications as of 07/04/2020  Medication Sig  . acetaminophen (TYLENOL) 325 MG tablet Take 2 tablets (650 mg total) by mouth in the morning and at bedtime.  [place] Kitchen amiodarone (PACERONE) 100 MG tablet Take 1 tablet (100 mg total) by mouth daily.  Marland Kitchen aspirin EC 81 MG EC tablet Take 1 tablet (81 mg total) by mouth daily. Swallow whole.  Marland Kitchen atorvastatin (LIPITOR) 10 MG tablet Take 10 mg by mouth daily.  . Ca Phosphate-Cholecalciferol (CALTRATE GUMMY BITES PO) Take 2 tablets by mouth 2 (two) times daily.   . carvedilol (COREG) 6.25 MG tablet Take 1 tablet (6.25 mg total) by mouth in the morning AND 0.5 tablets (3.125 mg total) every evening.  . hydrALAZINE (APRESOLINE) 10 MG tablet Take 1 tablet (10 mg total) by mouth in the morning and at bedtime.  . hydroxypropyl methylcellulose / hypromellose (GONIOVISC) 2.5 % ophthalmic solution Place 1 drop into both eyes every 8 (eight) hours as needed for dry eyes.  Marland Kitchen ipratropium-albuterol (DUONEB) 0.5-2.5 (3) MG/3ML SOLN Take 3 mLs by nebulization every 6 (six) hours as needed (SOB/Wheezing /cough).   . lactose free nutrition (BOOST PLUS) LIQD Take 237 mLs by mouth daily.  Marland Kitchen levothyroxine (SYNTHROID) 75 MCG tablet Take 1 tablet (75 mcg total) by mouth daily before breakfast.  . melatonin 10 MG TABS Take 10 mg by mouth at bedtime as needed (sleep).  . Menthol-Methyl Salicylate (ICY HOT EXTRA STRENGTH) 10-30 % CREA Apply 1 application topically 2 (two) times daily. Right shoulder  . ondansetron (ZOFRAN) 4 MG tablet Take 4 mg by mouth every 6 (six) hours as needed for  nausea or vomiting.  . pantoprazole (PROTONIX) 40 MG tablet Take 40 mg by mouth daily.  Marland Kitchen senna (SENOKOT) 8.6 MG TABS tablet Take 1 tablet (8.6 mg total) by mouth daily as needed for mild constipation.  Marland Kitchen tiZANidine (ZANAFLEX) 2 MG tablet Take 1 tablet (2 mg total) by mouth 4 (four) times daily -  with meals and at bedtime.  . traMADol (ULTRAM) 50 MG tablet Take 1 tablet (50 mg total) by mouth every morning.  . traMADol (ULTRAM) 50 MG tablet Take by mouth every 6 (six) hours as needed for moderate pain. Along with scheduled dose, see other listing  . WITCH HAZEL EX Apply 1 application topically 2 (two) times daily as needed (for comfort). Witch hazel pads  . Simethicone (GAS RELIEF PO) Take 1 tablet by mouth 2 (two) times daily.   No facility-administered encounter medications on file as of 07/04/2020.    Review  of Systems  Constitutional: Negative for activity change, appetite change, chills, fatigue and fever.  Respiratory: Negative for cough, shortness of breath and wheezing.   Cardiovascular: Negative for chest pain and leg swelling.  Musculoskeletal: Positive for arthralgias and myalgias.       Right shoulder and wrist pain  Skin: Positive for rash.  Psychiatric/Behavioral: Negative for dysphoric mood. The patient is not nervous/anxious.     Immunization History  Administered Date(s) Administered  . H1N1 03/04/2008  . Influenza Split 11/12/2010, 11/26/2011  . Influenza Whole 11/26/2006, 11/03/2007, 12/10/2007, 11/16/2008, 11/20/2009  . Influenza, High Dose Seasonal PF 12/10/2018, 12/03/2019  . Influenza,inj,Quad PF,6+ Mos 11/24/2012, 11/29/2013, 10/14/2014, 11/28/2015, 12/04/2016, 12/11/2017  . Influenza-Unspecified 12/06/2019  . Moderna Sars-Covid-2 Vaccination 02/20/2019, 03/24/2019  . PPD Test 10/03/2014  . Pneumococcal Conjugate-13 09/10/2013  . Pneumococcal Polysaccharide-23 02/12/2000  . Td 02/11/1998, 04/13/2008  . Zoster 05/08/2006   Pertinent  Health Maintenance Due   Topic Date Due  . INFLUENZA VACCINE  09/11/2020  . DEXA SCAN  Completed  . PNA vac Low Risk Adult  Completed   Fall Risk  12/24/2019 10/20/2019 04/14/2019 09/16/2018 03/18/2018  Falls in the past year? - 0 0 0 0  Number falls in past yr: - 0 0 0 0  Comment - - - - -  Injury with Fall? - 0 0 0 0  Risk for fall due to : History of fall(s);Impaired balance/gait;Impaired mobility;Mental status change;Orthopedic patient History of fall(s);Impaired balance/gait;Impaired mobility;Mental status change - - -  Follow up Falls evaluation completed;Education provided;Falls prevention discussed Falls evaluation completed;Education provided;Falls prevention discussed - - -   Functional Status Survey:    Vitals:   07/04/20 1005  BP: (!) 156/79  Pulse: 74  Resp: 20  Temp: (!) 97.3 F (36.3 C)  SpO2: 96%  Weight: 134 lb (60.8 kg)  Height: 5' 1"  (1.549 m)   Body mass index is 25.32 kg/m. Physical Exam Vitals reviewed.  Constitutional:      General: She is not in acute distress. HENT:     Head: Normocephalic.  Cardiovascular:     Rate and Rhythm: Normal rate. Rhythm irregular.     Pulses: Normal pulses.     Heart sounds: Normal heart sounds. No murmur heard.   Pulmonary:     Effort: Pulmonary effort is normal.     Breath sounds: Normal breath sounds. No wheezing.  Musculoskeletal:     Comments: Right shoulder with positive drop arm test. Pain noted with flexion/extension, adduction and abduction. No erythema, warmth or effusion noted.   Right wrist with some mild swelling. Pain noted with flexion and extension. No erythema or warmth noted.   Skin:    General: Skin is warm and dry.     Capillary Refill: Capillary refill takes less than 2 seconds.     Comments: Unable to examine skin over right shoulder- patient refused  Neurological:     General: No focal deficit present.     Mental Status: She is alert and oriented to person, place, and time.  Psychiatric:        Mood and Affect: Mood  normal.        Behavior: Behavior normal.     Labs reviewed: Recent Labs    11/27/19 0553 11/28/19 0504 11/29/19 0441 04/20/20 0000  NA 139 140 138 135*  K 3.4* 4.1 3.8 3.7  CL 109 114* 108 98*  CO2 19* 18* 17* 25*  GLUCOSE 104* 93 99  --   BUN 49*  29* 27* 44*  CREATININE 1.62* 1.25* 1.20* 1.6*  CALCIUM 7.8* 8.0* 7.9* 9.6  MG 2.0 1.8 2.1  --   PHOS 2.2* 1.7* 2.9  --    Recent Labs    11/27/19 0553 11/28/19 0504 11/29/19 0441 04/20/20 0000  AST 16 14* 16 17  ALT 10 10 11 11   ALKPHOS 103 95 97 114  BILITOT 0.4 0.5 0.4  --   PROT 5.8* 5.4* 5.4*  --   ALBUMIN 2.6* 2.4* 2.3*  --    Recent Labs    11/27/19 0553 11/28/19 0504 11/29/19 0441  WBC 10.2 7.1 7.3  NEUTROABS 8.2* 5.7 5.6  HGB 9.0* 8.5* 9.2*  HCT 28.2* 26.9* 28.4*  MCV 92.5 93.1 92.8  PLT 217 206 257   Lab Results  Component Value Date   TSH 0.94 07/13/2019   Lab Results  Component Value Date   HGBA1C 5.3 08/02/2013   Lab Results  Component Value Date   CHOL 92 11/27/2019   HDL 30 (L) 11/27/2019   LDLCALC 46 11/27/2019   LDLDIRECT 85 08/18/2008   TRIG 82 11/27/2019   CHOLHDL 3.1 11/27/2019    Significant Diagnostic Results in last 30 days:  CUP PACEART REMOTE DEVICE CHECK  Result Date: 06/06/2020 ILR summary report received. Battery status OK. Normal device function. No new symptom, tachy, brady, or pause episodes. No new AF episodes. Monthly summary reports and ROV/PRN.  R. Powers, CVRS   Assessment/Plan 1. Shoulder stiffness, right - 11/2020 fracture to right scapula - followed by Dr. Veverly Fells - limited mobility to right shoulder/ positive drop arm- suspect impingement or rotator cuff invomvement - will increase tramadol 50 mg po bid - suggest discussing pain with palliative care if it continues to persist  2. Right wrist pain - pain noted with flexion and extension - no warmth or erythema - will increase Tramadol 50 mg po bid   Family/ staff Communication: plan discussed with  patient and nurse  Labs/tests ordered: none

## 2020-07-10 LAB — CUP PACEART REMOTE DEVICE CHECK
Date Time Interrogation Session: 20220528013516
Implantable Pulse Generator Implant Date: 20190903

## 2020-07-11 ENCOUNTER — Ambulatory Visit (INDEPENDENT_AMBULATORY_CARE_PROVIDER_SITE_OTHER): Payer: Medicare Other

## 2020-07-11 DIAGNOSIS — I639 Cerebral infarction, unspecified: Secondary | ICD-10-CM

## 2020-07-19 ENCOUNTER — Encounter: Payer: Self-pay | Admitting: Internal Medicine

## 2020-07-19 DIAGNOSIS — L570 Actinic keratosis: Secondary | ICD-10-CM | POA: Diagnosis not present

## 2020-07-19 DIAGNOSIS — L853 Xerosis cutis: Secondary | ICD-10-CM | POA: Diagnosis not present

## 2020-07-19 DIAGNOSIS — Z20828 Contact with and (suspected) exposure to other viral communicable diseases: Secondary | ICD-10-CM | POA: Diagnosis not present

## 2020-07-19 DIAGNOSIS — L309 Dermatitis, unspecified: Secondary | ICD-10-CM | POA: Diagnosis not present

## 2020-07-19 DIAGNOSIS — Z9189 Other specified personal risk factors, not elsewhere classified: Secondary | ICD-10-CM | POA: Diagnosis not present

## 2020-07-20 ENCOUNTER — Encounter: Payer: Self-pay | Admitting: Adult Health

## 2020-07-20 ENCOUNTER — Non-Acute Institutional Stay: Payer: Medicare Other | Admitting: Adult Health

## 2020-07-20 DIAGNOSIS — R531 Weakness: Secondary | ICD-10-CM | POA: Diagnosis not present

## 2020-07-20 DIAGNOSIS — R509 Fever, unspecified: Secondary | ICD-10-CM | POA: Diagnosis not present

## 2020-07-20 LAB — BASIC METABOLIC PANEL
BUN: 65 — AB (ref 4–21)
CO2: 24 — AB (ref 13–22)
Chloride: 104 (ref 99–108)
Creatinine: 2.4 — AB (ref 0.5–1.1)
Glucose: 132
Potassium: 4.1 (ref 3.4–5.3)
Sodium: 138 (ref 137–147)

## 2020-07-20 LAB — CBC: RBC: 3.17 — AB (ref 3.87–5.11)

## 2020-07-20 LAB — CBC AND DIFFERENTIAL
HCT: 28 — AB (ref 36–46)
Hemoglobin: 9.4 — AB (ref 12.0–16.0)
Neutrophils Absolute: 11
Platelets: 185 (ref 150–399)
WBC: 11.7

## 2020-07-20 LAB — COMPREHENSIVE METABOLIC PANEL: Calcium: 8.7 (ref 8.7–10.7)

## 2020-07-20 NOTE — Progress Notes (Signed)
Location:   [facility] Room Number: 749 Place of Service:  ALF [phone]) Provider: Royal Hawthorn, NP  Virgie Dad, MD  Patient Care Team: Virgie Dad, MD as PCP - General (Internal Medicine) Benard Rink (Dentistry) [place], Heather, [place] (Optometry) Sydnee Levans, MD (Dermatology) Thompson Grayer, MD as Consulting Physician (Cardiology) [name], DO as Consulting Physician (Geriatric Medicine)  Extended Emergency Contact Information Primary Emergency Contact: Entsminger,Jim Address: [address]          [place], [place] [address] Johnnette Litter of [place] Phone: [phone] Mobile Phone: [phone] Relation: Son Secondary Emergency Contact: [name] [place] of [place] Phone: [phone] Mobile Phone: [phone] Relation: Son  Code Status:  DNR Goals of care: Advanced Directive information Advanced Directives 07/04/2020  Does Patient Have a Medical Advance Directive? Yes  Type of Paramedic of [place];Living will;Out of facility DNR (pink MOST or yellow form)  Does patient want to make changes to medical advance directive? No - Patient declined  Copy of [place] in Chart? Yes - validated most recent copy scanned in chart (See row information)  Would patient like information on creating a medical advance directive? -  Pre-existing out of facility DNR order (yellow form or pink MOST form) Yellow form placed in chart (order not valid for inpatient use);Pink MOST form placed in chart (order not valid for inpatient use)     Chief Complaint  Patient presents with   Acute Visit    Weakness and fever.     HPI:  Pt is a 87 y.o. female seen today for an acute visit for weakness and fever. The nurse reports she had a temp of 102.7.   I rechecked in the room and got 98.1.  Fever and weakness started 1 day ago. They do report concern that the thermometer is not  accurate. She has a slight cough but no sputum production. She is not sob and does not have oxygen requirements. PCR and rapid swab for covid were negative. She is weaker and refuses to ambulate. She has a hx of CVA with right sided weakness and expressive aphasia. The nurse reports she is weaker and needs for assistance and the weakness is worse on the right. She does not have any difficulty swallowing or facial droop. BP has been stable. She denies any urinary symptoms of dysuria, difficulty urinating or constipation. She tells me her name but can't tell me the date or location. Seems slightly more confused from baseline. She remains able to follow commands. Her right shoulder is painful with decreased ROM which is chronic but worsening lately. She has a hx of scapular fx. Most form indicates no hospitalizations or IVs. Ok for antibiotics.    Past Medical History:  Diagnosis Date   Abdominal mass    AKI (acute kidney injury) ([facility]) 09/15/2014   Atrial fibrillation (HCC)    Colitis, ulcerative (HCC)    Cough    WHITE SPUTUM FOR 2 WEEKS, NO FEVER   Degenerative joint disease    Diverticulosis    DVT (deep venous thrombosis) ([employer]) 2009   BOTH LUNGS   GERD (gastroesophageal reflux disease)    Hyperlipidemia    Hyperplastic colon polyp    Hypertension    Hypothyroidism    Iron deficiency anemia    ON IRON AT TIMES   PE (pulmonary embolism) 2009   Persistent atrial fibrillation (HCC)    Positive H. pylori test 01/20/96   S/P  dilatation of esophageal stricture 1992   Skin cancer    h/o basal and squamous skin cancer--> removed   Stroke [facility])    Past Surgical History:  Procedure Laterality Date   APPENDECTOMY     BREAST EXCISIONAL BIOPSY Left 1992   benign   BREAST LUMPECTOMY     benign   DILATION AND CURETTAGE OF UTERUS     for endometrial polyps   ENDOSCOPIC RETROGRADE CHOLANGIOPANCREATOGRAPHY (ERCP) WITH PROPOFOL N/A 03/23/2015   Procedure: ENDOSCOPIC RETROGRADE  CHOLANGIOPANCREATOGRAPHY (ERCP) WITH PROPOFOL;  Surgeon: Milus Banister, MD;  Location: [facility];  Service: Endoscopy;  Laterality: N/A;   JOINT REPLACEMENT     LOOP RECORDER INSERTION N/A 10/14/2017   Procedure: LOOP RECORDER INSERTION;  Surgeon: Thompson Grayer, MD;  Location: [facility];  Service: Cardiovascular;  Laterality: N/A;   ORIF ANKLE FRACTURE Left    X 2   REPLACEMENT TOTAL KNEE     left   SURGERY DONE TO REMOVE LEFT ANKLE HARDWARE LATER     TONSILLECTOMY      Allergies  Allergen Reactions   Iohexol Hives   Ivp Dye [Iodinated Diagnostic Agents] Hives    Was able to tolerate Contrast Media for CT scan.   Scallops [Shellfish Allergy] Nausea And Vomiting    Projectile vomiting   Shellfish-Derived Products Nausea And Vomiting   Sulfa Antibiotics Hives    Allergies as of 07/20/2020       Reactions   Iohexol Hives   Ivp Dye [iodinated Diagnostic Agents] Hives   Was able to tolerate Contrast Media for CT scan.   Scallops [shellfish Allergy] Nausea And Vomiting   Projectile vomiting   Shellfish-derived Products Nausea And Vomiting   Sulfa Antibiotics Hives        Medication List        Accurate as of July 20, 2020  4:37 PM. If you have any questions, ask your nurse or doctor.          acetaminophen 325 MG tablet Commonly known as: TYLENOL Take 2 tablets (650 mg total) by mouth in the morning and at bedtime.   amiodarone 100 MG tablet Commonly known as: PACERONE Take 1 tablet (100 mg total) by mouth daily.   aspirin 81 MG EC tablet Take 1 tablet (81 mg total) by mouth daily. Swallow whole.   atorvastatin 10 MG tablet Commonly known as: LIPITOR Take 10 mg by mouth daily.   CALTRATE GUMMY BITES PO Take 2 tablets by mouth 2 (two) times daily.   carvedilol 6.25 MG tablet Commonly known as: Coreg Take 1 tablet (6.25 mg total) by mouth in the morning AND 0.5 tablets (3.125 mg total) every evening.   GAS RELIEF PO Take 1 tablet by mouth 2 (two)  times daily.   Goniovisc 2.5 % ophthalmic solution Generic drug: hydroxypropyl methylcellulose / hypromellose Place 1 drop into both eyes every 8 (eight) hours as needed for dry eyes.   hydrALAZINE 10 MG tablet Commonly known as: APRESOLINE Take 1 tablet (10 mg total) by mouth in the morning and at bedtime.   Icy Hot Extra Strength 10-30 % Crea Apply 1 application topically 2 (two) times daily. Right shoulder   ipratropium-albuterol 0.5-2.5 (3) MG/3ML Soln Commonly known as: DUONEB Take 3 mLs by nebulization every 6 (six) hours as needed (SOB/Wheezing /cough).   lactose free nutrition Liqd Take 237 mLs by mouth daily.   levothyroxine 75 MCG tablet Commonly known as: SYNTHROID Take 1 tablet (75 mcg total) by  mouth daily before breakfast.   Melatonin 10 MG Tabs Take 10 mg by mouth at bedtime as needed (sleep).   ondansetron 4 MG tablet Commonly known as: ZOFRAN Take 4 mg by mouth every 6 (six) hours as needed for nausea or vomiting.   pantoprazole 40 MG tablet Commonly known as: PROTONIX Take 40 mg by mouth daily.   senna 8.6 MG Tabs tablet Commonly known as: SENOKOT Take 1 tablet (8.6 mg total) by mouth daily as needed for mild constipation.   tiZANidine 2 MG tablet Commonly known as: ZANAFLEX Take 1 tablet (2 mg total) by mouth 4 (four) times daily -  with meals and at bedtime.   traMADol 50 MG tablet Commonly known as: ULTRAM Take by mouth every 6 (six) hours as needed for moderate pain. Along with scheduled dose, see other listing   traMADol 50 MG tablet Commonly known as: ULTRAM Take 1 tablet (50 mg total) by mouth every morning.   triamcinolone cream 0.1 % Commonly known as: KENALOG Apply 1 application topically 2 (two) times daily. Apply thin layer to rash on back BID x 2 weeks, then PRN for itching. Twice A Day   WITCH HAZEL EX Apply 1 application topically 2 (two) times daily as needed (for comfort). Witch hazel pads        Review of Systems   Constitutional:  Positive for activity change and fever. Negative for appetite change, chills, diaphoresis, fatigue and unexpected weight change.  HENT:  Negative for congestion.   Respiratory:  Positive for cough. Negative for shortness of breath and wheezing.   Cardiovascular:  Negative for chest pain, palpitations and leg swelling.  Gastrointestinal:  Negative for abdominal distention, abdominal pain, constipation and diarrhea.  Genitourinary:  Negative for difficulty urinating and dysuria.  Musculoskeletal:  Positive for gait problem. Negative for arthralgias (right shoulder pain), back pain, joint swelling and myalgias.  Skin:  Negative for color change and pallor.  Neurological:  Positive for weakness. Negative for dizziness, tremors, seizures, syncope, facial asymmetry, speech difficulty (no change from baseline but does have expressive aphasia), light-headedness, numbness and headaches.  Psychiatric/Behavioral:  Negative for agitation, behavioral problems and confusion.    Immunization History  Administered Date(s) Administered   H1N1 03/04/2008   Influenza Split 11/12/2010, 11/26/2011   Influenza Whole 11/26/2006, 11/03/2007, 12/10/2007, 11/16/2008, 11/20/2009   Influenza, High Dose Seasonal PF 12/10/2018, 12/03/2019   Influenza,inj,Quad PF,6+ Mos 11/24/2012, 11/29/2013, 10/14/2014, 11/28/2015, 12/04/2016, 12/11/2017   Influenza-Unspecified 12/06/2019   Moderna Sars-Covid-2 Vaccination 02/20/2019, 03/24/2019   PPD Test 10/03/2014   Pneumococcal Conjugate-13 09/10/2013   Pneumococcal Polysaccharide-23 02/12/2000   Td 02/11/1998, 04/13/2008   Zoster, Live 05/08/2006   Pertinent  Health Maintenance Due  Topic Date Due   INFLUENZA VACCINE  09/11/2020   DEXA SCAN  Completed   PNA vac Low Risk Adult  Completed   Fall Risk  12/24/2019 10/20/2019 04/14/2019 09/16/2018 03/18/2018  Falls in the past year? - 0 0 0 0  Number falls in past yr: - 0 0 0 0  Comment - - - - -  Injury with Fall?  - 0 0 0 0  Risk for fall due to : History of fall(s);Impaired balance/gait;Impaired mobility;Mental status change;Orthopedic patient History of fall(s);Impaired balance/gait;Impaired mobility;Mental status change - - -  Follow up Falls evaluation completed;Education provided;Falls prevention discussed Falls evaluation completed;Education provided;Falls prevention discussed - - -   Functional Status Survey:    Vitals:   07/20/20 1608  BP: 126/76  Pulse: 94  Resp:  20  Temp: (!) 102.7 F (39.3 C)  SpO2: 94%  Weight: 137 lb (62.1 kg)  Height: 5' 1"  (1.549 m)   Body mass index is 25.89 kg/m. Physical Exam Vitals and nursing note reviewed.  Constitutional:      General: She is not in acute distress.    Appearance: She is not diaphoretic.  HENT:     Head: Normocephalic and atraumatic.     Nose: Nose normal.     Mouth/Throat:     Mouth: Mucous membranes are moist.     Pharynx: Oropharynx is clear.  Eyes:     Conjunctiva/sclera: Conjunctivae normal.     Pupils: Pupils are equal, round, and reactive to light.  Neck:     Vascular: No JVD.  Cardiovascular:     Rate and Rhythm: Normal rate and regular rhythm.     Heart sounds: No murmur heard. Pulmonary:     Effort: Pulmonary effort is normal. No respiratory distress.     Breath sounds: Wheezing present.  Abdominal:     General: Bowel sounds are normal. There is no distension.     Palpations: Abdomen is soft.     Tenderness: There is no abdominal tenderness. There is left CVA tenderness. There is no right CVA tenderness.  Musculoskeletal:     Right lower leg: No edema.     Left lower leg: No edema.  Skin:    General: Skin is warm and dry.  Neurological:     Mental Status: She is alert.     Comments: Oriented to self only. Able to f/c. No facial droop. Has pain with range of motion of the right shoulder which is not new. +radial pulse on the right. Adequate and equal grip strength bilaterally  Psychiatric:        Mood and  Affect: Mood normal.    Labs reviewed: Recent Labs    11/27/19 0553 11/28/19 0504 11/29/19 0441 04/20/20 0000  NA 139 140 138 135*  K 3.4* 4.1 3.8 3.7  CL 109 114* 108 98*  CO2 19* 18* 17* 25*  GLUCOSE 104* 93 99  --   BUN 49* 29* 27* 44*  CREATININE 1.62* 1.25* 1.20* 1.6*  CALCIUM 7.8* 8.0* 7.9* 9.6  MG 2.0 1.8 2.1  --   PHOS 2.2* 1.7* 2.9  --    Recent Labs    11/27/19 0553 11/28/19 0504 11/29/19 0441 04/20/20 0000  AST 16 14* 16 17  ALT 10 10 11 11   ALKPHOS 103 95 97 114  BILITOT 0.4 0.5 0.4  --   PROT 5.8* 5.4* 5.4*  --   ALBUMIN 2.6* 2.4* 2.3*  --    Recent Labs    11/27/19 0553 11/28/19 0504 11/29/19 0441  WBC 10.2 7.1 7.3  NEUTROABS 8.2* 5.7 5.6  HGB 9.0* 8.5* 9.2*  HCT 28.2* 26.9* 28.4*  MCV 92.5 93.1 92.8  PLT 217 206 257   Lab Results  Component Value Date   TSH 0.94 07/13/2019   Lab Results  Component Value Date   HGBA1C 5.3 08/02/2013   Lab Results  Component Value Date   CHOL 92 11/27/2019   HDL 30 (L) 11/27/2019   LDLCALC 46 11/27/2019   LDLDIRECT 85 08/18/2008   TRIG 82 11/27/2019   CHOLHDL 3.1 11/27/2019    Significant Diagnostic Results in last 30 days:  CUP PACEART REMOTE DEVICE CHECK  Result Date: 07/10/2020 ILR summary report received. Battery status OK. Normal device function. No new symptom, tachy, brady, or  pause episodes. No new AF episodes. Monthly summary reports and ROV/PRN Kathy Breach, RN, CCDS, [employer]   Assessment/Plan 1. Fever, unspecified fever cause Pt goals of care are comfort based but would like treatment for easily reversible issues.  Will take off isolation as rapid and pcr negative. Will check UA and CXR BMP and CBC  2. Weakness Due to acute illness. Does not appear to be having a stroke as she does not have a facial droop and has equal grip strength. She is confused and weak making it difficult to assess. Her most form indicates no hospitalizations. Will continue to monitor.

## 2020-07-21 ENCOUNTER — Telehealth: Payer: Self-pay | Admitting: Adult Health

## 2020-07-21 DIAGNOSIS — J181 Lobar pneumonia, unspecified organism: Secondary | ICD-10-CM

## 2020-07-21 DIAGNOSIS — R059 Cough, unspecified: Secondary | ICD-10-CM | POA: Diagnosis not present

## 2020-07-21 DIAGNOSIS — N179 Acute kidney failure, unspecified: Secondary | ICD-10-CM

## 2020-07-21 NOTE — Telephone Encounter (Signed)
CXR returned showing  right basilar pna. Pt has a cough and fever. Augmentin 875 prescribed for 7 days for possible asp pna.   She is not eating and drinking well and is weak BUN/Cr 65/2.35.   IVF ordered to run slowly NS 80cc/hr for 2 liters. Monitor resp status while IVF are going.   Pt and family agreed to the above plan and to move her to rehab for increase functional needs and monitoring.   Repeat BMP once IVF complete.

## 2020-07-23 DIAGNOSIS — J189 Pneumonia, unspecified organism: Secondary | ICD-10-CM | POA: Diagnosis not present

## 2020-07-23 LAB — BASIC METABOLIC PANEL
BUN: 39 — AB (ref 4–21)
CO2: 23 — AB (ref 13–22)
Chloride: 108 (ref 99–108)
Creatinine: 1.6 — AB (ref 0.5–1.1)
Glucose: 91
Potassium: 3.8 (ref 3.4–5.3)
Sodium: 139 (ref 137–147)

## 2020-07-23 LAB — COMPREHENSIVE METABOLIC PANEL: Calcium: 8.3 — AB (ref 8.7–10.7)

## 2020-07-24 ENCOUNTER — Non-Acute Institutional Stay (SKILLED_NURSING_FACILITY): Payer: Medicare Other | Admitting: Internal Medicine

## 2020-07-24 ENCOUNTER — Encounter: Payer: Self-pay | Admitting: Internal Medicine

## 2020-07-24 DIAGNOSIS — I4819 Other persistent atrial fibrillation: Secondary | ICD-10-CM

## 2020-07-24 DIAGNOSIS — R531 Weakness: Secondary | ICD-10-CM

## 2020-07-24 DIAGNOSIS — I10 Essential (primary) hypertension: Secondary | ICD-10-CM

## 2020-07-24 DIAGNOSIS — R4701 Aphasia: Secondary | ICD-10-CM

## 2020-07-24 DIAGNOSIS — J181 Lobar pneumonia, unspecified organism: Secondary | ICD-10-CM | POA: Diagnosis not present

## 2020-07-24 DIAGNOSIS — D649 Anemia, unspecified: Secondary | ICD-10-CM | POA: Diagnosis not present

## 2020-07-24 DIAGNOSIS — I639 Cerebral infarction, unspecified: Secondary | ICD-10-CM | POA: Diagnosis not present

## 2020-07-24 DIAGNOSIS — N179 Acute kidney failure, unspecified: Secondary | ICD-10-CM | POA: Diagnosis not present

## 2020-07-24 NOTE — Progress Notes (Signed)
Provider:  Veleta Miners MD Location:   [place] Room Number: 270 Place of Service:  SNF ([id number])  PCP: Virgie Dad, MD Patient Care Team: Virgie Dad, MD as PCP - General (Internal Medicine) Benard Rink (Dentistry) [place], [place], [place] (Optometry) Sydnee Levans, MD (Dermatology) Thompson Grayer, MD as Consulting Physician (Cardiology) [name], DO as Consulting Physician (Geriatric Medicine)  Extended Emergency Contact Information Primary Emergency Contact: [facility] Phone: [phone] Relation: Son  Code Status: DNR Goals of Care: Advanced Directive information Advanced Directives 07/24/2020  Does Patient Have a Medical Advance Directive? Yes  Type of Paramedic of [place];Living will;Out of facility DNR (pink MOST or yellow form)  Does patient want to make changes to medical advance directive? No - Patient declined  Copy of [place] in Chart? Yes - validated most recent copy scanned in chart (See row information)  Would patient like information on creating a medical advance directive? -  Pre-existing out of facility DNR order (yellow form or pink MOST form) Yellow form placed in chart (order not valid for inpatient use);Pink MOST form placed in chart (order not valid for inpatient use)      Chief Complaint  Patient presents with   New Admit To SNF    Admission to SNF    HPI: Patient is a 87 y.o. female seen today for admission to SNF for therapy  Patient has a history of aphasia expressive, chronic right arm weakness due to previous stroke Of the has a history of PAF not on any anticoagulation due to [place] History of microscopic colitis, hypothyroid, hyperlipidemia  [place]  Was admitted to rehab as she had an temp of 102.7 with cough.  Also noticed more confusion and worsening right-sided weakness but her daughter Further work-up showed  bibasilar pneumonia.  Was started on Augmentin She also had acute renal insufficiency with elevated BUN and creatinine She was given IV fluids 2 L.  I was unable to get much history from the patient because of her aphasia and she gets frustrated very easily. She did calm down when her daughter came  Patient is doing better does not have any more fever continues to have some cough. Per Nurses patient sometimes refuses her meds. Her daughter seems to be concerned that she possibly had a small stroke. The patient and daughter are very sure they do not want her to go to ED for any further testing  Past Medical History:  Diagnosis Date   Abdominal mass    AKI (acute kidney injury) ([place]) 09/15/2014   Atrial fibrillation ([place])    Colitis, ulcerative ([place])    Cough    WHITE SPUTUM FOR 2 WEEKS, NO FEVER   Degenerative joint disease    Diverticulosis    DVT (deep venous thrombosis) ([place]) 2009   BOTH LUNGS   GERD (gastroesophageal reflux disease)    Hyperlipidemia    Hyperplastic colon polyp    Hypertension    Hypothyroidism    Iron deficiency anemia    ON IRON AT TIMES   PE (pulmonary embolism) 2009   Persistent atrial fibrillation (H[facility])    Positive H. pylori test 01/20/96   S/P dilatation of esophageal stricture 1992   Skin cancer    h/o basal and squamous skin cancer--> removed   Stroke [facility])    Past Surgical History:  Procedure Laterality Date   APPENDECTOMY     BREAST EXCISIONAL BIOPSY Left 1992  benign   BREAST LUMPECTOMY     benign   DILATION AND CURETTAGE OF UTERUS     for endometrial polyps   ENDOSCOPIC RETROGRADE CHOLANGIOPANCREATOGRAPHY (ERCP) WITH PROPOFOL N/A 03/23/2015   Procedure: ENDOSCOPIC RETROGRADE CHOLANGIOPANCREATOGRAPHY (ERCP) WITH PROPOFOL;  Surgeon: Milus Banister, MD;  Location: [facility] ENDOSCOPY;  Service: Endoscopy;  Laterality: N/A;   JOINT REPLACEMENT     LOOP RECORDER INSERTION N/A 10/14/2017   Procedure: LOOP RECORDER INSERTION;  Surgeon: Thompson Grayer, MD;  Location: [place] [facility];  Service: Cardiovascular;  Laterality: N/A;   ORIF ANKLE FRACTURE Left    X 2   REPLACEMENT TOTAL KNEE     left   SURGERY DONE TO REMOVE LEFT ANKLE HARDWARE LATER     TONSILLECTOMY      reports that she quit smoking about 59 years ago. Her smoking use included cigarettes. She started smoking about 72 years ago. She has a 6.50 pack-year smoking history. She has never used smokeless tobacco. She reports current alcohol use of about 5.0 standard drinks of alcohol per week. She reports that she does not use drugs. Social History   Socioeconomic History   Marital status: Widowed    Spouse name: Not on file   Number of children: 3   Years of education: college   Highest education level: Bachelor's degree (e.g., BA, AB, BS)  Occupational History   Occupation: retired Optician, dispensing: RETIRED  Tobacco Use   Smoking status: Former    Packs/day: 0.50    Years: 13.00    Pack years: 6.50    Types: Cigarettes    Start date: 02/12/1948    Quit date: 02/11/1961    Years since quitting: 59.4   Smokeless tobacco: Never   Tobacco comments:    Denies use of tobacco since 1963  Vaping Use   Vaping Use: Never used  Substance and Sexual Activity   Alcohol use: Yes    Alcohol/week: 5.0 standard drinks    Types: 5 Glasses of wine per week    Comment: WINE FEW TIMES PER WEEK   Drug use: No   Sexual activity: Never  Other Topics Concern   Not on file  Social History Narrative   Health Care POA:    Emergency Contact: son, Anesia Blackwell (c) [phone] [name])   Diet: Pt has a varied diet of protein, starch, vegetables.   Exercise: Pt has no regular exercise plan because of pain in back.   Seatbelts: Pt reports wearing seatbelt when in vehicle.   Nancy Fetter Exposure/Protection: Pt reports wearing sun screen.   Hobbies: reading, traveling, eating out.      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 [place], RN, BSN                                                                                                    Social Determinants of Health   Financial Resource Strain: Not on file  Food Insecurity: Not on file  Transportation Needs: Not on file  Physical Activity: Not on file  Stress: Not on file  Social Connections: Not on file  Intimate Partner Violence: Not on file    Functional Status Survey:    Family History  Problem Relation Age of Onset   Esophageal cancer Brother    Diverticulitis Brother    Heart disease Mother    Stroke Mother    Dementia Mother    Heart attack Mother    Heart disease Father    Atrial fibrillation Father    Breast cancer Maternal Aunt    Colon cancer Neg Hx     Health Maintenance  Topic Date Due   Zoster Vaccines- Shingrix (1 of 2) Never done   TETANUS/TDAP  04/14/2018   COVID-19 Vaccine (4 - Booster for Moderna series) 03/24/2020   INFLUENZA VACCINE  09/11/2020   DEXA SCAN  Completed   PNA vac Low Risk Adult  Completed   HPV VACCINES  Aged Out    Allergies  Allergen Reactions   Iohexol Hives   Ivp Dye [Iodinated Diagnostic Agents] Hives     Was able to tolerate Contrast Media for CT scan.   Scallops [Shellfish Allergy] Nausea And Vomiting    Projectile vomiting   Shellfish-Derived Products Nausea And Vomiting   Sulfa Antibiotics Hives    Allergies as of 07/24/2020       Reactions   Iohexol Hives   Ivp Dye [iodinated Diagnostic Agents] Hives   Was able to tolerate Contrast Media for CT scan.   Scallops [shellfish Allergy] Nausea And Vomiting   Projectile vomiting   Shellfish-derived Products Nausea And Vomiting   Sulfa Antibiotics Hives        Medication List        Accurate as of July 24, 2020 10:06 AM. If you have any questions, ask your nurse or doctor.          STOP taking these medications    Icy Hot Extra Strength 10-30 % Crea Stopped by: Virgie Dad, MD       TAKE these medications    acetaminophen 325 MG tablet Commonly known as: TYLENOL Take 2 tablets (650 mg total) by mouth in the morning and at bedtime.   acetaminophen 325 MG tablet Commonly known as: TYLENOL Take 650 mg by mouth every 4 (four) hours as needed.   amiodarone 100 MG tablet Commonly known as: PACERONE Take 1 tablet (100 mg total) by mouth daily.   amoxicillin-clavulanate 875-125 MG tablet Commonly known as: AUGMENTIN Take 1 tablet by mouth 2 (two) times daily. X 7 days for pna   aspirin 81 MG EC tablet  Take 1 tablet (81 mg total) by mouth daily. Swallow whole.   atorvastatin 10 MG tablet Commonly known as: LIPITOR Take 10 mg by mouth daily.   CALTRATE GUMMY BITES PO Take 2 tablets by mouth 2 (two) times daily.   carvedilol 6.25 MG tablet Commonly known as: Coreg Take 1 tablet (6.25 mg total) by mouth in the morning AND 0.5 tablets (3.125 mg total) every evening.   GAS RELIEF PO Take 1 tablet by mouth 2 (two) times daily.   Goniovisc 2.5 % ophthalmic solution Generic drug: hydroxypropyl methylcellulose / hypromellose Place 1 drop into both eyes every 8 (eight) hours as needed for dry eyes.    hydrALAZINE 10 MG tablet Commonly known as: APRESOLINE Take 1 tablet (10 mg total) by mouth in the morning and at bedtime.   ipratropium-albuterol 0.5-2.5 (3) MG/3ML Soln Commonly known as: DUONEB Take 3 mLs by nebulization every 6 (six) hours as needed (SOB/Wheezing /cough).   lactose free nutrition Liqd Take 237 mLs by mouth daily.   levothyroxine 75 MCG tablet Commonly known as: SYNTHROID Take 1 tablet (75 mcg total) by mouth daily before breakfast.   Melatonin 10 MG Tabs Take 10 mg by mouth at bedtime as needed (sleep).   ondansetron 4 MG tablet Commonly known as: ZOFRAN Take 4 mg by mouth every 6 (six) hours as needed for nausea or vomiting.   pantoprazole 40 MG tablet Commonly known as: PROTONIX Take 40 mg by mouth daily.   senna 8.6 MG Tabs tablet Commonly known as: SENOKOT Take 1 tablet (8.6 mg total) by mouth daily as needed for mild constipation.   tiZANidine 2 MG tablet Commonly known as: ZANAFLEX Take 1 tablet (2 mg total) by mouth 4 (four) times daily -  with meals and at bedtime.   traMADol 50 MG tablet Commonly known as: ULTRAM Take 50 mg by mouth 2 (two) times daily. What changed: Another medication with the same name was removed. Continue taking this medication, and follow the directions you see here. Changed by: Virgie Dad, MD   triamcinolone cream 0.1 % Commonly known as: KENALOG Apply 1 application topically 2 (two) times daily. Apply thin layer to rash on back BID x 2 weeks, then PRN for itching. Twice A Day   WITCH HAZEL EX Apply 1 application topically 2 (two) times daily as needed (for comfort). Witch hazel pads        Review of Systems  Unable to perform ROS: Other  Gets Frustrated if ask her Questions  Vitals:   07/24/20 0949  BP: 129/69  Pulse: 62  Resp: 18  Temp: 97.7 F (36.5 C)  SpO2: 97%  Weight: 135 lb 3.2 oz (61.3 kg)  Height: 5' 1"  (1.549 m)   Body mass index is 25.55 kg/m. Physical Exam Vitals reviewed.   Constitutional:      Appearance: Normal appearance.  HENT:     Head: Normocephalic.     Nose: Nose normal.     Mouth/Throat:     Mouth: Mucous membranes are moist.     Pharynx: Oropharynx is clear.  Eyes:     Pupils: Pupils are equal, round, and reactive to light.  Cardiovascular:     Rate and Rhythm: Normal rate. Rhythm irregular.     Pulses: Normal pulses.  Pulmonary:     Effort: Pulmonary effort is normal.     Comments: Some Rales in Right Lower side Abdominal:     General: Abdomen is flat. Bowel sounds are normal.  Palpations: Abdomen is soft.  Musculoskeletal:        General: No swelling.     Cervical back: Neck supple.  Skin:    General: Skin is warm.  Neurological:     Mental Status: She is alert.     Comments: Did have 3/5 in right UE strength 4/5 In Right LE  Did not see any Facial Droop Left Side she had 4/5 strength in Both Upper and Lower extremity  Psychiatric:        Mood and Affect: Mood normal.        Thought Content: Thought content normal.     Comments: Gets Upset easily    Labs reviewed: Basic Metabolic Panel: Recent Labs    11/27/19 0553 11/28/19 0504 11/29/19 0441 04/20/20 0000 07/20/20 0000 07/23/20 0000  NA 139 140 138 135* 138 139  K 3.4* 4.1 3.8 3.7 4.1 3.8  CL 109 114* 108 98* 104 108  CO2 19* 18* 17* 25* 24* 23*  GLUCOSE 104* 93 99  --   --   --   BUN 49* 29* 27* 44* 65* 39*  CREATININE 1.62* 1.25* 1.20* 1.6* 2.4* 1.6*  CALCIUM 7.8* 8.0* 7.9* 9.6 8.7 8.3*  MG 2.0 1.8 2.1  --   --   --   PHOS 2.2* 1.7* 2.9  --   --   --    Liver Function Tests: Recent Labs    11/27/19 0553 11/28/19 0504 11/29/19 0441 04/20/20 0000  AST 16 14* 16 17  ALT 10 10 11 11   ALKPHOS 103 95 97 114  BILITOT 0.4 0.5 0.4  --   PROT 5.8* 5.4* 5.4*  --   ALBUMIN 2.6* 2.4* 2.3*  --    No results for input(s): LIPASE, AMYLASE in the last 8760 hours. No results for input(s): AMMONIA in the last 8760 hours. CBC: Recent Labs    11/27/19 0553  11/28/19 0504 11/29/19 0441 07/20/20 0000  WBC 10.2 7.1 7.3 11.7  NEUTROABS 8.2* 5.7 5.6 11.00  HGB 9.0* 8.5* 9.2* 9.4*  HCT 28.2* 26.9* 28.4* 28*  MCV 92.5 93.1 92.8  --   PLT 217 206 257 185   Cardiac Enzymes: Recent Labs    11/22/19 1844  CKTOTAL 32*   BNP: Invalid input(s): POCBNP Lab Results  Component Value Date   HGBA1C 5.3 08/02/2013   Lab Results  Component Value Date   TSH 0.94 07/13/2019   Lab Results  Component Value Date   [id number] 390 07/06/2009   Lab Results  Component Value Date   FOLATE  07/06/2009    >20.0 (NOTE)  Reference Ranges        Deficient:       0.4 - 3.3 ng/mL        Indeterminate:   3.4 - 5.4 ng/mL        Normal:              > 5.4 ng/mL   Lab Results  Component Value Date   IRON 42 09/22/2009   TIBC 281 09/22/2009   FERRITIN 49 06/26/2012    Imaging and Procedures obtained prior to SNF admission: DG Pelvis 1-2 Views  Result Date: 11/22/2019 CLINICAL DATA:  Recent fall with pelvic pain, initial encounter EXAM: PELVIS - 1-2 VIEW COMPARISON:  08/03/2017 FINDINGS: Pelvic ring is intact. Fragmentation is noted along the superior pubic ramus on the left. No other definitive fracture is seen. No soft tissue abnormality is noted. IMPRESSION: Left superior pubic ramus fracture.  No other focal abnormality is noted. Electronically Signed   By: Inez Catalina M.D.   On: 11/22/2019 17:15   DG Shoulder Right  Result Date: 11/22/2019 CLINICAL DATA:  Recent fall with right shoulder pain, initial encounter EXAM: RIGHT SHOULDER - 2+ VIEW COMPARISON:  None. FINDINGS: Degenerative changes of the acromioclavicular joint and glenohumeral joint are seen. The is high-riding articulating with the acromion consistent with chronic rotator cuff injury. Bony density is noted anterior to the humeral head on the Y-view which suggests a coracoid fracture. Additionally fragmentation in the scapular body is noted. IMPRESSION: Changes consistent with scapular fracture  in the body as well as at the coracoid process. Cross-sectional imaging may be helpful for further evaluation. Degenerative changes are noted about the shoulder joint with findings of a chronic rotator cuff injury. Electronically Signed   By: Inez Catalina M.D.   On: 11/22/2019 17:00   DG Elbow Complete Right  Result Date: 11/22/2019 CLINICAL DATA:  Fall 2 days ago with elbow pain, initial encounter EXAM: RIGHT ELBOW - COMPLETE 3+ VIEW COMPARISON:  None. FINDINGS: No acute fracture or dislocation is noted. Small joint effusion is noted with elevation of the anterior fat pad. Mild soft tissue swelling is noted posteriorly. IMPRESSION: Small joint effusion without acute fracture. Mild soft tissue swelling is noted posteriorly. Electronically Signed   By: Inez Catalina M.D.   On: 11/22/2019 17:05   DG Ankle Complete Right  Result Date: 11/22/2019 CLINICAL DATA:  Recent fall with right ankle pain, initial encounter EXAM: RIGHT ANKLE - COMPLETE 3+ VIEW COMPARISON:  None. FINDINGS: Generalized osteopenia is noted. No soft tissue abnormality is seen. No acute fracture or dislocation is noted. Tarsal degenerative changes are seen. IMPRESSION: Chronic changes without acute abnormality. Electronically Signed   By: Inez Catalina M.D.   On: 11/22/2019 17:12   CT Head Wo Contrast  Result Date: 11/22/2019 CLINICAL DATA:  87 year old female status post fall 3 days ago with pain. EXAM: CT HEAD WITHOUT CONTRAST TECHNIQUE: Contiguous axial images were obtained from the base of the skull through the vertex without intravenous contrast. COMPARISON:  Brain MRI 08/03/2017.  Head CT 10/06/2017. FINDINGS: Brain: Cerebral volume is not significantly changed since 2019. Patchy and confluent bilateral white matter hypodensity appears increased. No midline shift, ventriculomegaly, mass effect, evidence of mass lesion, intracranial hemorrhage or evidence of cortically based acute infarction. Vascular: Calcified atherosclerosis at  the skull base. No suspicious intracranial vascular hyperdensity. Skull: No acute osseous abnormality identified. Sinuses/Orbits: New low-density fluid or mucosal thickening in the right posterior ethmoid, sphenoid sinuses. Other visible sinuses, tympanic cavities and mastoids remain well pneumatized. Other: No acute orbit or scalp soft tissue finding. IMPRESSION: 1. No acute intracranial abnormality or recent traumatic injury identified. 2. Progressed chronic white matter disease since 2019. 3. New mild posterior ethmoid and sphenoid sinus inflammation. Electronically Signed   By: Genevie Ann M.D.   On: 11/22/2019 19:55   CT Lumbar Spine Wo Contrast  Result Date: 11/22/2019 CLINICAL DATA:  Fall.  Severe back pain EXAM: CT LUMBAR SPINE WITHOUT CONTRAST TECHNIQUE: Multidetector CT imaging of the lumbar spine was performed without intravenous contrast administration. Multiplanar CT image reconstructions were also generated. COMPARISON:  Lumbar spine radiographs 02/23/2015 FINDINGS: Segmentation: Normal Alignment: Moderate levoscoliosis.  Normal sagittal alignment. Vertebrae: Severe compression fracture L1 vertebral body. This has progressed since the prior study in 2017 when there was a mild fracture of the superior endplate of L1. Mild bony retropulsion of the superior endplate of  L1 into the canal causing mild spinal stenosis. No other fracture Paraspinal and other soft tissues: No significant paraspinous edema at the L1 fracture. Atherosclerotic aorta without aneurysm Cholelithiasis Large fluid collection in the left pelvis measuring 4.5 x 7 cm. This appears to be adnexal in location. Disc levels: Multilevel disc and facet degeneration throughout the lumbar spine. Mild spinal stenosis at L1 due to posterior retropulsion of superior endplate. No other areas of significant spinal stenosis. IMPRESSION: 1. Severe compression fracture of L1, with progression since 2017. This could be a superimposed acute fracture or  chronic fracture. Mild spinal stenosis due to retropulsion into the canal. No other fracture. 2. Cholelithiasis 3. 4.5 x 7 mm fluid collection left pelvis likely from the adnexa. Because this lesion is not adequately characterized, prompt [place] is recommended for further evaluation. Note: This recommendation does not apply to premenarchal patients and to those with increased risk (genetic, family history, elevated tumor markers or other high-risk factors) of ovarian cancer. Reference: JACR 2020 Feb; 17(2):248-254 Electronically Signed   By: Franchot Gallo M.D.   On: 11/22/2019 16:17   CT PELVIS WO CONTRAST  Result Date: 11/22/2019 CLINICAL DATA:  Fall several days ago with pelvic pain, initial encounter EXAM: CT PELVIS WITHOUT CONTRAST TECHNIQUE: Multidetector CT imaging of the pelvis was performed following the standard protocol without intravenous contrast. COMPARISON:  CT of the right femur from earlier in the same day, CT from 09/18/2014. FINDINGS: Urinary Tract:  Bladder is partially distended. Bowel: Scattered feet fecal material is noted throughout the colon. Mild diverticular changes noted. Some very mild inflammatory changes are noted surrounding the distal descending colon which may represent some very mild diverticulitis. Vascular/Lymphatic: Atherosclerotic calcifications are noted without aneurysmal dilatation. No sizable lymphadenopathy is noted. Reproductive: Uterus is within normal limits. Scattered fluid attenuation lesions are noted within the adnexa. The largest of these lies on the left measuring approximately 6.8 cm. This is similar to that seen on prior CT from 2016 and consistent with a benign etiology. Other:  None. Musculoskeletal: Degenerative changes of lumbar spine are noted. The sacrum appears within normal limits. Old healed fractures of the superior and inferior pubic rami on the right are noted. There are changes of acute fracture adjacent to the pubic symphysis in both the inferior  and superior pubic rami. This corresponds with that seen on prior CT as well as the recent plain film evaluation. Some adjacent stranding is noted consistent with localized edema/hemorrhage related to the injury. No focal confluent hematoma is seen. IMPRESSION: Fractures of the left superior and inferior pubic rami adjacent to the pubic symphysis as described above similar to that seen on prior CT of the femur. Large left adnexal cyst similar to that noted on a prior CT from 2016. Given its long-term stability it is felt to be benign in etiology. Changes suggestive of early diverticulitis in the distal descending colon. Electronically Signed   By: Inez Catalina M.D.   On: 11/22/2019 19:58   CT FEMUR RIGHT WO CONTRAST  Result Date: 11/22/2019 CLINICAL DATA:  Severe right thigh pain after fall on Saturday. EXAM: CT OF THE LOWER RIGHT EXTREMITY WITHOUT CONTRAST TECHNIQUE: Multidetector CT imaging of the right lower extremity was performed according to the standard protocol. COMPARISON:  Right femur x-rays dated August 03, 2017. FINDINGS: Bones/Joint/Cartilage Partially visualized acute parasymphyseal fractures of the left superior and inferior pubic rami. Old healed fractures of the right superior and inferior pubic rami. No dislocation. Serpiginous subchondral sclerosis in the medial  tibial plateau likely represents a a bone infarct. Mild tricompartmental degenerative changes of the knee. The right hip joint space is preserved. Osteopenia. No joint effusion. Ligaments Ligaments are suboptimally evaluated by CT. Muscles and Tendons Grossly intact. Atrophy of the semimembranosus and biceps femoris muscles. Soft tissue Small amount of ill-defined high-density material in the superficial fat of the right hip along the inferior aspect of the greater trochanter, likely hemorrhage. No soft tissue mass. 6.9 x 5.2 cm simple appearing cystic lesion arising from the left ovary. IMPRESSION: 1. Partially visualized acute  parasymphyseal fractures of the left superior and inferior pubic rami. Old healed fractures of the right superior and inferior pubic rami. 2. Small ill-defined hematoma in the superficial fat of the right hip along the inferior aspect of the greater trochanter. No underlying fracture. 3. 6.9 cm cystic lesion arising from the left ovary. Because this lesion is not adequately characterized, prompt [place] is recommended for further evaluation. Note: This recommendation does not apply to premenarchal patients and to those with increased risk (genetic, family history, elevated tumor markers or other high-risk factors) of ovarian cancer. Reference: JACR 2020 Feb; 17(2):248-254 Electronically Signed   By: Titus Dubin M.D.   On: 11/22/2019 16:34   DG Chest Port 1 View  Result Date: 11/22/2019 CLINICAL DATA:  Admission for pneumonia, known fractures of the shoulder and pelvis. EXAM: PORTABLE CHEST 1 VIEW COMPARISON:  Chest x-ray 02/11/2018, CT chest 01/31/2017 FINDINGS: Wireless cardiac device overlies the left chest. The heart size and mediastinal contours are unchanged. Redemonstration of a large lucency overlying the mediastinum consistent with known hiatal hernia. Aortic arch calcification. Right base airspace opacity as well as right trace pleural effusion. No definite consolidation within the left lung with persistent passive atelectasis of the lingula. No pulmonary edema. No left pleural effusion. No pneumothorax. No acute displaced fracture identified. Per history, there is a known shoulder fracture does not visualized on this study and may be collimated off view. IMPRESSION: 1. Right basilar airspace opacity with associated trace pleural effusion suggestive of infection/inflammation. Followup PA and lateral chest X-ray is recommended in 3-4 weeks following trial of antibiotic therapy to ensure resolution and exclude underlying malignancy. 2. Large hiatal hernia. 3.  Aortic Atherosclerosis (ICD10-I70.0).  Electronically Signed   By: Iven Finn M.D.   On: 11/22/2019 20:48   DG Knee Complete 4 Views Right  Result Date: 11/22/2019 CLINICAL DATA:  Recent fall with right knee pain, initial encounter EXAM: RIGHT KNEE - COMPLETE 4+ VIEW COMPARISON:  08/03/2017 FINDINGS: No acute fracture or dislocation is noted. Calcification in the medial tibial plateau is noted without acute abnormality. This is stable from the prior exam. No acute abnormality noted. IMPRESSION: No acute abnormality noted. Electronically Signed   By: Inez Catalina M.D.   On: 11/22/2019 17:09    Assessment/Plan Lobar pneumonia ([place]) Doing well on Augmentin  Acute renal failure, unspecified acute renal failure type ([place]) Creat back to Baseline after IV fluids She has very Poor appetite per her daughter She does not eat anything except drinks Boost Weakness Will work with therapy  Cerebrovascular accident (CVA), unspecified mechanism ([place]) D/w daughter No aggressive work up per Patients wishes She got very upset. And was clear does not want to go to ED On Baby Aspirin Cannot increase due to h/o ICH  Persistent atrial fibrillation (HCC) Ordered EKGas her rhythm was fast and irregular No Anticoagulation On Amiodarone Expressive aphasia  Essential hypertension On Coreg ans Hydralazine Refusing to take it sometimes  Anemia, unspecified type Chronic with CKD  HLD On statin Will need follow up of her Tsh and Lipid Panel in Next visit  Family/ staff Communication: Daughter in the room  Labs/tests ordered:EKG

## 2020-07-25 ENCOUNTER — Encounter: Payer: Self-pay | Admitting: Internal Medicine

## 2020-07-25 DIAGNOSIS — J188 Other pneumonia, unspecified organism: Secondary | ICD-10-CM | POA: Diagnosis not present

## 2020-07-25 DIAGNOSIS — M21371 Foot drop, right foot: Secondary | ICD-10-CM | POA: Diagnosis not present

## 2020-07-25 DIAGNOSIS — R278 Other lack of coordination: Secondary | ICD-10-CM | POA: Diagnosis not present

## 2020-07-25 DIAGNOSIS — R4701 Aphasia: Secondary | ICD-10-CM | POA: Diagnosis not present

## 2020-07-25 DIAGNOSIS — I69151 Hemiplegia and hemiparesis following nontraumatic intracerebral hemorrhage affecting right dominant side: Secondary | ICD-10-CM | POA: Diagnosis not present

## 2020-07-25 DIAGNOSIS — R2689 Other abnormalities of gait and mobility: Secondary | ICD-10-CM | POA: Diagnosis not present

## 2020-07-25 DIAGNOSIS — M6389 Disorders of muscle in diseases classified elsewhere, multiple sites: Secondary | ICD-10-CM | POA: Diagnosis not present

## 2020-07-25 DIAGNOSIS — F0151 Vascular dementia with behavioral disturbance: Secondary | ICD-10-CM | POA: Diagnosis not present

## 2020-07-25 DIAGNOSIS — B960 Mycoplasma pneumoniae [M. pneumoniae] as the cause of diseases classified elsewhere: Secondary | ICD-10-CM | POA: Diagnosis not present

## 2020-07-26 DIAGNOSIS — B960 Mycoplasma pneumoniae [M. pneumoniae] as the cause of diseases classified elsewhere: Secondary | ICD-10-CM | POA: Diagnosis not present

## 2020-07-26 DIAGNOSIS — R4701 Aphasia: Secondary | ICD-10-CM | POA: Diagnosis not present

## 2020-07-26 DIAGNOSIS — R278 Other lack of coordination: Secondary | ICD-10-CM | POA: Diagnosis not present

## 2020-07-26 DIAGNOSIS — R2689 Other abnormalities of gait and mobility: Secondary | ICD-10-CM | POA: Diagnosis not present

## 2020-07-26 DIAGNOSIS — F0151 Vascular dementia with behavioral disturbance: Secondary | ICD-10-CM | POA: Diagnosis not present

## 2020-07-26 DIAGNOSIS — I69151 Hemiplegia and hemiparesis following nontraumatic intracerebral hemorrhage affecting right dominant side: Secondary | ICD-10-CM | POA: Diagnosis not present

## 2020-07-26 DIAGNOSIS — M6389 Disorders of muscle in diseases classified elsewhere, multiple sites: Secondary | ICD-10-CM | POA: Diagnosis not present

## 2020-07-26 DIAGNOSIS — M21371 Foot drop, right foot: Secondary | ICD-10-CM | POA: Diagnosis not present

## 2020-07-26 DIAGNOSIS — J188 Other pneumonia, unspecified organism: Secondary | ICD-10-CM | POA: Diagnosis not present

## 2020-07-27 DIAGNOSIS — B960 Mycoplasma pneumoniae [M. pneumoniae] as the cause of diseases classified elsewhere: Secondary | ICD-10-CM | POA: Diagnosis not present

## 2020-07-27 DIAGNOSIS — M6389 Disorders of muscle in diseases classified elsewhere, multiple sites: Secondary | ICD-10-CM | POA: Diagnosis not present

## 2020-07-27 DIAGNOSIS — I69151 Hemiplegia and hemiparesis following nontraumatic intracerebral hemorrhage affecting right dominant side: Secondary | ICD-10-CM | POA: Diagnosis not present

## 2020-07-27 DIAGNOSIS — R278 Other lack of coordination: Secondary | ICD-10-CM | POA: Diagnosis not present

## 2020-07-27 DIAGNOSIS — R4701 Aphasia: Secondary | ICD-10-CM | POA: Diagnosis not present

## 2020-07-27 DIAGNOSIS — M21371 Foot drop, right foot: Secondary | ICD-10-CM | POA: Diagnosis not present

## 2020-07-27 DIAGNOSIS — J188 Other pneumonia, unspecified organism: Secondary | ICD-10-CM | POA: Diagnosis not present

## 2020-07-27 DIAGNOSIS — R2689 Other abnormalities of gait and mobility: Secondary | ICD-10-CM | POA: Diagnosis not present

## 2020-07-27 DIAGNOSIS — F0151 Vascular dementia with behavioral disturbance: Secondary | ICD-10-CM | POA: Diagnosis not present

## 2020-07-28 DIAGNOSIS — I69151 Hemiplegia and hemiparesis following nontraumatic intracerebral hemorrhage affecting right dominant side: Secondary | ICD-10-CM | POA: Diagnosis not present

## 2020-07-28 DIAGNOSIS — R2689 Other abnormalities of gait and mobility: Secondary | ICD-10-CM | POA: Diagnosis not present

## 2020-07-28 DIAGNOSIS — B960 Mycoplasma pneumoniae [M. pneumoniae] as the cause of diseases classified elsewhere: Secondary | ICD-10-CM | POA: Diagnosis not present

## 2020-07-28 DIAGNOSIS — R278 Other lack of coordination: Secondary | ICD-10-CM | POA: Diagnosis not present

## 2020-07-28 DIAGNOSIS — F0151 Vascular dementia with behavioral disturbance: Secondary | ICD-10-CM | POA: Diagnosis not present

## 2020-07-28 DIAGNOSIS — M21371 Foot drop, right foot: Secondary | ICD-10-CM | POA: Diagnosis not present

## 2020-07-28 DIAGNOSIS — M6389 Disorders of muscle in diseases classified elsewhere, multiple sites: Secondary | ICD-10-CM | POA: Diagnosis not present

## 2020-07-28 DIAGNOSIS — R4701 Aphasia: Secondary | ICD-10-CM | POA: Diagnosis not present

## 2020-07-28 DIAGNOSIS — J188 Other pneumonia, unspecified organism: Secondary | ICD-10-CM | POA: Diagnosis not present

## 2020-07-31 DIAGNOSIS — M21371 Foot drop, right foot: Secondary | ICD-10-CM | POA: Diagnosis not present

## 2020-07-31 DIAGNOSIS — F0151 Vascular dementia with behavioral disturbance: Secondary | ICD-10-CM | POA: Diagnosis not present

## 2020-07-31 DIAGNOSIS — R2689 Other abnormalities of gait and mobility: Secondary | ICD-10-CM | POA: Diagnosis not present

## 2020-07-31 DIAGNOSIS — I69151 Hemiplegia and hemiparesis following nontraumatic intracerebral hemorrhage affecting right dominant side: Secondary | ICD-10-CM | POA: Diagnosis not present

## 2020-07-31 DIAGNOSIS — R4701 Aphasia: Secondary | ICD-10-CM | POA: Diagnosis not present

## 2020-07-31 DIAGNOSIS — J188 Other pneumonia, unspecified organism: Secondary | ICD-10-CM | POA: Diagnosis not present

## 2020-07-31 DIAGNOSIS — R278 Other lack of coordination: Secondary | ICD-10-CM | POA: Diagnosis not present

## 2020-08-01 DIAGNOSIS — I69151 Hemiplegia and hemiparesis following nontraumatic intracerebral hemorrhage affecting right dominant side: Secondary | ICD-10-CM | POA: Diagnosis not present

## 2020-08-01 DIAGNOSIS — B960 Mycoplasma pneumoniae [M. pneumoniae] as the cause of diseases classified elsewhere: Secondary | ICD-10-CM | POA: Diagnosis not present

## 2020-08-01 DIAGNOSIS — R2689 Other abnormalities of gait and mobility: Secondary | ICD-10-CM | POA: Diagnosis not present

## 2020-08-01 DIAGNOSIS — M6389 Disorders of muscle in diseases classified elsewhere, multiple sites: Secondary | ICD-10-CM | POA: Diagnosis not present

## 2020-08-01 DIAGNOSIS — R4701 Aphasia: Secondary | ICD-10-CM | POA: Diagnosis not present

## 2020-08-01 DIAGNOSIS — J188 Other pneumonia, unspecified organism: Secondary | ICD-10-CM | POA: Diagnosis not present

## 2020-08-01 DIAGNOSIS — F0151 Vascular dementia with behavioral disturbance: Secondary | ICD-10-CM | POA: Diagnosis not present

## 2020-08-01 DIAGNOSIS — R278 Other lack of coordination: Secondary | ICD-10-CM | POA: Diagnosis not present

## 2020-08-01 DIAGNOSIS — M21371 Foot drop, right foot: Secondary | ICD-10-CM | POA: Diagnosis not present

## 2020-08-02 DIAGNOSIS — R278 Other lack of coordination: Secondary | ICD-10-CM | POA: Diagnosis not present

## 2020-08-02 DIAGNOSIS — R4701 Aphasia: Secondary | ICD-10-CM | POA: Diagnosis not present

## 2020-08-02 DIAGNOSIS — F0151 Vascular dementia with behavioral disturbance: Secondary | ICD-10-CM | POA: Diagnosis not present

## 2020-08-02 DIAGNOSIS — R2689 Other abnormalities of gait and mobility: Secondary | ICD-10-CM | POA: Diagnosis not present

## 2020-08-02 DIAGNOSIS — I69151 Hemiplegia and hemiparesis following nontraumatic intracerebral hemorrhage affecting right dominant side: Secondary | ICD-10-CM | POA: Diagnosis not present

## 2020-08-02 DIAGNOSIS — J188 Other pneumonia, unspecified organism: Secondary | ICD-10-CM | POA: Diagnosis not present

## 2020-08-02 DIAGNOSIS — M21371 Foot drop, right foot: Secondary | ICD-10-CM | POA: Diagnosis not present

## 2020-08-03 DIAGNOSIS — R278 Other lack of coordination: Secondary | ICD-10-CM | POA: Diagnosis not present

## 2020-08-03 DIAGNOSIS — M6389 Disorders of muscle in diseases classified elsewhere, multiple sites: Secondary | ICD-10-CM | POA: Diagnosis not present

## 2020-08-03 DIAGNOSIS — B960 Mycoplasma pneumoniae [M. pneumoniae] as the cause of diseases classified elsewhere: Secondary | ICD-10-CM | POA: Diagnosis not present

## 2020-08-03 NOTE — Progress Notes (Signed)
Carelink Summary Report / Loop Recorder 

## 2020-08-04 DIAGNOSIS — I69151 Hemiplegia and hemiparesis following nontraumatic intracerebral hemorrhage affecting right dominant side: Secondary | ICD-10-CM | POA: Diagnosis not present

## 2020-08-04 DIAGNOSIS — R278 Other lack of coordination: Secondary | ICD-10-CM | POA: Diagnosis not present

## 2020-08-04 DIAGNOSIS — R4701 Aphasia: Secondary | ICD-10-CM | POA: Diagnosis not present

## 2020-08-04 DIAGNOSIS — J188 Other pneumonia, unspecified organism: Secondary | ICD-10-CM | POA: Diagnosis not present

## 2020-08-04 DIAGNOSIS — M21371 Foot drop, right foot: Secondary | ICD-10-CM | POA: Diagnosis not present

## 2020-08-04 DIAGNOSIS — R2689 Other abnormalities of gait and mobility: Secondary | ICD-10-CM | POA: Diagnosis not present

## 2020-08-04 DIAGNOSIS — F0151 Vascular dementia with behavioral disturbance: Secondary | ICD-10-CM | POA: Diagnosis not present

## 2020-08-07 DIAGNOSIS — B960 Mycoplasma pneumoniae [M. pneumoniae] as the cause of diseases classified elsewhere: Secondary | ICD-10-CM | POA: Diagnosis not present

## 2020-08-07 DIAGNOSIS — M6389 Disorders of muscle in diseases classified elsewhere, multiple sites: Secondary | ICD-10-CM | POA: Diagnosis not present

## 2020-08-07 DIAGNOSIS — R4701 Aphasia: Secondary | ICD-10-CM | POA: Diagnosis not present

## 2020-08-07 DIAGNOSIS — M21371 Foot drop, right foot: Secondary | ICD-10-CM | POA: Diagnosis not present

## 2020-08-07 DIAGNOSIS — F0151 Vascular dementia with behavioral disturbance: Secondary | ICD-10-CM | POA: Diagnosis not present

## 2020-08-07 DIAGNOSIS — J188 Other pneumonia, unspecified organism: Secondary | ICD-10-CM | POA: Diagnosis not present

## 2020-08-07 DIAGNOSIS — I69151 Hemiplegia and hemiparesis following nontraumatic intracerebral hemorrhage affecting right dominant side: Secondary | ICD-10-CM | POA: Diagnosis not present

## 2020-08-07 DIAGNOSIS — R2689 Other abnormalities of gait and mobility: Secondary | ICD-10-CM | POA: Diagnosis not present

## 2020-08-07 DIAGNOSIS — R278 Other lack of coordination: Secondary | ICD-10-CM | POA: Diagnosis not present

## 2020-08-08 DIAGNOSIS — M21371 Foot drop, right foot: Secondary | ICD-10-CM | POA: Diagnosis not present

## 2020-08-08 DIAGNOSIS — B960 Mycoplasma pneumoniae [M. pneumoniae] as the cause of diseases classified elsewhere: Secondary | ICD-10-CM | POA: Diagnosis not present

## 2020-08-08 DIAGNOSIS — R2689 Other abnormalities of gait and mobility: Secondary | ICD-10-CM | POA: Diagnosis not present

## 2020-08-08 DIAGNOSIS — M6389 Disorders of muscle in diseases classified elsewhere, multiple sites: Secondary | ICD-10-CM | POA: Diagnosis not present

## 2020-08-08 DIAGNOSIS — R278 Other lack of coordination: Secondary | ICD-10-CM | POA: Diagnosis not present

## 2020-08-08 DIAGNOSIS — J188 Other pneumonia, unspecified organism: Secondary | ICD-10-CM | POA: Diagnosis not present

## 2020-08-08 DIAGNOSIS — F0151 Vascular dementia with behavioral disturbance: Secondary | ICD-10-CM | POA: Diagnosis not present

## 2020-08-08 DIAGNOSIS — R4701 Aphasia: Secondary | ICD-10-CM | POA: Diagnosis not present

## 2020-08-08 DIAGNOSIS — I69151 Hemiplegia and hemiparesis following nontraumatic intracerebral hemorrhage affecting right dominant side: Secondary | ICD-10-CM | POA: Diagnosis not present

## 2020-08-09 DIAGNOSIS — B960 Mycoplasma pneumoniae [M. pneumoniae] as the cause of diseases classified elsewhere: Secondary | ICD-10-CM | POA: Diagnosis not present

## 2020-08-09 DIAGNOSIS — R2689 Other abnormalities of gait and mobility: Secondary | ICD-10-CM | POA: Diagnosis not present

## 2020-08-09 DIAGNOSIS — F0151 Vascular dementia with behavioral disturbance: Secondary | ICD-10-CM | POA: Diagnosis not present

## 2020-08-09 DIAGNOSIS — M21371 Foot drop, right foot: Secondary | ICD-10-CM | POA: Diagnosis not present

## 2020-08-09 DIAGNOSIS — I69151 Hemiplegia and hemiparesis following nontraumatic intracerebral hemorrhage affecting right dominant side: Secondary | ICD-10-CM | POA: Diagnosis not present

## 2020-08-09 DIAGNOSIS — R4701 Aphasia: Secondary | ICD-10-CM | POA: Diagnosis not present

## 2020-08-09 DIAGNOSIS — M6389 Disorders of muscle in diseases classified elsewhere, multiple sites: Secondary | ICD-10-CM | POA: Diagnosis not present

## 2020-08-09 DIAGNOSIS — R278 Other lack of coordination: Secondary | ICD-10-CM | POA: Diagnosis not present

## 2020-08-09 DIAGNOSIS — J188 Other pneumonia, unspecified organism: Secondary | ICD-10-CM | POA: Diagnosis not present

## 2020-08-10 DIAGNOSIS — R278 Other lack of coordination: Secondary | ICD-10-CM | POA: Diagnosis not present

## 2020-08-10 DIAGNOSIS — B960 Mycoplasma pneumoniae [M. pneumoniae] as the cause of diseases classified elsewhere: Secondary | ICD-10-CM | POA: Diagnosis not present

## 2020-08-10 DIAGNOSIS — M6389 Disorders of muscle in diseases classified elsewhere, multiple sites: Secondary | ICD-10-CM | POA: Diagnosis not present

## 2020-08-11 ENCOUNTER — Ambulatory Visit (INDEPENDENT_AMBULATORY_CARE_PROVIDER_SITE_OTHER): Payer: Medicare Other

## 2020-08-11 DIAGNOSIS — I639 Cerebral infarction, unspecified: Secondary | ICD-10-CM

## 2020-08-14 LAB — CUP PACEART REMOTE DEVICE CHECK
Date Time Interrogation Session: 20220630013915
Implantable Pulse Generator Implant Date: 20190903

## 2020-08-15 ENCOUNTER — Other Ambulatory Visit: Payer: Self-pay | Admitting: Orthopedic Surgery

## 2020-08-15 DIAGNOSIS — M21371 Foot drop, right foot: Secondary | ICD-10-CM | POA: Diagnosis not present

## 2020-08-15 DIAGNOSIS — M25511 Pain in right shoulder: Secondary | ICD-10-CM

## 2020-08-15 DIAGNOSIS — J188 Other pneumonia, unspecified organism: Secondary | ICD-10-CM | POA: Diagnosis not present

## 2020-08-15 DIAGNOSIS — R2689 Other abnormalities of gait and mobility: Secondary | ICD-10-CM | POA: Diagnosis not present

## 2020-08-15 DIAGNOSIS — R278 Other lack of coordination: Secondary | ICD-10-CM | POA: Diagnosis not present

## 2020-08-15 DIAGNOSIS — F0151 Vascular dementia with behavioral disturbance: Secondary | ICD-10-CM | POA: Diagnosis not present

## 2020-08-15 DIAGNOSIS — R4701 Aphasia: Secondary | ICD-10-CM | POA: Diagnosis not present

## 2020-08-15 DIAGNOSIS — I69151 Hemiplegia and hemiparesis following nontraumatic intracerebral hemorrhage affecting right dominant side: Secondary | ICD-10-CM | POA: Diagnosis not present

## 2020-08-15 DIAGNOSIS — M6389 Disorders of muscle in diseases classified elsewhere, multiple sites: Secondary | ICD-10-CM | POA: Diagnosis not present

## 2020-08-15 DIAGNOSIS — B960 Mycoplasma pneumoniae [M. pneumoniae] as the cause of diseases classified elsewhere: Secondary | ICD-10-CM | POA: Diagnosis not present

## 2020-08-15 MED ORDER — TRAMADOL HCL 50 MG PO TABS: 50.0000 mg | ORAL_TABLET | Freq: Two times a day (BID) | ORAL | 0 refills | Status: DC

## 2020-08-17 DIAGNOSIS — M6389 Disorders of muscle in diseases classified elsewhere, multiple sites: Secondary | ICD-10-CM | POA: Diagnosis not present

## 2020-08-17 DIAGNOSIS — R278 Other lack of coordination: Secondary | ICD-10-CM | POA: Diagnosis not present

## 2020-08-17 DIAGNOSIS — B960 Mycoplasma pneumoniae [M. pneumoniae] as the cause of diseases classified elsewhere: Secondary | ICD-10-CM | POA: Diagnosis not present

## 2020-08-22 DIAGNOSIS — M6389 Disorders of muscle in diseases classified elsewhere, multiple sites: Secondary | ICD-10-CM | POA: Diagnosis not present

## 2020-08-22 DIAGNOSIS — B960 Mycoplasma pneumoniae [M. pneumoniae] as the cause of diseases classified elsewhere: Secondary | ICD-10-CM | POA: Diagnosis not present

## 2020-08-22 DIAGNOSIS — R278 Other lack of coordination: Secondary | ICD-10-CM | POA: Diagnosis not present

## 2020-08-24 DIAGNOSIS — R278 Other lack of coordination: Secondary | ICD-10-CM | POA: Diagnosis not present

## 2020-08-24 DIAGNOSIS — M6389 Disorders of muscle in diseases classified elsewhere, multiple sites: Secondary | ICD-10-CM | POA: Diagnosis not present

## 2020-08-24 DIAGNOSIS — B960 Mycoplasma pneumoniae [M. pneumoniae] as the cause of diseases classified elsewhere: Secondary | ICD-10-CM | POA: Diagnosis not present

## 2020-08-29 ENCOUNTER — Other Ambulatory Visit: Payer: Self-pay | Admitting: Orthopedic Surgery

## 2020-08-29 DIAGNOSIS — M25511 Pain in right shoulder: Secondary | ICD-10-CM

## 2020-08-29 MED ORDER — TRAMADOL HCL 50 MG PO TABS
50.0000 mg | ORAL_TABLET | Freq: Two times a day (BID) | ORAL | 0 refills | Status: DC
Start: 2020-08-29 — End: 2020-09-27

## 2020-08-30 NOTE — Progress Notes (Signed)
Carelink Summary Report / Loop Recorder 

## 2020-09-12 ENCOUNTER — Encounter: Payer: Self-pay | Admitting: Orthopedic Surgery

## 2020-09-12 ENCOUNTER — Non-Acute Institutional Stay: Payer: Medicare Other | Admitting: Orthopedic Surgery

## 2020-09-12 DIAGNOSIS — Z66 Do not resuscitate: Secondary | ICD-10-CM

## 2020-09-12 DIAGNOSIS — I10 Essential (primary) hypertension: Secondary | ICD-10-CM | POA: Diagnosis not present

## 2020-09-12 LAB — CUP PACEART REMOTE DEVICE CHECK
Date Time Interrogation Session: 20220802015209
Implantable Pulse Generator Implant Date: 20190903

## 2020-09-12 NOTE — Progress Notes (Signed)
Location:  [facility] Room Number: 093-O Place of Service:  ALF [phone]) Provider:  Yvonna Alanis, NP  Patient Care Team: Virgie Dad, MD as PCP - General (Internal Medicine) Benard Rink (Dentistry) [place], Heather, [place] (Optometry) Sydnee Levans, MD (Dermatology) Thompson Grayer, MD as Consulting Physician (Cardiology) [name], DO as Consulting Physician (Geriatric Medicine)  Extended Emergency Contact Information Primary Emergency Contact: [facility] Phone: [phone] Relation: Son  Code Status:  DNR Goals of care: Advanced Directive information Advanced Directives 09/12/2020  Does Patient Have a Medical Advance Directive? Yes  Type of Paramedic of [place];Living will;Out of facility DNR (pink MOST or yellow form)  Does patient want to make changes to medical advance directive? No - Patient declined  Copy of [place] in Chart? Yes - validated most recent copy scanned in chart (See row information)  Would patient like information on creating a medical advance directive? -  Pre-existing out of facility DNR order (yellow form or pink MOST form) Yellow form placed in chart (order not valid for inpatient use);Pink MOST form placed in chart (order not valid for inpatient use)     Chief Complaint  Patient presents with   Acute Visit    Elevated blood pressure     HPI:  Pt is a 87 y.o. female seen today for an acute visit for elevated blood pressure.   She currently resides in assisted living at [facility]. Past medical history includes: atrial fibrillation, stroke, right sided hemiparesis, aphasia, hypertension, GERD, hypothyroidism, HLD, and falls.   07/2020 she was admitted to rehabilitation for bibasilar pneumonia. She improved with Augmentin and IV fluids and was discharged back to AL.   In the past week, nursing staff reports elevated blood pressures. She is a poor  historian due to expressive aphasia. Nursing staff also reports she will refuse medications at times. It appears she will refuse Protonix only. Today, she denies chest pain, sob, headache or dizziness. She appeared frustrated during our visit and kept saying " no blood pressure medicine" throughout encounter. I printed out her blood pressures taken within the past week and shared readings with her. I explained elevated bp can put her at risk for another stroke. She agreed for me to increase her blood pressure medication and have staff take her bp twice daily for one week. Remains on a regular diet, denies adding salt to foods.   Recent blood pressures:  07/26- 162/86  07/25- 170/97  07/22-171/71  07/21- 154/87  No recent falls or injuries.   Nurse does not report any other concerns.   Past Medical History:  Diagnosis Date   Abdominal mass    AKI (acute kidney injury) ([place]) 09/15/2014   Atrial fibrillation (HCC)    Colitis, ulcerative (HCC)    Cough    WHITE SPUTUM FOR 2 WEEKS, NO FEVER   Degenerative joint disease    Diverticulosis    DVT (deep venous thrombosis) ([place]) 2009   BOTH LUNGS   GERD (gastroesophageal reflux disease)    Hyperlipidemia    Hyperplastic colon polyp    Hypertension    Hypothyroidism    Iron deficiency anemia    ON IRON AT TIMES   PE (pulmonary embolism) 2009   Persistent atrial fibrillation (HCC)    Positive H. pylori test 01/20/96   S/P dilatation of esophageal stricture 1992   Skin cancer    h/o basal and squamous skin cancer--> removed   Stroke ([place])  Past Surgical History:  Procedure Laterality Date   APPENDECTOMY     BREAST EXCISIONAL BIOPSY Left 1992   benign   BREAST LUMPECTOMY     benign   DILATION AND CURETTAGE OF UTERUS     for endometrial polyps   ENDOSCOPIC RETROGRADE CHOLANGIOPANCREATOGRAPHY (ERCP) WITH PROPOFOL N/A 03/23/2015   Procedure: ENDOSCOPIC RETROGRADE CHOLANGIOPANCREATOGRAPHY (ERCP) WITH PROPOFOL;  Surgeon: Milus Banister, MD;  Location: [facility];  Service: Endoscopy;  Laterality: N/A;   JOINT REPLACEMENT     LOOP RECORDER INSERTION N/A 10/14/2017   Procedure: LOOP RECORDER INSERTION;  Surgeon: Thompson Grayer, MD;  Location: [facility];  Service: Cardiovascular;  Laterality: N/A;   ORIF ANKLE FRACTURE Left    X 2   REPLACEMENT TOTAL KNEE     left   SURGERY DONE TO REMOVE LEFT ANKLE HARDWARE LATER     TONSILLECTOMY      Allergies  Allergen Reactions   Iohexol Hives   Ivp Dye [Iodinated Diagnostic Agents] Hives    Was able to tolerate Contrast Media for CT scan.   Scallops [Shellfish Allergy] Nausea And Vomiting    Projectile vomiting   Shellfish-Derived Products Nausea And Vomiting   Sulfa Antibiotics Hives    Outpatient Encounter Medications as of 09/12/2020  Medication Sig   acetaminophen (TYLENOL) 325 MG tablet Take 2 tablets (650 mg total) by mouth in the morning and at bedtime.   amiodarone (PACERONE) 100 MG tablet Take 1 tablet (100 mg total) by mouth daily.   aspirin EC 81 MG EC tablet Take 1 tablet (81 mg total) by mouth daily. Swallow whole.   atorvastatin (LIPITOR) 10 MG tablet Take 10 mg by mouth daily.   Ca Phosphate-Cholecalciferol (CALTRATE GUMMY BITES PO) Take 2 tablets by mouth 2 (two) times daily.    carvedilol (COREG) 6.25 MG tablet Take 1 tablet (6.25 mg total) by mouth in the morning AND 0.5 tablets (3.125 mg total) every evening.   hydrALAZINE (APRESOLINE) 10 MG tablet Take 1 tablet (10 mg total) by mouth in the morning and at bedtime.   hydroxypropyl methylcellulose / hypromellose (GONIOVISC) 2.5 % ophthalmic solution Place 1 drop into both eyes every 8 (eight) hours as needed for dry eyes.   ipratropium-albuterol (DUONEB) 0.5-2.5 (3) MG/3ML SOLN Take 3 mLs by nebulization every 6 (six) hours as needed (SOB/Wheezing /cough).    lactose free nutrition (BOOST PLUS) LIQD Take 237 mLs by mouth daily.   levothyroxine (SYNTHROID) 75 MCG tablet Take 1 tablet (75 mcg  total) by mouth daily before breakfast.   melatonin 10 MG TABS Take 10 mg by mouth at bedtime as needed (sleep).   ondansetron (ZOFRAN) 4 MG tablet Take 4 mg by mouth every 6 (six) hours as needed for nausea or vomiting.   pantoprazole (PROTONIX) 40 MG tablet Take 40 mg by mouth at bedtime.   senna (SENOKOT) 8.6 MG TABS tablet Take 1 tablet (8.6 mg total) by mouth daily as needed for mild constipation.   Simethicone (GAS RELIEF PO) Take 1 tablet by mouth 2 (two) times daily.   tiZANidine (ZANAFLEX) 2 MG tablet Take 1 tablet (2 mg total) by mouth 4 (four) times daily -  with meals and at bedtime.   traMADol (ULTRAM) 50 MG tablet Take 1 tablet (50 mg total) by mouth 2 (two) times daily.   triamcinolone cream (KENALOG) 0.1 % Apply 1 application topically as needed. Apply thin layer to rash on back   WITCH HAZEL EX Apply 1 application  topically 2 (two) times daily as needed (for comfort). Witch hazel pads   [DISCONTINUED] amoxicillin-clavulanate (AUGMENTIN) 875-125 MG tablet Take 1 tablet by mouth 2 (two) times daily. X 7 days for pna   No facility-administered encounter medications on file as of 09/12/2020.    Review of Systems  Constitutional:  Negative for activity change, appetite change, fatigue and fever.  Respiratory:  Negative for cough, shortness of breath and wheezing.   Cardiovascular:  Negative for chest pain and leg swelling.  Neurological:  Positive for weakness. Negative for dizziness and light-headedness.  Psychiatric/Behavioral:  Positive for confusion. Negative for dysphoric mood. The patient is not nervous/anxious.    Immunization History  Administered Date(s) Administered   H1N1 03/04/2008   Influenza Split 11/12/2010, 11/26/2011   Influenza Whole 11/26/2006, 11/03/2007, 12/10/2007, 11/16/2008, 11/20/2009   Influenza, High Dose Seasonal PF 12/10/2018, 12/03/2019   Influenza,inj,Quad PF,6+ Mos 11/24/2012, 11/29/2013, 10/14/2014, 11/28/2015, 12/04/2016, 12/11/2017    Influenza-Unspecified 12/06/2019   Moderna SARS-COV2 Booster Vaccination 12/23/2019   Moderna Sars-Covid-2 Vaccination 02/20/2019, 03/24/2019   PPD Test 10/03/2014   Pneumococcal Conjugate-13 09/10/2013   Pneumococcal Polysaccharide-23 02/12/2000   Td 02/11/1998, 04/13/2008   Zoster, Live 05/08/2006   Pertinent  Health Maintenance Due  Topic Date Due   INFLUENZA VACCINE  09/11/2020   DEXA SCAN  Completed   PNA vac Low Risk Adult  Completed   Fall Risk  12/24/2019 10/20/2019 04/14/2019 09/16/2018 03/18/2018  Falls in the past year? - 0 0 0 0  Number falls in past yr: - 0 0 0 0  Comment - - - - -  Injury with Fall? - 0 0 0 0  Risk for fall due to : History of fall(s);Impaired balance/gait;Impaired mobility;Mental status change;Orthopedic patient History of fall(s);Impaired balance/gait;Impaired mobility;Mental status change - - -  Follow up Falls evaluation completed;Education provided;Falls prevention discussed Falls evaluation completed;Education provided;Falls prevention discussed - - -   Functional Status Survey:    Vitals:   09/12/20 0958  BP: (!) 155/79  Pulse: 67  Resp: 16  Temp: (!) 97.5 F (36.4 C)  SpO2: 96%  Weight: 135 lb 12.8 oz (61.6 kg)  Height: 5' 1"  (1.549 m)   Body mass index is 25.66 kg/m. Physical Exam Vitals reviewed.  Constitutional:      General: She is not in acute distress. HENT:     Head: Normocephalic.  Cardiovascular:     Rate and Rhythm: Normal rate. Rhythm irregular.     Pulses: Normal pulses.     Heart sounds: Normal heart sounds. No murmur heard. Pulmonary:     Effort: Pulmonary effort is normal. No respiratory distress.     Breath sounds: Normal breath sounds. No wheezing.  Abdominal:     General: Abdomen is flat. Bowel sounds are normal. There is no distension.     Palpations: Abdomen is soft.     Tenderness: There is no abdominal tenderness.  Musculoskeletal:     Right lower leg: No edema.     Left lower leg: No edema.  Skin:     General: Skin is warm and dry.  Neurological:     General: No focal deficit present.     Mental Status: She is alert. Mental status is at baseline.     Motor: Weakness present.     Gait: Gait abnormal.     Comments: epressive aphasia, right sided weakness  Psychiatric:        Mood and Affect: Mood normal.  Behavior: Behavior normal.    Labs reviewed: Recent Labs    11/27/19 0553 11/28/19 0504 11/29/19 0441 04/20/20 0000 07/20/20 0000 07/23/20 0000  NA 139 140 138 135* 138 139  K 3.4* 4.1 3.8 3.7 4.1 3.8  CL 109 114* 108 98* 104 108  CO2 19* 18* 17* 25* 24* 23*  GLUCOSE 104* 93 99  --   --   --   BUN 49* 29* 27* 44* 65* 39*  CREATININE 1.62* 1.25* 1.20* 1.6* 2.4* 1.6*  CALCIUM 7.8* 8.0* 7.9* 9.6 8.7 8.3*  MG 2.0 1.8 2.1  --   --   --   PHOS 2.2* 1.7* 2.9  --   --   --    Recent Labs    11/27/19 0553 11/28/19 0504 11/29/19 0441 04/20/20 0000  AST 16 14* 16 17  ALT 10 10 11 11   ALKPHOS 103 95 97 114  BILITOT 0.4 0.5 0.4  --   PROT 5.8* 5.4* 5.4*  --   ALBUMIN 2.6* 2.4* 2.3*  --    Recent Labs    11/27/19 0553 11/28/19 0504 11/29/19 0441 07/20/20 0000  WBC 10.2 7.1 7.3 11.7  NEUTROABS 8.2* 5.7 5.6 11.00  HGB 9.0* 8.5* 9.2* 9.4*  HCT 28.2* 26.9* 28.4* 28*  MCV 92.5 93.1 92.8  --   PLT 217 206 257 185   Lab Results  Component Value Date   TSH 0.94 07/13/2019   Lab Results  Component Value Date   HGBA1C 5.3 08/02/2013   Lab Results  Component Value Date   CHOL 92 11/27/2019   HDL 30 (L) 11/27/2019   LDLCALC 46 11/27/2019   LDLDIRECT 85 08/18/2008   TRIG 82 11/27/2019   CHOLHDL 3.1 11/27/2019    Significant Diagnostic Results in last 30 days:  No results found.  Assessment/Plan 1. Do not resuscitate  2. Essential hypertension - uncontrolled - BUN/creat 39/1.6 07/23/2020 - increase coreg to 12.5 mg q AM and evening to 6.25 mg Qhs - cont hydralazine 10 mg bid - blood pressures 2 hours after bp meds bid x 1 week- report findings to  provider - may consider cmp if readings continue to be elevated after one week    Family/ staff Communication: plan discussed with patient and nurse  Labs/tests ordered:  none

## 2020-09-26 ENCOUNTER — Encounter: Payer: Self-pay | Admitting: Internal Medicine

## 2020-09-26 DIAGNOSIS — R4182 Altered mental status, unspecified: Secondary | ICD-10-CM | POA: Diagnosis not present

## 2020-09-26 LAB — CBC AND DIFFERENTIAL
HCT: 32 — AB (ref 36–46)
Hemoglobin: 10.7 — AB (ref 12.0–16.0)
Platelets: 220 (ref 150–399)
WBC: 4.4

## 2020-09-26 LAB — BASIC METABOLIC PANEL
BUN: 20 (ref 4–21)
CO2: 22 (ref 13–22)
Chloride: 100 (ref 99–108)
Creatinine: 1.9 — AB (ref 0.5–1.1)
Glucose: 128
Potassium: 4 (ref 3.4–5.3)
Sodium: 137 (ref 137–147)

## 2020-09-26 LAB — COMPREHENSIVE METABOLIC PANEL
Albumin: 3.9 (ref 3.5–5.0)
Calcium: 8.7 (ref 8.7–10.7)
Globulin: 2.7

## 2020-09-26 LAB — HEPATIC FUNCTION PANEL
ALT: 9 (ref 7–35)
AST: 18 (ref 13–35)
Alkaline Phosphatase: 94 (ref 25–125)
Bilirubin, Total: 0.2

## 2020-09-26 LAB — CBC: RBC: 3.59 — AB (ref 3.87–5.11)

## 2020-09-27 ENCOUNTER — Other Ambulatory Visit: Payer: Self-pay | Admitting: Orthopedic Surgery

## 2020-09-27 DIAGNOSIS — M25511 Pain in right shoulder: Secondary | ICD-10-CM

## 2020-09-27 MED ORDER — TRAMADOL HCL 50 MG PO TABS: 50.0000 mg | ORAL_TABLET | Freq: Two times a day (BID) | ORAL | 0 refills | Status: AC

## 2020-10-02 ENCOUNTER — Non-Acute Institutional Stay: Payer: Medicare Other | Admitting: Adult Health

## 2020-10-02 ENCOUNTER — Encounter: Payer: Self-pay | Admitting: Adult Health

## 2020-10-02 DIAGNOSIS — R531 Weakness: Secondary | ICD-10-CM | POA: Diagnosis not present

## 2020-10-02 DIAGNOSIS — I69151 Hemiplegia and hemiparesis following nontraumatic intracerebral hemorrhage affecting right dominant side: Secondary | ICD-10-CM

## 2020-10-02 DIAGNOSIS — R410 Disorientation, unspecified: Secondary | ICD-10-CM

## 2020-10-02 DIAGNOSIS — D649 Anemia, unspecified: Secondary | ICD-10-CM

## 2020-10-02 DIAGNOSIS — R4701 Aphasia: Secondary | ICD-10-CM

## 2020-10-02 DIAGNOSIS — N1831 Chronic kidney disease, stage 3a: Secondary | ICD-10-CM

## 2020-10-02 NOTE — Progress Notes (Signed)
Location:  Occupational psychologist of Service:  ALF (13) Provider:   Cindi Carbon, [place] [phone]   Virgie Dad, MD  Patient Care Team: Virgie Dad, MD as PCP - General (Internal Medicine) Benard Rink (Dentistry) [place], [place], [place] (Optometry) Sydnee Levans, MD (Dermatology) Thompson Grayer, MD as Consulting Physician (Cardiology) [name], DO as Consulting Physician (Geriatric Medicine)  Extended Emergency Contact Information Primary Emergency Contact: [facility] Phone: [phone] Relation: Son  Code Status:  DNR Goals of care: Advanced Directive information Advanced Directives 09/12/2020  Does Patient Have a Medical Advance Directive? Yes  Type of Paramedic of [place];Living will;Out of facility DNR (pink MOST or yellow form)  Does patient want to make changes to medical advance directive? No - Patient declined  Copy of [place] in Chart? Yes - validated most recent copy scanned in chart (See row information)  Would patient like information on creating a medical advance directive? -  Pre-existing out of facility DNR order (yellow form or pink MOST form) Yellow form placed in chart (order not valid for inpatient use);Pink MOST form placed in chart (order not valid for inpatient use)     Chief Complaint  Patient presents with   Acute Visit    Increased confusion    HPI:  Pt is a 87 y.o. female seen today for an acute visit for confusion.  PMH significant for CVA with right sided weakness, aphasia, thalamic hemorrhage, hypothyroid, CKD, HTN, afib, HLD, blood clot, IDA, falls, GERD, and cognitive impairment.  Nurse on AL reports that [name] has been more confused since 8/10.  She has been chewing her pills, refusing them, putting them in her pockets, walking into the walls, and confused about who she is from her baseline. Needs more assistance with  ambulation and care.   BPs 794-801 systolic  Eating small amts. Urinating during the day as usual and incontinent at night with adequate appearing volumes.   No fever or cough. No dysuria or frequency  8/16 BUN/CR 20.1/1.88 increased from 23/1.45 in March NA 137   8/16 WBC 4.4 Hgb 10.7 plt 220   Was treated for pna in June in rehab and returned to enhanced assisted living with goals of care established to be comfort based.  Past Medical History:  Diagnosis Date   Abdominal mass    AKI (acute kidney injury) ([place]) 09/15/2014   Atrial fibrillation (HCC)    Colitis, ulcerative (HCC)    Cough    WHITE SPUTUM FOR 2 WEEKS, NO FEVER   Degenerative joint disease    Diverticulosis    DVT (deep venous thrombosis) ([place]) 2009   BOTH LUNGS   GERD (gastroesophageal reflux disease)    Hyperlipidemia    Hyperplastic colon polyp    Hypertension    Hypothyroidism    Iron deficiency anemia    ON IRON AT TIMES   PE (pulmonary embolism) 2009   Persistent atrial fibrillation (HCC)    Positive H. pylori test 01/20/96   S/P dilatation of esophageal stricture 1992   Skin cancer    h/o basal and squamous skin cancer--> removed   Stroke [facility])    Past Surgical History:  Procedure Laterality Date   APPENDECTOMY     BREAST EXCISIONAL BIOPSY Left 1992   benign   BREAST LUMPECTOMY     benign   DILATION AND CURETTAGE OF UTERUS     for endometrial polyps   ENDOSCOPIC  RETROGRADE CHOLANGIOPANCREATOGRAPHY (ERCP) WITH PROPOFOL N/A 03/23/2015   Procedure: ENDOSCOPIC RETROGRADE CHOLANGIOPANCREATOGRAPHY (ERCP) WITH PROPOFOL;  Surgeon: Milus Banister, MD;  Location: [facility] ENDOSCOPY;  Service: Endoscopy;  Laterality: N/A;   JOINT REPLACEMENT     LOOP RECORDER INSERTION N/A 10/14/2017   Procedure: LOOP RECORDER INSERTION;  Surgeon: Thompson Grayer, MD;  Location: [place] CV LAB;  Service: Cardiovascular;  Laterality: N/A;   ORIF ANKLE FRACTURE Left    X 2   REPLACEMENT TOTAL KNEE     left   SURGERY DONE TO  REMOVE LEFT ANKLE HARDWARE LATER     TONSILLECTOMY      Allergies  Allergen Reactions   Iohexol Hives   Ivp Dye [Iodinated Diagnostic Agents] Hives    Was able to tolerate Contrast Media for CT scan.   Scallops [Shellfish Allergy] Nausea And Vomiting    Projectile vomiting   Shellfish-Derived Products Nausea And Vomiting   Sulfa Antibiotics Hives    Outpatient Encounter Medications as of 10/02/2020  Medication Sig   acetaminophen (TYLENOL) 325 MG tablet Take 2 tablets (650 mg total) by mouth in the morning and at bedtime.   amiodarone (PACERONE) 100 MG tablet Take 1 tablet (100 mg total) by mouth daily.   aspirin EC 81 MG EC tablet Take 1 tablet (81 mg total) by mouth daily. Swallow whole.   atorvastatin (LIPITOR) 10 MG tablet Take 10 mg by mouth daily.   Ca Phosphate-Cholecalciferol (CALTRATE GUMMY BITES PO) Take 2 tablets by mouth 2 (two) times daily.    carvedilol (COREG) 6.25 MG tablet Take 1 tablet (6.25 mg total) by mouth in the morning AND 0.5 tablets (3.125 mg total) every evening.   hydrALAZINE (APRESOLINE) 10 MG tablet Take 1 tablet (10 mg total) by mouth in the morning and at bedtime.   hydroxypropyl methylcellulose / hypromellose (GONIOVISC) 2.5 % ophthalmic solution Place 1 drop into both eyes every 8 (eight) hours as needed for dry eyes.   ipratropium-albuterol (DUONEB) 0.5-2.5 (3) MG/3ML SOLN Take 3 mLs by nebulization every 6 (six) hours as needed (SOB/Wheezing /cough).    lactose free nutrition (BOOST PLUS) LIQD Take 237 mLs by mouth daily.   levothyroxine (SYNTHROID) 75 MCG tablet Take 1 tablet (75 mcg total) by mouth daily before breakfast.   melatonin 10 MG TABS Take 10 mg by mouth at bedtime as needed (sleep).   ondansetron (ZOFRAN) 4 MG tablet Take 4 mg by mouth every 6 (six) hours as needed for nausea or vomiting.   pantoprazole (PROTONIX) 40 MG tablet Take 40 mg by mouth at bedtime.   senna (SENOKOT) 8.6 MG TABS tablet Take 1 tablet (8.6 mg total) by mouth daily  as needed for mild constipation.   Simethicone (GAS RELIEF PO) Take 1 tablet by mouth 2 (two) times daily.   tiZANidine (ZANAFLEX) 2 MG tablet Take 1 tablet (2 mg total) by mouth 4 (four) times daily -  with meals and at bedtime.   traMADol (ULTRAM) 50 MG tablet Take 1 tablet (50 mg total) by mouth 2 (two) times daily.   triamcinolone cream (KENALOG) 0.1 % Apply 1 application topically as needed. Apply thin layer to rash on back   WITCH HAZEL EX Apply 1 application topically 2 (two) times daily as needed (for comfort). Witch hazel pads   No facility-administered encounter medications on file as of 10/02/2020.    Review of Systems  Unable to perform ROS: Other   Immunization History  Administered Date(s) Administered   H1N1 03/04/2008  Influenza Split 11/12/2010, 11/26/2011   Influenza Whole 11/26/2006, 11/03/2007, 12/10/2007, 11/16/2008, 11/20/2009   Influenza, High Dose Seasonal PF 12/10/2018, 12/03/2019   Influenza,inj,Quad PF,6+ Mos 11/24/2012, 11/29/2013, 10/14/2014, 11/28/2015, 12/04/2016, 12/11/2017   Influenza-Unspecified 12/06/2019   Moderna SARS-COV2 Booster Vaccination 12/23/2019   Moderna Sars-Covid-2 Vaccination 02/20/2019, 03/24/2019   PPD Test 10/03/2014   Pneumococcal Conjugate-13 09/10/2013   Pneumococcal Polysaccharide-23 02/12/2000   Td 02/11/1998, 04/13/2008   Zoster, Live 05/08/2006   Pertinent  Health Maintenance Due  Topic Date Due   INFLUENZA VACCINE  09/11/2020   DEXA SCAN  Completed   PNA vac Low Risk Adult  Completed   Fall Risk  12/24/2019 10/20/2019 04/14/2019 09/16/2018 03/18/2018  Falls in the past year? - 0 0 0 0  Number falls in past yr: - 0 0 0 0  Comment - - - - -  Injury with Fall? - 0 0 0 0  Risk for fall due to : History of fall(s);Impaired balance/gait;Impaired mobility;Mental status change;Orthopedic patient History of fall(s);Impaired balance/gait;Impaired mobility;Mental status change - - -  Follow up Falls evaluation completed;Education  provided;Falls prevention discussed Falls evaluation completed;Education provided;Falls prevention discussed - - -   Functional Status Survey:    Vitals:   10/02/20 0946  BP: (!) 159/86  Pulse: 85  Resp: 20  Temp: 97.9 F (36.6 C)  SpO2: 94%  Weight: 135 lb 12.8 oz (61.6 kg)   Body mass index is 25.66 kg/m. Wt Readings from Last 3 Encounters:  10/02/20 135 lb 12.8 oz (61.6 kg)  09/12/20 135 lb 12.8 oz (61.6 kg)  07/24/20 135 lb 3.2 oz (61.3 kg)    Physical Exam Vitals and nursing note reviewed.  Constitutional:      General: She is not in acute distress.    Appearance: She is not diaphoretic.     Comments: Diaphoresis to back   HENT:     Head: Normocephalic and atraumatic.     Nose: Nose normal. No congestion.     Mouth/Throat:     Mouth: Mucous membranes are moist.     Pharynx: Oropharynx is clear.  Eyes:     Conjunctiva/sclera: Conjunctivae normal.     Pupils: Pupils are equal, round, and reactive to light.  Neck:     Vascular: No JVD.  Cardiovascular:     Rate and Rhythm: Normal rate. Rhythm irregular.     Heart sounds: No murmur heard. Pulmonary:     Effort: Pulmonary effort is normal. No respiratory distress.     Breath sounds: Normal breath sounds. No wheezing.  Abdominal:     General: Bowel sounds are normal. There is no distension.     Palpations: Abdomen is soft.     Tenderness: There is no abdominal tenderness. There is no right CVA tenderness or left CVA tenderness.  Musculoskeletal:     Cervical back: No rigidity or tenderness.     Right lower leg: No edema.     Left lower leg: No edema.  Lymphadenopathy:     Cervical: No cervical adenopathy.  Skin:    General: Skin is warm and dry.  Neurological:     Mental Status: She is alert.     Comments: Difficulty communicating with words and following commands. Has chronic right sided weakness which is unchanged.  No new focal deficit was noted.   Psychiatric:        Mood and Affect: Mood normal.     Labs reviewed: Recent Labs    11/27/19 0553 11/28/19 0504 11/29/19  4270 04/20/20 0000 07/20/20 0000 07/23/20 0000 09/26/20 0000  NA 139 140 138   < > 138 139 137  K 3.4* 4.1 3.8   < > 4.1 3.8 4.0  CL 109 114* 108   < > 104 108 100  CO2 19* 18* 17*   < > 24* 23* 22  GLUCOSE 104* 93 99  --   --   --   --   BUN 49* 29* 27*   < > 65* 39* 20  CREATININE 1.62* 1.25* 1.20*   < > 2.4* 1.6* 1.9*  CALCIUM 7.8* 8.0* 7.9*   < > 8.7 8.3* 8.7  MG 2.0 1.8 2.1  --   --   --   --   PHOS 2.2* 1.7* 2.9  --   --   --   --    < > = values in this interval not displayed.   Recent Labs    11/27/19 0553 11/28/19 0504 11/29/19 0441 04/20/20 0000 09/26/20 0000  AST 16 14* 16 17 18   ALT 10 10 11 11 9   ALKPHOS 103 95 97 114 94  BILITOT 0.4 0.5 0.4  --   --   PROT 5.8* 5.4* 5.4*  --   --   ALBUMIN 2.6* 2.4* 2.3*  --  3.9   Recent Labs    11/27/19 0553 11/28/19 0504 11/29/19 0441 07/20/20 0000 09/26/20 0000  WBC 10.2 7.1 7.3 11.7 4.4  NEUTROABS 8.2* 5.7 5.6 11.00  --   HGB 9.0* 8.5* 9.2* 9.4* 10.7*  HCT 28.2* 26.9* 28.4* 28* 32*  MCV 92.5 93.1 92.8  --   --   PLT 217 206 257 185 220   Lab Results  Component Value Date   TSH 0.94 07/13/2019   Lab Results  Component Value Date   HGBA1C 5.3 08/02/2013   Lab Results  Component Value Date   CHOL 92 11/27/2019   HDL 30 (L) 11/27/2019   LDLCALC 46 11/27/2019   LDLDIRECT 85 08/18/2008   TRIG 82 11/27/2019   CHOLHDL 3.1 11/27/2019    Significant Diagnostic Results in last 30 days:  CUP PACEART REMOTE DEVICE CHECK  Result Date: 09/12/2020 ILR summary report received. Battery status OK. Normal device function. No new symptom, tachy, brady, or pause episodes. No new AF episodes. Monthly summary reports and ROV/PRN   Assessment/Plan  1. Confusion She likely has underlying vascular dementia due to prior CVA which has worsened over time   2. Weakness Due to progressive dementia/CVA  3. Stage 3a chronic kidney disease  ([place]) Worsening due to decreased intake   4. Anemia, unspecified type Lab Results  Component Value Date   HGB 10.7 (A) 09/26/2020   Likely due to renal disease  Unchanged  5. Expressive aphasia Due to prior CVA   6. Hemiparesis of right dominant side as late effect of nontraumatic intracerebral hemorrhage (HCC) Progressively needing more help but the staff feel they can continue to provide care in the enhanced assisted living area.   After a discussion with Dr.Gupta regarding her functional decline and decreased appetite we have decided to refer to hospice. Her goals of care are comfort based and hospice may be an appropriate resource for her and her family. I attempted to call her son [name] but could not reach him. The nurse left him a message to return our call.    Family/ staff Communication: nurse left message for son Garth Schlatter ordered:  NA

## 2020-10-03 ENCOUNTER — Other Ambulatory Visit: Payer: Self-pay | Admitting: Orthopedic Surgery

## 2020-10-03 DIAGNOSIS — Z515 Encounter for palliative care: Secondary | ICD-10-CM

## 2020-10-03 MED ORDER — LORAZEPAM 2 MG/ML PO CONC
0.6000 mg | Freq: Four times a day (QID) | ORAL | 0 refills | Status: AC | PRN
Start: 2020-10-03 — End: ?

## 2020-10-03 MED ORDER — MORPHINE SULFATE (CONCENTRATE) 20 MG/ML PO SOLN: 5.0000 mg | ORAL | 0 refills | Status: AC | PRN

## 2020-10-04 DIAGNOSIS — I69351 Hemiplegia and hemiparesis following cerebral infarction affecting right dominant side: Secondary | ICD-10-CM | POA: Diagnosis not present

## 2020-10-04 DIAGNOSIS — H04129 Dry eye syndrome of unspecified lacrimal gland: Secondary | ICD-10-CM | POA: Diagnosis not present

## 2020-10-04 DIAGNOSIS — I129 Hypertensive chronic kidney disease with stage 1 through stage 4 chronic kidney disease, or unspecified chronic kidney disease: Secondary | ICD-10-CM | POA: Diagnosis not present

## 2020-10-04 DIAGNOSIS — N183 Chronic kidney disease, stage 3 unspecified: Secondary | ICD-10-CM | POA: Diagnosis not present

## 2020-10-04 DIAGNOSIS — I4891 Unspecified atrial fibrillation: Secondary | ICD-10-CM | POA: Diagnosis not present

## 2020-10-04 DIAGNOSIS — Z8781 Personal history of (healed) traumatic fracture: Secondary | ICD-10-CM | POA: Diagnosis not present

## 2020-10-04 DIAGNOSIS — Z6823 Body mass index (BMI) 23.0-23.9, adult: Secondary | ICD-10-CM | POA: Diagnosis not present

## 2020-10-04 DIAGNOSIS — E785 Hyperlipidemia, unspecified: Secondary | ICD-10-CM | POA: Diagnosis not present

## 2020-10-04 DIAGNOSIS — Z86718 Personal history of other venous thrombosis and embolism: Secondary | ICD-10-CM | POA: Diagnosis not present

## 2020-10-04 DIAGNOSIS — K219 Gastro-esophageal reflux disease without esophagitis: Secondary | ICD-10-CM | POA: Diagnosis not present

## 2020-10-04 DIAGNOSIS — L309 Dermatitis, unspecified: Secondary | ICD-10-CM | POA: Diagnosis not present

## 2020-10-04 DIAGNOSIS — Z9981 Dependence on supplemental oxygen: Secondary | ICD-10-CM | POA: Diagnosis not present

## 2020-10-04 DIAGNOSIS — D631 Anemia in chronic kidney disease: Secondary | ICD-10-CM | POA: Diagnosis not present

## 2020-10-04 DIAGNOSIS — Z87440 Personal history of urinary (tract) infections: Secondary | ICD-10-CM | POA: Diagnosis not present

## 2020-10-04 DIAGNOSIS — K649 Unspecified hemorrhoids: Secondary | ICD-10-CM | POA: Diagnosis not present

## 2020-10-04 DIAGNOSIS — I69318 Other symptoms and signs involving cognitive functions following cerebral infarction: Secondary | ICD-10-CM | POA: Diagnosis not present

## 2020-10-05 DIAGNOSIS — I69351 Hemiplegia and hemiparesis following cerebral infarction affecting right dominant side: Secondary | ICD-10-CM | POA: Diagnosis not present

## 2020-10-05 DIAGNOSIS — I129 Hypertensive chronic kidney disease with stage 1 through stage 4 chronic kidney disease, or unspecified chronic kidney disease: Secondary | ICD-10-CM | POA: Diagnosis not present

## 2020-10-05 DIAGNOSIS — E785 Hyperlipidemia, unspecified: Secondary | ICD-10-CM | POA: Diagnosis not present

## 2020-10-05 DIAGNOSIS — I4891 Unspecified atrial fibrillation: Secondary | ICD-10-CM | POA: Diagnosis not present

## 2020-10-05 DIAGNOSIS — I69318 Other symptoms and signs involving cognitive functions following cerebral infarction: Secondary | ICD-10-CM | POA: Diagnosis not present

## 2020-10-05 DIAGNOSIS — N183 Chronic kidney disease, stage 3 unspecified: Secondary | ICD-10-CM | POA: Diagnosis not present

## 2020-10-12 DEATH — deceased

## 2020-10-25 ENCOUNTER — Encounter: Payer: Self-pay | Admitting: Internal Medicine

## 2021-05-07 ENCOUNTER — Encounter: Payer: Medicare Other | Admitting: Internal Medicine
